# Patient Record
Sex: Female | Born: 1945
Health system: Southern US, Community
[De-identification: ages and names within clinical notes are randomized; demographics above are authoritative.]

## PROBLEM LIST (undated history)

## (undated) DIAGNOSIS — K922 Gastrointestinal hemorrhage, unspecified: Secondary | ICD-10-CM

## (undated) DIAGNOSIS — H3530 Unspecified macular degeneration: Secondary | ICD-10-CM

## (undated) DIAGNOSIS — H269 Unspecified cataract: Secondary | ICD-10-CM

## (undated) DIAGNOSIS — N289 Disorder of kidney and ureter, unspecified: Secondary | ICD-10-CM

## (undated) DIAGNOSIS — E785 Hyperlipidemia, unspecified: Secondary | ICD-10-CM

## (undated) DIAGNOSIS — C801 Malignant (primary) neoplasm, unspecified: Secondary | ICD-10-CM

## (undated) DIAGNOSIS — Z98891 History of uterine scar from previous surgery: Secondary | ICD-10-CM

## (undated) DIAGNOSIS — C189 Malignant neoplasm of colon, unspecified: Secondary | ICD-10-CM

## (undated) DIAGNOSIS — M1990 Unspecified osteoarthritis, unspecified site: Secondary | ICD-10-CM

## (undated) DIAGNOSIS — Z9089 Acquired absence of other organs: Secondary | ICD-10-CM

## (undated) DIAGNOSIS — I10 Essential (primary) hypertension: Secondary | ICD-10-CM

## (undated) HISTORY — DX: History of uterine scar from previous surgery: Z98.891

## (undated) HISTORY — PX: TONSILLECTOMY: SUR1361

## (undated) HISTORY — DX: Essential (primary) hypertension: I10

## (undated) HISTORY — DX: Gastrointestinal hemorrhage, unspecified: K92.2

## (undated) HISTORY — DX: Hyperlipidemia, unspecified: E78.5

## (undated) HISTORY — DX: Unspecified macular degeneration: H35.30

## (undated) HISTORY — PX: CARPAL TUNNEL RELEASE: SHX101

## (undated) HISTORY — DX: Acquired absence of other organs: Z90.89

## (undated) HISTORY — DX: Unspecified cataract: H26.9

## (undated) SURGERY — PARS PLANA VITRECTOMY WITH 25 GAUGE
Anesthesia: General | Laterality: Left

---

## 2002-03-26 ENCOUNTER — Other Ambulatory Visit: Admission: RE | Admit: 2002-03-26 | Discharge: 2002-03-26 | Payer: Self-pay | Admitting: Family Medicine

## 2017-12-07 NOTE — Progress Notes (Signed)
[name] Note  12/08/2017     CHIEF COMPLAINT Patient presents for Retina Evaluation and Spots and/or Floaters   HISTORY OF PRESENT ILLNESS: Gabrielle [name] is a 72 y.o. female who presents to the clinic today for:   HPI    Retina Evaluation    In both eyes.  This started 1 month ago.  Associated Symptoms Floaters, Glare and Photophobia.  Negative for Blind Spot, Shoulder/Hip pain, Distortion, Jaw Claudication, Fatigue, Weight Loss, Scalp Tenderness, Redness, Flashes, Pain, Trauma and Fever.  Context:  distance vision, mid-range vision and near vision.  Treatments tried include no treatments.  I, the attending physician,  performed the HPI with the patient and updated documentation appropriately.          Spots and/or Floaters    In both eyes.  Associated symptoms include photophobia.  Negative for flashes.  I, the attending physician,  performed the HPI with the patient and updated documentation appropriately.          Comments    Referral of Gabrielle [name] of [facility] [name] for evaluation of Mac Deg. Patient states she noticed occasional  Floaters and glare  about a week ago Ou . Pt states "I'M blind in Gabrielle right eye, everything is blurry". Pt reports she sees "a white ball " OU. Denies pain, flashes and trauma. Denies eye gtts/vits Pt reports she does not take any medications .         Last edited by Gabrielle [name] on 12/09/2017  8:21 PM. (History)    Pt states 1 month ago she began to notice a decrease in OU [place]; pt reports it "took awhile to get an appointment", pt states that she was seen yesterday; Pt states that she began to notice trouble seeing TV and the phone book; Pt reports family hx of ARMD; Pt denies ever having dx of ARMD; Pt denies DM dx;   Referring physician: Melina [name], [place], Gabrielle [name]  HISTORICAL INFORMATION:   Selected notes from the MEDICAL RECORD NUMBER Referred by Gabrielle [name] for concern of ARMD OU;  LEE-  01.03.19 Gabrielle [name]) [BCVA OD: 10/50 OS: 20/80] Ocular Hx- ARMD OU, DME PMH- elevated chol., HTN    CURRENT MEDICATIONS: No current outpatient medications on file. (Ophthalmic Drugs)   No current facility-administered medications for this visit.  (Ophthalmic Drugs)   No current outpatient medications on file. (Other)   Current Facility-Administered Medications (Other)  Medication Route  . Bevacizumab (AVASTIN) SOLN 1.25 mg Intravitreal      REVIEW OF SYSTEMS: ROS    Positive for: Eyes   Negative for: Constitutional, Gastrointestinal, Neurological, Skin, Genitourinary, Musculoskeletal, HENT, Endocrine, Cardiovascular, Respiratory, Psychiatric, Allergic/Imm, Heme/Lymph   Last edited by Gabrielle Jordan, Gabrielle [name] on 07/09/1323  4:01 AM. (History)       ALLERGIES Not on File  PAST MEDICAL HISTORY Past Medical History:  Diagnosis Date  . H/O cesarean section   . Hx of tonsillectomy    History reviewed. No pertinent surgical history.  FAMILY HISTORY Family History  Problem Relation Age of Onset  . Macular degeneration Mother   . Stroke Mother   . Hypertension Mother   . Cancer Father        lung  . Diabetes Sister   . Hypertension Sister     SOCIAL HISTORY Social History   Tobacco Use  . Smoking status: Never Smoker  . Smokeless tobacco: Never Used  Substance Use Topics  .  Alcohol use: No    Frequency: Never  . Drug use: No         OPHTHALMIC EXAM:  Base Eye Exam    Visual Acuity (Snellen - Linear)      Right Left   Dist cc 20/200 20/70 -1   Dist ph cc NI NI   Correction:  Glasses       Tonometry (Tonopen, 8:37 AM)      Right Left   Pressure 24 18       Tonometry #2 (Tonopen, 8:37 AM)      Right Left   Pressure 25 19       Pupils      Dark Light Shape React APD   Right 5 4 Round Slow None   Left 5 4 Round Slow None       Visual Fields (Counting fingers)      Left Right    Full    Restrictions  Central scotoma       Extraocular  Movement      Right Left    Full, Ortho Full, Ortho       Neuro/Psych    Oriented x3:  Yes   Mood/Affect:  Normal       Dilation    Both eyes:  1.0% Mydriacyl, 2.5% Phenylephrine @ 8:37 AM        Slit Lamp and Fundus Exam    Slit Lamp Exam      Right Left   Lids/Lashes Dermatochalasis - upper lid Dermatochalasis - upper lid   Conjunctiva/Sclera White and quiet White and quiet   Cornea Arcus, 1+ Punctate epithelial erosions Arcus, 2+ Punctate epithelial erosions   Anterior Chamber Deep and quiet Deep and quiet   Iris Round and well dilated Round and well dilated   Lens 2-3+ Nuclear sclerosis, 2+ Cortical cataract, trace Posterior subcapsular cataract centrally 2-3+ Nuclear sclerosis, 2+ Cortical cataract, trace Posterior subcapsular cataract centrally   Vitreous Vitreous syneresis, Posterior vitreous detachment Vitreous syneresis, Posterior vitreous detachment       Fundus Exam      Right Left   Disc Normal Normal   C/D Ratio 0.35 0.35   Macula Blunted foveal reflex, Drusen, Pigment clumping, central thickening, scattered Exudates Blunted foveal reflex, central thickening, hemorrhage along inferior boarder of thickening, RPE clumping, Drusen   Vessels Vascular attenuation Vascular attenuation   Periphery Attached, mild Reticular degeneration Attached, mild Reticular degeneration        Refraction    Wearing Rx      Sphere Cylinder Axis Add   Right -3.25 +1.00 158 +2.50   Left -3.50 +0.50 180 +2.50   Age:  8 yrs   Type:  PAL       Manifest Refraction (Over)      Sphere Cylinder Axis Dist VA   Right -2.25 +1.00 157 20/200   Left -2.50 +0.50 180 20/70          IMAGING AND PROCEDURES  Imaging and Procedures for 12/09/17  OCT, Retina - OU - Both Eyes     Right Eye Quality was good. Central Foveal Thickness: 591. Progression has no prior data. Findings include subretinal fluid, intraretinal fluid, pigment epithelial detachment, epiretinal membrane, subretinal  hyper-reflective material, retinal drusen .   Left Eye Quality was good. Central Foveal Thickness: 444. Progression has no prior data. Findings include subretinal hyper-reflective material, pigment epithelial detachment, subretinal fluid, intraretinal fluid, lamellar hole, retinal drusen .   Notes *Images captured and stored on  drive  Diagnosis / Impression:  Exudative AMD OU  Clinical management:  See below  Abbreviations: NFP - Normal foveal profile. CME - cystoid macular edema. PED - pigment epithelial detachment. IRF - intraretinal fluid. SRF - subretinal fluid. EZ - ellipsoid zone. ERM - epiretinal membrane. ORA - outer retinal atrophy. ORT - outer retinal tubulation. SRHM - subretinal hyper-reflective material        Intravitreal Injection, Pharmacologic Agent - OD - Right Eye     Time Out 12/08/2017. 9:55 AM. Confirmed correct patient, procedure, site, and patient consented.   Anesthesia Topical anesthesia was used. Anesthetic medications included Lidocaine 2%, Tetracaine 0.5%.   Procedure Preparation included 5% betadine to ocular surface, eyelid speculum. A supplied needle was used.   Injection: 1.25 mg Bevacizumab 1.65m/0.05ml   NDC: 523557-322-02   Lot: [id number]    Expiration Date: 01/09/2018   Route: Intravitreal   Site: Right Eye   Waste: 0 mg  Post-op Post injection exam found visual acuity of at least counting fingers. The patient tolerated the procedure well. There were no complications. The patient received written and verbal post procedure care education.                 ASSESSMENT/PLAN:    ICD-10-CM   1. Exudative age-related macular degeneration of both eyes with active choroidal neovascularization (HCC) H35.3231 Intravitreal Injection, Pharmacologic Agent - OD - Right Eye  2. Retinal edema H35.81 OCT, Retina - OU - Both Eyes    Intravitreal Injection, Pharmacologic Agent - OD - Right Eye    Bevacizumab (AVASTIN) SOLN 1.25 mg  3.  Posterior vitreous detachment of both eyes H43.813   4. Combined form of age-related cataract, both eyes H25.813     1,2. Exudative age related macular degeneration, both eyes.    - The incidence pathology and anatomy of wet AMD discussed   - The ANCHOR, MARINA, CATT and VIEW trials discussed with patient.    - discussed treatment options including observation vs intravitreal anti-VEGF agents such as Avastin, Lucentis, Eylea.    - Risks of endophthalmitis and vascular occlusive events and atrophic changes discussed with patient  - OCT shows SRF, IRF, PED OU consistent with active exudative ARMD OU with retinal edema  - recommend IVA OD today and IVA OS in 2 wks  - pt wishes to be treated with IVA OD, today (01.04.19)  - RBA of procedure discussed, questions answered  - informed consent obtained and signed  - see procedure note  - f/u in 2 wks for IVA OS  - if pt tolerates procedures well, will consider bilateral injections at subsequent follow ups  3. PVD / vitreous syneresis  Discussed findings and prognosis  No RT or RD on 360  exam  Reviewed s/s of RT/RD  Strict return precautions for any such RT/RD signs/symptoms  4. Combined form of age related cataract OU- - The symptoms of cataract, surgical options, and treatments and risks were discussed with patient. - discussed diagnosis and progression - not yet visually significant - monitor for now   Ophthalmic Meds Ordered this visit:  Meds ordered this encounter  Medications  . Bevacizumab (AVASTIN) SOLN 1.25 mg       Return in about 2 weeks (around 12/22/2017) for IVA OS only.  There are no Patient Instructions on file for this visit.   Explained the diagnoses, plan, and follow up with the patient and they expressed understanding.  Patient expressed understanding of the importance of proper follow  up care.   This document serves as a record of services personally performed by Gardiner Sleeper, MD, PhD. It was created on  their behalf by Catha Brow, [place], a certified ophthalmic assistant. The creation of this record is the provider's dictation and/or activities during the visit.  Electronically signed by: Catha Brow, COA  12/09/17 8:29 PM    Gardiner Sleeper, M.D., Ph.D. Diseases & Surgery of the Retina and Vitreous [facility] 12/09/17   I have reviewed the above documentation for accuracy and completeness, and I agree with the above. Gardiner Sleeper, M.D., Ph.D. 12/09/17 8:30 PM     Abbreviations: M myopia (nearsighted); A astigmatism; H hyperopia (farsighted); P presbyopia; Mrx spectacle prescription;  CTL contact lenses; OD right eye; OS left eye; OU both eyes  XT exotropia; ET esotropia; PEK punctate epithelial keratitis; PEE punctate epithelial erosions; DES dry eye syndrome; MGD meibomian gland dysfunction; ATs artificial tears; PFAT's preservative free artificial tears; [place] nuclear sclerotic cataract; PSC posterior subcapsular cataract; ERM epi-retinal membrane; PVD posterior vitreous detachment; RD retinal detachment; DM diabetes mellitus; [name] diabetic retinopathy; NPDR non-proliferative diabetic retinopathy; PDR proliferative diabetic retinopathy; CSME clinically significant macular edema; DME diabetic macular edema; dbh dot blot hemorrhages; CWS cotton wool spot; POAG primary open angle glaucoma; C/D cup-to-disc ratio; HVF humphrey visual field; GVF goldmann visual field; OCT optical coherence tomography; IOP intraocular pressure; BRVO Branch retinal vein occlusion; CRVO central retinal vein occlusion; CRAO central retinal artery occlusion; BRAO branch retinal artery occlusion; RT retinal tear; SB scleral buckle; PPV pars plana vitrectomy; VH Vitreous hemorrhage; PRP panretinal laser photocoagulation; IVK intravitreal kenalog; VMT vitreomacular traction; MH Macular hole;  NVD neovascularization of the disc; NVE neovascularization elsewhere; AREDS age related eye disease  study; ARMD age related macular degeneration; POAG primary open angle glaucoma; EBMD epithelial/anterior basement membrane dystrophy; ACIOL anterior chamber intraocular lens; IOL intraocular lens; PCIOL posterior chamber intraocular lens; Phaco/IOL phacoemulsification with intraocular lens placement; [place] photorefractive keratectomy; LASIK laser assisted in situ keratomileusis; HTN hypertension; DM diabetes mellitus; COPD chronic obstructive pulmonary disease

## 2017-12-08 ENCOUNTER — Encounter (INDEPENDENT_AMBULATORY_CARE_PROVIDER_SITE_OTHER): Payer: Self-pay | Admitting: Ophthalmology

## 2017-12-08 ENCOUNTER — Ambulatory Visit (INDEPENDENT_AMBULATORY_CARE_PROVIDER_SITE_OTHER): Payer: Medicare HMO | Admitting: Ophthalmology

## 2017-12-08 DIAGNOSIS — H25813 Combined forms of age-related cataract, bilateral: Secondary | ICD-10-CM | POA: Diagnosis not present

## 2017-12-08 DIAGNOSIS — H3581 Retinal edema: Secondary | ICD-10-CM | POA: Diagnosis not present

## 2017-12-08 DIAGNOSIS — H43813 Vitreous degeneration, bilateral: Secondary | ICD-10-CM

## 2017-12-08 DIAGNOSIS — H353231 Exudative age-related macular degeneration, bilateral, with active choroidal neovascularization: Secondary | ICD-10-CM | POA: Diagnosis not present

## 2017-12-08 MED ORDER — BEVACIZUMAB CHEMO INJECTION 1.25MG/0.05ML SYRINGE FOR KALEIDOSCOPE
1.2500 mg | INTRAVITREAL | Status: DC
  Administered 2017-12-08: 1.25 mg via INTRAVITREAL

## 2017-12-09 ENCOUNTER — Encounter (INDEPENDENT_AMBULATORY_CARE_PROVIDER_SITE_OTHER): Payer: Self-pay | Admitting: Ophthalmology

## 2017-12-21 NOTE — Progress Notes (Signed)
[facility] Note  12/22/2017     CHIEF COMPLAINT Patient presents for Retina Follow Up   HISTORY OF PRESENT ILLNESS: Gabrielle [name] is a 72 y.o. female who presents to the clinic today for:   HPI    Retina Follow Up    Patient presents with  Other.  In right eye.  This started 2 months ago.  Severity is mild.  Since onset it is stable.  I, the attending physician,  performed the HPI with the patient and updated documentation appropriately.          Comments    F/U EXU AMD OU. Patient states "when she watches t.v she feels like a bottle of light is in her vision". Light is constant in her OD. Pt reports she has glare OD when she has her glasses on.Denies flashes, floaters and ocular pain.  Denies Vits/ Gtts        Last edited by Gabrielle Caffey, MD on 12/22/2017  9:39 AM. (History)      Referring physician: No referring provider defined for this encounter.  HISTORICAL INFORMATION:   Selected notes from the MEDICAL RECORD NUMBER Referred by Dr. Cristela [name] for concern of ARMD OU;  LEE- 01.03.19 Gabrielle [name]) [BCVA OD: 10/50 OS: 20/80] Ocular Hx- ARMD OU, DME PMH- elevated chol., HTN    CURRENT MEDICATIONS: No current outpatient medications on file. (Ophthalmic Drugs)   No current facility-administered medications for this visit.  (Ophthalmic Drugs)   No current outpatient medications on file. (Other)   Current Facility-Administered Medications (Other)  Medication Route  . Bevacizumab (AVASTIN) SOLN 1.25 mg Intravitreal  . Bevacizumab (AVASTIN) SOLN 1.25 mg Intravitreal      REVIEW OF SYSTEMS: ROS    Positive for: Eyes   Negative for: Constitutional, Gastrointestinal, Neurological, Skin, Genitourinary, Musculoskeletal, HENT, Endocrine, Cardiovascular, Respiratory, Psychiatric, Allergic/Imm, Heme/Lymph   Last edited by Gabrielle Jordan, LPN on 7/61/9509  3:26 AM. (History)       ALLERGIES No Known Allergies  PAST MEDICAL HISTORY Past  Medical History:  Diagnosis Date  . H/O cesarean section   . Hx of tonsillectomy    History reviewed. No pertinent surgical history.  FAMILY HISTORY Family History  Problem Relation Age of Onset  . Macular degeneration Mother   . Stroke Mother   . Hypertension Mother   . Cancer Father        lung  . Diabetes Sister   . Hypertension Sister     SOCIAL HISTORY Social History   Tobacco Use  . Smoking status: Never Smoker  . Smokeless tobacco: Never Used  Substance Use Topics  . Alcohol use: No    Frequency: Never  . Drug use: No         OPHTHALMIC EXAM:  Base Eye Exam    Visual Acuity (Snellen - Linear)      Right Left   Dist cc 20/300 -2 20/100 -2   Dist ph cc NI NI   Correction:  Glasses       Tonometry (Tonopen, 8:33 AM)      Right Left   Pressure 22 19       Pupils      Dark Light Shape React APD   Right 5 4 Round Slow None   Left 5 4 Round Slow None       Visual Fields      Left Right    Full    Restrictions  Central scotoma  Extraocular Movement      Right Left    Full, Ortho Full, Ortho       Neuro/Psych    Oriented x3:  Yes   Mood/Affect:  Normal       Dilation    Both eyes:  1.0% Mydriacyl, 2.5% Phenylephrine @ 8:31 AM        Slit Lamp and Fundus Exam    Slit Lamp Exam      Right Left   Lids/Lashes Dermatochalasis - upper lid Dermatochalasis - upper lid   Conjunctiva/Sclera White and quiet White and quiet   Cornea Arcus, 1+ Punctate epithelial erosions Arcus, 2+ Punctate epithelial erosions   Anterior Chamber Deep and quiet Deep and quiet   Iris Round and well dilated Round and well dilated   Lens 2-3+ Nuclear sclerosis, 2+ Cortical cataract, trace Posterior subcapsular cataract centrally 2-3+ Nuclear sclerosis, 2+ Cortical cataract, trace Posterior subcapsular cataract centrally   Vitreous Vitreous syneresis, Posterior vitreous detachment Vitreous syneresis, Posterior vitreous detachment       Fundus Exam      Right  Left   Disc Normal Normal   C/D Ratio 0.35 0.35   Macula Blunted foveal reflex, Drusen, Pigment clumping, central thickening with mild interval improvement, scattered Exudates Blunted foveal reflex, central thickening slightly worse, hemorrhage along inferior boarder of thickening, RPE clumping, Drusen   Vessels Vascular attenuation Vascular attenuation   Periphery Attached, mild Reticular degeneration Attached, mild Reticular degeneration        Refraction    Wearing Rx      Sphere Cylinder Axis Add   Right -3.25 +1.00 158 +2.50   Left -3.50 +0.50 180 +2.50   Type:  PAL       Manifest Refraction      Dist VA   Right 20/300   Left 20/100+2          IMAGING AND PROCEDURES  Imaging and Procedures for 12/22/17  OCT, Retina - OU - Both Eyes     Right Eye Quality was good. Central Foveal Thickness: 532. Progression has improved. Findings include subretinal fluid, intraretinal fluid, pigment epithelial detachment, epiretinal membrane, subretinal hyper-reflective material, retinal drusen  (OD with interval improvement).   Left Eye Quality was good. Central Foveal Thickness: 521. Progression has worsened. Findings include subretinal hyper-reflective material, pigment epithelial detachment, subretinal fluid, intraretinal fluid, lamellar hole, retinal drusen  (OS interval worsening).   Notes *Images captured and stored on drive  Diagnosis / Impression:  Exudative AMD OU OD with interval improvement  OS interval worsening  Clinical management:  See below  Abbreviations: NFP - Normal foveal profile. CME - cystoid macular edema. PED - pigment epithelial detachment. IRF - intraretinal fluid. SRF - subretinal fluid. EZ - ellipsoid zone. ERM - epiretinal membrane. ORA - outer retinal atrophy. ORT - outer retinal tubulation. SRHM - subretinal hyper-reflective material        Intravitreal Injection, Pharmacologic Agent - OS - Left Eye     Time Out 12/22/2017. 9:53 AM. Confirmed  correct patient, procedure, site, and patient consented.   Anesthesia Topical anesthesia was used. Anesthetic medications included Tetracaine 0.5%, Lidocaine 2%.   Procedure Preparation included 5% betadine to ocular surface, eyelid speculum. A supplied needle was used.   Injection: 1.25 mg Bevacizumab 1.25mg /0.34ml   NDC: 29924-268-34    Lot: [id number]     Expiration Date: 01/08/2018   Route: Intravitreal   Site: Left Eye   Waste: 0 mg  Post-op Post injection exam found visual acuity of  at least counting fingers. The patient tolerated the procedure well. There were no complications. The patient received written and verbal post procedure care education.                 ASSESSMENT/PLAN:    ICD-10-CM   1. Exudative age-related macular degeneration of both eyes with active choroidal neovascularization (HCC) H35.3231 OCT, Retina - OU - Both Eyes    Intravitreal Injection, Pharmacologic Agent - OS - Left Eye    Bevacizumab (AVASTIN) SOLN 1.25 mg    CANCELED: Intravitreal Injection, Pharmacologic Agent - OD - Right Eye  2. Retinal edema H35.81 OCT, Retina - OU - Both Eyes  3. Posterior vitreous detachment of both eyes H43.813   4. Combined form of age-related cataract, both eyes H25.813     1,2. Exudative age related macular degeneration, both eyes.    - The incidence pathology and anatomy of wet AMD discussed   - The ANCHOR, MARINA, CATT and VIEW trials discussed with patient.    - discussed treatment options including observation vs intravitreal anti-VEGF agents such as Avastin, Lucentis, Eylea.    - Risks of endophthalmitis and vascular occlusive events and atrophic changes discussed with patient  - S/P IVA OD #1 (01.04.19)  - OCT today shows interval improvement OD, interval worsening of SRF, IRF, PED OS  - recommend IVA OS #1 today  - pt wishes to be treated with IVA OS, today (1.18.19)  - RBA of procedure discussed, questions answered  - informed consent obtained and  signed  - see procedure note  - f/u in 4 wks for IVA OU  3. PVD / vitreous syneresis  Discussed findings and prognosis  No RT or RD on 360  exam  Reviewed s/s of RT/RD  Strict return precautions for any such RT/RD signs/symptoms  4. Combined form of age related cataract OU- - The symptoms of cataract, surgical options, and treatments and risks were discussed with patient. - discussed diagnosis and progression - not yet visually significant - monitor for now   Ophthalmic Meds Ordered this visit:  Meds ordered this encounter  Medications  . Bevacizumab (AVASTIN) SOLN 1.25 mg       Return in about 4 weeks (around 01/19/2018) for Dilated Exam, OCT.  There are no Patient Instructions on file for this visit.   Explained the diagnoses, plan, and follow up with the patient and they expressed understanding.  Patient expressed understanding of the importance of proper follow up care.   This document serves as a record of services personally performed by Gardiner Sleeper, MD, PhD. It was created on their behalf by Catha Brow, [place], a certified ophthalmic assistant. The creation of this record is the provider's dictation and/or activities during the visit.  Electronically signed by: Catha Brow, COA  12/22/17 10:14 PM     Gardiner Sleeper, M.D., Ph.D. Diseases & Surgery of the Retina and [place] 12/22/17   I have reviewed the above documentation for accuracy and completeness, and I agree with the above. Gardiner Sleeper, M.D., Ph.D. 12/22/17 10:17 PM      Abbreviations: M myopia (nearsighted); A astigmatism; H hyperopia (farsighted); P presbyopia; Mrx spectacle prescription;  CTL contact lenses; OD right eye; OS left eye; OU both eyes  XT exotropia; ET esotropia; PEK punctate epithelial keratitis; PEE punctate epithelial erosions; DES dry eye syndrome; MGD meibomian gland dysfunction; ATs artificial tears; PFAT's preservative free  artificial tears; [place] nuclear sclerotic cataract; [place]  posterior subcapsular cataract; ERM epi-retinal membrane; PVD posterior vitreous detachment; RD retinal detachment; DM diabetes mellitus; DR diabetic retinopathy; NPDR non-proliferative diabetic retinopathy; PDR proliferative diabetic retinopathy; CSME clinically significant macular edema; DME diabetic macular edema; dbh dot blot hemorrhages; CWS cotton wool spot; POAG primary open angle glaucoma; C/D cup-to-disc ratio; HVF humphrey visual field; GVF goldmann visual field; OCT optical coherence tomography; IOP intraocular pressure; BRVO Branch retinal vein occlusion; CRVO central retinal vein occlusion; CRAO central retinal artery occlusion; BRAO branch retinal artery occlusion; RT retinal tear; SB scleral buckle; PPV pars plana vitrectomy; VH Vitreous hemorrhage; PRP panretinal laser photocoagulation; IVK intravitreal kenalog; VMT vitreomacular traction; MH Macular hole;  NVD neovascularization of the disc; NVE neovascularization elsewhere; AREDS age related eye disease study; ARMD age related macular degeneration; POAG primary open angle glaucoma; EBMD epithelial/anterior basement membrane dystrophy; ACIOL anterior chamber intraocular lens; IOL intraocular lens; PCIOL posterior chamber intraocular lens; Phaco/IOL phacoemulsification with intraocular lens placement; [name] photorefractive keratectomy; LASIK laser assisted in situ keratomileusis; HTN hypertension; DM diabetes mellitus; COPD chronic obstructive pulmonary disease

## 2017-12-22 ENCOUNTER — Ambulatory Visit (INDEPENDENT_AMBULATORY_CARE_PROVIDER_SITE_OTHER): Payer: Medicare HMO | Admitting: Ophthalmology

## 2017-12-22 ENCOUNTER — Encounter (INDEPENDENT_AMBULATORY_CARE_PROVIDER_SITE_OTHER): Payer: Self-pay | Admitting: Ophthalmology

## 2017-12-22 DIAGNOSIS — H353231 Exudative age-related macular degeneration, bilateral, with active choroidal neovascularization: Secondary | ICD-10-CM

## 2017-12-22 DIAGNOSIS — H25813 Combined forms of age-related cataract, bilateral: Secondary | ICD-10-CM

## 2017-12-22 DIAGNOSIS — H3581 Retinal edema: Secondary | ICD-10-CM

## 2017-12-22 DIAGNOSIS — H43813 Vitreous degeneration, bilateral: Secondary | ICD-10-CM

## 2017-12-22 MED ORDER — BEVACIZUMAB CHEMO INJECTION 1.25MG/0.05ML SYRINGE FOR KALEIDOSCOPE
1.2500 mg | INTRAVITREAL | Status: DC
  Administered 2017-12-22: 1.25 mg via INTRAVITREAL

## 2018-01-02 ENCOUNTER — Encounter: Payer: Self-pay | Admitting: *Deleted

## 2018-01-03 ENCOUNTER — Ambulatory Visit (INDEPENDENT_AMBULATORY_CARE_PROVIDER_SITE_OTHER): Payer: Medicare HMO | Admitting: Pediatrics

## 2018-01-03 ENCOUNTER — Encounter: Payer: Self-pay | Admitting: Pediatrics

## 2018-01-03 VITALS — BP 211/111 | HR 85 | Temp 97.9°F | Ht 62.0 in | Wt 160.2 lb

## 2018-01-03 DIAGNOSIS — I10 Essential (primary) hypertension: Secondary | ICD-10-CM

## 2018-01-03 DIAGNOSIS — H3530 Unspecified macular degeneration: Secondary | ICD-10-CM | POA: Diagnosis not present

## 2018-01-03 DIAGNOSIS — E785 Hyperlipidemia, unspecified: Secondary | ICD-10-CM | POA: Diagnosis not present

## 2018-01-03 LAB — BAYER DCA HB A1C WAIVED: HB A1C (BAYER DCA - WAIVED): 6 %

## 2018-01-03 MED ORDER — LISINOPRIL 10 MG PO TABS: 10.0000 mg | ORAL_TABLET | Freq: Every day | ORAL | 3 refills | Status: DC

## 2018-01-03 NOTE — Progress Notes (Signed)
Subjective:   Patient ID: Gabrielle [name], female    DOB: [birth year], 72 y.o.   MRN: [id number] CC: New Patient (Initial Visit) follow up multiple med problems HPI: Gabrielle [name] is a 72 y.o. female presenting for New Patient (Initial Visit)  Patient here to establish care and follow-up multiple medical problems. Has not seen a primary care doctor for years.  Saw an eye doctor 2 weeks ago.  Diagnosed with macular degeneration.  Patient says her R eye is worse than left.  Cannot read signs, cannot read noticed on the back of the clinic door.  Patient is not driving now. Cant read without a magnifying glass.   Here today with her sister.  HTN: was on medicines years ago, 10 or more years ago.  Not taking anything now.  She does have headaches some days, general headaches, not one place more than another.  Last for a few hours and go away.  Has had trouble with her vision for the last 2-3 months, no recent worsening.  No chest pain, no chest pressure.  No numbness or tingling, no weakness in her arms or legs.  Sister is a blood pressure cuff at home.  They have not been checking patient's blood pressure.  HLD: on medicines about 10 yrs  Family history of diabetes, thyroid problems and hypertension.  Past Medical History:  Diagnosis Date  . H/O cesarean section   . Hx of tonsillectomy   . Hyperlipidemia   . Hypertension    Family History  Problem Relation Age of Onset  . Macular degeneration Mother   . Stroke Mother   . Hypertension Mother   . Cancer Father        lung  . Diabetes Sister   . Hypertension Sister    Social History   Socioeconomic History  . Marital status: Divorced    Spouse name: None  . Number of children: None  . Years of education: None  . Highest education level: None  Social Needs  . Financial resource strain: None  . Food insecurity - worry: None  . Food insecurity - inability: None  . Transportation needs - medical: None  . Transportation needs -  non-medical: None  Occupational History  . None  Tobacco Use  . Smoking status: Never Smoker  . Smokeless tobacco: Never Used  Substance and Sexual Activity  . Alcohol use: No    Frequency: Never  . Drug use: No  . Sexual activity: Not Currently  Other Topics Concern  . None  Social History Narrative  . None   ROS: All systems negative other than what is in HPI  Objective:    BP (!) 211/111   Pulse 85   Temp 97.9 F (36.6 C) (Oral)   Ht _0  (1.575 m)   Wt 160 lb 3.2 oz (72.7 kg)   BMI 29.30 kg/m   Wt Readings from Last 3 Encounters:  01/03/18 160 lb 3.2 oz (72.7 kg)    Gen: NAD, alert, cooperative with exam, NCAT EYES: EOMI, no conjunctival injection, or no icterus ENT:  TMs pearly gray b/l, OP without erythema LYMPH: no cervical LAD CV: NRRR, normal S1/S2, no murmur, distal pulses 2+ b/l Resp: CTABL, no wheezes, normal WOB Abd: +BS, soft, NTND. no guarding or organomegaly Ext: No edema, warm Neuro: Alert and oriented, strength 5/5 b/l hand grip, knee flex/ext. Face symmetric. Sensation intact. MSK: normal muscle bulk  Assessment & Plan:  [name] was seen today for new patient (  initial visit) f/u med problems.  Diagnoses and all orders for this visit:  Essential hypertension Asymptomatic.  Very elevated.  Will start lisinopril 10 mg today, patient to take tomorrow and come to clinic for blood pressure recheck.  Return precautions discussed at length including any headaches, chest pain, worsening vision, stroke symptoms needs to be seen immediately. -     CMP14+EGFR -     TSH -     Bayer DCA Hb A1c Waived -     Lipid panel -     lisinopril (PRINIVIL,ZESTRIL) 10 MG tablet; Take 1 tablet (10 mg total) by mouth daily.  Hyperlipidemia, unspecified hyperlipidemia type Repeat blood work, fasting today.  Macular degeneration of both eyes, unspecified type Continue follow-up with ophthalmologist  Follow up plan: Return in about 1 day (around 01/04/2018). Gabrielle Found, MD [place]

## 2018-01-04 ENCOUNTER — Ambulatory Visit (INDEPENDENT_AMBULATORY_CARE_PROVIDER_SITE_OTHER): Payer: Medicare HMO | Admitting: *Deleted

## 2018-01-04 VITALS — BP 166/78 | HR 81

## 2018-01-04 DIAGNOSIS — Z0130 Encounter for examination of blood pressure without abnormal findings: Secondary | ICD-10-CM

## 2018-01-04 LAB — LIPID PANEL
CHOLESTEROL TOTAL: 252 mg/dL — AB (ref 100–199)
Chol/HDL Ratio: 5.6 ratio — ABNORMAL HIGH (ref 0.0–4.4)
HDL: 45 mg/dL (ref >39)
LDL Calculated: 156 mg/dL — ABNORMAL HIGH (ref 0–99)
TRIGLYCERIDES: 257 mg/dL — AB (ref 0–149)
VLDL Cholesterol Cal: 51 mg/dL — ABNORMAL HIGH (ref 5–40)

## 2018-01-04 LAB — CMP14+EGFR
ALBUMIN: 4.4 g/dL (ref 3.5–4.8)
ALK PHOS: 82 IU/L (ref 39–117)
ALT: 8 IU/L (ref 0–32)
AST: 6 IU/L (ref 0–40)
Albumin/Globulin Ratio: 1.6 (ref 1.2–2.2)
BILIRUBIN TOTAL: 0.3 mg/dL (ref 0.0–1.2)
BUN/Creatinine Ratio: 12 (ref 12–28)
BUN: 13 mg/dL (ref 8–27)
CHLORIDE: 100 mmol/L (ref 96–106)
CO2: 27 mmol/L (ref 20–29)
CREATININE: 1.06 mg/dL — AB (ref 0.57–1.00)
Calcium: 9.6 mg/dL (ref 8.7–10.3)
GFR calc non Af Amer: 53 mL/min/{1.73_m2} — ABNORMAL LOW (ref >59)
GFR, EST AFRICAN AMERICAN: 61 mL/min/{1.73_m2} (ref >59)
GLUCOSE: 110 mg/dL — AB (ref 65–99)
Globulin, Total: 2.7 g/dL (ref 1.5–4.5)
Potassium: 4.5 mmol/L (ref 3.5–5.2)
Sodium: 142 mmol/L (ref 134–144)
TOTAL PROTEIN: 7.1 g/dL (ref 6.0–8.5)

## 2018-01-04 LAB — TSH: TSH: 1.48 u[IU]/mL (ref 0.450–4.500)

## 2018-01-04 NOTE — Progress Notes (Signed)
Pt here for blood pressure check Per Dr Evette Doffing, pt needs to continue current meds and return tomorrow for rck of BP

## 2018-01-05 ENCOUNTER — Ambulatory Visit (INDEPENDENT_AMBULATORY_CARE_PROVIDER_SITE_OTHER): Payer: Medicare HMO | Admitting: *Deleted

## 2018-01-05 VITALS — BP 189/86 | HR 82

## 2018-01-05 DIAGNOSIS — Z0130 Encounter for examination of blood pressure without abnormal findings: Secondary | ICD-10-CM

## 2018-01-05 NOTE — Progress Notes (Signed)
BP check Increase Lisinopril to 20mg  per Dr Evette Doffing Rck at [facility] next week

## 2018-01-10 ENCOUNTER — Ambulatory Visit (INDEPENDENT_AMBULATORY_CARE_PROVIDER_SITE_OTHER): Payer: Medicare HMO | Admitting: Pediatrics

## 2018-01-10 ENCOUNTER — Encounter: Payer: Self-pay | Admitting: Pediatrics

## 2018-01-10 VITALS — BP 204/105 | HR 80 | Temp 97.4°F | Ht 62.0 in | Wt 161.0 lb

## 2018-01-10 DIAGNOSIS — E785 Hyperlipidemia, unspecified: Secondary | ICD-10-CM | POA: Diagnosis not present

## 2018-01-10 DIAGNOSIS — R7303 Prediabetes: Secondary | ICD-10-CM | POA: Diagnosis not present

## 2018-01-10 DIAGNOSIS — I10 Essential (primary) hypertension: Secondary | ICD-10-CM

## 2018-01-10 MED ORDER — PRAVASTATIN SODIUM 40 MG PO TABS: 40.0000 mg | ORAL_TABLET | Freq: Every day | ORAL | 3 refills | Status: DC

## 2018-01-10 MED ORDER — LISINOPRIL 40 MG PO TABS
40.0000 mg | ORAL_TABLET | Freq: Every day | ORAL | 3 refills | Status: DC
Start: 2018-01-10 — End: 2018-01-25

## 2018-01-10 NOTE — Progress Notes (Signed)
  Subjective:   Patient ID: Gabrielle [name], female    DOB: 09-02-1946, 72 y.o.   MRN: [id number] CC: Blood Pressure Check  HPI: Gabrielle [name] is a 72 y.o. female presenting for Blood Pressure Check here today with her sister  HTN: taking lisinopril daily, already took it this morning Last week BP SBPs down to 160s-180s Not having any chest pain, no numbness or weakness  HLD: not currently on medicine  Prediabetes: drinking small sodas at home, hasnt had any past few days Does like snacks and sugary foods at times  Relevant past medical, surgical, family and social history reviewed. Allergies and medications reviewed and updated. Social History   Tobacco Use  Smoking Status Never Smoker  Smokeless Tobacco Never Used   ROS: Per HPI   Objective:    BP (!) 204/105   Pulse 80   Temp (!) 97.4 F (36.3 C) (Oral)   Ht 5\' 2"  (1.575 m)   Wt 161 lb (73 kg)   BMI 29.45 kg/m   Wt Readings from Last 3 Encounters:  01/10/18 161 lb (73 kg)  01/03/18 160 lb 3.2 oz (72.7 kg)    Gen: NAD, alert, cooperative with exam, NCAT EYES: EOMI, no conjunctival injection, or no icterus ENT:   OP without erythema LYMPH: no cervical LAD CV: NRRR, normal S1/S2, no murmur, distal pulses 2+ b/l Resp: CTABL, no wheezes, normal WOB Abd: +BS, soft, NTND.  Ext: No edema, warm Neuro: Alert and oriented, strength equal b/l UE and LE, coordination grossly normal MSK: normal muscle bulk  Assessment & Plan:  Gabrielle [name] was seen today for blood pressure check.  Diagnoses and all orders for this visit:  Essential hypertension With initially very elevated Bps, attempted last week to decrease BP slowly, continues to have very elevated blood pressure. Slight HA but otherwise asymptomatic. Increase lisinopril to 40mg , pt to go home and take 2 more 10 mg tabs today, rtc for BP nurse visit Friday, likely will add amlodipine -     lisinopril (PRINIVIL,ZESTRIL) 40 MG tablet; Take 1 tablet (40 mg total) by mouth  daily.  Hyperlipidemia, unspecified hyperlipidemia type Start below -     pravastatin (PRAVACHOL) 40 MG tablet; Take 1 tablet (40 mg total) by mouth daily.  Pre-diabetes A1c 6.0, discussed lifestyle changes, increasing fruit/veg, decreasing processed food and soda intake  Follow up plan: Nurse visit 2 days, f/u with Dr Evette Doffing 2 weeks for lab work Assunta Found, MD [place]

## 2018-01-12 ENCOUNTER — Ambulatory Visit (INDEPENDENT_AMBULATORY_CARE_PROVIDER_SITE_OTHER): Payer: Medicare HMO | Admitting: *Deleted

## 2018-01-12 VITALS — BP 225/99 | HR 74

## 2018-01-12 DIAGNOSIS — I10 Essential (primary) hypertension: Secondary | ICD-10-CM

## 2018-01-12 MED ORDER — AMLODIPINE BESYLATE 5 MG PO TABS: 5.0000 mg | ORAL_TABLET | Freq: Every day | ORAL | 0 refills | Status: DC

## 2018-01-12 NOTE — Progress Notes (Signed)
Pt's BP readings today BP 228 95 P 78  Had pt relax x 15 minutes Recheck BP 225 99 P 74  Dr Evette Doffing reviewed readings Added Amlodipine 5mg  Pt will return on Monday for bp ck Precautions reviewed with pt  Pt verbalizes understanding

## 2018-01-15 ENCOUNTER — Encounter: Payer: Self-pay | Admitting: *Deleted

## 2018-01-15 ENCOUNTER — Ambulatory Visit (INDEPENDENT_AMBULATORY_CARE_PROVIDER_SITE_OTHER): Payer: Medicare HMO | Admitting: *Deleted

## 2018-01-15 VITALS — BP 164/80 | HR 80

## 2018-01-15 DIAGNOSIS — I10 Essential (primary) hypertension: Secondary | ICD-10-CM

## 2018-01-15 NOTE — Progress Notes (Signed)
Pt here for BP ck BP 177 77 P 81  RCK BP 164 80 P 80  Pt will return on Thursday for rck

## 2018-01-15 NOTE — Progress Notes (Signed)
Can you have her take two amlodipine, total of 10 mg daily? Can take two of what she has at home and send in #30 tabs 10mg  amlodipine with 2 refills for when she runs out of 5mg ?

## 2018-01-17 ENCOUNTER — Telehealth: Payer: Self-pay | Admitting: *Deleted

## 2018-01-17 MED ORDER — AMLODIPINE BESYLATE 10 MG PO TABS
10.0000 mg | ORAL_TABLET | Freq: Every day | ORAL | 2 refills | Status: DC
Start: 2018-01-17 — End: 2018-04-13

## 2018-01-17 NOTE — Telephone Encounter (Signed)
Pt notified of recommendation Verbalizes understanding Will come in on Thursday for BP ck

## 2018-01-17 NOTE — Telephone Encounter (Signed)
-----   Message from Eustaquio Maize, MD sent at 01/15/2018  2:16 PM EST -----   ----- Message ----- From: Marin Olp Sent: 01/15/2018   9:59 AM To: Eustaquio Maize, MD  BP check

## 2018-01-18 ENCOUNTER — Ambulatory Visit (INDEPENDENT_AMBULATORY_CARE_PROVIDER_SITE_OTHER): Payer: Medicare HMO | Admitting: *Deleted

## 2018-01-18 VITALS — BP 172/80 | HR 74

## 2018-01-18 DIAGNOSIS — Z0130 Encounter for examination of blood pressure without abnormal findings: Secondary | ICD-10-CM

## 2018-01-18 NOTE — Progress Notes (Signed)
Pt here for BP check BP 202 87 P 76  Recheck BP BP 172 80 P 74  Pt did not increase Amlodipine until this AM Pt is currently taking Amlodipine 10mg  and Lisinopril 40mg  Pt will return on Monday for BP rck Has appt with Dr Evette Doffing on 01/25/2018

## 2018-01-18 NOTE — Progress Notes (Signed)
[facility] Note  01/19/2018     CHIEF COMPLAINT Patient presents for Retina Follow Up   HISTORY OF PRESENT ILLNESS: Gabrielle [name] is a 72 y.o. female who presents to the clinic today for:   HPI    Retina Follow Up    Patient presents with  Wet AMD.  In both eyes.  This started 1 month ago.  Severity is mild.  Since onset it is gradually improving.  I, the attending physician,  performed the HPI with the patient and updated documentation appropriately.          Comments    F/U EXU ARMD OU. Patient states her vision  Blurred mostly at a distance but, is slowly improving. Pt reports her Bp was increased. Denies gtts/ vits        Last edited by Bernarda Caffey, MD on 01/19/2018  9:18 AM. (History)    Pt states OU VA is stable; Pt reports she has started on BP and cholesterol med;   Referring physician: No referring provider defined for this encounter.  HISTORICAL INFORMATION:   Selected notes from the MEDICAL RECORD NUMBER Referred by Dr. Cristela Blue for concern of ARMD OU;  LEE- 01.03.19 Cristela Blue) [BCVA OD: 10/50 OS: 20/80] Ocular Hx- ARMD OU, DME PMH- elevated chol., HTN    CURRENT MEDICATIONS: No current outpatient medications on file. (Ophthalmic Drugs)   No current facility-administered medications for this visit.  (Ophthalmic Drugs)   Current Outpatient Medications (Other)  Medication Sig  . amLODipine (NORVASC) 10 MG tablet Take 1 tablet (10 mg total) by mouth daily.  Marland Kitchen lisinopril (PRINIVIL,ZESTRIL) 40 MG tablet Take 1 tablet (40 mg total) by mouth daily.  . pravastatin (PRAVACHOL) 40 MG tablet Take 1 tablet (40 mg total) by mouth daily.   Current Facility-Administered Medications (Other)  Medication Route  . Bevacizumab (AVASTIN) SOLN 1.25 mg Intravitreal  . Bevacizumab (AVASTIN) SOLN 1.25 mg Intravitreal      REVIEW OF SYSTEMS: ROS    Positive for: Eyes   Negative for: Constitutional, Gastrointestinal, Neurological, Skin,  Genitourinary, Musculoskeletal, HENT, Endocrine, Cardiovascular, Respiratory, Psychiatric, Allergic/Imm, Heme/Lymph   Last edited by Zenovia Jordan, LPN on 5/39/7673  4:19 AM. (History)       ALLERGIES No Known Allergies  PAST MEDICAL HISTORY Past Medical History:  Diagnosis Date  . H/O cesarean section   . Hx of tonsillectomy   . Hyperlipidemia   . Hypertension    History reviewed. No pertinent surgical history.  FAMILY HISTORY Family History  Problem Relation Age of Onset  . Macular degeneration Mother   . Stroke Mother   . Hypertension Mother   . Cancer Father        lung  . Diabetes Sister   . Hypertension Sister     SOCIAL HISTORY Social History   Tobacco Use  . Smoking status: Never Smoker  . Smokeless tobacco: Never Used  Substance Use Topics  . Alcohol use: No    Frequency: Never  . Drug use: No         OPHTHALMIC EXAM:  Base Eye Exam    Visual Acuity (Snellen - Linear)      Right Left   Dist cc 20/200 20/150   Dist ph cc NI NI   Correction:  Glasses       Tonometry (Tonopen, 8:41 AM)      Right Left   Pressure 20 22       Pupils  Dark Light Shape React APD   Right 5 4 Round Slow None   Left 5 4 Round Slow None       Visual Fields (Counting fingers)      Left Right    Full    Restrictions  Central scotoma       Extraocular Movement      Right Left    Full, Ortho Full, Ortho       Neuro/Psych    Oriented x3:  Yes   Mood/Affect:  Normal       Dilation    Both eyes:  1.0% Mydriacyl, Paremyd @ 8:41 AM        Slit Lamp and Fundus Exam    Slit Lamp Exam      Right Left   Lids/Lashes Dermatochalasis - upper lid Dermatochalasis - upper lid   Conjunctiva/Sclera White and quiet White and quiet   Cornea Arcus, 1+ Punctate epithelial erosions Arcus, 2+ Punctate epithelial erosions   Anterior Chamber Deep and quiet Deep and quiet   Iris Round and well dilated Round and well dilated   Lens 2-3+ Nuclear sclerosis, 2+  Cortical cataract, trace Posterior subcapsular cataract centrally 2-3+ Nuclear sclerosis, 2+ Cortical cataract, trace Posterior subcapsular cataract centrally   Vitreous Vitreous syneresis, Posterior vitreous detachment Vitreous syneresis, Posterior vitreous detachment       Fundus Exam      Right Left   Disc Normal Normal   C/D Ratio 0.35 0.35   Macula Blunted foveal reflex, Drusen, Pigment clumping, central thickening, scattered Exudates, +PED Blunted foveal reflex, central thickening, hemorrhage along inferior border of thickening, RPE clumping, Drusen, RPE rip with surrounding heme slightly improved   Vessels Vascular attenuation Vascular attenuation   Periphery Attached, mild Reticular degeneration Attached, mild Reticular degeneration          IMAGING AND PROCEDURES  Imaging and Procedures for 01/19/18  OCT, Retina - OU - Both Eyes     Right Eye Quality was good. Central Foveal Thickness: 640. Progression has worsened. Findings include subretinal fluid, intraretinal fluid, pigment epithelial detachment, epiretinal membrane, subretinal hyper-reflective material, retinal drusen  (OD with interval worsening).   Left Eye Quality was good. Central Foveal Thickness: 424. Progression has improved. Findings include subretinal hyper-reflective material, pigment epithelial detachment, subretinal fluid, intraretinal fluid, retinal drusen  (OS interval improvement in IRF and [facility]).   Notes *Images captured and stored on drive  Diagnosis / Impression:  Exudative AMD OU OD with interval worsening OS interval improvement  Clinical management:  See below  Abbreviations: NFP - Normal foveal profile. CME - cystoid macular edema. PED - pigment epithelial detachment. IRF - intraretinal fluid. SRF - subretinal fluid. EZ - ellipsoid zone. ERM - epiretinal membrane. ORA - outer retinal atrophy. ORT - outer retinal tubulation. SRHM - subretinal hyper-reflective material        Intravitreal  Injection, Pharmacologic Agent - OD - Right Eye     Time Out 01/19/2018. 9:44 AM. Confirmed correct patient, procedure, site, and patient consented.   Anesthesia Topical anesthesia was used. Anesthetic medications included Lidocaine 2%, Tetracaine 0.5%.   Procedure Preparation included 5% betadine to ocular surface, eyelid speculum. A supplied needle was used.   Injection: 1.25 mg Bevacizumab 1.25mg /0.42ml   NDC: 29937-169-67    Lot: [id number]     Expiration Date: 02/21/2018   Route: Intravitreal   Site: Right Eye   Waste: 0 mg  Post-op Post injection exam found visual acuity of at least counting fingers. The patient tolerated  the procedure well. There were no complications. The patient received written and verbal post procedure care education.        Intravitreal Injection, Pharmacologic Agent - OS - Left Eye     Time Out 01/19/2018. 9:45 AM. Confirmed correct patient, procedure, site, and patient consented.   Anesthesia Topical anesthesia was used. Anesthetic medications included Tetracaine 0.5%, Lidocaine 2%.   Procedure Preparation included 5% betadine to ocular surface, eyelid speculum. A supplied needle was used.   Injection: 1.25 mg Bevacizumab 1.25mg /0.20ml   NDC: 34193-790-24    Lot: [id number]    Expiration Date: 03/19/2018   Route: Intravitreal   Site: Left Eye   Waste: 0 mg  Post-op Post injection exam found visual acuity of at least counting fingers. The patient tolerated the procedure well. There were no complications. The patient received written and verbal post procedure care education.                 ASSESSMENT/PLAN:    ICD-10-CM   1. Exudative age-related macular degeneration of both eyes with active choroidal neovascularization (HCC) H35.3231 Intravitreal Injection, Pharmacologic Agent - OD - Right Eye    Intravitreal Injection, Pharmacologic Agent - OS - Left Eye    Bevacizumab (AVASTIN) SOLN 1.25 mg    Bevacizumab (AVASTIN) SOLN 1.25  mg  2. Retinal edema H35.81 OCT, Retina - OU - Both Eyes  3. Posterior vitreous detachment of both eyes H43.813   4. Combined form of age-related cataract, both eyes H25.813     1,2. Exudative age related macular degeneration, both eyes.    - The incidence pathology and anatomy of wet AMD discussed   - The ANCHOR, MARINA, CATT and VIEW trials discussed with patient.    - discussed treatment options including observation vs intravitreal anti-VEGF agents such as Avastin, Lucentis, Eylea.    - Risks of endophthalmitis and vascular occlusive events and atrophic changes discussed with patient  - S/P IVA OD #1 (01.04.19) -- now 6 wks  - S/P IVA OS #1 (01.18.19) -- 4 wks  - OCT today shows interval improvement OD, interval worsening of SRF, IRF, PED OS  - recommend IVA OU #2 today -- to get eyes on same treatment schedule  - pt wishes to be treated with IVA OU, today (02.15.19)  - RBA of procedure discussed, questions answered  - informed consent obtained and signed  - see procedure note  - will have pt fill out Eylea paperwork today  - f/u in 4 wks for DFE/OCT/possible injeciton  3. PVD / vitreous syneresis  Discussed findings and prognosis  No RT or RD on 360  exam  Reviewed s/s of RT/RD  Strict return precautions for any such RT/RD signs/symptoms  4. Combined form of age related cataract OU- - The symptoms of cataract, surgical options, and treatments and risks were discussed with patient. - discussed diagnosis and progression - not yet visually significant - monitor for now   Ophthalmic Meds Ordered this visit:  Meds ordered this encounter  Medications  . Bevacizumab (AVASTIN) SOLN 1.25 mg  . Bevacizumab (AVASTIN) SOLN 1.25 mg       Return in about 4 weeks (around 02/16/2018) for F/U Exu AMD OU.  There are no Patient Instructions on file for this visit.   Explained the diagnoses, plan, and follow up with the patient and they expressed understanding.  Patient expressed  understanding of the importance of proper follow up care.   This document serves as a record of services  personally performed by Gardiner Sleeper, MD, PhD. It was created on their behalf by Catha Brow, [place], a certified ophthalmic assistant. The creation of this record is the provider's dictation and/or activities during the visit.  Electronically signed by: Catha Brow, COA  01/19/18 11:22 AM   Gardiner Sleeper, M.D., Ph.D. Diseases & Surgery of the Retina and [place] 01/19/18   I have reviewed the above documentation for accuracy and completeness, and I agree with the above. Gardiner Sleeper, M.D., Ph.D. 01/19/18 11:22 AM    Abbreviations: M myopia (nearsighted); A astigmatism; H hyperopia (farsighted); P presbyopia; Mrx spectacle prescription;  CTL contact lenses; OD right eye; OS left eye; OU both eyes  XT exotropia; ET esotropia; PEK punctate epithelial keratitis; PEE punctate epithelial erosions; DES dry eye syndrome; MGD meibomian gland dysfunction; ATs artificial tears; PFAT's preservative free artificial tears; [place] nuclear sclerotic cataract; PSC posterior subcapsular cataract; ERM epi-retinal membrane; PVD posterior vitreous detachment; RD retinal detachment; DM diabetes mellitus; DR diabetic retinopathy; NPDR non-proliferative diabetic retinopathy; PDR proliferative diabetic retinopathy; CSME clinically significant macular edema; DME diabetic macular edema; dbh dot blot hemorrhages; CWS cotton wool spot; POAG primary open angle glaucoma; C/D cup-to-disc ratio; HVF humphrey visual field; GVF goldmann visual field; OCT optical coherence tomography; IOP intraocular pressure; BRVO Branch retinal vein occlusion; CRVO central retinal vein occlusion; CRAO central retinal artery occlusion; BRAO branch retinal artery occlusion; RT retinal tear; SB scleral buckle; PPV pars plana vitrectomy; VH Vitreous hemorrhage; PRP panretinal laser photocoagulation; IVK  intravitreal kenalog; VMT vitreomacular traction; MH Macular hole;  NVD neovascularization of the disc; NVE neovascularization elsewhere; AREDS age related eye disease study; ARMD age related macular degeneration; POAG primary open angle glaucoma; EBMD epithelial/anterior basement membrane dystrophy; ACIOL anterior chamber intraocular lens; IOL intraocular lens; PCIOL posterior chamber intraocular lens; Phaco/IOL phacoemulsification with intraocular lens placement; [place] photorefractive keratectomy; LASIK laser assisted in situ keratomileusis; HTN hypertension; DM diabetes mellitus; COPD chronic obstructive pulmonary disease

## 2018-01-19 ENCOUNTER — Ambulatory Visit (INDEPENDENT_AMBULATORY_CARE_PROVIDER_SITE_OTHER): Payer: Medicare HMO | Admitting: Ophthalmology

## 2018-01-19 ENCOUNTER — Encounter (INDEPENDENT_AMBULATORY_CARE_PROVIDER_SITE_OTHER): Payer: Self-pay | Admitting: Ophthalmology

## 2018-01-19 DIAGNOSIS — H3581 Retinal edema: Secondary | ICD-10-CM

## 2018-01-19 DIAGNOSIS — H25813 Combined forms of age-related cataract, bilateral: Secondary | ICD-10-CM

## 2018-01-19 DIAGNOSIS — H43813 Vitreous degeneration, bilateral: Secondary | ICD-10-CM

## 2018-01-19 DIAGNOSIS — H353231 Exudative age-related macular degeneration, bilateral, with active choroidal neovascularization: Secondary | ICD-10-CM | POA: Diagnosis not present

## 2018-01-19 MED ORDER — BEVACIZUMAB CHEMO INJECTION 1.25MG/0.05ML SYRINGE FOR KALEIDOSCOPE
1.2500 mg | INTRAVITREAL | Status: DC
Start: 2018-01-19 — End: 2019-10-11
  Administered 2018-01-19: 1.25 mg via INTRAVITREAL

## 2018-01-22 ENCOUNTER — Ambulatory Visit (INDEPENDENT_AMBULATORY_CARE_PROVIDER_SITE_OTHER): Payer: Medicare HMO | Admitting: *Deleted

## 2018-01-22 DIAGNOSIS — Z0130 Encounter for examination of blood pressure without abnormal findings: Secondary | ICD-10-CM

## 2018-01-22 NOTE — Progress Notes (Signed)
bp check BP 160 78 P 78

## 2018-01-25 ENCOUNTER — Ambulatory Visit (INDEPENDENT_AMBULATORY_CARE_PROVIDER_SITE_OTHER): Payer: Medicare HMO | Admitting: Pediatrics

## 2018-01-25 ENCOUNTER — Encounter: Payer: Self-pay | Admitting: Pediatrics

## 2018-01-25 VITALS — BP 152/79 | HR 82 | Temp 97.3°F | Ht 62.0 in | Wt 156.8 lb

## 2018-01-25 DIAGNOSIS — E785 Hyperlipidemia, unspecified: Secondary | ICD-10-CM

## 2018-01-25 DIAGNOSIS — Z6828 Body mass index (BMI) 28.0-28.9, adult: Secondary | ICD-10-CM | POA: Diagnosis not present

## 2018-01-25 DIAGNOSIS — I10 Essential (primary) hypertension: Secondary | ICD-10-CM | POA: Diagnosis not present

## 2018-01-25 DIAGNOSIS — Z1211 Encounter for screening for malignant neoplasm of colon: Secondary | ICD-10-CM | POA: Diagnosis not present

## 2018-01-25 DIAGNOSIS — Z23 Encounter for immunization: Secondary | ICD-10-CM

## 2018-01-25 MED ORDER — HYDROCHLOROTHIAZIDE 12.5 MG PO TABS: 12.5000 mg | ORAL_TABLET | Freq: Every day | ORAL | 1 refills | Status: DC

## 2018-01-25 MED ORDER — LOSARTAN POTASSIUM 100 MG PO TABS: 100.0000 mg | ORAL_TABLET | Freq: Every day | ORAL | 1 refills | Status: DC

## 2018-01-25 NOTE — Addendum Note (Signed)
Addended by: Wardell Heath on: 01/25/2018 10:04 AM   Modules accepted: Orders

## 2018-01-25 NOTE — Progress Notes (Signed)
  Subjective:   Patient ID: Gabrielle [name], female    DOB: March 20, 1946, 72 y.o.   MRN: [id number] CC: Follow-up (2 week, BP)  HPI: Gabrielle [name] is a 72 y.o. female presenting for Follow-up (2 week, BP) here today with her sister.  Elevated BMI: Has been walking 2-3 times a day around her apartment complex, about 10-15 min at a time. Up and down stairs. Avoiding sugary foods.   HTN: feels better since starting on medications. "like a weight lifted off". No headaches, no CP or pressure, no SOB with exertion.  Cough ongoing since starting lisinopril. Sister thinks she had a cough with a blood pressure medicine in the past.   Declines colonoscopy. Due for mammogram.   HLD: taking medicine regularly, no s/e  Relevant past medical, surgical, family and social history reviewed. Allergies and medications reviewed and updated. Social History   Tobacco Use  Smoking Status Never Smoker  Smokeless Tobacco Never Used   ROS: Per HPI   Objective:    BP (!) 152/79   Pulse 82   Temp (!) 97.3 F (36.3 C) (Oral)   Ht 5\' 2"  (1.575 m)   Wt 156 lb 12.8 oz (71.1 kg)   BMI 28.68 kg/m   Wt Readings from Last 3 Encounters:  01/25/18 156 lb 12.8 oz (71.1 kg)  01/10/18 161 lb (73 kg)  01/03/18 160 lb 3.2 oz (72.7 kg)    Gen: NAD, alert, cooperative with exam, NCAT EYES: EOMI, no conjunctival injection, or no icterus ENT: OP without erythema LYMPH: no cervical LAD CV: NRRR, normal S1/S2, no murmur, distal pulses 2+ b/l Resp: CTABL, no wheezes, normal WOB Abd: +BS, soft, NTND.  Ext: trace edema, warm Neuro: Alert and oriented MSK: normal muscle bulk  Assessment & Plan:  Gabrielle [name] was seen today for follow-up multiple med problems.  Diagnoses and all orders for this visit:  Essential hypertension Stop ACE-I due to cough. Start losartan. Also will add HCTZ for persistently elevated BP. Cont amlodipine. Avoid salt.  -     losartan (COZAAR) 100 MG tablet; Take 1 tablet (100 mg total) by  mouth daily. -     hydrochlorothiazide (HYDRODIURIL) 12.5 MG tablet; Take 1 tablet (12.5 mg total) by mouth daily.  Colon cancer screening -     Fecal occult blood, imunochemical; Future  BMI 28.0-28.9,adult Discussed lifestyle changes, decreasing sugary foods, increasing physical activity.   Hyperlipidemia, unspecified hyperlipidemia type Cont statin.  Prevnar and flu shot today  Follow up plan: Return in about 4 weeks (around 02/22/2018). Needs BMP next visit.  Assunta Found, MD [place]

## 2018-02-07 ENCOUNTER — Ambulatory Visit: Payer: Medicare HMO

## 2018-02-16 NOTE — Progress Notes (Signed)
[facility] Note  02/19/2018     CHIEF COMPLAINT Patient presents for Retina Follow Up   HISTORY OF PRESENT ILLNESS: Gabrielle [name] is a 72 y.o. female who presents to the clinic today for:   HPI    Retina Follow Up    Patient presents with  Wet AMD.  In both eyes.  This started 3 months ago.  Severity is mild.  Since onset it is gradually improving.  I, the attending physician,  performed the HPI with the patient and updated documentation appropriately.          Comments    F/U EXU AMD OU. Patient states her vision is slowly improving, "If I had new glasses ,I think I can see better". Pt reports her Bp medication was increased. Pt is ready for eye Tx today.       Last edited by Bernarda Caffey, MD on 02/19/2018  8:40 AM. (History)      Referring physician: Eustaquio Maize, MD [place], [place] [address]  HISTORICAL INFORMATION:   Selected notes from the MEDICAL RECORD NUMBER Referred by Dr. Cristela Blue for concern of ARMD OU;  LEE- 01.03.19 Cristela Blue) [BCVA OD: 10/50 OS: 20/80] Ocular Hx- ARMD OU, DME PMH- elevated chol., HTN    CURRENT MEDICATIONS: No current outpatient medications on file. (Ophthalmic Drugs)   Current Facility-Administered Medications (Ophthalmic Drugs)  Medication Route  . aflibercept (EYLEA) SOLN 2 mg Intravitreal  . aflibercept (EYLEA) SOLN 2 mg Intravitreal   Current Outpatient Medications (Other)  Medication Sig  . amLODipine (NORVASC) 10 MG tablet Take 1 tablet (10 mg total) by mouth daily.  . hydrochlorothiazide (HYDRODIURIL) 12.5 MG tablet Take 1 tablet (12.5 mg total) by mouth daily.  Marland Kitchen losartan (COZAAR) 100 MG tablet Take 1 tablet (100 mg total) by mouth daily.  . pravastatin (PRAVACHOL) 40 MG tablet Take 1 tablet (40 mg total) by mouth daily.   Current Facility-Administered Medications (Other)  Medication Route  . Bevacizumab (AVASTIN) SOLN 1.25 mg Intravitreal  . Bevacizumab (AVASTIN) SOLN 1.25  mg Intravitreal      REVIEW OF SYSTEMS: ROS    Positive for: Eyes   Negative for: Constitutional, Gastrointestinal, Neurological, Skin, Genitourinary, Musculoskeletal, HENT, Endocrine, Cardiovascular, Respiratory, Psychiatric, Allergic/Imm, Heme/Lymph   Last edited by Zenovia Jordan, LPN on 7/82/9562  1:30 AM. (History)       ALLERGIES Allergies  Allergen Reactions  . Lisinopril     cough    PAST MEDICAL HISTORY Past Medical History:  Diagnosis Date  . H/O cesarean section   . Hx of tonsillectomy   . Hyperlipidemia   . Hypertension    History reviewed. No pertinent surgical history.  FAMILY HISTORY Family History  Problem Relation Age of Onset  . Macular degeneration Mother   . Stroke Mother   . Hypertension Mother   . Cancer Father        lung  . Diabetes Sister   . Hypertension Sister     SOCIAL HISTORY Social History   Tobacco Use  . Smoking status: Never Smoker  . Smokeless tobacco: Never Used  Substance Use Topics  . Alcohol use: No    Frequency: Never  . Drug use: No         OPHTHALMIC EXAM:  Base Eye Exam    Visual Acuity (Snellen - Linear)      Right Left   Dist cc 20/100 -2 20/100 -2   Dist ph  cc NI NI   Correction:  Glasses       Tonometry (Tonopen, 8:33 AM)      Right Left   Pressure 24 21       Tonometry #2 (Tonopen, 8:33 AM)      Right Left   Pressure 27        Pupils      Dark Light Shape React APD   Right 5 3 Round Brisk None   Left 5 3 Round Brisk None       Visual Fields (Counting fingers)      Left Right    Full    Restrictions  Central scotoma       Extraocular Movement      Right Left    Full, Ortho Full, Ortho       Neuro/Psych    Oriented x3:  Yes   Mood/Affect:  Normal       Dilation    Both eyes:  1.0% Mydriacyl, 2.5% Phenylephrine @ 8:33 AM        Slit Lamp and Fundus Exam    Slit Lamp Exam      Right Left   Lids/Lashes Dermatochalasis - upper lid Dermatochalasis - upper lid    Conjunctiva/Sclera White and quiet White and quiet   Cornea Arcus, 1+ Punctate epithelial erosions Arcus, 2+ Punctate epithelial erosions   Anterior Chamber Deep and quiet Deep and quiet   Iris Round and well dilated Round and well dilated   Lens 2-3+ Nuclear sclerosis, 2+ Cortical cataract, trace Posterior subcapsular cataract centrally 2-3+ Nuclear sclerosis, 2+ Cortical cataract, trace Posterior subcapsular cataract centrally   Vitreous Vitreous syneresis, Posterior vitreous detachment Vitreous syneresis, Posterior vitreous detachment       Fundus Exam      Right Left   Disc Normal Normal   C/D Ratio 0.35 0.35   Macula Blunted foveal reflex, Drusen, Pigment clumping, central thickening, scattered Exudates, +PED Blunted foveal reflex, central thickening, hemorrhage along inferior border of thickening (improved), RPE clumping, Drusen, RPE rip with surrounding heme slightly improved   Vessels Vascular attenuation Vascular attenuation   Periphery Attached, mild Reticular degeneration Attached, mild Reticular degeneration          IMAGING AND PROCEDURES  Imaging and Procedures for 02/19/18  OCT, Retina - OU - Both Eyes     Right Eye Quality was good. Central Foveal Thickness: 603. Progression has improved. Findings include subretinal fluid, intraretinal fluid, pigment epithelial detachment, epiretinal membrane, subretinal hyper-reflective material, retinal drusen .   Left Eye Quality was good. Central Foveal Thickness: 362. Progression has improved. Findings include subretinal hyper-reflective material, pigment epithelial detachment, subretinal fluid, intraretinal fluid, retinal drusen .   Notes *Images captured and stored on drive  Diagnosis / Impression:  Exudative AMD OU -- interval improvement OU  Clinical management:  See below  Abbreviations: NFP - Normal foveal profile. CME - cystoid macular edema. PED - pigment epithelial detachment. IRF - intraretinal fluid. SRF -  subretinal fluid. EZ - ellipsoid zone. ERM - epiretinal membrane. ORA - outer retinal atrophy. ORT - outer retinal tubulation. SRHM - subretinal hyper-reflective material        Intravitreal Injection, Pharmacologic Agent - OD - Right Eye     Time Out 02/19/2018. 9:10 AM. Confirmed correct patient, procedure, site, and patient consented.   Anesthesia Topical anesthesia was used. Anesthetic medications included Tetracaine 0.5%, Lidocaine 2%.   Procedure Preparation included 5% betadine to ocular surface, eyelid speculum. A 30 gauge needle  was used.   Injection: 2 mg aflibercept 2 MG/0.05ML   NDC: 24580-998-33    Lot: [id number]    Expiration Date: 09/22/2018   Route: Intravitreal   Site: Right Eye   Waste: 0.05 mg  Post-op Post injection exam found visual acuity of at least counting fingers. The patient tolerated the procedure well. There were no complications. The patient received written and verbal post procedure care education.        Intravitreal Injection, Pharmacologic Agent - OS - Left Eye     Time Out 02/19/2018. 9:11 AM. Confirmed correct patient, procedure, site, and patient consented.   Anesthesia Topical anesthesia was used. Anesthetic medications included Tetracaine 0.5%, Lidocaine 2%.   Procedure Preparation included 5% betadine to ocular surface, eyelid speculum. A 30 gauge needle was used.   Injection: 2 mg aflibercept 2 MG/0.05ML   NDC: 61755-005-02    Lot: [id number]    Expiration Date: 09/04/2018   Route: Intravitreal   Site: Left Eye   Waste: 0.05 mg  Post-op Post injection exam found visual acuity of at least counting fingers. The patient tolerated the procedure well. There were no complications. The patient received written and verbal post procedure care education.                 ASSESSMENT/PLAN:    ICD-10-CM   1. Exudative age-related macular degeneration of both eyes with active choroidal neovascularization (HCC) H35.3231 Intravitreal  Injection, Pharmacologic Agent - OD - Right Eye    Intravitreal Injection, Pharmacologic Agent - OS - Left Eye    aflibercept (EYLEA) SOLN 2 mg    aflibercept (EYLEA) SOLN 2 mg  2. Retinal edema H35.81 OCT, Retina - OU - Both Eyes  3. Posterior vitreous detachment of both eyes H43.813   4. Combined form of age-related cataract, both eyes H25.813     1,2. Exudative age related macular degeneration, both eyes.    - The incidence pathology and anatomy of wet AMD discussed   - The ANCHOR, MARINA, CATT and VIEW trials discussed with patient.    - discussed treatment options including observation vs intravitreal anti-VEGF agents such as Avastin, Lucentis, Eylea.    - Risks of endophthalmitis and vascular occlusive events and atrophic changes discussed with patient  - S/P IVA OD #1 (01.04.19), #2 (02.15.19)  - S/P IVA OS #1 (01.18.19) . #2 (02.15.19)  - OCT today shows interval improvement OU but with persistent, active disease  - recommend switch to [facility] today due to severity of disease -- pt in agreement  - recommend IVE OU #1 today  - pt wishes to be treated with IVE OU, today (03.18.19)  - RBA of procedure discussed, questions answered  - informed consent obtained and signed  - see procedure note  - Eylea paperwork filled out on 02.15.19 and fully approved through [employer]  - f/u in 4 wks for DFE/OCT/possible injection  3. PVD / vitreous syneresis  Discussed findings and prognosis  No RT or RD on 360  exam  Reviewed s/s of RT/RD  Strict return precautions for any such RT/RD signs/symptoms  4. Combined form of age related cataract OU- - The symptoms of cataract, surgical options, and treatments and risks were discussed with patient. - discussed diagnosis and progression - not yet visually significant - monitor for now   Ophthalmic Meds Ordered this visit:  Meds ordered this encounter  Medications  . aflibercept (EYLEA) SOLN 2 mg  . aflibercept (EYLEA) SOLN 2 mg  Return in about 4 weeks (around 03/19/2018) for F/U Exu AMD OU.  There are no Patient Instructions on file for this visit.   Explained the diagnoses, plan, and follow up with the patient and they expressed understanding.  Patient expressed understanding of the importance of proper follow up care.   This document serves as a record of services personally performed by Gardiner Sleeper, MD, PhD. It was created on their behalf by Catha Brow, [place], a certified ophthalmic assistant. The creation of this record is the provider's dictation and/or activities during the visit.  Electronically signed by: Catha Brow, [place]  02/19/18 9:46 AM   Gardiner Sleeper, M.D., Ph.D. Diseases & Surgery of the Retina and [place] 02/19/18  I have reviewed the above documentation for accuracy and completeness, and I agree with the above. Gardiner Sleeper, M.D., Ph.D. 02/19/18 9:46 AM    Abbreviations: M myopia (nearsighted); A astigmatism; H hyperopia (farsighted); P presbyopia; Mrx spectacle prescription;  CTL contact lenses; OD right eye; OS left eye; OU both eyes  XT exotropia; ET esotropia; PEK punctate epithelial keratitis; PEE punctate epithelial erosions; DES dry eye syndrome; MGD meibomian gland dysfunction; ATs artificial tears; PFAT's preservative free artificial tears; [place] nuclear sclerotic cataract; PSC posterior subcapsular cataract; ERM epi-retinal membrane; PVD posterior vitreous detachment; RD retinal detachment; DM diabetes mellitus; DR diabetic retinopathy; NPDR non-proliferative diabetic retinopathy; PDR proliferative diabetic retinopathy; CSME clinically significant macular edema; DME diabetic macular edema; dbh dot blot hemorrhages; CWS cotton wool spot; POAG primary open angle glaucoma; C/D cup-to-disc ratio; HVF humphrey visual field; GVF goldmann visual field; OCT optical coherence tomography; IOP intraocular pressure; BRVO Branch retinal vein occlusion;  CRVO central retinal vein occlusion; CRAO central retinal artery occlusion; BRAO branch retinal artery occlusion; RT retinal tear; SB scleral buckle; PPV pars plana vitrectomy; VH Vitreous hemorrhage; PRP panretinal laser photocoagulation; IVK intravitreal kenalog; VMT vitreomacular traction; MH Macular hole;  NVD neovascularization of the disc; NVE neovascularization elsewhere; AREDS age related eye disease study; ARMD age related macular degeneration; POAG primary open angle glaucoma; EBMD epithelial/anterior basement membrane dystrophy; ACIOL anterior chamber intraocular lens; IOL intraocular lens; PCIOL posterior chamber intraocular lens; Phaco/IOL phacoemulsification with intraocular lens placement; [place] photorefractive keratectomy; LASIK laser assisted in situ keratomileusis; HTN hypertension; DM diabetes mellitus; COPD chronic obstructive pulmonary disease

## 2018-02-19 ENCOUNTER — Ambulatory Visit (INDEPENDENT_AMBULATORY_CARE_PROVIDER_SITE_OTHER): Payer: Medicare HMO | Admitting: Ophthalmology

## 2018-02-19 ENCOUNTER — Encounter (INDEPENDENT_AMBULATORY_CARE_PROVIDER_SITE_OTHER): Payer: Self-pay | Admitting: Ophthalmology

## 2018-02-19 DIAGNOSIS — H43813 Vitreous degeneration, bilateral: Secondary | ICD-10-CM

## 2018-02-19 DIAGNOSIS — H25813 Combined forms of age-related cataract, bilateral: Secondary | ICD-10-CM | POA: Diagnosis not present

## 2018-02-19 DIAGNOSIS — H3581 Retinal edema: Secondary | ICD-10-CM | POA: Diagnosis not present

## 2018-02-19 DIAGNOSIS — H353231 Exudative age-related macular degeneration, bilateral, with active choroidal neovascularization: Secondary | ICD-10-CM

## 2018-02-19 MED ORDER — AFLIBERCEPT 2MG/0.05ML IZ SOLN FOR KALEIDOSCOPE
2.0000 mg | INTRAVITREAL | Status: DC
  Administered 2018-02-19: 2 mg via INTRAVITREAL

## 2018-02-26 ENCOUNTER — Encounter: Payer: Self-pay | Admitting: Pediatrics

## 2018-02-26 ENCOUNTER — Ambulatory Visit (INDEPENDENT_AMBULATORY_CARE_PROVIDER_SITE_OTHER): Payer: Medicare HMO | Admitting: Pediatrics

## 2018-02-26 VITALS — BP 145/76 | HR 74 | Temp 97.0°F | Ht 62.0 in | Wt 156.8 lb

## 2018-02-26 DIAGNOSIS — I10 Essential (primary) hypertension: Secondary | ICD-10-CM

## 2018-02-26 DIAGNOSIS — R05 Cough: Secondary | ICD-10-CM

## 2018-02-26 DIAGNOSIS — R059 Cough, unspecified: Secondary | ICD-10-CM

## 2018-02-26 DIAGNOSIS — Z1231 Encounter for screening mammogram for malignant neoplasm of breast: Secondary | ICD-10-CM | POA: Diagnosis not present

## 2018-02-26 LAB — BMP8+EGFR
BUN/Creatinine Ratio: 19 (ref 12–28)
BUN: 19 mg/dL (ref 8–27)
CO2: 28 mmol/L (ref 20–29)
CREATININE: 1 mg/dL (ref 0.57–1.00)
Calcium: 9.6 mg/dL (ref 8.7–10.3)
Chloride: 96 mmol/L (ref 96–106)
GFR calc Af Amer: 65 mL/min/{1.73_m2} (ref >59)
GFR, EST NON AFRICAN AMERICAN: 57 mL/min/{1.73_m2} — AB (ref >59)
Glucose: 98 mg/dL (ref 65–99)
POTASSIUM: 4.1 mmol/L (ref 3.5–5.2)
SODIUM: 140 mmol/L (ref 134–144)

## 2018-02-26 LAB — HM MAMMOGRAPHY

## 2018-02-26 MED ORDER — HYDROCHLOROTHIAZIDE 25 MG PO TABS: 25.0000 mg | ORAL_TABLET | Freq: Every day | ORAL | 1 refills | Status: DC

## 2018-02-26 NOTE — Progress Notes (Signed)
  Subjective:   Patient ID: Gabrielle [name], female    DOB: 1946/09/06, 72 y.o.   MRN: [id number] CC: Follow-up (4 week, BP) and Cough  HPI: Gabrielle [name] is a 72 y.o. female presenting for Follow-up (4 week, BP) and Cough  At last visit was taken off of ACE inhibitor and started on arm due to cough.  Also with persistently elevated blood pressures so was started on HCTZ.  Patient also continues taking amlodipine.  Still has a dry cough that bothers her off and on.  Denies any congestion, sore throat, runny nose.  Says she does not get allergies usually in spring.  No fevers.  Appetite is been fine.  Continues to feel better since being blood pressure medicines regularly.  Says she no longer has headache she did before her blood pressure was treated.  Walking regularly, she says she goes out 3 times a day walking about 15-20 minutes each time up and down stairs at her apartment building.   Relevant past medical, surgical, family and social history reviewed. Allergies and medications reviewed and updated. Social History   Tobacco Use  Smoking Status Never Smoker  Smokeless Tobacco Never Used   ROS: Per HPI   Objective:    BP (!) 145/76   Pulse 74   Temp (!) 97 F (36.1 C) (Oral)   Ht _0  (1.575 m)   Wt 156 lb 12.8 oz (71.1 kg)   BMI 28.68 kg/m   Wt Readings from Last 3 Encounters:  02/26/18 156 lb 12.8 oz (71.1 kg)  01/25/18 156 lb 12.8 oz (71.1 kg)  01/10/18 161 lb (73 kg)    Gen: NAD, alert, cooperative with exam, NCAT EYES: EOMI, no conjunctival injection, or no icterus ENT:  TMs pearly gray b/l, OP without erythema LYMPH: no cervical LAD CV: NRRR, normal S1/S2, no murmur, distal pulses 2+ b/l Resp: CTABL, no wheezes, normal WOB Abd: +BS, soft, NTND. no guarding or organomegaly Ext: No edema, warm Neuro: Alert and oriented MSK: normal muscle bulk  Assessment & Plan:  Gabrielle [name] was seen today for follow-up and cough.  Diagnoses and all orders for this  visit:  Essential hypertension Remains elevated.  Will increase Hydrocort thiazide.  Continue amlodipine, losartan. -     BMP8+EGFR -     hydrochlorothiazide (HYDRODIURIL) 25 MG tablet; Take 1 tablet (25 mg total) by mouth daily.  Cough Now off of ACE inhibitors.  With some congestion today in clinic.  Discussed keeping lozenges with her, treating allergies.    Follow up plan: Return in about 1 month (around 03/26/2018). Assunta Found, MD [place]

## 2018-03-15 NOTE — Progress Notes (Deleted)
[facility] Note  03/19/2018     CHIEF COMPLAINT Patient presents for No chief complaint on file.   HISTORY OF PRESENT ILLNESS: Gabrielle [name] is a 72 y.o. female who presents to the clinic today for:     Referring physician: Eustaquio Maize, MD [place], [place] [address]  HISTORICAL INFORMATION:   Selected notes from the MEDICAL RECORD NUMBER Referred by Dr. Cristela Blue for concern of ARMD OU;  LEE- 01.03.19 Cristela Blue) [BCVA OD: 10/50 OS: 20/80] Ocular Hx- ARMD OU, DME PMH- elevated chol., HTN    CURRENT MEDICATIONS: No current outpatient medications on file. (Ophthalmic Drugs)   Current Facility-Administered Medications (Ophthalmic Drugs)  Medication Route  . aflibercept (EYLEA) SOLN 2 mg Intravitreal  . aflibercept (EYLEA) SOLN 2 mg Intravitreal   Current Outpatient Medications (Other)  Medication Sig  . amLODipine (NORVASC) 10 MG tablet Take 1 tablet (10 mg total) by mouth daily.  . hydrochlorothiazide (HYDRODIURIL) 25 MG tablet Take 1 tablet (25 mg total) by mouth daily.  Marland Kitchen losartan (COZAAR) 100 MG tablet Take 1 tablet (100 mg total) by mouth daily.  . pravastatin (PRAVACHOL) 40 MG tablet Take 1 tablet (40 mg total) by mouth daily.   Current Facility-Administered Medications (Other)  Medication Route  . Bevacizumab (AVASTIN) SOLN 1.25 mg Intravitreal  . Bevacizumab (AVASTIN) SOLN 1.25 mg Intravitreal      REVIEW OF SYSTEMS:    ALLERGIES Allergies  Allergen Reactions  . Lisinopril     cough    PAST MEDICAL HISTORY Past Medical History:  Diagnosis Date  . H/O cesarean section   . Hx of tonsillectomy   . Hyperlipidemia   . Hypertension    No past surgical history on file.  FAMILY HISTORY Family History  Problem Relation Age of Onset  . Macular degeneration Mother   . Stroke Mother   . Hypertension Mother   . Cancer Father        lung  . Diabetes Sister   . Hypertension Sister     SOCIAL  HISTORY Social History   Tobacco Use  . Smoking status: Never Smoker  . Smokeless tobacco: Never Used  Substance Use Topics  . Alcohol use: No    Frequency: Never  . Drug use: No         OPHTHALMIC EXAM:   Not recorded      IMAGING AND PROCEDURES  Imaging and Procedures for 03/15/18           ASSESSMENT/PLAN:    ICD-10-CM   1. Exudative age-related macular degeneration of both eyes with active choroidal neovascularization ([place]) H35.3231 OCT, Retina - OU - Both Eyes  2. Retinal edema H35.81   3. Posterior vitreous detachment of both eyes H43.813   4. Combined form of age-related cataract, both eyes H25.813     1,2. Exudative age related macular degeneration, both eyes.    - The incidence pathology and anatomy of wet AMD discussed   - The ANCHOR, MARINA, CATT and VIEW trials discussed with patient.    - discussed treatment options including observation vs intravitreal anti-VEGF agents such as Avastin, Lucentis, Eylea.    - Risks of endophthalmitis and vascular occlusive events and atrophic changes discussed with patient  - S/P IVA OD #1 (01.04.19), #2 (02.15.19)  - S/P IVA OS #1 (01.18.19) . #2 (02.15.19)  - S/P IVE OD #1 (03.18.19)  - S/P IVE OS #1 (03.18.19)  - OCT today shows  interval improvement OU but with persistent, active disease  - recommend switch to [facility] today due to severity of disease -- pt in agreement  - recommend IVE OU #2 today  - pt wishes to be treated with IVE OU, today (***)  - RBA of procedure discussed, questions answered  - informed consent obtained and signed  - see procedure note  - Eylea paperwork filled out on 02.15.19 and fully approved through [employer]  - f/u in 4 wks for DFE/OCT/possible injection  3. PVD / vitreous syneresis  Discussed findings and prognosis  No RT or RD on 360  exam  Reviewed s/s of RT/RD  Strict return precautions for any such RT/RD signs/symptoms  4. Combined form of age related cataract OU- - The  symptoms of cataract, surgical options, and treatments and risks were discussed with patient. - discussed diagnosis and progression - not yet visually significant - monitor for now   Ophthalmic Meds Ordered this visit:  No orders of the defined types were placed in this encounter.      No follow-ups on file.  There are no Patient Instructions on file for this visit.   Explained the diagnoses, plan, and follow up with the patient and they expressed understanding.  Patient expressed understanding of the importance of proper follow up care.   This document serves as a record of services personally performed by Gardiner Sleeper, MD, PhD. It was created on their behalf by Catha Brow, [place], a certified ophthalmic assistant. The creation of this record is the provider's dictation and/or activities during the visit.  Electronically signed by: Catha Brow, [place]  03/15/18 3:43 PM   Gardiner Sleeper, M.D., Ph.D. Diseases & Surgery of the Retina and Vitreous [facility] 03/15/18    Abbreviations: M myopia (nearsighted); A astigmatism; H hyperopia (farsighted); P presbyopia; Mrx spectacle prescription;  CTL contact lenses; OD right eye; OS left eye; OU both eyes  XT exotropia; ET esotropia; PEK punctate epithelial keratitis; PEE punctate epithelial erosions; DES dry eye syndrome; MGD meibomian gland dysfunction; ATs artificial tears; PFAT's preservative free artificial tears; [place] nuclear sclerotic cataract; PSC posterior subcapsular cataract; ERM epi-retinal membrane; PVD posterior vitreous detachment; RD retinal detachment; DM diabetes mellitus; DR diabetic retinopathy; NPDR non-proliferative diabetic retinopathy; PDR proliferative diabetic retinopathy; CSME clinically significant macular edema; DME diabetic macular edema; dbh dot blot hemorrhages; CWS cotton wool spot; POAG primary open angle glaucoma; C/D cup-to-disc ratio; HVF humphrey visual field; GVF goldmann  visual field; OCT optical coherence tomography; IOP intraocular pressure; BRVO Branch retinal vein occlusion; CRVO central retinal vein occlusion; CRAO central retinal artery occlusion; BRAO branch retinal artery occlusion; RT retinal tear; SB scleral buckle; PPV pars plana vitrectomy; VH Vitreous hemorrhage; PRP panretinal laser photocoagulation; IVK intravitreal kenalog; VMT vitreomacular traction; MH Macular hole;  NVD neovascularization of the disc; NVE neovascularization elsewhere; AREDS age related eye disease study; ARMD age related macular degeneration; POAG primary open angle glaucoma; EBMD epithelial/anterior basement membrane dystrophy; ACIOL anterior chamber intraocular lens; IOL intraocular lens; PCIOL posterior chamber intraocular lens; Phaco/IOL phacoemulsification with intraocular lens placement; [place] photorefractive keratectomy; LASIK laser assisted in situ keratomileusis; HTN hypertension; DM diabetes mellitus; COPD chronic obstructive pulmonary disease

## 2018-03-19 ENCOUNTER — Encounter (INDEPENDENT_AMBULATORY_CARE_PROVIDER_SITE_OTHER): Payer: Medicare HMO | Admitting: Ophthalmology

## 2018-03-20 NOTE — Progress Notes (Signed)
[facility] Note  03/21/2018     CHIEF COMPLAINT Patient presents for Retina Follow Up   HISTORY OF PRESENT ILLNESS: Gabrielle [name] is a 72 y.o. female who presents to the clinic today for:   HPI    Retina Follow Up    Patient presents with  Wet AMD.  In both eyes.  Severity is moderate.  Since onset it is gradually worsening.  I, the attending physician,  performed the HPI with the patient and updated documentation appropriately.          Comments    72 y/o female pt here for 4 wk f/u s/p eylea injection ou.  Feels vision ou is gradually getting better.  Eyes feel fatigued a bit this morning, and everything seems to have a heightened amount of glare.  Denies pain, flashes, floaters.  No gtts.       Last edited by Bernarda Caffey, MD on 03/21/2018  3:13 PM. (History)      Referring physician: Eustaquio Maize, MD [place], [place] [id number]  HISTORICAL INFORMATION:   Selected notes from the MEDICAL RECORD NUMBER Referred by Dr. Cristela Blue for concern of ARMD OU;  LEE- 01.03.19 Cristela Blue) [BCVA OD: 10/50 OS: 20/80] Ocular Hx- ARMD OU, DME PMH- elevated chol., HTN    CURRENT MEDICATIONS: No current outpatient medications on file. (Ophthalmic Drugs)   Current Facility-Administered Medications (Ophthalmic Drugs)  Medication Route  . aflibercept (EYLEA) SOLN 2 mg Intravitreal  . aflibercept (EYLEA) SOLN 2 mg Intravitreal  . aflibercept (EYLEA) SOLN 2 mg Intravitreal  . aflibercept (EYLEA) SOLN 2 mg Intravitreal   Current Outpatient Medications (Other)  Medication Sig  . amLODipine (NORVASC) 10 MG tablet Take 1 tablet (10 mg total) by mouth daily.  . hydrochlorothiazide (HYDRODIURIL) 25 MG tablet Take 1 tablet (25 mg total) by mouth daily.  Marland Kitchen losartan (COZAAR) 100 MG tablet Take 1 tablet (100 mg total) by mouth daily.  . pravastatin (PRAVACHOL) 40 MG tablet Take 1 tablet (40 mg total) by mouth daily.   Current Facility-Administered  Medications (Other)  Medication Route  . Bevacizumab (AVASTIN) SOLN 1.25 mg Intravitreal  . Bevacizumab (AVASTIN) SOLN 1.25 mg Intravitreal      REVIEW OF SYSTEMS: ROS    Positive for: Cardiovascular, Eyes   Negative for: Constitutional, Gastrointestinal, Neurological, Skin, Genitourinary, Musculoskeletal, HENT, Endocrine, Respiratory, Psychiatric, Allergic/Imm, Heme/Lymph   Last edited by Matthew Folks, COA on 03/21/2018 10:30 AM. (History)       ALLERGIES Allergies  Allergen Reactions  . Lisinopril     cough    PAST MEDICAL HISTORY Past Medical History:  Diagnosis Date  . H/O cesarean section   . Hx of tonsillectomy   . Hyperlipidemia   . Hypertension    History reviewed. No pertinent surgical history.  FAMILY HISTORY Family History  Problem Relation Age of Onset  . Macular degeneration Mother   . Stroke Mother   . Hypertension Mother   . Cancer Father        lung  . Diabetes Sister   . Hypertension Sister     SOCIAL HISTORY Social History   Tobacco Use  . Smoking status: Never Smoker  . Smokeless tobacco: Never Used  Substance Use Topics  . Alcohol use: No    Frequency: Never  . Drug use: No         OPHTHALMIC EXAM:  Base Eye Exam    Visual Acuity (Snellen -  Linear)      Right Left   Dist cc CF @ 2' 20/250   Dist ph cc CF @ 2' 20/250   Correction:  Glasses       Tonometry (Tonopen, 10:28 AM)      Right Left   Pressure 18 15       Pupils      Dark Light Shape React APD   Right 5 3 Round Brisk None   Left 5 3 Round Brisk None       Visual Fields (Counting fingers)      Left Right    Full    Restrictions  Partial inner superior temporal, inferior temporal, superior nasal, inferior nasal deficiencies       Extraocular Movement      Right Left    Full, Ortho Full, Ortho       Neuro/Psych    Oriented x3:  Yes   Mood/Affect:  Normal       Dilation    Both eyes:  1.0% Mydriacyl, 2.5% Phenylephrine @ 10:28 AM         Slit Lamp and Fundus Exam    Slit Lamp Exam      Right Left   Lids/Lashes Dermatochalasis - upper lid Dermatochalasis - upper lid   Conjunctiva/Sclera White and quiet White and quiet   Cornea Arcus, 1+ Punctate epithelial erosions Arcus, 2+ Punctate epithelial erosions   Anterior Chamber Deep and quiet Deep and quiet   Iris Round and well dilated Round and well dilated   Lens 2-3+ Nuclear sclerosis, 2+ Cortical cataract, trace Posterior subcapsular cataract centrally 2-3+ Nuclear sclerosis, 2+ Cortical cataract, trace Posterior subcapsular cataract centrally   Vitreous Vitreous syneresis, Posterior vitreous detachment Vitreous syneresis, Posterior vitreous detachment       Fundus Exam      Right Left   Disc Normal Normal   C/D Ratio 0.35 0.35   Macula Blunted foveal reflex, Drusen, Pigment clumping, central thickening, scattered Exudates, +PED, central discaform scar forming Blunted foveal reflex, central thickening, hemorrhage along inferior border of thickening (resolved), RPE clumping, Drusen, RPE rip with surrounding heme slightly improved   Vessels Vascular attenuation Vascular attenuation   Periphery Attached, mild Reticular degeneration Attached, mild Reticular degeneration          IMAGING AND PROCEDURES  Imaging and Procedures for 03/21/18  OCT, Retina - OU - Both Eyes       Right Eye Quality was good. Central Foveal Thickness: 547. Progression has improved. Findings include subretinal fluid, intraretinal fluid, pigment epithelial detachment, epiretinal membrane, subretinal hyper-reflective material, retinal drusen , abnormal foveal contour (Persistent IRF, sub-retinal scar).   Left Eye Quality was good. Central Foveal Thickness: 396. Progression has improved. Findings include subretinal hyper-reflective material, pigment epithelial detachment, subretinal fluid, intraretinal fluid, retinal drusen , abnormal foveal contour (Persistent SRF/IRF, RPE tear).    Notes *Images captured and stored on drive  Diagnosis / Impression:  Exudative AMD OU -- interval improvement OU  Clinical management:  See below  Abbreviations: NFP - Normal foveal profile. CME - cystoid macular edema. PED - pigment epithelial detachment. IRF - intraretinal fluid. SRF - subretinal fluid. EZ - ellipsoid zone. ERM - epiretinal membrane. ORA - outer retinal atrophy. ORT - outer retinal tubulation. SRHM - subretinal hyper-reflective material        Intravitreal Injection, Pharmacologic Agent - OD - Right Eye       Time Out 03/21/2018. 12:02 PM. Confirmed correct patient, procedure, site, and patient consented.  Anesthesia Anesthetic medications included Tetracaine 0.5%, Lidocaine 2%.   Procedure Preparation included 5% betadine to ocular surface, eyelid speculum. A supplied needle was used.   Injection: 2 mg aflibercept 2 MG/0.05ML   NDC: 29476-546-50    Lot: [id number]    Expiration Date: 10/05/2018   Route: Intravitreal   Site: Right Eye   Waste: 0.05 mg  Post-op Post injection exam found visual acuity of at least counting fingers. The patient tolerated the procedure well. There were no complications. The patient received written and verbal post procedure care education.        Intravitreal Injection, Pharmacologic Agent - OS - Left Eye       Time Out 03/21/2018. 12:02 PM. Confirmed correct patient, procedure, site, and patient consented.   Anesthesia Topical anesthesia was used. Anesthetic medications included Lidocaine 2%, Tetracaine 0.5%.   Procedure Preparation included 5% betadine to ocular surface, eyelid speculum. A 30 gauge needle was used.   Injection: 2 mg aflibercept 2 MG/0.05ML   NDC: 51700-174-94    Lot: [id number]    Expiration Date: 10/05/2018   Route: Intravitreal   Site: Left Eye   Waste: 0.05 mg  Post-op Post injection exam found visual acuity of at least counting fingers. The patient tolerated the procedure well. There  were no complications. The patient received written and verbal post procedure care education.                 ASSESSMENT/PLAN:    ICD-10-CM   1. Exudative age-related macular degeneration of both eyes with active choroidal neovascularization (HCC) H35.3231 OCT, Retina - OU - Both Eyes    Intravitreal Injection, Pharmacologic Agent - OD - Right Eye    Intravitreal Injection, Pharmacologic Agent - OS - Left Eye    aflibercept (EYLEA) SOLN 2 mg    aflibercept (EYLEA) SOLN 2 mg  2. Retinal edema H35.81   3. Posterior vitreous detachment of both eyes H43.813   4. Combined form of age-related cataract, both eyes H25.813     1,2. Exudative age related macular degeneration, both eyes.    - The incidence pathology and anatomy of wet AMD discussed   - The ANCHOR, MARINA, CATT and VIEW trials discussed with patient.    - discussed treatment options including observation vs intravitreal anti-VEGF agents such as Avastin, Lucentis, Eylea.    - Risks of endophthalmitis and vascular occlusive events and atrophic changes discussed with patient  - S/P IVA OD #1 (01.04.19), #2 (02.15.19)  - S/P IVA OS #1 (01.18.19) . #2 (02.15.19)  - S/P IVE OD #1 (03.18.19)  - S/P IVE OS #1 (03.18.19)  - OCT today shows interval improvement OU but with persistent, active disease  - OD with significant subretinal disciform scar, OS with RPE tear  - switched to Eylea 3.18.19 due to severity of disease  - recommend IVE OU #2 today  - pt wishes to be treated with IVE OU, today (04.17.19)  - RBA of procedure discussed, questions answered  - informed consent obtained and signed  - see procedure note  - Eylea paperwork filled out on 02.15.19 and fully approved through [employer]  - f/u in 4 wks for DFE/OCT/possible injection  3. PVD / vitreous syneresis  Discussed findings and prognosis  No RT or RD on 360  exam  Reviewed s/s of RT/RD  Strict return precautions for any such RT/RD signs/symptoms  4. Combined  form of age related cataract OU- - The symptoms of cataract, surgical options, and treatments  and risks were discussed with patient. - discussed diagnosis and progression - not yet visually significant - monitor for now   Ophthalmic Meds Ordered this visit:  Meds ordered this encounter  Medications  . aflibercept (EYLEA) SOLN 2 mg  . aflibercept (EYLEA) SOLN 2 mg       Return in about 1 month (around 04/18/2018) for F/U Exu AMD OU, Dilated exam, OCT.  There are no Patient Instructions on file for this visit.   Explained the diagnoses, plan, and follow up with the patient and they expressed understanding.  Patient expressed understanding of the importance of proper follow up care.   This document serves as a record of services personally performed by Gardiner Sleeper, MD, PhD. It was created on their behalf by Catha Brow, [place], a certified ophthalmic assistant. The creation of this record is the provider's dictation and/or activities during the visit.  Electronically signed by: Catha Brow, [place]  03/21/18 3:28 PM   Gardiner Sleeper, M.D., Ph.D. Diseases & Surgery of the Retina and [place] 03/21/18  I have reviewed the above documentation for accuracy and completeness, and I agree with the above. Gardiner Sleeper, M.D., Ph.D. 03/21/18 3:28 PM    Abbreviations: M myopia (nearsighted); A astigmatism; H hyperopia (farsighted); P presbyopia; Mrx spectacle prescription;  CTL contact lenses; OD right eye; OS left eye; OU both eyes  XT exotropia; ET esotropia; PEK punctate epithelial keratitis; PEE punctate epithelial erosions; DES dry eye syndrome; MGD meibomian gland dysfunction; ATs artificial tears; PFAT's preservative free artificial tears; [place] nuclear sclerotic cataract; PSC posterior subcapsular cataract; ERM epi-retinal membrane; PVD posterior vitreous detachment; RD retinal detachment; DM diabetes mellitus; DR diabetic retinopathy; NPDR  non-proliferative diabetic retinopathy; PDR proliferative diabetic retinopathy; CSME clinically significant macular edema; DME diabetic macular edema; dbh dot blot hemorrhages; CWS cotton wool spot; POAG primary open angle glaucoma; C/D cup-to-disc ratio; HVF humphrey visual field; GVF goldmann visual field; OCT optical coherence tomography; IOP intraocular pressure; BRVO Branch retinal vein occlusion; CRVO central retinal vein occlusion; CRAO central retinal artery occlusion; BRAO branch retinal artery occlusion; RT retinal tear; SB scleral buckle; PPV pars plana vitrectomy; VH Vitreous hemorrhage; PRP panretinal laser photocoagulation; IVK intravitreal kenalog; VMT vitreomacular traction; MH Macular hole;  NVD neovascularization of the disc; NVE neovascularization elsewhere; AREDS age related eye disease study; ARMD age related macular degeneration; POAG primary open angle glaucoma; EBMD epithelial/anterior basement membrane dystrophy; ACIOL anterior chamber intraocular lens; IOL intraocular lens; PCIOL posterior chamber intraocular lens; Phaco/IOL phacoemulsification with intraocular lens placement; [place] photorefractive keratectomy; LASIK laser assisted in situ keratomileusis; HTN hypertension; DM diabetes mellitus; COPD chronic obstructive pulmonary disease

## 2018-03-21 ENCOUNTER — Encounter (INDEPENDENT_AMBULATORY_CARE_PROVIDER_SITE_OTHER): Payer: Self-pay | Admitting: Ophthalmology

## 2018-03-21 ENCOUNTER — Ambulatory Visit (INDEPENDENT_AMBULATORY_CARE_PROVIDER_SITE_OTHER): Payer: Medicare HMO | Admitting: Ophthalmology

## 2018-03-21 DIAGNOSIS — H43813 Vitreous degeneration, bilateral: Secondary | ICD-10-CM

## 2018-03-21 DIAGNOSIS — H353231 Exudative age-related macular degeneration, bilateral, with active choroidal neovascularization: Secondary | ICD-10-CM

## 2018-03-21 DIAGNOSIS — H25813 Combined forms of age-related cataract, bilateral: Secondary | ICD-10-CM

## 2018-03-21 DIAGNOSIS — H3581 Retinal edema: Secondary | ICD-10-CM

## 2018-03-21 MED ORDER — AFLIBERCEPT 2MG/0.05ML IZ SOLN FOR KALEIDOSCOPE
2.0000 mg | INTRAVITREAL | Status: DC
  Administered 2018-03-21: 2 mg via INTRAVITREAL

## 2018-04-02 ENCOUNTER — Encounter: Payer: Self-pay | Admitting: Pediatrics

## 2018-04-02 ENCOUNTER — Ambulatory Visit (INDEPENDENT_AMBULATORY_CARE_PROVIDER_SITE_OTHER): Payer: Medicare HMO | Admitting: Pediatrics

## 2018-04-02 VITALS — BP 139/69 | HR 79 | Temp 97.1°F | Ht 62.0 in | Wt 155.0 lb

## 2018-04-02 DIAGNOSIS — Z1159 Encounter for screening for other viral diseases: Secondary | ICD-10-CM

## 2018-04-02 DIAGNOSIS — Z780 Asymptomatic menopausal state: Secondary | ICD-10-CM | POA: Diagnosis not present

## 2018-04-02 DIAGNOSIS — Z1211 Encounter for screening for malignant neoplasm of colon: Secondary | ICD-10-CM | POA: Diagnosis not present

## 2018-04-02 DIAGNOSIS — I10 Essential (primary) hypertension: Secondary | ICD-10-CM | POA: Diagnosis not present

## 2018-04-02 MED ORDER — LOSARTAN POTASSIUM-HCTZ 100-25 MG PO TABS: 1.0000 | ORAL_TABLET | Freq: Every day | ORAL | 3 refills | Status: DC

## 2018-04-02 NOTE — Progress Notes (Signed)
  Subjective:   Patient ID: Gabrielle [name], female    DOB: [birth year], 72 y.o.   MRN: [id number] CC: Follow-up multiple problems HPI: Gabrielle [name] is a 72 y.o. female  Here today with her sister Hypertension: Taking medicine regularly.  No headaches, no chest pain or shortness of breath   hyperlipidemia: Tolerating statin well  Colon cancer screening: Declines colonoscopy.  Says she would want treatment for colon cancer if she was diagnosed.  Open to doing fit test.  Postmenopausal, due for DEXA scan.  Relevant past medical, surgical, family and social history reviewed. Allergies and medications reviewed and updated. Social History   Tobacco Use  Smoking Status Never Smoker  Smokeless Tobacco Never Used   ROS: Per HPI   Objective:    BP 139/69 (Cuff Size: Normal)   Pulse 79   Temp (!) 97.1 F (36.2 C) (Oral)   Ht 5\' 2"  (1.575 m)   Wt 155 lb (70.3 kg)   BMI 28.35 kg/m   Wt Readings from Last 3 Encounters:  04/02/18 155 lb (70.3 kg)  02/26/18 156 lb 12.8 oz (71.1 kg)  01/25/18 156 lb 12.8 oz (71.1 kg)    Gen: NAD, alert, cooperative with exam, NCAT EYES: EOMI, no conjunctival injection, or no icterus CV: NRRR, normal S1/S2, no murmur, distal pulses 2+ b/l Resp: CTABL, no wheezes, normal WOB Abd: +BS, soft, NTND.  Ext: No edema, warm Neuro: Alert and oriented, strength equal b/l UE and LE, coordination grossly normal MSK: normal muscle bulk  Assessment & Plan:  72 year old here today for follow-up multiple medical problems  Diagnoses and all orders for this visit:  Essential hypertension Stable, continue current medicines. -     losartan-hydrochlorothiazide (HYZAAR) 100-25 MG tablet; Take 1 tablet by mouth daily. -     Basic Metabolic Panel  Post-menopausal DEXA due -     DG [facility] DEXA  Encounter for hepatitis C screening test for low risk patient -     Hepatitis C antibody  Screen for colon cancer -     Fecal occult blood, imunochemical;  Future  Elevated BMI Continue lifestyle changes, walking regularly Follow up plan: Return in about 3 months (around 07/02/2018). Assunta Found, MD [place]

## 2018-04-03 LAB — BASIC METABOLIC PANEL
BUN / CREAT RATIO: 18 (ref 12–28)
BUN: 22 mg/dL (ref 8–27)
CALCIUM: 9.5 mg/dL (ref 8.7–10.3)
CHLORIDE: 96 mmol/L (ref 96–106)
CO2: 28 mmol/L (ref 20–29)
Creatinine, Ser: 1.25 mg/dL — ABNORMAL HIGH (ref 0.57–1.00)
GFR calc non Af Amer: 43 mL/min/{1.73_m2} — ABNORMAL LOW (ref >59)
GFR, EST AFRICAN AMERICAN: 50 mL/min/{1.73_m2} — AB (ref >59)
GLUCOSE: 98 mg/dL (ref 65–99)
POTASSIUM: 4 mmol/L (ref 3.5–5.2)
Sodium: 140 mmol/L (ref 134–144)

## 2018-04-03 LAB — HEPATITIS C ANTIBODY

## 2018-04-13 ENCOUNTER — Other Ambulatory Visit: Payer: Self-pay | Admitting: Pediatrics

## 2018-04-17 NOTE — Progress Notes (Signed)
[facility] Note  04/18/2018     CHIEF COMPLAINT Patient presents for Retina Follow Up   HISTORY OF PRESENT ILLNESS: Gabrielle [name] is a 72 y.o. female who presents to the clinic today for:   HPI    Retina Follow Up    Patient presents with  Wet AMD.  In both eyes.  Severity is moderate.  Duration of 1 month.  Since onset it is stable.  I, the attending physician,  performed the HPI with the patient and updated documentation appropriately.          Comments    Pt presents for Exu ARMD OU F/U, pt feels VA is a little better, but has a haed time reading close up, pt denies flashes, floaters, pain or wavy vision, pt denies the use of gtts/vits, pt states she is tolerating injections well and is ready to receive another one,       Last edited by Bernarda Caffey, MD on 04/18/2018  8:29 AM. (History)      Referring physician: Eustaquio Maize, MD [place], [place] [id number]  HISTORICAL INFORMATION:   Selected notes from the [facility] Referred by Dr. Cristela Blue for concern of ARMD OU;  LEE- 01.03.19 Cristela Blue) [BCVA OD: 10/50 OS: 20/80] Ocular Hx- ARMD OU, DME PMH- elevated chol., HTN    CURRENT MEDICATIONS: No current outpatient medications on file. (Ophthalmic Drugs)   Current Facility-Administered Medications (Ophthalmic Drugs)  Medication Route  . aflibercept (EYLEA) SOLN 2 mg Intravitreal  . aflibercept (EYLEA) SOLN 2 mg Intravitreal  . aflibercept (EYLEA) SOLN 2 mg Intravitreal  . aflibercept (EYLEA) SOLN 2 mg Intravitreal  . aflibercept (EYLEA) SOLN 2 mg Intravitreal  . aflibercept (EYLEA) SOLN 2 mg Intravitreal   Current Outpatient Medications (Other)  Medication Sig  . amLODipine (NORVASC) 10 MG tablet TAKE 1 TABLET DAILY  . losartan-hydrochlorothiazide (HYZAAR) 100-25 MG tablet Take 1 tablet by mouth daily.  . pravastatin (PRAVACHOL) 40 MG tablet Take 1 tablet (40 mg total) by mouth daily.   Current  Facility-Administered Medications (Other)  Medication Route  . Bevacizumab (AVASTIN) SOLN 1.25 mg Intravitreal  . Bevacizumab (AVASTIN) SOLN 1.25 mg Intravitreal      REVIEW OF SYSTEMS: ROS    Positive for: Cardiovascular, Eyes   Negative for: Constitutional, Gastrointestinal, Neurological, Skin, Genitourinary, Musculoskeletal, HENT, Endocrine, Respiratory, Psychiatric, Allergic/Imm, Heme/Lymph   Last edited by Debbrah Alar, COT on 04/18/2018  8:02 AM. (History)       ALLERGIES Allergies  Allergen Reactions  . Lisinopril     cough    PAST MEDICAL HISTORY Past Medical History:  Diagnosis Date  . H/O cesarean section   . Hx of tonsillectomy   . Hyperlipidemia   . Hypertension    Past Surgical History:  Procedure Laterality Date  . CARPAL TUNNEL RELEASE Right   . CESAREAN SECTION    . TONSILLECTOMY      FAMILY HISTORY Family History  Problem Relation Age of Onset  . Macular degeneration Mother   . Stroke Mother   . Hypertension Mother   . Dementia Mother   . Cancer Father        lung  . Diabetes Sister   . Hypertension Sister     SOCIAL HISTORY Social History   Tobacco Use  . Smoking status: Never Smoker  . Smokeless tobacco: Never Used  Substance Use Topics  . Alcohol use: No    Frequency:  Never  . Drug use: No         OPHTHALMIC EXAM:  Base Eye Exam    Visual Acuity (Snellen - Linear)      Right Left   Dist cc CF at 2' 20/150 +2   Dist ph cc NI NI   Correction:  Glasses       Tonometry (Tonopen, 8:09 AM)      Right Left   Pressure 20 17       Pupils      Dark Light Shape React APD   Right 5 3 Round Brisk None   Left 5 3 Round Brisk None       Visual Fields (Counting fingers)      Left Right    Full    Restrictions  Total superior temporal, superior nasal deficiencies       Extraocular Movement      Right Left    Full, Ortho Full, Ortho       Neuro/Psych    Oriented x3:  Yes   Mood/Affect:  Normal       Dilation     Both eyes:  1.0% Mydriacyl, 2.5% Phenylephrine @ 8:09 AM        Slit Lamp and Fundus Exam    Slit Lamp Exam      Right Left   Lids/Lashes Dermatochalasis - upper lid Dermatochalasis - upper lid   Conjunctiva/Sclera White and quiet White and quiet   Cornea Arcus, 1+ Punctate epithelial erosions Arcus, 2+ Punctate epithelial erosions   Anterior Chamber Deep and quiet Deep and quiet   Iris Round and well dilated Round and well dilated   Lens 2-3+ Nuclear sclerosis, 2+ Cortical cataract, trace Posterior subcapsular cataract centrally 2-3+ Nuclear sclerosis, 2+ Cortical cataract, trace Posterior subcapsular cataract centrally   Vitreous Vitreous syneresis, Posterior vitreous detachment Vitreous syneresis, Posterior vitreous detachment       Fundus Exam      Right Left   Disc Normal Normal   C/D Ratio 0.35 0.35   Macula Blunted foveal reflex, Drusen, Pigment clumping, central thickening, scattered Exudates, +PED, central discaform scar continueing to form Blunted foveal reflex, central thickening, RPE clumping, Drusen, RPE rip with surrounding heme - almost resolved   Vessels Vascular attenuation Vascular attenuation   Periphery Attached, mild Reticular degeneration Attached, mild Reticular degeneration          IMAGING AND PROCEDURES  Imaging and Procedures for 03/21/18  OCT, Retina - OU - Both Eyes       Right Eye Quality was good. Central Foveal Thickness: 545. Progression has been stable. Findings include intraretinal fluid, pigment epithelial detachment, epiretinal membrane, subretinal hyper-reflective material, retinal drusen , abnormal foveal contour (Trace persistent IRF, sub-retinal scar--stable from prior).   Left Eye Quality was good. Central Foveal Thickness: 366. Progression has improved. Findings include subretinal hyper-reflective material, pigment epithelial detachment, subretinal fluid, intraretinal fluid, retinal drusen , abnormal foveal contour (Persistent  SRF/IRF, RPE tear--interval improvement in IRF/SRF-all stable).   Notes *Images captured and stored on drive  Diagnosis / Impression:  Exudative ARMD OU OD: subretinal disciform scar forming OS: interval improvement in IRF/SRF, persistent PED and RPE tear  Clinical management:  See below  Abbreviations: NFP - Normal foveal profile. CME - cystoid macular edema. PED - pigment epithelial detachment. IRF - intraretinal fluid. SRF - subretinal fluid. EZ - ellipsoid zone. ERM - epiretinal membrane. ORA - outer retinal atrophy. ORT - outer retinal tubulation. SRHM - subretinal hyper-reflective material  Intravitreal Injection, Pharmacologic Agent - OD - Right Eye       Time Out 04/18/2018. 9:22 AM. Confirmed correct patient, procedure, site, and patient consented.   Anesthesia Topical anesthesia was used. Anesthetic medications included Lidocaine 2%, Proparacaine 0.5%.   Procedure Preparation included 5% betadine to ocular surface, eyelid speculum. A 30 gauge needle was used.   Injection: 2 mg aflibercept 2 MG/0.05ML   NDC: 50388-828-00    Lot: [id number]    Expiration Date: 12/04/2018   Route: Intravitreal   Site: Right Eye   Waste: 0.05 mg  Post-op Post injection exam found visual acuity of at least counting fingers. The patient tolerated the procedure well. There were no complications. The patient received written and verbal post procedure care education.        Intravitreal Injection, Pharmacologic Agent - OS - Left Eye       Time Out 04/18/2018. 8:45 AM. Confirmed correct patient, procedure, site, and patient consented.   Anesthesia Topical anesthesia was used. Anesthetic medications included Proparacaine 0.5%.   Procedure Preparation included eyelid speculum, 5% betadine to ocular surface. A 30 gauge needle was used.   Injection: 2 mg aflibercept 2 MG/0.05ML   NDC: 69794-801-65    Lot: [id number]    Expiration Date: 12/04/2018   Route: Intravitreal    Site: Left Eye   Waste: 0.05 mg  Post-op Post injection exam found visual acuity of at least counting fingers. The patient tolerated the procedure well. There were no complications. The patient received written and verbal post procedure care education.                 ASSESSMENT/PLAN:    ICD-10-CM   1. Exudative age-related macular degeneration of both eyes with active choroidal neovascularization (HCC) H35.3231 Intravitreal Injection, Pharmacologic Agent - OD - Right Eye    Intravitreal Injection, Pharmacologic Agent - OS - Left Eye    aflibercept (EYLEA) SOLN 2 mg    aflibercept (EYLEA) SOLN 2 mg  2. Retinal edema H35.81 OCT, Retina - OU - Both Eyes  3. Posterior vitreous detachment of both eyes H43.813   4. Combined form of age-related cataract, both eyes H25.813     1,2. Exudative age related macular degeneration, both eyes.    - severe exudative disease with very active CNVM OU at presentation in January 2019  - S/P IVA OD #1 (01.04.19), #2 (02.15.19)  - S/P IVA OS #1 (01.18.19), #2 (02.15.19)  - switched to Eylea 3.18.19 due to severity of disease  - S/P IVE OD #1 (03.18.19), #2 (04.17.19)  - S/P IVE OS #1 (03.18.19), #2 (04.17.19)  - OCT today shows mild interval improvement in IRF OU  - OD with significant subretinal disciform scar, OS with RPE tear  - recommend IVE OU #3 today  - discussed findings and possibility that we will hold injections OD after today due to scarring and limited benefit from intravitreal injections  - pt wishes to be treated with IVE OU, today (05.15.19)  - RBA of procedure discussed, questions answered  - informed consent obtained and signed  - see procedure note  - Eylea paperwork filled out on 02.15.19 and fully approved through [employer]  - f/u in 4 wks for DFE/OCT/possible injection  3. PVD / vitreous syneresis  Discussed findings and prognosis  No RT or RD on 360  exam  Reviewed s/s of RT/RD  Strict return precautions for any such  RT/RD signs/symptoms  4. Combined form of age related cataract OU- -  The symptoms of cataract, surgical options, and treatments and risks were discussed with patient. - discussed diagnosis and progression - not yet visually significant - monitor for now   Ophthalmic Meds Ordered this visit:  Meds ordered this encounter  Medications  . aflibercept (EYLEA) SOLN 2 mg  . aflibercept (EYLEA) SOLN 2 mg       Return in about 1 month (around 05/16/2018) for F/u Exu AMD OU, DFE, OCT.  There are no Patient Instructions on file for this visit.   Explained the diagnoses, plan, and follow up with the patient and they expressed understanding.  Patient expressed understanding of the importance of proper follow up care.   This document serves as a record of services personally performed by Gardiner Sleeper, MD, PhD. It was created on their behalf by Catha Brow, [place], a certified ophthalmic assistant. The creation of this record is the provider's dictation and/or activities during the visit.  Electronically signed by: Catha Brow, [place]  05.14.19 9:29 AM    Gardiner Sleeper, M.D., Ph.D. Diseases & Surgery of the Retina and [place] 03/18/18  I have reviewed the above documentation for accuracy and completeness, and I agree with the above. Gardiner Sleeper, M.D., Ph.D. 04/18/18 9:29 AM    Abbreviations: M myopia (nearsighted); A astigmatism; H hyperopia (farsighted); P presbyopia; Mrx spectacle prescription;  CTL contact lenses; OD right eye; OS left eye; OU both eyes  XT exotropia; ET esotropia; PEK punctate epithelial keratitis; PEE punctate epithelial erosions; DES dry eye syndrome; MGD meibomian gland dysfunction; ATs artificial tears; PFAT's preservative free artificial tears; [place] nuclear sclerotic cataract; PSC posterior subcapsular cataract; ERM epi-retinal membrane; PVD posterior vitreous detachment; RD retinal detachment; DM diabetes mellitus; DR  diabetic retinopathy; NPDR non-proliferative diabetic retinopathy; PDR proliferative diabetic retinopathy; CSME clinically significant macular edema; DME diabetic macular edema; dbh dot blot hemorrhages; CWS cotton wool spot; POAG primary open angle glaucoma; C/D cup-to-disc ratio; HVF humphrey visual field; GVF goldmann visual field; OCT optical coherence tomography; IOP intraocular pressure; BRVO Branch retinal vein occlusion; CRVO central retinal vein occlusion; CRAO central retinal artery occlusion; BRAO branch retinal artery occlusion; RT retinal tear; SB scleral buckle; PPV pars plana vitrectomy; VH Vitreous hemorrhage; PRP panretinal laser photocoagulation; IVK intravitreal kenalog; VMT vitreomacular traction; MH Macular hole;  NVD neovascularization of the disc; NVE neovascularization elsewhere; AREDS age related eye disease study; ARMD age related macular degeneration; POAG primary open angle glaucoma; EBMD epithelial/anterior basement membrane dystrophy; ACIOL anterior chamber intraocular lens; IOL intraocular lens; PCIOL posterior chamber intraocular lens; Phaco/IOL phacoemulsification with intraocular lens placement; [place] photorefractive keratectomy; LASIK laser assisted in situ keratomileusis; HTN hypertension; DM diabetes mellitus; COPD chronic obstructive pulmonary disease

## 2018-04-18 ENCOUNTER — Ambulatory Visit (INDEPENDENT_AMBULATORY_CARE_PROVIDER_SITE_OTHER): Payer: Medicare HMO | Admitting: Ophthalmology

## 2018-04-18 ENCOUNTER — Encounter (INDEPENDENT_AMBULATORY_CARE_PROVIDER_SITE_OTHER): Payer: Self-pay | Admitting: Ophthalmology

## 2018-04-18 DIAGNOSIS — H3581 Retinal edema: Secondary | ICD-10-CM | POA: Diagnosis not present

## 2018-04-18 DIAGNOSIS — H25813 Combined forms of age-related cataract, bilateral: Secondary | ICD-10-CM

## 2018-04-18 DIAGNOSIS — H353231 Exudative age-related macular degeneration, bilateral, with active choroidal neovascularization: Secondary | ICD-10-CM

## 2018-04-18 DIAGNOSIS — H43813 Vitreous degeneration, bilateral: Secondary | ICD-10-CM

## 2018-04-18 MED ORDER — AFLIBERCEPT 2MG/0.05ML IZ SOLN FOR KALEIDOSCOPE
2.0000 mg | INTRAVITREAL | Status: DC
  Administered 2018-04-18: 2 mg via INTRAVITREAL

## 2018-05-15 NOTE — Progress Notes (Signed)
[facility] Note  05/16/2018     CHIEF COMPLAINT Patient presents for Retina Follow Up   HISTORY OF PRESENT ILLNESS: Gabrielle [name] is a 72 y.o. female who presents to the clinic today for:   HPI    Retina Follow Up    Patient presents with  Wet AMD.  In both eyes.  This started 3 months ago.  Severity is mild.  Since onset it is stable.  I, the attending physician,  performed the HPI with the patient and updated documentation appropriately.          Comments    F/U EXU ARMD OU. Patient states her vision is about the same, denies new eye issues. Pt ready for tx today if indicated.        Last edited by Bernarda Caffey, MD on 05/16/2018  8:43 AM. (History)      Referring physician: Eustaquio Maize, MD [place], [place] [address]  HISTORICAL INFORMATION:   Selected notes from the MEDICAL RECORD NUMBER Referred by Dr. Cristela Blue for concern of ARMD OU;  LEE- 01.03.19 Cristela Blue) [BCVA OD: 10/50 OS: 20/80] Ocular Hx- ARMD OU, DME PMH- elevated chol., HTN    CURRENT MEDICATIONS: No current outpatient medications on file. (Ophthalmic Drugs)   Current Facility-Administered Medications (Ophthalmic Drugs)  Medication Route  . aflibercept (EYLEA) SOLN 2 mg Intravitreal  . aflibercept (EYLEA) SOLN 2 mg Intravitreal  . aflibercept (EYLEA) SOLN 2 mg Intravitreal  . aflibercept (EYLEA) SOLN 2 mg Intravitreal  . aflibercept (EYLEA) SOLN 2 mg Intravitreal  . aflibercept (EYLEA) SOLN 2 mg Intravitreal  . aflibercept (EYLEA) SOLN 2 mg Intravitreal   Current Outpatient Medications (Other)  Medication Sig  . amLODipine (NORVASC) 10 MG tablet TAKE 1 TABLET DAILY  . losartan-hydrochlorothiazide (HYZAAR) 100-25 MG tablet Take 1 tablet by mouth daily.  . pravastatin (PRAVACHOL) 40 MG tablet Take 1 tablet (40 mg total) by mouth daily.   Current Facility-Administered Medications (Other)  Medication Route  . Bevacizumab (AVASTIN) SOLN 1.25 mg  Intravitreal  . Bevacizumab (AVASTIN) SOLN 1.25 mg Intravitreal      REVIEW OF SYSTEMS: ROS    Positive for: Eyes   Negative for: Constitutional, Gastrointestinal, Neurological, Skin, Genitourinary, Musculoskeletal, HENT, Endocrine, Cardiovascular, Respiratory, Psychiatric, Allergic/Imm, Heme/Lymph   Last edited by Zenovia Jordan, LPN on 05/25/3085  5:78 AM. (History)       ALLERGIES Allergies  Allergen Reactions  . Lisinopril     cough    PAST MEDICAL HISTORY Past Medical History:  Diagnosis Date  . H/O cesarean section   . Hx of tonsillectomy   . Hyperlipidemia   . Hypertension    Past Surgical History:  Procedure Laterality Date  . CARPAL TUNNEL RELEASE Right   . CESAREAN SECTION    . TONSILLECTOMY      FAMILY HISTORY Family History  Problem Relation Age of Onset  . Macular degeneration Mother   . Stroke Mother   . Hypertension Mother   . Dementia Mother   . Cancer Father        lung  . Diabetes Sister   . Hypertension Sister     SOCIAL HISTORY Social History   Tobacco Use  . Smoking status: Never Smoker  . Smokeless tobacco: Never Used  Substance Use Topics  . Alcohol use: No    Frequency: Never  . Drug use: No         OPHTHALMIC EXAM:  Base  Eye Exam    Visual Acuity (Snellen - Linear)      Right Left   Dist cc CF at 1' 20/150 +2   Dist ph cc NI NI   Correction:  Glasses       Tonometry (Tonopen, 8:29 AM)      Right Left   Pressure 19 15       Pupils      Dark Light Shape React APD   Right 5 3 Round Brisk None   Left 5 3 Round Brisk None       Visual Fields (Counting fingers)      Left Right    Full Full       Extraocular Movement      Right Left    Full, Ortho Full, Ortho       Neuro/Psych    Oriented x3:  Yes   Mood/Affect:  Normal       Dilation    Both eyes:  1.0% Mydriacyl, 2.5% Phenylephrine @ 8:29 AM        Slit Lamp and Fundus Exam    Slit Lamp Exam      Right Left   Lids/Lashes Dermatochalasis -  upper lid Dermatochalasis - upper lid   Conjunctiva/Sclera White and quiet White and quiet   Cornea Arcus, 1+ Punctate epithelial erosions Arcus, 2+ Punctate epithelial erosions   Anterior Chamber Deep and quiet Deep and quiet   Iris Round and well dilated Round and well dilated   Lens 2-3+ Nuclear sclerosis, 2+ Cortical cataract, trace Posterior subcapsular cataract centrally 2-3+ Nuclear sclerosis, 2+ Cortical cataract, trace Posterior subcapsular cataract centrally   Vitreous Vitreous syneresis, Posterior vitreous detachment Vitreous syneresis, Posterior vitreous detachment       Fundus Exam      Right Left   Disc Normal Normal   C/D Ratio 0.35 0.35   Macula Blunted foveal reflex, Drusen, Pigment clumping and atrophy, central thickening/disciform scar, scattered Exudates improved, +PED Blunted foveal reflex, central thickening, RPE clumping, Drusen, RPE rip with surrounding heme improved   Vessels Vascular attenuation Vascular attenuation   Periphery Attached, mild Reticular degeneration Attached, mild Reticular degeneration          IMAGING AND PROCEDURES  Imaging and Procedures for 03/21/18  OCT, Retina - OU - Both Eyes       Right Eye Quality was good. Central Foveal Thickness: 493. Progression has been stable. Findings include intraretinal fluid, pigment epithelial detachment, epiretinal membrane, subretinal hyper-reflective material, retinal drusen , abnormal foveal contour (Trace persistent IRF, sub-retinal scar--stable from prior).   Left Eye Quality was good. Central Foveal Thickness: 392. Progression has been stable. Findings include subretinal hyper-reflective material, pigment epithelial detachment, subretinal fluid, intraretinal fluid, retinal drusen , abnormal foveal contour (Persistent SRF/IRF, RPE tear--interval improvement in IRF/SRF-all stable).   Notes *Images captured and stored on drive  Diagnosis / Impression:  Exudative ARMD OU OD: subretinal disciform  scar stable OS: persistent IRF/SRF, persistent PED and RPE tear  Clinical management:  See below  Abbreviations: NFP - Normal foveal profile. CME - cystoid macular edema. PED - pigment epithelial detachment. IRF - intraretinal fluid. SRF - subretinal fluid. EZ - ellipsoid zone. ERM - epiretinal membrane. ORA - outer retinal atrophy. ORT - outer retinal tubulation. SRHM - subretinal hyper-reflective material        Intravitreal Injection, Pharmacologic Agent - OS - Left Eye       Time Out 05/16/2018. 9:11 AM. Confirmed correct patient, procedure, site, and patient  consented.   Anesthesia Topical anesthesia was used. Anesthetic medications included Lidocaine 2%, Tetracaine 0.5%.   Procedure Preparation included 5% betadine to ocular surface, eyelid speculum. A 30 gauge needle was used.   Injection: 2 mg aflibercept 2 MG/0.05ML   NDC: 17793-903-00    Lot: [id number]    Expiration Date: 02/02/2019   Route: Intravitreal   Site: Left Eye   Waste: 0.05 mL  Post-op Post injection exam found visual acuity of at least counting fingers. The patient tolerated the procedure well. There were no complications. The patient received written and verbal post procedure care education.                 ASSESSMENT/PLAN:    ICD-10-CM   1. Exudative age-related macular degeneration of both eyes with active choroidal neovascularization (HCC) H35.3231 Intravitreal Injection, Pharmacologic Agent - OS - Left Eye    aflibercept (EYLEA) SOLN 2 mg    CANCELED: Intravitreal Injection, Pharmacologic Agent - OD - Right Eye  2. Retinal edema H35.81 OCT, Retina - OU - Both Eyes  3. Posterior vitreous detachment of both eyes H43.813   4. Combined form of age-related cataract, both eyes H25.813     1,2. Exudative age related macular degeneration, both eyes.    - severe exudative disease with very active CNVM OU at presentation in January 2019  - S/P IVA OD #1 (01.04.19), #2 (02.15.19)  - S/P IVA OS  #1 (01.18.19), #2 (02.15.19)  - switched to Eylea 3.18.19 due to severity of disease  - S/P IVE OD #1 (03.18.19), #2 (04.17.19), #3 (05.15.19  - S/P IVE OS #1 (03.18.19), #2 (04.17.19), #3 (05.15.19)  - OCT today shows mild interval improvement in IRF OU  - OD with significant subretinal disciform scar; OS with RPE tear  - recommend IVE OS #4 today and holding IVE OD today  - discussed findings and limited benefit from intravitreal injection OD due to scarring  - pt wishes to be treated with IVE OS, today (06.12.19)  - RBA of procedure discussed, questions answered  - informed consent obtained and signed  - see procedure note  - Eylea paperwork filled out on 02.15.19 and fully approved through [employer]  - f/u in 4 wks for DFE/OCT/possible injection  3. PVD / vitreous syneresis  Discussed findings and prognosis  No RT or RD on 360  exam  Reviewed s/s of RT/RD  Strict return precautions for any such RT/RD signs/symptoms  4. Combined form of age related cataract OU- - The symptoms of cataract, surgical options, and treatments and risks were discussed with patient. - discussed diagnosis and progression - not yet visually significant - monitor for now   Ophthalmic Meds Ordered this visit:  Meds ordered this encounter  Medications  . aflibercept (EYLEA) SOLN 2 mg       Return in about 1 month (around 06/13/2018) for Dilated Exam, OCT.  There are no Patient Instructions on file for this visit.   Explained the diagnoses, plan, and follow up with the patient and they expressed understanding.  Patient expressed understanding of the importance of proper follow up care.   This document serves as a record of services personally performed by Gardiner Sleeper, MD, PhD. It was created on their behalf by Catha Brow, [place], a certified ophthalmic assistant. The creation of this record is the provider's dictation and/or activities during the visit.  Electronically signed by: Catha Brow, [place]  06.11.19 10:08 PM    Gardiner Sleeper,  M.D., Ph.D. Diseases & Surgery of the Retina and [place]  I have reviewed the above documentation for accuracy and completeness, and I agree with the above. Gardiner Sleeper, M.D., Ph.D. 05/17/18 10:14 PM    Abbreviations: M myopia (nearsighted); A astigmatism; H hyperopia (farsighted); P presbyopia; Mrx spectacle prescription;  CTL contact lenses; OD right eye; OS left eye; OU both eyes  XT exotropia; ET esotropia; PEK punctate epithelial keratitis; PEE punctate epithelial erosions; DES dry eye syndrome; MGD meibomian gland dysfunction; ATs artificial tears; PFAT's preservative free artificial tears; [place] nuclear sclerotic cataract; PSC posterior subcapsular cataract; ERM epi-retinal membrane; PVD posterior vitreous detachment; RD retinal detachment; DM diabetes mellitus; DR diabetic retinopathy; NPDR non-proliferative diabetic retinopathy; PDR proliferative diabetic retinopathy; CSME clinically significant macular edema; DME diabetic macular edema; dbh dot blot hemorrhages; CWS cotton wool spot; POAG primary open angle glaucoma; C/D cup-to-disc ratio; HVF humphrey visual field; GVF goldmann visual field; OCT optical coherence tomography; IOP intraocular pressure; BRVO Branch retinal vein occlusion; CRVO central retinal vein occlusion; CRAO central retinal artery occlusion; BRAO branch retinal artery occlusion; RT retinal tear; SB scleral buckle; PPV pars plana vitrectomy; VH Vitreous hemorrhage; PRP panretinal laser photocoagulation; IVK intravitreal kenalog; VMT vitreomacular traction; MH Macular hole;  NVD neovascularization of the disc; NVE neovascularization elsewhere; AREDS age related eye disease study; ARMD age related macular degeneration; POAG primary open angle glaucoma; EBMD epithelial/anterior basement membrane dystrophy; ACIOL anterior chamber intraocular lens; IOL intraocular lens; PCIOL posterior  chamber intraocular lens; Phaco/IOL phacoemulsification with intraocular lens placement; [place] photorefractive keratectomy; LASIK laser assisted in situ keratomileusis; HTN hypertension; DM diabetes mellitus; COPD chronic obstructive pulmonary disease

## 2018-05-16 ENCOUNTER — Ambulatory Visit (INDEPENDENT_AMBULATORY_CARE_PROVIDER_SITE_OTHER): Payer: Medicare HMO | Admitting: Ophthalmology

## 2018-05-16 ENCOUNTER — Encounter (INDEPENDENT_AMBULATORY_CARE_PROVIDER_SITE_OTHER): Payer: Self-pay | Admitting: Ophthalmology

## 2018-05-16 DIAGNOSIS — H3581 Retinal edema: Secondary | ICD-10-CM | POA: Diagnosis not present

## 2018-05-16 DIAGNOSIS — H353231 Exudative age-related macular degeneration, bilateral, with active choroidal neovascularization: Secondary | ICD-10-CM

## 2018-05-16 DIAGNOSIS — H25813 Combined forms of age-related cataract, bilateral: Secondary | ICD-10-CM

## 2018-05-16 DIAGNOSIS — H43813 Vitreous degeneration, bilateral: Secondary | ICD-10-CM

## 2018-05-16 MED ORDER — AFLIBERCEPT 2MG/0.05ML IZ SOLN FOR KALEIDOSCOPE
2.0000 mg | INTRAVITREAL | Status: DC
  Administered 2018-05-16: 2 mg via INTRAVITREAL

## 2018-05-17 ENCOUNTER — Encounter (INDEPENDENT_AMBULATORY_CARE_PROVIDER_SITE_OTHER): Payer: Self-pay | Admitting: Ophthalmology

## 2018-06-12 NOTE — Progress Notes (Signed)
[facility] Note  06/13/2018     CHIEF COMPLAINT Patient presents for Retina Follow Up   HISTORY OF PRESENT ILLNESS: Gabrielle [name] is a 72 y.o. female who presents to the clinic today for:   HPI    Retina Follow Up    Patient presents with  Wet AMD.  In both eyes.  This started 2 months ago.  Severity is mild.  Since onset it is stable.  I, the attending physician,  performed the HPI with the patient and updated documentation appropriately.          Comments    F/U EXU AMD OU. Patient states her vision remains the same,denies  new onsets. Patient is ready for tx today indicated.       Last edited by Bernarda Caffey, MD on 06/13/2018  8:27 AM. (History)      Referring physician: Eustaquio Maize, MD [place], [place] [address]  HISTORICAL INFORMATION:   Selected notes from the MEDICAL RECORD NUMBER Referred by Dr. Cristela Blue for concern of ARMD OU;  LEE- 01.03.19 Cristela Blue) [BCVA OD: 10/50 OS: 20/80] Ocular Hx- ARMD OU, DME PMH- elevated chol., HTN    CURRENT MEDICATIONS: No current outpatient medications on file. (Ophthalmic Drugs)   Current Facility-Administered Medications (Ophthalmic Drugs)  Medication Route  . aflibercept (EYLEA) SOLN 2 mg Intravitreal  . aflibercept (EYLEA) SOLN 2 mg Intravitreal  . aflibercept (EYLEA) SOLN 2 mg Intravitreal  . aflibercept (EYLEA) SOLN 2 mg Intravitreal  . aflibercept (EYLEA) SOLN 2 mg Intravitreal  . aflibercept (EYLEA) SOLN 2 mg Intravitreal  . aflibercept (EYLEA) SOLN 2 mg Intravitreal  . aflibercept (EYLEA) SOLN 2 mg Intravitreal   Current Outpatient Medications (Other)  Medication Sig  . amLODipine (NORVASC) 10 MG tablet TAKE 1 TABLET DAILY  . losartan-hydrochlorothiazide (HYZAAR) 100-25 MG tablet Take 1 tablet by mouth daily.  . pravastatin (PRAVACHOL) 40 MG tablet Take 1 tablet (40 mg total) by mouth daily.   Current Facility-Administered Medications (Other)  Medication Route   . Bevacizumab (AVASTIN) SOLN 1.25 mg Intravitreal  . Bevacizumab (AVASTIN) SOLN 1.25 mg Intravitreal      REVIEW OF SYSTEMS: ROS    Positive for: Eyes   Negative for: Constitutional, Gastrointestinal, Neurological, Skin, Genitourinary, Musculoskeletal, HENT, Endocrine, Cardiovascular, Respiratory, Psychiatric, Allergic/Imm, Heme/Lymph   Last edited by Zenovia Jordan, LPN on 2/35/5732  2:02 AM. (History)       ALLERGIES Allergies  Allergen Reactions  . Lisinopril     cough    PAST MEDICAL HISTORY Past Medical History:  Diagnosis Date  . H/O cesarean section   . Hx of tonsillectomy   . Hyperlipidemia   . Hypertension    Past Surgical History:  Procedure Laterality Date  . CARPAL TUNNEL RELEASE Right   . CESAREAN SECTION    . TONSILLECTOMY      FAMILY HISTORY Family History  Problem Relation Age of Onset  . Macular degeneration Mother   . Stroke Mother   . Hypertension Mother   . Dementia Mother   . Cancer Father        lung  . Diabetes Sister   . Hypertension Sister     SOCIAL HISTORY Social History   Tobacco Use  . Smoking status: Never Smoker  . Smokeless tobacco: Never Used  Substance Use Topics  . Alcohol use: No    Frequency: Never  . Drug use: No  OPHTHALMIC EXAM:  Base Eye Exam    Visual Acuity (Snellen - Linear)      Right Left   Dist cc CF at 2' 20/150   Dist ph cc NI NI   Correction:  Glasses       Tonometry (Tonopen, 8:14 AM)      Right Left   Pressure 14 17       Pupils      Dark Light Shape React APD   Right 5 4 Round Brisk None   Left 5 4 Round Brisk None       Visual Fields      Left Right    Full Full       Extraocular Movement      Right Left    Full Full       Neuro/Psych    Oriented x3:  Yes   Mood/Affect:  Normal       Dilation    Both eyes:  phenylephrine, 1.0% Mydriacyl @ 8:15 AM        Slit Lamp and Fundus Exam    Slit Lamp Exam      Right Left   Lids/Lashes Dermatochalasis -  upper lid Dermatochalasis - upper lid   Conjunctiva/Sclera White and quiet White and quiet   Cornea Arcus, 1+ Punctate epithelial erosions Arcus, 2+ Punctate epithelial erosions   Anterior Chamber Deep and quiet Deep and quiet   Iris Round and well dilated Round and well dilated   Lens 2-3+ Nuclear sclerosis, 2+ Cortical cataract, trace Posterior subcapsular cataract centrally 2-3+ Nuclear sclerosis, 2+ Cortical cataract, trace Posterior subcapsular cataract centrally   Vitreous Vitreous syneresis, Posterior vitreous detachment Vitreous syneresis, Posterior vitreous detachment       Fundus Exam      Right Left   Disc Normal Normal   C/D Ratio 0.35 0.35   Macula Blunted foveal reflex, Drusen, Pigment clumping and atrophy, central thickening/disciform scar, scattered Exudates improved, +PED Blunted foveal reflex, central thickening, RPE clumping, Drusen, RPE rip with surrounding heme -- heme cleared, mild residual cystic changes overlying PED   Vessels Vascular attenuation Vascular attenuation   Periphery Attached, mild Reticular degeneration Attached, mild Reticular degeneration          IMAGING AND PROCEDURES  Imaging and Procedures for 03/21/18  OCT, Retina - OU - Both Eyes       Right Eye Quality was good. Central Foveal Thickness: 527. Progression has been stable. Findings include intraretinal fluid, pigment epithelial detachment, epiretinal membrane, subretinal hyper-reflective material, retinal drusen , abnormal foveal contour (Trace persistent IRF, sub-retinal scar--stable from prior).   Left Eye Quality was good. Central Foveal Thickness: 382. Progression has been stable. Findings include subretinal hyper-reflective material, pigment epithelial detachment, subretinal fluid, intraretinal fluid, retinal drusen , abnormal foveal contour (Persistent SRF, mild interval improvement in IRF, RPE tear--interval improvement in IRF/SRF-all stable).   Notes *Images captured and stored  on drive  Diagnosis / Impression:  Exudative ARMD OU OD: subretinal disciform scar stable OS: trace persistent SRF, mild interval improvement in IRF, persistent PED and RPE tear  Clinical management:  See below  Abbreviations: NFP - Normal foveal profile. CME - cystoid macular edema. PED - pigment epithelial detachment. IRF - intraretinal fluid. SRF - subretinal fluid. EZ - ellipsoid zone. ERM - epiretinal membrane. ORA - outer retinal atrophy. ORT - outer retinal tubulation. SRHM - subretinal hyper-reflective material        Intravitreal Injection, Pharmacologic Agent - OS - Left Eye  Time Out 06/13/2018. 8:57 AM. Confirmed correct patient, procedure, site, and patient consented.   Anesthesia Topical anesthesia was used. Anesthetic medications included Lidocaine 2%, Tetracaine 0.5%.   Procedure Preparation included 5% betadine to ocular surface, eyelid speculum. A 30 gauge needle was used.   Injection: 2 mg aflibercept 2 MG/0.05ML   NDC: 93790-240-97    Lot: [id number]    Expiration Date: 03/04/2019   Route: Intravitreal   Site: Left Eye   Waste: 0.05 mg  Post-op Post injection exam found visual acuity of at least counting fingers. The patient tolerated the procedure well. There were no complications. The patient received written and verbal post procedure care education.                 ASSESSMENT/PLAN:    ICD-10-CM   1. Exudative age-related macular degeneration of both eyes with active choroidal neovascularization (HCC) H35.3231 OCT, Retina - OU - Both Eyes    Intravitreal Injection, Pharmacologic Agent - OS - Left Eye    aflibercept (EYLEA) SOLN 2 mg    CANCELED: Intravitreal Injection, Pharmacologic Agent - OD - Right Eye  2. Retinal edema H35.81 OCT, Retina - OU - Both Eyes  3. Posterior vitreous detachment of both eyes H43.813   4. Combined form of age-related cataract, both eyes H25.813     1,2. Exudative age related macular degeneration, both  eyes.    - severe exudative disease with very active CNVM OU at presentation in January 2019  - S/P IVA OD #1 (01.04.19), #2 (02.15.19)  - S/P IVA OS #1 (01.18.19), #2 (02.15.19)  - switched to Eylea 3.18.19 due to severity of disease  - S/P IVE OD #1 (03.18.19), #2 (04.17.19), #3 (05.15.19) -- injections held due to stable disciform scar  - S/P IVE OS #1 (03.18.19), #2 (04.17.19), #3 (05.15.19), #4 (06.12.19)  - OCT today shows mild interval improvement in IRF OS  - OD with significant subretinal disciform scar; OS with RPE tear  - recommend IVE OS #5 today and holding IVE OD again today  - will plan to treat OD ~q3 mos  - pt wishes to be treated with IVE OS, today (07.10.19)  - RBA of procedure discussed, questions answered  - informed consent obtained and signed  - see procedure note  - Eylea paperwork filled out on 02.15.19 and fully approved through [employer]  - f/u in 4 wks for DFE/OCT/possible injection  3. PVD / vitreous syneresis  Discussed findings and prognosis  No RT or RD on 360  exam  Reviewed s/s of RT/RD  Strict return precautions for any such RT/RD signs/symptoms  4. Combined form of age related cataract OU- - The symptoms of cataract, surgical options, and treatments and risks were discussed with patient. - discussed diagnosis and progression - not yet visually significant - monitor for now   Ophthalmic Meds Ordered this visit:  Meds ordered this encounter  Medications  . aflibercept (EYLEA) SOLN 2 mg       Return in about 1 month (around 07/11/2018) for F/U Exu AMD OU, DFE, OCT.  There are no Patient Instructions on file for this visit.   Explained the diagnoses, plan, and follow up with the patient and they expressed understanding.  Patient expressed understanding of the importance of proper follow up care.   This document serves as a record of services personally performed by Gardiner Sleeper, MD, PhD. It was created on their behalf by Catha Brow,  [place], a certified ophthalmic assistant.  The creation of this record is the provider's dictation and/or activities during the visit.  Electronically signed by: Catha Brow, COA  07.09.19 9:20 AM   Gardiner Sleeper, M.D., Ph.D. Diseases & Surgery of the Retina and Vitreous [facility]  I have reviewed the above documentation for accuracy and completeness, and I agree with the above. Gardiner Sleeper, M.D., Ph.D. 06/13/18 9:23 AM   Abbreviations: M myopia (nearsighted); A astigmatism; H hyperopia (farsighted); P presbyopia; Mrx spectacle prescription;  CTL contact lenses; OD right eye; OS left eye; OU both eyes  XT exotropia; ET esotropia; PEK punctate epithelial keratitis; PEE punctate epithelial erosions; DES dry eye syndrome; MGD meibomian gland dysfunction; ATs artificial tears; PFAT's preservative free artificial tears; [place] nuclear sclerotic cataract; PSC posterior subcapsular cataract; ERM epi-retinal membrane; PVD posterior vitreous detachment; RD retinal detachment; DM diabetes mellitus; DR diabetic retinopathy; NPDR non-proliferative diabetic retinopathy; PDR proliferative diabetic retinopathy; CSME clinically significant macular edema; DME diabetic macular edema; dbh dot blot hemorrhages; CWS cotton wool spot; POAG primary open angle glaucoma; C/D cup-to-disc ratio; HVF humphrey visual field; GVF goldmann visual field; OCT optical coherence tomography; IOP intraocular pressure; BRVO Branch retinal vein occlusion; CRVO central retinal vein occlusion; CRAO central retinal artery occlusion; BRAO branch retinal artery occlusion; RT retinal tear; SB scleral buckle; PPV pars plana vitrectomy; VH Vitreous hemorrhage; PRP panretinal laser photocoagulation; IVK intravitreal kenalog; VMT vitreomacular traction; MH Macular hole;  NVD neovascularization of the disc; NVE neovascularization elsewhere; AREDS age related eye disease study; ARMD age related macular degeneration; POAG  primary open angle glaucoma; EBMD epithelial/anterior basement membrane dystrophy; ACIOL anterior chamber intraocular lens; IOL intraocular lens; PCIOL posterior chamber intraocular lens; Phaco/IOL phacoemulsification with intraocular lens placement; [place] photorefractive keratectomy; LASIK laser assisted in situ keratomileusis; HTN hypertension; DM diabetes mellitus; COPD chronic obstructive pulmonary disease

## 2018-06-13 ENCOUNTER — Encounter (INDEPENDENT_AMBULATORY_CARE_PROVIDER_SITE_OTHER): Payer: Self-pay | Admitting: Ophthalmology

## 2018-06-13 ENCOUNTER — Ambulatory Visit (INDEPENDENT_AMBULATORY_CARE_PROVIDER_SITE_OTHER): Payer: Medicare HMO | Admitting: Ophthalmology

## 2018-06-13 DIAGNOSIS — H25813 Combined forms of age-related cataract, bilateral: Secondary | ICD-10-CM

## 2018-06-13 DIAGNOSIS — H43813 Vitreous degeneration, bilateral: Secondary | ICD-10-CM | POA: Diagnosis not present

## 2018-06-13 DIAGNOSIS — H3581 Retinal edema: Secondary | ICD-10-CM | POA: Diagnosis not present

## 2018-06-13 DIAGNOSIS — H353231 Exudative age-related macular degeneration, bilateral, with active choroidal neovascularization: Secondary | ICD-10-CM | POA: Diagnosis not present

## 2018-06-13 MED ORDER — AFLIBERCEPT 2MG/0.05ML IZ SOLN FOR KALEIDOSCOPE
2.0000 mg | INTRAVITREAL | Status: DC
  Administered 2018-06-13: 2 mg via INTRAVITREAL

## 2018-07-10 NOTE — Progress Notes (Signed)
[facility] Note  07/11/2018     CHIEF COMPLAINT Patient presents for Retina Follow Up   HISTORY OF PRESENT ILLNESS: Gabrielle [name] is a 72 y.o. female who presents to the clinic today for:   HPI    Retina Follow Up    Patient presents with  Wet AMD.  In both eyes.  Severity is moderate.  Duration of 1 month.  Since onset it is stable.  I, the attending physician,  performed the HPI with the patient and updated documentation appropriately.          Comments    Pt presents for exu ARMD OU f/u, pt states she feels her vision is a little bettter than last visit, pt denies new FOL, pain, floaters or wavy vision, pt denies the use of gtts, pt has an appt to see her PCP next month, pt states she is doing well with injections and is ready for a new one today,        Last edited by Bernarda Caffey, MD on 07/11/2018  8:25 AM. (History)    Pt states she feels OU VA is getting better;   Referring physician: Eustaquio Maize, MD [place], [place] [id number]  HISTORICAL INFORMATION:   Selected notes from the MEDICAL RECORD NUMBER Referred by Dr. Cristela Blue for concern of ARMD OU;  LEE- 01.03.19 Cristela Blue) [BCVA OD: 10/50 OS: 20/80] Ocular Hx- ARMD OU, DME PMH- elevated chol., HTN    CURRENT MEDICATIONS: No current outpatient medications on file. (Ophthalmic Drugs)   Current Facility-Administered Medications (Ophthalmic Drugs)  Medication Route  . aflibercept (EYLEA) SOLN 2 mg Intravitreal  . aflibercept (EYLEA) SOLN 2 mg Intravitreal  . aflibercept (EYLEA) SOLN 2 mg Intravitreal  . aflibercept (EYLEA) SOLN 2 mg Intravitreal  . aflibercept (EYLEA) SOLN 2 mg Intravitreal  . aflibercept (EYLEA) SOLN 2 mg Intravitreal  . aflibercept (EYLEA) SOLN 2 mg Intravitreal  . aflibercept (EYLEA) SOLN 2 mg Intravitreal  . aflibercept (EYLEA) SOLN 2 mg Intravitreal   Current Outpatient Medications (Other)  Medication Sig  . amLODipine (NORVASC) 10 MG tablet  TAKE 1 TABLET DAILY  . hydrochlorothiazide (HYDRODIURIL) 25 MG tablet   . losartan-hydrochlorothiazide (HYZAAR) 100-25 MG tablet Take 1 tablet by mouth daily.  . pravastatin (PRAVACHOL) 40 MG tablet Take 1 tablet (40 mg total) by mouth daily.   Current Facility-Administered Medications (Other)  Medication Route  . Bevacizumab (AVASTIN) SOLN 1.25 mg Intravitreal  . Bevacizumab (AVASTIN) SOLN 1.25 mg Intravitreal      REVIEW OF SYSTEMS: ROS    Positive for: Cardiovascular, Eyes   Negative for: Constitutional, Gastrointestinal, Neurological, Skin, Genitourinary, Musculoskeletal, HENT, Endocrine, Respiratory, Psychiatric, Allergic/Imm, Heme/Lymph   Last edited by Debbrah Alar, COT on 07/11/2018  8:05 AM. (History)       ALLERGIES Allergies  Allergen Reactions  . Lisinopril     cough    PAST MEDICAL HISTORY Past Medical History:  Diagnosis Date  . H/O cesarean section   . Hx of tonsillectomy   . Hyperlipidemia   . Hypertension    Past Surgical History:  Procedure Laterality Date  . CARPAL TUNNEL RELEASE Right   . CESAREAN SECTION    . TONSILLECTOMY      FAMILY HISTORY Family History  Problem Relation Age of Onset  . Macular degeneration Mother   . Stroke Mother   . Hypertension Mother   . Dementia Mother   . Cancer Father  lung  . Diabetes Sister   . Hypertension Sister     SOCIAL HISTORY Social History   Tobacco Use  . Smoking status: Never Smoker  . Smokeless tobacco: Never Used  Substance Use Topics  . Alcohol use: No    Frequency: Never  . Drug use: No         OPHTHALMIC EXAM:  Base Eye Exam    Visual Acuity (Snellen - Linear)      Right Left   Dist cc 20/200 -1 20/100 -2   Dist ph cc NI NI   Correction:  Glasses       Tonometry (Tonopen, 8:11 AM)      Right Left   Pressure 26 17       Tonometry #2 (Tonopen, 8:11 AM)      Right Left   Pressure 23        Pupils      Dark Light Shape React APD   Right 5 3 Round Brisk  None   Left 5 3 Round Brisk None       Visual Fields (Counting fingers)      Left Right    Full Full       Extraocular Movement      Right Left    Full, Ortho Full, Ortho       Neuro/Psych    Oriented x3:  Yes   Mood/Affect:  Normal       Dilation    Both eyes:  1.0% Mydriacyl, 2.5% Phenylephrine @ 8:11 AM        Slit Lamp and Fundus Exam    Slit Lamp Exam      Right Left   Lids/Lashes Dermatochalasis - upper lid Dermatochalasis - upper lid   Conjunctiva/Sclera White and quiet White and quiet   Cornea Arcus, 1+ Punctate epithelial erosions Arcus, 2+ Punctate epithelial erosions   Anterior Chamber Deep and quiet Deep and quiet   Iris Round and well dilated Round and well dilated   Lens 2-3+ Nuclear sclerosis, 2+ Cortical cataract, trace Posterior subcapsular cataract centrally 2-3+ Nuclear sclerosis, 2+ Cortical cataract, trace Posterior subcapsular cataract centrally   Vitreous Vitreous syneresis, Posterior vitreous detachment Vitreous syneresis, Posterior vitreous detachment       Fundus Exam      Right Left   Disc Normal Normal   C/D Ratio 0.35 0.35   Macula Blunted foveal reflex, Drusen, Pigment clumping and atrophy, central thickening/disciform scar, scattered Exudates improved, +PED Blunted foveal reflex, central thickening, RPE clumping, Drusen, RPE rip, mild residual cystic changes overlying PED   Vessels Vascular attenuation Vascular attenuation   Periphery Attached, mild Reticular degeneration Attached, mild Reticular degeneration          IMAGING AND PROCEDURES  Imaging and Procedures for 03/21/18  OCT, Retina - OU - Both Eyes       Right Eye Quality was good. Central Foveal Thickness: 573. Progression has been stable. Findings include intraretinal fluid, pigment epithelial detachment, epiretinal membrane, subretinal hyper-reflective material, retinal drusen , abnormal foveal contour (Interval imporovement in trace IRF, sub-retinal scar--stable from  prior).   Left Eye Quality was good. Central Foveal Thickness: 388. Progression has been stable. Findings include subretinal hyper-reflective material, pigment epithelial detachment, subretinal fluid, intraretinal fluid, retinal drusen , abnormal foveal contour (Persistent IRF/SRF maybe slightly worse, RPE tear).   Notes *Images captured and stored on drive  Diagnosis / Impression:  Exudative ARMD OU OD: subretinal disciform scar stable - mild dec in tr IRF  overlying OS: persistent IRF/SRF, persistent PED and RPE tear  Clinical management:  See below  Abbreviations: NFP - Normal foveal profile. CME - cystoid macular edema. PED - pigment epithelial detachment. IRF - intraretinal fluid. SRF - subretinal fluid. EZ - ellipsoid zone. ERM - epiretinal membrane. ORA - outer retinal atrophy. ORT - outer retinal tubulation. SRHM - subretinal hyper-reflective material        Intravitreal Injection, Pharmacologic Agent - OS - Left Eye       Time Out 07/11/2018. 8:43 AM. Confirmed correct patient, procedure, site, and patient consented.   Anesthesia Topical anesthesia was used. Anesthetic medications included Lidocaine 2%, Tetracaine 0.5%.   Procedure Preparation included eyelid speculum, 5% betadine to ocular surface. A 30 gauge needle was used.   Injection: 2 mg aflibercept 2 MG/0.05ML   NDC: 61755-005-02    Lot: [id number]    Expiration Date: 04/24/2019   Route: Intravitreal   Site: Left Eye   Waste: 0.05 mL  Post-op Post injection exam found visual acuity of at least counting fingers. The patient tolerated the procedure well. There were no complications. The patient received written and verbal post procedure care education.                 ASSESSMENT/PLAN:    ICD-10-CM   1. Exudative age-related macular degeneration of both eyes with active choroidal neovascularization (HCC) H35.3231 OCT, Retina - OU - Both Eyes    Intravitreal Injection, Pharmacologic Agent - OS - Left  Eye    aflibercept (EYLEA) SOLN 2 mg  2. Retinal edema H35.81 OCT, Retina - OU - Both Eyes  3. Posterior vitreous detachment of both eyes H43.813   4. Combined form of age-related cataract, both eyes H25.813     1,2. Exudative age related macular degeneration, both eyes.    - severe exudative disease with very active CNVM OU at presentation in January 2019  - S/P IVA OD #1 (01.04.19), #2 (02.15.19)  - S/P IVA OS #1 (01.18.19), #2 (02.15.19)  - switched to Eylea 3.18.19 due to severity of disease  - S/P IVE OD #1 (03.18.19), #2 (04.17.19), #3 (05.15.19) -- injections held due to stable disciform scar  - S/P IVE OS #1 (03.18.19), #2 (04.17.19), #3 (05.15.19), #4 (06.12.19), #5 (07.10.19)  - OCT today persistent IRF/SRF OS  - OD with significant subretinal disciform scar; OS with RPE tear  - recommend IVE OS #6 today and holding IVE OD again today  - will plan to treat OD ~q3-4 mos  - pt wishes to be treated with IVE OS, today (08.07.19)  - RBA of procedure discussed, questions answered  - informed consent obtained and signed  - see procedure note  - Eylea paperwork filled out on 02.15.19 and fully approved through [employer]  - f/u in 4 wks for DFE/OCT/possible injection OU  3. PVD / vitreous syneresis  Discussed findings and prognosis  No RT or RD on 360  exam  Reviewed s/s of RT/RD  Strict return precautions for any such RT/RD signs/symptoms  4. Combined form of age related cataract OU- - The symptoms of cataract, surgical options, and treatments and risks were discussed with patient. - discussed diagnosis and progression - not yet visually significant - monitor for now   Ophthalmic Meds Ordered this visit:  Meds ordered this encounter  Medications  . aflibercept (EYLEA) SOLN 2 mg       Return in about 1 month (around 08/08/2018) for F/U Exu AMD OU, DFE, OCT, Possible  Injxn.  There are no Patient Instructions on file for this visit.   Explained the diagnoses, plan, and  follow up with the patient and they expressed understanding.  Patient expressed understanding of the importance of proper follow up care.   This document serves as a record of services personally performed by Gardiner Sleeper, MD, PhD. It was created on their behalf by Catha Brow, [place], a certified ophthalmic assistant. The creation of this record is the provider's dictation and/or activities during the visit.  Electronically signed by: Catha Brow, COA  08.06.19 8:44 AM   Gardiner Sleeper, M.D., Ph.D. Diseases & Surgery of the Retina and Vitreous [facility]   I have reviewed the above documentation for accuracy and completeness, and I agree with the above. Gardiner Sleeper, M.D., Ph.D. 07/11/18 8:53 AM   Abbreviations: M myopia (nearsighted); A astigmatism; H hyperopia (farsighted); P presbyopia; Mrx spectacle prescription;  CTL contact lenses; OD right eye; OS left eye; OU both eyes  XT exotropia; ET esotropia; PEK punctate epithelial keratitis; PEE punctate epithelial erosions; DES dry eye syndrome; MGD meibomian gland dysfunction; ATs artificial tears; PFAT's preservative free artificial tears; [place] nuclear sclerotic cataract; PSC posterior subcapsular cataract; ERM epi-retinal membrane; PVD posterior vitreous detachment; RD retinal detachment; DM diabetes mellitus; DR diabetic retinopathy; NPDR non-proliferative diabetic retinopathy; PDR proliferative diabetic retinopathy; CSME clinically significant macular edema; DME diabetic macular edema; dbh dot blot hemorrhages; CWS cotton wool spot; POAG primary open angle glaucoma; C/D cup-to-disc ratio; HVF humphrey visual field; GVF goldmann visual field; OCT optical coherence tomography; IOP intraocular pressure; BRVO Branch retinal vein occlusion; CRVO central retinal vein occlusion; CRAO central retinal artery occlusion; BRAO branch retinal artery occlusion; RT retinal tear; SB scleral buckle; PPV pars plana vitrectomy;  VH Vitreous hemorrhage; PRP panretinal laser photocoagulation; IVK intravitreal kenalog; VMT vitreomacular traction; MH Macular hole;  NVD neovascularization of the disc; NVE neovascularization elsewhere; AREDS age related eye disease study; ARMD age related macular degeneration; POAG primary open angle glaucoma; EBMD epithelial/anterior basement membrane dystrophy; ACIOL anterior chamber intraocular lens; IOL intraocular lens; PCIOL posterior chamber intraocular lens; Phaco/IOL phacoemulsification with intraocular lens placement; [place] photorefractive keratectomy; LASIK laser assisted in situ keratomileusis; HTN hypertension; DM diabetes mellitus; COPD chronic obstructive pulmonary disease

## 2018-07-11 ENCOUNTER — Ambulatory Visit (INDEPENDENT_AMBULATORY_CARE_PROVIDER_SITE_OTHER): Payer: Medicare HMO | Admitting: Ophthalmology

## 2018-07-11 ENCOUNTER — Encounter (INDEPENDENT_AMBULATORY_CARE_PROVIDER_SITE_OTHER): Payer: Self-pay | Admitting: Ophthalmology

## 2018-07-11 DIAGNOSIS — H353231 Exudative age-related macular degeneration, bilateral, with active choroidal neovascularization: Secondary | ICD-10-CM | POA: Diagnosis not present

## 2018-07-11 DIAGNOSIS — H43813 Vitreous degeneration, bilateral: Secondary | ICD-10-CM | POA: Diagnosis not present

## 2018-07-11 DIAGNOSIS — H3581 Retinal edema: Secondary | ICD-10-CM

## 2018-07-11 DIAGNOSIS — H25813 Combined forms of age-related cataract, bilateral: Secondary | ICD-10-CM | POA: Diagnosis not present

## 2018-07-11 MED ORDER — AFLIBERCEPT 2MG/0.05ML IZ SOLN FOR KALEIDOSCOPE
2.0000 mg | INTRAVITREAL | Status: DC
  Administered 2018-07-11: 2 mg via INTRAVITREAL

## 2018-08-02 ENCOUNTER — Ambulatory Visit (INDEPENDENT_AMBULATORY_CARE_PROVIDER_SITE_OTHER): Payer: Medicare HMO | Admitting: Pediatrics

## 2018-08-02 ENCOUNTER — Encounter: Payer: Self-pay | Admitting: Pediatrics

## 2018-08-02 ENCOUNTER — Ambulatory Visit (INDEPENDENT_AMBULATORY_CARE_PROVIDER_SITE_OTHER): Payer: Medicare HMO

## 2018-08-02 VITALS — BP 126/64 | HR 68 | Temp 97.4°F | Ht 62.0 in | Wt 153.2 lb

## 2018-08-02 DIAGNOSIS — Z780 Asymptomatic menopausal state: Secondary | ICD-10-CM

## 2018-08-02 DIAGNOSIS — I10 Essential (primary) hypertension: Secondary | ICD-10-CM | POA: Diagnosis not present

## 2018-08-02 DIAGNOSIS — M8589 Other specified disorders of bone density and structure, multiple sites: Secondary | ICD-10-CM | POA: Diagnosis not present

## 2018-08-02 DIAGNOSIS — E785 Hyperlipidemia, unspecified: Secondary | ICD-10-CM

## 2018-08-02 DIAGNOSIS — Z1211 Encounter for screening for malignant neoplasm of colon: Secondary | ICD-10-CM | POA: Diagnosis not present

## 2018-08-02 DIAGNOSIS — Z23 Encounter for immunization: Secondary | ICD-10-CM

## 2018-08-02 NOTE — Progress Notes (Signed)
  Subjective:   Patient ID: Gabrielle [name], female    DOB: 1946-05-15, 72 y.o.   MRN: [id number] CC: Medical Management of Chronic Issues (4 month follow up)  HPI: Gabrielle [name] is a 72 y.o. female   Here today with her sister.  Hypertension: Taking her medicines regularly.  Symptoms are she stands up too fast she will get lightheaded for a couple seconds.  She is been taking hydrochlorothiazide 25 mg tablets, losartan HCTZ 100/25 mg tablets and amlodipine 10 mg.  HCTZ was supposed to have been switched to a combination pill with losartan at 4 months ago.  She does not regularly check her blood pressures at home.  Sister visits her 2-3 times a week, could bring her blood pressure machine to check.  Hyperlipidemia: Taking statin regularly.  No side effects.  Elevated BMI: Continues to walk 2-3 times a day.  Colon cancer screening: Open to FIT test.  Aware if abnormal would recommend a colonoscopy.  Relevant past medical, surgical, family and social history reviewed. Allergies and medications reviewed and updated. Social History   Tobacco Use  Smoking Status Never Smoker  Smokeless Tobacco Never Used   ROS: Per HPI   Objective:    BP 126/64   Pulse 68   Temp (!) 97.4 F (36.3 C) (Oral)   Ht _0  (1.575 m)   Wt 153 lb 3.2 oz (69.5 kg)   BMI 28.02 kg/m   Wt Readings from Last 3 Encounters:  08/02/18 153 lb 3.2 oz (69.5 kg)  04/02/18 155 lb (70.3 kg)  02/26/18 156 lb 12.8 oz (71.1 kg)    Gen: NAD, alert, cooperative with exam, NCAT EYES: EOMI, no conjunctival injection, or no icterus CV: NRRR, normal S1/S2, no murmur, distal pulses 2+ b/l Resp: CTABL, no wheezes, normal WOB Abd: +BS, soft, NTND.  Ext: No edema, warm Neuro: Alert and oriented, strength equal b/l UE and LE, coordination grossly normal MSK: normal muscle bulk  Assessment & Plan:  Jermany was seen today for medical management of chronic issues.  Diagnoses and all orders for this visit:  Essential  hypertension Well-controlled, some lightheadedness.  Taking 50 mg of HCTZ daily, prescribed 25 mg.  Decreased to 25 mg hctz.  We will recheck labs. -     BMP8+EGFR  Post-menopausal Take vitamin D and calcium daily. -     DG WRFM DEXA  Screen for colon cancer -     Fecal occult blood, imunochemical; Future  Need for tetanus booster -     Tdap vaccine greater than or equal to 7yo IM  Hyperlipidemia, unspecified hyperlipidemia type Stable, continue statin. -     Lipid panel   Follow up plan: Return in about 6 months (around 02/02/2019). Assunta Found, MD [place]

## 2018-08-03 ENCOUNTER — Encounter: Payer: Self-pay | Admitting: Pharmacist Clinician (PhC)/ Clinical Pharmacy Specialist

## 2018-08-03 ENCOUNTER — Ambulatory Visit (INDEPENDENT_AMBULATORY_CARE_PROVIDER_SITE_OTHER): Payer: Medicare HMO | Admitting: Pharmacist Clinician (PhC)/ Clinical Pharmacy Specialist

## 2018-08-03 ENCOUNTER — Telehealth: Payer: Self-pay | Admitting: Pediatrics

## 2018-08-03 DIAGNOSIS — M810 Age-related osteoporosis without current pathological fracture: Secondary | ICD-10-CM | POA: Diagnosis not present

## 2018-08-03 DIAGNOSIS — Z780 Asymptomatic menopausal state: Secondary | ICD-10-CM

## 2018-08-03 LAB — BMP8+EGFR
BUN/Creatinine Ratio: 17 (ref 12–28)
BUN: 27 mg/dL (ref 8–27)
CALCIUM: 9.9 mg/dL (ref 8.7–10.3)
CHLORIDE: 97 mmol/L (ref 96–106)
CO2: 28 mmol/L (ref 20–29)
Creatinine, Ser: 1.58 mg/dL — ABNORMAL HIGH (ref 0.57–1.00)
GFR calc Af Amer: 38 mL/min/{1.73_m2} — ABNORMAL LOW (ref >59)
GFR calc non Af Amer: 33 mL/min/{1.73_m2} — ABNORMAL LOW (ref >59)
Glucose: 96 mg/dL (ref 65–99)
Potassium: 4.8 mmol/L (ref 3.5–5.2)
Sodium: 140 mmol/L (ref 134–144)

## 2018-08-03 LAB — LIPID PANEL
Chol/HDL Ratio: 3.8 ratio (ref 0.0–4.4)
Cholesterol, Total: 170 mg/dL (ref 100–199)
HDL: 45 mg/dL (ref >39)
LDL Calculated: 95 mg/dL (ref 0–99)
TRIGLYCERIDES: 150 mg/dL — AB (ref 0–149)
VLDL CHOLESTEROL CAL: 30 mg/dL (ref 5–40)

## 2018-08-03 NOTE — Telephone Encounter (Signed)
[facility] at [facility] called to get up dated medications.   Verbal given

## 2018-08-03 NOTE — Progress Notes (Signed)
Patient is referred by Dr. Evette Doffing for osteopenia.  She has a family history of osteoporosis in her mother, aunt and one sister.  She went through menopause at age 72 and did not take HRT.  She has no history of nicotine abuse or alcohol abuse.  No history of steroid use or hypothyroidism.  She was thin when younger and is fair skinned.  Calcium intake his high with daily milk and yogurt.  She does not take vitamin D and no lab level is on file.  No history of GERD.  A/P:  1.  Osteopenia:  T-score of hip is -1.0  T score of forearm is -2.4.  See report for details.  2.  GFR has decreased over the past 5 months, this could be due to increases in HCTZ and her losartan.  Her last GFR was 16ml/min.  This places her on the cusp for taking bisphosphonates, the cut off is 61ml/min.  3.  Will discuss with Dr. Evette Doffing my concerns and also recommend patient has a re-check BMP before starting actonel monthly.  Patient has already been counseled in detail on how to take actonel and risksbenefits of it.  She will start vitamin D 2000IU daily.   4.  Weight baring exercising covered and fall prevention.  5.  Patient should alert her dentist if she starts on actonel to monitor her for jaw osteonecrosis.  Total time spent with patient 35 minutes  Memory Argue, PharmD, CPP. CLS

## 2018-08-07 NOTE — Progress Notes (Signed)
[facility] Note  08/08/2018     CHIEF COMPLAINT Patient presents for Retina Follow Up   HISTORY OF PRESENT ILLNESS: Gabrielle [name] is a 72 y.o. female who presents to the clinic today for:   HPI    Retina Follow Up    Patient presents with  Wet AMD.  In both eyes.  This started 2 months ago.  Severity is mild.  Since onset it is stable.  I, the attending physician,  performed the HPI with the patient and updated documentation appropriately.          Comments    F/U EXU AMD OU. Patient states her vision is about the same since last ov, denies new visual issues. Pt is ready for tx today if indicated.       Last edited by Bernarda Caffey, MD on 08/08/2018  8:41 AM. (History)      Referring physician: Eustaquio Maize, MD [place], [place] [address]  HISTORICAL INFORMATION:   Selected notes from the MEDICAL RECORD NUMBER Referred by Dr. Cristela Blue for concern of ARMD OU;  LEE- 01.03.19 Cristela Blue) [BCVA OD: 10/50 OS: 20/80] Ocular Hx- ARMD OU, DME PMH- elevated chol., HTN    CURRENT MEDICATIONS: No current outpatient medications on file. (Ophthalmic Drugs)   Current Facility-Administered Medications (Ophthalmic Drugs)  Medication Route  . aflibercept (EYLEA) SOLN 2 mg Intravitreal  . aflibercept (EYLEA) SOLN 2 mg Intravitreal  . aflibercept (EYLEA) SOLN 2 mg Intravitreal  . aflibercept (EYLEA) SOLN 2 mg Intravitreal  . aflibercept (EYLEA) SOLN 2 mg Intravitreal  . aflibercept (EYLEA) SOLN 2 mg Intravitreal  . aflibercept (EYLEA) SOLN 2 mg Intravitreal  . aflibercept (EYLEA) SOLN 2 mg Intravitreal  . aflibercept (EYLEA) SOLN 2 mg Intravitreal  . aflibercept (EYLEA) SOLN 2 mg Intravitreal   Current Outpatient Medications (Other)  Medication Sig  . amLODipine (NORVASC) 10 MG tablet TAKE 1 TABLET DAILY  . losartan-hydrochlorothiazide (HYZAAR) 100-25 MG tablet Take 1 tablet by mouth daily.  . pravastatin (PRAVACHOL) 40 MG tablet Take 1  tablet (40 mg total) by mouth daily.   Current Facility-Administered Medications (Other)  Medication Route  . Bevacizumab (AVASTIN) SOLN 1.25 mg Intravitreal  . Bevacizumab (AVASTIN) SOLN 1.25 mg Intravitreal      REVIEW OF SYSTEMS: ROS    Positive for: Eyes   Negative for: Constitutional, Gastrointestinal, Neurological, Skin, Genitourinary, Musculoskeletal, HENT, Endocrine, Cardiovascular, Respiratory, Psychiatric, Allergic/Imm, Heme/Lymph   Last edited by Zenovia Jordan, LPN on 04/10/4331  9:51 AM. (History)       ALLERGIES Allergies  Allergen Reactions  . Lisinopril     cough    PAST MEDICAL HISTORY Past Medical History:  Diagnosis Date  . H/O cesarean section   . Hx of tonsillectomy   . Hyperlipidemia   . Hypertension    Past Surgical History:  Procedure Laterality Date  . CARPAL TUNNEL RELEASE Right   . CESAREAN SECTION    . TONSILLECTOMY      FAMILY HISTORY Family History  Problem Relation Age of Onset  . Macular degeneration Mother   . Stroke Mother   . Hypertension Mother   . Dementia Mother   . Cancer Father        lung  . Diabetes Sister   . Hypertension Sister     SOCIAL HISTORY Social History   Tobacco Use  . Smoking status: Never Smoker  . Smokeless tobacco: Never Used  Substance Use Topics  .  Alcohol use: No    Frequency: Never  . Drug use: No         OPHTHALMIC EXAM:  Base Eye Exam    Visual Acuity (Snellen - Linear)      Right Left   Dist cc 20/100 -1 20/100 -2   Dist ph cc NI 20/100 -1   Correction:  Glasses       Tonometry (Tonopen, 8:14 AM)      Right Left   Pressure 14 16       Pupils      Dark Light Shape React APD   Right 4 3 Round Brisk None   Left 4 3 Round Brisk None       Visual Fields (Counting fingers)      Left Right    Full Full       Extraocular Movement      Right Left    Full, Ortho Full, Ortho       Neuro/Psych    Oriented x3:  Yes   Mood/Affect:  Normal       Dilation    Both  eyes:  1.0% Mydriacyl, 2.5% Phenylephrine @ 8:15 AM        Slit Lamp and Fundus Exam    Slit Lamp Exam      Right Left   Lids/Lashes Dermatochalasis - upper lid Dermatochalasis - upper lid   Conjunctiva/Sclera White and quiet White and quiet   Cornea Arcus, 1+ Punctate epithelial erosions Arcus, 2+ Punctate epithelial erosions   Anterior Chamber Deep and quiet Deep and quiet   Iris Round and well dilated Round and well dilated   Lens 2-3+ Nuclear sclerosis, 2+ Cortical cataract, trace Posterior subcapsular cataract centrally 2-3+ Nuclear sclerosis, 2+ Cortical cataract, trace Posterior subcapsular cataract centrally   Vitreous Vitreous syneresis, Posterior vitreous detachment Vitreous syneresis, Posterior vitreous detachment       Fundus Exam      Right Left   Disc Normal Normal   C/D Ratio 0.35 0.35   Macula Blunted foveal reflex, Drusen, Pigment clumping and atrophy, central thickening/disciform scar, scattered Exudates improved, +PED Blunted foveal reflex, central thickening, RPE clumping, Drusen, RPE rip, mild residual cystic changes overlying PED   Vessels Vascular attenuation Vascular attenuation   Periphery Attached, mild Reticular degeneration Attached, mild Reticular degeneration          IMAGING AND PROCEDURES  Imaging and Procedures for 03/21/18  OCT, Retina - OU - Both Eyes       Right Eye Quality was good. Central Foveal Thickness: 537. Progression has been stable. Findings include pigment epithelial detachment, epiretinal membrane, subretinal hyper-reflective material, retinal drusen , abnormal foveal contour, no IRF, no SRF (Interval imporovement in trace IRF, sub-retinal scar--stable from prior, ORT).   Left Eye Quality was good. Central Foveal Thickness: 375. Progression has been stable. Findings include subretinal hyper-reflective material, pigment epithelial detachment, subretinal fluid, intraretinal fluid, retinal drusen , abnormal foveal contour (Interval  improvement in IRF, persistent SRF, RPE tear).   Notes *Images captured and stored on drive  Diagnosis / Impression:  Exudative ARMD OU OD: subretinal disciform scar stable; overlying ORT OS: persistent SRF, interval improvement in IRF, persistent PED and RPE tear  Clinical management:  See below  Abbreviations: NFP - Normal foveal profile. CME - cystoid macular edema. PED - pigment epithelial detachment. IRF - intraretinal fluid. SRF - subretinal fluid. EZ - ellipsoid zone. ERM - epiretinal membrane. ORA - outer retinal atrophy. ORT - outer retinal tubulation. SRHM -  subretinal hyper-reflective material        Intravitreal Injection, Pharmacologic Agent - OS - Left Eye       Time Out 08/08/2018. 8:30 AM. Confirmed correct patient, procedure, site, and patient consented.   Anesthesia Topical anesthesia was used. Anesthetic medications included Tetracaine 0.5%, Lidocaine 2%.   Procedure Preparation included 5% betadine to ocular surface, eyelid speculum. A 30 gauge needle was used.   Injection:  2 mg aflibercept 2 MG/0.05ML   NDC: 61755-005-02, Lot: [id number], Expiration date: 07/04/2019   Route: Intravitreal, Site: Left Eye, Waste: 0.05 mL  Post-op Post injection exam found visual acuity of at least counting fingers. The patient tolerated the procedure well. There were no complications. The patient received written and verbal post procedure care education.                 ASSESSMENT/PLAN:    ICD-10-CM   1. Exudative age-related macular degeneration of both eyes with active choroidal neovascularization (HCC) H35.3231 OCT, Retina - OU - Both Eyes    Intravitreal Injection, Pharmacologic Agent - OS - Left Eye    aflibercept (EYLEA) SOLN 2 mg  2. Retinal edema H35.81 OCT, Retina - OU - Both Eyes  3. Posterior vitreous detachment of both eyes H43.813   4. Combined form of age-related cataract, both eyes H25.813     1,2. Exudative age related macular degeneration,  both eyes.    - severe exudative disease with very active CNVM OU at presentation in January 2019  - S/P IVA OD #1 (01.04.19), #2 (02.15.19)  - S/P IVA OS #1 (01.18.19), #2 (02.15.19)  - switched to Eylea 3.18.19 due to severity of disease  - S/P IVE OD #1 (03.18.19), #2 (04.17.19), #3 (05.15.19) -- injections held due to stable disciform scar  - S/P IVE OS #1 (03.18.19), #2 (04.17.19), #3 (05.15.19), #4 (06.12.19), #5 (07.10.19), #6 (08.07.19)  - OCT today persistent IRF/SRF OS  - OD with significant subretinal disciform scar; OS with RPE tear  - recommend IVE OS #7 today and holding IVE OD again today  - will plan to treat OD ~q3-4 mos  - pt wishes to be treated with IVE OS, today (09.04.19)  - RBA of procedure discussed, questions answered  - informed consent obtained and signed  - see procedure note  - Eylea paperwork filled out on 02.15.19 and fully approved through [employer]  - f/u in 4 wks for DFE/OCT/possible injection OU  3. PVD / vitreous syneresis  Discussed findings and prognosis  No RT or RD on 360  exam  Reviewed s/s of RT/RD  Strict return precautions for any such RT/RD signs/symptoms  4. Combined form of age related cataract OU- - The symptoms of cataract, surgical options, and treatments and risks were discussed with patient. - discussed diagnosis and progression - approaching visual significance - monitor for now -- may consider referral soon   Ophthalmic Meds Ordered this visit:  Meds ordered this encounter  Medications  . aflibercept (EYLEA) SOLN 2 mg       Return in about 4 weeks (around 09/05/2018) for F/U Exu AMD OU, DFE, OCT.  There are no Patient Instructions on file for this visit.   Explained the diagnoses, plan, and follow up with the patient and they expressed understanding.  Patient expressed understanding of the importance of proper follow up care.   This document serves as a record of services personally performed by Gardiner Sleeper, MD,  PhD. It was created on their behalf  by Catha Brow, [place], a certified ophthalmic assistant. The creation of this record is the provider's dictation and/or activities during the visit.  Electronically signed by: Catha Brow, COA  09.03.19 8:44 AM   Gardiner Sleeper, M.D., Ph.D. Diseases & Surgery of the Retina and Vitreous [facility]  I have reviewed the above documentation for accuracy and completeness, and I agree with the above. Gardiner Sleeper, M.D., Ph.D. 08/08/18 8:44 AM    Abbreviations: M myopia (nearsighted); A astigmatism; H hyperopia (farsighted); P presbyopia; Mrx spectacle prescription;  CTL contact lenses; OD right eye; OS left eye; OU both eyes  XT exotropia; ET esotropia; PEK punctate epithelial keratitis; PEE punctate epithelial erosions; DES dry eye syndrome; MGD meibomian gland dysfunction; ATs artificial tears; PFAT's preservative free artificial tears; [place] nuclear sclerotic cataract; PSC posterior subcapsular cataract; ERM epi-retinal membrane; PVD posterior vitreous detachment; RD retinal detachment; DM diabetes mellitus; DR diabetic retinopathy; NPDR non-proliferative diabetic retinopathy; PDR proliferative diabetic retinopathy; CSME clinically significant macular edema; DME diabetic macular edema; dbh dot blot hemorrhages; CWS cotton wool spot; POAG primary open angle glaucoma; C/D cup-to-disc ratio; HVF humphrey visual field; GVF goldmann visual field; OCT optical coherence tomography; IOP intraocular pressure; BRVO Branch retinal vein occlusion; CRVO central retinal vein occlusion; CRAO central retinal artery occlusion; BRAO branch retinal artery occlusion; RT retinal tear; SB scleral buckle; PPV pars plana vitrectomy; VH Vitreous hemorrhage; PRP panretinal laser photocoagulation; IVK intravitreal kenalog; VMT vitreomacular traction; MH Macular hole;  NVD neovascularization of the disc; NVE neovascularization elsewhere; AREDS age related eye  disease study; ARMD age related macular degeneration; POAG primary open angle glaucoma; EBMD epithelial/anterior basement membrane dystrophy; ACIOL anterior chamber intraocular lens; IOL intraocular lens; PCIOL posterior chamber intraocular lens; Phaco/IOL phacoemulsification with intraocular lens placement; [place] photorefractive keratectomy; LASIK laser assisted in situ keratomileusis; HTN hypertension; DM diabetes mellitus; COPD chronic obstructive pulmonary disease

## 2018-08-08 ENCOUNTER — Encounter (INDEPENDENT_AMBULATORY_CARE_PROVIDER_SITE_OTHER): Payer: Self-pay | Admitting: Ophthalmology

## 2018-08-08 ENCOUNTER — Ambulatory Visit (INDEPENDENT_AMBULATORY_CARE_PROVIDER_SITE_OTHER): Payer: Medicare HMO | Admitting: Ophthalmology

## 2018-08-08 ENCOUNTER — Other Ambulatory Visit: Payer: Self-pay | Admitting: *Deleted

## 2018-08-08 DIAGNOSIS — H353231 Exudative age-related macular degeneration, bilateral, with active choroidal neovascularization: Secondary | ICD-10-CM

## 2018-08-08 DIAGNOSIS — H43813 Vitreous degeneration, bilateral: Secondary | ICD-10-CM | POA: Diagnosis not present

## 2018-08-08 DIAGNOSIS — H3581 Retinal edema: Secondary | ICD-10-CM

## 2018-08-08 DIAGNOSIS — H25813 Combined forms of age-related cataract, bilateral: Secondary | ICD-10-CM | POA: Diagnosis not present

## 2018-08-08 DIAGNOSIS — R7989 Other specified abnormal findings of blood chemistry: Secondary | ICD-10-CM

## 2018-08-08 MED ORDER — AFLIBERCEPT 2MG/0.05ML IZ SOLN FOR KALEIDOSCOPE
2.0000 mg | INTRAVITREAL | Status: DC
  Administered 2018-08-08: 2 mg via INTRAVITREAL

## 2018-08-09 ENCOUNTER — Other Ambulatory Visit: Payer: Medicare HMO

## 2018-08-09 DIAGNOSIS — R7989 Other specified abnormal findings of blood chemistry: Secondary | ICD-10-CM | POA: Diagnosis not present

## 2018-08-09 LAB — BMP8+EGFR
BUN/Creatinine Ratio: 21 (ref 12–28)
BUN: 25 mg/dL (ref 8–27)
CALCIUM: 9.5 mg/dL (ref 8.7–10.3)
CO2: 28 mmol/L (ref 20–29)
CREATININE: 1.17 mg/dL — AB (ref 0.57–1.00)
Chloride: 98 mmol/L (ref 96–106)
GFR calc non Af Amer: 47 mL/min/{1.73_m2} — ABNORMAL LOW (ref >59)
GFR, EST AFRICAN AMERICAN: 54 mL/min/{1.73_m2} — AB (ref >59)
GLUCOSE: 97 mg/dL (ref 65–99)
Potassium: 4.1 mmol/L (ref 3.5–5.2)
Sodium: 141 mmol/L (ref 134–144)

## 2018-08-28 ENCOUNTER — Other Ambulatory Visit: Payer: Self-pay | Admitting: Pediatrics

## 2018-08-28 DIAGNOSIS — M810 Age-related osteoporosis without current pathological fracture: Secondary | ICD-10-CM

## 2018-08-28 MED ORDER — RISEDRONATE SODIUM 150 MG PO TABS: 150.0000 mg | ORAL_TABLET | ORAL | 3 refills | Status: DC

## 2018-08-29 ENCOUNTER — Telehealth: Payer: Self-pay | Admitting: Pediatrics

## 2018-08-29 NOTE — Telephone Encounter (Signed)
Gabrielle [name] had called her to let her know Dr. Evette Doffing had sent in Actonel for her.  Patient advised.

## 2018-08-30 ENCOUNTER — Telehealth: Payer: Self-pay | Admitting: Pediatrics

## 2018-08-30 MED ORDER — ALENDRONATE SODIUM 70 MG PO TABS: 70.0000 mg | ORAL_TABLET | ORAL | 11 refills | Status: DC

## 2018-08-30 NOTE — Telephone Encounter (Signed)
Pt aware.

## 2018-08-30 NOTE — Telephone Encounter (Signed)
Alendronate sent in. Take once a week on empty stomach.

## 2018-09-04 NOTE — Progress Notes (Addendum)
[facility] Note  09/05/2018     CHIEF COMPLAINT Patient presents for Retina Follow Up   HISTORY OF PRESENT ILLNESS: Gabrielle [name] is a 72 y.o. female who presents to the clinic today for:   HPI    Retina Follow Up    Patient presents with  Wet AMD.  In both eyes.  Severity is mild.  Since onset it is stable.  I, the attending physician,  performed the HPI with the patient and updated documentation appropriately.          Comments    F/U EXU AMD.  Patient states "she can see things more clearly at times", denies new visual issues. Pt reports her BP medication has been increased.Pt is ready for tx if indicted.       Last edited by Bernarda Caffey, MD on 09/05/2018  8:45 AM. (History)      Referring physician: Eustaquio Maize, MD [place], [place] [address]  HISTORICAL INFORMATION:   Selected notes from the MEDICAL RECORD NUMBER Referred by Dr. Cristela Blue for concern of ARMD OU;  LEE- 01.03.19 Cristela Blue) [BCVA OD: 10/50 OS: 20/80] Ocular Hx- ARMD OU, DME PMH- elevated chol., HTN    CURRENT MEDICATIONS: No current outpatient medications on file. (Ophthalmic Drugs)   Current Facility-Administered Medications (Ophthalmic Drugs)  Medication Route  . aflibercept (EYLEA) SOLN 2 mg Intravitreal  . aflibercept (EYLEA) SOLN 2 mg Intravitreal  . aflibercept (EYLEA) SOLN 2 mg Intravitreal  . aflibercept (EYLEA) SOLN 2 mg Intravitreal  . aflibercept (EYLEA) SOLN 2 mg Intravitreal  . aflibercept (EYLEA) SOLN 2 mg Intravitreal  . aflibercept (EYLEA) SOLN 2 mg Intravitreal  . aflibercept (EYLEA) SOLN 2 mg Intravitreal  . aflibercept (EYLEA) SOLN 2 mg Intravitreal  . aflibercept (EYLEA) SOLN 2 mg Intravitreal   Current Outpatient Medications (Other)  Medication Sig  . alendronate (FOSAMAX) 70 MG tablet Take 1 tablet (70 mg total) by mouth every 7 (seven) days. Take with a full glass of water on an empty stomach.  Marland Kitchen amLODipine (NORVASC) 10 MG  tablet TAKE 1 TABLET DAILY  . cholecalciferol (VITAMIN D) 1000 units tablet Take 1,000 Units by mouth daily.  Marland Kitchen losartan-hydrochlorothiazide (HYZAAR) 100-25 MG tablet Take 1 tablet by mouth daily.  . pravastatin (PRAVACHOL) 40 MG tablet Take 1 tablet (40 mg total) by mouth daily.   Current Facility-Administered Medications (Other)  Medication Route  . Bevacizumab (AVASTIN) SOLN 1.25 mg Intravitreal  . Bevacizumab (AVASTIN) SOLN 1.25 mg Intravitreal      REVIEW OF SYSTEMS: ROS    Positive for: Eyes   Negative for: Constitutional, Gastrointestinal, Neurological, Skin, Genitourinary, Musculoskeletal, HENT, Endocrine, Cardiovascular, Respiratory, Psychiatric, Allergic/Imm, Heme/Lymph   Last edited by Zenovia Jordan, LPN on 64/02/3294  1:88 AM. (History)       ALLERGIES Allergies  Allergen Reactions  . Lisinopril     cough    PAST MEDICAL HISTORY Past Medical History:  Diagnosis Date  . H/O cesarean section   . Hx of tonsillectomy   . Hyperlipidemia   . Hypertension    Past Surgical History:  Procedure Laterality Date  . CARPAL TUNNEL RELEASE Right   . CESAREAN SECTION    . TONSILLECTOMY      FAMILY HISTORY Family History  Problem Relation Age of Onset  . Macular degeneration Mother   . Stroke Mother   . Hypertension Mother   . Dementia Mother   . Cancer Father  lung  . Diabetes Sister   . Hypertension Sister     SOCIAL HISTORY Social History   Tobacco Use  . Smoking status: Never Smoker  . Smokeless tobacco: Never Used  Substance Use Topics  . Alcohol use: No    Frequency: Never  . Drug use: No         OPHTHALMIC EXAM:  Base Eye Exam    Visual Acuity (Snellen - Linear)      Right Left   Dist cc 20/100 -1 20/100 -2   Dist ph cc 20/100 20/100 -1   Correction:  Glasses       Tonometry (Tonopen, 8:14 AM)      Right Left   Pressure 17 18       Pupils      Dark Light Shape React APD   Right 4 3 Round Brisk None   Left 4 3 Round  Brisk None       Visual Fields (Counting fingers)      Left Right    Full Full       Extraocular Movement      Right Left    Full, Ortho Full, Ortho       Neuro/Psych    Oriented x3:  Yes   Mood/Affect:  Normal       Dilation    Both eyes:  1.0% Mydriacyl, 2.5% Phenylephrine @ 8:15 AM        Slit Lamp and Fundus Exam    Slit Lamp Exam      Right Left   Lids/Lashes Dermatochalasis - upper lid Dermatochalasis - upper lid   Conjunctiva/Sclera White and quiet White and quiet   Cornea Arcus, 1+ Punctate epithelial erosions Arcus, 2+ Punctate epithelial erosions   Anterior Chamber Deep and quiet Deep and quiet   Iris Round and well dilated Round and well dilated   Lens 2-3+ Nuclear sclerosis, 2+ Cortical cataract, trace Posterior subcapsular cataract centrally 2-3+ Nuclear sclerosis, 2+ Cortical cataract, trace Posterior subcapsular cataract centrally   Vitreous Vitreous syneresis, Posterior vitreous detachment Vitreous syneresis, Posterior vitreous detachment       Fundus Exam      Right Left   Disc Normal Normal   C/D Ratio 0.35 0.35   Macula Blunted foveal reflex, Drusen, Pigment clumping and atrophy, central thickening/disciform scar, scattered Exudates improved, +PED, interval development of cystic change, no heme Blunted foveal reflex, central thickening, RPE clumping, Drusen, RPE rip, mild residual cystic changes overlying PED, no heme   Vessels Vascular attenuation Vascular attenuation   Periphery Attached, mild Reticular degeneration Attached, mild Reticular degeneration          IMAGING AND PROCEDURES  Imaging and Procedures for 03/21/18  OCT, Retina - OU - Both Eyes       Right Eye Quality was good. Central Foveal Thickness: 559. Progression has worsened. Findings include pigment epithelial detachment, epiretinal membrane, subretinal hyper-reflective material, retinal drusen , abnormal foveal contour, no SRF, intraretinal fluid (Interval increase in IRF,  subretinal scar--stable from prior, ORT).   Left Eye Quality was good. Central Foveal Thickness: 375. Progression has improved. Findings include subretinal hyper-reflective material, pigment epithelial detachment, subretinal fluid, intraretinal fluid, retinal drusen , abnormal foveal contour (Trace interval improvement in IRF/SRF; RPE tear).   Notes *Images captured and stored on drive  Diagnosis / Impression:  Exudative ARMD OU OD: subretinal disciform scar stable; interval redevelopment of IRF OS: trace improvement in IRF/SRF, persistent PED and RPE tear  Clinical management:  See  below  Abbreviations: NFP - Normal foveal profile. CME - cystoid macular edema. PED - pigment epithelial detachment. IRF - intraretinal fluid. SRF - subretinal fluid. EZ - ellipsoid zone. ERM - epiretinal membrane. ORA - outer retinal atrophy. ORT - outer retinal tubulation. SRHM - subretinal hyper-reflective material        Intravitreal Injection, Pharmacologic Agent - OS - Left Eye       Time Out 09/05/2018. 8:46 AM. Confirmed correct patient, procedure, site, and patient consented.   Anesthesia Anesthetic medications included Lidocaine 2%, Proparacaine 0.5%.   Procedure Preparation included eyelid speculum, 5% betadine to ocular surface. A 30 gauge needle was used.   Injection:  2 mg aflibercept 2 MG/0.05ML   NDC: 61755-005-02, Lot: [id number], Expiration date: 08/04/2019   Route: Intravitreal, Site: Left Eye, Waste: 0.05 mL  Post-op Post injection exam found visual acuity of at least counting fingers. The patient tolerated the procedure well. There were no complications. The patient received written and verbal post procedure care education.        Intravitreal Injection, Pharmacologic Agent - OD - Right Eye       Time Out 09/05/2018. 8:53 AM.   Anesthesia Anesthetic medications included Lidocaine 2%, Tetracaine 0.5%.   Procedure Preparation included eyelid speculum, 5% betadine to  ocular surface. A 27 gauge needle was used.   Injection:  2 mg aflibercept 2 MG/0.05ML   NDC: 61755-005-02, Lot: [id number], Expiration date: 07/04/2019   Route: Intravitreal, Site: Right Eye, Waste: 0.05 mL  Post-op Post injection exam found visual acuity of at least counting fingers. The patient tolerated the procedure well. There were no complications. The patient received written and verbal post procedure care education.                 ASSESSMENT/PLAN:    ICD-10-CM   1. Exudative age-related macular degeneration of both eyes with active choroidal neovascularization (HCC) H35.3231 OCT, Retina - OU - Both Eyes    Intravitreal Injection, Pharmacologic Agent - OS - Left Eye    Intravitreal Injection, Pharmacologic Agent - OD - Right Eye  2. Retinal edema H35.81 OCT, Retina - OU - Both Eyes  3. Posterior vitreous detachment of both eyes H43.813   4. Combined form of age-related cataract, both eyes H25.813     1,2. Exudative age related macular degeneration, both eyes.    - severe exudative disease with very active CNVM OU at presentation in January 2019  - S/P IVA OD #1 (01.04.19), #2 (02.15.19)  - S/P IVA OS #1 (01.18.19), #2 (02.15.19)  - switched to Eylea 3.18.19 due to severity of disease  - S/P IVE OD #1 (03.18.19), #2 (04.17.19), #3 (05.15.19) -- injections held due to stable disciform scar  - S/P IVE OS #1 (03.18.19), #2 (04.17.19), #3 (05.15.19), #4 (06.12.19), #5 (07.10.19), #6 (08.07.19), #7 (09.04.19)  - OCT today persistent IRF/SRF OS  - OD with significant subretinal disciform scar; OS with RPE tear  - recommend IVE OS #8 and IVE OD #4 today  - will plan to treat OD ~q4 mos  - pt wishes to be treated with IVE OU, today (10.02.19)  - RBA of procedure discussed, questions answered  - informed consent obtained and signed  - see procedure note  - Eylea paperwork filled out on 02.15.19 and fully approved through [employer]  - f/u in 5 wks for DFE/OCT/possible  injection OU  3. PVD / vitreous syneresis  Discussed findings and prognosis  No RT or RD on 360  exam  Reviewed s/s of RT/RD  Strict return precautions for any such RT/RD signs/symptoms  4. Combined form of age related cataract OU- - The symptoms of cataract, surgical options, and treatments and risks were discussed with patient. - discussed diagnosis and progression - approaching visual significance - monitor for now -- may consider referral soon   Ophthalmic Meds Ordered this visit:  No orders of the defined types were placed in this encounter.      Return in about 5 weeks (around 10/10/2018) for F/U Exu AMD OU, DFE, OCT.  There are no Patient Instructions on file for this visit.   Explained the diagnoses, plan, and follow up with the patient and they expressed understanding.  Patient expressed understanding of the importance of proper follow up care.   This document serves as a record of services personally performed by Gardiner Sleeper, MD, PhD. It was created on their behalf by Catha Brow, [place], a certified ophthalmic assistant. The creation of this record is the provider's dictation and/or activities during the visit.  Electronically signed by: Catha Brow, COA  10.01.19 8:58 AM   Gardiner Sleeper, M.D., Ph.D. Diseases & Surgery of the Retina and Vitreous [facility] [place]   I have reviewed the above documentation for accuracy and completeness, and I agree with the above. Gardiner Sleeper, M.D., Ph.D. 09/05/18 8:58 AM    Abbreviations: M myopia (nearsighted); A astigmatism; H hyperopia (farsighted); P presbyopia; Mrx spectacle prescription;  CTL contact lenses; OD right eye; OS left eye; OU both eyes  XT exotropia; ET esotropia; PEK punctate epithelial keratitis; PEE punctate epithelial erosions; DES dry eye syndrome; MGD meibomian gland dysfunction; ATs artificial tears; PFAT's preservative free artificial tears; [place] nuclear sclerotic cataract;  PSC posterior subcapsular cataract; ERM epi-retinal membrane; PVD posterior vitreous detachment; RD retinal detachment; DM diabetes mellitus; DR diabetic retinopathy; NPDR non-proliferative diabetic retinopathy; PDR proliferative diabetic retinopathy; CSME clinically significant macular edema; DME diabetic macular edema; dbh dot blot hemorrhages; CWS cotton wool spot; POAG primary open angle glaucoma; C/D cup-to-disc ratio; HVF humphrey visual field; GVF goldmann visual field; OCT optical coherence tomography; IOP intraocular pressure; BRVO Branch retinal vein occlusion; CRVO central retinal vein occlusion; CRAO central retinal artery occlusion; BRAO branch retinal artery occlusion; RT retinal tear; SB scleral buckle; PPV pars plana vitrectomy; VH Vitreous hemorrhage; PRP panretinal laser photocoagulation; IVK intravitreal kenalog; VMT vitreomacular traction; MH Macular hole;  NVD neovascularization of the disc; NVE neovascularization elsewhere; AREDS age related eye disease study; ARMD age related macular degeneration; POAG primary open angle glaucoma; EBMD epithelial/anterior basement membrane dystrophy; ACIOL anterior chamber intraocular lens; IOL intraocular lens; PCIOL posterior chamber intraocular lens; Phaco/IOL phacoemulsification with intraocular lens placement; [place] photorefractive keratectomy; LASIK laser assisted in situ keratomileusis; HTN hypertension; DM diabetes mellitus; COPD chronic obstructive pulmonary disease

## 2018-09-05 ENCOUNTER — Encounter (INDEPENDENT_AMBULATORY_CARE_PROVIDER_SITE_OTHER): Payer: Self-pay | Admitting: Ophthalmology

## 2018-09-05 ENCOUNTER — Ambulatory Visit (INDEPENDENT_AMBULATORY_CARE_PROVIDER_SITE_OTHER): Payer: Medicare HMO | Admitting: Ophthalmology

## 2018-09-05 DIAGNOSIS — H3581 Retinal edema: Secondary | ICD-10-CM | POA: Diagnosis not present

## 2018-09-05 DIAGNOSIS — H43813 Vitreous degeneration, bilateral: Secondary | ICD-10-CM

## 2018-09-05 DIAGNOSIS — H353231 Exudative age-related macular degeneration, bilateral, with active choroidal neovascularization: Secondary | ICD-10-CM | POA: Diagnosis not present

## 2018-09-05 DIAGNOSIS — H25813 Combined forms of age-related cataract, bilateral: Secondary | ICD-10-CM

## 2018-09-05 MED ORDER — AFLIBERCEPT 2MG/0.05ML IZ SOLN FOR KALEIDOSCOPE
2.0000 mg | INTRAVITREAL | Status: DC
  Administered 2018-09-05: 2 mg via INTRAVITREAL

## 2018-10-08 NOTE — Progress Notes (Addendum)
[facility] Note  10/10/2018     CHIEF COMPLAINT Patient presents for Retina Follow Up   HISTORY OF PRESENT ILLNESS: Gabrielle [name] is a 72 y.o. female who presents to the clinic today for:   HPI    Retina Follow Up    Patient presents with  Wet AMD.  In both eyes.  This started months ago.  Severity is moderate.  Duration of months.  Since onset it is stable.  I, the attending physician,  performed the HPI with the patient and updated documentation appropriately.          Comments    72 y/o female pt here for 5 wk f/u for wet amd w/active choroidal neovascularization OU.  No change in [place] OU.  Denies pain, flashes, floaters.  No gtts.       Last edited by Bernarda Caffey, MD on 10/10/2018  8:53 AM. (History)      Referring physician: Eustaquio Maize, MD [place], [place] [address]  HISTORICAL INFORMATION:   Selected notes from the MEDICAL RECORD NUMBER Referred by Dr. Cristela Blue for concern of ARMD OU;  LEE- 01.03.19 Cristela Blue) [BCVA OD: 10/50 OS: 20/80] Ocular Hx- ARMD OU, DME PMH- elevated chol., HTN    CURRENT MEDICATIONS: No current outpatient medications on file. (Ophthalmic Drugs)   Current Facility-Administered Medications (Ophthalmic Drugs)  Medication Route  . aflibercept (EYLEA) SOLN 2 mg Intravitreal  . aflibercept (EYLEA) SOLN 2 mg Intravitreal  . aflibercept (EYLEA) SOLN 2 mg Intravitreal  . aflibercept (EYLEA) SOLN 2 mg Intravitreal  . aflibercept (EYLEA) SOLN 2 mg Intravitreal  . aflibercept (EYLEA) SOLN 2 mg Intravitreal  . aflibercept (EYLEA) SOLN 2 mg Intravitreal  . aflibercept (EYLEA) SOLN 2 mg Intravitreal  . aflibercept (EYLEA) SOLN 2 mg Intravitreal  . aflibercept (EYLEA) SOLN 2 mg Intravitreal  . aflibercept (EYLEA) SOLN 2 mg Intravitreal  . aflibercept (EYLEA) SOLN 2 mg Intravitreal  . aflibercept (EYLEA) SOLN 2 mg Intravitreal   Current Outpatient Medications (Other)  Medication Sig  . alendronate  (FOSAMAX) 70 MG tablet Take 1 tablet (70 mg total) by mouth every 7 (seven) days. Take with a full glass of water on an empty stomach.  Marland Kitchen amLODipine (NORVASC) 10 MG tablet TAKE 1 TABLET DAILY  . cholecalciferol (VITAMIN D) 1000 units tablet Take 1,000 Units by mouth daily.  Marland Kitchen losartan-hydrochlorothiazide (HYZAAR) 100-25 MG tablet Take 1 tablet by mouth daily.  . pravastatin (PRAVACHOL) 40 MG tablet Take 1 tablet (40 mg total) by mouth daily.   Current Facility-Administered Medications (Other)  Medication Route  . Bevacizumab (AVASTIN) SOLN 1.25 mg Intravitreal  . Bevacizumab (AVASTIN) SOLN 1.25 mg Intravitreal      REVIEW OF SYSTEMS: ROS    Positive for: Cardiovascular, Eyes   Negative for: Constitutional, Gastrointestinal, Neurological, Skin, Genitourinary, Musculoskeletal, HENT, Endocrine, Respiratory, Psychiatric, Allergic/Imm, Heme/Lymph   Last edited by Matthew Folks, COA on 10/10/2018  8:41 AM. (History)       ALLERGIES Allergies  Allergen Reactions  . Lisinopril     cough    PAST MEDICAL HISTORY Past Medical History:  Diagnosis Date  . H/O cesarean section   . Hx of tonsillectomy   . Hyperlipidemia   . Hypertension    Past Surgical History:  Procedure Laterality Date  . CARPAL TUNNEL RELEASE Right   . CESAREAN SECTION    . TONSILLECTOMY      FAMILY HISTORY Family History  Problem Relation Age of Onset  . Macular degeneration Mother   . Stroke Mother   . Hypertension Mother   . Dementia Mother   . Cancer Father        lung  . Diabetes Sister   . Hypertension Sister     SOCIAL HISTORY Social History   Tobacco Use  . Smoking status: Never Smoker  . Smokeless tobacco: Never Used  Substance Use Topics  . Alcohol use: No    Frequency: Never  . Drug use: No         OPHTHALMIC EXAM:  Base Eye Exam    Visual Acuity (Snellen - Linear)      Right Left   Dist cc 20/150 - 20/100 -2   Dist ph cc NI NI       Tonometry (Tonopen, 8:39 AM)       Right Left   Pressure 16 13       Pupils      Dark Light Shape React APD   Right 4 3 Round Brisk None   Left 4 3 Round Brisk None       Visual Fields (Counting fingers)      Left Right    Full Full       Extraocular Movement      Right Left    Full, Ortho Full, Ortho       Neuro/Psych    Oriented x3:  Yes   Mood/Affect:  Normal       Dilation    Both eyes:  1.0% Mydriacyl, 2.5% Phenylephrine @ 8:39 AM        Slit Lamp and Fundus Exam    Slit Lamp Exam      Right Left   Lids/Lashes Dermatochalasis - upper lid Dermatochalasis - upper lid   Conjunctiva/Sclera White and quiet White and quiet   Cornea Arcus, 1+ Punctate epithelial erosions Arcus, 2+ Punctate epithelial erosions   Anterior Chamber Deep and quiet Deep and quiet   Iris Round and well dilated Round and well dilated   Lens 2-3+ Nuclear sclerosis, 2+ Cortical cataract, trace Posterior subcapsular cataract centrally 2-3+ Nuclear sclerosis, 2+ Cortical cataract, trace Posterior subcapsular cataract centrally   Vitreous Vitreous syneresis, Posterior vitreous detachment Vitreous syneresis, Posterior vitreous detachment       Fundus Exam      Right Left   Disc Normal Normal   C/D Ratio 0.35 0.35   Macula Blunted foveal reflex, Drusen, Pigment clumping and atrophy, central thickening/disciform scar, +PED, mild persistent cystic change, no heme Blunted foveal reflex, central thickening, RPE clumping and atrophy, Drusen, RPE rip, mild interval increase in IRF/SRF overlying PED, no heme   Vessels Vascular attenuation Vascular attenuation   Periphery Attached, mild Reticular degeneration Attached, mild Reticular degeneration          IMAGING AND PROCEDURES  Imaging and Procedures for 03/21/18  OCT, Retina - OU - Both Eyes       Right Eye Quality was good. Central Foveal Thickness: 528. Progression has been stable. Findings include pigment epithelial detachment, epiretinal membrane, subretinal  hyper-reflective material, retinal drusen , abnormal foveal contour, no SRF, intraretinal fluid (Interval increase in IRF, subretinal scar--stable from prior, ORT).   Left Eye Quality was good. Central Foveal Thickness: 374. Progression has worsened. Findings include subretinal hyper-reflective material, pigment epithelial detachment, subretinal fluid, intraretinal fluid, retinal drusen , abnormal foveal contour (Interval increase in IRF/SRF; RPE tear).   Notes *Images captured and stored on drive  Diagnosis /  Impression:  Exudative ARMD OU OD: subretinal disciform scar stable; tr persistent IRF OS: interval inc in IRF/SRF, persistent PED and RPE tear  Clinical management:  See below  Abbreviations: NFP - Normal foveal profile. CME - cystoid macular edema. PED - pigment epithelial detachment. IRF - intraretinal fluid. SRF - subretinal fluid. EZ - ellipsoid zone. ERM - epiretinal membrane. ORA - outer retinal atrophy. ORT - outer retinal tubulation. SRHM - subretinal hyper-reflective material        Intravitreal Injection, Pharmacologic Agent - OS - Left Eye       Time Out 10/10/2018. 8:46 AM. Confirmed correct patient, procedure, site, and patient consented.   Anesthesia Topical anesthesia was used. Anesthetic medications included Lidocaine 2%, Proparacaine 0.5%.   Procedure Preparation included 5% betadine to ocular surface, eyelid speculum. A 30 gauge needle was used.   Injection:  2 mg aflibercept 2 MG/0.05ML   NDC: 61755-005-02, Lot: [id number], Expiration date: 09/04/2019   Route: Intravitreal, Site: Left Eye, Waste: 0.05 mL  Post-op Post injection exam found visual acuity of at least counting fingers. The patient tolerated the procedure well. There were no complications. The patient received written and verbal post procedure care education.                 ASSESSMENT/PLAN:    ICD-10-CM   1. Exudative age-related macular degeneration of both eyes with active  choroidal neovascularization (HCC) H35.3231 Intravitreal Injection, Pharmacologic Agent - OS - Left Eye    aflibercept (EYLEA) SOLN 2 mg    CANCELED: Intravitreal Injection, Pharmacologic Agent - OD - Right Eye  2. Retinal edema H35.81 OCT, Retina - OU - Both Eyes  3. Posterior vitreous detachment of both eyes H43.813   4. Combined form of age-related cataract, both eyes H25.813     1,2. Exudative age related macular degeneration, both eyes.    - severe exudative disease with very active CNVM OU at presentation in January 2019  - S/P IVA OD #1 (01.04.19), #2 (02.15.19)  - S/P IVA OS #1 (01.18.19), #2 (02.15.19)  - switched to Eylea 3.18.19 due to severity of disease  - S/P IVE OD #1 (03.18.19), #2 (04.17.19), #3 (05.15.19) -- injections held due to stable disciform scar, #4 (10.02.19) -- now on a q3-4 month interval schedule  - S/P IVE OS #1 (03.18.19), #2 (04.17.19), #3 (05.15.19), #4 (06.12.19), #5 (07.10.19), #6 (08.07.19), #7 (09.04.19), #8 (10.02.19)  - OCT today interval increase in IRF/SRF OS  - OD with significant subretinal disciform scar - stable from prior; OS with RPE tear  - will plan to treat OD ~q3-4 mos -- next treatment tentatively Jan/Feb 2020  - recommend IVE OS #9 today  - pt wishes to be treated with IVE OS, today (10.02.19)  - RBA of procedure discussed, questions answered  - informed consent obtained and signed  - see procedure note  - Eylea paperwork filled out on 02.15.19 and fully approved through [employer]  - f/u in 5 wks for DFE/OCT/possible injection  3. PVD / vitreous syneresis  Discussed findings and prognosis  No RT or RD on 360  exam  Reviewed s/s of RT/RD  Strict return precautions for any such RT/RD signs/symptoms  4. Combined form of age related cataract OU- - The symptoms of cataract, surgical options, and treatments and risks were discussed with patient. - discussed diagnosis and progression - approaching visual significance - monitor for now  -- may consider referral soon - pt wishes for referral to Dr. Geoffry Paradise  once ready -- [phone] ext [phone] for Lynnae Prude, Dr. [facility] Surgical Coordinator   Ophthalmic Meds Ordered this visit:  Meds ordered this encounter  Medications  . aflibercept (EYLEA) SOLN 2 mg       Return in about 5 weeks (around 11/14/2018) for Exu ARMD OU, DFE, OCT.  There are no Patient Instructions on file for this visit.   Explained the diagnoses, plan, and follow up with the patient and they expressed understanding.  Patient expressed understanding of the importance of proper follow up care.   This document serves as a record of services personally performed by Gardiner Sleeper, MD, PhD. It was created on their behalf by Ernest Mallick, OA, an ophthalmic assistant. The creation of this record is the provider's dictation and/or activities during the visit.    Electronically signed by: Ernest Mallick, OA  11.04.19 12:36 PM    Gardiner Sleeper, M.D., Ph.D. Diseases & Surgery of the Retina and Vitreous [facility]   I have reviewed the above documentation for accuracy and completeness, and I agree with the above. Gardiner Sleeper, M.D., Ph.D. 10/10/18 12:38 PM     Abbreviations: M myopia (nearsighted); A astigmatism; H hyperopia (farsighted); P presbyopia; Mrx spectacle prescription;  CTL contact lenses; OD right eye; OS left eye; OU both eyes  XT exotropia; ET esotropia; PEK punctate epithelial keratitis; PEE punctate epithelial erosions; DES dry eye syndrome; MGD meibomian gland dysfunction; ATs artificial tears; PFAT's preservative free artificial tears; [place] nuclear sclerotic cataract; PSC posterior subcapsular cataract; ERM epi-retinal membrane; PVD posterior vitreous detachment; RD retinal detachment; DM diabetes mellitus; DR diabetic retinopathy; NPDR non-proliferative diabetic retinopathy; PDR proliferative diabetic retinopathy; CSME clinically significant macular edema; DME diabetic  macular edema; dbh dot blot hemorrhages; CWS cotton wool spot; POAG primary open angle glaucoma; C/D cup-to-disc ratio; HVF humphrey visual field; GVF goldmann visual field; OCT optical coherence tomography; IOP intraocular pressure; BRVO Branch retinal vein occlusion; CRVO central retinal vein occlusion; CRAO central retinal artery occlusion; BRAO branch retinal artery occlusion; RT retinal tear; SB scleral buckle; PPV pars plana vitrectomy; VH Vitreous hemorrhage; PRP panretinal laser photocoagulation; IVK intravitreal kenalog; VMT vitreomacular traction; MH Macular hole;  NVD neovascularization of the disc; NVE neovascularization elsewhere; AREDS age related eye disease study; ARMD age related macular degeneration; POAG primary open angle glaucoma; EBMD epithelial/anterior basement membrane dystrophy; ACIOL anterior chamber intraocular lens; IOL intraocular lens; PCIOL posterior chamber intraocular lens; Phaco/IOL phacoemulsification with intraocular lens placement; [place] photorefractive keratectomy; LASIK laser assisted in situ keratomileusis; HTN hypertension; DM diabetes mellitus; COPD chronic obstructive pulmonary disease

## 2018-10-10 ENCOUNTER — Ambulatory Visit (INDEPENDENT_AMBULATORY_CARE_PROVIDER_SITE_OTHER): Payer: Medicare HMO | Admitting: Ophthalmology

## 2018-10-10 ENCOUNTER — Encounter (INDEPENDENT_AMBULATORY_CARE_PROVIDER_SITE_OTHER): Payer: Self-pay | Admitting: Ophthalmology

## 2018-10-10 DIAGNOSIS — H3581 Retinal edema: Secondary | ICD-10-CM | POA: Diagnosis not present

## 2018-10-10 DIAGNOSIS — H25813 Combined forms of age-related cataract, bilateral: Secondary | ICD-10-CM | POA: Diagnosis not present

## 2018-10-10 DIAGNOSIS — H353231 Exudative age-related macular degeneration, bilateral, with active choroidal neovascularization: Secondary | ICD-10-CM

## 2018-10-10 DIAGNOSIS — H43813 Vitreous degeneration, bilateral: Secondary | ICD-10-CM

## 2018-10-10 MED ORDER — AFLIBERCEPT 2MG/0.05ML IZ SOLN FOR KALEIDOSCOPE
2.0000 mg | INTRAVITREAL | Status: DC
  Administered 2018-10-10: 2 mg via INTRAVITREAL

## 2018-11-12 NOTE — Progress Notes (Signed)
[facility] Note  11/14/2018     CHIEF COMPLAINT Patient presents for Retina Follow Up   HISTORY OF PRESENT ILLNESS: Gabrielle [name] is a 72 y.o. female who presents to the clinic today for:   HPI    Retina Follow Up    Patient presents with  Wet AMD.  In both eyes.  This started 5 weeks ago.  Duration of 5 weeks.  Since onset it is stable.  I, the attending physician,  performed the HPI with the patient and updated documentation appropriately.          Comments    Patient here for 5 weeks Exu ARMD OU. Patiet states some days are pretty good. Other days not. Has no eye pain.       Last edited by Bernarda Caffey, MD on 11/14/2018  8:20 AM. (History)    pt states there is no significant change in the vision in her left eye,   Referring physician: Thalia Bloodgood, OD No address on file  HISTORICAL INFORMATION:   Selected notes from the MEDICAL RECORD NUMBER Referred by Dr. Cristela Blue for concern of ARMD OU;  LEE- 01.03.19 Cristela Blue) [BCVA OD: 10/50 OS: 20/80] Ocular Hx- ARMD OU, DME PMH- elevated chol., HTN    CURRENT MEDICATIONS: No current outpatient medications on file. (Ophthalmic Drugs)   Current Facility-Administered Medications (Ophthalmic Drugs)  Medication Route  . aflibercept (EYLEA) SOLN 2 mg Intravitreal  . aflibercept (EYLEA) SOLN 2 mg Intravitreal  . aflibercept (EYLEA) SOLN 2 mg Intravitreal  . aflibercept (EYLEA) SOLN 2 mg Intravitreal  . aflibercept (EYLEA) SOLN 2 mg Intravitreal  . aflibercept (EYLEA) SOLN 2 mg Intravitreal  . aflibercept (EYLEA) SOLN 2 mg Intravitreal  . aflibercept (EYLEA) SOLN 2 mg Intravitreal  . aflibercept (EYLEA) SOLN 2 mg Intravitreal  . aflibercept (EYLEA) SOLN 2 mg Intravitreal  . aflibercept (EYLEA) SOLN 2 mg Intravitreal  . aflibercept (EYLEA) SOLN 2 mg Intravitreal  . aflibercept (EYLEA) SOLN 2 mg Intravitreal  . aflibercept (EYLEA) SOLN 2 mg Intravitreal   Current Outpatient Medications  (Other)  Medication Sig  . alendronate (FOSAMAX) 70 MG tablet Take 1 tablet (70 mg total) by mouth every 7 (seven) days. Take with a full glass of water on an empty stomach.  Marland Kitchen amLODipine (NORVASC) 10 MG tablet TAKE 1 TABLET DAILY  . cholecalciferol (VITAMIN D) 1000 units tablet Take 1,000 Units by mouth daily.  Marland Kitchen losartan-hydrochlorothiazide (HYZAAR) 100-25 MG tablet Take 1 tablet by mouth daily.  . pravastatin (PRAVACHOL) 40 MG tablet Take 1 tablet (40 mg total) by mouth daily.   Current Facility-Administered Medications (Other)  Medication Route  . Bevacizumab (AVASTIN) SOLN 1.25 mg Intravitreal  . Bevacizumab (AVASTIN) SOLN 1.25 mg Intravitreal      REVIEW OF SYSTEMS: ROS    Positive for: Cardiovascular, Eyes   Negative for: Constitutional, Gastrointestinal, Neurological, Skin, Genitourinary, Musculoskeletal, HENT, Endocrine, Respiratory, Psychiatric, Allergic/Imm, Heme/Lymph   Last edited by Bernarda Caffey, MD on 11/14/2018  8:20 AM. (History)       ALLERGIES Allergies  Allergen Reactions  . Lisinopril     cough    PAST MEDICAL HISTORY Past Medical History:  Diagnosis Date  . H/O cesarean section   . Hx of tonsillectomy   . Hyperlipidemia   . Hypertension    Past Surgical History:  Procedure Laterality Date  . CARPAL TUNNEL RELEASE Right   . CESAREAN SECTION    . TONSILLECTOMY  FAMILY HISTORY Family History  Problem Relation Age of Onset  . Macular degeneration Mother   . Stroke Mother   . Hypertension Mother   . Dementia Mother   . Cancer Father        lung  . Diabetes Sister   . Hypertension Sister     SOCIAL HISTORY Social History   Tobacco Use  . Smoking status: Never Smoker  . Smokeless tobacco: Never Used  Substance Use Topics  . Alcohol use: No    Frequency: Never  . Drug use: No         OPHTHALMIC EXAM:  Base Eye Exam    Visual Acuity (Snellen - Linear)      Right Left   Dist cc 20/100 20/100 -2   Dist ph cc NI NI    Correction:  Glasses       Tonometry (Tonopen, 8:01 AM)      Right Left   Pressure 21 18       Pupils      Dark Light Shape React APD   Right 4 3 Round Brisk None   Left 4 3 Round Brisk None       Visual Fields (Counting fingers)      Left Right    Full Full       Extraocular Movement      Right Left    Full, Ortho Full, Ortho       Neuro/Psych    Oriented x3:  Yes   Mood/Affect:  Normal       Dilation    Both eyes:  1.0% Mydriacyl, 2.5% Phenylephrine @ 8:01 AM        Slit Lamp and Fundus Exam    Slit Lamp Exam      Right Left   Lids/Lashes Dermatochalasis - upper lid Dermatochalasis - upper lid   Conjunctiva/Sclera White and quiet White and quiet   Cornea Arcus, 1+ Punctate epithelial erosions Arcus, 2+ Punctate epithelial erosions   Anterior Chamber Deep and quiet Deep and quiet   Iris Round and well dilated Round and well dilated   Lens 2-3+ Nuclear sclerosis, 2+ Cortical cataract, trace Posterior subcapsular cataract centrally 2-3+ Nuclear sclerosis, 2+ Cortical cataract, trace Posterior subcapsular cataract centrally   Vitreous Vitreous syneresis, Posterior vitreous detachment Vitreous syneresis, Posterior vitreous detachment       Fundus Exam      Right Left   Disc Normal Normal   C/D Ratio 0.35 0.35   Macula Blunted foveal reflex, Drusen, Pigment clumping and atrophy, central thickening/disciform scar, +PED, mild persistent cystic change, no heme Blunted foveal reflex, central thickening, RPE clumping and atrophy, Drusen, RPE rip, persistent IRF/SRF overlying PED, no heme   Vessels Vascular attenuation Vascular attenuation   Periphery Attached, mild Reticular degeneration Attached, mild Reticular degeneration        Refraction    Wearing Rx      Sphere Cylinder Axis Add   Right -3.25 +1.00 158 +2.50   Left -3.50 +0.50 180 +2.50   Age:  5   Type:  PAL          IMAGING AND PROCEDURES  Imaging and Procedures for 03/21/18  OCT, Retina - OU -  Both Eyes       Right Eye Quality was good. Central Foveal Thickness: 595. Progression has been stable. Findings include pigment epithelial detachment, epiretinal membrane, subretinal hyper-reflective material, retinal drusen , abnormal foveal contour, no SRF, intraretinal fluid (Trace Interval increase in IRF, subretinal  scar--stable from prior, ORT).   Left Eye Quality was good. Central Foveal Thickness: 376. Progression has been stable. Findings include subretinal hyper-reflective material, pigment epithelial detachment, subretinal fluid, intraretinal fluid, retinal drusen , abnormal foveal contour (Persistent IRF/SRF; RPE tear -- no significant change from prior).   Notes *Images captured and stored on drive  Diagnosis / Impression:  Exudative ARMD OU OD: subretinal disciform scar stable; tr persistent IRF OS: interval inc in IRF/SRF, persistent PED and RPE tear  Clinical management:  See below  Abbreviations: NFP - Normal foveal profile. CME - cystoid macular edema. PED - pigment epithelial detachment. IRF - intraretinal fluid. SRF - subretinal fluid. EZ - ellipsoid zone. ERM - epiretinal membrane. ORA - outer retinal atrophy. ORT - outer retinal tubulation. SRHM - subretinal hyper-reflective material        Intravitreal Injection, Pharmacologic Agent - OS - Left Eye       Time Out 11/14/2018. 8:58 AM. Confirmed correct patient, procedure, site, and patient consented.   Anesthesia Topical anesthesia was used. Anesthetic medications included Lidocaine 2%, Proparacaine 0.5%.   Procedure Preparation included 5% betadine to ocular surface, eyelid speculum. A 30 gauge needle was used.   Injection:  2 mg aflibercept 2 MG/0.05ML   NDC: 61755-005-02, Lot: [id number], Expiration date: 10/04/2019   Route: Intravitreal, Site: Left Eye, Waste: 0.05 mL  Post-op Post injection exam found visual acuity of at least counting fingers. The patient tolerated the procedure well. There  were no complications. The patient received written and verbal post procedure care education.                 ASSESSMENT/PLAN:    ICD-10-CM   1. Exudative age-related macular degeneration of both eyes with active choroidal neovascularization (HCC) H35.3231 Intravitreal Injection, Pharmacologic Agent - OS - Left Eye    aflibercept (EYLEA) SOLN 2 mg  2. Retinal edema H35.81 OCT, Retina - OU - Both Eyes  3. Posterior vitreous detachment of both eyes H43.813   4. Combined form of age-related cataract, both eyes H25.813     1,2. Exudative age related macular degeneration, both eyes.    - severe exudative disease with very active CNVM OU at presentation in January 2019  - S/P IVA OD #1 (01.04.19), #2 (02.15.19)  - S/P IVA OS #1 (01.18.19), #2 (02.15.19)  - switched to Eylea 3.18.19 due to severity of disease  - S/P IVE OD #1 (03.18.19), #2 (04.17.19), #3 (05.15.19) -- injections held due to stable disciform scar, #4 (10.02.19) -- now on a q3-4 month interval schedule  - S/P IVE OS #1 (03.18.19), #2 (04.17.19), #3 (05.15.19), #4 (06.12.19), #5 (07.10.19), #6 (08.07.19), #7 (09.04.19), #8 (10.02.19), #9 (11.06.19)  - OCT today shows persistent IRF/SRF OS -- no significant change from prior  - OD with significant subretinal disciform scar - stable from prior; OS with RPE tear  - will plan to treat OD ~q3-4 mos -- next treatment tentatively Jan/Feb 2020  - recommend IVE OS #10 today  - pt wishes to be treated with IVE OS, today (12.11.19)  - RBA of procedure discussed, questions answered  - informed consent obtained and signed  - see procedure note  - Eylea paperwork filled out on 02.15.19 and fully approved through [employer]  - f/u in 5 wks for DFE/OCT/possible injection  3. PVD / vitreous syneresis  Discussed findings and prognosis  No RT or RD on 360  exam  Reviewed s/s of RT/RD  Strict return precautions for any such  RT/RD signs/symptoms  4. Combined form of age related cataract  OU- - The symptoms of cataract, surgical options, and treatments and risks were discussed with patient. - discussed diagnosis and progression - approaching visual significance - monitor for now -- may consider referral soon - pt wishes for referral to Dr. Geoffry Paradise once ready -- [phone] ext [phone] for Lynnae Prude, Dr. [facility] Surgical Coordinator   Ophthalmic Meds Ordered this visit:  Meds ordered this encounter  Medications  . aflibercept (EYLEA) SOLN 2 mg       Return in about 5 weeks (around 12/19/2018) for Dilated Exam, OCT, Possible Injxn.  There are no Patient Instructions on file for this visit.   Explained the diagnoses, plan, and follow up with the patient and they expressed understanding.  Patient expressed understanding of the importance of proper follow up care.   This document serves as a record of services personally performed by Gardiner Sleeper, MD, PhD. It was created on their behalf by Ernest Mallick, OA, an ophthalmic assistant. The creation of this record is the provider's dictation and/or activities during the visit.    Electronically signed by: Ernest Mallick, OA  12.09.19 10:38 PM    Gardiner Sleeper, M.D., Ph.D. Diseases & Surgery of the Retina and Vitreous [facility]  I have reviewed the above documentation for accuracy and completeness, and I agree with the above. Gardiner Sleeper, M.D., Ph.D. 11/17/18 10:42 PM    Abbreviations: M myopia (nearsighted); A astigmatism; H hyperopia (farsighted); P presbyopia; Mrx spectacle prescription;  CTL contact lenses; OD right eye; OS left eye; OU both eyes  XT exotropia; ET esotropia; PEK punctate epithelial keratitis; PEE punctate epithelial erosions; DES dry eye syndrome; MGD meibomian gland dysfunction; ATs artificial tears; PFAT's preservative free artificial tears; [place] nuclear sclerotic cataract; PSC posterior subcapsular cataract; ERM epi-retinal membrane; PVD posterior vitreous detachment; RD  retinal detachment; DM diabetes mellitus; DR diabetic retinopathy; NPDR non-proliferative diabetic retinopathy; PDR proliferative diabetic retinopathy; CSME clinically significant macular edema; DME diabetic macular edema; dbh dot blot hemorrhages; CWS cotton wool spot; POAG primary open angle glaucoma; C/D cup-to-disc ratio; HVF humphrey visual field; GVF goldmann visual field; OCT optical coherence tomography; IOP intraocular pressure; BRVO Branch retinal vein occlusion; CRVO central retinal vein occlusion; CRAO central retinal artery occlusion; BRAO branch retinal artery occlusion; RT retinal tear; SB scleral buckle; PPV pars plana vitrectomy; VH Vitreous hemorrhage; PRP panretinal laser photocoagulation; IVK intravitreal kenalog; VMT vitreomacular traction; MH Macular hole;  NVD neovascularization of the disc; NVE neovascularization elsewhere; AREDS age related eye disease study; ARMD age related macular degeneration; POAG primary open angle glaucoma; EBMD epithelial/anterior basement membrane dystrophy; ACIOL anterior chamber intraocular lens; IOL intraocular lens; PCIOL posterior chamber intraocular lens; Phaco/IOL phacoemulsification with intraocular lens placement; [place] photorefractive keratectomy; LASIK laser assisted in situ keratomileusis; HTN hypertension; DM diabetes mellitus; COPD chronic obstructive pulmonary disease

## 2018-11-14 ENCOUNTER — Ambulatory Visit (INDEPENDENT_AMBULATORY_CARE_PROVIDER_SITE_OTHER): Payer: Medicare HMO | Admitting: Ophthalmology

## 2018-11-14 ENCOUNTER — Encounter (INDEPENDENT_AMBULATORY_CARE_PROVIDER_SITE_OTHER): Payer: Self-pay | Admitting: Ophthalmology

## 2018-11-14 DIAGNOSIS — H3581 Retinal edema: Secondary | ICD-10-CM

## 2018-11-14 DIAGNOSIS — H353231 Exudative age-related macular degeneration, bilateral, with active choroidal neovascularization: Secondary | ICD-10-CM

## 2018-11-14 DIAGNOSIS — H43813 Vitreous degeneration, bilateral: Secondary | ICD-10-CM

## 2018-11-14 DIAGNOSIS — H25813 Combined forms of age-related cataract, bilateral: Secondary | ICD-10-CM

## 2018-11-14 MED ORDER — AFLIBERCEPT 2MG/0.05ML IZ SOLN FOR KALEIDOSCOPE
2.0000 mg | INTRAVITREAL | Status: DC
  Administered 2018-11-14: 2 mg via INTRAVITREAL

## 2018-12-18 NOTE — Progress Notes (Signed)
[facility] Note  12/19/2018     CHIEF COMPLAINT Patient presents for Retina Follow Up   HISTORY OF PRESENT ILLNESS: Gabrielle [name] is a 73 y.o. female who presents to the clinic today for:   HPI    Retina Follow Up    Patient presents with  Wet AMD.  In both eyes.  Severity is moderate.  Duration of 5 weeks.  Since onset it is stable.  I, the attending physician,  performed the HPI with the patient and updated documentation appropriately.          Comments    Pt presents for exu ARMD OU f/u, pt states vision seems a little better, pt denies new flashes/floaters or eye pain, pt states she's had allergies over the past couple of days        Last edited by Bernarda Caffey, MD on 12/19/2018  8:43 AM. (History)    pt states vision is about the same, pt has been approved for [employer]  Referring physician: Eustaquio Maize, MD [place], [place] [address]  HISTORICAL INFORMATION:   Selected notes from the [facility] Referred by Dr. Cristela Blue for concern of ARMD OU;  LEE- 01.03.19 Cristela Blue) [BCVA OD: 10/50 OS: 20/80] Ocular Hx- ARMD OU, DME PMH- elevated chol., HTN    CURRENT MEDICATIONS: Current Outpatient Medications (Ophthalmic Drugs)  Medication Sig  . dorzolamide-timolol (COSOPT) 22.3-6.8 MG/ML ophthalmic solution Place 1 drop into both eyes 2 (two) times daily.   Current Facility-Administered Medications (Ophthalmic Drugs)  Medication Route  . aflibercept (EYLEA) SOLN 2 mg Intravitreal  . aflibercept (EYLEA) SOLN 2 mg Intravitreal  . aflibercept (EYLEA) SOLN 2 mg Intravitreal  . aflibercept (EYLEA) SOLN 2 mg Intravitreal  . aflibercept (EYLEA) SOLN 2 mg Intravitreal  . aflibercept (EYLEA) SOLN 2 mg Intravitreal  . aflibercept (EYLEA) SOLN 2 mg Intravitreal  . aflibercept (EYLEA) SOLN 2 mg Intravitreal  . aflibercept (EYLEA) SOLN 2 mg Intravitreal  . aflibercept (EYLEA) SOLN 2 mg Intravitreal  . aflibercept (EYLEA)  SOLN 2 mg Intravitreal  . aflibercept (EYLEA) SOLN 2 mg Intravitreal  . aflibercept (EYLEA) SOLN 2 mg Intravitreal  . aflibercept (EYLEA) SOLN 2 mg Intravitreal  . aflibercept (EYLEA) SOLN 2 mg Intravitreal  . aflibercept (EYLEA) SOLN 2 mg Intravitreal   Current Outpatient Medications (Other)  Medication Sig  . alendronate (FOSAMAX) 70 MG tablet Take 1 tablet (70 mg total) by mouth every 7 (seven) days. Take with a full glass of water on an empty stomach.  Marland Kitchen amLODipine (NORVASC) 10 MG tablet TAKE 1 TABLET DAILY  . cholecalciferol (VITAMIN D) 1000 units tablet Take 1,000 Units by mouth daily.  Marland Kitchen losartan-hydrochlorothiazide (HYZAAR) 100-25 MG tablet Take 1 tablet by mouth daily.  . pravastatin (PRAVACHOL) 40 MG tablet Take 1 tablet (40 mg total) by mouth daily.   Current Facility-Administered Medications (Other)  Medication Route  . Bevacizumab (AVASTIN) SOLN 1.25 mg Intravitreal  . Bevacizumab (AVASTIN) SOLN 1.25 mg Intravitreal      REVIEW OF SYSTEMS: ROS    Positive for: Cardiovascular, Eyes   Negative for: Constitutional, Gastrointestinal, Neurological, Skin, Genitourinary, Musculoskeletal, HENT, Endocrine, Respiratory, Psychiatric, Allergic/Imm, Heme/Lymph   Last edited by Debbrah Alar, COT on 12/19/2018  8:10 AM. (History)       ALLERGIES Allergies  Allergen Reactions  . Lisinopril     cough    PAST MEDICAL HISTORY Past Medical History:  Diagnosis Date  .  H/O cesarean section   . Hx of tonsillectomy   . Hyperlipidemia   . Hypertension    Past Surgical History:  Procedure Laterality Date  . CARPAL TUNNEL RELEASE Right   . CESAREAN SECTION    . TONSILLECTOMY      FAMILY HISTORY Family History  Problem Relation Age of Onset  . Macular degeneration Mother   . Stroke Mother   . Hypertension Mother   . Dementia Mother   . Cancer Father        lung  . Diabetes Sister   . Hypertension Sister     SOCIAL HISTORY Social History   Tobacco Use  .  Smoking status: Never Smoker  . Smokeless tobacco: Never Used  Substance Use Topics  . Alcohol use: No    Frequency: Never  . Drug use: No         OPHTHALMIC EXAM:  Base Eye Exam    Visual Acuity (Snellen - Linear)      Right Left   Dist cc 20/250 -1 20/150 -1   Dist ph cc NI NI   Correction:  Glasses       Tonometry (Tonopen, 8:16 AM)      Right Left   Pressure 26 19       Tonometry #2 (Tonopen, 8:16 AM)      Right Left   Pressure 24        Pupils      Dark Light Shape React APD   Right 4 2 Round Brisk None   Left 4 2 Round Brisk None       Visual Fields (Counting fingers)      Left Right    Full Full       Extraocular Movement      Right Left    Full, Ortho Full, Ortho       Neuro/Psych    Oriented x3:  Yes   Mood/Affect:  Normal       Dilation    Both eyes:  1.0% Mydriacyl, 2.5% Phenylephrine @ 8:16 AM        Slit Lamp and Fundus Exam    Slit Lamp Exam      Right Left   Lids/Lashes Dermatochalasis - upper lid Dermatochalasis - upper lid   Conjunctiva/Sclera White and quiet White and quiet   Cornea Arcus, 1-2+ Punctate epithelial erosions Arcus, 1-2+ Punctate epithelial erosions   Anterior Chamber Deep and quiet Deep and quiet   Iris Round and well dilated Round and well dilated   Lens 3+ Nuclear sclerosis, 3+ Cortical cataract, trace Posterior subcapsular cataract centrally 2-3+ Nuclear sclerosis, 2+ Cortical cataract, trace Posterior subcapsular cataract centrally   Vitreous Vitreous syneresis, Posterior vitreous detachment Vitreous syneresis, Posterior vitreous detachment       Fundus Exam      Right Left   Disc Normal Normal   C/D Ratio 0.2 0.3   Macula Blunted foveal reflex, Drusen, Pigment clumping and atrophy, central thickening/disciform scar, +PED, mild persistent cystic change, no heme Blunted foveal reflex, central thickening, RPE clumping and atrophy, Drusen, RPE rip, persistent IRF/SRF overlying PED, no heme   Vessels Vascular  attenuation Vascular attenuation   Periphery Attached, mild Reticular degeneration Attached, mild Reticular degeneration          IMAGING AND PROCEDURES  Imaging and Procedures for 03/21/18  OCT, Retina - OU - Both Eyes       Right Eye Quality was good. Central Foveal Thickness: 565. Progression has been stable.  Findings include pigment epithelial detachment, epiretinal membrane, subretinal hyper-reflective material, retinal drusen , abnormal foveal contour, no SRF, intraretinal fluid (Interval improvement in cystic changes).   Left Eye Quality was good. Central Foveal Thickness: 354. Progression has been stable. Findings include subretinal hyper-reflective material, pigment epithelial detachment, subretinal fluid, intraretinal fluid, retinal drusen , abnormal foveal contour (Mild interval increase in SRF, +IRF).   Notes *Images captured and stored on drive  Diagnosis / Impression:  Exudative ARMD OU OD: subretinal disciform scar stable; tr persistent IRF OS: interval inc in IRF/SRF, persistent PED and RPE tear  Clinical management:  See below  Abbreviations: NFP - Normal foveal profile. CME - cystoid macular edema. PED - pigment epithelial detachment. IRF - intraretinal fluid. SRF - subretinal fluid. EZ - ellipsoid zone. ERM - epiretinal membrane. ORA - outer retinal atrophy. ORT - outer retinal tubulation. SRHM - subretinal hyper-reflective material        Intravitreal Injection, Pharmacologic Agent - OS - Left Eye       Time Out 12/19/2018. 9:00 AM. Confirmed correct patient, procedure, site, and patient consented.   Anesthesia Topical anesthesia was used. Anesthetic medications included Lidocaine 2%, Proparacaine 0.5%.   Procedure Preparation included 5% betadine to ocular surface, eyelid speculum. A 30 gauge needle was used.   Injection:  2 mg aflibercept Alfonse Flavors) SOLN   NDC: M7179715, Lot: [id number], Expiration date: 11/04/2019   Route: Intravitreal, Site:  Left Eye, Waste: 0.05 mL  Post-op Post injection exam found visual acuity of at least counting fingers. The patient tolerated the procedure well. There were no complications. The patient received written and verbal post procedure care education.        Intravitreal Injection, Pharmacologic Agent - OD - Right Eye       Time Out 12/19/2018. 9:02 AM. Confirmed correct patient, procedure, site, and patient consented.   Anesthesia Topical anesthesia was used. Anesthetic medications included Lidocaine 2%, Proparacaine 0.5%.   Procedure Preparation included 5% betadine to ocular surface, eyelid speculum. A 30 gauge needle was used.   Injection:  2 mg aflibercept Alfonse Flavors) SOLN   NDC: M7179715, Lot: [id number], Expiration date: 11/04/2019   Route: Intravitreal, Site: Right Eye, Waste: 0.05 mL  Post-op Post injection exam found visual acuity of at least counting fingers. The patient tolerated the procedure well. There were no complications. The patient received written and verbal post procedure care education.                 ASSESSMENT/PLAN:    ICD-10-CM   1. Exudative age-related macular degeneration of both eyes with active choroidal neovascularization (HCC) H35.3231 Intravitreal Injection, Pharmacologic Agent - OS - Left Eye    Intravitreal Injection, Pharmacologic Agent - OD - Right Eye    aflibercept (EYLEA) SOLN 2 mg    aflibercept (EYLEA) SOLN 2 mg  2. Retinal edema H35.81 OCT, Retina - OU - Both Eyes  3. Posterior vitreous detachment of both eyes H43.813   4. Combined form of age-related cataract, both eyes H25.813     1,2. Exudative age related macular degeneration, both eyes.    - severe exudative disease with very active CNVM OU at presentation in January 2019  - S/P IVA OD #1 (01.04.19), #2 (02.15.19)  - S/P IVA OS #1 (01.18.19), #2 (02.15.19)  - switched to Eylea 3.18.19 due to severity of disease  - S/P IVE OD #1 (03.18.19), #2 (04.17.19), #3 (05.15.19) --  injections held due to stable disciform scar, #4 (10.02.19) -- now on a  q3-4 month interval schedule  - S/P IVE OS #1 (03.18.19), #2 (04.17.19), #3 (05.15.19), #4 (06.12.19), #5 (07.10.19), #6 (08.07.19), #7 (09.04.19), #8 (10.02.19), #9 (11.06.19), #10 (12.11.19)  - OCT today shows persistent IRF/SRF OS -- no significant change from prior  - OD with significant subretinal disciform scar - stable from prior; OS with RPE tear  - will plan to treat OD ~q3-4 mos -- next treatment tentatively April/May 2020 after today  - recommend IVE OD #5 and OS #11 today  - pt wishes to be treated with IVE OU, today (01.15.20)  - RBA of procedure discussed, questions answered  - informed consent obtained and signed  - see procedure note  - Eylea paperwork filled out on 02.15.19 and fully approved through [facility] [employer] -- renewed 12/2018  - f/u in 5 wks for DFE/OCT/possible injection  3. PVD / vitreous syneresis  Discussed findings and prognosis  No RT or RD on 360  exam  Reviewed s/s of RT/RD  Strict return precautions for any such RT/RD signs/symptoms  4. Combined form of age related cataract OU- - The symptoms of cataract, surgical options, and treatments and risks were discussed with patient. - discussed diagnosis and progression - approaching visual significance, but long discussion about limitations of cataract surgery due to presence of ARMD OU - pt wishes to proceed with referral to Dr. Hassie Bruce-- [phone] ext [phone] for Lynnae Prude, Surgical Coordinator  5. Ocular Hypertension OU - start Cosopt BID OU   Ophthalmic Meds Ordered this visit:  Meds ordered this encounter  Medications  . dorzolamide-timolol (COSOPT) 22.3-6.8 MG/ML ophthalmic solution    Sig: Place 1 drop into both eyes 2 (two) times daily.    Dispense:  10 mL    Refill:  1  . aflibercept (EYLEA) SOLN 2 mg  . aflibercept (EYLEA) SOLN 2 mg       Return in about 5 weeks (around 01/23/2019) for f/u exu ARMD OU, DFE,  OCT.  There are no Patient Instructions on file for this visit.   Explained the diagnoses, plan, and follow up with the patient and they expressed understanding.  Patient expressed understanding of the importance of proper follow up care.   This document serves as a record of services personally performed by Gardiner Sleeper, MD, PhD. It was created on their behalf by Ernest Mallick, OA, an ophthalmic assistant. The creation of this record is the provider's dictation and/or activities during the visit.    Electronically signed by: Ernest Mallick, OA  01.14.2020 12:15 AM    Gardiner Sleeper, M.D., Ph.D. Diseases & Surgery of the Retina and Vitreous [facility]  I have reviewed the above documentation for accuracy and completeness, and I agree with the above. Gardiner Sleeper, M.D., Ph.D. 12/23/18 12:15 AM    Abbreviations: M myopia (nearsighted); A astigmatism; H hyperopia (farsighted); P presbyopia; Mrx spectacle prescription;  CTL contact lenses; OD right eye; OS left eye; OU both eyes  XT exotropia; ET esotropia; PEK punctate epithelial keratitis; PEE punctate epithelial erosions; DES dry eye syndrome; MGD meibomian gland dysfunction; ATs artificial tears; PFAT's preservative free artificial tears; [place] nuclear sclerotic cataract; PSC posterior subcapsular cataract; ERM epi-retinal membrane; PVD posterior vitreous detachment; RD retinal detachment; DM diabetes mellitus; DR diabetic retinopathy; NPDR non-proliferative diabetic retinopathy; PDR proliferative diabetic retinopathy; CSME clinically significant macular edema; DME diabetic macular edema; dbh dot blot hemorrhages; CWS cotton wool spot; POAG primary open angle glaucoma; C/D cup-to-disc ratio; HVF humphrey visual  field; GVF goldmann visual field; OCT optical coherence tomography; IOP intraocular pressure; BRVO Branch retinal vein occlusion; CRVO central retinal vein occlusion; CRAO central retinal artery occlusion; BRAO  branch retinal artery occlusion; RT retinal tear; SB scleral buckle; PPV pars plana vitrectomy; VH Vitreous hemorrhage; PRP panretinal laser photocoagulation; IVK intravitreal kenalog; VMT vitreomacular traction; MH Macular hole;  NVD neovascularization of the disc; NVE neovascularization elsewhere; AREDS age related eye disease study; ARMD age related macular degeneration; POAG primary open angle glaucoma; EBMD epithelial/anterior basement membrane dystrophy; ACIOL anterior chamber intraocular lens; IOL intraocular lens; PCIOL posterior chamber intraocular lens; Phaco/IOL phacoemulsification with intraocular lens placement; [place] photorefractive keratectomy; LASIK laser assisted in situ keratomileusis; HTN hypertension; DM diabetes mellitus; COPD chronic obstructive pulmonary disease

## 2018-12-19 ENCOUNTER — Ambulatory Visit (INDEPENDENT_AMBULATORY_CARE_PROVIDER_SITE_OTHER): Payer: Medicare HMO | Admitting: Ophthalmology

## 2018-12-19 ENCOUNTER — Encounter (INDEPENDENT_AMBULATORY_CARE_PROVIDER_SITE_OTHER): Payer: Self-pay | Admitting: Ophthalmology

## 2018-12-19 DIAGNOSIS — H353231 Exudative age-related macular degeneration, bilateral, with active choroidal neovascularization: Secondary | ICD-10-CM | POA: Diagnosis not present

## 2018-12-19 DIAGNOSIS — H40053 Ocular hypertension, bilateral: Secondary | ICD-10-CM | POA: Diagnosis not present

## 2018-12-19 DIAGNOSIS — H3581 Retinal edema: Secondary | ICD-10-CM | POA: Diagnosis not present

## 2018-12-19 DIAGNOSIS — H25813 Combined forms of age-related cataract, bilateral: Secondary | ICD-10-CM

## 2018-12-19 DIAGNOSIS — H43813 Vitreous degeneration, bilateral: Secondary | ICD-10-CM

## 2018-12-19 MED ORDER — AFLIBERCEPT 2MG/0.05ML IZ SOLN FOR KALEIDOSCOPE
2.0000 mg | INTRAVITREAL | Status: DC
  Administered 2018-12-19: 2 mg via INTRAVITREAL

## 2018-12-19 MED ORDER — DORZOLAMIDE HCL-TIMOLOL MAL 2-0.5 % OP SOLN: 1.0000 [drp] | Freq: Two times a day (BID) | OPHTHALMIC | 1 refills | Status: AC

## 2018-12-23 ENCOUNTER — Encounter (INDEPENDENT_AMBULATORY_CARE_PROVIDER_SITE_OTHER): Payer: Self-pay | Admitting: Ophthalmology

## 2019-01-22 NOTE — Progress Notes (Signed)
[facility] Note  01/24/2019     CHIEF COMPLAINT Patient presents for Retina Follow Up   HISTORY OF PRESENT ILLNESS: Gabrielle [name] is a 73 y.o. female who presents to the clinic today for:   HPI    Retina Follow Up    Patient presents with  Wet AMD.  In both eyes.  Duration of 5 weeks.  Since onset it is stable.  I, the attending physician,  performed the HPI with the patient and updated documentation appropriately.          Comments    5 weeks retina follow up for wet armd ou. Patient states no vision changes noticed. Pt is using dorozlamide bid ou.       Last edited by Bernarda Caffey, MD on 01/24/2019  8:40 AM. (History)    pt states she has noticed her vision has gotten a little better, she states she is using Cosopt twice a day  Referring physician: Eustaquio Maize, MD [facility],  [id number]  HISTORICAL INFORMATION:   Selected notes from the MEDICAL RECORD NUMBER Referred by Dr. Cristela Blue for concern of ARMD OU;  LEE- 01.03.19 Cristela Blue) [BCVA OD: 10/50 OS: 20/80] Ocular Hx- ARMD OU, DME PMH- elevated chol., HTN    CURRENT MEDICATIONS: Current Outpatient Medications (Ophthalmic Drugs)  Medication Sig  . dorzolamide-timolol (COSOPT) 22.3-6.8 MG/ML ophthalmic solution Place 1 drop into both eyes 2 (two) times daily.   Current Facility-Administered Medications (Ophthalmic Drugs)  Medication Route  . aflibercept (EYLEA) SOLN 2 mg Intravitreal  . aflibercept (EYLEA) SOLN 2 mg Intravitreal  . aflibercept (EYLEA) SOLN 2 mg Intravitreal  . aflibercept (EYLEA) SOLN 2 mg Intravitreal  . aflibercept (EYLEA) SOLN 2 mg Intravitreal  . aflibercept (EYLEA) SOLN 2 mg Intravitreal  . aflibercept (EYLEA) SOLN 2 mg Intravitreal  . aflibercept (EYLEA) SOLN 2 mg Intravitreal  . aflibercept (EYLEA) SOLN 2 mg Intravitreal  . aflibercept (EYLEA) SOLN 2 mg Intravitreal  . aflibercept (EYLEA) SOLN 2 mg Intravitreal  . aflibercept (EYLEA)  SOLN 2 mg Intravitreal  . aflibercept (EYLEA) SOLN 2 mg Intravitreal  . aflibercept (EYLEA) SOLN 2 mg Intravitreal  . aflibercept (EYLEA) SOLN 2 mg Intravitreal  . aflibercept (EYLEA) SOLN 2 mg Intravitreal  . aflibercept (EYLEA) SOLN 2 mg Intravitreal   Current Outpatient Medications (Other)  Medication Sig  . alendronate (FOSAMAX) 70 MG tablet Take 1 tablet (70 mg total) by mouth every 7 (seven) days. Take with a full glass of water on an empty stomach.  Marland Kitchen amLODipine (NORVASC) 10 MG tablet TAKE 1 TABLET DAILY  . cholecalciferol (VITAMIN D) 1000 units tablet Take 1,000 Units by mouth daily.  Marland Kitchen losartan-hydrochlorothiazide (HYZAAR) 100-25 MG tablet Take 1 tablet by mouth daily.  . pravastatin (PRAVACHOL) 40 MG tablet Take 1 tablet (40 mg total) by mouth daily.   Current Facility-Administered Medications (Other)  Medication Route  . Bevacizumab (AVASTIN) SOLN 1.25 mg Intravitreal  . Bevacizumab (AVASTIN) SOLN 1.25 mg Intravitreal      REVIEW OF SYSTEMS: ROS    Positive for: Cardiovascular, Eyes   Negative for: Constitutional, Gastrointestinal, Neurological, Skin, Genitourinary, Musculoskeletal, HENT, Endocrine, Respiratory, Psychiatric, Allergic/Imm, Heme/Lymph   Last edited by Elmore Guise on 01/24/2019  8:20 AM. (History)       ALLERGIES Allergies  Allergen Reactions  . Lisinopril     cough    PAST MEDICAL HISTORY Past Medical History:  Diagnosis Date  . H/O  cesarean section   . Hx of tonsillectomy   . Hyperlipidemia   . Hypertension    Past Surgical History:  Procedure Laterality Date  . CARPAL TUNNEL RELEASE Right   . CESAREAN SECTION    . TONSILLECTOMY      FAMILY HISTORY Family History  Problem Relation Age of Onset  . Macular degeneration Mother   . Stroke Mother   . Hypertension Mother   . Dementia Mother   . Cancer Father        lung  . Diabetes Sister   . Hypertension Sister     SOCIAL HISTORY Social History   Tobacco Use  . Smoking  status: Never Smoker  . Smokeless tobacco: Never Used  Substance Use Topics  . Alcohol use: No    Frequency: Never  . Drug use: No         OPHTHALMIC EXAM:  Base Eye Exam    Visual Acuity (Snellen - Linear)      Right Left   Dist cc 20/CF 20/100-2   Dist ph cc 20/NI 20/60-3       Tonometry (Tonopen, 8:23 AM)      Right Left   Pressure 18 15       Pupils      Dark Light Shape React APD   Right 3 2 Round Brisk None   Left 3 2 Round Brisk None       Visual Fields (Counting fingers)      Left Right    Full Full       Extraocular Movement      Right Left    Full, Ortho Full, Ortho       Neuro/Psych    Oriented x3:  Yes   Mood/Affect:  Normal       Dilation    Both eyes:  1.0% Mydriacyl, 2.5% Phenylephrine @ 8:25 AM        Slit Lamp and Fundus Exam    Slit Lamp Exam      Right Left   Lids/Lashes Dermatochalasis - upper lid Dermatochalasis - upper lid   Conjunctiva/Sclera White and quiet White and quiet   Cornea Arcus, 1-2+ Punctate epithelial erosions Arcus, 1-2+ Punctate epithelial erosions   Anterior Chamber Deep and quiet Deep and quiet   Iris Round and well dilated Round and well dilated   Lens 3+ Nuclear sclerosis, 3+ Cortical cataract, trace Posterior subcapsular cataract centrally 2-3+ Nuclear sclerosis, 2+ Cortical cataract, trace Posterior subcapsular cataract centrally   Vitreous Vitreous syneresis, Posterior vitreous detachment Vitreous syneresis, Posterior vitreous detachment       Fundus Exam      Right Left   Disc Normal Normal   C/D Ratio 0.2 0.3   Macula Blunted foveal reflex, Drusen, Pigment clumping and atrophy, central thickening/disciform scar, +PED, mild persistent cystic change, no heme Blunted foveal reflex, central thickening, RPE clumping and atrophy, Drusen, RPE rip, improved IRF/SRF overlying PED, no heme   Vessels Vascular attenuation Vascular attenuation   Periphery Attached, mild Reticular degeneration Attached, mild  Reticular degeneration        Refraction    Wearing Rx      Sphere Cylinder Axis Add   Right -3.25 +1.00 158 +2.50   Left -3.50 +0.50 180 +2.50   Type:  PAL       Manifest Refraction (Auto)      Sphere Cylinder Axis Dist VA   Right -2.00 +0.25 146 20/NI   Left -2.50 +1.00 003 20/60-3  IMAGING AND PROCEDURES  Imaging and Procedures for 03/21/18  OCT, Retina - OU - Both Eyes       Right Eye Quality was good. Central Foveal Thickness: 508. Progression has been stable. Findings include pigment epithelial detachment, epiretinal membrane, subretinal hyper-reflective material, retinal drusen , abnormal foveal contour, no SRF, intraretinal fluid (Interval improvement in cystic changes).   Left Eye Quality was good. Central Foveal Thickness: 335. Progression has improved. Findings include subretinal hyper-reflective material, pigment epithelial detachment, subretinal fluid, intraretinal fluid, retinal drusen , abnormal foveal contour (Mild interval increase in SRF, +IRF).   Notes *Images captured and stored on drive  Diagnosis / Impression:  Exudative ARMD OU OD: mild interval dec in SRHM/subretinal disciform scar; tr persistent IRF OS: interval dec in IRF/SRF, persistent PED and RPE tear -- interval improvement  Clinical management:  See below  Abbreviations: NFP - Normal foveal profile. CME - cystoid macular edema. PED - pigment epithelial detachment. IRF - intraretinal fluid. SRF - subretinal fluid. EZ - ellipsoid zone. ERM - epiretinal membrane. ORA - outer retinal atrophy. ORT - outer retinal tubulation. SRHM - subretinal hyper-reflective material        Intravitreal Injection, Pharmacologic Agent - OS - Left Eye       Time Out 01/24/2019. 9:23 AM. Confirmed correct patient, procedure, site, and patient consented.   Anesthesia Topical anesthesia was used. Anesthetic medications included Lidocaine 2%, Proparacaine 0.5%.   Procedure Preparation included  5% betadine to ocular surface, eyelid speculum. A 30 gauge needle was used.   Injection:  2 mg aflibercept Alfonse Flavors) SOLN   NDC: [id number], Lot: [id number], Expiration date: 08/04/2019   Route: Intravitreal, Site: Left Eye, Waste: 0.05 mL  Post-op Post injection exam found visual acuity of at least counting fingers. The patient tolerated the procedure well. There were no complications. The patient received written and verbal post procedure care education.                 ASSESSMENT/PLAN:    ICD-10-CM   1. Exudative age-related macular degeneration of both eyes with active choroidal neovascularization (HCC) H35.3231 Intravitreal Injection, Pharmacologic Agent - OS - Left Eye    aflibercept (EYLEA) SOLN 2 mg  2. Retinal edema H35.81 OCT, Retina - OU - Both Eyes  3. Posterior vitreous detachment of both eyes H43.813   4. Combined form of age-related cataract, both eyes H25.813   5. Ocular hypertension, bilateral H40.053     1,2. Exudative age related macular degeneration, both eyes.    - severe exudative disease with very active CNVM OU at presentation in January 2019  - S/P IVA OD #1 (01.04.19), #2 (02.15.19)  - S/P IVA OS #1 (01.18.19), #2 (02.15.19)  - switched to Eylea 3.18.19 due to severity of disease  - S/P IVE OD #1 (03.18.19), #2 (04.17.19), #3 (05.15.19) -- injections held due to stable disciform scar, #4 (10.02.19) -- now on a q3-4 month interval schedule, #5 (01.15.20)  - S/P IVE OS #1 (03.18.19), #2 (04.17.19), #3 (05.15.19), #4 (06.12.19), #5 (07.10.19), #6 (08.07.19), #7 (09.04.19), #8 (10.02.19), #9 (11.06.19), #10 (12.11.19), #11 (01.15.20)  - OCT today shows interval improvement in IRF/SRF OS, surrounding RPE tear  - OD with significant subretinal disciform scar - stable from prior  - will plan to treat OD ~q3-4 mos -- next treatment tentatively April/May 2020   - recommend IVE OS #12 today  - pt wishes to be treated with IVE OS, today (02.20.20)  - RBA of  procedure discussed, questions  answered  - informed consent obtained and signed  - see procedure note  - Eylea paperwork filled out on 02.15.19 and fully approved through [employer] -- renewed 12/2018  - f/u in 5 wks for DFE/OCT/possible injection  3. PVD / vitreous syneresis  Discussed findings and prognosis  No RT or RD on 360  exam  Reviewed s/s of RT/RD  Strict return precautions for any such RT/RD signs/symptoms  4. Combined form of age related cataract OU- - The symptoms of cataract, surgical options, and treatments and risks were discussed with patient. - discussed diagnosis and progression - approaching visual significance, but long discussion about limitations of cataract surgery due to presence of ARMD OU - pt wishes to proceed with referral to Dr. Hassie Bruce-- [phone] ext [phone] for Lynnae Prude, Surgical Coordinator  5. Ocular Hypertension OU - IOP improved today - cont Cosopt BID OU   Ophthalmic Meds Ordered this visit:  Meds ordered this encounter  Medications  . aflibercept (EYLEA) SOLN 2 mg       Return in about 5 weeks (around 02/28/2019) for f/u exu ARMD OU, DFE, OCT.  There are no Patient Instructions on file for this visit.   Explained the diagnoses, plan, and follow up with the patient and they expressed understanding.  Patient expressed understanding of the importance of proper follow up care.   This document serves as a record of services personally performed by Gardiner Sleeper, MD, PhD. It was created on their behalf by Ernest Mallick, OA, an ophthalmic assistant. The creation of this record is the provider's dictation and/or activities during the visit.    Electronically signed by: Ernest Mallick, OA  02.18.2020 12:41 PM     Gardiner Sleeper, M.D., Ph.D. Diseases & Surgery of the Retina and Vitreous [facility]  I have reviewed the above documentation for accuracy and completeness, and I agree with the above. Gardiner Sleeper,  M.D., Ph.D. 01/25/19 12:43 PM   Abbreviations: M myopia (nearsighted); A astigmatism; H hyperopia (farsighted); P presbyopia; Mrx spectacle prescription;  CTL contact lenses; OD right eye; OS left eye; OU both eyes  XT exotropia; ET esotropia; PEK punctate epithelial keratitis; PEE punctate epithelial erosions; DES dry eye syndrome; MGD meibomian gland dysfunction; ATs artificial tears; PFAT's preservative free artificial tears; [place] nuclear sclerotic cataract; PSC posterior subcapsular cataract; ERM epi-retinal membrane; PVD posterior vitreous detachment; RD retinal detachment; DM diabetes mellitus; DR diabetic retinopathy; NPDR non-proliferative diabetic retinopathy; PDR proliferative diabetic retinopathy; CSME clinically significant macular edema; DME diabetic macular edema; dbh dot blot hemorrhages; CWS cotton wool spot; POAG primary open angle glaucoma; C/D cup-to-disc ratio; HVF humphrey visual field; GVF goldmann visual field; OCT optical coherence tomography; IOP intraocular pressure; BRVO Branch retinal vein occlusion; CRVO central retinal vein occlusion; CRAO central retinal artery occlusion; BRAO branch retinal artery occlusion; RT retinal tear; SB scleral buckle; PPV pars plana vitrectomy; VH Vitreous hemorrhage; PRP panretinal laser photocoagulation; IVK intravitreal kenalog; VMT vitreomacular traction; MH Macular hole;  NVD neovascularization of the disc; NVE neovascularization elsewhere; AREDS age related eye disease study; ARMD age related macular degeneration; POAG primary open angle glaucoma; EBMD epithelial/anterior basement membrane dystrophy; ACIOL anterior chamber intraocular lens; IOL intraocular lens; PCIOL posterior chamber intraocular lens; Phaco/IOL phacoemulsification with intraocular lens placement; [place] photorefractive keratectomy; LASIK laser assisted in situ keratomileusis; HTN hypertension; DM diabetes mellitus; COPD chronic obstructive pulmonary disease

## 2019-01-24 ENCOUNTER — Encounter (INDEPENDENT_AMBULATORY_CARE_PROVIDER_SITE_OTHER): Payer: Self-pay | Admitting: Ophthalmology

## 2019-01-24 ENCOUNTER — Ambulatory Visit (INDEPENDENT_AMBULATORY_CARE_PROVIDER_SITE_OTHER): Payer: Medicare HMO | Admitting: Ophthalmology

## 2019-01-24 DIAGNOSIS — H43813 Vitreous degeneration, bilateral: Secondary | ICD-10-CM | POA: Diagnosis not present

## 2019-01-24 DIAGNOSIS — H353231 Exudative age-related macular degeneration, bilateral, with active choroidal neovascularization: Secondary | ICD-10-CM | POA: Diagnosis not present

## 2019-01-24 DIAGNOSIS — H25813 Combined forms of age-related cataract, bilateral: Secondary | ICD-10-CM

## 2019-01-24 DIAGNOSIS — H40053 Ocular hypertension, bilateral: Secondary | ICD-10-CM | POA: Diagnosis not present

## 2019-01-24 DIAGNOSIS — H3581 Retinal edema: Secondary | ICD-10-CM | POA: Diagnosis not present

## 2019-01-24 MED ORDER — AFLIBERCEPT 2MG/0.05ML IZ SOLN FOR KALEIDOSCOPE
2.0000 mg | INTRAVITREAL | Status: DC
  Administered 2019-01-24: 2 mg via INTRAVITREAL

## 2019-01-25 ENCOUNTER — Encounter (INDEPENDENT_AMBULATORY_CARE_PROVIDER_SITE_OTHER): Payer: Self-pay | Admitting: Ophthalmology

## 2019-02-26 NOTE — Progress Notes (Signed)
[facility] Note  02/28/2019     CHIEF COMPLAINT Patient presents for Retina Follow Up   HISTORY OF PRESENT ILLNESS: Gabrielle [name] is a 73 y.o. female who presents to the clinic today for:   HPI    Retina Follow Up    Patient presents with  Wet AMD.  In both eyes.  This started months ago.  Severity is moderate.  Duration of 5 weeks.  Since onset it is gradually improving.  I, the attending physician,  performed the HPI with the patient and updated documentation appropriately.          Comments    73 y/o female pt here for 5 wk f/u for exu ARMD w/active CNV OU.  VA OD seems improved.  No change in [place] OS.  Denies pain, flashes, floaters.  Cosopt BID OU.       Last edited by Bernarda Caffey, MD on 02/28/2019  9:09 AM. (History)    pt states she has noticed her vision has gotten a little better, she states she is using Cosopt twice a day  Referring physician: Eustaquio Maize, MD [place], [place] [address]  HISTORICAL INFORMATION:   Selected notes from the MEDICAL RECORD NUMBER Referred by Dr. Cristela Blue for concern of ARMD OU;  LEE- 01.03.19 Cristela Blue) [BCVA OD: 10/50 OS: 20/80] Ocular Hx- ARMD OU, DME PMH- elevated chol., HTN    CURRENT MEDICATIONS: Current Outpatient Medications (Ophthalmic Drugs)  Medication Sig  . dorzolamide-timolol (COSOPT) 22.3-6.8 MG/ML ophthalmic solution Place 1 drop into both eyes 2 (two) times daily.   Current Facility-Administered Medications (Ophthalmic Drugs)  Medication Route  . aflibercept (EYLEA) SOLN 2 mg Intravitreal  . aflibercept (EYLEA) SOLN 2 mg Intravitreal  . aflibercept (EYLEA) SOLN 2 mg Intravitreal  . aflibercept (EYLEA) SOLN 2 mg Intravitreal  . aflibercept (EYLEA) SOLN 2 mg Intravitreal  . aflibercept (EYLEA) SOLN 2 mg Intravitreal  . aflibercept (EYLEA) SOLN 2 mg Intravitreal  . aflibercept (EYLEA) SOLN 2 mg Intravitreal  . aflibercept (EYLEA) SOLN 2 mg Intravitreal  . aflibercept  (EYLEA) SOLN 2 mg Intravitreal  . aflibercept (EYLEA) SOLN 2 mg Intravitreal  . aflibercept (EYLEA) SOLN 2 mg Intravitreal  . aflibercept (EYLEA) SOLN 2 mg Intravitreal  . aflibercept (EYLEA) SOLN 2 mg Intravitreal  . aflibercept (EYLEA) SOLN 2 mg Intravitreal  . aflibercept (EYLEA) SOLN 2 mg Intravitreal  . aflibercept (EYLEA) SOLN 2 mg Intravitreal   Current Outpatient Medications (Other)  Medication Sig  . alendronate (FOSAMAX) 70 MG tablet Take 1 tablet (70 mg total) by mouth every 7 (seven) days. Take with a full glass of water on an empty stomach.  Marland Kitchen amLODipine (NORVASC) 10 MG tablet TAKE 1 TABLET DAILY  . cholecalciferol (VITAMIN D) 1000 units tablet Take 1,000 Units by mouth daily.  Marland Kitchen losartan-hydrochlorothiazide (HYZAAR) 100-25 MG tablet Take 1 tablet by mouth daily.  . pravastatin (PRAVACHOL) 40 MG tablet Take 1 tablet (40 mg total) by mouth daily.   Current Facility-Administered Medications (Other)  Medication Route  . Bevacizumab (AVASTIN) SOLN 1.25 mg Intravitreal  . Bevacizumab (AVASTIN) SOLN 1.25 mg Intravitreal      REVIEW OF SYSTEMS: ROS    Positive for: Eyes   Negative for: Constitutional, Gastrointestinal, Neurological, Skin, Genitourinary, Musculoskeletal, HENT, Endocrine, Cardiovascular, Respiratory, Psychiatric, Allergic/Imm, Heme/Lymph   Last edited by Matthew Folks, COA on 02/28/2019  8:12 AM. (History)       ALLERGIES Allergies  Allergen Reactions  . Lisinopril     cough    PAST MEDICAL HISTORY Past Medical History:  Diagnosis Date  . Cataract    OU  . H/O cesarean section   . Hx of tonsillectomy   . Hyperlipidemia   . Hypertension   . Macular degeneration    Exu ARMD OU   Past Surgical History:  Procedure Laterality Date  . CARPAL TUNNEL RELEASE Right   . CESAREAN SECTION    . TONSILLECTOMY      FAMILY HISTORY Family History  Problem Relation Age of Onset  . Macular degeneration Mother   . Stroke Mother   . Hypertension  Mother   . Dementia Mother   . Cancer Father        lung  . Diabetes Sister   . Hypertension Sister     SOCIAL HISTORY Social History   Tobacco Use  . Smoking status: Never Smoker  . Smokeless tobacco: Never Used  Substance Use Topics  . Alcohol use: No    Frequency: Never  . Drug use: No         OPHTHALMIC EXAM:  Base Eye Exam    Visual Acuity (Snellen - Linear)      Right Left   Dist cc 20/100 -2 20/100 -2   Dist ph cc NI NI   Correction:  Glasses       Tonometry (Tonopen, 8:14 AM)      Right Left   Pressure 15 16       Pupils      Dark Light Shape React APD   Right 3 2 Round Brisk None   Left 3 2 Round Brisk None       Visual Fields (Counting fingers)      Left Right    Full Full       Extraocular Movement      Right Left    Full, Ortho Full, Ortho       Neuro/Psych    Oriented x3:  Yes   Mood/Affect:  Normal       Dilation    Both eyes:  1.0% Mydriacyl, 2.5% Phenylephrine @ 8:14 AM        Slit Lamp and Fundus Exam    Slit Lamp Exam      Right Left   Lids/Lashes Dermatochalasis - upper lid Dermatochalasis - upper lid   Conjunctiva/Sclera White and quiet White and quiet   Cornea Arcus, 1-2+ Punctate epithelial erosions Arcus, 1-2+ Punctate epithelial erosions   Anterior Chamber Deep and quiet Deep and quiet   Iris Round and well dilated Round and well dilated   Lens 3+ Nuclear sclerosis, 3+ Cortical cataract, trace Posterior subcapsular cataract centrally 2-3+ Nuclear sclerosis, 2+ Cortical cataract, trace Posterior subcapsular cataract centrally   Vitreous Vitreous syneresis, Posterior vitreous detachment Vitreous syneresis, Posterior vitreous detachment       Fundus Exam      Right Left   Disc Normal Normal   C/D Ratio 0.2 0.3   Macula Blunted foveal reflex, Drusen, Pigment clumping and atrophy, central thickening/disciform scar, +PED, no heme Blunted foveal reflex, central thickening, RPE clumping and atrophy, Drusen, RPE rip,  stably improved IRF/SRF overlying PED, no heme   Vessels Vascular attenuation Vascular attenuation   Periphery Attached, mild Reticular degeneration Attached, mild Reticular degeneration          IMAGING AND PROCEDURES  Imaging and Procedures for 03/21/18  OCT, Retina - OU - Both Eyes  Right Eye Quality was good. Central Foveal Thickness: 547. Progression has been stable. Findings include pigment epithelial detachment, epiretinal membrane, subretinal hyper-reflective material, retinal drusen , abnormal foveal contour, no SRF, intraretinal fluid (Trace, persistent cystic changes).   Left Eye Quality was good. Central Foveal Thickness: 343. Progression has improved. Findings include subretinal hyper-reflective material, pigment epithelial detachment, subretinal fluid, intraretinal fluid, retinal drusen , abnormal foveal contour (Mild persistent SRF/IRF).   Notes *Images captured and stored on drive  Diagnosis / Impression:  Exudative ARMD OU OD: mild interval dec in SRHM/subretinal disciform scar; tr persistent IRF OS: interval dec in IRF/SRF, persistent PED and RPE tear -- persistent IRF/SRF  Clinical management:  See below  Abbreviations: NFP - Normal foveal profile. CME - cystoid macular edema. PED - pigment epithelial detachment. IRF - intraretinal fluid. SRF - subretinal fluid. EZ - ellipsoid zone. ERM - epiretinal membrane. ORA - outer retinal atrophy. ORT - outer retinal tubulation. SRHM - subretinal hyper-reflective material        Intravitreal Injection, Pharmacologic Agent - OS - Left Eye       Time Out 02/28/2019. 9:44 AM. Confirmed correct patient, procedure, site, and patient consented.   Anesthesia Topical anesthesia was used. Anesthetic medications included Lidocaine 2%, Proparacaine 0.5%.   Procedure Preparation included 5% betadine to ocular surface, eyelid speculum. A 30 gauge needle was used.   Injection:  2 mg aflibercept Alfonse Flavors) SOLN   NDC:  [id number], Lot: [id number], Expiration date: 08/04/2019   Route: Intravitreal, Site: Left Eye, Waste: 0.05 mL  Post-op Post injection exam found visual acuity of at least counting fingers. The patient tolerated the procedure well. There were no complications. The patient received written and verbal post procedure care education.                 ASSESSMENT/PLAN:    ICD-10-CM   1. Exudative age-related macular degeneration of both eyes with active choroidal neovascularization (HCC) H35.3231 Intravitreal Injection, Pharmacologic Agent - OS - Left Eye  2. Retinal edema H35.81 OCT, Retina - OU - Both Eyes  3. Posterior vitreous detachment of both eyes H43.813   4. Combined form of age-related cataract, both eyes H25.813   5. Ocular hypertension, bilateral H40.053     1,2. Exudative age related macular degeneration, both eyes.    - severe exudative disease with very active CNVM OU at presentation in January 2019  - S/P IVA OD #1 (01.04.19), #2 (02.15.19)  - S/P IVA OS #1 (01.18.19), #2 (02.15.19)  - switched to Eylea 3.18.19 due to severity of disease  - S/P IVE OD #1 (03.18.19), #2 (04.17.19), #3 (05.15.19) -- injections held due to stable disciform scar, #4 (10.02.19) -- now on a q3-4 month interval schedule, #5 (01.15.20)  - S/P IVE OS #1 (03.18.19), #2 (04.17.19), #3 (05.15.19), #4 (06.12.19), #5 (07.10.19), #6 (08.07.19), #7 (09.04.19), #8 (10.02.19), #9 (11.06.19), #10 (12.11.19), #11 (01.15.20), #12 (02.20.20)  - OCT today shows stable improvement in IRF/SRF OS, surrounding RPE tear  - OD with significant subretinal disciform scar - stable from prior  - will plan to treat OD ~q3-4 mos -- next treatment tentatively April/May 2020   - recommend IVE OS #13 today  - pt wishes to be treated with IVE OS, today (03.26.20)  - RBA of procedure discussed, questions answered  - informed consent obtained and signed  - see procedure note  - Eylea paperwork filled out on 02.15.19 and  fully approved through [employer] -- renewed 12/2018  - f/u  in 5 wks for DFE/OCT/possible injection  3. PVD / vitreous syneresis  Discussed findings and prognosis  No RT or RD on 360  exam  Reviewed s/s of RT/RD  Strict return precautions for any such RT/RD signs/symptoms  4. Combined form of age related cataract OU- - The symptoms of cataract, surgical options, and treatments and risks were discussed with patient. - discussed diagnosis and progression - approaching visual significance, but long discussion about limitations of cataract surgery due to presence of ARMD OU - pt wishes to proceed with referral to Dr. Hassie Bruce-- [phone] ext [phone] for Lynnae Prude, Surgical Coordinator -- awaiting scheduling call back at this time  5. Ocular Hypertension OU - IOP improved today - cont Cosopt BID OU   Ophthalmic Meds Ordered this visit:  No orders of the defined types were placed in this encounter.      Return in about 5 weeks (around 04/04/2019) for f/u exu ARMD OU, DFE, OCT.  There are no Patient Instructions on file for this visit.   Explained the diagnoses, plan, and follow up with the patient and they expressed understanding.  Patient expressed understanding of the importance of proper follow up care.   This document serves as a record of services personally performed by Gardiner Sleeper, MD, PhD. It was created on their behalf by Ernest Mallick, OA, an ophthalmic assistant. The creation of this record is the provider's dictation and/or activities during the visit.    Electronically signed by: Ernest Mallick, OA  03.24.2020 10:46 AM    Gardiner Sleeper, M.D., Ph.D. Diseases & Surgery of the Retina and Vitreous [facility]  I have reviewed the above documentation for accuracy and completeness, and I agree with the above. Gardiner Sleeper, M.D., Ph.D. 02/28/19 11:01 AM    Abbreviations: M myopia (nearsighted); A astigmatism; H hyperopia (farsighted); P  presbyopia; Mrx spectacle prescription;  CTL contact lenses; OD right eye; OS left eye; OU both eyes  XT exotropia; ET esotropia; PEK punctate epithelial keratitis; PEE punctate epithelial erosions; DES dry eye syndrome; MGD meibomian gland dysfunction; ATs artificial tears; PFAT's preservative free artificial tears; [place] nuclear sclerotic cataract; PSC posterior subcapsular cataract; ERM epi-retinal membrane; PVD posterior vitreous detachment; RD retinal detachment; DM diabetes mellitus; DR diabetic retinopathy; NPDR non-proliferative diabetic retinopathy; PDR proliferative diabetic retinopathy; CSME clinically significant macular edema; DME diabetic macular edema; dbh dot blot hemorrhages; CWS cotton wool spot; POAG primary open angle glaucoma; C/D cup-to-disc ratio; HVF humphrey visual field; GVF goldmann visual field; OCT optical coherence tomography; IOP intraocular pressure; BRVO Branch retinal vein occlusion; CRVO central retinal vein occlusion; CRAO central retinal artery occlusion; BRAO branch retinal artery occlusion; RT retinal tear; SB scleral buckle; PPV pars plana vitrectomy; VH Vitreous hemorrhage; PRP panretinal laser photocoagulation; IVK intravitreal kenalog; VMT vitreomacular traction; MH Macular hole;  NVD neovascularization of the disc; NVE neovascularization elsewhere; AREDS age related eye disease study; ARMD age related macular degeneration; POAG primary open angle glaucoma; EBMD epithelial/anterior basement membrane dystrophy; ACIOL anterior chamber intraocular lens; IOL intraocular lens; PCIOL posterior chamber intraocular lens; Phaco/IOL phacoemulsification with intraocular lens placement; [place] photorefractive keratectomy; LASIK laser assisted in situ keratomileusis; HTN hypertension; DM diabetes mellitus; COPD chronic obstructive pulmonary disease

## 2019-02-28 ENCOUNTER — Encounter (INDEPENDENT_AMBULATORY_CARE_PROVIDER_SITE_OTHER): Payer: Self-pay | Admitting: Ophthalmology

## 2019-02-28 ENCOUNTER — Ambulatory Visit (INDEPENDENT_AMBULATORY_CARE_PROVIDER_SITE_OTHER): Payer: Medicare HMO | Admitting: Ophthalmology

## 2019-02-28 ENCOUNTER — Other Ambulatory Visit: Payer: Self-pay

## 2019-02-28 DIAGNOSIS — H3581 Retinal edema: Secondary | ICD-10-CM | POA: Diagnosis not present

## 2019-02-28 DIAGNOSIS — H43813 Vitreous degeneration, bilateral: Secondary | ICD-10-CM | POA: Diagnosis not present

## 2019-02-28 DIAGNOSIS — H25813 Combined forms of age-related cataract, bilateral: Secondary | ICD-10-CM | POA: Diagnosis not present

## 2019-02-28 DIAGNOSIS — H353231 Exudative age-related macular degeneration, bilateral, with active choroidal neovascularization: Secondary | ICD-10-CM

## 2019-02-28 DIAGNOSIS — H40053 Ocular hypertension, bilateral: Secondary | ICD-10-CM | POA: Diagnosis not present

## 2019-02-28 MED ORDER — AFLIBERCEPT 2MG/0.05ML IZ SOLN FOR KALEIDOSCOPE
2.0000 mg | INTRAVITREAL | Status: AC | PRN
  Administered 2019-02-28: 2 mg via INTRAVITREAL

## 2019-04-03 NOTE — Progress Notes (Addendum)
[facility] Note  04/04/2019     CHIEF COMPLAINT Patient presents for Retina Follow Up   HISTORY OF PRESENT ILLNESS: Gabrielle [name] is a 73 y.o. female who presents to the clinic today for:   HPI    Retina Follow Up    Patient presents with  Wet AMD.  In both eyes.  This started 5 weeks ago.  Severity is moderate.  Duration of 5 weeks.  Since onset it is gradually improving.  I, the attending physician,  performed the HPI with the patient and updated documentation appropriately.          Comments    Patient here for 5 weeks retina follow up for Exu ARMD OU. Patient states vision a little bit better. Some days better than others.       Last edited by Bernarda Caffey, MD on 04/04/2019  8:21 AM. (History)    pt states she has noticed her vision has gotten a little better, she states she is using Cosopt twice a day  Referring physician: No referring provider defined for this encounter.  HISTORICAL INFORMATION:   Selected notes from the MEDICAL RECORD NUMBER Referred by Dr. Cristela Blue for concern of ARMD OU;  LEE- 01.03.19 Cristela Blue) [BCVA OD: 10/50 OS: 20/80] Ocular Hx- ARMD OU, DME PMH- elevated chol., HTN    CURRENT MEDICATIONS: Current Outpatient Medications (Ophthalmic Drugs)  Medication Sig  . dorzolamide-timolol (COSOPT) 22.3-6.8 MG/ML ophthalmic solution Place 1 drop into both eyes 2 (two) times daily.   Current Facility-Administered Medications (Ophthalmic Drugs)  Medication Route  . aflibercept (EYLEA) SOLN 2 mg Intravitreal  . aflibercept (EYLEA) SOLN 2 mg Intravitreal  . aflibercept (EYLEA) SOLN 2 mg Intravitreal  . aflibercept (EYLEA) SOLN 2 mg Intravitreal  . aflibercept (EYLEA) SOLN 2 mg Intravitreal  . aflibercept (EYLEA) SOLN 2 mg Intravitreal  . aflibercept (EYLEA) SOLN 2 mg Intravitreal  . aflibercept (EYLEA) SOLN 2 mg Intravitreal  . aflibercept (EYLEA) SOLN 2 mg Intravitreal  . aflibercept (EYLEA) SOLN 2 mg Intravitreal   . aflibercept (EYLEA) SOLN 2 mg Intravitreal  . aflibercept (EYLEA) SOLN 2 mg Intravitreal  . aflibercept (EYLEA) SOLN 2 mg Intravitreal  . aflibercept (EYLEA) SOLN 2 mg Intravitreal  . aflibercept (EYLEA) SOLN 2 mg Intravitreal  . aflibercept (EYLEA) SOLN 2 mg Intravitreal  . aflibercept (EYLEA) SOLN 2 mg Intravitreal   Current Outpatient Medications (Other)  Medication Sig  . alendronate (FOSAMAX) 70 MG tablet Take 1 tablet (70 mg total) by mouth every 7 (seven) days. Take with a full glass of water on an empty stomach.  Marland Kitchen amLODipine (NORVASC) 10 MG tablet TAKE 1 TABLET DAILY  . cholecalciferol (VITAMIN D) 1000 units tablet Take 1,000 Units by mouth daily.  Marland Kitchen losartan-hydrochlorothiazide (HYZAAR) 100-25 MG tablet Take 1 tablet by mouth daily.  . pravastatin (PRAVACHOL) 40 MG tablet Take 1 tablet (40 mg total) by mouth daily.   Current Facility-Administered Medications (Other)  Medication Route  . Bevacizumab (AVASTIN) SOLN 1.25 mg Intravitreal  . Bevacizumab (AVASTIN) SOLN 1.25 mg Intravitreal      REVIEW OF SYSTEMS: ROS    Positive for: Cardiovascular, Eyes   Negative for: Constitutional, Gastrointestinal, Neurological, Skin, Genitourinary, Musculoskeletal, HENT, Endocrine, Respiratory, Psychiatric, Allergic/Imm, Heme/Lymph   Last edited by Theodore Demark on 04/04/2019  8:04 AM. (History)       ALLERGIES Allergies  Allergen Reactions  . Lisinopril     cough    PAST MEDICAL  HISTORY Past Medical History:  Diagnosis Date  . Cataract    OU  . H/O cesarean section   . Hx of tonsillectomy   . Hyperlipidemia   . Hypertension   . Macular degeneration    Exu ARMD OU   Past Surgical History:  Procedure Laterality Date  . CARPAL TUNNEL RELEASE Right   . CESAREAN SECTION    . TONSILLECTOMY      FAMILY HISTORY Family History  Problem Relation Age of Onset  . Macular degeneration Mother   . Stroke Mother   . Hypertension Mother   . Dementia Mother   . Cancer  Father        lung  . Diabetes Sister   . Hypertension Sister     SOCIAL HISTORY Social History   Tobacco Use  . Smoking status: Never Smoker  . Smokeless tobacco: Never Used  Substance Use Topics  . Alcohol use: No    Frequency: Never  . Drug use: No         OPHTHALMIC EXAM:  Base Eye Exam    Visual Acuity (Snellen - Linear)      Right Left   Dist cc 20/80 -1 20/80 -1   Dist ph cc NI NI   Correction:  Glasses       Tonometry (Tonopen, 8:01 AM)      Right Left   Pressure 18 13       Pupils      Dark Light Shape React APD   Right 3 2 Round Brisk None   Left 3 2 Round Brisk None       Visual Fields (Counting fingers)      Left Right    Full Full       Extraocular Movement      Right Left    Full, Ortho Full, Ortho       Neuro/Psych    Oriented x3:  Yes   Mood/Affect:  Normal       Dilation    Both eyes:  1.0% Mydriacyl, 2.5% Phenylephrine @ 8:01 AM        Slit Lamp and Fundus Exam    Slit Lamp Exam      Right Left   Lids/Lashes Dermatochalasis - upper lid Dermatochalasis - upper lid   Conjunctiva/Sclera White and quiet White and quiet   Cornea Arcus, 1-2+ Punctate epithelial erosions Arcus, 1-2+ Punctate epithelial erosions   Anterior Chamber Deep and quiet Deep and quiet   Iris Round and well dilated Round and well dilated   Lens 3+ Nuclear sclerosis, 3+ Cortical cataract, trace Posterior subcapsular cataract centrally 2-3+ Nuclear sclerosis, 2+ Cortical cataract, trace Posterior subcapsular cataract centrally   Vitreous Vitreous syneresis, Posterior vitreous detachment Vitreous syneresis, Posterior vitreous detachment       Fundus Exam      Right Left   Disc Normal Normal   C/D Ratio 0.2 0.3   Macula Blunted foveal reflex, Drusen, Pigment clumping and atrophy, central thickening/disciform scar, +PED, no heme Blunted foveal reflex, central thickening, RPE clumping and atrophy, Drusen, RPE rip, stably improved IRF/SRF overlying PED, no heme    Vessels Vascular attenuation Vascular attenuation   Periphery Attached, mild Reticular degeneration Attached, mild Reticular degeneration        Refraction    Wearing Rx      Sphere Cylinder Axis Add   Right -3.25 +1.00 158 +2.50   Left -3.50 +0.50 180 +2.50   Type:  PAL  IMAGING AND PROCEDURES  Imaging and Procedures for 03/21/18  OCT, Retina - OU - Both Eyes       Right Eye Quality was good. Central Foveal Thickness: 535. Progression has been stable. Findings include pigment epithelial detachment, epiretinal membrane, subretinal hyper-reflective material, retinal drusen , abnormal foveal contour, no SRF, intraretinal fluid (Trace, persistent cystic changes).   Left Eye Quality was good. Central Foveal Thickness: 346. Progression has been stable. Findings include subretinal hyper-reflective material, pigment epithelial detachment, subretinal fluid, intraretinal fluid, retinal drusen , abnormal foveal contour (Mild interval decrease in IRF).   Notes *Images captured and stored on drive  Diagnosis / Impression:  Exudative ARMD OU OD: mild interval dec in SRHM/subretinal disciform scar; tr persistent IRF OS: Mild interval decrease in IRF, persistent PED/SRHM and RPE tear  Clinical management:  See below  Abbreviations: NFP - Normal foveal profile. CME - cystoid macular edema. PED - pigment epithelial detachment. IRF - intraretinal fluid. SRF - subretinal fluid. EZ - ellipsoid zone. ERM - epiretinal membrane. ORA - outer retinal atrophy. ORT - outer retinal tubulation. SRHM - subretinal hyper-reflective material        Intravitreal Injection, Pharmacologic Agent - OS - Left Eye       Time Out 04/04/2019. 8:44 AM. Confirmed correct patient, procedure, site, and patient consented.   Anesthesia Topical anesthesia was used. Anesthetic medications included Lidocaine 2%, Proparacaine 0.5%.   Procedure Preparation included 5% betadine to ocular surface, eyelid  speculum. A 30 gauge needle was used.   Injection:  2 mg aflibercept Alfonse Flavors) SOLN   NDC: A3590391, Lot: [id number], Expiration date: 08/04/2019   Route: Intravitreal, Site: Left Eye, Waste: 0.05 mL  Post-op Post injection exam found visual acuity of at least counting fingers. The patient tolerated the procedure well. There were no complications. The patient received written and verbal post procedure care education.                 ASSESSMENT/PLAN:    ICD-10-CM   1. Exudative age-related macular degeneration of both eyes with active choroidal neovascularization (HCC) H35.3231 Intravitreal Injection, Pharmacologic Agent - OS - Left Eye    aflibercept (EYLEA) SOLN 2 mg  2. Retinal edema H35.81 OCT, Retina - OU - Both Eyes  3. Posterior vitreous detachment of both eyes H43.813   4. Combined form of age-related cataract, both eyes H25.813   5. Ocular hypertension, bilateral H40.053     1,2. Exudative age related macular degeneration, both eyes.    - severe exudative disease with very active CNVM OU at presentation in January 2019  - S/P IVA OD #1 (01.04.19), #2 (02.15.19)  - S/P IVA OS #1 (01.18.19), #2 (02.15.19)  - switched to Eylea 3.18.19 due to severity of disease  - S/P IVE OD #1 (03.18.19), #2 (04.17.19), #3 (05.15.19) -- injections held due to stable disciform scar, #4 (10.02.19) -- now on a q3-4 month interval schedule, #5 (01.15.20)  - S/P IVE OS #1 (03.18.19), #2 (04.17.19), #3 (05.15.19), #4 (06.12.19), #5 (07.10.19), #6 (08.07.19), #7 (09.04.19), #8 (10.02.19), #9 (11.06.19), #10 (12.11.19), #11 (01.15.20), #12 (02.20.20), #13 (03.26.20)  - OCT today shows stable improvement in IRF/SRF OS, surrounding RPE tear  - OD with significant subretinal disciform scar - stable from prior  - will plan to treat OD ~q3-5 mos -- today is ~3.5 mos -- will treat OD next visit (5 wks) due to stability   - recommend IVE OS #14 today, 04.30. 20  - pt wishes to be treated  with IVE  -  RBA of procedure discussed, questions answered  - informed consent obtained and signed  - see procedure note  - Eylea paperwork filled out on 02.15.19 and fully approved through [employer] -- renewed 12/2018  - f/u in 5 wks for DFE/OCT/possible injection OU  - send letter regarding pts visual ability for hearing/disability documentation  3. PVD / vitreous syneresis  Discussed findings and prognosis  No RT or RD on 360  exam  Reviewed s/s of RT/RD  Strict return precautions for any such RT/RD signs/symptoms  4. Combined form of age related cataract OU- - The symptoms of cataract, surgical options, and treatments and risks were discussed with patient. - discussed diagnosis and progression - approaching visual significance, but long discussion about limitations of cataract surgery due to presence of ARMD OU - pt wishes to proceed with referral to Dr. Hassie Bruce-- [phone] ext [phone] for Lynnae Prude, Surgical Coordinator -- awaiting scheduling call back at this time  5. Ocular Hypertension OU - IOP okay today -- 18 OD, 13 OS - cont Cosopt BID OU   Ophthalmic Meds Ordered this visit:  Meds ordered this encounter  Medications  . aflibercept (EYLEA) SOLN 2 mg       Return in about 5 weeks (around 05/09/2019) for f/u exu ARMD OU, DFE, OCT.  There are no Patient Instructions on file for this visit.   Explained the diagnoses, plan, and follow up with the patient and they expressed understanding.  Patient expressed understanding of the importance of proper follow up care.   This document serves as a record of services personally performed by Gardiner Sleeper, MD, PhD. It was created on their behalf by Ernest Mallick, OA, an ophthalmic assistant. The creation of this record is the provider's dictation and/or activities during the visit.    Electronically signed by: Ernest Mallick, OA  04.29.2020 12:07 PM    Gardiner Sleeper, M.D., Ph.D. Diseases & Surgery of the Retina and Vitreous [facility]  [place]  I have reviewed the above documentation for accuracy and completeness, and I agree with the above. Gardiner Sleeper, M.D., Ph.D. 04/05/19 12:11 AM     Abbreviations: M myopia (nearsighted); A astigmatism; H hyperopia (farsighted); P presbyopia; Mrx spectacle prescription;  CTL contact lenses; OD right eye; OS left eye; OU both eyes  XT exotropia; ET esotropia; PEK punctate epithelial keratitis; PEE punctate epithelial erosions; DES dry eye syndrome; MGD meibomian gland dysfunction; ATs artificial tears; PFAT's preservative free artificial tears; [place] nuclear sclerotic cataract; PSC posterior subcapsular cataract; ERM epi-retinal membrane; PVD posterior vitreous detachment; RD retinal detachment; DM diabetes mellitus; DR diabetic retinopathy; NPDR non-proliferative diabetic retinopathy; PDR proliferative diabetic retinopathy; CSME clinically significant macular edema; DME diabetic macular edema; dbh dot blot hemorrhages; CWS cotton wool spot; POAG primary open angle glaucoma; C/D cup-to-disc ratio; HVF humphrey visual field; GVF goldmann visual field; OCT optical coherence tomography; IOP intraocular pressure; BRVO Branch retinal vein occlusion; CRVO central retinal vein occlusion; CRAO central retinal artery occlusion; BRAO branch retinal artery occlusion; RT retinal tear; SB scleral buckle; PPV pars plana vitrectomy; VH Vitreous hemorrhage; PRP panretinal laser photocoagulation; IVK intravitreal kenalog; VMT vitreomacular traction; MH Macular hole;  NVD neovascularization of the disc; NVE neovascularization elsewhere; AREDS age related eye disease study; ARMD age related macular degeneration; POAG primary open angle glaucoma; EBMD epithelial/anterior basement membrane dystrophy; ACIOL anterior chamber intraocular lens; IOL intraocular lens; PCIOL posterior chamber intraocular lens; Phaco/IOL phacoemulsification with intraocular lens placement;  [place] photorefractive keratectomy;  LASIK laser assisted in situ keratomileusis; HTN hypertension; DM diabetes mellitus; COPD chronic obstructive pulmonary disease

## 2019-04-04 ENCOUNTER — Encounter (INDEPENDENT_AMBULATORY_CARE_PROVIDER_SITE_OTHER): Payer: Self-pay | Admitting: Ophthalmology

## 2019-04-04 ENCOUNTER — Other Ambulatory Visit: Payer: Self-pay

## 2019-04-04 ENCOUNTER — Ambulatory Visit (INDEPENDENT_AMBULATORY_CARE_PROVIDER_SITE_OTHER): Payer: Medicare HMO | Admitting: Ophthalmology

## 2019-04-04 DIAGNOSIS — H353231 Exudative age-related macular degeneration, bilateral, with active choroidal neovascularization: Secondary | ICD-10-CM

## 2019-04-04 DIAGNOSIS — H40053 Ocular hypertension, bilateral: Secondary | ICD-10-CM | POA: Diagnosis not present

## 2019-04-04 DIAGNOSIS — H3581 Retinal edema: Secondary | ICD-10-CM | POA: Diagnosis not present

## 2019-04-04 DIAGNOSIS — H25813 Combined forms of age-related cataract, bilateral: Secondary | ICD-10-CM | POA: Diagnosis not present

## 2019-04-04 DIAGNOSIS — H43813 Vitreous degeneration, bilateral: Secondary | ICD-10-CM

## 2019-04-04 MED ORDER — AFLIBERCEPT 2MG/0.05ML IZ SOLN FOR KALEIDOSCOPE
2.0000 mg | INTRAVITREAL | Status: AC | PRN
  Administered 2019-04-04: 2 mg via INTRAVITREAL

## 2019-04-09 ENCOUNTER — Telehealth: Payer: Self-pay

## 2019-04-09 DIAGNOSIS — I10 Essential (primary) hypertension: Secondary | ICD-10-CM

## 2019-04-09 MED ORDER — LOSARTAN POTASSIUM-HCTZ 100-25 MG PO TABS: ORAL_TABLET | ORAL | 0 refills | Status: DC

## 2019-04-09 NOTE — Telephone Encounter (Signed)
erroneous

## 2019-05-07 NOTE — Progress Notes (Signed)
[facility] Note  05/09/2019     CHIEF COMPLAINT Patient presents for Retina Evaluation   HISTORY OF PRESENT ILLNESS: Gabrielle [name] is a 73 y.o. female who presents to the clinic today for:   HPI    Retina Evaluation    In both eyes.  This started years ago.  Duration of years.  Context:  distance vision and near vision.  I, the attending physician,  performed the HPI with the patient and updated documentation appropriately.          Comments    Patient states her vision fluctuates from good to bad in both eyes.  Patient denies eye pain or discomfort and denies any new or worsening floaters or fol OU.       Last edited by Bernarda Caffey, MD on 05/09/2019  9:20 AM. (History)    pt states her vision is about the same as last time, pt states she has not heard from Dr. Marisa Hua about cataract sx   Referring physician: No referring provider defined for this encounter.  HISTORICAL INFORMATION:   Selected notes from the MEDICAL RECORD NUMBER Referred by Dr. Cristela Blue for concern of ARMD OU;  LEE- 01.03.19 Cristela Blue) [[facility] OD: 10/50 OS: 20/80] Ocular Hx- ARMD OU, DME PMH- elevated chol., HTN    CURRENT MEDICATIONS: Current Outpatient Medications (Ophthalmic Drugs)  Medication Sig  . dorzolamide-timolol (COSOPT) 22.3-6.8 MG/ML ophthalmic solution Place 1 drop into both eyes 2 (two) times daily.   Current Facility-Administered Medications (Ophthalmic Drugs)  Medication Route  . aflibercept (EYLEA) SOLN 2 mg Intravitreal  . aflibercept (EYLEA) SOLN 2 mg Intravitreal  . aflibercept (EYLEA) SOLN 2 mg Intravitreal  . aflibercept (EYLEA) SOLN 2 mg Intravitreal  . aflibercept (EYLEA) SOLN 2 mg Intravitreal  . aflibercept (EYLEA) SOLN 2 mg Intravitreal  . aflibercept (EYLEA) SOLN 2 mg Intravitreal  . aflibercept (EYLEA) SOLN 2 mg Intravitreal  . aflibercept (EYLEA) SOLN 2 mg Intravitreal  . aflibercept (EYLEA) SOLN 2 mg Intravitreal  . aflibercept  (EYLEA) SOLN 2 mg Intravitreal  . aflibercept (EYLEA) SOLN 2 mg Intravitreal  . aflibercept (EYLEA) SOLN 2 mg Intravitreal  . aflibercept (EYLEA) SOLN 2 mg Intravitreal  . aflibercept (EYLEA) SOLN 2 mg Intravitreal  . aflibercept (EYLEA) SOLN 2 mg Intravitreal  . aflibercept (EYLEA) SOLN 2 mg Intravitreal   Current Outpatient Medications (Other)  Medication Sig  . alendronate (FOSAMAX) 70 MG tablet Take 1 tablet (70 mg total) by mouth every 7 (seven) days. Take with a full glass of water on an empty stomach.  Marland Kitchen amLODipine (NORVASC) 10 MG tablet TAKE 1 TABLET DAILY  . cholecalciferol (VITAMIN D) 1000 units tablet Take 1,000 Units by mouth daily.  Marland Kitchen losartan-hydrochlorothiazide (HYZAAR) 100-25 MG tablet Take one (1) by mouth daily.  Needs to be seen for further refills.  . pravastatin (PRAVACHOL) 40 MG tablet Take 1 tablet (40 mg total) by mouth daily.   Current Facility-Administered Medications (Other)  Medication Route  . Bevacizumab (AVASTIN) SOLN 1.25 mg Intravitreal  . Bevacizumab (AVASTIN) SOLN 1.25 mg Intravitreal      REVIEW OF SYSTEMS: ROS    Positive for: Cardiovascular, Eyes   Negative for: Constitutional, Gastrointestinal, Neurological, Skin, Genitourinary, Musculoskeletal, HENT, Endocrine, Respiratory, Psychiatric, Allergic/Imm, Heme/Lymph   Last edited by Doneen Poisson on 05/09/2019  8:32 AM. (History)       ALLERGIES Allergies  Allergen Reactions  . Lisinopril     cough  PAST MEDICAL HISTORY Past Medical History:  Diagnosis Date  . Cataract    OU  . H/O cesarean section   . Hx of tonsillectomy   . Hyperlipidemia   . Hypertension   . Macular degeneration    Exu ARMD OU   Past Surgical History:  Procedure Laterality Date  . CARPAL TUNNEL RELEASE Right   . CESAREAN SECTION    . TONSILLECTOMY      FAMILY HISTORY Family History  Problem Relation Age of Onset  . Macular degeneration Mother   . Stroke Mother   . Hypertension Mother   .  Dementia Mother   . Cancer Father        lung  . Diabetes Sister   . Hypertension Sister     SOCIAL HISTORY Social History   Tobacco Use  . Smoking status: Never Smoker  . Smokeless tobacco: Never Used  Substance Use Topics  . Alcohol use: No    Frequency: Never  . Drug use: No         OPHTHALMIC EXAM:  Base Eye Exam    Visual Acuity (Snellen - Linear)      Right Left   Dist cc 20/150 -2 20/150 -1   Dist ph cc NI 20/100 -1       Tonometry (Tonopen, 8:38 AM)      Right Left   Pressure 19 16       Pupils      Dark Light Shape React APD   Right 4 3 Round Brisk 0   Left 4 3 Round Brisk 0       Extraocular Movement      Right Left    Full Full       Neuro/Psych    Oriented x3:  Yes   Mood/Affect:  Normal       Dilation    Both eyes:  1.0% Mydriacyl, 2.5% Phenylephrine @ 8:39 AM        Slit Lamp and Fundus Exam    Slit Lamp Exam      Right Left   Lids/Lashes Dermatochalasis - upper lid Dermatochalasis - upper lid   Conjunctiva/Sclera White and quiet White and quiet   Cornea Arcus, 1-2+ Punctate epithelial erosions Arcus, 1-2+ Punctate epithelial erosions   Anterior Chamber Deep and quiet Deep and quiet   Iris Round and well dilated Round and well dilated   Lens 3+ Nuclear sclerosis, 3+ Cortical cataract, trace Posterior subcapsular cataract centrally 2-3+ Nuclear sclerosis, 2+ Cortical cataract, trace Posterior subcapsular cataract centrally   Vitreous Vitreous syneresis, Posterior vitreous detachment Vitreous syneresis, Posterior vitreous detachment       Fundus Exam      Right Left   Disc Normal Normal   C/D Ratio 0.2 0.3   Macula Blunted foveal reflex, Drusen, Pigment clumping and atrophy, central thickening/disciform scar, +PED, no heme Blunted foveal reflex, central thickening, RPE clumping and atrophy, Drusen, RPE rip, stably improved IRF/SRF overlying PED, no heme   Vessels Vascular attenuation Vascular attenuation   Periphery Attached, mild  Reticular degeneration Attached, mild Reticular degeneration        Refraction    Wearing Rx      Sphere Cylinder Axis Add   Right -3.25 +1.00 158 +2.50   Left -3.50 +0.50 180 +2.50   Type:  PAL       Manifest Refraction      Sphere Cylinder Axis Dist VA   Right -3.25 +0.25 150 20/150-1   Left -2.50 +1.00  180 20/100-2          IMAGING AND PROCEDURES  Imaging and Procedures for 03/21/18  OCT, Retina - OU - Both Eyes       Right Eye Quality was good. Central Foveal Thickness: 544. Progression has been stable. Findings include pigment epithelial detachment, epiretinal membrane, subretinal hyper-reflective material, retinal drusen , abnormal foveal contour, no SRF, intraretinal fluid, disciform scar, outer retinal tubulation (Trace, persistent cystic changes).   Left Eye Quality was good. Central Foveal Thickness: 363. Progression has improved. Findings include subretinal hyper-reflective material, pigment epithelial detachment, intraretinal fluid, retinal drusen , abnormal foveal contour, no SRF, outer retinal atrophy (Mild interval improvement in IRF/SRF, stable PED).   Notes *Images captured and stored on drive  Diagnosis / Impression:  Exudative ARMD OU OD: mild interval dec in SRHM/subretinal disciform scar; tr persistent IRF OS: Mild interval improvement in IRF/SRF, stable PED  Clinical management:  See below  Abbreviations: NFP - Normal foveal profile. CME - cystoid macular edema. PED - pigment epithelial detachment. IRF - intraretinal fluid. SRF - subretinal fluid. EZ - ellipsoid zone. ERM - epiretinal membrane. ORA - outer retinal atrophy. ORT - outer retinal tubulation. SRHM - subretinal hyper-reflective material        Intravitreal Injection, Pharmacologic Agent - OD - Right Eye       Time Out 05/09/2019. 9:29 AM. Confirmed correct patient, procedure, site, and patient consented.   Anesthesia Topical anesthesia was used. Anesthetic medications included  Lidocaine 2%, Proparacaine 0.5%.   Procedure Preparation included 5% betadine to ocular surface, eyelid speculum. A 30 gauge needle was used.   Injection:  2 mg aflibercept Alfonse Flavors) SOLN   NDC: A3590391, Lot: [id number], Expiration date: 09/04/2019   Route: Intravitreal, Site: Right Eye, Waste: 0.05 mL  Post-op Post injection exam found visual acuity of at least counting fingers. The patient tolerated the procedure well. There were no complications. The patient received written and verbal post procedure care education.        Intravitreal Injection, Pharmacologic Agent - OS - Left Eye       Time Out 05/09/2019. 9:28 AM. Confirmed correct patient, procedure, site, and patient consented.   Anesthesia Topical anesthesia was used. Anesthetic medications included Tetracaine 0.5%, Lidocaine 2%.   Procedure Preparation included 5% betadine to ocular surface. A 30 gauge needle was used.   Injection:  2 mg aflibercept Alfonse Flavors) SOLN   NDC: A3590391, Lot: [id number], Expiration date: 10/04/2019   Route: Intravitreal, Site: Left Eye, Waste: 0.05 mL  Post-op Post injection exam found visual acuity of at least counting fingers. The patient tolerated the procedure well. There were no complications. The patient received written and verbal post procedure care education.                 ASSESSMENT/PLAN:    ICD-10-CM   1. Exudative age-related macular degeneration of both eyes with active choroidal neovascularization (HCC) H35.3231 Intravitreal Injection, Pharmacologic Agent - OD - Right Eye    Intravitreal Injection, Pharmacologic Agent - OS - Left Eye    aflibercept (EYLEA) SOLN 2 mg    aflibercept (EYLEA) SOLN 2 mg  2. Retinal edema H35.81 OCT, Retina - OU - Both Eyes  3. Posterior vitreous detachment of both eyes H43.813   4. Combined form of age-related cataract, both eyes H25.813   5. Ocular hypertension, bilateral H40.053     1,2. Exudative age related macular  degeneration, both eyes.    - severe exudative disease with very active CNVM  OU at presentation in January 2019  - S/P IVA OD #1 (01.04.19), #2 (02.15.19)  - S/P IVA OS #1 (01.18.19), #2 (02.15.19)  - switched to Eylea 3.18.19 due to severity of disease  - S/P IVE OD #1 (03.18.19), #2 (04.17.19), #3 (05.15.19) -- injections held due to stable disciform scar, #4 (10.02.19) -- now on a q3-4 month interval schedule, #5 (01.15.20)  - S/P IVE OS #1 (03.18.19), #2 (04.17.19), #3 (05.15.19), #4 (06.12.19), #5 (07.10.19), #6 (08.07.19), #7 (09.04.19), #8 (10.02.19), #9 (11.06.19), #10 (12.11.19), #11 (01.15.20), #12 (02.20.20), #13 (03.26.20), #14 (04.30.20)  - OCT today shows mild improvement in IRF/SRF OS, surrounding RPE tear  - OD with significant subretinal disciform scar - stable from prior  - will plan to treat OD ~q3-5 mos for maintenance/prevention -- last injection 01.15.20  - recommend IVE OU, OD #6, OS #15 today, 06.04. 20  - pt wishes to be treated with IVE  - RBA of procedure discussed, questions answered  - informed consent obtained and signed  - see procedure note  - Eylea paperwork filled out on 02.15.19 and fully approved through [employer] -- renewed 12/2018  - f/u in 5 wks for DFE/OCT/possible injection OU  - letter sent regarding pts visual ability for hearing/disability documentation per pt request  3. PVD / vitreous syneresis  - Discussed findings and prognosis  - No RT or RD on 360  exam  - Reviewed s/s of RT/RD  - Strict return precautions for any such RT/RD signs/symptoms  4. Combined form of age related cataract OU-  - The symptoms of cataract, surgical options, and treatments and risks were discussed with patient.  - discussed diagnosis and progression  - approaching visual significance, but long discussion about limitations of cataract surgery due to presence of ARMD OU  - pt wishes to proceed with referral to Dr. Hassie Bruce-- [phone] ext [phone] for Lynnae Prude,  Surgical Coordinator -- awaiting scheduling call back at this time  5. Ocular Hypertension OU  - IOP okay today -- 19 OD, 16 OS  - cont Cosopt BID OU   Ophthalmic Meds Ordered this visit:  Meds ordered this encounter  Medications  . aflibercept (EYLEA) SOLN 2 mg  . aflibercept (EYLEA) SOLN 2 mg       Return in about 6 weeks (around 06/20/2019) for f/u exu ARMD OU, DFE, OCT.  There are no Patient Instructions on file for this visit.   Explained the diagnoses, plan, and follow up with the patient and they expressed understanding.  Patient expressed understanding of the importance of proper follow up care.   This document serves as a record of services personally performed by Gardiner Sleeper, MD, PhD. It was created on their behalf by Ernest Mallick, OA, an ophthalmic assistant. The creation of this record is the provider's dictation and/or activities during the visit.    Electronically signed by: Ernest Mallick, OA  06.02.2020 11:16 AM    Gardiner Sleeper, M.D., Ph.D. Diseases & Surgery of the Retina and Vitreous [facility]  I have reviewed the above documentation for accuracy and completeness, and I agree with the above. Gardiner Sleeper, M.D., Ph.D. 05/09/19 11:16 AM    Abbreviations: M myopia (nearsighted); A astigmatism; H hyperopia (farsighted); P presbyopia; Mrx spectacle prescription;  CTL contact lenses; OD right eye; OS left eye; OU both eyes  XT exotropia; ET esotropia; PEK punctate epithelial keratitis; PEE punctate epithelial erosions; DES dry eye syndrome; MGD meibomian gland  dysfunction; ATs artificial tears; PFAT's preservative free artificial tears; NSC nuclear sclerotic cataract; PSC posterior subcapsular cataract; ERM epi-retinal membrane; PVD posterior vitreous detachment; RD retinal detachment; DM diabetes mellitus; DR diabetic retinopathy; NPDR non-proliferative diabetic retinopathy; PDR proliferative diabetic retinopathy; CSME clinically  significant macular edema; DME diabetic macular edema; dbh dot blot hemorrhages; CWS cotton wool spot; POAG primary open angle glaucoma; C/D cup-to-disc ratio; HVF humphrey visual field; GVF goldmann visual field; OCT optical coherence tomography; IOP intraocular pressure; BRVO Branch retinal vein occlusion; CRVO central retinal vein occlusion; CRAO central retinal artery occlusion; BRAO branch retinal artery occlusion; RT retinal tear; SB scleral buckle; PPV pars plana vitrectomy; VH Vitreous hemorrhage; PRP panretinal laser photocoagulation; IVK intravitreal kenalog; VMT vitreomacular traction; MH Macular hole;  NVD neovascularization of the disc; NVE neovascularization elsewhere; AREDS age related eye disease study; ARMD age related macular degeneration; POAG primary open angle glaucoma; EBMD epithelial/anterior basement membrane dystrophy; ACIOL anterior chamber intraocular lens; IOL intraocular lens; PCIOL posterior chamber intraocular lens; Phaco/IOL phacoemulsification with intraocular lens placement; PRK photorefractive keratectomy; LASIK laser assisted in situ keratomileusis; HTN hypertension; DM diabetes mellitus; COPD chronic obstructive pulmonary disease 

## 2019-05-09 ENCOUNTER — Encounter (INDEPENDENT_AMBULATORY_CARE_PROVIDER_SITE_OTHER): Payer: Self-pay | Admitting: Ophthalmology

## 2019-05-09 ENCOUNTER — Ambulatory Visit (INDEPENDENT_AMBULATORY_CARE_PROVIDER_SITE_OTHER): Payer: Medicare HMO | Admitting: Ophthalmology

## 2019-05-09 ENCOUNTER — Other Ambulatory Visit: Payer: Self-pay

## 2019-05-09 DIAGNOSIS — H25813 Combined forms of age-related cataract, bilateral: Secondary | ICD-10-CM

## 2019-05-09 DIAGNOSIS — H3581 Retinal edema: Secondary | ICD-10-CM | POA: Diagnosis not present

## 2019-05-09 DIAGNOSIS — H353231 Exudative age-related macular degeneration, bilateral, with active choroidal neovascularization: Secondary | ICD-10-CM | POA: Diagnosis not present

## 2019-05-09 DIAGNOSIS — H40053 Ocular hypertension, bilateral: Secondary | ICD-10-CM | POA: Diagnosis not present

## 2019-05-09 DIAGNOSIS — H43813 Vitreous degeneration, bilateral: Secondary | ICD-10-CM | POA: Diagnosis not present

## 2019-05-09 MED ORDER — AFLIBERCEPT 2MG/0.05ML IZ SOLN FOR KALEIDOSCOPE
2.0000 mg | INTRAVITREAL | Status: AC | PRN
  Administered 2019-05-09: 2 mg via INTRAVITREAL

## 2019-05-14 ENCOUNTER — Other Ambulatory Visit: Payer: Self-pay | Admitting: Family Medicine

## 2019-05-14 DIAGNOSIS — I10 Essential (primary) hypertension: Secondary | ICD-10-CM

## 2019-05-14 NOTE — Telephone Encounter (Signed)
Dettinger. NTBS 30 days given 04/09/19

## 2019-05-15 ENCOUNTER — Other Ambulatory Visit: Payer: Self-pay

## 2019-05-15 ENCOUNTER — Ambulatory Visit (INDEPENDENT_AMBULATORY_CARE_PROVIDER_SITE_OTHER): Payer: Medicare HMO | Admitting: Family Medicine

## 2019-05-15 ENCOUNTER — Encounter: Payer: Self-pay | Admitting: Family Medicine

## 2019-05-15 DIAGNOSIS — E782 Mixed hyperlipidemia: Secondary | ICD-10-CM | POA: Insufficient documentation

## 2019-05-15 DIAGNOSIS — I10 Essential (primary) hypertension: Secondary | ICD-10-CM | POA: Insufficient documentation

## 2019-05-15 DIAGNOSIS — E7849 Other hyperlipidemia: Secondary | ICD-10-CM | POA: Diagnosis not present

## 2019-05-15 MED ORDER — LOSARTAN POTASSIUM-HCTZ 100-25 MG PO TABS: ORAL_TABLET | ORAL | 1 refills | Status: DC

## 2019-05-15 NOTE — Telephone Encounter (Signed)
Please call to schedule an appointment for refills.

## 2019-05-15 NOTE — Progress Notes (Signed)
Telephone visit  Subjective: CC: f/u HTN PCP: Janora Norlander, DO WNI:[id number] K Gabrielle [name] is a 73 y.o. female calls for telephone consult today. Patient provides verbal consent for consult held via phone.  Location of patient: sister's house Location of provider: [facility] Others present for call: sister, Mariann Laster  1. Hypertension with hyperlipidemia Patient reports compliance with losartan 100-25 mg daily.  She no longer takes the pravastatin.  Uncertain as to whether or not this was discontinued or she just ran out of medicine.  She has problems with her vision but [facility].  Does not complain of any chest pain, shortness of breath or lower extremity edema.  She is due for lipid panel and metabolic panel.  ROS: Per HPI  Allergies  Allergen Reactions  . Lisinopril     cough   Past Medical History:  Diagnosis Date  . Cataract    OU  . H/O cesarean section   . Hx of tonsillectomy   . Hyperlipidemia   . Hypertension   . Macular degeneration    Exu ARMD OU    Current Outpatient Medications:  .  alendronate (FOSAMAX) 70 MG tablet, Take 1 tablet (70 mg total) by mouth every 7 (seven) days. Take with a full glass of water on an empty stomach., Disp: 4 tablet, Rfl: 11 .  cholecalciferol (VITAMIN D) 1000 units tablet, Take 1,000 Units by mouth daily., Disp: , Rfl:  .  dorzolamide-timolol (COSOPT) 22.3-6.8 MG/ML ophthalmic solution, Place 1 drop into both eyes 2 (two) times daily., Disp: 10 mL, Rfl: 1 .  losartan-hydrochlorothiazide (HYZAAR) 100-25 MG tablet, Take one (1) by mouth daily.  Needs to be seen for further refills., Disp: 30 tablet, Rfl: 0  Current Facility-Administered Medications:  .  aflibercept (EYLEA) SOLN 2 mg, 2 mg, Intravitreal, , Bernarda Caffey, MD, 2 mg at 02/19/18 0093 .  aflibercept (EYLEA) SOLN 2 mg, 2 mg, Intravitreal, , Bernarda Caffey, MD, 2 mg at 02/19/18 0919 .  aflibercept (EYLEA) SOLN 2 mg, 2 mg, Intravitreal, ,  Bernarda Caffey, MD, 2 mg at 03/21/18 1513 .  aflibercept (EYLEA) SOLN 2 mg, 2 mg, Intravitreal, , Bernarda Caffey, MD, 2 mg at 03/21/18 1513 .  aflibercept (EYLEA) SOLN 2 mg, 2 mg, Intravitreal, , Bernarda Caffey, MD, 2 mg at 04/18/18 8182 .  aflibercept (EYLEA) SOLN 2 mg, 2 mg, Intravitreal, , Bernarda Caffey, MD, 2 mg at 04/18/18 9937 .  aflibercept (EYLEA) SOLN 2 mg, 2 mg, Intravitreal, , Bernarda Caffey, MD, 2 mg at 05/16/18 1300 .  aflibercept (EYLEA) SOLN 2 mg, 2 mg, Intravitreal, , Bernarda Caffey, MD, 2 mg at 06/13/18 0917 .  aflibercept (EYLEA) SOLN 2 mg, 2 mg, Intravitreal, , Bernarda Caffey, MD, 2 mg at 07/11/18 0843 .  aflibercept (EYLEA) SOLN 2 mg, 2 mg, Intravitreal, , Bernarda Caffey, MD, 2 mg at 08/08/18 0841 .  aflibercept (EYLEA) SOLN 2 mg, 2 mg, Intravitreal, , Bernarda Caffey, MD, 2 mg at 09/05/18 0859 .  aflibercept (EYLEA) SOLN 2 mg, 2 mg, Intravitreal, , Bernarda Caffey, MD, 2 mg at 09/05/18 0900 .  aflibercept (EYLEA) SOLN 2 mg, 2 mg, Intravitreal, , Bernarda Caffey, MD, 2 mg at 10/10/18 1236 .  aflibercept (EYLEA) SOLN 2 mg, 2 mg, Intravitreal, , Bernarda Caffey, MD, 2 mg at 11/14/18 2339 .  aflibercept (EYLEA) SOLN 2 mg, 2 mg, Intravitreal, , Bernarda Caffey, MD, 2 mg at 12/19/18 1207 .  aflibercept (EYLEA) SOLN 2 mg, 2  mg, Intravitreal, , Bernarda Caffey, MD, 2 mg at 12/19/18 1209 .  aflibercept (EYLEA) SOLN 2 mg, 2 mg, Intravitreal, , Bernarda Caffey, MD, 2 mg at 01/24/19 1053 .  Bevacizumab (AVASTIN) SOLN 1.25 mg, 1.25 mg, Intravitreal, , Bernarda Caffey, MD, 1.25 mg at 01/19/18 1116 .  Bevacizumab (AVASTIN) SOLN 1.25 mg, 1.25 mg, Intravitreal, , Bernarda Caffey, MD, 1.25 mg at 01/19/18 1116  Assessment/ Plan: 73 y.o. female   1. Essential hypertension Plan for CMP at next visit. - losartan-hydrochlorothiazide (HYZAAR) 100-25 MG tablet; Take one (1) by mouth daily.  Dispense: 90 tablet; Refill: 1  2. Other hyperlipidemia Plan for fasting lipid panel at next visit.  Patient is aware of date and  time.  May need to reinitiate pravastatin 40 mg daily if ASCVD risk score is elevated.   Start time: 2:51p End time: 2:56pm  Total time spent on patient care (including telephone call/ virtual visit): 10 minutes  [place], [place] [phone]

## 2019-06-18 DIAGNOSIS — Z9189 Other specified personal risk factors, not elsewhere classified: Secondary | ICD-10-CM | POA: Insufficient documentation

## 2019-06-18 DIAGNOSIS — N1831 Chronic kidney disease, stage 3a: Secondary | ICD-10-CM | POA: Insufficient documentation

## 2019-06-18 DIAGNOSIS — N183 Chronic kidney disease, stage 3 unspecified: Secondary | ICD-10-CM | POA: Insufficient documentation

## 2019-06-18 DIAGNOSIS — M8580 Other specified disorders of bone density and structure, unspecified site: Secondary | ICD-10-CM | POA: Insufficient documentation

## 2019-06-18 NOTE — Progress Notes (Signed)
 Subjective: CC: HTN, cholesterol PCP: Gottschalk, Ashly M, DO HPI:Gabrielle [name] is a 72 y.o. female presenting to clinic today for:  1. Hypertension w/ HLD Patient reports she does not monitor BP at home. Meds: Compliant with Losartan/ HCTZ 100-25mg  ROS: Denies dizziness, nausea, vomiting, chest pain, LE swelling, abdominal pain or shortness of breath.  She has problems with her vision.  She has macular degeneration and gets shots in her eyes for this.  She sometimes has a headache as a result  2. Osteopenia Patient had a DEXA scan in 2019 which showed a T score of -2.4 but a risk for major osteoporotic fracture in the hip of greater than 3%.  She was placed on Fosamax by her previous PCP.  She reports compliance with the Fosamax and also takes calcium and vitamin D.  She drinks "a lot of milk" she remains physically active and states that she walks around her neighborhood at least twice a day and does home exercises with home equipment.  3.  Right shoulder pain Patient reports chronic right shoulder pain.  She has known arthritis in the right shoulder.  She denies any preceding injury, upper extremity weakness.  Pain is exacerbated by certain activities like cleaning or overhead movements.  And nurse, who is her neighbor, recently gave her some Voltaren gel to the right shoulder which she notes helped quite a bit and she would like to get a prescription for this today.  ROS: Per HPI  Allergies  Allergen Reactions  . Lisinopril     cough   Past Medical History:  Diagnosis Date  . Cataract    OU  . H/O cesarean section   . Hx of tonsillectomy   . Hyperlipidemia   . Hypertension   . Macular degeneration    Exu ARMD OU    Current Outpatient Medications:  .  alendronate (FOSAMAX) 70 MG tablet, Take 1 tablet (70 mg total) by mouth every 7 (seven) days. Take with a full glass of water on an empty stomach., Disp: 4 tablet, Rfl: 11 .  cholecalciferol (VITAMIN D) 1000 units tablet,  Take 1,000 Units by mouth daily., Disp: , Rfl:  .  dorzolamide-timolol (COSOPT) 22.3-6.8 MG/ML ophthalmic solution, Place 1 drop into both eyes 2 (two) times daily., Disp: 10 mL, Rfl: 1 .  losartan-hydrochlorothiazide (HYZAAR) 100-25 MG tablet, Take one (1) by mouth daily., Disp: 90 tablet, Rfl: 1  Current Facility-Administered Medications:  .  aflibercept (EYLEA) SOLN 2 mg, 2 mg, Intravitreal, , Zamora, Brian, MD, 2 mg at 02/19/18 0918 .  aflibercept (EYLEA) SOLN 2 mg, 2 mg, Intravitreal, , Zamora, Brian, MD, 2 mg at 02/19/18 0919 .  aflibercept (EYLEA) SOLN 2 mg, 2 mg, Intravitreal, , Zamora, Brian, MD, 2 mg at 03/21/18 1513 .  aflibercept (EYLEA) SOLN 2 mg, 2 mg, Intravitreal, , Zamora, Brian, MD, 2 mg at 03/21/18 1513 .  aflibercept (EYLEA) SOLN 2 mg, 2 mg, Intravitreal, , Zamora, Brian, MD, 2 mg at 04/18/18 0921 .  aflibercept (EYLEA) SOLN 2 mg, 2 mg, Intravitreal, , Zamora, Brian, MD, 2 mg at 04/18/18 0922 .  aflibercept (EYLEA) SOLN 2 mg, 2 mg, Intravitreal, , Zamora, Brian, MD, 2 mg at 05/16/18 1300 .  aflibercept (EYLEA) SOLN 2 mg, 2 mg, Intravitreal, , Zamora, Brian, MD, 2 mg at 06/13/18 0917 .  aflibercept (EYLEA) SOLN 2 mg, 2 mg, Intravitreal, , Zamora, Brian, MD, 2 mg at 07/11/18 0843 .  aflibercept (EYLEA) SOLN 2 mg, 2 mg,   Intravitreal, , Zamora, Brian, MD, 2 mg at 08/08/18 0841 .  aflibercept (EYLEA) SOLN 2 mg, 2 mg, Intravitreal, , Zamora, Brian, MD, 2 mg at 09/05/18 0859 .  aflibercept (EYLEA) SOLN 2 mg, 2 mg, Intravitreal, , Zamora, Brian, MD, 2 mg at 09/05/18 0900 .  aflibercept (EYLEA) SOLN 2 mg, 2 mg, Intravitreal, , Zamora, Brian, MD, 2 mg at 10/10/18 1236 .  aflibercept (EYLEA) SOLN 2 mg, 2 mg, Intravitreal, , Zamora, Brian, MD, 2 mg at 11/14/18 2339 .  aflibercept (EYLEA) SOLN 2 mg, 2 mg, Intravitreal, , Zamora, Brian, MD, 2 mg at 12/19/18 1207 .  aflibercept (EYLEA) SOLN 2 mg, 2 mg, Intravitreal, , Zamora, Brian, MD, 2 mg at 12/19/18 1209 .  aflibercept (EYLEA) SOLN 2  mg, 2 mg, Intravitreal, , Zamora, Brian, MD, 2 mg at 01/24/19 1053 .  Bevacizumab (AVASTIN) SOLN 1.25 mg, 1.25 mg, Intravitreal, , Zamora, Brian, MD, 1.25 mg at 01/19/18 1116 .  Bevacizumab (AVASTIN) SOLN 1.25 mg, 1.25 mg, Intravitreal, , Zamora, Brian, MD, 1.25 mg at 01/19/18 1116 Social History   Socioeconomic History  . Marital status: Divorced    Spouse name: Not on file  . Number of children: Not on file  . Years of education: Not on file  . Highest education level: Not on file  Occupational History  . Not on file  Social Needs  . Financial resource strain: Not on file  . Food insecurity    Worry: Not on file    Inability: Not on file  . Transportation needs    Medical: Not on file    Non-medical: Not on file  Tobacco Use  . Smoking status: Never Smoker  . Smokeless tobacco: Never Used  Substance and Sexual Activity  . Alcohol use: No    Frequency: Never  . Drug use: No  . Sexual activity: Not Currently  Lifestyle  . Physical activity    Days per week: Not on file    Minutes per session: Not on file  . Stress: Not on file  Relationships  . Social connections    Talks on phone: Not on file    Gets together: Not on file    Attends religious service: Not on file    Active member of club or organization: Not on file    Attends meetings of clubs or organizations: Not on file    Relationship status: Not on file  . Intimate partner violence    Fear of current or ex partner: Not on file    Emotionally abused: Not on file    Physically abused: Not on file    Forced sexual activity: Not on file  Other Topics Concern  . Not on file  Social History Narrative  . Not on file   Family History  Problem Relation Age of Onset  . Macular degeneration Mother   . Stroke Mother   . Hypertension Mother   . Dementia Mother   . Cancer Father        lung  . Diabetes Sister   . Hypertension Sister     Objective: Office vital signs reviewed. BP (!) 148/68 Comment: manual   Pulse 69   Temp 98.9 F (37.2 C) (Oral)   Ht 5' 3" (1.6 m)   Wt 158 lb (71.7 kg)   BMI 27.99 kg/m   Physical Examination:  General: Awake, alert, well nourished, No acute distress HEENT: Normal, sclera white, MMM Cardio: regular rate and rhythm, S1S2 heard, no murmurs appreciated   Pulm: clear to auscultation bilaterally, no wheezes, rhonchi or rales; normal work of breathing on room air Extremities: warm, well perfused, No edema, cyanosis or clubbing; +2 pulses bilaterally MSK: normal gait and station; has full active range of motion of right upper extremity.  No visible or palpable bony abnormalities  Assessment/ Plan: 72 y.o. female   1. Essential hypertension Not well controlled.  I have asked that she return in 2 weeks to have a nurse visit for blood pressure check.  If persistently above 140/90 (tighter control needed due to kidney disease), would consider addition of Norvasc 5 mg daily. - CMP14+EGFR  2. Stage 3 chronic kidney disease (HCC) Check CMP, CBC and vitamin D - CMP14+EGFR - CBC - VITAMIN D 25 Hydroxy (Vit-D Deficiency, Fractures)  3. Other hyperlipidemia Check fasting lipid panel - CMP14+EGFR - Lipid Panel  4. Osteopenia after menopause Continue Fosamax but no recent vitamin D or calcium level therefore we will check - CMP14+EGFR - VITAMIN D 25 Hydroxy (Vit-D Deficiency, Fractures)  5. High risk for hip fracture - CMP14+EGFR - VITAMIN D 25 Hydroxy (Vit-D Deficiency, Fractures)  6. Need for prophylactic vaccination against Streptococcus pneumoniae (pneumococcus) Pneumococcal vaccination administered today  7. Shoulder arthritis Voltaren gel prescribed.  Follow-up PRN - diclofenac sodium (VOLTAREN) 1 % GEL; Apply 4 g topically 4 (four) times daily.  Dispense: 400 g; Refill: 2   No orders of the defined types were placed in this encounter.  No orders of the defined types were placed in this encounter.    Ashly M Gottschalk, DO [facility] [phone]    

## 2019-06-19 NOTE — Progress Notes (Signed)
[facility] Note  06/20/2019     CHIEF COMPLAINT Patient presents for Retina Follow Up   HISTORY OF PRESENT ILLNESS: Gabrielle [name] is a 73 y.o. female who presents to the clinic today for:   HPI    Retina Follow Up    Patient presents with  Wet AMD.  In both eyes.  This started weeks ago.  Severity is moderate.  I, the attending physician,  performed the HPI with the patient and updated documentation appropriately.          Comments    Patient states her vision is about the same.  She denies eye pain or discomfort and denies any new or worsening floaters or fol.       Last edited by Bernarda Caffey, MD on 06/20/2019  8:31 AM. (History)    pt states her vision is about the same as last time, still has not heard from cataract surgeon regarding cataract surgery.   Referring physician: No referring provider defined for this encounter.  HISTORICAL INFORMATION:   Selected notes from the MEDICAL RECORD NUMBER Referred by Dr. Cristela Blue for concern of ARMD OU;  LEE- 01.03.19 Cristela Blue) [[facility] OD: 10/50 OS: 20/80] Ocular Hx- ARMD OU, DME PMH- elevated chol., HTN    CURRENT MEDICATIONS: Current Outpatient Medications (Ophthalmic Drugs)  Medication Sig  . dorzolamide-timolol (COSOPT) 22.3-6.8 MG/ML ophthalmic solution Place 1 drop into both eyes 2 (two) times daily.   Current Facility-Administered Medications (Ophthalmic Drugs)  Medication Route  . aflibercept (EYLEA) SOLN 2 mg Intravitreal  . aflibercept (EYLEA) SOLN 2 mg Intravitreal  . aflibercept (EYLEA) SOLN 2 mg Intravitreal  . aflibercept (EYLEA) SOLN 2 mg Intravitreal  . aflibercept (EYLEA) SOLN 2 mg Intravitreal  . aflibercept (EYLEA) SOLN 2 mg Intravitreal  . aflibercept (EYLEA) SOLN 2 mg Intravitreal  . aflibercept (EYLEA) SOLN 2 mg Intravitreal  . aflibercept (EYLEA) SOLN 2 mg Intravitreal  . aflibercept (EYLEA) SOLN 2 mg Intravitreal  . aflibercept (EYLEA) SOLN 2 mg Intravitreal  .  aflibercept (EYLEA) SOLN 2 mg Intravitreal  . aflibercept (EYLEA) SOLN 2 mg Intravitreal  . aflibercept (EYLEA) SOLN 2 mg Intravitreal  . aflibercept (EYLEA) SOLN 2 mg Intravitreal  . aflibercept (EYLEA) SOLN 2 mg Intravitreal  . aflibercept (EYLEA) SOLN 2 mg Intravitreal   Current Outpatient Medications (Other)  Medication Sig  . alendronate (FOSAMAX) 70 MG tablet Take 1 tablet (70 mg total) by mouth every 7 (seven) days. Take with a full glass of water on an empty stomach.  . cholecalciferol (VITAMIN D) 1000 units tablet Take 1,000 Units by mouth daily.  Marland Kitchen losartan-hydrochlorothiazide (HYZAAR) 100-25 MG tablet Take one (1) by mouth daily.   Current Facility-Administered Medications (Other)  Medication Route  . Bevacizumab (AVASTIN) SOLN 1.25 mg Intravitreal  . Bevacizumab (AVASTIN) SOLN 1.25 mg Intravitreal      REVIEW OF SYSTEMS: ROS    Positive for: Cardiovascular, Eyes   Negative for: Constitutional, Gastrointestinal, Neurological, Skin, Genitourinary, Musculoskeletal, HENT, Endocrine, Respiratory, Psychiatric, Allergic/Imm, Heme/Lymph   Last edited by Doneen Poisson on 06/20/2019  8:17 AM. (History)       ALLERGIES Allergies  Allergen Reactions  . Lisinopril     cough    PAST MEDICAL HISTORY Past Medical History:  Diagnosis Date  . Cataract    OU  . H/O cesarean section   . Hx of tonsillectomy   . Hyperlipidemia   . Hypertension   . Macular degeneration  Exu ARMD OU   Past Surgical History:  Procedure Laterality Date  . CARPAL TUNNEL RELEASE Right   . CESAREAN SECTION    . TONSILLECTOMY      FAMILY HISTORY Family History  Problem Relation Age of Onset  . Macular degeneration Mother   . Stroke Mother   . Hypertension Mother   . Dementia Mother   . Cancer Father        lung  . Diabetes Sister   . Hypertension Sister     SOCIAL HISTORY Social History   Tobacco Use  . Smoking status: Never Smoker  . Smokeless tobacco: Never Used   Substance Use Topics  . Alcohol use: No    Frequency: Never  . Drug use: No         OPHTHALMIC EXAM:  Base Eye Exam    Visual Acuity (Snellen - Linear)      Right Left   Dist cc 20/150 -2 20/100 -2   Dist ph cc NI NI   Correction: Glasses       Tonometry (Tonopen, 8:16 AM)      Right Left   Pressure 12 13       Pupils      Dark Light Shape React APD   Right 4 3 Round Brisk 0   Left 4 3 Round Brisk 0       Extraocular Movement      Right Left    Full Full       Neuro/Psych    Oriented x3: Yes   Mood/Affect: Normal       Dilation    Both eyes: 1.0% Mydriacyl, 2.5% Phenylephrine @ 8:17 AM        Slit Lamp and Fundus Exam    Slit Lamp Exam      Right Left   Lids/Lashes Dermatochalasis - upper lid Dermatochalasis - upper lid   Conjunctiva/Sclera White and quiet White and quiet   Cornea Arcus, 1-2+ Punctate epithelial erosions Arcus, 1-2+ Punctate epithelial erosions   Anterior Chamber Deep and quiet Deep and quiet   Iris Round and well dilated Round and well dilated   Lens 3+ Nuclear sclerosis, 3+ Cortical cataract, trace Posterior subcapsular cataract centrally 2-3+ Nuclear sclerosis, 2+ Cortical cataract, trace Posterior subcapsular cataract centrally   Vitreous Vitreous syneresis, Posterior vitreous detachment Vitreous syneresis, Posterior vitreous detachment       Fundus Exam      Right Left   Disc pink and sharp pink and sharp   C/D Ratio 0.2 0.3   Macula Blunted foveal reflex, Drusen, Pigment clumping and atrophy, central thickening/disciform scar, +PED, no heme Blunted foveal reflex, central thickening, RPE clumping and atrophy, Drusen, RPE rip, stably improved IRF/SRF overlying PED, no heme   Vessels Vascular attenuation Vascular attenuation   Periphery Attached, mild Reticular degeneration Attached, mild Reticular degeneration        Refraction    Wearing Rx      Sphere Cylinder Axis Add   Right -3.25 +1.00 158 +2.50   Left -3.50 +0.50 180  +2.50   Type: PAL          IMAGING AND PROCEDURES  Imaging and Procedures for 03/21/18  OCT, Retina - OU - Both Eyes       Right Eye Quality was good. Central Foveal Thickness: 542. Progression has been stable. Findings include pigment epithelial detachment, epiretinal membrane, subretinal hyper-reflective material, retinal drusen , abnormal foveal contour, no SRF, intraretinal fluid, disciform scar, outer retinal tubulation (  Stable sub-retinal scar, trace interval improvement in cystic changes overlying sub-retinal scar).   Left Eye Quality was good. Central Foveal Thickness: 349. Progression has been stable. Findings include subretinal hyper-reflective material, pigment epithelial detachment, intraretinal fluid, retinal drusen , abnormal foveal contour, no SRF, outer retinal atrophy (Persistent IRF overlying PED).   Notes *Images captured and stored on drive  Diagnosis / Impression:  Exudative ARMD OU OD: Stable sub-retinal scar, trace interval improvement in cystic changes overlying sub-retinal scar OS: Persistent IRF overlying PED  Clinical management:  See below  Abbreviations: NFP - Normal foveal profile. CME - cystoid macular edema. PED - pigment epithelial detachment. IRF - intraretinal fluid. SRF - subretinal fluid. EZ - ellipsoid zone. ERM - epiretinal membrane. ORA - outer retinal atrophy. ORT - outer retinal tubulation. SRHM - subretinal hyper-reflective material        Intravitreal Injection, Pharmacologic Agent - OS - Left Eye       Time Out 06/20/2019. 8:59 AM. Confirmed correct patient, procedure, site, and patient consented.   Anesthesia Topical anesthesia was used. Anesthetic medications included Lidocaine 2%, Proparacaine 0.5%.   Procedure Preparation included 5% betadine to ocular surface, eyelid speculum. A 30 gauge needle was used.   Injection:  2 mg aflibercept Alfonse Flavors) SOLN   NDC: A3590391, Lot: [id number], Expiration date: 01/25/2020    Route: Intravitreal, Site: Left Eye, Waste: 0.05 mL  Post-op Post injection exam found visual acuity of at least counting fingers. The patient tolerated the procedure well. There were no complications. The patient received written and verbal post procedure care education.                 ASSESSMENT/PLAN:    ICD-10-CM   1. Exudative age-related macular degeneration of both eyes with active choroidal neovascularization (HCC)  H35.3231 Intravitreal Injection, Pharmacologic Agent - OS - Left Eye    aflibercept (EYLEA) SOLN 2 mg    CANCELED: Intravitreal Injection, Pharmacologic Agent - OD - Right Eye  2. Retinal edema  H35.81 OCT, Retina - OU - Both Eyes  3. Posterior vitreous detachment of both eyes  H43.813   4. Combined form of age-related cataract, both eyes  H25.813   5. Ocular hypertension, bilateral  H40.053     1,2. Exudative age related macular degeneration, both eyes.    - severe exudative disease with very active CNVM OU at presentation in January 2019  - S/P IVA OD #1 (01.04.19), #2 (02.15.19)  - S/P IVA OS #1 (01.18.19), #2 (02.15.19)  - switched to Eylea 3.18.19 due to severity of disease  - S/P IVE OD #1 (03.18.19), #2 (04.17.19), #3 (05.15.19) -- injections held due to stable disciform scar, #4 (10.02.19) -- now on a q3-4 month interval schedule, #5 (01.15.20), #6 (06.04.20)  - S/P IVE OS #1 (03.18.19), #2 (04.17.19), #3 (05.15.19), #4 (06.12.19), #5 (07.10.19), #6 (08.07.19), #7 (09.04.19), #8 (10.02.19), #9 (11.06.19), #10 (12.11.19), #11 (01.15.20), #12 (02.20.20), #13 (03.26.20), #14 (04.30.20), #15 (06.04.20)  - OCT  OS today shows persistent IRF overlying PED  - OD with significant subretinal disciform scar - trace improvement in cystic changes  - will plan to treat OD ~q3-5 mos for maintenance/prevention -- last injection 06.04.20  - recommend IVE OS #16 today, 07.16. 20  - pt wishes to be treated with IVE  - RBA of procedure discussed, questions answered  -  informed consent obtained and signed  - see procedure note  - Eylea paperwork filled out on 02.15.19 and fully approved through [employer] --  renewed 12/2018  - f/u in 5 wks for DFE/OCT/possible injection OU  - letter sent regarding pts visual ability for hearing/disability documentation per pt request  3. PVD / vitreous syneresis  - Discussed findings and prognosis  - No RT or RD on 360  exam  - Reviewed s/s of RT/RD  - Strict return precautions for any such RT/RD signs/symptoms  4. Combined form of age related cataract OU-  - The symptoms of cataract, surgical options, and treatments and risks were discussed with patient.  - discussed diagnosis and progression  - approaching visual significance, but long discussion about limitations of cataract surgery due to presence of ARMD OU  - pt wishes to proceed with referral to Dr. Hassie Bruce-- [phone] ext [phone] for Lynnae Prude, Surgical Coordinator -- awaiting scheduling call back at this time  5. Ocular Hypertension OU  - IOP okay today -- 12 OD, 13 OS  - cont Cosopt BID OU   Ophthalmic Meds Ordered this visit:  Meds ordered this encounter  Medications  . aflibercept (EYLEA) SOLN 2 mg       Return 5 weeks, for DFE, OCT, possible injection.  There are no Patient Instructions on file for this visit.   Explained the diagnoses, plan, and follow up with the patient and they expressed understanding.  Patient expressed understanding of the importance of proper follow up care.   This document serves as a record of services personally performed by Gardiner Sleeper, MD, PhD. It was created on their behalf by Ernest Mallick, OA, an ophthalmic assistant. The creation of this record is the provider's dictation and/or activities during the visit.    Electronically signed by: Ernest Mallick, OA  07.15.2020 9:05 AM     Gardiner Sleeper, M.D., Ph.D. Diseases & Surgery of the Retina and Vitreous [facility]  I have  reviewed the above documentation for accuracy and completeness, and I agree with the above. Gardiner Sleeper, M.D., Ph.D. 06/20/19 9:05 AM     Abbreviations: M myopia (nearsighted); A astigmatism; H hyperopia (farsighted); P presbyopia; Mrx spectacle prescription;  CTL contact lenses; OD right eye; OS left eye; OU both eyes  XT exotropia; ET esotropia; PEK punctate epithelial keratitis; PEE punctate epithelial erosions; DES dry eye syndrome; MGD meibomian gland dysfunction; ATs artificial tears; PFAT's preservative free artificial tears; [place] nuclear sclerotic cataract; PSC posterior subcapsular cataract; ERM epi-retinal membrane; PVD posterior vitreous detachment; RD retinal detachment; DM diabetes mellitus; DR diabetic retinopathy; NPDR non-proliferative diabetic retinopathy; PDR proliferative diabetic retinopathy; CSME clinically significant macular edema; DME diabetic macular edema; dbh dot blot hemorrhages; CWS cotton wool spot; POAG primary open angle glaucoma; C/D cup-to-disc ratio; HVF humphrey visual field; GVF goldmann visual field; OCT optical coherence tomography; IOP intraocular pressure; BRVO Branch retinal vein occlusion; CRVO central retinal vein occlusion; CRAO central retinal artery occlusion; BRAO branch retinal artery occlusion; RT retinal tear; SB scleral buckle; PPV pars plana vitrectomy; VH Vitreous hemorrhage; PRP panretinal laser photocoagulation; IVK intravitreal kenalog; VMT vitreomacular traction; MH Macular hole;  NVD neovascularization of the disc; NVE neovascularization elsewhere; AREDS age related eye disease study; ARMD age related macular degeneration; POAG primary open angle glaucoma; EBMD epithelial/anterior basement membrane dystrophy; ACIOL anterior chamber intraocular lens; IOL intraocular lens; PCIOL posterior chamber intraocular lens; Phaco/IOL phacoemulsification with intraocular lens placement; [place] photorefractive keratectomy; LASIK laser assisted in situ  keratomileusis; HTN hypertension; DM diabetes mellitus; COPD chronic obstructive pulmonary disease

## 2019-06-20 ENCOUNTER — Ambulatory Visit (INDEPENDENT_AMBULATORY_CARE_PROVIDER_SITE_OTHER): Payer: Medicare HMO | Admitting: Ophthalmology

## 2019-06-20 ENCOUNTER — Other Ambulatory Visit: Payer: Self-pay

## 2019-06-20 ENCOUNTER — Encounter (INDEPENDENT_AMBULATORY_CARE_PROVIDER_SITE_OTHER): Payer: Self-pay | Admitting: Ophthalmology

## 2019-06-20 DIAGNOSIS — H25813 Combined forms of age-related cataract, bilateral: Secondary | ICD-10-CM | POA: Diagnosis not present

## 2019-06-20 DIAGNOSIS — H43813 Vitreous degeneration, bilateral: Secondary | ICD-10-CM | POA: Diagnosis not present

## 2019-06-20 DIAGNOSIS — H3581 Retinal edema: Secondary | ICD-10-CM | POA: Diagnosis not present

## 2019-06-20 DIAGNOSIS — H40053 Ocular hypertension, bilateral: Secondary | ICD-10-CM

## 2019-06-20 DIAGNOSIS — H353231 Exudative age-related macular degeneration, bilateral, with active choroidal neovascularization: Secondary | ICD-10-CM

## 2019-06-20 MED ORDER — AFLIBERCEPT 2MG/0.05ML IZ SOLN FOR KALEIDOSCOPE
2.0000 mg | INTRAVITREAL | Status: AC | PRN
  Administered 2019-06-20: 2 mg via INTRAVITREAL

## 2019-06-21 ENCOUNTER — Other Ambulatory Visit: Payer: Self-pay

## 2019-06-24 ENCOUNTER — Encounter: Payer: Self-pay | Admitting: Family Medicine

## 2019-06-24 ENCOUNTER — Ambulatory Visit (INDEPENDENT_AMBULATORY_CARE_PROVIDER_SITE_OTHER): Payer: Medicare HMO | Admitting: Family Medicine

## 2019-06-24 ENCOUNTER — Other Ambulatory Visit: Payer: Self-pay

## 2019-06-24 VITALS — BP 148/68 | HR 69 | Temp 98.9°F | Ht 63.0 in | Wt 158.0 lb

## 2019-06-24 DIAGNOSIS — M19019 Primary osteoarthritis, unspecified shoulder: Secondary | ICD-10-CM

## 2019-06-24 DIAGNOSIS — Z23 Encounter for immunization: Secondary | ICD-10-CM | POA: Diagnosis not present

## 2019-06-24 DIAGNOSIS — Z780 Asymptomatic menopausal state: Secondary | ICD-10-CM

## 2019-06-24 DIAGNOSIS — M810 Age-related osteoporosis without current pathological fracture: Secondary | ICD-10-CM

## 2019-06-24 DIAGNOSIS — N183 Chronic kidney disease, stage 3 unspecified: Secondary | ICD-10-CM

## 2019-06-24 DIAGNOSIS — E7849 Other hyperlipidemia: Secondary | ICD-10-CM | POA: Diagnosis not present

## 2019-06-24 DIAGNOSIS — Z9189 Other specified personal risk factors, not elsewhere classified: Secondary | ICD-10-CM

## 2019-06-24 DIAGNOSIS — I10 Essential (primary) hypertension: Secondary | ICD-10-CM | POA: Diagnosis not present

## 2019-06-24 MED ORDER — DICLOFENAC SODIUM 1 % TD GEL: 4.0000 g | Freq: Four times a day (QID) | TRANSDERMAL | 2 refills | Status: DC

## 2019-06-24 NOTE — Addendum Note (Signed)
Addended by: Carrolyn Leigh on: 06/24/2019 04:53 PM   Modules accepted: Orders

## 2019-06-24 NOTE — Patient Instructions (Addendum)
You had labs performed today.  You will be contacted with the results of the labs once they are available, usually in the next 3 business days for routine lab work.  If you have an active my chart account, they will be released to your MyChart.  If you prefer to have these labs released to you via telephone, please let us know.  If you had a pap smear or biopsy performed, expect to be contacted in about 7-10 days.  You got your pneumonia shot today.  Start checking your blood pressure a few times a week and bring this to your next visit.   How to Take Your Blood Pressure You can take your blood pressure at home with a machine. You may need to check your blood pressure at home:  To check if you have high blood pressure (hypertension).  To check your blood pressure over time.  To make sure your blood pressure medicine is working. Supplies needed: You will need a blood pressure machine, or monitor. You can buy one at a drugstore or online. When choosing one:  Choose one with an arm cuff.  Choose one that wraps around your upper arm. Only one finger should fit between your arm and the cuff.  Do not choose one that measures your blood pressure from your wrist or finger. Your doctor can suggest a monitor. How to prepare Avoid these things for 30 minutes before checking your blood pressure:  Drinking caffeine.  Drinking alcohol.  Eating.  Smoking.  Exercising. Five minutes before checking your blood pressure:  Pee.  Sit in a dining chair. Avoid sitting in a soft couch or armchair.  Be quiet. Do not talk. How to take your blood pressure Follow the instructions that came with your machine. If you have a digital blood pressure monitor, these may be the instructions: 1. Sit up straight. 2. Place your feet on the floor. Do not cross your ankles or legs. 3. Rest your left arm at the level of your heart. You may rest it on a table, desk, or chair. 4. Pull up your shirt  sleeve. 5. Wrap the blood pressure cuff around the upper part of your left arm. The cuff should be 1 inch (2.5 cm) above your elbow. It is best to wrap the cuff around bare skin. 6. Fit the cuff snugly around your arm. You should be able to place only one finger between the cuff and your arm. 7. Put the cord inside the groove of your elbow. 8. Press the power button. 9. Sit quietly while the cuff fills with air and loses air. 10. Write down the numbers on the screen. 11. Wait 2-3 minutes and then repeat steps 1-10. What do the numbers mean? Two numbers make up your blood pressure. The first number is called systolic pressure. The second is called diastolic pressure. An example of a blood pressure reading is "120 over 80" (or 120/80). If you are an adult and do not have a medical condition, use this guide to find out if your blood pressure is normal: Normal  First number: below 120.  Second number: below 80. Elevated  First number: 120-129.  Second number: below 80. Hypertension stage 1  First number: 130-139.  Second number: 80-89. Hypertension stage 2  First number: 140 or above.  Second number: 54 or above. Your blood pressure is above normal even if only the top or bottom number is above normal. Follow these instructions at home:  Check your blood  pressure as often as your doctor tells you to.  Take your monitor to your next doctor's appointment. Your doctor will: ? Make sure you are using it correctly. ? Make sure it is working right.  Make sure you understand what your blood pressure numbers should be.  Tell your doctor if your medicines are causing side effects. Contact a doctor if:  Your blood pressure keeps being high. Get help right away if:  Your first blood pressure number is higher than 180.  Your second blood pressure number is higher than 120. This information is not intended to replace advice given to you by your health care provider. Make sure you  discuss any questions you have with your health care provider. Document Released: 11/03/2008 Document Revised: 11/03/2017 Document Reviewed: 04/29/2016 Elsevier Patient Education  2020 [employer].

## 2019-06-25 ENCOUNTER — Other Ambulatory Visit: Payer: Self-pay | Admitting: Family Medicine

## 2019-06-25 DIAGNOSIS — D649 Anemia, unspecified: Secondary | ICD-10-CM

## 2019-06-25 DIAGNOSIS — N183 Chronic kidney disease, stage 3 unspecified: Secondary | ICD-10-CM

## 2019-06-25 MED ORDER — ATORVASTATIN CALCIUM 20 MG PO TABS: 20.0000 mg | ORAL_TABLET | Freq: Every day | ORAL | 3 refills | Status: DC

## 2019-06-25 MED ORDER — FERROUS SULFATE 325 (65 FE) MG PO TBEC: 325.0000 mg | DELAYED_RELEASE_TABLET | Freq: Two times a day (BID) | ORAL | 3 refills | Status: DC

## 2019-06-27 LAB — LIPID PANEL
Chol/HDL Ratio: 5.2 ratio — ABNORMAL HIGH (ref 0.0–4.4)
Cholesterol, Total: 204 mg/dL — ABNORMAL HIGH (ref 100–199)
HDL: 39 mg/dL — ABNORMAL LOW (ref >39)
LDL Calculated: 117 mg/dL — ABNORMAL HIGH (ref 0–99)
Triglycerides: 241 mg/dL — ABNORMAL HIGH (ref 0–149)
VLDL Cholesterol Cal: 48 mg/dL — ABNORMAL HIGH (ref 5–40)

## 2019-06-27 LAB — CMP14+EGFR
ALT: 5 IU/L (ref 0–32)
AST: 9 IU/L (ref 0–40)
Albumin/Globulin Ratio: 1.4 (ref 1.2–2.2)
Albumin: 4.2 g/dL (ref 3.7–4.7)
Alkaline Phosphatase: 74 IU/L (ref 39–117)
BUN/Creatinine Ratio: 22 (ref 12–28)
BUN: 30 mg/dL — ABNORMAL HIGH (ref 8–27)
Bilirubin Total: 0.2 mg/dL (ref 0.0–1.2)
CO2: 23 mmol/L (ref 20–29)
Calcium: 9.8 mg/dL (ref 8.7–10.3)
Chloride: 101 mmol/L (ref 96–106)
Creatinine, Ser: 1.39 mg/dL — ABNORMAL HIGH (ref 0.57–1.00)
GFR calc Af Amer: 44 mL/min/{1.73_m2} — ABNORMAL LOW (ref >59)
GFR calc non Af Amer: 38 mL/min/{1.73_m2} — ABNORMAL LOW (ref >59)
Globulin, Total: 3 g/dL (ref 1.5–4.5)
Glucose: 95 mg/dL (ref 65–99)
Potassium: 4.7 mmol/L (ref 3.5–5.2)
Sodium: 142 mmol/L (ref 134–144)
Total Protein: 7.2 g/dL (ref 6.0–8.5)

## 2019-06-27 LAB — CBC
Hematocrit: 25.6 % — ABNORMAL LOW (ref 34.0–46.6)
Hemoglobin: 7.4 g/dL — CL (ref 11.1–15.9)
MCH: 21.8 pg — ABNORMAL LOW (ref 26.6–33.0)
MCHC: 28.9 g/dL — ABNORMAL LOW (ref 31.5–35.7)
MCV: 76 fL — ABNORMAL LOW (ref 79–97)
Platelets: 236 10*3/uL (ref 150–450)
RBC: 3.39 x10E6/uL — ABNORMAL LOW (ref 3.77–5.28)
RDW: 17.3 % — ABNORMAL HIGH (ref 11.7–15.4)
WBC: 8.3 10*3/uL (ref 3.4–10.8)

## 2019-06-27 LAB — VITAMIN D 25 HYDROXY (VIT D DEFICIENCY, FRACTURES): Vit D, 25-Hydroxy: 45.8 ng/mL (ref 30.0–100.0)

## 2019-07-05 ENCOUNTER — Other Ambulatory Visit: Payer: Self-pay

## 2019-07-08 ENCOUNTER — Other Ambulatory Visit: Payer: Self-pay

## 2019-07-08 ENCOUNTER — Ambulatory Visit (INDEPENDENT_AMBULATORY_CARE_PROVIDER_SITE_OTHER): Payer: Medicare HMO | Admitting: *Deleted

## 2019-07-08 VITALS — BP 133/57 | HR 64

## 2019-07-08 DIAGNOSIS — I10 Essential (primary) hypertension: Secondary | ICD-10-CM

## 2019-07-08 NOTE — Progress Notes (Signed)
Pt is here for BP check BP 133 57 P 64

## 2019-07-16 ENCOUNTER — Telehealth: Payer: Self-pay | Admitting: Family Medicine

## 2019-07-16 NOTE — Chronic Care Management (AMB) (Signed)
Chronic Care Management   Note  07/16/2019 Name: Gabrielle [name] MRN: [id number] DOB: [birth year]  Gabrielle [name] is a 73 y.o. year old female who is a primary care patient of Janora Norlander, DO. I reached out to Brantley Stage by phone today in response to a referral sent by Ms. Patrick [place] [employer]'s health plan.    Ms. Truett was given information about Chronic Care Management services today including:  1. CCM service includes personalized support from designated clinical staff supervised by her physician, including individualized plan of care and coordination with other care providers 2. 24/7 contact phone numbers for assistance for urgent and routine care needs. 3. Service will only be billed when office clinical staff spend 20 minutes or more in a month to coordinate care. 4. Only one practitioner may furnish and bill the service in a calendar month. 5. The patient may stop CCM services at any time (effective at the end of the month) by phone call to the office staff. 6. The patient will be responsible for cost sharing (co-pay) of up to 20% of the service fee (after annual deductible is met).  Patient agreed to services and verbal consent obtained.   Follow up plan: Telephone appointment with CCM team member scheduled for: 08/12/2019  [place]  ??[email or web address]   ??[phone]

## 2019-07-24 NOTE — Progress Notes (Signed)
[facility] Note  07/25/2019     CHIEF COMPLAINT Patient presents for Retina Follow Up   HISTORY OF PRESENT ILLNESS: Gabrielle [name] is a 73 y.o. female who presents to the clinic today for:   HPI    Retina Follow Up    Patient presents with  Wet AMD.  In both eyes.  This started 5 weeks ago.  Severity is moderate.  I, the attending physician,  performed the HPI with the patient and updated documentation appropriately.          Comments    Patient here for 5 weeks  Retina follow up for exu ARMD. Patient states vision is about the same. No eye pain. Patient has 2 new medications added losartan/HCTZ and Atorvastatin.       Last edited by Bernarda Caffey, MD on 07/25/2019  9:37 AM. (History)    pt states her vision is about the same as last time, still has not heard from cataract surgeon regarding cataract surgery.   Referring physician: Janora Norlander, DO [place],   [id number]  HISTORICAL INFORMATION:   Selected notes from the MEDICAL RECORD NUMBER Referred by Dr. Cristela Blue for concern of ARMD OU;  LEE- 01.03.19 Cristela Blue) [BCVA OD: 10/50 OS: 20/80] Ocular Hx- ARMD OU, DME PMH- elevated chol., HTN    CURRENT MEDICATIONS: Current Outpatient Medications (Ophthalmic Drugs)  Medication Sig  . dorzolamide-timolol (COSOPT) 22.3-6.8 MG/ML ophthalmic solution Place 1 drop into both eyes 2 (two) times daily.   Current Facility-Administered Medications (Ophthalmic Drugs)  Medication Route  . aflibercept (EYLEA) SOLN 2 mg Intravitreal  . aflibercept (EYLEA) SOLN 2 mg Intravitreal  . aflibercept (EYLEA) SOLN 2 mg Intravitreal  . aflibercept (EYLEA) SOLN 2 mg Intravitreal  . aflibercept (EYLEA) SOLN 2 mg Intravitreal  . aflibercept (EYLEA) SOLN 2 mg Intravitreal  . aflibercept (EYLEA) SOLN 2 mg Intravitreal  . aflibercept (EYLEA) SOLN 2 mg Intravitreal  . aflibercept (EYLEA) SOLN 2 mg Intravitreal  . aflibercept (EYLEA) SOLN 2 mg  Intravitreal  . aflibercept (EYLEA) SOLN 2 mg Intravitreal  . aflibercept (EYLEA) SOLN 2 mg Intravitreal  . aflibercept (EYLEA) SOLN 2 mg Intravitreal  . aflibercept (EYLEA) SOLN 2 mg Intravitreal  . aflibercept (EYLEA) SOLN 2 mg Intravitreal  . aflibercept (EYLEA) SOLN 2 mg Intravitreal  . aflibercept (EYLEA) SOLN 2 mg Intravitreal   Current Outpatient Medications (Other)  Medication Sig  . alendronate (FOSAMAX) 70 MG tablet Take 1 tablet (70 mg total) by mouth every 7 (seven) days. Take with a full glass of water on an empty stomach.  Marland Kitchen atorvastatin (LIPITOR) 20 MG tablet Take 1 tablet (20 mg total) by mouth daily.  . cholecalciferol (VITAMIN D) 1000 units tablet Take 1,000 Units by mouth daily.  . diclofenac sodium (VOLTAREN) 1 % GEL Apply 4 g topically 4 (four) times daily.  . ferrous sulfate 325 (65 FE) MG EC tablet Take 1 tablet (325 mg total) by mouth 2 (two) times daily with a meal.  . losartan-hydrochlorothiazide (HYZAAR) 100-25 MG tablet Take one (1) by mouth daily.   Current Facility-Administered Medications (Other)  Medication Route  . Bevacizumab (AVASTIN) SOLN 1.25 mg Intravitreal  . Bevacizumab (AVASTIN) SOLN 1.25 mg Intravitreal      REVIEW OF SYSTEMS: ROS    Positive for: Cardiovascular, Eyes   Negative for: Constitutional, Gastrointestinal, Neurological, Skin, Genitourinary, Musculoskeletal, HENT, Endocrine, Respiratory, Psychiatric, Allergic/Imm, Heme/Lymph   Last edited by Patsey Berthold  S on 07/25/2019  8:13 AM. (History)       ALLERGIES Allergies  Allergen Reactions  . Lisinopril     cough    PAST MEDICAL HISTORY Past Medical History:  Diagnosis Date  . Cataract    OU  . H/O cesarean section   . Hx of tonsillectomy   . Hyperlipidemia   . Hypertension   . Macular degeneration    Exu ARMD OU   Past Surgical History:  Procedure Laterality Date  . CARPAL TUNNEL RELEASE Right   . CESAREAN SECTION    . TONSILLECTOMY      FAMILY  HISTORY Family History  Problem Relation Age of Onset  . Macular degeneration Mother   . Stroke Mother   . Hypertension Mother   . Dementia Mother   . Cancer Father        lung  . Diabetes Sister   . Hypertension Sister     SOCIAL HISTORY Social History   Tobacco Use  . Smoking status: Never Smoker  . Smokeless tobacco: Never Used  Substance Use Topics  . Alcohol use: No    Frequency: Never  . Drug use: No         OPHTHALMIC EXAM:  Base Eye Exam    Visual Acuity (Snellen - Linear)      Right Left   Dist cc 20/150 20/100 -2   Dist ph cc NI    Correction: Glasses       Tonometry (Tonopen, 8:10 AM)      Right Left   Pressure 19 15       Pupils      Dark Light Shape React APD   Right 4 3 Round Brisk None   Left 4 3 Round Brisk None       Visual Fields (Counting fingers)      Left Right    Full Full       Extraocular Movement      Right Left    Full, Ortho Full, Ortho       Neuro/Psych    Oriented x3: Yes   Mood/Affect: Normal       Dilation    Both eyes: 1.0% Mydriacyl, 2.5% Phenylephrine @ 8:10 AM        Slit Lamp and Fundus Exam    Slit Lamp Exam      Right Left   Lids/Lashes Dermatochalasis - upper lid Dermatochalasis - upper lid   Conjunctiva/Sclera White and quiet White and quiet   Cornea Arcus, 1-2+ Punctate epithelial erosions Arcus, 1-2+ Punctate epithelial erosions   Anterior Chamber Deep and quiet Deep and quiet   Iris Round and well dilated Round and well dilated   Lens 3+ Nuclear sclerosis, 3+ Cortical cataract, trace Posterior subcapsular cataract centrally 2-3+ Nuclear sclerosis, 2+ Cortical cataract, trace Posterior subcapsular cataract centrally   Vitreous Vitreous syneresis, Posterior vitreous detachment Vitreous syneresis, Posterior vitreous detachment       Fundus Exam      Right Left   Disc pink and sharp, compact pink and sharp   C/D Ratio 0.2 0.3   Macula Blunted foveal reflex, Drusen, Pigment clumping and atrophy,  central thickening/disciform scar, +PED, no heme Blunted foveal reflex, central thickening, RPE clumping and atrophy, Drusen, RPE rip, improved IRF/SRF overlying PED, no heme   Vessels Vascular attenuation Vascular attenuation   Periphery Attached, mild Reticular degeneration Attached, mild Reticular degeneration        Refraction    Wearing Rx  Sphere Cylinder Axis Add   Right -3.25 +1.00 158 +2.50   Left -3.50 +0.50 180 +2.50   Type: PAL          IMAGING AND PROCEDURES  Imaging and Procedures for 03/21/18  OCT, Retina - OU - Both Eyes       Right Eye Quality was good. Central Foveal Thickness: 551. Progression has been stable. Findings include pigment epithelial detachment, epiretinal membrane, subretinal hyper-reflective material, retinal drusen , abnormal foveal contour, no SRF, intraretinal fluid, disciform scar, outer retinal tubulation (Stable sub-retinal scar, trace interval improvement in cystic changes overlying sub-retinal scar).   Left Eye Quality was good. Central Foveal Thickness: 328. Progression has been stable. Findings include subretinal hyper-reflective material, pigment epithelial detachment, intraretinal fluid, retinal drusen , abnormal foveal contour, no SRF, outer retinal atrophy (Interval improvement in IRF).   Notes *Images captured and stored on drive  Diagnosis / Impression:  Exudative ARMD OU OD: Stable sub-retinal scar, trace interval improvement in cystic changes overlying sub-retinal scar OS: Interval improvement in IRF  Clinical management:  See below  Abbreviations: NFP - Normal foveal profile. CME - cystoid macular edema. PED - pigment epithelial detachment. IRF - intraretinal fluid. SRF - subretinal fluid. EZ - ellipsoid zone. ERM - epiretinal membrane. ORA - outer retinal atrophy. ORT - outer retinal tubulation. SRHM - subretinal hyper-reflective material        Intravitreal Injection, Pharmacologic Agent - OS - Left Eye        Time Out 07/25/2019. 8:24 AM. Confirmed correct patient, procedure, site, and patient consented.   Anesthesia Topical anesthesia was used. Anesthetic medications included Lidocaine 2%, Proparacaine 0.5%.   Procedure Preparation included 5% betadine to ocular surface, eyelid speculum. A 30 gauge needle was used.   Injection:  2 mg aflibercept Alfonse Flavors) SOLN   NDC: A3590391, Lot: [id number], Expiration date: 03/24/2020   Route: Intravitreal, Site: Left Eye, Waste: 0.5 mL  Post-op Post injection exam found visual acuity of at least counting fingers. The patient tolerated the procedure well. There were no complications. The patient received written and verbal post procedure care education.                 ASSESSMENT/PLAN:    ICD-10-CM   1. Exudative age-related macular degeneration of both eyes with active choroidal neovascularization (HCC)  H35.3231 Intravitreal Injection, Pharmacologic Agent - OS - Left Eye    aflibercept (EYLEA) SOLN 2 mg  2. Retinal edema  H35.81 OCT, Retina - OU - Both Eyes  3. Posterior vitreous detachment of both eyes  H43.813   4. Combined form of age-related cataract, both eyes  H25.813   5. Ocular hypertension, bilateral  H40.053     1,2. Exudative age related macular degeneration, both eyes.    - severe exudative disease with very active CNVM OU at presentation in January 2019  - S/P IVA OD #1 (01.04.19), #2 (02.15.19)  - S/P IVA OS #1 (01.18.19), #2 (02.15.19)  - switched to Eylea 3.18.19 due to severity of disease  - S/P IVE OD #1 (03.18.19), #2 (04.17.19), #3 (05.15.19) -- injections held due to stable disciform scar, #4 (10.02.19) -- now on a q3-4 month interval schedule, #5 (01.15.20), #6 (06.04.20)  - S/P IVE OS #1 (03.18.19), #2 (04.17.19), #3 (05.15.19), #4 (06.12.19), #5 (07.10.19), #6 (08.07.19), #7 (09.04.19), #8 (10.02.19), #9 (11.06.19), #10 (12.11.19), #11 (01.15.20), #12 (02.20.20), #13 (03.26.20), #14 (04.30.20), #15 (06.04.20),  #16 (07.16.20)  - OCT  OS today shows persistent but improved IRF  overlying PED  - OD with significant subretinal disciform scar - trace improvement in cystic changes  - will plan to treat OD ~q3-5 mos for maintenance/prevention -- last injection 06.04.20  - recommend IVE OS #17 today, 08.20. 20  - pt wishes to be treated with IVE  - RBA of procedure discussed, questions answered  - informed consent obtained and signed  - see procedure note  - Eylea paperwork filled out on 02.15.19 and fully approved through [employer] -- renewed 12/2018  - f/u in 5 wks for DFE/OCT/possible injection OU  3. PVD / vitreous syneresis  - Discussed findings and prognosis  - No RT or RD on 360  exam  - Reviewed s/s of RT/RD  - Strict return precautions for any such RT/RD signs/symptoms  4. Combined form of age related cataract OU-  - The symptoms of cataract, surgical options, and treatments and risks were discussed with patient.  - discussed diagnosis and progression  - approaching visual significance, but long discussion about limitations of cataract surgery due to presence of ARMD OU  - pt wishes to proceed with referral to Dr. Hassie Bruce-- [phone] ext [phone] for Lynnae Prude, Surgical Coordinator -- waiting until after Labor Day and after pt has hearing aids to schedule consult  5. Ocular Hypertension OU  - IOP okay today -- 19 OD, 15 OS  - cont Cosopt BID OU   Ophthalmic Meds Ordered this visit:  Meds ordered this encounter  Medications  . aflibercept (EYLEA) SOLN 2 mg       Return in about 5 weeks (around 08/29/2019) for f/u exu ARMD OS, DFE, OCT.  There are no Patient Instructions on file for this visit.   Explained the diagnoses, plan, and follow up with the patient and they expressed understanding.  Patient expressed understanding of the importance of proper follow up care.   This document serves as a record of services personally performed by Gardiner Sleeper, MD, PhD. It was created  on their behalf by Ernest Mallick, OA, an ophthalmic assistant. The creation of this record is the provider's dictation and/or activities during the visit.    Electronically signed by: Ernest Mallick, OA  08.19.2020 9:39 AM    Gardiner Sleeper, M.D., Ph.D. Diseases & Surgery of the Retina and Vitreous [facility]  I have reviewed the above documentation for accuracy and completeness, and I agree with the above. Gardiner Sleeper, M.D., Ph.D. 07/25/19 9:40 AM    Abbreviations: M myopia (nearsighted); A astigmatism; H hyperopia (farsighted); P presbyopia; Mrx spectacle prescription;  CTL contact lenses; OD right eye; OS left eye; OU both eyes  XT exotropia; ET esotropia; PEK punctate epithelial keratitis; PEE punctate epithelial erosions; DES dry eye syndrome; MGD meibomian gland dysfunction; ATs artificial tears; PFAT's preservative free artificial tears; [place] nuclear sclerotic cataract; PSC posterior subcapsular cataract; ERM epi-retinal membrane; PVD posterior vitreous detachment; RD retinal detachment; DM diabetes mellitus; DR diabetic retinopathy; NPDR non-proliferative diabetic retinopathy; PDR proliferative diabetic retinopathy; CSME clinically significant macular edema; DME diabetic macular edema; dbh dot blot hemorrhages; CWS cotton wool spot; POAG primary open angle glaucoma; C/D cup-to-disc ratio; HVF humphrey visual field; GVF goldmann visual field; OCT optical coherence tomography; IOP intraocular pressure; BRVO Branch retinal vein occlusion; CRVO central retinal vein occlusion; CRAO central retinal artery occlusion; BRAO branch retinal artery occlusion; RT retinal tear; SB scleral buckle; PPV pars plana vitrectomy; VH Vitreous hemorrhage; PRP panretinal laser photocoagulation; IVK intravitreal kenalog; VMT vitreomacular traction; MH  Macular hole;  NVD neovascularization of the disc; NVE neovascularization elsewhere; AREDS age related eye disease study; ARMD age related  macular degeneration; POAG primary open angle glaucoma; EBMD epithelial/anterior basement membrane dystrophy; ACIOL anterior chamber intraocular lens; IOL intraocular lens; PCIOL posterior chamber intraocular lens; Phaco/IOL phacoemulsification with intraocular lens placement; [place] photorefractive keratectomy; LASIK laser assisted in situ keratomileusis; HTN hypertension; DM diabetes mellitus; COPD chronic obstructive pulmonary disease

## 2019-07-25 ENCOUNTER — Ambulatory Visit (INDEPENDENT_AMBULATORY_CARE_PROVIDER_SITE_OTHER): Payer: Medicare HMO | Admitting: Ophthalmology

## 2019-07-25 ENCOUNTER — Other Ambulatory Visit: Payer: Self-pay

## 2019-07-25 ENCOUNTER — Encounter (INDEPENDENT_AMBULATORY_CARE_PROVIDER_SITE_OTHER): Payer: Self-pay | Admitting: Ophthalmology

## 2019-07-25 DIAGNOSIS — H3581 Retinal edema: Secondary | ICD-10-CM | POA: Diagnosis not present

## 2019-07-25 DIAGNOSIS — H25813 Combined forms of age-related cataract, bilateral: Secondary | ICD-10-CM

## 2019-07-25 DIAGNOSIS — H353231 Exudative age-related macular degeneration, bilateral, with active choroidal neovascularization: Secondary | ICD-10-CM

## 2019-07-25 DIAGNOSIS — H40053 Ocular hypertension, bilateral: Secondary | ICD-10-CM | POA: Diagnosis not present

## 2019-07-25 DIAGNOSIS — H43813 Vitreous degeneration, bilateral: Secondary | ICD-10-CM | POA: Diagnosis not present

## 2019-07-25 MED ORDER — AFLIBERCEPT 2MG/0.05ML IZ SOLN FOR KALEIDOSCOPE
2.0000 mg | INTRAVITREAL | Status: AC | PRN
  Administered 2019-07-25: 2 mg via INTRAVITREAL

## 2019-08-12 ENCOUNTER — Telehealth: Payer: Medicare HMO

## 2019-08-19 ENCOUNTER — Telehealth: Payer: Medicare HMO

## 2019-08-27 NOTE — Progress Notes (Addendum)
[facility] Note  08/29/2019     CHIEF COMPLAINT Patient presents for Retina Follow Up   HISTORY OF PRESENT ILLNESS: Gabrielle [name] is a 73 y.o. female who presents to the clinic today for:   HPI    Retina Follow Up    Patient presents with  Wet AMD.  In both eyes.  This started weeks ago.  Severity is moderate.  Duration of weeks.  Since onset it is stable.  I, the attending physician,  performed the HPI with the patient and updated documentation appropriately.          Comments    Patient states her vision is about the same in both eyes.  She denies eye pain or discomfort and denies any new or worsening floaters or fol OU.       Last edited by Bernarda Caffey, MD on 08/29/2019  8:25 AM. (History)    pt states she got a new hearing aid and her hearing has improved, she states she has an appt with a cataract sx on October 19, she states she can't remember the name of the surgeon, she states her eyes are stable from last visit   Referring physician: Janora Norlander, [place],   [id number]  HISTORICAL INFORMATION:   Selected notes from the [place] Referred by Dr. Cristela Blue for concern of ARMD OU;  LEE- 01.03.19 Cristela Blue) [BCVA OD: 10/50 OS: 20/80] Ocular Hx- ARMD OU, DME PMH- elevated chol., HTN    CURRENT MEDICATIONS: Current Outpatient Medications (Ophthalmic Drugs)  Medication Sig  . dorzolamide-timolol (COSOPT) 22.3-6.8 MG/ML ophthalmic solution Place 1 drop into both eyes 2 (two) times daily.   Current Facility-Administered Medications (Ophthalmic Drugs)  Medication Route  . aflibercept (EYLEA) SOLN 2 mg Intravitreal  . aflibercept (EYLEA) SOLN 2 mg Intravitreal  . aflibercept (EYLEA) SOLN 2 mg Intravitreal  . aflibercept (EYLEA) SOLN 2 mg Intravitreal  . aflibercept (EYLEA) SOLN 2 mg Intravitreal  . aflibercept (EYLEA) SOLN 2 mg Intravitreal  . aflibercept (EYLEA) SOLN 2 mg Intravitreal  .  aflibercept (EYLEA) SOLN 2 mg Intravitreal  . aflibercept (EYLEA) SOLN 2 mg Intravitreal  . aflibercept (EYLEA) SOLN 2 mg Intravitreal  . aflibercept (EYLEA) SOLN 2 mg Intravitreal  . aflibercept (EYLEA) SOLN 2 mg Intravitreal  . aflibercept (EYLEA) SOLN 2 mg Intravitreal  . aflibercept (EYLEA) SOLN 2 mg Intravitreal  . aflibercept (EYLEA) SOLN 2 mg Intravitreal  . aflibercept (EYLEA) SOLN 2 mg Intravitreal  . aflibercept (EYLEA) SOLN 2 mg Intravitreal   Current Outpatient Medications (Other)  Medication Sig  . alendronate (FOSAMAX) 70 MG tablet Take 1 tablet (70 mg total) by mouth every 7 (seven) days. Take with a full glass of water on an empty stomach.  Marland Kitchen atorvastatin (LIPITOR) 20 MG tablet Take 1 tablet (20 mg total) by mouth daily.  . cholecalciferol (VITAMIN D) 1000 units tablet Take 1,000 Units by mouth daily.  . diclofenac sodium (VOLTAREN) 1 % GEL Apply 4 g topically 4 (four) times daily.  . ferrous sulfate 325 (65 FE) MG EC tablet Take 1 tablet (325 mg total) by mouth 2 (two) times daily with a meal.  . losartan-hydrochlorothiazide (HYZAAR) 100-25 MG tablet Take one (1) by mouth daily.   Current Facility-Administered Medications (Other)  Medication Route  . Bevacizumab (AVASTIN) SOLN 1.25 mg Intravitreal  . Bevacizumab (AVASTIN) SOLN 1.25 mg Intravitreal      REVIEW OF SYSTEMS: ROS  Positive for: Cardiovascular, Eyes   Negative for: Constitutional, Gastrointestinal, Neurological, Skin, Genitourinary, Musculoskeletal, HENT, Endocrine, Respiratory, Psychiatric, Allergic/Imm, Heme/Lymph   Last edited by Doneen Poisson on 08/29/2019  8:14 AM. (History)       ALLERGIES Allergies  Allergen Reactions  . Lisinopril     cough    PAST MEDICAL HISTORY Past Medical History:  Diagnosis Date  . Cataract    OU  . H/O cesarean section   . Hx of tonsillectomy   . Hyperlipidemia   . Hypertension   . Macular degeneration    Exu ARMD OU   Past Surgical History:   Procedure Laterality Date  . CARPAL TUNNEL RELEASE Right   . CESAREAN SECTION    . TONSILLECTOMY      FAMILY HISTORY Family History  Problem Relation Age of Onset  . Macular degeneration Mother   . Stroke Mother   . Hypertension Mother   . Dementia Mother   . Cancer Father        lung  . Diabetes Sister   . Hypertension Sister     SOCIAL HISTORY Social History   Tobacco Use  . Smoking status: Never Smoker  . Smokeless tobacco: Never Used  Substance Use Topics  . Alcohol use: No    Frequency: Never  . Drug use: No         OPHTHALMIC EXAM:  Base Eye Exam    Visual Acuity (Snellen - Linear)      Right Left   Dist cc 20/150 +1 20/100 -2   Dist ph cc NI NI   Correction: Glasses       Tonometry (Tonopen, 8:14 AM)      Right Left   Pressure 18 16       Pupils      Dark Light Shape React APD   Right 3 2 Round Brisk 0   Left 3 2 Round Brisk 0       Visual Fields      Left Right    Full Full       Extraocular Movement      Right Left    Full Full       Neuro/Psych    Oriented x3: Yes   Mood/Affect: Normal       Dilation    Both eyes: 1.0% Mydriacyl, 2.5% Phenylephrine @ 8:14 AM        Slit Lamp and Fundus Exam    Slit Lamp Exam      Right Left   Lids/Lashes Dermatochalasis - upper lid Dermatochalasis - upper lid   Conjunctiva/Sclera White and quiet White and quiet   Cornea Arcus, 1-2+ Punctate epithelial erosions Arcus, 1-2+ Punctate epithelial erosions   Anterior Chamber Deep and quiet Deep and quiet   Iris Round and well dilated Round and well dilated   Lens 3+ Nuclear sclerosis, 3+ Cortical cataract, trace Posterior subcapsular cataract centrally 2-3+ Nuclear sclerosis, 2+ Cortical cataract, trace Posterior subcapsular cataract centrally   Vitreous Vitreous syneresis, Posterior vitreous detachment Vitreous syneresis, Posterior vitreous detachment       Fundus Exam      Right Left   Disc pink and sharp, compact pink and sharp, Compact    C/D Ratio 0.2 0.2   Macula Blunted foveal reflex, Drusen, Pigment clumping and atrophy, central thickening/disciform scar, +PED, no heme Blunted foveal reflex, central thickening, RPE clumping and atrophy, Drusen, RPE rip, improved IRF/SRF overlying PED   Vessels Vascular attenuation, Tortuous Vascular attenuation, Tortuous  Periphery Attached, mild Reticular degeneration Attached, mild Reticular degeneration        Refraction    Wearing Rx      Sphere Cylinder Axis Add   Right -3.25 +1.00 158 +2.50   Left -3.50 +0.50 180 +2.50   Type: PAL          IMAGING AND PROCEDURES  Imaging and Procedures for 03/21/18  OCT, Retina - OU - Both Eyes       Right Eye Quality was good. Central Foveal Thickness: 237. Progression has been stable. Findings include pigment epithelial detachment, epiretinal membrane, subretinal hyper-reflective material, retinal drusen , abnormal foveal contour, no SRF, intraretinal fluid, disciform scar, outer retinal tubulation (Stable sub-retinal scar, trace persistent cystic changes overlying sub-retinal scar).   Left Eye Quality was good. Central Foveal Thickness: 352. Progression has been stable. Findings include subretinal hyper-reflective material, pigment epithelial detachment, intraretinal fluid, retinal drusen , abnormal foveal contour, no SRF, outer retinal atrophy (Tr IRF overlying SRHM/PED).   Notes *Images captured and stored on drive  Diagnosis / Impression:  Exudative ARMD OU OD: Stable sub-retinal scar, trace persistent cystic changes overlying sub-retinal scar OS: Tr IRF overlying SRHM/PED  Clinical management:  See below  Abbreviations: NFP - Normal foveal profile. CME - cystoid macular edema. PED - pigment epithelial detachment. IRF - intraretinal fluid. SRF - subretinal fluid. EZ - ellipsoid zone. ERM - epiretinal membrane. ORA - outer retinal atrophy. ORT - outer retinal tubulation. SRHM - subretinal hyper-reflective material         Intravitreal Injection, Pharmacologic Agent - OS - Left Eye       Time Out 08/29/2019. 8:28 AM. Confirmed correct patient, procedure, site, and patient consented.   Anesthesia Topical anesthesia was used. Anesthetic medications included Lidocaine 2%, Proparacaine 0.5%.   Procedure Preparation included 5% betadine to ocular surface, eyelid speculum. A 30 gauge needle was used.   Injection:  2 mg aflibercept Alfonse Flavors) SOLN   NDC: A3590391, Lot: [id number], Expiration date: 02/22/2020   Route: Intravitreal, Site: Left Eye, Waste: 0.05 mL  Post-op Post injection exam found visual acuity of at least counting fingers. The patient tolerated the procedure well. There were no complications. The patient received written and verbal post procedure care education.                 ASSESSMENT/PLAN:    ICD-10-CM   1. Exudative age-related macular degeneration of both eyes with active choroidal neovascularization (HCC)  H35.3231 Intravitreal Injection, Pharmacologic Agent - OS - Left Eye    aflibercept (EYLEA) SOLN 2 mg    CANCELED: Intravitreal Injection, Pharmacologic Agent - OD - Right Eye  2. Retinal edema  H35.81 OCT, Retina - OU - Both Eyes  3. Posterior vitreous detachment of both eyes  H43.813   4. Combined form of age-related cataract, both eyes  H25.813   5. Ocular hypertension, bilateral  H40.053     1,2. Exudative age related macular degeneration, both eyes.    - severe exudative disease with very active CNVM OU at presentation in January 2019  - S/P IVA OD #1 (01.04.19), #2 (02.15.19)  - S/P IVA OS #1 (01.18.19), #2 (02.15.19)  - switched to Eylea 3.18.19 due to severity of disease  - S/P IVE OD #1 (03.18.19), #2 (04.17.19), #3 (05.15.19) -- injections held due to stable disciform scar, #4 (10.02.19) -- now on a q3-4 month interval schedule, #5 (01.15.20), #6 (06.04.20)  - S/P IVE OS #1 (03.18.19), #2 (04.17.19), #3 (05.15.19), #4 (06.12.19), #  5 (07.10.19), #6 (08.07.19),  #7 (09.04.19), #8 (10.02.19), #9 (11.06.19), #10 (12.11.19), #11 (01.15.20), #12 (02.20.20), #13 (03.26.20), #14 (04.30.20), #15 (06.04.20), #16 (07.16.20), # 17 (08.20.20)  - OCT OS today shows tr persistent IRF overlying PED  - OD with significant subretinal disciform scar - trace improvement in cystic changes  - will plan to treat OD ~q3-5 mos for maintenance/prevention -- last injection 06.04.20  - recommend IVE OS #18 today, 09.24.20 -- maintenance  - pt wishes to be treated with IVE  - RBA of procedure discussed, questions answered  - informed consent obtained and signed  - see procedure note  - Eylea4U paperwork filled out on 02.15.19 and fully approved through [employer] -- renewed 12/2018  - f/u in 5 wks for DFE/OCT/possible injection OU -- consider tx and extend OS following cataract surgery  3. PVD / vitreous syneresis  - Discussed findings and prognosis  - No RT or RD on 360  exam  - Reviewed s/s of RT/RD  - Strict return precautions for any such RT/RD signs/symptoms  4. Combined form of age related cataract OU-  - The symptoms of cataract, surgical options, and treatments and risks were discussed with patient.  - discussed diagnosis and progression  - approaching visual significance, but long discussion about limitations of cataract surgery due to presence of ARMD OU  - pt wishes to proceed with referral to Dr. Hassie Bruce-- [phone] ext [phone] for Lynnae Prude, Surgical Coordinator -- appt scheduled for October 19  5. Ocular Hypertension OU  - IOP okay today -- 18 OD, 16 OS  - cont Cosopt bid OU   Ophthalmic Meds Ordered this visit:  Meds ordered this encounter  Medications  . aflibercept (EYLEA) SOLN 2 mg       Return in about 5 weeks (around 10/03/2019) for f/u exu ARMD , DFE, OCT.  There are no Patient Instructions on file for this visit.   Explained the diagnoses, plan, and follow up with the patient and they expressed understanding.  Patient expressed  understanding of the importance of proper follow up care.   This document serves as a record of services personally performed by Gardiner Sleeper, MD, PhD. It was created on their behalf by Roselee Nova, COMT. The creation of this record is the provider's dictation and/or activities during the visit.  Electronically signed by: Roselee Nova, COMT 08/29/19 11:56 AM   Gardiner Sleeper, M.D., Ph.D. Diseases & Surgery of the Retina and [place] 08/29/19  I have reviewed the above documentation for accuracy and completeness, and I agree with the above. Gardiner Sleeper, M.D., Ph.D. 08/29/19 11:58 AM   Abbreviations: M myopia (nearsighted); A astigmatism; H hyperopia (farsighted); P presbyopia; Mrx spectacle prescription;  CTL contact lenses; OD right eye; OS left eye; OU both eyes  XT exotropia; ET esotropia; PEK punctate epithelial keratitis; PEE punctate epithelial erosions; DES dry eye syndrome; MGD meibomian gland dysfunction; ATs artificial tears; PFAT's preservative free artificial tears; Schleicher nuclear sclerotic cataract; PSC posterior subcapsular cataract; ERM epi-retinal membrane; PVD posterior vitreous detachment; RD retinal detachment; DM diabetes mellitus; DR diabetic retinopathy; NPDR non-proliferative diabetic retinopathy; PDR proliferative diabetic retinopathy; CSME clinically significant macular edema; DME diabetic macular edema; dbh dot blot hemorrhages; CWS cotton wool spot; POAG primary open angle glaucoma; C/D cup-to-disc ratio; HVF humphrey visual field; GVF goldmann visual field; OCT optical coherence tomography; IOP intraocular pressure; BRVO Branch retinal vein occlusion; CRVO central retinal vein occlusion; CRAO central retinal  artery occlusion; BRAO branch retinal artery occlusion; RT retinal tear; SB scleral buckle; PPV pars plana vitrectomy; VH Vitreous hemorrhage; PRP panretinal laser photocoagulation; IVK intravitreal kenalog; VMT vitreomacular  traction; MH Macular hole;  NVD neovascularization of the disc; NVE neovascularization elsewhere; AREDS age related eye disease study; ARMD age related macular degeneration; POAG primary open angle glaucoma; EBMD epithelial/anterior basement membrane dystrophy; ACIOL anterior chamber intraocular lens; IOL intraocular lens; PCIOL posterior chamber intraocular lens; Phaco/IOL phacoemulsification with intraocular lens placement; [place] photorefractive keratectomy; LASIK laser assisted in situ keratomileusis; HTN hypertension; DM diabetes mellitus; COPD chronic obstructive pulmonary disease

## 2019-08-28 ENCOUNTER — Ambulatory Visit (INDEPENDENT_AMBULATORY_CARE_PROVIDER_SITE_OTHER): Payer: Medicare HMO | Admitting: *Deleted

## 2019-08-28 DIAGNOSIS — I10 Essential (primary) hypertension: Secondary | ICD-10-CM | POA: Diagnosis not present

## 2019-08-28 DIAGNOSIS — E7849 Other hyperlipidemia: Secondary | ICD-10-CM

## 2019-08-28 DIAGNOSIS — D649 Anemia, unspecified: Secondary | ICD-10-CM | POA: Diagnosis not present

## 2019-08-28 NOTE — Patient Instructions (Addendum)
Visit Information  Goals Addressed            This Visit's Progress     Patient Stated   . "I would like to work with the CCM to help me with my medical conditions" (pt-stated)       Current Barriers:  . Chronic Disease Management support and education needs related to anemia, hypertension, hyperlipidemia, and CKD  Nurse Case Manager Clinical Goal(s):  Marland Kitchen Over the next 30 days, patient will meet with RN Care Manager to develop disease specific goals.   Interventions:  . Chart reviewed including recent office notes and labs . Discussed CCM services and my role as the Consulting civil engineer . Reviewed current medications with patient . Discussed general PCP follow-up recommendations . Provided CCM contact information  Patient Self Care Activities:  . Performs ADL's independently . Performs IADL's independently  Initial goal documentation        Print copy of patient instructions provided by mail.   Ms. Politte was given information about Chronic Care Management services today including:  1. CCM service includes personalized support from designated clinical staff supervised by her physician, including individualized plan of care and coordination with other care providers 2. 24/7 contact phone numbers for assistance for urgent and routine care needs. 3. Service will only be billed when office clinical staff spend 20 minutes or more in a month to coordinate care. 4. Only one practitioner may furnish and bill the service in a calendar month. 5. The patient may stop CCM services at any time (effective at the end of the month) by phone call to the office staff. 6. The patient will be responsible for cost sharing (co-pay) of up to 20% of the service fee (after annual deductible is met).  Patient agreed to services and verbal consent obtained.   Follow-up Plan  deferred most screening questions until our next visit and focused on explaining the CCM program today.   I provided Ms Boling  with my direct telephone number and advised to reach out with any nursing care needs  RN CCM will follow-up over the next 30 days  Patient is due for a follow-up with Dr Lajuana Ripple for anemia  Will mail patient information on CCM   Chong Sicilian, BSN, RN-BC [place] [facility] Direct Dial: [phone]

## 2019-08-28 NOTE — Chronic Care Management (AMB) (Signed)
Chronic Care Management   Initial Visit Note  08/28/2019 Name: GETHSEMANE FISCHLER MRN: [id number] DOB: 09/17/1946  Referred by: Janora Norlander, DO (healthplan recommendation) Reason for Visit: Chronic Care Management (Initial CCM Visit)   ROSIA SYME is a 73 y.o. year old female who is a primary care patient of Janora Norlander, DO. The CCM team was consulted for assistance with chronic disease management and care coordination needs.  Ms. Thursby was previously contacted by a member of the CCM team and given information about Chronic Care Management services. she acknowledged the components of CCM and gave verbal consent for these services on 07/16/2019.  Review of patient status, including review of consultants reports, relevant laboratory and other test results, and collaboration with appropriate care team members and the patient's provider was performed as part of comprehensive patient evaluation and provision of chronic care management services.     Subjective: I spoke with Ms. Ciampa by telephone today. We discussed her general health, current medications,follow-up plans, and mostly how chronic care management can assist her in self managing her chronic medical conditions. She does not monitor her blood pressure regularly but does have her neighbor check it periodically. States that it is usually normal. She is scheduled for an ophthalmology follow-up and will likely need cataract removal. She did not have any other concerns today regarding her chronic medical conditions.   Objective:  Medication List alendronate 70 MG tablet Commonly known as: FOSAMAX Take 1 tablet (70 mg total) by mouth every 7 (seven) days. Take with a full glass of water on an empty stomach.   atorvastatin 20 MG tablet Commonly known as: LIPITOR Take 1 tablet (20 mg total) by mouth daily.   cholecalciferol 25 MCG (1000 UT) tablet Commonly known as: VITAMIN D Take 1,000 Units by mouth daily.    diclofenac sodium 1 % Gel Commonly known as: VOLTAREN Apply 4 g topically 4 (four) times daily.   dorzolamide-timolol 22.3-6.8 MG/ML ophthalmic solution Commonly known as: COSOPT Place 1 drop into both eyes 2 (two) times daily.   ferrous sulfate 325 (65 FE) MG EC tablet Take 1 tablet (325 mg total) by mouth 2 (two) times daily with a meal.   losartan-hydrochlorothiazide 100-25 MG tablet Commonly known as: HYZAAR Take one (1) by mouth daily.       Wt Readings from Last 3 Encounters:  06/24/19 158 lb (71.7 kg)  08/02/18 153 lb 3.2 oz (69.5 kg)  04/02/18 155 lb (70.3 kg)    BMI Readings from Last 3 Encounters:  06/24/19 27.99 kg/m  08/02/18 28.02 kg/m  04/02/18 28.35 kg/m    BP Readings from Last 3 Encounters:  07/08/19 (!) 133/57  06/24/19 (!) 148/68  08/02/18 126/64      Lab Results  Component Value Date   WBC 8.3 06/24/2019   HGB 7.4 (LL) 06/24/2019   HCT 25.6 (L) 06/24/2019   PLT 236 06/24/2019   GLUCOSE 95 06/24/2019   CHOL 204 (H) 06/24/2019   TRIG 241 (H) 06/24/2019   HDL 39 (L) 06/24/2019   LDLCALC 117 (H) 06/24/2019   ALT 5 06/24/2019   AST 9 06/24/2019   NA 142 06/24/2019   K 4.7 06/24/2019   CL 101 06/24/2019   CREATININE 1.39 (H) 06/24/2019   BUN 30 (H) 06/24/2019   CO2 23 06/24/2019   TSH 1.480 01/03/2018   HGBA1C 6.0 01/03/2018      Assessment & Plan: Goals Addressed  This Visit's Progress     Patient Stated   . "I would like to work with the CCM to help me with my medical conditions" (pt-stated)       Current Barriers:  . Chronic Disease Management support and education needs related to anemia, hypertension, hyperlipidemia, and CKD  Nurse Case Manager Clinical Goal(s):  Marland Kitchen Over the next 30 days, patient will meet with RN Care Manager to develop disease specific goals.   Interventions:  . Chart reviewed including recent office notes and labs . Discussed CCM services and my role as the Consulting civil engineer . Reviewed  current medications with patient . Discussed general PCP follow-up recommendations . Provided CCM contact information  Patient Self Care Activities:  . Performs ADL's independently . Performs IADL's independently  Initial goal documentation         Follow Up:  Because Ms Humm seemed mildly anxious and  Uncertain about the nature of my call, I deferred most screening questions until our next visit and focused on explaining the CCM program and building trust today.   I provided Ms Attwood with my direct telephone number and advised to reach out with any nursing care needs  RN CCM will follow-up over the next 30 days  Patient is due for a follow-up with Dr Lajuana Ripple for anemia  Will mail patient information on CCM   Chong Sicilian, RN-BC, BSN Nurse Care Manager [place] [phone]

## 2019-08-29 ENCOUNTER — Other Ambulatory Visit: Payer: Self-pay

## 2019-08-29 ENCOUNTER — Ambulatory Visit (INDEPENDENT_AMBULATORY_CARE_PROVIDER_SITE_OTHER): Payer: Medicare HMO | Admitting: Ophthalmology

## 2019-08-29 ENCOUNTER — Encounter (INDEPENDENT_AMBULATORY_CARE_PROVIDER_SITE_OTHER): Payer: Self-pay | Admitting: Ophthalmology

## 2019-08-29 DIAGNOSIS — H43813 Vitreous degeneration, bilateral: Secondary | ICD-10-CM | POA: Diagnosis not present

## 2019-08-29 DIAGNOSIS — H353231 Exudative age-related macular degeneration, bilateral, with active choroidal neovascularization: Secondary | ICD-10-CM

## 2019-08-29 DIAGNOSIS — H25813 Combined forms of age-related cataract, bilateral: Secondary | ICD-10-CM | POA: Diagnosis not present

## 2019-08-29 DIAGNOSIS — H40053 Ocular hypertension, bilateral: Secondary | ICD-10-CM

## 2019-08-29 DIAGNOSIS — H3581 Retinal edema: Secondary | ICD-10-CM

## 2019-08-29 MED ORDER — AFLIBERCEPT 2MG/0.05ML IZ SOLN FOR KALEIDOSCOPE
2.0000 mg | INTRAVITREAL | Status: AC | PRN
  Administered 2019-08-29: 2 mg via INTRAVITREAL

## 2019-09-09 ENCOUNTER — Other Ambulatory Visit: Payer: Self-pay | Admitting: *Deleted

## 2019-09-09 MED ORDER — ALENDRONATE SODIUM 70 MG PO TABS: 70.0000 mg | ORAL_TABLET | ORAL | 11 refills | Status: DC

## 2019-09-23 DIAGNOSIS — H25813 Combined forms of age-related cataract, bilateral: Secondary | ICD-10-CM | POA: Diagnosis not present

## 2019-09-23 DIAGNOSIS — H353231 Exudative age-related macular degeneration, bilateral, with active choroidal neovascularization: Secondary | ICD-10-CM | POA: Diagnosis not present

## 2019-09-30 NOTE — Progress Notes (Signed)
[facility] Note  10/03/2019     CHIEF COMPLAINT Patient presents for Retina Follow Up   HISTORY OF PRESENT ILLNESS: Gabrielle [name] is a 73 y.o. female who presents to the clinic today for:   HPI    Retina Follow Up    Patient presents with  Dry AMD.  In both eyes.  Since onset it is stable.  I, the attending physician,  performed the HPI with the patient and updated documentation appropriately.          Comments    F/U EXU AMD OU. Patient states her vision has been "ok", denies new visual issues/onsets. Patient reports she is scheduled for cataract sx OS 10/11/19 with Dr. Marisa Hua, if Hgb results is good.        Last edited by Bernarda Caffey, MD on 10/03/2019  9:31 AM. (History)       Referring physician: Thalia Bloodgood, OD No address on file  HISTORICAL INFORMATION:   Selected notes from the MEDICAL RECORD NUMBER Referred by Dr. Cristela Blue for concern of ARMD OU;  LEE- 01.03.19 Cristela Blue) [BCVA OD: 10/50 OS: 20/80] Ocular Hx- ARMD OU, DME PMH- elevated chol., HTN    CURRENT MEDICATIONS: Current Outpatient Medications (Ophthalmic Drugs)  Medication Sig  . dorzolamide-timolol (COSOPT) 22.3-6.8 MG/ML ophthalmic solution Place 1 drop into both eyes 2 (two) times daily.   Current Facility-Administered Medications (Ophthalmic Drugs)  Medication Route  . aflibercept (EYLEA) SOLN 2 mg Intravitreal  . aflibercept (EYLEA) SOLN 2 mg Intravitreal  . aflibercept (EYLEA) SOLN 2 mg Intravitreal  . aflibercept (EYLEA) SOLN 2 mg Intravitreal  . aflibercept (EYLEA) SOLN 2 mg Intravitreal  . aflibercept (EYLEA) SOLN 2 mg Intravitreal  . aflibercept (EYLEA) SOLN 2 mg Intravitreal  . aflibercept (EYLEA) SOLN 2 mg Intravitreal  . aflibercept (EYLEA) SOLN 2 mg Intravitreal  . aflibercept (EYLEA) SOLN 2 mg Intravitreal  . aflibercept (EYLEA) SOLN 2 mg Intravitreal  . aflibercept (EYLEA) SOLN 2 mg Intravitreal  . aflibercept (EYLEA) SOLN 2 mg Intravitreal   . aflibercept (EYLEA) SOLN 2 mg Intravitreal  . aflibercept (EYLEA) SOLN 2 mg Intravitreal  . aflibercept (EYLEA) SOLN 2 mg Intravitreal  . aflibercept (EYLEA) SOLN 2 mg Intravitreal   Current Outpatient Medications (Other)  Medication Sig  . alendronate (FOSAMAX) 70 MG tablet Take 1 tablet (70 mg total) by mouth every 7 (seven) days. Take with a full glass of water on an empty stomach.  Marland Kitchen atorvastatin (LIPITOR) 20 MG tablet Take 1 tablet (20 mg total) by mouth daily.  . cholecalciferol (VITAMIN D3) 25 MCG (1000 UT) tablet Take 1,000 Units by mouth daily.  . diclofenac sodium (VOLTAREN) 1 % GEL Apply 4 g topically 4 (four) times daily. (Patient taking differently: Apply 4 g topically 2 (two) times daily. )  . ferrous sulfate 325 (65 FE) MG EC tablet Take 1 tablet (325 mg total) by mouth 2 (two) times daily with a meal.  . losartan-hydrochlorothiazide (HYZAAR) 100-25 MG tablet Take one (1) by mouth daily.   Current Facility-Administered Medications (Other)  Medication Route  . Bevacizumab (AVASTIN) SOLN 1.25 mg Intravitreal  . Bevacizumab (AVASTIN) SOLN 1.25 mg Intravitreal      REVIEW OF SYSTEMS: ROS    Positive for: Eyes   Negative for: Constitutional, Gastrointestinal, Neurological, Skin, Genitourinary, Musculoskeletal, HENT, Endocrine, Cardiovascular, Respiratory, Psychiatric, Allergic/Imm, Heme/Lymph   Last edited by Zenovia Jordan, LPN on 32/35/5732  2:02 AM. (History)  ALLERGIES Allergies  Allergen Reactions  . Lisinopril Cough    PAST MEDICAL HISTORY Past Medical History:  Diagnosis Date  . Cataract    OU  . H/O cesarean section   . Hx of tonsillectomy   . Hyperlipidemia   . Hypertension   . Macular degeneration    Exu ARMD OU   Past Surgical History:  Procedure Laterality Date  . CARPAL TUNNEL RELEASE Right   . CESAREAN SECTION    . TONSILLECTOMY      FAMILY HISTORY Family History  Problem Relation Age of Onset  . Macular degeneration Mother    . Stroke Mother   . Hypertension Mother   . Dementia Mother   . Cancer Father        lung  . Diabetes Sister   . Hypertension Sister     SOCIAL HISTORY Social History   Tobacco Use  . Smoking status: Never Smoker  . Smokeless tobacco: Never Used  Substance Use Topics  . Alcohol use: No    Frequency: Never  . Drug use: No         OPHTHALMIC EXAM:  Base Eye Exam    Visual Acuity (Snellen - Linear)      Right Left   Dist cc 20/150 20/100   Dist ph cc NI NI   Correction: Glasses       Tonometry (Tonopen, 8:43 AM)      Right Left   Pressure 14 16       Pupils      Dark Light Shape React APD   Right 3 2 Round Brisk None   Left 3 2 Round Brisk None       Visual Fields (Counting fingers)      Left Right    Full Full       Extraocular Movement      Right Left    Full, Ortho Full, Ortho       Neuro/Psych    Oriented x3: Yes   Mood/Affect: Normal       Dilation    Both eyes: 1.0% Mydriacyl, 2.5% Phenylephrine @ 8:43 AM        Slit Lamp and Fundus Exam    Slit Lamp Exam      Right Left   Lids/Lashes Dermatochalasis - upper lid Dermatochalasis - upper lid   Conjunctiva/Sclera White and quiet White and quiet   Cornea Arcus, 1-2+ Punctate epithelial erosions Arcus, 1-2+ Punctate epithelial erosions   Anterior Chamber Deep and quiet Deep and quiet   Iris Round and well dilated Round and well dilated   Lens 3+ Nuclear sclerosis, 3+ Cortical cataract, trace Posterior subcapsular cataract centrally 2-3+ Nuclear sclerosis, 2+ Cortical cataract, trace Posterior subcapsular cataract centrally   Vitreous Vitreous syneresis, Posterior vitreous detachment Vitreous syneresis, Posterior vitreous detachment       Fundus Exam      Right Left   Disc pink and sharp, compact pink and sharp, Compact   C/D Ratio 0.2 0.2   Macula Blunted foveal reflex, Drusen, Pigment clumping and atrophy, central thickening/disciform scar, +PED, no heme Blunted foveal reflex, central  thickening, RPE clumping and atrophy, Drusen, RPE rip, stably improved IRF/SRF overlying PED   Vessels Vascular attenuation, Tortuous Vascular attenuation, Tortuous   Periphery Attached, mild Reticular degeneration Attached, mild Reticular degeneration          IMAGING AND PROCEDURES  Imaging and Procedures for 03/21/18  OCT, Retina - OU - Both Eyes  Right Eye Quality was good. Central Foveal Thickness: 529. Progression has been stable. Findings include pigment epithelial detachment, epiretinal membrane, subretinal hyper-reflective material, retinal drusen , abnormal foveal contour, no SRF, intraretinal fluid, disciform scar, outer retinal tubulation (Stable sub-retinal scar -- stable, trace persistent cystic changes overlying sub-retinal scar).   Left Eye Quality was good. Central Foveal Thickness: 352. Progression has been stable. Findings include subretinal hyper-reflective material, pigment epithelial detachment, intraretinal fluid, retinal drusen , abnormal foveal contour, no SRF, outer retinal atrophy (persistent IRF overlying SRHM/PED).   Notes *Images captured and stored on drive  Diagnosis / Impression:  Exudative ARMD OU OD: Stable sub-retinal scar, trace persistent cystic changes overlying sub-retinal scar OS: mild persistent IRF overlying SRHM/PED  Clinical management:  See below  Abbreviations: NFP - Normal foveal profile. CME - cystoid macular edema. PED - pigment epithelial detachment. IRF - intraretinal fluid. SRF - subretinal fluid. EZ - ellipsoid zone. ERM - epiretinal membrane. ORA - outer retinal atrophy. ORT - outer retinal tubulation. SRHM - subretinal hyper-reflective material        Intravitreal Injection, Pharmacologic Agent - OS - Left Eye       Time Out 10/03/2019. 8:49 AM. Confirmed correct patient, procedure, site, and patient consented.   Anesthesia Topical anesthesia was used. Anesthetic medications included Lidocaine 2%, Proparacaine  0.5%.   Procedure Preparation included 5% betadine to ocular surface, eyelid speculum. A 30 gauge needle was used.   Injection:  2 mg aflibercept Alfonse Flavors) SOLN   NDC: A3590391, Lot: [id number], Expiration date: 03/24/2020   Route: Intravitreal, Site: Left Eye, Waste: 0.05 mL  Post-op Post injection exam found visual acuity of at least counting fingers. The patient tolerated the procedure well. There were no complications. The patient received written and verbal post procedure care education.        Intravitreal Injection, Pharmacologic Agent - OD - Right Eye       Time Out 10/03/2019. 9:53 AM. Confirmed correct patient, procedure, site, and patient consented.   Anesthesia Topical anesthesia was used. Anesthetic medications included Lidocaine 2%, Proparacaine 0.5%.   Procedure Preparation included 5% betadine to ocular surface, eyelid speculum. A 30 gauge needle was used.   Injection:  2 mg aflibercept Alfonse Flavors) SOLN   NDC: A3590391, Lot: [id number], Expiration date: 04/30/2020   Route: Intravitreal, Site: Right Eye, Waste: 0.05 mL  Post-op Post injection exam found visual acuity of at least counting fingers. The patient tolerated the procedure well. There were no complications. The patient received written and verbal post procedure care education.                 ASSESSMENT/PLAN:    ICD-10-CM   1. Exudative age-related macular degeneration of both eyes with active choroidal neovascularization (HCC)  H35.3231 Intravitreal Injection, Pharmacologic Agent - OS - Left Eye    Intravitreal Injection, Pharmacologic Agent - OD - Right Eye    aflibercept (EYLEA) SOLN 2 mg    aflibercept (EYLEA) SOLN 2 mg  2. Retinal edema  H35.81 OCT, Retina - OU - Both Eyes  3. Posterior vitreous detachment of both eyes  H43.813   4. Combined form of age-related cataract, both eyes  H25.813   5. Ocular hypertension, bilateral  H40.053     1,2. Exudative age related macular  degeneration, both eyes.    - severe exudative disease with very active CNVM OU at presentation in January 2019  - S/P IVA OD #1 (01.04.19), #2 (02.15.19)  - S/P IVA OS #1 (01.18.19), #2 (  02.15.19)  - switched to Eylea 3.18.19 due to severity of disease  - S/P IVE OD #1 (03.18.19), #2 (04.17.19), #3 (05.15.19) -- injections held due to stable disciform scar, #4 (10.02.19) -- now on a q3-4 month interval schedule, #5 (01.15.20), #6 (06.04.20)  - S/P IVE OS #1 (03.18.19), #2 (04.17.19), #3 (05.15.19), #4 (06.12.19), #5 (07.10.19), #6 (08.07.19), #7 (09.04.19), #8 (10.02.19), #9 (11.06.19), #10 (12.11.19), #11 (01.15.20), #12 (02.20.20), #13 (03.26.20), #14 (04.30.20), #15 (06.04.20), #16 (07.16.20), # 17 (08.20.20), #18 (09.24.20)  - OCT OS today shows tr persistent IRF overlying PED  - OD with significant subretinal disciform scar - trace improvement in cystic changes  - will plan to treat OD ~q3-5 mos for maintenance/prevention -- last injection 06.04.20  - recommend IVE OU (OD #7, OS #19), today, 10.29.20 -- maintenance  - pt wishes to be treated with IVE  - RBA of procedure discussed, questions answered  - informed consent obtained and signed  - see procedure note  - Eylea4U paperwork filled out on 02.15.19 and fully approved through [employer] -- renewed 12/2018  - f/u in 5 wks for DFE/OCT/possible injection OS -- consider tx and extend OS following cataract surgery  3. PVD / vitreous syneresis  - Discussed findings and prognosis  - No RT or RD on 360  exam  - Reviewed s/s of RT/RD  - Strict return precautions for any such RT/RD signs/symptoms  4. Combined form of age related cataract OU-  - The symptoms of cataract, surgical options, and treatments and risks were discussed with patient.  - discussed diagnosis and progression  - approaching visual significance, but long discussion about limitations of cataract surgery due to presence of ARMD OU  - OS surgery scheduled for October 11, 2019 with Dr. Marisa Hua    5. Ocular Hypertension OU  - IOP okay today -- 14 OD, 16 OS  - cont Cosopt bid OU   Ophthalmic Meds Ordered this visit:  Meds ordered this encounter  Medications  . aflibercept (EYLEA) SOLN 2 mg  . aflibercept (EYLEA) SOLN 2 mg       Return in about 5 weeks (around 11/07/2019) for f/u exu ARMD OU, DFE, OCT.  There are no Patient Instructions on file for this visit.   Explained the diagnoses, plan, and follow up with the patient and they expressed understanding.  Patient expressed understanding of the importance of proper follow up care.    Electronically signed by: Leeann Must, [place] 10.26.2020 10:38AM  Gardiner Sleeper, M.D., Ph.D. Diseases & Surgery of the Retina and Vitreous [facility]  I have reviewed the above documentation for accuracy and completeness, and I agree with the above. Gardiner Sleeper, M.D., Ph.D. 10/03/19 2:28 PM   Abbreviations: M myopia (nearsighted); A astigmatism; H hyperopia (farsighted); P presbyopia; Mrx spectacle prescription;  CTL contact lenses; OD right eye; OS left eye; OU both eyes  XT exotropia; ET esotropia; PEK punctate epithelial keratitis; PEE punctate epithelial erosions; DES dry eye syndrome; MGD meibomian gland dysfunction; ATs artificial tears; PFAT's preservative free artificial tears; [place] nuclear sclerotic cataract; PSC posterior subcapsular cataract; ERM epi-retinal membrane; PVD posterior vitreous detachment; RD retinal detachment; DM diabetes mellitus; DR diabetic retinopathy; NPDR non-proliferative diabetic retinopathy; PDR proliferative diabetic retinopathy; CSME clinically significant macular edema; DME diabetic macular edema; dbh dot blot hemorrhages; CWS cotton wool spot; POAG primary open angle glaucoma; C/D cup-to-disc ratio; HVF humphrey visual field; GVF goldmann visual field; OCT optical coherence tomography; IOP  intraocular pressure; BRVO Branch retinal vein occlusion; CRVO  central retinal vein occlusion; CRAO central retinal artery occlusion; BRAO branch retinal artery occlusion; RT retinal tear; SB scleral buckle; PPV pars plana vitrectomy; VH Vitreous hemorrhage; PRP panretinal laser photocoagulation; IVK intravitreal kenalog; VMT vitreomacular traction; MH Macular hole;  NVD neovascularization of the disc; NVE neovascularization elsewhere; AREDS age related eye disease study; ARMD age related macular degeneration; POAG primary open angle glaucoma; EBMD epithelial/anterior basement membrane dystrophy; ACIOL anterior chamber intraocular lens; IOL intraocular lens; PCIOL posterior chamber intraocular lens; Phaco/IOL phacoemulsification with intraocular lens placement; [place] photorefractive keratectomy; LASIK laser assisted in situ keratomileusis; HTN hypertension; DM diabetes mellitus; COPD chronic obstructive pulmonary disease

## 2019-10-01 ENCOUNTER — Other Ambulatory Visit: Payer: Self-pay | Admitting: Family Medicine

## 2019-10-01 ENCOUNTER — Ambulatory Visit (INDEPENDENT_AMBULATORY_CARE_PROVIDER_SITE_OTHER): Payer: Medicare HMO | Admitting: *Deleted

## 2019-10-01 DIAGNOSIS — D649 Anemia, unspecified: Secondary | ICD-10-CM

## 2019-10-01 DIAGNOSIS — N183 Chronic kidney disease, stage 3 unspecified: Secondary | ICD-10-CM | POA: Diagnosis not present

## 2019-10-01 NOTE — Chronic Care Management (AMB) (Signed)
  Chronic Care Management   Follow Up Note   10/01/2019 Name: Gabrielle [name] MRN: [id number] DOB: [birth year]  Referred by: Janora Norlander, DO Reason for referral : Chronic Care Management (RN Follow up)   Gabrielle [name] is a 73 y.o. year old female who is a primary care patient of Janora Norlander, DO. The CCM team was consulted for assistance with chronic disease management and care coordination needs.    Review of patient status, including review of consultants reports, relevant laboratory and other test results, and collaboration with appropriate care team members and the patient's provider was performed as part of comprehensive patient evaluation and provision of chronic care management services.     I spoke with Gabrielle [name] by telephone today. She is scheduled for cataract removal on 10/11/2019 and I am concerned that her severe anemia from July has not been followed up on. I have collaborated with PCP office, eye surgeon's office, and [facility] Short [facility].   Goals Addressed            This Visit's Progress     Patient Stated   . Anemia Management (pt-stated)       Current Barriers:  . Chronic Disease Management support and education needs related to anemia  Nurse Case Manager Clinical Goal(s):  Marland Kitchen Over the next 30 days, patient will meet with RN Care Manager to address anemia . Over the next 30 days, patient will see GI regarding anemia . Over the next 30 days, patient will see nephrology regarding anemia   Interventions:  . Chart reviewed, including relevant office notes and lab reports . Patient reports being asymptomatic  . Patient is scheduled for cataract removal on 11/6 and pre-op bloodwork on 11/4 . Referral to GI placed in July. No updated information available and patient has not seen GI . Referral to Nephrology placed in July. Notes in proficient stated that the referral was closed because they were unable to reach the patient. Patient does not recall  being contacted. . Encouraged to take iron as instructed . RN will collaborate with referral coordinator to schedule these appointments . Consulted with [facility] [phone]. Nurse advised that they would likely postpone surgery if patient is still severely anemic . Patient will need to return [facility] for labwork within the next few days. RN will collaborate with PCP to order these labs.  . RN will coordinate care with surgical center once lab results are in  Patient [place]:  . Performs ADL's independently . Performs IADL's independently . Unable to independently N/A  Initial goal documentation        Follow-up Plan  Patient to come in for CBC & BMP within the next 3 days. Orders placed.   RN will coordinate care with surgical center once results are back  RN will collaborate with PCP referral coordinator to schedule GI and nephrology appts  HIPPA compliant voicemail left for patient to return my call regarding need for lab tests  RN will f/u with patient by telephone within the next 24 hours if no return call received   Chong Sicilian, BSN, RN-BC [place] / [place] Management Direct Dial: [phone]

## 2019-10-01 NOTE — H&P (Signed)
Surgical History & Physical  Patient Name: Gabrielle [name] DOB: 11/06/1946  Surgery: Cataract extraction with intraocular lens implant phacoemulsification; Left Eye  Surgeon: Baruch Goldmann MD Surgery Date:  10/11/2019 Pre-Op Date:  09/23/2019  HPI: A 53 Yr. old female patient is referred by Dr Coralyn Pear, F/U for Exudative AMD/Cataract. Pt is getting IV inj OU, last 4 weeks ago OS. 1. 1. The patient complains of difficulty when viewing TV, reading closed caption, news scrolls on TV, which began 3 years ago. Both eyes are affected. The episode is gradual. The condition's severity increased since last visit. Symptoms occur when the patient is inside, outside and reading. This is negatively affecting the patient's quality of life. Pt denies floaters, eye discomfort. HPI was performed by Baruch Goldmann .  Medical History: Macula Degeneration Cataracts Arthritis High Blood Pressure LDL  Review of Systems Negative Allergic/Immunologic Negative Cardiovascular Negative Constitutional Negative Ear, Nose, Mouth & Throat Negative Endocrine Negative Eyes Negative Gastrointestinal Negative Genitourinary Negative Hemotologic/Lymphatic Negative Integumentary Negative Musculoskeletal Negative Neurological Negative Psychiatry Negative Respiratory  Social   Never smoked  Medication Dorzolamide, Timolol,  Losartan Potassium, Iron, Atorvastatin, Alendronate,   Sx/Procedures  None  Drug Allergies   NKDA  History & Physical: [place]:  Cataract, Left eye NECK: supple without bruits LUNGS: lungs clear to auscultation CV: regular rate and rhythm Abdomen: soft and non-tender Impression & Plan: Assessment: 1.  COMBINED FORMS AGE RELATED CATARACT; Both Eyes (H25.813) 2.  AGE-RELATED MACULAR DEGENERATION WET; Both Eyes Active neovascularization (H35.3231)  Plan: 1.  Cataract accounts for the patient's decreased vision. This visual impairment is not correctable with a tolerable change in glasses  or contact lenses. Cataract surgery with an implantation of a new lens should significantly improve the visual and functional status of the patient. Discussed all risks, benefits, alternatives, and potential complications. Discussed the procedures and recovery. Patient desires to have surgery. A-scan ordered and performed today for intra-ocular lens calculations. The surgery will be performed in order to improve vision for driving, reading, and for eye examinations. Recommend phacoemulsification with intra-ocular lens. Left Eye is functional eye - first. Dilates well - shugarcaine by protocol. macular degeneration will limit final vision. 2.  Under the care of Dr. Coralyn Pear (retina).

## 2019-10-01 NOTE — Patient Instructions (Signed)
Visit Information  Goals Addressed            This Visit's Progress     Patient Stated   . Anemia Management (pt-stated)       Current Barriers:  . Chronic Disease Management support and education needs related to anemia  Nurse Case Manager Clinical Goal(s):  Marland Kitchen Over the next 30 days, patient will meet with RN Care Manager to address anemia . Over the next 30 days, patient will see GI regarding anemia . Over the next 30 days, patient will see nephrology regarding anemia   Interventions:  . Chart reviewed, including relevant office notes and lab reports . Patient reports being asymptomatic  . Patient is scheduled for cataract removal on 11/6 and pre-op bloodwork on 11/4 . Referral to GI placed in July. No updated information available and patient has not seen GI . Referral to Nephrology placed in July. Notes in proficient stated that the referral was closed because they were unable to reach the patient. Patient does not recall being contacted. . Encouraged to take iron as instructed . RN will collaborate with referral coordinator to schedule these appointments . Consulted with [facility] [phone]. Nurse advised that they would likely postpone surgery if patient is still severely anemic . Patient will need to return to [facility] for labwork within the next few days. RN will collaborate with PCP to order these labs.  . RN will coordinate care with surgical center once lab results are in  Patient [place]:  . Performs ADL's independently . Performs IADL's independently . Unable to independently N/A  Initial goal documentation        The patient verbalized understanding of instructions provided today and declined a print copy of patient instruction materials.   Follow-up Plan  Patient to come in for CBC & BMP within the next 3 days. Orders placed.   RN will coordinate care with surgical center once results are back  RN will collaborate with PCP  referral coordinator to schedule GI and nephrology appts  HIPPA compliant voicemail left for patient to return my call regarding need for lab tests  RN will f/u with patient by telephone within the next 24 hours if no return call received   Chong Sicilian, BSN, RN-BC [place] / [place] Management Direct Dial: [phone]

## 2019-10-02 ENCOUNTER — Telehealth: Payer: Medicare HMO | Admitting: *Deleted

## 2019-10-02 ENCOUNTER — Other Ambulatory Visit: Payer: Self-pay

## 2019-10-02 ENCOUNTER — Other Ambulatory Visit: Payer: Medicare HMO

## 2019-10-02 DIAGNOSIS — D649 Anemia, unspecified: Secondary | ICD-10-CM

## 2019-10-02 DIAGNOSIS — N183 Chronic kidney disease, stage 3 unspecified: Secondary | ICD-10-CM

## 2019-10-02 LAB — CBC WITH DIFFERENTIAL/PLATELET
Basophils Absolute: 0 10*3/uL (ref 0.0–0.2)
Basos: 0 %
EOS (ABSOLUTE): 0.2 10*3/uL (ref 0.0–0.4)
Eos: 3 %
Hematocrit: 29.7 % — ABNORMAL LOW (ref 34.0–46.6)
Hemoglobin: 9.2 g/dL — ABNORMAL LOW (ref 11.1–15.9)
Immature Grans (Abs): 0 10*3/uL (ref 0.0–0.1)
Immature Granulocytes: 0 %
Lymphocytes Absolute: 1.5 10*3/uL (ref 0.7–3.1)
Lymphs: 17 %
MCH: 26.1 pg — ABNORMAL LOW (ref 26.6–33.0)
MCHC: 31 g/dL — ABNORMAL LOW (ref 31.5–35.7)
MCV: 84 fL (ref 79–97)
Monocytes Absolute: 0.7 10*3/uL (ref 0.1–0.9)
Monocytes: 8 %
Neutrophils Absolute: 6 10*3/uL (ref 1.4–7.0)
Neutrophils: 72 %
Platelets: 221 10*3/uL (ref 150–450)
RBC: 3.52 x10E6/uL — ABNORMAL LOW (ref 3.77–5.28)
RDW: 14.8 % (ref 11.7–15.4)
WBC: 8.4 10*3/uL (ref 3.4–10.8)

## 2019-10-02 LAB — BMP8+EGFR
BUN/Creatinine Ratio: 19 (ref 12–28)
BUN: 24 mg/dL (ref 8–27)
CO2: 27 mmol/L (ref 20–29)
Calcium: 9.4 mg/dL (ref 8.7–10.3)
Chloride: 98 mmol/L (ref 96–106)
Creatinine, Ser: 1.28 mg/dL — ABNORMAL HIGH (ref 0.57–1.00)
GFR calc Af Amer: 48 mL/min/{1.73_m2} — ABNORMAL LOW (ref >59)
GFR calc non Af Amer: 42 mL/min/{1.73_m2} — ABNORMAL LOW (ref >59)
Glucose: 104 mg/dL — ABNORMAL HIGH (ref 65–99)
Potassium: 3.5 mmol/L (ref 3.5–5.2)
Sodium: 139 mmol/L (ref 134–144)

## 2019-10-03 ENCOUNTER — Encounter (INDEPENDENT_AMBULATORY_CARE_PROVIDER_SITE_OTHER): Payer: Self-pay | Admitting: Ophthalmology

## 2019-10-03 ENCOUNTER — Encounter (INDEPENDENT_AMBULATORY_CARE_PROVIDER_SITE_OTHER): Payer: Medicare HMO | Admitting: Ophthalmology

## 2019-10-03 ENCOUNTER — Ambulatory Visit (INDEPENDENT_AMBULATORY_CARE_PROVIDER_SITE_OTHER): Payer: Medicare HMO | Admitting: Ophthalmology

## 2019-10-03 DIAGNOSIS — H353231 Exudative age-related macular degeneration, bilateral, with active choroidal neovascularization: Secondary | ICD-10-CM

## 2019-10-03 DIAGNOSIS — H40053 Ocular hypertension, bilateral: Secondary | ICD-10-CM | POA: Diagnosis not present

## 2019-10-03 DIAGNOSIS — H25813 Combined forms of age-related cataract, bilateral: Secondary | ICD-10-CM | POA: Diagnosis not present

## 2019-10-03 DIAGNOSIS — H43813 Vitreous degeneration, bilateral: Secondary | ICD-10-CM

## 2019-10-03 DIAGNOSIS — H3581 Retinal edema: Secondary | ICD-10-CM | POA: Diagnosis not present

## 2019-10-03 MED ORDER — AFLIBERCEPT 2MG/0.05ML IZ SOLN FOR KALEIDOSCOPE
2.0000 mg | INTRAVITREAL | Status: AC | PRN
  Administered 2019-10-03: 2 mg via INTRAVITREAL

## 2019-10-04 ENCOUNTER — Telehealth: Payer: Self-pay | Admitting: Family Medicine

## 2019-10-04 ENCOUNTER — Encounter: Payer: Self-pay | Admitting: Gastroenterology

## 2019-10-07 DIAGNOSIS — H25812 Combined forms of age-related cataract, left eye: Secondary | ICD-10-CM | POA: Diagnosis not present

## 2019-10-07 NOTE — Telephone Encounter (Signed)
Refer to labs  °

## 2019-10-09 ENCOUNTER — Other Ambulatory Visit (HOSPITAL_COMMUNITY)
Admission: RE | Admit: 2019-10-09 | Discharge: 2019-10-09 | Disposition: A | Discharge status: Home / Self Care | Payer: Medicare HMO | Source: Ambulatory Visit | Attending: Ophthalmology | Admitting: Ophthalmology

## 2019-10-09 ENCOUNTER — Other Ambulatory Visit: Payer: Self-pay

## 2019-10-09 ENCOUNTER — Encounter (HOSPITAL_COMMUNITY)
Admission: RE | Admit: 2019-10-09 | Discharge: 2019-10-09 | Disposition: A | Discharge status: Home / Self Care | Payer: Medicare HMO | Source: Ambulatory Visit | Attending: Ophthalmology | Admitting: Ophthalmology

## 2019-10-09 DIAGNOSIS — Z01812 Encounter for preprocedural laboratory examination: Secondary | ICD-10-CM | POA: Diagnosis not present

## 2019-10-09 DIAGNOSIS — Z20828 Contact with and (suspected) exposure to other viral communicable diseases: Secondary | ICD-10-CM | POA: Insufficient documentation

## 2019-10-09 LAB — SARS CORONAVIRUS 2 (TAT 6-24 HRS): SARS Coronavirus 2: NEGATIVE

## 2019-10-11 ENCOUNTER — Encounter (HOSPITAL_COMMUNITY)
Admission: RE | Disposition: A | Discharge status: Home / Self Care | Payer: Self-pay | Source: Home / Self Care | Attending: Ophthalmology

## 2019-10-11 ENCOUNTER — Ambulatory Visit (HOSPITAL_COMMUNITY)
Admission: RE | Admit: 2019-10-11 | Discharge: 2019-10-11 | Disposition: A | Discharge status: Home / Self Care | Payer: Medicare HMO | Attending: Ophthalmology | Admitting: Ophthalmology

## 2019-10-11 ENCOUNTER — Ambulatory Visit (HOSPITAL_COMMUNITY): Payer: Medicare HMO | Admitting: Anesthesiology

## 2019-10-11 ENCOUNTER — Encounter (HOSPITAL_COMMUNITY): Payer: Self-pay | Admitting: Anesthesiology

## 2019-10-11 DIAGNOSIS — H25813 Combined forms of age-related cataract, bilateral: Secondary | ICD-10-CM | POA: Diagnosis not present

## 2019-10-11 DIAGNOSIS — I10 Essential (primary) hypertension: Secondary | ICD-10-CM | POA: Insufficient documentation

## 2019-10-11 DIAGNOSIS — Z79899 Other long term (current) drug therapy: Secondary | ICD-10-CM | POA: Diagnosis not present

## 2019-10-11 DIAGNOSIS — H353230 Exudative age-related macular degeneration, bilateral, stage unspecified: Secondary | ICD-10-CM | POA: Insufficient documentation

## 2019-10-11 DIAGNOSIS — I129 Hypertensive chronic kidney disease with stage 1 through stage 4 chronic kidney disease, or unspecified chronic kidney disease: Secondary | ICD-10-CM | POA: Diagnosis not present

## 2019-10-11 DIAGNOSIS — H25812 Combined forms of age-related cataract, left eye: Secondary | ICD-10-CM | POA: Diagnosis not present

## 2019-10-11 DIAGNOSIS — N189 Chronic kidney disease, unspecified: Secondary | ICD-10-CM | POA: Diagnosis not present

## 2019-10-11 HISTORY — PX: CATARACT EXTRACTION W/PHACO: SHX586

## 2019-10-11 SURGERY — PHACOEMULSIFICATION, CATARACT, WITH IOL INSERTION
Anesthesia: Monitor Anesthesia Care | Site: Eye | Laterality: Left

## 2019-10-11 MED ORDER — PROVISC 10 MG/ML IO SOLN
INTRAOCULAR | Status: DC | PRN
  Administered 2019-10-11: 0.85 mL via INTRAOCULAR

## 2019-10-11 MED ORDER — SODIUM HYALURONATE 23 MG/ML IO SOLN
INTRAOCULAR | Status: DC | PRN
  Administered 2019-10-11: 0.6 mL via INTRAOCULAR

## 2019-10-11 MED ORDER — POVIDONE-IODINE 5 % OP SOLN
OPHTHALMIC | Status: DC | PRN
  Administered 2019-10-11: 1 via OPHTHALMIC

## 2019-10-11 MED ORDER — EPINEPHRINE PF 1 MG/ML IJ SOLN
INTRAMUSCULAR | Status: AC
  Filled 2019-10-11: qty 2

## 2019-10-11 MED ORDER — EPINEPHRINE PF 1 MG/ML IJ SOLN
INTRAOCULAR | Status: DC | PRN
  Administered 2019-10-11: 500 mL

## 2019-10-11 MED ORDER — CYCLOPENTOLATE-PHENYLEPHRINE 0.2-1 % OP SOLN
1.0000 [drp] | OPHTHALMIC | Status: AC | PRN
  Administered 2019-10-11 (×3): 1 [drp] via OPHTHALMIC

## 2019-10-11 MED ORDER — PHENYLEPHRINE HCL 2.5 % OP SOLN
1.0000 [drp] | OPHTHALMIC | Status: AC | PRN
  Administered 2019-10-11 (×3): 1 [drp] via OPHTHALMIC

## 2019-10-11 MED ORDER — LIDOCAINE HCL (PF) 1 % IJ SOLN
INTRAOCULAR | Status: DC | PRN
  Administered 2019-10-11: 1 mL via OPHTHALMIC

## 2019-10-11 MED ORDER — LIDOCAINE HCL 3.5 % OP GEL
1.0000 "application " | Freq: Once | OPHTHALMIC | Status: AC
  Administered 2019-10-11: 1 via OPHTHALMIC

## 2019-10-11 MED ORDER — BSS IO SOLN
INTRAOCULAR | Status: DC | PRN
  Administered 2019-10-11: 15 mL via INTRAOCULAR

## 2019-10-11 MED ORDER — NEOMYCIN-POLYMYXIN-DEXAMETH 3.5-10000-0.1 OP SUSP
OPHTHALMIC | Status: DC | PRN
  Administered 2019-10-11: 2 [drp] via OPHTHALMIC

## 2019-10-11 MED ORDER — TETRACAINE HCL 0.5 % OP SOLN
1.0000 [drp] | OPHTHALMIC | Status: AC | PRN
  Administered 2019-10-11 (×3): 1 [drp] via OPHTHALMIC

## 2019-10-11 SURGICAL SUPPLY — 13 items

## 2019-10-11 NOTE — Anesthesia Preprocedure Evaluation (Signed)
Anesthesia Evaluation  Patient identified by MRN, date of birth, ID band Patient awake    Reviewed: Allergy & Precautions, NPO status , Patient's Chart, lab work & pertinent test results  Airway Mallampati: III  TM Distance: >3 FB Neck ROM: Full  Mouth opening: Limited Mouth Opening  Dental no notable dental hx. (+) Teeth Intact   Pulmonary neg pulmonary ROS,    Pulmonary exam normal breath sounds clear to auscultation       Cardiovascular Exercise Tolerance: Good hypertension, Pt. on medications negative cardio ROS Normal cardiovascular examI Rhythm:Regular Rate:Normal     Neuro/Psych negative neurological ROS  negative psych ROS   GI/Hepatic negative GI ROS, Neg liver ROS,   Endo/Other  negative endocrine ROS  Renal/GU Renal InsufficiencyRenal disease  negative genitourinary   Musculoskeletal negative musculoskeletal ROS (+)   Abdominal   Peds negative pediatric ROS (+)  Hematology negative hematology ROS (+) anemia ,   Anesthesia Other Findings   Reproductive/Obstetrics negative OB ROS                             Anesthesia Physical Anesthesia Plan  ASA: III  Anesthesia Plan: MAC   Post-op Pain Management:    Induction: Intravenous  PONV Risk Score and Plan: 2 and TIVA, Ondansetron and Treatment may vary due to age or medical condition  Airway Management Planned: Simple Face Mask and Nasal Cannula  Additional Equipment:   Intra-op Plan:   Post-operative Plan:   Informed Consent: I have reviewed the patients History and Physical, chart, labs and discussed the procedure including the risks, benefits and alternatives for the proposed anesthesia with the patient or authorized representative who has indicated his/her understanding and acceptance.     Dental advisory given  Plan Discussed with: CRNA  Anesthesia Plan Comments: (Plan Full PPE use  Plan MAC-WTP with same  after Q&A)        Anesthesia Quick Evaluation

## 2019-10-11 NOTE — Discharge Instructions (Signed)
Please discharge patient when stable, will follow up today with Dr. Azelea Seguin at the [facility] office immediately following discharge.  Leave shield in place until visit.  All paperwork with discharge instructions will be given at the office. ° °

## 2019-10-11 NOTE — Op Note (Signed)
Date of procedure: 10/11/19  Pre-operative diagnosis: Visually significant age-related combined cataract, Left Eye (H25.812)  Post-operative diagnosis: Visually significant age-related combined cataract, Left Eye (H25.812)  Procedure: Removal of cataract via phacoemulsification and insertion of intra-ocular lens [employer]  +14.5D into the capsular bag of the Left Eye  Attending surgeon: Gerda Diss. Henson Fraticelli, MD, MA  Anesthesia: MAC, Topical Akten  Complications: None  Estimated Blood Loss: <20m (minimal)  Specimens: None  Implants: As above  Indications:  Visually significant age-related cataract, Left Eye  Procedure:  The patient was seen and identified in the pre-operative area. The operative eye was identified and dilated.  The operative eye was marked.  Topical anesthesia was administered to the operative eye.     The patient was then to the operative suite and placed in the supine position.  A timeout was performed confirming the patient, procedure to be performed, and all other relevant information.   The patient's face was prepped and draped in the usual fashion for intra-ocular surgery.  A lid speculum was placed into the operative eye and the surgical microscope moved into place and focused.  An inferotemporal paracentesis was created using a 20 gauge paracentesis blade.  Shugarcaine was injected into the anterior chamber.  Viscoelastic was injected into the anterior chamber.  A temporal clear-corneal main wound incision was created using a 2.429mmicrokeratome.  A continuous curvilinear capsulorrhexis was initiated using an irrigating cystitome and completed using capsulorrhexis forceps.  Hydrodissection and hydrodeliniation were performed.  Viscoelastic was injected into the anterior chamber.  A phacoemulsification handpiece and a chopper as a second instrument were used to remove the nucleus and epinucleus. The irrigation/aspiration handpiece was used to remove  any remaining cortical material.   The capsular bag was reinflated with viscoelastic, checked, and found to be intact.  The intraocular lens was inserted into the capsular bag.  The irrigation/aspiration handpiece was used to remove any remaining viscoelastic.  The clear corneal wound and paracentesis wounds were then hydrated and checked with Weck-Cels to be watertight.  The lid-speculum and drape was removed, and the patient's face was cleaned with a wet and dry 4x4.  Maxitrol was instilled in the eye before a clear shield was taped over the eye. The patient was taken to the post-operative care unit in good condition, having tolerated the procedure well.  Post-Op Instructions: [facility] and will receive all other orders and instructions.

## 2019-10-11 NOTE — Interval H&P Note (Signed)
History and Physical Interval Note: The H and P was reviewed and updated. The patient was examined.  No changes were found after exam.  The surgical eye was marked.  10/11/2019 11:32 AM  Gabrielle [name]  has presented today for surgery, with the diagnosis of Nuclear sclerotic cataract - Left eye.  The various methods of treatment have been discussed with the patient and family. After consideration of risks, benefits and other options for treatment, the patient has consented to  Procedure(s) with comments: CATARACT EXTRACTION PHACO AND INTRAOCULAR LENS PLACEMENT ([facility]) (Left) - CDE:  as a surgical intervention.  The patient's history has been reviewed, patient examined, no change in status, stable for surgery.  I have reviewed the patient's chart and labs.  Questions were answered to the patient's satisfaction.     Baruch Goldmann

## 2019-10-11 NOTE — Transfer of Care (Signed)
Immediate Anesthesia Transfer of Care Note  Patient: Gabrielle [name]  Procedure(s) Performed: CATARACT EXTRACTION PHACO AND INTRAOCULAR LENS PLACEMENT ([facility]) (Left Eye)  Patient Location: Short Stay  Anesthesia Type:MAC  Level of Consciousness: awake, alert , oriented and patient cooperative  Airway & Oxygen Therapy: Patient Spontanous Breathing  Post-op Assessment: Report given to RN and Post -op Vital signs reviewed and stable  Post vital signs: Reviewed and stable  Last Vitals:  Vitals Value Taken Time  BP    Temp    Pulse    Resp    SpO2      Last Pain:  Vitals:   10/11/19 1045  PainSc: 0-No pain         Complications: No apparent anesthesia complications

## 2019-10-11 NOTE — Anesthesia Postprocedure Evaluation (Signed)
Anesthesia Post Note  Patient: Gabrielle [name]  Procedure(s) Performed: CATARACT EXTRACTION PHACO AND INTRAOCULAR LENS PLACEMENT ([place]) (Left Eye)  Patient location during evaluation: Short Stay Anesthesia Type: MAC Level of consciousness: awake and alert Pain management: pain level controlled Vital Signs Assessment: post-procedure vital signs reviewed and stable Respiratory status: spontaneous breathing Cardiovascular status: stable Postop Assessment: able to ambulate Anesthetic complications: no     Last Vitals:  Vitals:   10/11/19 1045  BP: (!) 163/71  Pulse: 67  Resp: 16  Temp: 36.6 C  SpO2: 98%    Last Pain:  Vitals:   10/11/19 1045  PainSc: 0-No pain                 Everette Rank

## 2019-10-14 ENCOUNTER — Encounter (HOSPITAL_COMMUNITY): Payer: Self-pay | Admitting: Ophthalmology

## 2019-10-16 ENCOUNTER — Ambulatory Visit: Payer: Medicare HMO | Admitting: *Deleted

## 2019-10-16 DIAGNOSIS — I10 Essential (primary) hypertension: Secondary | ICD-10-CM

## 2019-10-16 DIAGNOSIS — N183 Chronic kidney disease, stage 3 unspecified: Secondary | ICD-10-CM

## 2019-10-16 DIAGNOSIS — D649 Anemia, unspecified: Secondary | ICD-10-CM

## 2019-10-18 NOTE — H&P (Signed)
Surgical History & Physical  Patient Name: Gabrielle [name] DOB: 21-Mar-1946  Surgery: Cataract extraction with intraocular lens implant phacoemulsification; Right Eye  Surgeon: Baruch Goldmann MD Surgery Date:  10/25/2019 Pre-Op Date:  10/17/2019  HPI: A 80 Yr. old female patient 1. The patient is returning after cataract surgery. The left eye is affected. Status post cataract surgery on 10-11-2019: Since the last visit, the affected area feels improvement and is doing well.  The patient's vision is improved. Patient is following medication instructions with 3 in 1 drops TID. Patient also uses Cosopt BID OU as directed. Patient eager to proceed with OD due to clarity difference OU and VA much brighter OS. OD cloudy and dull c/w OS. HPI was performed by Baruch Goldmann .  Medical History: Macula Degeneration Glaucoma Cataracts Arthritis High Blood Pressure LDL  Review of Systems Negative Allergic/Immunologic Negative Cardiovascular Negative Constitutional Negative Ear, Nose, Mouth & Throat Negative Endocrine Negative Eyes Negative Gastrointestinal Negative Genitourinary Negative Hemotologic/Lymphatic Negative Integumentary Negative Musculoskeletal Negative Neurological Negative Psychiatry Negative Respiratory  Social   Never smoked  Medication Prednisolone-gatiflox-bromfenac, Cosopt,  Losartan Potassium, Iron, Atorvastatin, Alendronate,   Sx/Procedures Phaco c IOL,   Drug Allergies   NKDA  History & Physical: Heent:  Cataract, Right eye NECK: supple without bruits LUNGS: lungs clear to auscultation CV: regular rate and rhythm Abdomen: soft and non-tender  Impression & Plan: Assessment: 1.  COMBINED FORMS AGE RELATED CATARACT; Right Eye (H25.811) 2.  CATARACT EXTRACTION STATUS; Left Eye (Z98.42)  Plan: 1.  Cataract accounts for the patient's decreased vision. This visual impairment is not correctable with a tolerable change in glasses or contact lenses. Cataract  surgery with an implantation of a new lens should significantly improve the visual and functional status of the patient. Discussed all risks, benefits, alternatives, and potential complications. Discussed the procedures and recovery. Patient desires to have surgery. A-scan ordered and performed today for intra-ocular lens calculations. The surgery will be performed in order to improve vision for driving, reading, and for eye examinations. Recommend phacoemulsification with intra-ocular lens. Right Eye. Surgery required to correct imbalance of vision. Dilates well - shugarcaine by protocol. 2.  1 week after cataract surgery. Doing well with improved vision and normal eye pressure. Call with any problems or concerns. Continue Gati-Brom-Pred 2x/day for 3 more weeks.

## 2019-10-21 DIAGNOSIS — H25811 Combined forms of age-related cataract, right eye: Secondary | ICD-10-CM | POA: Diagnosis not present

## 2019-10-22 ENCOUNTER — Ambulatory Visit: Payer: Medicare HMO | Admitting: Gastroenterology

## 2019-10-23 ENCOUNTER — Other Ambulatory Visit: Payer: Self-pay

## 2019-10-23 ENCOUNTER — Other Ambulatory Visit (HOSPITAL_COMMUNITY)
Admit: 2019-10-23 | Discharge: 2019-10-23 | Disposition: A | Discharge status: Home / Self Care | Payer: Medicare HMO | Source: Ambulatory Visit | Attending: Ophthalmology | Admitting: Ophthalmology

## 2019-10-23 ENCOUNTER — Encounter (HOSPITAL_COMMUNITY)
Admit: 2019-10-23 | Discharge: 2019-10-23 | Disposition: A | Discharge status: Home / Self Care | Payer: Medicare HMO | Attending: Ophthalmology | Admitting: Ophthalmology

## 2019-10-23 DIAGNOSIS — Z01812 Encounter for preprocedural laboratory examination: Secondary | ICD-10-CM | POA: Diagnosis not present

## 2019-10-23 DIAGNOSIS — Z20828 Contact with and (suspected) exposure to other viral communicable diseases: Secondary | ICD-10-CM | POA: Insufficient documentation

## 2019-10-23 LAB — SARS CORONAVIRUS 2 (TAT 6-24 HRS): SARS Coronavirus 2: NEGATIVE

## 2019-10-23 NOTE — Addendum Note (Signed)
Addendum  created 10/23/19 1131 by Ollen Bowl, CRNA   Charge Capture section accepted

## 2019-10-25 ENCOUNTER — Encounter (HOSPITAL_COMMUNITY)
Admission: RE | Disposition: A | Discharge status: Home / Self Care | Payer: Self-pay | Source: Home / Self Care | Attending: Ophthalmology

## 2019-10-25 ENCOUNTER — Encounter (HOSPITAL_COMMUNITY): Payer: Self-pay | Admitting: *Deleted

## 2019-10-25 ENCOUNTER — Ambulatory Visit (HOSPITAL_COMMUNITY)
Admission: RE | Admit: 2019-10-25 | Discharge: 2019-10-25 | Disposition: A | Discharge status: Home / Self Care | Payer: Medicare HMO | Attending: Ophthalmology | Admitting: Ophthalmology

## 2019-10-25 ENCOUNTER — Ambulatory Visit (HOSPITAL_COMMUNITY): Payer: Medicare HMO | Admitting: Anesthesiology

## 2019-10-25 DIAGNOSIS — H3530 Unspecified macular degeneration: Secondary | ICD-10-CM | POA: Insufficient documentation

## 2019-10-25 DIAGNOSIS — H25811 Combined forms of age-related cataract, right eye: Secondary | ICD-10-CM | POA: Diagnosis not present

## 2019-10-25 DIAGNOSIS — M1990 Unspecified osteoarthritis, unspecified site: Secondary | ICD-10-CM | POA: Diagnosis not present

## 2019-10-25 DIAGNOSIS — H409 Unspecified glaucoma: Secondary | ICD-10-CM | POA: Diagnosis not present

## 2019-10-25 DIAGNOSIS — I10 Essential (primary) hypertension: Secondary | ICD-10-CM | POA: Diagnosis not present

## 2019-10-25 DIAGNOSIS — D649 Anemia, unspecified: Secondary | ICD-10-CM | POA: Insufficient documentation

## 2019-10-25 DIAGNOSIS — Z9842 Cataract extraction status, left eye: Secondary | ICD-10-CM | POA: Insufficient documentation

## 2019-10-25 HISTORY — PX: CATARACT EXTRACTION W/PHACO: SHX586

## 2019-10-25 SURGERY — PHACOEMULSIFICATION, CATARACT, WITH IOL INSERTION
Anesthesia: Monitor Anesthesia Care | Site: Eye | Laterality: Right

## 2019-10-25 MED ORDER — MIDAZOLAM HCL 2 MG/2ML IJ SOLN
INTRAMUSCULAR | Status: AC
  Filled 2019-10-25: qty 2

## 2019-10-25 MED ORDER — EPINEPHRINE PF 1 MG/ML IJ SOLN
INTRAOCULAR | Status: DC | PRN
  Administered 2019-10-25: 09:00:00 500 mL

## 2019-10-25 MED ORDER — PHENYLEPHRINE HCL 2.5 % OP SOLN
1.0000 [drp] | OPHTHALMIC | Status: AC | PRN
  Administered 2019-10-25 (×3): 1 [drp] via OPHTHALMIC

## 2019-10-25 MED ORDER — POVIDONE-IODINE 5 % OP SOLN
OPHTHALMIC | Status: DC | PRN
  Administered 2019-10-25: 1 via OPHTHALMIC

## 2019-10-25 MED ORDER — SODIUM HYALURONATE 23 MG/ML IO SOLN
INTRAOCULAR | Status: DC | PRN
  Administered 2019-10-25: 0.6 mL via INTRAOCULAR

## 2019-10-25 MED ORDER — NEOMYCIN-POLYMYXIN-DEXAMETH 3.5-10000-0.1 OP SUSP
OPHTHALMIC | Status: DC | PRN
  Administered 2019-10-25: 2 [drp] via OPHTHALMIC

## 2019-10-25 MED ORDER — CYCLOPENTOLATE-PHENYLEPHRINE 0.2-1 % OP SOLN
1.0000 [drp] | OPHTHALMIC | Status: AC | PRN
  Administered 2019-10-25 (×3): 1 [drp] via OPHTHALMIC

## 2019-10-25 MED ORDER — LIDOCAINE HCL 3.5 % OP GEL: 1.0000 "application " | Freq: Once | OPHTHALMIC | Status: DC

## 2019-10-25 MED ORDER — PROVISC 10 MG/ML IO SOLN
INTRAOCULAR | Status: DC | PRN
  Administered 2019-10-25: 0.85 mL via INTRAOCULAR

## 2019-10-25 MED ORDER — LIDOCAINE HCL (PF) 1 % IJ SOLN
INTRAOCULAR | Status: DC | PRN
  Administered 2019-10-25: 09:00:00 1 mL via OPHTHALMIC

## 2019-10-25 MED ORDER — BSS IO SOLN
INTRAOCULAR | Status: DC | PRN
  Administered 2019-10-25: 15 mL via INTRAOCULAR

## 2019-10-25 MED ORDER — TETRACAINE HCL 0.5 % OP SOLN
1.0000 [drp] | OPHTHALMIC | Status: AC | PRN
  Administered 2019-10-25 (×3): 1 [drp] via OPHTHALMIC

## 2019-10-25 SURGICAL SUPPLY — 13 items

## 2019-10-25 NOTE — Anesthesia Postprocedure Evaluation (Signed)
Anesthesia Post Note  Patient: CALEB PRIGMORE  Procedure(s) Performed: CATARACT EXTRACTION PHACO AND INTRAOCULAR LENS PLACEMENT ([facility]) (Right Eye)  Patient location during evaluation: PACU Anesthesia Type: MAC Level of consciousness: awake and alert Pain management: pain level controlled Vital Signs Assessment: post-procedure vital signs reviewed and stable Respiratory status: spontaneous breathing, nonlabored ventilation, respiratory function stable and patient connected to nasal cannula oxygen Cardiovascular status: stable and blood pressure returned to baseline Postop Assessment: no apparent nausea or vomiting Anesthetic complications: no     Last Vitals:  Vitals:   10/25/19 0714 10/25/19 0847  BP: (!) 151/70 (!) 160/50  Pulse:  68  Resp: 18 18  Temp: 36.8 C 36.6 C  SpO2: 98% 97%    Last Pain:  Vitals:   10/25/19 0847  TempSrc: Oral  PainSc: 0-No pain                 Balian Schaller

## 2019-10-25 NOTE — Op Note (Signed)
Date of procedure: 10/25/19  Pre-operative diagnosis:  Visually significant combined form age-related cataract, Right Eye (H25.811)  Post-operative diagnosis:  Visually significant combined form age-related cataract, Right Eye (H25.811)  Procedure: Removal of cataract via phacoemulsification and insertion of intra-ocular lens Wynetta Emery and [employer] PCB00  +14.5D into the capsular bag of the Right Eye  Attending surgeon: Gerda Diss. Mckensie Scotti, MD, MA  Anesthesia: MAC, Topical Akten  Complications: None  Estimated Blood Loss: <55m (minimal)  Specimens: None  Implants: As above  Indications:  Visually significant age-related cataract, Right Eye  Procedure:  The patient was seen and identified in the pre-operative area. The operative eye was identified and dilated.  The operative eye was marked.  Topical anesthesia was administered to the operative eye.     The patient was then to the operative suite and placed in the supine position.  A timeout was performed confirming the patient, procedure to be performed, and all other relevant information.   The patient's face was prepped and draped in the usual fashion for intra-ocular surgery.  A lid speculum was placed into the operative eye and the surgical microscope moved into place and focused.  A superotemporal paracentesis was created using a 20 gauge paracentesis blade.  Shugarcaine was injected into the anterior chamber.  Viscoelastic was injected into the anterior chamber.  A temporal clear-corneal main wound incision was created using a 2.474mmicrokeratome.  A continuous curvilinear capsulorrhexis was initiated using an irrigating cystitome and completed using capsulorrhexis forceps.  Hydrodissection and hydrodeliniation were performed.  Viscoelastic was injected into the anterior chamber.  A phacoemulsification handpiece and a chopper as a second instrument were used to remove the nucleus and epinucleus. The irrigation/aspiration handpiece was  used to remove any remaining cortical material.   The capsular bag was reinflated with viscoelastic, checked, and found to be intact.  The intraocular lens was inserted into the capsular bag.  The irrigation/aspiration handpiece was used to remove any remaining viscoelastic.  The clear corneal wound and paracentesis wounds were then hydrated and checked with Weck-Cels to be watertight.  The lid-speculum and drape was removed, and the patient's face was cleaned with a wet and dry 4x4.  Maxitrol was instilled in the eye before a clear shield was taped over the eye. The patient was taken to the post-operative care unit in good condition, having tolerated the procedure well.  Post-Op Instructions: [facility] and will receive all other orders and instructions.

## 2019-10-25 NOTE — Anesthesia Preprocedure Evaluation (Addendum)
Anesthesia Evaluation  Patient identified by MRN, date of birth, ID band Patient awake    Reviewed: Allergy & Precautions, NPO status , Patient's Chart, lab work & pertinent test results  History of Anesthesia Complications Negative for: history of anesthetic complications  Airway Mallampati: III  TM Distance: >3 FB Neck ROM: Full    Dental  (+) Upper Dentures, Lower Dentures   Pulmonary neg pulmonary ROS,    Pulmonary exam normal breath sounds clear to auscultation       Cardiovascular Exercise Tolerance: Good METS: 3 - Mets hypertension, Pt. on medications Normal cardiovascular exam Rhythm:Regular Rate:Normal     Neuro/Psych negative neurological ROS  negative psych ROS   GI/Hepatic negative GI ROS, Neg liver ROS,   Endo/Other  negative endocrine ROS  Renal/GU Renal InsufficiencyRenal disease     Musculoskeletal   Abdominal   Peds  Hematology  (+) anemia ,   Anesthesia Other Findings   Reproductive/Obstetrics                            Anesthesia Physical Anesthesia Plan  ASA: II  Anesthesia Plan: MAC   Post-op Pain Management:    Induction:   PONV Risk Score and Plan:   Airway Management Planned: Nasal Cannula and Natural Airway  Additional Equipment:   Intra-op Plan:   Post-operative Plan:   Informed Consent: I have reviewed the patients History and Physical, chart, labs and discussed the procedure including the risks, benefits and alternatives for the proposed anesthesia with the patient or authorized representative who has indicated his/her understanding and acceptance.     Dental advisory given  Plan Discussed with: CRNA  Anesthesia Plan Comments:         Anesthesia Quick Evaluation

## 2019-10-25 NOTE — Transfer of Care (Signed)
Immediate Anesthesia Transfer of Care Note  Patient: Gabrielle [name]  Procedure(s) Performed: CATARACT EXTRACTION PHACO AND INTRAOCULAR LENS PLACEMENT ([facility]) (Right Eye)  Patient Location: PACU  Anesthesia Type:MAC  Level of Consciousness: awake, alert  and oriented  Airway & Oxygen Therapy: Patient Spontanous Breathing  Post-op Assessment: Report given to RN and Post -op Vital signs reviewed and stable  Post vital signs: Reviewed and stable  Last Vitals:  Vitals Value Taken Time  BP    Temp    Pulse    Resp    SpO2      Last Pain:  Vitals:   10/25/19 0714  TempSrc: Oral  PainSc: 0-No pain      Patients Stated Pain Goal: 7 (15/05/69 [id number])  Complications: No apparent anesthesia complications

## 2019-10-25 NOTE — Interval H&P Note (Signed)
History and Physical Interval Note: The H and P was reviewed and updated. The patient was examined.  No changes were found after exam.  The surgical eye was marked.  10/25/2019 8:21 AM  Gabrielle [name]  has presented today for surgery, with the diagnosis of Nuclear sclerotic cataract - Right eye.  The various methods of treatment have been discussed with the patient and family. After consideration of risks, benefits and other options for treatment, the patient has consented to  Procedure(s) with comments: CATARACT EXTRACTION PHACO AND INTRAOCULAR LENS PLACEMENT ([facility]) (Right) - right as a surgical intervention.  The patient's history has been reviewed, patient examined, no change in status, stable for surgery.  I have reviewed the patient's chart and labs.  Questions were answered to the patient's satisfaction.     Baruch Goldmann

## 2019-10-25 NOTE — Discharge Instructions (Signed)
Please discharge patient when stable, will follow up today with Dr. Tinie Mcgloin at the [facility] office immediately following discharge.  Leave shield in place until visit.  All paperwork with discharge instructions will be given at the office. ° °

## 2019-10-28 ENCOUNTER — Encounter (HOSPITAL_COMMUNITY): Payer: Self-pay | Admitting: Ophthalmology

## 2019-11-04 NOTE — Progress Notes (Signed)
[facility] Note  11/07/2019     CHIEF COMPLAINT Patient presents for Retina Follow Up   HISTORY OF PRESENT ILLNESS: Gabrielle [name] is a 73 y.o. female who presents to the clinic today for:   HPI    Retina Follow Up    Patient presents with  Dry AMD.  In both eyes.  This started 3 months ago.  Since onset it is stable.  I, the attending physician,  performed the HPI with the patient and updated documentation appropriately.          Comments    F/U EXU AMD OU. Patient states she had cataract sx os 10/11/2019, 10/25/2019  Od , she see better as time goes. Patient is using Dorzolamide tid ou and prednisolone bid od until gone.         Last edited by Bernarda Caffey, MD on 11/07/2019  8:56 AM. (History)    pt had cataract sx OS on 11.06.20 and OD on 11.20.20 with Dr. Marisa Hua, she feels like everything has gotten brighter, but she still cannot see well enough to read, she is still using Cosopt BID and Omnipred TID   Referring physician: Janora Norlander, DO [place],  [place] [address]  HISTORICAL INFORMATION:   Selected notes from the MEDICAL RECORD NUMBER Referred by Dr. Cristela Blue for concern of ARMD OU;  LEE- 01.03.19 Cristela Blue) [BCVA OD: 10/50 OS: 20/80] Ocular Hx- ARMD OU, DME PMH- elevated chol., HTN    CURRENT MEDICATIONS: Current Outpatient Medications (Ophthalmic Drugs)  Medication Sig  . dorzolamide-timolol (COSOPT) 22.3-6.8 MG/ML ophthalmic solution Place 1 drop into both eyes 2 (two) times daily.   No current facility-administered medications for this visit.  (Ophthalmic Drugs)   Current Outpatient Medications (Other)  Medication Sig  . alendronate (FOSAMAX) 70 MG tablet Take 1 tablet (70 mg total) by mouth every 7 (seven) days. Take with a full glass of water on an empty stomach.  Marland Kitchen atorvastatin (LIPITOR) 20 MG tablet Take 1 tablet (20 mg total) by mouth daily.  . cholecalciferol (VITAMIN D3) 25 MCG (1000 UT) tablet Take  1,000 Units by mouth daily.  . diclofenac sodium (VOLTAREN) 1 % GEL Apply 4 g topically 4 (four) times daily. (Patient taking differently: Apply 4 g topically 2 (two) times daily. )  . ferrous sulfate 325 (65 FE) MG EC tablet Take 1 tablet (325 mg total) by mouth 2 (two) times daily with a meal.  . losartan-hydrochlorothiazide (HYZAAR) 100-25 MG tablet Take one (1) by mouth daily.   No current facility-administered medications for this visit.  (Other)      REVIEW OF SYSTEMS: ROS    Positive for: Eyes   Negative for: Constitutional, Gastrointestinal, Neurological, Skin, Genitourinary, Musculoskeletal, HENT, Endocrine, Cardiovascular, Respiratory, Psychiatric, Allergic/Imm, Heme/Lymph   Last edited by Zenovia Jordan, LPN on 11/10/5169  0:17 AM. (History)       ALLERGIES Allergies  Allergen Reactions  . Lisinopril Cough    Pt states she is not allergic to lisinopril    PAST MEDICAL HISTORY Past Medical History:  Diagnosis Date  . Cataract    OU  . H/O cesarean section   . Hx of tonsillectomy   . Hyperlipidemia   . Hypertension   . Macular degeneration    Exu ARMD OU   Past Surgical History:  Procedure Laterality Date  . CARPAL TUNNEL RELEASE Right   . CATARACT EXTRACTION W/PHACO Left 10/11/2019   Procedure: CATARACT  EXTRACTION PHACO AND INTRAOCULAR LENS PLACEMENT (IOC);  Surgeon: Baruch Goldmann, MD;  Location: [facility];  Service: Ophthalmology;  Laterality: Left;  CDE: 8.56  . CATARACT EXTRACTION W/PHACO Right 10/25/2019   Procedure: CATARACT EXTRACTION PHACO AND INTRAOCULAR LENS PLACEMENT (IOC);  Surgeon: Baruch Goldmann, MD;  Location: [facility];  Service: Ophthalmology;  Laterality: Right;  CDE: 5.67  . CESAREAN SECTION    . TONSILLECTOMY      FAMILY HISTORY Family History  Problem Relation Age of Onset  . Macular degeneration Mother   . Stroke Mother   . Hypertension Mother   . Dementia Mother   . Cancer Father        lung  . Diabetes Sister   . Hypertension  Sister     SOCIAL HISTORY Social History   Tobacco Use  . Smoking status: Never Smoker  . Smokeless tobacco: Never Used  Substance Use Topics  . Alcohol use: No    Frequency: Never  . Drug use: No         OPHTHALMIC EXAM:  Base Eye Exam    Visual Acuity (Snellen - Linear)      Right Left   Dist [place] 20/150 20/150   Dist ph  NI 20/100 -2       Tonometry (Tonopen, 8:24 AM)      Right Left   Pressure 15 16       Pupils      Dark Light Shape React APD   Right 3 2 Round Brisk None   Left 3 2 Round Brisk None       Visual Fields (Counting fingers)      Left Right    Full Full       Extraocular Movement      Right Left    Full, Ortho Full, Ortho       Neuro/Psych    Oriented x3: Yes   Mood/Affect: Normal       Dilation    Both eyes: 1.0% Mydriacyl, 2.5% Phenylephrine @ 8:23 AM        Slit Lamp and Fundus Exam    Slit Lamp Exam      Right Left   Lids/Lashes Dermatochalasis - upper lid Dermatochalasis - upper lid   Conjunctiva/Sclera White and quiet White and quiet   Cornea Arcus, 1-2+ Punctate epithelial erosions Arcus, 1-2+ Punctate epithelial erosions   Anterior Chamber Deep and quiet Deep and quiet   Iris Round and well dilated Round and well dilated   Lens PCIOL in good position PCIOL in good position   Vitreous Vitreous syneresis, Posterior vitreous detachment Vitreous syneresis, Posterior vitreous detachment       Fundus Exam      Right Left   Disc pink and sharp, compact pink and sharp, Compact   C/D Ratio 0.2 0.2   Macula Blunted foveal reflex, Drusen, Pigment clumping and atrophy, central thickening/disciform scar, +PED, no heme Blunted foveal reflex, central thickening, RPE clumping and atrophy, Drusen, RPE rip, stably improved IRF/SRF overlying PED   Vessels Vascular attenuation, Tortuous Vascular attenuation, Tortuous   Periphery Attached, mild Reticular degeneration Attached, mild Reticular degeneration          IMAGING AND  PROCEDURES  Imaging and Procedures for 03/21/18  OCT, Retina - OU - Both Eyes       Right Eye Quality was good. Central Foveal Thickness: 490. Progression has been stable. Findings include pigment epithelial detachment, epiretinal membrane, subretinal hyper-reflective material, retinal drusen , abnormal foveal contour,  no SRF, intraretinal fluid, disciform scar, outer retinal tubulation (Stable sub-retinal scar , minimal cystic changes overlying ).   Left Eye Quality was good. Central Foveal Thickness: 354. Progression has been stable. Findings include subretinal hyper-reflective material, pigment epithelial detachment, intraretinal fluid, retinal drusen , abnormal foveal contour, no SRF, outer retinal atrophy (persistent IRF overlying SRHM/PED).   Notes *Images captured and stored on drive  Diagnosis / Impression:  Exudative ARMD OU OD: Stable sub-retinal scar, Stable sub-retinal scar , minimal cystic changes overlying  OS: mild persistent IRF overlying SRHM/PED -- stable from prior  Clinical management:  See below  Abbreviations: NFP - Normal foveal profile. CME - cystoid macular edema. PED - pigment epithelial detachment. IRF - intraretinal fluid. SRF - subretinal fluid. EZ - ellipsoid zone. ERM - epiretinal membrane. ORA - outer retinal atrophy. ORT - outer retinal tubulation. SRHM - subretinal hyper-reflective material        Intravitreal Injection, Pharmacologic Agent - OS - Left Eye       Time Out 11/07/2019. 9:24 AM. Confirmed correct patient, procedure, site, and patient consented.   Anesthesia Topical anesthesia was used. Anesthetic medications included Lidocaine 2%, Proparacaine 0.5%.   Procedure Preparation included 5% betadine to ocular surface, eyelid speculum. A 30 gauge needle was used.   Injection:  2 mg aflibercept Alfonse Flavors) SOLN   NDC: A3590391, Lot: [id number], Expiration date: 04/30/2020   Route: Intravitreal, Site: Left Eye, Waste: 0.05  mL  Post-op Post injection exam found visual acuity of at least counting fingers. The patient tolerated the procedure well. There were no complications. The patient received written and verbal post procedure care education.                 ASSESSMENT/PLAN:    ICD-10-CM   1. Exudative age-related macular degeneration of both eyes with active choroidal neovascularization (HCC)  H35.3231 Intravitreal Injection, Pharmacologic Agent - OS - Left Eye    aflibercept (EYLEA) SOLN 2 mg  2. Retinal edema  H35.81 OCT, Retina - OU - Both Eyes  3. Posterior vitreous detachment of both eyes  H43.813   4. Combined form of age-related cataract, both eyes  H25.813   5. Ocular hypertension, bilateral  H40.053     1,2. Exudative age related macular degeneration, both eyes.    - severe exudative disease with very active CNVM OU at presentation in January 2019  - S/P IVA OD #1 (01.04.19), #2 (02.15.19)  - S/P IVA OS #1 (01.18.19), #2 (02.15.19)  - switched to Eylea 3.18.19 due to severity of disease  - S/P IVE OD #1 (03.18.19), #2 (04.17.19), #3 (05.15.19) -- injections held due to stable disciform scar, #4 (10.02.19) -- now on a q3-4 month interval schedule, #5 (01.15.20), #6 (06.04.20), #7 (10.29.20)  - S/P IVE OS #1 (03.18.19), #2 (04.17.19), #3 (05.15.19), #4 (06.12.19), #5 (07.10.19), #6 (08.07.19), #7 (09.04.19), #8 (10.02.19), #9 (11.06.19), #10 (12.11.19), #11 (01.15.20), #12 (02.20.20), #13 (03.26.20), #14 (04.30.20), #15 (06.04.20), #16 (07.16.20), # 17 (08.20.20), #18 (09.24.20), #19 (10.29.20)  - OCT OS today shows tr persistent IRF overlying PED  - OD with significant subretinal disciform scar - stable improvement in cystic changes  - will plan to treat OD ~q3-5 mos for maintenance/prevention -- last injection 10.29.20  - recommend IVE OS #20 today, 12.03.20 -- maintenance  - pt wishes to be treated with IVE  - RBA of procedure discussed, questions answered  - informed consent obtained  and signed  - see procedure note  - Eylea4U paperwork  filled out on 02.15.19 and fully approved through [employer] -- renewed 12/2018  - f/u in 7 wks for DFE/OCT/possible injection OS  3. PVD / vitreous syneresis  - Discussed findings and prognosis  - No RT or RD on 360  exam  - Reviewed s/s of RT/RD  - Strict return precautions for any such RT/RD signs/symptoms  4. Combined form of age related cataract OU-  - The symptoms of cataract, surgical options, and treatments and risks were discussed with patient.  - discussed diagnosis and progression  - approaching visual significance, but long discussion about limitations of cataract surgery due to presence of ARMD OU  - OS surgery scheduled for October 11, 2019 with Dr. Marisa Hua   5. Ocular Hypertension OU  - IOP okay today -- 15 OD, 16 OS  - cont Cosopt bid OU   Ophthalmic Meds Ordered this visit:  Meds ordered this encounter  Medications  . aflibercept (EYLEA) SOLN 2 mg       Return in about 7 weeks (around 12/26/2019) for f/u exu ARMD OU, DFE, OCT.  There are no Patient Instructions on file for this visit.  This document serves as a record of services personally performed by Gardiner Sleeper, MD, PhD. It was created on their behalf by Leeann Must, [place], a certified ophthalmic assistant. The creation of this record is the provider's dictation and/or activities during the visit.    Electronically signed by: Leeann Must, COA @TODAY @ 9:33 AM  Gardiner Sleeper, M.D., Ph.D. Diseases & Surgery of the Retina and Vitreous [facility]  I have reviewed the above documentation for accuracy and completeness, and I agree with the above. Gardiner Sleeper, M.D., Ph.D. 11/07/19 9:33 AM   Abbreviations: M myopia (nearsighted); A astigmatism; H hyperopia (farsighted); P presbyopia; Mrx spectacle prescription;  CTL contact lenses; OD right eye; OS left eye; OU both eyes  XT exotropia; ET esotropia; PEK punctate  epithelial keratitis; PEE punctate epithelial erosions; DES dry eye syndrome; MGD meibomian gland dysfunction; ATs artificial tears; PFAT's preservative free artificial tears; [place] nuclear sclerotic cataract; PSC posterior subcapsular cataract; ERM epi-retinal membrane; PVD posterior vitreous detachment; RD retinal detachment; DM diabetes mellitus; DR diabetic retinopathy; NPDR non-proliferative diabetic retinopathy; PDR proliferative diabetic retinopathy; CSME clinically significant macular edema; DME diabetic macular edema; dbh dot blot hemorrhages; CWS cotton wool spot; POAG primary open angle glaucoma; C/D cup-to-disc ratio; HVF humphrey visual field; GVF goldmann visual field; OCT optical coherence tomography; IOP intraocular pressure; BRVO Branch retinal vein occlusion; CRVO central retinal vein occlusion; CRAO central retinal artery occlusion; BRAO branch retinal artery occlusion; RT retinal tear; SB scleral buckle; PPV pars plana vitrectomy; VH Vitreous hemorrhage; PRP panretinal laser photocoagulation; IVK intravitreal kenalog; VMT vitreomacular traction; MH Macular hole;  NVD neovascularization of the disc; NVE neovascularization elsewhere; AREDS age related eye disease study; ARMD age related macular degeneration; POAG primary open angle glaucoma; EBMD epithelial/anterior basement membrane dystrophy; ACIOL anterior chamber intraocular lens; IOL intraocular lens; PCIOL posterior chamber intraocular lens; Phaco/IOL phacoemulsification with intraocular lens placement; [place] photorefractive keratectomy; LASIK laser assisted in situ keratomileusis; HTN hypertension; DM diabetes mellitus; COPD chronic obstructive pulmonary disease

## 2019-11-07 ENCOUNTER — Encounter (INDEPENDENT_AMBULATORY_CARE_PROVIDER_SITE_OTHER): Payer: Self-pay | Admitting: Ophthalmology

## 2019-11-07 ENCOUNTER — Ambulatory Visit (INDEPENDENT_AMBULATORY_CARE_PROVIDER_SITE_OTHER): Payer: Medicare HMO | Admitting: Ophthalmology

## 2019-11-07 DIAGNOSIS — H353231 Exudative age-related macular degeneration, bilateral, with active choroidal neovascularization: Secondary | ICD-10-CM | POA: Diagnosis not present

## 2019-11-07 DIAGNOSIS — H43813 Vitreous degeneration, bilateral: Secondary | ICD-10-CM | POA: Diagnosis not present

## 2019-11-07 DIAGNOSIS — H25813 Combined forms of age-related cataract, bilateral: Secondary | ICD-10-CM

## 2019-11-07 DIAGNOSIS — H3581 Retinal edema: Secondary | ICD-10-CM | POA: Diagnosis not present

## 2019-11-07 DIAGNOSIS — H40053 Ocular hypertension, bilateral: Secondary | ICD-10-CM

## 2019-11-07 MED ORDER — AFLIBERCEPT 2MG/0.05ML IZ SOLN FOR KALEIDOSCOPE
2.0000 mg | INTRAVITREAL | Status: AC | PRN
  Administered 2019-11-07: 10:00:00 2 mg via INTRAVITREAL

## 2019-11-19 ENCOUNTER — Other Ambulatory Visit: Payer: Self-pay | Admitting: Family Medicine

## 2019-11-19 DIAGNOSIS — I10 Essential (primary) hypertension: Secondary | ICD-10-CM

## 2019-12-09 DIAGNOSIS — Z0100 Encounter for examination of eyes and vision without abnormal findings: Secondary | ICD-10-CM | POA: Diagnosis not present

## 2019-12-18 NOTE — Progress Notes (Addendum)
[facility] Note  12/26/2019     CHIEF COMPLAINT Patient presents for Retina Follow Up   HISTORY OF PRESENT ILLNESS: Gabrielle [name] is a 74 y.o. female who presents to the clinic today for:   HPI    Retina Follow Up    Patient presents with  Wet AMD.  In both eyes.  Severity is moderate.  Duration of 7 weeks.  Since onset it is stable.  I, the attending physician,  performed the HPI with the patient and updated documentation appropriately.          Comments    Patient states vision the same OU. Taking cosopt bid OU. Had CE with IOL OU in November 2020. Got new glasses RX last week. Vision better with new RX.       Last edited by Bernarda Caffey, MD on 12/26/2019 11:45 PM. (History)    pt got new glasses last week and is happy with them  Referring physician: Janora Norlander, DO [place],  [place] [id number]  HISTORICAL INFORMATION:   Selected notes from the MEDICAL RECORD NUMBER Referred by Dr. Cristela Blue for concern of ARMD OU;  LEE- 01.03.19 Cristela Blue) [BCVA OD: 10/50 OS: 20/80] Ocular Hx- ARMD OU, DME PMH- elevated chol., HTN    CURRENT MEDICATIONS: Current Outpatient Medications (Ophthalmic Drugs)  Medication Sig  . dorzolamide-timolol (COSOPT) 22.3-6.8 MG/ML ophthalmic solution Place 1 drop into both eyes 2 (two) times daily.   No current facility-administered medications for this visit. (Ophthalmic Drugs)   Current Outpatient Medications (Other)  Medication Sig  . alendronate (FOSAMAX) 70 MG tablet Take 1 tablet (70 mg total) by mouth every 7 (seven) days. Take with a full glass of water on an empty stomach.  Marland Kitchen atorvastatin (LIPITOR) 20 MG tablet Take 1 tablet (20 mg total) by mouth daily.  . cholecalciferol (VITAMIN D3) 25 MCG (1000 UT) tablet Take 1,000 Units by mouth daily.  . diclofenac sodium (VOLTAREN) 1 % GEL Apply 4 g topically 4 (four) times daily. (Patient taking differently: Apply 4 g topically 2 (two) times daily.  )  . ferrous sulfate 325 (65 FE) MG EC tablet Take 1 tablet (325 mg total) by mouth 2 (two) times daily with a meal.  . losartan-hydrochlorothiazide (HYZAAR) 100-25 MG tablet TAKE 1 TABLET DAILY.  Needs to be seen for further refills.   No current facility-administered medications for this visit. (Other)      REVIEW OF SYSTEMS: ROS    Positive for: Cardiovascular, Eyes   Negative for: Constitutional, Gastrointestinal, Neurological, Skin, Genitourinary, Musculoskeletal, HENT, Endocrine, Respiratory, Psychiatric, Allergic/Imm, Heme/Lymph   Last edited by Roselee Nova D, COT on 12/26/2019  8:01 AM. (History)       ALLERGIES Allergies  Allergen Reactions  . Lisinopril Cough    Pt states she is not allergic to lisinopril    PAST MEDICAL HISTORY Past Medical History:  Diagnosis Date  . Cataract    OU  . H/O cesarean section   . Hx of tonsillectomy   . Hyperlipidemia   . Hypertension   . Macular degeneration    Exu ARMD OU   Past Surgical History:  Procedure Laterality Date  . CARPAL TUNNEL RELEASE Right   . CATARACT EXTRACTION W/PHACO Left 10/11/2019   Procedure: CATARACT EXTRACTION PHACO AND INTRAOCULAR LENS PLACEMENT (IOC);  Surgeon: Baruch Goldmann, MD;  Location: [facility];  Service: Ophthalmology;  Laterality: Left;  CDE: 8.56  . CATARACT EXTRACTION  W/PHACO Right 10/25/2019   Procedure: CATARACT EXTRACTION PHACO AND INTRAOCULAR LENS PLACEMENT (IOC);  Surgeon: Baruch Goldmann, MD;  Location: [facility];  Service: Ophthalmology;  Laterality: Right;  CDE: 5.67  . CESAREAN SECTION    . TONSILLECTOMY      FAMILY HISTORY Family History  Problem Relation Age of Onset  . Macular degeneration Mother   . Stroke Mother   . Hypertension Mother   . Dementia Mother   . Cancer Father        lung  . Diabetes Sister   . Hypertension Sister     SOCIAL HISTORY Social History   Tobacco Use  . Smoking status: Never Smoker  . Smokeless tobacco: Never Used  Substance Use Topics  .  Alcohol use: No  . Drug use: No         OPHTHALMIC EXAM:  Base Eye Exam    Visual Acuity (Snellen - Linear)      Right Left   Dist cc 20/150 -2 20/150 +2   Dist ph cc NI 20/80 -2       Tonometry (Tonopen, 8:12 AM)      Right Left   Pressure 15 12       Pupils      Dark Light Shape React APD   Right 4 3.5 Round Minimal None   Left 4 3.5 Round Minimal None       Visual Fields (Counting fingers)      Left Right    Full Full       Extraocular Movement      Right Left    Full, Ortho Full, Ortho       Neuro/Psych    Oriented x3: Yes   Mood/Affect: Normal       Dilation    Both eyes: 1.0% Mydriacyl, 2.5% Phenylephrine @ 8:12 AM        Slit Lamp and Fundus Exam    Slit Lamp Exam      Right Left   Lids/Lashes Dermatochalasis - upper lid Dermatochalasis - upper lid   Conjunctiva/Sclera White and quiet White and quiet   Cornea Arcus, 1-2+ Punctate epithelial erosions Arcus, 1-2+ Punctate epithelial erosions   Anterior Chamber Deep and quiet Deep and quiet   Iris Round and well dilated Round and well dilated   Lens PCIOL in good position PCIOL in good position   Vitreous Vitreous syneresis, Posterior vitreous detachment Vitreous syneresis, Posterior vitreous detachment       Fundus Exam      Right Left   Disc pink and sharp, compact pink and sharp, Compact   C/D Ratio 0.2 0.2   Macula Blunted foveal reflex, Drusen, Pigment clumping and atrophy, central thickening/disciform scar, +PED, no heme Blunted foveal reflex, central thickening, RPE clumping and atrophy, Drusen, RPE rip, stably improved IRF/SRF overlying PED   Vessels Vascular attenuation, Tortuous Vascular attenuation, Tortuous   Periphery Attached, mild Reticular degeneration Attached, mild Reticular degeneration        Refraction    Wearing Rx      Sphere Cylinder Add   Right [place] Sphere +3.50   Left -0.50 Sphere +3.50   Age: 18 wk   Type: PAL          IMAGING AND PROCEDURES  Imaging and  Procedures for 03/21/18  OCT, Retina - OU - Both Eyes       Right Eye Quality was good. Central Foveal Thickness: 503. Progression has been stable. Findings include pigment epithelial detachment, epiretinal  membrane, subretinal hyper-reflective material, retinal drusen , abnormal foveal contour, no SRF, intraretinal fluid, disciform scar, outer retinal tubulation (Stable sub-retinal scar , minimal cystic changes overlying ).   Left Eye Quality was good. Central Foveal Thickness: 378. Progression has been stable. Findings include subretinal hyper-reflective material, pigment epithelial detachment, intraretinal fluid, retinal drusen , abnormal foveal contour, no SRF, outer retinal atrophy (Mild interval improvement IRF overlying SRHM/PED).   Notes *Images captured and stored on drive  Diagnosis / Impression:  Exudative ARMD OU OD: Stable sub-retinal scar, Stable sub-retinal scar , minimal cystic changes overlying  OS: mild persistent IRF overlying SRHM/PED -- stable from prior  Clinical management:  See below  Abbreviations: NFP - Normal foveal profile. CME - cystoid macular edema. PED - pigment epithelial detachment. IRF - intraretinal fluid. SRF - subretinal fluid. EZ - ellipsoid zone. ERM - epiretinal membrane. ORA - outer retinal atrophy. ORT - outer retinal tubulation. SRHM - subretinal hyper-reflective material        Intravitreal Injection, Pharmacologic Agent - OD - Right Eye       Time Out 12/26/2019. 8:42 AM. Confirmed correct patient, procedure, site, and patient consented.   Anesthesia Topical anesthesia was used. Anesthetic medications included Lidocaine 2%, Proparacaine 0.5%.   Procedure Preparation included 5% betadine to ocular surface. A supplied (32 g) needle was used.   Injection:  2 mg aflibercept Alfonse Flavors) SOLN   NDC: A3590391, Lot: [id number], Expiration date: 05/31/2020   Route: Intravitreal, Site: Right Eye, Waste: 0.05 mL  Post-op Post injection  exam found visual acuity of at least counting fingers. The patient tolerated the procedure well. There were no complications. The patient received written and verbal post procedure care education.        Intravitreal Injection, Pharmacologic Agent - OS - Left Eye       Time Out 12/26/2019. 8:48 AM. Confirmed correct patient, procedure, site, and patient consented.   Anesthesia Topical anesthesia was used. Anesthetic medications included Lidocaine 2%, Proparacaine 0.5%.   Procedure Preparation included 5% betadine to ocular surface, eyelid speculum. A supplied (32 g) needle was used.   Injection:  2 mg aflibercept Alfonse Flavors) SOLN   NDC: A3590391, Lot: [id number], Expiration date: 05/31/2020   Route: Intravitreal, Site: Left Eye, Waste: 0.05 mL  Post-op Post injection exam found visual acuity of at least counting fingers. The patient tolerated the procedure well. There were no complications. The patient received written and verbal post procedure care education.                 ASSESSMENT/PLAN:    ICD-10-CM   1. Exudative age-related macular degeneration of both eyes with active choroidal neovascularization (HCC)  H35.3231 Intravitreal Injection, Pharmacologic Agent - OD - Right Eye    Intravitreal Injection, Pharmacologic Agent - OS - Left Eye    aflibercept (EYLEA) SOLN 2 mg    aflibercept (EYLEA) SOLN 2 mg  2. Retinal edema  H35.81 OCT, Retina - OU - Both Eyes  3. Posterior vitreous detachment of both eyes  H43.813   4. Pseudophakia of both eyes  Z96.1   5. Ocular hypertension, bilateral  H40.053     1,2. Exudative age related macular degeneration, both eyes.    - severe exudative disease with very active CNVM OU at presentation in January 2019  - S/P IVA OD #1 (01.04.19), #2 (02.15.19)  - S/P IVA OS #1 (01.18.19), #2 (02.15.19)  - switched to Eylea 3.18.19 due to severity of disease  - S/P IVE OD #1 (  03.18.19), #2 (04.17.19), #3 (05.15.19) -- injections held due to  stable disciform scar, #4 (10.02.19),  #5 (01.15.20), #6 (06.04.20), #7 (10.29.20)  - S/P IVE OS #1 (03.18.19), #2 (04.17.19), #3 (05.15.19), #4 (06.12.19), #5 (07.10.19), #6 (08.07.19), #7 (09.04.19), #8 (10.02.19), #9 (11.06.19), #10 (12.11.19), #11 (01.15.20), #12 (02.20.20), #13 (03.26.20), #14 (04.30.20), #15 (06.04.20), #16 (07.16.20), # 17 (08.20.20), #18 (09.24.20), #19 (10.29.20), #20 (12.03.20)  - OCT OS today shows mild interval improvement in IRF overlying PED  - OD with significant subretinal disciform scar - stable improvement in cystic changes  - will plan to treat OD ~q3-5 mos for maintenance/prevention -- last injection 10.29.20  - recommend IVE OD #8 and OS #21 today, 01.21.21 -- maintenance  - pt wishes to be treated with IVE OU  - RBA of procedure discussed, questions answered  - informed consent obtained and signed  - see procedure note  - Eylea4U paperwork filled out on 02.15.19 and fully approved through [employer] -- approved through March 2021  - will plan on injecting OS q8 wks (2 mos) and OD q16 wks (4 mos)  - f/u in 8 wks for DFE/OCT/possible injection OS  3. PVD / vitreous syneresis  - Discussed findings and prognosis  - No RT or RD on 360  exam  - Reviewed s/s of RT/RD  - Strict return precautions for any such RT/RD signs/symptoms  4. Pseudophakia OU  - s/p CE/IOL (Dr. Marisa Hua, 11.2020)  - beautiful surgeries w/ IOLs in excellent position, doing well  - monitor  5. Ocular Hypertension OU  - IOP okay today -- 15 OD, 16 OS  - cont Cosopt bid OU   Ophthalmic Meds Ordered this visit:  Meds ordered this encounter  Medications  . aflibercept (EYLEA) SOLN 2 mg  . aflibercept (EYLEA) SOLN 2 mg       Return in about 8 weeks (around 02/20/2020).  There are no Patient Instructions on file for this visit.  This document serves as a record of services personally performed by Gardiner Sleeper, MD, PhD. It was created on their behalf by Leeann Must, Amorita, a  certified ophthalmic assistant. The creation of this record is the provider's dictation and/or activities during the visit.    Electronically signed by: Leeann Must, COA @TODAY @ 12:32 PM   This document serves as a record of services personally performed by Gardiner Sleeper, MD, PhD. It was created on their behalf by Ernest Mallick, OA, an ophthalmic assistant. The creation of this record is the provider's dictation and/or activities during the visit.    Electronically signed by: Ernest Mallick, OA @TODAY @ 12:32 PM   Gardiner Sleeper, M.D., Ph.D. Diseases & Surgery of the Retina and Vitreous [facility]  I have reviewed the above documentation for accuracy and completeness, and I agree with the above. Gardiner Sleeper, M.D., Ph.D. 12/27/19 12:32 PM   Abbreviations: M myopia (nearsighted); A astigmatism; H hyperopia (farsighted); P presbyopia; Mrx spectacle prescription;  CTL contact lenses; OD right eye; OS left eye; OU both eyes  XT exotropia; ET esotropia; PEK punctate epithelial keratitis; PEE punctate epithelial erosions; DES dry eye syndrome; MGD meibomian gland dysfunction; ATs artificial tears; PFAT's preservative free artificial tears; Brown nuclear sclerotic cataract; PSC posterior subcapsular cataract; ERM epi-retinal membrane; PVD posterior vitreous detachment; RD retinal detachment; DM diabetes mellitus; DR diabetic retinopathy; NPDR non-proliferative diabetic retinopathy; PDR proliferative diabetic retinopathy; CSME clinically significant macular edema; DME diabetic macular edema; dbh dot blot hemorrhages;  CWS cotton wool spot; POAG primary open angle glaucoma; C/D cup-to-disc ratio; HVF humphrey visual field; GVF goldmann visual field; OCT optical coherence tomography; IOP intraocular pressure; BRVO Branch retinal vein occlusion; CRVO central retinal vein occlusion; CRAO central retinal artery occlusion; BRAO branch retinal artery occlusion; RT retinal tear; SB scleral  buckle; PPV pars plana vitrectomy; VH Vitreous hemorrhage; PRP panretinal laser photocoagulation; IVK intravitreal kenalog; VMT vitreomacular traction; MH Macular hole;  NVD neovascularization of the disc; NVE neovascularization elsewhere; AREDS age related eye disease study; ARMD age related macular degeneration; POAG primary open angle glaucoma; EBMD epithelial/anterior basement membrane dystrophy; ACIOL anterior chamber intraocular lens; IOL intraocular lens; PCIOL posterior chamber intraocular lens; Phaco/IOL phacoemulsification with intraocular lens placement; Brush photorefractive keratectomy; LASIK laser assisted in situ keratomileusis; HTN hypertension; DM diabetes mellitus; COPD chronic obstructive pulmonary disease

## 2019-12-26 ENCOUNTER — Encounter (INDEPENDENT_AMBULATORY_CARE_PROVIDER_SITE_OTHER): Payer: Self-pay | Admitting: Ophthalmology

## 2019-12-26 ENCOUNTER — Ambulatory Visit (INDEPENDENT_AMBULATORY_CARE_PROVIDER_SITE_OTHER): Payer: Medicare HMO | Admitting: Ophthalmology

## 2019-12-26 ENCOUNTER — Other Ambulatory Visit: Payer: Self-pay

## 2019-12-26 DIAGNOSIS — H3581 Retinal edema: Secondary | ICD-10-CM | POA: Diagnosis not present

## 2019-12-26 DIAGNOSIS — H43813 Vitreous degeneration, bilateral: Secondary | ICD-10-CM | POA: Diagnosis not present

## 2019-12-26 DIAGNOSIS — H353231 Exudative age-related macular degeneration, bilateral, with active choroidal neovascularization: Secondary | ICD-10-CM

## 2019-12-26 DIAGNOSIS — Z961 Presence of intraocular lens: Secondary | ICD-10-CM

## 2019-12-26 DIAGNOSIS — H40053 Ocular hypertension, bilateral: Secondary | ICD-10-CM

## 2019-12-26 MED ORDER — AFLIBERCEPT 2MG/0.05ML IZ SOLN FOR KALEIDOSCOPE
2.0000 mg | INTRAVITREAL | Status: AC | PRN
  Administered 2019-12-26: 2 mg via INTRAVITREAL

## 2019-12-27 MED ORDER — AFLIBERCEPT 2MG/0.05ML IZ SOLN FOR KALEIDOSCOPE
2.0000 mg | INTRAVITREAL | Status: AC | PRN
  Administered 2019-12-27: 2 mg via INTRAVITREAL

## 2020-02-03 DIAGNOSIS — H26492 Other secondary cataract, left eye: Secondary | ICD-10-CM | POA: Diagnosis not present

## 2020-02-03 DIAGNOSIS — H353231 Exudative age-related macular degeneration, bilateral, with active choroidal neovascularization: Secondary | ICD-10-CM | POA: Diagnosis not present

## 2020-02-03 DIAGNOSIS — Z961 Presence of intraocular lens: Secondary | ICD-10-CM | POA: Diagnosis not present

## 2020-02-17 NOTE — Progress Notes (Addendum)
[facility] Note  02/20/2020     CHIEF COMPLAINT Patient presents for Retina Follow Up   HISTORY OF PRESENT ILLNESS: Gabrielle [name] is a 74 y.o. female who presents to the clinic today for:   HPI    Retina Follow Up    Patient presents with  Wet AMD.  In both eyes.  Severity is moderate.  Duration of 8 weeks.  Since onset it is stable.  I, the attending physician,  performed the HPI with the patient and updated documentation appropriately.          Comments    Patient states vision the same OU. Using cosopt bid OU.       Last edited by Bernarda Caffey, MD on 02/20/2020  8:44 AM. (History)    pt states Dr. Lottie Rater released her on Monday  Referring physician: Janora Norlander, DO [place],  [place] [address]  HISTORICAL INFORMATION:   Selected notes from the MEDICAL RECORD NUMBER Referred by Dr. Cristela Blue for concern of ARMD OU;  LEE- 01.03.19 Cristela Blue) [BCVA OD: 10/50 OS: 20/80] Ocular Hx- ARMD OU, DME PMH- elevated chol., HTN    CURRENT MEDICATIONS: Current Outpatient Medications (Ophthalmic Drugs)  Medication Sig  . dorzolamide-timolol (COSOPT) 22.3-6.8 MG/ML ophthalmic solution Place 1 drop into both eyes 2 (two) times daily.   No current facility-administered medications for this visit. (Ophthalmic Drugs)   Current Outpatient Medications (Other)  Medication Sig  . alendronate (FOSAMAX) 70 MG tablet Take 1 tablet (70 mg total) by mouth every 7 (seven) days. Take with a full glass of water on an empty stomach.  Marland Kitchen atorvastatin (LIPITOR) 20 MG tablet Take 1 tablet (20 mg total) by mouth daily.  . cholecalciferol (VITAMIN D3) 25 MCG (1000 UT) tablet Take 1,000 Units by mouth daily.  . diclofenac sodium (VOLTAREN) 1 % GEL Apply 4 g topically 4 (four) times daily. (Patient taking differently: Apply 4 g topically 2 (two) times daily. )  . ferrous sulfate 325 (65 FE) MG EC tablet Take 1 tablet (325 mg total) by mouth 2 (two) times  daily with a meal.  . losartan-hydrochlorothiazide (HYZAAR) 100-25 MG tablet TAKE 1 TABLET DAILY.  Needs to be seen for further refills.   No current facility-administered medications for this visit. (Other)      REVIEW OF SYSTEMS: ROS    Positive for: Cardiovascular, Eyes   Negative for: Constitutional, Gastrointestinal, Neurological, Skin, Genitourinary, Musculoskeletal, HENT, Endocrine, Respiratory, Psychiatric, Allergic/Imm, Heme/Lymph   Last edited by Roselee Nova D, COT on 02/20/2020  7:57 AM. (History)       ALLERGIES Allergies  Allergen Reactions  . Lisinopril Cough    Pt states she is not allergic to lisinopril    PAST MEDICAL HISTORY Past Medical History:  Diagnosis Date  . Cataract    OU  . H/O cesarean section   . Hx of tonsillectomy   . Hyperlipidemia   . Hypertension   . Macular degeneration    Exu ARMD OU   Past Surgical History:  Procedure Laterality Date  . CARPAL TUNNEL RELEASE Right   . CATARACT EXTRACTION W/PHACO Left 10/11/2019   Procedure: CATARACT EXTRACTION PHACO AND INTRAOCULAR LENS PLACEMENT (IOC);  Surgeon: Baruch Goldmann, MD;  Location: [facility];  Service: Ophthalmology;  Laterality: Left;  CDE: 8.56  . CATARACT EXTRACTION W/PHACO Right 10/25/2019   Procedure: CATARACT EXTRACTION PHACO AND INTRAOCULAR LENS PLACEMENT (IOC);  Surgeon: Baruch Goldmann, MD;  Location:  [facility];  Service: Ophthalmology;  Laterality: Right;  CDE: 5.67  . CESAREAN SECTION    . TONSILLECTOMY      FAMILY HISTORY Family History  Problem Relation Age of Onset  . Macular degeneration Mother   . Stroke Mother   . Hypertension Mother   . Dementia Mother   . Cancer Father        lung  . Diabetes Sister   . Hypertension Sister     SOCIAL HISTORY Social History   Tobacco Use  . Smoking status: Never Smoker  . Smokeless tobacco: Never Used  Substance Use Topics  . Alcohol use: No  . Drug use: No         OPHTHALMIC EXAM:  Base Eye Exam    Visual Acuity  (Snellen - Linear)      Right Left   Dist [place] 20/150 20/80 +2   Dist ph [place] 20/150 +1 NI   Correction: Glasses       Tonometry (Tonopen, 8:09 AM)      Right Left   Pressure 14 13       Pupils      Dark Light Shape React APD   Right 3 2 Round Slow None   Left 3 2 Round Slow None       Visual Fields (Counting fingers)      Left Right    Full Full       Extraocular Movement      Right Left    Full, Ortho Full, Ortho       Neuro/Psych    Oriented x3: Yes   Mood/Affect: Normal       Dilation    Both eyes: 1.0% Mydriacyl, 2.5% Phenylephrine @ 8:09 AM        Slit Lamp and Fundus Exam    Slit Lamp Exam      Right Left   Lids/Lashes Dermatochalasis - upper lid Dermatochalasis - upper lid   Conjunctiva/Sclera White and quiet White and quiet   Cornea Arcus, 1-2+ Punctate epithelial erosions Arcus, 1-2+ Punctate epithelial erosions   Anterior Chamber Deep and quiet Deep and quiet   Iris Round and well dilated Round and well dilated   Lens PCIOL in good position PCIOL in good position   Vitreous Vitreous syneresis, Posterior vitreous detachment Vitreous syneresis, Posterior vitreous detachment       Fundus Exam      Right Left   Disc pink and sharp, compact pink and sharp, Compact   C/D Ratio 0.2 0.2   Macula Blunted foveal reflex, Drusen, Pigment clumping and atrophy, central thickening/disciform scar, +PED, no heme Blunted foveal reflex, central thickening, RPE clumping and atrophy, Drusen, RPE rip, mild IRF/SRF overlying PED   Vessels Vascular attenuation, Tortuous Vascular attenuation, Tortuous   Periphery Attached, mild Reticular degeneration Attached, mild Reticular degeneration        Refraction    Manifest Refraction      Sphere Cylinder Axis Dist VA   Right -0.25 +0.75 180 NI   Left -0.50 +0.50 180 20/60+1          IMAGING AND PROCEDURES  Imaging and Procedures for 03/21/18  OCT, Retina - OU - Both Eyes       Right Eye Quality was good. Central  Foveal Thickness: 535. Progression has been stable. Findings include pigment epithelial detachment, epiretinal membrane, subretinal hyper-reflective material, retinal drusen , abnormal foveal contour, no SRF, intraretinal fluid, disciform scar, outer retinal tubulation (Stable sub-retinal scar, minimal cystic  changes overlying -- no significant change from prior).   Left Eye Quality was good. Central Foveal Thickness: 414. Progression has been stable. Findings include subretinal hyper-reflective material, pigment epithelial detachment, intraretinal fluid, retinal drusen , abnormal foveal contour, no SRF, outer retinal atrophy (Mild IRF overlying SRHM/PED -- no significant change from prior).   Notes *Images captured and stored on drive  Diagnosis / Impression:  Exudative ARMD OU OD: Stable sub-retinal scar, Stable sub-retinal scar , minimal cystic changes overlying -- no significant change from prior OS: tr persistent IRF overlying SRHM/PED -- stable from prior  Clinical management:  See below  Abbreviations: NFP - Normal foveal profile. CME - cystoid macular edema. PED - pigment epithelial detachment. IRF - intraretinal fluid. SRF - subretinal fluid. EZ - ellipsoid zone. ERM - epiretinal membrane. ORA - outer retinal atrophy. ORT - outer retinal tubulation. SRHM - subretinal hyper-reflective material        Intravitreal Injection, Pharmacologic Agent - OS - Left Eye       Time Out 02/20/2020. 8:08 AM. Confirmed correct patient, procedure, site, and patient consented.   Anesthesia Topical anesthesia was used. Anesthetic medications included Lidocaine 2%, Proparacaine 0.5%.   Procedure Preparation included 5% betadine to ocular surface, eyelid speculum. A (32g) needle was used.   Injection:  2 mg aflibercept Alfonse Flavors) SOLN   NDC: [id number], Lot: [id number], Expiration date: 07/31/2020   Route: Intravitreal, Site: Left Eye, Waste: 0.05 mL  Post-op Post injection exam found visual  acuity of at least counting fingers. The patient tolerated the procedure well. There were no complications. The patient received written and verbal post procedure care education.                 ASSESSMENT/PLAN:    ICD-10-CM   1. Exudative age-related macular degeneration of both eyes with active choroidal neovascularization (HCC)  H35.3231 Intravitreal Injection, Pharmacologic Agent - OS - Left Eye    aflibercept (EYLEA) SOLN 2 mg  2. Retinal edema  H35.81 OCT, Retina - OU - Both Eyes  3. Posterior vitreous detachment of both eyes  H43.813   4. Pseudophakia of both eyes  Z96.1   5. Ocular hypertension, bilateral  H40.053     1,2. Exudative age related macular degeneration, both eyes.    - severe exudative disease with very active CNVM OU at presentation in January 2019  - S/P IVA OD #1 (01.04.19), #2 (02.15.19)  - S/P IVA OS #1 (01.18.19), #2 (02.15.19)  - switched to Eylea 3.18.19 due to severity of disease  - S/P IVE OD #1 (03.18.19), #2 (04.17.19), #3 (05.15.19) -- injections held due to stable disciform scar, #4 (10.02.19),  #5 (01.15.20), #6 (06.04.20), #7 (10.29.20), #8 (01.21.21)  - S/P IVE OS #1 (03.18.19), #2 (04.17.19), #3 (05.15.19), #4 (06.12.19), #5 (07.10.19), #6 (08.07.19), #7 (09.04.19), #8 (10.02.19), #9 (11.06.19), #10 (12.11.19), #11 (01.15.20), #12 (02.20.20), #13 (03.26.20), #14 (04.30.20), #15 (06.04.20), #16 (07.16.20), # 17 (08.20.20), #18 (09.24.20), #19 (10.29.20), #20 (12.03.20), #21 (01.21.21)  - OCT OS today shows tr persistent IRF overlying PED -- no significant change from prior  - OD with significant subretinal disciform scar - stable improvement in cystic changes  - will plan to treat OD ~q3-5 mos for maintenance/prevention -- last injection 01.21.21  - recommend IVE OS #22 today, 03.18.21 -- maintenance  - pt wishes to be treated with IVE OS  - RBA of procedure discussed, questions answered  - informed consent obtained  - see procedure note  -  Eylea  informed consent form signed and scanned on 01.21.2021  - Eylea4U paperwork filled out on 02.15.19 and fully approved through [employer] -- approved through March 2021  - will plan on injecting OS q8 wks (2 mos) and OD q16 wks (4 mos)  - f/u in 8 wks for DFE/OCT/possible injection OS  3. PVD / vitreous syneresis  - Discussed findings and prognosis  - No RT or RD on 360  exam  - Reviewed s/s of RT/RD  - Strict return precautions for any such RT/RD signs/symptoms  4. Pseudophakia OU  - s/p CE/IOL (Dr. Marisa Hua, 11.2020)  - beautiful surgeries w/ IOLs in excellent position, doing well  - monitor  5. Ocular Hypertension OU  - IOP okay today -- 14 OD, 13 OS  - cont Cosopt bid OU -- decrease to Qdaily   Ophthalmic Meds Ordered this visit:  Meds ordered this encounter  Medications  . aflibercept (EYLEA) SOLN 2 mg       Return in about 8 weeks (around 04/16/2020) for f/u exu ARMD OU, DFE, OCT.  There are no Patient Instructions on file for this visit.  This document serves as a record of services personally performed by Gardiner Sleeper, MD, PhD. It was created on their behalf by Leeann Must, [place], a certified ophthalmic assistant. The creation of this record is the provider's dictation and/or activities during the visit.   Electronically signed by: Leeann Must, COA @TODAY @ 9:10 AM   This document serves as a record of services personally performed by Gardiner Sleeper, MD, PhD. It was created on their behalf by Ernest Mallick, OA, an ophthalmic assistant. The creation of this record is the provider's dictation and/or activities during the visit.    Electronically signed by: Ernest Mallick, OA 03.18.2021 9:10 AM   Gardiner Sleeper, M.D., Ph.D. Diseases & Surgery of the Retina and Vitreous [facility]  I have reviewed the above documentation for accuracy and completeness, and I agree with the above. Gardiner Sleeper, M.D., Ph.D. 02/20/20 9:10  AM   Abbreviations: M myopia (nearsighted); A astigmatism; H hyperopia (farsighted); P presbyopia; Mrx spectacle prescription;  CTL contact lenses; OD right eye; OS left eye; OU both eyes  XT exotropia; ET esotropia; PEK punctate epithelial keratitis; PEE punctate epithelial erosions; DES dry eye syndrome; MGD meibomian gland dysfunction; ATs artificial tears; PFAT's preservative free artificial tears; [place] nuclear sclerotic cataract; PSC posterior subcapsular cataract; ERM epi-retinal membrane; PVD posterior vitreous detachment; RD retinal detachment; DM diabetes mellitus; DR diabetic retinopathy; NPDR non-proliferative diabetic retinopathy; PDR proliferative diabetic retinopathy; CSME clinically significant macular edema; DME diabetic macular edema; dbh dot blot hemorrhages; CWS cotton wool spot; POAG primary open angle glaucoma; C/D cup-to-disc ratio; HVF humphrey visual field; GVF goldmann visual field; OCT optical coherence tomography; IOP intraocular pressure; BRVO Branch retinal vein occlusion; CRVO central retinal vein occlusion; CRAO central retinal artery occlusion; BRAO branch retinal artery occlusion; RT retinal tear; SB scleral buckle; PPV pars plana vitrectomy; VH Vitreous hemorrhage; PRP panretinal laser photocoagulation; IVK intravitreal kenalog; VMT vitreomacular traction; MH Macular hole;  NVD neovascularization of the disc; NVE neovascularization elsewhere; AREDS age related eye disease study; ARMD age related macular degeneration; POAG primary open angle glaucoma; EBMD epithelial/anterior basement membrane dystrophy; ACIOL anterior chamber intraocular lens; IOL intraocular lens; PCIOL posterior chamber intraocular lens; Phaco/IOL phacoemulsification with intraocular lens placement; [place] photorefractive keratectomy; LASIK laser assisted in situ keratomileusis; HTN hypertension; DM diabetes mellitus; COPD chronic obstructive pulmonary disease

## 2020-02-20 ENCOUNTER — Encounter (INDEPENDENT_AMBULATORY_CARE_PROVIDER_SITE_OTHER): Payer: Self-pay | Admitting: Ophthalmology

## 2020-02-20 ENCOUNTER — Other Ambulatory Visit: Payer: Self-pay

## 2020-02-20 ENCOUNTER — Ambulatory Visit (INDEPENDENT_AMBULATORY_CARE_PROVIDER_SITE_OTHER): Payer: Medicare HMO | Admitting: Ophthalmology

## 2020-02-20 DIAGNOSIS — H3581 Retinal edema: Secondary | ICD-10-CM

## 2020-02-20 DIAGNOSIS — Z961 Presence of intraocular lens: Secondary | ICD-10-CM | POA: Diagnosis not present

## 2020-02-20 DIAGNOSIS — H40053 Ocular hypertension, bilateral: Secondary | ICD-10-CM | POA: Diagnosis not present

## 2020-02-20 DIAGNOSIS — H43813 Vitreous degeneration, bilateral: Secondary | ICD-10-CM | POA: Diagnosis not present

## 2020-02-20 DIAGNOSIS — H353231 Exudative age-related macular degeneration, bilateral, with active choroidal neovascularization: Secondary | ICD-10-CM | POA: Diagnosis not present

## 2020-02-20 MED ORDER — AFLIBERCEPT 2MG/0.05ML IZ SOLN FOR KALEIDOSCOPE
2.0000 mg | INTRAVITREAL | Status: AC | PRN
  Administered 2020-02-20: 2 mg via INTRAVITREAL

## 2020-03-02 ENCOUNTER — Other Ambulatory Visit: Payer: Self-pay | Admitting: Family Medicine

## 2020-03-02 DIAGNOSIS — I10 Essential (primary) hypertension: Secondary | ICD-10-CM

## 2020-03-02 MED ORDER — LOSARTAN POTASSIUM-HCTZ 100-25 MG PO TABS: ORAL_TABLET | ORAL | 0 refills | Status: DC

## 2020-03-02 NOTE — Telephone Encounter (Signed)
Refill sent to pharmacy. Aware to keep appointment

## 2020-03-02 NOTE — Telephone Encounter (Signed)
  Prescription Request  03/02/2020  What is the name of the medication or equipment? losartan  Have you contacted your pharmacy to request a refill? (if applicable) yes and they said she needs appointment but we don't have any   Which pharmacy would you like this sent to? [facility]   Patient notified that their request is being sent to the clinical staff for review and that they should receive a response within 2 business days.

## 2020-03-20 ENCOUNTER — Other Ambulatory Visit: Payer: Self-pay

## 2020-03-20 ENCOUNTER — Ambulatory Visit (INDEPENDENT_AMBULATORY_CARE_PROVIDER_SITE_OTHER): Payer: Medicare HMO | Admitting: Family Medicine

## 2020-03-20 DIAGNOSIS — M8580 Other specified disorders of bone density and structure, unspecified site: Secondary | ICD-10-CM | POA: Diagnosis not present

## 2020-03-20 DIAGNOSIS — D631 Anemia in chronic kidney disease: Secondary | ICD-10-CM

## 2020-03-20 DIAGNOSIS — I129 Hypertensive chronic kidney disease with stage 1 through stage 4 chronic kidney disease, or unspecified chronic kidney disease: Secondary | ICD-10-CM

## 2020-03-20 DIAGNOSIS — Z780 Asymptomatic menopausal state: Secondary | ICD-10-CM

## 2020-03-20 DIAGNOSIS — I10 Essential (primary) hypertension: Secondary | ICD-10-CM

## 2020-03-20 DIAGNOSIS — N1832 Chronic kidney disease, stage 3b: Secondary | ICD-10-CM | POA: Diagnosis not present

## 2020-03-20 DIAGNOSIS — E782 Mixed hyperlipidemia: Secondary | ICD-10-CM | POA: Diagnosis not present

## 2020-03-20 MED ORDER — LOSARTAN POTASSIUM-HCTZ 100-25 MG PO TABS: ORAL_TABLET | ORAL | 3 refills | Status: DC

## 2020-03-20 MED ORDER — ATORVASTATIN CALCIUM 20 MG PO TABS: 20.0000 mg | ORAL_TABLET | Freq: Every day | ORAL | 3 refills | Status: DC

## 2020-03-20 NOTE — Progress Notes (Signed)
Telephone visit  Subjective: CC: refills PCP: Janora Norlander, DO [id number] K Kilroy is a 74 y.o. female calls for telephone consult today. Patient provides verbal consent for consult held via phone.  Due to COVID-19 pandemic this visit was conducted virtually. This visit type was conducted due to national recommendations for restrictions regarding the COVID-19 Pandemic (e.g. social distancing, sheltering in place) in an effort to limit this patient's exposure and mitigate transmission in our community. All issues noted in this document were discussed and addressed.  A physical exam was not performed with this format.   Location of patient: home Location of provider: [facility] Others present for call: [place]  1. HTN/ HLD Patient reports compliance with losartan-hydrochlorothiazide 100-25 mg daily as well as her Lipitor 20 mg daily.  No chest pain, shortness of breath, edema or dizziness.   ROS: Per HPI  Allergies  Allergen Reactions  . Lisinopril Cough    Pt states she is not allergic to lisinopril   Past Medical History:  Diagnosis Date  . Cataract    OU  . H/O cesarean section   . Hx of tonsillectomy   . Hyperlipidemia   . Hypertension   . Macular degeneration    Exu ARMD OU    Current Outpatient Medications:  .  alendronate (FOSAMAX) 70 MG tablet, Take 1 tablet (70 mg total) by mouth every 7 (seven) days. Take with a full glass of water on an empty stomach., Disp: 4 tablet, Rfl: 11 .  atorvastatin (LIPITOR) 20 MG tablet, Take 1 tablet (20 mg total) by mouth daily., Disp: 90 tablet, Rfl: 3 .  cholecalciferol (VITAMIN D3) 25 MCG (1000 UT) tablet, Take 1,000 Units by mouth daily., Disp: , Rfl:  .  diclofenac sodium (VOLTAREN) 1 % GEL, Apply 4 g topically 4 (four) times daily. (Patient taking differently: Apply 4 g topically 2 (two) times daily. ), Disp: 400 g, Rfl: 2 .  dorzolamide-timolol (COSOPT) 22.3-6.8 MG/ML ophthalmic solution, Place 1 drop into both eyes 2 (two) times  daily., Disp: , Rfl:  .  ferrous sulfate 325 (65 FE) MG EC tablet, Take 1 tablet (325 mg total) by mouth 2 (two) times daily with a meal., Disp: 180 tablet, Rfl: 3 .  losartan-hydrochlorothiazide (HYZAAR) 100-25 MG tablet, TAKE 1 TABLET DAILY.  Needs to be seen for further refills., Disp: 30 tablet, Rfl: 0  Assessment/ Plan: 74 y.o. female   1. Essential hypertension Will come in for labs and spot BP check - losartan-hydrochlorothiazide (HYZAAR) 100-25 MG tablet; TAKE 1 TABLET DAILY.  Needs to be seen for further refills.  Dispense: 90 tablet; Refill: 3 - CMP14+EGFR; Future  2. Stage 3b chronic kidney disease - CMP14+EGFR; Future - CBC; Future  3. Mixed hyperlipidemia Will come in fasting on Monday - Lipid panel; Future - CMP14+EGFR; Future - TSH; Future  4. Osteopenia after menopause Not due for DEXA scan until August - TSH; Future  5. Anemia due to stage 3b chronic kidney disease Check CBC - CBC; Future   Start time: 12:41pm End time: 12:44pm  Total time spent on patient care (including telephone call/ virtual visit): 10 minutes  [place], [place] [phone]

## 2020-03-23 ENCOUNTER — Other Ambulatory Visit: Payer: Self-pay

## 2020-03-23 ENCOUNTER — Other Ambulatory Visit: Payer: Medicare HMO

## 2020-03-23 DIAGNOSIS — D631 Anemia in chronic kidney disease: Secondary | ICD-10-CM | POA: Diagnosis not present

## 2020-03-23 DIAGNOSIS — Z780 Asymptomatic menopausal state: Secondary | ICD-10-CM

## 2020-03-23 DIAGNOSIS — I10 Essential (primary) hypertension: Secondary | ICD-10-CM

## 2020-03-23 DIAGNOSIS — E782 Mixed hyperlipidemia: Secondary | ICD-10-CM | POA: Diagnosis not present

## 2020-03-23 DIAGNOSIS — N1832 Chronic kidney disease, stage 3b: Secondary | ICD-10-CM | POA: Diagnosis not present

## 2020-03-23 DIAGNOSIS — M8580 Other specified disorders of bone density and structure, unspecified site: Secondary | ICD-10-CM | POA: Diagnosis not present

## 2020-03-24 ENCOUNTER — Other Ambulatory Visit: Payer: Self-pay | Admitting: Family Medicine

## 2020-03-24 ENCOUNTER — Other Ambulatory Visit: Payer: Self-pay | Admitting: *Deleted

## 2020-03-24 ENCOUNTER — Encounter: Payer: Self-pay | Admitting: Gastroenterology

## 2020-03-24 DIAGNOSIS — N179 Acute kidney failure, unspecified: Secondary | ICD-10-CM

## 2020-03-24 DIAGNOSIS — D649 Anemia, unspecified: Secondary | ICD-10-CM

## 2020-03-24 LAB — CBC
Hematocrit: 24.5 % — ABNORMAL LOW (ref 34.0–46.6)
Hemoglobin: 7.2 g/dL — CL (ref 11.1–15.9)
MCH: 23.8 pg — ABNORMAL LOW (ref 26.6–33.0)
MCHC: 29.4 g/dL — ABNORMAL LOW (ref 31.5–35.7)
MCV: 81 fL (ref 79–97)
Platelets: 246 10*3/uL (ref 150–450)
RBC: 3.03 x10E6/uL — ABNORMAL LOW (ref 3.77–5.28)
RDW: 15.4 % (ref 11.7–15.4)
WBC: 6.8 10*3/uL (ref 3.4–10.8)

## 2020-03-24 LAB — CMP14+EGFR
ALT: 4 IU/L (ref 0–32)
AST: 8 IU/L (ref 0–40)
Albumin/Globulin Ratio: 1.3 (ref 1.2–2.2)
Albumin: 3.8 g/dL (ref 3.7–4.7)
Alkaline Phosphatase: 106 IU/L (ref 39–117)
BUN/Creatinine Ratio: 22 (ref 12–28)
BUN: 38 mg/dL — ABNORMAL HIGH (ref 8–27)
Bilirubin Total: 0.2 mg/dL (ref 0.0–1.2)
CO2: 26 mmol/L (ref 20–29)
Calcium: 9.8 mg/dL (ref 8.7–10.3)
Chloride: 101 mmol/L (ref 96–106)
Creatinine, Ser: 1.7 mg/dL — ABNORMAL HIGH (ref 0.57–1.00)
GFR calc Af Amer: 34 mL/min/{1.73_m2} — ABNORMAL LOW (ref >59)
GFR calc non Af Amer: 29 mL/min/{1.73_m2} — ABNORMAL LOW (ref >59)
Globulin, Total: 2.9 g/dL (ref 1.5–4.5)
Glucose: 101 mg/dL — ABNORMAL HIGH (ref 65–99)
Potassium: 3.8 mmol/L (ref 3.5–5.2)
Sodium: 140 mmol/L (ref 134–144)
Total Protein: 6.7 g/dL (ref 6.0–8.5)

## 2020-03-24 LAB — LIPID PANEL
Chol/HDL Ratio: 3.4 ratio (ref 0.0–4.4)
Cholesterol, Total: 137 mg/dL (ref 100–199)
HDL: 40 mg/dL (ref >39)
LDL Chol Calc (NIH): 73 mg/dL (ref 0–99)
Triglycerides: 139 mg/dL (ref 0–149)
VLDL Cholesterol Cal: 24 mg/dL (ref 5–40)

## 2020-03-24 LAB — TSH: TSH: 2.5 u[IU]/mL (ref 0.450–4.500)

## 2020-03-24 NOTE — Progress Notes (Signed)
Labs already placed by me

## 2020-03-24 NOTE — Addendum Note (Signed)
Addended by: Janora Norlander on: 03/24/2020 10:16 AM   Modules accepted: Orders

## 2020-03-25 ENCOUNTER — Other Ambulatory Visit: Payer: Medicare HMO

## 2020-03-25 ENCOUNTER — Other Ambulatory Visit: Payer: Self-pay

## 2020-03-25 DIAGNOSIS — D649 Anemia, unspecified: Secondary | ICD-10-CM | POA: Diagnosis not present

## 2020-03-25 DIAGNOSIS — N179 Acute kidney failure, unspecified: Secondary | ICD-10-CM | POA: Diagnosis not present

## 2020-03-25 LAB — CBC
Hematocrit: 22.9 % — ABNORMAL LOW (ref 34.0–46.6)
Hemoglobin: 7.1 g/dL — CL (ref 11.1–15.9)
MCH: 24.6 pg — ABNORMAL LOW (ref 26.6–33.0)
MCHC: 31 g/dL — ABNORMAL LOW (ref 31.5–35.7)
MCV: 79 fL (ref 79–97)
Platelets: 219 10*3/uL (ref 150–450)
RBC: 2.89 x10E6/uL — ABNORMAL LOW (ref 3.77–5.28)
RDW: 15.4 % (ref 11.7–15.4)
WBC: 6.2 10*3/uL (ref 3.4–10.8)

## 2020-03-26 ENCOUNTER — Telehealth: Payer: Self-pay | Admitting: Family Medicine

## 2020-03-26 LAB — ANEMIA PANEL
Ferritin: 17 ng/mL (ref 15–150)
Folate, Hemolysate: 245 ng/mL
Folate, RBC: 1029 ng/mL (ref >498)
Hematocrit: 23.8 % — ABNORMAL LOW (ref 34.0–46.6)
Iron Saturation: 5 % — CL (ref 15–55)
Iron: 18 ug/dL — ABNORMAL LOW (ref 27–139)
Retic Ct Pct: 2.1 % (ref 0.6–2.6)
Total Iron Binding Capacity: 336 ug/dL (ref 250–450)
UIBC: 318 ug/dL (ref 118–369)
Vitamin B-12: 553 pg/mL (ref 232–1245)

## 2020-03-26 LAB — BASIC METABOLIC PANEL
BUN/Creatinine Ratio: 22 (ref 12–28)
BUN: 34 mg/dL — ABNORMAL HIGH (ref 8–27)
CO2: 24 mmol/L (ref 20–29)
Calcium: 9.4 mg/dL (ref 8.7–10.3)
Chloride: 100 mmol/L (ref 96–106)
Creatinine, Ser: 1.54 mg/dL — ABNORMAL HIGH (ref 0.57–1.00)
GFR calc Af Amer: 38 mL/min/{1.73_m2} — ABNORMAL LOW (ref >59)
GFR calc non Af Amer: 33 mL/min/{1.73_m2} — ABNORMAL LOW (ref >59)
Glucose: 94 mg/dL (ref 65–99)
Potassium: 3.9 mmol/L (ref 3.5–5.2)
Sodium: 138 mmol/L (ref 134–144)

## 2020-03-26 NOTE — Telephone Encounter (Signed)
Patient made aware of lab results. Please see result notes.

## 2020-03-27 ENCOUNTER — Other Ambulatory Visit: Payer: Self-pay | Admitting: *Deleted

## 2020-03-27 DIAGNOSIS — D649 Anemia, unspecified: Secondary | ICD-10-CM

## 2020-03-28 ENCOUNTER — Other Ambulatory Visit: Payer: Self-pay | Admitting: Family Medicine

## 2020-03-28 NOTE — Progress Notes (Signed)
error 

## 2020-03-30 ENCOUNTER — Other Ambulatory Visit: Payer: Medicare HMO

## 2020-03-30 ENCOUNTER — Other Ambulatory Visit: Payer: Self-pay | Admitting: Family Medicine

## 2020-03-30 ENCOUNTER — Other Ambulatory Visit (INDEPENDENT_AMBULATORY_CARE_PROVIDER_SITE_OTHER): Payer: Self-pay | Admitting: Ophthalmology

## 2020-03-30 ENCOUNTER — Other Ambulatory Visit: Payer: Self-pay

## 2020-03-30 DIAGNOSIS — D649 Anemia, unspecified: Secondary | ICD-10-CM

## 2020-03-30 DIAGNOSIS — M19019 Primary osteoarthritis, unspecified shoulder: Secondary | ICD-10-CM

## 2020-03-30 LAB — CBC WITH DIFFERENTIAL/PLATELET
Basophils Absolute: 0 10*3/uL (ref 0.0–0.2)
Basos: 0 %
EOS (ABSOLUTE): 0.2 10*3/uL (ref 0.0–0.4)
Eos: 3 %
Hematocrit: 23.6 % — ABNORMAL LOW (ref 34.0–46.6)
Hemoglobin: 7 g/dL — CL (ref 11.1–15.9)
Immature Grans (Abs): 0 10*3/uL (ref 0.0–0.1)
Immature Granulocytes: 0 %
Lymphocytes Absolute: 1.2 10*3/uL (ref 0.7–3.1)
Lymphs: 17 %
MCH: 24.2 pg — ABNORMAL LOW (ref 26.6–33.0)
MCHC: 29.7 g/dL — ABNORMAL LOW (ref 31.5–35.7)
MCV: 82 fL (ref 79–97)
Monocytes Absolute: 0.6 10*3/uL (ref 0.1–0.9)
Monocytes: 8 %
Neutrophils Absolute: 5.3 10*3/uL (ref 1.4–7.0)
Neutrophils: 72 %
Platelets: 230 10*3/uL (ref 150–450)
RBC: 2.89 x10E6/uL — ABNORMAL LOW (ref 3.77–5.28)
RDW: 15.8 % — ABNORMAL HIGH (ref 11.7–15.4)
WBC: 7.3 10*3/uL (ref 3.4–10.8)

## 2020-04-04 ENCOUNTER — Other Ambulatory Visit: Payer: Self-pay

## 2020-04-04 ENCOUNTER — Inpatient Hospital Stay (HOSPITAL_COMMUNITY)
Admission: EM | Admit: 2020-04-04 | Discharge: 2020-04-12 | DRG: 330 | Disposition: A | Discharge status: Home Health | Payer: Medicare HMO | Attending: Internal Medicine | Admitting: Internal Medicine

## 2020-04-04 ENCOUNTER — Encounter (HOSPITAL_COMMUNITY): Payer: Self-pay | Admitting: *Deleted

## 2020-04-04 DIAGNOSIS — C182 Malignant neoplasm of ascending colon: Principal | ICD-10-CM | POA: Diagnosis present

## 2020-04-04 DIAGNOSIS — I129 Hypertensive chronic kidney disease with stage 1 through stage 4 chronic kidney disease, or unspecified chronic kidney disease: Secondary | ICD-10-CM | POA: Diagnosis present

## 2020-04-04 DIAGNOSIS — E876 Hypokalemia: Secondary | ICD-10-CM | POA: Diagnosis present

## 2020-04-04 DIAGNOSIS — K319 Disease of stomach and duodenum, unspecified: Secondary | ICD-10-CM | POA: Diagnosis present

## 2020-04-04 DIAGNOSIS — I10 Essential (primary) hypertension: Secondary | ICD-10-CM | POA: Diagnosis not present

## 2020-04-04 DIAGNOSIS — H269 Unspecified cataract: Secondary | ICD-10-CM | POA: Diagnosis present

## 2020-04-04 DIAGNOSIS — N390 Urinary tract infection, site not specified: Secondary | ICD-10-CM | POA: Diagnosis present

## 2020-04-04 DIAGNOSIS — R310 Gross hematuria: Secondary | ICD-10-CM | POA: Diagnosis not present

## 2020-04-04 DIAGNOSIS — H3530 Unspecified macular degeneration: Secondary | ICD-10-CM | POA: Diagnosis present

## 2020-04-04 DIAGNOSIS — Z808 Family history of malignant neoplasm of other organs or systems: Secondary | ICD-10-CM | POA: Diagnosis not present

## 2020-04-04 DIAGNOSIS — N3289 Other specified disorders of bladder: Secondary | ICD-10-CM | POA: Diagnosis not present

## 2020-04-04 DIAGNOSIS — C189 Malignant neoplasm of colon, unspecified: Secondary | ICD-10-CM

## 2020-04-04 DIAGNOSIS — Z833 Family history of diabetes mellitus: Secondary | ICD-10-CM | POA: Diagnosis not present

## 2020-04-04 DIAGNOSIS — Z20822 Contact with and (suspected) exposure to covid-19: Secondary | ICD-10-CM | POA: Diagnosis present

## 2020-04-04 DIAGNOSIS — Z801 Family history of malignant neoplasm of trachea, bronchus and lung: Secondary | ICD-10-CM

## 2020-04-04 DIAGNOSIS — N183 Chronic kidney disease, stage 3 unspecified: Secondary | ICD-10-CM | POA: Diagnosis present

## 2020-04-04 DIAGNOSIS — Z7983 Long term (current) use of bisphosphonates: Secondary | ICD-10-CM

## 2020-04-04 DIAGNOSIS — Z888 Allergy status to other drugs, medicaments and biological substances status: Secondary | ICD-10-CM | POA: Diagnosis not present

## 2020-04-04 DIAGNOSIS — E782 Mixed hyperlipidemia: Secondary | ICD-10-CM | POA: Diagnosis present

## 2020-04-04 DIAGNOSIS — B9681 Helicobacter pylori [H. pylori] as the cause of diseases classified elsewhere: Secondary | ICD-10-CM | POA: Diagnosis present

## 2020-04-04 DIAGNOSIS — K922 Gastrointestinal hemorrhage, unspecified: Secondary | ICD-10-CM | POA: Diagnosis not present

## 2020-04-04 DIAGNOSIS — M8580 Other specified disorders of bone density and structure, unspecified site: Secondary | ICD-10-CM | POA: Diagnosis present

## 2020-04-04 DIAGNOSIS — E785 Hyperlipidemia, unspecified: Secondary | ICD-10-CM | POA: Diagnosis present

## 2020-04-04 DIAGNOSIS — D649 Anemia, unspecified: Secondary | ICD-10-CM | POA: Diagnosis not present

## 2020-04-04 DIAGNOSIS — D500 Iron deficiency anemia secondary to blood loss (chronic): Secondary | ICD-10-CM | POA: Diagnosis present

## 2020-04-04 DIAGNOSIS — N1831 Chronic kidney disease, stage 3a: Secondary | ICD-10-CM | POA: Diagnosis present

## 2020-04-04 DIAGNOSIS — Z823 Family history of stroke: Secondary | ICD-10-CM

## 2020-04-04 DIAGNOSIS — K3189 Other diseases of stomach and duodenum: Secondary | ICD-10-CM | POA: Diagnosis not present

## 2020-04-04 DIAGNOSIS — K6389 Other specified diseases of intestine: Secondary | ICD-10-CM | POA: Diagnosis not present

## 2020-04-04 DIAGNOSIS — K644 Residual hemorrhoidal skin tags: Secondary | ICD-10-CM | POA: Diagnosis present

## 2020-04-04 DIAGNOSIS — Z800 Family history of malignant neoplasm of digestive organs: Secondary | ICD-10-CM | POA: Diagnosis not present

## 2020-04-04 DIAGNOSIS — K228 Other specified diseases of esophagus: Secondary | ICD-10-CM | POA: Diagnosis not present

## 2020-04-04 DIAGNOSIS — K921 Melena: Secondary | ICD-10-CM | POA: Diagnosis present

## 2020-04-04 DIAGNOSIS — Z8049 Family history of malignant neoplasm of other genital organs: Secondary | ICD-10-CM

## 2020-04-04 DIAGNOSIS — N1832 Chronic kidney disease, stage 3b: Secondary | ICD-10-CM | POA: Diagnosis present

## 2020-04-04 DIAGNOSIS — E7849 Other hyperlipidemia: Secondary | ICD-10-CM | POA: Diagnosis not present

## 2020-04-04 DIAGNOSIS — Z8249 Family history of ischemic heart disease and other diseases of the circulatory system: Secondary | ICD-10-CM | POA: Diagnosis not present

## 2020-04-04 LAB — CBC WITH DIFFERENTIAL/PLATELET
Abs Immature Granulocytes: 0.03 10*3/uL (ref 0.00–0.07)
Basophils Absolute: 0 10*3/uL (ref 0.0–0.1)
Basophils Relative: 0 %
Eosinophils Absolute: 0.1 10*3/uL (ref 0.0–0.5)
Eosinophils Relative: 1 %
HCT: 20.1 % — ABNORMAL LOW (ref 36.0–46.0)
Hemoglobin: 5.7 g/dL — CL (ref 12.0–15.0)
Immature Granulocytes: 0 %
Lymphocytes Relative: 11 %
Lymphs Abs: 0.9 10*3/uL (ref 0.7–4.0)
MCH: 24.3 pg — ABNORMAL LOW (ref 26.0–34.0)
MCHC: 28.4 g/dL — ABNORMAL LOW (ref 30.0–36.0)
MCV: 85.5 fL (ref 80.0–100.0)
Monocytes Absolute: 0.8 10*3/uL (ref 0.1–1.0)
Monocytes Relative: 10 %
Neutro Abs: 6 10*3/uL (ref 1.7–7.7)
Neutrophils Relative %: 78 %
Platelets: 257 10*3/uL (ref 150–400)
RBC: 2.35 MIL/uL — ABNORMAL LOW (ref 3.87–5.11)
RDW: 17.5 % — ABNORMAL HIGH (ref 11.5–15.5)
WBC: 7.8 10*3/uL (ref 4.0–10.5)
nRBC: 0 % (ref 0.0–0.2)

## 2020-04-04 LAB — PROTIME-INR
INR: 1 (ref 0.8–1.2)
Prothrombin Time: 13.2 seconds (ref 11.4–15.2)

## 2020-04-04 LAB — URINALYSIS, ROUTINE W REFLEX MICROSCOPIC
Bilirubin Urine: NEGATIVE
Glucose, UA: NEGATIVE mg/dL
Hgb urine dipstick: NEGATIVE
Ketones, ur: NEGATIVE mg/dL
Nitrite: NEGATIVE
Protein, ur: NEGATIVE mg/dL
Specific Gravity, Urine: 1.008 (ref 1.005–1.030)
WBC, UA: >50 WBC/hpf — ABNORMAL HIGH (ref 0–5)
pH: 8 (ref 5.0–8.0)

## 2020-04-04 LAB — RESPIRATORY PANEL BY RT PCR (FLU A&B, COVID)
Influenza A by PCR: NEGATIVE
Influenza B by PCR: NEGATIVE
SARS Coronavirus 2 by RT PCR: NEGATIVE

## 2020-04-04 LAB — PREPARE RBC (CROSSMATCH)

## 2020-04-04 LAB — HEPATIC FUNCTION PANEL
ALT: 6 U/L (ref 0–44)
AST: 11 U/L — ABNORMAL LOW (ref 15–41)
Albumin: 3.4 g/dL — ABNORMAL LOW (ref 3.5–5.0)
Alkaline Phosphatase: 62 U/L (ref 38–126)
Bilirubin, Direct: <0.1 mg/dL (ref 0.0–0.2)
Total Bilirubin: 0.5 mg/dL (ref 0.3–1.2)
Total Protein: 7 g/dL (ref 6.5–8.1)

## 2020-04-04 LAB — BASIC METABOLIC PANEL
Anion gap: 9 (ref 5–15)
BUN: 36 mg/dL — ABNORMAL HIGH (ref 8–23)
CO2: 26 mmol/L (ref 22–32)
Calcium: 8.7 mg/dL — ABNORMAL LOW (ref 8.9–10.3)
Chloride: 99 mmol/L (ref 98–111)
Creatinine, Ser: 1.59 mg/dL — ABNORMAL HIGH (ref 0.44–1.00)
GFR calc Af Amer: 37 mL/min — ABNORMAL LOW (ref >60)
GFR calc non Af Amer: 32 mL/min — ABNORMAL LOW (ref >60)
Glucose, Bld: 128 mg/dL — ABNORMAL HIGH (ref 70–99)
Potassium: 3 mmol/L — ABNORMAL LOW (ref 3.5–5.1)
Sodium: 134 mmol/L — ABNORMAL LOW (ref 135–145)

## 2020-04-04 LAB — POC OCCULT BLOOD, ED: Fecal Occult Bld: POSITIVE — AB

## 2020-04-04 LAB — MAGNESIUM: Magnesium: 2.4 mg/dL (ref 1.7–2.4)

## 2020-04-04 LAB — ABO/RH: ABO/RH(D): A NEG

## 2020-04-04 MED ORDER — SODIUM CHLORIDE 0.9 % IV SOLN: 10.0000 mL/h | Freq: Once | INTRAVENOUS | Status: DC

## 2020-04-04 MED ORDER — POTASSIUM CHLORIDE 10 MEQ/100ML IV SOLN
10.0000 meq | INTRAVENOUS | Status: AC
  Administered 2020-04-04 (×3): 10 meq via INTRAVENOUS
  Filled 2020-04-04 (×3): qty 100

## 2020-04-04 MED ORDER — ATORVASTATIN CALCIUM 20 MG PO TABS
20.0000 mg | ORAL_TABLET | Freq: Every day | ORAL | Status: DC
  Administered 2020-04-06 – 2020-04-12 (×6): 20 mg via ORAL
  Filled 2020-04-04 (×6): qty 1

## 2020-04-04 MED ORDER — PANTOPRAZOLE SODIUM 40 MG IV SOLR
80.0000 mg | Freq: Once | INTRAVENOUS | Status: AC
  Administered 2020-04-04: 80 mg via INTRAVENOUS
  Filled 2020-04-04: qty 80

## 2020-04-04 MED ORDER — FERROUS SULFATE 325 (65 FE) MG PO TABS
325.0000 mg | ORAL_TABLET | Freq: Two times a day (BID) | ORAL | Status: DC
  Administered 2020-04-05 – 2020-04-12 (×13): 325 mg via ORAL
  Filled 2020-04-04 (×13): qty 1

## 2020-04-04 MED ORDER — ACETAMINOPHEN 650 MG RE SUPP: 650.0000 mg | Freq: Four times a day (QID) | RECTAL | Status: DC | PRN

## 2020-04-04 MED ORDER — DICLOFENAC SODIUM 1 % EX GEL
4.0000 g | Freq: Four times a day (QID) | CUTANEOUS | Status: DC | PRN
  Filled 2020-04-04: qty 100

## 2020-04-04 MED ORDER — PANTOPRAZOLE SODIUM 40 MG IV SOLR
40.0000 mg | INTRAVENOUS | Status: DC
  Administered 2020-04-05 – 2020-04-10 (×6): 40 mg via INTRAVENOUS
  Filled 2020-04-04 (×6): qty 40

## 2020-04-04 MED ORDER — POTASSIUM CHLORIDE CRYS ER 20 MEQ PO TBCR
40.0000 meq | EXTENDED_RELEASE_TABLET | Freq: Once | ORAL | Status: AC
  Administered 2020-04-04: 22:00:00 40 meq via ORAL
  Filled 2020-04-04: qty 2

## 2020-04-04 MED ORDER — SODIUM CHLORIDE 0.9 % IV SOLN
1.0000 g | INTRAVENOUS | Status: AC
  Administered 2020-04-04 – 2020-04-06 (×3): 1 g via INTRAVENOUS
  Filled 2020-04-04 (×3): qty 10

## 2020-04-04 MED ORDER — ONDANSETRON HCL 4 MG/2ML IJ SOLN
4.0000 mg | Freq: Four times a day (QID) | INTRAMUSCULAR | Status: DC | PRN
  Administered 2020-04-05: 22:00:00 4 mg via INTRAVENOUS
  Filled 2020-04-04 (×2): qty 2

## 2020-04-04 MED ORDER — SODIUM CHLORIDE 0.9% FLUSH
3.0000 mL | Freq: Two times a day (BID) | INTRAVENOUS | Status: DC
  Administered 2020-04-05 – 2020-04-12 (×10): 3 mL via INTRAVENOUS

## 2020-04-04 MED ORDER — POLYETHYLENE GLYCOL 3350 17 G PO PACK: 17.0000 g | PACK | Freq: Every day | ORAL | Status: DC | PRN

## 2020-04-04 MED ORDER — ONDANSETRON HCL 4 MG PO TABS: 4.0000 mg | ORAL_TABLET | Freq: Four times a day (QID) | ORAL | Status: DC | PRN

## 2020-04-04 MED ORDER — DORZOLAMIDE HCL-TIMOLOL MAL 2-0.5 % OP SOLN
1.0000 [drp] | Freq: Every day | OPHTHALMIC | Status: DC
  Administered 2020-04-05 – 2020-04-12 (×7): 1 [drp] via OPHTHALMIC
  Filled 2020-04-04: qty 10

## 2020-04-04 MED ORDER — ACETAMINOPHEN 325 MG PO TABS
650.0000 mg | ORAL_TABLET | Freq: Four times a day (QID) | ORAL | Status: DC | PRN
  Administered 2020-04-05: 650 mg via ORAL
  Filled 2020-04-04: qty 2

## 2020-04-04 NOTE — ED Provider Notes (Signed)
Emergency Department Provider Note   I have reviewed the triage vital signs and the nursing notes.   HISTORY  Chief Complaint Weakness   HPI Gabrielle [name] is a 74 y.o. female with PMH of CKD, HTN, and HLD presents to the emergency department for evaluation of progressively worsening generalized weakness with dark bowel movements.  Patient states that she has been to see her primary care doctor in the past week and has had several labs showing decreased hemoglobin.  She does not have a recent history of GI bleeding and does not follow with gastroenterology.  She is not experiencing abdominal or rectal pain.  She is not having fevers, chest pain.  She is feeling fatigued and weak.  The patient's family is at bedside states that she is seemed very low energy and not walking as far as she normally does.  She has noticed "dark" BMs at home but was recently started on iron.  The patient's family at bedside states that her dad recently had GI bleeding and that the BM smell is very similar to when he was bleeding. No radiation of symptoms or modifying factors.    Past Medical History:  Diagnosis Date  . Cataract    OU  . H/O cesarean section   . Hx of tonsillectomy   . Hyperlipidemia   . Hypertension   . Macular degeneration    Exu ARMD OU    Patient Active Problem List   Diagnosis Date Noted  . Symptomatic anemia 04/04/2020  . Anemia 08/28/2019  . High risk for hip fracture 06/18/2019  . Osteopenia after menopause 06/18/2019  . Stage 3 chronic kidney disease 06/18/2019  . Essential hypertension 05/15/2019  . Other hyperlipidemia 05/15/2019    Past Surgical History:  Procedure Laterality Date  . CARPAL TUNNEL RELEASE Right   . CATARACT EXTRACTION W/PHACO Left 10/11/2019   Procedure: CATARACT EXTRACTION PHACO AND INTRAOCULAR LENS PLACEMENT (IOC);  Surgeon: Baruch Goldmann, MD;  Location: [facility];  Service: Ophthalmology;  Laterality: Left;  CDE: 8.56  . CATARACT EXTRACTION  W/PHACO Right 10/25/2019   Procedure: CATARACT EXTRACTION PHACO AND INTRAOCULAR LENS PLACEMENT (IOC);  Surgeon: Baruch Goldmann, MD;  Location: [facility];  Service: Ophthalmology;  Laterality: Right;  CDE: 5.67  . CESAREAN SECTION    . TONSILLECTOMY      Allergies Lisinopril  Family History  Problem Relation Age of Onset  . Macular degeneration Mother   . Stroke Mother   . Hypertension Mother   . Dementia Mother   . Cancer Father        lung  . Diabetes Sister   . Hypertension Sister     Social History Social History   Tobacco Use  . Smoking status: Never Smoker  . Smokeless tobacco: Never Used  Substance Use Topics  . Alcohol use: No  . Drug use: No    Review of Systems  Constitutional: No fever/chills. Positive generalized weakness.  Eyes: No visual changes. ENT: No sore throat. Cardiovascular: Denies chest pain. Respiratory: Denies shortness of breath. Gastrointestinal: No abdominal pain.  No nausea, no vomiting.  No diarrhea.  No constipation. Dark BMs.  Genitourinary: Mild for dysuria. No hesitancy or urgency.  Musculoskeletal: Negative for back pain. Skin: Negative for rash. Neurological: Negative for headaches, focal weakness or numbness.  10-point ROS otherwise negative.  ____________________________________________   PHYSICAL EXAM:  VITAL SIGNS: ED Triage Vitals  Enc Vitals Group     BP 04/04/20 1508 (!) 136/57  Pulse Rate 04/04/20 1508 76     Resp 04/04/20 1508 20     Temp 04/04/20 1508 97.9 F (36.6 C)     Temp src --      SpO2 04/04/20 1508 100 %     Weight 04/04/20 1509 150 lb (68 kg)     Height 04/04/20 1509 5\' 2"  (1.575 m)   Constitutional: Alert and oriented. Well appearing and in no acute distress. Eyes: Conjunctivae are normal.  Head: Atraumatic. Nose: No congestion/rhinnorhea. Mouth/Throat: Mucous membranes are moist.   Neck: No stridor.   Cardiovascular: Normal rate, regular rhythm. Good peripheral circulation. Grossly normal  heart sounds.   Respiratory: Normal respiratory effort.  No retractions. Lungs CTAB. Gastrointestinal: Soft and nontender. No distention.  Rectal exam performed with patient's verbal consent and nurse chaperone.  No external hemorrhoids or fissures.  Patient with scant, dark stool which is hemoccult positive.  Musculoskeletal: No lower extremity tenderness nor edema. No gross deformities of extremities. Neurologic:  Normal speech and language. Skin:  Skin is warm, dry and intact. No rash noted.   ____________________________________________   LABS (all labs ordered are listed, but only abnormal results are displayed)  Labs Reviewed  CBC WITH DIFFERENTIAL/PLATELET - Abnormal; Notable for the following components:      Result Value   RBC 2.35 (*)    Hemoglobin 5.7 (*)    HCT 20.1 (*)    MCH 24.3 (*)    MCHC 28.4 (*)    RDW 17.5 (*)    All other components within normal limits  BASIC METABOLIC PANEL - Abnormal; Notable for the following components:   Sodium 134 (*)    Potassium 3.0 (*)    Glucose, Bld 128 (*)    BUN 36 (*)    Creatinine, Ser 1.59 (*)    Calcium 8.7 (*)    GFR calc non Af Amer 32 (*)    GFR calc Af Amer 37 (*)    All other components within normal limits  HEPATIC FUNCTION PANEL - Abnormal; Notable for the following components:   Albumin 3.4 (*)    AST 11 (*)    All other components within normal limits  POC OCCULT BLOOD, ED - Abnormal; Notable for the following components:   Fecal Occult Bld POSITIVE (*)    All other components within normal limits  RESPIRATORY PANEL BY RT PCR (FLU A&B, COVID)  URINE CULTURE  PROTIME-INR  URINALYSIS, ROUTINE W REFLEX MICROSCOPIC  TYPE AND SCREEN  ABO/RH  PREPARE RBC (CROSSMATCH)   ____________________________________________  RADIOLOGY  None  ____________________________________________   PROCEDURES  Procedure(s) performed:   .Critical Care Performed by: Margette Fast, MD Authorized by: Margette Fast, MD    Critical care provider statement:    Critical care time (minutes):  35   Critical care time was exclusive of:  Separately billable procedures and treating other patients and teaching time   Critical care was necessary to treat or prevent imminent or life-threatening deterioration of the following conditions: GI bleeding requiring blood transfusion.   Critical care was time spent personally by me on the following activities:  Discussions with consultants, evaluation of patient's response to treatment, examination of patient, ordering and performing treatments and interventions, ordering and review of laboratory studies, ordering and review of radiographic studies, pulse oximetry, re-evaluation of patient's condition, obtaining history from patient or surrogate, review of old charts, blood draw for specimens and development of treatment plan with patient or surrogate   I assumed direction  of critical care for this patient from another provider in my specialty: no       ____________________________________________   INITIAL IMPRESSION / ASSESSMENT AND PLAN / ED COURSE  Pertinent labs & imaging results that were available during my care of the patient were reviewed by me and considered in my medical decision making (see chart for details).   Patient presents emergency department for evaluation of generalized weakness.  She appears to have GI blood loss type anemia which may be secondarily complicated by her underlying CKD.  Her hemoglobin here is drifted further and out at 5.7.  We discussed blood transfusion including risks and benefits and patient provides consent to receive the transfusion.  We will add Protonix as well.  Potassium slightly low at 3.0 so plan on replacing this.  We will touch base with GI and transfuse here in the ED.   04:59 PM  Spoke with Dr. Laural Golden to discuss the case.  He recommends continuing as we have started here in the ER.  She can be on clear liquid diet and he will  see her in the morning for EGD tomorrow.   Discussed patient's case with [facility], Dr. Denton Brick to request admission. Patient and family (if present) updated with plan. Care transferred to [facility] service.  I reviewed all nursing notes, vitals, pertinent old records, EKGs, labs, imaging (as available).  ____________________________________________  FINAL CLINICAL IMPRESSION(S) / ED DIAGNOSES  Final diagnoses:  Symptomatic anemia  Gastrointestinal hemorrhage, unspecified gastrointestinal hemorrhage type  Hypokalemia    MEDICATIONS GIVEN DURING THIS VISIT:  Medications  0.9 %  sodium chloride infusion (has no administration in time range)  potassium chloride 10 mEq in 100 mL IVPB (10 mEq Intravenous New Bag/Given 04/04/20 1706)  pantoprazole (PROTONIX) injection 80 mg (80 mg Intravenous Given 04/04/20 1558)    Note:  This document was prepared using Dragon voice recognition software and may include unintentional dictation errors.  Nanda Quinton, MD, [facility]    Jataya Wann, [facility], MD 04/04/20 2121

## 2020-04-04 NOTE — H&P (Addendum)
History and Physical    Gabrielle [name] HER:[id number] DOB: [birth year] DOA: 04/04/2020  PCP: Janora Norlander, DO   Patient coming from: Home  I have personally briefly reviewed patient's old medical records in [place]  Chief Complaint: Weakness  HPI: Gabrielle [name] is a 74 y.o. female with medical history significant for hypertension, CKD 3.  Patient presented to the ED with complaints of generalized weakness over the past 1 to 2 weeks.  She was evaluated by her primary care provider and her hemoglobin was low.  Patient has problems with her vision, so she is not sure if her stools have been black, but her daughter present at bedside confirms patient has been having black stools.  She also reports black stools and overall symptoms started when she started taking her iron pills.  Reports some mild right lower abdominal pain, with intermittent dysuria over the past week.  No vomiting.  She denies NSAID use, she is not on daily aspirin.  ED Course: Stable vitals.  Hemoglobin 5.7 down from 7-  5 days ago. Potassium 3.  2 units PRBC ordered for transfusion. EDP talked to Dr. Laural Golden, clear liquid diet, possible EGD tomorrow.  Review of Systems: As per HPI all other systems reviewed and negative.  Past Medical History:  Diagnosis Date  . Cataract    OU  . H/O cesarean section   . Hx of tonsillectomy   . Hyperlipidemia   . Hypertension   . Macular degeneration    Exu ARMD OU    Past Surgical History:  Procedure Laterality Date  . CARPAL TUNNEL RELEASE Right   . CATARACT EXTRACTION W/PHACO Left 10/11/2019   Procedure: CATARACT EXTRACTION PHACO AND INTRAOCULAR LENS PLACEMENT (IOC);  Surgeon: Baruch Goldmann, MD;  Location: [facility];  Service: Ophthalmology;  Laterality: Left;  CDE: 8.56  . CATARACT EXTRACTION W/PHACO Right 10/25/2019   Procedure: CATARACT EXTRACTION PHACO AND INTRAOCULAR LENS PLACEMENT (IOC);  Surgeon: Baruch Goldmann, MD;  Location: [facility];  Service:  Ophthalmology;  Laterality: Right;  CDE: 5.67  . CESAREAN SECTION    . TONSILLECTOMY       reports that she has never smoked. She has never used smokeless tobacco. She reports that she does not drink alcohol or use drugs.  Allergies  Allergen Reactions  . Lisinopril Cough    Pt states she is not allergic to lisinopril    Family History  Problem Relation Age of Onset  . Macular degeneration Mother   . Stroke Mother   . Hypertension Mother   . Dementia Mother   . Cancer Father        lung  . Diabetes Sister   . Hypertension Sister      Prior to Admission medications   Medication Sig Start Date End Date Taking? Authorizing Provider  alendronate (FOSAMAX) 70 MG tablet Take 1 tablet (70 mg total) by mouth every 7 (seven) days. Take with a full glass of water on an empty stomach. 09/09/19   Janora Norlander, DO  atorvastatin (LIPITOR) 20 MG tablet Take 1 tablet (20 mg total) by mouth daily. 03/20/20   Janora Norlander, DO  cholecalciferol (VITAMIN D3) 25 MCG (1000 UT) tablet Take 1,000 Units by mouth daily.    [provider]  diclofenac Sodium (VOLTAREN) 1 % GEL Apply 4 g topically 4 (four) times daily. 03/30/20   Janora Norlander, DO  dorzolamide-timolol (COSOPT) 22.3-6.8 MG/ML ophthalmic solution Place 1 drop into both eyes daily. 03/30/20  Bernarda Caffey, MD  ferrous sulfate 325 (65 FE) MG EC tablet Take 1 tablet (325 mg total) by mouth 2 (two) times daily with a meal. 06/25/19   Gottschalk, Ashly M, DO  losartan-hydrochlorothiazide (HYZAAR) 100-25 MG tablet TAKE 1 TABLET DAILY.  Needs to be seen for further refills. 03/20/20   Janora Norlander, DO    Physical Exam: Vitals:   04/04/20 1600 04/04/20 1615 04/04/20 1630 04/04/20 1645  BP: (!) 141/52  (!) 128/54   Pulse: 75 71 72 70  Resp:  (!) 25 13 15   Temp:      SpO2: 99% 97% 98% 98%  Weight:      Height:        Constitutional: NAD, calm, comfortable Vitals:   04/04/20 1600 04/04/20 1615 04/04/20 1630  04/04/20 1645  BP: (!) 141/52  (!) 128/54   Pulse: 75 71 72 70  Resp:  (!) 25 13 15   Temp:      SpO2: 99% 97% 98% 98%  Weight:      Height:       Eyes: PERRL, lids and conjunctivae normal ENMT: Mucous membranes are moist. Posterior pharynx clear of any exudate or lesions.  Neck: normal, supple, no masses, no thyromegaly Respiratory: clear to auscultation bilaterally, no wheezing, no crackles. Normal respiratory effort. No accessory muscle use.  Cardiovascular: Regular rate and rhythm, no murmurs / rubs / gallops. No extremity edema. 2+ pedal pulses.  Abdomen: no tenderness, no masses palpated. No hepatosplenomegaly. Bowel sounds positive.  Musculoskeletal: no clubbing / cyanosis. No joint deformity upper and lower extremities. Good ROM, no contractures.  Skin: no rashes, lesions, ulcers. No induration Neurologic: No apparent cranial nerve abnormality, moving all extremities spontaneously. Psychiatric: Normal judgment and insight. Alert and oriented x 3. Normal mood.   Labs on Admission: I have personally reviewed following labs and imaging studies  CBC: Recent Labs  Lab 03/30/20 0926 04/04/20 1519  WBC 7.3 7.8  NEUTROABS 5.3 6.0  HGB 7.0* 5.7*  HCT 23.6* 20.1*  MCV 82 85.5  PLT 230 973   Basic Metabolic Panel: Recent Labs  Lab 04/04/20 1519  NA 134*  K 3.0*  CL 99  CO2 26  GLUCOSE 128*  BUN 36*  CREATININE 1.59*  CALCIUM 8.7*   Liver Function Tests: Recent Labs  Lab 04/04/20 1519  AST 11*  ALT 6  ALKPHOS 62  BILITOT 0.5  PROT 7.0  ALBUMIN 3.4*   Coagulation Profile: Recent Labs  Lab 04/04/20 1519  INR 1.0    Radiological Exams on Admission: No results found.  EKG: None.  Assessment/Plan Active Problems:   Symptomatic anemia   Symptomatic anemia- hemoglobin 5.7, likely from upper GI bleed.  Stool FOBT positive.  Denies NSAID use. - EDP talked to Dr. Laural Golden, will see in a.m -Transfuse 2 units PRBC -Monitor hemoglobin closely -IV Protonix 40  every 12 hourly -Clear liquid diet, n.p.o. midnight  Hypokalemia-potassium 3, likely from HCTZ. -Replete -BMP a.m.  Urinary tract infection-reports dysuria, mild lower right abdominal pain. Rules out for sepsis. Afebrile. WBC 7.8. UA suggestive of UTI with large leukocytes. -Follow-up urine cultures -IV ceftriaxone 1 g daily  Hypertension-stable. -Hold losartan-HCTZ in the setting of acute GI bleed  CKD 3-creatinine 1.16.  Stable.  DVT prophylaxis: SCds Code Status: Full code. Family Communication: Daughter at bedside. Disposition Plan:  1- 2 days Consults called: Dr. Laural Golden. Admission status: Obs, tele    Bethena Roys MD [facility]  04/04/2020, 7:13 PM

## 2020-04-04 NOTE — ED Triage Notes (Signed)
States she was advised by her PCP that her hemoglobin is low, states she feel weak

## 2020-04-04 NOTE — ED Notes (Signed)
Date and time results received: 04/04/20 3:44 PM   Test: 5.7 Critical Value: hgb  Name of Provider Notified: Dr Laverta Baltimore  Orders Received? Or Actions Taken?:

## 2020-04-05 ENCOUNTER — Encounter (HOSPITAL_COMMUNITY)
Admission: EM | Disposition: A | Discharge status: Home Health | Payer: Self-pay | Source: Home / Self Care | Attending: Internal Medicine

## 2020-04-05 ENCOUNTER — Encounter (HOSPITAL_COMMUNITY): Payer: Self-pay | Admitting: Family Medicine

## 2020-04-05 DIAGNOSIS — I10 Essential (primary) hypertension: Secondary | ICD-10-CM | POA: Diagnosis not present

## 2020-04-05 DIAGNOSIS — Z808 Family history of malignant neoplasm of other organs or systems: Secondary | ICD-10-CM | POA: Diagnosis not present

## 2020-04-05 DIAGNOSIS — Z8049 Family history of malignant neoplasm of other genital organs: Secondary | ICD-10-CM | POA: Diagnosis not present

## 2020-04-05 DIAGNOSIS — Z8249 Family history of ischemic heart disease and other diseases of the circulatory system: Secondary | ICD-10-CM | POA: Diagnosis not present

## 2020-04-05 DIAGNOSIS — Z833 Family history of diabetes mellitus: Secondary | ICD-10-CM | POA: Diagnosis not present

## 2020-04-05 DIAGNOSIS — D649 Anemia, unspecified: Secondary | ICD-10-CM | POA: Diagnosis not present

## 2020-04-05 DIAGNOSIS — Z7983 Long term (current) use of bisphosphonates: Secondary | ICD-10-CM | POA: Diagnosis not present

## 2020-04-05 DIAGNOSIS — K228 Other specified diseases of esophagus: Secondary | ICD-10-CM | POA: Diagnosis not present

## 2020-04-05 DIAGNOSIS — E7849 Other hyperlipidemia: Secondary | ICD-10-CM

## 2020-04-05 DIAGNOSIS — E876 Hypokalemia: Secondary | ICD-10-CM | POA: Diagnosis present

## 2020-04-05 DIAGNOSIS — H3530 Unspecified macular degeneration: Secondary | ICD-10-CM | POA: Diagnosis present

## 2020-04-05 DIAGNOSIS — M8580 Other specified disorders of bone density and structure, unspecified site: Secondary | ICD-10-CM

## 2020-04-05 DIAGNOSIS — Z888 Allergy status to other drugs, medicaments and biological substances status: Secondary | ICD-10-CM | POA: Diagnosis not present

## 2020-04-05 DIAGNOSIS — B9681 Helicobacter pylori [H. pylori] as the cause of diseases classified elsewhere: Secondary | ICD-10-CM | POA: Diagnosis present

## 2020-04-05 DIAGNOSIS — Z800 Family history of malignant neoplasm of digestive organs: Secondary | ICD-10-CM | POA: Diagnosis not present

## 2020-04-05 DIAGNOSIS — K3189 Other diseases of stomach and duodenum: Secondary | ICD-10-CM | POA: Diagnosis not present

## 2020-04-05 DIAGNOSIS — E785 Hyperlipidemia, unspecified: Secondary | ICD-10-CM | POA: Diagnosis present

## 2020-04-05 DIAGNOSIS — C182 Malignant neoplasm of ascending colon: Secondary | ICD-10-CM | POA: Diagnosis present

## 2020-04-05 DIAGNOSIS — K921 Melena: Secondary | ICD-10-CM | POA: Diagnosis present

## 2020-04-05 DIAGNOSIS — K644 Residual hemorrhoidal skin tags: Secondary | ICD-10-CM | POA: Diagnosis present

## 2020-04-05 DIAGNOSIS — I129 Hypertensive chronic kidney disease with stage 1 through stage 4 chronic kidney disease, or unspecified chronic kidney disease: Secondary | ICD-10-CM | POA: Diagnosis present

## 2020-04-05 DIAGNOSIS — D500 Iron deficiency anemia secondary to blood loss (chronic): Secondary | ICD-10-CM | POA: Diagnosis present

## 2020-04-05 DIAGNOSIS — Z801 Family history of malignant neoplasm of trachea, bronchus and lung: Secondary | ICD-10-CM | POA: Diagnosis not present

## 2020-04-05 DIAGNOSIS — N3289 Other specified disorders of bladder: Secondary | ICD-10-CM | POA: Diagnosis not present

## 2020-04-05 DIAGNOSIS — N1832 Chronic kidney disease, stage 3b: Secondary | ICD-10-CM | POA: Diagnosis present

## 2020-04-05 DIAGNOSIS — K319 Disease of stomach and duodenum, unspecified: Secondary | ICD-10-CM | POA: Diagnosis present

## 2020-04-05 DIAGNOSIS — Z823 Family history of stroke: Secondary | ICD-10-CM | POA: Diagnosis not present

## 2020-04-05 DIAGNOSIS — N390 Urinary tract infection, site not specified: Secondary | ICD-10-CM | POA: Diagnosis present

## 2020-04-05 DIAGNOSIS — Z20822 Contact with and (suspected) exposure to covid-19: Secondary | ICD-10-CM | POA: Diagnosis present

## 2020-04-05 DIAGNOSIS — H269 Unspecified cataract: Secondary | ICD-10-CM | POA: Diagnosis present

## 2020-04-05 DIAGNOSIS — K922 Gastrointestinal hemorrhage, unspecified: Secondary | ICD-10-CM | POA: Diagnosis not present

## 2020-04-05 DIAGNOSIS — R310 Gross hematuria: Secondary | ICD-10-CM | POA: Diagnosis not present

## 2020-04-05 HISTORY — PX: ESOPHAGOGASTRODUODENOSCOPY: SHX5428

## 2020-04-05 LAB — GLUCOSE, CAPILLARY: Glucose-Capillary: 85 mg/dL (ref 70–99)

## 2020-04-05 LAB — BASIC METABOLIC PANEL
Anion gap: 8 (ref 5–15)
BUN: 35 mg/dL — ABNORMAL HIGH (ref 8–23)
CO2: 26 mmol/L (ref 22–32)
Calcium: 8.5 mg/dL — ABNORMAL LOW (ref 8.9–10.3)
Chloride: 103 mmol/L (ref 98–111)
Creatinine, Ser: 1.51 mg/dL — ABNORMAL HIGH (ref 0.44–1.00)
GFR calc Af Amer: 39 mL/min — ABNORMAL LOW (ref >60)
GFR calc non Af Amer: 34 mL/min — ABNORMAL LOW (ref >60)
Glucose, Bld: 107 mg/dL — ABNORMAL HIGH (ref 70–99)
Potassium: 3.7 mmol/L (ref 3.5–5.1)
Sodium: 137 mmol/L (ref 135–145)

## 2020-04-05 LAB — CBC
HCT: 24.3 % — ABNORMAL LOW (ref 36.0–46.0)
Hemoglobin: 7.1 g/dL — ABNORMAL LOW (ref 12.0–15.0)
MCH: 25.1 pg — ABNORMAL LOW (ref 26.0–34.0)
MCHC: 29.2 g/dL — ABNORMAL LOW (ref 30.0–36.0)
MCV: 85.9 fL (ref 80.0–100.0)
Platelets: 232 10*3/uL (ref 150–400)
RBC: 2.83 MIL/uL — ABNORMAL LOW (ref 3.87–5.11)
RDW: 16.5 % — ABNORMAL HIGH (ref 11.5–15.5)
WBC: 6.4 10*3/uL (ref 4.0–10.5)
nRBC: 0 % (ref 0.0–0.2)

## 2020-04-05 SURGERY — EGD (ESOPHAGOGASTRODUODENOSCOPY)
Anesthesia: Moderate Sedation

## 2020-04-05 MED ORDER — SODIUM CHLORIDE FLUSH 0.9 % IV SOLN
INTRAVENOUS | Status: AC
  Filled 2020-04-05: qty 10

## 2020-04-05 MED ORDER — LIDOCAINE VISCOUS HCL 2 % MT SOLN
OROMUCOSAL | Status: AC
  Filled 2020-04-05: qty 15

## 2020-04-05 MED ORDER — MIDAZOLAM HCL 5 MG/5ML IJ SOLN
INTRAMUSCULAR | Status: AC
  Filled 2020-04-05: qty 10

## 2020-04-05 MED ORDER — PEG 3350-KCL-NA BICARB-NACL 420 G PO SOLR
4000.0000 mL | Freq: Once | ORAL | Status: AC
  Administered 2020-04-05: 4000 mL via ORAL
  Filled 2020-04-05: qty 4000

## 2020-04-05 MED ORDER — MEPERIDINE HCL 50 MG/ML IJ SOLN
INTRAMUSCULAR | Status: DC | PRN
  Administered 2020-04-05: 20 mg via INTRAVENOUS
  Administered 2020-04-05: 30 mg via INTRAVENOUS

## 2020-04-05 MED ORDER — LIDOCAINE VISCOUS HCL 2 % MT SOLN
OROMUCOSAL | Status: DC | PRN
  Administered 2020-04-05: 1 via OROMUCOSAL

## 2020-04-05 MED ORDER — CHLORHEXIDINE GLUCONATE CLOTH 2 % EX PADS
6.0000 | MEDICATED_PAD | Freq: Every day | CUTANEOUS | Status: DC
  Administered 2020-04-06 – 2020-04-12 (×5): 6 via TOPICAL

## 2020-04-05 MED ORDER — MEPERIDINE HCL 50 MG/ML IJ SOLN
INTRAMUSCULAR | Status: AC
  Filled 2020-04-05: qty 1

## 2020-04-05 MED ORDER — MIDAZOLAM HCL 5 MG/5ML IJ SOLN
INTRAMUSCULAR | Status: DC | PRN
  Administered 2020-04-05: 2 mg via INTRAVENOUS

## 2020-04-05 NOTE — Progress Notes (Signed)
Patient only received 1 unit of prbc, unit was left out too long. Accidentally marked completed. Patient still needs 1 unit at this time. Lab has ordered another unit at this time.

## 2020-04-05 NOTE — Progress Notes (Signed)
Bladder scanned patient per Dr Laural Golden. Bladder scan showed greater than 999cc of urine. In and out catheter order placed by Dr Laural Golden. In and out cath performed, sterile technique maintained. Returned urine was 2100cc. Patient tolerated well. Will continue to assess throughout shift.

## 2020-04-05 NOTE — Progress Notes (Signed)
Dr. Laural Golden called to check on patient. I reported to him that She had nearly finished her golytely, had 2 bowel movements that were not quite clear, and had one episode of emesis about 30 mins prior to our phone call. He said that the patient did not have to finish the golytely, and that her colonoscopy was scheduled for 0730 in the morning.

## 2020-04-05 NOTE — Op Note (Signed)
[facility] Patient Name: Gabrielle [name] Procedure Date: 04/05/2020 1:40 PM MRN: [id number] Date of Birth: 09-06-1946 Attending MD: Hildred Laser , MD CSN: [id number] Age: 74 Admit Type: Inpatient Procedure:                Upper GI endoscopy Indications:              Iron deficiency anemia secondary to chronic blood                            loss, Melena Providers:                Hildred Laser, MD, Lurline Del, RN, Bonnetta Barry,                            Technician Referring MD:             Irwin Brakeman, MD Medicines:                Lidocaine jelly, Meperidine 50 mg IV, Midazolam 3                            mg IV Complications:            No immediate complications. Estimated Blood Loss:     Estimated blood loss: none. Procedure:                Pre-Anesthesia Assessment:                           - Prior to the procedure, a History and Physical                            was performed, and patient medications and                            allergies were reviewed. The patient's tolerance of                            previous anesthesia was also reviewed. The risks                            and benefits of the procedure and the sedation                            options and risks were discussed with the patient.                            All questions were answered, and informed consent                            was obtained. Prior Anticoagulants: The patient has                            taken no previous anticoagulant or antiplatelet  agents. ASA Grade Assessment: II - A patient with                            mild systemic disease. After reviewing the risks                            and benefits, the patient was deemed in                            satisfactory condition to undergo the procedure.                           After obtaining informed consent, the endoscope was                            passed under direct vision. Throughout  the                            procedure, the patient's blood pressure, pulse, and                            oxygen saturations were monitored continuously. The                            [id number]-[id number] ([id number]) scope was introduced through the                            mouth, and advanced to the second part of duodenum.                            The upper GI endoscopy was accomplished without                            difficulty. The patient tolerated the procedure                            well. Scope In: 1:48:12 PM Scope Out: 1:54:05 PM Total Procedure Duration: 0 hours 5 minutes 53 seconds  Findings:      The hypopharynx was normal.      The examined esophagus was normal.      The Z-line was irregular and was found 37 cm from the incisors.      A few erosions with no stigmata of recent bleeding were found in the       gastric antrum and in the prepyloric region of the stomach.      The exam of the stomach was otherwise normal.      The duodenal bulb and second portion of the duodenum were normal. Impression:               - Normal hypopharynx.                           - Normal esophagus.                           -  Z-line irregular, 37 cm from the incisors.                           - Erosive gastropathy with no stigmata of recent                            bleeding.                           - Normal duodenal bulb and second portion of the                            duodenum.                           - No specimens collected. Moderate Sedation:      Moderate (conscious) sedation was administered by the endoscopy nurse       and supervised by the endoscopist. The following parameters were       monitored: oxygen saturation, heart rate, blood pressure, CO2       capnography and response to care. Total physician intraservice time was       11 minutes. Recommendation:           - Return patient to hospital ward for ongoing care.                           - Clear liquid diet  today.                           - Continue present medications.                           - H.Pylori serology.                           - Perform a colonoscopy tomorrow. Procedure Code(s):        --- Professional ---                           [phone], Esophagogastroduodenoscopy, flexible,                            transoral; diagnostic, including collection of                            specimen(s) by brushing or washing, when performed                            (separate procedure)                           G0500, Moderate sedation services provided by the                            same physician or other qualified health care  professional performing a gastrointestinal                            endoscopic service that sedation supports,                            requiring the presence of an independent trained                            observer to assist in the monitoring of the                            patient's level of consciousness and physiological                            status; initial 15 minutes of intra-service time;                            patient age 66 years or older (additional time may                            be reported with [phone], as appropriate) Diagnosis Code(s):        --- Professional ---                           K22.8, Other specified diseases of esophagus                           K31.89, Other diseases of stomach and duodenum                           D50.0, Iron deficiency anemia secondary to blood                            loss (chronic)                           K92.1, Melena (includes Hematochezia) CPT copyright 2019 [employer]. All rights reserved. The codes documented in this report are preliminary and upon coder review may  be revised to meet current compliance requirements. Hildred Laser, MD Hildred Laser, MD 04/05/2020 2:15:23 PM This report has been signed electronically. Number of Addenda:  0

## 2020-04-05 NOTE — Progress Notes (Deleted)
   04/05/20 0256  Transfuse RBC  Blood Product Cannot Be Scanned Create an Unmatched Blood Product Note - Click on the link (where row information displays) to go directly to note

## 2020-04-05 NOTE — Consult Note (Addendum)
Referring Provider: Irwin Brakeman, MD Primary Care Physician:  Janora Norlander, DO Primary Gastroenterologist:  Dr. Laural Golden  Reason for Consultation:    Melena and anemia  HPI:   Patient is 73 year old Caucasian female who has been experiencing lightheadedness for about 2 weeks.  She was seen by her primary care physician Dr. Adam Phenix who ordered lab studies.  Patient's hemoglobin was 7.2 g.  GI consultation was arranged and she has an appointment to be seen at [facility] on May 14.  She had iron studies on 03/25/2020 revealing serum iron of 18 TIBC of 336 saturation of 5% and ferritin was low at 17.  B12 and folate levels were normal.  In the meantime she developed weakness.  She was advised to come to emergency room which she did yesterday.  Her hemoglobin was 5.7 g.  Her stool was noted to be melanic and heme positive.  Patient has macular degeneration and unable to tell me if she has had rectal bleeding or melena.  Her daughter Ashok Norris who is at bedside tells me that on Tuesday night(03/31/2020) she smells of melena.  Patient was up that night and had multiple bowel movements and not since then.  She has noted mild intermittent epigastric pain.  She has not experienced nausea vomiting heartburn or dysphagia but her appetite has been fair and she may have lost few pounds.  No history of hematuria or vaginal bleeding. Patient has received 1 unit of PRBCs.  She feels better.  She states she was able to walk to the bathroom without getting lightheaded.  She is to receive another unit of PRBCs. Patient has been on Fosamax for about 2 years.  She does not remember having any difficulty swallowing this pill.  No history of peptic ulcer disease.  She does not take OTC NSAIDs. Patient has never been screened for colorectal carcinoma. Patient also found to have abnormal urinalysis consistent with UTI and she was begun on ceftriaxone. Patient also begun on IV  pantoprazole.  Patient is widowed.  She lost her husband 6 months ago because of lung carcinoma.  She lives alone.  Her grandson is there often and spends every other weekend with her.  Her daughter and 2 sons visit as often as they could.  Patient does not drive anymore.  She is able to do usual household work but does not prepare meals anymore.  She is retired 6 years ago.  She worked in [employer] for 30 years.  She has never smoked cigarettes or drank alcohol.  Family history significant for lung carcinoma in her father who is 39 at the time of diagnosis and died a year later.  Her mother is 2 years old and is in a nursing home because of her memory issues.  She has 2 sisters.  One sister is diabetic.  She is 48 years old.  Her other sister is 34 apparently in good health.  Her brother is 9 years old and also in good health.    Past Medical History:  Diagnosis Date  . Cataract    OU  . H/O cesarean section   . Hx of tonsillectomy   . Hyperlipidemia   . Hypertension   . Macular degeneration    Exu ARMD OU       CKD.  Past Surgical History:  Procedure Laterality Date  . CARPAL TUNNEL RELEASE Right   . CATARACT EXTRACTION W/PHACO Left 10/11/2019   Procedure: CATARACT EXTRACTION PHACO AND INTRAOCULAR LENS PLACEMENT (IOC);  Surgeon: Baruch Goldmann, MD;  Location: [facility];  Service: Ophthalmology;  Laterality: Left;  CDE: 8.56  . CATARACT EXTRACTION W/PHACO Right 10/25/2019   Procedure: CATARACT EXTRACTION PHACO AND INTRAOCULAR LENS PLACEMENT (IOC);  Surgeon: Baruch Goldmann, MD;  Location: [facility];  Service: Ophthalmology;  Laterality: Right;  CDE: 5.67  . CESAREAN SECTION    . TONSILLECTOMY      Prior to Admission medications   Medication Sig Start Date End Date Taking? Authorizing Provider  alendronate (FOSAMAX) 70 MG tablet Take 1 tablet (70 mg total) by mouth every 7 (seven) days. Take with a full glass of water on an empty stomach. 09/09/19  Yes Gottschalk, Leatrice Jewels M, DO  atorvastatin  (LIPITOR) 20 MG tablet Take 1 tablet (20 mg total) by mouth daily. 03/20/20  Yes Ronnie Doss M, DO  cholecalciferol (VITAMIN D3) 25 MCG (1000 UT) tablet Take 1,000 Units by mouth daily.   Yes [provider]  diclofenac Sodium (VOLTAREN) 1 % GEL Apply 4 g topically 4 (four) times daily. 03/30/20  Yes Gottschalk, Ashly M, DO  dorzolamide-timolol (COSOPT) 22.3-6.8 MG/ML ophthalmic solution Place 1 drop into both eyes daily. 03/30/20  Yes Bernarda Caffey, MD  ferrous sulfate 325 (65 FE) MG EC tablet Take 1 tablet (325 mg total) by mouth 2 (two) times daily with a meal. 06/25/19  Yes Gottschalk, Ashly M, DO  losartan-hydrochlorothiazide (HYZAAR) 100-25 MG tablet TAKE 1 TABLET DAILY.  Needs to be seen for further refills. 03/20/20  Yes Ronnie Doss M, DO    Current Facility-Administered Medications  Medication Dose Route Frequency Provider Last Rate Last Admin  . 0.9 %  sodium chloride infusion  10 mL/hr Intravenous Once Emokpae, Ejiroghene E, MD      . acetaminophen (TYLENOL) tablet 650 mg  650 mg Oral Q6H PRN Emokpae, Ejiroghene E, MD       Or  . acetaminophen (TYLENOL) suppository 650 mg  650 mg Rectal Q6H PRN Emokpae, Ejiroghene E, MD      . atorvastatin (LIPITOR) tablet 20 mg  20 mg Oral Daily Emokpae, Ejiroghene E, MD      . cefTRIAXone (ROCEPHIN) 1 g in sodium chloride 0.9 % 100 mL IVPB  1 g Intravenous Q24H Emokpae, Ejiroghene E, MD   Stopped at 04/04/20 2104  . diclofenac Sodium (VOLTAREN) 1 % topical gel 4 g  4 g Topical QID PRN Emokpae, Ejiroghene E, MD      . dorzolamide-timolol (COSOPT) 22.3-6.8 MG/ML ophthalmic solution 1 drop  1 drop Both Eyes Daily Emokpae, Ejiroghene E, MD   1 drop at 04/05/20 0755  . ferrous sulfate tablet 325 mg  325 mg Oral BID WC Emokpae, Ejiroghene E, MD      . ondansetron (ZOFRAN) tablet 4 mg  4 mg Oral Q6H PRN Emokpae, Ejiroghene E, MD       Or  . ondansetron (ZOFRAN) injection 4 mg  4 mg Intravenous Q6H PRN Emokpae, Ejiroghene E, MD      .  pantoprazole (PROTONIX) injection 40 mg  40 mg Intravenous Q24H Emokpae, Ejiroghene E, MD   40 mg at 04/05/20 0754  . polyethylene glycol (MIRALAX / GLYCOLAX) packet 17 g  17 g Oral Daily PRN Emokpae, Ejiroghene E, MD      . sodium chloride flush (NS) 0.9 % injection 3 mL  3 mL Intravenous Q12H Emokpae, Ejiroghene E, MD   3 mL at 04/05/20 0755    Allergies as of 04/04/2020 - Review Complete 04/04/2020  Allergen Reaction Noted  .  Lisinopril Cough 01/25/2018    Family History  Problem Relation Age of Onset  . Macular degeneration Mother   . Stroke Mother   . Hypertension Mother   . Dementia Mother   . Cancer Father        lung  . Diabetes Sister   . Hypertension Sister     Social History   Socioeconomic History  . Marital status: Divorced    Spouse name: Not on file  . Number of children: Not on file  . Years of education: Not on file  . Highest education level: Not on file  Occupational History  . Not on file  Tobacco Use  . Smoking status: Never Smoker  . Smokeless tobacco: Never Used  Substance and Sexual Activity  . Alcohol use: No  . Drug use: No  . Sexual activity: Not Currently  Other Topics Concern  . Not on file  Social History Narrative  . Not on file   Social Determinants of Health   Financial Resource Strain:   . Difficulty of Paying Living Expenses:   Food Insecurity:   . Worried About Charity fundraiser in the Last Year:   . Arboriculturist in the Last Year:   Transportation Needs:   . Film/video editor (Medical):   Marland Kitchen Lack of Transportation (Non-Medical):   Physical Activity:   . Days of Exercise per Week:   . Minutes of Exercise per Session:   Stress:   . Feeling of Stress :   Social Connections:   . Frequency of Communication with Friends and Family:   . Frequency of Social Gatherings with Friends and Family:   . Attends Religious Services:   . Active Member of Clubs or Organizations:   . Attends Archivist Meetings:   Marland Kitchen  Marital Status:   Intimate Partner Violence:   . Fear of Current or Ex-Partner:   . Emotionally Abused:   Marland Kitchen Physically Abused:   . Sexually Abused:     Review of Systems: See HPI, otherwise normal ROS  Physical Exam: Temp:  [97.1 F (36.2 C)-97.9 F (36.6 C)] 97.4 F (36.3 C) (05/02 0534) Pulse Rate:  [66-78] 71 (05/02 0534) Resp:  [13-25] 16 (05/02 0534) BP: (110-159)/(39-96) 110/53 (05/02 0534) SpO2:  [97 %-100 %] 98 % (05/02 0534) Weight:  [64.6 kg-68 kg] 64.6 kg (05/01 2118) Last BM Date: 04/03/20  Patient is alert.  She has mild hearing impairment. She appears pale. Conjunctivae is also pale and sclerae nonicteric. Oropharyngeal mucosa is normal.  She has upper lower dentures in place. Neck without masses thyromegaly or lymphadenopathy. Cardiac exam with regular rhythm normal S1 and S2.  No murmur or gallop noted. Lungs are clear to auscultation. Abdomen is symmetrical.  Bowel sounds are normal.  On palpation abdomen is soft.  She has mass in hypogastric region which appears to be distended bladder.  Patient does not feel that she has to go to the bathroom. No peripheral edema clubbing or koilonychia noted.  Changes of DJD/osteoarthrosis involving distal interphalangeal joints.  Bladder scan was performed and she had 1 L of urine in bladder. In and out cath to be done.   Lab Results: Recent Labs    04/04/20 1519 04/05/20 0618  WBC 7.8 6.4  HGB 5.7* 7.1*  HCT 20.1* 24.3*  PLT 257 232   BMET Recent Labs    04/04/20 1519 04/05/20 0618  NA 134* 137  K 3.0* 3.7  CL 99 103  CO2 26 26  GLUCOSE 128* 107*  BUN 36* 35*  CREATININE 1.59* 1.51*  CALCIUM 8.7* 8.5*   LFT Recent Labs    04/04/20 1519  PROT 7.0  ALBUMIN 3.4*  AST 11*  ALT 6  ALKPHOS 45  BILITOT 0.5  BILIDIR <0.1  IBILI NOT CALCULATED   PT/INR Recent Labs    04/04/20 1519  LABPROT 13.2  INR 1.0    Assessment;  Patient is 74 year old Caucasian female with prediabetes,  hypertension and chronic kidney disease who presents with history of melena and has iron deficiency anemia.  She does not take OTC NSAIDs.  She is on alendronate but does not have any esophageal symptoms.  She has never been screened for CRC.  She has received 1 unit of PRBCs and is supposed to get another unit.  Her hemoglobin has increased from 5.7 g to 7.1.  She is hemodynamically stable. Given history of melena need to rule out lesion in upper GI tract before considering further work-up.  No matter what she needs to undergo colonoscopy at some point.  Distended bladder.  In and out cath to be performed.  I wonder if she has neurogenic bladder.  Urinary tract infection.  Recommendations;  Diagnostic esophagogastroduodenoscopy with conscious sedation later today.  I have reviewed the procedure risks with the patient and her daughter Marcie Bal who is at bedside and they are both agreeable. If EGD is negative we will proceed with diagnostic colonoscopy in a.m. If she has bleeding lesion on EGD would delay colonoscopy for few weeks.   LOS: 0 days   Deshaun Weisinger  04/05/2020, 10:26 AM

## 2020-04-05 NOTE — Progress Notes (Signed)
MD placed order for foley catheter at 1059. Foley catheter inserted with two nurses and a tech at bedside at 1109 per order.  Sterile technique maintained and patient tolerated well.

## 2020-04-05 NOTE — Progress Notes (Signed)
Brief EGD note  Normal hypopharynx. Normal mucosa of the esophagus. GE junction at 37 cm from incisors. Few antral/prepyloric erosions without stigmata of bleed. Normal bulbar and post bulbar mucosa.

## 2020-04-05 NOTE — Progress Notes (Signed)
PROGRESS NOTE   Gabrielle [name]  TIR:[id number] DOB: 26-Mar-1946 DOA: 04/04/2020 PCP: Janora Norlander, DO   Chief Complaint  Patient presents with  . Weakness    Brief Narrative:  74 year old female with hypertension CKD stage III, cataracts and poor vision acuity presented with symptomatic anemia.  Patient is status post 2 units PRBC transfusion.  Assessment & Plan:   Principal Problem:   Symptomatic anemia Active Problems:   Essential hypertension   Other hyperlipidemia   Osteopenia after menopause   Stage 3 chronic kidney disease   Macular degeneration   1. Symptomatic anemia-patient is now status post 2 units PRBC.  She had an EGD with Dr. Laural Golden today but no source of bleeding was found.  They are planning to do a colonoscopy tomorrow morning.  She will be prepped tonight for the procedure.  Further recommendations to follow. 2. Severe iron deficiency anemia-chronic blood loss anemia-patient has been given 2 units of PRBC transfusion which will provide a significant iron load.  Colonoscopy scheduled for tomorrow to rule out colon cancer. 3. UTI-continue ceftriaxone IV. 4. Acute urinary retention-developed acute urinary retention requiring I/O cath and with procedures being planned opted to place a Foley catheter.  She will need a voiding trial prior to discharge and urology follow-up. 5. Macular degeneration-patient reports she is followed closely by her ophthalmologist who is giving her treatments for this.  DVT prophylaxis: SCDs Code Status: Full Family Communication: Spoke with daughter by telephone for update Disposition:   Status is: Inpatient  Remains inpatient appropriate because:Ongoing diagnostic testing needed not appropriate for outpatient work up   Dispo: The patient is from: Home              Anticipated d/c is to: Home              Anticipated d/c date is: 2 days              Patient currently is not medically stable to d/c.   Consultants:    GI  Procedures:   EGD 04/05/20  Antimicrobials: ceftriaxone     Subjective: Patient reports that she tolerated the first unit of blood very well.  She still remains fairly weak.  She denies having any shortness of breath or chest pain symptoms.  She reports that her vision is impaired due to macular degeneration.  She reports that she is treated by a very good ophthalmologist.  She is followed closely by him.  Objective: Vitals:   04/05/20 1428 04/05/20 1429 04/05/20 1430 04/05/20 1435  BP: (!) 119/58  (!) 121/55   Pulse: 69 66 66 66  Resp: 12 13 13 17   Temp: 97.6 F (36.4 C)     TempSrc: Oral     SpO2: 99% 98% 98% 98%  Weight:      Height:        Intake/Output Summary (Last 24 hours) at 04/05/2020 1512 Last data filed at 04/05/2020 1034 Gross per 24 hour  Intake 666.73 ml  Output 2100 ml  Net -1433.27 ml   Filed Weights   04/04/20 1509 04/04/20 2118  Weight: 68 kg 64.6 kg    Examination:  General exam: Appears calm and comfortable.  Functional blindness. Respiratory system: Clear to auscultation. Respiratory effort normal. Cardiovascular system: S1 & S2 heard, RRR. No JVD, murmurs, rubs, gallops or clicks. No pedal edema. Gastrointestinal system: Abdomen is nondistended, soft and nontender. No organomegaly or masses felt. Normal bowel sounds heard. Central nervous system: Alert and oriented.  No focal neurological deficits. Extremities: Symmetric 5 x 5 power. Skin: No rashes, lesions or ulcers Psychiatry: Judgement and insight appear normal. Mood & affect appropriate.   Data Reviewed: I have personally reviewed following labs and imaging studies  CBC: Recent Labs  Lab 03/30/20 0926 04/04/20 1519 04/05/20 0618  WBC 7.3 7.8 6.4  NEUTROABS 5.3 6.0  --   HGB 7.0* 5.7* 7.1*  HCT 23.6* 20.1* 24.3*  MCV 82 85.5 85.9  PLT 230 257 400    Basic Metabolic Panel: Recent Labs  Lab 04/04/20 1519 04/05/20 0618  NA 134* 137  K 3.0* 3.7  CL 99 103  CO2 26 26   GLUCOSE 128* 107*  BUN 36* 35*  CREATININE 1.59* 1.51*  CALCIUM 8.7* 8.5*  MG 2.4  --     GFR: Estimated Creatinine Clearance: 30 mL/min (A) (by C-G formula based on SCr of 1.51 mg/dL (H)).  Liver Function Tests: Recent Labs  Lab 04/04/20 1519  AST 11*  ALT 6  ALKPHOS 62  BILITOT 0.5  PROT 7.0  ALBUMIN 3.4*    CBG: No results for input(s): GLUCAP in the last 168 hours.   Recent Results (from the past 240 hour(s))  Respiratory Panel by RT PCR (Flu A&B, Covid) - Nasopharyngeal Swab     Status: None   Collection Time: 04/04/20  3:37 PM   Specimen: Nasopharyngeal Swab  Result Value Ref Range Status   SARS Coronavirus 2 by RT PCR NEGATIVE NEGATIVE Final    Comment: (NOTE) SARS-CoV-2 target nucleic acids are NOT DETECTED. The SARS-CoV-2 RNA is generally detectable in upper respiratoy specimens during the acute phase of infection. The lowest concentration of SARS-CoV-2 viral copies this assay can detect is 131 copies/mL. A negative result does not preclude SARS-Cov-2 infection and should not be used as the sole basis for treatment or other patient management decisions. A negative result may occur with  improper specimen collection/handling, submission of specimen other than nasopharyngeal swab, presence of viral mutation(s) within the areas targeted by this assay, and inadequate number of viral copies (<131 copies/mL). A negative result must be combined with clinical observations, patient history, and epidemiological information. The expected result is Negative. Fact Sheet for Patients:  PinkCheek.be Fact Sheet for Healthcare Providers:  GravelBags.it This test is not yet ap proved or cleared by the [place] FDA and  has been authorized for detection and/or diagnosis of SARS-CoV-2 by FDA under an Emergency Use Authorization (EUA). This EUA will remain  in effect (meaning this test can be used) for the  duration of the COVID-19 declaration under Section 564(b)(1) of the Act, 21 U.S.C. section 360bbb-3(b)(1), unless the authorization is terminated or revoked sooner.    Influenza A by PCR NEGATIVE NEGATIVE Final   Influenza B by PCR NEGATIVE NEGATIVE Final    Comment: (NOTE) The Xpert Xpress SARS-CoV-2/FLU/RSV assay is intended as an aid in  the diagnosis of influenza from Nasopharyngeal swab specimens and  should not be used as a sole basis for treatment. Nasal washings and  aspirates are unacceptable for Xpert Xpress SARS-CoV-2/FLU/RSV  testing. Fact Sheet for Patients: PinkCheek.be Fact Sheet for Healthcare Providers: GravelBags.it This test is not yet approved or cleared by the [place] FDA and  has been authorized for detection and/or diagnosis of SARS-CoV-2 by  FDA under an Emergency Use Authorization (EUA). This EUA will remain  in effect (meaning this test can be used) for the duration of the  Covid-19 declaration under Section 564(b)(1) of the  Act, 21  U.S.C. section 360bbb-3(b)(1), unless the authorization is  terminated or revoked. Performed at [facility], [address] [place],  [address]     Radiology Studies: No results found.  Scheduled Meds: . atorvastatin  20 mg Oral Daily  . dorzolamide-timolol  1 drop Both Eyes Daily  . ferrous sulfate  325 mg Oral BID WC  . lidocaine      . meperidine      . midazolam      . pantoprazole (PROTONIX) IV  40 mg Intravenous Q24H  . polyethylene glycol-electrolytes  4,000 mL Oral Once  . sodium chloride flush  3 mL Intravenous Q12H  . sodium chloride flush       Continuous Infusions: . sodium chloride    . cefTRIAXone (ROCEPHIN)  IV Stopped (04/04/20 2104)     LOS: 0 days    Time spent: 27 minutes  Nyrie Sigal Wynetta Emery, MD [facility]  To contact the attending provider between 7A-7P or the covering provider during after hours 7P-7A, please  log into the web site [email or web address] and access using universal [place] password for that web site. If you do not have the password, please call the hospital operator.  04/05/2020, 3:12 PM

## 2020-04-06 ENCOUNTER — Inpatient Hospital Stay (HOSPITAL_COMMUNITY): Payer: Medicare HMO

## 2020-04-06 ENCOUNTER — Encounter (HOSPITAL_COMMUNITY)
Admission: EM | Disposition: A | Discharge status: Home Health | Payer: Self-pay | Source: Home / Self Care | Attending: Internal Medicine

## 2020-04-06 ENCOUNTER — Encounter (HOSPITAL_COMMUNITY): Payer: Self-pay | Admitting: Family Medicine

## 2020-04-06 DIAGNOSIS — R310 Gross hematuria: Secondary | ICD-10-CM

## 2020-04-06 DIAGNOSIS — C182 Malignant neoplasm of ascending colon: Secondary | ICD-10-CM

## 2020-04-06 DIAGNOSIS — N3289 Other specified disorders of bladder: Secondary | ICD-10-CM

## 2020-04-06 DIAGNOSIS — K644 Residual hemorrhoidal skin tags: Secondary | ICD-10-CM

## 2020-04-06 HISTORY — PX: COLONOSCOPY: SHX5424

## 2020-04-06 HISTORY — PX: BIOPSY: SHX5522

## 2020-04-06 LAB — CBC
HCT: 30.7 % — ABNORMAL LOW (ref 36.0–46.0)
Hemoglobin: 9.5 g/dL — ABNORMAL LOW (ref 12.0–15.0)
MCH: 26.5 pg (ref 26.0–34.0)
MCHC: 30.9 g/dL (ref 30.0–36.0)
MCV: 85.5 fL (ref 80.0–100.0)
Platelets: 241 10*3/uL (ref 150–400)
RBC: 3.59 MIL/uL — ABNORMAL LOW (ref 3.87–5.11)
RDW: 16.7 % — ABNORMAL HIGH (ref 11.5–15.5)
WBC: 8.5 10*3/uL (ref 4.0–10.5)
nRBC: 0 % (ref 0.0–0.2)

## 2020-04-06 LAB — TYPE AND SCREEN
ABO/RH(D): A NEG
Antibody Screen: POSITIVE
Donor AG Type: NEGATIVE
Donor AG Type: NEGATIVE
PT AG Type: NEGATIVE
Unit division: 0
Unit division: 0
Unit division: 0

## 2020-04-06 LAB — BPAM RBC
Blood Product Expiration Date: 202105182359
Blood Product Expiration Date: 202105192359
Blood Product Expiration Date: 202106102359
ISSUE DATE / TIME: 202105012334
ISSUE DATE / TIME: 202105020318
ISSUE DATE / TIME: 202105021138
Unit Type and Rh: 600
Unit Type and Rh: 9500
Unit Type and Rh: 9500

## 2020-04-06 LAB — BASIC METABOLIC PANEL
Anion gap: 8 (ref 5–15)
BUN: 24 mg/dL — ABNORMAL HIGH (ref 8–23)
CO2: 26 mmol/L (ref 22–32)
Calcium: 8.5 mg/dL — ABNORMAL LOW (ref 8.9–10.3)
Chloride: 105 mmol/L (ref 98–111)
Creatinine, Ser: 1.35 mg/dL — ABNORMAL HIGH (ref 0.44–1.00)
GFR calc Af Amer: 45 mL/min — ABNORMAL LOW (ref >60)
GFR calc non Af Amer: 39 mL/min — ABNORMAL LOW (ref >60)
Glucose, Bld: 112 mg/dL — ABNORMAL HIGH (ref 70–99)
Potassium: 3.4 mmol/L — ABNORMAL LOW (ref 3.5–5.1)
Sodium: 139 mmol/L (ref 135–145)

## 2020-04-06 LAB — URINE CULTURE: Culture: NO GROWTH

## 2020-04-06 LAB — GLUCOSE, CAPILLARY
Glucose-Capillary: 85 mg/dL (ref 70–99)
Glucose-Capillary: 90 mg/dL (ref 70–99)

## 2020-04-06 SURGERY — COLONOSCOPY
Anesthesia: Moderate Sedation

## 2020-04-06 MED ORDER — POLYETHYLENE GLYCOL 3350 17 G PO PACK
15.0000 | PACK | Freq: Once | ORAL | Status: DC
  Filled 2020-04-06: qty 15

## 2020-04-06 MED ORDER — STERILE WATER FOR IRRIGATION IR SOLN
Status: DC | PRN
  Administered 2020-04-06: 1.5 mL

## 2020-04-06 MED ORDER — METRONIDAZOLE 500 MG PO TABS
1000.0000 mg | ORAL_TABLET | Freq: Once | ORAL | Status: AC
  Administered 2020-04-07: 15:00:00 1000 mg via ORAL
  Filled 2020-04-06: qty 2

## 2020-04-06 MED ORDER — MEPERIDINE HCL 50 MG/ML IJ SOLN
INTRAMUSCULAR | Status: AC
  Filled 2020-04-06: qty 1

## 2020-04-06 MED ORDER — SODIUM CHLORIDE 0.9 % IV SOLN: INTRAVENOUS | Status: DC

## 2020-04-06 MED ORDER — NEOMYCIN SULFATE 500 MG PO TABS
1000.0000 mg | ORAL_TABLET | Freq: Once | ORAL | Status: AC
  Administered 2020-04-07: 1000 mg via ORAL
  Filled 2020-04-06: qty 2

## 2020-04-06 MED ORDER — IOHEXOL 300 MG/ML  SOLN
75.0000 mL | Freq: Once | INTRAMUSCULAR | Status: AC | PRN
  Administered 2020-04-06: 75 mL via INTRAVENOUS

## 2020-04-06 MED ORDER — BISACODYL 5 MG PO TBEC
20.0000 mg | DELAYED_RELEASE_TABLET | Freq: Once | ORAL | Status: AC
  Administered 2020-04-07: 06:00:00 20 mg via ORAL
  Filled 2020-04-06: qty 4

## 2020-04-06 MED ORDER — NEOMYCIN SULFATE 500 MG PO TABS
1000.0000 mg | ORAL_TABLET | Freq: Once | ORAL | Status: AC
  Administered 2020-04-07: 15:00:00 1000 mg via ORAL
  Filled 2020-04-06: qty 2

## 2020-04-06 MED ORDER — METRONIDAZOLE 500 MG PO TABS
1000.0000 mg | ORAL_TABLET | Freq: Once | ORAL | Status: AC
  Administered 2020-04-07: 1000 mg via ORAL
  Filled 2020-04-06: qty 2

## 2020-04-06 MED ORDER — IOHEXOL 9 MG/ML PO SOLN
ORAL | Status: AC
  Administered 2020-04-06: 1000 mL
  Filled 2020-04-06: qty 1000

## 2020-04-06 MED ORDER — POTASSIUM CHLORIDE CRYS ER 20 MEQ PO TBCR
40.0000 meq | EXTENDED_RELEASE_TABLET | Freq: Once | ORAL | Status: AC
  Administered 2020-04-06: 40 meq via ORAL
  Filled 2020-04-06: qty 2

## 2020-04-06 MED ORDER — METRONIDAZOLE 500 MG PO TABS
1000.0000 mg | ORAL_TABLET | Freq: Once | ORAL | Status: AC
  Administered 2020-04-07: 13:00:00 1000 mg via ORAL
  Filled 2020-04-06: qty 2

## 2020-04-06 MED ORDER — MIDAZOLAM HCL 5 MG/5ML IJ SOLN
INTRAMUSCULAR | Status: DC | PRN
  Administered 2020-04-06: 1 mg via INTRAVENOUS
  Administered 2020-04-06: 2 mg via INTRAVENOUS
  Administered 2020-04-06: 1 mg via INTRAVENOUS

## 2020-04-06 MED ORDER — NEOMYCIN SULFATE 500 MG PO TABS
1000.0000 mg | ORAL_TABLET | Freq: Once | ORAL | Status: AC
  Administered 2020-04-07: 13:00:00 1000 mg via ORAL
  Filled 2020-04-06: qty 2

## 2020-04-06 MED ORDER — MIDAZOLAM HCL 5 MG/5ML IJ SOLN
INTRAMUSCULAR | Status: AC
  Filled 2020-04-06: qty 10

## 2020-04-06 MED ORDER — MEPERIDINE HCL 50 MG/ML IJ SOLN
INTRAMUSCULAR | Status: DC | PRN
  Administered 2020-04-06: 25 mg via INTRAVENOUS
  Administered 2020-04-06: 15 mg via INTRAVENOUS

## 2020-04-06 NOTE — Progress Notes (Signed)
Brief colonoscopy note  Large circumferential ulcerated mass extending from superior aspect of ileocecal valve to hepatic flexure.  Multiple biopsies taken. Rest of the exam normal except small external hemorrhoids.

## 2020-04-06 NOTE — Consult Note (Signed)
Urology Consult  Referring physician: Dr. Wynetta Emery Reason for referral: Bladder mass, gross hematuria  Chief Complaint: Gross hematuria  History of Present Illness: Gabrielle [name] is a 75yo with a hx of HTN, HLD admitted with symptomatic anemia and hypokalemia. She was found to have a colon mass likely malignancy. She underwent CT abd/pelvis today which showed the ascending colon mass and a thick walled bladder. She did not have hydronephrosis on CT.   The patient has a hx of incomplete emptying and weak urinary stream for over 15 years. She has nocturia 3-5x, urinary urgency, frequency and occasional urge incontinence. No hx of hematuria, dysuria or recurrent UTI. She had a foley catheter placed yesterday and drained 2L upon placement. She developed gross hematuria after foley placement. Urine culture negative.  Past Medical History:  Diagnosis Date  . Cataract    OU  . H/O cesarean section   . Hx of tonsillectomy   . Hyperlipidemia   . Hypertension   . Macular degeneration    Exu ARMD OU   Past Surgical History:  Procedure Laterality Date  . BIOPSY  04/06/2020   Procedure: BIOPSY;  Surgeon: Rogene Houston, MD;  [facility];  Service: Endoscopy;;  . CARPAL TUNNEL RELEASE Right   . CATARACT EXTRACTION W/PHACO Left 10/11/2019   Procedure: CATARACT EXTRACTION PHACO AND INTRAOCULAR LENS PLACEMENT (IOC);  Surgeon: Baruch Goldmann, MD;  Location: [facility];  Service: Ophthalmology;  Laterality: Left;  CDE: 8.56  . CATARACT EXTRACTION W/PHACO Right 10/25/2019   Procedure: CATARACT EXTRACTION PHACO AND INTRAOCULAR LENS PLACEMENT (IOC);  Surgeon: Baruch Goldmann, MD;  Location: [facility];  Service: Ophthalmology;  Laterality: Right;  CDE: 5.67  . CESAREAN SECTION    . COLONOSCOPY N/A 04/06/2020   Procedure: COLONOSCOPY;  Surgeon: Rogene Houston, MD;  [facility];  Service: Endoscopy;  Laterality: N/A;  . ESOPHAGOGASTRODUODENOSCOPY N/A 04/05/2020   Procedure: ESOPHAGOGASTRODUODENOSCOPY  (EGD);  Surgeon: Rogene Houston, MD;  [facility];  Service: Endoscopy;  Laterality: N/A;  . TONSILLECTOMY      Medications: I have reviewed the patient's current medications. Allergies:  Allergies  Allergen Reactions  . Lisinopril Cough    Pt states she is not allergic to lisinopril    Family History  Problem Relation Age of Onset  . Macular degeneration Mother   . Stroke Mother   . Hypertension Mother   . Dementia Mother   . Cancer Father        lung  . Diabetes Sister   . Hypertension Sister    Social History:  reports that she has never smoked. She has never used smokeless tobacco. She reports that she does not drink alcohol or use drugs.  Review of Systems  Gastrointestinal: Positive for blood in stool.  Genitourinary: Positive for hematuria.  All other systems reviewed and are negative.   Physical Exam:  Vital signs in last 24 hours: Temp:  [97.7 F (36.5 C)-99.3 F (37.4 C)] 99.3 F (37.4 C) (05/03 2028) Pulse Rate:  [44-81] 73 (05/03 2028) Resp:  [11-22] 20 (05/03 2028) BP: (118-157)/(49-76) 147/57 (05/03 2028) SpO2:  [95 %-100 %] 98 % (05/03 2028) Weight:  [68 kg] 68 kg (05/03 5643) Physical Exam  Constitutional: She is oriented to person, place, and time. She appears well-developed and well-nourished.  HENT:  Head: Normocephalic and atraumatic.  Eyes: Pupils are equal, round, and reactive to light. EOM are normal.  Neck: No thyromegaly present.  Cardiovascular: Normal rate and regular  rhythm.  Respiratory: Effort normal. No respiratory distress.  GI: Soft. She exhibits no distension.  Musculoskeletal:        General: No edema. Normal range of motion.     Cervical back: Normal range of motion.  Neurological: She is alert and oriented to person, place, and time.  Skin: Skin is warm and dry.  Psychiatric: She has a normal mood and affect. Her behavior is normal. Judgment and thought content normal.    Laboratory Data:  Results for  orders placed or performed during the hospital encounter of 04/04/20 (from the past 72 hour(s))  Type and screen [facility]     Status: None   Collection Time: 04/04/20  3:08 PM  Result Value Ref Range   ABO/RH(D) A NEG    Antibody Screen POS    Sample Expiration 04/07/2020,2359    Antibody Identification ANTI FYA (Duffy a)    PT AG Type NEGATIVE FOR DUFFY A ANTIGEN    Unit Number [id number]    Blood Component Type RED CELLS,LR    Unit division 00    Status of Unit ISSUED,FINAL    Donor AG Type NEGATIVE FOR DUFFY A ANTIGEN    Transfusion Status OK TO TRANSFUSE    Crossmatch Result COMPATIBLE    Unit Number [id number]    Blood Component Type RED CELLS,LR    Unit division 00    Status of Unit DISCARDED    Donor AG Type NEGATIVE FOR DUFFY A ANTIGEN    Transfusion Status OK TO TRANSFUSE    Crossmatch Result COMPATIBLE    Unit Number [id number]    Blood Component Type RED CELLS,LR    Unit division 00    Status of Unit ISSUED,FINAL    Donor AG Type      NEGATIVE FOR C ANTIGEN NEGATIVE FOR DUFFY A ANTIGEN NEGATIVE FOR E ANTIGEN NEGATIVE FOR KELL ANTIGEN   Transfusion Status OK TO TRANSFUSE    Crossmatch Result COMPATIBLE   ABO/Rh     Status: None   Collection Time: 04/04/20  3:08 PM  Result Value Ref Range   ABO/RH(D)      A NEG Performed at [facility], [address], [place], [place] [address]   Prepare RBC (crossmatch)     Status: None   Collection Time: 04/04/20  3:08 PM  Result Value Ref Range   Order Confirmation      ORDER PROCESSED BY BLOOD BANK Performed at [facility], [address], [place],  [address]   CBC with Differential     Status: Abnormal   Collection Time: 04/04/20  3:19 PM  Result Value Ref Range   WBC 7.8 4.0 - 10.5 K/uL   RBC 2.35 (L) 3.87 - 5.11 MIL/uL   Hemoglobin 5.7 (LL) 12.0 - 15.0 g/dL    Comment: This critical result has verified and been called to Bryna Colander by Deanne Coffer on 05 01 2021 at 1542, and has  been read back.    HCT 20.1 (L) 36.0 - 46.0 %   MCV 85.5 80.0 - 100.0 fL   MCH 24.3 (L) 26.0 - 34.0 pg   MCHC 28.4 (L) 30.0 - 36.0 g/dL   RDW 17.5 (H) 11.5 - 15.5 %   Platelets 257 150 - 400 K/uL   nRBC 0.0 0.0 - 0.2 %   Neutrophils Relative % 78 %   Neutro Abs 6.0 1.7 - 7.7 K/uL   Lymphocytes Relative 11 %   Lymphs Abs 0.9 0.7 - 4.0  K/uL   Monocytes Relative 10 %   Monocytes Absolute 0.8 0.1 - 1.0 K/uL   Eosinophils Relative 1 %   Eosinophils Absolute 0.1 0.0 - 0.5 K/uL   Basophils Relative 0 %   Basophils Absolute 0.0 0.0 - 0.1 K/uL   Immature Granulocytes 0 %   Abs Immature Granulocytes 0.03 0.00 - 0.07 K/uL    Comment: Performed at [facility], [address], [place], [place] [address]  Basic metabolic panel     Status: Abnormal   Collection Time: 04/04/20  3:19 PM  Result Value Ref Range   Sodium 134 (L) 135 - 145 mmol/L   Potassium 3.0 (L) 3.5 - 5.1 mmol/L   Chloride 99 98 - 111 mmol/L   CO2 26 22 - 32 mmol/L   Glucose, Bld 128 (H) 70 - 99 mg/dL    Comment: Glucose reference range applies only to samples taken after fasting for at least 8 hours.   BUN 36 (H) 8 - 23 mg/dL   Creatinine, Ser 1.59 (H) 0.44 - 1.00 mg/dL   Calcium 8.7 (L) 8.9 - 10.3 mg/dL   GFR calc non Af Amer 32 (L) >60 mL/min   GFR calc Af Amer 37 (L) >60 mL/min   Anion gap 9 5 - 15    Comment: Performed at [facility], [address] [place], [place] [address]  Hepatic function panel     Status: Abnormal   Collection Time: 04/04/20  3:19 PM  Result Value Ref Range   Total Protein 7.0 6.5 - 8.1 g/dL   Albumin 3.4 (L) 3.5 - 5.0 g/dL   AST 11 (L) 15 - 41 U/L   ALT 6 0 - 44 U/L   Alkaline Phosphatase 62 38 - 126 U/L   Total Bilirubin 0.5 0.3 - 1.2 mg/dL   Bilirubin, Direct <0.1 0.0 - 0.2 mg/dL   Indirect Bilirubin NOT CALCULATED 0.3 - 0.9 mg/dL    Comment: Performed at [facility], [address], [place], [place] [address]  Protime-INR     Status: None   Collection Time: 04/04/20  3:19 PM  Result  Value Ref Range   Prothrombin Time 13.2 11.4 - 15.2 seconds   INR 1.0 0.8 - 1.2    Comment: (NOTE) INR goal varies based on device and disease states. Performed at [facility], [address], [place], [place] [address]   Magnesium     Status: None   Collection Time: 04/04/20  3:19 PM  Result Value Ref Range   Magnesium 2.4 1.7 - 2.4 mg/dL    Comment: Performed at [facility], [address], [place], [place] [address]  Respiratory Panel by RT PCR (Flu A&B, Covid) - Nasopharyngeal Swab     Status: None   Collection Time: 04/04/20  3:37 PM   Specimen: Nasopharyngeal Swab  Result Value Ref Range   SARS Coronavirus 2 by RT PCR NEGATIVE NEGATIVE    Comment: (NOTE) SARS-CoV-2 target nucleic acids are NOT DETECTED. The SARS-CoV-2 RNA is generally detectable in upper respiratoy specimens during the acute phase of infection. The lowest concentration of SARS-CoV-2 viral copies this assay can detect is 131 copies/mL. A negative result does not preclude SARS-Cov-2 infection and should not be used as the sole basis for treatment or other patient management decisions. A negative result may occur with  improper specimen collection/handling, submission of specimen other than nasopharyngeal swab, presence of viral mutation(s) within the areas targeted by this assay, and inadequate number of viral copies (<131  copies/mL). A negative result must be combined with clinical observations, patient history, and epidemiological information. The expected result is Negative. Fact Sheet for Patients:  PinkCheek.be Fact Sheet for Healthcare Providers:  GravelBags.it This test is not yet ap proved or cleared by the [place] FDA and  has been authorized for detection and/or diagnosis of SARS-CoV-2 by FDA under an Emergency Use Authorization (EUA). This EUA will remain  in effect (meaning this test can be used) for the duration of the COVID-19  declaration under Section 564(b)(1) of the Act, 21 U.S.C. section 360bbb-3(b)(1), unless the authorization is terminated or revoked sooner.    Influenza A by PCR NEGATIVE NEGATIVE   Influenza B by PCR NEGATIVE NEGATIVE    Comment: (NOTE) The Xpert Xpress SARS-CoV-2/FLU/RSV assay is intended as an aid in  the diagnosis of influenza from Nasopharyngeal swab specimens and  should not be used as a sole basis for treatment. Nasal washings and  aspirates are unacceptable for Xpert Xpress SARS-CoV-2/FLU/RSV  testing. Fact Sheet for Patients: PinkCheek.be Fact Sheet for Healthcare Providers: GravelBags.it This test is not yet approved or cleared by the [place] FDA and  has been authorized for detection and/or diagnosis of SARS-CoV-2 by  FDA under an Emergency Use Authorization (EUA). This EUA will remain  in effect (meaning this test can be used) for the duration of the  Covid-19 declaration under Section 564(b)(1) of the Act, 21  U.S.C. section 360bbb-3(b)(1), unless the authorization is  terminated or revoked. Performed at [facility], [address] [place], [place] [address]   POC occult blood, ED Provider will collect     Status: Abnormal   Collection Time: 04/04/20  3:41 PM  Result Value Ref Range   Fecal Occult Bld POSITIVE (A) NEGATIVE  Urinalysis, Routine w reflex microscopic     Status: Abnormal   Collection Time: 04/04/20  5:58 PM  Result Value Ref Range   Color, Urine YELLOW YELLOW   APPearance CLEAR CLEAR   Specific Gravity, Urine 1.008 1.005 - 1.030   pH 8.0 5.0 - 8.0   Glucose, UA NEGATIVE NEGATIVE mg/dL   Hgb urine dipstick NEGATIVE NEGATIVE   Bilirubin Urine NEGATIVE NEGATIVE   Ketones, ur NEGATIVE NEGATIVE mg/dL   Protein, ur NEGATIVE NEGATIVE mg/dL   Nitrite NEGATIVE NEGATIVE   Leukocytes,Ua LARGE (A) NEGATIVE   RBC / HPF 0-5 0 - 5 RBC/hpf   WBC, UA >50 (H) 0 - 5 WBC/hpf   Bacteria, UA RARE (A)  NONE SEEN   Squamous Epithelial / LPF 6-10 0 - 5    Comment: Performed at [facility], [address], [place], [place] [address]  Urine culture     Status: None   Collection Time: 04/04/20  5:59 PM   Specimen: Urine, Clean Catch  Result Value Ref Range   Specimen Description      URINE, CLEAN CATCH Performed at [facility], [address] [place], [place] [address]    Special Requests      NONE Performed at [facility], [address], [place], [place] [address]    Culture      NO GROWTH Performed at [facility], [place] [address] [place],  [address]    Report Status 04/06/2020 FINAL   Basic metabolic panel     Status: Abnormal   Collection Time: 04/05/20  6:18 AM  Result Value Ref Range   Sodium 137 135 - 145 mmol/L   Potassium 3.7 3.5 - 5.1 mmol/L  Comment: DELTA CHECK NOTED   Chloride 103 98 - 111 mmol/L   CO2 26 22 - 32 mmol/L   Glucose, Bld 107 (H) 70 - 99 mg/dL    Comment: Glucose reference range applies only to samples taken after fasting for at least 8 hours.   BUN 35 (H) 8 - 23 mg/dL   Creatinine, Ser 1.51 (H) 0.44 - 1.00 mg/dL   Calcium 8.5 (L) 8.9 - 10.3 mg/dL   GFR calc non Af Amer 34 (L) >60 mL/min   GFR calc Af Amer 39 (L) >60 mL/min   Anion gap 8 5 - 15    Comment: Performed at [facility], [address] [place], [place] [address]  CBC     Status: Abnormal   Collection Time: 04/05/20  6:18 AM  Result Value Ref Range   WBC 6.4 4.0 - 10.5 K/uL   RBC 2.83 (L) 3.87 - 5.11 MIL/uL   Hemoglobin 7.1 (L) 12.0 - 15.0 g/dL   HCT 24.3 (L) 36.0 - 46.0 %   MCV 85.9 80.0 - 100.0 fL   MCH 25.1 (L) 26.0 - 34.0 pg   MCHC 29.2 (L) 30.0 - 36.0 g/dL   RDW 16.5 (H) 11.5 - 15.5 %   Platelets 232 150 - 400 K/uL   nRBC 0.0 0.0 - 0.2 %    Comment: Performed at [facility], [address] [place], [place] [address]  Glucose, capillary     Status: None   Collection Time: 04/05/20  9:13 PM  Result Value Ref Range   Glucose-Capillary 85 70 - 99 mg/dL     Comment: Glucose reference range applies only to samples taken after fasting for at least 8 hours.   Comment 1 Notify RN    Comment 2 Document in Chart   CBC     Status: Abnormal   Collection Time: 04/06/20  5:22 AM  Result Value Ref Range   WBC 8.5 4.0 - 10.5 K/uL   RBC 3.59 (L) 3.87 - 5.11 MIL/uL   Hemoglobin 9.5 (L) 12.0 - 15.0 g/dL    Comment: REPEATED TO VERIFY POST TRANSFUSION SPECIMEN DELTA CHECK NOTED    HCT 30.7 (L) 36.0 - 46.0 %   MCV 85.5 80.0 - 100.0 fL   MCH 26.5 26.0 - 34.0 pg   MCHC 30.9 30.0 - 36.0 g/dL   RDW 16.7 (H) 11.5 - 15.5 %   Platelets 241 150 - 400 K/uL   nRBC 0.0 0.0 - 0.2 %    Comment: Performed at [facility], [address] [place], [place] [address]  Basic metabolic panel     Status: Abnormal   Collection Time: 04/06/20  5:22 AM  Result Value Ref Range   Sodium 139 135 - 145 mmol/L   Potassium 3.4 (L) 3.5 - 5.1 mmol/L   Chloride 105 98 - 111 mmol/L   CO2 26 22 - 32 mmol/L   Glucose, Bld 112 (H) 70 - 99 mg/dL    Comment: Glucose reference range applies only to samples taken after fasting for at least 8 hours.   BUN 24 (H) 8 - 23 mg/dL   Creatinine, Ser 1.35 (H) 0.44 - 1.00 mg/dL   Calcium 8.5 (L) 8.9 - 10.3 mg/dL   GFR calc non Af Amer 39 (L) >60 mL/min   GFR calc Af Amer 45 (L) >60 mL/min   Anion gap 8 5 - 15    Comment: Performed at [facility], [address], [place], [place] [address]  Glucose, capillary     Status: None   Collection Time: 04/06/20 11:04 AM  Result Value Ref Range   Glucose-Capillary 85 70 - 99 mg/dL    Comment: Glucose reference range applies only to samples taken after fasting for at least 8 hours.  Glucose, capillary     Status: None   Collection Time: 04/06/20  4:26 PM  Result Value Ref Range   Glucose-Capillary 90 70 - 99 mg/dL    Comment: Glucose reference range applies only to samples taken after fasting for at least 8 hours.   Recent Results (from the past 240 hour(s))  Respiratory Panel by RT PCR (Flu A&B,  Covid) - Nasopharyngeal Swab     Status: None   Collection Time: 04/04/20  3:37 PM   Specimen: Nasopharyngeal Swab  Result Value Ref Range Status   SARS Coronavirus 2 by RT PCR NEGATIVE NEGATIVE Final    Comment: (NOTE) SARS-CoV-2 target nucleic acids are NOT DETECTED. The SARS-CoV-2 RNA is generally detectable in upper respiratoy specimens during the acute phase of infection. The lowest concentration of SARS-CoV-2 viral copies this assay can detect is 131 copies/mL. A negative result does not preclude SARS-Cov-2 infection and should not be used as the sole basis for treatment or other patient management decisions. A negative result may occur with  improper specimen collection/handling, submission of specimen other than nasopharyngeal swab, presence of viral mutation(s) within the areas targeted by this assay, and inadequate number of viral copies (<131 copies/mL). A negative result must be combined with clinical observations, patient history, and epidemiological information. The expected result is Negative. Fact Sheet for Patients:  PinkCheek.be Fact Sheet for Healthcare Providers:  GravelBags.it This test is not yet ap proved or cleared by the [place] FDA and  has been authorized for detection and/or diagnosis of SARS-CoV-2 by FDA under an Emergency Use Authorization (EUA). This EUA will remain  in effect (meaning this test can be used) for the duration of the COVID-19 declaration under Section 564(b)(1) of the Act, 21 U.S.C. section 360bbb-3(b)(1), unless the authorization is terminated or revoked sooner.    Influenza A by PCR NEGATIVE NEGATIVE Final   Influenza B by PCR NEGATIVE NEGATIVE Final    Comment: (NOTE) The Xpert Xpress SARS-CoV-2/FLU/RSV assay is intended as an aid in  the diagnosis of influenza from Nasopharyngeal swab specimens and  should not be used as a sole basis for treatment. Nasal washings and   aspirates are unacceptable for Xpert Xpress SARS-CoV-2/FLU/RSV  testing. Fact Sheet for Patients: PinkCheek.be Fact Sheet for Healthcare Providers: GravelBags.it This test is not yet approved or cleared by the [place] FDA and  has been authorized for detection and/or diagnosis of SARS-CoV-2 by  FDA under an Emergency Use Authorization (EUA). This EUA will remain  in effect (meaning this test can be used) for the duration of the  Covid-19 declaration under Section 564(b)(1) of the Act, 21  U.S.C. section 360bbb-3(b)(1), unless the authorization is  terminated or revoked. Performed at [facility], [address], [place], [place] [address]   Urine culture     Status: None   Collection Time: 04/04/20  5:59 PM   Specimen: Urine, Clean Catch  Result Value Ref Range Status   Specimen Description   Final    URINE, CLEAN CATCH Performed at [facility], [address], [place], [place] [address]    Special Requests   Final    NONE Performed at [facility], [address] [place], [place]  [address]    Culture   Final    NO GROWTH Performed at [facility], [place] [address], [place], [place] [address]    Report Status 04/06/2020 FINAL  Final   Creatinine: Recent Labs    04/04/20 1519 04/05/20 0618 04/06/20 0522  CREATININE 1.59* 1.51* 1.35*   Baseline Creatinine: 1.1  Impression/Assessment:  73yo with bladder mass and gross hematuria  Plan:  1. I discussed the workup of gross hematuria and the bladder mass with the patient and daughter. We discussed the role of cystoscopy and bladder biopsy in evaluating the patients hematuria. We will coordinate with Dr. Constance Haw to perform cystoscopy and bladder biopsy at the time of her colon resection.   Nicolette Bang 04/06/2020, 10:56 PM

## 2020-04-06 NOTE — Progress Notes (Addendum)
[facility]   Notified of consult for ascending colon cancer. Spoke with Dr. Wynetta Emery, patient's daughter requesting she is present when patient is made aware of results. This will happen later today. Request that I see patient tomorrow.  Will place orders for bowel prep tomorrow (dulcolax 4 tablets at 7AM, Miralax in Gatorade @ 9AM, Neomycin and Flagyl @ 1400, 1500, 2200). Will need to be on clears in AM.  ECHO preop Surgery posted for possible Wednesday pending patient's and family's decision.   Curlene Labrum, MD [facility] [address] [place], [place] [address] [phone] (office)

## 2020-04-06 NOTE — Progress Notes (Signed)
CT reviewed with Dr. Thornton Papas and results discussed with patient and [name] was at bedside. I told patient and her daughter that she has malignant tumor involving ascending colon and endoscopically appeared to be adenocarcinoma of the colon.  Biopsies would be out tomorrow. No hepatic mets.  She does have few prominent lymph nodes surrounding the tumor. Severe bladder wall thickening most likely due to cystitis but will need to rule out other etiologies. Dr. Wynetta Emery has requested urologic consultation. Patient informed that she will need right hemicolectomy.  Dr. Constance Haw has been consulted and will see patient tomorrow morning.

## 2020-04-06 NOTE — Progress Notes (Signed)
PROGRESS NOTE   Gabrielle [name]  ATF:[id number] DOB: 11-13-1946 DOA: 04/04/2020 PCP: Janora Norlander, DO   Chief Complaint  Patient presents with  . Weakness    Brief Narrative:  74 year old female with hypertension CKD stage III, cataracts and poor vision acuity presented with symptomatic anemia.  Patient is status post 2 units PRBC transfusion.  Assessment & Plan:   Principal Problem:   Symptomatic anemia Active Problems:   Essential hypertension   Other hyperlipidemia   Osteopenia after menopause   Stage 3 chronic kidney disease   Macular degeneration  1. Symptomatic anemia-patient is now status post 2 units PRBC.  Hg improved to 9.5.  She had an EGD with Dr. Laural Golden today but no source of bleeding was found.  Colonoscopy 04/06/20 with ascending colon mass, biopsies taken.  CT with contrast ordered.  2. Severe iron deficiency anemia-chronic blood loss anemia from presumed colon cancer. 3. Presumed malignant tumor ascending colon - awaiting biopsy results.  I asked for a surgical consult.   4. UTI-continue ceftriaxone IV. 5. Acute urinary retention-developed acute urinary retention requiring I/O cath and with procedures being planned opted to place a Foley catheter.  She will need a voiding trial prior to discharge and urology follow-up. 6. Macular degeneration-patient reports she is followed closely by her ophthalmologist who is giving her treatments for this.  DVT prophylaxis: SCDs Code Status: Full Family Communication: Spoke with daughter by telephone for update, she wants to meet 2:30 today and we will speak with patient together.  Disposition:   Status is: Inpatient  Remains inpatient appropriate because:Ongoing diagnostic testing needed not appropriate for outpatient work up   Dispo: The patient is from: Home              Anticipated d/c is to: Home              Anticipated d/c date is: 2 days              Patient currently is not medically stable to d/c.    Consultants:   GI  Procedures:   EGD 04/05/20  Antimicrobials: ceftriaxone     Subjective: Patient tolerated colonoscopy without problems, now drinking contrast for CT scan.  No specific complaints.   Objective: Vitals:   04/06/20 0805 04/06/20 0810 04/06/20 0815 04/06/20 0834  BP: (!) 133/54 (!) 130/53 (!) 122/56 (!) 118/49  Pulse: 71 72 69 70  Resp: 13 14 11 17   Temp:      TempSrc:      SpO2: 98% 98% 99% 96%  Weight:      Height:        Intake/Output Summary (Last 24 hours) at 04/06/2020 0924 Last data filed at 04/06/2020 0821 Gross per 24 hour  Intake 400 ml  Output 3600 ml  Net -3200 ml   Filed Weights   04/04/20 1509 04/04/20 2118 04/06/20 0658  Weight: 68 kg 64.6 kg 68 kg    Examination:  General exam: Appears calm and comfortable.  Functional blindness. Respiratory system: Clear to auscultation. Respiratory effort normal. Cardiovascular system: S1 & S2 heard, RRR. No JVD, murmurs, rubs, gallops or clicks. No pedal edema. Gastrointestinal system: Abdomen is nondistended, soft and nontender. No organomegaly or masses felt. Normal bowel sounds heard. Central nervous system: Alert and oriented. No focal neurological deficits. Extremities: Symmetric 5 x 5 power. Skin: No rashes, lesions or ulcers Psychiatry: Judgement and insight appear normal. Mood & affect appropriate.   Data Reviewed: I have personally reviewed following  labs and imaging studies  CBC: Recent Labs  Lab 03/30/20 0926 04/04/20 1519 04/05/20 0618 04/06/20 0522  WBC 7.3 7.8 6.4 8.5  NEUTROABS 5.3 6.0  --   --   HGB 7.0* 5.7* 7.1* 9.5*  HCT 23.6* 20.1* 24.3* 30.7*  MCV 82 85.5 85.9 85.5  PLT 230 257 232 725    Basic Metabolic Panel: Recent Labs  Lab 04/04/20 1519 04/05/20 0618 04/06/20 0522  NA 134* 137 139  K 3.0* 3.7 3.4*  CL 99 103 105  CO2 26 26 26   GLUCOSE 128* 107* 112*  BUN 36* 35* 24*  CREATININE 1.59* 1.51* 1.35*  CALCIUM 8.7* 8.5* 8.5*  MG 2.4  --   --      GFR: Estimated Creatinine Clearance: 33.6 mL/min (A) (by C-G formula based on SCr of 1.35 mg/dL (H)).  Liver Function Tests: Recent Labs  Lab 04/04/20 1519  AST 11*  ALT 6  ALKPHOS 62  BILITOT 0.5  PROT 7.0  ALBUMIN 3.4*    CBG: Recent Labs  Lab 04/05/20 2113  GLUCAP 85     Recent Results (from the past 240 hour(s))  Respiratory Panel by RT PCR (Flu A&B, Covid) - Nasopharyngeal Swab     Status: None   Collection Time: 04/04/20  3:37 PM   Specimen: Nasopharyngeal Swab  Result Value Ref Range Status   SARS Coronavirus 2 by RT PCR NEGATIVE NEGATIVE Final    Comment: (NOTE) SARS-CoV-2 target nucleic acids are NOT DETECTED. The SARS-CoV-2 RNA is generally detectable in upper respiratoy specimens during the acute phase of infection. The lowest concentration of SARS-CoV-2 viral copies this assay can detect is 131 copies/mL. A negative result does not preclude SARS-Cov-2 infection and should not be used as the sole basis for treatment or other patient management decisions. A negative result may occur with  improper specimen collection/handling, submission of specimen other than nasopharyngeal swab, presence of viral mutation(s) within the areas targeted by this assay, and inadequate number of viral copies (<131 copies/mL). A negative result must be combined with clinical observations, patient history, and epidemiological information. The expected result is Negative. Fact Sheet for Patients:  PinkCheek.be Fact Sheet for Healthcare Providers:  GravelBags.it This test is not yet ap proved or cleared by the [place] FDA and  has been authorized for detection and/or diagnosis of SARS-CoV-2 by FDA under an Emergency Use Authorization (EUA). This EUA will remain  in effect (meaning this test can be used) for the duration of the COVID-19 declaration under Section 564(b)(1) of the Act, 21 U.S.C. section  360bbb-3(b)(1), unless the authorization is terminated or revoked sooner.    Influenza A by PCR NEGATIVE NEGATIVE Final   Influenza B by PCR NEGATIVE NEGATIVE Final    Comment: (NOTE) The Xpert Xpress SARS-CoV-2/FLU/RSV assay is intended as an aid in  the diagnosis of influenza from Nasopharyngeal swab specimens and  should not be used as a sole basis for treatment. Nasal washings and  aspirates are unacceptable for Xpert Xpress SARS-CoV-2/FLU/RSV  testing. Fact Sheet for Patients: PinkCheek.be Fact Sheet for Healthcare Providers: GravelBags.it This test is not yet approved or cleared by the [place] FDA and  has been authorized for detection and/or diagnosis of SARS-CoV-2 by  FDA under an Emergency Use Authorization (EUA). This EUA will remain  in effect (meaning this test can be used) for the duration of the  Covid-19 declaration under Section 564(b)(1) of the Act, 21  U.S.C. section 360bbb-3(b)(1), unless the authorization is  terminated or revoked. Performed at [facility], [address], [place], [place] [address]     Radiology Studies: No results found.  Scheduled Meds: . atorvastatin  20 mg Oral Daily  . Chlorhexidine Gluconate Cloth  6 each Topical Daily  . dorzolamide-timolol  1 drop Both Eyes Daily  . ferrous sulfate  325 mg Oral BID WC  . iohexol      . meperidine      . midazolam      . pantoprazole (PROTONIX) IV  40 mg Intravenous Q24H  . sodium chloride flush  3 mL Intravenous Q12H   Continuous Infusions: . sodium chloride    . cefTRIAXone (ROCEPHIN)  IV 1 g (04/05/20 2005)     LOS: 1 day   Time spent: 22 minutes  Orie Cuttino Wynetta Emery, MD [facility]  To contact the attending provider between 7A-7P or the covering provider during after hours 7P-7A, please log into the web site [email or web address] and access using universal [place] password for that web site. If you do not have the  password, please call the hospital operator.  04/06/2020, 9:24 AM

## 2020-04-06 NOTE — Progress Notes (Signed)
Urine in foley bag is red.  Notified Dr. Wynetta Emery.  He has ordered a urology consult for patient.

## 2020-04-06 NOTE — Op Note (Signed)
[facility] Patient Name: Gabrielle [name] Procedure Date: 04/06/2020 6:59 AM MRN: [id number] Date of Birth: 05-Jan-1946 Attending MD: Hildred Laser , MD CSN: [id number] Age: 74 Admit Type: Outpatient Procedure:                Colonoscopy Indications:              Iron deficiency anemia secondary to chronic blood                            loss Providers:                Hildred Laser, MD, Otis Peak B. Sharon Seller, RN, Aram Candela Referring MD:             Irwin Brakeman, MD Medicines:                Meperidine 40 mg IV, Midazolam 4 mg IV Complications:            No immediate complications. Estimated Blood Loss:     Estimated blood loss was minimal. Procedure:                Pre-Anesthesia Assessment:                           - Prior to the procedure, a History and Physical                            was performed, and patient medications and                            allergies were reviewed. The patient's tolerance of                            previous anesthesia was also reviewed. The risks                            and benefits of the procedure and the sedation                            options and risks were discussed with the patient.                            All questions were answered, and informed consent                            was obtained. Prior Anticoagulants: The patient has                            taken no previous anticoagulant or antiplatelet                            agents. ASA Grade Assessment: II - A patient with  mild systemic disease. After reviewing the risks                            and benefits, the patient was deemed in                            satisfactory condition to undergo the procedure.                           After obtaining informed consent, the colonoscope                            was passed under direct vision. Throughout the                            procedure, the patient's  blood pressure, pulse, and                            oxygen saturations were monitored continuously. The                            [id number]-[id number] ([id number]) scope was introduced through                            the anus and advanced to the the cecum, identified                            by the ileocecal valve. The colonoscopy was                            technically difficult and complex due to a                            redundant colon. The patient tolerated the                            procedure well. The quality of the bowel                            preparation was adequate. The ileocecal valve and                            the rectum were photographed. A portion of the                            colon was not examined. Specifically, the cecum was                            not adequately visualized. Scope In: 7:42:38 AM Scope Out: 8:12:27 AM Scope Withdrawal Time: 0 hours 5 minutes 58 seconds  Total Procedure Duration: 0 hours 29 minutes 49 seconds  Findings:      The perianal and digital rectal examinations were normal.      An infiltrative, polypoid and ulcerated non-obstructing large  mass was       found in the ascending colon. The mass was circumferential. The mass       measured eight cm in length. No bleeding was present. This was biopsied       with a cold forceps for histology.      The exam was otherwise normal throughout the examined colon.      External hemorrhoids were found during retroflexion. The hemorrhoids       were small. Impression:               - Malignant tumor in the ascending colon. Biopsied.                           - External hemorrhoids. Moderate Sedation:      Moderate (conscious) sedation was administered by the endoscopy nurse       and supervised by the endoscopist. The following parameters were       monitored: oxygen saturation, heart rate, blood pressure, CO2       capnography and response to care. Total physician intraservice time was        34 minutes. Recommendation:           - Return patient to hospital ward for ongoing care.                           - Low sodium diet today.                           - Continue present medications.                           - A/P CT today.                           - CEA.                           - Await pathology results.                           - Surgical consultation. Procedure Code(s):        --- Professional ---                           [id number], Colonoscopy, flexible; with biopsy, single                            or multiple                           99153, Moderate sedation; each additional 15                            minutes intraservice time                           G0500, Moderate sedation services provided by the                            same  physician or other qualified health care                            professional performing a gastrointestinal                            endoscopic service that sedation supports,                            requiring the presence of an independent trained                            observer to assist in the monitoring of the                            patient's level of consciousness and physiological                            status; initial 15 minutes of intra-service time;                            patient age 42 years or older (additional time may                            be reported with [phone], as appropriate) Diagnosis Code(s):        --- Professional ---                           C18.2, Malignant neoplasm of ascending colon                           K64.4, Residual hemorrhoidal skin tags                           D50.0, Iron deficiency anemia secondary to blood                            loss (chronic) CPT copyright 2019 American Medical Association. All rights reserved. The codes documented in this report are preliminary and upon coder review may  be revised to meet current compliance requirements. Hildred Laser, MD Hildred Laser, MD 04/06/2020 8:25:48 AM This report has been signed electronically. Number of Addenda: 0

## 2020-04-07 ENCOUNTER — Inpatient Hospital Stay (HOSPITAL_COMMUNITY): Payer: Medicare HMO

## 2020-04-07 DIAGNOSIS — C189 Malignant neoplasm of colon, unspecified: Secondary | ICD-10-CM

## 2020-04-07 DIAGNOSIS — K922 Gastrointestinal hemorrhage, unspecified: Secondary | ICD-10-CM

## 2020-04-07 DIAGNOSIS — E876 Hypokalemia: Secondary | ICD-10-CM

## 2020-04-07 DIAGNOSIS — I10 Essential (primary) hypertension: Secondary | ICD-10-CM | POA: Diagnosis not present

## 2020-04-07 DIAGNOSIS — C182 Malignant neoplasm of ascending colon: Secondary | ICD-10-CM

## 2020-04-07 LAB — COMPREHENSIVE METABOLIC PANEL
ALT: 5 U/L (ref 0–44)
AST: 7 U/L — ABNORMAL LOW (ref 15–41)
Albumin: 2.7 g/dL — ABNORMAL LOW (ref 3.5–5.0)
Alkaline Phosphatase: 56 U/L (ref 38–126)
Anion gap: 8 (ref 5–15)
BUN: 13 mg/dL (ref 8–23)
CO2: 23 mmol/L (ref 22–32)
Calcium: 8.2 mg/dL — ABNORMAL LOW (ref 8.9–10.3)
Chloride: 106 mmol/L (ref 98–111)
Creatinine, Ser: 1.19 mg/dL — ABNORMAL HIGH (ref 0.44–1.00)
GFR calc Af Amer: 52 mL/min — ABNORMAL LOW (ref >60)
GFR calc non Af Amer: 45 mL/min — ABNORMAL LOW (ref >60)
Glucose, Bld: 104 mg/dL — ABNORMAL HIGH (ref 70–99)
Potassium: 3.5 mmol/L (ref 3.5–5.1)
Sodium: 137 mmol/L (ref 135–145)
Total Bilirubin: 0.6 mg/dL (ref 0.3–1.2)
Total Protein: 5.6 g/dL — ABNORMAL LOW (ref 6.5–8.1)

## 2020-04-07 LAB — H. PYLORI ANTIBODY, IGG: H Pylori IgG: 1.03 Index Value — ABNORMAL HIGH (ref 0.00–0.79)

## 2020-04-07 LAB — CBC WITH DIFFERENTIAL/PLATELET
Abs Immature Granulocytes: 0.02 10*3/uL (ref 0.00–0.07)
Basophils Absolute: 0 10*3/uL (ref 0.0–0.1)
Basophils Relative: 0 %
Eosinophils Absolute: 0.3 10*3/uL (ref 0.0–0.5)
Eosinophils Relative: 4 %
HCT: 31.4 % — ABNORMAL LOW (ref 36.0–46.0)
Hemoglobin: 9.5 g/dL — ABNORMAL LOW (ref 12.0–15.0)
Immature Granulocytes: 0 %
Lymphocytes Relative: 15 %
Lymphs Abs: 1.2 10*3/uL (ref 0.7–4.0)
MCH: 26.5 pg (ref 26.0–34.0)
MCHC: 30.3 g/dL (ref 30.0–36.0)
MCV: 87.5 fL (ref 80.0–100.0)
Monocytes Absolute: 0.7 10*3/uL (ref 0.1–1.0)
Monocytes Relative: 9 %
Neutro Abs: 5.7 10*3/uL (ref 1.7–7.7)
Neutrophils Relative %: 72 %
Platelets: 245 10*3/uL (ref 150–400)
RBC: 3.59 MIL/uL — ABNORMAL LOW (ref 3.87–5.11)
RDW: 17.2 % — ABNORMAL HIGH (ref 11.5–15.5)
WBC: 7.9 10*3/uL (ref 4.0–10.5)
nRBC: 0 % (ref 0.0–0.2)

## 2020-04-07 LAB — SURGICAL PCR SCREEN
MRSA, PCR: NEGATIVE
Staphylococcus aureus: NEGATIVE

## 2020-04-07 LAB — ECHOCARDIOGRAM COMPLETE
Height: 62 in
Weight: 2400 oz

## 2020-04-07 LAB — MAGNESIUM: Magnesium: 2 mg/dL (ref 1.7–2.4)

## 2020-04-07 LAB — CEA: CEA: 12.2 ng/mL — ABNORMAL HIGH (ref 0.0–4.7)

## 2020-04-07 MED ORDER — SODIUM CHLORIDE 0.9 % IV SOLN
INTRAVENOUS | Status: AC
  Filled 2020-04-07: qty 2

## 2020-04-07 MED ORDER — GABAPENTIN 300 MG PO CAPS: 300.0000 mg | ORAL_CAPSULE | ORAL | Status: DC

## 2020-04-07 MED ORDER — ALVIMOPAN 12 MG PO CAPS
12.0000 mg | ORAL_CAPSULE | ORAL | Status: AC
  Administered 2020-04-08: 09:00:00 12 mg via ORAL
  Filled 2020-04-07: qty 1

## 2020-04-07 MED ORDER — CHLORHEXIDINE GLUCONATE CLOTH 2 % EX PADS: 6.0000 | MEDICATED_PAD | Freq: Once | CUTANEOUS | Status: DC

## 2020-04-07 MED ORDER — CHLORHEXIDINE GLUCONATE CLOTH 2 % EX PADS
6.0000 | MEDICATED_PAD | Freq: Once | CUTANEOUS | Status: AC
  Administered 2020-04-07: 22:00:00 6 via TOPICAL

## 2020-04-07 MED ORDER — SODIUM CHLORIDE 0.9 % IV SOLN
2.0000 g | INTRAVENOUS | Status: AC
  Administered 2020-04-08: 2 g via INTRAVENOUS
  Filled 2020-04-07: qty 2

## 2020-04-07 MED ORDER — ACETAMINOPHEN 500 MG PO TABS: 1000.0000 mg | ORAL_TABLET | ORAL | Status: DC

## 2020-04-07 NOTE — Plan of Care (Signed)
  Problem: Education: Goal: Knowledge of General Education information will improve Description: Including pain rating scale, medication(s)/side effects and non-pharmacologic comfort measures Outcome: Progressing   Problem: Clinical Measurements: Goal: Ability to maintain clinical measurements within normal limits will improve Outcome: Progressing   Problem: Activity: Goal: Risk for activity intolerance will decrease Outcome: Progressing   Problem: Nutrition: Goal: Adequate nutrition will be maintained Outcome: Progressing   Problem: Elimination: Goal: Will not experience complications related to bowel motility Outcome: Progressing   

## 2020-04-07 NOTE — Progress Notes (Signed)
Biopsy results reviewed with patient and her daughter Angus Palms who is at bedside. Dr. Constance Haw made aware of histologic diagnosis. Immunohistochemical stains pending. Patient's daughters questions answered. We would not noted final stage until after surgery and histologic examination has been completed. McKenzie's note appreciated.  Patient's hematuria started several hours after placement of Foley's catheter.  Patient for cystoscopy and bladder biopsy tomorrow along with colon surgery.

## 2020-04-07 NOTE — Consult Note (Addendum)
I was present with the medical student for this service. I personally verified the history of present illness, performed the physical exam, and made the plan for this encounter. I have verified the medical student's documentation and made modifications where appropriately. I have personally documented in my own words a brief history, physical, and plan below.     Patient with ascending colon cancer found on colonoscopy for anemia. Also with bladder mass/ thickening.   CT personally reviewed and ascending colon thickening consistent with cancer, some lymph nodes enlarged, thickened bladder.   Plan for R hemicolectomy tomorrow. Given the size of the mass and the extent of disease an open procedure will be oncologic in nature. Dr. Alyson Ingles plans to do a cystoscopy and biopsy.  Discussed risk of bleeding, infection, anastomotic leak, injury to other organs, and need for additional treatment or surgery.   Discussed potential for needing blood and risk of getting blood.    Bowel preparation today. Neomycin and Flagyl preop for bowel prep Consent orders signed.  OR tomorrow  Librarian, academic for blood for tomorrow   Curlene Labrum, MD [facility] [place], [place] [address] [phone] (office)     Reason for Consult: Colectomy for colon cancer Referring Physician: Dr. Irwin Brakeman   Gabrielle [name] is an 74 y.o. female.  HPI:  Shaleka Brines presented to the ED with complaints of weakness and fatigue and was found to have a Hgb of 5.7. Over the past year she has noticed intermittent abdominal discomfort with associated nausea and alternating diarrhea and constipation. She has not noticed a change in the caliber of her stool or any blood blood in her stool. She does note over the past month having darker stool than usual but she attributed that to iron supplementation she began taking about a month ago. However, she admits to having very poor  eyesight due to macular degeneration and not being able to see for certain how dark her stool has been and if there has been any blood. She began taking the supplements after a visit to her PCP when her iron level was noted to be low as well as her Hgb. She denies any vomiting, loss of appetite, dizziness, orthostatic symptoms. She has never had a colonoscopy [rior to admission.  In the ED her stool was noted to be melanotic and positive for Hgb. She later had a CT which showed a 6 cm ascending colon mass extending from the ileocecal valve to the hepatic flexure with some enlarged pericolonic lymph nodes, but no hepatic metastasis. Colonoscopy then confirmed a malignant tumor in ascending colon and was biopsied.   CT also showed thickened bladder wall and patient admits to urinary retention and history of numerous UTIs. She is retired from the [employer]. Urology was consulted and are planning cytoscopy and bladder biopsy.   Past Medical History:  Diagnosis Date  . Cataract    OU  . H/O cesarean section   . Hx of tonsillectomy   . Hyperlipidemia   . Hypertension   . Macular degeneration    Exu ARMD OU    Past Surgical History:  Procedure Laterality Date  . BIOPSY  04/06/2020   Procedure: BIOPSY;  Surgeon: Rogene Houston, MD;  [facility];  Service: Endoscopy;;  . CARPAL TUNNEL RELEASE Right   . CATARACT EXTRACTION W/PHACO Left 10/11/2019   Procedure: CATARACT EXTRACTION PHACO AND INTRAOCULAR LENS PLACEMENT (IOC);  Surgeon: Baruch Goldmann, MD;  Location: [facility]  ORS;  Service: Ophthalmology;  Laterality: Left;  CDE: 8.56  . CATARACT EXTRACTION W/PHACO Right 10/25/2019   Procedure: CATARACT EXTRACTION PHACO AND INTRAOCULAR LENS PLACEMENT (IOC);  Surgeon: Baruch Goldmann, MD;  Location: [facility] ORS;  Service: Ophthalmology;  Laterality: Right;  CDE: 5.67  . CESAREAN SECTION    . COLONOSCOPY N/A 04/06/2020   Procedure: COLONOSCOPY;  Surgeon: Rogene Houston, MD;  [facility];   Service: Endoscopy;  Laterality: N/A;  . ESOPHAGOGASTRODUODENOSCOPY N/A 04/05/2020   Procedure: ESOPHAGOGASTRODUODENOSCOPY (EGD);  Surgeon: Rogene Houston, MD;  [facility];  Service: Endoscopy;  Laterality: N/A;  . TONSILLECTOMY      Family History  Problem Relation Age of Onset  . Macular degeneration Mother   . Stroke Mother   . Hypertension Mother   . Dementia Mother   . Cancer Father        lung  . Diabetes Sister   . Hypertension Sister   Patient reports a family history of colon cancer and lung cancer in paternal uncle, uterine cancer in maternal aunt, brain cancer and pancreatic cancer in other relatives.   Social History:  reports that she has never smoked. She has never used smokeless tobacco. She reports that she does not drink alcohol or use drugs. Retired and lives alone. Husband died 50 months ago of lung cancer. Previous worked in [employer].   Allergies:  Allergies  Allergen Reactions  . Lisinopril Cough    Pt states she is not allergic to lisinopril    Medications:  Current Meds  Medication Sig  . alendronate (FOSAMAX) 70 MG tablet Take 1 tablet (70 mg total) by mouth every 7 (seven) days. Take with a full glass of water on an empty stomach.  Marland Kitchen atorvastatin (LIPITOR) 20 MG tablet Take 1 tablet (20 mg total) by mouth daily.  . cholecalciferol (VITAMIN D3) 25 MCG (1000 UT) tablet Take 1,000 Units by mouth daily.  . diclofenac Sodium (VOLTAREN) 1 % GEL Apply 4 g topically 4 (four) times daily.  . dorzolamide-timolol (COSOPT) 22.3-6.8 MG/ML ophthalmic solution Place 1 drop into both eyes daily.  . ferrous sulfate 325 (65 FE) MG EC tablet Take 1 tablet (325 mg total) by mouth 2 (two) times daily with a meal.  . losartan-hydrochlorothiazide (HYZAAR) 100-25 MG tablet TAKE 1 TABLET DAILY.  Needs to be seen for further refills.    Results for orders placed or performed during the hospital encounter of 04/04/20 (from the past 48 hour(s))  Glucose,  capillary     Status: None   Collection Time: 04/05/20  9:13 PM  Result Value Ref Range   Glucose-Capillary 85 70 - 99 mg/dL    Comment: Glucose reference range applies only to samples taken after fasting for at least 8 hours.   Comment 1 Notify RN    Comment 2 Document in Chart   CBC     Status: Abnormal   Collection Time: 04/06/20  5:22 AM  Result Value Ref Range   WBC 8.5 4.0 - 10.5 K/uL   RBC 3.59 (L) 3.87 - 5.11 MIL/uL   Hemoglobin 9.5 (L) 12.0 - 15.0 g/dL    Comment: REPEATED TO VERIFY POST TRANSFUSION SPECIMEN DELTA CHECK NOTED    HCT 30.7 (L) 36.0 - 46.0 %   MCV 85.5 80.0 - 100.0 fL   MCH 26.5 26.0 - 34.0 pg   MCHC 30.9 30.0 - 36.0 g/dL   RDW 16.7 (H) 11.5 - 15.5 %   Platelets 241  150 - 400 K/uL   nRBC 0.0 0.0 - 0.2 %    Comment: Performed at [facility], [address] [place], [place] [address]  Basic metabolic panel     Status: Abnormal   Collection Time: 04/06/20  5:22 AM  Result Value Ref Range   Sodium 139 135 - 145 mmol/L   Potassium 3.4 (L) 3.5 - 5.1 mmol/L   Chloride 105 98 - 111 mmol/L   CO2 26 22 - 32 mmol/L   Glucose, Bld 112 (H) 70 - 99 mg/dL    Comment: Glucose reference range applies only to samples taken after fasting for at least 8 hours.   BUN 24 (H) 8 - 23 mg/dL   Creatinine, Ser 1.35 (H) 0.44 - 1.00 mg/dL   Calcium 8.5 (L) 8.9 - 10.3 mg/dL   GFR calc non Af Amer 39 (L) >60 mL/min   GFR calc Af Amer 45 (L) >60 mL/min   Anion gap 8 5 - 15    Comment: Performed at [facility], [address], [place], [place] [address]  Surgical pathology     Status: None   Collection Time: 04/06/20  8:05 AM  Result Value Ref Range   SURGICAL PATHOLOGY      SURGICAL PATHOLOGY CASE: [id number] PATIENT: Chantel Rabadi Surgical Pathology Report     Clinical History: melena and iron deficiency anemia, no bleeding lesion found on EGD     FINAL MICROSCOPIC DIAGNOSIS:  A. COLON, ASCENDING, BIOPSY: - Adenocarcinoma. - See comment.  COMMENT:  Dr.  Jeannie Done has reviewed the case and concurs with this interpretation. Dr. Laural Golden was paged on 04/07/2020.  MMR by [facility] will be performed and the results reported separately.   GROSS DESCRIPTION:  Received in formalin are tan, soft tissue fragments that are submitted in toto. Number: 7 size: 0.2 to 0.3 cm blocks: 1  SW 04/06/2020   Final Diagnosis performed by Enid Cutter, MD.   Electronically signed 04/07/2020 Technical component performed at [facility], [place] [address], [place], [place] [address].  Professional component performed at [employer] [facility]. [facility], [place] [address] [place], [place] [address].  Immunohistochemistry Technical component (i f applicable) was performed at [facility]. [address], [place], [place], [place] [address].   IMMUNOHISTOCHEMISTRY DISCLAIMER (if applicable): Some of these immunohistochemical stains may have been developed and the performance characteristics determine by [facility]. Some may not have been cleared or approved by the U.S. Food and Drug Administration. The FDA has determined that such clearance or approval is not necessary. This test is used for clinical purposes. It should not be regarded as investigational or for research. This laboratory is certified under the [place] (CLIA-88) as qualified to perform high complexity clinical laboratory testing.  The controls stained appropriately.   Glucose, capillary     Status: None   Collection Time: 04/06/20 11:04 AM  Result Value Ref Range   Glucose-Capillary 85 70 - 99 mg/dL    Comment: Glucose reference range applies only to samples taken after fasting for at least 8 hours.  Glucose, capillary     Status: None   Collection Time: 04/06/20  4:26 PM  Result Value Ref Range   Glucose-Capillary 90 70 - 99 mg/dL    Comment: Glucose reference range applies only to samples taken after fasting for at  least 8 hours.  CBC with Differential/Platelet     Status: Abnormal   Collection Time: 04/07/20  4:58 AM  Result Value Ref Range   WBC 7.9 4.0 - 10.5 K/uL   RBC 3.59 (L) 3.87 - 5.11 MIL/uL   Hemoglobin 9.5 (L) 12.0 - 15.0 g/dL   HCT 31.4 (L) 36.0 - 46.0 %   MCV 87.5 80.0 - 100.0 fL   MCH 26.5 26.0 - 34.0 pg   MCHC 30.3 30.0 - 36.0 g/dL   RDW 17.2 (H) 11.5 - 15.5 %   Platelets 245 150 - 400 K/uL   nRBC 0.0 0.0 - 0.2 %   Neutrophils Relative % 72 %   Neutro Abs 5.7 1.7 - 7.7 K/uL   Lymphocytes Relative 15 %   Lymphs Abs 1.2 0.7 - 4.0 K/uL   Monocytes Relative 9 %   Monocytes Absolute 0.7 0.1 - 1.0 K/uL   Eosinophils Relative 4 %   Eosinophils Absolute 0.3 0.0 - 0.5 K/uL   Basophils Relative 0 %   Basophils Absolute 0.0 0.0 - 0.1 K/uL   Immature Granulocytes 0 %   Abs Immature Granulocytes 0.02 0.00 - 0.07 K/uL    Comment: Performed at [facility], [address], [place], [place] [address]  Comprehensive metabolic panel     Status: Abnormal   Collection Time: 04/07/20  4:58 AM  Result Value Ref Range   Sodium 137 135 - 145 mmol/L   Potassium 3.5 3.5 - 5.1 mmol/L   Chloride 106 98 - 111 mmol/L   CO2 23 22 - 32 mmol/L   Glucose, Bld 104 (H) 70 - 99 mg/dL    Comment: Glucose reference range applies only to samples taken after fasting for at least 8 hours.   BUN 13 8 - 23 mg/dL   Creatinine, Ser 1.19 (H) 0.44 - 1.00 mg/dL   Calcium 8.2 (L) 8.9 - 10.3 mg/dL   Total Protein 5.6 (L) 6.5 - 8.1 g/dL   Albumin 2.7 (L) 3.5 - 5.0 g/dL   AST 7 (L) 15 - 41 U/L   ALT 5 0 - 44 U/L   Alkaline Phosphatase 56 38 - 126 U/L   Total Bilirubin 0.6 0.3 - 1.2 mg/dL   GFR calc non Af Amer 45 (L) >60 mL/min   GFR calc Af Amer 52 (L) >60 mL/min   Anion gap 8 5 - 15    Comment: Performed at [facility], [address], [place], [place] [address]  Magnesium     Status: None   Collection Time: 04/07/20  4:58 AM  Result Value Ref Range   Magnesium 2.0 1.7 - 2.4 mg/dL    Comment: Performed at [facility], [address] [place], [place] [address]      CT ABDOMEN PELVIS W CONTRAST  Result Date: 04/06/2020 CLINICAL DATA:  Ascending colon mass seen on colonoscopy today. EXAM: CT ABDOMEN AND PELVIS WITH CONTRAST TECHNIQUE: Multidetector CT imaging of the abdomen and pelvis was performed using the standard protocol following bolus administration of intravenous contrast. CONTRAST:  47m OMNIPAQUE IOHEXOL 300 MG/ML  SOLN COMPARISON:  None. FINDINGS: Lower chest: The lung bases are clear of an acute process. No infiltrates or effusions. No worrisome pulmonary nodules. The heart is within normal limits in size for age. No pericardial effusion. Mitral valve annular calcifications are noted. Moderate aortic calcifications. The distal esophagus is grossly normal Hepatobiliary: No hepatic lesions are identified to suggest hepatic metastatic disease. The gallbladder is normal. No intra or extrahepatic biliary dilatation. Pancreas: No mass, inflammation or ductal dilatation. Spleen: Normal size. No focal lesions. Adrenals/Urinary Tract:  The adrenal glands and kidneys are unremarkable. No worrisome renal lesions or hydronephrosis. Small renal cysts are noted. Severely thick-walled bladder which contains a Foley catheter. Findings could be due to severe chronic cystitis. I do not see a discrete mass but could not exclude the possibility of infiltrating tumor. Patient may need cystoscopic evaluation unless this is a known entity. Stomach/Bowel: The stomach, duodenum and small bowel are unremarkable. No acute inflammatory changes, mass lesions or obstructive findings. The terminal ileum is normal. The appendix is normal. Fairly extensive circumferential ascending colon mass extending from the ileocecal valve to the hepatic flexure and measuring approximately 6 cm in length. No obstruction. Contrast moves through this area and to the distal colon. There are moderate surrounding interstitial changes and could not exclude  direct invasion of the pericolonic fat. There also numerous borderline enlarged pericolonic lymph nodes which are somewhat worrisome for tumor involvement. Vascular/Lymphatic: Advanced atherosclerotic calcifications involving the aorta and iliac arteries. No enlarged retroperitoneal lymph nodes or upper abdominal lymph nodes. Reproductive: The uterus and ovaries are unremarkable. Other: Small amount of free pelvic fluid is noted. No inguinal mass or adenopathy. Musculoskeletal: No significant bony findings. IMPRESSION: 1. 6 cm circumferential ascending colon mass extending from the ileocecal valve to the hepatic flexure and possibly invading the pericolonic fat. Numerous borderline enlarged pericolonic lymph nodes are somewhat worrisome. 2. No findings for hepatic metastatic disease. 3. Severely thick-walled bladder which contains a Foley catheter. Findings could be due to severe chronic cystitis. Underlying infiltrating tumor is possible. Recommend cystoscopic evaluation unless this is a known entity. 4. Advanced atherosclerotic calcifications involving the aorta and iliac arteries. Aortic Atherosclerosis (ICD10-I70.0). Electronically Signed   By: Marijo Sanes M.D.   On: 04/06/2020 11:56    ROS:  Review of systems negative except what is stated in HPI.   Blood pressure 123/66, pulse 72, temperature 99.1 F (37.3 C), temperature source Oral, resp. rate 18, height '5\' 2"'  (1.575 m), weight 68 kg, SpO2 94 %. Physical Exam: Physical Exam Vitals reviewed.  Constitutional:      Appearance: She is normal weight.  HENT:     Head: Normocephalic and atraumatic.     Nose: Nose normal.     Mouth/Throat:     Mouth: Mucous membranes are moist.  Eyes:     Pupils: Pupils are equal, round, and reactive to light.  Cardiovascular:     Rate and Rhythm: Normal rate and regular rhythm.     Pulses: Normal pulses.     Heart sounds: Normal heart sounds.  Pulmonary:     Effort: Pulmonary effort is normal.     Breath  sounds: Normal breath sounds.  Abdominal:     General: Abdomen is flat. Bowel sounds are normal. There is no distension.     Palpations: Abdomen is soft.     Tenderness: There is abdominal tenderness in the right upper quadrant and right lower quadrant.     Comments: Discomfort/tenderness with deep palpation only.   Musculoskeletal:        General: No swelling. Normal range of motion.     Cervical back: Normal range of motion. No rigidity.  Skin:    General: Skin is warm.  Neurological:     General: No focal deficit present.     Mental Status: She is alert and oriented to person, place, and time.  Psychiatric:        Mood and Affect: Mood normal.        Behavior: Behavior normal.  Thought Content: Thought content normal.        Judgment: Judgment normal.    Assessment/Plan: Naylani Bradner is a 74 year old female with a new diagnosis of colon cancer. Given the size of the mass in ascending colon will proceed with an open colectomy of ascending colon. Cytoscopy and bladder biopsy will be performed by Dr. Alyson Ingles at the time of resection. Risks were discussed with patient and family including bleeding, infection, possible colostomy bag, and possible ureter damage.  - open colectomy tomorrow  - clear liquid diet today; NPO after midnight   Bartholome Bill 04/07/2020, 9:17 AM

## 2020-04-07 NOTE — Progress Notes (Signed)
*  PRELIMINARY RESULTS* Echocardiogram 2D Echocardiogram has been performed.  Gabrielle [name] 04/07/2020, 9:28 AM

## 2020-04-07 NOTE — H&P (View-Only) (Signed)
I was present with the medical student for this service. I personally verified the history of present illness, performed the physical exam, and made the plan for this encounter. I have verified the medical student's documentation and made modifications where appropriately. I have personally documented in my own words a brief history, physical, and plan below.     Patient with ascending colon cancer found on colonoscopy for anemia. Also with bladder mass/ thickening.   CT personally reviewed and ascending colon thickening consistent with cancer, some lymph nodes enlarged, thickened bladder.   Plan for R hemicolectomy tomorrow. Given the size of the mass and the extent of disease an open procedure will be oncologic in nature. Dr. Alyson Ingles plans to do a cystoscopy and biopsy.  Discussed risk of bleeding, infection, anastomotic leak, injury to other organs, and need for additional treatment or surgery.   Discussed potential for needing blood and risk of getting blood.    Bowel preparation today. Neomycin and Flagyl preop for bowel prep Consent orders signed.  OR tomorrow  Librarian, academic for blood for tomorrow   Curlene Labrum, MD [facility] [place], [place] [phone] (office)     Reason for Consult: Colectomy for colon cancer Referring Physician: Dr. Irwin Brakeman   Gabrielle [name] is an 74 y.o. female.  HPI:  Gabrielle [name] presented to the ED with complaints of weakness and fatigue and was found to have a Hgb of 5.7. Over the past year she has noticed intermittent abdominal discomfort with associated nausea and alternating diarrhea and constipation. She has not noticed a change in the caliber of her stool or any blood blood in her stool. She does note over the past month having darker stool than usual but she attributed that to iron supplementation she began taking about a month ago. However, she admits to having very poor  eyesight due to macular degeneration and not being able to see for certain how dark her stool has been and if there has been any blood. She began taking the supplements after a visit to her PCP when her iron level was noted to be low as well as her Hgb. She denies any vomiting, loss of appetite, dizziness, orthostatic symptoms. She has never had a colonoscopy [rior to admission.  In the ED her stool was noted to be melanotic and positive for Hgb. She later had a CT which showed a 6 cm ascending colon mass extending from the ileocecal valve to the hepatic flexure with some enlarged pericolonic lymph nodes, but no hepatic metastasis. Colonoscopy then confirmed a malignant tumor in ascending colon and was biopsied.   CT also showed thickened bladder wall and patient admits to urinary retention and history of numerous UTIs. She is retired from the [employer]. Urology was consulted and are planning cytoscopy and bladder biopsy.   Past Medical History:  Diagnosis Date  . Cataract    OU  . H/O cesarean section   . Hx of tonsillectomy   . Hyperlipidemia   . Hypertension   . Macular degeneration    Exu ARMD OU    Past Surgical History:  Procedure Laterality Date  . BIOPSY  04/06/2020   Procedure: BIOPSY;  Surgeon: Rogene Houston, MD;  [facility];  Service: Endoscopy;;  . CARPAL TUNNEL RELEASE Right   . CATARACT EXTRACTION W/PHACO Left 10/11/2019   Procedure: CATARACT EXTRACTION PHACO AND INTRAOCULAR LENS PLACEMENT (IOC);  Surgeon: Baruch Goldmann, MD;  Location: [facility]  ORS;  Service: Ophthalmology;  Laterality: Left;  CDE: 8.56  . CATARACT EXTRACTION W/PHACO Right 10/25/2019   Procedure: CATARACT EXTRACTION PHACO AND INTRAOCULAR LENS PLACEMENT (IOC);  Surgeon: Baruch Goldmann, MD;  Location: [facility] ORS;  Service: Ophthalmology;  Laterality: Right;  CDE: 5.67  . CESAREAN SECTION    . COLONOSCOPY N/A 04/06/2020   Procedure: COLONOSCOPY;  Surgeon: Rogene Houston, MD;  Location[facility];   Service: Endoscopy;  Laterality: N/A;  . ESOPHAGOGASTRODUODENOSCOPY N/A 04/05/2020   Procedure: ESOPHAGOGASTRODUODENOSCOPY (EGD);  Surgeon: Rogene Houston, MD;  [facility];  Service: Endoscopy;  Laterality: N/A;  . TONSILLECTOMY      Family History  Problem Relation Age of Onset  . Macular degeneration Mother   . Stroke Mother   . Hypertension Mother   . Dementia Mother   . Cancer Father        lung  . Diabetes Sister   . Hypertension Sister   Patient reports a family history of colon cancer and lung cancer in paternal uncle, uterine cancer in maternal aunt, brain cancer and pancreatic cancer in other relatives.   Social History:  reports that she has never smoked. She has never used smokeless tobacco. She reports that she does not drink alcohol or use drugs. Retired and lives alone. Husband died 5 months ago of lung cancer. Previous worked in [employer].   Allergies:  Allergies  Allergen Reactions  . Lisinopril Cough    Pt states she is not allergic to lisinopril    Medications:  Current Meds  Medication Sig  . alendronate (FOSAMAX) 70 MG tablet Take 1 tablet (70 mg total) by mouth every 7 (seven) days. Take with a full glass of water on an empty stomach.  Marland Kitchen atorvastatin (LIPITOR) 20 MG tablet Take 1 tablet (20 mg total) by mouth daily.  . cholecalciferol (VITAMIN D3) 25 MCG (1000 UT) tablet Take 1,000 Units by mouth daily.  . diclofenac Sodium (VOLTAREN) 1 % GEL Apply 4 g topically 4 (four) times daily.  . dorzolamide-timolol (COSOPT) 22.3-6.8 MG/ML ophthalmic solution Place 1 drop into both eyes daily.  . ferrous sulfate 325 (65 FE) MG EC tablet Take 1 tablet (325 mg total) by mouth 2 (two) times daily with a meal.  . losartan-hydrochlorothiazide (HYZAAR) 100-25 MG tablet TAKE 1 TABLET DAILY.  Needs to be seen for further refills.    Results for orders placed or performed during the hospital encounter of 04/04/20 (from the past 48 hour(s))  Glucose,  capillary     Status: None   Collection Time: 04/05/20  9:13 PM  Result Value Ref Range   Glucose-Capillary 85 70 - 99 mg/dL    Comment: Glucose reference range applies only to samples taken after fasting for at least 8 hours.   Comment 1 Notify RN    Comment 2 Document in Chart   CBC     Status: Abnormal   Collection Time: 04/06/20  5:22 AM  Result Value Ref Range   WBC 8.5 4.0 - 10.5 K/uL   RBC 3.59 (L) 3.87 - 5.11 MIL/uL   Hemoglobin 9.5 (L) 12.0 - 15.0 g/dL    Comment: REPEATED TO VERIFY POST TRANSFUSION SPECIMEN DELTA CHECK NOTED    HCT 30.7 (L) 36.0 - 46.0 %   MCV 85.5 80.0 - 100.0 fL   MCH 26.5 26.0 - 34.0 pg   MCHC 30.9 30.0 - 36.0 g/dL   RDW 16.7 (H) 11.5 - 15.5 %   Platelets 241  150 - 400 K/uL   nRBC 0.0 0.0 - 0.2 %    Comment: Performed at [facility], [address] [place], [place] [address]  Basic metabolic panel     Status: Abnormal   Collection Time: 04/06/20  5:22 AM  Result Value Ref Range   Sodium 139 135 - 145 mmol/L   Potassium 3.4 (L) 3.5 - 5.1 mmol/L   Chloride 105 98 - 111 mmol/L   CO2 26 22 - 32 mmol/L   Glucose, Bld 112 (H) 70 - 99 mg/dL    Comment: Glucose reference range applies only to samples taken after fasting for at least 8 hours.   BUN 24 (H) 8 - 23 mg/dL   Creatinine, Ser 1.35 (H) 0.44 - 1.00 mg/dL   Calcium 8.5 (L) 8.9 - 10.3 mg/dL   GFR calc non Af Amer 39 (L) >60 mL/min   GFR calc Af Amer 45 (L) >60 mL/min   Anion gap 8 5 - 15    Comment: Performed at [facility], [address] [place], [place] [address]  Surgical pathology     Status: None   Collection Time: 04/06/20  8:05 AM  Result Value Ref Range   SURGICAL PATHOLOGY      SURGICAL PATHOLOGY CASE: [id number] PATIENT: Gabrielle [name] Surgical Pathology Report     Clinical History: melena and iron deficiency anemia, no bleeding lesion found on EGD     FINAL MICROSCOPIC DIAGNOSIS:  A. COLON, ASCENDING, BIOPSY: - Adenocarcinoma. - See comment.  COMMENT:  Dr.  Jeannie Done has reviewed the case and concurs with this interpretation. Dr. Laural Golden was paged on 04/07/2020.  MMR by [facility] will be performed and the results reported separately.   GROSS DESCRIPTION:  Received in formalin are tan, soft tissue fragments that are submitted in toto. Number: 7 size: 0.2 to 0.3 cm blocks: 1  SW 04/06/2020   Final Diagnosis performed by Enid Cutter, MD.   Electronically signed 04/07/2020 Technical component performed at [facility], [place] [address], [place], [place] [address].  Professional component performed at [employer]. [facility], [place] [address] [place], [place] [address].  Immunohistochemistry Technical component (i f applicable) was performed at [facility]. [address], [place], [place], [place] [address].   IMMUNOHISTOCHEMISTRY DISCLAIMER (if applicable): Some of these immunohistochemical stains may have been developed and the performance characteristics determine by [facility]. Some may not have been cleared or approved by the U.S. Food and Drug Administration. The FDA has determined that such clearance or approval is not necessary. This test is used for clinical purposes. It should not be regarded as investigational or for research. This laboratory is certified under the [place] (CLIA-88) as qualified to perform high complexity clinical laboratory testing.  The controls stained appropriately.   Glucose, capillary     Status: None   Collection Time: 04/06/20 11:04 AM  Result Value Ref Range   Glucose-Capillary 85 70 - 99 mg/dL    Comment: Glucose reference range applies only to samples taken after fasting for at least 8 hours.  Glucose, capillary     Status: None   Collection Time: 04/06/20  4:26 PM  Result Value Ref Range   Glucose-Capillary 90 70 - 99 mg/dL    Comment: Glucose reference range applies only to samples taken after fasting for at  least 8 hours.  CBC with Differential/Platelet     Status: Abnormal   Collection Time: 04/07/20  4:58 AM  Result Value Ref Range   WBC 7.9 4.0 - 10.5 K/uL   RBC 3.59 (L) 3.87 - 5.11 MIL/uL   Hemoglobin 9.5 (L) 12.0 - 15.0 g/dL   HCT 31.4 (L) 36.0 - 46.0 %   MCV 87.5 80.0 - 100.0 fL   MCH 26.5 26.0 - 34.0 pg   MCHC 30.3 30.0 - 36.0 g/dL   RDW 17.2 (H) 11.5 - 15.5 %   Platelets 245 150 - 400 K/uL   nRBC 0.0 0.0 - 0.2 %   Neutrophils Relative % 72 %   Neutro Abs 5.7 1.7 - 7.7 K/uL   Lymphocytes Relative 15 %   Lymphs Abs 1.2 0.7 - 4.0 K/uL   Monocytes Relative 9 %   Monocytes Absolute 0.7 0.1 - 1.0 K/uL   Eosinophils Relative 4 %   Eosinophils Absolute 0.3 0.0 - 0.5 K/uL   Basophils Relative 0 %   Basophils Absolute 0.0 0.0 - 0.1 K/uL   Immature Granulocytes 0 %   Abs Immature Granulocytes 0.02 0.00 - 0.07 K/uL    Comment: Performed at [facility], [address] [place], [place] [address]  Comprehensive metabolic panel     Status: Abnormal   Collection Time: 04/07/20  4:58 AM  Result Value Ref Range   Sodium 137 135 - 145 mmol/L   Potassium 3.5 3.5 - 5.1 mmol/L   Chloride 106 98 - 111 mmol/L   CO2 23 22 - 32 mmol/L   Glucose, Bld 104 (H) 70 - 99 mg/dL    Comment: Glucose reference range applies only to samples taken after fasting for at least 8 hours.   BUN 13 8 - 23 mg/dL   Creatinine, Ser 1.19 (H) 0.44 - 1.00 mg/dL   Calcium 8.2 (L) 8.9 - 10.3 mg/dL   Total Protein 5.6 (L) 6.5 - 8.1 g/dL   Albumin 2.7 (L) 3.5 - 5.0 g/dL   AST 7 (L) 15 - 41 U/L   ALT 5 0 - 44 U/L   Alkaline Phosphatase 56 38 - 126 U/L   Total Bilirubin 0.6 0.3 - 1.2 mg/dL   GFR calc non Af Amer 45 (L) >60 mL/min   GFR calc Af Amer 52 (L) >60 mL/min   Anion gap 8 5 - 15    Comment: Performed at [facility], [address] [place], [place] [address]  Magnesium     Status: None   Collection Time: 04/07/20  4:58 AM  Result Value Ref Range   Magnesium 2.0 1.7 - 2.4 mg/dL    Comment: Performed at [facility], [address], [place],  [address]      CT ABDOMEN PELVIS W CONTRAST  Result Date: 04/06/2020 CLINICAL DATA:  Ascending colon mass seen on colonoscopy today. EXAM: CT ABDOMEN AND PELVIS WITH CONTRAST TECHNIQUE: Multidetector CT imaging of the abdomen and pelvis was performed using the standard protocol following bolus administration of intravenous contrast. CONTRAST:  44m OMNIPAQUE IOHEXOL 300 MG/ML  SOLN COMPARISON:  None. FINDINGS: Lower chest: The lung bases are clear of an acute process. No infiltrates or effusions. No worrisome pulmonary nodules. The heart is within normal limits in size for age. No pericardial effusion. Mitral valve annular calcifications are noted. Moderate aortic calcifications. The distal esophagus is grossly normal Hepatobiliary: No hepatic lesions are identified to suggest hepatic metastatic disease. The gallbladder is normal. No intra or extrahepatic biliary dilatation. Pancreas: No mass, inflammation or ductal dilatation. Spleen: Normal size. No focal lesions. Adrenals/Urinary Tract:  The adrenal glands and kidneys are unremarkable. No worrisome renal lesions or hydronephrosis. Small renal cysts are noted. Severely thick-walled bladder which contains a Foley catheter. Findings could be due to severe chronic cystitis. I do not see a discrete mass but could not exclude the possibility of infiltrating tumor. Patient may need cystoscopic evaluation unless this is a known entity. Stomach/Bowel: The stomach, duodenum and small bowel are unremarkable. No acute inflammatory changes, mass lesions or obstructive findings. The terminal ileum is normal. The appendix is normal. Fairly extensive circumferential ascending colon mass extending from the ileocecal valve to the hepatic flexure and measuring approximately 6 cm in length. No obstruction. Contrast moves through this area and to the distal colon. There are moderate surrounding interstitial changes and could not exclude  direct invasion of the pericolonic fat. There also numerous borderline enlarged pericolonic lymph nodes which are somewhat worrisome for tumor involvement. Vascular/Lymphatic: Advanced atherosclerotic calcifications involving the aorta and iliac arteries. No enlarged retroperitoneal lymph nodes or upper abdominal lymph nodes. Reproductive: The uterus and ovaries are unremarkable. Other: Small amount of free pelvic fluid is noted. No inguinal mass or adenopathy. Musculoskeletal: No significant bony findings. IMPRESSION: 1. 6 cm circumferential ascending colon mass extending from the ileocecal valve to the hepatic flexure and possibly invading the pericolonic fat. Numerous borderline enlarged pericolonic lymph nodes are somewhat worrisome. 2. No findings for hepatic metastatic disease. 3. Severely thick-walled bladder which contains a Foley catheter. Findings could be due to severe chronic cystitis. Underlying infiltrating tumor is possible. Recommend cystoscopic evaluation unless this is a known entity. 4. Advanced atherosclerotic calcifications involving the aorta and iliac arteries. Aortic Atherosclerosis (ICD10-I70.0). Electronically Signed   By: Marijo Sanes M.D.   On: 04/06/2020 11:56    ROS:  Review of systems negative except what is stated in HPI.   Blood pressure 123/66, pulse 72, temperature 99.1 F (37.3 C), temperature source Oral, resp. rate 18, height '5\' 2"'  (1.575 m), weight 68 kg, SpO2 94 %. Physical Exam: Physical Exam Vitals reviewed.  Constitutional:      Appearance: She is normal weight.  HENT:     Head: Normocephalic and atraumatic.     Nose: Nose normal.     Mouth/Throat:     Mouth: Mucous membranes are moist.  Eyes:     Pupils: Pupils are equal, round, and reactive to light.  Cardiovascular:     Rate and Rhythm: Normal rate and regular rhythm.     Pulses: Normal pulses.     Heart sounds: Normal heart sounds.  Pulmonary:     Effort: Pulmonary effort is normal.     Breath  sounds: Normal breath sounds.  Abdominal:     General: Abdomen is flat. Bowel sounds are normal. There is no distension.     Palpations: Abdomen is soft.     Tenderness: There is abdominal tenderness in the right upper quadrant and right lower quadrant.     Comments: Discomfort/tenderness with deep palpation only.   Musculoskeletal:        General: No swelling. Normal range of motion.     Cervical back: Normal range of motion. No rigidity.  Skin:    General: Skin is warm.  Neurological:     General: No focal deficit present.     Mental Status: She is alert and oriented to person, place, and time.  Psychiatric:        Mood and Affect: Mood normal.        Behavior: Behavior normal.  Thought Content: Thought content normal.        Judgment: Judgment normal.    Assessment/Plan: Gabrielle [name] is a 74 year old female with a new diagnosis of colon cancer. Given the size of the mass in ascending colon will proceed with an open colectomy of ascending colon. Cytoscopy and bladder biopsy will be performed by Dr. Alyson Ingles at the time of resection. Risks were discussed with patient and family including bleeding, infection, possible colostomy bag, and possible ureter damage.  - open colectomy tomorrow  - clear liquid diet today; NPO after midnight   Bartholome Bill 04/07/2020, 9:17 AM

## 2020-04-07 NOTE — TOC Initial Note (Signed)
Transition of Care [facility]) - Initial/Assessment Note    Patient Details  Name: Gabrielle [name] MRN: [id number] Date of Birth: Apr 08, 1946  Transition of Care [facility]) CM/SW Contact:    Boneta Lucks, RN Phone Number: 04/07/2020, 1:43 PM  Clinical Narrative:      Patient admitted with symptomatic anemia. GI found colon tumor, patient will go to OR tomorrow. Patient lives at home alone. TOC spoke with Daughter - Ashok Norris. She wishes to take patient to her house [address] in [place], [place].  She is requesting Home health for PT/ RN/AIde. TOC is waiting for home health to return call. TOC to follow.             Expected Discharge Plan: [place] Barriers to Discharge: Continued Medical Work up   Patient Goals and CMS Choice Patient states their goals for this hospitalization and ongoing recovery are:: to go home. CMS Medicare.gov Compare Post Acute Care list provided to:: Patient Represenative (must comment) Choice offered to / list presented to : Adult Children  Expected Discharge Plan and Services Expected Discharge Plan: [place]    Living arrangements for the past 2 months: Single Family Home                      Prior Living Arrangements/Services Living arrangements for the past 2 months: Single Family Home Lives with:: Self          Need for Family Participation in Patient Care: Yes (Comment) Care giver support system in place?: Yes (comment)   Criminal Activity/Legal Involvement Pertinent to Current Situation/Hospitalization: No - Comment as needed  Activities of Daily Living Home Assistive Devices/Equipment: None ADL Screening (condition at time of admission) Patient's cognitive ability adequate to safely complete daily activities?: Yes Is the patient deaf or have difficulty hearing?: Yes Does the patient have difficulty seeing, even when wearing glasses/contacts?: No Does the patient have difficulty concentrating, remembering, or  making decisions?: No Patient able to express need for assistance with ADLs?: Yes Does the patient have difficulty dressing or bathing?: No Independently performs ADLs?: Yes (appropriate for developmental age) Does the patient have difficulty walking or climbing stairs?: No Weakness of Legs: None Weakness of Arms/Hands: None     Alcohol / Substance Use: Not Applicable Psych Involvement: No (comment)  Admission diagnosis:  Hypokalemia [E87.6] Symptomatic anemia [D64.9] Gastrointestinal hemorrhage, unspecified gastrointestinal hemorrhage type [K92.2] Patient Active Problem List   Diagnosis Date Noted  . Macular degeneration   . Symptomatic anemia 04/04/2020  . Anemia 08/28/2019  . High risk for hip fracture 06/18/2019  . Osteopenia after menopause 06/18/2019  . Stage 3 chronic kidney disease 06/18/2019  . Essential hypertension 05/15/2019  . Other hyperlipidemia 05/15/2019   PCP:  Janora Norlander, DO Pharmacy:   [facility], [place] [address] Phone: [phone] Fax: [phone]

## 2020-04-07 NOTE — Progress Notes (Signed)
PROGRESS NOTE   Gabrielle [name]  IWP:[id number] DOB: 11-08-1946 DOA: 04/04/2020 PCP: Janora Norlander, DO   Chief Complaint  Patient presents with   Weakness    Brief Narrative:  74 year old female with hypertension CKD stage III, cataracts and poor vision acuity presented with symptomatic anemia.  Patient is status post 2 units PRBC transfusion.  Assessment & Plan:   Principal Problem:   Symptomatic anemia Active Problems:   Essential hypertension   Other hyperlipidemia   Osteopenia after menopause   Stage 3 chronic kidney disease   Macular degeneration   Malignant neoplasm of ascending colon ([place])  1. Adenocarcinoma of colon - appreciate surgery consult, planning for operative management 04/08/20. Preop echo reviewed normal EF grade 1 DD.  2. Symptomatic anemia-patient is now status post 2 units PRBC.  Hg improved to 9.5.  She had an EGD with Dr. Laural Golden but no source of bleeding was found.  Colonoscopy 04/06/20 with ascending colon mass, biopsies confirm adenocarcinoma.   3. Severe iron deficiency anemia-chronic blood loss anemia from presumed colon cancer. 4. UTI-treated with ceftriaxone IV. 5. Acute urinary retention with bladder wall distension-she developed acute urinary retention requiring I/O cath and subsequent foley cath placement.  Urology was consulted.  Urology says she will likely go home with foley.   Urology will perform cystoscopy and biopsies in OR with Dr. Constance Haw on 04/08/20.  6. Macular degeneration with vision impairment-patient reports she is followed closely by her ophthalmologist who is giving her treatments for this. 7. Hematuria - occurred after foley was placed, urology planning cystoscopy and biopsies 04/08/20.   DVT prophylaxis: SCDs Code Status: Full Family Communication: Spoke with daughter at bedside  Disposition:   Status is: Inpatient  Remains inpatient appropriate because:Ongoing diagnostic testing needed not appropriate for outpatient work  up   Dispo: The patient is from: Home              Anticipated d/c is to: Home              Anticipated d/c date is: 3 days              Patient currently is not medically stable to d/c.   Consultants:   GI  Procedures:   EGD 04/05/20  Antimicrobials: ceftriaxone     Subjective: Patient has no specific complaints. She is agreeable to proceed with planned surgery for 04/08/20.   Objective: Vitals:   04/06/20 2028 04/07/20 0014 04/07/20 0504 04/07/20 1346  BP: (!) 147/57 127/68 123/66 (!) 155/64  Pulse: 73 78 72 76  Resp: 20 16 18 17   Temp: 99.3 F (37.4 C) 98.6 F (37 C) 99.1 F (37.3 C) 98.6 F (37 C)  TempSrc: Oral Oral Oral Oral  SpO2: 98% 98% 94% 100%  Weight:      Height:        Intake/Output Summary (Last 24 hours) at 04/07/2020 1451 Last data filed at 04/07/2020 1300 Gross per 24 hour  Intake 943 ml  Output 1500 ml  Net -557 ml   Filed Weights   04/04/20 1509 04/04/20 2118 04/06/20 0658  Weight: 68 kg 64.6 kg 68 kg    Examination:  General exam: Appears calm and comfortable.  Functional blindness. Respiratory system: Clear to auscultation. Respiratory effort normal. Cardiovascular system: S1 & S2 heard, RRR. No JVD, murmurs, rubs, gallops or clicks. No pedal edema. Gastrointestinal system: Abdomen is nondistended, soft and nontender. No organomegaly or masses felt. Normal bowel sounds heard. Central nervous system: Alert  and oriented. No focal neurological deficits. Extremities: Symmetric 5 x 5 power. Skin: No rashes, lesions or ulcers Psychiatry: Judgement and insight appear normal. Mood & affect appropriate.   Data Reviewed: I have personally reviewed following labs and imaging studies  CBC: Recent Labs  Lab 04/04/20 1519 04/05/20 0618 04/06/20 0522 04/07/20 0458  WBC 7.8 6.4 8.5 7.9  NEUTROABS 6.0  --   --  5.7  HGB 5.7* 7.1* 9.5* 9.5*  HCT 20.1* 24.3* 30.7* 31.4*  MCV 85.5 85.9 85.5 87.5  PLT 257 232 241 503    Basic Metabolic  Panel: Recent Labs  Lab 04/04/20 1519 04/05/20 0618 04/06/20 0522 04/07/20 0458  NA 134* 137 139 137  K 3.0* 3.7 3.4* 3.5  CL 99 103 105 106  CO2 26 26 26 23   GLUCOSE 128* 107* 112* 104*  BUN 36* 35* 24* 13  CREATININE 1.59* 1.51* 1.35* 1.19*  CALCIUM 8.7* 8.5* 8.5* 8.2*  MG 2.4  --   --  2.0    GFR: Estimated Creatinine Clearance: 38.1 mL/min (A) (by C-G formula based on SCr of 1.19 mg/dL (H)).  Liver Function Tests: Recent Labs  Lab 04/04/20 1519 04/07/20 0458  AST 11* 7*  ALT 6 5  ALKPHOS 62 56  BILITOT 0.5 0.6  PROT 7.0 5.6*  ALBUMIN 3.4* 2.7*    CBG: Recent Labs  Lab 04/05/20 2113 04/06/20 1104 04/06/20 1626  GLUCAP 85 85 90     Recent Results (from the past 240 hour(s))  Respiratory Panel by RT PCR (Flu A&B, Covid) - Nasopharyngeal Swab     Status: None   Collection Time: 04/04/20  3:37 PM   Specimen: Nasopharyngeal Swab  Result Value Ref Range Status   SARS Coronavirus 2 by RT PCR NEGATIVE NEGATIVE Final    Comment: (NOTE) SARS-CoV-2 target nucleic acids are NOT DETECTED. The SARS-CoV-2 RNA is generally detectable in upper respiratoy specimens during the acute phase of infection. The lowest concentration of SARS-CoV-2 viral copies this assay can detect is 131 copies/mL. A negative result does not preclude SARS-Cov-2 infection and should not be used as the sole basis for treatment or other patient management decisions. A negative result may occur with  improper specimen collection/handling, submission of specimen other than nasopharyngeal swab, presence of viral mutation(s) within the areas targeted by this assay, and inadequate number of viral copies (<131 copies/mL). A negative result must be combined with clinical observations, patient history, and epidemiological information. The expected result is Negative. Fact Sheet for Patients:  PinkCheek.be Fact Sheet for Healthcare Providers:   GravelBags.it This test is not yet ap proved or cleared by the [place] FDA and  has been authorized for detection and/or diagnosis of SARS-CoV-2 by FDA under an Emergency Use Authorization (EUA). This EUA will remain  in effect (meaning this test can be used) for the duration of the COVID-19 declaration under Section 564(b)(1) of the Act, 21 U.S.C. section 360bbb-3(b)(1), unless the authorization is terminated or revoked sooner.    Influenza A by PCR NEGATIVE NEGATIVE Final   Influenza B by PCR NEGATIVE NEGATIVE Final    Comment: (NOTE) The Xpert Xpress SARS-CoV-2/FLU/RSV assay is intended as an aid in  the diagnosis of influenza from Nasopharyngeal swab specimens and  should not be used as a sole basis for treatment. Nasal washings and  aspirates are unacceptable for Xpert Xpress SARS-CoV-2/FLU/RSV  testing. Fact Sheet for Patients: PinkCheek.be Fact Sheet for Healthcare Providers: GravelBags.it This test is not yet approved or cleared  by the [place] and  has been authorized for detection and/or diagnosis of SARS-CoV-2 by  FDA under an Emergency Use Authorization (EUA). This EUA will remain  in effect (meaning this test can be used) for the duration of the  Covid-19 declaration under Section 564(b)(1) of the Act, 21  U.S.C. section 360bbb-3(b)(1), unless the authorization is  terminated or revoked. Performed at [facility], [address] [place], [place] [address]   Urine culture     Status: None   Collection Time: 04/04/20  5:59 PM   Specimen: Urine, Clean Catch  Result Value Ref Range Status   Specimen Description   Final    URINE, CLEAN CATCH Performed at [facility], [address], [place], [place] [address]    Special Requests   Final    NONE Performed at [facility], [address], [place], [place] [address]    Culture   Final    NO GROWTH Performed at [facility], [place] [address] [place], [place] [address]    Report Status 04/06/2020 FINAL  Final    Radiology Studies: CT ABDOMEN PELVIS W CONTRAST  Result Date: 04/06/2020 CLINICAL DATA:  Ascending colon mass seen on colonoscopy today. EXAM: CT ABDOMEN AND PELVIS WITH CONTRAST TECHNIQUE: Multidetector CT imaging of the abdomen and pelvis was performed using the standard protocol following bolus administration of intravenous contrast. CONTRAST:  30mL OMNIPAQUE IOHEXOL 300 MG/ML  SOLN COMPARISON:  None. FINDINGS: Lower chest: The lung bases are clear of an acute process. No infiltrates or effusions. No worrisome pulmonary nodules. The heart is within normal limits in size for age. No pericardial effusion. Mitral valve annular calcifications are noted. Moderate aortic calcifications. The distal esophagus is grossly normal Hepatobiliary: No hepatic lesions are identified to suggest hepatic metastatic disease. The gallbladder is normal. No intra or extrahepatic biliary dilatation. Pancreas: No mass, inflammation or ductal dilatation. Spleen: Normal size. No focal lesions. Adrenals/Urinary Tract: The adrenal glands and kidneys are unremarkable. No worrisome renal lesions or hydronephrosis. Small renal cysts are noted. Severely thick-walled bladder which contains a Foley catheter. Findings could be due to severe chronic cystitis. I do not see a discrete mass but could not exclude the possibility of infiltrating tumor. Patient may need cystoscopic evaluation unless this is a known entity. Stomach/Bowel: The stomach, duodenum and small bowel are unremarkable. No acute inflammatory changes, mass lesions or obstructive findings. The terminal ileum is normal. The appendix is normal. Fairly extensive circumferential ascending colon mass extending from the ileocecal valve to the hepatic flexure and measuring approximately 6 cm in length. No obstruction. Contrast moves through this area and to the distal colon. There are  moderate surrounding interstitial changes and could not exclude direct invasion of the pericolonic fat. There also numerous borderline enlarged pericolonic lymph nodes which are somewhat worrisome for tumor involvement. Vascular/Lymphatic: Advanced atherosclerotic calcifications involving the aorta and iliac arteries. No enlarged retroperitoneal lymph nodes or upper abdominal lymph nodes. Reproductive: The uterus and ovaries are unremarkable. Other: Small amount of free pelvic fluid is noted. No inguinal mass or adenopathy. Musculoskeletal: No significant bony findings. IMPRESSION: 1. 6 cm circumferential ascending colon mass extending from the ileocecal valve to the hepatic flexure and possibly invading the pericolonic fat. Numerous borderline enlarged pericolonic lymph nodes are somewhat worrisome. 2. No findings for hepatic metastatic disease. 3. Severely thick-walled bladder which contains a Foley catheter. Findings could be due to severe chronic cystitis. Underlying infiltrating tumor is possible. Recommend cystoscopic evaluation  unless this is a known entity. 4. Advanced atherosclerotic calcifications involving the aorta and iliac arteries. Aortic Atherosclerosis (ICD10-I70.0). Electronically Signed   By: Marijo Sanes M.D.   On: 04/06/2020 11:56   ECHOCARDIOGRAM COMPLETE  Result Date: 04/07/2020    ECHOCARDIOGRAM REPORT   Patient Name:   Gabrielle [name] Date of Exam: 04/07/2020 Medical Rec #:  [id number]       Height:       62.0 in Accession #:    [id number]      Weight:       150.0 lb Date of Birth:  09/15/1946      BSA:          1.692 m Patient Age:    14 years        BP:           123/66 mmHg Patient Gender: F               HR:           72 bpm. Exam Location:  [facility] Procedure: 2D Echo Indications:    Preoperative evaluation  History:        Patient has no prior history of Echocardiogram examinations.                 Risk Factors:Hypertension, Dyslipidemia and Non-Smoker. Stage 3                  chronic kidney disease, Macular degeneration.  Sonographer:    Leavy Cella RDCS (AE) Referring Phys: [place]  1. Left ventricular ejection fraction, by estimation, is 65 to 70%. The left ventricle has normal function. The left ventricle has no regional wall motion abnormalities. There is mild left ventricular hypertrophy. Left ventricular diastolic parameters are consistent with Grade I diastolic dysfunction (impaired relaxation).  2. Right ventricular systolic function is normal. The right ventricular size is normal. Tricuspid regurgitation signal is inadequate for assessing PA pressure.  3. Left atrial size was moderately dilated.  4. The mitral valve is grossly normal. Trivial mitral valve regurgitation.  5. The aortic valve is tricuspid. Aortic valve regurgitation is trivial. Mild aortic valve sclerosis is present, with no evidence of aortic valve stenosis.  6. The inferior vena cava is normal in size with greater than 50% respiratory variability, suggesting right atrial pressure of 3 mmHg. FINDINGS  Left Ventricle: Left ventricular ejection fraction, by estimation, is 65 to 70%. The left ventricle has normal function. The left ventricle has no regional wall motion abnormalities. The left ventricular internal cavity size was normal in size. There is  mild left ventricular hypertrophy. Left ventricular diastolic parameters are consistent with Grade I diastolic dysfunction (impaired relaxation). Right Ventricle: The right ventricular size is normal. No increase in right ventricular wall thickness. Right ventricular systolic function is normal. Tricuspid regurgitation signal is inadequate for assessing PA pressure. Left Atrium: Left atrial size was moderately dilated. Right Atrium: Right atrial size was normal in size. Pericardium: There is no evidence of pericardial effusion. Presence of pericardial fat pad. Mitral Valve: The mitral valve is grossly normal. Mild mitral annular  calcification. Trivial mitral valve regurgitation. Tricuspid Valve: The tricuspid valve is grossly normal. Tricuspid valve regurgitation is trivial. Aortic Valve: The aortic valve is tricuspid. Aortic valve regurgitation is trivial. Mild aortic valve sclerosis is present, with no evidence of aortic valve stenosis. Mild to moderate aortic valve annular calcification. Pulmonic Valve: The pulmonic valve was grossly normal. Pulmonic valve regurgitation is  trivial. Aorta: The aortic root is normal in size and structure. Venous: The inferior vena cava is normal in size with greater than 50% respiratory variability, suggesting right atrial pressure of 3 mmHg. IAS/Shunts: No atrial level shunt detected by color flow Doppler. Rozann Lesches MD Electronically signed by Rozann Lesches MD Signature Date/Time: 04/07/2020/10:04:33 AM    Final     Scheduled Meds:  atorvastatin  20 mg Oral Daily   Chlorhexidine Gluconate Cloth  6 each Topical Daily   dorzolamide-timolol  1 drop Both Eyes Daily   ferrous sulfate  325 mg Oral BID WC   metroNIDAZOLE  1,000 mg Oral Once   Followed by   metroNIDAZOLE  1,000 mg Oral Once   neomycin  1,000 mg Oral Once   Followed by   neomycin  1,000 mg Oral Once   pantoprazole (PROTONIX) IV  40 mg Intravenous Q24H   polyethylene glycol  15 packet Oral Once   sodium chloride flush  3 mL Intravenous Q12H   Continuous Infusions:  sodium chloride       LOS: 2 days   Time spent: 19 minutes  Nimra Puccinelli Wynetta Emery, MD [facility]  To contact the attending provider between 7A-7P or the covering provider during after hours 7P-7A, please log into the web site [email or web address] and access using universal [place] password for that web site. If you do not have the password, please call the hospital operator.  04/07/2020, 2:51 PM

## 2020-04-07 NOTE — TOC Transition Note (Signed)
Transition of Care [facility]) - CM/SW Discharge Note  Patient Details  Name: EMERSEN MASCARI MRN: [id number] Date of Birth: Mar 06, 1946  [facility] CM/SW Contact:  Sherie Don, LCSW Phone Number: 04/07/2020, 4:02 PM  Clinical Narrative: CSW called Tim with [facility] to find out about [facility] aide availability. Per Octavia Bruckner, [facility]'s aide's schedule is backed up at this time. CSW called Georgina Snell with [facility] to follow up about [facility] aide availability. Georgina Snell reported [facility] currently does have an aide available.  Barriers to Discharge: Continued Medical Work up  Patient Goals and CMS Choice Patient states their goals for this hospitalization and ongoing recovery are:: to go home. CMS Medicare.gov Compare Post Acute Care list provided to:: Patient Represenative (must comment) Choice offered to / list presented to : Adult Children  Readmission Risk Interventions No flowsheet data found.

## 2020-04-08 ENCOUNTER — Encounter (HOSPITAL_COMMUNITY)
Admission: EM | Disposition: A | Discharge status: Home Health | Payer: Self-pay | Source: Home / Self Care | Attending: Internal Medicine

## 2020-04-08 ENCOUNTER — Inpatient Hospital Stay (HOSPITAL_COMMUNITY): Payer: Medicare HMO | Admitting: Anesthesiology

## 2020-04-08 ENCOUNTER — Encounter (HOSPITAL_COMMUNITY): Payer: Self-pay | Admitting: Family Medicine

## 2020-04-08 DIAGNOSIS — N3289 Other specified disorders of bladder: Secondary | ICD-10-CM

## 2020-04-08 DIAGNOSIS — N1831 Chronic kidney disease, stage 3a: Secondary | ICD-10-CM

## 2020-04-08 HISTORY — PX: PARTIAL COLECTOMY: SHX5273

## 2020-04-08 HISTORY — PX: CYSTOSCOPY WITH BIOPSY: SHX5122

## 2020-04-08 LAB — COMPREHENSIVE METABOLIC PANEL
ALT: 5 U/L (ref 0–44)
AST: 10 U/L — ABNORMAL LOW (ref 15–41)
Albumin: 2.7 g/dL — ABNORMAL LOW (ref 3.5–5.0)
Alkaline Phosphatase: 54 U/L (ref 38–126)
Anion gap: 5 (ref 5–15)
BUN: 11 mg/dL (ref 8–23)
CO2: 24 mmol/L (ref 22–32)
Calcium: 8.2 mg/dL — ABNORMAL LOW (ref 8.9–10.3)
Chloride: 106 mmol/L (ref 98–111)
Creatinine, Ser: 1.23 mg/dL — ABNORMAL HIGH (ref 0.44–1.00)
GFR calc Af Amer: 50 mL/min — ABNORMAL LOW (ref >60)
GFR calc non Af Amer: 43 mL/min — ABNORMAL LOW (ref >60)
Glucose, Bld: 117 mg/dL — ABNORMAL HIGH (ref 70–99)
Potassium: 3 mmol/L — ABNORMAL LOW (ref 3.5–5.1)
Sodium: 135 mmol/L (ref 135–145)
Total Bilirubin: 0.5 mg/dL (ref 0.3–1.2)
Total Protein: 5.8 g/dL — ABNORMAL LOW (ref 6.5–8.1)

## 2020-04-08 LAB — PREPARE RBC (CROSSMATCH)

## 2020-04-08 SURGERY — COLECTOMY, PARTIAL
Anesthesia: General

## 2020-04-08 MED ORDER — SUGAMMADEX SODIUM 200 MG/2ML IV SOLN
INTRAVENOUS | Status: DC | PRN
  Administered 2020-04-08: 200 mg via INTRAVENOUS

## 2020-04-08 MED ORDER — ENOXAPARIN SODIUM 40 MG/0.4ML ~~LOC~~ SOLN
40.0000 mg | SUBCUTANEOUS | Status: DC
  Administered 2020-04-09: 40 mg via SUBCUTANEOUS
  Filled 2020-04-08: qty 0.4

## 2020-04-08 MED ORDER — LACTATED RINGERS IV SOLN: INTRAVENOUS | Status: DC

## 2020-04-08 MED ORDER — FENTANYL CITRATE (PF) 100 MCG/2ML IJ SOLN
INTRAMUSCULAR | Status: DC | PRN
  Administered 2020-04-08 (×3): 50 ug via INTRAVENOUS
  Administered 2020-04-08: 100 ug via INTRAVENOUS
  Administered 2020-04-08: 50 ug via INTRAVENOUS

## 2020-04-08 MED ORDER — MORPHINE SULFATE (PF) 2 MG/ML IV SOLN: 2.0000 mg | INTRAVENOUS | Status: DC | PRN

## 2020-04-08 MED ORDER — 0.9 % SODIUM CHLORIDE (POUR BTL) OPTIME
TOPICAL | Status: DC | PRN
  Administered 2020-04-08 (×3): 1000 mL

## 2020-04-08 MED ORDER — LIDOCAINE 2% (20 MG/ML) 5 ML SYRINGE
INTRAMUSCULAR | Status: AC
  Filled 2020-04-08: qty 5

## 2020-04-08 MED ORDER — OXYCODONE HCL 5 MG PO TABS
5.0000 mg | ORAL_TABLET | ORAL | Status: DC | PRN
  Filled 2020-04-08: qty 1

## 2020-04-08 MED ORDER — FENTANYL CITRATE (PF) 100 MCG/2ML IJ SOLN
INTRAMUSCULAR | Status: AC
  Filled 2020-04-08: qty 2

## 2020-04-08 MED ORDER — DOCUSATE SODIUM 100 MG PO CAPS
100.0000 mg | ORAL_CAPSULE | Freq: Two times a day (BID) | ORAL | Status: DC
  Administered 2020-04-08 – 2020-04-12 (×9): 100 mg via ORAL
  Filled 2020-04-08 (×9): qty 1

## 2020-04-08 MED ORDER — GABAPENTIN 300 MG PO CAPS
300.0000 mg | ORAL_CAPSULE | Freq: Two times a day (BID) | ORAL | Status: DC
  Administered 2020-04-08 – 2020-04-12 (×9): 300 mg via ORAL
  Filled 2020-04-08 (×9): qty 1

## 2020-04-08 MED ORDER — PROPOFOL 10 MG/ML IV BOLUS
INTRAVENOUS | Status: AC
  Filled 2020-04-08: qty 20

## 2020-04-08 MED ORDER — SIMETHICONE 80 MG PO CHEW
40.0000 mg | CHEWABLE_TABLET | Freq: Four times a day (QID) | ORAL | Status: DC | PRN
  Administered 2020-04-08: 22:00:00 40 mg via ORAL
  Filled 2020-04-08: qty 1

## 2020-04-08 MED ORDER — LACTATED RINGERS IV SOLN
INTRAVENOUS | Status: DC | PRN
Start: 2020-04-08 — End: 2020-04-08

## 2020-04-08 MED ORDER — PROPOFOL 10 MG/ML IV BOLUS
INTRAVENOUS | Status: DC | PRN
  Administered 2020-04-08: 110 mg via INTRAVENOUS

## 2020-04-08 MED ORDER — LACTATED RINGERS IV SOLN: INTRAVENOUS | Status: DC | PRN

## 2020-04-08 MED ORDER — ROCURONIUM BROMIDE 10 MG/ML (PF) SYRINGE
PREFILLED_SYRINGE | INTRAVENOUS | Status: AC
  Filled 2020-04-08: qty 10

## 2020-04-08 MED ORDER — ONDANSETRON HCL 4 MG/2ML IJ SOLN: 4.0000 mg | Freq: Once | INTRAMUSCULAR | Status: DC | PRN

## 2020-04-08 MED ORDER — ROCURONIUM 10MG/ML (10ML) SYRINGE FOR MEDFUSION PUMP - OPTIME
INTRAVENOUS | Status: DC | PRN
  Administered 2020-04-08: 10 mg via INTRAVENOUS
  Administered 2020-04-08: 40 mg via INTRAVENOUS

## 2020-04-08 MED ORDER — SODIUM CHLORIDE 0.9 % IV SOLN
2.0000 g | Freq: Two times a day (BID) | INTRAVENOUS | Status: AC
  Administered 2020-04-08 – 2020-04-09 (×3): 2 g via INTRAVENOUS
  Filled 2020-04-08 (×3): qty 2

## 2020-04-08 MED ORDER — LIDOCAINE HCL (CARDIAC) PF 50 MG/5ML IV SOSY
PREFILLED_SYRINGE | INTRAVENOUS | Status: DC | PRN
  Administered 2020-04-08: 60 mg via INTRAVENOUS

## 2020-04-08 MED ORDER — DIPHENHYDRAMINE HCL 12.5 MG/5ML PO ELIX: 12.5000 mg | ORAL_SOLUTION | Freq: Four times a day (QID) | ORAL | Status: DC | PRN

## 2020-04-08 MED ORDER — ONDANSETRON HCL 4 MG/2ML IJ SOLN
INTRAMUSCULAR | Status: AC
  Filled 2020-04-08: qty 2

## 2020-04-08 MED ORDER — BUPIVACAINE LIPOSOME 1.3 % IJ SUSP
INTRAMUSCULAR | Status: DC | PRN
  Administered 2020-04-08: 20 mL

## 2020-04-08 MED ORDER — BUPIVACAINE LIPOSOME 1.3 % IJ SUSP
INTRAMUSCULAR | Status: AC
  Filled 2020-04-08: qty 10

## 2020-04-08 MED ORDER — DIPHENHYDRAMINE HCL 50 MG/ML IJ SOLN: 12.5000 mg | Freq: Four times a day (QID) | INTRAMUSCULAR | Status: DC | PRN

## 2020-04-08 MED ORDER — FENTANYL CITRATE (PF) 250 MCG/5ML IJ SOLN
INTRAMUSCULAR | Status: AC
  Filled 2020-04-08: qty 5

## 2020-04-08 MED ORDER — MIDAZOLAM HCL 5 MG/5ML IJ SOLN
INTRAMUSCULAR | Status: DC | PRN
  Administered 2020-04-08 (×2): 1 mg via INTRAVENOUS

## 2020-04-08 MED ORDER — ONDANSETRON HCL 4 MG/2ML IJ SOLN
INTRAMUSCULAR | Status: DC | PRN
  Administered 2020-04-08: 4 mg via INTRAVENOUS

## 2020-04-08 MED ORDER — ACETAMINOPHEN 500 MG PO TABS
1000.0000 mg | ORAL_TABLET | Freq: Four times a day (QID) | ORAL | Status: DC
  Administered 2020-04-08 – 2020-04-12 (×14): 1000 mg via ORAL
  Filled 2020-04-08 (×16): qty 2

## 2020-04-08 MED ORDER — MIDAZOLAM HCL 2 MG/2ML IJ SOLN
INTRAMUSCULAR | Status: AC
  Filled 2020-04-08: qty 2

## 2020-04-08 MED ORDER — STERILE WATER FOR IRRIGATION IR SOLN
Status: DC | PRN
  Administered 2020-04-08: 3000 mL

## 2020-04-08 MED ORDER — HYDROMORPHONE HCL 1 MG/ML IJ SOLN
0.2500 mg | INTRAMUSCULAR | Status: DC | PRN
  Administered 2020-04-08: 0.5 mg via INTRAVENOUS
  Filled 2020-04-08: qty 0.5

## 2020-04-08 SURGICAL SUPPLY — 62 items
BAG DRAIN URO TABLE W/ADPT NS (BAG) ×3 IMPLANT
BAG DRN 8 ADPR NS SKTRN CSTL (BAG) ×1
CATH FOLEY 3WAY 30CC 22F (CATHETERS) ×2 IMPLANT
CLOTH BEACON ORANGE TIMEOUT ST (SAFETY) ×3 IMPLANT
COVER LIGHT HANDLE STERIS (MISCELLANEOUS) ×10 IMPLANT
COVER WAND RF STERILE (DRAPES) ×3 IMPLANT
DRAPE HALF SHEET 40X57 (DRAPES) ×2 IMPLANT
DRSG OPSITE POSTOP 4X8 (GAUZE/BANDAGES/DRESSINGS) ×2 IMPLANT
ELECT BLADE 6 FLAT ULTRCLN (ELECTRODE) ×2 IMPLANT
ELECT REM PT RETURN 9FT ADLT (ELECTROSURGICAL) ×6
ELECTRODE REM PT RTRN 9FT ADLT (ELECTROSURGICAL) ×2 IMPLANT
GAUZE 4X4 16PLY RFD (DISPOSABLE) ×2 IMPLANT
GLOVE BIO SURGEON STRL SZ 6.5 (GLOVE) ×7 IMPLANT
GLOVE BIO SURGEON STRL SZ7 (GLOVE) ×2 IMPLANT
GLOVE BIO SURGEON STRL SZ8 (GLOVE) ×3 IMPLANT
GLOVE BIO SURGEONS STRL SZ 6.5 (GLOVE) ×5
GLOVE BIOGEL PI IND STRL 6.5 (GLOVE) ×2 IMPLANT
GLOVE BIOGEL PI IND STRL 7.0 (GLOVE) ×8 IMPLANT
GLOVE BIOGEL PI INDICATOR 6.5 (GLOVE) ×10
GLOVE BIOGEL PI INDICATOR 7.0 (GLOVE) ×8
GLOVE SURG SS PI 6.5 STRL IVOR (GLOVE) ×4 IMPLANT
GLOVE SURG SS PI 7.5 STRL IVOR (GLOVE) ×4 IMPLANT
GOWN STRL REUS W/ TWL LRG LVL3 (GOWN DISPOSABLE) IMPLANT
GOWN STRL REUS W/TWL LRG LVL3 (GOWN DISPOSABLE) ×31 IMPLANT
GOWN STRL REUS W/TWL XL LVL3 (GOWN DISPOSABLE) ×3 IMPLANT
INST SET MAJOR GENERAL (KITS) ×3 IMPLANT
KIT BLADEGUARD II DBL (SET/KITS/TRAYS/PACK) ×2 IMPLANT
KIT TURNOVER CYSTO (KITS) ×3 IMPLANT
KIT TURNOVER KIT A (KITS) ×3 IMPLANT
LIGASURE IMPACT 36 18CM CVD LR (INSTRUMENTS) ×3 IMPLANT
MANIFOLD NEPTUNE II (INSTRUMENTS) ×8 IMPLANT
NDL HYPO 18GX1.5 BLUNT FILL (NEEDLE) ×2 IMPLANT
NDL HYPO 21X1.5 SAFETY (NEEDLE) ×1 IMPLANT
NEEDLE HYPO 18GX1.5 BLUNT FILL (NEEDLE) ×3 IMPLANT
NEEDLE HYPO 21X1.5 SAFETY (NEEDLE) ×3 IMPLANT
NEEDLE HYPO 22GX1.5 SAFETY (NEEDLE) ×2 IMPLANT
NS IRRIG 1000ML POUR BTL (IV SOLUTION) ×8 IMPLANT
PAD ARMBOARD 7.5X6 YLW CONV (MISCELLANEOUS) ×6 IMPLANT
PAD TELFA 3X4 1S STER (GAUZE/BANDAGES/DRESSINGS) ×3 IMPLANT
PENCIL SMOKE EVACUATOR COATED (MISCELLANEOUS) ×3 IMPLANT
PLUG CATH AND CAP STER (CATHETERS) ×2 IMPLANT
RELOAD PROXIMATE 75MM BLUE (ENDOMECHANICALS) ×9 IMPLANT
RELOAD STAPLE 75 3.8 BLU REG (ENDOMECHANICALS) IMPLANT
RETRACTOR WND ALEXIS-O 25 LRG (MISCELLANEOUS) IMPLANT
RTRCTR WOUND ALEXIS O 25CM LRG (MISCELLANEOUS) ×3
SPONGE LAP 18X18 RF (DISPOSABLE) ×4 IMPLANT
STAPLER GUN LINEAR PROX 60 (STAPLE) ×3 IMPLANT
STAPLER PROXIMATE 75MM BLUE (STAPLE) ×2 IMPLANT
STAPLER VISISTAT (STAPLE) ×3 IMPLANT
SUT PDS AB CT VIOLET #0 27IN (SUTURE) ×6 IMPLANT
SUT SILK 3 0 SH CR/8 (SUTURE) ×3 IMPLANT
SUT VIC AB 3-0 SH 27 (SUTURE) ×3
SUT VIC AB 3-0 SH 27X BRD (SUTURE) IMPLANT
SYR 20ML LL LF (SYRINGE) ×3 IMPLANT
SYR 30ML LL (SYRINGE) ×2 IMPLANT
TOWEL OR 17X26 4PK STRL BLUE (TOWEL DISPOSABLE) ×3 IMPLANT
TRAY COLON PACK (CUSTOM PROCEDURE TRAY) ×3 IMPLANT
TRAY CYSTO PACK (CUSTOM PROCEDURE TRAY) ×3 IMPLANT
TRAY FOLEY MTR SLVR 16FR STAT (SET/KITS/TRAYS/PACK) ×3 IMPLANT
WATER STERILE IRR 3000ML UROMA (IV SOLUTION) ×2 IMPLANT
WATER STERILE IRR 500ML POUR (IV SOLUTION) ×5 IMPLANT
YANKAUER SUCT BULB TIP 10FT TU (MISCELLANEOUS) ×3 IMPLANT

## 2020-04-08 NOTE — Progress Notes (Signed)
Returned from surgery around 1430.  Alert and oriented and dressing to abd dry and intact with abd binder in place.  Received scheduled tylenol and has denied any pain or nausea.  Catheter has put out 100 mls of pink urine since surgery.

## 2020-04-08 NOTE — Transfer of Care (Signed)
Immediate Anesthesia Transfer of Care Note  Patient: Gabrielle [name]  Procedure(s) Performed: PARTIAL COLECTOMY (N/A ) CYSTOSCOPY WITH BIOPSY (N/A )  Patient Location: PACU  Anesthesia Type:General  Level of Consciousness: awake  Airway & Oxygen Therapy: Patient Spontanous Breathing  Post-op Assessment: Report given to RN  Post vital signs: Reviewed and stable  Last Vitals:  Vitals Value Taken Time  BP    Temp    Pulse    Resp    SpO2      Last Pain:  Vitals:   04/08/20 0832  TempSrc: Oral  PainSc: 0-No pain      Patients Stated Pain Goal: 7 (75/73/22 5672)  Complications: No apparent anesthesia complications

## 2020-04-08 NOTE — Anesthesia Procedure Notes (Signed)
Procedure Name: Intubation Date/Time: 04/08/2020 10:37 AM Performed by: Ollen Bowl, CRNA Pre-anesthesia Checklist: Patient identified, Patient being monitored, Timeout performed, Emergency Drugs available and Suction available Patient Re-evaluated:Patient Re-evaluated prior to induction Oxygen Delivery Method: Circle system utilized Preoxygenation: Pre-oxygenation with 100% oxygen Induction Type: IV induction Ventilation: Mask ventilation without difficulty Laryngoscope Size: Mac and 3 Grade View: Grade I Tube type: Oral Tube size: 7.0 mm Number of attempts: 1 Airway Equipment and Method: Stylet Placement Confirmation: ETT inserted through vocal cords under direct vision,  positive ETCO2 and breath sounds checked- equal and bilateral Secured at: 21 cm Tube secured with: Tape Dental Injury: Teeth and Oropharynx as per pre-operative assessment

## 2020-04-08 NOTE — Progress Notes (Signed)
Patient states she is doing well after surgery.  She states she took Tylenol earlier this afternoon.  She says pain is mild. Op findings noted. CEA 12.2. H. Pylori serology is positive.  Patient will be treated at a later date.

## 2020-04-08 NOTE — Progress Notes (Signed)
PROGRESS NOTE  Gabrielle [name]:[id number] DOB: December 12, 1945 DOA: 04/04/2020 PCP: Janora Norlander, DO  Brief History:  74 y.o. female with medical history significant for hypertension, CKD 3.  Patient presented to the ED with complaints of generalized weakness over the past 1 to 2 weeks.  She was evaluated by her primary care provider and her hemoglobin was low.  Patient has problems with her vision, so she is not sure if her stools have been black, but her daughter present at bedside confirms patient has been having black stools.  She also reports black stools and overall symptoms started when she started taking her iron pills.  Reports some mild right lower abdominal pain, with intermittent dysuria over the past week.  No vomiting.    She underwent colonoscopy on 04/05/2020 which revealed malignant tumor involving ascending colon and endoscopically appeared to be adenocarcinoma of the colon.  General surgery was consulted.  The patient underwent right hemicolectomy on 04/08/2020.  CT also shows severe bladder wall thickening.  The patient did develop hematuria.  Urology was consulted.  The patient underwent cystoscopy on 04/08/2020.  Assessment/Plan: Adenocarcinoma of the colon -Right hemicolectomy on 04/08/2020 -Postoperative management per Dr. Blake Divine  Symptomatic anemia -Transfused 2 units PRBC during this admission -Colonoscopy as discussed above  Iron deficiency anemia -Ferrous sulfate started  Acute urinary retention/bladder wall thickening/hematuria -04/08/20--cystoscopy and bladder bx with fulgeration-->Diffuse erythema and nodularity worse at the dome -follow up biopsies  Pyuria -04/04/20 UA >50 WBC -cultures neg -d/c ceftriaxone  Hypokalemia -replete -check mag  Hyperlipidemia -Restart statin once able to tolerate p.o.  CKD stage IIIb -Baseline creatinine 1.1-1.5 -A.m. BMP  Essential hypertension -Holding losartan/HCTZ -BP remains  controlled    Disposition Plan: Patient From: Home D/C Place: Home v SNF - 2-3  Days Barriers: Not Clinically Stable--awaiting return of bowel function and diet tolerance  Family Communication: No  Family at bedside  Consultants:  General surgery/ urology  Code Status:  FULL  DVT Prophylaxis:  SCDs   Procedures: As Listed in Progress Note Above  Antibiotics: None     Subjective: Patient denies fevers, chills, headache, chest pain, dyspnea, nausea, vomiting, diarrhea, abdominal pain, dysuria,    Objective: Vitals:   04/08/20 1330 04/08/20 1337 04/08/20 1345 04/08/20 1438  BP: (!) 118/47  (!) 125/46 (!) 109/97  Pulse: 65 69 62 63  Resp: 14 12 12 17   Temp:    97.6 F (36.4 C)  TempSrc:      SpO2: 100% 97% 98% 100%  Weight:      Height:        Intake/Output Summary (Last 24 hours) at 04/08/2020 1837 Last data filed at 04/08/2020 1700 Gross per 24 hour  Intake 2328.44 ml  Output 700 ml  Net 1628.44 ml   Weight change:  Exam:   General:  Pt is alert, follows commands appropriately, not in acute distress  HEENT: No icterus, No thrush, No neck mass, [place]/AT  Cardiovascular: RRR, S1/S2, no rubs, no gallops  Respiratory: bibasilar crackles. No wheeze  Abdomen: Soft/ decreased BS,  non distended, no guarding  Extremities: trace LE edema, No lymphangitis, No petechiae, No rashes, no synovitis   Data Reviewed: I have personally reviewed following labs and imaging studies Basic Metabolic Panel: Recent Labs  Lab 04/04/20 1519 04/05/20 0618 04/06/20 0522 04/07/20 0458 04/08/20 0433  NA 134* 137 139 137 135  K 3.0* 3.7 3.4* 3.5 3.0*  CL 99  103 105 106 106  CO2 26 26 26 23 24   GLUCOSE 128* 107* 112* 104* 117*  BUN 36* 35* 24* 13 11  CREATININE 1.59* 1.51* 1.35* 1.19* 1.23*  CALCIUM 8.7* 8.5* 8.5* 8.2* 8.2*  MG 2.4  --   --  2.0  --    Liver Function Tests: Recent Labs  Lab 04/04/20 1519 04/07/20 0458 04/08/20 0433  AST 11* 7* 10*  ALT 6 5 5    ALKPHOS 62 56 54  BILITOT 0.5 0.6 0.5  PROT 7.0 5.6* 5.8*  ALBUMIN 3.4* 2.7* 2.7*   No results for input(s): LIPASE, AMYLASE in the last 168 hours. No results for input(s): AMMONIA in the last 168 hours. Coagulation Profile: Recent Labs  Lab 04/04/20 1519  INR 1.0   CBC: Recent Labs  Lab 04/04/20 1519 04/05/20 0618 04/06/20 0522 04/07/20 0458  WBC 7.8 6.4 8.5 7.9  NEUTROABS 6.0  --   --  5.7  HGB 5.7* 7.1* 9.5* 9.5*  HCT 20.1* 24.3* 30.7* 31.4*  MCV 85.5 85.9 85.5 87.5  PLT 257 232 241 245   Cardiac Enzymes: No results for input(s): CKTOTAL, CKMB, CKMBINDEX, TROPONINI in the last 168 hours. BNP: Invalid input(s): POCBNP CBG: Recent Labs  Lab 04/05/20 2113 04/06/20 1104 04/06/20 1626  GLUCAP 85 85 90   HbA1C: No results for input(s): HGBA1C in the last 72 hours. Urine analysis:    Component Value Date/Time   COLORURINE YELLOW 04/04/2020 1758   APPEARANCEUR CLEAR 04/04/2020 1758   LABSPEC 1.008 04/04/2020 1758   PHURINE 8.0 04/04/2020 1758   GLUCOSEU NEGATIVE 04/04/2020 1758   HGBUR NEGATIVE 04/04/2020 1758   BILIRUBINUR NEGATIVE 04/04/2020 1758   KETONESUR NEGATIVE 04/04/2020 1758   PROTEINUR NEGATIVE 04/04/2020 1758   NITRITE NEGATIVE 04/04/2020 1758   LEUKOCYTESUR LARGE (A) 04/04/2020 1758   Sepsis Labs: @LABRCNTIP (procalcitonin:4,lacticidven:4) ) Recent Results (from the past 240 hour(s))  Respiratory Panel by RT PCR (Flu A&B, Covid) - Nasopharyngeal Swab     Status: None   Collection Time: 04/04/20  3:37 PM   Specimen: Nasopharyngeal Swab  Result Value Ref Range Status   SARS Coronavirus 2 by RT PCR NEGATIVE NEGATIVE Final    Comment: (NOTE) SARS-CoV-2 target nucleic acids are NOT DETECTED. The SARS-CoV-2 RNA is generally detectable in upper respiratoy specimens during the acute phase of infection. The lowest concentration of SARS-CoV-2 viral copies this assay can detect is 131 copies/mL. A negative result does not preclude  SARS-Cov-2 infection and should not be used as the sole basis for treatment or other patient management decisions. A negative result may occur with  improper specimen collection/handling, submission of specimen other than nasopharyngeal swab, presence of viral mutation(s) within the areas targeted by this assay, and inadequate number of viral copies (<131 copies/mL). A negative result must be combined with clinical observations, patient history, and epidemiological information. The expected result is Negative. Fact Sheet for Patients:  PinkCheek.be Fact Sheet for Healthcare Providers:  GravelBags.it This test is not yet ap proved or cleared by the [place] FDA and  has been authorized for detection and/or diagnosis of SARS-CoV-2 by FDA under an Emergency Use Authorization (EUA). This EUA will remain  in effect (meaning this test can be used) for the duration of the COVID-19 declaration under Section 564(b)(1) of the Act, 21 U.S.C. section 360bbb-3(b)(1), unless the authorization is terminated or revoked sooner.    Influenza A by PCR NEGATIVE NEGATIVE Final   Influenza B by PCR NEGATIVE NEGATIVE Final  Comment: (NOTE) The Xpert Xpress SARS-CoV-2/FLU/RSV assay is intended as an aid in  the diagnosis of influenza from Nasopharyngeal swab specimens and  should not be used as a sole basis for treatment. Nasal washings and  aspirates are unacceptable for Xpert Xpress SARS-CoV-2/FLU/RSV  testing. Fact Sheet for Patients: PinkCheek.be Fact Sheet for Healthcare Providers: GravelBags.it This test is not yet approved or cleared by the [place] FDA and  has been authorized for detection and/or diagnosis of SARS-CoV-2 by  FDA under an Emergency Use Authorization (EUA). This EUA will remain  in effect (meaning this test can be used) for the duration of the  Covid-19  declaration under Section 564(b)(1) of the Act, 21  U.S.C. section 360bbb-3(b)(1), unless the authorization is  terminated or revoked. Performed at [facility], [address], [place], [place] [address]   Urine culture     Status: None   Collection Time: 04/04/20  5:59 PM   Specimen: Urine, Clean Catch  Result Value Ref Range Status   Specimen Description   Final    URINE, CLEAN CATCH Performed at [facility], [address], [place], [place] [address]    Special Requests   Final    NONE Performed at [facility], [address] [place], [place] [address]    Culture   Final    NO GROWTH Performed at [facility], [place] [address] [place], [place] [address]    Report Status 04/06/2020 FINAL  Final  Surgical pcr screen     Status: None   Collection Time: 04/07/20  7:59 PM   Specimen: Nasal Mucosa; Nasal Swab  Result Value Ref Range Status   MRSA, PCR NEGATIVE NEGATIVE Final   Staphylococcus aureus NEGATIVE NEGATIVE Final    Comment: (NOTE) The Xpert SA Assay (FDA approved for NASAL specimens in patients 8 years of age and older), is one component of a comprehensive surveillance program. It is not intended to diagnose infection nor to guide or monitor treatment. Performed at [facility], [address] [place], [place] [address]      Scheduled Meds: . acetaminophen  1,000 mg Oral Q6H  . atorvastatin  20 mg Oral Daily  . Chlorhexidine Gluconate Cloth  6 each Topical Daily  . docusate sodium  100 mg Oral BID  . dorzolamide-timolol  1 drop Both Eyes Daily  . [START ON 04/09/2020] enoxaparin (LOVENOX) injection  40 mg Subcutaneous Q24H  . ferrous sulfate  325 mg Oral BID WC  . gabapentin  300 mg Oral BID  . pantoprazole (PROTONIX) IV  40 mg Intravenous Q24H  . sodium chloride flush  3 mL Intravenous Q12H   Continuous Infusions: . sodium chloride    . cefoTEtan (CEFOTAN) IV    . lactated ringers 50 mL/hr at 04/08/20 1425    Procedures/Studies: CT ABDOMEN PELVIS W  CONTRAST  Result Date: 04/06/2020 CLINICAL DATA:  Ascending colon mass seen on colonoscopy today. EXAM: CT ABDOMEN AND PELVIS WITH CONTRAST TECHNIQUE: Multidetector CT imaging of the abdomen and pelvis was performed using the standard protocol following bolus administration of intravenous contrast. CONTRAST:  74mL OMNIPAQUE IOHEXOL 300 MG/ML  SOLN COMPARISON:  None. FINDINGS: Lower chest: The lung bases are clear of an acute process. No infiltrates or effusions. No worrisome pulmonary nodules. The heart is within normal limits in size for age. No pericardial effusion. Mitral valve annular calcifications are noted. Moderate aortic calcifications. The distal esophagus is grossly normal Hepatobiliary: No hepatic lesions are identified to suggest hepatic  metastatic disease. The gallbladder is normal. No intra or extrahepatic biliary dilatation. Pancreas: No mass, inflammation or ductal dilatation. Spleen: Normal size. No focal lesions. Adrenals/Urinary Tract: The adrenal glands and kidneys are unremarkable. No worrisome renal lesions or hydronephrosis. Small renal cysts are noted. Severely thick-walled bladder which contains a Foley catheter. Findings could be due to severe chronic cystitis. I do not see a discrete mass but could not exclude the possibility of infiltrating tumor. Patient may need cystoscopic evaluation unless this is a known entity. Stomach/Bowel: The stomach, duodenum and small bowel are unremarkable. No acute inflammatory changes, mass lesions or obstructive findings. The terminal ileum is normal. The appendix is normal. Fairly extensive circumferential ascending colon mass extending from the ileocecal valve to the hepatic flexure and measuring approximately 6 cm in length. No obstruction. Contrast moves through this area and to the distal colon. There are moderate surrounding interstitial changes and could not exclude direct invasion of the pericolonic fat. There also numerous borderline enlarged  pericolonic lymph nodes which are somewhat worrisome for tumor involvement. Vascular/Lymphatic: Advanced atherosclerotic calcifications involving the aorta and iliac arteries. No enlarged retroperitoneal lymph nodes or upper abdominal lymph nodes. Reproductive: The uterus and ovaries are unremarkable. Other: Small amount of free pelvic fluid is noted. No inguinal mass or adenopathy. Musculoskeletal: No significant bony findings. IMPRESSION: 1. 6 cm circumferential ascending colon mass extending from the ileocecal valve to the hepatic flexure and possibly invading the pericolonic fat. Numerous borderline enlarged pericolonic lymph nodes are somewhat worrisome. 2. No findings for hepatic metastatic disease. 3. Severely thick-walled bladder which contains a Foley catheter. Findings could be due to severe chronic cystitis. Underlying infiltrating tumor is possible. Recommend cystoscopic evaluation unless this is a known entity. 4. Advanced atherosclerotic calcifications involving the aorta and iliac arteries. Aortic Atherosclerosis (ICD10-I70.0). Electronically Signed   By: Marijo Sanes M.D.   On: 04/06/2020 11:56   ECHOCARDIOGRAM COMPLETE  Result Date: 04/07/2020    ECHOCARDIOGRAM REPORT   Patient Name:   Gabrielle [name] Date of Exam: 04/07/2020 Medical Rec #:  [id number]       Height:       62.0 in Accession #:    [id number]      Weight:       150.0 lb Date of Birth:  1946-07-25      BSA:          1.692 m Patient Age:    81 years        BP:           123/66 mmHg Patient Gender: F               HR:           72 bpm. Exam Location:  [facility] Procedure: 2D Echo Indications:    Preoperative evaluation  History:        Patient has no prior history of Echocardiogram examinations.                 Risk Factors:Hypertension, Dyslipidemia and Non-Smoker. Stage 3                 chronic kidney disease, Macular degeneration.  Sonographer:    Leavy Cella RDCS (AE) Referring Phys: Lead  1.  Left ventricular ejection fraction, by estimation, is 65 to 70%. The left ventricle has normal function. The left ventricle has no regional wall motion abnormalities. There is mild left ventricular hypertrophy. Left ventricular diastolic parameters are consistent  with Grade I diastolic dysfunction (impaired relaxation).  2. Right ventricular systolic function is normal. The right ventricular size is normal. Tricuspid regurgitation signal is inadequate for assessing PA pressure.  3. Left atrial size was moderately dilated.  4. The mitral valve is grossly normal. Trivial mitral valve regurgitation.  5. The aortic valve is tricuspid. Aortic valve regurgitation is trivial. Mild aortic valve sclerosis is present, with no evidence of aortic valve stenosis.  6. The inferior vena cava is normal in size with greater than 50% respiratory variability, suggesting right atrial pressure of 3 mmHg. FINDINGS  Left Ventricle: Left ventricular ejection fraction, by estimation, is 65 to 70%. The left ventricle has normal function. The left ventricle has no regional wall motion abnormalities. The left ventricular internal cavity size was normal in size. There is  mild left ventricular hypertrophy. Left ventricular diastolic parameters are consistent with Grade I diastolic dysfunction (impaired relaxation). Right Ventricle: The right ventricular size is normal. No increase in right ventricular wall thickness. Right ventricular systolic function is normal. Tricuspid regurgitation signal is inadequate for assessing PA pressure. Left Atrium: Left atrial size was moderately dilated. Right Atrium: Right atrial size was normal in size. Pericardium: There is no evidence of pericardial effusion. Presence of pericardial fat pad. Mitral Valve: The mitral valve is grossly normal. Mild mitral annular calcification. Trivial mitral valve regurgitation. Tricuspid Valve: The tricuspid valve is grossly normal. Tricuspid valve regurgitation is trivial.  Aortic Valve: The aortic valve is tricuspid. Aortic valve regurgitation is trivial. Mild aortic valve sclerosis is present, with no evidence of aortic valve stenosis. Mild to moderate aortic valve annular calcification. Pulmonic Valve: The pulmonic valve was grossly normal. Pulmonic valve regurgitation is trivial. Aorta: The aortic root is normal in size and structure. Venous: The inferior vena cava is normal in size with greater than 50% respiratory variability, suggesting right atrial pressure of 3 mmHg. IAS/Shunts: No atrial level shunt detected by color flow Doppler. Rozann Lesches MD Electronically signed by Rozann Lesches MD Signature Date/Time: 04/07/2020/10:04:33 AM    Final     Orson Eva, DO  [facility]  If 7PM-7AM, please contact night-coverage [email or web address] Password [facility] 04/08/2020, 6:37 PM   LOS: 3 days

## 2020-04-08 NOTE — Op Note (Signed)
[facility] Operative Note  04/08/20  Preoperative Diagnosis:  Ascending colon cancer    Postoperative Diagnosis: Same   Procedure(s) Performed:  Right hemicolectomy    Surgeon: Ria Comment C. Constance Haw, MD   Assistants: Aviva Signs, MD    Anesthesia: General endotracheal   Anesthesiologist: Louann Sjogren, MD    Specimens:  Right colon   Estimated Blood Loss: Minimal   Blood Replacement: None    Complications: None    Wound Class: Clean contaminated    Operative Indications:  Ms. Girvan is a 74 yo who a history of anemia and findings of a ascending colon cancer on colonoscopy. She also has concern for possible bladder mass. We discussed the risk of partial colectomy including bleeding, infection, anastomotic leak, plan for bowel preparation, need for further surgery, and she opted to proceed. She also will be undergoing a cystoscopy with Dr. Alyson Ingles (see separate documentation).  The patient did do her antibiotic preparation and took her dulcolax but refused the miralax and I was not made aware of this prior to the procedure. The patient had fortunately undergone a bowel preparation for her colonoscopy on Monday, and has only had liquids since that time.    Findings: Hardened ascending colon mass with extensive into the lateral/ posterior peritoneal lining    Procedure: The patient was taken to the operating room and placed supine.  General endotracheal anesthesia was induced. Intravenous antibiotics were administered per protocol.  Dr. Alyson Ingles performed his portion of the procedure.  An orogastric tube positioned to decompress the stomach. The abdomen was prepared and draped in the usual sterile fashion.   An upper midline incision was made and carried down through to the fascia with electrocautery. The peritoneum was entered with care, and the incision was extended superior and inferior.  A wound protector was placed. The small bowel was packed into the left  upper quadrant with laparotomy pads. The liver was palpated and there was no evidence of disease. The bladder was thickened, hardened and enlarged. The ascending colon had a hard mass and extension into the lateral/ posterior peritoneal lining/ side wall.    The terminal ileum was identified and a linear cutting 75 mm stapler was used to transect proximally. A Ligasure was used to divide the mesentery down at ileocolic base.  The mesentery was scored down to the pelvic brim and around to the White line of [place] laterally.  This was extended up but the invasion in the side wall made this difficult. From here a point distal on the transverse colon just to the right of the middle colic was identified and a linear cutting 75 mm stapler was used to transect this point.  The hepatic flexure attachments were taken down in layers with the Ligasure, and the transverse and ascending colon were swept down. The duodenm was identified and protected. From here the final attachments lateral at the site of invasive were taken down, and the peritoneal lining was taken en bloc with the colon.  The right ureter was identified and protected. The colon was eviscerated and on a pedicle of mesentery. The mesentery was short but was taken as close to the origin of the right colic and ileocolic as possible.   The specimen was passed off the table.  The ends were inspected, and there was no signs of bleeding. The transverse colon did have minor dusky changes at the staple line and an additional 2cm of colon was taken with an additional linear cutting 75 mm  stapler back healthy appearing colon.    Towels were laid out. The two ends were brought into anastomotic alignment without twisting of the mesentery.  Alis clamps held the ends, and enterotomy was made in the antimesenteric side of the small bowel. A colotomy was made on the transverse colon, and a linear cutting 75 mm stapler was used to create the common channel.  There was no  bleeding from the staple line. The remaining enterotomy was closed with a TA 60 stapler.  The staple line of the TA 60 and any crossing points of the staplers were oversewn with 3-0 Silk interrupted Lembert Sutures. Two stay sutures were placed in the crotch of the common channel staple line.  The mesenteric defect was closed with a 3 -0 Vicryl running suture, and omentum was tacked to the bowel over the anastomosis for further protection. The abdomen was irrigated with warm saline.   The entire team changed gowns and gloves and new closing equipment was used. The abdomen was closed with 0 PDS running suture in the standard fashion. Exparel was injected. Staples closed the skin and a honeycomb dressing was placed.   Dr. Arnoldo Morale was assisting throughout the procedure and was present for the critical portions of the case.   Final inspection revealed acceptable hemostasis. All counts were correct at the end of the case. The patient was awakened from anesthesia and extubated without complication.  The patient went to the PACU in stable condition.   Curlene Labrum, MD [facility] [address] [place], [place] [address] [phone] (office)

## 2020-04-08 NOTE — Progress Notes (Signed)
[facility]  Patient's daughter notified surgery complete. Questions answered.   Curlene Labrum, MD [facility] [address] [place], [place] [address] [phone] (office)

## 2020-04-08 NOTE — Op Note (Signed)
Preoperative diagnosis: bladder lesion  Postoperative diagnosis: Same  Procedure: 1 cystoscopy 2. Bladder biopsy with fulgeration  Attending: Nicolette Bang  Anesthesia: General  Estimated blood loss: Minimal  Drains: 20 French foley  Specimens: Bladder biopsies x 4  Antibiotics: ancef  Findings:  Ureteral orifices in normal anatomic location. Diffuse erythema and nodularity worse at the dome  Indications: Patient is a 74 year old female with a history of gross hematuria and thick bladder wall on CT. After discussing treatment options, they decided proceed with bladder biopsy.  Procedure her in detail: The patient was brought to the operating room and a brief timeout was done to ensure correct patient, correct procedure, correct site. General anesthesia was administered patient was placed in dorsal lithotomy position. Their genitalia was then prepped and draped in usual sterile fashion. A rigid 76 French cystoscope was passed in the urethra and the bladder. Bladder was inspected and we noted diffuse erythema and nodularity on posteior wall tracking to the dome. the ureteral orifices were in the normal orthotopic locations. We proceeded to obtain multiple biopsies from the posterior wall and dome where there were areas of erythema. Hemostasis was then obtained with a bugbee. the bladder was then drained, a 20 French foley was placed and this concluded the procedure which was well tolerated by patient.  Complications: None  Condition: Stable, extubated, transferred to PACU  Plan: Patient to followup in 7 days for voiding trial and pathology discussion

## 2020-04-08 NOTE — Care Management Important Message (Signed)
Important Message  Patient Details  Name: KYIRA VOLKERT MRN: [id number] Date of Birth: 1945-12-16   Medicare Important Message Given:  Yes     Tommy Medal 04/08/2020, 3:10 PM

## 2020-04-08 NOTE — Anesthesia Postprocedure Evaluation (Signed)
Anesthesia Post Note  Patient: Gabrielle [name]  Procedure(s) Performed: PARTIAL COLECTOMY (N/A ) CYSTOSCOPY WITH BIOPSY (N/A )  Patient location during evaluation: PACU Anesthesia Type: General Level of consciousness: awake and alert and oriented Pain management: pain level controlled Vital Signs Assessment: post-procedure vital signs reviewed and stable Respiratory status: spontaneous breathing Cardiovascular status: blood pressure returned to baseline and stable Postop Assessment: no apparent nausea or vomiting Anesthetic complications: no     Last Vitals:  Vitals:   04/08/20 1337 04/08/20 1345  BP:  (!) 125/46  Pulse: 69 62  Resp: 12 12  Temp:    SpO2: 97% 98%    Last Pain:  Vitals:   04/08/20 1345  TempSrc:   PainSc: 3                  Temia Debroux

## 2020-04-08 NOTE — Interval H&P Note (Signed)
History and Physical Interval Note:  04/08/2020 10:14 AM  Gabrielle [name]  has presented today for surgery, with the diagnosis of colon cancer, bladder thickening.  The various methods of treatment have been discussed with the patient and family. After consideration of risks, benefits and other options for treatment, the patient has consented to  Procedure(s): PARTIAL COLECTOMY (N/A) CYSTOSCOPY WITH BIOPSY (N/A) as a surgical intervention.  The patient's history has been reviewed, patient examined, no change in status, stable for surgery.  I have reviewed the patient's chart and labs.  Questions were answered to the patient's satisfaction.    Plan for colectomy and cysto with Dr. Alyson Ingles. No questions. Blood available.  Virl Cagey

## 2020-04-08 NOTE — Anesthesia Preprocedure Evaluation (Addendum)
Anesthesia Evaluation  Patient identified by MRN, date of birth, ID band Patient awake    Reviewed: Allergy & Precautions, H&P , NPO status , Patient's Chart, lab work & pertinent test results, reviewed documented beta blocker date and time   Airway Mallampati: I  TM Distance: >3 FB Neck ROM: full  Mouth opening: Limited Mouth Opening  Dental  (+) Edentulous Upper, Edentulous Lower   Pulmonary neg pulmonary ROS,    Pulmonary exam normal breath sounds clear to auscultation       Cardiovascular Exercise Tolerance: Good hypertension, negative cardio ROS   Rhythm:regular Rate:Normal     Neuro/Psych negative neurological ROS  negative psych ROS   GI/Hepatic negative GI ROS, Neg liver ROS,   Endo/Other  negative endocrine ROS  Renal/GU negative Renal ROS  negative genitourinary   Musculoskeletal   Abdominal   Peds  Hematology  (+) Blood dyscrasia, anemia ,   Anesthesia Other Findings   Reproductive/Obstetrics negative OB ROS                            Anesthesia Physical Anesthesia Plan  ASA: II  Anesthesia Plan: General   Post-op Pain Management:    Induction:   PONV Risk Score and Plan: Ondansetron  Airway Management Planned:   Additional Equipment:   Intra-op Plan:   Post-operative Plan:   Informed Consent: I have reviewed the patients History and Physical, chart, labs and discussed the procedure including the risks, benefits and alternatives for the proposed anesthesia with the patient or authorized representative who has indicated his/her understanding and acceptance.     Dental Advisory Given  Plan Discussed with: CRNA  Anesthesia Plan Comments:        Anesthesia Quick Evaluation

## 2020-04-09 DIAGNOSIS — D649 Anemia, unspecified: Secondary | ICD-10-CM

## 2020-04-09 DIAGNOSIS — C182 Malignant neoplasm of ascending colon: Secondary | ICD-10-CM | POA: Diagnosis not present

## 2020-04-09 DIAGNOSIS — N1832 Chronic kidney disease, stage 3b: Secondary | ICD-10-CM

## 2020-04-09 DIAGNOSIS — N3289 Other specified disorders of bladder: Secondary | ICD-10-CM | POA: Diagnosis not present

## 2020-04-09 LAB — COMPREHENSIVE METABOLIC PANEL
ALT: 5 U/L (ref 0–44)
AST: 9 U/L — ABNORMAL LOW (ref 15–41)
Albumin: 2.3 g/dL — ABNORMAL LOW (ref 3.5–5.0)
Alkaline Phosphatase: 49 U/L (ref 38–126)
Anion gap: 7 (ref 5–15)
BUN: 10 mg/dL (ref 8–23)
CO2: 28 mmol/L (ref 22–32)
Calcium: 8.2 mg/dL — ABNORMAL LOW (ref 8.9–10.3)
Chloride: 103 mmol/L (ref 98–111)
Creatinine, Ser: 1.51 mg/dL — ABNORMAL HIGH (ref 0.44–1.00)
GFR calc Af Amer: 39 mL/min — ABNORMAL LOW (ref >60)
GFR calc non Af Amer: 34 mL/min — ABNORMAL LOW (ref >60)
Glucose, Bld: 96 mg/dL (ref 70–99)
Potassium: 3.5 mmol/L (ref 3.5–5.1)
Sodium: 138 mmol/L (ref 135–145)
Total Bilirubin: 0.3 mg/dL (ref 0.3–1.2)
Total Protein: 5.2 g/dL — ABNORMAL LOW (ref 6.5–8.1)

## 2020-04-09 LAB — CBC WITH DIFFERENTIAL/PLATELET
Abs Immature Granulocytes: 0.04 10*3/uL (ref 0.00–0.07)
Basophils Absolute: 0 10*3/uL (ref 0.0–0.1)
Basophils Relative: 0 %
Eosinophils Absolute: 0.7 10*3/uL — ABNORMAL HIGH (ref 0.0–0.5)
Eosinophils Relative: 7 %
HCT: 31.1 % — ABNORMAL LOW (ref 36.0–46.0)
Hemoglobin: 8.9 g/dL — ABNORMAL LOW (ref 12.0–15.0)
Immature Granulocytes: 0 %
Lymphocytes Relative: 12 %
Lymphs Abs: 1.2 10*3/uL (ref 0.7–4.0)
MCH: 26.3 pg (ref 26.0–34.0)
MCHC: 28.6 g/dL — ABNORMAL LOW (ref 30.0–36.0)
MCV: 91.7 fL (ref 80.0–100.0)
Monocytes Absolute: 0.9 10*3/uL (ref 0.1–1.0)
Monocytes Relative: 9 %
Neutro Abs: 7.1 10*3/uL (ref 1.7–7.7)
Neutrophils Relative %: 72 %
Platelets: 226 10*3/uL (ref 150–400)
RBC: 3.39 MIL/uL — ABNORMAL LOW (ref 3.87–5.11)
RDW: 17.4 % — ABNORMAL HIGH (ref 11.5–15.5)
WBC: 10 10*3/uL (ref 4.0–10.5)
nRBC: 0 % (ref 0.0–0.2)

## 2020-04-09 LAB — SURGICAL PATHOLOGY

## 2020-04-09 LAB — MAGNESIUM: Magnesium: 1.8 mg/dL (ref 1.7–2.4)

## 2020-04-09 LAB — PHOSPHORUS: Phosphorus: 4.2 mg/dL (ref 2.5–4.6)

## 2020-04-09 MED ORDER — POTASSIUM CHLORIDE 20 MEQ PO PACK
40.0000 meq | PACK | Freq: Two times a day (BID) | ORAL | Status: DC
  Administered 2020-04-09 – 2020-04-10 (×3): 40 meq via ORAL
  Filled 2020-04-09 (×3): qty 2

## 2020-04-09 MED ORDER — ALVIMOPAN 12 MG PO CAPS
12.0000 mg | ORAL_CAPSULE | Freq: Two times a day (BID) | ORAL | Status: DC
  Administered 2020-04-09 – 2020-04-11 (×4): 12 mg via ORAL
  Filled 2020-04-09 (×5): qty 1

## 2020-04-09 MED ORDER — MAGNESIUM SULFATE 2 GM/50ML IV SOLN
2.0000 g | Freq: Once | INTRAVENOUS | Status: AC
  Administered 2020-04-09: 11:00:00 2 g via INTRAVENOUS
  Filled 2020-04-09: qty 50

## 2020-04-09 MED ORDER — ENOXAPARIN SODIUM 30 MG/0.3ML ~~LOC~~ SOLN
30.0000 mg | SUBCUTANEOUS | Status: DC
  Administered 2020-04-10: 08:00:00 30 mg via SUBCUTANEOUS
  Filled 2020-04-09: qty 0.3

## 2020-04-09 NOTE — Progress Notes (Addendum)
PROGRESS NOTE  Gabrielle [name] GDJ:[id number] DOB: 1946-11-19 DOA: 04/04/2020 PCP: Janora Norlander, DO   Brief History:  74 y.o.femalewith medical history significant forhypertension, CKD 3. Patient presented to the ED with complaints of generalized weakness over the past 1 to 2 weeks.She was evaluated by her primary care provider and her hemoglobin was low. Patient has problems with her vision, soshe is not sure if her stools have been black, but her daughter present at bedside confirms patient has been having black stools.She also reports black stools and overall symptoms started when she started taking her iron pills. Reports some mild right lower abdominal pain, with intermittent dysuria over the past week. No vomiting.   She underwent colonoscopy on 04/05/2020 which revealed malignant tumor involving ascending colon and endoscopically appeared to be adenocarcinoma of the colon.  General surgery was consulted.  The patient underwent right hemicolectomy on 04/08/2020.  CT also shows severe bladder wall thickening.  The patient did develop hematuria.  Urology was consulted.  The patient underwent cystoscopy on 04/08/2020.  Assessment/Plan: Adenocarcinoma of the colon -Right hemicolectomy on 04/08/2020 -Postoperative management per Dr. Blake Divine -operative noted extension of mass into the lateral/ posterior peritoneal lining/ side wall.   -diet advancement per surgery -increase activity-->PT eval, OOB -judicious pain control  Symptomatic anemia -Transfused 2 units PRBC during this admission -Colonoscopy as discussed above  Iron deficiency anemia -Ferrous sulfate started  Acute urinary retention/bladder wall thickening/hematuria -04/08/20--cystoscopy and bladder bx with fulgeration-->Diffuse erythema and nodularity worse at the dome -follow up biopsies  Pyuria -04/04/20 UA >50 WBC -cultures neg -d/c ceftriaxone  Hypokalemia -replete -check  mag--1.9  Hyperlipidemia -Restart statin once able to tolerate p.o.  CKD stage IIIb -Baseline creatinine 1.1-1.5 -A.m. BMP  Essential hypertension -Holding losartan/HCTZ -BP remains controlled   Total time spent 35 minutes.  Greater than 50% spent face to face counseling and coordinating care.   Disposition Plan: Patient From: Home D/C Place: Home v SNF - 2-3  Days Barriers: Not Clinically Stable--awaiting return of bowel function and diet tolerance  Family Communication: Daughter and sister at bedside 5/6  Consultants:  General surgery/ urology  Code Status:  FULL  DVT Prophylaxis:  SCDs   Procedures: As Listed in Progress Note Above  Antibiotics: None   Subjective: Patient denies fevers, chills, headache, chest pain, dyspnea, nausea, vomiting, diarrhea, abdominal pain, dysuria, hematuria, hematochezia, and melena.   Objective: Vitals:   04/08/20 2003 04/08/20 2250 04/09/20 0440 04/09/20 0641  BP:  (!) 117/44 (!) 124/48 (!) 122/58  Pulse:  70 65 66  Resp:  20 20 16   Temp:  97.7 F (36.5 C) 97.7 F (36.5 C) 97.6 F (36.4 C)  TempSrc:  Oral Oral Oral  SpO2: 99% 100% 100% 100%  Weight:      Height:        Intake/Output Summary (Last 24 hours) at 04/09/2020 0845 Last data filed at 04/09/2020 0541 Gross per 24 hour  Intake 3237.58 ml  Output 700 ml  Net 2537.58 ml   Weight change:  Exam:   General:  Pt is alert, follows commands appropriately, not in acute distress  HEENT: No icterus, No thrush, No neck mass, [place]/AT  Cardiovascular: RRR, S1/S2, no rubs, no gallops  Respiratory: CTA bilaterally, no wheezing, no crackles, no rhonchi  Abdomen: Soft/+BS, incisional tender, non distended, no guarding  Extremities: No edema, No lymphangitis, No petechiae, No rashes, no synovitis   Data Reviewed: I  have personally reviewed following labs and imaging studies Basic Metabolic Panel: Recent Labs  Lab 04/04/20 1519 04/04/20 1519  04/05/20 0618 04/06/20 0522 04/07/20 0458 04/08/20 0433 04/09/20 0459  NA 134*   < > 137 139 137 135 138  K 3.0*   < > 3.7 3.4* 3.5 3.0* 3.5  CL 99   < > 103 105 106 106 103  CO2 26   < > 26 26 23 24 28   GLUCOSE 128*   < > 107* 112* 104* 117* 96  BUN 36*   < > 35* 24* 13 11 10   CREATININE 1.59*   < > 1.51* 1.35* 1.19* 1.23* 1.51*  CALCIUM 8.7*   < > 8.5* 8.5* 8.2* 8.2* 8.2*  MG 2.4  --   --   --  2.0  --  1.8  PHOS  --   --   --   --   --   --  4.2   < > = values in this interval not displayed.   Liver Function Tests: Recent Labs  Lab 04/04/20 1519 04/07/20 0458 04/08/20 0433 04/09/20 0459  AST 11* 7* 10* 9*  ALT 6 5 5 5   ALKPHOS 62 56 54 49  BILITOT 0.5 0.6 0.5 0.3  PROT 7.0 5.6* 5.8* 5.2*  ALBUMIN 3.4* 2.7* 2.7* 2.3*   No results for input(s): LIPASE, AMYLASE in the last 168 hours. No results for input(s): AMMONIA in the last 168 hours. Coagulation Profile: Recent Labs  Lab 04/04/20 1519  INR 1.0   CBC: Recent Labs  Lab 04/04/20 1519 04/05/20 0618 04/06/20 0522 04/07/20 0458 04/09/20 0459  WBC 7.8 6.4 8.5 7.9 10.0  NEUTROABS 6.0  --   --  5.7 7.1  HGB 5.7* 7.1* 9.5* 9.5* 8.9*  HCT 20.1* 24.3* 30.7* 31.4* 31.1*  MCV 85.5 85.9 85.5 87.5 91.7  PLT 257 232 241 245 226   Cardiac Enzymes: No results for input(s): CKTOTAL, CKMB, CKMBINDEX, TROPONINI in the last 168 hours. BNP: Invalid input(s): POCBNP CBG: Recent Labs  Lab 04/05/20 2113 04/06/20 1104 04/06/20 1626  GLUCAP 85 85 90   HbA1C: No results for input(s): HGBA1C in the last 72 hours. Urine analysis:    Component Value Date/Time   COLORURINE YELLOW 04/04/2020 1758   APPEARANCEUR CLEAR 04/04/2020 1758   LABSPEC 1.008 04/04/2020 1758   PHURINE 8.0 04/04/2020 1758   GLUCOSEU NEGATIVE 04/04/2020 1758   HGBUR NEGATIVE 04/04/2020 1758   BILIRUBINUR NEGATIVE 04/04/2020 1758   KETONESUR NEGATIVE 04/04/2020 1758   PROTEINUR NEGATIVE 04/04/2020 1758   NITRITE NEGATIVE 04/04/2020 1758    LEUKOCYTESUR LARGE (A) 04/04/2020 1758   Sepsis Labs: @LABRCNTIP (procalcitonin:4,lacticidven:4) ) Recent Results (from the past 240 hour(s))  Respiratory Panel by RT PCR (Flu A&B, Covid) - Nasopharyngeal Swab     Status: None   Collection Time: 04/04/20  3:37 PM   Specimen: Nasopharyngeal Swab  Result Value Ref Range Status   SARS Coronavirus 2 by RT PCR NEGATIVE NEGATIVE Final    Comment: (NOTE) SARS-CoV-2 target nucleic acids are NOT DETECTED. The SARS-CoV-2 RNA is generally detectable in upper respiratoy specimens during the acute phase of infection. The lowest concentration of SARS-CoV-2 viral copies this assay can detect is 131 copies/mL. A negative result does not preclude SARS-Cov-2 infection and should not be used as the sole basis for treatment or other patient management decisions. A negative result may occur with  improper specimen collection/handling, submission of specimen other than nasopharyngeal swab, presence of viral mutation(s) within  the areas targeted by this assay, and inadequate number of viral copies (<131 copies/mL). A negative result must be combined with clinical observations, patient history, and epidemiological information. The expected result is Negative. Fact Sheet for Patients:  PinkCheek.be Fact Sheet for Healthcare Providers:  GravelBags.it This test is not yet ap proved or cleared by the [place] FDA and  has been authorized for detection and/or diagnosis of SARS-CoV-2 by FDA under an Emergency Use Authorization (EUA). This EUA will remain  in effect (meaning this test can be used) for the duration of the COVID-19 declaration under Section 564(b)(1) of the Act, 21 U.S.C. section 360bbb-3(b)(1), unless the authorization is terminated or revoked sooner.    Influenza A by PCR NEGATIVE NEGATIVE Final   Influenza B by PCR NEGATIVE NEGATIVE Final    Comment: (NOTE) The Xpert Xpress  SARS-CoV-2/FLU/RSV assay is intended as an aid in  the diagnosis of influenza from Nasopharyngeal swab specimens and  should not be used as a sole basis for treatment. Nasal washings and  aspirates are unacceptable for Xpert Xpress SARS-CoV-2/FLU/RSV  testing. Fact Sheet for Patients: PinkCheek.be Fact Sheet for Healthcare Providers: GravelBags.it This test is not yet approved or cleared by the [place] FDA and  has been authorized for detection and/or diagnosis of SARS-CoV-2 by  FDA under an Emergency Use Authorization (EUA). This EUA will remain  in effect (meaning this test can be used) for the duration of the  Covid-19 declaration under Section 564(b)(1) of the Act, 21  U.S.C. section 360bbb-3(b)(1), unless the authorization is  terminated or revoked. Performed at [facility], [address] [place], [place] [address]   Urine culture     Status: None   Collection Time: 04/04/20  5:59 PM   Specimen: Urine, Clean Catch  Result Value Ref Range Status   Specimen Description   Final    URINE, CLEAN CATCH Performed at [facility], [address] [place], [place] [address]    Special Requests   Final    NONE Performed at [facility], [address], [place], [place] [address]    Culture   Final    NO GROWTH Performed at [facility], [place] [address] [place], [place] [address]    Report Status 04/06/2020 FINAL  Final  Surgical pcr screen     Status: None   Collection Time: 04/07/20  7:59 PM   Specimen: Nasal Mucosa; Nasal Swab  Result Value Ref Range Status   MRSA, PCR NEGATIVE NEGATIVE Final   Staphylococcus aureus NEGATIVE NEGATIVE Final    Comment: (NOTE) The Xpert SA Assay (FDA approved for NASAL specimens in patients 64 years of age and older), is one component of a comprehensive surveillance program. It is not intended to diagnose infection nor to guide or monitor treatment. Performed at [facility], [address], [place], [place] [address]      Scheduled Meds: . acetaminophen  1,000 mg Oral Q6H  . atorvastatin  20 mg Oral Daily  . Chlorhexidine Gluconate Cloth  6 each Topical Daily  . docusate sodium  100 mg Oral BID  . dorzolamide-timolol  1 drop Both Eyes Daily  . [START ON 04/10/2020] enoxaparin (LOVENOX) injection  30 mg Subcutaneous Q24H  . ferrous sulfate  325 mg Oral BID WC  . gabapentin  300 mg Oral BID  . pantoprazole (PROTONIX) IV  40 mg Intravenous Q24H  . sodium chloride flush  3 mL Intravenous Q12H   Continuous Infusions: . sodium  chloride    . cefoTEtan (CEFOTAN) IV Stopped (04/08/20 2246)  . lactated ringers 50 mL/hr at 04/09/20 0545    Procedures/Studies: CT ABDOMEN PELVIS W CONTRAST  Result Date: 04/06/2020 CLINICAL DATA:  Ascending colon mass seen on colonoscopy today. EXAM: CT ABDOMEN AND PELVIS WITH CONTRAST TECHNIQUE: Multidetector CT imaging of the abdomen and pelvis was performed using the standard protocol following bolus administration of intravenous contrast. CONTRAST:  37mL OMNIPAQUE IOHEXOL 300 MG/ML  SOLN COMPARISON:  None. FINDINGS: Lower chest: The lung bases are clear of an acute process. No infiltrates or effusions. No worrisome pulmonary nodules. The heart is within normal limits in size for age. No pericardial effusion. Mitral valve annular calcifications are noted. Moderate aortic calcifications. The distal esophagus is grossly normal Hepatobiliary: No hepatic lesions are identified to suggest hepatic metastatic disease. The gallbladder is normal. No intra or extrahepatic biliary dilatation. Pancreas: No mass, inflammation or ductal dilatation. Spleen: Normal size. No focal lesions. Adrenals/Urinary Tract: The adrenal glands and kidneys are unremarkable. No worrisome renal lesions or hydronephrosis. Small renal cysts are noted. Severely thick-walled bladder which contains a Foley catheter. Findings could be due to severe chronic cystitis. I do not  see a discrete mass but could not exclude the possibility of infiltrating tumor. Patient may need cystoscopic evaluation unless this is a known entity. Stomach/Bowel: The stomach, duodenum and small bowel are unremarkable. No acute inflammatory changes, mass lesions or obstructive findings. The terminal ileum is normal. The appendix is normal. Fairly extensive circumferential ascending colon mass extending from the ileocecal valve to the hepatic flexure and measuring approximately 6 cm in length. No obstruction. Contrast moves through this area and to the distal colon. There are moderate surrounding interstitial changes and could not exclude direct invasion of the pericolonic fat. There also numerous borderline enlarged pericolonic lymph nodes which are somewhat worrisome for tumor involvement. Vascular/Lymphatic: Advanced atherosclerotic calcifications involving the aorta and iliac arteries. No enlarged retroperitoneal lymph nodes or upper abdominal lymph nodes. Reproductive: The uterus and ovaries are unremarkable. Other: Small amount of free pelvic fluid is noted. No inguinal mass or adenopathy. Musculoskeletal: No significant bony findings. IMPRESSION: 1. 6 cm circumferential ascending colon mass extending from the ileocecal valve to the hepatic flexure and possibly invading the pericolonic fat. Numerous borderline enlarged pericolonic lymph nodes are somewhat worrisome. 2. No findings for hepatic metastatic disease. 3. Severely thick-walled bladder which contains a Foley catheter. Findings could be due to severe chronic cystitis. Underlying infiltrating tumor is possible. Recommend cystoscopic evaluation unless this is a known entity. 4. Advanced atherosclerotic calcifications involving the aorta and iliac arteries. Aortic Atherosclerosis (ICD10-I70.0). Electronically Signed   By: Marijo Sanes M.D.   On: 04/06/2020 11:56   ECHOCARDIOGRAM COMPLETE  Result Date: 04/07/2020    ECHOCARDIOGRAM REPORT   Patient  Name:   TAMIRAH GEORGE Date of Exam: 04/07/2020 Medical Rec #:  [id number]       Height:       62.0 in Accession #:    [id number]      Weight:       150.0 lb Date of Birth:  Jul 14, 1946      BSA:          1.692 m Patient Age:    79 years        BP:           123/66 mmHg Patient Gender: F               HR:  72 bpm. Exam Location:  [facility] Procedure: 2D Echo Indications:    Preoperative evaluation  History:        Patient has no prior history of Echocardiogram examinations.                 Risk Factors:Hypertension, Dyslipidemia and Non-Smoker. Stage 3                 chronic kidney disease, Macular degeneration.  Sonographer:    Leavy Cella RDCS (AE) Referring Phys: [place]  1. Left ventricular ejection fraction, by estimation, is 65 to 70%. The left ventricle has normal function. The left ventricle has no regional wall motion abnormalities. There is mild left ventricular hypertrophy. Left ventricular diastolic parameters are consistent with Grade I diastolic dysfunction (impaired relaxation).  2. Right ventricular systolic function is normal. The right ventricular size is normal. Tricuspid regurgitation signal is inadequate for assessing PA pressure.  3. Left atrial size was moderately dilated.  4. The mitral valve is grossly normal. Trivial mitral valve regurgitation.  5. The aortic valve is tricuspid. Aortic valve regurgitation is trivial. Mild aortic valve sclerosis is present, with no evidence of aortic valve stenosis.  6. The inferior vena cava is normal in size with greater than 50% respiratory variability, suggesting right atrial pressure of 3 mmHg. FINDINGS  Left Ventricle: Left ventricular ejection fraction, by estimation, is 65 to 70%. The left ventricle has normal function. The left ventricle has no regional wall motion abnormalities. The left ventricular internal cavity size was normal in size. There is  mild left ventricular hypertrophy. Left ventricular  diastolic parameters are consistent with Grade I diastolic dysfunction (impaired relaxation). Right Ventricle: The right ventricular size is normal. No increase in right ventricular wall thickness. Right ventricular systolic function is normal. Tricuspid regurgitation signal is inadequate for assessing PA pressure. Left Atrium: Left atrial size was moderately dilated. Right Atrium: Right atrial size was normal in size. Pericardium: There is no evidence of pericardial effusion. Presence of pericardial fat pad. Mitral Valve: The mitral valve is grossly normal. Mild mitral annular calcification. Trivial mitral valve regurgitation. Tricuspid Valve: The tricuspid valve is grossly normal. Tricuspid valve regurgitation is trivial. Aortic Valve: The aortic valve is tricuspid. Aortic valve regurgitation is trivial. Mild aortic valve sclerosis is present, with no evidence of aortic valve stenosis. Mild to moderate aortic valve annular calcification. Pulmonic Valve: The pulmonic valve was grossly normal. Pulmonic valve regurgitation is trivial. Aorta: The aortic root is normal in size and structure. Venous: The inferior vena cava is normal in size with greater than 50% respiratory variability, suggesting right atrial pressure of 3 mmHg. IAS/Shunts: No atrial level shunt detected by color flow Doppler. Rozann Lesches MD Electronically signed by Rozann Lesches MD Signature Date/Time: 04/07/2020/10:04:33 AM    Final     Orson Eva, DO  [facility]  If 7PM-7AM, please contact night-coverage [email or web address] Password [email or web address] 04/09/2020, 8:45 AM   LOS: 4 days

## 2020-04-09 NOTE — Consult Note (Signed)
[facility] Consultation Oncology  Name: Gabrielle [name]      MRN: [id number]    Location: A311/A311-01  Date: 04/09/2020 Time:5:54 PM   REFERRING PHYSICIAN: Dr. Carles Collet  REASON FOR CONSULT: Colon cancer   DIAGNOSIS: Colon cancer and bladder thickening  HISTORY OF PRESENT ILLNESS: Gabrielle [name] is a 74 year old very pleasant white female who is seen in consultation today for further work-up and management of newly diagnosed colon cancer.  She came into the ER with severe weakness and was found to have hemoglobin of 5.7.  She never had a colonoscopy.  She noticed dark stools but was taking oral iron therapy.  A CT scan of the abdomen and pelvis with contrast on 04/06/2020 showed 6 cm circumferential ascending colon mass extending from the ileocecal valve to the hepatic flexure and possibly invading the pericolonic fat.  Numerous borderline and enlarged pericolonic lymph nodes.  No evidence of hepatic metastatic disease.  Severely thick-walled bladder could be due to severe chronic cystitis.  Colonoscopy on 04/06/2020 showed large circumferential ulcerated mass extending from the superior aspect of the ileocecal valve to the hepatic flexure.  She was evaluated by Dr. Constance Haw and underwent right hemicolectomy on 04/08/2020.  Dr. Alyson Ingles did a cystoscopy on the same day along with biopsies.  Pathology report is pending at this time.  Patient is able to walk down the hall today.  She is feeling much better.  She worked in [employer] prior to retirement.  She is a never smoker.  She lives by herself in an apartment in [place].  Closest family member is daughter Gabrielle [name], lives in [place].  Patient has 2 sons who live in [place] and [place].  Family history significant for father with lung cancer and was a smoker.  Paternal uncle had colon cancer.  Maternal aunt had a brain tumor.  Another maternal aunt had gynecological malignancy.  She does not report any tingling or numbness in the  extremities.  She used to be active and walk every day.  PAST MEDICAL HISTORY:   Past Medical History:  Diagnosis Date  . Cataract    OU  . H/O cesarean section   . Hx of tonsillectomy   . Hyperlipidemia   . Hypertension   . Macular degeneration    Exu ARMD OU    ALLERGIES: Allergies  Allergen Reactions  . Lisinopril Cough    Pt states she is not allergic to lisinopril      MEDICATIONS: I have reviewed the patient's current medications.     PAST SURGICAL HISTORY Past Surgical History:  Procedure Laterality Date  . BIOPSY  04/06/2020   Procedure: BIOPSY;  Surgeon: Rogene Houston, MD;  Location: [facility];  Service: Endoscopy;;  . CARPAL TUNNEL RELEASE Right   . CATARACT EXTRACTION W/PHACO Left 10/11/2019   Procedure: CATARACT EXTRACTION PHACO AND INTRAOCULAR LENS PLACEMENT (IOC);  Surgeon: Baruch Goldmann, MD;  Location: [facility] ORS;  Service: Ophthalmology;  Laterality: Left;  CDE: 8.56  . CATARACT EXTRACTION W/PHACO Right 10/25/2019   Procedure: CATARACT EXTRACTION PHACO AND INTRAOCULAR LENS PLACEMENT (IOC);  Surgeon: Baruch Goldmann, MD;  Location: [facility] ORS;  Service: Ophthalmology;  Laterality: Right;  CDE: 5.67  . CESAREAN SECTION    . COLONOSCOPY N/A 04/06/2020   Procedure: COLONOSCOPY;  Surgeon: Rogene Houston, MD;  Location: [facility];  Service: Endoscopy;  Laterality: N/A;  . CYSTOSCOPY WITH BIOPSY N/A 04/08/2020   Procedure: CYSTOSCOPY WITH BIOPSY;  Surgeon: Cleon Gustin,  MD;  Location: [facility] ORS;  Service: Urology;  Laterality: N/A;  . ESOPHAGOGASTRODUODENOSCOPY N/A 04/05/2020   Procedure: ESOPHAGOGASTRODUODENOSCOPY (EGD);  Surgeon: Rogene Houston, MD;  [facility]: [facility];  Service: Endoscopy;  Laterality: N/A;  . PARTIAL COLECTOMY N/A 04/08/2020   Procedure: PARTIAL COLECTOMY;  Surgeon: Virl Cagey, MD;  Location: [facility] ORS;  Service: General;  Laterality: N/A;  . TONSILLECTOMY      FAMILY HISTORY: Family History  Problem Relation Age of Onset  .  Macular degeneration Mother   . Stroke Mother   . Hypertension Mother   . Dementia Mother   . Cancer Father        lung  . Diabetes Sister   . Hypertension Sister     SOCIAL HISTORY:  reports that she has never smoked. She has never used smokeless tobacco. She reports that she does not drink alcohol or use drugs.  PERFORMANCE STATUS: The patient's performance status is 2 - Symptomatic, <50% confined to bed  PHYSICAL EXAM: Most Recent Vital Signs: Blood pressure (!) 122/58, pulse 66, temperature 97.6 F (36.4 C), temperature source Oral, resp. rate 16, height '5\' 2"'  (1.575 m), weight 150 lb (68 kg), SpO2 100 %. BP (!) 122/58 (BP Location: Right Arm)   Pulse 66   Temp 97.6 F (36.4 C) (Oral)   Resp 16   Ht '5\' 2"'  (1.575 m)   Wt 150 lb (68 kg)   SpO2 100%   BMI 27.44 kg/m  General appearance: alert, cooperative and appears stated age Head: Normocephalic, without obvious abnormality, atraumatic Eyes: Conjunctiva nonicteric. Neck: no adenopathy and supple, symmetrical, trachea midline Lungs: Bilateral clear breath sounds present. Heart: regular rate and rhythm Abdomen: Soft.  No masses palpable. Extremities: No edema or cyanosis. Skin: No skin rashes. Lymph nodes: Cervical, supraclavicular, and axillary nodes normal. Neurologic: Grossly normal  LABORATORY DATA:  Results for orders placed or performed during the hospital encounter of 04/04/20 (from the past 48 hour(s))  Surgical pcr screen     Status: None   Collection Time: 04/07/20  7:59 PM   Specimen: Nasal Mucosa; Nasal Swab  Result Value Ref Range   MRSA, PCR NEGATIVE NEGATIVE   Staphylococcus aureus NEGATIVE NEGATIVE    Comment: (NOTE) The Xpert SA Assay (FDA approved for NASAL specimens in patients 17 years of age and older), is one component of a comprehensive surveillance program. It is not intended to diagnose infection nor to guide or monitor treatment. Performed at [facility], [address]  [place], [place] [address]   Type and screen     Status: None (Preliminary result)   Collection Time: 04/07/20 10:14 PM  Result Value Ref Range   ABO/RH(D) A NEG    Antibody Screen POS    Sample Expiration 04/10/2020,2359    Antibody Identification ANTI FYA (Duffy a)    Unit Number      [id number] Performed at [facility], [place] [address], [place], [place] [address]    Blood Component Type      RED CELLS,LR Performed at [facility] [address] [place], [place] [address]    Unit division      00 Performed at [facility], [place] [address], [place], [place] [address]    Status of Unit      ALLOCATED Performed at [facility], [address], [place], [place] [address]    Donor AG Type NEGATIVE FOR DUFFY A ANTIGEN    Transfusion Status OK  TO TRANSFUSE    Crossmatch Result COMPATIBLE    Unit Number [id number]    Blood Component Type RED CELLS,LR    Unit division 00    Status of Unit ALLOCATED    Donor AG Type NEGATIVE FOR DUFFY A ANTIGEN    Transfusion Status OK TO TRANSFUSE    Crossmatch Result COMPATIBLE   Comprehensive metabolic panel     Status: Abnormal   Collection Time: 04/08/20  4:33 AM  Result Value Ref Range   Sodium 135 135 - 145 mmol/L   Potassium 3.0 (L) 3.5 - 5.1 mmol/L   Chloride 106 98 - 111 mmol/L   CO2 24 22 - 32 mmol/L   Glucose, Bld 117 (H) 70 - 99 mg/dL    Comment: Glucose reference range applies only to samples taken after fasting for at least 8 hours.   BUN 11 8 - 23 mg/dL   Creatinine, Ser 1.23 (H) 0.44 - 1.00 mg/dL   Calcium 8.2 (L) 8.9 - 10.3 mg/dL   Total Protein 5.8 (L) 6.5 - 8.1 g/dL   Albumin 2.7 (L) 3.5 - 5.0 g/dL   AST 10 (L) 15 - 41 U/L   ALT 5 0 - 44 U/L   Alkaline Phosphatase 54 38 - 126 U/L   Total Bilirubin 0.5 0.3 - 1.2 mg/dL   GFR calc non Af Amer 43 (L) >60 mL/min   GFR calc Af Amer 50 (L) >60 mL/min   Anion gap 5 5 - 15    Comment: Performed at [facility], [address] [place], [place] [address]   Comprehensive metabolic panel     Status: Abnormal   Collection Time: 04/09/20  4:59 AM  Result Value Ref Range   Sodium 138 135 - 145 mmol/L   Potassium 3.5 3.5 - 5.1 mmol/L   Chloride 103 98 - 111 mmol/L   CO2 28 22 - 32 mmol/L   Glucose, Bld 96 70 - 99 mg/dL    Comment: Glucose reference range applies only to samples taken after fasting for at least 8 hours.   BUN 10 8 - 23 mg/dL   Creatinine, Ser 1.51 (H) 0.44 - 1.00 mg/dL   Calcium 8.2 (L) 8.9 - 10.3 mg/dL   Total Protein 5.2 (L) 6.5 - 8.1 g/dL   Albumin 2.3 (L) 3.5 - 5.0 g/dL   AST 9 (L) 15 - 41 U/L   ALT 5 0 - 44 U/L   Alkaline Phosphatase 49 38 - 126 U/L   Total Bilirubin 0.3 0.3 - 1.2 mg/dL   GFR calc non Af Amer 34 (L) >60 mL/min   GFR calc Af Amer 39 (L) >60 mL/min   Anion gap 7 5 - 15    Comment: Performed at [facility], [address], [place], [place] [address]  CBC with Differential/Platelet     Status: Abnormal   Collection Time: 04/09/20  4:59 AM  Result Value Ref Range   WBC 10.0 4.0 - 10.5 K/uL   RBC 3.39 (L) 3.87 - 5.11 MIL/uL   Hemoglobin 8.9 (L) 12.0 - 15.0 g/dL   HCT 31.1 (L) 36.0 - 46.0 %   MCV 91.7 80.0 - 100.0 fL   MCH 26.3 26.0 - 34.0 pg   MCHC 28.6 (L) 30.0 - 36.0 g/dL   RDW 17.4 (H) 11.5 - 15.5 %   Platelets 226 150 - 400 K/uL   nRBC 0.0 0.0 - 0.2 %   Neutrophils Relative % 72 %   Neutro Abs 7.1  1.7 - 7.7 K/uL   Lymphocytes Relative 12 %   Lymphs Abs 1.2 0.7 - 4.0 K/uL   Monocytes Relative 9 %   Monocytes Absolute 0.9 0.1 - 1.0 K/uL   Eosinophils Relative 7 %   Eosinophils Absolute 0.7 (H) 0.0 - 0.5 K/uL   Basophils Relative 0 %   Basophils Absolute 0.0 0.0 - 0.1 K/uL   Immature Granulocytes 0 %   Abs Immature Granulocytes 0.04 0.00 - 0.07 K/uL    Comment: Performed at [facility], [address] [place], [place] [address]  Magnesium     Status: None   Collection Time: 04/09/20  4:59 AM  Result Value Ref Range   Magnesium 1.8 1.7 - 2.4 mg/dL    Comment: Performed at [facility], [address], [place], [place] [address]  Phosphorus     Status: None   Collection Time: 04/09/20  4:59 AM  Result Value Ref Range   Phosphorus 4.2 2.5 - 4.6 mg/dL    Comment: Performed at [facility], [address], [place], [place] [address]      RADIOGRAPHY: I have independently reviewed CT abdomen and pelvis with contrast from 04/06/2020.     ASSESSMENT and PLAN:  1.  Right-sided colon cancer: -Presentation with weakness and hemoglobin of 5.7. -No prior colonoscopy. -CT of the [facility] on 04/06/2020 showed 6 cm circumferential ascending colon mass extending from the ileocecal valve to the hepatic flexure and possibly invading the pericolic fat.  Numerous borderline enlarged pericolonic lymph nodes somewhat worrisome.  No findings for hepatic metastatic disease.  Thick-walled bladder present. -Colonoscopy on 04/06/2020 by Dr. Laural Golden showed large circumferential ulcerated mass extending from the superior aspect of the ileocecal valve to hepatic flexure. -Biopsy consistent with the adenocarcinoma.  MLH1 and PMS2 showed loss of nuclear expression. -Right hemicolectomy by Dr. Constance Haw on 04/08/2020. -Preoperative CEA on 04/04/2020 was 12.2. -I will await pathology report to discuss further plan. -I have talked to her daughter Jennet Scroggin on the phone and explained the plan. -Please make an appointment for follow-up in 1 week upon discharge to discuss final pathology and treatment plan.  2.  Diffuse erythema nodularity of the dome of bladder: -CT scan showed severely thick-walled bladder. -Cystoscopy on 04/08/2020 showed diffuse erythema and nodularity in the posterior wall tracking to the dome.  Ureteral orifices were in the normal locations.  Multiple biopsies from the posterior wall and dome were done. -She has follow-up with Dr. Alyson Ingles in 1 week for voiding trial.  3.  Family history: -Paternal uncle had colon cancer. -Maternal aunt had brain tumor, another maternal aunt had gynecological  malignancy. -Father had lung cancer and was a smoker. -I had see for MMR proteins showed loss of nuclear expression of MLH1 and PMS2. -We will order BRAF mutation testing and MLH1 methylation testing.  4.  Normocytic anemia: -This is from combination of CKD and iron deficiency. -Iron panel on 03/25/2020 showed ferritin 17, saturation 5.  Folic acid and Y11 was normal. -We will consider parenteral iron therapy as outpatient if hemoglobin does not improve.  All questions were answered. The patient knows to call the clinic with any problems, questions or concerns. We can certainly see the patient much sooner if necessary.    Derek Jack

## 2020-04-09 NOTE — Plan of Care (Signed)
  Problem: Acute Rehab PT Goals(only PT should resolve) Goal: Pt will Roll Supine to Side Outcome: Progressing Flowsheets (Taken 04/09/2020 1405) Pt will Roll Supine to Side: with min assist Goal: Pt Will Go Supine/Side To Sit Outcome: Progressing Flowsheets (Taken 04/09/2020 1405) Pt will go Supine/Side to Sit: with minimal assist Goal: Pt Will Go Sit To Supine/Side Outcome: Progressing Flowsheets (Taken 04/09/2020 1405) Pt will go Sit to Supine/Side: with minimal assist Goal: Patient Will Transfer Sit To/From Stand Outcome: Progressing Flowsheets (Taken 04/09/2020 1405) Patient will transfer sit to/from stand: with min guard assist Goal: Pt Will Transfer Bed To Chair/Chair To Bed Outcome: Progressing Flowsheets (Taken 04/09/2020 1405) Pt will Transfer Bed to Chair/Chair to Bed: min guard assist Goal: Pt Will Ambulate Outcome: Progressing Flowsheets (Taken 04/09/2020 1405) Pt will Ambulate:  > 125 feet  with min guard assist  with least restrictive assistive device Goal: Pt Will Go Up/Down Stairs Outcome: Progressing Flowsheets (Taken 04/09/2020 1405) Pt will Go Up / Down Stairs:  3-5 stairs  with min guard assist  with least restrictive assistive device  with rail(s)   Pamala Hurry D. Hartnett-Rands, MS, PT Per [place] [phone] 04/09/2020

## 2020-04-09 NOTE — Progress Notes (Signed)
Patient ID: Gabrielle [name], female   DOB: 01-06-1946, 74 y.o.   MRN: [id number] 1 Day Post-Op  Subjective: Gabrielle [name] was seen this morning and reports feeling well. She was sitting comfortably in bed with her daughter and sister at bedside. She has minimal pain. She reports having no nausea, chest pain, shortness of breath. She reports tolerating a liquid diet well. She has not had a bowel movement since surgery. We discussed her surgery with her and the family and they all expressed understanding of the results of surgery.  Objective: Vital signs in last 24 hours: Temp:  [97.6 F (36.4 C)-98.5 F (36.9 C)] 97.6 F (36.4 C) (05/06 0641) Pulse Rate:  [62-76] 66 (05/06 0641) Resp:  [12-27] 16 (05/06 0641) BP: (109-142)/(34-97) 122/58 (05/06 0641) SpO2:  [92 %-100 %] 100 % (05/06 0641) Last BM Date: 04/08/20  Intake/Output from previous day: 05/05 0701 - 05/06 0700 In: 3237.6 [P.O.:500; I.V.:2537.6; IV Piggyback:200] Out: 700 [Urine:600; Blood:100] Intake/Output this shift: No intake/output data recorded.  Physical Exam Cardiovascular:     Rate and Rhythm: Normal rate and regular rhythm.     Pulses: Normal pulses.     Heart sounds: Normal heart sounds.  Pulmonary:     Effort: Pulmonary effort is normal.     Breath sounds: Normal breath sounds.  Abdominal:     General: Abdomen is flat. Bowel sounds are decreased.     Palpations: Abdomen is soft.  Skin:    General: Skin is warm and dry.     Comments: Surgical site appeared dry and clean without any erythema or discharge  Neurological:     Mental Status: She is alert.    Lab Results:  Recent Labs    04/07/20 0458 04/09/20 0459  WBC 7.9 10.0  HGB 9.5* 8.9*  HCT 31.4* 31.1*  PLT 245 226   BMET Recent Labs    04/08/20 0433 04/09/20 0459  NA 135 138  K 3.0* 3.5  CL 106 103  CO2 24 28  GLUCOSE 117* 96  BUN 11 10  CREATININE 1.23* 1.51*  CALCIUM 8.2* 8.2*   PT/INR No results for input(s): LABPROT, INR in the  last 72 hours.  Studies/Results: ECHOCARDIOGRAM COMPLETE  Result Date: 04/07/2020    ECHOCARDIOGRAM REPORT   Patient Name:   Gabrielle [name] Date of Exam: 04/07/2020 Medical Rec #:  [id number]       Height:       62.0 in Accession #:    [id number]      Weight:       150.0 lb Date of Birth:  [birth year]      BSA:          1.692 m Patient Age:    78 years        BP:           123/66 mmHg Patient Gender: F               HR:           72 bpm. Exam Location:  [facility] Procedure: 2D Echo Indications:    Preoperative evaluation  History:        Patient has no prior history of Echocardiogram examinations.                 Risk Factors:Hypertension, Dyslipidemia and Non-Smoker. Stage 3                 chronic kidney disease, Macular degeneration.  Sonographer:  Leavy Cella RDCS (AE) Referring Phys: [place]  1. Left ventricular ejection fraction, by estimation, is 65 to 70%. The left ventricle has normal function. The left ventricle has no regional wall motion abnormalities. There is mild left ventricular hypertrophy. Left ventricular diastolic parameters are consistent with Grade I diastolic dysfunction (impaired relaxation).  2. Right ventricular systolic function is normal. The right ventricular size is normal. Tricuspid regurgitation signal is inadequate for assessing PA pressure.  3. Left atrial size was moderately dilated.  4. The mitral valve is grossly normal. Trivial mitral valve regurgitation.  5. The aortic valve is tricuspid. Aortic valve regurgitation is trivial. Mild aortic valve sclerosis is present, with no evidence of aortic valve stenosis.  6. The inferior vena cava is normal in size with greater than 50% respiratory variability, suggesting right atrial pressure of 3 mmHg. FINDINGS  Left Ventricle: Left ventricular ejection fraction, by estimation, is 65 to 70%. The left ventricle has normal function. The left ventricle has no regional wall motion abnormalities. The left  ventricular internal cavity size was normal in size. There is  mild left ventricular hypertrophy. Left ventricular diastolic parameters are consistent with Grade I diastolic dysfunction (impaired relaxation). Right Ventricle: The right ventricular size is normal. No increase in right ventricular wall thickness. Right ventricular systolic function is normal. Tricuspid regurgitation signal is inadequate for assessing PA pressure. Left Atrium: Left atrial size was moderately dilated. Right Atrium: Right atrial size was normal in size. Pericardium: There is no evidence of pericardial effusion. Presence of pericardial fat pad. Mitral Valve: The mitral valve is grossly normal. Mild mitral annular calcification. Trivial mitral valve regurgitation. Tricuspid Valve: The tricuspid valve is grossly normal. Tricuspid valve regurgitation is trivial. Aortic Valve: The aortic valve is tricuspid. Aortic valve regurgitation is trivial. Mild aortic valve sclerosis is present, with no evidence of aortic valve stenosis. Mild to moderate aortic valve annular calcification. Pulmonic Valve: The pulmonic valve was grossly normal. Pulmonic valve regurgitation is trivial. Aorta: The aortic root is normal in size and structure. Venous: The inferior vena cava is normal in size with greater than 50% respiratory variability, suggesting right atrial pressure of 3 mmHg. IAS/Shunts: No atrial level shunt detected by color flow Doppler. Rozann Lesches MD Electronically signed by Rozann Lesches MD Signature Date/Time: 04/07/2020/10:04:33 AM    Final     Anti-infectives: Anti-infectives (From admission, onward)   Start     Dose/Rate Route Frequency Ordered Stop   04/08/20 2200  cefoTEtan (CEFOTAN) 2 g in sodium chloride 0.9 % 100 mL IVPB     2 g 200 mL/hr over 30 Minutes Intravenous Every 12 hours 04/08/20 1350 04/10/20 0959   04/08/20 0600  cefoTEtan (CEFOTAN) 2 g in sodium chloride 0.9 % 100 mL IVPB     2 g 200 mL/hr over 30 Minutes  Intravenous On call to O.R. 04/07/20 1905 04/08/20 1041   04/07/20 2200  neomycin (MYCIFRADIN) tablet 1,000 mg     1,000 mg Oral  Once 04/06/20 1104 04/07/20 2226   04/07/20 2200  metroNIDAZOLE (FLAGYL) tablet 1,000 mg     1,000 mg Oral  Once 04/06/20 1104 04/07/20 2226   04/07/20 1500  neomycin (MYCIFRADIN) tablet 1,000 mg     1,000 mg Oral  Once 04/06/20 1104 04/07/20 1509   04/07/20 1500  metroNIDAZOLE (FLAGYL) tablet 1,000 mg     1,000 mg Oral  Once 04/06/20 1104 04/07/20 1509   04/07/20 1400  neomycin (MYCIFRADIN) tablet 1,000 mg  1,000 mg Oral  Once 04/06/20 1104 04/07/20 1318   04/07/20 1400  metroNIDAZOLE (FLAGYL) tablet 1,000 mg     1,000 mg Oral  Once 04/06/20 1104 04/07/20 1318   04/04/20 1930  cefTRIAXone (ROCEPHIN) 1 g in sodium chloride 0.9 % 100 mL IVPB     1 g 200 mL/hr over 30 Minutes Intravenous Every 24 hours 04/04/20 1919 04/06/20 2136      Assessment/Plan: s/p Procedure(s): PARTIAL COLECTOMY CYSTOSCOPY WITH BIOPSY Gabrielle [name] is a 74 year old female on post op day 1 for partial colectomy and cystoscopy with biopsy. She is feeling well today with only a mild drop in Hgb, likely due to blood loss associated with surgery. K+ is on the low side, and will replete to encourage bowel movement. Has remained afebrile. Overall doing well clinically with no significant pain or nausea and is tolerating diet well.  - encourage mobilization - replete K+ with KCl  - Entereg 12 mg BID for 14 days - Will need to have BM before advancing diet   LOS: 4 days    Bartholome Bill 04/09/2020

## 2020-04-09 NOTE — Evaluation (Signed)
Physical Therapy Evaluation Patient Details Name: Gabrielle [name] MRN: [id number] DOB: 1946-03-05 Today's Date: 04/09/2020   History of Present Illness  74 y.o. female with medical history significant for hypertension, CKD 3.  Patient presented to the ED with complaints of generalized weakness over the past 1 to 2 weeks.  She was evaluated by her primary care provider and her hemoglobin was low.  Patient has problems with her vision, so she is not sure if her stools have been black, but her daughter present at bedside confirms patient has been having black stools.  She also reports black stools and overall symptoms started when she started taking her iron pills.  Reports some mild right lower abdominal pain, with intermittent dysuria over the past week.  No vomiting.    She underwent colonoscopy on 04/05/2020 which revealed malignant tumor involving ascending colon and endoscopically appeared to be adenocarcinoma of the colon.  General surgery was consulted.  The patient underwent right hemicolectomy on 04/08/2020.  CT also shows severe bladder wall thickening.  The patient did develop hematuria.  Urology was consulted.  The patient underwent cystoscopy on 04/08/2020.    Clinical Impression  Pt admitted with above diagnosis. Daughter present throughout session. Patient agreeable to PT evaluation today and eager to get out of bed. Patient overall performed well, requiring some assistance for bed mobility, transfers and ambulation with RW. Patient has never used an assistive device before so patient and daughter educated on use of RW and gait belt for safety. Upon standing, patient was unsteady. Patient's balance and sureness increased as she ambulated with RW. Cues to walk within walker and how to perform turns. Daughter nervous about how her mother will perform in daughter's home. PT recommended daughter take personal use gait belt home with her for use as much for patient's safety as daughter's confidence. Pt  currently with functional limitations due to the deficits listed below (see PT Problem List). Pt will benefit from skilled PT to increase their independence and safety with mobility to allow discharge to the venue listed below.       Follow Up Recommendations Home health PT;Supervision - Intermittent;Supervision for mobility/OOB    Equipment Recommendations  Rolling walker with 5" wheels;3in1 (PT)    Recommendations for Other Services       Precautions / Restrictions Precautions Precautions: Fall Restrictions Weight Bearing Restrictions: No      Mobility  Bed Mobility Overal bed mobility: Needs Assistance Bed Mobility: Rolling;Sidelying to Sit Rolling: Supervision Sidelying to sit: HOB elevated;Min guard          Transfers Overall transfer level: Needs assistance Equipment used: Rolling walker (2 wheeled) Transfers: Sit to/from [facility] Sit to Stand: Min assist;Min guard Stand pivot transfers: Min guard;Min assist       General transfer comment: verbal cues for weight shift forward, sequencing of steps and placement of hands  Ambulation/Gait Ambulation/Gait assistance: Min guard Gait Distance (Feet): 275 Feet Assistive device: Rolling walker (2 wheeled) Gait Pattern/deviations: Step-through pattern;Decreased step length - right;Decreased step length - left;Decreased stride length Gait velocity: decreased   General Gait Details: unsteady upon standing, gained stability as she walked further, step length improved as well, used RW, cues to walk within walker and how to perform turns, on room air  Stairs            Wheelchair Mobility    Modified Rankin (Stroke Patients Only)       Balance Overall balance assessment: Needs assistance Sitting-balance support: Bilateral  upper extremity supported;Feet supported Sitting balance-Leahy Scale: Fair     Standing balance support: Bilateral upper extremity supported;During functional  activity Standing balance-Leahy Scale: Fair Standing balance comment: fair with RW                             Pertinent Vitals/Pain Pain Assessment: 0-10 Pain Score: 2  Pain Location: abdomen - post surgical Pain Intervention(s): Limited activity within patient's tolerance;Monitored during session;Premedicated before session    Home Living Family/patient expects to be discharged to:: Other (Comment)(daughter's house is expected discharge.) Living Arrangements: Alone Available Help at Discharge: Family;Available 24 hours/day(daughter and grandson) Type of Home: Mobile home Home Access: Stairs to enter Entrance Stairs-Rails: Right;Left;Can reach both   Home Layout: One level Home Equipment: Hand held shower head      Prior Function Level of Independence: Needs assistance   Gait / Transfers Assistance Needed: Independent without assistive devices  ADL's / Homemaking Assistance Needed: Independent for BADLs; assistance for cooking, no longer drives due to vision, assistance from family and community for food        Hand Dominance   Dominant Hand: Right    Extremity/Trunk Assessment   Upper Extremity Assessment Upper Extremity Assessment: Generalized weakness    Lower Extremity Assessment Lower Extremity Assessment: Generalized weakness    Cervical / Trunk Assessment Cervical / Trunk Assessment: Normal  Communication   Communication: HOH  Cognition Arousal/Alertness: Awake/alert Behavior During Therapy: WFL for tasks assessed/performed Overall Cognitive Status: Within Functional Limits for tasks assessed                                        General Comments      Exercises     Assessment/Plan    PT Assessment Patient needs continued PT services  PT Problem List Decreased strength;Decreased mobility;Decreased activity tolerance;Decreased balance;Decreased knowledge of use of DME       PT Treatment Interventions DME  instruction;Therapeutic activities;Gait training;Therapeutic exercise;Patient/family education;Balance training;Stair training    PT Goals (Current goals can be found in the Care Plan section)  Acute Rehab PT Goals Patient Stated Goal: Go home with daughter for now; HHPT and nursing for wound. PT Goal Formulation: With patient/family Time For Goal Achievement: 04/23/20 Potential to Achieve Goals: Good    Frequency Min 3X/week   Barriers to discharge        Co-evaluation               AM-PAC PT "6 Clicks" Mobility  Outcome Measure Help needed turning from your back to your side while in a flat bed without using bedrails?: A Little Help needed moving from lying on your back to sitting on the side of a flat bed without using bedrails?: A Lot Help needed moving to and from a bed to a chair (including a wheelchair)?: A Little Help needed standing up from a chair using your arms (e.g., wheelchair or bedside chair)?: A Little Help needed to walk in hospital room?: A Little Help needed climbing 3-5 steps with a railing? : A Lot 6 Click Score: 16    End of Session Equipment Utilized During Treatment: Gait belt Activity Tolerance: Patient tolerated treatment well;Patient limited by fatigue Patient left: in chair;with chair alarm set;with family/visitor present;with call bell/phone within reach Nurse Communication: Mobility status PT Visit Diagnosis: Unsteadiness on feet (R26.81);Muscle weakness (generalized) (M62.81);Other abnormalities  of gait and mobility (R26.89)    Time: 1300-1400 PT Time Calculation (min) (ACUTE ONLY): 60 min   Charges:   PT Evaluation $PT Eval Low Complexity: 1 Low $PT Eval Moderate Complexity: 1 Mod PT Treatments $Gait Training: 8-22 mins $Therapeutic Activity: 8-22 mins $Self Care/Home Management: 8-22        Floria Raveling. Hartnett-Rands, MS, PT Per [facility] [phone] 04/09/2020, 2:00 PM

## 2020-04-10 LAB — COMPREHENSIVE METABOLIC PANEL
ALT: 5 U/L (ref 0–44)
AST: 9 U/L — ABNORMAL LOW (ref 15–41)
Albumin: 2.2 g/dL — ABNORMAL LOW (ref 3.5–5.0)
Alkaline Phosphatase: 47 U/L (ref 38–126)
Anion gap: 6 (ref 5–15)
BUN: 10 mg/dL (ref 8–23)
CO2: 26 mmol/L (ref 22–32)
Calcium: 8.2 mg/dL — ABNORMAL LOW (ref 8.9–10.3)
Chloride: 105 mmol/L (ref 98–111)
Creatinine, Ser: 1.27 mg/dL — ABNORMAL HIGH (ref 0.44–1.00)
GFR calc Af Amer: 48 mL/min — ABNORMAL LOW (ref >60)
GFR calc non Af Amer: 42 mL/min — ABNORMAL LOW (ref >60)
Glucose, Bld: 94 mg/dL (ref 70–99)
Potassium: 4.3 mmol/L (ref 3.5–5.1)
Sodium: 137 mmol/L (ref 135–145)
Total Bilirubin: 0.3 mg/dL (ref 0.3–1.2)
Total Protein: 5.1 g/dL — ABNORMAL LOW (ref 6.5–8.1)

## 2020-04-10 LAB — CBC WITH DIFFERENTIAL/PLATELET
Abs Immature Granulocytes: 0.02 10*3/uL (ref 0.00–0.07)
Basophils Absolute: 0 10*3/uL (ref 0.0–0.1)
Basophils Relative: 0 %
Eosinophils Absolute: 0.7 10*3/uL — ABNORMAL HIGH (ref 0.0–0.5)
Eosinophils Relative: 8 %
HCT: 28.4 % — ABNORMAL LOW (ref 36.0–46.0)
Hemoglobin: 8.3 g/dL — ABNORMAL LOW (ref 12.0–15.0)
Immature Granulocytes: 0 %
Lymphocytes Relative: 10 %
Lymphs Abs: 0.9 10*3/uL (ref 0.7–4.0)
MCH: 25.8 pg — ABNORMAL LOW (ref 26.0–34.0)
MCHC: 29.2 g/dL — ABNORMAL LOW (ref 30.0–36.0)
MCV: 88.2 fL (ref 80.0–100.0)
Monocytes Absolute: 0.6 10*3/uL (ref 0.1–1.0)
Monocytes Relative: 7 %
Neutro Abs: 6.5 10*3/uL (ref 1.7–7.7)
Neutrophils Relative %: 75 %
Platelets: 215 10*3/uL (ref 150–400)
RBC: 3.22 MIL/uL — ABNORMAL LOW (ref 3.87–5.11)
RDW: 17.7 % — ABNORMAL HIGH (ref 11.5–15.5)
WBC: 8.7 10*3/uL (ref 4.0–10.5)
nRBC: 0 % (ref 0.0–0.2)

## 2020-04-10 LAB — TYPE AND SCREEN
ABO/RH(D): A NEG
Antibody Screen: POSITIVE
Donor AG Type: NEGATIVE
Donor AG Type: NEGATIVE
Unit division: 0
Unit division: 0

## 2020-04-10 LAB — BPAM RBC
Blood Product Expiration Date: 202105202359
Unit Type and Rh: 5100

## 2020-04-10 LAB — PHOSPHORUS: Phosphorus: 2.1 mg/dL — ABNORMAL LOW (ref 2.5–4.6)

## 2020-04-10 LAB — MAGNESIUM: Magnesium: 2.2 mg/dL (ref 1.7–2.4)

## 2020-04-10 MED ORDER — PANTOPRAZOLE SODIUM 40 MG PO TBEC
40.0000 mg | DELAYED_RELEASE_TABLET | Freq: Every day | ORAL | Status: DC
  Administered 2020-04-11 – 2020-04-12 (×2): 40 mg via ORAL
  Filled 2020-04-10 (×2): qty 1

## 2020-04-10 MED ORDER — ENOXAPARIN SODIUM 40 MG/0.4ML ~~LOC~~ SOLN
40.0000 mg | SUBCUTANEOUS | Status: DC
  Administered 2020-04-11 – 2020-04-12 (×2): 40 mg via SUBCUTANEOUS
  Filled 2020-04-10 (×2): qty 0.4

## 2020-04-10 MED ORDER — POTASSIUM & SODIUM PHOSPHATES 280-160-250 MG PO PACK
1.0000 | PACK | Freq: Three times a day (TID) | ORAL | Status: AC
  Administered 2020-04-10 (×2): 1 via ORAL
  Filled 2020-04-10 (×2): qty 1

## 2020-04-10 MED ORDER — POTASSIUM CHLORIDE 20 MEQ PO PACK
40.0000 meq | PACK | Freq: Every day | ORAL | Status: DC
  Administered 2020-04-11: 40 meq via ORAL
  Filled 2020-04-10: qty 2

## 2020-04-10 NOTE — Care Management Important Message (Signed)
Important Message  Patient Details  Name: Gabrielle [name] MRN: [id number] Date of Birth: 1945/12/16   Medicare Important Message Given:  Yes     Tommy Medal 04/10/2020, 3:29 PM

## 2020-04-10 NOTE — Progress Notes (Signed)
Incentive spirometer provided to patient. Education given and patient demonstrated use and compliance.  Elodia Florence RN

## 2020-04-10 NOTE — Progress Notes (Signed)
PROGRESS NOTE  Gabrielle [name] KWI:[id number] DOB: [birth year] DOA: 04/04/2020 PCP: Janora Norlander, DO   Brief History: 74 y.o.femalewith medical history significant forhypertension, CKD 3. Patient presented to the ED with complaints of generalized weakness over the past 1 to 2 weeks.She was evaluated by her primary care provider and her hemoglobin was low. Patient has problems with her vision, soshe is not sure if her stools have been black, but her daughter present at bedside confirms patient has been having black stools.She also reports black stools and overall symptoms started when she started taking her iron pills. Reports some mild right lower abdominal pain, with intermittent dysuria over the past week. No vomiting.She underwent colonoscopy on 04/05/2020 which revealed malignant tumor involving ascending colon and endoscopically appeared to be adenocarcinoma of the colon.General surgery was consulted. The patient underwent right hemicolectomy on 04/08/2020. CT also shows severe bladder wall thickening. The patient did develop hematuria. Urology was consulted. The patient underwent cystoscopy on 04/08/2020.  Assessment/Plan: Adenocarcinoma of the colon -Right hemicolectomy on 04/08/2020 -Postoperative management per Dr. Blake Divine -operative noted extension of mass into the lateral/ posterior peritoneal lining/ side wall.  -diet advancement per surgery -increase activity-->PT eval, OOB-->HHPT -judicious pain control -one BM 04/10/20 afternoon  Symptomatic anemia -Transfused 2 units PRBC during this admission -Colonoscopy as discussed above  Iron deficiency anemia -Ferrous sulfate started  Acute urinary retention/bladder wall thickening/hematuria -04/08/20--cystoscopy and bladder bx with fulgeration-->Diffuse erythema and nodularity worse at the dome -follow up biopsies  Pyuria -04/04/20 UA >50 WBC -cultures neg -d/c  ceftriaxone  Hypokalemia/Hypophosphatemia -replete -check mag--1.9  Hyperlipidemia -Restart statin once able to tolerate p.o.  CKD stage IIIb -Baseline creatinine 1.1-1.5 -A.m. BMP  Essential hypertension -Holding losartan/HCTZ -BP remains controlled   Total time spent 35 minutes.  Greater than 50% spent face to face counseling and coordinating care.   Disposition Plan: Patient From: Home D/C Place: HomevSNF - 2-3 Days Barriers: Not Clinically Stable--awaiting return of bowel function and diet tolerance  Family Communication:Daughter and sister at bedside 5/6  Consultants:General surgery/ urology  Code Status: FULL  DVT Prophylaxis: SCDs   Procedures: As Listed in Progress Note Above  Antibiotics: None   Subjective:   Objective: Vitals:   04/09/20 2002 04/09/20 2013 04/10/20 0518 04/10/20 1548  BP: (!) 132/50  (!) 142/58 (!) 153/88  Pulse: 67  68 67  Resp: 16  16 16   Temp: 98.5 F (36.9 C)  98.5 F (36.9 C) 98.6 F (37 C)  TempSrc: Oral  Oral Oral  SpO2: 97% (!) 89% 100% 99%  Weight:      Height:        Intake/Output Summary (Last 24 hours) at 04/10/2020 1726 Last data filed at 04/10/2020 0556 Gross per 24 hour  Intake 403.94 ml  Output 800 ml  Net -396.06 ml   Weight change:  Exam:   General:  Pt is alert, follows commands appropriately, not in acute distress  HEENT: No icterus, No thrush, No neck mass, [place]/AT  Cardiovascular: RRR, S1/S2, no rubs, no gallops  Respiratory: CTA bilaterally, no wheezing, no crackles, no rhonchi  Abdomen: Soft/+BS, non tender, non distended, no guarding  Extremities: No edema, No lymphangitis, No petechiae, No rashes, no synovitis   Data Reviewed: I have personally reviewed following labs and imaging studies Basic Metabolic Panel: Recent Labs  Lab 04/04/20 1519 04/05/20 0618 04/06/20 0522 04/07/20 0458 04/08/20 0433 04/09/20 0459 04/10/20 0452  NA 134*   < >  139 137 135  138 137  K 3.0*   < > 3.4* 3.5 3.0* 3.5 4.3  CL 99   < > 105 106 106 103 105  CO2 26   < > 26 23 24 28 26   GLUCOSE 128*   < > 112* 104* 117* 96 94  BUN 36*   < > 24* 13 11 10 10   CREATININE 1.59*   < > 1.35* 1.19* 1.23* 1.51* 1.27*  CALCIUM 8.7*   < > 8.5* 8.2* 8.2* 8.2* 8.2*  MG 2.4  --   --  2.0  --  1.8 2.2  PHOS  --   --   --   --   --  4.2 2.1*   < > = values in this interval not displayed.   Liver Function Tests: Recent Labs  Lab 04/04/20 1519 04/07/20 0458 04/08/20 0433 04/09/20 0459 04/10/20 0452  AST 11* 7* 10* 9* 9*  ALT 6 5 5 5 5   ALKPHOS 62 56 54 49 47  BILITOT 0.5 0.6 0.5 0.3 0.3  PROT 7.0 5.6* 5.8* 5.2* 5.1*  ALBUMIN 3.4* 2.7* 2.7* 2.3* 2.2*   No results for input(s): LIPASE, AMYLASE in the last 168 hours. No results for input(s): AMMONIA in the last 168 hours. Coagulation Profile: Recent Labs  Lab 04/04/20 1519  INR 1.0   CBC: Recent Labs  Lab 04/04/20 1519 04/04/20 1519 04/05/20 0618 04/06/20 0522 04/07/20 0458 04/09/20 0459 04/10/20 0452  WBC 7.8   < > 6.4 8.5 7.9 10.0 8.7  NEUTROABS 6.0  --   --   --  5.7 7.1 6.5  HGB 5.7*   < > 7.1* 9.5* 9.5* 8.9* 8.3*  HCT 20.1*   < > 24.3* 30.7* 31.4* 31.1* 28.4*  MCV 85.5   < > 85.9 85.5 87.5 91.7 88.2  PLT 257   < > 232 241 245 226 215   < > = values in this interval not displayed.   Cardiac Enzymes: No results for input(s): CKTOTAL, CKMB, CKMBINDEX, TROPONINI in the last 168 hours. BNP: Invalid input(s): POCBNP CBG: Recent Labs  Lab 04/05/20 2113 04/06/20 1104 04/06/20 1626  GLUCAP 85 85 90   HbA1C: No results for input(s): HGBA1C in the last 72 hours. Urine analysis:    Component Value Date/Time   COLORURINE YELLOW 04/04/2020 1758   APPEARANCEUR CLEAR 04/04/2020 1758   LABSPEC 1.008 04/04/2020 1758   PHURINE 8.0 04/04/2020 1758   GLUCOSEU NEGATIVE 04/04/2020 1758   HGBUR NEGATIVE 04/04/2020 1758   BILIRUBINUR NEGATIVE 04/04/2020 1758   KETONESUR NEGATIVE 04/04/2020 1758   PROTEINUR  NEGATIVE 04/04/2020 1758   NITRITE NEGATIVE 04/04/2020 1758   LEUKOCYTESUR LARGE (A) 04/04/2020 1758   Sepsis Labs: @LABRCNTIP (procalcitonin:4,lacticidven:4) ) Recent Results (from the past 240 hour(s))  Respiratory Panel by RT PCR (Flu A&B, Covid) - Nasopharyngeal Swab     Status: None   Collection Time: 04/04/20  3:37 PM   Specimen: Nasopharyngeal Swab  Result Value Ref Range Status   SARS Coronavirus 2 by RT PCR NEGATIVE NEGATIVE Final    Comment: (NOTE) SARS-CoV-2 target nucleic acids are NOT DETECTED. The SARS-CoV-2 RNA is generally detectable in upper respiratoy specimens during the acute phase of infection. The lowest concentration of SARS-CoV-2 viral copies this assay can detect is 131 copies/mL. A negative result does not preclude SARS-Cov-2 infection and should not be used as the sole basis for treatment or other patient management decisions. A negative result may occur with  improper specimen collection/handling,  submission of specimen other than nasopharyngeal swab, presence of viral mutation(s) within the areas targeted by this assay, and inadequate number of viral copies (<131 copies/mL). A negative result must be combined with clinical observations, patient history, and epidemiological information. The expected result is Negative. Fact Sheet for Patients:  PinkCheek.be Fact Sheet for Healthcare Providers:  GravelBags.it This test is not yet ap proved or cleared by the [place] FDA and  has been authorized for detection and/or diagnosis of SARS-CoV-2 by FDA under an Emergency Use Authorization (EUA). This EUA will remain  in effect (meaning this test can be used) for the duration of the COVID-19 declaration under Section 564(b)(1) of the Act, 21 U.S.C. section 360bbb-3(b)(1), unless the authorization is terminated or revoked sooner.    Influenza A by PCR NEGATIVE NEGATIVE Final   Influenza B by PCR  NEGATIVE NEGATIVE Final    Comment: (NOTE) The Xpert Xpress SARS-CoV-2/FLU/RSV assay is intended as an aid in  the diagnosis of influenza from Nasopharyngeal swab specimens and  should not be used as a sole basis for treatment. Nasal washings and  aspirates are unacceptable for Xpert Xpress SARS-CoV-2/FLU/RSV  testing. Fact Sheet for Patients: PinkCheek.be Fact Sheet for Healthcare Providers: GravelBags.it This test is not yet approved or cleared by the [place] FDA and  has been authorized for detection and/or diagnosis of SARS-CoV-2 by  FDA under an Emergency Use Authorization (EUA). This EUA will remain  in effect (meaning this test can be used) for the duration of the  Covid-19 declaration under Section 564(b)(1) of the Act, 21  U.S.C. section 360bbb-3(b)(1), unless the authorization is  terminated or revoked. Performed at [facility], [address], [place], [place] [address]   Urine culture     Status: None   Collection Time: 04/04/20  5:59 PM   Specimen: Urine, Clean Catch  Result Value Ref Range Status   Specimen Description   Final    URINE, CLEAN CATCH Performed at [facility], [address], [place], [place] [address]    Special Requests   Final    NONE Performed at [facility], [address], [place], [place] [address]    Culture   Final    NO GROWTH Performed at [facility], [place] [address] [place], [place] [address]    Report Status 04/06/2020 FINAL  Final  Surgical pcr screen     Status: None   Collection Time: 04/07/20  7:59 PM   Specimen: Nasal Mucosa; Nasal Swab  Result Value Ref Range Status   MRSA, PCR NEGATIVE NEGATIVE Final   Staphylococcus aureus NEGATIVE NEGATIVE Final    Comment: (NOTE) The Xpert SA Assay (FDA approved for NASAL specimens in patients 22 years of age and older), is one component of a comprehensive surveillance program. It is not intended to diagnose infection  nor to guide or monitor treatment. Performed at [facility], [address] [place], [place] [address]      Scheduled Meds:  acetaminophen  1,000 mg Oral Q6H   alvimopan  12 mg Oral BID   atorvastatin  20 mg Oral Daily   Chlorhexidine Gluconate Cloth  6 each Topical Daily   docusate sodium  100 mg Oral BID   dorzolamide-timolol  1 drop Both Eyes Daily   [START ON 04/11/2020] enoxaparin (LOVENOX) injection  40 mg Subcutaneous Q24H   ferrous sulfate  325 mg Oral BID WC   gabapentin  300 mg Oral BID   pantoprazole (PROTONIX) IV  40 mg Intravenous Q24H   [START ON 04/11/2020] potassium chloride  40 mEq Oral Daily   sodium chloride flush  3 mL Intravenous Q12H   Continuous Infusions:  sodium chloride     lactated ringers 50 mL/hr at 04/09/20 0545    Procedures/Studies: CT ABDOMEN PELVIS W CONTRAST  Result Date: 04/06/2020 CLINICAL DATA:  Ascending colon mass seen on colonoscopy today. EXAM: CT ABDOMEN AND PELVIS WITH CONTRAST TECHNIQUE: Multidetector CT imaging of the abdomen and pelvis was performed using the standard protocol following bolus administration of intravenous contrast. CONTRAST:  3mL OMNIPAQUE IOHEXOL 300 MG/ML  SOLN COMPARISON:  None. FINDINGS: Lower chest: The lung bases are clear of an acute process. No infiltrates or effusions. No worrisome pulmonary nodules. The heart is within normal limits in size for age. No pericardial effusion. Mitral valve annular calcifications are noted. Moderate aortic calcifications. The distal esophagus is grossly normal Hepatobiliary: No hepatic lesions are identified to suggest hepatic metastatic disease. The gallbladder is normal. No intra or extrahepatic biliary dilatation. Pancreas: No mass, inflammation or ductal dilatation. Spleen: Normal size. No focal lesions. Adrenals/Urinary Tract: The adrenal glands and kidneys are unremarkable. No worrisome renal lesions or hydronephrosis. Small renal cysts are noted. Severely thick-walled  bladder which contains a Foley catheter. Findings could be due to severe chronic cystitis. I do not see a discrete mass but could not exclude the possibility of infiltrating tumor. Patient may need cystoscopic evaluation unless this is a known entity. Stomach/Bowel: The stomach, duodenum and small bowel are unremarkable. No acute inflammatory changes, mass lesions or obstructive findings. The terminal ileum is normal. The appendix is normal. Fairly extensive circumferential ascending colon mass extending from the ileocecal valve to the hepatic flexure and measuring approximately 6 cm in length. No obstruction. Contrast moves through this area and to the distal colon. There are moderate surrounding interstitial changes and could not exclude direct invasion of the pericolonic fat. There also numerous borderline enlarged pericolonic lymph nodes which are somewhat worrisome for tumor involvement. Vascular/Lymphatic: Advanced atherosclerotic calcifications involving the aorta and iliac arteries. No enlarged retroperitoneal lymph nodes or upper abdominal lymph nodes. Reproductive: The uterus and ovaries are unremarkable. Other: Small amount of free pelvic fluid is noted. No inguinal mass or adenopathy. Musculoskeletal: No significant bony findings. IMPRESSION: 1. 6 cm circumferential ascending colon mass extending from the ileocecal valve to the hepatic flexure and possibly invading the pericolonic fat. Numerous borderline enlarged pericolonic lymph nodes are somewhat worrisome. 2. No findings for hepatic metastatic disease. 3. Severely thick-walled bladder which contains a Foley catheter. Findings could be due to severe chronic cystitis. Underlying infiltrating tumor is possible. Recommend cystoscopic evaluation unless this is a known entity. 4. Advanced atherosclerotic calcifications involving the aorta and iliac arteries. Aortic Atherosclerosis (ICD10-I70.0). Electronically Signed   By: Marijo Sanes M.D.   On:  04/06/2020 11:56   ECHOCARDIOGRAM COMPLETE  Result Date: 04/07/2020    ECHOCARDIOGRAM REPORT   Patient Name:   YARI SZELIGA Date of Exam: 04/07/2020 Medical Rec #:  [id number]       Height:       62.0 in Accession #:    [id number]      Weight:       150.0 lb Date of Birth:  1946-02-18      BSA:          1.692 m Patient Age:    61 years        BP:  123/66 mmHg Patient Gender: F               HR:           72 bpm. Exam Location:  [facility] Procedure: 2D Echo Indications:    Preoperative evaluation  History:        Patient has no prior history of Echocardiogram examinations.                 Risk Factors:Hypertension, Dyslipidemia and Non-Smoker. Stage 3                 chronic kidney disease, Macular degeneration.  Sonographer:    Leavy Cella RDCS (AE) Referring Phys: [place]  1. Left ventricular ejection fraction, by estimation, is 65 to 70%. The left ventricle has normal function. The left ventricle has no regional wall motion abnormalities. There is mild left ventricular hypertrophy. Left ventricular diastolic parameters are consistent with Grade I diastolic dysfunction (impaired relaxation).  2. Right ventricular systolic function is normal. The right ventricular size is normal. Tricuspid regurgitation signal is inadequate for assessing PA pressure.  3. Left atrial size was moderately dilated.  4. The mitral valve is grossly normal. Trivial mitral valve regurgitation.  5. The aortic valve is tricuspid. Aortic valve regurgitation is trivial. Mild aortic valve sclerosis is present, with no evidence of aortic valve stenosis.  6. The inferior vena cava is normal in size with greater than 50% respiratory variability, suggesting right atrial pressure of 3 mmHg. FINDINGS  Left Ventricle: Left ventricular ejection fraction, by estimation, is 65 to 70%. The left ventricle has normal function. The left ventricle has no regional wall motion abnormalities. The left ventricular  internal cavity size was normal in size. There is  mild left ventricular hypertrophy. Left ventricular diastolic parameters are consistent with Grade I diastolic dysfunction (impaired relaxation). Right Ventricle: The right ventricular size is normal. No increase in right ventricular wall thickness. Right ventricular systolic function is normal. Tricuspid regurgitation signal is inadequate for assessing PA pressure. Left Atrium: Left atrial size was moderately dilated. Right Atrium: Right atrial size was normal in size. Pericardium: There is no evidence of pericardial effusion. Presence of pericardial fat pad. Mitral Valve: The mitral valve is grossly normal. Mild mitral annular calcification. Trivial mitral valve regurgitation. Tricuspid Valve: The tricuspid valve is grossly normal. Tricuspid valve regurgitation is trivial. Aortic Valve: The aortic valve is tricuspid. Aortic valve regurgitation is trivial. Mild aortic valve sclerosis is present, with no evidence of aortic valve stenosis. Mild to moderate aortic valve annular calcification. Pulmonic Valve: The pulmonic valve was grossly normal. Pulmonic valve regurgitation is trivial. Aorta: The aortic root is normal in size and structure. Venous: The inferior vena cava is normal in size with greater than 50% respiratory variability, suggesting right atrial pressure of 3 mmHg. IAS/Shunts: No atrial level shunt detected by color flow Doppler. Rozann Lesches MD Electronically signed by Rozann Lesches MD Signature Date/Time: 04/07/2020/10:04:33 AM    Final     Orson Eva, DO  [facility]  If 7PM-7AM, please contact night-coverage [email or web address] Password [email or web address] 04/10/2020, 5:26 PM   LOS: 5 days

## 2020-04-10 NOTE — Progress Notes (Signed)
Physical Therapy Treatment Patient Details Name: Gabrielle [name] MRN: [id number] DOB: [birth year] Today's Date: 04/10/2020    History of Present Illness 74 y.o. female with medical history significant for hypertension, CKD 3.  Patient presented to the ED with complaints of generalized weakness over the past 1 to 2 weeks.  She was evaluated by her primary care provider and her hemoglobin was low.  Patient has problems with her vision, so she is not sure if her stools have been black, but her daughter present at bedside confirms patient has been having black stools.  She also reports black stools and overall symptoms started when she started taking her iron pills.  Reports some mild right lower abdominal pain, with intermittent dysuria over the past week.  No vomiting.    She underwent colonoscopy on 04/05/2020 which revealed malignant tumor involving ascending colon and endoscopically appeared to be adenocarcinoma of the colon.  General surgery was consulted.  The patient underwent right hemicolectomy on 04/08/2020.  CT also shows severe bladder wall thickening.  The patient did develop hematuria.  Urology was consulted.  The patient underwent cystoscopy on 04/08/2020.    PT Comments    Patient able to complete exercises seated in chair with verbal cueing for mechanics following demonstration. She demonstrates unsteadiness upon initial standing which improves with use of RW. Patient ambulates slowly with RW and min cueing to stay inside RW. She is limited by fatigue with ambulation and does not have loss of balance. She turns very slowly and demonstrates unsteadiness when pivoting. She is educated on using RW until she gets to the chair and not stopping using RW early. Patient left in chair -RN aware. Patient will benefit from continued physical therapy in hospital and recommended venue below to increase strength, balance, endurance for safe ADLs and gait.   Follow Up Recommendations  Home health  PT;Supervision - Intermittent;Supervision for mobility/OOB     Equipment Recommendations  Rolling walker with 5" wheels;3in1 (PT)    Recommendations for Other Services       Precautions / Restrictions Precautions Precautions: Fall Restrictions Weight Bearing Restrictions: No    Mobility  Bed Mobility               General bed mobility comments: seated in chair at beginning of session  Transfers Overall transfer level: Needs assistance Equipment used: Rolling walker (2 wheeled) Transfers: Sit to/from [employer] Transfers Sit to Stand: Min assist;Min guard Stand pivot transfers: Min guard;Min assist       General transfer comment: verbal cues for sequencing with RW and bringing RW all the way with her until seated;  unsteainess upon standing which improves quickly  Ambulation/Gait Ambulation/Gait assistance: Min guard Gait Distance (Feet): 125 Feet Assistive device: Rolling walker (2 wheeled) Gait Pattern/deviations: Step-through pattern;Decreased step length - right;Decreased step length - left;Decreased stride length Gait velocity: decreased overall, improved with distance   General Gait Details: unsteady upon standing, gained stability as she walked further, gait improved as she walked further   Stairs             Wheelchair Mobility    Modified Rankin (Stroke Patients Only)       Balance Overall balance assessment: Needs assistance Sitting-balance support: Bilateral upper extremity supported;Feet supported Sitting balance-Leahy Scale: Fair     Standing balance support: Bilateral upper extremity supported;During functional activity Standing balance-Leahy Scale: Fair Standing balance comment: fair with RW  Cognition Arousal/Alertness: Awake/alert Behavior During Therapy: WFL for tasks assessed/performed Overall Cognitive Status: Within Functional Limits for tasks assessed                                         Exercises General Exercises - Lower Extremity Ankle Circles/Pumps: AROM;Both;20 reps;Seated Long Arc Quad: AROM;Both;20 reps;Seated Toe Raises: AROM;Both;20 reps;Seated Heel Raises: AROM;Both;20 reps;Seated    General Comments        Pertinent Vitals/Pain Pain Assessment: 0-10 Pain Location: abdomen - post surgical Pain Descriptors / Indicators: Sore Pain Intervention(s): Limited activity within patient's tolerance;Monitored during session    Home Living                      Prior Function            PT Goals (current goals can now be found in the care plan section) Acute Rehab PT Goals Patient Stated Goal: Go home with daughter for now; HHPT and nursing for wound. PT Goal Formulation: With patient/family Time For Goal Achievement: 04/23/20 Potential to Achieve Goals: Good Progress towards PT goals: Progressing toward goals    Frequency    Min 3X/week      PT Plan Current plan remains appropriate    Co-evaluation              AM-PAC PT "6 Clicks" Mobility   Outcome Measure  Help needed turning from your back to your side while in a flat bed without using bedrails?: A Little Help needed moving from lying on your back to sitting on the side of a flat bed without using bedrails?: A Lot Help needed moving to and from a bed to a chair (including a wheelchair)?: A Little Help needed standing up from a chair using your arms (e.g., wheelchair or bedside chair)?: A Little Help needed to walk in hospital room?: A Little Help needed climbing 3-5 steps with a railing? : A Lot 6 Click Score: 16    End of Session Equipment Utilized During Treatment: Gait belt Activity Tolerance: Patient tolerated treatment well;Patient limited by fatigue Patient left: in chair;with chair alarm set;with family/visitor present;with call bell/phone within reach Nurse Communication: Mobility status PT Visit Diagnosis: Unsteadiness on feet  (R26.81);Muscle weakness (generalized) (M62.81);Other abnormalities of gait and mobility (R26.89)     Time: 5027-7412 PT Time Calculation (min) (ACUTE ONLY): 23 min  Charges:  $Therapeutic Exercise: 8-22 mins $Therapeutic Activity: 8-22 mins                     4:41 PM, 04/10/20 Mearl Latin PT, DPT Physical Therapist at [facility]

## 2020-04-10 NOTE — Progress Notes (Signed)
Patient ID: Gabrielle [name], female   DOB: 09-05-1946, 74 y.o.   MRN: [id number] 2 Days Post-Op  Subjective: Gabrielle [name] was seen this morning and reports feeling well. She says she has some minor abdominal pain on the right side that is helped significantly by tylenol. She has not had to use any IV pain medication. She reports having flatus, but no bowel movements yet.  She has been tolerating her diet well and has had no nausea or vomiting. She mentions she was able to ambulate yesterday with physical therapy and walk the hall with a walker without assistance. She also mentions being able to sit in the chair yesterday without discomfort. She denies any shortness of breath, chest pain, bloating, or significant pain. She was not on nasal cannula when I was in the room.   Objective: Vital signs in last 24 hours: Temp:  [98.5 F (36.9 C)] 98.5 F (36.9 C) (05/07 0518) Pulse Rate:  [67-68] 68 (05/07 0518) Resp:  [16] 16 (05/07 0518) BP: (132-142)/(50-58) 142/58 (05/07 0518) SpO2:  [89 %-100 %] 100 % (05/07 0518) Last BM Date: 04/08/20  Intake/Output from previous day: 05/06 0701 - 05/07 0700 In: 403.9 [P.O.:300; IV Piggyback:103.9] Out: 800 [Urine:800] Intake/Output this shift: No intake/output data recorded.  Physical Exam Cardiovascular:     Rate and Rhythm: Normal rate and regular rhythm.     Pulses: Normal pulses.     Heart sounds: Normal heart sounds.  Pulmonary:     Effort: Pulmonary effort is normal.     Breath sounds: Normal breath sounds.  Abdominal:     General: Abdomen is flat. Bowel sounds are decreased. There is no distension.     Palpations: Abdomen is soft.     Tenderness: There is abdominal tenderness in the right upper quadrant and right lower quadrant.     Comments: Some pain with deep palpation of the right side. No peritoneal signs.   Skin:    Comments: Incision site was clean and dry without discharge or erythema.   Neurological:     Mental Status: She is  alert and oriented to person, place, and time.    Lab Results:  Recent Labs    04/09/20 0459 04/10/20 0452  WBC 10.0 8.7  HGB 8.9* 8.3*  HCT 31.1* 28.4*  PLT 226 215   BMET Recent Labs    04/09/20 0459 04/10/20 0452  NA 138 137  K 3.5 4.3  CL 103 105  CO2 28 26  GLUCOSE 96 94  BUN 10 10  CREATININE 1.51* 1.27*  CALCIUM 8.2* 8.2*   PT/INR No results for input(s): LABPROT, INR in the last 72 hours.  Studies/Results: No results found.  Anti-infectives: Anti-infectives (From admission, onward)   Start     Dose/Rate Route Frequency Ordered Stop   04/08/20 2200  cefoTEtan (CEFOTAN) 2 g in sodium chloride 0.9 % 100 mL IVPB     2 g 200 mL/hr over 30 Minutes Intravenous Every 12 hours 04/08/20 1350 04/10/20 0741   04/08/20 0600  cefoTEtan (CEFOTAN) 2 g in sodium chloride 0.9 % 100 mL IVPB     2 g 200 mL/hr over 30 Minutes Intravenous On call to O.R. 04/07/20 1905 04/08/20 1041   04/07/20 2200  neomycin (MYCIFRADIN) tablet 1,000 mg     1,000 mg Oral  Once 04/06/20 1104 04/07/20 2226   04/07/20 2200  metroNIDAZOLE (FLAGYL) tablet 1,000 mg     1,000 mg Oral  Once 04/06/20 1104 04/07/20 2226  04/07/20 1500  neomycin (MYCIFRADIN) tablet 1,000 mg     1,000 mg Oral  Once 04/06/20 1104 04/07/20 1509   04/07/20 1500  metroNIDAZOLE (FLAGYL) tablet 1,000 mg     1,000 mg Oral  Once 04/06/20 1104 04/07/20 1509   04/07/20 1400  neomycin (MYCIFRADIN) tablet 1,000 mg     1,000 mg Oral  Once 04/06/20 1104 04/07/20 1318   04/07/20 1400  metroNIDAZOLE (FLAGYL) tablet 1,000 mg     1,000 mg Oral  Once 04/06/20 1104 04/07/20 1318   04/04/20 1930  cefTRIAXone (ROCEPHIN) 1 g in sodium chloride 0.9 % 100 mL IVPB     1 g 200 mL/hr over 30 Minutes Intravenous Every 24 hours 04/04/20 1919 04/06/20 2136      Assessment/Plan: s/p Procedure(s): PARTIAL COLECTOMY CYSTOSCOPY WITH BIOPSY Gabrielle [name] is a 74 year old female with recently diagnosed colon cancer on post op day 2. She is doing  well clinically and tolerating diet well. She has had flatus, but no bowel movements as of yet. Her pain is well controlled on oral medication and she has been able to ambulate with rolling walker without assistance. She does have low phosphorus today, which is likely due to dilution given significant fluid administration over past 2 days. Her CKD could also be a contributing factor.  - encourage continued ambulation  - will advance diet once patient has bowel movement  - continue Entereg on day 2 - replete phosphorus with Phos-NAK with meals and at bedtime   LOS: 5 days    Bartholome Bill 04/10/2020

## 2020-04-10 NOTE — TOC Progression Note (Signed)
Transition of Care [facility]) - Progression Note    Patient Details  Name: Gabrielle [name] MRN: [id number] Date of Birth: 08-03-1946  [facility] CM/SW Contact  Boneta Lucks, RN Phone Number: 04/10/2020, 1:28 PM  Clinical Narrative:   PT is recommending PT, MD is also ordering  RN/ Aide.  Georgina Snell with [facility] accepted the referral.  Added to AVS.     Expected Discharge Plan: [place] Barriers to Discharge: Continued Medical Work up  Expected Discharge Plan and Services Expected Discharge Plan: [place] arrangements for the past 2 months: [place]

## 2020-04-11 LAB — COMPREHENSIVE METABOLIC PANEL
ALT: 7 U/L (ref 0–44)
AST: 9 U/L — ABNORMAL LOW (ref 15–41)
Albumin: 2.2 g/dL — ABNORMAL LOW (ref 3.5–5.0)
Alkaline Phosphatase: 50 U/L (ref 38–126)
Anion gap: 6 (ref 5–15)
BUN: 9 mg/dL (ref 8–23)
CO2: 24 mmol/L (ref 22–32)
Calcium: 8.1 mg/dL — ABNORMAL LOW (ref 8.9–10.3)
Chloride: 106 mmol/L (ref 98–111)
Creatinine, Ser: 1.03 mg/dL — ABNORMAL HIGH (ref 0.44–1.00)
GFR calc Af Amer: >60 mL/min (ref >60)
GFR calc non Af Amer: 54 mL/min — ABNORMAL LOW (ref >60)
Glucose, Bld: 86 mg/dL (ref 70–99)
Potassium: 4.3 mmol/L (ref 3.5–5.1)
Sodium: 136 mmol/L (ref 135–145)
Total Bilirubin: 0.4 mg/dL (ref 0.3–1.2)
Total Protein: 5 g/dL — ABNORMAL LOW (ref 6.5–8.1)

## 2020-04-11 LAB — CBC WITH DIFFERENTIAL/PLATELET
Abs Immature Granulocytes: 0.03 10*3/uL (ref 0.00–0.07)
Basophils Absolute: 0 10*3/uL (ref 0.0–0.1)
Basophils Relative: 0 %
Eosinophils Absolute: 0.5 10*3/uL (ref 0.0–0.5)
Eosinophils Relative: 8 %
HCT: 28.8 % — ABNORMAL LOW (ref 36.0–46.0)
Hemoglobin: 8.4 g/dL — ABNORMAL LOW (ref 12.0–15.0)
Immature Granulocytes: 0 %
Lymphocytes Relative: 17 %
Lymphs Abs: 1.2 10*3/uL (ref 0.7–4.0)
MCH: 25.8 pg — ABNORMAL LOW (ref 26.0–34.0)
MCHC: 29.2 g/dL — ABNORMAL LOW (ref 30.0–36.0)
MCV: 88.3 fL (ref 80.0–100.0)
Monocytes Absolute: 0.6 10*3/uL (ref 0.1–1.0)
Monocytes Relative: 8 %
Neutro Abs: 4.4 10*3/uL (ref 1.7–7.7)
Neutrophils Relative %: 67 %
Platelets: 243 10*3/uL (ref 150–400)
RBC: 3.26 MIL/uL — ABNORMAL LOW (ref 3.87–5.11)
RDW: 17.9 % — ABNORMAL HIGH (ref 11.5–15.5)
WBC: 6.7 10*3/uL (ref 4.0–10.5)
nRBC: 0 % (ref 0.0–0.2)

## 2020-04-11 LAB — MAGNESIUM: Magnesium: 2 mg/dL (ref 1.7–2.4)

## 2020-04-11 LAB — PHOSPHORUS: Phosphorus: 1.9 mg/dL — ABNORMAL LOW (ref 2.5–4.6)

## 2020-04-11 MED ORDER — POTASSIUM CHLORIDE 20 MEQ PO PACK
20.0000 meq | PACK | Freq: Every day | ORAL | Status: DC
  Administered 2020-04-12: 11:00:00 20 meq via ORAL
  Filled 2020-04-11: qty 1

## 2020-04-11 MED ORDER — K PHOS MONO-SOD PHOS DI & MONO 155-852-130 MG PO TABS
500.0000 mg | ORAL_TABLET | Freq: Two times a day (BID) | ORAL | Status: DC
  Administered 2020-04-11 – 2020-04-12 (×2): 500 mg via ORAL
  Filled 2020-04-11 (×2): qty 2

## 2020-04-11 NOTE — Progress Notes (Signed)
[facility] Progress Note  3 Days Post-Op  Subjective: Overall improving. Getting up and moving and had 2  BM she reports. No nausea or vomiting.  PT recommended HH PT.   Objective: Vital signs in last 24 hours: Temp:  [98 F (36.7 C)-98.6 F (37 C)] 98 F (36.7 C) (05/08 0419) Pulse Rate:  [65-72] 72 (05/08 0419) Resp:  [16] 16 (05/08 0419) BP: (145-153)/(66-88) 152/66 (05/08 0419) SpO2:  [90 %-99 %] 97 % (05/08 0419) Last BM Date: 04/10/20  Intake/Output from previous day: 05/07 0701 - 05/08 0700 In: -  Out: 1200 [Urine:1200] Intake/Output this shift: Total I/O In: -  Out: 350 [Urine:350]  General appearance: alert, cooperative and no distress Resp: normal work of breathing GI: soft, nondistended, appropriately tender, honeycomb in place, staples c/d/i without erythema or drainage, binder  Lab Results:  Recent Labs    04/10/20 0452 04/11/20 0548  WBC 8.7 6.7  HGB 8.3* 8.4*  HCT 28.4* 28.8*  PLT 215 243   BMET Recent Labs    04/10/20 0452 04/11/20 0548  NA 137 136  K 4.3 4.3  CL 105 106  CO2 26 24  GLUCOSE 94 86  BUN 10 9  CREATININE 1.27* 1.03*  CALCIUM 8.2* 8.1*   Anti-infectives: Anti-infectives (From admission, onward)   Start     Dose/Rate Route Frequency Ordered Stop   04/08/20 2200  cefoTEtan (CEFOTAN) 2 g in sodium chloride 0.9 % 100 mL IVPB     2 g 200 mL/hr over 30 Minutes Intravenous Every 12 hours 04/08/20 1350 04/10/20 0741   04/08/20 0600  cefoTEtan (CEFOTAN) 2 g in sodium chloride 0.9 % 100 mL IVPB     2 g 200 mL/hr over 30 Minutes Intravenous On call to O.R. 04/07/20 1905 04/08/20 1041   04/07/20 2200  neomycin (MYCIFRADIN) tablet 1,000 mg     1,000 mg Oral  Once 04/06/20 1104 04/07/20 2226   04/07/20 2200  metroNIDAZOLE (FLAGYL) tablet 1,000 mg     1,000 mg Oral  Once 04/06/20 1104 04/07/20 2226   04/07/20 1500  neomycin (MYCIFRADIN) tablet 1,000 mg     1,000 mg Oral  Once 04/06/20 1104 04/07/20 1509   04/07/20  1500  metroNIDAZOLE (FLAGYL) tablet 1,000 mg     1,000 mg Oral  Once 04/06/20 1104 04/07/20 1509   04/07/20 1400  neomycin (MYCIFRADIN) tablet 1,000 mg     1,000 mg Oral  Once 04/06/20 1104 04/07/20 1318   04/07/20 1400  metroNIDAZOLE (FLAGYL) tablet 1,000 mg     1,000 mg Oral  Once 04/06/20 1104 04/07/20 1318   04/04/20 1930  cefTRIAXone (ROCEPHIN) 1 g in sodium chloride 0.9 % 100 mL IVPB     1 g 200 mL/hr over 30 Minutes Intravenous Every 24 hours 04/04/20 1919 04/06/20 2136      Assessment/Plan: Ms. Even is a 74 yo s/p R hemicolectomy for cancer. Doing well and having BM. Tylenol scheduled and narcotics PRN for pain, not using much narcotic IS, OOB Working with [facility] today, colace BID, entereg stopped Lab work all reassuring SCDs, lovenox    LOS: 6 days    Virl Cagey 04/11/2020

## 2020-04-11 NOTE — Progress Notes (Signed)
PROGRESS NOTE  Gabrielle [name] HLK:[id number] DOB: 05/03/1946 DOA: 04/04/2020 PCP: Janora Norlander, DO   Brief History: 74 y.o.femalewith medical history significant forhypertension, CKD 3. Patient presented to the ED with complaints of generalized weakness over the past 1 to 2 weeks.She was evaluated by her primary care provider and her hemoglobin was low. Patient has problems with her vision, soshe is not sure if her stools have been black, but her daughter present at bedside confirms patient has been having black stools.She also reports black stools and overall symptoms started when she started taking her iron pills. Reports some mild right lower abdominal pain, with intermittent dysuria over the past week. No vomiting.She underwent colonoscopy on 04/05/2020 which revealed malignant tumor involving ascending colon and endoscopically appeared to be adenocarcinoma of the colon.General surgery was consulted. The patient underwent right hemicolectomy on 04/08/2020. CT also shows severe bladder wall thickening. The patient did develop hematuria. Urology was consulted. The patient underwent cystoscopy on 04/08/2020.  Assessment/Plan: Adenocarcinoma of the colon -Right hemicolectomy on 04/08/2020 -Postoperative management per Dr. Blake Divine -operative notedextensionof massinto the lateral/ posterior peritoneal lining/ side wall.  -diet advancement per surgery -increase activity-->PT eval, OOB-->HHPT -judicious pain control -one BM 04/10/20 afternoon and one BM am 04/11/20  Symptomatic anemia -Transfused 2 units PRBC during this admission -Colonoscopy as discussed above -Hgb stable since transfusion  Iron deficiency anemia -Ferrous sulfate started  Acute urinary retention/bladder wall thickening/hematuria -04/08/20--cystoscopy and bladder bx with fulgeration-->Diffuse erythema and nodularity worse at the dome -follow up biopsies -follow up office for  voiding trial  Pyuria -04/04/20 UA >50 WBC -cultures neg -d/c ceftriaxone  Hypokalemia/Hypophosphatemia -replete -check mag--1.9  Hyperlipidemia -Restarted statin   CKD stage IIIb -Baseline creatinine 1.1-1.5 -A.m. BMP  Essential hypertension -Holding losartan/HCTZ -BP remains controlled  Disposition Plan: Patient From: Home D/C Place: Home - 5/9 or 5/10 Barriers: Not Clinically Stable--awaiting return of bowel function and diet tolerance  Family Communication:Daughter updated 5/8  Consultants:General surgery/ urology/med onc  Code Status: FULL  DVT Prophylaxis: SCDs   Procedures: As Listed in Progress Note Above  Antibiotics: None   Subjective: Pt had another bm today.  She is passing flatus.  Denies f/c, cp, n/v, sob.  Abdominal pain controlled with tylenol  Objective: Vitals:   04/10/20 2029 04/10/20 2142 04/11/20 0419 04/11/20 1341  BP:  (!) 145/66 (!) 152/66 (!) 143/49  Pulse: 65 66 72 71  Resp: 16 16 16 16   Temp:  98.4 F (36.9 C) 98 F (36.7 C) 98 F (36.7 C)  TempSrc:  Oral Oral   SpO2: 90% 99% 97% 100%  Weight:      Height:        Intake/Output Summary (Last 24 hours) at 04/11/2020 1713 Last data filed at 04/11/2020 1458 Gross per 24 hour  Intake --  Output 2250 ml  Net -2250 ml   Weight change:  Exam:   General:  Pt is alert, follows commands appropriately, not in acute distress  HEENT: No icterus, No thrush, No neck mass, [place]/AT  Cardiovascular: RRR, S1/S2, no rubs, no gallops  Respiratory: CTA bilaterally, no wheezing, no crackles, no rhonchi  Abdomen: Soft/+BS, non tender, non distended, no guarding  Extremities: No edema, No lymphangitis, No petechiae, No rashes, no synovitis   Data Reviewed: I have personally reviewed following labs and imaging studies Basic Metabolic Panel: Recent Labs  Lab 04/07/20 0458 04/08/20 0433 04/09/20 0459 04/10/20 0452 04/11/20 0548  NA  137 135 138 137 136  K 3.5  3.0* 3.5 4.3 4.3  CL 106 106 103 105 106  CO2 23 24 28 26 24   GLUCOSE 104* 117* 96 94 86  BUN 13 11 10 10 9   CREATININE 1.19* 1.23* 1.51* 1.27* 1.03*  CALCIUM 8.2* 8.2* 8.2* 8.2* 8.1*  MG 2.0  --  1.8 2.2 2.0  PHOS  --   --  4.2 2.1* 1.9*   Liver Function Tests: Recent Labs  Lab 04/07/20 0458 04/08/20 0433 04/09/20 0459 04/10/20 0452 04/11/20 0548  AST 7* 10* 9* 9* 9*  ALT 5 5 5 5 7   ALKPHOS 56 54 49 47 50  BILITOT 0.6 0.5 0.3 0.3 0.4  PROT 5.6* 5.8* 5.2* 5.1* 5.0*  ALBUMIN 2.7* 2.7* 2.3* 2.2* 2.2*   No results for input(s): LIPASE, AMYLASE in the last 168 hours. No results for input(s): AMMONIA in the last 168 hours. Coagulation Profile: No results for input(s): INR, PROTIME in the last 168 hours. CBC: Recent Labs  Lab 04/06/20 0522 04/07/20 0458 04/09/20 0459 04/10/20 0452 04/11/20 0548  WBC 8.5 7.9 10.0 8.7 6.7  NEUTROABS  --  5.7 7.1 6.5 4.4  HGB 9.5* 9.5* 8.9* 8.3* 8.4*  HCT 30.7* 31.4* 31.1* 28.4* 28.8*  MCV 85.5 87.5 91.7 88.2 88.3  PLT 241 245 226 215 243   Cardiac Enzymes: No results for input(s): CKTOTAL, CKMB, CKMBINDEX, TROPONINI in the last 168 hours. BNP: Invalid input(s): POCBNP CBG: Recent Labs  Lab 04/05/20 2113 04/06/20 1104 04/06/20 1626  GLUCAP 85 85 90   HbA1C: No results for input(s): HGBA1C in the last 72 hours. Urine analysis:    Component Value Date/Time   COLORURINE YELLOW 04/04/2020 1758   APPEARANCEUR CLEAR 04/04/2020 1758   LABSPEC 1.008 04/04/2020 1758   PHURINE 8.0 04/04/2020 1758   GLUCOSEU NEGATIVE 04/04/2020 1758   HGBUR NEGATIVE 04/04/2020 1758   BILIRUBINUR NEGATIVE 04/04/2020 1758   KETONESUR NEGATIVE 04/04/2020 1758   PROTEINUR NEGATIVE 04/04/2020 1758   NITRITE NEGATIVE 04/04/2020 1758   LEUKOCYTESUR LARGE (A) 04/04/2020 1758   Sepsis Labs: @LABRCNTIP (procalcitonin:4,lacticidven:4) ) Recent Results (from the past 240 hour(s))  Respiratory Panel by RT PCR (Flu A&B, Covid) - Nasopharyngeal Swab      Status: None   Collection Time: 04/04/20  3:37 PM   Specimen: Nasopharyngeal Swab  Result Value Ref Range Status   SARS Coronavirus 2 by RT PCR NEGATIVE NEGATIVE Final    Comment: (NOTE) SARS-CoV-2 target nucleic acids are NOT DETECTED. The SARS-CoV-2 RNA is generally detectable in upper respiratoy specimens during the acute phase of infection. The lowest concentration of SARS-CoV-2 viral copies this assay can detect is 131 copies/mL. A negative result does not preclude SARS-Cov-2 infection and should not be used as the sole basis for treatment or other patient management decisions. A negative result may occur with  improper specimen collection/handling, submission of specimen other than nasopharyngeal swab, presence of viral mutation(s) within the areas targeted by this assay, and inadequate number of viral copies (<131 copies/mL). A negative result must be combined with clinical observations, patient history, and epidemiological information. The expected result is Negative. Fact Sheet for Patients:  PinkCheek.be Fact Sheet for Healthcare Providers:  GravelBags.it This test is not yet ap proved or cleared by the [place] FDA and  has been authorized for detection and/or diagnosis of SARS-CoV-2 by FDA under an Emergency Use Authorization (EUA). This EUA will remain  in effect (meaning this test can be used) for the  duration of the COVID-19 declaration under Section 564(b)(1) of the Act, 21 U.S.C. section 360bbb-3(b)(1), unless the authorization is terminated or revoked sooner.    Influenza A by PCR NEGATIVE NEGATIVE Final   Influenza B by PCR NEGATIVE NEGATIVE Final    Comment: (NOTE) The Xpert Xpress SARS-CoV-2/FLU/RSV assay is intended as an aid in  the diagnosis of influenza from Nasopharyngeal swab specimens and  should not be used as a sole basis for treatment. Nasal washings and  aspirates are unacceptable for  Xpert Xpress SARS-CoV-2/FLU/RSV  testing. Fact Sheet for Patients: PinkCheek.be Fact Sheet for Healthcare Providers: GravelBags.it This test is not yet approved or cleared by the [place] FDA and  has been authorized for detection and/or diagnosis of SARS-CoV-2 by  FDA under an Emergency Use Authorization (EUA). This EUA will remain  in effect (meaning this test can be used) for the duration of the  Covid-19 declaration under Section 564(b)(1) of the Act, 21  U.S.C. section 360bbb-3(b)(1), unless the authorization is  terminated or revoked. Performed at [facility], [address], [place], [place] [address]   Urine culture     Status: None   Collection Time: 04/04/20  5:59 PM   Specimen: Urine, Clean Catch  Result Value Ref Range Status   Specimen Description   Final    URINE, CLEAN CATCH Performed at [facility], [address], [place], [place] [address]    Special Requests   Final    NONE Performed at [facility], [address], [place], [place] [address]    Culture   Final    NO GROWTH Performed at [facility], [place] [address], [place], [place] [address]    Report Status 04/06/2020 FINAL  Final  Surgical pcr screen     Status: None   Collection Time: 04/07/20  7:59 PM   Specimen: Nasal Mucosa; Nasal Swab  Result Value Ref Range Status   MRSA, PCR NEGATIVE NEGATIVE Final   Staphylococcus aureus NEGATIVE NEGATIVE Final    Comment: (NOTE) The Xpert SA Assay (FDA approved for NASAL specimens in patients 71 years of age and older), is one component of a comprehensive surveillance program. It is not intended to diagnose infection nor to guide or monitor treatment. Performed at [facility], [address], [place], [place] [address]      Scheduled Meds: . acetaminophen  1,000 mg Oral Q6H  . atorvastatin  20 mg Oral Daily  . Chlorhexidine Gluconate Cloth  6 each Topical Daily  . docusate sodium  100  mg Oral BID  . dorzolamide-timolol  1 drop Both Eyes Daily  . enoxaparin (LOVENOX) injection  40 mg Subcutaneous Q24H  . ferrous sulfate  325 mg Oral BID WC  . gabapentin  300 mg Oral BID  . pantoprazole  40 mg Oral Daily  . phosphorus  500 mg Oral BID  . [START ON 04/12/2020] potassium chloride  20 mEq Oral Daily  . sodium chloride flush  3 mL Intravenous Q12H   Continuous Infusions: . sodium chloride    . lactated ringers 50 mL/hr at 04/10/20 2316    Procedures/Studies: CT ABDOMEN PELVIS W CONTRAST  Result Date: 04/06/2020 CLINICAL DATA:  Ascending colon mass seen on colonoscopy today. EXAM: CT ABDOMEN AND PELVIS WITH CONTRAST TECHNIQUE: Multidetector CT imaging of the abdomen and pelvis was performed using the standard protocol following bolus administration of intravenous contrast. CONTRAST:  48mL OMNIPAQUE IOHEXOL 300 MG/ML  SOLN COMPARISON:  None. FINDINGS: Lower chest: The  lung bases are clear of an acute process. No infiltrates or effusions. No worrisome pulmonary nodules. The heart is within normal limits in size for age. No pericardial effusion. Mitral valve annular calcifications are noted. Moderate aortic calcifications. The distal esophagus is grossly normal Hepatobiliary: No hepatic lesions are identified to suggest hepatic metastatic disease. The gallbladder is normal. No intra or extrahepatic biliary dilatation. Pancreas: No mass, inflammation or ductal dilatation. Spleen: Normal size. No focal lesions. Adrenals/Urinary Tract: The adrenal glands and kidneys are unremarkable. No worrisome renal lesions or hydronephrosis. Small renal cysts are noted. Severely thick-walled bladder which contains a Foley catheter. Findings could be due to severe chronic cystitis. I do not see a discrete mass but could not exclude the possibility of infiltrating tumor. Patient may need cystoscopic evaluation unless this is a known entity. Stomach/Bowel: The stomach, duodenum and small bowel are  unremarkable. No acute inflammatory changes, mass lesions or obstructive findings. The terminal ileum is normal. The appendix is normal. Fairly extensive circumferential ascending colon mass extending from the ileocecal valve to the hepatic flexure and measuring approximately 6 cm in length. No obstruction. Contrast moves through this area and to the distal colon. There are moderate surrounding interstitial changes and could not exclude direct invasion of the pericolonic fat. There also numerous borderline enlarged pericolonic lymph nodes which are somewhat worrisome for tumor involvement. Vascular/Lymphatic: Advanced atherosclerotic calcifications involving the aorta and iliac arteries. No enlarged retroperitoneal lymph nodes or upper abdominal lymph nodes. Reproductive: The uterus and ovaries are unremarkable. Other: Small amount of free pelvic fluid is noted. No inguinal mass or adenopathy. Musculoskeletal: No significant bony findings. IMPRESSION: 1. 6 cm circumferential ascending colon mass extending from the ileocecal valve to the hepatic flexure and possibly invading the pericolonic fat. Numerous borderline enlarged pericolonic lymph nodes are somewhat worrisome. 2. No findings for hepatic metastatic disease. 3. Severely thick-walled bladder which contains a Foley catheter. Findings could be due to severe chronic cystitis. Underlying infiltrating tumor is possible. Recommend cystoscopic evaluation unless this is a known entity. 4. Advanced atherosclerotic calcifications involving the aorta and iliac arteries. Aortic Atherosclerosis (ICD10-I70.0). Electronically Signed   By: Marijo Sanes M.D.   On: 04/06/2020 11:56   ECHOCARDIOGRAM COMPLETE  Result Date: 04/07/2020    ECHOCARDIOGRAM REPORT   Patient Name:   YOSHIKA VENSEL Date of Exam: 04/07/2020 Medical Rec #:  [id number]       Height:       62.0 in Accession #:    [id number]      Weight:       150.0 lb Date of Birth:  [birth year]      BSA:          1.692 m  Patient Age:    26 years        BP:           123/66 mmHg Patient Gender: F               HR:           72 bpm. Exam Location:  [facility] Procedure: 2D Echo Indications:    Preoperative evaluation  History:        Patient has no prior history of Echocardiogram examinations.                 Risk Factors:Hypertension, Dyslipidemia and Non-Smoker. Stage 3                 chronic kidney disease, Macular degeneration.  Sonographer:  Leavy Cella RDCS (AE) Referring Phys: [place]  1. Left ventricular ejection fraction, by estimation, is 65 to 70%. The left ventricle has normal function. The left ventricle has no regional wall motion abnormalities. There is mild left ventricular hypertrophy. Left ventricular diastolic parameters are consistent with Grade I diastolic dysfunction (impaired relaxation).  2. Right ventricular systolic function is normal. The right ventricular size is normal. Tricuspid regurgitation signal is inadequate for assessing PA pressure.  3. Left atrial size was moderately dilated.  4. The mitral valve is grossly normal. Trivial mitral valve regurgitation.  5. The aortic valve is tricuspid. Aortic valve regurgitation is trivial. Mild aortic valve sclerosis is present, with no evidence of aortic valve stenosis.  6. The inferior vena cava is normal in size with greater than 50% respiratory variability, suggesting right atrial pressure of 3 mmHg. FINDINGS  Left Ventricle: Left ventricular ejection fraction, by estimation, is 65 to 70%. The left ventricle has normal function. The left ventricle has no regional wall motion abnormalities. The left ventricular internal cavity size was normal in size. There is  mild left ventricular hypertrophy. Left ventricular diastolic parameters are consistent with Grade I diastolic dysfunction (impaired relaxation). Right Ventricle: The right ventricular size is normal. No increase in right ventricular wall thickness. Right ventricular  systolic function is normal. Tricuspid regurgitation signal is inadequate for assessing PA pressure. Left Atrium: Left atrial size was moderately dilated. Right Atrium: Right atrial size was normal in size. Pericardium: There is no evidence of pericardial effusion. Presence of pericardial fat pad. Mitral Valve: The mitral valve is grossly normal. Mild mitral annular calcification. Trivial mitral valve regurgitation. Tricuspid Valve: The tricuspid valve is grossly normal. Tricuspid valve regurgitation is trivial. Aortic Valve: The aortic valve is tricuspid. Aortic valve regurgitation is trivial. Mild aortic valve sclerosis is present, with no evidence of aortic valve stenosis. Mild to moderate aortic valve annular calcification. Pulmonic Valve: The pulmonic valve was grossly normal. Pulmonic valve regurgitation is trivial. Aorta: The aortic root is normal in size and structure. Venous: The inferior vena cava is normal in size with greater than 50% respiratory variability, suggesting right atrial pressure of 3 mmHg. IAS/Shunts: No atrial level shunt detected by color flow Doppler. Rozann Lesches MD Electronically signed by Rozann Lesches MD Signature Date/Time: 04/07/2020/10:04:33 AM    Final     Orson Eva, DO  [facility]  If 7PM-7AM, please contact night-coverage [email or web address] Password [email or web address] 04/11/2020, 5:13 PM   LOS: 6 days

## 2020-04-12 LAB — CBC WITH DIFFERENTIAL/PLATELET
Abs Immature Granulocytes: 0.03 10*3/uL (ref 0.00–0.07)
Basophils Absolute: 0 10*3/uL (ref 0.0–0.1)
Basophils Relative: 0 %
Eosinophils Absolute: 0.4 10*3/uL (ref 0.0–0.5)
Eosinophils Relative: 7 %
HCT: 25.4 % — ABNORMAL LOW (ref 36.0–46.0)
Hemoglobin: 7.5 g/dL — ABNORMAL LOW (ref 12.0–15.0)
Immature Granulocytes: 1 %
Lymphocytes Relative: 22 %
Lymphs Abs: 1.2 10*3/uL (ref 0.7–4.0)
MCH: 26.1 pg (ref 26.0–34.0)
MCHC: 29.5 g/dL — ABNORMAL LOW (ref 30.0–36.0)
MCV: 88.5 fL (ref 80.0–100.0)
Monocytes Absolute: 0.4 10*3/uL (ref 0.1–1.0)
Monocytes Relative: 8 %
Neutro Abs: 3.3 10*3/uL (ref 1.7–7.7)
Neutrophils Relative %: 62 %
Platelets: 213 10*3/uL (ref 150–400)
RBC: 2.87 MIL/uL — ABNORMAL LOW (ref 3.87–5.11)
RDW: 18 % — ABNORMAL HIGH (ref 11.5–15.5)
WBC: 5.3 10*3/uL (ref 4.0–10.5)
nRBC: 0 % (ref 0.0–0.2)

## 2020-04-12 LAB — BASIC METABOLIC PANEL
Anion gap: 6 (ref 5–15)
BUN: 11 mg/dL (ref 8–23)
CO2: 24 mmol/L (ref 22–32)
Calcium: 7.8 mg/dL — ABNORMAL LOW (ref 8.9–10.3)
Chloride: 108 mmol/L (ref 98–111)
Creatinine, Ser: 0.93 mg/dL (ref 0.44–1.00)
GFR calc Af Amer: >60 mL/min (ref >60)
GFR calc non Af Amer: >60 mL/min (ref >60)
Glucose, Bld: 92 mg/dL (ref 70–99)
Potassium: 4.3 mmol/L (ref 3.5–5.1)
Sodium: 138 mmol/L (ref 135–145)

## 2020-04-12 LAB — PHOSPHORUS: Phosphorus: 2.9 mg/dL (ref 2.5–4.6)

## 2020-04-12 LAB — MAGNESIUM: Magnesium: 1.9 mg/dL (ref 1.7–2.4)

## 2020-04-12 NOTE — Progress Notes (Signed)
Two IVs removed from left forearm, patient tolerated well. Reviewed AVS with patient and patient's daughter, both verbalized understanding.  Reviewed foley catheter care with patient's daughter Moreen Piggott, who verbalized understanding.  Patient transported to front lobby via wheelchair and transported home by her daughter, Jonae Renshaw.

## 2020-04-12 NOTE — Progress Notes (Signed)
[facility] Progress Note  4 Days Post-Op  Subjective: Looking good. Having BMs. Says they are a little loose. No dark stools reported or bleeding.   Objective: Vital signs in last 24 hours: Temp:  [97.5 F (36.4 C)-98 F (36.7 C)] 97.5 F (36.4 C) (05/09 0549) Pulse Rate:  [69-72] 72 (05/09 0549) Resp:  [16] 16 (05/09 0549) BP: (153-161)/(65) 153/65 (05/09 0549) SpO2:  [98 %] 98 % (05/09 0549) Last BM Date: 04/12/20  Intake/Output from previous day: 05/08 0701 - 05/09 0700 In: -  Out: 1550 [Urine:1550] Intake/Output this shift: Total I/O In: -  Out: 800 [Urine:800]  General appearance: alert, cooperative and no distress Resp: normal work of breathing GI: soft, nondistended, appropriately tender, staples c/d/i without erythema or drainage  Lab Results:  Recent Labs    04/11/20 0548 04/12/20 0649  WBC 6.7 5.3  HGB 8.4* 7.5*  HCT 28.8* 25.4*  PLT 243 213   BMET Recent Labs    04/11/20 0548 04/12/20 0649  NA 136 138  K 4.3 4.3  CL 106 108  CO2 24 24  GLUCOSE 86 92  BUN 9 11  CREATININE 1.03* 0.93  CALCIUM 8.1* 7.8*   Anti-infectives: Anti-infectives (From admission, onward)   Start     Dose/Rate Route Frequency Ordered Stop   04/08/20 2200  cefoTEtan (CEFOTAN) 2 g in sodium chloride 0.9 % 100 mL IVPB     2 g 200 mL/hr over 30 Minutes Intravenous Every 12 hours 04/08/20 1350 04/10/20 0741   04/08/20 0600  cefoTEtan (CEFOTAN) 2 g in sodium chloride 0.9 % 100 mL IVPB     2 g 200 mL/hr over 30 Minutes Intravenous On call to O.R. 04/07/20 1905 04/08/20 1041   04/07/20 2200  neomycin (MYCIFRADIN) tablet 1,000 mg     1,000 mg Oral  Once 04/06/20 1104 04/07/20 2226   04/07/20 2200  metroNIDAZOLE (FLAGYL) tablet 1,000 mg     1,000 mg Oral  Once 04/06/20 1104 04/07/20 2226   04/07/20 1500  neomycin (MYCIFRADIN) tablet 1,000 mg     1,000 mg Oral  Once 04/06/20 1104 04/07/20 1509   04/07/20 1500  metroNIDAZOLE (FLAGYL) tablet 1,000 mg     1,000 mg Oral  Once 04/06/20 1104 04/07/20 1509   04/07/20 1400  neomycin (MYCIFRADIN) tablet 1,000 mg     1,000 mg Oral  Once 04/06/20 1104 04/07/20 1318   04/07/20 1400  metroNIDAZOLE (FLAGYL) tablet 1,000 mg     1,000 mg Oral  Once 04/06/20 1104 04/07/20 1318   04/04/20 1930  cefTRIAXone (ROCEPHIN) 1 g in sodium chloride 0.9 % 100 mL IVPB     1 g 200 mL/hr over 30 Minutes Intravenous Every 24 hours 04/04/20 1919 04/06/20 2136      Assessment/Plan: Gabrielle [name] is a 74 yo s/p R hemicolectomy for cancer. Doing well and having BM. Overall improving. H&H drifted but no signs of bleeding, like dilution and equilibration. Continue diet, Colace as needed Will take staples out 5/18 Discussed with Dr. Carles Collet to get her home. Pathology pending.    LOS: 7 days    Virl Cagey 04/12/2020

## 2020-04-12 NOTE — Discharge Summary (Signed)
Physician Discharge Summary  Gabrielle [name] OVF:[id number] DOB: [birth year] DOA: 04/04/2020  PCP: Janora Norlander, DO  Admit date: 04/04/2020 Discharge date: 04/12/2020  Admitted From: Home Disposition:  Home   Recommendations for Outpatient Follow-up:  1. Follow up with PCP in 1-2 weeks 2. Please obtain BMP/CBC in one week 3. Follow up with med/onc, general surgery, urology as seen below   Home Health: YES Equipment/Devices:HHPT, [facility]  Discharge Condition: Stable CODE STATUS: FULL Diet recommendation: Heart Healthy   Brief/Interim Summary: 74 y.o.femalewith medical history significant forhypertension, CKD 3. Patient presented to the ED with complaints of generalized weakness over the past 1 to 2 weeks.She was evaluated by her primary care provider and her hemoglobin was low. Patient has problems with her vision, soshe is not sure if her stools have been black, but her daughter present at bedside confirms patient has been having black stools.She also reports black stools and overall symptoms started when she started taking her iron pills. Reports some mild right lower abdominal pain, with intermittent dysuria over the past week. No vomiting.She underwent colonoscopy on 04/05/2020 which revealed malignant tumor involving ascending colon and endoscopically appeared to be adenocarcinoma of the colon.General surgery was consulted. The patient underwent right hemicolectomy on 04/08/2020. CT also shows severe bladder wall thickening. The patient did develop hematuria. Urology was consulted. The patient underwent cystoscopy on 04/08/2020 at the same time as her right hemicolectomy.  The bladder was noted to have diffuse erythema and nodularity.  The pathology from the biopsies are pending at the time of discharge.  Dr. Alyson Ingles wanted the patient to go home with a Foley catheter and follow-up in the office for voiding trial. Her postoperative care was directed by general surgery.   The patient was initially placed on Entereg.  Her bowel function gradually returned and the patient had multiple bowel movements.  Her diet was gradually advanced which she tolerated.  She will follow up with urology and general surgery after his discharge  Discharge Diagnoses:  Adenocarcinoma of the colon -Right hemicolectomy on 04/08/2020 -04/05/20 colonscopy biopsy = adenocarcinoma -Postoperative management per Dr. Blake Divine -operative notedextensionof massinto the lateral/ posterior peritoneal lining/ side wall.  -diet advancement per surgery--tolerated cardiac diet -increase activity-->PT eval, OOB-->HHPT -judicious pain control -one BM 04/10/20 afternoon and one BM am 04/11/20 -5/9--discussed with Dr. Constance Haw ok for d/c -await lymph node pathology -pt was seen by med/onc, Dr. Irving Copas up office after pathology returns from surgery  Symptomatic anemia -Transfused 2 units PRBC during this admission -Colonoscopy as discussed above -Hgb stable since transfusion  Iron deficiency anemia -Ferrous sulfate started  Acute urinary retention/bladder wall thickening/hematuria -04/08/20--cystoscopy and bladder bx with fulgeration-->Diffuse erythema and nodularity worse at the dome -pt going home with foley -follow up biopsies -follow up office for voiding trial  Pyuria -04/04/20 UA >50 WBC -cultures neg -d/c ceftriaxone  Hypokalemia/Hypophosphatemia -repleted -check mag--1.9  Hyperlipidemia -Restarted statin   CKD stage IIIb -Baseline creatinine 1.1-1.5 -A.m. BMP  Essential hypertension -Holding losartan/HCTZ--restart after d/c   Discharge Instructions   Allergies as of 04/12/2020      Reactions   Lisinopril Cough   Pt states she is not allergic to lisinopril      Medication List    TAKE these medications   alendronate 70 MG tablet Commonly known as: FOSAMAX Take 1 tablet (70 mg total) by mouth every 7 (seven) days. Take with a full glass of  water on an empty stomach.   atorvastatin 20 MG tablet Commonly known  as: LIPITOR Take 1 tablet (20 mg total) by mouth daily.   cholecalciferol 25 MCG (1000 UNIT) tablet Commonly known as: VITAMIN D3 Take 1,000 Units by mouth daily.   diclofenac Sodium 1 % Gel Commonly known as: VOLTAREN Apply 4 g topically 4 (four) times daily.   dorzolamide-timolol 22.3-6.8 MG/ML ophthalmic solution Commonly known as: COSOPT Place 1 drop into both eyes daily.   ferrous sulfate 325 (65 FE) MG EC tablet Take 1 tablet (325 mg total) by mouth 2 (two) times daily with a meal.   losartan-hydrochlorothiazide 100-25 MG tablet Commonly known as: HYZAAR TAKE 1 TABLET DAILY.  Needs to be seen for further refills.      Follow-up Information    [facility] Follow up.   Why:  PT/ RN/ AIDe       Cleon Gustin, MD Follow up in 1 week(s).   Specialty: Urology Contact information: [address] [place] [address] [phone]        Virl Cagey, MD Follow up in 2 week(s).   Specialty: General Surgery Contact information: [address] [place] [facility] [address] [phone]        Derek Jack, MD Follow up in 2 week(s).   Specialty: Hematology Contact information: [place] [address] [phone]          Allergies  Allergen Reactions  . Lisinopril Cough    Pt states she is not allergic to lisinopril    Consultations:  General surgery  Med onc  GI  urology   Procedures/Studies: CT ABDOMEN PELVIS W CONTRAST  Result Date: 04/06/2020 CLINICAL DATA:  Ascending colon mass seen on colonoscopy today. EXAM: CT ABDOMEN AND PELVIS WITH CONTRAST TECHNIQUE: Multidetector CT imaging of the abdomen and pelvis was performed using the standard protocol following bolus administration of intravenous contrast. CONTRAST:  74mL OMNIPAQUE IOHEXOL 300 MG/ML  SOLN COMPARISON:  None. FINDINGS: Lower chest: The lung bases are clear of an  acute process. No infiltrates or effusions. No worrisome pulmonary nodules. The heart is within normal limits in size for age. No pericardial effusion. Mitral valve annular calcifications are noted. Moderate aortic calcifications. The distal esophagus is grossly normal Hepatobiliary: No hepatic lesions are identified to suggest hepatic metastatic disease. The gallbladder is normal. No intra or extrahepatic biliary dilatation. Pancreas: No mass, inflammation or ductal dilatation. Spleen: Normal size. No focal lesions. Adrenals/Urinary Tract: The adrenal glands and kidneys are unremarkable. No worrisome renal lesions or hydronephrosis. Small renal cysts are noted. Severely thick-walled bladder which contains a Foley catheter. Findings could be due to severe chronic cystitis. I do not see a discrete mass but could not exclude the possibility of infiltrating tumor. Patient may need cystoscopic evaluation unless this is a known entity. Stomach/Bowel: The stomach, duodenum and small bowel are unremarkable. No acute inflammatory changes, mass lesions or obstructive findings. The terminal ileum is normal. The appendix is normal. Fairly extensive circumferential ascending colon mass extending from the ileocecal valve to the hepatic flexure and measuring approximately 6 cm in length. No obstruction. Contrast moves through this area and to the distal colon. There are moderate surrounding interstitial changes and could not exclude direct invasion of the pericolonic fat. There also numerous borderline enlarged pericolonic lymph nodes which are somewhat worrisome for tumor involvement. Vascular/Lymphatic: Advanced atherosclerotic calcifications involving the aorta and iliac arteries. No enlarged retroperitoneal lymph nodes or upper abdominal lymph nodes. Reproductive: The uterus and ovaries are unremarkable. Other: Small amount of free  pelvic fluid is noted. No inguinal mass or adenopathy. Musculoskeletal: No significant bony  findings. IMPRESSION: 1. 6 cm circumferential ascending colon mass extending from the ileocecal valve to the hepatic flexure and possibly invading the pericolonic fat. Numerous borderline enlarged pericolonic lymph nodes are somewhat worrisome. 2. No findings for hepatic metastatic disease. 3. Severely thick-walled bladder which contains a Foley catheter. Findings could be due to severe chronic cystitis. Underlying infiltrating tumor is possible. Recommend cystoscopic evaluation unless this is a known entity. 4. Advanced atherosclerotic calcifications involving the aorta and iliac arteries. Aortic Atherosclerosis (ICD10-I70.0). Electronically Signed   By: Marijo Sanes M.D.   On: 04/06/2020 11:56   ECHOCARDIOGRAM COMPLETE  Result Date: 04/07/2020    ECHOCARDIOGRAM REPORT   Patient Name:   Gabrielle [name] Date of Exam: 04/07/2020 Medical Rec #:  [id number]       Height:       62.0 in Accession #:    [id number]      Weight:       150.0 lb Date of Birth:  1946/05/10      BSA:          1.692 m Patient Age:    79 years        BP:           123/66 mmHg Patient Gender: F               HR:           72 bpm. Exam Location:  [facility] Procedure: 2D Echo Indications:    Preoperative evaluation  History:        Patient has no prior history of Echocardiogram examinations.                 Risk Factors:Hypertension, Dyslipidemia and Non-Smoker. Stage 3                 chronic kidney disease, Macular degeneration.  Sonographer:    Leavy Cella RDCS (AE) Referring Phys: [place]  1. Left ventricular ejection fraction, by estimation, is 65 to 70%. The left ventricle has normal function. The left ventricle has no regional wall motion abnormalities. There is mild left ventricular hypertrophy. Left ventricular diastolic parameters are consistent with Grade I diastolic dysfunction (impaired relaxation).  2. Right ventricular systolic function is normal. The right ventricular size is normal. Tricuspid  regurgitation signal is inadequate for assessing PA pressure.  3. Left atrial size was moderately dilated.  4. The mitral valve is grossly normal. Trivial mitral valve regurgitation.  5. The aortic valve is tricuspid. Aortic valve regurgitation is trivial. Mild aortic valve sclerosis is present, with no evidence of aortic valve stenosis.  6. The inferior vena cava is normal in size with greater than 50% respiratory variability, suggesting right atrial pressure of 3 mmHg. FINDINGS  Left Ventricle: Left ventricular ejection fraction, by estimation, is 65 to 70%. The left ventricle has normal function. The left ventricle has no regional wall motion abnormalities. The left ventricular internal cavity size was normal in size. There is  mild left ventricular hypertrophy. Left ventricular diastolic parameters are consistent with Grade I diastolic dysfunction (impaired relaxation). Right Ventricle: The right ventricular size is normal. No increase in right ventricular wall thickness. Right ventricular systolic function is normal. Tricuspid regurgitation signal is inadequate for assessing PA pressure. Left Atrium: Left atrial size was moderately dilated. Right Atrium: Right atrial size was normal in size. Pericardium: There is no evidence of pericardial effusion.  Presence of pericardial fat pad. Mitral Valve: The mitral valve is grossly normal. Mild mitral annular calcification. Trivial mitral valve regurgitation. Tricuspid Valve: The tricuspid valve is grossly normal. Tricuspid valve regurgitation is trivial. Aortic Valve: The aortic valve is tricuspid. Aortic valve regurgitation is trivial. Mild aortic valve sclerosis is present, with no evidence of aortic valve stenosis. Mild to moderate aortic valve annular calcification. Pulmonic Valve: The pulmonic valve was grossly normal. Pulmonic valve regurgitation is trivial. Aorta: The aortic root is normal in size and structure. Venous: The inferior vena cava is normal in size  with greater than 50% respiratory variability, suggesting right atrial pressure of 3 mmHg. IAS/Shunts: No atrial level shunt detected by color flow Doppler. Rozann Lesches MD Electronically signed by Rozann Lesches MD Signature Date/Time: 04/07/2020/10:04:33 AM    Final         Discharge Exam: Vitals:   04/11/20 2202 04/12/20 0549  BP: (!) 161/65 (!) 153/65  Pulse: 69 72  Resp: 16 16  Temp: 98 F (36.7 C) (!) 97.5 F (36.4 C)  SpO2: 98% 98%   Vitals:   04/11/20 0419 04/11/20 1341 04/11/20 2202 04/12/20 0549  BP: (!) 152/66 (!) 143/49 (!) 161/65 (!) 153/65  Pulse: 72 71 69 72  Resp: 16 16 16 16   Temp: 98 F (36.7 C) 98 F (36.7 C) 98 F (36.7 C) (!) 97.5 F (36.4 C)  TempSrc: Oral  Oral Oral  SpO2: 97% 100% 98% 98%  Weight:      Height:        General: Pt is alert, awake, not in acute distress Cardiovascular: RRR, S1/S2 +, no rubs, no gallops Respiratory: bibasilar crackles. No wheeze Abdominal: Soft, NT, ND, bowel sounds + Extremities: no edema, no cyanosis   The results of significant diagnostics from this hospitalization (including imaging, microbiology, ancillary and laboratory) are listed below for reference.    Significant Diagnostic Studies: CT ABDOMEN PELVIS W CONTRAST  Result Date: 04/06/2020 CLINICAL DATA:  Ascending colon mass seen on colonoscopy today. EXAM: CT ABDOMEN AND PELVIS WITH CONTRAST TECHNIQUE: Multidetector CT imaging of the abdomen and pelvis was performed using the standard protocol following bolus administration of intravenous contrast. CONTRAST:  24mL OMNIPAQUE IOHEXOL 300 MG/ML  SOLN COMPARISON:  None. FINDINGS: Lower chest: The lung bases are clear of an acute process. No infiltrates or effusions. No worrisome pulmonary nodules. The heart is within normal limits in size for age. No pericardial effusion. Mitral valve annular calcifications are noted. Moderate aortic calcifications. The distal esophagus is grossly normal Hepatobiliary: No hepatic  lesions are identified to suggest hepatic metastatic disease. The gallbladder is normal. No intra or extrahepatic biliary dilatation. Pancreas: No mass, inflammation or ductal dilatation. Spleen: Normal size. No focal lesions. Adrenals/Urinary Tract: The adrenal glands and kidneys are unremarkable. No worrisome renal lesions or hydronephrosis. Small renal cysts are noted. Severely thick-walled bladder which contains a Foley catheter. Findings could be due to severe chronic cystitis. I do not see a discrete mass but could not exclude the possibility of infiltrating tumor. Patient may need cystoscopic evaluation unless this is a known entity. Stomach/Bowel: The stomach, duodenum and small bowel are unremarkable. No acute inflammatory changes, mass lesions or obstructive findings. The terminal ileum is normal. The appendix is normal. Fairly extensive circumferential ascending colon mass extending from the ileocecal valve to the hepatic flexure and measuring approximately 6 cm in length. No obstruction. Contrast moves through this area and to the distal colon. There are moderate surrounding interstitial changes and  could not exclude direct invasion of the pericolonic fat. There also numerous borderline enlarged pericolonic lymph nodes which are somewhat worrisome for tumor involvement. Vascular/Lymphatic: Advanced atherosclerotic calcifications involving the aorta and iliac arteries. No enlarged retroperitoneal lymph nodes or upper abdominal lymph nodes. Reproductive: The uterus and ovaries are unremarkable. Other: Small amount of free pelvic fluid is noted. No inguinal mass or adenopathy. Musculoskeletal: No significant bony findings. IMPRESSION: 1. 6 cm circumferential ascending colon mass extending from the ileocecal valve to the hepatic flexure and possibly invading the pericolonic fat. Numerous borderline enlarged pericolonic lymph nodes are somewhat worrisome. 2. No findings for hepatic metastatic disease. 3.  Severely thick-walled bladder which contains a Foley catheter. Findings could be due to severe chronic cystitis. Underlying infiltrating tumor is possible. Recommend cystoscopic evaluation unless this is a known entity. 4. Advanced atherosclerotic calcifications involving the aorta and iliac arteries. Aortic Atherosclerosis (ICD10-I70.0). Electronically Signed   By: Marijo Sanes M.D.   On: 04/06/2020 11:56   ECHOCARDIOGRAM COMPLETE  Result Date: 04/07/2020    ECHOCARDIOGRAM REPORT   Patient Name:   Gabrielle [name] Date of Exam: 04/07/2020 Medical Rec #:  [id number]       Height:       62.0 in Accession #:    [id number]      Weight:       150.0 lb Date of Birth:  [birth year]      BSA:          1.692 m Patient Age:    60 years        BP:           123/66 mmHg Patient Gender: F               HR:           72 bpm. Exam Location:  [facility] Procedure: 2D Echo Indications:    Preoperative evaluation  History:        Patient has no prior history of Echocardiogram examinations.                 Risk Factors:Hypertension, Dyslipidemia and Non-Smoker. Stage 3                 chronic kidney disease, Macular degeneration.  Sonographer:    Leavy Cella RDCS (AE) Referring Phys: [place]  1. Left ventricular ejection fraction, by estimation, is 65 to 70%. The left ventricle has normal function. The left ventricle has no regional wall motion abnormalities. There is mild left ventricular hypertrophy. Left ventricular diastolic parameters are consistent with Grade I diastolic dysfunction (impaired relaxation).  2. Right ventricular systolic function is normal. The right ventricular size is normal. Tricuspid regurgitation signal is inadequate for assessing PA pressure.  3. Left atrial size was moderately dilated.  4. The mitral valve is grossly normal. Trivial mitral valve regurgitation.  5. The aortic valve is tricuspid. Aortic valve regurgitation is trivial. Mild aortic valve sclerosis is present,  with no evidence of aortic valve stenosis.  6. The inferior vena cava is normal in size with greater than 50% respiratory variability, suggesting right atrial pressure of 3 mmHg. FINDINGS  Left Ventricle: Left ventricular ejection fraction, by estimation, is 65 to 70%. The left ventricle has normal function. The left ventricle has no regional wall motion abnormalities. The left ventricular internal cavity size was normal in size. There is  mild left ventricular hypertrophy. Left ventricular diastolic parameters are consistent with Grade I diastolic dysfunction (impaired  relaxation). Right Ventricle: The right ventricular size is normal. No increase in right ventricular wall thickness. Right ventricular systolic function is normal. Tricuspid regurgitation signal is inadequate for assessing PA pressure. Left Atrium: Left atrial size was moderately dilated. Right Atrium: Right atrial size was normal in size. Pericardium: There is no evidence of pericardial effusion. Presence of pericardial fat pad. Mitral Valve: The mitral valve is grossly normal. Mild mitral annular calcification. Trivial mitral valve regurgitation. Tricuspid Valve: The tricuspid valve is grossly normal. Tricuspid valve regurgitation is trivial. Aortic Valve: The aortic valve is tricuspid. Aortic valve regurgitation is trivial. Mild aortic valve sclerosis is present, with no evidence of aortic valve stenosis. Mild to moderate aortic valve annular calcification. Pulmonic Valve: The pulmonic valve was grossly normal. Pulmonic valve regurgitation is trivial. Aorta: The aortic root is normal in size and structure. Venous: The inferior vena cava is normal in size with greater than 50% respiratory variability, suggesting right atrial pressure of 3 mmHg. IAS/Shunts: No atrial level shunt detected by color flow Doppler. Rozann Lesches MD Electronically signed by Rozann Lesches MD Signature Date/Time: 04/07/2020/10:04:33 AM    Final       Microbiology: Recent Results (from the past 240 hour(s))  Respiratory Panel by RT PCR (Flu A&B, Covid) - Nasopharyngeal Swab     Status: None   Collection Time: 04/04/20  3:37 PM   Specimen: Nasopharyngeal Swab  Result Value Ref Range Status   SARS Coronavirus 2 by RT PCR NEGATIVE NEGATIVE Final    Comment: (NOTE) SARS-CoV-2 target nucleic acids are NOT DETECTED. The SARS-CoV-2 RNA is generally detectable in upper respiratoy specimens during the acute phase of infection. The lowest concentration of SARS-CoV-2 viral copies this assay can detect is 131 copies/mL. A negative result does not preclude SARS-Cov-2 infection and should not be used as the sole basis for treatment or other patient management decisions. A negative result may occur with  improper specimen collection/handling, submission of specimen other than nasopharyngeal swab, presence of viral mutation(s) within the areas targeted by this assay, and inadequate number of viral copies (<131 copies/mL). A negative result must be combined with clinical observations, patient history, and epidemiological information. The expected result is Negative. Fact Sheet for Patients:  PinkCheek.be Fact Sheet for Healthcare Providers:  GravelBags.it This test is not yet ap proved or cleared by the [place] FDA and  has been authorized for detection and/or diagnosis of SARS-CoV-2 by FDA under an Emergency Use Authorization (EUA). This EUA will remain  in effect (meaning this test can be used) for the duration of the COVID-19 declaration under Section 564(b)(1) of the Act, 21 U.S.C. section 360bbb-3(b)(1), unless the authorization is terminated or revoked sooner.    Influenza A by PCR NEGATIVE NEGATIVE Final   Influenza B by PCR NEGATIVE NEGATIVE Final    Comment: (NOTE) The Xpert Xpress SARS-CoV-2/FLU/RSV assay is intended as an aid in  the diagnosis of influenza from  Nasopharyngeal swab specimens and  should not be used as a sole basis for treatment. Nasal washings and  aspirates are unacceptable for Xpert Xpress SARS-CoV-2/FLU/RSV  testing. Fact Sheet for Patients: PinkCheek.be Fact Sheet for Healthcare Providers: GravelBags.it This test is not yet approved or cleared by the [place] FDA and  has been authorized for detection and/or diagnosis of SARS-CoV-2 by  FDA under an Emergency Use Authorization (EUA). This EUA will remain  in effect (meaning this test can be used) for the duration of the  Covid-19 declaration under Section 564(b)(1) of  the Act, 21  U.S.C. section 360bbb-3(b)(1), unless the authorization is  terminated or revoked. Performed at [facility], [address], [place], [place] [address]   Urine culture     Status: None   Collection Time: 04/04/20  5:59 PM   Specimen: Urine, Clean Catch  Result Value Ref Range Status   Specimen Description   Final    URINE, CLEAN CATCH Performed at [facility], [address], [place], [place] [address]    Special Requests   Final    NONE Performed at [facility], [address] [place], [place] [address]    Culture   Final    NO GROWTH Performed at [facility], [place] [address] [place], [place] [address]    Report Status 04/06/2020 FINAL  Final  Surgical pcr screen     Status: None   Collection Time: 04/07/20  7:59 PM   Specimen: Nasal Mucosa; Nasal Swab  Result Value Ref Range Status   MRSA, PCR NEGATIVE NEGATIVE Final   Staphylococcus aureus NEGATIVE NEGATIVE Final    Comment: (NOTE) The Xpert SA Assay (FDA approved for NASAL specimens in patients 72 years of age and older), is one component of a comprehensive surveillance program. It is not intended to diagnose infection nor to guide or monitor treatment. Performed at [facility], [address], [place], [place] [address]      Labs: Basic Metabolic  Panel: Recent Labs  Lab 04/07/20 0458 04/07/20 8546 04/08/20 2703 04/08/20 0433 04/09/20 0459 04/09/20 0459 04/10/20 0452 04/10/20 0452 04/11/20 0548 04/12/20 0649  NA 137   < > 135  --  138  --  137  --  136 138  K 3.5   < > 3.0*   < > 3.5   < > 4.3   < > 4.3 4.3  CL 106   < > 106  --  103  --  105  --  106 108  CO2 23   < > 24  --  28  --  26  --  24 24  GLUCOSE 104*   < > 117*  --  96  --  94  --  86 92  BUN 13   < > 11  --  10  --  10  --  9 11  CREATININE 1.19*   < > 1.23*  --  1.51*  --  1.27*  --  1.03* 0.93  CALCIUM 8.2*   < > 8.2*  --  8.2*  --  8.2*  --  8.1* 7.8*  MG 2.0  --   --   --  1.8  --  2.2  --  2.0 1.9  PHOS  --   --   --   --  4.2  --  2.1*  --  1.9* 2.9   < > = values in this interval not displayed.   Liver Function Tests: Recent Labs  Lab 04/07/20 0458 04/08/20 0433 04/09/20 0459 04/10/20 0452 04/11/20 0548  AST 7* 10* 9* 9* 9*  ALT 5 5 5 5 7   ALKPHOS 56 54 49 47 50  BILITOT 0.6 0.5 0.3 0.3 0.4  PROT 5.6* 5.8* 5.2* 5.1* 5.0*  ALBUMIN 2.7* 2.7* 2.3* 2.2* 2.2*   No results for input(s): LIPASE, AMYLASE in the last 168 hours. No results for input(s): AMMONIA in the last 168 hours. CBC: Recent Labs  Lab 04/07/20 0458 04/09/20 0459 04/10/20 0452 04/11/20 0548 04/12/20 0649  WBC 7.9  10.0 8.7 6.7 5.3  NEUTROABS 5.7 7.1 6.5 4.4 3.3  HGB 9.5* 8.9* 8.3* 8.4* 7.5*  HCT 31.4* 31.1* 28.4* 28.8* 25.4*  MCV 87.5 91.7 88.2 88.3 88.5  PLT 245 226 215 243 213   Cardiac Enzymes: No results for input(s): CKTOTAL, CKMB, CKMBINDEX, TROPONINI in the last 168 hours. BNP: Invalid input(s): POCBNP CBG: Recent Labs  Lab 04/05/20 2113 04/06/20 1104 04/06/20 1626  GLUCAP 85 85 90    Time coordinating discharge:  36 minutes  Signed:  Orson Eva, DO [facility] Pager: [phone] 04/12/2020, 2:19 PM

## 2020-04-12 NOTE — Discharge Instructions (Signed)
Discharge Open Abdominal Surgery Instructions:  Common Complaints: Pain at the incision site is common. This will improve with time. Take your pain medications as described below. Some nausea is common and poor appetite. The main goal is to stay hydrated the first few days after surgery.   Diet/ Activity: Diet as tolerated. You have started and tolerated a diet in the hospital, and should continue to increase what you are able to eat.   You may not have a large appetite, but it is important to stay hydrated. Drink 64 ounces of water a day. Your appetite will return with time.   Start with eating bland or soft foods like eggs, toast, bananas. If you are going to eat meat start with chicken and fish and slowly work to beef.  Start with canned or cooked fruit without seeds or skin, such as applesauce and melon and canned or well cooked vegetables without seeds and skin for the first 2 weeks or so. Try to eat dairy products such as cheese, milk and yogurt.   Keep a dry dressing in place over your staples daily or as needed. Some minor pink/ blood tinged drainage is expected. This will stop in a few days after surgery.  Shower per your regular routine daily.  Do not take hot showers. Take warm showers that are less than 10 minutes. Pat the incision dry. Wear an abdominal binder daily with activity. You do not have to wear this while sleeping or sitting.  Rest and listen to your body, but do not remain in bed all day.  Walk everyday for at least 15-20 minutes. Deep cough and move around every 1-2 hours in the first few days after surgery.  Do not lift > 10 lbs, perform excessive bending, pushing, pulling, squatting for 6-8 weeks after surgery.  The activity restrictions and the abdominal binder are to prevent hernia formation at your incision while you are healing.  Do not place lotions or balms on your incision unless instructed to specifically by Dr. Constance Haw.   Pain Expectations and  Narcotics: -After surgery you will have pain associated with your incisions and this is normal. The pain is muscular and nerve pain, and will get better with time. -You are encouraged and expected to take non narcotic medications like tylenol and ibuprofen (when able) to treat pain as multiple modalities can aid with pain treatment. -Narcotics are only used when pain is severe or there is breakthrough pain. -You are not expected to have a pain score of 0 after surgery, as we cannot prevent pain. A pain score of 3-4 that allows you to be functional, move, walk, and tolerate some activity is the goal. The pain will continue to improve over the days after surgery and is dependent on your surgery. -Due to [place] law, we are only able to give a certain amount of pain medication to treat post operative pain, and we only give additional narcotics on a patient by patient basis.  -For most laparoscopic surgery, studies have shown that the majority of patients only need 10-15 narcotic pills, and for open surgeries most patients only need 15-20.   -Having appropriate expectations of pain and knowledge of pain management with non narcotics is important as we do not want anyone to become addicted to narcotic pain medication.  -Using ice packs in the first 48 hours and heating pads after 48 hours, wearing an abdominal binder (when recommended), and using over the counter medications are all ways to help with pain  management.   -Simple acts like meditation and mindfulness practices after surgery can also help with pain control and research has proven the benefit of these practices.  Medication: Take tylenol and ibuprofen as needed for pain control, alternating every 4-6 hours.  Example:  Tylenol 1000mg  @ 6am, 12noon, 6pm, 14midnight (Do not exceed 4000mg  of tylenol a day). Ibuprofen 800mg  @ 9am, 3pm, 9pm, 3am (Do not exceed 3600mg  of ibuprofen a day).  Take Roxicodone for breakthrough pain every 4 hours.  Take Colace  for constipation related to narcotic pain medication. If you do not have a bowel movement in 2 days, take Miralax over the counter.  Drink plenty of water to also prevent constipation.   Contact Information: If you have questions or concerns, please call our office, [phone], Monday- Thursday 8AM-5PM and Friday 8AM-12Noon.  If it is after hours or on the weekend, please call [facility], [phone], and ask to speak to the surgeon on call for Dr. Constance Haw at [facility].    Open Colectomy, Care After This sheet gives you information about how to care for yourself after your procedure. Your health care provider may also give you more specific instructions. If you have problems or questions, contact your health care provider. What can I expect after the procedure? After the procedure, it is common to have:  Pain in your abdomen, especially along your incision.  Tiredness. Your energy level will return to normal over the next several weeks.  Constipation.  Nausea.  Difficulty urinating. Follow these instructions at home: Activity  You may be able to return to most of your normal activities within 1-2 weeks, such as working, walking up stairs, and sexual activity.  Avoid activities that require a lot of energy for 4-6 weeks after surgery, such as running, climbing, and lifting heavy objects. Ask your health care provider what activities are safe for you.  Take rest breaks during the day as needed.  Do not drive for 1-2 weeks or until your health care provider says that it is safe.  Do not drive or use heavy machinery while taking prescription pain medicines.  Do not lift anything that is heavier than 10 lb (4.3 kg) until your health care provider says that it is safe. Incision care   Follow instructions from your health care provider about how to take care of your incision. Make sure you: ? Wash your hands with soap and water before you change your bandage (dressing).  If soap and water are not available, use hand sanitizer. ? Change your dressing as told by your health care provider. ? Leave stitches (sutures) or staples in place. These skin closures may need to stay in place for 2 weeks or longer.  Avoid wearing tight clothing around your incision.  Protect your incision area from the sun.  Check your incision area every day for signs of infection. Check for: ? More redness, swelling, or pain. ? More fluid or blood. ? Warmth. ? Pus or a bad smell. General instructions  Do not take baths, swim, or use a hot tub until your health care provider approves. Ask your health care provider when you may shower.  Take over-the-counter and prescription medicines, including stool softeners, only as told by your health care provider.  Eat a low-fat and low-fiber diet for the first 4 weeks after surgery.  Keep all follow-up visits as told by your health care provider. This is important. Contact a health care provider if:  You have more redness,  swelling, or pain around your incision.  You have more fluid or blood coming from your incision.  Your incision feels warm to the touch.  You have pus or a bad smell coming from your incision.  You have a fever or chills.  You do not have a bowel movement 2-3 days after surgery.  You cannot eat or drink for 24 hours or more.  You have persistent nausea and vomiting.  You have abdominal pain that gets worse and does not get better with medicine. Get help right away if:  You have chest pain.  You have shortness of breath.  You have pain or swelling in your legs.  Your incision breaks open after your sutures or staples have been removed.  You have bleeding from the rectum. This information is not intended to replace advice given to you by your health care provider. Make sure you discuss any questions you have with your health care provider. Document Revised: 11/03/2017 Document Reviewed:  08/22/2016 Elsevier Patient Education  2020 [employer].

## 2020-04-13 ENCOUNTER — Telehealth: Payer: Self-pay | Admitting: *Deleted

## 2020-04-13 NOTE — Telephone Encounter (Signed)
TRANSITIONAL CARE MANAGEMENT TELEPHONE OUTREACH NOTE   Contact Date: 04/13/2020 Contacted By: Eston Mould, LPN    DISCHARGE INFORMATION Date of Discharge:04/12/20 Discharge Facility: [facility] Discharge Diagnosis:Adenocarcinoma of the colon  Outpatient Follow Up Recommendations (copied from discharge summary) 1. Follow up with PCP in 1-2 weeks 2. Please obtain BMP/CBC in one week 3. Follow up with med/onc, general surgery, urology as seen below   Gabrielle [name] is a female primary care patient of Janora Norlander, DO. An outgoing telephone call was made today and I spoke with Gabrielle [name] her daughter.  Ms. Soyars condition(s) and treatment(s) were discussed. An opportunity to ask questions was provided and all were answered or forwarded as appropriate.    ACTIVITIES OF DAILY LIVING  Gabrielle [name] lives with their daughter and she can perform some ADLs independently.She does need help with bathing and dressing as well as getting up to ambulate. her primary caregiver is Gabrielle [name] her daughter. she is able to depend on her primary caregiver(s) for consistent help. Transportation to appointments, to pick up medications, and to run errands is not a problem.  (Consider referral to [facility] if transportation or a consistent caregiver is a problem)   Fall Risk Fall Risk  08/02/2018 04/02/2018  Falls in the past year? No No    medium Fall Risk   Home Modifications/Assistive Devices Wheelchair: No Cane: No Ramp: No Bedside Toilet: Yes Hospital Bed:  No    [place] she is receiving home health Nursing and Physical therapy services.  Gabrielle [name] RECONCILIATION  Ms. Juncaj has been able to pick-up all prescribed discharge medications from the pharmacy.   A post discharge medication reconciliation was performed and the complete medication list was reviewed with the patient/caregiver and is current as of 04/13/2020. Changes highlighted  below.  Discontinued Medications n/a  Current Medication List Allergies as of 04/13/2020      Reactions   Lisinopril Cough   Pt states she is not allergic to lisinopril      Medication List       Accurate as of Apr 13, 2020 10:21 AM. If you have any questions, ask your nurse or doctor.         acetaminophen 500 MG tablet Commonly known as: TYLENOL Take 1,000 mg by mouth every 6 (six) hours as needed.   alendronate 70 MG tablet Commonly known as: FOSAMAX Take 1 tablet (70 mg total) by mouth every 7 (seven) days. Take with a full glass of water on an empty stomach.   atorvastatin 20 MG tablet Commonly known as: LIPITOR Take 1 tablet (20 mg total) by mouth daily.   cholecalciferol 25 MCG (1000 UNIT) tablet Commonly known as: VITAMIN D3 Take 1,000 Units by mouth daily.   diclofenac Sodium 1 % Gel Commonly known as: VOLTAREN Apply 4 g topically 4 (four) times daily.   dorzolamide-timolol 22.3-6.8 MG/ML ophthalmic solution Commonly known as: COSOPT Place 1 drop into both eyes daily.   ferrous sulfate 325 (65 FE) MG EC tablet Take 1 tablet (325 mg total) by mouth 2 (two) times daily with a meal.   losartan-hydrochlorothiazide 100-25 MG tablet Commonly known as: HYZAAR TAKE 1 TABLET DAILY.  Needs to be seen for further refills.        PATIENT EDUCATION & FOLLOW-UP PLAN  An appointment for Transitional Care Management is scheduled with Evelina Dun, FNP on 04/20/20 at 3:55.  Take all medications as prescribed  Contact  our office by calling [phone] if you have any questions or concerns  Pt's daughter will call Urology and Hematology today to get follow up appt scheduled.  Pt will keep appt with General Surgery Curlene Labrum on 04/21/20 for post op and staple removal.

## 2020-04-14 DIAGNOSIS — C182 Malignant neoplasm of ascending colon: Secondary | ICD-10-CM | POA: Diagnosis not present

## 2020-04-14 DIAGNOSIS — D631 Anemia in chronic kidney disease: Secondary | ICD-10-CM | POA: Diagnosis not present

## 2020-04-14 DIAGNOSIS — I088 Other rheumatic multiple valve diseases: Secondary | ICD-10-CM | POA: Diagnosis not present

## 2020-04-14 DIAGNOSIS — I700 Atherosclerosis of aorta: Secondary | ICD-10-CM | POA: Diagnosis not present

## 2020-04-14 DIAGNOSIS — I708 Atherosclerosis of other arteries: Secondary | ICD-10-CM | POA: Diagnosis not present

## 2020-04-14 DIAGNOSIS — I1310 Hypertensive heart and chronic kidney disease without heart failure, with stage 1 through stage 4 chronic kidney disease, or unspecified chronic kidney disease: Secondary | ICD-10-CM | POA: Diagnosis not present

## 2020-04-14 DIAGNOSIS — N1832 Chronic kidney disease, stage 3b: Secondary | ICD-10-CM | POA: Diagnosis not present

## 2020-04-14 DIAGNOSIS — D630 Anemia in neoplastic disease: Secondary | ICD-10-CM | POA: Diagnosis not present

## 2020-04-14 DIAGNOSIS — Z483 Aftercare following surgery for neoplasm: Secondary | ICD-10-CM | POA: Diagnosis not present

## 2020-04-14 DIAGNOSIS — C189 Malignant neoplasm of colon, unspecified: Secondary | ICD-10-CM | POA: Diagnosis not present

## 2020-04-14 LAB — SURGICAL PATHOLOGY

## 2020-04-15 ENCOUNTER — Encounter: Payer: Self-pay | Admitting: Urology

## 2020-04-15 ENCOUNTER — Ambulatory Visit: Payer: Medicare HMO | Admitting: Urology

## 2020-04-15 ENCOUNTER — Other Ambulatory Visit: Payer: Self-pay

## 2020-04-15 VITALS — BP 156/84 | HR 90 | Temp 97.7°F | Ht 62.0 in | Wt 150.0 lb

## 2020-04-15 DIAGNOSIS — I708 Atherosclerosis of other arteries: Secondary | ICD-10-CM | POA: Diagnosis not present

## 2020-04-15 DIAGNOSIS — D631 Anemia in chronic kidney disease: Secondary | ICD-10-CM | POA: Diagnosis not present

## 2020-04-15 DIAGNOSIS — D630 Anemia in neoplastic disease: Secondary | ICD-10-CM | POA: Diagnosis not present

## 2020-04-15 DIAGNOSIS — I1310 Hypertensive heart and chronic kidney disease without heart failure, with stage 1 through stage 4 chronic kidney disease, or unspecified chronic kidney disease: Secondary | ICD-10-CM | POA: Diagnosis not present

## 2020-04-15 DIAGNOSIS — R339 Retention of urine, unspecified: Secondary | ICD-10-CM

## 2020-04-15 DIAGNOSIS — I088 Other rheumatic multiple valve diseases: Secondary | ICD-10-CM | POA: Diagnosis not present

## 2020-04-15 DIAGNOSIS — Z483 Aftercare following surgery for neoplasm: Secondary | ICD-10-CM | POA: Diagnosis not present

## 2020-04-15 DIAGNOSIS — N1832 Chronic kidney disease, stage 3b: Secondary | ICD-10-CM | POA: Diagnosis not present

## 2020-04-15 DIAGNOSIS — C182 Malignant neoplasm of ascending colon: Secondary | ICD-10-CM | POA: Diagnosis not present

## 2020-04-15 DIAGNOSIS — I700 Atherosclerosis of aorta: Secondary | ICD-10-CM | POA: Diagnosis not present

## 2020-04-15 HISTORY — DX: Retention of urine, unspecified: R33.9

## 2020-04-15 LAB — BLADDER SCAN AMB NON-IMAGING
Scan Result: 119.9
Scan Result: 268.7

## 2020-04-15 NOTE — Progress Notes (Signed)
Urological Symptom Review  Patient is experiencing the following symptoms: none   Review of Systems  Gastrointestinal (upper)  : Negative for upper GI symptoms  Gastrointestinal (lower) : Diarrhea  Constitutional : Negative for symptoms  Skin: Negative for skin symptoms  Eyes: Negative for eye symptoms  Ear/Nose/Throat : Negative for Ear/Nose/Throat symptoms  Hematologic/Lymphatic: Negative for Hematologic/Lymphatic symptoms  Cardiovascular : Leg swelling  Respiratory : Negative for respiratory symptoms  Endocrine: Negative for endocrine symptoms  Musculoskeletal: Negative for musculoskeletal symptoms  Neurological: Negative for neurological symptoms  Psychologic: Negative for psychiatric symptoms  Fill and Pull Catheter Removal  Patient is present today for a catheter removal.  Patient was cleaned and prepped in a sterile fashion 267mlml of sterile water/ saline was instilled into the bladder when the patient felt the urge to urinate. 57ml of water was then drained from the balloon.  A 20FR foley cath was removed from the bladder no complications were noted .  Patient was then given some time to void on their own.  Patient cannot void.  Patient will go home and drink plenty of fluid and try to void. She will return back this afternoon for a pvr.    Performed by: Rodina Pinales LPN  Follow up/ Additional notes: Per MD note I month OV w/ pvr

## 2020-04-15 NOTE — Progress Notes (Signed)
04/15/2020 11:43 AM   Gabrielle [name] 1946/07/16 [id number]  Referring provider: Janora Norlander, DO [place],  [place] [address]  Bladder mass and urinary retention  HPI: Gabrielle [name] is a 74yo here for followup for bladder biopsy. Pathology was benign tissue. She was unable to void in our office   PMH: Past Medical History:  Diagnosis Date  . Cataract    OU  . H/O cesarean section   . Hx of tonsillectomy   . Hyperlipidemia   . Hypertension   . Macular degeneration    Exu ARMD OU    Surgical History: Past Surgical History:  Procedure Laterality Date  . BIOPSY  04/06/2020   Procedure: BIOPSY;  Surgeon: Rogene Houston, MD;  [facility];  Service: Endoscopy;;  . CARPAL TUNNEL RELEASE Right   . CATARACT EXTRACTION W/PHACO Left 10/11/2019   Procedure: CATARACT EXTRACTION PHACO AND INTRAOCULAR LENS PLACEMENT (IOC);  Surgeon: Baruch Goldmann, MD;  Location: [facility];  Service: Ophthalmology;  Laterality: Left;  CDE: 8.56  . CATARACT EXTRACTION W/PHACO Right 10/25/2019   Procedure: CATARACT EXTRACTION PHACO AND INTRAOCULAR LENS PLACEMENT (IOC);  Surgeon: Baruch Goldmann, MD;  Location: [facility];  Service: Ophthalmology;  Laterality: Right;  CDE: 5.67  . CESAREAN SECTION    . COLONOSCOPY N/A 04/06/2020   Procedure: COLONOSCOPY;  Surgeon: Rogene Houston, MD;  [facility]: [facility];  Service: Endoscopy;  Laterality: N/A;  . CYSTOSCOPY WITH BIOPSY N/A 04/08/2020   Procedure: CYSTOSCOPY WITH BIOPSY;  Surgeon: Cleon Gustin, MD;  Location: [facility];  Service: Urology;  Laterality: N/A;  . ESOPHAGOGASTRODUODENOSCOPY N/A 04/05/2020   Procedure: ESOPHAGOGASTRODUODENOSCOPY (EGD);  Surgeon: Rogene Houston, MD;  [facility];  Service: Endoscopy;  Laterality: N/A;  . PARTIAL COLECTOMY N/A 04/08/2020   Procedure: PARTIAL COLECTOMY;  Surgeon: Virl Cagey, MD;  Location: [facility];  Service: General;  Laterality: N/A;  . TONSILLECTOMY      Home  Medications:  Allergies as of 04/15/2020      Reactions   Lisinopril Cough   Pt states she is not allergic to lisinopril      Medication List       Accurate as of Apr 15, 2020 11:43 AM. If you have any questions, ask your nurse or doctor.        acetaminophen 500 MG tablet Commonly known as: TYLENOL Take 1,000 mg by mouth every 6 (six) hours as needed.   alendronate 70 MG tablet Commonly known as: FOSAMAX Take 1 tablet (70 mg total) by mouth every 7 (seven) days. Take with a full glass of water on an empty stomach.   atorvastatin 20 MG tablet Commonly known as: LIPITOR Take 1 tablet (20 mg total) by mouth daily.   cholecalciferol 25 MCG (1000 UNIT) tablet Commonly known as: VITAMIN D3 Take 1,000 Units by mouth daily.   diclofenac Sodium 1 % Gel Commonly known as: VOLTAREN Apply 4 g topically 4 (four) times daily.   dorzolamide-timolol 22.3-6.8 MG/ML ophthalmic solution Commonly known as: COSOPT Place 1 drop into both eyes daily.   ferrous sulfate 325 (65 FE) MG EC tablet Take 1 tablet (325 mg total) by mouth 2 (two) times daily with a meal.   losartan-hydrochlorothiazide 100-25 MG tablet Commonly known as: HYZAAR TAKE 1 TABLET DAILY.  Needs to be seen for further refills.   Shingrix injection Generic drug: Zoster Vaccine Adjuvanted       Allergies:  Allergies  Allergen  Reactions  . Lisinopril Cough    Pt states she is not allergic to lisinopril    Family History: Family History  Problem Relation Age of Onset  . Macular degeneration Mother   . Stroke Mother   . Hypertension Mother   . Dementia Mother   . Cancer Father        lung  . Diabetes Sister   . Hypertension Sister     Social History:  reports that she has never smoked. She has never used smokeless tobacco. She reports that she does not drink alcohol or use drugs.  ROS: All other review of systems were reviewed and are negative except what is noted above in HPI  Physical Exam: BP (!)  156/84   Pulse 90   Temp 97.7 F (36.5 C)   Ht 5\' 2"  (1.575 m)   Wt 150 lb (68 kg)   BMI 27.44 kg/m   Constitutional:  Alert and oriented, No acute distress. HEENT: [place] AT, moist mucus membranes.  Trachea midline, no masses. Cardiovascular: No clubbing, cyanosis, or edema. Respiratory: Normal respiratory effort, no increased work of breathing. GI: Abdomen is soft, nontender, nondistended, no abdominal masses GU: No CVA tenderness.  Lymph: No cervical or inguinal lymphadenopathy. Skin: No rashes, bruises or suspicious lesions. Neurologic: Grossly intact, no focal deficits, moving all 4 extremities. Psychiatric: Normal mood and affect.  Laboratory Data: Lab Results  Component Value Date   WBC 5.3 04/12/2020   HGB 7.5 (L) 04/12/2020   HCT 25.4 (L) 04/12/2020   MCV 88.5 04/12/2020   PLT 213 04/12/2020    Lab Results  Component Value Date   CREATININE 0.93 04/12/2020    No results found for: PSA  No results found for: TESTOSTERONE  Lab Results  Component Value Date   HGBA1C 6.0 01/03/2018    Urinalysis    Component Value Date/Time   COLORURINE YELLOW 04/04/2020 1758   APPEARANCEUR CLEAR 04/04/2020 1758   LABSPEC 1.008 04/04/2020 1758   PHURINE 8.0 04/04/2020 1758   GLUCOSEU NEGATIVE 04/04/2020 1758   HGBUR NEGATIVE 04/04/2020 1758   BILIRUBINUR NEGATIVE 04/04/2020 1758   KETONESUR NEGATIVE 04/04/2020 1758   PROTEINUR NEGATIVE 04/04/2020 1758   NITRITE NEGATIVE 04/04/2020 1758   LEUKOCYTESUR LARGE (A) 04/04/2020 1758    Lab Results  Component Value Date   BACTERIA RARE (A) 04/04/2020    Pertinent Imaging:  No results found for this or any previous visit. No results found for this or any previous visit. No results found for this or any previous visit. No results found for this or any previous visit. No results found for this or any previous visit. No results found for this or any previous visit. No results found for this or any previous visit. No  results found for this or any previous visit.  Assessment & Plan:    1. Urinary retention -Patient instructed to return this afternoon for PVR. If PVR low, RTC 1 month with PVR   No follow-ups on file.  Nicolette Bang, MD  [facility] [place]

## 2020-04-15 NOTE — Patient Instructions (Signed)
Acute Urinary Retention, Female  Acute urinary retention means that you cannot pee (urinate) at all, or that you pee too little and your bladder is not emptied completely. If it is not treated, it can lead to kidney damage or other serious problems. Follow these instructions at home:  Take over-the-counter and prescription medicines only as told by your doctor. Ask your doctor what medicines you should stay away from. Do not take any medicine unless your doctor says it is okay to do so.  If you were sent home with a tube that drains pee from the bladder (catheter), take care of it as told by your doctor.  Drink enough fluid to keep your pee clear or pale yellow.  If you were given an antibiotic, take it as told by your doctor. Do not stop taking the antibiotic even if you start to feel better.  Do not use any products that contain nicotine or tobacco, such as cigarettes and e-cigarettes. If you need help quitting, ask your doctor.  Watch for changes in your symptoms. Tell your doctor about them.  If told, keep track of any changes in your blood pressure at home. Tell your doctor about them.  Keep all follow-up visits as told by your doctor. This is important. Contact a doctor if:  You have spasms or you leak pee when you have spasms. Get help right away if:  You have chills or a fever.  You have blood in your pee.  You have a tube that drains the bladder and: ? The tube stops draining pee. ? The tube falls out. Summary  Acute urinary retention means that you cannot pee at all, or that you pee too little and your bladder is not emptied completely. If it is not treated, it can result in kidney damage or other serious problems.  If you were sent home with a tube that drains pee from the bladder, take care of it as told by your doctor.  Pay attention to any changes in your symptoms. Tell your doctor about them. This information is not intended to replace advice given to you by your  health care provider. Make sure you discuss any questions you have with your health care provider. Document Revised: 11/03/2017 Document Reviewed: 12/23/2016 Elsevier Patient Education  2020 [employer]  

## 2020-04-17 ENCOUNTER — Ambulatory Visit: Payer: Medicare HMO | Admitting: Gastroenterology

## 2020-04-20 ENCOUNTER — Ambulatory Visit (INDEPENDENT_AMBULATORY_CARE_PROVIDER_SITE_OTHER): Payer: Medicare HMO | Admitting: Family

## 2020-04-20 ENCOUNTER — Encounter: Payer: Self-pay | Admitting: Family

## 2020-04-20 ENCOUNTER — Other Ambulatory Visit: Payer: Self-pay

## 2020-04-20 VITALS — BP 142/67 | HR 74 | Temp 97.3°F | Ht 62.0 in | Wt 138.8 lb

## 2020-04-20 DIAGNOSIS — R531 Weakness: Secondary | ICD-10-CM | POA: Diagnosis not present

## 2020-04-20 DIAGNOSIS — N3289 Other specified disorders of bladder: Secondary | ICD-10-CM

## 2020-04-20 DIAGNOSIS — C182 Malignant neoplasm of ascending colon: Secondary | ICD-10-CM

## 2020-04-20 DIAGNOSIS — Z09 Encounter for follow-up examination after completed treatment for conditions other than malignant neoplasm: Secondary | ICD-10-CM | POA: Diagnosis not present

## 2020-04-20 DIAGNOSIS — I700 Atherosclerosis of aorta: Secondary | ICD-10-CM | POA: Diagnosis not present

## 2020-04-20 DIAGNOSIS — D630 Anemia in neoplastic disease: Secondary | ICD-10-CM | POA: Diagnosis not present

## 2020-04-20 DIAGNOSIS — I708 Atherosclerosis of other arteries: Secondary | ICD-10-CM | POA: Diagnosis not present

## 2020-04-20 DIAGNOSIS — I088 Other rheumatic multiple valve diseases: Secondary | ICD-10-CM | POA: Diagnosis not present

## 2020-04-20 DIAGNOSIS — D631 Anemia in chronic kidney disease: Secondary | ICD-10-CM | POA: Diagnosis not present

## 2020-04-20 DIAGNOSIS — Z483 Aftercare following surgery for neoplasm: Secondary | ICD-10-CM | POA: Diagnosis not present

## 2020-04-20 DIAGNOSIS — K922 Gastrointestinal hemorrhage, unspecified: Secondary | ICD-10-CM

## 2020-04-20 DIAGNOSIS — I1310 Hypertensive heart and chronic kidney disease without heart failure, with stage 1 through stage 4 chronic kidney disease, or unspecified chronic kidney disease: Secondary | ICD-10-CM | POA: Diagnosis not present

## 2020-04-20 DIAGNOSIS — N1832 Chronic kidney disease, stage 3b: Secondary | ICD-10-CM | POA: Diagnosis not present

## 2020-04-20 NOTE — Patient Instructions (Signed)
Gastrointestinal Bleeding Gastrointestinal (GI) bleeding is bleeding somewhere along the digestive tract, between the mouth and the anus. This tract includes the mouth, esophagus, stomach, small intestine, large intestine, and anus. The large intestine is often called the colon. GI bleeding can be caused by various problems. The severity of these problems can range from mild to serious or even life-threatening. If you have GI bleeding, you may find blood in your stools (feces), you may have black stools, or you may vomit blood. If there is a lot of bleeding, you may need to stay in the hospital. What are the causes? This condition may be caused by:  Inflammation, irritation, or swelling of the esophagus (esophagitis). The esophagus is part of the body that moves food from your mouth to your stomach.  Swollen veins in the rectum (hemorrhoids).  Areas of painful tearing in the anus that are often caused by passing hard stool (anal fissures).  Pouches that form on the colon over time, with age, and may bleed a lot (diverticulosis).  Inflammation (diverticulitis) in areas with diverticulosis. This can cause pain, fever, and bloody stools, although bleeding may be mild.  Growths (polyps) or cancer. Colon cancer often starts out as precancerous polyps.  Gastritis and ulcers. With these, bleeding may come from the upper GI tract, near the stomach. What increases the risk? You are more likely to develop this condition if you:  Have an infection in your stomach from a type of bacteria called Helicobacter pylori.  Take certain medicines, such as: ? NSAIDs. ? Aspirin. ? Selective serotonin reuptake inhibitors (SSRIs). ? Steroids. ? Antiplatelet or anticoagulant medicines.  Smoke.  Drink alcohol. What are the signs or symptoms? Common symptoms of this condition include:  Bright red blood in your vomit, or vomit that looks like coffee grounds.  Bloody, black, or tarry stools. ? Bleeding  from the lower GI tract will usually cause red or maroon blood in the stools. ? Bleeding from the upper GI tract may cause black, tarry stools that are often stronger smelling than usual. ? In certain cases, if the bleeding is fast enough, the stools may be red.  Pain or cramping in the abdomen. How is this diagnosed? This condition may be diagnosed based on:  Your medical history and a physical exam.  Various tests, such as: ? Blood tests. ? Stool tests. ? X-rays and other imaging tests. ? Esophagogastroduodenoscopy (EGD). In this test, a flexible, lighted tube is used to look at your esophagus, stomach, and small intestine. ? Colonoscopy. In this test, a flexible, lighted tube is used to look at your colon. How is this treated? Treatment for this condition depends on the cause of the bleeding. For example:  For bleeding from the esophagus, stomach, small intestine, or colon, the health care provider doing your EGD or colonoscopy may be able to stop the bleeding as part of the procedure.  Inflammation or infection of the colon can be treated with medicines.  Certain rectal problems can be treated with creams, suppositories, or warm baths.  Medicines may be given to reduce acid in your stomach.  Surgery is sometimes needed.  Blood transfusions are sometimes needed if a lot of blood has been lost. If bleeding is mild, you may be allowed to go home. If there is a lot of bleeding, you will need to stay in the hospital for observation. Follow these instructions at home:   Take over-the-counter and prescription medicines only as told by your health care provider.    Eat foods that are high in fiber, such as beans, whole grains, and fresh fruits and vegetables. This will help to keep your stools soft. Eating 1-3 prunes each day works well for many people.  Drink enough fluid to keep your urine pale yellow.  Keep all follow-up visits as told by your health care provider. This is  important. Contact a health care provider if:  Your symptoms do not improve. Get help right away if:  Your bleeding does not stop.  You feel light-headed or you faint.  You feel weak.  You have severe cramps in your back or abdomen.  You pass large blood clots in your stool.  Your symptoms are getting worse.  You have chest pain or fast heartbeats. Summary  Gastrointestinal (GI) bleeding is bleeding somewhere along the digestive tract, between the mouth and anus. GI bleeding can be caused by various problems. The severity of these problems can range from mild to serious or even life-threatening.  Treatment for this condition depends on the cause of the bleeding.  Take over-the-counter and prescription medicines only as told by your health care provider.  Keep all follow-up visits as told by your health care provider. This is important.  Get help right away if your bleeding increases, your symptoms are getting worse, or you have new symptoms. This information is not intended to replace advice given to you by your health care provider. Make sure you discuss any questions you have with your health care provider. Document Revised: 07/04/2018 Document Reviewed: 07/04/2018 Elsevier Patient Education  2020 [employer]  

## 2020-04-20 NOTE — Progress Notes (Signed)
Subjective:    Patient ID: Gabrielle [name], female    DOB: 03-Jul-1946, 74 y.o.   MRN: [id number]  Chief Complaint  Patient presents with  . Hospitalization Follow-up    HPI Today's visit is for Transitional Care Management.  The patient was discharged from [facility] on 04/12/20 with a primary diagnosis of Adenocarcinoma of the colon.   Contact with the patient and/or caregiver, by a clinical staff member, was made on 04/13/20 and was documented as a telephone encounter within the EMR.  Through chart review and discussion with the patient I have determined that management of their condition is of moderate complexity.    PT went to the ED on 04/04/20 with generalized weakness and low hgb. She had a colonoscopy on 04/05/20 that  revealed malignant tumor involving ascending colon and endoscopically appeared to be adenocarcinoma of the colon. Patient underwent right hemicolectomy on 04/08/2020. She was followed to have bladder thickness and has Urologists follow up. She has follow up with Oncologists next week. She has appt with General Surgeon tomorrow to have staples removed.   PT reports she is feeling better and stronger now. Her daughter states her stools are still dark, but improving.   Review of Systems  Constitutional: Positive for fever.  All other systems reviewed and are negative.      Objective:   Physical Exam Vitals reviewed.  Constitutional:      General: She is not in acute distress.    Appearance: She is well-developed.  HENT:     Head: Normocephalic and atraumatic.     Right Ear: Tympanic membrane normal.     Left Ear: Tympanic membrane normal.  Eyes:     Pupils: Pupils are equal, round, and reactive to light.  Neck:     Thyroid: No thyromegaly.  Cardiovascular:     Rate and Rhythm: Normal rate and regular rhythm.     Heart sounds: Normal heart sounds. No murmur.  Pulmonary:     Effort: Pulmonary effort is normal. No respiratory distress.     Breath  sounds: Normal breath sounds. No wheezing.  Abdominal:     General: Bowel sounds are normal. There is no distension.     Palpations: Abdomen is soft.     Tenderness: There is no abdominal tenderness.     Comments: Well approximated incision  with staples intact  Musculoskeletal:        General: No tenderness. Normal range of motion.     Cervical back: Normal range of motion and neck supple.  Skin:    General: Skin is warm and dry.  Neurological:     Mental Status: She is alert and oriented to person, place, and time.     Cranial Nerves: No cranial nerve deficit.     Deep Tendon Reflexes: Reflexes are normal and symmetric.  Psychiatric:        Behavior: Behavior normal.        Thought Content: Thought content normal.        Judgment: Judgment normal.     BP (!) 142/67   Pulse 74   Temp (!) 97.3 F (36.3 C) (Temporal)   Ht '5\' 2"'  (1.575 m)   Wt 138 lb 12.8 oz (63 kg)   SpO2 99%   BMI 25.39 kg/m        Assessment & Plan:  Gabrielle [name] comes in today with chief complaint of Hospitalization Follow-up   Diagnosis and orders addressed:  1. Hospital discharge follow-up -  For home use only DME 4 wheeled rolling walker with seat ([id number]) - CMP14+EGFR - CBC with Differential/Platelet  2. Malignant neoplasm of ascending colon ([place]) - For home use only DME 4 wheeled rolling walker with seat ([id number]) - CMP14+EGFR - CBC with Differential/Platelet  3. Bladder wall thickening - For home use only DME 4 wheeled rolling walker with seat ([id number]) - CMP14+EGFR - CBC with Differential/Platelet  4. Gastrointestinal hemorrhage, unspecified gastrointestinal hemorrhage type - For home use only DME 4 wheeled rolling walker with seat ([id number]) - CMP14+EGFR - CBC with Differential/Platelet  5. Weakness - For home use only DME 4 wheeled rolling walker with seat ([id number]) - CMP14+EGFR - CBC with Differential/Platelet   Labs pending Health Maintenance reviewed Diet and  exercise encouraged  Follow up plan: Keep follow up with Specialists and PCP   Evelina Dun, FNP

## 2020-04-21 ENCOUNTER — Other Ambulatory Visit: Payer: Self-pay | Admitting: Family

## 2020-04-21 ENCOUNTER — Encounter: Payer: Self-pay | Admitting: General Surgery

## 2020-04-21 ENCOUNTER — Ambulatory Visit (INDEPENDENT_AMBULATORY_CARE_PROVIDER_SITE_OTHER): Payer: Self-pay | Admitting: General Surgery

## 2020-04-21 VITALS — BP 169/82 | HR 69 | Temp 98.0°F | Ht 62.0 in | Wt 138.0 lb

## 2020-04-21 DIAGNOSIS — C182 Malignant neoplasm of ascending colon: Secondary | ICD-10-CM

## 2020-04-21 DIAGNOSIS — R531 Weakness: Secondary | ICD-10-CM | POA: Diagnosis not present

## 2020-04-21 LAB — CMP14+EGFR
ALT: 5 IU/L (ref 0–32)
AST: 10 IU/L (ref 0–40)
Albumin/Globulin Ratio: 1.2 (ref 1.2–2.2)
Albumin: 3.3 g/dL — ABNORMAL LOW (ref 3.7–4.7)
Alkaline Phosphatase: 86 IU/L (ref 48–121)
BUN/Creatinine Ratio: 10 — ABNORMAL LOW (ref 12–28)
BUN: 15 mg/dL (ref 8–27)
Bilirubin Total: <0.2 mg/dL (ref 0.0–1.2)
CO2: 30 mmol/L — ABNORMAL HIGH (ref 20–29)
Calcium: 8.7 mg/dL (ref 8.7–10.3)
Chloride: 96 mmol/L (ref 96–106)
Creatinine, Ser: 1.43 mg/dL — ABNORMAL HIGH (ref 0.57–1.00)
GFR calc Af Amer: 42 mL/min/{1.73_m2} — ABNORMAL LOW (ref >59)
GFR calc non Af Amer: 36 mL/min/{1.73_m2} — ABNORMAL LOW (ref >59)
Globulin, Total: 2.7 g/dL (ref 1.5–4.5)
Glucose: 89 mg/dL (ref 65–99)
Potassium: 3.1 mmol/L — ABNORMAL LOW (ref 3.5–5.2)
Sodium: 139 mmol/L (ref 134–144)
Total Protein: 6 g/dL (ref 6.0–8.5)

## 2020-04-21 LAB — CBC WITH DIFFERENTIAL/PLATELET
Basophils Absolute: 0 10*3/uL (ref 0.0–0.2)
Basos: 1 %
EOS (ABSOLUTE): 0.2 10*3/uL (ref 0.0–0.4)
Eos: 3 %
Hematocrit: 25.4 % — ABNORMAL LOW (ref 34.0–46.6)
Hemoglobin: 7.9 g/dL — CL (ref 11.1–15.9)
Immature Grans (Abs): 0 10*3/uL (ref 0.0–0.1)
Immature Granulocytes: 0 %
Lymphocytes Absolute: 1.5 10*3/uL (ref 0.7–3.1)
Lymphs: 24 %
MCH: 26.7 pg (ref 26.6–33.0)
MCHC: 31.1 g/dL — ABNORMAL LOW (ref 31.5–35.7)
MCV: 86 fL (ref 79–97)
Monocytes Absolute: 0.6 10*3/uL (ref 0.1–0.9)
Monocytes: 11 %
Neutrophils Absolute: 3.7 10*3/uL (ref 1.4–7.0)
Neutrophils: 61 %
Platelets: 247 10*3/uL (ref 150–450)
RBC: 2.96 x10E6/uL — ABNORMAL LOW (ref 3.77–5.28)
RDW: 18.5 % — ABNORMAL HIGH (ref 11.7–15.4)
WBC: 6.1 10*3/uL (ref 3.4–10.8)

## 2020-04-21 MED ORDER — POTASSIUM CHLORIDE CRYS ER 20 MEQ PO TBCR: 20.0000 meq | EXTENDED_RELEASE_TABLET | Freq: Every day | ORAL | 1 refills | Status: DC

## 2020-04-21 NOTE — Progress Notes (Signed)
[facility] History and Physical   Chief Complaint    Routine Post Op      Gabrielle [name] is a 74 y.o. female.  HPI:  Gabrielle [name] is 74 yo who is known to me and underwent a right hemicolectomy for cancer. Gabrielle [name] is T4a,N1a and says Gabrielle [name] is doing well.  Gabrielle [name] also had bladder biopsies due to a thickened bladder and this came back inflammatory.   Gabrielle [name] is doing well post operatively and is tolerating a diet. Gabrielle [name] is having good pain control and is ambulating. Gabrielle [name] is ready to start eating more variety of foods. Gabrielle [name] has been having regular BMs. Her foley is out from her biopsy.    Pathology: FINAL MICROSCOPIC DIAGNOSIS:   A. BLADDER, BIOPSY:  Benign urothelium and submucosa  with prominent inflammation, vascular ectasia and reactive changes   B. COLON, RIGHT, RESECTION:  Poorly differentiated adenocarcinoma of the ascending colon (Grade 3,  size 8.0 cm)  Tumor invades through the visceral peritoneum (pT4a)  Lymphovascular invasion is identified  Radial margin of resection is positive for tumor   One tumor deposit identified  Metastatic adenocarcinoma in one of fifteen nodes (1/15, 0.7 cm)   COLON AND RECTUM:  Procedure: Right hemicolectomy  Tumor Site: Ascending colon  Tumor Size: 8.0 cm  Macroscopic Tumor Perforation: Tumor invades through the entire  thickness of the wall, into visceral serosa. There is also tumor  undermining mucosa and involving ileocecal valve.  Histologic Type: Adenocarcinoma  Histologic Grade: G3  Tumor Extension: through bowel wall into visceral peritoneum  Margins: radial margin is positive  Treatment Effect: Negative  Lymphovascular Invasion: identified  Perineural Invasion: Identified  Tumor Deposits: identified  Regional Lymph Nodes:    No lymph nodes submitted or found: NA    Number of Lymph Nodes Involved: 1    Number of Lymph Nodes Examined: 15  Pathologic Stage Classification (pTNM, AJCC 8th Edition): pT4a, pN1a    Ancillary Studies: Loss of the major and minor MMR proteins MLH1 and  PMS2 (performed on biopsy).   (MMR / MSI testing: )  Representative tumor block: B7  Comments:  Part 1: Immunostain has been perform on bladder biopsy specimen, the  urothelial cells are positive for ck8/18, p63, high molecular  cytokeratin, negative for p53, ck20. The results support the above  diagnosis.   Past Medical History:  Diagnosis Date  . Cataract    OU  . H/O cesarean section   . Hx of tonsillectomy   . Hyperlipidemia   . Hypertension   . Macular degeneration    Exu ARMD OU    Past Surgical History:  Procedure Laterality Date  . BIOPSY  04/06/2020   Procedure: BIOPSY;  Surgeon: Rogene Houston, MD;  Location: [facility];  Service: Endoscopy;;  . CARPAL TUNNEL RELEASE Right   . CATARACT EXTRACTION W/PHACO Left 10/11/2019   Procedure: CATARACT EXTRACTION PHACO AND INTRAOCULAR LENS PLACEMENT (IOC);  Surgeon: Baruch Goldmann, MD;  Location: [facility];  Service: Ophthalmology;  Laterality: Left;  CDE: 8.56  . CATARACT EXTRACTION W/PHACO Right 10/25/2019   Procedure: CATARACT EXTRACTION PHACO AND INTRAOCULAR LENS PLACEMENT (IOC);  Surgeon: Baruch Goldmann, MD;  Location: [facility];  Service: Ophthalmology;  Laterality: Right;  CDE: 5.67  . CESAREAN SECTION    . COLONOSCOPY N/A 04/06/2020   Procedure: COLONOSCOPY;  Surgeon: Rogene Houston, MD;  Location: [facility];  Service: Endoscopy;  Laterality: N/A;  . CYSTOSCOPY WITH BIOPSY N/A 04/08/2020   Procedure:  CYSTOSCOPY WITH BIOPSY;  Surgeon: Cleon Gustin, MD;  Location: [facility];  Service: Urology;  Laterality: N/A;  . ESOPHAGOGASTRODUODENOSCOPY N/A 04/05/2020   Procedure: ESOPHAGOGASTRODUODENOSCOPY (EGD);  Surgeon: Rogene Houston, MD;  Location: [facility];  Service: Endoscopy;  Laterality: N/A;  . PARTIAL COLECTOMY N/A 04/08/2020   Procedure: PARTIAL COLECTOMY;  Surgeon: Virl Cagey, MD;  Location: [facility];  Service: General;  Laterality: N/A;   . TONSILLECTOMY      Family History  Problem Relation Age of Onset  . Macular degeneration Mother   . Stroke Mother   . Hypertension Mother   . Dementia Mother   . Cancer Father        lung  . Diabetes Sister   . Hypertension Sister     Social History   Tobacco Use  . Smoking status: Never Smoker  . Smokeless tobacco: Never Used  Substance Use Topics  . Alcohol use: No  . Drug use: No    Medications: I have reviewed the patient's current medications. Allergies as of 04/21/2020      Reactions   Lisinopril Cough   Pt states Gabrielle [name] is not allergic to lisinopril      Medication List       Accurate as of Apr 21, 2020 11:59 PM. If you have any questions, ask your nurse or doctor.        acetaminophen 500 MG tablet Commonly known as: TYLENOL Take 1,000 mg by mouth every 6 (six) hours as needed.   alendronate 70 MG tablet Commonly known as: FOSAMAX Take 1 tablet (70 mg total) by mouth every 7 (seven) days. Take with a full glass of water on an empty stomach.   atorvastatin 20 MG tablet Commonly known as: LIPITOR Take 1 tablet (20 mg total) by mouth daily.   cholecalciferol 25 MCG (1000 UNIT) tablet Commonly known as: VITAMIN D3 Take 1,000 Units by mouth daily.   diclofenac Sodium 1 % Gel Commonly known as: VOLTAREN Apply 4 g topically 4 (four) times daily.   dorzolamide-timolol 22.3-6.8 MG/ML ophthalmic solution Commonly known as: COSOPT Place 1 drop into both eyes daily.   ferrous sulfate 325 (65 FE) MG EC tablet Take 1 tablet (325 mg total) by mouth 2 (two) times daily with a meal.   losartan-hydrochlorothiazide 100-25 MG tablet Commonly known as: HYZAAR TAKE 1 TABLET DAILY.  Needs to be seen for further refills.   potassium chloride SA 20 MEQ tablet Commonly known as: KLOR-CON Take 1 tablet (20 mEq total) by mouth daily. Started by: Evelina Dun, FNP        ROS:  A comprehensive review of systems was negative except for: Gastrointestinal:  positive for mild incisional pain  Blood pressure (!) 169/82, pulse 69, temperature 98 F (36.7 C), height _0  (1.575 m), weight 138 lb (62.6 kg), SpO2 95 %. Physical Exam Vitals reviewed.  Constitutional:      Appearance: Gabrielle [name] is normal weight.  HENT:     Head: Normocephalic.     Nose: Nose normal.     Mouth/Throat:     Mouth: Mucous membranes are moist.  Eyes:     Pupils: Pupils are equal, round, and reactive to light.  Cardiovascular:     Rate and Rhythm: Normal rate and regular rhythm.  Pulmonary:     Effort: Pulmonary effort is normal.     Breath sounds: Normal breath sounds.  Abdominal:     General: There is no distension.  Palpations: Abdomen is soft.     Tenderness: There is no abdominal tenderness.     Hernia: No hernia is present.     Comments: Staples removed, steri strips placed  Musculoskeletal:        General: No swelling. Normal range of motion.     Cervical back: Normal range of motion. No rigidity.  Skin:    General: Skin is warm and dry.  Neurological:     General: No focal deficit present.     Mental Status: Gabrielle [name] is alert and oriented to person, place, and time.  Psychiatric:        Mood and Affect: Mood normal.        Behavior: Behavior normal.        Thought Content: Thought content normal.        Judgment: Judgment normal.     Results: Results for orders placed or performed in visit on 04/20/20 (from the past 48 hour(s))  CMP14+EGFR     Status: Abnormal   Collection Time: 04/20/20  4:32 PM  Result Value Ref Range   Glucose 89 65 - 99 mg/dL   BUN 15 8 - 27 mg/dL   Creatinine, Ser 1.43 (H) 0.57 - 1.00 mg/dL   GFR calc non Af Amer 36 (L) >59 mL/min/1.73   GFR calc Af Amer 42 (L) >59 mL/min/1.73    Comment: **[facility] currently reports eGFR in compliance with the current**   recommendations of the [employer]. [facility] will   update reporting as new guidelines are published from the NKF-ASN   Task force.    BUN/Creatinine  Ratio 10 (L) 12 - 28   Sodium 139 134 - 144 mmol/L   Potassium 3.1 (L) 3.5 - 5.2 mmol/L   Chloride 96 96 - 106 mmol/L   CO2 30 (H) 20 - 29 mmol/L   Calcium 8.7 8.7 - 10.3 mg/dL   Total Protein 6.0 6.0 - 8.5 g/dL   Albumin 3.3 (L) 3.7 - 4.7 g/dL   Globulin, Total 2.7 1.5 - 4.5 g/dL   Albumin/Globulin Ratio 1.2 1.2 - 2.2   Bilirubin Total <0.2 0.0 - 1.2 mg/dL   Alkaline Phosphatase 86 48 - 121 IU/L    Comment:               **Please note reference interval change**   AST 10 0 - 40 IU/L   ALT 5 0 - 32 IU/L  CBC with Differential/Platelet     Status: Abnormal   Collection Time: 04/20/20  4:32 PM  Result Value Ref Range   WBC 6.1 3.4 - 10.8 x10E3/uL   RBC 2.96 (L) 3.77 - 5.28 x10E6/uL   Hemoglobin 7.9 (LL) 11.1 - 15.9 g/dL    Comment:                   Client Requested Flag   Hematocrit 25.4 (L) 34.0 - 46.6 %   MCV 86 79 - 97 fL   MCH 26.7 26.6 - 33.0 pg   MCHC 31.1 (L) 31.5 - 35.7 g/dL   RDW 18.5 (H) 11.7 - 15.4 %   Platelets 247 150 - 450 x10E3/uL   Neutrophils 61 Not Estab. %   Lymphs 24 Not Estab. %   Monocytes 11 Not Estab. %   Eos 3 Not Estab. %   Basos 1 Not Estab. %   Neutrophils Absolute 3.7 1.4 - 7.0 x10E3/uL   Lymphocytes Absolute 1.5 0.7 - 3.1 x10E3/uL   Monocytes Absolute 0.6  0.1 - 0.9 x10E3/uL   EOS (ABSOLUTE) 0.2 0.0 - 0.4 x10E3/uL   Basophils Absolute 0.0 0.0 - 0.2 x10E3/uL   Immature Granulocytes 0 Not Estab. %   Immature Grans (Abs) 0.0 0.0 - 0.1 x10E3/uL      Assessment & Plan:  GITTEL MCCAMISH is a 74 y.o. female with Stage III colon cancer. Doing well overall. Gabrielle [name] is feeling well and eating and having BMs. Gabrielle [name] sees Dr. Delton Coombes next week.   -Discussed port placement and risk of bleeding, infection, malfunction, pneumothorax. Gabrielle [name] plans to proceed with treatment and we can get a port in place as soon as needed.   Strips will fall off in the next 5-7 days. Leave them on until then or until they are peeling. No heavy lifting > 10 lbs, excessive bending,  pushing, pulling, squatting for 6- 8 weeks after surgery.  All questions were answered to the satisfaction of the patient and family.   Virl Cagey 04/22/2020, 7:33 AM

## 2020-04-21 NOTE — Patient Instructions (Signed)
Strips will fall off in the next 5-7 days. Leave them on until then or until they are peeling. No heavy lifting > 10 lbs, excessive bending, pushing, pulling, squatting for 6- 8 weeks after surgery.   Implanted Port Insertion Implanted port insertion is a procedure to put in a port and catheter. The port is a device with an injectable disk that can be accessed by your health care provider. The port is connected to a vein in the chest or neck by a small flexible tube (catheter). There are different types of ports. The implanted port may be used as a long-term IV access for:  Medicines, such as chemotherapy.  Fluids.  Liquid nutrition, such as total parenteral nutrition (TPN). When you have a port, this means that your health care provider will not need to use the veins in your arms for these procedures. Tell a health care provider about:  Any allergies you have.  All medicines you are taking, especially blood thinners, as well as any vitamins, herbs, eye drops, creams, over-the-counter medicines, and steroids.  Any problems you or family members have had with anesthetic medicines.  Any blood disorders you have.  Any surgeries you have had.  Any medical conditions you have or have had, including diabetes or kidney problems.  Whether you are pregnant or may be pregnant. What are the risks? Generally, this is a safe procedure. However, problems may occur, including:  Allergic reactions to medicines or dyes.  Damage to other structures or organs.  Infection.  Damage to the blood vessel, bruising, or bleeding at the puncture site.  Blood clot.  Breakdown of the skin over the port.  A collection of air in the chest that can cause one of the lungs to collapse (pneumothorax). This is rare. What happens before the procedure? Medicines  Ask your health care provider about: ? Changing or stopping your regular medicines. This is especially important if you are taking diabetes  medicines or blood thinners. ? Taking medicines such as aspirin and ibuprofen. These medicines can thin your blood. Do not take these medicines unless your health care provider tells you to take them. ? Taking over-the-counter medicines, vitamins, herbs, and supplements. General instructions  Plan to have someone take you home from the hospital or clinic.  If you will be going home right after the procedure, plan to have someone with you for 24 hours.  You may have blood tests.  Do not use any products that contain nicotine or tobacco for at least 4-6 weeks before the procedure. These products include cigarettes, e-cigarettes, and chewing tobacco. If you need help quitting, ask your health care provider.  Ask your health care provider what steps will be taken to help prevent infection. These may include: ? Removing hair at the surgery site. ? Washing skin with a germ-killing soap. ? Taking antibiotic medicine. What happens during the procedure?   An IV will be inserted into one of your veins.  You will be given one or more of the following: ? A medicine to help you relax (sedative). ? A medicine to numb the area (local anesthetic).  Two small incisions will be made to insert the port. ? One smaller incision will be made in your neck to get access to the vein where the catheter will lie. ? The other incision will be made in the upper chest. This is where the port will lie.  The procedure may be done using continuous X-ray (fluoroscopy) or other imaging tools for  guidance.  The port and catheter will be placed. There may be a small, raised area where the port is.  The port will be flushed with a salt solution (saline), and blood will be drawn to make sure that it is working correctly.  The incisions will be closed.  Bandages (dressings) may be placed over the incisions. The procedure may vary among health care providers and hospitals. What happens after the procedure?  Your  blood pressure, heart rate, breathing rate, and blood oxygen level will be monitored until you leave the hospital or clinic.  Do not drive for 24 hours if you were given a sedative during your procedure.  You will be given a manufacturer's information card for the type of port that you have. Keep this with you.  Your port will need to be flushed and checked as told by your health care provider, usually every few weeks.  A chest X-ray will be done to: ? Check the placement of the port. ? Make sure there is no injury to your lung. Summary  Implanted port insertion is a procedure to put in a port and catheter.  The implanted port is used as a long-term IV access.  The port will need to be flushed and checked as told by your health care provider, usually every few weeks.  Keep your manufacturer's information card with you at all times. This information is not intended to replace advice given to you by your health care provider. Make sure you discuss any questions you have with your health care provider. Document Revised: 03/15/2019 Document Reviewed: 06/19/2018 Elsevier Patient Education  [place].

## 2020-04-22 DIAGNOSIS — I708 Atherosclerosis of other arteries: Secondary | ICD-10-CM | POA: Diagnosis not present

## 2020-04-22 DIAGNOSIS — D630 Anemia in neoplastic disease: Secondary | ICD-10-CM | POA: Diagnosis not present

## 2020-04-22 DIAGNOSIS — C182 Malignant neoplasm of ascending colon: Secondary | ICD-10-CM | POA: Diagnosis not present

## 2020-04-22 DIAGNOSIS — I1310 Hypertensive heart and chronic kidney disease without heart failure, with stage 1 through stage 4 chronic kidney disease, or unspecified chronic kidney disease: Secondary | ICD-10-CM | POA: Diagnosis not present

## 2020-04-22 DIAGNOSIS — I088 Other rheumatic multiple valve diseases: Secondary | ICD-10-CM | POA: Diagnosis not present

## 2020-04-22 DIAGNOSIS — Z483 Aftercare following surgery for neoplasm: Secondary | ICD-10-CM | POA: Diagnosis not present

## 2020-04-22 DIAGNOSIS — D631 Anemia in chronic kidney disease: Secondary | ICD-10-CM | POA: Diagnosis not present

## 2020-04-22 DIAGNOSIS — I700 Atherosclerosis of aorta: Secondary | ICD-10-CM | POA: Diagnosis not present

## 2020-04-22 DIAGNOSIS — N1832 Chronic kidney disease, stage 3b: Secondary | ICD-10-CM | POA: Diagnosis not present

## 2020-04-23 DIAGNOSIS — D631 Anemia in chronic kidney disease: Secondary | ICD-10-CM | POA: Diagnosis not present

## 2020-04-23 DIAGNOSIS — C182 Malignant neoplasm of ascending colon: Secondary | ICD-10-CM | POA: Diagnosis not present

## 2020-04-23 DIAGNOSIS — I1310 Hypertensive heart and chronic kidney disease without heart failure, with stage 1 through stage 4 chronic kidney disease, or unspecified chronic kidney disease: Secondary | ICD-10-CM | POA: Diagnosis not present

## 2020-04-23 DIAGNOSIS — I088 Other rheumatic multiple valve diseases: Secondary | ICD-10-CM | POA: Diagnosis not present

## 2020-04-23 DIAGNOSIS — I700 Atherosclerosis of aorta: Secondary | ICD-10-CM | POA: Diagnosis not present

## 2020-04-23 DIAGNOSIS — N1832 Chronic kidney disease, stage 3b: Secondary | ICD-10-CM | POA: Diagnosis not present

## 2020-04-23 DIAGNOSIS — D630 Anemia in neoplastic disease: Secondary | ICD-10-CM | POA: Diagnosis not present

## 2020-04-23 DIAGNOSIS — I708 Atherosclerosis of other arteries: Secondary | ICD-10-CM | POA: Diagnosis not present

## 2020-04-23 DIAGNOSIS — Z483 Aftercare following surgery for neoplasm: Secondary | ICD-10-CM | POA: Diagnosis not present

## 2020-04-24 ENCOUNTER — Ambulatory Visit (INDEPENDENT_AMBULATORY_CARE_PROVIDER_SITE_OTHER): Payer: Medicare HMO

## 2020-04-24 ENCOUNTER — Other Ambulatory Visit: Payer: Self-pay

## 2020-04-24 DIAGNOSIS — Z9049 Acquired absence of other specified parts of digestive tract: Secondary | ICD-10-CM

## 2020-04-24 DIAGNOSIS — I708 Atherosclerosis of other arteries: Secondary | ICD-10-CM | POA: Diagnosis not present

## 2020-04-24 DIAGNOSIS — Z87440 Personal history of urinary (tract) infections: Secondary | ICD-10-CM

## 2020-04-24 DIAGNOSIS — N1832 Chronic kidney disease, stage 3b: Secondary | ICD-10-CM | POA: Diagnosis not present

## 2020-04-24 DIAGNOSIS — D630 Anemia in neoplastic disease: Secondary | ICD-10-CM | POA: Diagnosis not present

## 2020-04-24 DIAGNOSIS — C182 Malignant neoplasm of ascending colon: Secondary | ICD-10-CM

## 2020-04-24 DIAGNOSIS — E785 Hyperlipidemia, unspecified: Secondary | ICD-10-CM

## 2020-04-24 DIAGNOSIS — D631 Anemia in chronic kidney disease: Secondary | ICD-10-CM | POA: Diagnosis not present

## 2020-04-24 DIAGNOSIS — Z483 Aftercare following surgery for neoplasm: Secondary | ICD-10-CM

## 2020-04-24 DIAGNOSIS — K5900 Constipation, unspecified: Secondary | ICD-10-CM

## 2020-04-24 DIAGNOSIS — I1310 Hypertensive heart and chronic kidney disease without heart failure, with stage 1 through stage 4 chronic kidney disease, or unspecified chronic kidney disease: Secondary | ICD-10-CM | POA: Diagnosis not present

## 2020-04-24 DIAGNOSIS — Z9181 History of falling: Secondary | ICD-10-CM

## 2020-04-24 DIAGNOSIS — I700 Atherosclerosis of aorta: Secondary | ICD-10-CM | POA: Diagnosis not present

## 2020-04-24 DIAGNOSIS — I088 Other rheumatic multiple valve diseases: Secondary | ICD-10-CM | POA: Diagnosis not present

## 2020-04-24 DIAGNOSIS — D509 Iron deficiency anemia, unspecified: Secondary | ICD-10-CM

## 2020-04-24 DIAGNOSIS — E876 Hypokalemia: Secondary | ICD-10-CM

## 2020-04-24 DIAGNOSIS — H353290 Exudative age-related macular degeneration, unspecified eye, stage unspecified: Secondary | ICD-10-CM

## 2020-04-27 ENCOUNTER — Other Ambulatory Visit: Payer: Self-pay

## 2020-04-27 ENCOUNTER — Inpatient Hospital Stay (HOSPITAL_COMMUNITY): Payer: Medicare HMO | Attending: Hematology | Admitting: Hematology

## 2020-04-27 VITALS — BP 146/76 | HR 77 | Temp 98.2°F | Resp 18 | Wt 134.0 lb

## 2020-04-27 DIAGNOSIS — D630 Anemia in neoplastic disease: Secondary | ICD-10-CM | POA: Diagnosis not present

## 2020-04-27 DIAGNOSIS — I700 Atherosclerosis of aorta: Secondary | ICD-10-CM | POA: Diagnosis not present

## 2020-04-27 DIAGNOSIS — I1310 Hypertensive heart and chronic kidney disease without heart failure, with stage 1 through stage 4 chronic kidney disease, or unspecified chronic kidney disease: Secondary | ICD-10-CM | POA: Diagnosis not present

## 2020-04-27 DIAGNOSIS — I708 Atherosclerosis of other arteries: Secondary | ICD-10-CM | POA: Diagnosis not present

## 2020-04-27 DIAGNOSIS — D631 Anemia in chronic kidney disease: Secondary | ICD-10-CM | POA: Diagnosis not present

## 2020-04-27 DIAGNOSIS — N189 Chronic kidney disease, unspecified: Secondary | ICD-10-CM | POA: Diagnosis not present

## 2020-04-27 DIAGNOSIS — I088 Other rheumatic multiple valve diseases: Secondary | ICD-10-CM | POA: Diagnosis not present

## 2020-04-27 DIAGNOSIS — Z483 Aftercare following surgery for neoplasm: Secondary | ICD-10-CM | POA: Diagnosis not present

## 2020-04-27 DIAGNOSIS — C182 Malignant neoplasm of ascending colon: Secondary | ICD-10-CM | POA: Diagnosis not present

## 2020-04-27 DIAGNOSIS — D509 Iron deficiency anemia, unspecified: Secondary | ICD-10-CM | POA: Diagnosis not present

## 2020-04-27 DIAGNOSIS — N1832 Chronic kidney disease, stage 3b: Secondary | ICD-10-CM | POA: Diagnosis not present

## 2020-04-27 NOTE — Patient Instructions (Addendum)
[place] [facility] Discharge Instructions  You were seen today by Dr. Delton Coombes. He went over your recent test results. You have a stage 3 colon cancer. He reviewed your recent history, family history and how you've been feeling since your recent hospital stay. You will need a PET scan to further evaluate your cancer. You will need a port-a-cath placed to receive treatments, he will get you scheduled for that. He will see you back after your scan for labs and follow up.   Thank you for choosing [place] at [facility] to provide your oncology and hematology care.  To afford each patient quality time with our provider, please arrive at least 15 minutes before your scheduled appointment time.   If you have a lab appointment with the [facility] please come in thru the  Main Entrance and check in at the main information desk  You need to re-schedule your appointment should you arrive 10 or more minutes late.  We strive to give you quality time with our providers, and arriving late affects you and other patients whose appointments are after yours.  Also, if you no show three or more times for appointments you may be dismissed from the clinic at the providers discretion.     Again, thank you for choosing [facility].  Our hope is that these requests will decrease the amount of time that you wait before being seen by our physicians.       _____________________________________________________________  Should you have questions after your visit to [facility], please contact our office at [phone] between the hours of 8:00 a.m. and 4:30 p.m.  Voicemails left after 4:00 p.m. will not be returned until the following business day.  For prescription refill requests, have your pharmacy contact our office and allow 72 hours.    Cancer Center Support Programs:   > Cancer Support Group  2nd Tuesday of the month 1pm-2pm,  Journey Room

## 2020-04-27 NOTE — Progress Notes (Signed)
 [facility] [address] [place], [place] [address]   CLINIC:  Medical Oncology/Hematology  PCP:  Gottschalk, Ashly M, DO [address] [place] [address] [phone]   REASON FOR VISIT:  Follow-up for right-sided colon cancer.  PRIOR THERAPY: Right hemicolectomy on 04/08/2020.  NGS Results: Being ordered.  CURRENT THERAPY: Being planned.  BRIEF ONCOLOGIC HISTORY:  Oncology History   No history exists.    CANCER STAGING: Cancer Staging Malignant neoplasm of ascending colon (HCC) Staging form: Colon and Rectum, AJCC 8th Edition - Clinical stage from 04/27/2020: Stage IIIB (cT4a, cN1a, cM0) - Unsigned    INTERVAL HISTORY:  [name] 74 y.o. female seen for follow-up of right-sided colon cancer.  She underwent right hemicolectomy on 04/08/2020.  She is currently living with her daughter.  She is walking well every day.  Reports improvement in her energy levels.  Denies any new onset pains.    REVIEW OF SYSTEMS:  Review of Systems  All other systems reviewed and are negative.    PAST MEDICAL/SURGICAL HISTORY:  Past Medical History:  Diagnosis Date  . Cataract    OU  . H/O cesarean section   . Hx of tonsillectomy   . Hyperlipidemia   . Hypertension   . Macular degeneration    Exu ARMD OU   Past Surgical History:  Procedure Laterality Date  . BIOPSY  04/06/2020   Procedure: BIOPSY;  Surgeon: Rehman, Najeeb U, MD;  Location: [facility];  Service: Endoscopy;;  . CARPAL TUNNEL RELEASE Right   . CATARACT EXTRACTION W/PHACO Left 10/11/2019   Procedure: CATARACT EXTRACTION PHACO AND INTRAOCULAR LENS PLACEMENT (IOC);  Surgeon: Wrzosek, James, MD;  Location: [facility] ORS;  Service: Ophthalmology;  Laterality: Left;  CDE: 8.56  . CATARACT EXTRACTION W/PHACO Right 10/25/2019   Procedure: CATARACT EXTRACTION PHACO AND INTRAOCULAR LENS PLACEMENT (IOC);  Surgeon: Wrzosek, James, MD;  Location: [facility] ORS;  Service: Ophthalmology;  Laterality: Right;  CDE: 5.67  .  CESAREAN SECTION    . COLONOSCOPY N/A 04/06/2020   Procedure: COLONOSCOPY;  Surgeon: Rehman, Najeeb U, MD;  Location: [facility];  Service: Endoscopy;  Laterality: N/A;  . CYSTOSCOPY WITH BIOPSY N/A 04/08/2020   Procedure: CYSTOSCOPY WITH BIOPSY;  Surgeon: McKenzie, Patrick L, MD;  Location: [facility] OR[facility];  Service: Urology;  Laterality: N/A;  . ESOPHAGOGASTRODUODENOSCOPY N/A 04/05/2020   Procedure: ESOPHAGOGASTRODUODENOSCOPY (EGD);  Surgeon: Rehman, Najeeb U, MD;  [facility];  Service: Endoscopy;  Laterality: N/A;  . PARTIAL COLECTOMY N/A 04/08/2020   Procedure: PARTIAL COLECTOMY;  Surgeon: Bridges, Lindsay C, MD;  Location: [facility] OR[facility];  Service: General;  Laterality: N/A;  . TONSILLECTOMY       SOCIAL HISTORY:  Social History   Socioeconomic History  . Marital status: Divorced    Spouse name: Not on file  . Number of children: Not on file  . Years of education: Not on file  . Highest education level: Not on file  Occupational History  . Not on file  Tobacco Use  . Smoking status: Never Smoker  . Smokeless tobacco: Never Used  Substance and Sexual Activity  . Alcohol use: No  . Drug use: No  . Sexual activity: Not Currently  Other Topics Concern  . Not on file  Social History Narrative  . Not on file   Social Determinants of Health   Financial Resource Strain:   . Difficulty of Paying Living Expenses:   Food Insecurity:   . Worried About Running   Out of Food in the Last Year:   . Ran Out of Food in the Last Year:   Transportation Needs:   . Lack of Transportation (Medical):   . Lack of Transportation (Non-Medical):   Physical Activity:   . Days of Exercise per Week:   . Minutes of Exercise per Session:   Stress:   . Feeling of Stress :   Social Connections:   . Frequency of Communication with Friends and Family:   . Frequency of Social Gatherings with Friends and Family:   . Attends Religious Services:   . Active Member of Clubs or Organizations:   . Attends Club  or Organization Meetings:   . Marital Status:   Intimate Partner Violence:   . Fear of Current or Ex-Partner:   . Emotionally Abused:   . Physically Abused:   . Sexually Abused:     FAMILY HISTORY:  Family History  Problem Relation Age of Onset  . Macular degeneration Mother   . Stroke Mother   . Hypertension Mother   . Dementia Mother   . Cancer Father        lung  . Diabetes Sister   . Hypertension Sister     CURRENT MEDICATIONS:  Outpatient Encounter Medications as of 04/27/2020  Medication Sig  . alendronate (FOSAMAX) 70 MG tablet Take 1 tablet (70 mg total) by mouth every 7 (seven) days. Take with a full glass of water on an empty stomach.  . atorvastatin (LIPITOR) 20 MG tablet Take 1 tablet (20 mg total) by mouth daily.  . cholecalciferol (VITAMIN D3) 25 MCG (1000 UT) tablet Take 1,000 Units by mouth daily.  . diclofenac Sodium (VOLTAREN) 1 % GEL Apply 4 g topically 4 (four) times daily.  . dorzolamide-timolol (COSOPT) 22.3-6.8 MG/ML ophthalmic solution Place 1 drop into both eyes daily.  . ferrous sulfate 325 (65 FE) MG EC tablet Take 1 tablet (325 mg total) by mouth 2 (two) times daily with a meal.  . losartan-hydrochlorothiazide (HYZAAR) 100-25 MG tablet TAKE 1 TABLET DAILY.  Needs to be seen for further refills.  . potassium chloride SA (KLOR-CON) 20 MEQ tablet Take 1 tablet (20 mEq total) by mouth daily.  . acetaminophen (TYLENOL) 500 MG tablet Take 1,000 mg by mouth every 6 (six) hours as needed.   No facility-administered encounter medications on file as of 04/27/2020.    ALLERGIES:  Allergies  Allergen Reactions  . Lisinopril Cough    Pt states she is not allergic to lisinopril     PHYSICAL EXAM:  ECOG Performance status: 1  Vitals:   04/27/20 1358  BP: (!) 146/76  Pulse: 77  Resp: 18  Temp: 98.2 F (36.8 C)  SpO2: 98%   Filed Weights   04/27/20 1358  Weight: 134 lb (60.8 kg)   Physical Exam Vitals reviewed.  Constitutional:       Appearance: Normal appearance.  HENT:     Head: Normocephalic.  Neurological:     General: No focal deficit present.     Mental Status: She is alert and oriented to person, place, and time.  Psychiatric:        Mood and Affect: Mood normal.        Behavior: Behavior normal.      LABORATORY DATA:  I have reviewed the labs as listed.  CBC    Component Value Date/Time   WBC 6.1 04/20/2020 1632   WBC 5.3 04/12/2020 0649   RBC 2.96 (L) 04/20/2020 1632   RBC   2.87 (L) 04/12/2020 0649   HGB 7.9 (LL) 04/20/2020 1632   HCT 25.4 (L) 04/20/2020 1632   PLT 247 04/20/2020 1632   MCV 86 04/20/2020 1632   MCH 26.7 04/20/2020 1632   MCH 26.1 04/12/2020 0649   MCHC 31.1 (L) 04/20/2020 1632   MCHC 29.5 (L) 04/12/2020 0649   RDW 18.5 (H) 04/20/2020 1632   LYMPHSABS 1.5 04/20/2020 1632   MONOABS 0.4 04/12/2020 0649   EOSABS 0.2 04/20/2020 1632   BASOSABS 0.0 04/20/2020 1632   CMP Latest Ref Rng & Units 04/20/2020 04/12/2020 04/11/2020  Glucose 65 - 99 mg/dL 89 92 86  BUN 8 - 27 mg/dL _0 Creatinine 0.57 - 1.00 mg/dL 1.43(H) 0.93 1.03(H)  Sodium 134 - 144 mmol/L 139 138 136  Potassium 3.5 - 5.2 mmol/L 3.1(L) 4.3 4.3  Chloride 96 - 106 mmol/L 96 108 106  CO2 20 - 29 mmol/L 30(H) 24 24  Calcium 8.7 - 10.3 mg/dL 8.7 7.8(L) 8.1(L)  Total Protein 6.0 - 8.5 g/dL 6.0 - 5.0(L)  Total Bilirubin 0.0 - 1.2 mg/dL <0.2 - 0.4  Alkaline Phos 48 - 121 IU/L 86 - 50  AST 0 - 40 IU/L 10 - 9(L)  ALT 0 - 32 IU/L 5 - 7    DIAGNOSTIC IMAGING:  I have independently reviewed the scans and discussed with the patient.  ASSESSMENT & PLAN:  Malignant neoplasm of ascending colon (HCC) 1.  Stage IIIb (T4AN1A) poorly differentiated right colon adenocarcinoma: -Right hemicolectomy on 04/08/2020 with poorly differentiated adenocarcinoma, pT4a, positive radial margin, 1/15 lymph nodes positive.  Loss of MLH1 and PMS2.  Bladder biopsy was negative for malignancy. -CTAP on 04/06/2020 showed circumferential ascending  colon mass with numerous borderline enlarged pericolonic lymph nodes.  No findings for hepatic metastatic disease.  Severely thick walled bladder consistent with severe chronic cystitis. -She is recovering well from surgery. -CEA on 04/04/2020 was 12.2. -Based on her positive margin and poorly differentiated grade, I have recommended a PET CT scan.  We will also repeat CEA level in the next 1 to 2 weeks. -I have recommended 6 months of adjuvant chemotherapy.  We will request port placement by Dr. Constance Haw. -I will also consider NGS testing.  We will also request BRAF mutation testing and MLH1 methylation testing.  We will see her back after the port placement and PET scan.  2.  Normocytic anemia: -This is from combination of CKD and iron deficiency. -Iron panel on 03/25/2020 shows ferritin 17, saturation 5.  Folic acid and D17 was normal. -We will also consider parenteral iron therapy with Feraheme weekly x2.  3.  Family history: -Paternal uncle had colon cancer.  Maternal aunt had brain cancer and another maternal aunt had gynecological malignancy.  Father had lung cancer and was a smoker.  We will consider genetic testing.  4.  Diffuse erythema nodularity of the dome of the bladder: -CT scan showed severely thickened bladder wall. -Cystoscopy on 04/08/2020 showed diffuse erythema nodularity in the posterior wall tracking to the dome.  Ureteral orifices were in the normal locations.  Biopsies were benign.     Orders placed this encounter:  Orders Placed This Encounter  Procedures  . NM PET Image Initial (PI) Skull Base To Thigh  . Iron and TIBC  . Ferritin  . Vitamin B12  . Folate  . CEA      Derek Jack, MD [place] [phone]

## 2020-04-27 NOTE — Assessment & Plan Note (Addendum)
1.  Stage IIIb (T4AN1A) poorly differentiated right colon adenocarcinoma: -Right hemicolectomy on 04/08/2020 with poorly differentiated adenocarcinoma, pT4a, positive radial margin, 1/15 lymph nodes positive.  Loss of MLH1 and PMS2.  Bladder biopsy was negative for malignancy. -CTAP on 04/06/2020 showed circumferential ascending colon mass with numerous borderline enlarged pericolonic lymph nodes.  No findings for hepatic metastatic disease.  Severely thick walled bladder consistent with severe chronic cystitis. -She is recovering well from surgery. -CEA on 04/04/2020 was 12.2. -Based on her positive margin and poorly differentiated grade, I have recommended a PET CT scan.  We will also repeat CEA level in the next 1 to 2 weeks. -I have recommended 6 months of adjuvant chemotherapy.  We will request port placement by Dr. Constance Haw. -I will also consider NGS testing.  We will also request BRAF mutation testing and MLH1 methylation testing.  We will see her back after the port placement and PET scan.  2.  Normocytic anemia: -This is from combination of CKD and iron deficiency. -Iron panel on 03/25/2020 shows ferritin 17, saturation 5.  Folic acid and I96 was normal. -We will also consider parenteral iron therapy with Feraheme weekly x2.  3.  Family history: -Paternal uncle had colon cancer.  Maternal aunt had brain cancer and another maternal aunt had gynecological malignancy.  Father had lung cancer and was a smoker.  We will consider genetic testing.  4.  Diffuse erythema nodularity of the dome of the bladder: -CT scan showed severely thickened bladder wall. -Cystoscopy on 04/08/2020 showed diffuse erythema nodularity in the posterior wall tracking to the dome.  Ureteral orifices were in the normal locations.  Biopsies were benign.

## 2020-04-27 NOTE — Progress Notes (Signed)
[facility] Note  04/30/2020     CHIEF COMPLAINT Patient presents for Retina Follow Up   HISTORY OF PRESENT ILLNESS: Gabrielle [name] is a 74 y.o. female who presents to the clinic today for:   HPI    Retina Follow Up    Patient presents with  Wet AMD.  In both eyes.  This started months ago.  Severity is moderate.  Duration of 8 weeks.  Since onset it is gradually worsening.  I, the attending physician,  performed the HPI with the patient and updated documentation appropriately.          Comments    74 y/o female pt here for 8 wk f/u for exu ARMD OU.  Pt feels VA OU may be a bit worse.  Denies pain, FOL, floaters.  Cosopt QD OU.       Last edited by Bernarda Caffey, MD on 04/30/2020  8:38 AM. (History)    pt was recently dx with colon cancer and had a hemicolectomy on May 5  Referring physician: Janora Norlander, DO [place],  [place] [address]  HISTORICAL INFORMATION:   Selected notes from the MEDICAL RECORD NUMBER Referred by Dr. Cristela Blue for concern of ARMD OU;  LEE- 01.03.19 Cristela Blue) [[facility] OD: 10/50 OS: 20/80] Ocular Hx- ARMD OU, DME PMH- elevated chol., HTN    CURRENT MEDICATIONS: Current Outpatient Medications (Ophthalmic Drugs)  Medication Sig  . dorzolamide-timolol (COSOPT) 22.3-6.8 MG/ML ophthalmic solution Place 1 drop into both eyes daily.   No current facility-administered medications for this visit. (Ophthalmic Drugs)   Current Outpatient Medications (Other)  Medication Sig  . acetaminophen (TYLENOL) 500 MG tablet Take 1,000 mg by mouth every 6 (six) hours as needed.  Marland Kitchen alendronate (FOSAMAX) 70 MG tablet Take 1 tablet (70 mg total) by mouth every 7 (seven) days. Take with a full glass of water on an empty stomach.  Marland Kitchen atorvastatin (LIPITOR) 20 MG tablet Take 1 tablet (20 mg total) by mouth daily.  . cholecalciferol (VITAMIN D3) 25 MCG (1000 UT) tablet Take 1,000 Units by mouth daily.  . diclofenac Sodium (VOLTAREN)  1 % GEL Apply 4 g topically 4 (four) times daily.  . ferrous sulfate 325 (65 FE) MG EC tablet Take 1 tablet (325 mg total) by mouth 2 (two) times daily with a meal.  . losartan-hydrochlorothiazide (HYZAAR) 100-25 MG tablet TAKE 1 TABLET DAILY.  Needs to be seen for further refills.  . potassium chloride SA (KLOR-CON) 20 MEQ tablet Take 1 tablet (20 mEq total) by mouth daily.   No current facility-administered medications for this visit. (Other)      REVIEW OF SYSTEMS: ROS    Positive for: Gastrointestinal, Musculoskeletal, Eyes   Negative for: Constitutional, Neurological, Skin, Genitourinary, HENT, Endocrine, Cardiovascular, Respiratory, Psychiatric, Allergic/Imm, Heme/Lymph   Last edited by Matthew Folks, COA on 04/30/2020  8:09 AM. (History)       ALLERGIES Allergies  Allergen Reactions  . Lisinopril Cough    Pt states she is not allergic to lisinopril    PAST MEDICAL HISTORY Past Medical History:  Diagnosis Date  . Cataract    OU  . H/O cesarean section   . Hx of tonsillectomy   . Hyperlipidemia   . Hypertension   . Macular degeneration    Exu ARMD OU   Past Surgical History:  Procedure Laterality Date  . BIOPSY  04/06/2020   Procedure: BIOPSY;  Surgeon: Hildred Laser  U, MD;  [facility]: [facility];  Service: Endoscopy;;  . CARPAL TUNNEL RELEASE Right   . CATARACT EXTRACTION W/PHACO Left 10/11/2019   Procedure: CATARACT EXTRACTION PHACO AND INTRAOCULAR LENS PLACEMENT (IOC);  Surgeon: Baruch Goldmann, MD;  Location: [facility];  Service: Ophthalmology;  Laterality: Left;  CDE: 8.56  . CATARACT EXTRACTION W/PHACO Right 10/25/2019   Procedure: CATARACT EXTRACTION PHACO AND INTRAOCULAR LENS PLACEMENT (IOC);  Surgeon: Baruch Goldmann, MD;  Location: [facility];  Service: Ophthalmology;  Laterality: Right;  CDE: 5.67  . CESAREAN SECTION    . COLONOSCOPY N/A 04/06/2020   Procedure: COLONOSCOPY;  Surgeon: Rogene Houston, MD;  Location: [facility];  Service: Endoscopy;   Laterality: N/A;  . CYSTOSCOPY WITH BIOPSY N/A 04/08/2020   Procedure: CYSTOSCOPY WITH BIOPSY;  Surgeon: Cleon Gustin, MD;  Location: [facility];  Service: Urology;  Laterality: N/A;  . ESOPHAGOGASTRODUODENOSCOPY N/A 04/05/2020   Procedure: ESOPHAGOGASTRODUODENOSCOPY (EGD);  Surgeon: Rogene Houston, MD;  [facility];  Service: Endoscopy;  Laterality: N/A;  . PARTIAL COLECTOMY N/A 04/08/2020   Procedure: PARTIAL COLECTOMY;  Surgeon: Virl Cagey, MD;  Location: [facility];  Service: General;  Laterality: N/A;  . TONSILLECTOMY      FAMILY HISTORY Family History  Problem Relation Age of Onset  . Macular degeneration Mother   . Stroke Mother   . Hypertension Mother   . Dementia Mother   . Cancer Father        lung  . Diabetes Sister   . Hypertension Sister     SOCIAL HISTORY Social History   Tobacco Use  . Smoking status: Never Smoker  . Smokeless tobacco: Never Used  Substance Use Topics  . Alcohol use: No  . Drug use: No         OPHTHALMIC EXAM:  Base Eye Exam    Visual Acuity (Snellen - Linear)      Right Left   Dist cc 20/150 -2 20/80 -2   Dist ph cc NI 20/80 -   Correction: Glasses       Tonometry (Tonopen, 8:12 AM)      Right Left   Pressure 14 14       Pupils      Dark Light Shape React APD   Right 3 2 Round Slow None   Left 3 2 Round Slow None       Visual Fields (Counting fingers)      Left Right    Full Full       Extraocular Movement      Right Left    Full, Ortho Full, Ortho       Neuro/Psych    Oriented x3: Yes   Mood/Affect: Normal       Dilation    Both eyes: 1.0% Mydriacyl, 2.5% Phenylephrine @ 8:13 AM        Slit Lamp and Fundus Exam    Slit Lamp Exam      Right Left   Lids/Lashes Dermatochalasis - upper lid Dermatochalasis - upper lid   Conjunctiva/Sclera White and quiet White and quiet   Cornea Arcus, 1-2+ Punctate epithelial erosions Arcus, 1-2+ Punctate epithelial erosions   Anterior Chamber Deep and  quiet Deep and quiet   Iris Round and well dilated Round and well dilated   Lens PCIOL in good position PCIOL in good position   Vitreous Vitreous syneresis, Posterior vitreous detachment Vitreous syneresis, Posterior vitreous detachment       Fundus Exam  Right Left   Disc pink and sharp, compact pink and sharp, Compact   C/D Ratio 0.2 0.2   Macula Blunted foveal reflex, Drusen, Pigment clumping and atrophy, central thickening/disciform scar, +PED, no heme Blunted foveal reflex, central thickening, RPE clumping and atrophy, Drusen, RPE rip, mild IRF/SRF overlying PED -- persistent   Vessels Vascular attenuation, Tortuous Vascular attenuation, Tortuous   Periphery Attached, mild Reticular degeneration Attached, mild Reticular degeneration          IMAGING AND PROCEDURES  Imaging and Procedures for 03/21/18  OCT, Retina - OU - Both Eyes       Right Eye Quality was good. Central Foveal Thickness: 465. Progression has been stable. Findings include pigment epithelial detachment, epiretinal membrane, subretinal hyper-reflective material, retinal drusen , abnormal foveal contour, no SRF, intraretinal fluid, disciform scar, outer retinal tubulation, outer retinal atrophy (Stable sub-retinal scar, mild interval improvement in IRF).   Left Eye Quality was good. Central Foveal Thickness: 403. Progression has been stable. Findings include subretinal hyper-reflective material, pigment epithelial detachment, intraretinal fluid, retinal drusen , abnormal foveal contour, no SRF, outer retinal atrophy (persistent IRF overlying SRHM/PED).   Notes *Images captured and stored on drive  Diagnosis / Impression:  Exudative ARMD OU OD: Stable sub-retinal scar, minimal cystic changes overlying -- stable OS: tr persistent IRF overlying SRHM/PED  Clinical management:  See below  Abbreviations: NFP - Normal foveal profile. CME - cystoid macular edema. PED - pigment epithelial detachment. IRF -  intraretinal fluid. SRF - subretinal fluid. EZ - ellipsoid zone. ERM - epiretinal membrane. ORA - outer retinal atrophy. ORT - outer retinal tubulation. SRHM - subretinal hyper-reflective material        Intravitreal Injection, Pharmacologic Agent - OD - Right Eye       Time Out 04/30/2020. 8:26 AM. Confirmed correct patient, procedure, site, and patient consented.   Anesthesia Topical anesthesia was used. Anesthetic medications included Lidocaine 2%, Proparacaine 0.5%.   Procedure Preparation included 5% betadine to ocular surface, eyelid speculum. A (33g) needle was used.   Injection:  2 mg aflibercept Alfonse Flavors) SOLN   NDC: A3590391, Lot: [id number], Expiration date: 08/31/2020   Route: Intravitreal, Site: Right Eye, Waste: 0.05 mL  Post-op Post injection exam found visual acuity of at least counting fingers. The patient tolerated the procedure well. There were no complications. The patient received written and verbal post procedure care education.        Intravitreal Injection, Pharmacologic Agent - OS - Left Eye       Time Out 04/30/2020. 8:27 AM. Confirmed correct patient, procedure, site, and patient consented.   Anesthesia Topical anesthesia was used. Anesthetic medications included Lidocaine 2%, Proparacaine 0.5%.   Procedure Preparation included 5% betadine to ocular surface, eyelid speculum. A (33g) needle was used.   Injection:  2 mg aflibercept Alfonse Flavors) SOLN   NDC: A3590391, Lot: [id number], Expiration date: 08/31/2020   Route: Intravitreal, Site: Left Eye, Waste: 0.05 mL  Post-op Post injection exam found visual acuity of at least counting fingers. The patient tolerated the procedure well. There were no complications. The patient received written and verbal post procedure care education.                 ASSESSMENT/PLAN:    ICD-10-CM   1. Exudative age-related macular degeneration of both eyes with active choroidal neovascularization (HCC)   H35.3231 Intravitreal Injection, Pharmacologic Agent - OD - Right Eye    Intravitreal Injection, Pharmacologic Agent - OS - Left Eye  aflibercept (EYLEA) SOLN 2 mg    aflibercept (EYLEA) SOLN 2 mg  2. Retinal edema  H35.81 OCT, Retina - OU - Both Eyes  3. Posterior vitreous detachment of both eyes  H43.813   4. Pseudophakia of both eyes  Z96.1   5. Ocular hypertension, bilateral  H40.053     1,2. Exudative age related macular degeneration, both eyes.    - severe exudative disease with very active CNVM OU at presentation in January 2019  - S/P IVA OD #1 (01.04.19), #2 (02.15.19)  - S/P IVA OS #1 (01.18.19), #2 (02.15.19)  - switched to Eylea 3.18.19 due to severity of disease  - S/P IVE OD #1 (03.18.19), #2 (04.17.19), #3 (05.15.19) -- injections held due to stable disciform scar, #4 (10.02.19),  #5 (01.15.20), #6 (06.04.20), #7 (10.29.20), #8 (01.21.21)  - S/P IVE OS #1 (03.18.19), #2 (04.17.19), #3 (05.15.19), #4 (06.12.19), #5 (07.10.19), #6 (08.07.19), #7 (09.04.19), #8 (10.02.19), #9 (11.06.19), #10 (12.11.19), #11 (01.15.20), #12 (02.20.20), #13 (03.26.20), #14 (04.30.20), #15 (06.04.20), #16 (07.16.20), # 17 (08.20.20), #18 (09.24.20), #19 (10.29.20), #20 (12.03.20), #21 (01.21.21), #22 (03.18.21)  - OCT OS today shows tr persistent IRF overlying PED  - OD with significant subretinal disciform scar - stable improvement in cystic changes  - will plan to treat OD ~q3-5 mos for maintenance/prevention -- last injection 01.21.21  - recommend IVE OU (OD #9 OS #23) today, 05.27.21 -- maintenance  - pt wishes to be treated with IVE OU  - RBA of procedure discussed, questions answered  - informed consent obtained  - see procedure note  - Eylea informed consent form signed and scanned on 01.21.2021  - Eylea4U paperwork filled out on 02.15.19 and fully approved through [employer] -- approved  - will plan on injecting OS q8 wks (2 mos) and OD q16 wks (4 mos)  - f/u in 8 wks for  DFE/OCT/possible injection OS  3. PVD / vitreous syneresis  - Discussed findings and prognosis  - No RT or RD on 360  exam  - Reviewed s/s of RT/RD  - Strict return precautions for any such RT/RD signs/symptoms  4. Pseudophakia OU  - s/p CE/IOL (Dr. Marisa Hua, 11.2020)  - beautiful surgeries w/ IOLs in excellent position, doing well  - monitor  5. Ocular Hypertension OU  - IOP okay today -- 14 OD, 13 OS  - cont Cosopt bid OU -- decrease to Qdaily   Ophthalmic Meds Ordered this visit:  Meds ordered this encounter  Medications  . aflibercept (EYLEA) SOLN 2 mg  . aflibercept (EYLEA) SOLN 2 mg       Return in about 8 weeks (around 06/25/2020) for Dilated Exam, OCT, Possible Injxn.  There are no Patient Instructions on file for this visit.  This document serves as a record of services personally performed by Gardiner Sleeper, MD, PhD. It was created on their behalf by Leeann Must, [place], a certified ophthalmic assistant. The creation of this record is the provider's dictation and/or activities during the visit.    Electronically signed by: Leeann Must, COA @TODAY @ 9:05 AM  Gardiner Sleeper, M.D., Ph.D. Diseases & Surgery of the Retina and Vitreous [facility]  I have reviewed the above documentation for accuracy and completeness, and I agree with the above. Gardiner Sleeper, M.D., Ph.D. 04/30/20 9:05 AM   Abbreviations: M myopia (nearsighted); A astigmatism; H hyperopia (farsighted); P presbyopia; Mrx spectacle prescription;  CTL contact lenses; OD right eye; OS left  eye; OU both eyes  XT exotropia; ET esotropia; PEK punctate epithelial keratitis; PEE punctate epithelial erosions; DES dry eye syndrome; MGD meibomian gland dysfunction; ATs artificial tears; PFAT's preservative free artificial tears; [place] nuclear sclerotic cataract; PSC posterior subcapsular cataract; ERM epi-retinal membrane; PVD posterior vitreous detachment; RD retinal detachment; DM  diabetes mellitus; DR diabetic retinopathy; NPDR non-proliferative diabetic retinopathy; PDR proliferative diabetic retinopathy; CSME clinically significant macular edema; DME diabetic macular edema; dbh dot blot hemorrhages; CWS cotton wool spot; POAG primary open angle glaucoma; C/D cup-to-disc ratio; HVF humphrey visual field; GVF goldmann visual field; OCT optical coherence tomography; IOP intraocular pressure; BRVO Branch retinal vein occlusion; CRVO central retinal vein occlusion; CRAO central retinal artery occlusion; BRAO branch retinal artery occlusion; RT retinal tear; SB scleral buckle; PPV pars plana vitrectomy; VH Vitreous hemorrhage; PRP panretinal laser photocoagulation; IVK intravitreal kenalog; VMT vitreomacular traction; MH Macular hole;  NVD neovascularization of the disc; NVE neovascularization elsewhere; AREDS age related eye disease study; ARMD age related macular degeneration; POAG primary open angle glaucoma; EBMD epithelial/anterior basement membrane dystrophy; ACIOL anterior chamber intraocular lens; IOL intraocular lens; PCIOL posterior chamber intraocular lens; Phaco/IOL phacoemulsification with intraocular lens placement; [place] photorefractive keratectomy; LASIK laser assisted in situ keratomileusis; HTN hypertension; DM diabetes mellitus; COPD chronic obstructive pulmonary disease

## 2020-04-29 ENCOUNTER — Encounter (HOSPITAL_COMMUNITY): Payer: Self-pay | Admitting: *Deleted

## 2020-04-29 NOTE — Progress Notes (Signed)
I emailed pathology and ordered foundation one, BRAF, MLH1 hypermethylation testing on accession # [id number]

## 2020-04-30 ENCOUNTER — Encounter (INDEPENDENT_AMBULATORY_CARE_PROVIDER_SITE_OTHER): Payer: Self-pay | Admitting: Ophthalmology

## 2020-04-30 ENCOUNTER — Ambulatory Visit (INDEPENDENT_AMBULATORY_CARE_PROVIDER_SITE_OTHER): Payer: Medicare HMO | Admitting: Ophthalmology

## 2020-04-30 ENCOUNTER — Other Ambulatory Visit: Payer: Self-pay

## 2020-04-30 DIAGNOSIS — Z961 Presence of intraocular lens: Secondary | ICD-10-CM | POA: Diagnosis not present

## 2020-04-30 DIAGNOSIS — H353231 Exudative age-related macular degeneration, bilateral, with active choroidal neovascularization: Secondary | ICD-10-CM | POA: Diagnosis not present

## 2020-04-30 DIAGNOSIS — H3581 Retinal edema: Secondary | ICD-10-CM

## 2020-04-30 DIAGNOSIS — H40053 Ocular hypertension, bilateral: Secondary | ICD-10-CM | POA: Diagnosis not present

## 2020-04-30 DIAGNOSIS — H43813 Vitreous degeneration, bilateral: Secondary | ICD-10-CM | POA: Diagnosis not present

## 2020-04-30 MED ORDER — AFLIBERCEPT 2MG/0.05ML IZ SOLN FOR KALEIDOSCOPE
2.0000 mg | INTRAVITREAL | Status: AC | PRN
  Administered 2020-04-30: 2 mg via INTRAVITREAL

## 2020-04-30 NOTE — H&P (Signed)
[facility] History and Physical     Chief Complaint     Routine Post Op      Gabrielle [name] is a 74 y.o. female.  HPI: Gabrielle [name] is 74 yo who is known to me and underwent a right hemicolectomy for cancer. Gabrielle [name] is T4a,N1a and says Gabrielle [name] is doing well. Gabrielle [name] also had bladder biopsies due to a thickened bladder and this came back inflammatory.  Gabrielle [name] is doing well post operatively and is tolerating a diet. Gabrielle [name] is having good pain control and is ambulating. Gabrielle [name] is ready to start eating more variety of foods. Gabrielle [name] has been having regular BMs. Her foley is out from her biopsy.  Pathology:  FINAL MICROSCOPIC DIAGNOSIS:   A. BLADDER, BIOPSY:  Benign urothelium and submucosa  with prominent inflammation, vascular ectasia and reactive changes   B. COLON, RIGHT, RESECTION:  Poorly differentiated adenocarcinoma of the ascending colon (Grade 3,  size 8.0 cm)  Tumor invades through the visceral peritoneum (pT4a)  Lymphovascular invasion is identified  Radial margin of resection is positive for tumor   One tumor deposit identified  Metastatic adenocarcinoma in one of fifteen nodes (1/15, 0.7 cm)   COLON AND RECTUM:  Procedure: Right hemicolectomy  Tumor Site: Ascending colon  Tumor Size: 8.0 cm  Macroscopic Tumor Perforation: Tumor invades through the entire  thickness of the wall, into visceral serosa. There is also tumor  undermining mucosa and involving ileocecal valve.  Histologic Type: Adenocarcinoma  Histologic Grade: G3  Tumor Extension: through bowel wall into visceral peritoneum  Margins: radial margin is positive  Treatment Effect: Negative  Lymphovascular Invasion: identified  Perineural Invasion: Identified  Tumor Deposits: identified  Regional Lymph Nodes:  No lymph nodes submitted or found: NA  Number of Lymph Nodes Involved: 1  Number of Lymph Nodes Examined: 15  Pathologic Stage Classification (pTNM, AJCC 8th Edition): pT4a, pN1a  Ancillary Studies: Loss  of the major and minor MMR proteins MLH1 and  PMS2 (performed on biopsy).  (MMR / MSI testing: )  Representative tumor block: B7  Comments:  Part 1: Immunostain has been perform on bladder biopsy specimen, the  urothelial cells are positive for ck8/18, p63, high molecular  cytokeratin, negative for p53, ck20. The results support the above  diagnosis.      Past Medical History:  Diagnosis Date  . Cataract    OU  . H/O cesarean section   . Hx of tonsillectomy   . Hyperlipidemia   . Hypertension   . Macular degeneration    Exu ARMD OU        Past Surgical History:  Procedure Laterality Date  . BIOPSY  04/06/2020   Procedure: BIOPSY; Surgeon: Rogene Houston, MD; Location: [facility]; Service: Endoscopy;;  . CARPAL TUNNEL RELEASE Right   . CATARACT EXTRACTION W/PHACO Left 10/11/2019   Procedure: CATARACT EXTRACTION PHACO AND INTRAOCULAR LENS PLACEMENT (IOC); Surgeon: Baruch Goldmann, MD; Location: [facility]; Service: Ophthalmology; Laterality: Left; CDE: 8.56  . CATARACT EXTRACTION W/PHACO Right 10/25/2019   Procedure: CATARACT EXTRACTION PHACO AND INTRAOCULAR LENS PLACEMENT (IOC); Surgeon: Baruch Goldmann, MD; Location: [facility]; Service: Ophthalmology; Laterality: Right; CDE: 5.67  . CESAREAN SECTION    . COLONOSCOPY N/A 04/06/2020   Procedure: COLONOSCOPY; Surgeon: Rogene Houston, MD; Location: [facility]; Service: Endoscopy; Laterality: N/A;  . CYSTOSCOPY WITH BIOPSY N/A 04/08/2020   Procedure: CYSTOSCOPY WITH BIOPSY; Surgeon: Cleon Gustin, MD; Location: [facility]; Service: Urology; Laterality: N/A;  . ESOPHAGOGASTRODUODENOSCOPY N/A  04/05/2020   Procedure: ESOPHAGOGASTRODUODENOSCOPY (EGD); Surgeon: Rogene Houston, MD; Location: [facility]; Service: Endoscopy; Laterality: N/A;  . PARTIAL COLECTOMY N/A 04/08/2020   Procedure: PARTIAL COLECTOMY; Surgeon: Virl Cagey, MD; Location: [facility]; Service: General; Laterality: N/A;  . TONSILLECTOMY          Family History    Problem Relation Age of Onset  . Macular degeneration Mother   . Stroke Mother   . Hypertension Mother   . Dementia Mother   . Cancer Father    lung  . Diabetes Sister   . Hypertension Sister    Social History       Tobacco Use  . Smoking status: Never Smoker  . Smokeless tobacco: Never Used  Substance Use Topics  . Alcohol use: No  . Drug use: No   Medications: I have reviewed the patient's current medications.       Allergies as of 04/21/2020      Reactions   Lisinopril Cough   Pt states Gabrielle [name] is not allergic to lisinopril           Medication List       Accurate as of Apr 21, 2020 11:59 PM. If you have any questions, ask your nurse or doctor.        acetaminophen 500 MG tablet  Commonly known as: TYLENOL  Take 1,000 mg by mouth every 6 (six) hours as needed.   alendronate 70 MG tablet  Commonly known as: FOSAMAX  Take 1 tablet (70 mg total) by mouth every 7 (seven) days. Take with a full glass of water on an empty stomach.   atorvastatin 20 MG tablet  Commonly known as: LIPITOR  Take 1 tablet (20 mg total) by mouth daily.   cholecalciferol 25 MCG (1000 UNIT) tablet  Commonly known as: VITAMIN D3  Take 1,000 Units by mouth daily.   diclofenac Sodium 1 % Gel  Commonly known as: VOLTAREN  Apply 4 g topically 4 (four) times daily.   dorzolamide-timolol 22.3-6.8 MG/ML ophthalmic solution  Commonly known as: COSOPT  Place 1 drop into both eyes daily.   ferrous sulfate 325 (65 FE) MG EC tablet  Take 1 tablet (325 mg total) by mouth 2 (two) times daily with a meal.   losartan-hydrochlorothiazide 100-25 MG tablet  Commonly known as: HYZAAR  TAKE 1 TABLET DAILY. Needs to be seen for further refills.   potassium chloride SA 20 MEQ tablet  Commonly known as: KLOR-CON  Take 1 tablet (20 mEq total) by mouth daily.  Started by: Evelina Dun, FNP       ROS:  A comprehensive review of systems was negative except for: Gastrointestinal: positive for mild  incisional pain  Blood pressure (!) 169/82, pulse 69, temperature 98 F (36.7 C), height _0  (1.575 m), weight 138 lb (62.6 kg), SpO2 95 %.  Physical Exam  Vitals reviewed.  Constitutional:  Appearance: Gabrielle [name] is normal weight.  HENT:  Head: Normocephalic.  Nose: Nose normal.  Mouth/Throat:  Mouth: Mucous membranes are moist.  Eyes:  Pupils: Pupils are equal, round, and reactive to light.  Cardiovascular:  Rate and Rhythm: Normal rate and regular rhythm.  Pulmonary:  Effort: Pulmonary effort is normal.  Breath sounds: Normal breath sounds.  Abdominal:  General: There is no distension.  Palpations: Abdomen is soft.  Tenderness: There is no abdominal tenderness.  Hernia: No hernia is present.  Comments: Staples removed, steri strips placed  Musculoskeletal:  General: No swelling. Normal range of  motion.  Cervical back: Normal range of motion. No rigidity.  Skin:  General: Skin is warm and dry.  Neurological:  General: No focal deficit present.  Mental Status: Gabrielle [name] is alert and oriented to person, place, and time.  Psychiatric:  Mood and Affect: Mood normal.  Behavior: Behavior normal.  Thought Content: Thought content normal.  Judgment: Judgment normal.   Results:  Results for Gabrielle [name], Gabrielle [name] (MRN [id number]) as of 04/30/2020 10:02  Ref. Range 04/20/2020 16:32  Sodium Latest Ref Range: 134 - 144 mmol/L 139  Potassium Latest Ref Range: 3.5 - 5.2 mmol/L 3.1 (L)  Chloride Latest Ref Range: 96 - 106 mmol/L 96  CO2 Latest Ref Range: 20 - 29 mmol/L 30 (H)  Glucose Latest Ref Range: 65 - 99 mg/dL 89  BUN Latest Ref Range: 8 - 27 mg/dL 15  Creatinine Latest Ref Range: 0.57 - 1.00 mg/dL 1.43 (H)  Calcium Latest Ref Range: 8.7 - 10.3 mg/dL 8.7  BUN/Creatinine Ratio Latest Ref Range: 12 - 28  10 (L)  Alkaline Phosphatase Latest Ref Range: 48 - 121 IU/L 86  Albumin Latest Ref Range: 3.7 - 4.7 g/dL 3.3 (L)  Albumin/Globulin Ratio Latest Ref Range: 1.2 - 2.2  1.2  AST Latest Ref  Range: 0 - 40 IU/L 10  ALT Latest Ref Range: 0 - 32 IU/L 5  Total Protein Latest Ref Range: 6.0 - 8.5 g/dL 6.0  Total Bilirubin Latest Ref Range: 0.0 - 1.2 mg/dL <0.2  GFR, Est Non African American Latest Ref Range: >59 mL/min/1.73 36 (L)  GFR, Est African American Latest Ref Range: >59 mL/min/1.73 42 (L)  Globulin, Total Latest Ref Range: 1.5 - 4.5 g/dL 2.7  WBC Latest Ref Range: 3.4 - 10.8 x10E3/uL 6.1  RBC Latest Ref Range: 3.77 - 5.28 x10E6/uL 2.96 (L)  Hemoglobin Latest Ref Range: 11.1 - 15.9 g/dL 7.9 (LL)  HCT Latest Ref Range: 34.0 - 46.6 % 25.4 (L)  MCV Latest Ref Range: 79 - 97 fL 86  MCH Latest Ref Range: 26.6 - 33.0 pg 26.7  MCHC Latest Ref Range: 31.5 - 35.7 g/dL 31.1 (L)  RDW Latest Ref Range: 11.7 - 15.4 % 18.5 (H)  Platelets Latest Ref Range: 150 - 450 x10E3/uL 247  Neutrophils Latest Ref Range: Not Estab. % 61   Assessment & Plan:  Gabrielle [name] is a 74 y.o. female with Stage III colon cancer. Doing well overall. Gabrielle [name] is feeling well and eating and having BMs. Gabrielle [name] sees Dr. Delton Coombes next week.  -Discussed port placement and risk of bleeding, infection, malfunction, pneumothorax. Gabrielle [name] plans to proceed with treatment and we can get a port in place as soon as needed.  Strips will fall off in the next 5-7 days. Leave them on until then or until they are peeling.  No heavy lifting > 10 lbs, excessive bending, pushing, pulling, squatting for 6- 8 weeks after surgery.  All questions were answered to the satisfaction of the patient and family.  Virl Cagey  04/22/2020, 7:33 AM

## 2020-05-01 DIAGNOSIS — I1310 Hypertensive heart and chronic kidney disease without heart failure, with stage 1 through stage 4 chronic kidney disease, or unspecified chronic kidney disease: Secondary | ICD-10-CM | POA: Diagnosis not present

## 2020-05-01 DIAGNOSIS — D631 Anemia in chronic kidney disease: Secondary | ICD-10-CM | POA: Diagnosis not present

## 2020-05-01 DIAGNOSIS — D630 Anemia in neoplastic disease: Secondary | ICD-10-CM | POA: Diagnosis not present

## 2020-05-01 DIAGNOSIS — I708 Atherosclerosis of other arteries: Secondary | ICD-10-CM | POA: Diagnosis not present

## 2020-05-01 DIAGNOSIS — I088 Other rheumatic multiple valve diseases: Secondary | ICD-10-CM | POA: Diagnosis not present

## 2020-05-01 DIAGNOSIS — C182 Malignant neoplasm of ascending colon: Secondary | ICD-10-CM | POA: Diagnosis not present

## 2020-05-01 DIAGNOSIS — N1832 Chronic kidney disease, stage 3b: Secondary | ICD-10-CM | POA: Diagnosis not present

## 2020-05-01 DIAGNOSIS — I700 Atherosclerosis of aorta: Secondary | ICD-10-CM | POA: Diagnosis not present

## 2020-05-01 DIAGNOSIS — Z483 Aftercare following surgery for neoplasm: Secondary | ICD-10-CM | POA: Diagnosis not present

## 2020-05-05 DIAGNOSIS — I1310 Hypertensive heart and chronic kidney disease without heart failure, with stage 1 through stage 4 chronic kidney disease, or unspecified chronic kidney disease: Secondary | ICD-10-CM | POA: Diagnosis not present

## 2020-05-05 DIAGNOSIS — C182 Malignant neoplasm of ascending colon: Secondary | ICD-10-CM | POA: Diagnosis not present

## 2020-05-05 DIAGNOSIS — D631 Anemia in chronic kidney disease: Secondary | ICD-10-CM | POA: Diagnosis not present

## 2020-05-05 DIAGNOSIS — N1832 Chronic kidney disease, stage 3b: Secondary | ICD-10-CM | POA: Diagnosis not present

## 2020-05-05 DIAGNOSIS — I700 Atherosclerosis of aorta: Secondary | ICD-10-CM | POA: Diagnosis not present

## 2020-05-05 DIAGNOSIS — I708 Atherosclerosis of other arteries: Secondary | ICD-10-CM | POA: Diagnosis not present

## 2020-05-05 DIAGNOSIS — I088 Other rheumatic multiple valve diseases: Secondary | ICD-10-CM | POA: Diagnosis not present

## 2020-05-05 DIAGNOSIS — D630 Anemia in neoplastic disease: Secondary | ICD-10-CM | POA: Diagnosis not present

## 2020-05-05 DIAGNOSIS — Z483 Aftercare following surgery for neoplasm: Secondary | ICD-10-CM | POA: Diagnosis not present

## 2020-05-07 ENCOUNTER — Encounter (HOSPITAL_COMMUNITY): Payer: Self-pay | Admitting: Hematology

## 2020-05-07 DIAGNOSIS — N1832 Chronic kidney disease, stage 3b: Secondary | ICD-10-CM | POA: Diagnosis not present

## 2020-05-07 DIAGNOSIS — I708 Atherosclerosis of other arteries: Secondary | ICD-10-CM | POA: Diagnosis not present

## 2020-05-07 DIAGNOSIS — I1310 Hypertensive heart and chronic kidney disease without heart failure, with stage 1 through stage 4 chronic kidney disease, or unspecified chronic kidney disease: Secondary | ICD-10-CM | POA: Diagnosis not present

## 2020-05-07 DIAGNOSIS — Z483 Aftercare following surgery for neoplasm: Secondary | ICD-10-CM | POA: Diagnosis not present

## 2020-05-07 DIAGNOSIS — I700 Atherosclerosis of aorta: Secondary | ICD-10-CM | POA: Diagnosis not present

## 2020-05-07 DIAGNOSIS — D630 Anemia in neoplastic disease: Secondary | ICD-10-CM | POA: Diagnosis not present

## 2020-05-07 DIAGNOSIS — C182 Malignant neoplasm of ascending colon: Secondary | ICD-10-CM | POA: Diagnosis not present

## 2020-05-07 DIAGNOSIS — I088 Other rheumatic multiple valve diseases: Secondary | ICD-10-CM | POA: Diagnosis not present

## 2020-05-07 DIAGNOSIS — D631 Anemia in chronic kidney disease: Secondary | ICD-10-CM | POA: Diagnosis not present

## 2020-05-08 ENCOUNTER — Other Ambulatory Visit (HOSPITAL_COMMUNITY): Payer: Medicare HMO

## 2020-05-11 ENCOUNTER — Ambulatory Visit (HOSPITAL_COMMUNITY)
Admission: RE | Admit: 2020-05-11 | Discharge: 2020-05-11 | Disposition: A | Discharge status: Home / Self Care | Payer: Medicare HMO | Source: Ambulatory Visit | Attending: Hematology | Admitting: Hematology

## 2020-05-11 ENCOUNTER — Other Ambulatory Visit: Payer: Self-pay

## 2020-05-11 DIAGNOSIS — C182 Malignant neoplasm of ascending colon: Secondary | ICD-10-CM | POA: Insufficient documentation

## 2020-05-11 DIAGNOSIS — C189 Malignant neoplasm of colon, unspecified: Secondary | ICD-10-CM | POA: Diagnosis not present

## 2020-05-11 MED ORDER — FLUDEOXYGLUCOSE F - 18 (FDG) INJECTION
7.2700 | Freq: Once | INTRAVENOUS | Status: AC | PRN
  Administered 2020-05-11: 7.27 via INTRAVENOUS

## 2020-05-12 NOTE — Patient Instructions (Signed)
GHINA BITTINGER  05/12/2020     @PREFPERIOPPHARMACY @   Your procedure is scheduled on  05/18/2020   Report to [facility] at  [place].  Call this number if you have problems the morning of surgery:  [phone]   Remember:  Do not eat or drink after midnight.                        Take these medicines the morning of surgery with A SIP OF WATER  None    Do not wear jewelry, make-up or nail polish.  Do not wear lotions, powders, or perfumes, or deodorant.  Do not shave 48 hours prior to surgery.  Men may shave face and neck.  Do not bring valuables to the hospital.  [facility] is not responsible for any belongings or valuables.  Contacts, dentures or bridgework may not be worn into surgery.  Leave your suitcase in the car.  After surgery it may be brought to your room.  For patients admitted to the hospital, discharge time will be determined by your treatment team.  Patients discharged the day of surgery will not be allowed to drive home.   Name and phone number of your driver:   family Special instructions:  DO NOT smoke the morning of your procedure.  Please read over the following fact sheets that you were given. Anesthesia Post-op Instructions and Care and Recovery After Surgery       Implanted Port Insertion, Care After This sheet gives you information about how to care for yourself after your procedure. Your health care provider may also give you more specific instructions. If you have problems or questions, contact your health care provider. What can I expect after the procedure? After the procedure, it is common to have:  Discomfort at the port insertion site.  Bruising on the skin over the port. This should improve over 3-4 days. Follow these instructions at home: [facility] care  After your port is placed, you will get a manufacturer's information card. The card has information about your port. Keep this card with you at all times.  Take care  of the port as told by your health care provider. Ask your health care provider if you or a family member can get training for taking care of the port at home. A home health care nurse may also take care of the port.  Make sure to remember what type of port you have. Incision care      Follow instructions from your health care provider about how to take care of your port insertion site. Make sure you: ? Wash your hands with soap and water before and after you change your bandage (dressing). If soap and water are not available, use hand sanitizer. ? Change your dressing as told by your health care provider. ? Leave stitches (sutures), skin glue, or adhesive strips in place. These skin closures may need to stay in place for 2 weeks or longer. If adhesive strip edges start to loosen and curl up, you may trim the loose edges. Do not remove adhesive strips completely unless your health care provider tells you to do that.  Check your port insertion site every day for signs of infection. Check for: ? Redness, swelling, or pain. ? Fluid or blood. ? Warmth. ? Pus or a bad smell. Activity  Return to your normal activities as told by your  health care provider. Ask your health care provider what activities are safe for you.  Do not lift anything that is heavier than 10 lb (4.5 kg), or the limit that you are told, until your health care provider says that it is safe. General instructions  Take over-the-counter and prescription medicines only as told by your health care provider.  Do not take baths, swim, or use a hot tub until your health care provider approves. Ask your health care provider if you may take showers. You may only be allowed to take sponge baths.  Do not drive for 24 hours if you were given a sedative during your procedure.  Wear a medical alert bracelet in case of an emergency. This will tell any health care providers that you have a port.  Keep all follow-up visits as told by  your health care provider. This is important. Contact a health care provider if:  You cannot flush your port with saline as directed, or you cannot draw blood from the port.  You have a fever or chills.  You have redness, swelling, or pain around your port insertion site.  You have fluid or blood coming from your port insertion site.  Your port insertion site feels warm to the touch.  You have pus or a bad smell coming from the port insertion site. Get help right away if:  You have chest pain or shortness of breath.  You have bleeding from your port that you cannot control. Summary  Take care of the port as told by your health care provider. Keep the manufacturer's information card with you at all times.  Change your dressing as told by your health care provider.  Contact a health care provider if you have a fever or chills or if you have redness, swelling, or pain around your port insertion site.  Keep all follow-up visits as told by your health care provider. This information is not intended to replace advice given to you by your health care provider. Make sure you discuss any questions you have with your health care provider. Document Revised: 06/19/2018 Document Reviewed: 06/19/2018 Elsevier Patient Education  [place] An implanted port is a device that is placed under the skin. It is usually placed in the chest. The device can be used to give IV medicine, to take blood, or for dialysis. You may have an implanted port if:  You need IV medicine that would be irritating to the small veins in your hands or arms.  You need IV medicines, such as antibiotics, for a long period of time.  You need IV nutrition for a long period of time.  You need dialysis. Having a port means that your health care provider will not need to use the veins in your arms for these procedures. You may have fewer limitations when using a port than you would if you  used other types of long-term IVs, and you will likely be able to return to normal activities after your incision heals. An implanted port has two main parts:  Reservoir. The reservoir is the part where a needle is inserted to give medicines or draw blood. The reservoir is round. After it is placed, it appears as a small, raised area under your skin.  Catheter. The catheter is a thin, flexible tube that connects the reservoir to a vein. Medicine that is inserted into the reservoir goes into the catheter and then into the vein. How is my port  accessed? To access your port:  A numbing cream may be placed on the skin over the port site.  Your health care provider will put on a mask and sterile gloves.  The skin over your port will be cleaned carefully with a germ-killing soap and allowed to dry.  Your health care provider will gently pinch the port and insert a needle into it.  Your health care provider will check for a blood return to make sure the port is in the vein and is not clogged.  If your port needs to remain accessed to get medicine continuously (constant infusion), your health care provider will place a clear bandage (dressing) over the needle site. The dressing and needle will need to be changed every week, or as told by your health care provider. What is flushing? Flushing helps keep the port from getting clogged. Follow instructions from your health care provider about how and when to flush the port. Ports are usually flushed with saline solution or a medicine called heparin. The need for flushing will depend on how the port is used:  If the port is only used from time to time to give medicines or draw blood, the port may need to be flushed: ? Before and after medicines have been given. ? Before and after blood has been drawn. ? As part of routine maintenance. Flushing may be recommended every 4-6 weeks.  If a constant infusion is running, the port may not need to be  flushed.  Throw away any syringes in a disposal container that is meant for sharp items (sharps container). You can buy a sharps container from a pharmacy, or you can make one by using an empty hard plastic bottle with a cover. How long will my port stay implanted? The port can stay in for as long as your health care provider thinks it is needed. When it is time for the port to come out, a surgery will be done to remove it. The surgery will be similar to the procedure that was done to put the port in. Follow these instructions at home:   Flush your port as told by your health care provider.  If you need an infusion over several days, follow instructions from your health care provider about how to take care of your port site. Make sure you: ? Wash your hands with soap and water before you change your dressing. If soap and water are not available, use alcohol-based hand sanitizer. ? Change your dressing as told by your health care provider. ? Place any used dressings or infusion bags into a plastic bag. Throw that bag in the trash. ? Keep the dressing that covers the needle clean and dry. Do not get it wet. ? Do not use scissors or sharp objects near the tube. ? Keep the tube clamped, unless it is being used.  Check your port site every day for signs of infection. Check for: ? Redness, swelling, or pain. ? Fluid or blood. ? Pus or a bad smell.  Protect the skin around the port site. ? Avoid wearing bra straps that rub or irritate the site. ? Protect the skin around your port from seat belts. Place a soft pad over your chest if needed.  Bathe or shower as told by your health care provider. The site may get wet as long as you are not actively receiving an infusion.  Return to your normal activities as told by your health care provider. Ask your health care provider  what activities are safe for you.  Carry a medical alert card or wear a medical alert bracelet at all times. This will let  health care providers know that you have an implanted port in case of an emergency. Get help right away if:  You have redness, swelling, or pain at the port site.  You have fluid or blood coming from your port site.  You have pus or a bad smell coming from the port site.  You have a fever. Summary  Implanted ports are usually placed in the chest for long-term IV access.  Follow instructions from your health care provider about flushing the port and changing bandages (dressings).  Take care of the area around your port by avoiding clothing that puts pressure on the area, and by watching for signs of infection.  Protect the skin around your port from seat belts. Place a soft pad over your chest if needed.  Get help right away if you have a fever or you have redness, swelling, pain, drainage, or a bad smell at the port site. This information is not intended to replace advice given to you by your health care provider. Make sure you discuss any questions you have with your health care provider. Document Revised: 03/15/2019 Document Reviewed: 12/24/2016 Elsevier Patient Education  2020 [place] After These instructions provide you with information about caring for yourself after your procedure. Your health care provider may also give you more specific instructions. Your treatment has been planned according to current medical practices, but problems sometimes occur. Call your health care provider if you have any problems or questions after your procedure. What can I expect after the procedure? After your procedure, you may:  Feel sleepy for several hours.  Feel clumsy and have poor balance for several hours.  Feel forgetful about what happened after the procedure.  Have poor judgment for several hours.  Feel nauseous or vomit.  Have a sore throat if you had a breathing tube during the procedure. Follow these instructions at home: For at least  24 hours after the procedure:      Have a responsible adult stay with you. It is important to have someone help care for you until you are awake and alert.  Rest as needed.  Do not: ? Participate in activities in which you could fall or become injured. ? Drive. ? Use heavy machinery. ? Drink alcohol. ? Take sleeping pills or medicines that cause drowsiness. ? Make important decisions or sign legal documents. ? Take care of children on your own. Eating and drinking  Follow the diet that is recommended by your health care provider.  If you vomit, drink water, juice, or soup when you can drink without vomiting.  Make sure you have little or no nausea before eating solid foods. General instructions  Take over-the-counter and prescription medicines only as told by your health care provider.  If you have sleep apnea, surgery and certain medicines can increase your risk for breathing problems. Follow instructions from your health care provider about wearing your sleep device: ? Anytime you are sleeping, including during daytime naps. ? While taking prescription pain medicines, sleeping medicines, or medicines that make you drowsy.  If you smoke, do not smoke without supervision.  Keep all follow-up visits as told by your health care provider. This is important. Contact a health care provider if:  You keep feeling nauseous or you keep vomiting.  You feel light-headed.  You develop  a rash.  You have a fever. Get help right away if:  You have trouble breathing. Summary  For several hours after your procedure, you may feel sleepy and have poor judgment.  Have a responsible adult stay with you for at least 24 hours or until you are awake and alert. This information is not intended to replace advice given to you by your health care provider. Make sure you discuss any questions you have with your health care provider. Document Revised: 02/19/2018 Document Reviewed:  03/13/2016 Elsevier Patient Education  [place]. How to Use Chlorhexidine for Bathing Chlorhexidine gluconate (CHG) is a germ-killing (antiseptic) solution that is used to clean the skin. It can get rid of the bacteria that normally live on the skin and can keep them away for about 24 hours. To clean your skin with CHG, you may be given:  A CHG solution to use in the shower or as part of a sponge bath.  A prepackaged cloth that contains CHG. Cleaning your skin with CHG may help lower the risk for infection:  While you are staying in the intensive care unit of the hospital.  If you have a vascular access, such as a central line, to provide short-term or long-term access to your veins.  If you have a catheter to drain urine from your bladder.  If you are on a ventilator. A ventilator is a machine that helps you breathe by moving air in and out of your lungs.  After surgery. What are the risks? Risks of using CHG include:  A skin reaction.  Hearing loss, if CHG gets in your ears.  Eye injury, if CHG gets in your eyes and is not rinsed out.  The CHG product catching fire. Make sure that you avoid smoking and flames after applying CHG to your skin. Do not use CHG:  If you have a chlorhexidine allergy or have previously reacted to chlorhexidine.  On babies younger than 9 months of age. How to use CHG solution  Use CHG only as told by your health care provider, and follow the instructions on the label.  Use the full amount of CHG as directed. Usually, this is one bottle. During a shower Follow these steps when using CHG solution during a shower (unless your health care provider gives you different instructions): 1. Start the shower. 2. Use your normal soap and shampoo to wash your face and hair. 3. Turn off the shower or move out of the shower stream. 4. Pour the CHG onto a clean washcloth. Do not use any type of brush or rough-edged sponge. 5. Starting at your neck,  lather your body down to your toes. Make sure you follow these instructions: ? If you will be having surgery, pay special attention to the part of your body where you will be having surgery. Scrub this area for at least 1 minute. ? Do not use CHG on your head or face. If the solution gets into your ears or eyes, rinse them well with water. ? Avoid your genital area. ? Avoid any areas of skin that have broken skin, cuts, or scrapes. ? Scrub your back and under your arms. Make sure to wash skin folds. 6. Let the lather sit on your skin for 1-2 minutes or as long as told by your health care provider. 7. Thoroughly rinse your entire body in the shower. Make sure that all body creases and crevices are rinsed well. 8. Dry off with a clean towel. Do  not put any substances on your body afterward--such as powder, lotion, or perfume--unless you are told to do so by your health care provider. Only use lotions that are recommended by the manufacturer. 9. Put on clean clothes or pajamas. 10. If it is the night before your surgery, sleep in clean sheets.  During a sponge bath Follow these steps when using CHG solution during a sponge bath (unless your health care provider gives you different instructions): 1. Use your normal soap and shampoo to wash your face and hair. 2. Pour the CHG onto a clean washcloth. 3. Starting at your neck, lather your body down to your toes. Make sure you follow these instructions: ? If you will be having surgery, pay special attention to the part of your body where you will be having surgery. Scrub this area for at least 1 minute. ? Do not use CHG on your head or face. If the solution gets into your ears or eyes, rinse them well with water. ? Avoid your genital area. ? Avoid any areas of skin that have broken skin, cuts, or scrapes. ? Scrub your back and under your arms. Make sure to wash skin folds. 4. Let the lather sit on your skin for 1-2 minutes or as long as told by your  health care provider. 5. Using a different clean, wet washcloth, thoroughly rinse your entire body. Make sure that all body creases and crevices are rinsed well. 6. Dry off with a clean towel. Do not put any substances on your body afterward--such as powder, lotion, or perfume--unless you are told to do so by your health care provider. Only use lotions that are recommended by the manufacturer. 7. Put on clean clothes or pajamas. 8. If it is the night before your surgery, sleep in clean sheets. How to use CHG prepackaged cloths  Only use CHG cloths as told by your health care provider, and follow the instructions on the label.  Use the CHG cloth on clean, dry skin.  Do not use the CHG cloth on your head or face unless your health care provider tells you to.  When washing with the CHG cloth: ? Avoid your genital area. ? Avoid any areas of skin that have broken skin, cuts, or scrapes. Before surgery Follow these steps when using a CHG cloth to clean before surgery (unless your health care provider gives you different instructions): 1. Using the CHG cloth, vigorously scrub the part of your body where you will be having surgery. Scrub using a back-and-forth motion for 3 minutes. The area on your body should be completely wet with CHG when you are done scrubbing. 2. Do not rinse. Discard the cloth and let the area air-dry. Do not put any substances on the area afterward, such as powder, lotion, or perfume. 3. Put on clean clothes or pajamas. 4. If it is the night before your surgery, sleep in clean sheets.  For general bathing Follow these steps when using CHG cloths for general bathing (unless your health care provider gives you different instructions). 1. Use a separate CHG cloth for each area of your body. Make sure you wash between any folds of skin and between your fingers and toes. Wash your body in the following order, switching to a new cloth after each step: ? The front of your neck,  shoulders, and chest. ? Both of your arms, under your arms, and your hands. ? Your stomach and groin area, avoiding the genitals. ? Your right leg and  foot. ? Your left leg and foot. ? The back of your neck, your back, and your buttocks. 2. Do not rinse. Discard the cloth and let the area air-dry. Do not put any substances on your body afterward--such as powder, lotion, or perfume--unless you are told to do so by your health care provider. Only use lotions that are recommended by the manufacturer. 3. Put on clean clothes or pajamas. Contact a health care provider if:  Your skin gets irritated after scrubbing.  You have questions about using your solution or cloth. Get help right away if:  Your eyes become very red or swollen.  Your eyes itch badly.  Your skin itches badly and is red or swollen.  Your hearing changes.  You have trouble seeing.  You have swelling or tingling in your mouth or throat.  You have trouble breathing.  You swallow any chlorhexidine. Summary  Chlorhexidine gluconate (CHG) is a germ-killing (antiseptic) solution that is used to clean the skin. Cleaning your skin with CHG may help to lower your risk for infection.  You may be given CHG to use for bathing. It may be in a bottle or in a prepackaged cloth to use on your skin. Carefully follow your health care provider's instructions and the instructions on the product label.  Do not use CHG if you have a chlorhexidine allergy.  Contact your health care provider if your skin gets irritated after scrubbing. This information is not intended to replace advice given to you by your health care provider. Make sure you discuss any questions you have with your health care provider. Document Revised: 02/07/2019 Document Reviewed: 10/19/2017 Elsevier Patient Education  [place].

## 2020-05-13 ENCOUNTER — Inpatient Hospital Stay (HOSPITAL_COMMUNITY): Payer: Medicare HMO

## 2020-05-13 ENCOUNTER — Inpatient Hospital Stay (HOSPITAL_COMMUNITY): Payer: Medicare HMO | Attending: Hematology | Admitting: Hematology

## 2020-05-13 ENCOUNTER — Other Ambulatory Visit: Payer: Self-pay

## 2020-05-13 VITALS — BP 153/54 | HR 72 | Temp 97.3°F | Resp 18 | Wt 134.7 lb

## 2020-05-13 DIAGNOSIS — D630 Anemia in neoplastic disease: Secondary | ICD-10-CM | POA: Diagnosis not present

## 2020-05-13 DIAGNOSIS — Z483 Aftercare following surgery for neoplasm: Secondary | ICD-10-CM | POA: Diagnosis not present

## 2020-05-13 DIAGNOSIS — D509 Iron deficiency anemia, unspecified: Secondary | ICD-10-CM | POA: Diagnosis not present

## 2020-05-13 DIAGNOSIS — C182 Malignant neoplasm of ascending colon: Secondary | ICD-10-CM | POA: Diagnosis not present

## 2020-05-13 DIAGNOSIS — D631 Anemia in chronic kidney disease: Secondary | ICD-10-CM | POA: Diagnosis not present

## 2020-05-13 DIAGNOSIS — Z5111 Encounter for antineoplastic chemotherapy: Secondary | ICD-10-CM | POA: Insufficient documentation

## 2020-05-13 DIAGNOSIS — Z452 Encounter for adjustment and management of vascular access device: Secondary | ICD-10-CM | POA: Diagnosis not present

## 2020-05-13 DIAGNOSIS — I088 Other rheumatic multiple valve diseases: Secondary | ICD-10-CM | POA: Diagnosis not present

## 2020-05-13 DIAGNOSIS — I700 Atherosclerosis of aorta: Secondary | ICD-10-CM | POA: Diagnosis not present

## 2020-05-13 DIAGNOSIS — N1832 Chronic kidney disease, stage 3b: Secondary | ICD-10-CM | POA: Diagnosis not present

## 2020-05-13 DIAGNOSIS — N189 Chronic kidney disease, unspecified: Secondary | ICD-10-CM | POA: Diagnosis not present

## 2020-05-13 DIAGNOSIS — I708 Atherosclerosis of other arteries: Secondary | ICD-10-CM | POA: Diagnosis not present

## 2020-05-13 DIAGNOSIS — I1310 Hypertensive heart and chronic kidney disease without heart failure, with stage 1 through stage 4 chronic kidney disease, or unspecified chronic kidney disease: Secondary | ICD-10-CM | POA: Diagnosis not present

## 2020-05-13 LAB — CBC
HCT: 32 % — ABNORMAL LOW (ref 36.0–46.0)
Hemoglobin: 9.8 g/dL — ABNORMAL LOW (ref 12.0–15.0)
MCH: 28.5 pg (ref 26.0–34.0)
MCHC: 30.6 g/dL (ref 30.0–36.0)
MCV: 93 fL (ref 80.0–100.0)
Platelets: 186 10*3/uL (ref 150–400)
RBC: 3.44 MIL/uL — ABNORMAL LOW (ref 3.87–5.11)
RDW: 21.3 % — ABNORMAL HIGH (ref 11.5–15.5)
WBC: 8.3 10*3/uL (ref 4.0–10.5)
nRBC: 0 % (ref 0.0–0.2)

## 2020-05-13 LAB — VITAMIN B12: Vitamin B-12: 408 pg/mL (ref 180–914)

## 2020-05-13 LAB — FOLATE: Folate: 7.9 ng/mL (ref >5.9)

## 2020-05-13 LAB — FERRITIN
Ferritin: 64 ng/mL (ref 11–307)
Ferritin: 65 ng/mL (ref 11–307)

## 2020-05-13 LAB — IRON AND TIBC
Iron: 92 ug/dL (ref 28–170)
Iron: 91 ug/dL (ref 28–170)
Saturation Ratios: 23 % (ref 10.4–31.8)
Saturation Ratios: 23 % (ref 10.4–31.8)
TIBC: 395 ug/dL (ref 250–450)
TIBC: 396 ug/dL (ref 250–450)
UIBC: 303 ug/dL
UIBC: 305 ug/dL

## 2020-05-13 NOTE — Patient Instructions (Signed)
[place] at [facility] Discharge Instructions  You were seen today by Dr. Delton Coombes. He went over your recent results. You will be trained how to take care of your port and begin treatment during your next visit. The side effects of the chemotherapy were discussed with you today. Dr. Delton Coombes will see you during your first treatment for labs and follow up.   Thank you for choosing [place] at [facility] to provide your oncology and hematology care.  To afford each patient quality time with our provider, please arrive at least 15 minutes before your scheduled appointment time.   If you have a lab appointment with the [facility] please come in thru the Main Entrance and check in at the main information desk  You need to re-schedule your appointment should you arrive 10 or more minutes late.  We strive to give you quality time with our providers, and arriving late affects you and other patients whose appointments are after yours.  Also, if you no show three or more times for appointments you may be dismissed from the clinic at the providers discretion.     Again, thank you for choosing [facility]  Our hope is that these requests will decrease the amount of time that you wait before being seen by our physicians.       _____________________________________________________________  Should you have questions after your visit to [facility], please contact our office at [phone] between the hours of 8:00 a.m. and 4:30 p.m.  Voicemails left after 4:00 p.m. will not be returned until the following business day.  For prescription refill requests, have your pharmacy contact our office and allow 72 hours.    Cancer Center Support Programs:   > Cancer Support Group  2nd Tuesday of the month 1pm-2pm, Journey Room

## 2020-05-13 NOTE — Progress Notes (Signed)
[place], [place] [address]   CLINIC:  Medical Oncology/Hematology  PCP:  Janora Norlander, DO [address] [place] [address] [phone]   REASON FOR VISIT:  Follow-up for R colon cancer  PRIOR THERAPY: Right hemicolectomy on 04/08/2020  NGS Results: Not done.  CURRENT THERAPY: FOLFOX.  BRIEF ONCOLOGIC HISTORY:  Oncology History   No history exists.    CANCER STAGING: Cancer Staging Malignant neoplasm of ascending colon [facility]) Staging form: Colon and Rectum, AJCC 8th Edition - Clinical stage from 04/27/2020: Stage IIIB (cT4a, cN1a, cM0) - Unsigned   INTERVAL HISTORY:  Gabrielle [name], a 73 y.o. female, returns for routine follow-up of her R-sided colon cancer. Gabrielle [name] was last seen on 04/27/2020.  Today she reports that she has been having a dry cough for years. Her nose has been runny for a while now, which might be the cause of the cough. She is able to ambulate without issues. She denies having any numbness/tingling or any history of neuropathy. She denies having hematuria or any issues with urinating. Her only allergy symptoms are watery eyes.   REVIEW OF SYSTEMS:  Review of Systems  Constitutional: Positive for appetite change (mildly decreased) and fatigue (mild).  Respiratory: Positive for cough.   Gastrointestinal: Negative for abdominal pain.  Genitourinary: Negative for difficulty urinating and hematuria.   Neurological: Negative for numbness.  All other systems reviewed and are negative.   PAST MEDICAL/SURGICAL HISTORY:  Past Medical History:  Diagnosis Date  . Cataract    OU  . H/O cesarean section   . Hx of tonsillectomy   . Hyperlipidemia   . Hypertension   . Macular degeneration    Exu ARMD OU   Past Surgical History:  Procedure Laterality Date  . BIOPSY  04/06/2020   Procedure: BIOPSY;  Surgeon: Rogene Houston, MD;  [facility];  Service: Endoscopy;;  . CARPAL TUNNEL RELEASE  Right   . CATARACT EXTRACTION W/PHACO Left 10/11/2019   Procedure: CATARACT EXTRACTION PHACO AND INTRAOCULAR LENS PLACEMENT (IOC);  Surgeon: Baruch Goldmann, MD;  Location: [facility];  Service: Ophthalmology;  Laterality: Left;  CDE: 8.56  . CATARACT EXTRACTION W/PHACO Right 10/25/2019   Procedure: CATARACT EXTRACTION PHACO AND INTRAOCULAR LENS PLACEMENT (IOC);  Surgeon: Baruch Goldmann, MD;  Location: [facility];  Service: Ophthalmology;  Laterality: Right;  CDE: 5.67  . CESAREAN SECTION    . COLONOSCOPY N/A 04/06/2020   Procedure: COLONOSCOPY;  Surgeon: Rogene Houston, MD;  Location[facility];  Service: Endoscopy;  Laterality: N/A;  . CYSTOSCOPY WITH BIOPSY N/A 04/08/2020   Procedure: CYSTOSCOPY WITH BIOPSY;  Surgeon: Cleon Gustin, MD;  Location: [facility];  Service: Urology;  Laterality: N/A;  . ESOPHAGOGASTRODUODENOSCOPY N/A 04/05/2020   Procedure: ESOPHAGOGASTRODUODENOSCOPY (EGD);  Surgeon: Rogene Houston, MD;  [facility];  Service: Endoscopy;  Laterality: N/A;  . PARTIAL COLECTOMY N/A 04/08/2020   Procedure: PARTIAL COLECTOMY;  Surgeon: Virl Cagey, MD;  Location: [facility];  Service: General;  Laterality: N/A;  . TONSILLECTOMY      SOCIAL HISTORY:  Social History   Socioeconomic History  . Marital status: Divorced    Spouse name: Not on file  . Number of children: Not on file  . Years of education: Not on file  . Highest education level: Not on file  Occupational History  . Not on file  Tobacco Use  . Smoking status: Never Smoker  .  Smokeless tobacco: Never Used  Substance and Sexual Activity  . Alcohol use: No  . Drug use: No  . Sexual activity: Not Currently  Other Topics Concern  . Not on file  Social History Narrative  . Not on file   Social Determinants of Health   Financial Resource Strain:   . Difficulty of Paying Living Expenses:   Food Insecurity:   . Worried About Charity fundraiser in the Last Year:   . Arboriculturist in the Last Year:     Transportation Needs:   . Film/video editor (Medical):   Marland Kitchen Lack of Transportation (Non-Medical):   Physical Activity:   . Days of Exercise per Week:   . Minutes of Exercise per Session:   Stress:   . Feeling of Stress :   Social Connections:   . Frequency of Communication with Friends and Family:   . Frequency of Social Gatherings with Friends and Family:   . Attends Religious Services:   . Active Member of Clubs or Organizations:   . Attends Archivist Meetings:   Marland Kitchen Marital Status:   Intimate Partner Violence:   . Fear of Current or Ex-Partner:   . Emotionally Abused:   Marland Kitchen Physically Abused:   . Sexually Abused:     FAMILY HISTORY:  Family History  Problem Relation Age of Onset  . Macular degeneration Mother   . Stroke Mother   . Hypertension Mother   . Dementia Mother   . Cancer Father        lung  . Diabetes Sister   . Hypertension Sister     CURRENT MEDICATIONS:  Current Outpatient Medications  Medication Sig Dispense Refill  . alendronate (FOSAMAX) 70 MG tablet Take 1 tablet (70 mg total) by mouth every 7 (seven) days. Take with a full glass of water on an empty stomach. 4 tablet 11  . atorvastatin (LIPITOR) 20 MG tablet Take 1 tablet (20 mg total) by mouth daily. 90 tablet 3  . cholecalciferol (VITAMIN D3) 25 MCG (1000 UT) tablet Take 1,000 Units by mouth daily.    . diclofenac Sodium (VOLTAREN) 1 % GEL Apply 4 g topically 4 (four) times daily. 400 g 2  . dorzolamide-timolol (COSOPT) 22.3-6.8 MG/ML ophthalmic solution Place 1 drop into both eyes daily. 10 mL 2  . ferrous sulfate 325 (65 FE) MG EC tablet Take 1 tablet (325 mg total) by mouth 2 (two) times daily with a meal. 180 tablet 3  . losartan-hydrochlorothiazide (HYZAAR) 100-25 MG tablet TAKE 1 TABLET DAILY.  Needs to be seen for further refills. (Patient taking differently: Take 1 tablet by mouth daily. ) 90 tablet 3  . potassium chloride SA (KLOR-CON) 20 MEQ tablet Take 1 tablet (20 mEq total)  by mouth daily. 90 tablet 1  . acetaminophen (TYLENOL) 500 MG tablet Take 1,000 mg by mouth every 6 (six) hours as needed for moderate pain.      No current facility-administered medications for this visit.    ALLERGIES:  Allergies  Allergen Reactions  . Lisinopril Cough    PHYSICAL EXAM:  Performance status (ECOG): 1 - Symptomatic but completely ambulatory  Vitals:   05/13/20 1259  BP: (!) 153/54  Pulse: 72  Resp: 18  Temp: (!) 97.3 F (36.3 C)  SpO2: 100%   Wt Readings from Last 3 Encounters:  05/13/20 134 lb 11.2 oz (61.1 kg)  04/27/20 134 lb (60.8 kg)  04/21/20 138 lb (62.6 kg)   Physical  Exam Vitals reviewed.  Constitutional:      Appearance: Normal appearance.  Cardiovascular:     Rate and Rhythm: Normal rate and regular rhythm.     Pulses: Normal pulses.     Heart sounds: Normal heart sounds.  Pulmonary:     Effort: Pulmonary effort is normal.     Breath sounds: Normal breath sounds.  Abdominal:     Palpations: Abdomen is soft. There is no mass.     Tenderness: There is no abdominal tenderness.  Neurological:     General: No focal deficit present.     Mental Status: She is alert and oriented to person, place, and time.  Psychiatric:        Mood and Affect: Mood normal.        Behavior: Behavior normal.      LABORATORY DATA:  I have reviewed the labs as listed.  CBC Latest Ref Rng & Units 04/20/2020 04/12/2020 04/11/2020  WBC 3.4 - 10.8 x10E3/uL 6.1 5.3 6.7  Hemoglobin 11.1 - 15.9 g/dL 7.9(LL) 7.5(L) 8.4(L)  Hematocrit 34.0 - 46.6 % 25.4(L) 25.4(L) 28.8(L)  Platelets 150 - 450 x10E3/uL 247 213 243   CMP Latest Ref Rng & Units 04/20/2020 04/12/2020 04/11/2020  Glucose 65 - 99 mg/dL 89 92 86  BUN 8 - 27 mg/dL _0 Creatinine 0.57 - 1.00 mg/dL 1.43(H) 0.93 1.03(H)  Sodium 134 - 144 mmol/L 139 138 136  Potassium 3.5 - 5.2 mmol/L 3.1(L) 4.3 4.3  Chloride 96 - 106 mmol/L 96 108 106  CO2 20 - 29 mmol/L 30(H) 24 24  Calcium 8.7 - 10.3 mg/dL 8.7 7.8(L)  8.1(L)  Total Protein 6.0 - 8.5 g/dL 6.0 - 5.0(L)  Total Bilirubin 0.0 - 1.2 mg/dL <0.2 - 0.4  Alkaline Phos 48 - 121 IU/L 86 - 50  AST 0 - 40 IU/L 10 - 9(L)  ALT 0 - 32 IU/L 5 - 7    DIAGNOSTIC IMAGING:  I have independently reviewed the scans and discussed with the patient. NM PET Image Initial (PI) Skull Base To Thigh  Result Date: 05/12/2020 CLINICAL DATA:  Initial treatment strategy for colon cancer. EXAM: NUCLEAR MEDICINE PET SKULL BASE TO THIGH TECHNIQUE: 7.27 mCi F-18 FDG was injected intravenously. Full-ring PET imaging was performed from the skull base to thigh after the radiotracer. CT data was obtained and used for attenuation correction and anatomic localization. Fasting blood glucose: 85 mg/dl COMPARISON:  04/06/2020 FINDINGS: Mediastinal blood pool activity: SUV max 2.81 Liver activity: SUV max NA NECK: No hypermetabolic lymph nodes in the neck. Incidental CT findings: none CHEST: No hypermetabolic mediastinal or hilar nodes. 4 mm LEFT lower lobe pulmonary nodule 4 mm (image 101, series 3) area not imaged on previous assessment. Area not associated with increased metabolic activity there with limited evaluation given the size. Additional nodule at the LEFT lung base measuring approximately 6 mm (image 108, series 3) and a third nodule at the RIGHT lower lobe approximately 6 mm uptake below blood pool, remaining nonspecific. Incidental CT findings: Calcified coronary artery disease. Mild cardiac enlargement. Mitral annular calcification. Extensive calcified atheromatous plaque of the thoracic aorta. Limited assessment of heart and great vessels in the chest without contrast. No signs of consolidation or evidence of pleural effusion in the chest. ABDOMEN/PELVIS: Focus of increased FDG uptake along the anastomotic suture line of the ileocolonic anastomosis following RIGHT hemicolectomy. Mild thickening of the colon in this location (image 162, series 3) 9 mm. (SUVmax = 5.9 mild increased  FDG  uptake along the midline of the abdomen associated with postoperative changes. No focal, suspicious uptake in the liver or within abdominal lymph nodes. Diffuse bladder wall thickening associated with moderate FDG uptake. Some of the uptake within the bladder is likely excreted FDG in the urine in.) Incidental CT findings: Small cysts in the RIGHT kidney. No hydronephrosis. No nephrolithiasis. Post RIGHT hemicolectomy as described. Mild peri-anastomotic stranding without focal fluid or sign of free air. Thickening along the posterior margin of the anastomotic site. See above. SKELETON: No focal hypermetabolic activity to suggest skeletal metastasis. Focus of increased FDG uptake associated with supraspinatus muscular or tendinous activity about the LEFT shoulder no focal abnormality on CT images. Incidental CT findings: none IMPRESSION: 1. Post RIGHT hemicolectomy with mild FDG uptake associated with the anastomotic site. This may represent mild edema or inflammation, attention on follow-up. 2. Small pulmonary nodules that do not show elevated FDG activity, remain nonspecific. Suggest dedicated chest CT for further assessment. Metastatic disease is considered but there are no priors for comparison. Comparison with prior imaging could also be beneficial if available. 3. Increased FDG uptake associated with supraspinatus about the LEFT shoulder likely related to tendinopathy given lack of CT correlate. Correlate with symptoms. This could potentially relate as well to the recent COVID vaccination in the LEFT arm, attention on follow-up. 4. Persistent but improved bladder wall thickening. No debris in the urinary bladder lumen with mild diffuse uptake remains suggestive of cystitis. Electronically Signed   By: Zetta Bills M.D.   On: 05/12/2020 12:34   Intravitreal Injection, Pharmacologic Agent - OD - Right Eye  Result Date: 04/30/2020 Time Out 04/30/2020. 8:26 AM. Confirmed correct patient, procedure, site, and  patient consented. Anesthesia Topical anesthesia was used. Anesthetic medications included Lidocaine 2%, Proparacaine 0.5%. Procedure Preparation included 5% betadine to ocular surface, eyelid speculum. A (33g) needle was used. Injection: 2 mg aflibercept Alfonse Flavors) SOLN   NDC: A3590391, Lot: [id number], Expiration date: 08/31/2020   Route: Intravitreal, Site: Right Eye, Waste: 0.05 mL Post-op Post injection exam found visual acuity of at least counting fingers. The patient tolerated the procedure well. There were no complications. The patient received written and verbal post procedure care education.   Intravitreal Injection, Pharmacologic Agent - OS - Left Eye  Result Date: 04/30/2020 Time Out 04/30/2020. 8:27 AM. Confirmed correct patient, procedure, site, and patient consented. Anesthesia Topical anesthesia was used. Anesthetic medications included Lidocaine 2%, Proparacaine 0.5%. Procedure Preparation included 5% betadine to ocular surface, eyelid speculum. A (33g) needle was used. Injection: 2 mg aflibercept Alfonse Flavors) SOLN   NDC: A3590391, Lot: [id number], Expiration date: 08/31/2020   Route: Intravitreal, Site: Left Eye, Waste: 0.05 mL Post-op Post injection exam found visual acuity of at least counting fingers. The patient tolerated the procedure well. There were no complications. The patient received written and verbal post procedure care education.   OCT, Retina - OU - Both Eyes  Result Date: 04/30/2020 Right Eye Quality was good. Central Foveal Thickness: 465. Progression has been stable. Findings include pigment epithelial detachment, epiretinal membrane, subretinal hyper-reflective material, retinal drusen , abnormal foveal contour, no SRF, intraretinal fluid, disciform scar, outer retinal tubulation, outer retinal atrophy (Stable sub-retinal scar, mild interval improvement in IRF). Left Eye Quality was good. Central Foveal Thickness: 403. Progression has been stable. Findings include subretinal  hyper-reflective material, pigment epithelial detachment, intraretinal fluid, retinal drusen , abnormal foveal contour, no SRF, outer retinal atrophy (persistent IRF overlying SRHM/PED). Notes *Images captured and stored on drive Diagnosis /  Impression: Exudative ARMD OU OD: Stable sub-retinal scar, minimal cystic changes overlying -- stable OS: tr persistent IRF overlying SRHM/PED Clinical management: See below Abbreviations: NFP - Normal foveal profile. CME - cystoid macular edema. PED - pigment epithelial detachment. IRF - intraretinal fluid. SRF - subretinal fluid. EZ - ellipsoid zone. ERM - epiretinal membrane. ORA - outer retinal atrophy. ORT - outer retinal tubulation. SRHM - subretinal hyper-reflective material     ASSESSMENT:  1.  Stage IIIb (T4AN1A) poorly differentiated right colon adenocarcinoma: -Right hemicolectomy on 04/07/2020 with poorly differentiated adenocarcinoma, pT4a, positive radial margin, 1/15 lymph nodes positive, loss of MLH1 and PMS2, bladder biopsy negative for malignancy. -CTAP on 04/06/2020 showed circumferential ascending colon mass with numerous borderline enlarged pericolonic lymph nodes.  No findings of hepatic metastatic disease. -CEA on 04/04/2020 was 12.2.  CEA improved to 1.7 on 05/13/2020. -PET scan on 05/11/2020 shows mild FDG uptake associated with the anastomotic site.  Small pulmonary nodules that do not show elevated FDG activity, remain nonspecific, subcentimeter.  Persistent but improved bladder wall thickening.  2.  Family history: -Paternal uncle had colon cancer.  Maternal aunt had brain cancer and another maternal aunt had gynecological malignancy.  Father had lung cancer and was a smoker.  3.  Diffuse erythema and nodularity of the dome of the bladder: -CT scan showed severely thickened bladder wall. -Cystoscopy on 04/08/2020 showed diffuse erythema, nodularity in the posterior wall tracking to the dome.  Ureteral orifices were in normal locations.  Biopsies  were benign.   PLAN:  1.  Stage IIIb (T4AN1A) poorly differentiated right colon adenocarcinoma: -We discussed results of the PET scan in detail.  There are subcentimeter lung nodules which are suspicious but not conclusive of metastatic disease. -She will proceed with adjuvant chemotherapy.  Because of T4 lesion, she will need 6 months of adjuvant therapy. -We discussed FOLFOX based chemotherapy and side effects in detail. -We will start it as soon as she has a port placed. -We will send for MLH1 methylation testing and BRAF mutation testing.  2.  Normocytic anemia: -We will request ferritin, iron panel. -This is from combination of CKD and iron deficiency. -Based on previous iron panel, will consider Feraheme weekly x2.  We discussed the side effects in detail.  She will receive her first infusion with first cycle of chemo.  3.  Family history: -Based on her family history, we will refer her for genetic testing.  4.  Diffuse erythema nodularity of the dome of the bladder: -She does not have any hematuria or urination problems.    Orders placed this encounter:  No orders of the defined types were placed in this encounter.  Total time spent 40 minutes with more than 50% of the time spent face-to-face discussing treatment plan, counseling and coordination of care.  Derek Jack, MD [place] [phone]   I, Milinda Antis, am acting as a scribe for Dr. Sanda Linger.  I, Derek Jack MD, have reviewed the above documentation for accuracy and completeness, and I agree with the above.

## 2020-05-13 NOTE — Patient Instructions (Signed)
[facility] Chemotherapy Teaching   You are diagnosed with Stage IIIb colon cancer.  You will be treated in the clinic every 2 weeks with a combination of chemotherapy drugs - oxaliplatin, leucovorin (not a chemo drug), and fluorouracil (5FU).  The intent of treatment is to cure your cancer.  You will see the doctor regularly throughout treatment.  We will obtain blood work from you prior to every treatment and monitor your results to make sure it is safe to give your treatment. The doctor monitors your response to treatment by the way you are feeling, your blood work, and by obtaining scans periodically.  There will be wait times while you are here for treatment.  It will take about 30 minutes to 1 hour for your lab work to result. Then there will be wait times while pharmacy mixes your medications.    Oxaliplatin (Eloxatin)  About This Drug  Oxaliplatin is used to treat cancer. It is given in the vein (IV).  It takes two hours to infuse.  Possible Side Effects   Bone marrow suppression. This is a decrease in the number of white blood cells, red blood cells, and platelets. This may raise your risk of infection, make you tired and weak (fatigue), and raise your risk of bleeding.   Tiredness   Soreness of the mouth and throat. You may have red areas, white patches, or sores that hurt.   Nausea and vomiting (throwing up)   Diarrhea (loose bowel movements)   Changes in your liver function   Effects on the nerves called peripheral neuropathy. You may feel numbness, tingling, or pain in your hands and feet, and may be worse in cold temperatures. It may be hard for you to button your clothes, open jars, or walk as usual. The effect on the nerves may get worse with more doses of the drug. These effects get better in some people after the drug is stopped but it does not get better in all people.  Note: Each of the side effects above was reported in 40% or greater of patients  treated with oxaliplatin. Not all possible side effects are included above.   Warnings and Precautions   Allergic reactions, including anaphylaxis, which may be life-threatening are rare but may happen in some patients. Signs of allergic reaction to this drug may be swelling of the face, feeling like your tongue or throat are swelling, trouble breathing, rash, itching, fever, chills, feeling dizzy, and/or feeling that your heart is beating in a fast or not normal way. If this happens, do not take another dose of this drug. You should get urgent medical treatment.   Inflammation (swelling) of the lungs, which may be life-threatening. You may have a dry cough or trouble breathing.   Effects on the nerves (neuropathy) may resolve within 14 days, or it may persist beyond 14 days.   Severe decrease in white blood cells when combined with the chemotherapy agents 5-fluorouracil and leucovorin. This may be life-threatening.   Severe changes in your liver function   Abnormal heart beat and/or EKG, which can be life-threatening   Rhabdomyolysis- damage to your muscles which may release proteins in your blood and affect how your kidneys work, which can be life-threatening. You may have severe muscle weakness and/or pain, or dark urine.  Important Information   This drug may impair your ability to drive or use machinery. Talk to your doctor and/or nurse about precautions you may need to take.   This  drug may be present in the saliva, tears, sweat, urine, stool, vomit, semen, and vaginal secretions. Talk to your doctor and/or your nurse about the necessary precautions to take during this time.  * The effects on the nerves can be aggravated by exposure to cold. Avoid cold beverages, use of ice, and make sure you cover your skin and dress warmly prior to being exposed to cold temperatures while you are receiving treatment with oxaliplatin*   Treating Side Effects   Manage tiredness by pacing  your activities for the day.   Be sure to include periods of rest between energy-draining activities.   To decrease the risk of infection, wash your hands regularly.   Avoid close contact with people who have a cold, the flu, or other infections.  Take your temperature as your doctor or nurse tells you, and whenever you feel like you may have a fever.   To help decrease the risk of bleeding, use a soft toothbrush. Check with your nurse before using dental floss.   Be very careful when using knives or tools.   Use an electric shaver instead of a razor.   Drink plenty of fluids (a minimum of eight glasses per day is recommended).   Mouth care is very important. Your mouth care should consist of routine, gentle cleaning of your teeth or dentures and rinsing your mouth with a mixture of 1/2 teaspoon of salt in 8 ounces of water or 1/2 teaspoon of baking soda in 8 ounces of water. This should be done at least after each meal and at bedtime.   If you have mouth sores, avoid mouthwash that has alcohol. Also avoid alcohol and smoking because they can bother your mouth and throat.   To help with nausea and vomiting, eat small, frequent meals instead of three large meals a day. Choose foods and drinks that are at room temperature. Ask your nurse or doctor about other helpful tips and medicine that is available to help stop or lessen these symptoms.   If you throw up or have loose bowel movements, you should drink more fluids so that you do not become dehydrated (lack of water in the body from losing too much fluid).   If you have diarrhea, eat low-fiber foods that are high in protein and calories and avoid foods that can irritate your digestive tracts or lead to cramping.   Ask your nurse or doctor about medicine that can lessen or stop your diarrhea.   If you have numbness and tingling in your hands and feet, be careful when cooking, walking, and handling sharp objects and hot  liquids.   Do not drink cold drinks or use ice in beverages. Drink fluids at room temperature or warmer, and drink through a straw.   Wear gloves to touch cold objects, and wear warm clothing and cover you skin during cold weather.   Food and Drug Interactions   There are no known interactions of oxaliplatin with food and other medications.   This drug may interact with other medicines. Tell your doctor and pharmacist about all the prescription and over-the-counter medicines and dietary supplements (vitamins, minerals, herbs and others) that you are taking at this time. Also, check with your doctor or pharmacist before starting any new prescription or over-the-counter medicines, or dietary supplements to make sure that there are no interactions   When to Call the Doctor  Call your doctor or nurse if you have any of these symptoms and/or any new or unusual  symptoms:   Fever of 100.4 F (38 C) or higher   Chills   Tiredness that interferes with your daily activities   Feeling dizzy or lightheaded   Easy bleeding or bruising   Feeling that your heart is beating in a fast or not normal way (palpitations)   Pain in your chest   Dry cough   Trouble breathing   Pain in your mouth or throat that makes it hard to eat or drink   Nausea that stops you from eating or drinking and/or is not relieved by prescribed medicines   Throwing up   Diarrhea, 4 times in one day or diarrhea with lack of strength or a feeling of being dizzy   Numbness, tingling, or pain in your hands and feet   Signs of possible liver problems: dark urine, pale bowel movements, bad stomach pain, feeling very tired and weak, unusual itching, or yellowing of the eyes or skin   Signs of rhabdomyolysis: decreased urine, very dark urine, muscle pain in the shoulders, thighs, or lower back; muscle weakness or trouble moving arms and legs   Signs of allergic reaction: swelling of the face,  feeling like your tongue or throat are swelling, trouble breathing, rash, itching, fever, chills, feeling dizzy, and/or feeling that your heart is beating in a fast or not normal way. If this happens, call 911 for emergency care.   If you think you may be pregnant  Reproduction Warnings   Pregnancy warning: This drug may have harmful effects on the unborn baby. Women of childbearing potential should use effective methods of birth control during your cancer treatment. Let your doctor know right away if you think you may be pregnant or may have impregnated your partner.   Breastfeeding warning: It is not known if this drug passes into breast milk. For this reason, women should talk to their doctor about the risks and benefits of breastfeeding during treatment with this drug because this drug may enter the breast milk and cause harm to a breastfeeding baby.   Fertility warning: Human fertility studies have not been done with this drug. Talk with your doctor or nurse if you plan to have children. Ask for information on sperm or egg banking.   Leucovorin Calcium  About This Drug  Leucovorin is a vitamin. It is used in this chemotherapy regimen to help the fluorouracil (5FU) work better. Leucovorin is given in the vein (IV).  This drug runs at the same time as the oxaliplatin and takes 2 hours to infuse.   Possible Side Effects  Rash and itching  Note: Leucovorin by itself has very few side effects. Other side effects you may have can be caused by the other drugs you are taking, such as 5-fluorouracil.   Warnings and Precautions   Allergic reactions, including anaphylaxis are rare but may happen in some patients. Signs of allergic reaction to this drug may be swelling of the face, feeling like your tongue or throat are swelling, trouble breathing, rash, itching, fever, chills, feeling dizzy, and/or feeling that your heart is beating in a fast or not normal way. If this happens, do not take  another dose of this drug. You should get urgent medical treatment.  Food and Drug Interactions   There are no known interactions of leucovorin with food.   This drug may interact with other medicines. Tell your doctor and pharmacist about all the prescription and over-the-counter medicines and dietary supplements (vitamins, minerals, herbs and others) that you are  taking at this time.   Also, check with your doctor or pharmacist before starting any new prescription or over-the-counter medicines, or dietary supplements to make sure that there are no interactions.   When to Call the Doctor  Call your doctor or nurse if you have any of these symptoms and/or any new or unusual symptoms:   A new rash or a rash that is not relieved by prescribed medicines   Signs of allergic reaction: swelling of the face, feeling like your tongue or throat are swelling, trouble breathing, rash, itching, fever, chills, feeling dizzy, and/or feeling that your heart is beating in a fast or not normal way. If this happens, call 911 for emergency care.   If you think you may be pregnant   Reproduction Warnings   Pregnancy warning: It is not known if this drug may harm an unborn child. For this reason, be sure to talk with your doctor if you are pregnant or planning to become pregnant while receiving this drug. Let your doctor know right away if you think you may be pregnant   Breastfeeding warning: It is not known if this drug passes into breast milk. For this reason, women should talk to their doctor about the risks and benefits of breastfeeding during treatment with this drug because this drug may enter the breast milk and cause harm to a breastfeeding baby.   Fertility warning: Human fertility studies have not been done with this drug. Talk with your doctor or nurse if you plan to have children. Ask for information on sperm or egg banking.   5-Fluorouracil (Adrucil; 5FU)  About This  Drug  Fluorouracil is used to treat cancer. It is given in the vein (IV). It is given as an IV push in the clinic from a syringe and also as a continuous infusion given via an ambulatory pump (a pump you take home and wear for a specified amount of time).  Possible Side Effects   Bone marrow suppression. This is a decrease in the number of white blood cells, red blood cells, and platelets. This may raise your risk of infection, make you tired and weak (fatigue), and raise your risk of bleeding   Changes in the tissue of the heart and/or heart attack. Some changes may happen that can cause your heart to have less ability to pump blood.   Blurred vision or other changes in eyesight   Nausea and throwing up (vomiting)   Diarrhea (loose bowel movements)   Ulcers - sores that may cause pain or bleeding in your digestive tract, which includes your mouth, esophagus, stomach, small/large intestines and rectum   Soreness of the mouth and throat. You may have red areas, white patches, or sores that hurt.   Allergic reactions, including anaphylaxis are rare but may happen in some patients. Signs of allergic reaction to this drug may be swelling of the face, feeling like your tongue or throat are swelling, trouble breathing, rash, itching, fever, chills, feeling dizzy, and/or feeling that your heart is beating in a fast or not normal way. If this happens, do not take another dose of this drug. You should get urgent medical treatment.   Sensitivity to light (photosensitivity). Photosensitivity means that you may become more sensitive to the sun and/or light. You may get a skin rash/reaction if you are in the sun or are exposed to sun lamps and tanning beds. Your eyes may water more, mostly in bright light.   Changes in your nail color,  nail loss and/or brittle nail   Darkening of the skin, or changes to the color of your skin and/or veins used for infusion   Rash, dry skin, or  itching  Note: Not all possible side effects are included above.  Warnings and Precautions   Hand-and-foot syndrome. The palms of your hands or soles of your feet may tingle, become numb, painful, swollen, or red.   Changes in your central nervous system can happen. The central nervous system is made up of your brain and spinal cord. You could feel extreme tiredness, agitation, confusion, hallucinations (see or hear things that are not there), trouble understanding or speaking, loss of control of your bowels or bladder, eyesight changes, numbness or lack of strength to your arms, legs, face, or body, or coma. If you start to have any of these symptoms let your doctor know right away.   Side effects of this drug may be unexpectedly severe in some patients  Note: Some of the side effects above are very rare. If you have concerns and/or questions, please discuss them with your medical team.   Important Information   This drug may be present in the saliva, tears, sweat, urine, stool, vomit, semen, and vaginal secretions. Talk to your doctor and/or your nurse about the necessary precautions to take during this time.   Treating Side Effects   Manage tiredness by pacing your activities for the day.   Be sure to include periods of rest between energy-draining activities.   To help decrease the risk of infections, wash your hands regularly.   Avoid close contact with people who have a cold, the flu, or other infections.   Take your temperature as your doctor or nurse tells you, and whenever you feel like you may have a fever.   Use a soft toothbrush. Check with your nurse before using dental floss.   Be very careful when using knives or tools.   Use an electric shaver instead of a razor.   If you have a nose bleed, sit with your head tipped slightly forward. Apply pressure by lightly pinching the bridge of your nose between your thumb and forefinger. Call your doctor if you  feel dizzy or faint or if the bleeding doesnt stop after 10 to 15 minutes.   Drink plenty of fluids (a minimum of eight glasses per day is recommended).   If you throw up or have loose bowel movements, you should drink more fluids so that you do not  become dehydrated (lack of water in the body from losing too much fluid).   To help with nausea and vomiting, eat small, frequent meals instead of three large meals a day. Choose foods and drinks that are at room temperature. Ask your nurse or doctor about other helpful tips and medicine that is available to help, stop, or lessen these symptoms.   If you have diarrhea, eat low-fiber foods that are high in protein and calories and avoid foods that can irritate your digestive tracts or lead to cramping.   Ask your nurse or doctor about medicine that can lessen or stop your diarrhea.   Mouth care is very important. Your mouth care should consist of routine, gentle cleaning of your teeth or dentures and rinsing your mouth with a mixture of 1/2 teaspoon of salt in 8 ounces of water or 1/2 teaspoon of baking soda in 8 ounces of water. This should be done at least after each meal and at bedtime.   If you  have mouth sores, avoid mouthwash that has alcohol. Also avoid alcohol and smoking because they can bother your mouth and throat.   Keeping your nails moisturized may help with brittleness.   To help with itching, moisturize your skin several times day.   Use sunscreen with SPF 30 or higher when you are outdoors even for a short time. Cover up when you are out in the sun. Wear wide-brimmed hats, long-sleeved shirts, and pants. Keep your neck, chest, and back covered. Wear dark sun glasses when in the sun or bright lights.   If you get a rash do not put anything on it unless your doctor or nurse says you may. Keep the area around the rash clean and dry. Ask your doctor for medicine if your rash bothers you.   Keeping your pain under  control is important to your well-being. Please tell your doctor or nurse if you are experiencing pain.   Food and Drug Interactions   There are no known interactions of fluorouracil with food.   Check with your doctor or pharmacist about all other prescription medicines and over-the-counter medicines and dietary supplements (vitamins, minerals, herbs and others) you are taking before starting this medicine as there are known drug interactions with 5-fluoroucacil. Also, check with your doctor or pharmacist before starting any new prescription or over-the-counter medicines, or dietary supplements to make sure that there are no interactions.  When to Call the Doctor  Call your doctor or nurse if you have any of these symptoms and/or any new or unusual symptoms:   Fever of 100.4 F (38 C) or higher   Chills   Easy bleeding or bruising   Nose bleed that doesnt stop bleeding after 10-15 minutes   Trouble breathing   Feeling dizzy or lightheaded   Feeling that your heart is beating in a fast or not normal way (palpitations)   Chest pain or symptoms of a heart attack. Most heart attacks involve pain in the center of the chest that lasts more than a few minutes. The pain may go away and come back or it can be constant. It can feel like pressure, squeezing, fullness, or pain. Sometimes pain is felt in one or both arms, the back, neck, jaw, or stomach. If any of these symptoms last 2 minutes, call 911.   Confusion and/or agitation   Hallucinations   Trouble understanding or speaking   Loss of control of bowels or bladder   Blurry vision or changes in your eyesight   Headache that does not go away   Numbness or lack of strength to your arms, legs, face, or body   Nausea that stops you from eating or drinking and/or is not relieved by prescribed medicines   Throwing up   Diarrhea, 4 times in one day or diarrhea with lack of strength or a feeling of being  dizzy   Pain in your mouth or throat that makes it hard to eat or drink   Pain along the digestive tract - especially if worse after eating   Blood in your vomit (bright red or coffee-ground) and/or stools (bright red, or black/tarry)   Coughing up blood   Tiredness that interferes with your daily activities   Painful, red, or swollen areas on your hands or feet or around your nails   A new rash or a rash that is not relieved by prescribed medicines   Develop sensitivity to sunlight/light   Numbness and/or tingling of your hands and/or feet  Signs of allergic reaction: swelling of the face, feeling like your tongue or throat are swelling, trouble breathing, rash, itching, fever, chills, feeling dizzy, and/or feeling that your heart is beating in a fast or not normal way. If this happens, call 911 for emergency care.   If you think you are pregnant or may have impregnated your partner  Reproduction Warnings   Pregnancy warning: This drug may have harmful effects on the unborn baby. Women of child bearing potential should use effective methods of birth control during your cancer treatment and 3 months after treatment. Men with female partners of childbearing potential should use effective methods of birth control during your cancer treatment and for 3 months after your cancer treatment. Let your doctor know right away if you think you may be pregnant or may have impregnated your partner.   Breastfeeding warning: It is not known if this drug passes into breast milk. For this reason, Women should not breastfeed during treatment because this drug could enter the breast milk and cause harm to a breastfeeding baby.   Fertility warning: In men and women both, this drug may affect your ability to have children in the future. Talk with your doctor or nurse if you plan to have children. Ask for information on sperm or egg banking.   SELF CARE ACTIVITIES WHILE RECEIVING  CHEMOTHERAPY:  Hydration Increase your fluid intake 48 hours prior to treatment and drink at least 8 to 12 cups (64 ounces) of water/decaffeinated beverages per day after treatment. You can still have your cup of coffee or soda but these beverages do not count as part of your 8 to 12 cups that you need to drink daily. No alcohol intake.  Medications Continue taking your normal prescription medication as prescribed.  If you start any new herbal or new supplements please let us know first to make sure it is safe.  Mouth Care Have teeth cleaned professionally before starting treatment. Keep dentures and partial plates clean. Use soft toothbrush and do not use mouthwashes that contain alcohol. Biotene is a good mouthwash that is available at most pharmacies or may be ordered by calling [phone]. Use warm salt water gargles (1 teaspoon salt per 1 quart warm water) before and after meals and at bedtime. If you need dental work, please let the doctor know before you go for your appointment so that we can coordinate the best possible time for you in regards to your chemo regimen. You need to also let your dentist know that you are actively taking chemo. We may need to do labs prior to your dental appointment.  Skin Care Always use sunscreen that has not expired and with SPF (Sun Protection Factor) of 50 or higher. Wear hats to protect your head from the sun. Remember to use sunscreen on your hands, ears, face, & feet.  Use good moisturizing lotions such as udder cream, eucerin, or even Vaseline. Some chemotherapies can cause dry skin, color changes in your skin and nails.     Avoid long, hot showers or baths.  Use gentle, fragrance-free soaps and laundry detergent.  Use moisturizers, preferably creams or ointments rather than lotions because the thicker consistency is better at preventing skin dehydration. Apply the cream or ointment within 15 minutes of showering. Reapply moisturizer at night, and  moisturize your hands every time after you wash them.  Hair Loss (if your doctor says your hair will fall out)   If your doctor says that your hair is likely  to fall out, decide before you begin chemo whether you want to wear a wig. You may want to shop before treatment to match your hair color.  Hats, turbans, and scarves can also camouflage hair loss, although some people prefer to leave their heads uncovered. If you go bare-headed outdoors, be sure to use sunscreen on your scalp.  Cut your hair short. It eases the inconvenience of shedding lots of hair, but it also can reduce the emotional impact of watching your hair fall out.  Don't perm or color your hair during chemotherapy. Those chemical treatments are already damaging to hair and can enhance hair loss. Once your chemo treatments are done and your hair has grown back, it's OK to resume dyeing or perming hair.  With chemotherapy, hair loss is almost always temporary. But when it grows back, it may be a different color or texture. In older adults who still had hair color before chemotherapy, the new growth may be completely gray.  Often, new hair is very fine and soft.  Infection Prevention Please wash your hands for at least 30 seconds using warm soapy water. Handwashing is the #1 way to prevent the spread of germs. Stay away from sick people or people who are getting over a cold. If you develop respiratory systems such as green/yellow mucus production or productive cough or persistent cough let us know and we will see if you need an antibiotic. It is a good idea to keep a pair of gloves on when going into grocery stores/[employer] to decrease your risk of coming into contact with germs on the carts, etc. Carry alcohol hand gel with you at all times and use it frequently if out in public. If your temperature reaches 100.5 or higher please call the clinic and let us know.  If it is after hours or on the weekend please go to the ER if your  temperature is over 100.5.  Please have your own personal thermometer at home to use.    Sex and bodily fluids If you are going to have sex, a condom must be used to protect the person that isn't taking chemotherapy. Chemo can decrease your libido (sex drive). For a few days after chemotherapy, chemotherapy can be excreted through your bodily fluids.  When using the toilet please close the lid and flush the toilet twice.  Do this for a few day after you have had chemotherapy.   Effects of chemotherapy on your sex life Some changes are simple and won't last long. They won't affect your sex life permanently.  Sometimes you may feel:  too tired  not strong enough to be very active  sick or sore   not in the mood  anxious or low Your anxiety might not seem related to sex. For example, you may be worried about the cancer and how your treatment is going. Or you may be worried about money, or about how you family are coping with your illness.  These things can cause stress, which can affect your interest in sex. It's important to talk to your partner about how you feel.  Remember - the changes to your sex life don't usually last long. There's usually no medical reason to stop having sex during chemo. The drugs won't have any long term physical effects on your performance or enjoyment of sex. Cancer can't be passed on to your partner during sex  Contraception It's important to use reliable contraception during treatment. Avoid getting pregnant while you or your  partner are having chemotherapy. This is because the drugs may harm the baby. Sometimes chemotherapy drugs can leave a man or woman infertile.  This means you would not be able to have children in the future. You might want to talk to someone about permanent infertility. It can be very difficult to learn that you may no longer be able to have children. Some people find counselling helpful. There might be ways to preserve your fertility, although  this is easier for men than for women. You may want to speak to a fertility expert. You can talk about sperm banking or harvesting your eggs. You can also ask about other fertility options, such as donor eggs. If you have or have had breast cancer, your doctor might advise you not to take the contraceptive pill. This is because the hormones in it might affect the cancer. It is not known for sure whether or not chemotherapy drugs can be passed on through semen or secretions from the vagina. Because of this some doctors advise people to use a barrier method if you have sex during treatment. This applies to vaginal, anal or oral sex. Generally, doctors advise a barrier method only for the time you are actually having the treatment and for about a week after your treatment. Advice like this can be worrying, but this does not mean that you have to avoid being intimate with your partner. You can still have close contact with your partner and continue to enjoy sex.  Animals If you have cats or birds we just ask that you not change the litter or change the cage.  Please have someone else do this for you while you are on chemotherapy.   Food Safety During and After Cancer Treatment Food safety is important for people both during and after cancer treatment. Cancer and cancer treatments, such as chemotherapy, radiation therapy, and stem cell/bone marrow transplantation, often weaken the immune system. This makes it harder for your body to protect itself from foodborne illness, also called food poisoning. Foodborne illness is caused by eating food that contains harmful bacteria, parasites, or viruses.  Foods to avoid Some foods have a higher risk of becoming tainted with bacteria. These include:  Unwashed fresh fruit and vegetables, especially leafy vegetables that can hide dirt and other contaminants  Raw sprouts, such as alfalfa sprouts  Raw or undercooked beef, especially ground beef, or other raw or  undercooked meat and poultry  Fatty, fried, or spicy foods immediately before or after treatment.  These can sit heavy on your stomach and make you feel nauseous.  Raw or undercooked shellfish, such as oysters.  Sushi and sashimi, which often contain raw fish.   Unpasteurized beverages, such as unpasteurized fruit juices, raw milk, raw yogurt, or cider  Undercooked eggs, such as soft boiled, over easy, and poached; raw, unpasteurized eggs; or foods made with raw egg, such as homemade raw cookie dough and homemade mayonnaise  Simple steps for food safety  Shop smart.  Do not buy food stored or displayed in an unclean area.  Do not buy bruised or damaged fruits or vegetables.  Do not buy cans that have cracks, dents, or bulges.  Pick up foods that can spoil at the end of your shopping trip and store them in a cooler on the way home.  Prepare and clean up foods carefully.  Rinse all fresh fruits and vegetables under running water, and dry them with a clean towel or paper towel.  Clean the top  of cans before opening them.  After preparing food, wash your hands for 20 seconds with hot water and soap. Pay special attention to areas between fingers and under nails.  Clean your utensils and dishes with hot water and soap.  Disinfect your kitchen and cutting boards using 1 teaspoon of liquid, unscented bleach mixed into 1 quart of water.    Dispose of old food.  Eat canned and packaged food before its expiration date (the use by or best before date).  Consume refrigerated leftovers within 3 to 4 days. After that time, throw out the food. Even if the food does not smell or look spoiled, it still may be unsafe. Some bacteria, such as Listeria, can grow even on foods stored in the refrigerator if they are kept for too long.  Take precautions when eating out.  At restaurants, avoid buffets and salad bars where food sits out for a long time and comes in contact with many people.  Food can become contaminated when someone with a virus, often a norovirus, or another bug handles it.  Put any leftover food in a to-go container yourself, rather than having the server do it. And, refrigerate leftovers as soon as you get home.  Choose restaurants that are clean and that are willing to prepare your food as you order it cooked.   AT HOME MEDICATIONS:                                                                                                                                                                Compazine/Prochlorperazine 10mg  tablet. Take 1 tablet every 6 hours as needed for nausea/vomiting. (This can make you sleepy)   EMLA cream. Apply a quarter size amount to port site 1 hour prior to chemo. Do not rub in. Cover with plastic wrap.    Diarrhea Sheet   If you are having loose stools/diarrhea, please purchase Imodium and begin taking as outlined:  At the first sign of poorly formed or loose stools you should begin taking Imodium (loperamide) 2 mg capsules.  Take two tablets (4mg ) followed by one tablet (2mg ) every 2 hours - DO NOT EXCEED 8 tablets in 24 hours.  If it is bedtime and you are having loose stools, take 2 tablets at bedtime, then 2 tablets every 4 hours until morning.   Always call the [facility] if you are having loose stools/diarrhea that you can't get under control.  Loose stools/diarrhea leads to dehydration (loss of water) in your body.  We have other options of trying to get the loose stools/diarrhea to stop but you must let us know!   Constipation Sheet  Colace - 100 mg capsules - take 2 capsules daily.  If this doesn't help then you can increase to 2  capsules twice daily.  Please call if the above does not work for you. Do not go more than 2 days without a bowel movement.  It is very important that you do not become constipated.  It will make you feel sick to your stomach (nausea) and can cause abdominal pain and vomiting.  Nausea  Sheet   Compazine/Prochlorperazine 10mg  tablet. Take 1 tablet every 6 hours as needed for nausea/vomiting (This can make you drowsy).  If you are having persistent nausea (nausea that does not stop) please call the [facility] and let us know the amount of nausea that you are experiencing.  If you begin to vomit, you need to call the [facility] and if it is the weekend and you have vomited more than one time and can't get it to stop-go to the Emergency Room.  Persistent nausea/vomiting can lead to dehydration (loss of fluid in your body) and will make you feel very weak and unwell. Ice chips, sips of clear liquids, foods that are at room temperature, crackers, and toast tend to be better tolerated.   SYMPTOMS TO REPORT AS SOON AS POSSIBLE AFTER TREATMENT:  FEVER GREATER THAN 100.4 F  CHILLS WITH OR WITHOUT FEVER  NAUSEA AND VOMITING THAT IS NOT CONTROLLED WITH YOUR NAUSEA MEDICATION  UNUSUAL SHORTNESS OF BREATH  UNUSUAL BRUISING OR BLEEDING  TENDERNESS IN MOUTH AND THROAT WITH OR WITHOUT PRESENCE OF ULCERS  URINARY PROBLEMS  BOWEL PROBLEMS  UNUSUAL RASH     Wear comfortable clothing and clothing appropriate for easy access to any Portacath or PICC line. Let us know if there is anything that we can do to make your therapy better!   What to do if you need assistance after hours or on the weekends: CALL [phone].  HOLD on the line, do not hang up.  You will hear multiple messages but at the end you will be connected with a nurse triage line.  They will contact the doctor if necessary.  Most of the time they will be able to assist you.  Do not call the hospital operator.      I have been informed and understand all of the instructions given to me and have received a copy. I have been instructed to call the clinic [phone] or my family physician as soon as possible for continued medical care, if indicated. I do not have any more questions at this time but understand  that I may call the [facility] or the Patient Navigator at [phone] during office hours should I have questions or need assistance in obtaining follow-up care.

## 2020-05-14 ENCOUNTER — Other Ambulatory Visit: Payer: Self-pay

## 2020-05-14 ENCOUNTER — Other Ambulatory Visit (HOSPITAL_COMMUNITY)
Admission: RE | Admit: 2020-05-14 | Discharge: 2020-05-14 | Disposition: A | Discharge status: Home / Self Care | Payer: Medicare HMO | Source: Ambulatory Visit | Attending: General Surgery | Admitting: General Surgery

## 2020-05-14 ENCOUNTER — Encounter (HOSPITAL_COMMUNITY)
Admission: RE | Admit: 2020-05-14 | Discharge: 2020-05-14 | Disposition: A | Discharge status: Home / Self Care | Payer: Medicare HMO | Source: Ambulatory Visit | Attending: General Surgery | Admitting: General Surgery

## 2020-05-14 ENCOUNTER — Inpatient Hospital Stay (HOSPITAL_COMMUNITY): Payer: Medicare HMO

## 2020-05-14 ENCOUNTER — Encounter (HOSPITAL_COMMUNITY): Payer: Self-pay

## 2020-05-14 DIAGNOSIS — I088 Other rheumatic multiple valve diseases: Secondary | ICD-10-CM | POA: Diagnosis not present

## 2020-05-14 DIAGNOSIS — I700 Atherosclerosis of aorta: Secondary | ICD-10-CM | POA: Diagnosis not present

## 2020-05-14 DIAGNOSIS — D630 Anemia in neoplastic disease: Secondary | ICD-10-CM | POA: Diagnosis not present

## 2020-05-14 DIAGNOSIS — N1832 Chronic kidney disease, stage 3b: Secondary | ICD-10-CM | POA: Diagnosis not present

## 2020-05-14 DIAGNOSIS — Z20822 Contact with and (suspected) exposure to covid-19: Secondary | ICD-10-CM | POA: Insufficient documentation

## 2020-05-14 DIAGNOSIS — Z01818 Encounter for other preprocedural examination: Secondary | ICD-10-CM | POA: Insufficient documentation

## 2020-05-14 DIAGNOSIS — D631 Anemia in chronic kidney disease: Secondary | ICD-10-CM | POA: Diagnosis not present

## 2020-05-14 DIAGNOSIS — C182 Malignant neoplasm of ascending colon: Secondary | ICD-10-CM | POA: Diagnosis not present

## 2020-05-14 DIAGNOSIS — I708 Atherosclerosis of other arteries: Secondary | ICD-10-CM | POA: Diagnosis not present

## 2020-05-14 DIAGNOSIS — I1310 Hypertensive heart and chronic kidney disease without heart failure, with stage 1 through stage 4 chronic kidney disease, or unspecified chronic kidney disease: Secondary | ICD-10-CM | POA: Diagnosis not present

## 2020-05-14 DIAGNOSIS — Z483 Aftercare following surgery for neoplasm: Secondary | ICD-10-CM | POA: Diagnosis not present

## 2020-05-14 HISTORY — DX: Unspecified osteoarthritis, unspecified site: M19.90

## 2020-05-14 LAB — BASIC METABOLIC PANEL
Anion gap: 11 (ref 5–15)
BUN: 26 mg/dL — ABNORMAL HIGH (ref 8–23)
CO2: 29 mmol/L (ref 22–32)
Calcium: 9.6 mg/dL (ref 8.9–10.3)
Chloride: 98 mmol/L (ref 98–111)
Creatinine, Ser: 1.28 mg/dL — ABNORMAL HIGH (ref 0.44–1.00)
GFR calc Af Amer: 48 mL/min — ABNORMAL LOW (ref >60)
GFR calc non Af Amer: 41 mL/min — ABNORMAL LOW (ref >60)
Glucose, Bld: 114 mg/dL — ABNORMAL HIGH (ref 70–99)
Potassium: 3.1 mmol/L — ABNORMAL LOW (ref 3.5–5.1)
Sodium: 138 mmol/L (ref 135–145)

## 2020-05-14 LAB — CEA: CEA: 1.7 ng/mL (ref 0.0–4.7)

## 2020-05-14 MED ORDER — PROCHLORPERAZINE MALEATE 10 MG PO TABS: 10.0000 mg | ORAL_TABLET | Freq: Four times a day (QID) | ORAL | 2 refills | Status: DC | PRN

## 2020-05-14 MED ORDER — LIDOCAINE-PRILOCAINE 2.5-2.5 % EX CREA
TOPICAL_CREAM | CUTANEOUS | 0 refills | Status: DC
Start: 2020-05-14 — End: 2022-08-30

## 2020-05-14 NOTE — Progress Notes (Signed)
Chemotherapy education packet given and discussed with pt and family in detail.  Discussed diagnosis and staging, tx regimen, and intent of tx.  Reviewed chemotherapy medications and side effects, as well as pre-medications.  Instructed on how to manage side effects at home, and when to call the clinic.  Importance of fever/chills discussed with pt and family. Discussed precautions to implement at home after receiving tx, as well as self care strategies. Phone numbers provided for clinic during regular working hours, also how to reach the clinic after hours and on weekends. Pt and family provided the opportunity to ask questions - all questions answered to pt's and family satisfaction.   ?

## 2020-05-15 LAB — SARS CORONAVIRUS 2 (TAT 6-24 HRS): SARS Coronavirus 2: NEGATIVE

## 2020-05-18 ENCOUNTER — Encounter (HOSPITAL_COMMUNITY)
Admission: RE | Disposition: A | Discharge status: Home / Self Care | Payer: Self-pay | Source: Home / Self Care | Attending: General Surgery

## 2020-05-18 ENCOUNTER — Encounter (HOSPITAL_COMMUNITY): Payer: Self-pay | Admitting: General Surgery

## 2020-05-18 ENCOUNTER — Ambulatory Visit (HOSPITAL_COMMUNITY): Payer: Medicare HMO | Admitting: Certified Registered"

## 2020-05-18 ENCOUNTER — Ambulatory Visit (HOSPITAL_COMMUNITY): Payer: Medicare HMO

## 2020-05-18 ENCOUNTER — Ambulatory Visit (HOSPITAL_COMMUNITY)
Admission: RE | Admit: 2020-05-18 | Discharge: 2020-05-18 | Disposition: A | Discharge status: Home / Self Care | Payer: Medicare HMO | Attending: General Surgery | Admitting: General Surgery

## 2020-05-18 DIAGNOSIS — Z452 Encounter for adjustment and management of vascular access device: Secondary | ICD-10-CM | POA: Diagnosis not present

## 2020-05-18 DIAGNOSIS — Z95828 Presence of other vascular implants and grafts: Secondary | ICD-10-CM

## 2020-05-18 DIAGNOSIS — C182 Malignant neoplasm of ascending colon: Secondary | ICD-10-CM | POA: Diagnosis not present

## 2020-05-18 DIAGNOSIS — I10 Essential (primary) hypertension: Secondary | ICD-10-CM | POA: Diagnosis not present

## 2020-05-18 DIAGNOSIS — I872 Venous insufficiency (chronic) (peripheral): Secondary | ICD-10-CM | POA: Diagnosis not present

## 2020-05-18 DIAGNOSIS — Z79899 Other long term (current) drug therapy: Secondary | ICD-10-CM | POA: Diagnosis not present

## 2020-05-18 DIAGNOSIS — C189 Malignant neoplasm of colon, unspecified: Secondary | ICD-10-CM | POA: Diagnosis not present

## 2020-05-18 DIAGNOSIS — E785 Hyperlipidemia, unspecified: Secondary | ICD-10-CM | POA: Diagnosis not present

## 2020-05-18 HISTORY — PX: PORTACATH PLACEMENT: SHX2246

## 2020-05-18 SURGERY — INSERTION, TUNNELED CENTRAL VENOUS DEVICE, WITH PORT
Anesthesia: General | Site: Chest | Laterality: Left

## 2020-05-18 MED ORDER — MIDAZOLAM HCL 2 MG/2ML IJ SOLN
INTRAMUSCULAR | Status: AC
  Filled 2020-05-18: qty 2

## 2020-05-18 MED ORDER — PROPOFOL 500 MG/50ML IV EMUL
INTRAVENOUS | Status: DC | PRN
  Administered 2020-05-18: 100 ug/kg/min via INTRAVENOUS

## 2020-05-18 MED ORDER — HEPARIN SOD (PORK) LOCK FLUSH 100 UNIT/ML IV SOLN
INTRAVENOUS | Status: AC
  Filled 2020-05-18: qty 5

## 2020-05-18 MED ORDER — FENTANYL CITRATE (PF) 100 MCG/2ML IJ SOLN
INTRAMUSCULAR | Status: AC
  Filled 2020-05-18: qty 2

## 2020-05-18 MED ORDER — CEFAZOLIN SODIUM-DEXTROSE 2-4 GM/100ML-% IV SOLN
2.0000 g | INTRAVENOUS | Status: AC
  Administered 2020-05-18: 2 g via INTRAVENOUS
  Filled 2020-05-18: qty 100

## 2020-05-18 MED ORDER — LACTATED RINGERS IV SOLN: INTRAVENOUS | Status: DC

## 2020-05-18 MED ORDER — LIDOCAINE HCL (PF) 1 % IJ SOLN
INTRAMUSCULAR | Status: AC
  Filled 2020-05-18: qty 30

## 2020-05-18 MED ORDER — HEPARIN SOD (PORK) LOCK FLUSH 100 UNIT/ML IV SOLN
INTRAVENOUS | Status: DC | PRN
  Administered 2020-05-18: 500 [IU] via INTRAVENOUS

## 2020-05-18 MED ORDER — ONDANSETRON HCL 4 MG/2ML IJ SOLN: 4.0000 mg | Freq: Once | INTRAMUSCULAR | Status: DC | PRN

## 2020-05-18 MED ORDER — LIDOCAINE HCL (PF) 1 % IJ SOLN
INTRAMUSCULAR | Status: DC | PRN
  Administered 2020-05-18: 6 mL

## 2020-05-18 MED ORDER — MIDAZOLAM HCL 5 MG/5ML IJ SOLN
INTRAMUSCULAR | Status: DC | PRN
  Administered 2020-05-18: 2 mg via INTRAVENOUS

## 2020-05-18 MED ORDER — SODIUM CHLORIDE (PF) 0.9 % IJ SOLN
INTRAMUSCULAR | Status: DC | PRN
  Administered 2020-05-18: 500 mL

## 2020-05-18 MED ORDER — CHLORHEXIDINE GLUCONATE 0.12 % MT SOLN
15.0000 mL | Freq: Once | OROMUCOSAL | Status: AC
  Administered 2020-05-18: 15 mL via OROMUCOSAL

## 2020-05-18 MED ORDER — CHLORHEXIDINE GLUCONATE CLOTH 2 % EX PADS: 6.0000 | MEDICATED_PAD | Freq: Once | CUTANEOUS | Status: DC

## 2020-05-18 MED ORDER — FENTANYL CITRATE (PF) 100 MCG/2ML IJ SOLN: 25.0000 ug | INTRAMUSCULAR | Status: DC | PRN

## 2020-05-18 MED ORDER — PROPOFOL 10 MG/ML IV BOLUS
INTRAVENOUS | Status: AC
  Filled 2020-05-18: qty 40

## 2020-05-18 MED ORDER — FENTANYL CITRATE (PF) 100 MCG/2ML IJ SOLN
INTRAMUSCULAR | Status: DC | PRN
  Administered 2020-05-18 (×2): 50 ug via INTRAVENOUS

## 2020-05-18 MED ORDER — HYDROCODONE-ACETAMINOPHEN 5-325 MG PO TABS: 1.0000 | ORAL_TABLET | ORAL | 0 refills | Status: DC | PRN

## 2020-05-18 MED ORDER — ORAL CARE MOUTH RINSE: 15.0000 mL | Freq: Once | OROMUCOSAL | Status: AC

## 2020-05-18 SURGICAL SUPPLY — 42 items
ADH SKN CLS APL DERMABOND .7 (GAUZE/BANDAGES/DRESSINGS) ×1
APL PRP STRL LF ISPRP CHG 10.5 (MISCELLANEOUS) ×1
APPLICATOR CHLORAPREP 10.5 ORG (MISCELLANEOUS) ×3 IMPLANT
APPLIER CLIP 9.375 SM OPEN (CLIP)
APR CLP SM 9.3 20 MLT OPN (CLIP)
BAG DECANTER FOR FLEXI CONT (MISCELLANEOUS) ×3 IMPLANT
CLIP APPLIE 9.375 SM OPEN (CLIP) IMPLANT
CLOTH BEACON ORANGE TIMEOUT ST (SAFETY) ×3 IMPLANT
COVER LIGHT HANDLE STERIS (MISCELLANEOUS) ×6 IMPLANT
COVER WAND RF STERILE (DRAPES) ×3 IMPLANT
DECANTER SPIKE VIAL GLASS SM (MISCELLANEOUS) ×3 IMPLANT
DERMABOND ADVANCED (GAUZE/BANDAGES/DRESSINGS) ×2
DERMABOND ADVANCED .7 DNX12 (GAUZE/BANDAGES/DRESSINGS) ×1 IMPLANT
DRAPE C-ARM FOLDED MOBILE STRL (DRAPES) ×3 IMPLANT
ELECT REM PT RETURN 9FT ADLT (ELECTROSURGICAL) ×3
ELECTRODE REM PT RTRN 9FT ADLT (ELECTROSURGICAL) ×1 IMPLANT
GLOVE BIO SURGEON STRL SZ 6.5 (GLOVE) ×2 IMPLANT
GLOVE BIO SURGEONS STRL SZ 6.5 (GLOVE) ×1
GLOVE BIOGEL M 7.0 STRL (GLOVE) ×2 IMPLANT
GLOVE BIOGEL PI IND STRL 6.5 (GLOVE) ×1 IMPLANT
GLOVE BIOGEL PI IND STRL 7.0 (GLOVE) ×1 IMPLANT
GLOVE BIOGEL PI IND STRL 7.5 (GLOVE) IMPLANT
GLOVE BIOGEL PI INDICATOR 6.5 (GLOVE) ×4
GLOVE BIOGEL PI INDICATOR 7.0 (GLOVE) ×2
GLOVE BIOGEL PI INDICATOR 7.5 (GLOVE) ×2
GOWN STRL REUS W/TWL LRG LVL3 (GOWN DISPOSABLE) ×6 IMPLANT
IV NS 500ML (IV SOLUTION) ×3
IV NS 500ML BAXH (IV SOLUTION) ×1 IMPLANT
KIT PORT POWER 8FR ISP MRI (Port) ×3 IMPLANT
KIT TURNOVER KIT A (KITS) ×3 IMPLANT
MANIFOLD NEPTUNE II (INSTRUMENTS) ×3 IMPLANT
NDL HYPO 25X1 1.5 SAFETY (NEEDLE) ×1 IMPLANT
NEEDLE HYPO 25X1 1.5 SAFETY (NEEDLE) ×3 IMPLANT
PACK MINOR (CUSTOM PROCEDURE TRAY) ×3 IMPLANT
PAD ARMBOARD 7.5X6 YLW CONV (MISCELLANEOUS) ×3 IMPLANT
SET BASIN LINEN APH (SET/KITS/TRAYS/PACK) ×3 IMPLANT
SUT MNCRL AB 4-0 PS2 18 (SUTURE) ×3 IMPLANT
SUT PROLENE 2 0 SH 30 (SUTURE) ×3 IMPLANT
SUT VIC AB 3-0 SH 27 (SUTURE) ×3
SUT VIC AB 3-0 SH 27X BRD (SUTURE) ×1 IMPLANT
SYR 10ML LL (SYRINGE) ×6 IMPLANT
SYR CONTROL 10ML LL (SYRINGE) ×3 IMPLANT

## 2020-05-18 NOTE — Anesthesia Preprocedure Evaluation (Signed)
Anesthesia Evaluation  Patient identified by MRN, date of birth, ID band Patient awake    Reviewed: Allergy & Precautions, H&P , NPO status , Patient's Chart, lab work & pertinent test results, reviewed documented beta blocker date and time   Airway Mallampati: II  TM Distance: >3 FB Neck ROM: full    Dental no notable dental hx. (+) Edentulous Upper, Edentulous Lower   Pulmonary neg pulmonary ROS,    Pulmonary exam normal breath sounds clear to auscultation       Cardiovascular Exercise Tolerance: Good hypertension, negative cardio ROS   Rhythm:regular Rate:Normal     Neuro/Psych negative neurological ROS  negative psych ROS   GI/Hepatic negative GI ROS, Neg liver ROS,   Endo/Other  negative endocrine ROS  Renal/GU CRFRenal disease  negative genitourinary   Musculoskeletal   Abdominal   Peds  Hematology  (+) Blood dyscrasia, anemia ,   Anesthesia Other Findings   Reproductive/Obstetrics negative OB ROS                             Anesthesia Physical Anesthesia Plan  ASA: II  Anesthesia Plan: General   Post-op Pain Management:    Induction:   PONV Risk Score and Plan: 3 and Propofol infusion  Airway Management Planned:   Additional Equipment:   Intra-op Plan:   Post-operative Plan:   Informed Consent: I have reviewed the patients History and Physical, chart, labs and discussed the procedure including the risks, benefits and alternatives for the proposed anesthesia with the patient or authorized representative who has indicated his/her understanding and acceptance.     Dental Advisory Given  Plan Discussed with: CRNA  Anesthesia Plan Comments:         Anesthesia Quick Evaluation

## 2020-05-18 NOTE — Anesthesia Postprocedure Evaluation (Signed)
Anesthesia Post Note  Patient: Gabrielle [name]  Procedure(s) Performed: INSERTION PORT-A-CATH LEFT SUBCLAVIAN (Left )  Patient location during evaluation: PACU Anesthesia Type: General Level of consciousness: awake, oriented, awake and alert and patient cooperative Pain management: pain level controlled Vital Signs Assessment: post-procedure vital signs reviewed and stable Respiratory status: spontaneous breathing, respiratory function stable and nonlabored ventilation Cardiovascular status: blood pressure returned to baseline and stable Postop Assessment: no headache and no backache Anesthetic complications: no   No complications documented.   Last Vitals:  Vitals:   05/18/20 0636  BP: (!) 169/64  Pulse: 67  Resp: 18  Temp: 36.5 C  SpO2: 98%    Last Pain:  Vitals:   05/18/20 0636  TempSrc: Oral  PainSc: 0-No pain                 Tacy Learn

## 2020-05-18 NOTE — Progress Notes (Signed)
START ON PATHWAY REGIMEN - Colorectal     A cycle is every 14 days:     Oxaliplatin      Leucovorin      Fluorouracil      Fluorouracil   **Always confirm dose/schedule in your pharmacy ordering system**  Patient Characteristics: Postoperative without Neoadjuvant Therapy (Pathologic Staging), Colon, Stage III, High Risk (pT4 or pN2) Tumor Location: Colon Therapeutic Status: Postoperative without Neoadjuvant Therapy (Pathologic Staging) AJCC M Category: cM0 AJCC T Category: pT4a AJCC N Category: pN1a AJCC 8 Stage Grouping: IIIB Intent of Therapy: Curative Intent, Discussed with Patient

## 2020-05-18 NOTE — Transfer of Care (Signed)
Immediate Anesthesia Transfer of Care Note  Patient: Gabrielle [name]  Procedure(s) Performed: INSERTION PORT-A-CATH LEFT SUBCLAVIAN (Left )  Patient Location: PACU  Anesthesia Type:MAC  Level of Consciousness: awake, alert , oriented and patient cooperative  Airway & Oxygen Therapy: Patient Spontanous Breathing and Patient connected to nasal cannula oxygen  Post-op Assessment: Report given to RN, Post -op Vital signs reviewed and stable and Patient moving all extremities  Post vital signs: Reviewed and stable  Last Vitals:  Vitals Value Taken Time  BP    Temp    Pulse 66 05/18/20 0823  Resp    SpO2 97 % 05/18/20 0823  Vitals shown include unvalidated device data.  Last Pain:  Vitals:   05/18/20 0636  TempSrc: Oral  PainSc: 0-No pain         Complications: No complications documented.

## 2020-05-18 NOTE — Discharge Instructions (Signed)
Keep area clean and dry. You can take a shower in 24 hours. Do not submerge in water for 4 weeks.  Take tylenol and ibuprofen for pain control and Norco for severe pain.   Implanted Port Insertion, Care After This sheet gives you information about how to care for yourself after your procedure. Your health care provider may also give you more specific instructions. If you have problems or questions, contact your health care provider. What can I expect after the procedure? After the procedure, it is common to have:  Discomfort at the port insertion site.  Bruising on the skin over the port. This should improve over 3-4 days. Follow these instructions at home: [facility] care  After your port is placed, you will get a manufacturer's information card. The card has information about your port. Keep this card with you at all times.  Take care of the port as told by your health care provider. Ask your health care provider if you or a family member can get training for taking care of the port at home. A home health care nurse may also take care of the port.  Make sure to remember what type of port you have. Incision care      Follow instructions from your health care provider about how to take care of your port insertion site. Make sure you: ? Wash your hands with soap and water before and after you change your bandage (dressing). If soap and water are not available, use hand sanitizer. ? Change your dressing as told by your health care provider. ? Leave stitches (sutures), skin glue, or adhesive strips in place. These skin closures may need to stay in place for 2 weeks or longer. If adhesive strip edges start to loosen and curl up, you may trim the loose edges. Do not remove adhesive strips completely unless your health care provider tells you to do that.  Check your port insertion site every day for signs of infection. Check for: ? Redness, swelling, or pain. ? Fluid or blood. ? Warmth. ? Pus  or a bad smell. Activity  Return to your normal activities as told by your health care provider. Ask your health care provider what activities are safe for you.  Do not lift anything that is heavier than 10 lb (4.5 kg), or the limit that you are told, until your health care provider says that it is safe. General instructions  Take over-the-counter and prescription medicines only as told by your health care provider.  Do not take baths, swim, or use a hot tub until your health care provider approves.   You may shower.  Do not drive for 24 hours if you were given a sedative during your procedure.  Wear a medical alert bracelet in case of an emergency. This will tell any health care providers that you have a port.  Keep all follow-up visits as told by your health care provider. This is important. Contact a health care provider if:  You cannot flush your port with saline as directed, or you cannot draw blood from the port.  You have a fever or chills.  You have redness, swelling, or pain around your port insertion site.  You have fluid or blood coming from your port insertion site.  Your port insertion site feels warm to the touch.  You have pus or a bad smell coming from the port insertion site. Get help right away if:  You have chest pain or shortness of breath.  You have bleeding from your port that you cannot control. Summary  Take care of the port as told by your health care provider. Keep the manufacturer's information card with you at all times.  Change your dressing as told by your health care provider.  Contact a health care provider if you have a fever or chills or if you have redness, swelling, or pain around your port insertion site.  Keep all follow-up visits as told by your health care provider. This information is not intended to replace advice given to you by your health care provider. Make sure you discuss any questions you have with your health care  provider. Document Revised: 06/19/2018 Document Reviewed: 06/19/2018 Elsevier Patient Education  [place] After These instructions provide you with information about caring for yourself after your procedure. Your health care provider may also give you more specific instructions. Your treatment has been planned according to current medical practices, but problems sometimes occur. Call your health care provider if you have any problems or questions after your procedure. What can I expect after the procedure? After your procedure, you may:  Feel sleepy for several hours.  Feel clumsy and have poor balance for several hours.  Feel forgetful about what happened after the procedure.  Have poor judgment for several hours.  Feel nauseous or vomit.  Have a sore throat if you had a breathing tube during the procedure. Follow these instructions at home: For at least 24 hours after the procedure:      Have a responsible adult stay with you. It is important to have someone help care for you until you are awake and alert.  Rest as needed.  Do not: ? Participate in activities in which you could fall or become injured. ? Drive. ? Use heavy machinery. ? Drink alcohol. ? Take sleeping pills or medicines that cause drowsiness. ? Make important decisions or sign legal documents. ? Take care of children on your own. Eating and drinking  Follow the diet that is recommended by your health care provider.  If you vomit, drink water, juice, or soup when you can drink without vomiting.  Make sure you have little or no nausea before eating solid foods. General instructions  Take over-the-counter and prescription medicines only as told by your health care provider.  If you have sleep apnea, surgery and certain medicines can increase your risk for breathing problems. Follow instructions from your health care provider about wearing your sleep  device: ? Anytime you are sleeping, including during daytime naps. ? While taking prescription pain medicines, sleeping medicines, or medicines that make you drowsy.  If you smoke, do not smoke without supervision.  Keep all follow-up visits as told by your health care provider. This is important. Contact a health care provider if:  You keep feeling nauseous or you keep vomiting.  You feel light-headed.  You develop a rash.  You have a fever. Get help right away if:  You have trouble breathing. Summary  For several hours after your procedure, you may feel sleepy and have poor judgment.  Have a responsible adult stay with you for at least 24 hours or until you are awake and alert. This information is not intended to replace advice given to you by your health care provider. Make sure you discuss any questions you have with your health care provider. Document Revised: 02/19/2018 Document Reviewed: 03/13/2016 Elsevier Patient Education  [place].

## 2020-05-18 NOTE — Progress Notes (Signed)
[facility]   CXR good and lateral good without signs of the port being in the azygos vein. Ok to go home.  Curlene Labrum, MD [facility] [address] [place], [place] [address] [phone] (office)

## 2020-05-18 NOTE — Op Note (Signed)
Operative Note 05/18/20   Preoperative Diagnosis:  Colon cancer    Postoperative Diagnosis: Same   Procedure(s) Performed: Port-A-Cath placement, left subclavian   Surgeon: Lanell Matar. Constance Haw, MD   Assistants: No qualified resident was available   Anesthesia: Monitored anesthesia care   Anesthesiologist: Dr. Briant Cedar    Specimens: None   Estimated Blood Loss: Minimal   Fluoroscopy time: 38 seconds   Blood Replacement: None    Complications: None    Operative Findings: Normal anatomy  Indications:  [name] is a 74 yo with colon cancer who will need to undergo chemotherapy. We discussed the option of port placement and the risk of bleeding, infection, malfunction, and pneumothorax.   Procedure: The patient was brought into the operating room and monitored anesthesia care was induced.  One percent lidocaine was used for local anesthesia.   The left chest and neck was prepped and draped in the usual sterile fashion.  Preoperative antibiotics  given.  An incision was made below the left clavicle. A subcutaneous pocket was formed. The needles advanced into the left subclavian vein using the Seldinger technique without difficulty. A guidewire was then advanced into the right atrium under fluoroscopic guidance.  Ectopia was noted and the wire was retracted back some. An introducer and peel-away sheath were placed over the guidewire. The catheter was then inserted through the peel-away sheath and the peel-away sheath was removed.  A spot film was performed to confirm the position. The catheter was then attached to the port and the port placed in subcutaneous pocket. Adequate positioning was confirmed by fluoroscopy. Hemostasis was confirmed, and the port was secured with 2-0 prolene sutures.  Good backflow of blood was noted on aspiration of the port. The port was flushed with heparin flush. Subcutaneous layer was reapproximated using a 3-0 Vicryl interrupted suture. The skin was closed using a  4-0 Vicryl subcuticular suture. Dermabond was applied.    All tape and needle counts were correct at the end of the procedure. The patient was transferred to PACU in stable condition. A chest x-ray will be performed at that time.  Curlene Labrum, MD [facility] [address] [place], [place] [address] [phone] (office)

## 2020-05-18 NOTE — Progress Notes (Signed)
[facility]  Updated daughter. CXR looks good. Official read pending. Norco if needed sent to pharmacy for pain. PRN follow up with me.   Curlene Labrum, MD [facility] [address] [place], [place] [address] [phone] (office)

## 2020-05-18 NOTE — Interval H&P Note (Signed)
History and Physical Interval Note:  05/18/2020 7:19 AM  Gabrielle [name]  has presented today for surgery, with the diagnosis of Colon Cancer.  The various methods of treatment have been discussed with the patient and family. After consideration of risks, benefits and other options for treatment, the patient has consented to  Procedure(s): INSERTION PORT-A-CATH (N/A) as a surgical intervention.  The patient's history has been reviewed, patient examined, no change in status, stable for surgery.  I have reviewed the patient's chart and labs.  Questions were answered to the patient's satisfaction.    No questions. Plan for port on the left.  Virl Cagey

## 2020-05-19 ENCOUNTER — Encounter (HOSPITAL_COMMUNITY): Payer: Self-pay | Admitting: General Surgery

## 2020-05-19 ENCOUNTER — Inpatient Hospital Stay (HOSPITAL_BASED_OUTPATIENT_CLINIC_OR_DEPARTMENT_OTHER): Payer: Medicare HMO | Admitting: Hematology

## 2020-05-19 ENCOUNTER — Other Ambulatory Visit: Payer: Self-pay

## 2020-05-19 ENCOUNTER — Inpatient Hospital Stay (HOSPITAL_COMMUNITY): Payer: Medicare HMO

## 2020-05-19 ENCOUNTER — Encounter (HOSPITAL_COMMUNITY): Payer: Self-pay | Admitting: Hematology

## 2020-05-19 VITALS — BP 148/63 | HR 72 | Temp 97.2°F | Resp 18 | Wt 136.3 lb

## 2020-05-19 VITALS — BP 175/66 | HR 64 | Temp 97.2°F | Resp 18

## 2020-05-19 DIAGNOSIS — C182 Malignant neoplasm of ascending colon: Secondary | ICD-10-CM

## 2020-05-19 DIAGNOSIS — D509 Iron deficiency anemia, unspecified: Secondary | ICD-10-CM | POA: Diagnosis not present

## 2020-05-19 DIAGNOSIS — N189 Chronic kidney disease, unspecified: Secondary | ICD-10-CM | POA: Diagnosis not present

## 2020-05-19 DIAGNOSIS — Z452 Encounter for adjustment and management of vascular access device: Secondary | ICD-10-CM | POA: Diagnosis not present

## 2020-05-19 DIAGNOSIS — D500 Iron deficiency anemia secondary to blood loss (chronic): Secondary | ICD-10-CM

## 2020-05-19 DIAGNOSIS — D631 Anemia in chronic kidney disease: Secondary | ICD-10-CM | POA: Diagnosis not present

## 2020-05-19 DIAGNOSIS — Z5111 Encounter for antineoplastic chemotherapy: Secondary | ICD-10-CM | POA: Diagnosis not present

## 2020-05-19 LAB — COMPREHENSIVE METABOLIC PANEL
ALT: 7 U/L (ref 0–44)
AST: 17 U/L (ref 15–41)
Albumin: 3.4 g/dL — ABNORMAL LOW (ref 3.5–5.0)
Alkaline Phosphatase: 65 U/L (ref 38–126)
Anion gap: 9 (ref 5–15)
BUN: 25 mg/dL — ABNORMAL HIGH (ref 8–23)
CO2: 29 mmol/L (ref 22–32)
Calcium: 9.1 mg/dL (ref 8.9–10.3)
Chloride: 100 mmol/L (ref 98–111)
Creatinine, Ser: 1.12 mg/dL — ABNORMAL HIGH (ref 0.44–1.00)
GFR calc Af Amer: 56 mL/min — ABNORMAL LOW (ref >60)
GFR calc non Af Amer: 49 mL/min — ABNORMAL LOW (ref >60)
Glucose, Bld: 125 mg/dL — ABNORMAL HIGH (ref 70–99)
Potassium: 3.4 mmol/L — ABNORMAL LOW (ref 3.5–5.1)
Sodium: 138 mmol/L (ref 135–145)
Total Bilirubin: 0.3 mg/dL (ref 0.3–1.2)
Total Protein: 7 g/dL (ref 6.5–8.1)

## 2020-05-19 LAB — CBC WITH DIFFERENTIAL/PLATELET
Abs Immature Granulocytes: 0.01 10*3/uL (ref 0.00–0.07)
Basophils Absolute: 0 10*3/uL (ref 0.0–0.1)
Basophils Relative: 1 %
Eosinophils Absolute: 0.2 10*3/uL (ref 0.0–0.5)
Eosinophils Relative: 3 %
HCT: 30.2 % — ABNORMAL LOW (ref 36.0–46.0)
Hemoglobin: 9.2 g/dL — ABNORMAL LOW (ref 12.0–15.0)
Immature Granulocytes: 0 %
Lymphocytes Relative: 18 %
Lymphs Abs: 1 10*3/uL (ref 0.7–4.0)
MCH: 28.8 pg (ref 26.0–34.0)
MCHC: 30.5 g/dL (ref 30.0–36.0)
MCV: 94.4 fL (ref 80.0–100.0)
Monocytes Absolute: 0.6 10*3/uL (ref 0.1–1.0)
Monocytes Relative: 10 %
Neutro Abs: 4 10*3/uL (ref 1.7–7.7)
Neutrophils Relative %: 68 %
Platelets: 170 10*3/uL (ref 150–400)
RBC: 3.2 MIL/uL — ABNORMAL LOW (ref 3.87–5.11)
RDW: 20.3 % — ABNORMAL HIGH (ref 11.5–15.5)
WBC: 5.8 10*3/uL (ref 4.0–10.5)
nRBC: 0 % (ref 0.0–0.2)

## 2020-05-19 LAB — MAGNESIUM: Magnesium: 1.9 mg/dL (ref 1.7–2.4)

## 2020-05-19 MED ORDER — FLUOROURACIL CHEMO INJECTION 500 MG/10ML
320.0000 mg/m2 | Freq: Once | INTRAVENOUS | Status: AC
  Administered 2020-05-19: 500 mg via INTRAVENOUS
  Filled 2020-05-19: qty 10

## 2020-05-19 MED ORDER — SODIUM CHLORIDE 0.9 % IV SOLN
10.0000 mg | Freq: Once | INTRAVENOUS | Status: AC
  Administered 2020-05-19: 10 mg via INTRAVENOUS
  Filled 2020-05-19: qty 10

## 2020-05-19 MED ORDER — DEXTROSE 5 % IV SOLN: Freq: Once | INTRAVENOUS | Status: AC

## 2020-05-19 MED ORDER — OXALIPLATIN CHEMO INJECTION 100 MG/20ML
68.0000 mg/m2 | Freq: Once | INTRAVENOUS | Status: AC
  Administered 2020-05-19: 110 mg via INTRAVENOUS
  Filled 2020-05-19: qty 20

## 2020-05-19 MED ORDER — SODIUM CHLORIDE 0.9 % IV SOLN
510.0000 mg | Freq: Once | INTRAVENOUS | Status: AC
  Administered 2020-05-19: 510 mg via INTRAVENOUS
  Filled 2020-05-19: qty 17

## 2020-05-19 MED ORDER — PALONOSETRON HCL INJECTION 0.25 MG/5ML
0.2500 mg | Freq: Once | INTRAVENOUS | Status: AC
  Administered 2020-05-19: 0.25 mg via INTRAVENOUS
  Filled 2020-05-19: qty 5

## 2020-05-19 MED ORDER — LEUCOVORIN CALCIUM INJECTION 350 MG
320.0000 mg/m2 | Freq: Once | INTRAVENOUS | Status: AC
  Administered 2020-05-19: 522 mg via INTRAVENOUS
  Filled 2020-05-19: qty 26.1

## 2020-05-19 MED ORDER — ACETAMINOPHEN 325 MG PO TABS
650.0000 mg | ORAL_TABLET | Freq: Once | ORAL | Status: AC
  Administered 2020-05-19: 650 mg via ORAL
  Filled 2020-05-19: qty 2

## 2020-05-19 MED ORDER — SODIUM CHLORIDE 0.9 % IV SOLN: Freq: Once | INTRAVENOUS | Status: AC

## 2020-05-19 MED ORDER — FAMOTIDINE 20 MG PO TABS
20.0000 mg | ORAL_TABLET | Freq: Once | ORAL | Status: AC
  Administered 2020-05-19: 20 mg via ORAL
  Filled 2020-05-19: qty 1

## 2020-05-19 MED ORDER — SODIUM CHLORIDE 0.9 % IV SOLN
1920.0000 mg/m2 | INTRAVENOUS | Status: DC
  Administered 2020-05-19: 3150 mg via INTRAVENOUS
  Filled 2020-05-19: qty 63

## 2020-05-19 MED ORDER — LORATADINE 10 MG PO TABS
10.0000 mg | ORAL_TABLET | Freq: Once | ORAL | Status: AC
  Administered 2020-05-19: 10 mg via ORAL
  Filled 2020-05-19: qty 1

## 2020-05-19 NOTE — Patient Instructions (Signed)
[facility] Discharge Instructions  Labs drawn from portacath today   Thank you for choosing [facility] to provide your oncology and hematology care.  To afford each patient quality time with our provider, please arrive at least 15 minutes before your scheduled appointment time.   If you have a lab appointment with the Cancer Center please come in thru the Main Entrance and check in at the main information desk.  You need to re-schedule your appointment should you arrive 10 or more minutes late.  We strive to give you quality time with our providers, and arriving late affects you and other patients whose appointments are after yours.  Also, if you no show three or more times for appointments you may be dismissed from the clinic at the providers discretion.     Again, thank you for choosing [facility].  Our hope is that these requests will decrease the amount of time that you wait before being seen by our physicians.       _____________________________________________________________  Should you have questions after your visit to [facility], please contact our office at [phone] between the hours of 8:00 a.m. and 4:30 p.m.  Voicemails left after 4:00 p.m. will not be returned until the following business day.  For prescription refill requests, have your pharmacy contact our office and allow 72 hours.    Due to Covid, you will need to wear a mask upon entering the hospital. If you do not have a mask, a mask will be given to you at the Main Entrance upon arrival. For doctor visits, patients may have 1 support person with them. For treatment visits, patients can not have anyone with them due to social distancing guidelines and our immunocompromised population.     

## 2020-05-19 NOTE — Patient Instructions (Signed)

## 2020-05-19 NOTE — Progress Notes (Signed)
Gabrielle [name], Gabrielle [name] [id number]   CLINIC:  Medical Oncology/Hematology  PCP:  Janora Norlander, DO [address] [place] [address] [phone]   REASON FOR VISIT:  Follow-up for right colon cancer  PRIOR THERAPY: Right hemicolectomy on 04/08/2020  NGS Results: Not done  CURRENT THERAPY: FOLFOX  BRIEF ONCOLOGIC HISTORY:  Oncology History  Malignant neoplasm of ascending colon ([place])  04/07/2020 Initial Diagnosis   Malignant neoplasm of ascending colon ([place])   05/19/2020 -  Chemotherapy   The patient had PALONOSETRON HCL INJECTION 0.25 MG/5ML, 0.25 mg, Intravenous,  Once, 0 of 12 cycles leucovorin 652 mg in dextrose 5 % 250 mL infusion, 400 mg/m2, Intravenous,  Once, 0 of 12 cycles oxaliplatin (ELOXATIN) 140 mg in dextrose 5 % 500 mL chemo infusion, 85 mg/m2, Intravenous,  Once, 0 of 12 cycles fluorouracil (ADRUCIL) chemo injection 650 mg, 400 mg/m2, Intravenous,  Once, 0 of 12 cycles fluorouracil (ADRUCIL) 3,900 mg in sodium chloride 0.9 % 72 mL chemo infusion, 2,400 mg/m2, Intravenous, 1 Day/Dose, 0 of 12 cycles  for chemotherapy treatment.      CANCER STAGING: Cancer Staging Malignant neoplasm of ascending colon [facility]) Staging form: Colon and Rectum, AJCC 8th Edition - Clinical stage from 04/27/2020: Stage IIIB (cT4a, cN1a, cM0) - Unsigned   INTERVAL HISTORY:  Ms. Gabrielle DIGIOIA, a 74 y.o. female, returns for routine follow-up and consideration for inital cycle of chemotherapy. [name] was last seen on 05/13/2020.  Due for cycle #1 of FOLFOX today.   Overall, she tells me she has been feeling pretty well and reports no issues. She is still ambulating. She denies abdominal pain or N/V. She has daily BMs with soft stool without the use of stool softeners. She denies having any numbness or tingling.  Overall, she feels ready for next cycle of chemo today.    REVIEW OF SYSTEMS:  Review of Systems  Constitutional: Positive for  appetite change (mildly decreased) and fatigue (mild).  Gastrointestinal: Negative for abdominal pain, nausea and vomiting.  All other systems reviewed and are negative.   PAST MEDICAL/SURGICAL HISTORY:  Past Medical History:  Diagnosis Date  . Arthritis   . Cataract    OU  . H/O cesarean section   . Hx of tonsillectomy   . Hyperlipidemia   . Hypertension   . Macular degeneration    Exu ARMD OU   Past Surgical History:  Procedure Laterality Date  . BIOPSY  04/06/2020   Procedure: BIOPSY;  Surgeon: Rogene Houston, MD;  Location: [facility] ENDO SU[facility];  Service: Endoscopy;;  . CARPAL TUNNEL RELEASE Right   . CATARACT EXTRACTION W/PHACO Left 10/11/2019   Procedure: CATARACT EXTRACTION PHACO AND INTRAOCULAR LENS PLACEMENT (IOC);  Surgeon: Baruch Goldmann, MD;  Location: [facility] ORS;  Service: Ophthalmology;  Laterality: Left;  CDE: 8.56  . CATARACT EXTRACTION W/PHACO Right 10/25/2019   Procedure: CATARACT EXTRACTION PHACO AND INTRAOCULAR LENS PLACEMENT (IOC);  Surgeon: Baruch Goldmann, MD;  Location: [facility] ORS;  Service: Ophthalmology;  Laterality: Right;  CDE: 5.67  . CESAREAN SECTION    . COLONOSCOPY N/A 04/06/2020   Procedure: COLONOSCOPY;  Surgeon: Rogene Houston, MD;  Location: [facility] ENDO SUITE;  Service: Endoscopy;  Laterality: N/A;  . CYSTOSCOPY WITH BIOPSY N/A 04/08/2020   Procedure: CYSTOSCOPY WITH BIOPSY;  Surgeon: Cleon Gustin, MD;  Location: [facility] ORS;  Service: Urology;  Laterality: N/A;  . ESOPHAGOGASTRODUODENOSCOPY N/A 04/05/2020   Procedure: ESOPHAGOGASTRODUODENOSCOPY (EGD);  Surgeon: Laural Golden,  Mechele Dawley, MD;  Location: [facility] SU[facility];  Service: Endoscopy;  Laterality: N/A;  . PARTIAL COLECTOMY N/A 04/08/2020   Procedure: PARTIAL COLECTOMY;  Surgeon: Virl Cagey, MD;  Location: [facility] ORS;  Service: General;  Laterality: N/A;  . TONSILLECTOMY      SOCIAL HISTORY:  Social History   Socioeconomic History  . Marital status: Divorced    Spouse name: Not on file  . Number of children: Not  on file  . Years of education: Not on file  . Highest education level: Not on file  Occupational History  . Not on file  Tobacco Use  . Smoking status: Never Smoker  . Smokeless tobacco: Never Used  Vaping Use  . Vaping Use: Never used  Substance and Sexual Activity  . Alcohol use: No  . Drug use: No  . Sexual activity: Not Currently  Other Topics Concern  . Not on file  Social History Narrative  . Not on file   Social Determinants of Health   Financial Resource Strain:   . Difficulty of Paying Living Expenses:   Food Insecurity:   . Worried About Charity fundraiser in the Last Year:   . Arboriculturist in the Last Year:   Transportation Needs:   . Film/video editor (Medical):   Marland Kitchen Lack of Transportation (Non-Medical):   Physical Activity:   . Days of Exercise per Week:   . Minutes of Exercise per Session:   Stress:   . Feeling of Stress :   Social Connections:   . Frequency of Communication with Friends and Family:   . Frequency of Social Gatherings with Friends and Family:   . Attends Religious Services:   . Active Member of Clubs or Organizations:   . Attends Archivist Meetings:   Marland Kitchen Marital Status:   Intimate Partner Violence:   . Fear of Current or Ex-Partner:   . Emotionally Abused:   Marland Kitchen Physically Abused:   . Sexually Abused:     FAMILY HISTORY:  Family History  Problem Relation Age of Onset  . Macular degeneration Mother   . Stroke Mother   . Hypertension Mother   . Dementia Mother   . Cancer Father        lung  . Diabetes Sister   . Hypertension Sister     CURRENT MEDICATIONS:  Current Outpatient Medications  Medication Sig Dispense Refill  . acetaminophen (TYLENOL) 500 MG tablet Take 1,000 mg by mouth every 6 (six) hours as needed for moderate pain.     Marland Kitchen alendronate (FOSAMAX) 70 MG tablet Take 1 tablet (70 mg total) by mouth every 7 (seven) days. Take with a full glass of water on an empty stomach. 4 tablet 11  . atorvastatin  (LIPITOR) 20 MG tablet Take 1 tablet (20 mg total) by mouth daily. 90 tablet 3  . cholecalciferol (VITAMIN D3) 25 MCG (1000 UT) tablet Take 1,000 Units by mouth daily.    . diclofenac Sodium (VOLTAREN) 1 % GEL Apply 4 g topically 4 (four) times daily. 400 g 2  . dorzolamide-timolol (COSOPT) 22.3-6.8 MG/ML ophthalmic solution Place 1 drop into both eyes daily. 10 mL 2  . ferrous sulfate 325 (65 FE) MG EC tablet Take 1 tablet (325 mg total) by mouth 2 (two) times daily with a meal. 180 tablet 3  . fluorouracil CALGB 86381 in sodium chloride 0.9 % 150 mL Inject into the vein over 48 hr.    . FLUOROURACIL IV  Inject into the vein every 14 (fourteen) days.    Marland Kitchen HYDROcodone-acetaminophen (NORCO/VICODIN) 5-325 MG tablet Take 1 tablet by mouth every 4 (four) hours as needed for severe pain. 5 tablet 0  . LEUCOVORIN CALCIUM IV Inject into the vein every 14 (fourteen) days.    Marland Kitchen lidocaine-prilocaine (EMLA) cream Apply a small amount to port a cath site and cover with plastic wrap 1 hour prior to chemotherapy appointments 30 g 0  . losartan-hydrochlorothiazide (HYZAAR) 100-25 MG tablet TAKE 1 TABLET DAILY.  Needs to be seen for further refills. (Patient taking differently: Take 1 tablet by mouth daily. ) 90 tablet 3  . OXALIPLATIN IV Inject into the vein every 14 (fourteen) days.    . potassium chloride SA (KLOR-CON) 20 MEQ tablet Take 1 tablet (20 mEq total) by mouth daily. 90 tablet 1  . prochlorperazine (COMPAZINE) 10 MG tablet Take 1 tablet (10 mg total) by mouth every 6 (six) hours as needed for nausea or vomiting. 30 tablet 2   No current facility-administered medications for this visit.    ALLERGIES:  Allergies  Allergen Reactions  . Lisinopril Cough    PHYSICAL EXAM:  Performance status (ECOG): 1 - Symptomatic but completely ambulatory  Vitals:   05/19/20 0753  BP: (!) 148/63  Pulse: 72  Resp: 18  Temp: (!) 97.2 F (36.2 C)  SpO2: 100%   Wt Readings from Last 3 Encounters:  05/19/20  136 lb 4.8 oz (61.8 kg)  05/18/20 134 lb 0.6 oz (60.8 kg)  05/14/20 134 lb (60.8 kg)   Physical Exam Vitals reviewed.  Constitutional:      Appearance: Normal appearance.  Cardiovascular:     Rate and Rhythm: Normal rate and regular rhythm.     Heart sounds: Normal heart sounds.  Pulmonary:     Effort: Pulmonary effort is normal.     Breath sounds: Normal breath sounds.  Abdominal:     General: There is no distension.     Palpations: Abdomen is soft. There is no mass.  Neurological:     General: No focal deficit present.     Mental Status: She is alert and oriented to person, place, and time.  Psychiatric:        Mood and Affect: Mood normal.        Behavior: Behavior normal.     LABORATORY DATA:  I have reviewed the labs as listed.  CBC Latest Ref Rng & Units 05/13/2020 04/20/2020 04/12/2020  WBC 4.0 - 10.5 K/uL 8.3 6.1 5.3  Hemoglobin 12.0 - 15.0 g/dL 9.8(L) 7.9(LL) 7.5(L)  Hematocrit 36 - 46 % 32.0(L) 25.4(L) 25.4(L)  Platelets 150 - 400 K/uL 186 247 213   CMP Latest Ref Rng & Units 05/14/2020 04/20/2020 04/12/2020  Glucose 70 - 99 mg/dL 114(H) 89 92  BUN 8 - 23 mg/dL 26(H) 15 11  Creatinine 0.44 - 1.00 mg/dL 1.28(H) 1.43(H) 0.93  Sodium 135 - 145 mmol/L 138 139 138  Potassium 3.5 - 5.1 mmol/L 3.1(L) 3.1(L) 4.3  Chloride 98 - 111 mmol/L 98 96 108  CO2 22 - 32 mmol/L 29 30(H) 24  Calcium 8.9 - 10.3 mg/dL 9.6 8.7 7.8(L)  Total Protein 6.0 - 8.5 g/dL - 6.0 -  Total Bilirubin 0.0 - 1.2 mg/dL - <0.2 -  Alkaline Phos 48 - 121 IU/L - 86 -  AST 0 - 40 IU/L - 10 -  ALT 0 - 32 IU/L - 5 -    DIAGNOSTIC IMAGING:  I have independently reviewed  the scans and discussed with the patient. DG Chest 1 View  Result Date: 05/18/2020 CLINICAL DATA:  Port-A-Cath placement.  Colon carcinoma. EXAM: CHEST  1 VIEW COMPARISON:  PET-CT May 12, 2020 FINDINGS: Port-A-Cath tip is in the superior vena cava. No pneumothorax. There is mild bibasilar atelectasis. Lungs otherwise are clear. Heart size and  pulmonary vascularity are normal. No adenopathy. There is degenerative change in the thoracic spine. There is aortic atherosclerosis. IMPRESSION: Port-A-Cath tip in superior vena cava. No pneumothorax. Slight bibasilar atelectasis. Lungs otherwise clear. Heart size normal. Aortic Atherosclerosis (ICD10-I70.0). Electronically Signed   By: Lowella Grip III M.D.   On: 05/18/2020 09:26   NM PET Image Initial (PI) Skull Base To Thigh  Result Date: 05/12/2020 CLINICAL DATA:  Initial treatment strategy for colon cancer. EXAM: NUCLEAR MEDICINE PET SKULL BASE TO THIGH TECHNIQUE: 7.27 mCi F-18 FDG was injected intravenously. Full-ring PET imaging was performed from the skull base to thigh after the radiotracer. CT data was obtained and used for attenuation correction and anatomic localization. Fasting blood glucose: 85 mg/dl COMPARISON:  04/06/2020 FINDINGS: Mediastinal blood pool activity: SUV max 2.81 Liver activity: SUV max NA NECK: No hypermetabolic lymph nodes in the neck. Incidental CT findings: none CHEST: No hypermetabolic mediastinal or hilar nodes. 4 mm LEFT lower lobe pulmonary nodule 4 mm (image 101, series 3) area not imaged on previous assessment. Area not associated with increased metabolic activity there with limited evaluation given the size. Additional nodule at the LEFT lung base measuring approximately 6 mm (image 108, series 3) and a third nodule at the RIGHT lower lobe approximately 6 mm uptake below blood pool, remaining nonspecific. Incidental CT findings: Calcified coronary artery disease. Mild cardiac enlargement. Mitral annular calcification. Extensive calcified atheromatous plaque of the thoracic aorta. Limited assessment of heart and great vessels in the chest without contrast. No signs of consolidation or evidence of pleural effusion in the chest. ABDOMEN/PELVIS: Focus of increased FDG uptake along the anastomotic suture line of the ileocolonic anastomosis following RIGHT hemicolectomy.  Mild thickening of the colon in this location (image 162, series 3) 9 mm. (SUVmax = 5.9 mild increased FDG uptake along the midline of the abdomen associated with postoperative changes. No focal, suspicious uptake in the liver or within abdominal lymph nodes. Diffuse bladder wall thickening associated with moderate FDG uptake. Some of the uptake within the bladder is likely excreted FDG in the urine in.) Incidental CT findings: Small cysts in the RIGHT kidney. No hydronephrosis. No nephrolithiasis. Post RIGHT hemicolectomy as described. Mild peri-anastomotic stranding without focal fluid or sign of free air. Thickening along the posterior margin of the anastomotic site. See above. SKELETON: No focal hypermetabolic activity to suggest skeletal metastasis. Focus of increased FDG uptake associated with supraspinatus muscular or tendinous activity about the LEFT shoulder no focal abnormality on CT images. Incidental CT findings: none IMPRESSION: 1. Post RIGHT hemicolectomy with mild FDG uptake associated with the anastomotic site. This may represent mild edema or inflammation, attention on follow-up. 2. Small pulmonary nodules that do not show elevated FDG activity, remain nonspecific. Suggest dedicated chest CT for further assessment. Metastatic disease is considered but there are no priors for comparison. Comparison with prior imaging could also be beneficial if available. 3. Increased FDG uptake associated with supraspinatus about the LEFT shoulder likely related to tendinopathy given lack of CT correlate. Correlate with symptoms. This could potentially relate as well to the recent COVID vaccination in the LEFT arm, attention on follow-up. 4. Persistent but improved  bladder wall thickening. No debris in the urinary bladder lumen with mild diffuse uptake remains suggestive of cystitis. Electronically Signed   By: Zetta Bills M.D.   On: 05/12/2020 12:34   DG Chest Port 1 View  Result Date: 05/18/2020 CLINICAL  DATA:  Left Port-A-Cath placement. EXAM: PORTABLE CHEST 1 VIEW COMPARISON:  PET 05/12/2020. FINDINGS: Patient is slightly rotated. Trachea is midline. Heart is enlarged. Thoracic aorta is calcified. Left-sided power port tip projects in the region of the SVC. Difficult to exclude posterior course, within the azygos vein. Lungs are somewhat low in volume with linear atelectasis or scarring at the lung bases. No pleural fluid. No pneumothorax. IMPRESSION: 1. Left-sided power port placement with tip in the region of the SVC. Difficult to exclude posterior course, within the azygos vein. Lateral view would be helpful. No pneumothorax. 2. Minimal bibasilar subsegmental atelectasis/scarring. 3.  Aortic atherosclerosis (ICD10-I70.0). Electronically Signed   By: Lorin Picket M.D.   On: 05/18/2020 08:48   DG C-Arm 1-60 Min-No Report  Result Date: 05/18/2020 Fluoroscopy was utilized by the requesting physician.  No radiographic interpretation.   Intravitreal Injection, Pharmacologic Agent - OD - Right Eye  Result Date: 04/30/2020 Time Out 04/30/2020. 8:26 AM. Confirmed correct patient, procedure, site, and patient consented. Anesthesia Topical anesthesia was used. Anesthetic medications included Lidocaine 2%, Proparacaine 0.5%. Procedure Preparation included 5% betadine to ocular surface, eyelid speculum. A (33g) needle was used. Injection: 2 mg aflibercept Alfonse Flavors) SOLN   NDC: A3590391, Lot: [id number], Expiration date: 08/31/2020   Route: Intravitreal, Site: Right Eye, Waste: 0.05 mL Post-op Post injection exam found visual acuity of at least counting fingers. The patient tolerated the procedure well. There were no complications. The patient received written and verbal post procedure care education.   Intravitreal Injection, Pharmacologic Agent - OS - Left Eye  Result Date: 04/30/2020 Time Out 04/30/2020. 8:27 AM. Confirmed correct patient, procedure, site, and patient consented. Anesthesia Topical anesthesia  was used. Anesthetic medications included Lidocaine 2%, Proparacaine 0.5%. Procedure Preparation included 5% betadine to ocular surface, eyelid speculum. A (33g) needle was used. Injection: 2 mg aflibercept Alfonse Flavors) SOLN   NDC: A3590391, Lot: [id number], Expiration date: 08/31/2020   Route: Intravitreal, Site: Left Eye, Waste: 0.05 mL Post-op Post injection exam found visual acuity of at least counting fingers. The patient tolerated the procedure well. There were no complications. The patient received written and verbal post procedure care education.   OCT, Retina - OU - Both Eyes  Result Date: 04/30/2020 Right Eye Quality was good. Central Foveal Thickness: 465. Progression has been stable. Findings include pigment epithelial detachment, epiretinal membrane, subretinal hyper-reflective material, retinal drusen , abnormal foveal contour, no SRF, intraretinal fluid, disciform scar, outer retinal tubulation, outer retinal atrophy (Stable sub-retinal scar, mild interval improvement in IRF). Left Eye Quality was good. Central Foveal Thickness: 403. Progression has been stable. Findings include subretinal hyper-reflective material, pigment epithelial detachment, intraretinal fluid, retinal drusen , abnormal foveal contour, no SRF, outer retinal atrophy (persistent IRF overlying SRHM/PED). Notes *Images captured and stored on drive Diagnosis / Impression: Exudative ARMD OU OD: Stable sub-retinal scar, minimal cystic changes overlying -- stable OS: tr persistent IRF overlying SRHM/PED Clinical management: See below Abbreviations: NFP - Normal foveal profile. CME - cystoid macular edema. PED - pigment epithelial detachment. IRF - intraretinal fluid. SRF - subretinal fluid. EZ - ellipsoid zone. ERM - epiretinal membrane. ORA - outer retinal atrophy. ORT - outer retinal tubulation. SRHM - subretinal hyper-reflective material     ASSESSMENT:  1. Stage IIIb (T4AN1A) poorly differentiated right colon  adenocarcinoma: -Right hemicolectomy on 04/07/2020 with poorly differentiated adenocarcinoma, pT4a, positive radial margin, 1/15 lymph nodes positive, loss of MLH1 and PMS2, bladder biopsy negative for malignancy. -CTAP on 04/06/2020 showed circumferential ascending colon mass with numerous borderline enlarged pericolonic lymph nodes.  No findings of hepatic metastatic disease. -CEA on 04/04/2020 was 12.2.  CEA improved to 1.7 on 05/13/2020. -PET scan on 05/11/2020 shows mild FDG uptake associated with the anastomotic site.  Small pulmonary nodules that do not show elevated FDG activity, remain nonspecific, subcentimeter.  Persistent but improved bladder wall thickening.  2.  Family history: -Paternal uncle had colon cancer.  Maternal aunt had brain cancer and another maternal aunt had gynecological malignancy.  Father had lung cancer and was a smoker.  3.  Diffuse erythema and nodularity of the dome of the bladder: -CT scan showed severely thickened bladder wall. -Cystoscopy on 04/08/2020 showed diffuse erythema, nodularity in the posterior wall tracking to the dome.  Ureteral orifices were in normal locations.  Biopsies were benign.   PLAN:  1. Stage IIIb (T4AN1A) poorly differentiated right colon adenocarcinoma: -We have ordered MLH1 methylation testing and BRAF mutation testing. -She had port placed yesterday.  Port site is within normal limits.  I have reviewed her labs.  LFTs are normal.  White count and platelets are normal. -She will proceed with first cycle of FOLFOX.  Will start at 20% dose reduction.  I talked about the side effects in detail again.  We will see her back in 1 week to see how she is tolerating it.  Next cycle will be in 2 weeks.  2. Normocytic anemia: -This is from combination of CKD and iron deficiency. -We discussed about the side effects of Feraheme.  She will proceed with first infusion today.  3. Family history: -She was referred for genetic testing.  4.  Diffuse erythema nodularity of the dome of the bladder: -No hematuria.   Orders placed this encounter:  No orders of the defined types were placed in this encounter.    Derek Jack, MD [place] [phone]   I, Milinda Antis, am acting as a scribe for Dr. Sanda Linger.  I, Derek Jack MD, have reviewed the above documentation for accuracy and completeness, and I agree with the above.

## 2020-05-19 NOTE — Progress Notes (Signed)
Patient presents today for treatment Cycle 1 Folfox. Vital signs within parameters for treatment. Patient has no complaints of any pain today. Patient states the port-cath site is sore. Labs pending. MAR reviewed.   Message received from [facility] LPN/ Dr. Delton Coombes proceed with treatment. Patient to receive Feraheme today per [facility] LPN/ Dr. Delton Coombes.   Treatment given today per MD orders. Tolerated infusion without adverse affects. Vital signs stable. No complaints at this time. 5FU pump infusing per protocol. Daughter at the bedside while teaching performed. Understanding verbalized. RUN noted on screen of 59fu pump. Verified with patient.  Discharged from clinic ambulatory. F/U with [facility] as scheduled.

## 2020-05-19 NOTE — Progress Notes (Signed)
Patient has been assessed, vital signs and labs have been reviewed by Dr. Delton Coombes. ANC, Creatinine, LFTs, and Platelets are within treatment parameters per Dr. Delton Coombes. The patient is good to proceed with treatment at this time. Please also give Feraheme 510mg  today per Dr. Delton Coombes.

## 2020-05-19 NOTE — Patient Instructions (Signed)
[place] at [facility] Discharge Instructions  You were seen today by Dr. Delton Coombes. He went over your recent results. You will receive an infusion of iron today, in addition to your chemotherapy. Please monitor for symptoms of numbness/tingling, cold sensitivity, and diarrhea, and please inform us if they develop. Take 1 tablet of Imodium only if you develop watery stool. Please continue your routine care with your primary care provider. You will be seen by the NP or PA in 1 week for labs and follow up.   Thank you for choosing [place] at [facility] to provide your oncology and hematology care.  To afford each patient quality time with our provider, please arrive at least 15 minutes before your scheduled appointment time.   If you have a lab appointment with the [facility] please come in thru the Main Entrance and check in at the main information desk  You need to re-schedule your appointment should you arrive 10 or more minutes late.  We strive to give you quality time with our providers, and arriving late affects you and other patients whose appointments are after yours.  Also, if you no show three or more times for appointments you may be dismissed from the clinic at the providers discretion.     Again, thank you for choosing [facility].  Our hope is that these requests will decrease the amount of time that you wait before being seen by our physicians.       _____________________________________________________________  Should you have questions after your visit to [facility], please contact our office at [phone] between the hours of 8:00 a.m. and 4:30 p.m.  Voicemails left after 4:00 p.m. will not be returned until the following business day.  For prescription refill requests, have your pharmacy contact our office and allow 72 hours.    Cancer Center Support Programs:   > Cancer Support Group  2nd  Tuesday of the month 1pm-2pm, Journey Room

## 2020-05-20 DIAGNOSIS — C182 Malignant neoplasm of ascending colon: Secondary | ICD-10-CM | POA: Diagnosis not present

## 2020-05-20 DIAGNOSIS — I1310 Hypertensive heart and chronic kidney disease without heart failure, with stage 1 through stage 4 chronic kidney disease, or unspecified chronic kidney disease: Secondary | ICD-10-CM | POA: Diagnosis not present

## 2020-05-20 DIAGNOSIS — D630 Anemia in neoplastic disease: Secondary | ICD-10-CM | POA: Diagnosis not present

## 2020-05-20 DIAGNOSIS — I708 Atherosclerosis of other arteries: Secondary | ICD-10-CM | POA: Diagnosis not present

## 2020-05-20 DIAGNOSIS — Z483 Aftercare following surgery for neoplasm: Secondary | ICD-10-CM | POA: Diagnosis not present

## 2020-05-20 DIAGNOSIS — N1832 Chronic kidney disease, stage 3b: Secondary | ICD-10-CM | POA: Diagnosis not present

## 2020-05-20 DIAGNOSIS — I088 Other rheumatic multiple valve diseases: Secondary | ICD-10-CM | POA: Diagnosis not present

## 2020-05-20 DIAGNOSIS — I700 Atherosclerosis of aorta: Secondary | ICD-10-CM | POA: Diagnosis not present

## 2020-05-20 DIAGNOSIS — D631 Anemia in chronic kidney disease: Secondary | ICD-10-CM | POA: Diagnosis not present

## 2020-05-21 ENCOUNTER — Other Ambulatory Visit: Payer: Self-pay

## 2020-05-21 ENCOUNTER — Encounter (HOSPITAL_COMMUNITY): Payer: Self-pay

## 2020-05-21 ENCOUNTER — Inpatient Hospital Stay (HOSPITAL_COMMUNITY): Payer: Medicare HMO

## 2020-05-21 VITALS — BP 168/53 | HR 58 | Temp 97.5°F | Resp 18

## 2020-05-21 DIAGNOSIS — C182 Malignant neoplasm of ascending colon: Secondary | ICD-10-CM | POA: Diagnosis not present

## 2020-05-21 DIAGNOSIS — N189 Chronic kidney disease, unspecified: Secondary | ICD-10-CM | POA: Diagnosis not present

## 2020-05-21 DIAGNOSIS — Z5111 Encounter for antineoplastic chemotherapy: Secondary | ICD-10-CM | POA: Diagnosis not present

## 2020-05-21 DIAGNOSIS — D509 Iron deficiency anemia, unspecified: Secondary | ICD-10-CM | POA: Diagnosis not present

## 2020-05-21 DIAGNOSIS — Z452 Encounter for adjustment and management of vascular access device: Secondary | ICD-10-CM | POA: Diagnosis not present

## 2020-05-21 DIAGNOSIS — D631 Anemia in chronic kidney disease: Secondary | ICD-10-CM | POA: Diagnosis not present

## 2020-05-21 MED ORDER — HEPARIN SOD (PORK) LOCK FLUSH 100 UNIT/ML IV SOLN
500.0000 [IU] | Freq: Once | INTRAVENOUS | Status: AC | PRN
  Administered 2020-05-21: 500 [IU]

## 2020-05-21 MED ORDER — SODIUM CHLORIDE 0.9% FLUSH
10.0000 mL | INTRAVENOUS | Status: DC | PRN
  Administered 2020-05-21: 10 mL

## 2020-05-21 NOTE — Progress Notes (Signed)
Gabrielle [name] tolerated 5FU pump well without complaints or incident. 5FU pump discontinued with portacath flushed easily per protocol then de-accessed. VSS Pt discharged self ambulatory in satisfactory condition

## 2020-05-21 NOTE — Patient Instructions (Signed)
[place] at [facility] Discharge Instructions  5FU pump discontinued with portacath flushed per protocol. Follow-up as scheduled   Thank you for choosing [place] at [facility] to provide your oncology and hematology care.  To afford each patient quality time with our provider, please arrive at least 15 minutes before your scheduled appointment time.   If you have a lab appointment with the [facility] please come in thru the Main Entrance and check in at the main information desk.  You need to re-schedule your appointment should you arrive 10 or more minutes late.  We strive to give you quality time with our providers, and arriving late affects you and other patients whose appointments are after yours.  Also, if you no show three or more times for appointments you may be dismissed from the clinic at the providers discretion.     Again, thank you for choosing [facility].  Our hope is that these requests will decrease the amount of time that you wait before being seen by our physicians.       _____________________________________________________________  Should you have questions after your visit to [facility], please contact our office at [phone] between the hours of 8:00 a.m. and 4:30 p.m.  Voicemails left after 4:00 p.m. will not be returned until the following business day.  For prescription refill requests, have your pharmacy contact our office and allow 72 hours.    Due to Covid, you will need to wear a mask upon entering the hospital. If you do not have a mask, a mask will be given to you at the Main Entrance upon arrival. For doctor visits, patients may have 1 support person with them. For treatment visits, patients can not have anyone with them due to social distancing guidelines and our immunocompromised population.

## 2020-05-22 NOTE — Progress Notes (Signed)
14:15 24 hour call back. Spoke with daughter Lovey Newcomer. Daughter states her mother is doing great. No decreased appetite, nausea, or diarrhea. Daughter states they are giving her Compazine prophylactic and her mother is doing great.

## 2020-05-26 ENCOUNTER — Inpatient Hospital Stay (HOSPITAL_COMMUNITY): Payer: Medicare HMO

## 2020-05-26 ENCOUNTER — Telehealth (HOSPITAL_COMMUNITY): Payer: Self-pay | Admitting: Hematology

## 2020-05-26 ENCOUNTER — Ambulatory Visit (HOSPITAL_COMMUNITY): Payer: Medicare HMO

## 2020-05-26 ENCOUNTER — Other Ambulatory Visit (HOSPITAL_COMMUNITY): Payer: Medicare HMO | Admitting: General Practice

## 2020-05-26 ENCOUNTER — Ambulatory Visit (HOSPITAL_COMMUNITY): Payer: Medicare HMO | Admitting: Nurse Practitioner

## 2020-05-26 ENCOUNTER — Inpatient Hospital Stay (HOSPITAL_COMMUNITY): Payer: Medicare HMO | Admitting: General Practice

## 2020-05-26 DIAGNOSIS — C182 Malignant neoplasm of ascending colon: Secondary | ICD-10-CM

## 2020-05-26 NOTE — Progress Notes (Signed)
[facility] Initial Psychosocial Assessment Clinical Social Work  Clinical Social Work contacted by phone to assess psychosocial, emotional, mental health, and spiritual needs of the patient. Spoke w daughter who is her primary caregiver at this point.  Patient is hard of hearing, cannot hear/communicate over the phone.  Patient wears hearing aid, "sometimes she catches everything, sometimes she just agrees because she cannot hear."  Daughter tries to attend appointments to make sure she is understanding what is said.  Wants people to "speak loudly."  Cannot see well either.    Barriers to care/review of distress screen:  - Transportation:  Do you anticipate any problems getting to appointments?  Do you have someone who can help run errands for you if you need it?  Daughter is primary transport, needs help w gas.  - Help at home:  What is your living situation (alone, family, other)?  If you are physically unable to care for yourself, who would you call on to help you? Lives w daughter temporarily in [place].  Has her own apartment in elderly/disabled apartment - vert supportive community.  Hopes to be able to return when she is able and doesn't need to  Live w daughter.   - Support system:  What does your support system look like?  Who would you call on if you needed some kind of practical help?  What if you needed someone to talk to for emotional support?  Lot of friends, husband deceased, [address] for food.   - Finances:  Are you concerned about finances.  Considering returning to work?  If not, applying for disability?  On fixed income, finances are tight.  Concerned about high medical bills.  Gets energy assistance from DSS.  No Food Stamps.    What is your understanding of where you are with your cancer? Its cause?  Your treatment plan and what happens next?  Diagnosed w Stage 3 colorectal cancer. Currently in chemotherapy at [facility], every two weeks for approx 6 months.  Has port for chemotherapy,  comes home w chemo for 2 days, then returns to [facility] to have infusion stopped.  "We dont have to do anything."  Has only had one treatment "she has been fine all week, yesterday and today her stomach is not good."  Is currently having diarrhea.  "It was a surprise to her and everyone, I had just [place] the pet[place]."  "I had no idea anything was wrong w her - I smelled the blood in her stool."  This was the first indication something was wrong.  "She took it a whole lot better than I did."    What are your worries for the future as you begin treatment for cancer?  "She is worried she is a burden on her daughter."  Daughter is not able to return to work because she is 100% involved as a caregiver for patient.  Wants to return to her apartment, but worried she will not be able to afford the rent to maintain it.  Finances are tight and medical bills are high.    What are your hopes and priorities during your treatment? What is important to you? What are your goals for your care?  "Just leave it to God and stay positive."  "She doesn't want to bother anyone, so she will say she's fine."  Per daughter, she doesn't like to admit she has any needs.  Roles have changed between patient and daughter - daughter is now a caregiver.  Has great difficulty hearing/seeing - she wont let you know she cant hear or see, will agree without complete understanding of situation.    CSW Summary:  Patient and family psychosocial functioning including strengths, limitations, and coping skills:  74 year old divorced female, diagnosed w colon cancer, currently in chemotherapy.  Was living independently in a seniors apartment community - now lives w daughter as she is unable to care for herself as she used to.  Hopes to return to her own apartment, but unclear whether she will be physically able to do so.  Has a stoic, matter of fact approach to her diagnosis and treatment options. Per daughter, she is reluctant to voice her  needs and also has difficulty hearing/seeing.  She may tend to agree without a full understanding. Daughter is out of work due to caregiving needs, patient feels like she is a burden to daughter.  Has supportive friends, was attending church until she became ill - many people call to check on her.  While in her own apartment, she received help from [address] w food distribution - various members of the apartment complex cooked for each other.  This was a help bc she has difficulty seeing well enough to cook for herself.  Daughter wonders if patient might qualify for CAP services - she would like to be mother's paid caregiver if possible.    Identifications of barriers to care: financial strain on daughter who is now primary caregiver, affording gas to get to/from appointments.   Availability of community resources:  Simona Huh at [employer] for information on CAP program, referral to Estate manager/land agent for possible help w gas, [employer] for general help w support and basic needs.    Clinical Social Worker follow up needed: Yes.    Will check in by phone in 3 weeks to determine progress/needs.    Edwyna Shell, LCSW Clinical Social Worker Phone:  [phone]

## 2020-05-27 ENCOUNTER — Other Ambulatory Visit: Payer: Self-pay

## 2020-05-27 ENCOUNTER — Inpatient Hospital Stay (HOSPITAL_COMMUNITY)
Admission: EM | Admit: 2020-05-27 | Discharge: 2020-05-30 | DRG: 682 | Disposition: A | Discharge status: Home / Self Care | Payer: Medicare HMO | Attending: Internal Medicine | Admitting: Internal Medicine

## 2020-05-27 ENCOUNTER — Encounter (HOSPITAL_COMMUNITY): Payer: Self-pay | Admitting: Emergency Medicine

## 2020-05-27 ENCOUNTER — Telehealth (HOSPITAL_COMMUNITY): Payer: Self-pay | Admitting: Hematology

## 2020-05-27 DIAGNOSIS — M8580 Other specified disorders of bone density and structure, unspecified site: Secondary | ICD-10-CM | POA: Diagnosis present

## 2020-05-27 DIAGNOSIS — C189 Malignant neoplasm of colon, unspecified: Secondary | ICD-10-CM | POA: Diagnosis not present

## 2020-05-27 DIAGNOSIS — T451X5A Adverse effect of antineoplastic and immunosuppressive drugs, initial encounter: Secondary | ICD-10-CM | POA: Diagnosis not present

## 2020-05-27 DIAGNOSIS — N1831 Chronic kidney disease, stage 3a: Secondary | ICD-10-CM | POA: Diagnosis present

## 2020-05-27 DIAGNOSIS — D6481 Anemia due to antineoplastic chemotherapy: Secondary | ICD-10-CM | POA: Diagnosis not present

## 2020-05-27 DIAGNOSIS — Z66 Do not resuscitate: Secondary | ICD-10-CM | POA: Diagnosis not present

## 2020-05-27 DIAGNOSIS — K6389 Other specified diseases of intestine: Secondary | ICD-10-CM | POA: Diagnosis not present

## 2020-05-27 DIAGNOSIS — N179 Acute kidney failure, unspecified: Principal | ICD-10-CM | POA: Diagnosis present

## 2020-05-27 DIAGNOSIS — R1111 Vomiting without nausea: Secondary | ICD-10-CM | POA: Diagnosis not present

## 2020-05-27 DIAGNOSIS — I10 Essential (primary) hypertension: Secondary | ICD-10-CM | POA: Diagnosis not present

## 2020-05-27 DIAGNOSIS — Z79899 Other long term (current) drug therapy: Secondary | ICD-10-CM

## 2020-05-27 DIAGNOSIS — I1310 Hypertensive heart and chronic kidney disease without heart failure, with stage 1 through stage 4 chronic kidney disease, or unspecified chronic kidney disease: Secondary | ICD-10-CM | POA: Diagnosis present

## 2020-05-27 DIAGNOSIS — M1990 Unspecified osteoarthritis, unspecified site: Secondary | ICD-10-CM | POA: Diagnosis present

## 2020-05-27 DIAGNOSIS — R197 Diarrhea, unspecified: Secondary | ICD-10-CM | POA: Diagnosis not present

## 2020-05-27 DIAGNOSIS — Z8249 Family history of ischemic heart disease and other diseases of the circulatory system: Secondary | ICD-10-CM

## 2020-05-27 DIAGNOSIS — Z801 Family history of malignant neoplasm of trachea, bronchus and lung: Secondary | ICD-10-CM

## 2020-05-27 DIAGNOSIS — E876 Hypokalemia: Secondary | ICD-10-CM | POA: Diagnosis present

## 2020-05-27 DIAGNOSIS — E785 Hyperlipidemia, unspecified: Secondary | ICD-10-CM | POA: Diagnosis present

## 2020-05-27 DIAGNOSIS — D61810 Antineoplastic chemotherapy induced pancytopenia: Secondary | ICD-10-CM | POA: Diagnosis not present

## 2020-05-27 DIAGNOSIS — R112 Nausea with vomiting, unspecified: Secondary | ICD-10-CM

## 2020-05-27 DIAGNOSIS — R9431 Abnormal electrocardiogram [ECG] [EKG]: Secondary | ICD-10-CM | POA: Diagnosis present

## 2020-05-27 DIAGNOSIS — N189 Chronic kidney disease, unspecified: Secondary | ICD-10-CM | POA: Diagnosis present

## 2020-05-27 DIAGNOSIS — Z7983 Long term (current) use of bisphosphonates: Secondary | ICD-10-CM

## 2020-05-27 DIAGNOSIS — Z20822 Contact with and (suspected) exposure to covid-19: Secondary | ICD-10-CM | POA: Diagnosis present

## 2020-05-27 DIAGNOSIS — H3530 Unspecified macular degeneration: Secondary | ICD-10-CM | POA: Diagnosis present

## 2020-05-27 DIAGNOSIS — C182 Malignant neoplasm of ascending colon: Secondary | ICD-10-CM | POA: Diagnosis present

## 2020-05-27 DIAGNOSIS — Z9049 Acquired absence of other specified parts of digestive tract: Secondary | ICD-10-CM | POA: Diagnosis not present

## 2020-05-27 DIAGNOSIS — E860 Dehydration: Secondary | ICD-10-CM | POA: Diagnosis not present

## 2020-05-27 DIAGNOSIS — N1832 Chronic kidney disease, stage 3b: Secondary | ICD-10-CM | POA: Diagnosis not present

## 2020-05-27 DIAGNOSIS — R0902 Hypoxemia: Secondary | ICD-10-CM | POA: Diagnosis not present

## 2020-05-27 DIAGNOSIS — Z823 Family history of stroke: Secondary | ICD-10-CM | POA: Diagnosis not present

## 2020-05-27 DIAGNOSIS — I959 Hypotension, unspecified: Secondary | ICD-10-CM | POA: Diagnosis not present

## 2020-05-27 DIAGNOSIS — Z833 Family history of diabetes mellitus: Secondary | ICD-10-CM

## 2020-05-27 DIAGNOSIS — R000 Tachycardia, unspecified: Secondary | ICD-10-CM | POA: Diagnosis not present

## 2020-05-27 DIAGNOSIS — R1110 Vomiting, unspecified: Secondary | ICD-10-CM | POA: Diagnosis not present

## 2020-05-27 HISTORY — DX: Malignant (primary) neoplasm, unspecified: C80.1

## 2020-05-27 HISTORY — DX: Malignant neoplasm of colon, unspecified: C18.9

## 2020-05-27 MED ORDER — ONDANSETRON HCL 4 MG/2ML IJ SOLN
4.0000 mg | Freq: Once | INTRAMUSCULAR | Status: AC
  Administered 2020-05-27: 4 mg via INTRAVENOUS
  Filled 2020-05-27: qty 2

## 2020-05-27 MED ORDER — SODIUM CHLORIDE 0.9 % IV BOLUS
1000.0000 mL | Freq: Once | INTRAVENOUS | Status: AC
  Administered 2020-05-27: 1000 mL via INTRAVENOUS

## 2020-05-27 NOTE — ED Triage Notes (Addendum)
Pt brought in by EMS for N/V/D since Sunday. Pt's sister also had same symptoms but "has gotten over hers". Pt with recent dx Colon CA and just started chemo last week. Blood glucose 137 per EMS. EMS also stated pt states she runs a low BP but that when pt stood to go to stretcher for them, she became orthostatic with sbp in 60's.

## 2020-05-27 NOTE — Telephone Encounter (Signed)
Spoke to pts daughter Lean Jaeger about enrolling pt into the [employer] pt assist fund. Pt is below the 250% poverty guidelines. Advised to bring proof of income on next visit.

## 2020-05-27 NOTE — ED Notes (Signed)
EKG done by this tech, given to Dr Wyvonnia Dusky.

## 2020-05-28 ENCOUNTER — Emergency Department (HOSPITAL_COMMUNITY): Payer: Medicare HMO

## 2020-05-28 DIAGNOSIS — T451X5A Adverse effect of antineoplastic and immunosuppressive drugs, initial encounter: Secondary | ICD-10-CM | POA: Diagnosis present

## 2020-05-28 DIAGNOSIS — N1832 Chronic kidney disease, stage 3b: Secondary | ICD-10-CM | POA: Diagnosis not present

## 2020-05-28 DIAGNOSIS — I959 Hypotension, unspecified: Secondary | ICD-10-CM | POA: Diagnosis present

## 2020-05-28 DIAGNOSIS — R197 Diarrhea, unspecified: Secondary | ICD-10-CM

## 2020-05-28 DIAGNOSIS — Z20822 Contact with and (suspected) exposure to covid-19: Secondary | ICD-10-CM | POA: Diagnosis present

## 2020-05-28 DIAGNOSIS — M8580 Other specified disorders of bone density and structure, unspecified site: Secondary | ICD-10-CM | POA: Diagnosis present

## 2020-05-28 DIAGNOSIS — C189 Malignant neoplasm of colon, unspecified: Secondary | ICD-10-CM | POA: Diagnosis not present

## 2020-05-28 DIAGNOSIS — N189 Chronic kidney disease, unspecified: Secondary | ICD-10-CM | POA: Diagnosis present

## 2020-05-28 DIAGNOSIS — H3530 Unspecified macular degeneration: Secondary | ICD-10-CM | POA: Diagnosis present

## 2020-05-28 DIAGNOSIS — Z8249 Family history of ischemic heart disease and other diseases of the circulatory system: Secondary | ICD-10-CM | POA: Diagnosis not present

## 2020-05-28 DIAGNOSIS — N179 Acute kidney failure, unspecified: Secondary | ICD-10-CM | POA: Diagnosis present

## 2020-05-28 DIAGNOSIS — D61810 Antineoplastic chemotherapy induced pancytopenia: Secondary | ICD-10-CM | POA: Diagnosis present

## 2020-05-28 DIAGNOSIS — E860 Dehydration: Secondary | ICD-10-CM | POA: Diagnosis present

## 2020-05-28 DIAGNOSIS — N1831 Chronic kidney disease, stage 3a: Secondary | ICD-10-CM | POA: Diagnosis present

## 2020-05-28 DIAGNOSIS — R112 Nausea with vomiting, unspecified: Secondary | ICD-10-CM | POA: Diagnosis present

## 2020-05-28 DIAGNOSIS — E876 Hypokalemia: Secondary | ICD-10-CM | POA: Diagnosis present

## 2020-05-28 DIAGNOSIS — Z7983 Long term (current) use of bisphosphonates: Secondary | ICD-10-CM | POA: Diagnosis not present

## 2020-05-28 DIAGNOSIS — I1310 Hypertensive heart and chronic kidney disease without heart failure, with stage 1 through stage 4 chronic kidney disease, or unspecified chronic kidney disease: Secondary | ICD-10-CM | POA: Diagnosis present

## 2020-05-28 DIAGNOSIS — Z9049 Acquired absence of other specified parts of digestive tract: Secondary | ICD-10-CM | POA: Diagnosis not present

## 2020-05-28 DIAGNOSIS — C182 Malignant neoplasm of ascending colon: Secondary | ICD-10-CM | POA: Diagnosis present

## 2020-05-28 DIAGNOSIS — E785 Hyperlipidemia, unspecified: Secondary | ICD-10-CM | POA: Diagnosis present

## 2020-05-28 DIAGNOSIS — R9431 Abnormal electrocardiogram [ECG] [EKG]: Secondary | ICD-10-CM | POA: Diagnosis present

## 2020-05-28 DIAGNOSIS — Z801 Family history of malignant neoplasm of trachea, bronchus and lung: Secondary | ICD-10-CM | POA: Diagnosis not present

## 2020-05-28 DIAGNOSIS — Z823 Family history of stroke: Secondary | ICD-10-CM | POA: Diagnosis not present

## 2020-05-28 DIAGNOSIS — Z79899 Other long term (current) drug therapy: Secondary | ICD-10-CM | POA: Diagnosis not present

## 2020-05-28 DIAGNOSIS — M1990 Unspecified osteoarthritis, unspecified site: Secondary | ICD-10-CM | POA: Diagnosis present

## 2020-05-28 DIAGNOSIS — Z66 Do not resuscitate: Secondary | ICD-10-CM | POA: Diagnosis present

## 2020-05-28 LAB — COMPREHENSIVE METABOLIC PANEL
ALT: 7 U/L (ref 0–44)
ALT: 8 U/L (ref 0–44)
AST: 12 U/L — ABNORMAL LOW (ref 15–41)
AST: 12 U/L — ABNORMAL LOW (ref 15–41)
Albumin: 2.6 g/dL — ABNORMAL LOW (ref 3.5–5.0)
Albumin: 2.7 g/dL — ABNORMAL LOW (ref 3.5–5.0)
Alkaline Phosphatase: 56 U/L (ref 38–126)
Alkaline Phosphatase: 59 U/L (ref 38–126)
Anion gap: 12 (ref 5–15)
Anion gap: 12 (ref 5–15)
BUN: 35 mg/dL — ABNORMAL HIGH (ref 8–23)
BUN: 37 mg/dL — ABNORMAL HIGH (ref 8–23)
CO2: 23 mmol/L (ref 22–32)
CO2: 25 mmol/L (ref 22–32)
Calcium: 7.4 mg/dL — ABNORMAL LOW (ref 8.9–10.3)
Calcium: 7.5 mg/dL — ABNORMAL LOW (ref 8.9–10.3)
Chloride: 101 mmol/L (ref 98–111)
Chloride: 98 mmol/L (ref 98–111)
Creatinine, Ser: 2.65 mg/dL — ABNORMAL HIGH (ref 0.44–1.00)
Creatinine, Ser: 2.81 mg/dL — ABNORMAL HIGH (ref 0.44–1.00)
GFR calc Af Amer: 19 mL/min — ABNORMAL LOW (ref >60)
GFR calc Af Amer: 20 mL/min — ABNORMAL LOW (ref >60)
GFR calc non Af Amer: 16 mL/min — ABNORMAL LOW (ref >60)
GFR calc non Af Amer: 17 mL/min — ABNORMAL LOW (ref >60)
Glucose, Bld: 129 mg/dL — ABNORMAL HIGH (ref 70–99)
Glucose, Bld: 140 mg/dL — ABNORMAL HIGH (ref 70–99)
Potassium: 3 mmol/L — ABNORMAL LOW (ref 3.5–5.1)
Potassium: 3.5 mmol/L (ref 3.5–5.1)
Sodium: 135 mmol/L (ref 135–145)
Sodium: 136 mmol/L (ref 135–145)
Total Bilirubin: 0.7 mg/dL (ref 0.3–1.2)
Total Bilirubin: 1 mg/dL (ref 0.3–1.2)
Total Protein: 5.8 g/dL — ABNORMAL LOW (ref 6.5–8.1)
Total Protein: 5.9 g/dL — ABNORMAL LOW (ref 6.5–8.1)

## 2020-05-28 LAB — CBC
HCT: 30.2 % — ABNORMAL LOW (ref 36.0–46.0)
HCT: 31.1 % — ABNORMAL LOW (ref 36.0–46.0)
Hemoglobin: 9.2 g/dL — ABNORMAL LOW (ref 12.0–15.0)
Hemoglobin: 9.6 g/dL — ABNORMAL LOW (ref 12.0–15.0)
MCH: 29.1 pg (ref 26.0–34.0)
MCH: 29.6 pg (ref 26.0–34.0)
MCHC: 30.5 g/dL (ref 30.0–36.0)
MCHC: 30.9 g/dL (ref 30.0–36.0)
MCV: 95.6 fL (ref 80.0–100.0)
MCV: 96 fL (ref 80.0–100.0)
Platelets: 116 10*3/uL — ABNORMAL LOW (ref 150–400)
Platelets: 130 10*3/uL — ABNORMAL LOW (ref 150–400)
RBC: 3.16 MIL/uL — ABNORMAL LOW (ref 3.87–5.11)
RBC: 3.24 MIL/uL — ABNORMAL LOW (ref 3.87–5.11)
RDW: 19.1 % — ABNORMAL HIGH (ref 11.5–15.5)
RDW: 19.1 % — ABNORMAL HIGH (ref 11.5–15.5)
WBC: 10.6 10*3/uL — ABNORMAL HIGH (ref 4.0–10.5)
WBC: 9.4 10*3/uL (ref 4.0–10.5)
nRBC: 0 % (ref 0.0–0.2)
nRBC: 0 % (ref 0.0–0.2)

## 2020-05-28 LAB — SARS CORONAVIRUS 2 BY RT PCR (HOSPITAL ORDER, PERFORMED IN ~~LOC~~ HOSPITAL LAB): SARS Coronavirus 2: NEGATIVE

## 2020-05-28 LAB — TROPONIN I (HIGH SENSITIVITY)
Troponin I (High Sensitivity): 36 ng/L — ABNORMAL HIGH (ref <18)
Troponin I (High Sensitivity): 42 ng/L — ABNORMAL HIGH (ref <18)
Troponin I (High Sensitivity): 57 ng/L — ABNORMAL HIGH (ref <18)
Troponin I (High Sensitivity): 57 ng/L — ABNORMAL HIGH (ref <18)

## 2020-05-28 LAB — LIPASE, BLOOD: Lipase: 19 U/L (ref 11–51)

## 2020-05-28 LAB — POC OCCULT BLOOD, ED: Fecal Occult Bld: NEGATIVE

## 2020-05-28 LAB — LACTIC ACID, PLASMA: Lactic Acid, Venous: 1.7 mmol/L (ref 0.5–1.9)

## 2020-05-28 LAB — MRSA PCR SCREENING: MRSA by PCR: NEGATIVE

## 2020-05-28 MED ORDER — ONDANSETRON HCL 4 MG PO TABS: 4.0000 mg | ORAL_TABLET | Freq: Four times a day (QID) | ORAL | Status: DC | PRN

## 2020-05-28 MED ORDER — ACETAMINOPHEN 325 MG PO TABS: 650.0000 mg | ORAL_TABLET | Freq: Four times a day (QID) | ORAL | Status: DC | PRN

## 2020-05-28 MED ORDER — POTASSIUM CHLORIDE IN NACL 20-0.9 MEQ/L-% IV SOLN
INTRAVENOUS | Status: DC
  Filled 2020-05-28 (×2): qty 1000

## 2020-05-28 MED ORDER — CHLORHEXIDINE GLUCONATE CLOTH 2 % EX PADS
6.0000 | MEDICATED_PAD | Freq: Every day | CUTANEOUS | Status: DC
  Administered 2020-05-28 – 2020-05-30 (×3): 6 via TOPICAL

## 2020-05-28 MED ORDER — ONDANSETRON HCL 4 MG/2ML IJ SOLN: 4.0000 mg | Freq: Four times a day (QID) | INTRAMUSCULAR | Status: DC | PRN

## 2020-05-28 MED ORDER — ONDANSETRON HCL 4 MG/2ML IJ SOLN
4.0000 mg | Freq: Once | INTRAMUSCULAR | Status: AC
  Administered 2020-05-28: 4 mg via INTRAVENOUS
  Filled 2020-05-28: qty 2

## 2020-05-28 MED ORDER — SODIUM CHLORIDE 0.9 % IV SOLN: INTRAVENOUS | Status: DC

## 2020-05-28 MED ORDER — DORZOLAMIDE HCL-TIMOLOL MAL 2-0.5 % OP SOLN
1.0000 [drp] | Freq: Every day | OPHTHALMIC | Status: DC
  Administered 2020-05-29 – 2020-05-30 (×2): 1 [drp] via OPHTHALMIC
  Filled 2020-05-28: qty 10

## 2020-05-28 MED ORDER — POTASSIUM CHLORIDE CRYS ER 20 MEQ PO TBCR
20.0000 meq | EXTENDED_RELEASE_TABLET | Freq: Once | ORAL | Status: AC
  Administered 2020-05-28: 20 meq via ORAL
  Filled 2020-05-28: qty 1

## 2020-05-28 MED ORDER — SACCHAROMYCES BOULARDII 250 MG PO CAPS
250.0000 mg | ORAL_CAPSULE | Freq: Two times a day (BID) | ORAL | Status: DC
  Administered 2020-05-28 – 2020-05-30 (×5): 250 mg via ORAL
  Filled 2020-05-28 (×7): qty 1

## 2020-05-28 MED ORDER — ENOXAPARIN SODIUM 30 MG/0.3ML ~~LOC~~ SOLN
30.0000 mg | SUBCUTANEOUS | Status: DC
  Administered 2020-05-28 – 2020-05-29 (×2): 30 mg via SUBCUTANEOUS
  Filled 2020-05-28 (×2): qty 0.3

## 2020-05-28 MED ORDER — ACETAMINOPHEN 650 MG RE SUPP: 650.0000 mg | Freq: Four times a day (QID) | RECTAL | Status: DC | PRN

## 2020-05-28 NOTE — H&P (Addendum)
[facility] H&P    Patient Demographics:    Gabrielle [name], is a 74 y.o. female  MRN: [id number]  DOB - 1946-10-02  Admit Date - 05/27/2020  Referring MD/NP/PA: Carleene Overlie  Outpatient Primary MD for the patient is Janora Norlander, DO  Patient coming from: Home  Chief complaint-vomiting and diarrhea   HPI:    Gabrielle [name]  is a 74 y.o. female, with history of hypertension, hyperlipidemia, colon cancer, status post right hemicolectomy, currently on chemotherapy.  Patient completed her first chemotherapy last week on 17th.  She developed nausea and vomiting along with diarrhea over past 2 to 3 days.  Patient said that she had 4-5 episodes of diarrhea.  Also had vomiting episodes.  EMS was called, patient's initial blood pressure was in 60s.  No history of dizziness or passing out.  Denies chest pain or shortness of breath. In the ED lab work showed patient had acute kidney injury, her creatinine was 2.81. Baseline creatinine is around 1.12 as of 05/19/2020. Patient started on IV normal saline.    Review of systems:    In addition to the HPI above,    All other systems reviewed and are negative.    Past History of the following :    Past Medical History:  Diagnosis Date  . Arthritis   . Cancer ([place])   . Cataract    OU  . Colon cancer ([place])   . H/O cesarean section   . Hx of tonsillectomy   . Hyperlipidemia   . Hypertension   . Macular degeneration    Exu ARMD OU      Past Surgical History:  Procedure Laterality Date  . BIOPSY  04/06/2020   Procedure: BIOPSY;  Surgeon: Rogene Houston, MD;  Location: [facility] ENDO SUITE;  Service: Endoscopy;;  . CARPAL TUNNEL RELEASE Right   . CATARACT EXTRACTION W/PHACO Left 10/11/2019   Procedure: CATARACT EXTRACTION PHACO AND INTRAOCULAR LENS PLACEMENT (IOC);  Surgeon: Baruch Goldmann, MD;  Location: [facility] ORS;  Service: Ophthalmology;  Laterality: Left;  CDE: 8.56   . CATARACT EXTRACTION W/PHACO Right 10/25/2019   Procedure: CATARACT EXTRACTION PHACO AND INTRAOCULAR LENS PLACEMENT (IOC);  Surgeon: Baruch Goldmann, MD;  Location: [facility] ORS;  Service: Ophthalmology;  Laterality: Right;  CDE: 5.67  . CESAREAN SECTION    . COLONOSCOPY N/A 04/06/2020   Procedure: COLONOSCOPY;  Surgeon: Rogene Houston, MD;  Location: [facility] ENDO SUITE;  Service: Endoscopy;  Laterality: N/A;  . CYSTOSCOPY WITH BIOPSY N/A 04/08/2020   Procedure: CYSTOSCOPY WITH BIOPSY;  Surgeon: Cleon Gustin, MD;  Location: [facility] ORS;  Service: Urology;  Laterality: N/A;  . ESOPHAGOGASTRODUODENOSCOPY N/A 04/05/2020   Procedure: ESOPHAGOGASTRODUODENOSCOPY (EGD);  Surgeon: Rogene Houston, MD;  Location: [facility];  Service: Endoscopy;  Laterality: N/A;  . PARTIAL COLECTOMY N/A 04/08/2020   Procedure: PARTIAL COLECTOMY;  Surgeon: Virl Cagey, MD;  Location: [facility] ORS;  Service: General;  Laterality: N/A;  . PORTACATH PLACEMENT Left 05/18/2020   Procedure: INSERTION PORT-A-CATH (ATTACHED CATHETER IN LEFT SUBCLAVIAN);  Surgeon: Constance Haw,  Lanell Matar, MD;  Location: [facility] ORS;  Service: General;  Laterality: Left;  . TONSILLECTOMY        Social History:      Social History   Tobacco Use  . Smoking status: Never Smoker  . Smokeless tobacco: Never Used  Substance Use Topics  . Alcohol use: No       Family History :     Family History  Problem Relation Age of Onset  . Macular degeneration Mother   . Stroke Mother   . Hypertension Mother   . Dementia Mother   . Cancer Father        lung  . Diabetes Sister   . Hypertension Sister        Home Medications:   Prior to Admission medications   Medication Sig Start Date End Date Taking? Authorizing Provider  acetaminophen (TYLENOL) 500 MG tablet Take 1,000 mg by mouth every 6 (six) hours as needed for moderate pain.  Patient not taking: Reported on 05/19/2020    [provider]  alendronate (FOSAMAX) 70 MG tablet Take 1 tablet (70  mg total) by mouth every 7 (seven) days. Take with a full glass of water on an empty stomach. 09/09/19   Janora Norlander, DO  atorvastatin (LIPITOR) 20 MG tablet Take 1 tablet (20 mg total) by mouth daily. 03/20/20   Janora Norlander, DO  cholecalciferol (VITAMIN D3) 25 MCG (1000 UT) tablet Take 1,000 Units by mouth daily.    [provider]  diclofenac Sodium (VOLTAREN) 1 % GEL Apply 4 g topically 4 (four) times daily. 03/30/20   Janora Norlander, DO  dorzolamide-timolol (COSOPT) 22.3-6.8 MG/ML ophthalmic solution Place 1 drop into both eyes daily. 03/30/20   Bernarda Caffey, MD  ferrous sulfate 325 (65 FE) MG EC tablet Take 1 tablet (325 mg total) by mouth 2 (two) times daily with a meal. 06/25/19   Janora Norlander, DO  fluorouracil CALGB 50093 in sodium chloride 0.9 % 150 mL Inject into the vein over 48 hr. 05/19/20   [provider]  FLUOROURACIL IV Inject into the vein every 14 (fourteen) days. 05/19/20   [provider]  HYDROcodone-acetaminophen (NORCO/VICODIN) 5-325 MG tablet Take 1 tablet by mouth every 4 (four) hours as needed for severe pain. Patient not taking: Reported on 05/19/2020 05/18/20 05/18/21  Virl Cagey, MD  LEUCOVORIN CALCIUM IV Inject into the vein every 14 (fourteen) days. 05/19/20   [provider]  lidocaine-prilocaine (EMLA) cream Apply a small amount to port a cath site and cover with plastic wrap 1 hour prior to chemotherapy appointments Patient not taking: Reported on 05/19/2020 05/14/20   Derek Jack, MD  losartan-hydrochlorothiazide (HYZAAR) 100-25 MG tablet TAKE 1 TABLET DAILY.  Needs to be seen for further refills. Patient taking differently: Take 1 tablet by mouth daily.  03/20/20   Janora Norlander, DO  OXALIPLATIN IV Inject into the vein every 14 (fourteen) days. 05/19/20   [provider]  potassium chloride SA (KLOR-CON) 20 MEQ tablet Take 1 tablet (20 mEq total) by mouth daily. 04/21/20   Sharion Balloon, FNP  prochlorperazine (COMPAZINE) 10 MG tablet Take 1 tablet (10 mg total) by mouth every 6 (six) hours as needed for nausea or vomiting. Patient not taking: Reported on 05/19/2020 05/14/20   Derek Jack, MD     Allergies:     Allergies  Allergen Reactions  . Lisinopril Cough     Physical Exam:   Vitals  Blood pressure (!) 100/38, pulse 80, temperature 98.3 F (36.8 C), temperature source Oral, resp. rate 13, height 5\' 2"  (1.575 m), weight 62 kg, SpO2 95 %.  1.  General: Appears in no acute distress  2. Psychiatric: Alert, oriented x3, intact insight and judgment  3. Neurologic: Cranial nerves II through XII grossly intact, no focal deficit noted  4. HEENMT:  Atraumatic normocephalic, extraocular muscles are intact  5. Respiratory : Clear to auscultation bilaterally, no wheezing or crackles auscultated  6. Cardiovascular : S1-S2, regular, no murmur auscultated  7. Gastrointestinal:  Abdomen is soft, nontender, no organomegaly  8. Skin:  No rashes noted     Data Review:    CBC Recent Labs  Lab 05/27/20 2355  WBC 10.6*  HGB 9.6*  HCT 31.1*  PLT 116*  MCV 96.0  MCH 29.6  MCHC 30.9  RDW 19.1*   ------------------------------------------------------------------------------------------------------------------  Results for orders placed or performed during the hospital encounter of 05/27/20 (from the past 48 hour(s))  Lipase, blood     Status: None   Collection Time: 05/27/20 11:55 PM  Result Value Ref Range   Lipase 19 11 - 51 U/L    Comment: Performed at [facility] [address], [place], [place] [address]  Comprehensive metabolic panel     Status: Abnormal   Collection Time: 05/27/20 11:55 PM  Result Value Ref Range   Sodium 135 135 - 145 mmol/L   Potassium 3.5 3.5 - 5.1 mmol/L   Chloride 98 98 - 111 mmol/L   CO2 25 22 - 32 mmol/L   Glucose, Bld 140 (H) 70 - 99 mg/dL    Comment: Glucose reference range applies only to  samples taken after fasting for at least 8 hours.   BUN 35 (H) 8 - 23 mg/dL   Creatinine, Ser 2.81 (H) 0.44 - 1.00 mg/dL   Calcium 7.5 (L) 8.9 - 10.3 mg/dL   Total Protein 5.9 (L) 6.5 - 8.1 g/dL   Albumin 2.7 (L) 3.5 - 5.0 g/dL   AST 12 (L) 15 - 41 U/L   ALT 7 0 - 44 U/L   Alkaline Phosphatase 59 38 - 126 U/L   Total Bilirubin 1.0 0.3 - 1.2 mg/dL   GFR calc non Af Amer 16 (L) >60 mL/min   GFR calc Af Amer 19 (L) >60 mL/min   Anion gap 12 5 - 15    Comment: Performed at [facility], [address], [place], [place] [address]  CBC     Status: Abnormal   Collection Time: 05/27/20 11:55 PM  Result Value Ref Range   WBC 10.6 (H) 4.0 - 10.5 K/uL   RBC 3.24 (L) 3.87 - 5.11 MIL/uL   Hemoglobin 9.6 (L) 12.0 - 15.0 g/dL   HCT 31.1 (L) 36 - 46 %   MCV 96.0 80.0 - 100.0 fL   MCH 29.6 26.0 - 34.0 pg   MCHC 30.9 30.0 - 36.0 g/dL   RDW 19.1 (H) 11.5 - 15.5 %   Platelets 116 (L) 150 - 400 K/uL    Comment: SPECIMEN CHECKED FOR CLOTS   nRBC 0.0 0.0 - 0.2 %    Comment: Performed at [facility], [address] [place], [place] [address]  Lactic acid, plasma     Status: None   Collection Time: 05/27/20 11:55 PM  Result Value Ref Range   Lactic Acid, Venous 1.7 0.5 - 1.9 mmol/L    Comment: Performed at [facility], [address], [place], [place]  [address]  Troponin I (High Sensitivity)     Status: Abnormal   Collection Time: 05/27/20 11:55 PM  Result Value Ref Range   Troponin I (High Sensitivity) 36 (H) <18 ng/L    Comment: (NOTE) Elevated high sensitivity troponin I (hsTnI) values and significant  changes across serial measurements may suggest ACS but many other  chronic and acute conditions are known to elevate hsTnI results.  Refer to the "Links" section for chest pain algorithms and additional  guidance. Performed at [facility], [address] [place], [place] [address]   POC occult blood, ED     Status: None   Collection Time: 05/28/20  1:37 AM  Result Value Ref Range   Fecal  Occult Bld NEGATIVE NEGATIVE  Troponin I (High Sensitivity)     Status: Abnormal   Collection Time: 05/28/20  1:44 AM  Result Value Ref Range   Troponin I (High Sensitivity) 42 (H) <18 ng/L    Comment: (NOTE) Elevated high sensitivity troponin I (hsTnI) values and significant  changes across serial measurements may suggest ACS but many other  chronic and acute conditions are known to elevate hsTnI results.  Refer to the "Links" section for chest pain algorithms and additional  guidance. Performed at [facility], [address], [place], [place] [address]     Chemistries  Recent Labs  Lab 05/27/20 2355  NA 135  K 3.5  CL 98  CO2 25  GLUCOSE 140*  BUN 35*  CREATININE 2.81*  CALCIUM 7.5*  AST 12*  ALT 7  ALKPHOS 59  BILITOT 1.0   ------------------------------------------------------------------------------------------------------------------  ------------------------------------------------------------------------------------------------------------------ GFR: Estimated Creatinine Clearance: 15.5 mL/min (A) (by C-G formula based on SCr of 2.81 mg/dL (H)). Liver Function Tests: Recent Labs  Lab 05/27/20 2355  AST 12*  ALT 7  ALKPHOS 59  BILITOT 1.0  PROT 5.9*  ALBUMIN 2.7*   Recent Labs  Lab 05/27/20 2355  LIPASE 19    --------------------------------------------------------------------------------------------------------------- Urine analysis:    Component Value Date/Time   COLORURINE YELLOW 04/04/2020 1758   APPEARANCEUR CLEAR 04/04/2020 1758   LABSPEC 1.008 04/04/2020 1758   PHURINE 8.0 04/04/2020 1758   GLUCOSEU NEGATIVE 04/04/2020 1758   HGBUR NEGATIVE 04/04/2020 1758   BILIRUBINUR NEGATIVE 04/04/2020 1758   KETONESUR NEGATIVE 04/04/2020 1758   PROTEINUR NEGATIVE 04/04/2020 1758   NITRITE NEGATIVE 04/04/2020 1758   LEUKOCYTESUR LARGE (A) 04/04/2020 1758      Imaging Results:    DG Abdomen Acute W/Chest  Result Date: 05/28/2020 CLINICAL  DATA:  Vomiting and diarrhea. Colon cancer, active chemotherapy. EXAM: DG ABDOMEN ACUTE W/ 1V CHEST COMPARISON:  Chest radiograph 05/18/2020 FINDINGS: Left chest port remains in place with tip in the region of the upper SVC. Unchanged heart size and mediastinal contours. No acute airspace disease, pleural effusion or pneumothorax. No evidence of bowel obstruction. Scattered air throughout nondilated small bowel centrally and in the right abdomen. Air within the transverse colon which is slightly featureless. No evidence of free air. No radiopaque calculi or abnormal soft tissue calcifications. Enteric sutures noted in the right abdomen. No acute osseous abnormalities are seen. IMPRESSION: 1. Air within the transverse colon which is featureless and can be seen in the setting of diarrheal process. No evidence of bowel obstruction. 2. No acute chest findings. Electronically Signed   By: Keith Rake M.D.   On: 05/28/2020 01:41    My personal review of EKG: Rhythm NSR, ST depression noted in lead V4, V5, V6, lead I   Assessment &  Plan:    Active Problems:   Acute kidney injury ([place])   1. Acute kidney injury-likely from dehydration from vomiting and diarrhea after chemotherapy.  Patient will be started on IV normal saline +20 mEq KCl at 125 mL/h.  Follow renal function in a.m. 2. Nausea vomiting and diarrhea-likely chemotherapy-induced.  Started on IV normal saline as above.  Continue Zofran as needed for nausea and vomiting. 3. Abnormal EKG-patient has ST depression which are more pronounced than previous EKG noted in leads V4, V5, V6, lead I.  She denies chest pain.  Initial troponin is 36.  Cycle troponin x2. 4. History of colon cancer, followed by oncology as outpatient.  Patient recently started on chemotherapy.    DVT Prophylaxis-   Lovenox   AM Labs Ordered, also please review Full Orders  Family Communication: Admission, patients condition and plan of care including tests being ordered  have been discussed with the patient  who indicate understanding and agree with the plan and Code Status.  Code Status: DNR  Admission status: Observation  Time spent in minutes : 60 minutes   Glendene Wyer S Javoni Lucken M.D

## 2020-05-28 NOTE — Progress Notes (Addendum)
Patient seen and examined. Admitted after midnight with complaints of Nausea, vomiting and diarrhea. Found to be dehydrated and with new AKI on CKD stage 3a as per records reviewed and prior GFR. Soft BP appreciated on her VS, no CP, no fever, no abd pain. Please refer to H&P written by Dr. Darrick Meigs for further info/details on admission.  Plan: -continue IVF's and electrolytes repletion -check Mg level -advance diet as tolerated -continue PRN antiemetics -symptoms appears to be associated with chemotherapy side effects. -follow clinical response -repeat BMET in am -no CP currently, continue telemetry monitoring and follow troponin. -avoid hypotension and nephrotoxic agents. -will add florastor BID.  Time: 30 minutes. > 50% of the time spent on examination, coordination of care, labs interpretation and additional interventions.   Barton Dubois MD [phone]

## 2020-05-28 NOTE — ED Notes (Signed)
ED TO INPATIENT HANDOFF REPORT  ED Nurse Name and Phone #: [phone]  S Name/Age/Gender Gabrielle [name] 74 y.o. female Room/Bed: APA14/APA14  Code Status   Code Status: DNR  Home/SNF/Other Home Patient oriented to: self, place, time and situation Is this baseline? Yes   Triage Complete: Triage complete  Chief Complaint Acute kidney injury [facility]) [N17.9]  Triage Note Pt brought in by EMS for N/V/D since Sunday. Pt's sister also had same symptoms but "has gotten over hers". Pt with recent dx Colon CA and just started chemo last week. Blood glucose 137 per EMS. EMS also stated pt states she runs a low BP but that when pt stood to go to stretcher for them, she became orthostatic with sbp in 60's.    Allergies Allergies  Allergen Reactions  . Lisinopril Cough    Level of Care/Admitting Diagnosis ED Disposition    ED Disposition Condition [facility] Area: [facility] [[id number]]  Level of Care: Stepdown [14]  Covid Evaluation: Asymptomatic Screening Protocol (No Symptoms)  Diagnosis: Acute kidney injury [facility]) [[id number]]  Admitting Physician: Loree Fee  Attending Physician: Loree Fee       B Medical/Surgery History Past Medical History:  Diagnosis Date  . Arthritis   . Cancer ([place])   . Cataract    OU  . Colon cancer ([place])   . H/O cesarean section   . Hx of tonsillectomy   . Hyperlipidemia   . Hypertension   . Macular degeneration    Exu ARMD OU   Past Surgical History:  Procedure Laterality Date  . BIOPSY  04/06/2020   Procedure: BIOPSY;  Surgeon: Rogene Houston, MD;  Location: [facility] ENDO SUITE;  Service: Endoscopy;;  . CARPAL TUNNEL RELEASE Right   . CATARACT EXTRACTION W/PHACO Left 10/11/2019   Procedure: CATARACT EXTRACTION PHACO AND INTRAOCULAR LENS PLACEMENT (IOC);  Surgeon: Baruch Goldmann, MD;  Location: [facility] ORS;  Service: Ophthalmology;  Laterality: Left;  CDE: 8.56  . CATARACT EXTRACTION W/PHACO Right 10/25/2019    Procedure: CATARACT EXTRACTION PHACO AND INTRAOCULAR LENS PLACEMENT (IOC);  Surgeon: Baruch Goldmann, MD;  Location: [facility] ORS;  Service: Ophthalmology;  Laterality: Right;  CDE: 5.67  . CESAREAN SECTION    . COLONOSCOPY N/A 04/06/2020   Procedure: COLONOSCOPY;  Surgeon: Rogene Houston, MD;  Location: [facility] ENDO SUITE;  Service: Endoscopy;  Laterality: N/A;  . CYSTOSCOPY WITH BIOPSY N/A 04/08/2020   Procedure: CYSTOSCOPY WITH BIOPSY;  Surgeon: Cleon Gustin, MD;  Location: [facility] ORS;  Service: Urology;  Laterality: N/A;  . ESOPHAGOGASTRODUODENOSCOPY N/A 04/05/2020   Procedure: ESOPHAGOGASTRODUODENOSCOPY (EGD);  Surgeon: Rogene Houston, MD;  Location: [facility] ENDO SUITE;  Service: Endoscopy;  Laterality: N/A;  . PARTIAL COLECTOMY N/A 04/08/2020   Procedure: PARTIAL COLECTOMY;  Surgeon: Virl Cagey, MD;  Location: [facility] ORS;  Service: General;  Laterality: N/A;  . PORTACATH PLACEMENT Left 05/18/2020   Procedure: INSERTION PORT-A-CATH (ATTACHED CATHETER IN LEFT SUBCLAVIAN);  Surgeon: Virl Cagey, MD;  Location: [facility] ORS;  Service: General;  Laterality: Left;  . TONSILLECTOMY       A IV Location/Drains/Wounds Patient Lines/Drains/Airways Status    Active Line/Drains/Airways    Name Placement date Placement time Site Days   Implanted Port 05/18/20 Left Chest 05/18/20  0756  Chest  10   Peripheral IV 05/27/20 Left Hand 05/27/20  2326  Hand  1   Airway 7 mm 04/08/20  1037   50   Incision (  Closed) 04/08/20 Perineum Other (Comment) 04/08/20  1143   50   Incision (Closed) 04/08/20 Abdomen Other (Comment) 04/08/20  1237   50   Incision (Closed) 05/18/20 Chest Left 05/18/20  0817   10          Intake/Output Last 24 hours  Intake/Output Summary (Last 24 hours) at 05/28/2020 1342 Last data filed at 05/28/2020 6789 Gross per 24 hour  Intake 1562.76 ml  Output --  Net 1562.76 ml    Labs/Imaging Results for orders placed or performed during the hospital encounter of 05/27/20 (from the past 48  hour(s))  Lipase, blood     Status: None   Collection Time: 05/27/20 11:55 PM  Result Value Ref Range   Lipase 19 11 - 51 U/L    Comment: Performed at [facility], [address], [place], [place] [address]  Comprehensive metabolic panel     Status: Abnormal   Collection Time: 05/27/20 11:55 PM  Result Value Ref Range   Sodium 135 135 - 145 mmol/L   Potassium 3.5 3.5 - 5.1 mmol/L   Chloride 98 98 - 111 mmol/L   CO2 25 22 - 32 mmol/L   Glucose, Bld 140 (H) 70 - 99 mg/dL    Comment: Glucose reference range applies only to samples taken after fasting for at least 8 hours.   BUN 35 (H) 8 - 23 mg/dL   Creatinine, Ser 2.81 (H) 0.44 - 1.00 mg/dL   Calcium 7.5 (L) 8.9 - 10.3 mg/dL   Total Protein 5.9 (L) 6.5 - 8.1 g/dL   Albumin 2.7 (L) 3.5 - 5.0 g/dL   AST 12 (L) 15 - 41 U/L   ALT 7 0 - 44 U/L   Alkaline Phosphatase 59 38 - 126 U/L   Total Bilirubin 1.0 0.3 - 1.2 mg/dL   GFR calc non Af Amer 16 (L) >60 mL/min   GFR calc Af Amer 19 (L) >60 mL/min   Anion gap 12 5 - 15    Comment: Performed at [facility], [address], [place], [place] [address]  CBC     Status: Abnormal   Collection Time: 05/27/20 11:55 PM  Result Value Ref Range   WBC 10.6 (H) 4.0 - 10.5 K/uL   RBC 3.24 (L) 3.87 - 5.11 MIL/uL   Hemoglobin 9.6 (L) 12.0 - 15.0 g/dL   HCT 31.1 (L) 36 - 46 %   MCV 96.0 80.0 - 100.0 fL   MCH 29.6 26.0 - 34.0 pg   MCHC 30.9 30.0 - 36.0 g/dL   RDW 19.1 (H) 11.5 - 15.5 %   Platelets 116 (L) 150 - 400 K/uL    Comment: SPECIMEN CHECKED FOR CLOTS   nRBC 0.0 0.0 - 0.2 %    Comment: Performed at [facility], [address], [place], [place] [address]  Lactic acid, plasma     Status: None   Collection Time: 05/27/20 11:55 PM  Result Value Ref Range   Lactic Acid, Venous 1.7 0.5 - 1.9 mmol/L    Comment: Performed at [facility], [address], [place], [place] [address]  Troponin I (High Sensitivity)     Status: Abnormal   Collection Time: 05/27/20 11:55 PM  Result Value Ref Range    Troponin I (High Sensitivity) 36 (H) <18 ng/L    Comment: (NOTE) Elevated high sensitivity troponin I (hsTnI) values and significant  changes across serial measurements may suggest ACS but many other  chronic and acute conditions are known to elevate  hsTnI results.  Refer to the "Links" section for chest pain algorithms and additional  guidance. Performed at [facility], [address] [place], [place] [address]   POC occult blood, ED     Status: None   Collection Time: 05/28/20  1:37 AM  Result Value Ref Range   Fecal Occult Bld NEGATIVE NEGATIVE  Troponin I (High Sensitivity)     Status: Abnormal   Collection Time: 05/28/20  1:44 AM  Result Value Ref Range   Troponin I (High Sensitivity) 42 (H) <18 ng/L    Comment: (NOTE) Elevated high sensitivity troponin I (hsTnI) values and significant  changes across serial measurements may suggest ACS but many other  chronic and acute conditions are known to elevate hsTnI results.  Refer to the "Links" section for chest pain algorithms and additional  guidance. Performed at [facility], [address] [place], [place] [address]   SARS Coronavirus 2 by RT PCR (hospital order, performed in [facility] hospital lab) Nasopharyngeal Nasopharyngeal Swab     Status: None   Collection Time: 05/28/20  1:57 AM   Specimen: Nasopharyngeal Swab  Result Value Ref Range   SARS Coronavirus 2 NEGATIVE NEGATIVE    Comment: (NOTE) SARS-CoV-2 target nucleic acids are NOT DETECTED.  The SARS-CoV-2 RNA is generally detectable in upper and lower respiratory specimens during the acute phase of infection. The lowest concentration of SARS-CoV-2 viral copies this assay can detect is 250 copies / mL. A negative result does not preclude SARS-CoV-2 infection and should not be used as the sole basis for treatment or other patient management decisions.  A negative result may occur with improper specimen collection / handling, submission of specimen other than  nasopharyngeal swab, presence of viral mutation(s) within the areas targeted by this assay, and inadequate number of viral copies (<250 copies / mL). A negative result must be combined with clinical observations, patient history, and epidemiological information.  Fact Sheet for Patients:   StrictlyIdeas.no  Fact Sheet for Healthcare Providers: BankingDealers.co.za  This test is not yet approved or  cleared by the [place] FDA and has been authorized for detection and/or diagnosis of SARS-CoV-2 by FDA under an Emergency Use Authorization (EUA).  This EUA will remain in effect (meaning this test can be used) for the duration of the COVID-19 declaration under Section 564(b)(1) of the Act, 21 U.S.C. section 360bbb-3(b)(1), unless the authorization is terminated or revoked sooner.  Performed at [facility], [address] [place], [place] [address]   CBC     Status: Abnormal   Collection Time: 05/28/20  4:02 AM  Result Value Ref Range   WBC 9.4 4.0 - 10.5 K/uL   RBC 3.16 (L) 3.87 - 5.11 MIL/uL   Hemoglobin 9.2 (L) 12.0 - 15.0 g/dL   HCT 30.2 (L) 36 - 46 %   MCV 95.6 80.0 - 100.0 fL   MCH 29.1 26.0 - 34.0 pg   MCHC 30.5 30.0 - 36.0 g/dL   RDW 19.1 (H) 11.5 - 15.5 %   Platelets 130 (L) 150 - 400 K/uL   nRBC 0.0 0.0 - 0.2 %    Comment: Performed at [facility], [address], [place], [place] [address]  Comprehensive metabolic panel     Status: Abnormal   Collection Time: 05/28/20  4:02 AM  Result Value Ref Range   Sodium 136 135 - 145 mmol/L   Potassium 3.0 (L) 3.5 - 5.1 mmol/L   Chloride 101 98 - 111 mmol/L  CO2 23 22 - 32 mmol/L   Glucose, Bld 129 (H) 70 - 99 mg/dL    Comment: Glucose reference range applies only to samples taken after fasting for at least 8 hours.   BUN 37 (H) 8 - 23 mg/dL   Creatinine, Ser 2.65 (H) 0.44 - 1.00 mg/dL   Calcium 7.4 (L) 8.9 - 10.3 mg/dL   Total Protein 5.8 (L) 6.5 - 8.1 g/dL   Albumin 2.6  (L) 3.5 - 5.0 g/dL   AST 12 (L) 15 - 41 U/L   ALT 8 0 - 44 U/L   Alkaline Phosphatase 56 38 - 126 U/L   Total Bilirubin 0.7 0.3 - 1.2 mg/dL   GFR calc non Af Amer 17 (L) >60 mL/min   GFR calc Af Amer 20 (L) >60 mL/min   Anion gap 12 5 - 15    Comment: Performed at [facility], [address], [place], [place] [address]  Troponin I (High Sensitivity)     Status: Abnormal   Collection Time: 05/28/20  7:18 AM  Result Value Ref Range   Troponin I (High Sensitivity) 57 (H) <18 ng/L    Comment: (NOTE) Elevated high sensitivity troponin I (hsTnI) values and significant  changes across serial measurements may suggest ACS but many other  chronic and acute conditions are known to elevate hsTnI results.  Refer to the "Links" section for chest pain algorithms and additional  guidance. Performed at [facility], [address], [place], [place] [address]   Troponin I (High Sensitivity)     Status: Abnormal   Collection Time: 05/28/20  8:55 AM  Result Value Ref Range   Troponin I (High Sensitivity) 57 (H) <18 ng/L    Comment: (NOTE) Elevated high sensitivity troponin I (hsTnI) values and significant  changes across serial measurements may suggest ACS but many other  chronic and acute conditions are known to elevate hsTnI results.  Refer to the "Links" section for chest pain algorithms and additional  guidance. Performed at [facility], [address], [place], [place] [address]    DG Abdomen Acute W/Chest  Result Date: 05/28/2020 CLINICAL DATA:  Vomiting and diarrhea. Colon cancer, active chemotherapy. EXAM: DG ABDOMEN ACUTE W/ 1V CHEST COMPARISON:  Chest radiograph 05/18/2020 FINDINGS: Left chest port remains in place with tip in the region of the upper SVC. Unchanged heart size and mediastinal contours. No acute airspace disease, pleural effusion or pneumothorax. No evidence of bowel obstruction. Scattered air throughout nondilated small bowel centrally and in the right abdomen. Air within the  transverse colon which is slightly featureless. No evidence of free air. No radiopaque calculi or abnormal soft tissue calcifications. Enteric sutures noted in the right abdomen. No acute osseous abnormalities are seen. IMPRESSION: 1. Air within the transverse colon which is featureless and can be seen in the setting of diarrheal process. No evidence of bowel obstruction. 2. No acute chest findings. Electronically Signed   By: Keith Rake M.D.   On: 05/28/2020 01:41    Pending Labs Unresulted Labs (From admission, onward) Comment          Start     Ordered   06/04/20 0500  Creatinine, serum  (enoxaparin (LOVENOX)    CrCl >/= 30 ml/min)  Weekly,   R     Comments: while on enoxaparin therapy    05/28/20 0315   05/29/20 7989  Basic metabolic panel  Tomorrow morning,   R        05/28/20 0729  05/29/20 0500  Magnesium  Tomorrow morning,   R        05/28/20 0729   05/27/20 2322  Urinalysis, Routine w reflex microscopic  ONCE - STAT,   STAT        05/27/20 2322          Vitals/Pain Today's Vitals   05/28/20 0950 05/28/20 1030 05/28/20 1218 05/28/20 1230  BP:  (!) 109/38 (!) 97/44 (!) 99/36  Pulse:   83 80  Resp:  16 20 17   Temp:      TempSrc:      SpO2:   98% 96%  Weight:      Height:      PainSc: 0-No pain       Isolation Precautions No active isolations  Medications Medications  dorzolamide-timolol (COSOPT) 22.3-6.8 MG/ML ophthalmic solution 1 drop (1 drop Both Eyes Not Given 05/28/20 1243)  enoxaparin (LOVENOX) injection 30 mg (has no administration in time range)  0.9 % NaCl with KCl 20 mEq/ L  infusion ( Intravenous New Bag/Given 05/28/20 1150)  acetaminophen (TYLENOL) tablet 650 mg (has no administration in time range)    Or  acetaminophen (TYLENOL) suppository 650 mg (has no administration in time range)  ondansetron (ZOFRAN) tablet 4 mg (has no administration in time range)    Or  ondansetron (ZOFRAN) injection 4 mg (has no administration in time range)   saccharomyces boulardii (FLORASTOR) capsule 250 mg (250 mg Oral Given 05/28/20 1242)  sodium chloride 0.9 % bolus 1,000 mL ( Intravenous Stopped 05/28/20 0108)  ondansetron (ZOFRAN) injection 4 mg (4 mg Intravenous Given 05/27/20 2357)  ondansetron (ZOFRAN) injection 4 mg (4 mg Intravenous Given 05/28/20 0152)  potassium chloride SA (KLOR-CON) CR tablet 20 mEq (20 mEq Oral Given 05/28/20 0817)    Mobility walks with device Moderate fall risk   Focused Assessments    R Recommendations: See Admitting Provider Note  Report given to:   Additional Notes:

## 2020-05-28 NOTE — ED Provider Notes (Signed)
[facility] EMERGENCY DEPARTMENT Provider Note   CSN: [id number] Arrival date & time: 05/27/20  2314     History Chief Complaint  Patient presents with  . Emesis    Gabrielle [name] is a 74 y.o. female.  Patient here via EMS with 3-day history of vomiting, diarrhea and nausea.  Recently completed her first chemotherapy last week on the 15th.  She developed nausea and vomiting and diarrhea over the past 2 to 3 days.  She estimates 4-5 episodes daily of vomiting and diarrhea.  He cannot tell the color of her vomit or stool due to her poor eyesight.  Does not think it has been bloody.  Denies having any fevers but has felt warm.  Has not checked her temperature.  Sister has similar symptoms but she improved.  Patient with recent diagnosis of colon cancer and had a resection she had her first dose of chemotherapy last week.  EMS reports her blood pressures were in the 60s when she is trying to stand up for them.  She denies any abdominal pain, chest pain, unilateral weakness.  She feels generally weak and lightheaded.  No dizziness or syncope.  The history is provided by the patient and the EMS personnel.  Emesis Associated symptoms: diarrhea   Associated symptoms: no abdominal pain, no cough, no fever and no headaches        Past Medical History:  Diagnosis Date  . Arthritis   . Cancer ([place])   . Cataract    OU  . Colon cancer ([place])   . H/O cesarean section   . Hx of tonsillectomy   . Hyperlipidemia   . Hypertension   . Macular degeneration    Exu ARMD OU    Patient Active Problem List   Diagnosis Date Noted  . Iron deficiency anemia due to chronic blood loss 05/19/2020  . Urinary retention 04/15/2020  . Bladder wall thickening   . Malignant neoplasm of ascending colon ([place])   . Gastrointestinal hemorrhage   . Hypokalemia   . Macular degeneration   . Symptomatic anemia 04/04/2020  . Anemia 08/28/2019  . High risk for hip fracture 06/18/2019  . Osteopenia after  menopause 06/18/2019  . Stage 3 chronic kidney disease 06/18/2019  . Essential hypertension 05/15/2019  . Other hyperlipidemia 05/15/2019    Past Surgical History:  Procedure Laterality Date  . BIOPSY  04/06/2020   Procedure: BIOPSY;  Surgeon: Rogene Houston, MD;  Location: [facility] ENDO SUITE;  Service: Endoscopy;;  . CARPAL TUNNEL RELEASE Right   . CATARACT EXTRACTION W/PHACO Left 10/11/2019   Procedure: CATARACT EXTRACTION PHACO AND INTRAOCULAR LENS PLACEMENT (IOC);  Surgeon: Baruch Goldmann, MD;  Location: [facility] ORS;  Service: Ophthalmology;  Laterality: Left;  CDE: 8.56  . CATARACT EXTRACTION W/PHACO Right 10/25/2019   Procedure: CATARACT EXTRACTION PHACO AND INTRAOCULAR LENS PLACEMENT (IOC);  Surgeon: Baruch Goldmann, MD;  Location: [facility] ORS;  Service: Ophthalmology;  Laterality: Right;  CDE: 5.67  . CESAREAN SECTION    . COLONOSCOPY N/A 04/06/2020   Procedure: COLONOSCOPY;  Surgeon: Rogene Houston, MD;  Location: [facility] ENDO SUITE;  Service: Endoscopy;  Laterality: N/A;  . CYSTOSCOPY WITH BIOPSY N/A 04/08/2020   Procedure: CYSTOSCOPY WITH BIOPSY;  Surgeon: Cleon Gustin, MD;  Location: [facility] ORS;  Service: Urology;  Laterality: N/A;  . ESOPHAGOGASTRODUODENOSCOPY N/A 04/05/2020   Procedure: ESOPHAGOGASTRODUODENOSCOPY (EGD);  Surgeon: Rogene Houston, MD;  Location: [facility] ENDO SUITE;  Service: Endoscopy;  Laterality: N/A;  . PARTIAL COLECTOMY N/A  04/08/2020   Procedure: PARTIAL COLECTOMY;  Surgeon: Virl Cagey, MD;  Location: [facility] ORS;  Service: General;  Laterality: N/A;  . PORTACATH PLACEMENT Left 05/18/2020   Procedure: INSERTION PORT-A-CATH (ATTACHED CATHETER IN LEFT SUBCLAVIAN);  Surgeon: Virl Cagey, MD;  Location: [facility] ORS;  Service: General;  Laterality: Left;  . TONSILLECTOMY       OB History   No obstetric history on file.     Family History  Problem Relation Age of Onset  . Macular degeneration Mother   . Stroke Mother   . Hypertension Mother   . Dementia Mother   . Cancer  Father        lung  . Diabetes Sister   . Hypertension Sister     Social History   Tobacco Use  . Smoking status: Never Smoker  . Smokeless tobacco: Never Used  Vaping Use  . Vaping Use: Never used  Substance Use Topics  . Alcohol use: No  . Drug use: No    Home Medications Prior to Admission medications   Medication Sig Start Date End Date Taking? Authorizing Provider  acetaminophen (TYLENOL) 500 MG tablet Take 1,000 mg by mouth every 6 (six) hours as needed for moderate pain.  Patient not taking: Reported on 05/19/2020    [provider]  alendronate (FOSAMAX) 70 MG tablet Take 1 tablet (70 mg total) by mouth every 7 (seven) days. Take with a full glass of water on an empty stomach. 09/09/19   Janora Norlander, DO  atorvastatin (LIPITOR) 20 MG tablet Take 1 tablet (20 mg total) by mouth daily. 03/20/20   Janora Norlander, DO  cholecalciferol (VITAMIN D3) 25 MCG (1000 UT) tablet Take 1,000 Units by mouth daily.    [provider]  diclofenac Sodium (VOLTAREN) 1 % GEL Apply 4 g topically 4 (four) times daily. 03/30/20   Janora Norlander, DO  dorzolamide-timolol (COSOPT) 22.3-6.8 MG/ML ophthalmic solution Place 1 drop into both eyes daily. 03/30/20   Bernarda Caffey, MD  ferrous sulfate 325 (65 FE) MG EC tablet Take 1 tablet (325 mg total) by mouth 2 (two) times daily with a meal. 06/25/19   Janora Norlander, DO  fluorouracil CALGB 49449 in sodium chloride 0.9 % 150 mL Inject into the vein over 48 hr. 05/19/20   [provider]  FLUOROURACIL IV Inject into the vein every 14 (fourteen) days. 05/19/20   [provider]  HYDROcodone-acetaminophen (NORCO/VICODIN) 5-325 MG tablet Take 1 tablet by mouth every 4 (four) hours as needed for severe pain. Patient not taking: Reported on 05/19/2020 05/18/20 05/18/21  Virl Cagey, MD  LEUCOVORIN CALCIUM IV Inject into the vein every 14 (fourteen) days. 05/19/20   [provider]   lidocaine-prilocaine (EMLA) cream Apply a small amount to port a cath site and cover with plastic wrap 1 hour prior to chemotherapy appointments Patient not taking: Reported on 05/19/2020 05/14/20   Derek Jack, MD  losartan-hydrochlorothiazide (HYZAAR) 100-25 MG tablet TAKE 1 TABLET DAILY.  Needs to be seen for further refills. Patient taking differently: Take 1 tablet by mouth daily.  03/20/20   Janora Norlander, DO  OXALIPLATIN IV Inject into the vein every 14 (fourteen) days. 05/19/20   [provider]  potassium chloride SA (KLOR-CON) 20 MEQ tablet Take 1 tablet (20 mEq total) by mouth daily. 04/21/20   Sharion Balloon, FNP  prochlorperazine (COMPAZINE) 10 MG tablet Take 1 tablet (10 mg total) by mouth every 6 (six) hours  as needed for nausea or vomiting. Patient not taking: Reported on 05/19/2020 05/14/20   Derek Jack, MD    Allergies    Lisinopril  Review of Systems   Review of Systems  Constitutional: Positive for activity change, appetite change and fatigue. Negative for fever.  HENT: Negative for congestion and rhinorrhea.   Respiratory: Negative for cough, chest tightness and shortness of breath.   Cardiovascular: Negative for chest pain.  Gastrointestinal: Positive for diarrhea, nausea and vomiting. Negative for abdominal pain.  Genitourinary: Negative for dysuria and hematuria.  Skin: Negative for wound.  Neurological: Positive for weakness and light-headedness. Negative for dizziness and headaches.   all other systems are negative except as noted in the HPI and PMH.    Physical Exam Updated Vital Signs BP (!) 97/32   Pulse 81   Temp 98.3 F (36.8 C) (Oral)   Resp 13   Ht 5\' 2"  (1.575 m)   Wt 62 kg   SpO2 92%   BMI 25.00 kg/m   Physical Exam Vitals and nursing note reviewed.  Constitutional:      General: She is not in acute distress.    Appearance: She is well-developed. She is ill-appearing.     Comments: Chronically ill-appearing   HENT:     Head: Normocephalic and atraumatic.     Mouth/Throat:     Pharynx: No oropharyngeal exudate.  Eyes:     Conjunctiva/sclera: Conjunctivae normal.     Pupils: Pupils are equal, round, and reactive to light.     Comments: Conjunctival pallor  Neck:     Comments: No meningismus. Cardiovascular:     Rate and Rhythm: Normal rate and regular rhythm.     Heart sounds: Normal heart sounds. No murmur heard.   Pulmonary:     Effort: Pulmonary effort is normal. No respiratory distress.     Breath sounds: Normal breath sounds.  Abdominal:     Palpations: Abdomen is soft.     Tenderness: There is abdominal tenderness. There is no guarding or rebound.     Comments: Soft, mild diffuse tenderness  Musculoskeletal:        General: No tenderness. Normal range of motion.     Cervical back: Normal range of motion and neck supple.  Skin:    General: Skin is warm.  Neurological:     Mental Status: She is alert and oriented to person, place, and time.     Cranial Nerves: No cranial nerve deficit.     Motor: No abnormal muscle tone.     Coordination: Coordination normal.     Comments:  5/5 strength throughout. CN 2-12 intact.Equal grip strength.   Psychiatric:        Behavior: Behavior normal.     ED Results / Procedures / Treatments   Labs (all labs ordered are listed, but only abnormal results are displayed) Labs Reviewed  COMPREHENSIVE METABOLIC PANEL - Abnormal; Notable for the following components:      Result Value   Glucose, Bld 140 (*)    BUN 35 (*)    Creatinine, Ser 2.81 (*)    Calcium 7.5 (*)    Total Protein 5.9 (*)    Albumin 2.7 (*)    AST 12 (*)    GFR calc non Af Amer 16 (*)    GFR calc Af Amer 19 (*)    All other components within normal limits  CBC - Abnormal; Notable for the following components:   WBC 10.6 (*)  RBC 3.24 (*)    Hemoglobin 9.6 (*)    HCT 31.1 (*)    RDW 19.1 (*)    Platelets 116 (*)    All other components within normal limits   TROPONIN I (HIGH SENSITIVITY) - Abnormal; Notable for the following components:   Troponin I (High Sensitivity) 36 (*)    All other components within normal limits  TROPONIN I (HIGH SENSITIVITY) - Abnormal; Notable for the following components:   Troponin I (High Sensitivity) 42 (*)    All other components within normal limits  SARS CORONAVIRUS 2 BY RT PCR (HOSPITAL ORDER, [facility])  LIPASE, BLOOD  LACTIC ACID, PLASMA  URINALYSIS, ROUTINE W REFLEX MICROSCOPIC  POC OCCULT BLOOD, ED    EKG EKG Interpretation  Date/Time:  Wednesday May 27 2020 23:27:14 EDT Ventricular Rate:  86 PR Interval:    QRS Duration: 89 QT Interval:  345 QTC Calculation: 413 R Axis:   71 Text Interpretation: Sinus rhythm Short PR interval Repol abnrm suggests ischemia, diffuse leads progressive T wave inversions and ST depressions Confirmed by Ezequiel Essex [id number]) on 05/27/2020 11:40:34 PM   Radiology DG Abdomen Acute W/Chest  Result Date: 05/28/2020 CLINICAL DATA:  Vomiting and diarrhea. Colon cancer, active chemotherapy. EXAM: DG ABDOMEN ACUTE W/ 1V CHEST COMPARISON:  Chest radiograph 05/18/2020 FINDINGS: Left chest port remains in place with tip in the region of the upper SVC. Unchanged heart size and mediastinal contours. No acute airspace disease, pleural effusion or pneumothorax. No evidence of bowel obstruction. Scattered air throughout nondilated small bowel centrally and in the right abdomen. Air within the transverse colon which is slightly featureless. No evidence of free air. No radiopaque calculi or abnormal soft tissue calcifications. Enteric sutures noted in the right abdomen. No acute osseous abnormalities are seen. IMPRESSION: 1. Air within the transverse colon which is featureless and can be seen in the setting of diarrheal process. No evidence of bowel obstruction. 2. No acute chest findings. Electronically Signed   By: Keith Rake M.D.   On: 05/28/2020 01:41     Procedures Procedures (including critical care time)  Medications Ordered in ED Medications  sodium chloride 0.9 % bolus 1,000 mL (1,000 mLs Intravenous New Bag/Given 05/27/20 2356)  ondansetron (ZOFRAN) injection 4 mg (4 mg Intravenous Given 05/27/20 2357)    ED Course  I have reviewed the triage vital signs and the nursing notes.  Pertinent labs & imaging results that were available during my care of the patient were reviewed by me and considered in my medical decision making (see chart for details).    MDM Rules/Calculators/A&P                         Nausea, vomiting, diarrhea with recent chemotherapy on 6/15 (FOLFOX).  No fever.  Abdomen without peritoneal signs.  EKG shows increasing T wave inversion and ST depression inferiorly and laterally.  Will hydrate.  Check labs.  Labs consistent with AKI  No neutropenia.  Stable anemia.  Patient will be hydrated.  Acute renal failure noted likely due to vomiting and diarrhea.  Abdomen soft without peritoneal signs.  Troponin minimally elevated with worsening T wave inversions on EKG.  She denies chest pain.  Abdominal series negative. Plan admission for IV hydration and correction of her acute kidney injury.  Also need trending of her troponins given her abnormal EKG.  Admission discussed with Dr. Darrick Meigs.  CASSADY STANCZAK was evaluated in Emergency  Department on 05/28/2020 for the symptoms described in the history of present illness. She was evaluated in the context of the global COVID-19 pandemic, which necessitated consideration that the patient might be at risk for infection with the SARS-CoV-2 virus that causes COVID-19. Institutional protocols and algorithms that pertain to the evaluation of patients at risk for COVID-19 are in a state of rapid change based on information released by regulatory bodies including the CDC and federal and state organizations. These policies and algorithms were followed during the patient's care in the  ED.  Final Clinical Impression(s) / ED Diagnoses Final diagnoses:  Nausea vomiting and diarrhea  AKI (acute kidney injury) [facility])    Rx / [facility] Orders ED Discharge Orders    None       Shanteria Laye, Annie Main, MD 05/28/20 [phone]

## 2020-05-28 NOTE — ED Notes (Signed)
Unable to obtain urine specimen at this time as specimen was contaminated with stool.

## 2020-05-29 DIAGNOSIS — E876 Hypokalemia: Secondary | ICD-10-CM

## 2020-05-29 DIAGNOSIS — N1832 Chronic kidney disease, stage 3b: Secondary | ICD-10-CM

## 2020-05-29 LAB — BASIC METABOLIC PANEL
Anion gap: 7 (ref 5–15)
BUN: 28 mg/dL — ABNORMAL HIGH (ref 8–23)
CO2: 21 mmol/L — ABNORMAL LOW (ref 22–32)
Calcium: 7 mg/dL — ABNORMAL LOW (ref 8.9–10.3)
Chloride: 106 mmol/L (ref 98–111)
Creatinine, Ser: 1.64 mg/dL — ABNORMAL HIGH (ref 0.44–1.00)
GFR calc Af Amer: 36 mL/min — ABNORMAL LOW (ref >60)
GFR calc non Af Amer: 31 mL/min — ABNORMAL LOW (ref >60)
Glucose, Bld: 103 mg/dL — ABNORMAL HIGH (ref 70–99)
Potassium: 3.3 mmol/L — ABNORMAL LOW (ref 3.5–5.1)
Sodium: 134 mmol/L — ABNORMAL LOW (ref 135–145)

## 2020-05-29 LAB — MAGNESIUM: Magnesium: 1.6 mg/dL — ABNORMAL LOW (ref 1.7–2.4)

## 2020-05-29 MED ORDER — MAGNESIUM SULFATE IN D5W 1-5 GM/100ML-% IV SOLN
1.0000 g | Freq: Once | INTRAVENOUS | Status: AC
  Administered 2020-05-29: 1 g via INTRAVENOUS
  Filled 2020-05-29: qty 100

## 2020-05-29 NOTE — Progress Notes (Signed)
PROGRESS NOTE    Gabrielle [name]  XAJ:[id number] DOB: [birth year] DOA: 05/27/2020 PCP: Janora Norlander, DO   Chief Complaint  Patient presents with  . Emesis    Brief Narrative: As per H&P written by Dr. Darrick Meigs on 05/28/2020 Gabrielle [name]  is a 74 y.o. female, with history of hypertension, hyperlipidemia, colon cancer, status post right hemicolectomy, currently on chemotherapy.  Patient completed her first chemotherapy last week on 17th.  She developed nausea and vomiting along with diarrhea over past 2 to 3 days.  Patient said that she had 4-5 episodes of diarrhea.  Also had vomiting episodes.  EMS was called, patient's initial blood pressure was in 60s.  No history of dizziness or passing out.  Denies chest pain or shortness of breath. In the ED lab work showed patient had acute kidney injury, her creatinine was 2.81. Baseline creatinine is around 1.12 as of 05/19/2020. Patient started on IV normal saline.  Assessment & Plan: Acute kidney injury on chronic stage IIIb renal failure -In the setting of dehydration, hypotension and continue use of nephrotoxic agents. -Continue fluid resuscitation and electrolyte stability -Continue holding nephrotoxic agents -Follow renal function trend.  Nausea and vomiting and diarrhea: Likely chemotherapy-induced. -Continue as needed antiemetics -Continue fluid resuscitation -Continue slowly advancing diet -Continue the use of Florastor -Patient is afebrile, stable WBCs and denying abdominal pain currently.  Doubt infection component.  Hx of colon cancer -continue outpatient follow up with oncology service.  HLD -resume statins at discharge   DVT prophylaxis: Lovenox Code Status: Full code Family Communication: No family at bedside. Disposition:   Status is: Inpatient  Dispo: The patient is from: home              Anticipated d/c is to: home               Anticipated d/c date is: 6/26              Patient currently Still not  medically ready for discharge she continued to be intermittently nauseated, small episode of vomiting this morning and having trouble tolerating diet.  Renal function improved after fluid resuscitation and electrolytes more stable.  Continue current management.       Consultants:  None   Procedures:  See below for x-ray reports   Antimicrobials:  None   Subjective: Afebrile, no chest pain, still feeling intermittent nausea and having 1 small episode of vomiting earlier today.  Willing to have diet further advance and overall feeling better.  Objective: Vitals:   05/29/20 1400 05/29/20 1500 05/29/20 1600 05/29/20 1700  BP: (!) 135/44 (!) 147/55 (!) 130/42 (!) 149/62  Pulse: 66 68 70 70  Resp: 14 17 (!) 21 17  Temp:      TempSrc:      SpO2: 100% 100% 100% 100%  Weight:      Height:        Intake/Output Summary (Last 24 hours) at 05/29/2020 1803 Last data filed at 05/29/2020 1600 Gross per 24 hour  Intake 240 ml  Output --  Net 240 ml   Filed Weights   05/27/20 2320 05/28/20 1418 05/28/20 1422  Weight: 62 kg 61 kg 61 kg    Examination: General exam: Reports feeling improved; still experiencing some nausea and a small episode of vomiting earlier this morning.  Trying to eat full liquid diet and further advance later today.  No abdominal pain.  Still having some loose stools. Respiratory system: Clear to auscultation. Respiratory effort normal.  Cardiovascular system: Rate controlled.  No rubs, no gallops, no JVD. Gastrointestinal system: Abdomen is nondistended, soft and nontender. No organomegaly or masses felt. Normal bowel sounds heard. Central nervous system: Alert and oriented. No focal neurological deficits. Extremities: No cyanosis or clubbing. Skin: No rashes, no petechiae. Psychiatry: Judgement and insight appear normal. Mood & affect appropriate.     Data Reviewed: I have personally reviewed following labs and imaging studies  CBC: Recent Labs  Lab  05/27/20 2355 05/28/20 0402  WBC 10.6* 9.4  HGB 9.6* 9.2*  HCT 31.1* 30.2*  MCV 96.0 95.6  PLT 116* 130*    Basic Metabolic Panel: Recent Labs  Lab 05/27/20 2355 05/28/20 0402 05/29/20 0413  NA 135 136 134*  K 3.5 3.0* 3.3*  CL 98 101 106  CO2 25 23 21*  GLUCOSE 140* 129* 103*  BUN 35* 37* 28*  CREATININE 2.81* 2.65* 1.64*  CALCIUM 7.5* 7.4* 7.0*  MG  --   --  1.6*    GFR: Estimated Creatinine Clearance: 26.3 mL/min (A) (by C-G formula based on SCr of 1.64 mg/dL (H)).  Liver Function Tests: Recent Labs  Lab 05/27/20 2355 05/28/20 0402  AST 12* 12*  ALT 7 8  ALKPHOS 59 56  BILITOT 1.0 0.7  PROT 5.9* 5.8*  ALBUMIN 2.7* 2.6*    CBG: No results for input(s): GLUCAP in the last 168 hours.   Recent Results (from the past 240 hour(s))  SARS Coronavirus 2 by RT PCR (hospital order, performed in [facility] hospital lab) Nasopharyngeal Nasopharyngeal Swab     Status: None   Collection Time: 05/28/20  1:57 AM   Specimen: Nasopharyngeal Swab  Result Value Ref Range Status   SARS Coronavirus 2 NEGATIVE NEGATIVE Final    Comment: (NOTE) SARS-CoV-2 target nucleic acids are NOT DETECTED.  The SARS-CoV-2 RNA is generally detectable in upper and lower respiratory specimens during the acute phase of infection. The lowest concentration of SARS-CoV-2 viral copies this assay can detect is 250 copies / mL. A negative result does not preclude SARS-CoV-2 infection and should not be used as the sole basis for treatment or other patient management decisions.  A negative result may occur with improper specimen collection / handling, submission of specimen other than nasopharyngeal swab, presence of viral mutation(s) within the areas targeted by this assay, and inadequate number of viral copies (<250 copies / mL). A negative result must be combined with clinical observations, patient history, and epidemiological information.  Fact Sheet for Patients:    StrictlyIdeas.no  Fact Sheet for Healthcare Providers: BankingDealers.co.za  This test is not yet approved or  cleared by the [place] FDA and has been authorized for detection and/or diagnosis of SARS-CoV-2 by FDA under an Emergency Use Authorization (EUA).  This EUA will remain in effect (meaning this test can be used) for the duration of the COVID-19 declaration under Section 564(b)(1) of the Act, 21 U.S.C. section 360bbb-3(b)(1), unless the authorization is terminated or revoked sooner.  Performed at [facility], [address], [place], [place] [address]   MRSA PCR Screening     Status: None   Collection Time: 05/28/20  2:32 PM   Specimen: Nasal Mucosa; Nasopharyngeal  Result Value Ref Range Status   MRSA by PCR NEGATIVE NEGATIVE Final    Comment:        The GeneXpert MRSA Assay (FDA approved for NASAL specimens only), is one component of a comprehensive MRSA colonization surveillance program. It is not intended to diagnose MRSA  infection nor to guide or monitor treatment for MRSA infections. Performed at [facility], [address], [place], [place] [address]       Radiology Studies: DG Abdomen Acute W/Chest  Result Date: 05/28/2020 CLINICAL DATA:  Vomiting and diarrhea. Colon cancer, active chemotherapy. EXAM: DG ABDOMEN ACUTE W/ 1V CHEST COMPARISON:  Chest radiograph 05/18/2020 FINDINGS: Left chest port remains in place with tip in the region of the upper SVC. Unchanged heart size and mediastinal contours. No acute airspace disease, pleural effusion or pneumothorax. No evidence of bowel obstruction. Scattered air throughout nondilated small bowel centrally and in the right abdomen. Air within the transverse colon which is slightly featureless. No evidence of free air. No radiopaque calculi or abnormal soft tissue calcifications. Enteric sutures noted in the right abdomen. No acute osseous abnormalities are seen.  IMPRESSION: 1. Air within the transverse colon which is featureless and can be seen in the setting of diarrheal process. No evidence of bowel obstruction. 2. No acute chest findings. Electronically Signed   By: Keith Rake M.D.   On: 05/28/2020 01:41     Scheduled Meds: . Chlorhexidine Gluconate Cloth  6 each Topical Daily  . dorzolamide-timolol  1 drop Both Eyes Daily  . enoxaparin (LOVENOX) injection  30 mg Subcutaneous Q24H  . saccharomyces boulardii  250 mg Oral BID   Continuous Infusions: . 0.9 % NaCl with KCl 20 mEq / L 100 mL/hr at 05/29/20 0729     LOS: 1 day    Time spent: 30 minutes    Barton Dubois, MD [facility]   To contact the attending provider between 7A-7P or the covering provider during after hours 7P-7A, please log into the web site [email or web address] and access using universal [place] password for that web site. If you do not have the password, please call the hospital operator.  05/29/2020, 6:03 PM

## 2020-05-29 NOTE — Care Management Important Message (Signed)
Important Message  Patient Details  Name: Gabrielle [name] MRN: [id number] Date of Birth: Jul 16, 1946   Medicare Important Message Given:  Yes     Tommy Medal 05/29/2020, 3:36 PM

## 2020-05-30 DIAGNOSIS — D6481 Anemia due to antineoplastic chemotherapy: Secondary | ICD-10-CM

## 2020-05-30 DIAGNOSIS — C189 Malignant neoplasm of colon, unspecified: Secondary | ICD-10-CM

## 2020-05-30 DIAGNOSIS — T451X5A Adverse effect of antineoplastic and immunosuppressive drugs, initial encounter: Secondary | ICD-10-CM

## 2020-05-30 DIAGNOSIS — I10 Essential (primary) hypertension: Secondary | ICD-10-CM

## 2020-05-30 DIAGNOSIS — R112 Nausea with vomiting, unspecified: Secondary | ICD-10-CM

## 2020-05-30 LAB — CBC
HCT: 30.1 % — ABNORMAL LOW (ref 36.0–46.0)
Hemoglobin: 9.1 g/dL — ABNORMAL LOW (ref 12.0–15.0)
MCH: 29.1 pg (ref 26.0–34.0)
MCHC: 30.2 g/dL (ref 30.0–36.0)
MCV: 96.2 fL (ref 80.0–100.0)
Platelets: 128 10*3/uL — ABNORMAL LOW (ref 150–400)
RBC: 3.13 MIL/uL — ABNORMAL LOW (ref 3.87–5.11)
RDW: 19.1 % — ABNORMAL HIGH (ref 11.5–15.5)
WBC: 2.5 10*3/uL — ABNORMAL LOW (ref 4.0–10.5)
nRBC: 0 % (ref 0.0–0.2)

## 2020-05-30 LAB — BASIC METABOLIC PANEL
Anion gap: 10 (ref 5–15)
BUN: 20 mg/dL (ref 8–23)
CO2: 18 mmol/L — ABNORMAL LOW (ref 22–32)
Calcium: 8.3 mg/dL — ABNORMAL LOW (ref 8.9–10.3)
Chloride: 109 mmol/L (ref 98–111)
Creatinine, Ser: 1.2 mg/dL — ABNORMAL HIGH (ref 0.44–1.00)
GFR calc Af Amer: 52 mL/min — ABNORMAL LOW (ref >60)
GFR calc non Af Amer: 45 mL/min — ABNORMAL LOW (ref >60)
Glucose, Bld: 115 mg/dL — ABNORMAL HIGH (ref 70–99)
Potassium: 3.3 mmol/L — ABNORMAL LOW (ref 3.5–5.1)
Sodium: 137 mmol/L (ref 135–145)

## 2020-05-30 LAB — MAGNESIUM: Magnesium: 1.9 mg/dL (ref 1.7–2.4)

## 2020-05-30 MED ORDER — ENOXAPARIN SODIUM 40 MG/0.4ML ~~LOC~~ SOLN: 40.0000 mg | SUBCUTANEOUS | Status: DC

## 2020-05-30 MED ORDER — AMLODIPINE BESYLATE 5 MG PO TABS
5.0000 mg | ORAL_TABLET | Freq: Every day | ORAL | 1 refills | Status: DC
Start: 2020-05-30 — End: 2020-07-24

## 2020-05-30 MED ORDER — PROCHLORPERAZINE MALEATE 10 MG PO TABS: 10.0000 mg | ORAL_TABLET | Freq: Four times a day (QID) | ORAL | 0 refills | Status: DC | PRN

## 2020-05-30 MED ORDER — DIPHENOXYLATE-ATROPINE 2.5-0.025 MG PO TABS
1.0000 | ORAL_TABLET | Freq: Three times a day (TID) | ORAL | 1 refills | Status: DC | PRN
Start: 2020-05-30 — End: 2020-06-05

## 2020-05-30 NOTE — Discharge Summary (Signed)
Physician Discharge Summary  Gabrielle [name] BOF:[id number] DOB: 07/24/1946 DOA: 05/27/2020  PCP: Gabrielle Norlander, DO  Admit date: 05/27/2020 Discharge date: 05/30/2020  Time spent: 35 minutes  Recommendations for Outpatient Follow-up:  1. Repeat CBC to follow hemoglobin, platelets WBCs count 2. -Repeat basic metabolic panel to evaluate lites renal function 3. Reassess blood pressure and further adjust antihypertensive regimen as required   Discharge Diagnoses:  Active Problems:   Malignant neoplasm of colon ([place])   Acute kidney injury ([place])   Acute on chronic renal failure ([facility])   Nausea vomiting and diarrhea   Discharge Condition: Stable and improved.  Patient discharged home with instruction to follow-up with PCP in 10 days.  CODE STATUS: Full code.  Diet recommendation: Heart healthy diet.  Filed Weights   05/28/20 1418 05/28/20 1422 05/30/20 0349  Weight: 61 kg 61 kg 66.1 kg    History of present illness:  As per H&P written by Dr. Darrick [name] on 05/28/2020 [name],with history of hypertension, hyperlipidemia, colon cancer, status post right hemicolectomy, currently on chemotherapy.Patient completed her first chemotherapy last week on 17th. She developed nausea and vomiting along with diarrhea over past 2 to 3 days. Patient said that she had 4-5 episodes of diarrhea. Also had vomiting episodes. EMS was called, patient's initial blood pressure was in 60s. No history of dizziness or passing out. Denies chest pain or shortness of breath. In the ED lab work showed patient had acute kidney injury, her creatinine was 2.81. Baseline creatinine is around 1.12 as of 05/19/2020. Patient started on IV normal saline.  Hospital Course:  Acute kidney injury on chronic stage IIIb renal failure -In the setting of dehydration, hypotension and continue use of nephrotoxic agents. -Patient advised to maintain adequate hydration. -Continue holding nephrotoxic  agents -Follow renal function trend with repeat basic metabolic panel follow-up visit.Marland Kitchen  Hypokalemia -Continue potassium supplementation. -Repeat basic metabolic panel to follow electrolytes and renal function.  Hypertension -Losartan/HCTZ will continue to be on hold until follow-up with PCP to assure complete resolution of her renal function. -Low-dose amlodipine already basis for blood pressure control will be initiated -Patient advised to follow heart healthy diet.  Nausea and vomiting and diarrhea: Likely chemotherapy-induced. -Continue as needed antiemetics -Advised to maintain adequate nutrition and hydration. -Soft diet with increased fiber intake. -Discharged on as needed Lomotil to assist with ongoing diarrhea. -Patient is afebrile, stable WBCs and denying abdominal pain currently.  Doubt infection component.  Hx of colon cancer -continue outpatient follow up with oncology service. -Currently receiving active chemotherapy  HLD -resume statins at discharge -Heart healthy diet recommended.  Chemotherapy-induced pancytopenia -Stable -No need for transfusion -Continue to follow cell count trend -No overt bleeding Appreciated  Procedures: See below for x-ray reports.  Consultations:  None  Discharge Exam: Vitals:   05/30/20 1000 05/30/20 1300  BP: (!) 154/53   Pulse: 72   Resp: 14   Temp:  97.9 F (36.6 C)  SpO2: 94%     General: Afebrile, no chest pain, no further episode of nausea or vomiting.  Still with some intermittent loose stools, but reported no abdominal pain and tolerating soft diet.  Feels ready to go home Cardiovascular: S1 and S2, no rubs, no gallops, no JVD. Respiratory: Clear to auscultation bilaterally. Abdomen: Soft, nontender, distended, positive bowel sounds Extremities: No cyanosis or clubbing. Skin: No open wounds, heel mid abdomen incision from recent partial right colectomy.  Has healed adequately, no further wound  care.  Discharge Instructions  Discharge Instructions    Diet - low sodium heart healthy   Complete by: As directed    Discharge instructions   Complete by: As directed    Maintain adequate hydration Take medications are prescribed Follow-up with PCP in 10 days Soft diet recommended paying attention to fiber intake and sodium content.   Increase activity slowly   Complete by: As directed    No wound care   Complete by: As directed      Allergies as of 05/30/2020      Reactions   Lisinopril Cough      Medication List    STOP taking these medications   diclofenac Sodium 1 % Gel Commonly known as: VOLTAREN   HYDROcodone-acetaminophen 5-325 MG tablet Commonly known as: NORCO/VICODIN   losartan-hydrochlorothiazide 100-25 MG tablet Commonly known as: HYZAAR     TAKE these medications   acetaminophen 500 MG tablet Commonly known as: TYLENOL Take 1,000 mg by mouth every 6 (six) hours as needed for moderate pain.   alendronate 70 MG tablet Commonly known as: FOSAMAX Take 1 tablet (70 mg total) by mouth every 7 (seven) days. Take with a full glass of water on an empty stomach.   amLODipine 5 MG tablet Commonly known as: NORVASC Take 1 tablet (5 mg total) by mouth daily.   atorvastatin 20 MG tablet Commonly known as: LIPITOR Take 1 tablet (20 mg total) by mouth daily.   cholecalciferol 25 MCG (1000 UNIT) tablet Commonly known as: VITAMIN D3 Take 1,000 Units by mouth daily.   diphenoxylate-atropine 2.5-0.025 MG tablet Commonly known as: Lomotil Take 1 tablet by mouth every 8 (eight) hours as needed for diarrhea or loose stools.   dorzolamide-timolol 22.3-6.8 MG/ML ophthalmic solution Commonly known as: COSOPT Place 1 drop into both eyes daily.   ferrous sulfate 325 (65 FE) MG EC tablet Take 1 tablet (325 mg total) by mouth 2 (two) times daily with a meal.   fluorouracil CALGB 25427 in sodium chloride 0.9 % 150 mL Inject into the vein over 48 hr.    FLUOROURACIL IV Inject into the vein every 14 (fourteen) days.   LEUCOVORIN CALCIUM IV Inject into the vein every 14 (fourteen) days.   lidocaine-prilocaine cream Commonly known as: EMLA Apply a small amount to port a cath site and cover with plastic wrap 1 hour prior to chemotherapy appointments   OXALIPLATIN IV Inject into the vein every 14 (fourteen) days.   potassium chloride SA 20 MEQ tablet Commonly known as: KLOR-CON Take 1 tablet (20 mEq total) by mouth daily.   prochlorperazine 10 MG tablet Commonly known as: COMPAZINE Take 1 tablet (10 mg total) by mouth every 6 (six) hours as needed for nausea or vomiting.      Allergies  Allergen Reactions  . Lisinopril Cough    Follow-up Information    Gabrielle Norlander, DO. Schedule an appointment as soon as possible for a visit in 10 day(s).   Specialty: Family Medicine Contact information: [place] [address] [phone]               The results of significant diagnostics from this hospitalization (including imaging, microbiology, ancillary and laboratory) are listed below for reference.    Significant Diagnostic Studies: DG Chest 1 View  Result Date: 05/18/2020 CLINICAL DATA:  Port-A-Cath placement.  Colon carcinoma. EXAM: CHEST  1 VIEW COMPARISON:  PET-CT May 12, 2020 FINDINGS: Port-A-Cath tip is in the superior vena cava. No pneumothorax. There is mild bibasilar  atelectasis. Lungs otherwise are clear. Heart size and pulmonary vascularity are normal. No adenopathy. There is degenerative change in the thoracic spine. There is aortic atherosclerosis. IMPRESSION: Port-A-Cath tip in superior vena cava. No pneumothorax. Slight bibasilar atelectasis. Lungs otherwise clear. Heart size normal. Aortic Atherosclerosis (ICD10-I70.0). Electronically Signed   By: Lowella Grip III M.D.   On: 05/18/2020 09:26   NM PET Image Initial (PI) Skull Base To Thigh  Result Date: 05/12/2020 CLINICAL DATA:  Initial  treatment strategy for colon cancer. EXAM: NUCLEAR MEDICINE PET SKULL BASE TO THIGH TECHNIQUE: 7.27 mCi F-18 FDG was injected intravenously. Full-ring PET imaging was performed from the skull base to thigh after the radiotracer. CT data was obtained and used for attenuation correction and anatomic localization. Fasting blood glucose: 85 mg/dl COMPARISON:  04/06/2020 FINDINGS: Mediastinal blood pool activity: SUV max 2.81 Liver activity: SUV max NA NECK: No hypermetabolic lymph nodes in the neck. Incidental CT findings: none CHEST: No hypermetabolic mediastinal or hilar nodes. 4 mm LEFT lower lobe pulmonary nodule 4 mm (image 101, series 3) area not imaged on previous assessment. Area not associated with increased metabolic activity there with limited evaluation given the size. Additional nodule at the LEFT lung base measuring approximately 6 mm (image 108, series 3) and a third nodule at the RIGHT lower lobe approximately 6 mm uptake below blood pool, remaining nonspecific. Incidental CT findings: Calcified coronary artery disease. Mild cardiac enlargement. Mitral annular calcification. Extensive calcified atheromatous plaque of the thoracic aorta. Limited assessment of heart and great vessels in the chest without contrast. No signs of consolidation or evidence of pleural effusion in the chest. ABDOMEN/PELVIS: Focus of increased FDG uptake along the anastomotic suture line of the ileocolonic anastomosis following RIGHT hemicolectomy. Mild thickening of the colon in this location (image 162, series 3) 9 mm. (SUVmax = 5.9 mild increased FDG uptake along the midline of the abdomen associated with postoperative changes. No focal, suspicious uptake in the liver or within abdominal lymph nodes. Diffuse bladder wall thickening associated with moderate FDG uptake. Some of the uptake within the bladder is likely excreted FDG in the urine in.) Incidental CT findings: Small cysts in the RIGHT kidney. No hydronephrosis. No  nephrolithiasis. Post RIGHT hemicolectomy as described. Mild peri-anastomotic stranding without focal fluid or sign of free air. Thickening along the posterior margin of the anastomotic site. See above. SKELETON: No focal hypermetabolic activity to suggest skeletal metastasis. Focus of increased FDG uptake associated with supraspinatus muscular or tendinous activity about the LEFT shoulder no focal abnormality on CT images. Incidental CT findings: none IMPRESSION: 1. Post RIGHT hemicolectomy with mild FDG uptake associated with the anastomotic site. This may represent mild edema or inflammation, attention on follow-up. 2. Small pulmonary nodules that do not show elevated FDG activity, remain nonspecific. Suggest dedicated chest CT for further assessment. Metastatic disease is considered but there are no priors for comparison. Comparison with prior imaging could also be beneficial if available. 3. Increased FDG uptake associated with supraspinatus about the LEFT shoulder likely related to tendinopathy given lack of CT correlate. Correlate with symptoms. This could potentially relate as well to the recent COVID vaccination in the LEFT arm, attention on follow-up. 4. Persistent but improved bladder wall thickening. No debris in the urinary bladder lumen with mild diffuse uptake remains suggestive of cystitis. Electronically Signed   By: Zetta Bills M.D.   On: 05/12/2020 12:34   DG Chest Port 1 View  Result Date: 05/18/2020 CLINICAL DATA:  Left Port-A-Cath placement.  EXAM: PORTABLE CHEST 1 VIEW COMPARISON:  PET 05/12/2020. FINDINGS: Patient is slightly rotated. Trachea is midline. Heart is enlarged. Thoracic aorta is calcified. Left-sided power port tip projects in the region of the SVC. Difficult to exclude posterior course, within the azygos vein. Lungs are somewhat low in volume with linear atelectasis or scarring at the lung bases. No pleural fluid. No pneumothorax. IMPRESSION: 1. Left-sided power port  placement with tip in the region of the SVC. Difficult to exclude posterior course, within the azygos vein. Lateral view would be helpful. No pneumothorax. 2. Minimal bibasilar subsegmental atelectasis/scarring. 3.  Aortic atherosclerosis (ICD10-I70.0). Electronically Signed   By: Lorin Picket M.D.   On: 05/18/2020 08:48   DG Abdomen Acute W/Chest  Result Date: 05/28/2020 CLINICAL DATA:  Vomiting and diarrhea. Colon cancer, active chemotherapy. EXAM: DG ABDOMEN ACUTE W/ 1V CHEST COMPARISON:  Chest radiograph 05/18/2020 FINDINGS: Left chest port remains in place with tip in the region of the upper SVC. Unchanged heart size and mediastinal contours. No acute airspace disease, pleural effusion or pneumothorax. No evidence of bowel obstruction. Scattered air throughout nondilated small bowel centrally and in the right abdomen. Air within the transverse colon which is slightly featureless. No evidence of free air. No radiopaque calculi or abnormal soft tissue calcifications. Enteric sutures noted in the right abdomen. No acute osseous abnormalities are seen. IMPRESSION: 1. Air within the transverse colon which is featureless and can be seen in the setting of diarrheal process. No evidence of bowel obstruction. 2. No acute chest findings. Electronically Signed   By: Keith Rake M.D.   On: 05/28/2020 01:41   DG C-Arm 1-60 Min-No Report  Result Date: 05/18/2020 Fluoroscopy was utilized by the requesting physician.  No radiographic interpretation.    Microbiology: Recent Results (from the past 240 hour(s))  SARS Coronavirus 2 by RT PCR (hospital order, performed in [facility] hospital lab) Nasopharyngeal Nasopharyngeal Swab     Status: None   Collection Time: 05/28/20  1:57 AM   Specimen: Nasopharyngeal Swab  Result Value Ref Range Status   SARS Coronavirus 2 NEGATIVE NEGATIVE Final    Comment: (NOTE) SARS-CoV-2 target nucleic acids are NOT DETECTED.  The SARS-CoV-2 RNA is generally detectable  in upper and lower respiratory specimens during the acute phase of infection. The lowest concentration of SARS-CoV-2 viral copies this assay can detect is 250 copies / mL. A negative result does not preclude SARS-CoV-2 infection and should not be used as the sole basis for treatment or other patient management decisions.  A negative result may occur with improper specimen collection / handling, submission of specimen other than nasopharyngeal swab, presence of viral mutation(s) within the areas targeted by this assay, and inadequate number of viral copies (<250 copies / mL). A negative result must be combined with clinical observations, patient history, and epidemiological information.  Fact Sheet for Patients:   StrictlyIdeas.no  Fact Sheet for Healthcare Providers: BankingDealers.co.za  This test is not yet approved or  cleared by the [place] FDA and has been authorized for detection and/or diagnosis of SARS-CoV-2 by FDA under an Emergency Use Authorization (EUA).  This EUA will remain in effect (meaning this test can be used) for the duration of the COVID-19 declaration under Section 564(b)(1) of the Act, 21 U.S.C. section 360bbb-3(b)(1), unless the authorization is terminated or revoked sooner.  Performed at [facility], [address] [place], [place] [address]   MRSA PCR Screening     Status: None   Collection Time:  05/28/20  2:32 PM   Specimen: Nasal Mucosa; Nasopharyngeal  Result Value Ref Range Status   MRSA by PCR NEGATIVE NEGATIVE Final    Comment:        The GeneXpert MRSA Assay (FDA approved for NASAL specimens only), is one component of a comprehensive MRSA colonization surveillance program. It is not intended to diagnose MRSA infection nor to guide or monitor treatment for MRSA infections. Performed at [facility], [address], [place],  [address]      Labs: Basic Metabolic Panel: Recent  Labs  Lab 05/27/20 2355 05/28/20 0402 05/29/20 0413 05/30/20 0347  NA 135 136 134* 137  K 3.5 3.0* 3.3* 3.3*  CL 98 101 106 109  CO2 25 23 21* 18*  GLUCOSE 140* 129* 103* 115*  BUN 35* 37* 28* 20  CREATININE 2.81* 2.65* 1.64* 1.20*  CALCIUM 7.5* 7.4* 7.0* 8.3*  MG  --   --  1.6* 1.9   Liver Function Tests: Recent Labs  Lab 05/27/20 2355 05/28/20 0402  AST 12* 12*  ALT 7 8  ALKPHOS 59 56  BILITOT 1.0 0.7  PROT 5.9* 5.8*  ALBUMIN 2.7* 2.6*   Recent Labs  Lab 05/27/20 2355  LIPASE 19    CBC: Recent Labs  Lab 05/27/20 2355 05/28/20 0402 05/30/20 0347  WBC 10.6* 9.4 2.5*  HGB 9.6* 9.2* 9.1*  HCT 31.1* 30.2* 30.1*  MCV 96.0 95.6 96.2  PLT 116* 130* 128*    Signed:  Barton Dubois MD.  [facility] 05/30/2020, 1:42 PM

## 2020-05-30 NOTE — Progress Notes (Signed)
Patient is to be discharged home and in stable condition. Patient's IV and telemetry removed, WNL. Patient given discharge instructions and verbalized understanding. All questions addressed and answered. Patient to be escorted out by staff via wheelchair upon daughter's arrival.  Kaydon Creedon P Dishmon, RN  

## 2020-06-01 ENCOUNTER — Telehealth: Payer: Self-pay

## 2020-06-01 NOTE — Telephone Encounter (Signed)
TRANSITIONAL CARE MANAGEMENT TELEPHONE OUTREACH NOTE   Contact Date: 06/01/2020 Contacted By: Nigel Berthold   DISCHARGE INFORMATION Date of Discharge:05/30/2020 Discharge Facility: [facility] Discharge Diagnosis:AKI, nausea with vomiting, malignant neoplasm of colon   Outpatient Follow Up Recommendations (copied from discharge summary) Recommendations for Outpatient Follow-up:  1. Repeat CBC to follow hemoglobin, platelets WBCs count 2. -Repeat basic metabolic panel to evaluate lites renal function 3. Reassess blood pressure and further adjust antihypertensive regimen as required   Discharge Diagnoses:  Active Problems:   Malignant neoplasm of colon ([place])   Acute kidney injury ([place])   Acute on chronic renal failure ([facility])   Nausea vomiting and diarrhea   Discharge Condition: Stable and improved.  Patient discharged home with instruction to follow-up with PCP in 10 days.  LACRECIA DELVAL is a female primary care patient of Janora Norlander, DO. An outgoing telephone call was made today and I spoke with daughter- Ashok Norris.  Ms. Devries condition(s) and treatment(s) were discussed. An opportunity to ask questions was provided and all were answered or forwarded as appropriate.    ACTIVITIES OF DAILY LIVING  AARILYN DYE lives with their family and she can perform ADLs independently. her primary caregiver is daughterAshok Norris. she is able to depend on her primary caregiver(s) for consistent help. Transportation to appointments, to pick up medications, and to run errands is not a problem.  (Consider referral to [facility] if transportation or a consistent caregiver is a problem)   Fall Risk Fall Risk  04/21/2020 08/02/2018  Falls in the past year? 0 No  Number falls in past yr: 0 -  Injury with Fall? 0 -    medium [place] Modifications/Assistive Devices Wheelchair: No Cane: No Ramp: No Bedside Toilet: No Hospital Bed:  No Other: walker   [place] she is not receiving home health.   MEDICATION RECONCILIATION  Ms. Schwanz has been able to pick-up all prescribed discharge medications from the pharmacy.   A post discharge medication reconciliation was performed and the complete medication list was reviewed with the patient/caregiver and is current as of 06/01/2020. Changes highlighted below.  Discontinued Medications none  Current Medication List Allergies as of 06/01/2020      Reactions   Lisinopril Cough      Medication List       Accurate as of June 01, 2020  1:49 PM. If you have any questions, ask your nurse or doctor.        acetaminophen 500 MG tablet Commonly known as: TYLENOL Take 1,000 mg by mouth every 6 (six) hours as needed for moderate pain.   alendronate 70 MG tablet Commonly known as: FOSAMAX Take 1 tablet (70 mg total) by mouth every 7 (seven) days. Take with a full glass of water on an empty stomach.   amLODipine 5 MG tablet Commonly known as: NORVASC Take 1 tablet (5 mg total) by mouth daily.   atorvastatin 20 MG tablet Commonly known as: LIPITOR Take 1 tablet (20 mg total) by mouth daily.   cholecalciferol 25 MCG (1000 UNIT) tablet Commonly known as: VITAMIN D3 Take 1,000 Units by mouth daily.   diphenoxylate-atropine 2.5-0.025 MG tablet Commonly known as: Lomotil Take 1 tablet by mouth every 8 (eight) hours as needed for diarrhea or loose stools.   dorzolamide-timolol 22.3-6.8 MG/ML ophthalmic solution Commonly known as: COSOPT Place 1 drop into both eyes daily.   ferrous sulfate 325 (65 FE) MG EC tablet  Take 1 tablet (325 mg total) by mouth 2 (two) times daily with a meal.   fluorouracil CALGB 75301 in sodium chloride 0.9 % 150 mL Inject into the vein over 48 hr.   FLUOROURACIL IV Inject into the vein every 14 (fourteen) days.   LEUCOVORIN CALCIUM IV Inject into the vein every 14 (fourteen) days.   lidocaine-prilocaine cream Commonly known as: EMLA Apply a small  amount to port a cath site and cover with plastic wrap 1 hour prior to chemotherapy appointments   OXALIPLATIN IV Inject into the vein every 14 (fourteen) days.   potassium chloride SA 20 MEQ tablet Commonly known as: KLOR-CON Take 1 tablet (20 mEq total) by mouth daily.   prochlorperazine 10 MG tablet Commonly known as: COMPAZINE Take 1 tablet (10 mg total) by mouth every 6 (six) hours as needed for nausea or vomiting.        PATIENT EDUCATION & FOLLOW-UP PLAN  An appointment for Transitional Care Management has not been scheduled.  Daughter states that patient is too weak for a visit.  States she has apt with oncology tomorrow and she will call us back if she thinks she can schedule a tele phone visit.   Take all medications as prescribed  Contact our office by calling [phone] if you have any questions or concerns

## 2020-06-02 ENCOUNTER — Inpatient Hospital Stay (HOSPITAL_COMMUNITY)
Admission: EM | Admit: 2020-06-02 | Discharge: 2020-06-05 | DRG: 372 | Disposition: A | Discharge status: Home Health | Payer: Medicare HMO | Attending: Internal Medicine | Admitting: Internal Medicine

## 2020-06-02 ENCOUNTER — Telehealth (HOSPITAL_COMMUNITY): Payer: Self-pay

## 2020-06-02 ENCOUNTER — Inpatient Hospital Stay (HOSPITAL_COMMUNITY): Payer: Medicare HMO

## 2020-06-02 ENCOUNTER — Encounter (HOSPITAL_COMMUNITY): Payer: Self-pay | Admitting: Emergency Medicine

## 2020-06-02 ENCOUNTER — Encounter (HOSPITAL_COMMUNITY): Payer: Self-pay

## 2020-06-02 ENCOUNTER — Emergency Department (HOSPITAL_COMMUNITY): Payer: Medicare HMO

## 2020-06-02 ENCOUNTER — Other Ambulatory Visit: Payer: Self-pay

## 2020-06-02 ENCOUNTER — Inpatient Hospital Stay (HOSPITAL_COMMUNITY): Payer: Medicare HMO | Admitting: Oncology

## 2020-06-02 DIAGNOSIS — K6389 Other specified diseases of intestine: Secondary | ICD-10-CM | POA: Diagnosis not present

## 2020-06-02 DIAGNOSIS — Z8249 Family history of ischemic heart disease and other diseases of the circulatory system: Secondary | ICD-10-CM

## 2020-06-02 DIAGNOSIS — Z79899 Other long term (current) drug therapy: Secondary | ICD-10-CM

## 2020-06-02 DIAGNOSIS — Z9049 Acquired absence of other specified parts of digestive tract: Secondary | ICD-10-CM

## 2020-06-02 DIAGNOSIS — D509 Iron deficiency anemia, unspecified: Secondary | ICD-10-CM

## 2020-06-02 DIAGNOSIS — E46 Unspecified protein-calorie malnutrition: Secondary | ICD-10-CM | POA: Diagnosis not present

## 2020-06-02 DIAGNOSIS — Z888 Allergy status to other drugs, medicaments and biological substances status: Secondary | ICD-10-CM

## 2020-06-02 DIAGNOSIS — C189 Malignant neoplasm of colon, unspecified: Secondary | ICD-10-CM | POA: Diagnosis not present

## 2020-06-02 DIAGNOSIS — N183 Chronic kidney disease, stage 3 unspecified: Secondary | ICD-10-CM | POA: Diagnosis present

## 2020-06-02 DIAGNOSIS — Z9841 Cataract extraction status, right eye: Secondary | ICD-10-CM | POA: Diagnosis not present

## 2020-06-02 DIAGNOSIS — N1832 Chronic kidney disease, stage 3b: Secondary | ICD-10-CM | POA: Diagnosis present

## 2020-06-02 DIAGNOSIS — Z66 Do not resuscitate: Secondary | ICD-10-CM | POA: Diagnosis present

## 2020-06-02 DIAGNOSIS — I358 Other nonrheumatic aortic valve disorders: Secondary | ICD-10-CM | POA: Diagnosis not present

## 2020-06-02 DIAGNOSIS — A0472 Enterocolitis due to Clostridium difficile, not specified as recurrent: Secondary | ICD-10-CM | POA: Diagnosis present

## 2020-06-02 DIAGNOSIS — E876 Hypokalemia: Secondary | ICD-10-CM | POA: Diagnosis present

## 2020-06-02 DIAGNOSIS — Z823 Family history of stroke: Secondary | ICD-10-CM | POA: Diagnosis not present

## 2020-06-02 DIAGNOSIS — R140 Abdominal distension (gaseous): Secondary | ICD-10-CM | POA: Diagnosis not present

## 2020-06-02 DIAGNOSIS — Z801 Family history of malignant neoplasm of trachea, bronchus and lung: Secondary | ICD-10-CM

## 2020-06-02 DIAGNOSIS — I10 Essential (primary) hypertension: Secondary | ICD-10-CM | POA: Diagnosis not present

## 2020-06-02 DIAGNOSIS — D500 Iron deficiency anemia secondary to blood loss (chronic): Secondary | ICD-10-CM | POA: Diagnosis not present

## 2020-06-02 DIAGNOSIS — Z20822 Contact with and (suspected) exposure to covid-19: Secondary | ICD-10-CM | POA: Diagnosis present

## 2020-06-02 DIAGNOSIS — K56600 Partial intestinal obstruction, unspecified as to cause: Secondary | ICD-10-CM | POA: Diagnosis not present

## 2020-06-02 DIAGNOSIS — Z6826 Body mass index (BMI) 26.0-26.9, adult: Secondary | ICD-10-CM

## 2020-06-02 DIAGNOSIS — Z7983 Long term (current) use of bisphosphonates: Secondary | ICD-10-CM

## 2020-06-02 DIAGNOSIS — N179 Acute kidney failure, unspecified: Secondary | ICD-10-CM | POA: Diagnosis not present

## 2020-06-02 DIAGNOSIS — C182 Malignant neoplasm of ascending colon: Secondary | ICD-10-CM | POA: Diagnosis not present

## 2020-06-02 DIAGNOSIS — N3289 Other specified disorders of bladder: Secondary | ICD-10-CM | POA: Diagnosis not present

## 2020-06-02 DIAGNOSIS — E785 Hyperlipidemia, unspecified: Secondary | ICD-10-CM | POA: Diagnosis present

## 2020-06-02 DIAGNOSIS — N1831 Chronic kidney disease, stage 3a: Secondary | ICD-10-CM | POA: Diagnosis present

## 2020-06-02 DIAGNOSIS — D6481 Anemia due to antineoplastic chemotherapy: Secondary | ICD-10-CM | POA: Diagnosis present

## 2020-06-02 DIAGNOSIS — K567 Ileus, unspecified: Secondary | ICD-10-CM | POA: Diagnosis not present

## 2020-06-02 DIAGNOSIS — D6489 Other specified anemias: Secondary | ICD-10-CM | POA: Diagnosis present

## 2020-06-02 DIAGNOSIS — Z9842 Cataract extraction status, left eye: Secondary | ICD-10-CM

## 2020-06-02 DIAGNOSIS — R197 Diarrhea, unspecified: Secondary | ICD-10-CM

## 2020-06-02 DIAGNOSIS — N189 Chronic kidney disease, unspecified: Secondary | ICD-10-CM | POA: Diagnosis present

## 2020-06-02 DIAGNOSIS — H3530 Unspecified macular degeneration: Secondary | ICD-10-CM | POA: Diagnosis present

## 2020-06-02 DIAGNOSIS — Y929 Unspecified place or not applicable: Secondary | ICD-10-CM | POA: Diagnosis not present

## 2020-06-02 DIAGNOSIS — D649 Anemia, unspecified: Secondary | ICD-10-CM | POA: Diagnosis present

## 2020-06-02 DIAGNOSIS — T451X5A Adverse effect of antineoplastic and immunosuppressive drugs, initial encounter: Secondary | ICD-10-CM | POA: Diagnosis present

## 2020-06-02 DIAGNOSIS — Z833 Family history of diabetes mellitus: Secondary | ICD-10-CM

## 2020-06-02 DIAGNOSIS — N289 Disorder of kidney and ureter, unspecified: Secondary | ICD-10-CM | POA: Diagnosis not present

## 2020-06-02 DIAGNOSIS — J9811 Atelectasis: Secondary | ICD-10-CM | POA: Diagnosis not present

## 2020-06-02 DIAGNOSIS — K521 Toxic gastroenteritis and colitis: Secondary | ICD-10-CM | POA: Diagnosis present

## 2020-06-02 DIAGNOSIS — Z961 Presence of intraocular lens: Secondary | ICD-10-CM | POA: Diagnosis present

## 2020-06-02 DIAGNOSIS — I129 Hypertensive chronic kidney disease with stage 1 through stage 4 chronic kidney disease, or unspecified chronic kidney disease: Secondary | ICD-10-CM | POA: Diagnosis present

## 2020-06-02 LAB — MAGNESIUM: Magnesium: 1.8 mg/dL (ref 1.7–2.4)

## 2020-06-02 LAB — URINALYSIS, ROUTINE W REFLEX MICROSCOPIC
Bilirubin Urine: NEGATIVE
Glucose, UA: NEGATIVE mg/dL
Ketones, ur: NEGATIVE mg/dL
Nitrite: NEGATIVE
Protein, ur: 30 mg/dL — AB
Specific Gravity, Urine: 1.012 (ref 1.005–1.030)
WBC, UA: >50 WBC/hpf — ABNORMAL HIGH (ref 0–5)
pH: 5 (ref 5.0–8.0)

## 2020-06-02 LAB — CBC WITH DIFFERENTIAL/PLATELET
Abs Immature Granulocytes: 0.22 10*3/uL — ABNORMAL HIGH (ref 0.00–0.07)
Basophils Absolute: 0 10*3/uL (ref 0.0–0.1)
Basophils Relative: 0 %
Eosinophils Absolute: 0 10*3/uL (ref 0.0–0.5)
Eosinophils Relative: 0 %
HCT: 30.2 % — ABNORMAL LOW (ref 36.0–46.0)
Hemoglobin: 9.2 g/dL — ABNORMAL LOW (ref 12.0–15.0)
Immature Granulocytes: 2 %
Lymphocytes Relative: 5 %
Lymphs Abs: 0.5 10*3/uL — ABNORMAL LOW (ref 0.7–4.0)
MCH: 29.1 pg (ref 26.0–34.0)
MCHC: 30.5 g/dL (ref 30.0–36.0)
MCV: 95.6 fL (ref 80.0–100.0)
Monocytes Absolute: 1.2 10*3/uL — ABNORMAL HIGH (ref 0.1–1.0)
Monocytes Relative: 11 %
Neutro Abs: 8.5 10*3/uL — ABNORMAL HIGH (ref 1.7–7.7)
Neutrophils Relative %: 82 %
Platelets: 206 10*3/uL (ref 150–400)
RBC: 3.16 MIL/uL — ABNORMAL LOW (ref 3.87–5.11)
RDW: 19 % — ABNORMAL HIGH (ref 11.5–15.5)
WBC: 10.4 10*3/uL (ref 4.0–10.5)
nRBC: 0.2 % (ref 0.0–0.2)

## 2020-06-02 LAB — COMPREHENSIVE METABOLIC PANEL
ALT: 9 U/L (ref 0–44)
AST: 12 U/L — ABNORMAL LOW (ref 15–41)
Albumin: 2.5 g/dL — ABNORMAL LOW (ref 3.5–5.0)
Alkaline Phosphatase: 85 U/L (ref 38–126)
Anion gap: 11 (ref 5–15)
BUN: 24 mg/dL — ABNORMAL HIGH (ref 8–23)
CO2: 21 mmol/L — ABNORMAL LOW (ref 22–32)
Calcium: 8.4 mg/dL — ABNORMAL LOW (ref 8.9–10.3)
Chloride: 106 mmol/L (ref 98–111)
Creatinine, Ser: 1.85 mg/dL — ABNORMAL HIGH (ref 0.44–1.00)
GFR calc Af Amer: 31 mL/min — ABNORMAL LOW (ref >60)
GFR calc non Af Amer: 27 mL/min — ABNORMAL LOW (ref >60)
Glucose, Bld: 116 mg/dL — ABNORMAL HIGH (ref 70–99)
Potassium: 2.3 mmol/L — CL (ref 3.5–5.1)
Sodium: 138 mmol/L (ref 135–145)
Total Bilirubin: 0.4 mg/dL (ref 0.3–1.2)
Total Protein: 5.7 g/dL — ABNORMAL LOW (ref 6.5–8.1)

## 2020-06-02 LAB — C DIFFICILE QUICK SCREEN W PCR REFLEX
C Diff antigen: POSITIVE — AB
C Diff interpretation: DETECTED
C Diff toxin: POSITIVE — AB

## 2020-06-02 LAB — SARS CORONAVIRUS 2 BY RT PCR (HOSPITAL ORDER, PERFORMED IN ~~LOC~~ HOSPITAL LAB): SARS Coronavirus 2: NEGATIVE

## 2020-06-02 MED ORDER — METRONIDAZOLE IN NACL 5-0.79 MG/ML-% IV SOLN
500.0000 mg | Freq: Three times a day (TID) | INTRAVENOUS | Status: DC
  Administered 2020-06-02 – 2020-06-05 (×9): 500 mg via INTRAVENOUS
  Filled 2020-06-02 (×9): qty 100

## 2020-06-02 MED ORDER — AMLODIPINE BESYLATE 5 MG PO TABS
5.0000 mg | ORAL_TABLET | Freq: Every day | ORAL | Status: DC
  Administered 2020-06-03 – 2020-06-05 (×3): 5 mg via ORAL
  Filled 2020-06-02 (×3): qty 1

## 2020-06-02 MED ORDER — FERROUS SULFATE 325 (65 FE) MG PO TABS
325.0000 mg | ORAL_TABLET | Freq: Every day | ORAL | Status: DC
  Administered 2020-06-03: 325 mg via ORAL
  Filled 2020-06-02: qty 1

## 2020-06-02 MED ORDER — POTASSIUM CHLORIDE IN NACL 20-0.9 MEQ/L-% IV SOLN
INTRAVENOUS | Status: DC
  Filled 2020-06-02: qty 1000

## 2020-06-02 MED ORDER — PROCHLORPERAZINE EDISYLATE 10 MG/2ML IJ SOLN: 10.0000 mg | INTRAMUSCULAR | Status: DC | PRN

## 2020-06-02 MED ORDER — ACETAMINOPHEN 325 MG PO TABS
650.0000 mg | ORAL_TABLET | Freq: Four times a day (QID) | ORAL | Status: DC | PRN
  Administered 2020-06-02 – 2020-06-03 (×2): 650 mg via ORAL
  Filled 2020-06-02 (×2): qty 2

## 2020-06-02 MED ORDER — FENTANYL CITRATE (PF) 100 MCG/2ML IJ SOLN: 12.5000 ug | INTRAMUSCULAR | Status: DC | PRN

## 2020-06-02 MED ORDER — ONDANSETRON HCL 4 MG/2ML IJ SOLN: 4.0000 mg | Freq: Four times a day (QID) | INTRAMUSCULAR | Status: DC | PRN

## 2020-06-02 MED ORDER — DORZOLAMIDE HCL-TIMOLOL MAL 2-0.5 % OP SOLN
1.0000 [drp] | Freq: Every day | OPHTHALMIC | Status: DC
  Administered 2020-06-03 – 2020-06-05 (×3): 1 [drp] via OPHTHALMIC
  Filled 2020-06-02: qty 10

## 2020-06-02 MED ORDER — VITAMIN D 25 MCG (1000 UNIT) PO TABS
1000.0000 [IU] | ORAL_TABLET | Freq: Every day | ORAL | Status: DC
  Administered 2020-06-03 – 2020-06-05 (×3): 1000 [IU] via ORAL
  Filled 2020-06-02 (×3): qty 1

## 2020-06-02 MED ORDER — POTASSIUM CHLORIDE 10 MEQ/100ML IV SOLN
10.0000 meq | INTRAVENOUS | Status: AC
  Administered 2020-06-02 (×3): 10 meq via INTRAVENOUS
  Filled 2020-06-02 (×3): qty 100

## 2020-06-02 MED ORDER — ATORVASTATIN CALCIUM 20 MG PO TABS
20.0000 mg | ORAL_TABLET | Freq: Every evening | ORAL | Status: DC
  Administered 2020-06-03 – 2020-06-04 (×2): 20 mg via ORAL
  Filled 2020-06-02 (×2): qty 1

## 2020-06-02 MED ORDER — SACCHAROMYCES BOULARDII 250 MG PO CAPS
250.0000 mg | ORAL_CAPSULE | Freq: Two times a day (BID) | ORAL | Status: DC
  Administered 2020-06-02 – 2020-06-05 (×6): 250 mg via ORAL
  Filled 2020-06-02 (×6): qty 1

## 2020-06-02 MED ORDER — ACETAMINOPHEN 650 MG RE SUPP: 650.0000 mg | Freq: Four times a day (QID) | RECTAL | Status: DC | PRN

## 2020-06-02 MED ORDER — PANTOPRAZOLE SODIUM 40 MG PO TBEC
40.0000 mg | DELAYED_RELEASE_TABLET | Freq: Every day | ORAL | Status: DC
  Administered 2020-06-03 – 2020-06-05 (×3): 40 mg via ORAL
  Filled 2020-06-02 (×3): qty 1

## 2020-06-02 MED ORDER — TRAZODONE HCL 50 MG PO TABS: 25.0000 mg | ORAL_TABLET | Freq: Every evening | ORAL | Status: DC | PRN

## 2020-06-02 MED ORDER — ENOXAPARIN SODIUM 40 MG/0.4ML ~~LOC~~ SOLN
40.0000 mg | SUBCUTANEOUS | Status: DC
  Administered 2020-06-02: 40 mg via SUBCUTANEOUS
  Filled 2020-06-02: qty 0.4

## 2020-06-02 MED ORDER — OXYCODONE HCL 5 MG PO TABS: 5.0000 mg | ORAL_TABLET | ORAL | Status: DC | PRN

## 2020-06-02 MED ORDER — DICLOFENAC SODIUM 1 % EX GEL
2.0000 g | Freq: Four times a day (QID) | CUTANEOUS | Status: DC | PRN
  Filled 2020-06-02: qty 100

## 2020-06-02 MED ORDER — METOPROLOL TARTRATE 5 MG/5ML IV SOLN: 5.0000 mg | Freq: Four times a day (QID) | INTRAVENOUS | Status: DC | PRN

## 2020-06-02 MED ORDER — VANCOMYCIN 50 MG/ML ORAL SOLUTION
125.0000 mg | Freq: Four times a day (QID) | ORAL | Status: DC
  Administered 2020-06-02 – 2020-06-05 (×13): 125 mg via ORAL
  Filled 2020-06-02 (×29): qty 2.5

## 2020-06-02 NOTE — H&P (Signed)
History and Physical  [place] GUY:[id number] DOB: Feb 09, 1946 DOA: 06/02/2020  PCP: Janora Norlander, DO  Patient coming from: Home   I have personally briefly reviewed patient's old medical records in [place]  Chief Complaint: diarrhea  HPI: Gabrielle [name] is a 74 y.o. 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  She was hospitalized earlier this month and discharge a few days ago after being admitted for nausea vomiting and diarrhea that was thought likely chemotherapy-induced.  She is status post right hemicolectomy.  She had an acute kidney injury during last admission.  She returned to the emergency department today because she continues to have nonbloody diarrhea.  The frequency has increased.  She had 5 episodes of diarrhea yesterday and continues to have uncontrolled diarrhea.  She also has nausea and vomiting.  She has some abdominal distention but no significant abdominal pain.  She has been taking Lomotil with no improvement in her diarrhea.  Her last known antibiotic was given on 6/14 prior to Port-A-Cath placement where she received a dose of cefazolin.  Her other past medical history as detailed below.    ED Course: Patient arrived afebrile appearing chronically ill and in mild distress blood pressure 131/55 pulse 87 pulse ox 100% on room air, weight 66.1 kg, labs revealed sodium 138, potassium 2.3, glucose 116, creatinine 1.85, WBC 10.4, hemoglobin 9.2, platelet count 206.  Urinalysis reveals protein small hemoglobin moderate leukocytes greater than 50 WBC per high-power field and 21-50 squamous epithelial cells.  C. difficile antigen and toxin positive.  SARS 2 coronavirus test pending. Acute abdominal series with findings of New slightly distended small bowel loops in the mid abdomen. This could represent ileus or partial small bowel  obstruction.  CT scan with findings of diffuse bowel wall thickening and findings of C. Difficile.  Patient is being admitted for further management.  Review of Systems: As per HPI otherwise 10 point review of systems negative.   Past Medical History:  Diagnosis Date  . Arthritis   . Cancer ([place])   . Cataract    OU  . Colon cancer ([place])   . H/O cesarean section   . Hx of tonsillectomy   . Hyperlipidemia   . Hypertension   . Macular degeneration    Exu ARMD OU    Past Surgical History:  Procedure Laterality Date  . BIOPSY  04/06/2020   Procedure: BIOPSY;  Surgeon: Rogene Houston, MD;  Location: [facility];  Service: Endoscopy;;  . CARPAL TUNNEL RELEASE Right   . CATARACT EXTRACTION W/PHACO Left 10/11/2019   Procedure: CATARACT EXTRACTION PHACO AND INTRAOCULAR LENS PLACEMENT (IOC);  Surgeon: Baruch Goldmann, MD;  Location: [facility] ORS;  Service: Ophthalmology;  Laterality: Left;  CDE: 8.56  . CATARACT EXTRACTION W/PHACO Right 10/25/2019   Procedure: CATARACT EXTRACTION PHACO AND INTRAOCULAR LENS PLACEMENT (IOC);  Surgeon: Baruch Goldmann, MD;  Location: [facility] OR[facility];  Service: Ophthalmology;  Laterality: Right;  CDE: 5.67  . CESAREAN SECTION    . COLONOSCOPY N/A 04/06/2020   Procedure: COLONOSCOPY;  Surgeon: Rogene Houston, MD;  Location: [facility];  Service: Endoscopy;  Laterality: N/A;  . CYSTOSCOPY WITH BIOPSY N/A 04/08/2020   Procedure: CYSTOSCOPY WITH BIOPSY;  Surgeon: Cleon Gustin, MD;  Location: [facility];  Service: Urology;  Laterality: N/A;  . ESOPHAGOGASTRODUODENOSCOPY N/A 04/05/2020   Procedure: ESOPHAGOGASTRODUODENOSCOPY (EGD);  Surgeon: Hildred Laser  U, MD;  Location: [facility];  Service: Endoscopy;  Laterality: N/A;  . PARTIAL COLECTOMY N/A 04/08/2020   Procedure: PARTIAL COLECTOMY;  Surgeon: Virl Cagey, MD;  Location: [facility];  Service: General;  Laterality: N/A;  . PORTACATH PLACEMENT Left 05/18/2020   Procedure: INSERTION PORT-A-CATH (ATTACHED CATHETER IN LEFT  SUBCLAVIAN);  Surgeon: Virl Cagey, MD;  Location: [facility];  Service: General;  Laterality: Left;  . TONSILLECTOMY       reports that she has never smoked. She has never used smokeless tobacco. She reports that she does not drink alcohol and does not use drugs.  Allergies  Allergen Reactions  . Lisinopril Cough    Family History  Problem Relation Age of Onset  . Macular degeneration Mother   . Stroke Mother   . Hypertension Mother   . Dementia Mother   . Cancer Father        lung  . Diabetes Sister   . Hypertension Sister      Prior to Admission medications   Medication Sig Start Date End Date Taking? Authorizing Provider  alendronate (FOSAMAX) 70 MG tablet Take 1 tablet (70 mg total) by mouth every 7 (seven) days. Take with a full glass of water on an empty stomach. 09/09/19  Yes Gottschalk, Leatrice Jewels M, DO  amLODipine (NORVASC) 5 MG tablet Take 1 tablet (5 mg total) by mouth daily. 05/30/20 05/30/21 Yes Barton Dubois, MD  atorvastatin (LIPITOR) 20 MG tablet Take 1 tablet (20 mg total) by mouth daily. 03/20/20  Yes Ronnie Doss M, DO  cholecalciferol (VITAMIN D3) 25 MCG (1000 UT) tablet Take 1,000 Units by mouth daily.   Yes [provider]  diclofenac Sodium (VOLTAREN) 1 % GEL Apply 2 g topically 4 (four) times daily.   Yes [provider]  diphenoxylate-atropine (LOMOTIL) 2.5-0.025 MG tablet Take 1 tablet by mouth every 8 (eight) hours as needed for diarrhea or loose stools. 05/30/20 05/30/21 Yes Barton Dubois, MD  dorzolamide-timolol (COSOPT) 22.3-6.8 MG/ML ophthalmic solution Place 1 drop into both eyes daily. 03/30/20  Yes Bernarda Caffey, MD  ferrous sulfate 325 (65 FE) MG EC tablet Take 1 tablet (325 mg total) by mouth 2 (two) times daily with a meal. 06/25/19  Yes Ronnie Doss M, DO  fluorouracil CALGB 65681 in sodium chloride 0.9 % 150 mL Inject into the vein over 48 hr. 05/19/20  Yes [provider]  FLUOROURACIL IV Inject into the vein  every 14 (fourteen) days. 05/19/20  Yes [provider]  LEUCOVORIN CALCIUM IV Inject into the vein every 14 (fourteen) days. 05/19/20  Yes [provider]  potassium chloride SA (KLOR-CON) 20 MEQ tablet Take 1 tablet (20 mEq total) by mouth daily. 04/21/20  Yes Hawks, Christy A, FNP  prochlorperazine (COMPAZINE) 10 MG tablet Take 1 tablet (10 mg total) by mouth every 6 (six) hours as needed for nausea or vomiting. 05/30/20  Yes Barton Dubois, MD  acetaminophen (TYLENOL) 500 MG tablet Take 1,000 mg by mouth every 6 (six) hours as needed for moderate pain.  Patient not taking: Reported on 05/19/2020    [provider]  lidocaine-prilocaine (EMLA) cream Apply a small amount to port a cath site and cover with plastic wrap 1 hour prior to chemotherapy appointments Patient not taking: Reported on 05/19/2020 05/14/20   Derek Jack, MD  OXALIPLATIN IV Inject into the vein every 14 (fourteen) days. 05/19/20   [provider]    Physical Exam: Vitals:   06/02/20 1058 06/02/20  1108 06/02/20 1158 06/02/20 1228  BP: (!) 117/47  (!) 122/47 (!) 140/40  Pulse: 80 75 79 82  Resp: 16 16 13 18   Temp:      TempSrc:      SpO2: 100% 99% 95% 97%  Weight:      Height:        Constitutional: NAD, calm, comfortable Eyes: PERRL, lids and conjunctivae normal ENMT: Mucous membranes are dry.  Posterior pharynx clear of any exudate or lesions.Normal dentition.  Neck: normal, supple, no masses, no thyromegaly Respiratory: clear to auscultation bilaterally, no wheezing, no crackles. Normal respiratory effort. No accessory muscle use.  Cardiovascular: Regular rate and rhythm, no murmurs / rubs / gallops. No extremity edema. 2+ pedal pulses. No carotid bruits.  Abdomen: mild distension, mild generalized tenderness, no masses palpated. No hepatosplenomegaly. Bowel sounds positive.  Musculoskeletal: no clubbing / cyanosis. No joint deformity upper and lower extremities. Good ROM, no  contractures. Normal muscle tone.  Skin: no rashes, lesions, ulcers. No induration Neurologic: CN 2-12 grossly intact. Sensation intact, DTR normal. Strength 5/5 in all 4.  Psychiatric: Normal judgment and insight. Alert and oriented x 3. Normal mood.   Labs on Admission: I have personally reviewed following labs and imaging studies  CBC: Recent Labs  Lab 05/27/20 2355 05/28/20 0402 05/30/20 0347 06/02/20 0933  WBC 10.6* 9.4 2.5* 10.4  NEUTROABS  --   --   --  8.5*  HGB 9.6* 9.2* 9.1* 9.2*  HCT 31.1* 30.2* 30.1* 30.2*  MCV 96.0 95.6 96.2 95.6  PLT 116* 130* 128* 250   Basic Metabolic Panel: Recent Labs  Lab 05/27/20 2355 05/28/20 0402 05/29/20 0413 05/30/20 0347 06/02/20 0933  NA 135 136 134* 137 138  K 3.5 3.0* 3.3* 3.3* 2.3*  CL 98 101 106 109 106  CO2 25 23 21* 18* 21*  GLUCOSE 140* 129* 103* 115* 116*  BUN 35* 37* 28* 20 24*  CREATININE 2.81* 2.65* 1.64* 1.20* 1.85*  CALCIUM 7.5* 7.4* 7.0* 8.3* 8.4*  MG  --   --  1.6* 1.9  --    GFR: Estimated Creatinine Clearance: 24.2 mL/min (A) (by C-G formula based on SCr of 1.85 mg/dL (H)). Liver Function Tests: Recent Labs  Lab 05/27/20 2355 05/28/20 0402 06/02/20 0933  AST 12* 12* 12*  ALT 7 8 9   ALKPHOS 59 56 85  BILITOT 1.0 0.7 0.4  PROT 5.9* 5.8* 5.7*  ALBUMIN 2.7* 2.6* 2.5*   Recent Labs  Lab 05/27/20 2355  LIPASE 19   No results for input(s): AMMONIA in the last 168 hours. Coagulation Profile: No results for input(s): INR, PROTIME in the last 168 hours. Cardiac Enzymes: No results for input(s): CKTOTAL, CKMB, CKMBINDEX, TROPONINI in the last 168 hours. BNP (last 3 results) No results for input(s): PROBNP in the last 8760 hours. HbA1C: No results for input(s): HGBA1C in the last 72 hours. CBG: No results for input(s): GLUCAP in the last 168 hours. Lipid Profile: No results for input(s): CHOL, HDL, LDLCALC, TRIG, CHOLHDL, LDLDIRECT in the last 72 hours. Thyroid Function Tests: No results for  input(s): TSH, T4TOTAL, FREET4, T3FREE, THYROIDAB in the last 72 hours. Anemia Panel: No results for input(s): VITAMINB12, FOLATE, FERRITIN, TIBC, IRON, RETICCTPCT in the last 72 hours. Urine analysis:    Component Value Date/Time   COLORURINE YELLOW 06/02/2020 1125   APPEARANCEUR CLOUDY (A) 06/02/2020 1125   LABSPEC 1.012 06/02/2020 1125   PHURINE 5.0 06/02/2020 1125   GLUCOSEU NEGATIVE 06/02/2020 1125  HGBUR SMALL (A) 06/02/2020 1125   BILIRUBINUR NEGATIVE 06/02/2020 1125   [place] 06/02/2020 1125   PROTEINUR 30 (A) 06/02/2020 1125   NITRITE NEGATIVE 06/02/2020 1125   LEUKOCYTESUR MODERATE (A) 06/02/2020 1125    Radiological Exams on Admission: CT ABDOMEN PELVIS WO CONTRAST  Result Date: 06/02/2020 CLINICAL DATA:  Vomiting and diarrhea. Ascending colon cancer, prior right hemicolectomy. EXAM: CT ABDOMEN AND PELVIS WITHOUT CONTRAST TECHNIQUE: Multidetector CT imaging of the abdomen and pelvis was performed following the standard protocol without IV contrast. COMPARISON:  PET-CT 05/11/2020 and CT abdomen from 04/06/2020 FINDINGS: Lower chest: Descending thoracic aortic atherosclerotic calcification. Mild mitral and aortic valve calcification. Low-density blood pool raise the possibility of anemia. Small right and trace left pleural effusion. Mild cardiomegaly. Bandlike density in the left lower lobe favoring atelectasis. Left lower lobe nodule 0.5 cm in diameter on image 14/4, stable. Subtle nodularity medially in the right lower lobe on images 29-34 of series 4, nonspecific. There is some faint geographic ground-glass opacities in the lower lobes Hepatobiliary: Borderline dilated gallbladder. No significant extrahepatic biliary dilatation. No obvious noncontrast CT abnormality of the hepatic parenchyma. Pancreas: Unremarkable Spleen: Unremarkable Adrenals/Urinary Tract: Both adrenal glands appear normal. Hyperdense peripheral 6 mm lesion of the right kidney upper pole on image 33/2  is stable and probably a complex cyst but technically nonspecific. Other hypodense bilateral renal lesions are probably cysts. No hydronephrosis or hydroureter. There is diffuse urinary bladder wall thickening which could reflect cystitis but was also present on the prior PET-CT from 05/11/2020. Stomach/Bowel: Right hemicolectomy with diffuse wall thickening in the remaining colon especially the descending colon and sigmoid colon, with air-fluid levels. There is some mildly dilated loops of small bowel in the abdomen along with some additional borderline dilated loops. A few small segments of small-bowel are not particularly dilated. There scattered air-fluid levels in the small bowel loops no specific lead point for obstruction identified. No pneumatosis or compelling findings of breakdown of the coloenteric anastomotic site in the right abdomen. Vascular/Lymphatic: Aortoiliac atherosclerotic vascular disease. No pathologic adenopathy. Reproductive: Unremarkable Other: New mild ascites, most notable along the paracolic gutters and in the perihepatic and perisplenic regions. New mesenteric edema. Musculoskeletal: Thoracic and lumbar spondylosis. Mid and lower lumbar degenerative disc disease. IMPRESSION: 1. Diffuse wall thickening in the remaining colon especially the descending colon and sigmoid colon, with air-fluid levels. There is some mildly dilated loops of small bowel in the abdomen along with scattered air-fluid levels. No specific lead point for obstruction identified. Appearance suggests enterocolitis potentially with ileus. Given the thickening in the distal colon, consider testing for C difficile toxin. Strictly speaking on today's noncontrast examination I cannot exclude ischemia, but no pneumatosis is observed. 2. New mild ascites and mesenteric edema. 3. Small right and trace left pleural effusions. 4. Low-density blood pool raise the possibility of anemia. 5. Mild cardiomegaly. Mild mitral and aortic  valve calcification. 6. Stable 0.5 cm in diameter left lower lobe pulmonary nodule, nonspecific. 7. Hyperdense peripheral 6 mm lesion of the right kidney upper pole is stable and probably a complex cyst but technically nonspecific. Other hypodense bilateral renal lesions are probably cysts. 8. Subtle nodularity medially in the right lower lobe on images 29-34 of series 4, nonspecific. 9. Diffuse urinary bladder wall thickening could reflect cystitis but was also present on the prior PET-CT from 05/11/2020. 10. Aortic atherosclerosis. Aortic Atherosclerosis (ICD10-I70.0). Electronically Signed   By: Van Clines M.D.   On: 06/02/2020 12:49   DG ABD  ACUTE 2+V W 1V CHEST  Result Date: 06/02/2020 CLINICAL DATA:  Abdominal distention.  Diarrhea. EXAM: DG ABDOMEN ACUTE W/ 1V CHEST COMPARISON:  Radiographs dated 05/28/2020 FINDINGS: The heart size and pulmonary vascularity are normal. Power port tip in the superior vena cava, unchanged. Minimal linear atelectasis at the left lung base. The lungs are otherwise clear. There are multiple new slightly distended small bowel loops in the mid abdomen. There is air scattered throughout the nondistended colon. The stomach is not distended. No visible free air or free fluid.  No acute bone abnormality. IMPRESSION: New slightly distended small bowel loops in the mid abdomen. This could represent ileus or partial small bowel obstruction. Electronically Signed   By: Lorriane Shire M.D.   On: 06/02/2020 11:06   Assessment/Plan Principal Problem:   Enterocolitis due to Clostridioides difficile Active Problems:   Essential hypertension   Stage 3 chronic kidney disease   Anemia   Macular degeneration   Malignant neoplasm of colon (HCC)   Hypokalemia   Bladder wall thickening   Iron deficiency anemia due to chronic blood loss   Acute kidney injury ([place])   DNR (do not resuscitate)   Diarrhea associated with pseudomembranous colitis  1. Severe C difficile  enterocolitis - with concern for partial small bowel obstruction - treating for severe disease with oral vancomcyin and IV metronidazole.  Continue supportive care with IV fluids. Bowel rest today.  NPO except sips.  2. Ileus / Partial SBO - bowel rest, supportive care, C diff treatment.   3. Essential hypertension - resume home meds, follow.  4. Hypokalemia - check magnesium, IV replacement ordered, add K to IV fluids.  Follow CMP.  5. Bladder wall thickening - chronic finding, no signs or symptoms of cystitis.   6. AKI on stage 3b CKD - continue IV fluid hydration.  7. Iron deficiency anemia - continue iron supplement per heme/onc Dr. Delton Coombes.  8. Diarrhea associated with C diff - hold lomotil for now, treat C diff aggressively.  9. Stage IIIb poorly differentiated right colon adenocarcinoma - Pt recently had port placed and started on chemotherapy with plans to give every 14 days.  Follow up with Dr. Delton Coombes. 10. DNR present on admission - continue in hospital.    DVT prophylaxis: enoxaparin/SCD  Code Status: DNR   Family Communication: updated at bedside   Disposition Plan: TBD  Consults called:   Admission status: INP   Nalia Honeycutt MD [facility] How to contact the [facility] Attending or Consulting provider [place] or covering provider during after hours [place], for this patient?  1. Check the care team in [facility] and look for a) attending/consulting [facility] provider listed and b) the [facility] team listed 2. Log into [email or web address] and use [place]'s universal password to access. If you do not have the password, please contact the hospital operator. 3. Locate the [facility] provider you are looking for under [facility] and page to a number that you can be directly reached. 4. If you still have difficulty reaching the provider, please page the [facility] (Director on Call) for the Hospitalists listed on amion for assistance.  If 7PM-7AM, please contact  night-coverage [email or web address] Password [email or web address]  06/02/2020, 1:33 PM

## 2020-06-02 NOTE — ED Notes (Signed)
Pt vomited green bile like substance after taking vancocin

## 2020-06-02 NOTE — ED Notes (Signed)
Initial infusion stopped at 1213. Second infusion administered 1235 per chart.

## 2020-06-02 NOTE — Telephone Encounter (Signed)
Patient's daughter called from home to inform RN that she is taking her mother to the ER. Patient was discharged from ICU on 05/30/20. The patient's daughter states, " I can't get her out of the bathroom long enough to get her dressed to bring her to you all." Patient vomiting with diarrhea per daughter's words. Daughter states the vomiting is new and started today. The daughter states, " We have already gone through three depends and I may call EMS to [place] and get her. Instructed the daughter to take her to the ER. The daughter states she wants her to be readmitted and her mother was discharged with these symptoms since Saturday.

## 2020-06-02 NOTE — ED Notes (Signed)
Pt had moderate amount of green/mucous/ watery stool. Specimen obtained

## 2020-06-02 NOTE — ED Notes (Signed)
CRITICAL VALUE ALERT  Critical Value:  C. Difficile Toxin  Date & Time Notied: 06/02/20 1207  Provider Notified: Dr Langston Masker  Orders Received/Actions taken: No orders given

## 2020-06-02 NOTE — ED Provider Notes (Signed)
[facility] Provider Note   CSN: [id number] Arrival date & time: 06/02/20  6433     History Chief Complaint  Patient presents with  . Diarrhea    Gabrielle [name] is a 74 y.o. female with a past medical history as outlined below, significant for hypertension, hyperlipidemia, and a new diagnosis of colon cancer under the care of Dr. Delton Coombes, received her first chemotherapy treatment of FOLFOX 2 weeks ago, scheduled for her second treatment today, also with a right hemicolectomy performed on Apr 07, 2020 was discharged from the hospital 3 days ago secondary to uncontrolled diarrhea which she continues to have.  She reports 5 episodes of nonbloody diarrhea yesterday, again this morning along with nausea and vomiting.  She has had no fevers or chills.  She does report abdominal distention, denies significant abdominal pain, but feels sore across her lower abdomen.  She has been taking Lomotil which has not improved her diarrhea.  She last had IV abx, cefazolin when she had her portacath placed on 6/14.    The history is provided by the patient.       Past Medical History:  Diagnosis Date  . Arthritis   . Cancer ([place])   . Cataract    OU  . Colon cancer ([place])   . H/O cesarean section   . Hx of tonsillectomy   . Hyperlipidemia   . Hypertension   . Macular degeneration    Exu ARMD OU    Patient Active Problem List   Diagnosis Date Noted  . Nausea vomiting and diarrhea   . Acute kidney injury ([place]) 05/28/2020  . Acute on chronic renal failure ([place]) 05/28/2020  . Iron deficiency anemia due to chronic blood loss 05/19/2020  . Urinary retention 04/15/2020  . Bladder wall thickening   . Malignant neoplasm of colon ([place])   . Gastrointestinal hemorrhage   . Hypokalemia   . Macular degeneration   . Symptomatic anemia 04/04/2020  . Anemia 08/28/2019  . High risk for hip fracture 06/18/2019  . Osteopenia after menopause 06/18/2019  . Stage 3 chronic kidney  disease 06/18/2019  . Essential hypertension 05/15/2019  . Other hyperlipidemia 05/15/2019    Past Surgical History:  Procedure Laterality Date  . BIOPSY  04/06/2020   Procedure: BIOPSY;  Surgeon: Rogene Houston, MD;  [facility]: [facility];  Service: Endoscopy;;  . CARPAL TUNNEL RELEASE Right   . CATARACT EXTRACTION W/PHACO Left 10/11/2019   Procedure: CATARACT EXTRACTION PHACO AND INTRAOCULAR LENS PLACEMENT (IOC);  Surgeon: Baruch Goldmann, MD;  Location: [facility];  Service: Ophthalmology;  Laterality: Left;  CDE: 8.56  . CATARACT EXTRACTION W/PHACO Right 10/25/2019   Procedure: CATARACT EXTRACTION PHACO AND INTRAOCULAR LENS PLACEMENT (IOC);  Surgeon: Baruch Goldmann, MD;  Location: [facility];  Service: Ophthalmology;  Laterality: Right;  CDE: 5.67  . CESAREAN SECTION    . COLONOSCOPY N/A 04/06/2020   Procedure: COLONOSCOPY;  Surgeon: Rogene Houston, MD;  Location: [facility];  Service: Endoscopy;  Laterality: N/A;  . CYSTOSCOPY WITH BIOPSY N/A 04/08/2020   Procedure: CYSTOSCOPY WITH BIOPSY;  Surgeon: Cleon Gustin, MD;  Location: [facility];  Service: Urology;  Laterality: N/A;  . ESOPHAGOGASTRODUODENOSCOPY N/A 04/05/2020   Procedure: ESOPHAGOGASTRODUODENOSCOPY (EGD);  Surgeon: Rogene Houston, MD;  [facility];  Service: Endoscopy;  Laterality: N/A;  . PARTIAL COLECTOMY N/A 04/08/2020   Procedure: PARTIAL COLECTOMY;  Surgeon: Virl Cagey, MD;  Location: [facility];  Service: General;  Laterality:  N/A;  . PORTACATH PLACEMENT Left 05/18/2020   Procedure: INSERTION PORT-A-CATH (ATTACHED CATHETER IN LEFT SUBCLAVIAN);  Surgeon: Virl Cagey, MD;  Location: [facility];  Service: General;  Laterality: Left;  . TONSILLECTOMY       OB History   No obstetric history on file.     Family History  Problem Relation Age of Onset  . Macular degeneration Mother   . Stroke Mother   . Hypertension Mother   . Dementia Mother   . Cancer Father        lung  . Diabetes Sister   .  Hypertension Sister     Social History   Tobacco Use  . Smoking status: Never Smoker  . Smokeless tobacco: Never Used  Vaping Use  . Vaping Use: Never used  Substance Use Topics  . Alcohol use: No  . Drug use: No    Home Medications Prior to Admission medications   Medication Sig Start Date End Date Taking? Authorizing Provider  alendronate (FOSAMAX) 70 MG tablet Take 1 tablet (70 mg total) by mouth every 7 (seven) days. Take with a full glass of water on an empty stomach. 09/09/19  Yes Gottschalk, Leatrice Jewels M, DO  amLODipine (NORVASC) 5 MG tablet Take 1 tablet (5 mg total) by mouth daily. 05/30/20 05/30/21 Yes Barton Dubois, MD  atorvastatin (LIPITOR) 20 MG tablet Take 1 tablet (20 mg total) by mouth daily. 03/20/20  Yes Ronnie Doss M, DO  cholecalciferol (VITAMIN D3) 25 MCG (1000 UT) tablet Take 1,000 Units by mouth daily.   Yes [provider]  diclofenac Sodium (VOLTAREN) 1 % GEL Apply 2 g topically 4 (four) times daily.   Yes [provider]  diphenoxylate-atropine (LOMOTIL) 2.5-0.025 MG tablet Take 1 tablet by mouth every 8 (eight) hours as needed for diarrhea or loose stools. 05/30/20 05/30/21 Yes Barton Dubois, MD  dorzolamide-timolol (COSOPT) 22.3-6.8 MG/ML ophthalmic solution Place 1 drop into both eyes daily. 03/30/20  Yes Bernarda Caffey, MD  ferrous sulfate 325 (65 FE) MG EC tablet Take 1 tablet (325 mg total) by mouth 2 (two) times daily with a meal. 06/25/19  Yes Ronnie Doss M, DO  fluorouracil CALGB 16606 in sodium chloride 0.9 % 150 mL Inject into the vein over 48 hr. 05/19/20  Yes [provider]  FLUOROURACIL IV Inject into the vein every 14 (fourteen) days. 05/19/20  Yes [provider]  LEUCOVORIN CALCIUM IV Inject into the vein every 14 (fourteen) days. 05/19/20  Yes [provider]  potassium chloride SA (KLOR-CON) 20 MEQ tablet Take 1 tablet (20 mEq total) by mouth daily. 04/21/20  Yes Hawks, Christy A, FNP    prochlorperazine (COMPAZINE) 10 MG tablet Take 1 tablet (10 mg total) by mouth every 6 (six) hours as needed for nausea or vomiting. 05/30/20  Yes Barton Dubois, MD  acetaminophen (TYLENOL) 500 MG tablet Take 1,000 mg by mouth every 6 (six) hours as needed for moderate pain.  Patient not taking: Reported on 05/19/2020    [provider]  lidocaine-prilocaine (EMLA) cream Apply a small amount to port a cath site and cover with plastic wrap 1 hour prior to chemotherapy appointments Patient not taking: Reported on 05/19/2020 05/14/20   Derek Jack, MD  OXALIPLATIN IV Inject into the vein every 14 (fourteen) days. 05/19/20   [provider]    Allergies    Lisinopril  Review of Systems   Review of Systems  Constitutional: Positive for fatigue. Negative for chills and fever.  HENT: Negative for congestion and sore throat.   Eyes: Negative.   Respiratory: Negative for chest tightness and shortness of breath.   Cardiovascular: Negative for chest pain.  Gastrointestinal: Positive for abdominal distention, diarrhea, nausea and vomiting. Negative for abdominal pain.       Denies abd pain, reports "soreness" lower abdomen.  Genitourinary: Negative.   Musculoskeletal: Negative for arthralgias, joint swelling and neck pain.  Skin: Negative.  Negative for rash and wound.  Neurological: Positive for weakness. Negative for dizziness, light-headedness, numbness and headaches.  Psychiatric/Behavioral: Negative.     Physical Exam Updated Vital Signs BP (!) 140/40   Pulse 82   Temp 97.7 F (36.5 C) (Oral)   Resp 18   Ht 5\' 2"  (1.575 m)   Wt 66.1 kg   SpO2 97%   BMI 26.65 kg/m   Physical Exam  ED Results / Procedures / Treatments   Labs (all labs ordered are listed, but only abnormal results are displayed) Labs Reviewed  C DIFFICILE QUICK SCREEN W PCR REFLEX - Abnormal; Notable for the following components:      Result Value   C Diff antigen POSITIVE (*)    C  Diff toxin POSITIVE (*)    All other components within normal limits  CBC WITH DIFFERENTIAL/PLATELET - Abnormal; Notable for the following components:   RBC 3.16 (*)    Hemoglobin 9.2 (*)    HCT 30.2 (*)    RDW 19.0 (*)    Neutro Abs 8.5 (*)    Lymphs Abs 0.5 (*)    Monocytes Absolute 1.2 (*)    Abs Immature Granulocytes 0.22 (*)    All other components within normal limits  COMPREHENSIVE METABOLIC PANEL - Abnormal; Notable for the following components:   Potassium 2.3 (*)    CO2 21 (*)    Glucose, Bld 116 (*)    BUN 24 (*)    Creatinine, Ser 1.85 (*)    Calcium 8.4 (*)    Total Protein 5.7 (*)    Albumin 2.5 (*)    AST 12 (*)    GFR calc non Af Amer 27 (*)    GFR calc Af Amer 31 (*)    All other components within normal limits  URINALYSIS, ROUTINE W REFLEX MICROSCOPIC - Abnormal; Notable for the following components:   APPearance CLOUDY (*)    Hgb urine dipstick SMALL (*)    Protein, ur 30 (*)    Leukocytes,Ua MODERATE (*)    WBC, UA >50 (*)    Bacteria, UA RARE (*)    All other components within normal limits  GASTROINTESTINAL PANEL BY PCR, STOOL (REPLACES STOOL CULTURE)    EKG None  Radiology CT ABDOMEN PELVIS WO CONTRAST  Result Date: 06/02/2020 CLINICAL DATA:  Vomiting and diarrhea. Ascending colon cancer, prior right hemicolectomy. EXAM: CT ABDOMEN AND PELVIS WITHOUT CONTRAST TECHNIQUE: Multidetector CT imaging of the abdomen and pelvis was performed following the standard protocol without IV contrast. COMPARISON:  PET-CT 05/11/2020 and CT abdomen from 04/06/2020 FINDINGS: Lower chest: Descending thoracic aortic atherosclerotic calcification. Mild mitral and aortic valve calcification. Low-density blood pool raise the possibility of anemia. Small right and trace left pleural effusion. Mild cardiomegaly. Bandlike density in the left lower lobe favoring atelectasis. Left lower lobe nodule 0.5 cm in diameter on image 14/4, stable. Subtle nodularity medially in the right  lower lobe on images 29-34 of series 4, nonspecific. There is some faint geographic ground-glass opacities in the lower lobes Hepatobiliary: Borderline dilated gallbladder.  No significant extrahepatic biliary dilatation. No obvious noncontrast CT abnormality of the hepatic parenchyma. Pancreas: Unremarkable Spleen: Unremarkable Adrenals/Urinary Tract: Both adrenal glands appear normal. Hyperdense peripheral 6 mm lesion of the right kidney upper pole on image 33/2 is stable and probably a complex cyst but technically nonspecific. Other hypodense bilateral renal lesions are probably cysts. No hydronephrosis or hydroureter. There is diffuse urinary bladder wall thickening which could reflect cystitis but was also present on the prior PET-CT from 05/11/2020. Stomach/Bowel: Right hemicolectomy with diffuse wall thickening in the remaining colon especially the descending colon and sigmoid colon, with air-fluid levels. There is some mildly dilated loops of small bowel in the abdomen along with some additional borderline dilated loops. A few small segments of small-bowel are not particularly dilated. There scattered air-fluid levels in the small bowel loops no specific lead point for obstruction identified. No pneumatosis or compelling findings of breakdown of the coloenteric anastomotic site in the right abdomen. Vascular/Lymphatic: Aortoiliac atherosclerotic vascular disease. No pathologic adenopathy. Reproductive: Unremarkable Other: New mild ascites, most notable along the paracolic gutters and in the perihepatic and perisplenic regions. New mesenteric edema. Musculoskeletal: Thoracic and lumbar spondylosis. Mid and lower lumbar degenerative disc disease. IMPRESSION: 1. Diffuse wall thickening in the remaining colon especially the descending colon and sigmoid colon, with air-fluid levels. There is some mildly dilated loops of small bowel in the abdomen along with scattered air-fluid levels. No specific lead point for  obstruction identified. Appearance suggests enterocolitis potentially with ileus. Given the thickening in the distal colon, consider testing for C difficile toxin. Strictly speaking on today's noncontrast examination I cannot exclude ischemia, but no pneumatosis is observed. 2. New mild ascites and mesenteric edema. 3. Small right and trace left pleural effusions. 4. Low-density blood pool raise the possibility of anemia. 5. Mild cardiomegaly. Mild mitral and aortic valve calcification. 6. Stable 0.5 cm in diameter left lower lobe pulmonary nodule, nonspecific. 7. Hyperdense peripheral 6 mm lesion of the right kidney upper pole is stable and probably a complex cyst but technically nonspecific. Other hypodense bilateral renal lesions are probably cysts. 8. Subtle nodularity medially in the right lower lobe on images 29-34 of series 4, nonspecific. 9. Diffuse urinary bladder wall thickening could reflect cystitis but was also present on the prior PET-CT from 05/11/2020. 10. Aortic atherosclerosis. Aortic Atherosclerosis (ICD10-I70.0). Electronically Signed   By: Van Clines M.D.   On: 06/02/2020 12:49   DG ABD ACUTE 2+V W 1V CHEST  Result Date: 06/02/2020 CLINICAL DATA:  Abdominal distention.  Diarrhea. EXAM: DG ABDOMEN ACUTE W/ 1V CHEST COMPARISON:  Radiographs dated 05/28/2020 FINDINGS: The heart size and pulmonary vascularity are normal. Power port tip in the superior vena cava, unchanged. Minimal linear atelectasis at the left lung base. The lungs are otherwise clear. There are multiple new slightly distended small bowel loops in the mid abdomen. There is air scattered throughout the nondistended colon. The stomach is not distended. No visible free air or free fluid.  No acute bone abnormality. IMPRESSION: New slightly distended small bowel loops in the mid abdomen. This could represent ileus or partial small bowel obstruction. Electronically Signed   By: Lorriane Shire M.D.   On: 06/02/2020 11:06     Procedures Procedures (including critical care time)  Medications Ordered in ED Medications  potassium chloride 10 mEq in 100 mL IVPB (10 mEq Intravenous New Bag/Given 06/02/20 1235)    ED Course  I have reviewed the triage vital signs and the nursing notes.  Pertinent  labs & imaging results that were available during my care of the patient were reviewed by me and considered in my medical decision making (see chart for details).    MDM Rules/Calculators/A&P                          Patient with persistent diarrhea, increasing weakness since her discharge from here 3 days ago.  She is C. difficile positive here.  She also has a significant hypokalemia at 2.3.  She was given IV potassium to start supplementing this hypokalemia.  CT imaging was performed in order to rule out abdominal obstruction given her distention and increased tympany.  There is no suggestion of an overt obstruction, ileus is suggested.  Patient will require admission for management of her symptoms.  Call placed to hospitalist for admission.   Final Clinical Impression(s) / ED Diagnoses Final diagnoses:  Diarrhea, unspecified type  Hypokalemia  C. difficile colitis    Rx / DC Orders ED Discharge Orders    None       Landis Martins 06/02/20 1317    Wyvonnia Dusky, MD 06/02/20 1919

## 2020-06-02 NOTE — ED Notes (Signed)
CRITICAL VALUE ALERT  Critical Value:  K 2.3  Date & Time Notied:  6701  Provider Notified: [facility]  Orders Received/Actions taken:

## 2020-06-02 NOTE — ED Triage Notes (Signed)
Pt reports she is cancer pt and was discharged last week for same symptoms of having N/V/D, symptoms have been unresolved since 6/23

## 2020-06-03 ENCOUNTER — Encounter (HOSPITAL_COMMUNITY): Payer: Self-pay | Admitting: Family Medicine

## 2020-06-03 ENCOUNTER — Ambulatory Visit: Payer: Medicare HMO | Admitting: Urology

## 2020-06-03 DIAGNOSIS — C189 Malignant neoplasm of colon, unspecified: Secondary | ICD-10-CM

## 2020-06-03 DIAGNOSIS — D500 Iron deficiency anemia secondary to blood loss (chronic): Secondary | ICD-10-CM

## 2020-06-03 DIAGNOSIS — N179 Acute kidney failure, unspecified: Secondary | ICD-10-CM

## 2020-06-03 DIAGNOSIS — N3289 Other specified disorders of bladder: Secondary | ICD-10-CM

## 2020-06-03 DIAGNOSIS — A0472 Enterocolitis due to Clostridium difficile, not specified as recurrent: Secondary | ICD-10-CM

## 2020-06-03 LAB — CBC WITH DIFFERENTIAL/PLATELET
Abs Immature Granulocytes: 0.16 10*3/uL — ABNORMAL HIGH (ref 0.00–0.07)
Basophils Absolute: 0.1 10*3/uL (ref 0.0–0.1)
Basophils Relative: 1 %
Eosinophils Absolute: 0.1 10*3/uL (ref 0.0–0.5)
Eosinophils Relative: 1 %
HCT: 26.5 % — ABNORMAL LOW (ref 36.0–46.0)
Hemoglobin: 8.3 g/dL — ABNORMAL LOW (ref 12.0–15.0)
Immature Granulocytes: 2 %
Lymphocytes Relative: 7 %
Lymphs Abs: 0.8 10*3/uL (ref 0.7–4.0)
MCH: 29.4 pg (ref 26.0–34.0)
MCHC: 31.3 g/dL (ref 30.0–36.0)
MCV: 94 fL (ref 80.0–100.0)
Monocytes Absolute: 1.4 10*3/uL — ABNORMAL HIGH (ref 0.1–1.0)
Monocytes Relative: 13 %
Neutro Abs: 8.1 10*3/uL — ABNORMAL HIGH (ref 1.7–7.7)
Neutrophils Relative %: 76 %
Platelets: 238 10*3/uL (ref 150–400)
RBC: 2.82 MIL/uL — ABNORMAL LOW (ref 3.87–5.11)
RDW: 19.1 % — ABNORMAL HIGH (ref 11.5–15.5)
WBC Morphology: INCREASED
WBC: 10.5 10*3/uL (ref 4.0–10.5)
nRBC: 0.3 % — ABNORMAL HIGH (ref 0.0–0.2)

## 2020-06-03 LAB — GASTROINTESTINAL PANEL BY PCR, STOOL (REPLACES STOOL CULTURE)

## 2020-06-03 LAB — MAGNESIUM: Magnesium: 1.7 mg/dL (ref 1.7–2.4)

## 2020-06-03 LAB — COMPREHENSIVE METABOLIC PANEL
ALT: 7 U/L (ref 0–44)
AST: 9 U/L — ABNORMAL LOW (ref 15–41)
Albumin: 2.1 g/dL — ABNORMAL LOW (ref 3.5–5.0)
Alkaline Phosphatase: 76 U/L (ref 38–126)
Anion gap: 10 (ref 5–15)
BUN: 23 mg/dL (ref 8–23)
CO2: 19 mmol/L — ABNORMAL LOW (ref 22–32)
Calcium: 7.7 mg/dL — ABNORMAL LOW (ref 8.9–10.3)
Chloride: 108 mmol/L (ref 98–111)
Creatinine, Ser: 1.55 mg/dL — ABNORMAL HIGH (ref 0.44–1.00)
GFR calc Af Amer: 38 mL/min — ABNORMAL LOW (ref >60)
GFR calc non Af Amer: 33 mL/min — ABNORMAL LOW (ref >60)
Glucose, Bld: 94 mg/dL (ref 70–99)
Potassium: 2.2 mmol/L — CL (ref 3.5–5.1)
Sodium: 137 mmol/L (ref 135–145)
Total Bilirubin: 0.3 mg/dL (ref 0.3–1.2)
Total Protein: 5 g/dL — ABNORMAL LOW (ref 6.5–8.1)

## 2020-06-03 MED ORDER — ENOXAPARIN SODIUM 30 MG/0.3ML ~~LOC~~ SOLN
30.0000 mg | SUBCUTANEOUS | Status: DC
  Administered 2020-06-03 – 2020-06-04 (×2): 30 mg via SUBCUTANEOUS
  Filled 2020-06-03 (×2): qty 0.3

## 2020-06-03 MED ORDER — POTASSIUM CHLORIDE 10 MEQ/100ML IV SOLN
10.0000 meq | INTRAVENOUS | Status: AC
  Administered 2020-06-03 (×4): 10 meq via INTRAVENOUS
  Filled 2020-06-03 (×4): qty 100

## 2020-06-03 MED ORDER — CHLORHEXIDINE GLUCONATE CLOTH 2 % EX PADS
6.0000 | MEDICATED_PAD | Freq: Every day | CUTANEOUS | Status: DC
  Administered 2020-06-04 – 2020-06-05 (×2): 6 via TOPICAL

## 2020-06-03 MED ORDER — POTASSIUM CHLORIDE CRYS ER 20 MEQ PO TBCR
40.0000 meq | EXTENDED_RELEASE_TABLET | Freq: Two times a day (BID) | ORAL | Status: DC
  Administered 2020-06-03 – 2020-06-05 (×5): 40 meq via ORAL
  Filled 2020-06-03 (×5): qty 2

## 2020-06-03 NOTE — Consult Note (Signed)
GI Inpatient Consult Note  Reason for Consult: Cdiff      History of Present Illness: Gabrielle [name] is a 74 y.o. female seen for evaluation of C. Diff. PMHX of colon cancer dx, s/p Right Hemicolectomy 04/07/20 and on FOLFOX chemo. Had IV cefazolin prior to port placement 05/18/20. Also hypokalemic on admission w/ K+ 2.3   Initially seen in hospital 04/05/20 prior to diagnosis of colon cancer for severe IDA (hgb 5.7)   Admitted yesterday for n/v/diarrhea w/ severe hypoK on admission. Also admitted 05/28/20 to 05/30/20 for diarrhea but did not have Cdiff testing that admission.   Reports doing very well immediately after surgery in May 2021 and was feeling well when she started chemotherapy on June 17.  A few days after she developed initially diarrhea with weakness.  She was admitted last week as above.  She was discharged home and continued to have diarrhea as well as developed vomiting yesterday morning which prompted her to come back to the ER.  She was having "soreness" in her abdomen as well yesterday but denies specific abd pain.  She denies seeing any obvious blood in her stools.  She had been on a bland diet since discharged last week w/ diarrhea..  Reports since admission her abdominal discomfort has improved.  She last vomited yesterday in the emergency room, she had one episode of vomiting after taking oral Vanco but reports she was able to take a dose last night with ginger ale without vomiting.  She has had 4 loose liquid bowel movements this morning without any obvious blood.  She feels she is less distended than yesterday. Has been afebrile.    Last Colonoscopy: 04/06/20-- infiltrative, polypoid and ulcerated non-obstructing large mass was found in the ascending colon. The mass was circumferential. The mass measured eight cm in length. No bleeding was present. This was biopsied with a cold forceps for histology.    Last Endoscopy: erosive gastropathy otherwise normal.    Past  Medical History:  Past Medical History:  Diagnosis Date  . Arthritis   . Cancer ([place])   . Cataract    OU  . Colon cancer ([place])   . H/O cesarean section   . Hx of tonsillectomy   . Hyperlipidemia   . Hypertension   . Macular degeneration    Exu ARMD OU    Problem List: Patient Active Problem List   Diagnosis Date Noted  . Enterocolitis due to Clostridioides difficile 06/02/2020  . DNR (do not resuscitate) 06/02/2020  . Diarrhea associated with pseudomembranous colitis 06/02/2020  . Nausea vomiting and diarrhea   . Acute kidney injury ([place]) 05/28/2020  . Acute on chronic renal failure ([place]) 05/28/2020  . Iron deficiency anemia due to chronic blood loss 05/19/2020  . Urinary retention 04/15/2020  . Bladder wall thickening   . Malignant neoplasm of colon ([place])   . Gastrointestinal hemorrhage   . Hypokalemia   . Macular degeneration   . Symptomatic anemia 04/04/2020  . Anemia 08/28/2019  . High risk for hip fracture 06/18/2019  . Osteopenia after menopause 06/18/2019  . Stage 3 chronic kidney disease 06/18/2019  . Essential hypertension 05/15/2019  . Other hyperlipidemia 05/15/2019    Past Surgical History: Past Surgical History:  Procedure Laterality Date  . BIOPSY  04/06/2020   Procedure: BIOPSY;  Surgeon: Rogene Houston, MD;  Location: [facility];  Service: Endoscopy;;  . CARPAL TUNNEL RELEASE Right   . CATARACT EXTRACTION W/PHACO Left 10/11/2019   Procedure:  CATARACT EXTRACTION PHACO AND INTRAOCULAR LENS PLACEMENT (IOC);  Surgeon: Baruch Goldmann, MD;  Location: [facility];  Service: Ophthalmology;  Laterality: Left;  CDE: 8.56  . CATARACT EXTRACTION W/PHACO Right 10/25/2019   Procedure: CATARACT EXTRACTION PHACO AND INTRAOCULAR LENS PLACEMENT (IOC);  Surgeon: Baruch Goldmann, MD;  Location: [facility];  Service: Ophthalmology;  Laterality: Right;  CDE: 5.67  . CESAREAN SECTION    . COLONOSCOPY N/A 04/06/2020   Procedure: COLONOSCOPY;  Surgeon: Rogene Houston, MD;  Location:  [facility];  Service: Endoscopy;  Laterality: N/A;  . CYSTOSCOPY WITH BIOPSY N/A 04/08/2020   Procedure: CYSTOSCOPY WITH BIOPSY;  Surgeon: Cleon Gustin, MD;  Location: [facility];  Service: Urology;  Laterality: N/A;  . ESOPHAGOGASTRODUODENOSCOPY N/A 04/05/2020   Procedure: ESOPHAGOGASTRODUODENOSCOPY (EGD);  Surgeon: Rogene Houston, MD;  [facility]: [facility];  Service: Endoscopy;  Laterality: N/A;  . PARTIAL COLECTOMY N/A 04/08/2020   Procedure: PARTIAL COLECTOMY;  Surgeon: Virl Cagey, MD;  Location: [facility];  Service: General;  Laterality: N/A;  . PORTACATH PLACEMENT Left 05/18/2020   Procedure: INSERTION PORT-A-CATH (ATTACHED CATHETER IN LEFT SUBCLAVIAN);  Surgeon: Virl Cagey, MD;  Location: [facility];  Service: General;  Laterality: Left;  . TONSILLECTOMY      Allergies: Allergies  Allergen Reactions  . Lisinopril Cough    Home Medications: Medications Prior to Admission  Medication Sig Dispense Refill Last Dose  . alendronate (FOSAMAX) 70 MG tablet Take 1 tablet (70 mg total) by mouth every 7 (seven) days. Take with a full glass of water on an empty stomach. 4 tablet 11 Past Week at Unknown time  . amLODipine (NORVASC) 5 MG tablet Take 1 tablet (5 mg total) by mouth daily. 30 tablet 1 06/01/2020 at Unknown time  . atorvastatin (LIPITOR) 20 MG tablet Take 1 tablet (20 mg total) by mouth daily. 90 tablet 3 06/01/2020 at Unknown time  . cholecalciferol (VITAMIN D3) 25 MCG (1000 UT) tablet Take 1,000 Units by mouth daily.   06/01/2020 at Unknown time  . diclofenac Sodium (VOLTAREN) 1 % GEL Apply 2 g topically 4 (four) times daily.     . diphenoxylate-atropine (LOMOTIL) 2.5-0.025 MG tablet Take 1 tablet by mouth every 8 (eight) hours as needed for diarrhea or loose stools. 30 tablet 1   . dorzolamide-timolol (COSOPT) 22.3-6.8 MG/ML ophthalmic solution Place 1 drop into both eyes daily. 10 mL 2 06/01/2020 at Unknown time  . ferrous sulfate 325 (65 FE) MG EC tablet Take 1 tablet  (325 mg total) by mouth 2 (two) times daily with a meal. 180 tablet 3 06/01/2020 at Unknown time  . fluorouracil CALGB 11914 in sodium chloride 0.9 % 150 mL Inject into the vein over 48 hr.     . FLUOROURACIL IV Inject into the vein every 14 (fourteen) days.     Marland Kitchen LEUCOVORIN CALCIUM IV Inject into the vein every 14 (fourteen) days.     . potassium chloride SA (KLOR-CON) 20 MEQ tablet Take 1 tablet (20 mEq total) by mouth daily. 90 tablet 1 06/01/2020 at Unknown time  . prochlorperazine (COMPAZINE) 10 MG tablet Take 1 tablet (10 mg total) by mouth every 6 (six) hours as needed for nausea or vomiting. 30 tablet 0   . acetaminophen (TYLENOL) 500 MG tablet Take 1,000 mg by mouth every 6 (six) hours as needed for moderate pain.  (Patient not taking: Reported on 05/19/2020)     . lidocaine-prilocaine (EMLA) cream Apply a small amount to port a  cath site and cover with plastic wrap 1 hour prior to chemotherapy appointments (Patient not taking: Reported on 05/19/2020) 30 g 0 Not Taking at Unknown time  . OXALIPLATIN IV Inject into the vein every 14 (fourteen) days.      Home medication reconciliation was completed with the patient.   Scheduled Inpatient Medications:   . amLODipine  5 mg Oral Daily  . atorvastatin  20 mg Oral QPM  . Chlorhexidine Gluconate Cloth  6 each Topical Daily  . cholecalciferol  1,000 Units Oral Daily  . dorzolamide-timolol  1 drop Both Eyes Daily  . enoxaparin (LOVENOX) injection  40 mg Subcutaneous Q24H  . ferrous sulfate  325 mg Oral Q breakfast  . pantoprazole  40 mg Oral Q0600  . potassium chloride  40 mEq Oral BID  . saccharomyces boulardii  250 mg Oral BID  . vancomycin  125 mg Oral QID    Continuous Inpatient Infusions:   . 0.9 % NaCl with KCl 20 mEq / L 70 mL/hr at 06/03/20 0601  . metronidazole 500 mg (06/03/20 0602)  . potassium chloride      PRN Inpatient Medications:  acetaminophen **OR** acetaminophen, diclofenac Sodium, fentaNYL (SUBLIMAZE) injection,  metoprolol tartrate, ondansetron (ZOFRAN) IV, oxyCODONE, prochlorperazine, traZODone  Family History: family history includes Cancer in her father; Dementia in her mother; Diabetes in her sister; Hypertension in her mother and sister; Macular degeneration in her mother; Stroke in her mother.    Social History:   reports that she has never smoked. She has never used smokeless tobacco. She reports that she does not drink alcohol and does not use drugs.   Review of Systems: Constitutional: Weight is stable.  Eyes: No changes in vision. ENT: No oral lesions, sore throat.  GI: see HPI.  Heme/Lymph: No easy bruising.  CV: No chest pain.  GU: No hematuria.  Integumentary: No rashes.  Neuro: No headaches.  Psych: No depression/anxiety.  Endocrine: No heat/cold intolerance.  Allergic/Immunologic: No urticaria.  Resp: No cough, SOB.  Musculoskeletal: No joint swelling.    Physical Examination: BP (!) 101/36 (BP Location: Left Arm)   Pulse 83   Temp 98.2 F (36.8 C) (Oral)   Resp 20   Ht 5\' 2"  (1.575 m)   Wt 61.4 kg   SpO2 97%   BMI 24.76 kg/m  Gen: NAD, alert and oriented x 4 HEENT: PEERLA, EOMI, Neck: supple, no JVD or thyromegaly Chest: CTA bilaterally, no wheezes, crackles, or other adventitious sounds CV: RRR, no m/g/c/r Abd: soft, fullness but non distended and non tender, +BS in all four quadrants; no HSM, guarding, ridigity, or rebound tenderness Ext: no edema, well perfused with 2+ pulses, Skin: no rash or lesions noted Lymph: no LAD  Data: Lab Results  Component Value Date   WBC 10.5 06/03/2020   HGB 8.3 (L) 06/03/2020   HCT 26.5 (L) 06/03/2020   MCV 94.0 06/03/2020   PLT 238 06/03/2020   Recent Labs  Lab 05/30/20 0347 06/02/20 0933 06/03/20 0553  HGB 9.1* 9.2* 8.3*   Lab Results  Component Value Date   NA 137 06/03/2020   K 2.2 (LL) 06/03/2020   CL 108 06/03/2020   CO2 19 (L) 06/03/2020   BUN 23 06/03/2020   CREATININE 1.55 (H) 06/03/2020   K+ on  admission yesterday 2.3, Cr 1.85, albumin 2.5, GFR 31, Hgb on admission 9.2   05/28/20--Cr 2.65, K+ 3.0, albumin 2.6, CBC 9.2  Lab Results  Component Value Date   ALT 7  06/03/2020   AST 9 (L) 06/03/2020   ALKPHOS 76 06/03/2020   BILITOT 0.3 06/03/2020   No results for input(s): APTT, INR, PTT in the last 168 hours.   +C diff antigen and toxin      XRay--06/02/20--IMPRESSION: New slightly distended small bowel loops in the mid abdomen. This could represent ileus or partial small bowel obstruction  CT a./p 06/02/20--IMPRESSION: 1. Diffuse wall thickening in the remaining colon especially the descending colon and sigmoid colon, with air-fluid levels. There is some mildly dilated loops of small bowel in the abdomen along with scattered air-fluid levels. No specific lead point for obstruction identified. Appearance suggests enterocolitis potentially with ileus. Given the thickening in the distal colon, consider testing for C difficile toxin. Strictly speaking on today's noncontrast examination I cannot exclude ischemia, but no pneumatosis is observed. 2. New mild ascites and mesenteric edema. 3. Small right and trace left pleural effusions. 4. Low-density blood pool raise the possibility of anemia. 5. Mild cardiomegaly. Mild mitral and aortic valve calcification. 6. Stable 0.5 cm in diameter left lower lobe pulmonary nodule, nonspecific. 7. Hyperdense peripheral 6 mm lesion of the right kidney upper pole is stable and probably a complex cyst but technically nonspecific. Other hypodense bilateral renal lesions are probably cysts. 8. Subtle nodularity medially in the right lower lobe on images 29-34 of series 4, nonspecific. 9. Diffuse urinary bladder wall thickening could reflect cystitis but was also present on the prior PET-CT from 05/11/2020. 10. Aortic atherosclerosis.   Assessment/Plan: Ms. Seneca is a 74 y.o. female admitted for diarrhea and found to have chemotherapy  associated C. Difficile with positive antigen and toxy.  She is on IV Flagyl and Vanco -Continue supportive care including replacing her potassium (2.2 this AM). Cr improved since admission. WBC normal.  Will advance her to a clear liquid diet as tolerated.  Drop in hemoglobin may be dilutional-will follow.  Mesenteric edema likely due to low albumin of 2.6. CT/xray images from yesterday reviewed by Dr Laural Golden.    Case was discussed with Dr. Laural Golden who will provide further recommendations. Thank you for the consult. Please call with questions or concerns.  Laurine Blazer, PA-C [facility]

## 2020-06-03 NOTE — Progress Notes (Signed)
PROGRESS NOTE    Gabrielle [name]  PYP:[id number] DOB: 1946/11/30 DOA: 06/02/2020 PCP: Janora Norlander, DO   Brief Narrative:  Per HPI: Gabrielle [name] is a 74 y.o. 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  She was hospitalized earlier this month and discharge a few days ago after being admitted for nausea vomiting and diarrhea that was thought likely chemotherapy-induced.  She is status post right hemicolectomy.  She had an acute kidney injury during last admission.  She returned to the emergency department today because she continues to have nonbloody diarrhea.  The frequency has increased.  She had 5 episodes of diarrhea yesterday and continues to have uncontrolled diarrhea.  She also has nausea and vomiting.  She has some abdominal distention but no significant abdominal pain.  She has been taking Lomotil with no improvement in her diarrhea.  Her last known antibiotic was given on 6/14 prior to Port-A-Cath placement where she received a dose of cefazolin.  Her other past medical history as detailed below.     Assessment & Plan:   Principal Problem:   Enterocolitis due to Clostridioides difficile Active Problems:   Essential hypertension   Stage 3 chronic kidney disease   Anemia   Macular degeneration   Malignant neoplasm of colon (HCC)   Hypokalemia   Bladder wall thickening   Iron deficiency anemia due to chronic blood loss   Acute kidney injury ([place])   DNR (do not resuscitate)   Diarrhea associated with pseudomembranous colitis   1. Severe C difficile enterocolitis - with concern for partial small bowel obstruction - treating for severe disease with oral vancomcyin and IV metronidazole.  Continue supportive care with IV fluids.  Diet advanced to clear liquids by GI.  Appreciate consultation given the complexity of this case. 2. Ileus / Partial SBO -continued treatment  for C. difficile.  Advance diet to clears today. 3. Essential hypertension - resumed home meds, follow.  Currently controlled. 4. Hypokalemia -continues to be severe.  IV and oral repletion ordered.  Recheck in a.m.  Magnesium within normal limits. 5. Bladder wall thickening - chronic finding, no signs or symptoms of cystitis.   6. AKI on stage 3b CKD - continue IV fluid hydration.  Improving. 7. Iron deficiency anemia -hold iron supplementation for now. 8. Diarrhea associated with C diff - hold lomotil for now, treat C diff aggressively.  9. Stage IIIb poorly differentiated right colon adenocarcinoma - Pt recently had port placed and started on chemotherapy with plans to give every 14 days.  Follow up with Dr. Delton Coombes. 10. DNR present on admission - continue in hospital.    DVT prophylaxis:Lovenox Code Status: Full Family Communication: Tried calling daughter with no response. Disposition Plan:   Status is: Inpatient  Remains inpatient appropriate because:IV treatments appropriate due to intensity of illness or inability to take PO and Inpatient level of care appropriate due to severity of illness   Dispo: The patient is from: Home              Anticipated d/c is to: Home              Anticipated d/c date is: 2 days              Patient currently is not medically stable to d/c.  Consultants:   GI  Procedures:   See below  Antimicrobials:  Anti-infectives (From admission, onward)  Start     Dose/Rate Route Frequency Ordered Stop   06/02/20 1400  vancomycin (VANCOCIN) 50 mg/mL oral solution 125 mg     Discontinue     125 mg Oral 4 times daily 06/02/20 1334 06/12/20 1359   06/02/20 1400  metroNIDAZOLE (FLAGYL) IVPB 500 mg     Discontinue     500 mg 100 mL/hr over 60 Minutes Intravenous Every 8 hours 06/02/20 1347         Subjective: Patient seen and evaluated today with decreasing nausea and vomiting noted this morning.  She is able to tolerate some of her oral  medications.  She did have a liquidy bowel movement noted this morning.  She denies any significant abdominal pain.  Objective: Vitals:   06/03/20 0431 06/03/20 0500 06/03/20 0830 06/03/20 1101  BP: (!) 101/36  (!) 133/53 (!) 133/53  Pulse: 83     Resp: 20  18   Temp: 98.2 F (36.8 C)  97.8 F (36.6 C)   TempSrc: Oral  Oral   SpO2: 97%  100%   Weight:  61.4 kg    Height:        Intake/Output Summary (Last 24 hours) at 06/03/2020 1209 Last data filed at 06/03/2020 0400 Gross per 24 hour  Intake 1036.36 ml  Output --  Net 1036.36 ml   Filed Weights   06/02/20 0902 06/03/20 0500  Weight: 66.1 kg 61.4 kg    Examination:  General exam: Appears calm and comfortable  Respiratory system: Clear to auscultation. Respiratory effort normal. Cardiovascular system: S1 & S2 heard, RRR. No JVD, murmurs, rubs, gallops or clicks. No pedal edema. Gastrointestinal system: Abdomen is nondistended, soft and nontender. No organomegaly or masses felt. Normal bowel sounds heard. Central nervous system: Alert and oriented. No focal neurological deficits. Extremities: Symmetric 5 x 5 power. Skin: No rashes, lesions or ulcers Psychiatry: Judgement and insight appear normal. Mood & affect appropriate.     Data Reviewed: I have personally reviewed following labs and imaging studies  CBC: Recent Labs  Lab 05/27/20 2355 05/28/20 0402 05/30/20 0347 06/02/20 0933 06/03/20 0553  WBC 10.6* 9.4 2.5* 10.4 10.5  NEUTROABS  --   --   --  8.5* 8.1*  HGB 9.6* 9.2* 9.1* 9.2* 8.3*  HCT 31.1* 30.2* 30.1* 30.2* 26.5*  MCV 96.0 95.6 96.2 95.6 94.0  PLT 116* 130* 128* 206 081   Basic Metabolic Panel: Recent Labs  Lab 05/28/20 0402 05/29/20 0413 05/30/20 0347 06/02/20 0933 06/03/20 0553  NA 136 134* 137 138 137  K 3.0* 3.3* 3.3* 2.3* 2.2*  CL 101 106 109 106 108  CO2 23 21* 18* 21* 19*  GLUCOSE 129* 103* 115* 116* 94  BUN 37* 28* 20 24* 23  CREATININE 2.65* 1.64* 1.20* 1.85* 1.55*  CALCIUM  7.4* 7.0* 8.3* 8.4* 7.7*  MG  --  1.6* 1.9 1.8 1.7   GFR: Estimated Creatinine Clearance: 27.9 mL/min (A) (by C-G formula based on SCr of 1.55 mg/dL (H)). Liver Function Tests: Recent Labs  Lab 05/27/20 2355 05/28/20 0402 06/02/20 0933 06/03/20 0553  AST 12* 12* 12* 9*  ALT 7 8 9 7   ALKPHOS 59 56 85 76  BILITOT 1.0 0.7 0.4 0.3  PROT 5.9* 5.8* 5.7* 5.0*  ALBUMIN 2.7* 2.6* 2.5* 2.1*   Recent Labs  Lab 05/27/20 2355  LIPASE 19   No results for input(s): AMMONIA in the last 168 hours. Coagulation Profile: No results for input(s): INR, PROTIME in the last 168  hours. Cardiac Enzymes: No results for input(s): CKTOTAL, CKMB, CKMBINDEX, TROPONINI in the last 168 hours. BNP (last 3 results) No results for input(s): PROBNP in the last 8760 hours. HbA1C: No results for input(s): HGBA1C in the last 72 hours. CBG: No results for input(s): GLUCAP in the last 168 hours. Lipid Profile: No results for input(s): CHOL, HDL, LDLCALC, TRIG, CHOLHDL, LDLDIRECT in the last 72 hours. Thyroid Function Tests: No results for input(s): TSH, T4TOTAL, FREET4, T3FREE, THYROIDAB in the last 72 hours. Anemia Panel: No results for input(s): VITAMINB12, FOLATE, FERRITIN, TIBC, IRON, RETICCTPCT in the last 72 hours. Sepsis Labs: Recent Labs  Lab 05/27/20 2355  LATICACIDVEN 1.7    Recent Results (from the past 240 hour(s))  SARS Coronavirus 2 by RT PCR (hospital order, performed in [facility] hospital lab) Nasopharyngeal Nasopharyngeal Swab     Status: None   Collection Time: 05/28/20  1:57 AM   Specimen: Nasopharyngeal Swab  Result Value Ref Range Status   SARS Coronavirus 2 NEGATIVE NEGATIVE Final    Comment: (NOTE) SARS-CoV-2 target nucleic acids are NOT DETECTED.  The SARS-CoV-2 RNA is generally detectable in upper and lower respiratory specimens during the acute phase of infection. The lowest concentration of SARS-CoV-2 viral copies this assay can detect is 250 copies / mL. A negative  result does not preclude SARS-CoV-2 infection and should not be used as the sole basis for treatment or other patient management decisions.  A negative result may occur with improper specimen collection / handling, submission of specimen other than nasopharyngeal swab, presence of viral mutation(s) within the areas targeted by this assay, and inadequate number of viral copies (<250 copies / mL). A negative result must be combined with clinical observations, patient history, and epidemiological information.  Fact Sheet for Patients:   StrictlyIdeas.no  Fact Sheet for Healthcare Providers: BankingDealers.co.za  This test is not yet approved or  cleared by the [place] FDA and has been authorized for detection and/or diagnosis of SARS-CoV-2 by FDA under an Emergency Use Authorization (EUA).  This EUA will remain in effect (meaning this test can be used) for the duration of the COVID-19 declaration under Section 564(b)(1) of the Act, 21 U.S.C. section 360bbb-3(b)(1), unless the authorization is terminated or revoked sooner.  Performed at [facility], [address] [place], [place] [address]   MRSA PCR Screening     Status: None   Collection Time: 05/28/20  2:32 PM   Specimen: Nasal Mucosa; Nasopharyngeal  Result Value Ref Range Status   MRSA by PCR NEGATIVE NEGATIVE Final    Comment:        The GeneXpert MRSA Assay (FDA approved for NASAL specimens only), is one component of a comprehensive MRSA colonization surveillance program. It is not intended to diagnose MRSA infection nor to guide or monitor treatment for MRSA infections. Performed at [facility], [address] [place], [place] [address]   C Difficile Quick Screen w PCR reflex     Status: Abnormal   Collection Time: 06/02/20 11:26 AM   Specimen: Stool  Result Value Ref Range Status   C Diff antigen POSITIVE (A) NEGATIVE Final   C Diff toxin POSITIVE (A) NEGATIVE  Final   C Diff interpretation Toxin producing C. difficile detected.  Final    Comment: CRITICAL RESULT CALLED TO, READ BACK BY AND VERIFIED WITH: OSBORNE T. @ 1217 ON [id number] BY HENDERSON L. Performed at [facility], [address], [place], [place] [address]   SARS Coronavirus 2 by  RT PCR (hospital order, performed in [facility] hospital lab) Nasopharyngeal Nasopharyngeal Swab     Status: None   Collection Time: 06/02/20  1:18 PM   Specimen: Nasopharyngeal Swab  Result Value Ref Range Status   SARS Coronavirus 2 NEGATIVE NEGATIVE Final    Comment: (NOTE) SARS-CoV-2 target nucleic acids are NOT DETECTED.  The SARS-CoV-2 RNA is generally detectable in upper and lower respiratory specimens during the acute phase of infection. The lowest concentration of SARS-CoV-2 viral copies this assay can detect is 250 copies / mL. A negative result does not preclude SARS-CoV-2 infection and should not be used as the sole basis for treatment or other patient management decisions.  A negative result may occur with improper specimen collection / handling, submission of specimen other than nasopharyngeal swab, presence of viral mutation(s) within the areas targeted by this assay, and inadequate number of viral copies (<250 copies / mL). A negative result must be combined with clinical observations, patient history, and epidemiological information.  Fact Sheet for Patients:   StrictlyIdeas.no  Fact Sheet for Healthcare Providers: BankingDealers.co.za  This test is not yet approved or  cleared by the [place] FDA and has been authorized for detection and/or diagnosis of SARS-CoV-2 by FDA under an Emergency Use Authorization (EUA).  This EUA will remain in effect (meaning this test can be used) for the duration of the COVID-19 declaration under Section 564(b)(1) of the Act, 21 U.S.C. section 360bbb-3(b)(1), unless the authorization is terminated  or revoked sooner.  Performed at [facility], [address], [place],  [address]          Radiology Studies: CT ABDOMEN PELVIS WO CONTRAST  Result Date: 06/02/2020 CLINICAL DATA:  Vomiting and diarrhea. Ascending colon cancer, prior right hemicolectomy. EXAM: CT ABDOMEN AND PELVIS WITHOUT CONTRAST TECHNIQUE: Multidetector CT imaging of the abdomen and pelvis was performed following the standard protocol without IV contrast. COMPARISON:  PET-CT 05/11/2020 and CT abdomen from 04/06/2020 FINDINGS: Lower chest: Descending thoracic aortic atherosclerotic calcification. Mild mitral and aortic valve calcification. Low-density blood pool raise the possibility of anemia. Small right and trace left pleural effusion. Mild cardiomegaly. Bandlike density in the left lower lobe favoring atelectasis. Left lower lobe nodule 0.5 cm in diameter on image 14/4, stable. Subtle nodularity medially in the right lower lobe on images 29-34 of series 4, nonspecific. There is some faint geographic ground-glass opacities in the lower lobes Hepatobiliary: Borderline dilated gallbladder. No significant extrahepatic biliary dilatation. No obvious noncontrast CT abnormality of the hepatic parenchyma. Pancreas: Unremarkable Spleen: Unremarkable Adrenals/Urinary Tract: Both adrenal glands appear normal. Hyperdense peripheral 6 mm lesion of the right kidney upper pole on image 33/2 is stable and probably a complex cyst but technically nonspecific. Other hypodense bilateral renal lesions are probably cysts. No hydronephrosis or hydroureter. There is diffuse urinary bladder wall thickening which could reflect cystitis but was also present on the prior PET-CT from 05/11/2020. Stomach/Bowel: Right hemicolectomy with diffuse wall thickening in the remaining colon especially the descending colon and sigmoid colon, with air-fluid levels. There is some mildly dilated loops of small bowel in the abdomen along with some additional  borderline dilated loops. A few small segments of small-bowel are not particularly dilated. There scattered air-fluid levels in the small bowel loops no specific lead point for obstruction identified. No pneumatosis or compelling findings of breakdown of the coloenteric anastomotic site in the right abdomen. Vascular/Lymphatic: Aortoiliac atherosclerotic vascular disease. No pathologic adenopathy. Reproductive: Unremarkable Other: New mild ascites, most notable along  the paracolic gutters and in the perihepatic and perisplenic regions. New mesenteric edema. Musculoskeletal: Thoracic and lumbar spondylosis. Mid and lower lumbar degenerative disc disease. IMPRESSION: 1. Diffuse wall thickening in the remaining colon especially the descending colon and sigmoid colon, with air-fluid levels. There is some mildly dilated loops of small bowel in the abdomen along with scattered air-fluid levels. No specific lead point for obstruction identified. Appearance suggests enterocolitis potentially with ileus. Given the thickening in the distal colon, consider testing for C difficile toxin. Strictly speaking on today's noncontrast examination I cannot exclude ischemia, but no pneumatosis is observed. 2. New mild ascites and mesenteric edema. 3. Small right and trace left pleural effusions. 4. Low-density blood pool raise the possibility of anemia. 5. Mild cardiomegaly. Mild mitral and aortic valve calcification. 6. Stable 0.5 cm in diameter left lower lobe pulmonary nodule, nonspecific. 7. Hyperdense peripheral 6 mm lesion of the right kidney upper pole is stable and probably a complex cyst but technically nonspecific. Other hypodense bilateral renal lesions are probably cysts. 8. Subtle nodularity medially in the right lower lobe on images 29-34 of series 4, nonspecific. 9. Diffuse urinary bladder wall thickening could reflect cystitis but was also present on the prior PET-CT from 05/11/2020. 10. Aortic atherosclerosis. Aortic  Atherosclerosis (ICD10-I70.0). Electronically Signed   By: Van Clines M.D.   On: 06/02/2020 12:49   DG ABD ACUTE 2+V W 1V CHEST  Result Date: 06/02/2020 CLINICAL DATA:  Abdominal distention.  Diarrhea. EXAM: DG ABDOMEN ACUTE W/ 1V CHEST COMPARISON:  Radiographs dated 05/28/2020 FINDINGS: The heart size and pulmonary vascularity are normal. Power port tip in the superior vena cava, unchanged. Minimal linear atelectasis at the left lung base. The lungs are otherwise clear. There are multiple new slightly distended small bowel loops in the mid abdomen. There is air scattered throughout the nondistended colon. The stomach is not distended. No visible free air or free fluid.  No acute bone abnormality. IMPRESSION: New slightly distended small bowel loops in the mid abdomen. This could represent ileus or partial small bowel obstruction. Electronically Signed   By: Lorriane Shire M.D.   On: 06/02/2020 11:06        Scheduled Meds: . amLODipine  5 mg Oral Daily  . atorvastatin  20 mg Oral QPM  . Chlorhexidine Gluconate Cloth  6 each Topical Daily  . cholecalciferol  1,000 Units Oral Daily  . dorzolamide-timolol  1 drop Both Eyes Daily  . enoxaparin (LOVENOX) injection  30 mg Subcutaneous Q24H  . ferrous sulfate  325 mg Oral Q breakfast  . pantoprazole  40 mg Oral Q0600  . potassium chloride  40 mEq Oral BID  . saccharomyces boulardii  250 mg Oral BID  . vancomycin  125 mg Oral QID   Continuous Infusions: . 0.9 % NaCl with KCl 20 mEq / L 70 mL/hr at 06/03/20 0601  . metronidazole 500 mg (06/03/20 0602)     LOS: 1 day    Time spent: 35 minutes    Kainan Patty D Manuella Ghazi, DO [facility]  If 7PM-7AM, please contact night-coverage [email or web address] 06/03/2020, 12:09 PM

## 2020-06-03 NOTE — TOC Initial Note (Signed)
Transition of Care [facility]) - Initial/Assessment Note    Patient Details  Name: Gabrielle [name] MRN: [id number] Date of Birth: [birth year]  Transition of Care [facility]) CM/SW Contact:    Natasha Bence, LCSW Phone Number: 06/03/2020, 12:54 PM  Clinical Narrative:                 Patient is a 74 year old female admitted for enterocolitis due to Colostridioides difficile. Patient's daughter reported patient's [place]  Hx with [facility]. Patient's daughter reported that she does not feel that she is able to meet the Patient's medical needs in the home. Patient's daughter is agreeable to SNF placement pending discharge. TOC to follow.       Activities of Daily Living Home Assistive Devices/Equipment: None ADL Screening (condition at time of admission) Patient's cognitive ability adequate to safely complete daily activities?: Yes Is the patient deaf or have difficulty hearing?: No Does the patient have difficulty seeing, even when wearing glasses/contacts?: No Does the patient have difficulty concentrating, remembering, or making decisions?: No Patient able to express need for assistance with ADLs?: Yes Does the patient have difficulty dressing or bathing?: No Independently performs ADLs?: Yes (appropriate for developmental age) Does the patient have difficulty walking or climbing stairs?: No Weakness of Legs: None Weakness of Arms/Hands: None   Emotional Assessment              Admission diagnosis:  Hypokalemia [E87.6] C. difficile colitis [A04.72] Diarrhea, unspecified type [R19.7] Enterocolitis due to Clostridioides difficile [A04.72] Patient Active Problem List   Diagnosis Date Noted  . Enterocolitis due to Clostridioides difficile 06/02/2020  . DNR (do not resuscitate) 06/02/2020  . Diarrhea associated with pseudomembranous colitis 06/02/2020  . Nausea vomiting and diarrhea   . Acute kidney injury ([place]) 05/28/2020  . Acute on chronic renal failure ([place]) 05/28/2020  . Iron  deficiency anemia due to chronic blood loss 05/19/2020  . Urinary retention 04/15/2020  . Bladder wall thickening   . Malignant neoplasm of colon ([place])   . Gastrointestinal hemorrhage   . Hypokalemia   . Macular degeneration   . Symptomatic anemia 04/04/2020  . Anemia 08/28/2019  . High risk for hip fracture 06/18/2019  . Osteopenia after menopause 06/18/2019  . Stage 3 chronic kidney disease 06/18/2019  . Essential hypertension 05/15/2019  . Other hyperlipidemia 05/15/2019   PCP:  Janora Norlander, DO Pharmacy:   [facility], [place] [address] Phone: [phone] Fax: [phone]   Readmission Risk Interventions No flowsheet data found.

## 2020-06-04 ENCOUNTER — Encounter (HOSPITAL_COMMUNITY): Payer: Medicare HMO

## 2020-06-04 LAB — CBC WITH DIFFERENTIAL/PLATELET
Abs Immature Granulocytes: 0.23 10*3/uL — ABNORMAL HIGH (ref 0.00–0.07)
Basophils Absolute: 0 10*3/uL (ref 0.0–0.1)
Basophils Relative: 0 %
Eosinophils Absolute: 0.1 10*3/uL (ref 0.0–0.5)
Eosinophils Relative: 1 %
HCT: 25.8 % — ABNORMAL LOW (ref 36.0–46.0)
Hemoglobin: 8 g/dL — ABNORMAL LOW (ref 12.0–15.0)
Immature Granulocytes: 3 %
Lymphocytes Relative: 11 %
Lymphs Abs: 1 10*3/uL (ref 0.7–4.0)
MCH: 29.3 pg (ref 26.0–34.0)
MCHC: 31 g/dL (ref 30.0–36.0)
MCV: 94.5 fL (ref 80.0–100.0)
Monocytes Absolute: 1 10*3/uL (ref 0.1–1.0)
Monocytes Relative: 11 %
Neutro Abs: 6.5 10*3/uL (ref 1.7–7.7)
Neutrophils Relative %: 74 %
Platelets: 249 10*3/uL (ref 150–400)
RBC: 2.73 MIL/uL — ABNORMAL LOW (ref 3.87–5.11)
RDW: 19.6 % — ABNORMAL HIGH (ref 11.5–15.5)
WBC: 8.9 10*3/uL (ref 4.0–10.5)
nRBC: 0 % (ref 0.0–0.2)

## 2020-06-04 LAB — COMPREHENSIVE METABOLIC PANEL
ALT: 6 U/L (ref 0–44)
AST: 7 U/L — ABNORMAL LOW (ref 15–41)
Albumin: 2 g/dL — ABNORMAL LOW (ref 3.5–5.0)
Alkaline Phosphatase: 68 U/L (ref 38–126)
Anion gap: 6 (ref 5–15)
BUN: 19 mg/dL (ref 8–23)
CO2: 18 mmol/L — ABNORMAL LOW (ref 22–32)
Calcium: 7.8 mg/dL — ABNORMAL LOW (ref 8.9–10.3)
Chloride: 114 mmol/L — ABNORMAL HIGH (ref 98–111)
Creatinine, Ser: 1.29 mg/dL — ABNORMAL HIGH (ref 0.44–1.00)
GFR calc Af Amer: 48 mL/min — ABNORMAL LOW (ref >60)
GFR calc non Af Amer: 41 mL/min — ABNORMAL LOW (ref >60)
Glucose, Bld: 92 mg/dL (ref 70–99)
Potassium: 3.3 mmol/L — ABNORMAL LOW (ref 3.5–5.1)
Sodium: 138 mmol/L (ref 135–145)
Total Bilirubin: 0.3 mg/dL (ref 0.3–1.2)
Total Protein: 4.7 g/dL — ABNORMAL LOW (ref 6.5–8.1)

## 2020-06-04 LAB — MAGNESIUM: Magnesium: 1.8 mg/dL (ref 1.7–2.4)

## 2020-06-04 NOTE — Progress Notes (Signed)
Subjective:  Patient feels much better.  She says she has had 3 or 4 bowel movements today.  They are still loose.  No melena or rectal bleeding.  Abdominal pain has resolved.  She is passing flatus.  She says her appetite is back.  Current Facility-Administered Medications:  .  0.9 % NaCl with KCl 20 mEq/ L  infusion, , Intravenous, Continuous, Johnson, Clanford L, MD, Last Rate: 70 mL/hr at 06/03/20 0601, New Bag at 06/03/20 0601 .  acetaminophen (TYLENOL) tablet 650 mg, 650 mg, Oral, Q6H PRN, 650 mg at 06/03/20 0608 **OR** acetaminophen (TYLENOL) suppository 650 mg, 650 mg, Rectal, Q6H PRN, Johnson, Clanford L, MD .  amLODipine (NORVASC) tablet 5 mg, 5 mg, Oral, Daily, Johnson, Clanford L, MD, 5 mg at 06/04/20 1055 .  atorvastatin (LIPITOR) tablet 20 mg, 20 mg, Oral, QPM, Johnson, Clanford L, MD, 20 mg at 06/03/20 1846 .  Chlorhexidine Gluconate Cloth 2 % PADS 6 each, 6 each, Topical, Daily, Johnson, Clanford L, MD, 6 each at 06/04/20 1627 .  cholecalciferol (VITAMIN D3) tablet 1,000 Units, 1,000 Units, Oral, Daily, Johnson, Clanford L, MD, 1,000 Units at 06/04/20 1049 .  diclofenac Sodium (VOLTAREN) 1 % topical gel 2 g, 2 g, Topical, QID PRN, Johnson, Clanford L, MD .  dorzolamide-timolol (COSOPT) 22.3-6.8 MG/ML ophthalmic solution 1 drop, 1 drop, Both Eyes, Daily, Johnson, Clanford L, MD, 1 drop at 06/04/20 1049 .  enoxaparin (LOVENOX) injection 30 mg, 30 mg, Subcutaneous, Q24H, Shah, Pratik D, DO, 30 mg at 06/03/20 2206 .  fentaNYL (SUBLIMAZE) injection 12.5-50 mcg, 12.5-50 mcg, Intravenous, Q2H PRN, Johnson, Clanford L, MD .  metoprolol tartrate (LOPRESSOR) injection 5 mg, 5 mg, Intravenous, Q6H PRN, Johnson, Clanford L, MD .  metroNIDAZOLE (FLAGYL) IVPB 500 mg, 500 mg, Intravenous, Q8H, Johnson, Clanford L, MD, Last Rate: 100 mL/hr at 06/04/20 1626, 500 mg at 06/04/20 1626 .  ondansetron (ZOFRAN) injection 4 mg, 4 mg, Intravenous, Q6H PRN, Johnson, Clanford L, MD .  oxyCODONE (Oxy  IR/ROXICODONE) immediate release tablet 5 mg, 5 mg, Oral, Q4H PRN, Johnson, Clanford L, MD .  pantoprazole (PROTONIX) EC tablet 40 mg, 40 mg, Oral, Q0600, Johnson, Clanford L, MD, 40 mg at 06/04/20 0414 .  potassium chloride SA (KLOR-CON) CR tablet 40 mEq, 40 mEq, Oral, BID, Manuella Ghazi, Pratik D, DO, 40 mEq at 06/04/20 1049 .  prochlorperazine (COMPAZINE) injection 10 mg, 10 mg, Intravenous, Q4H PRN, Johnson, Clanford L, MD .  saccharomyces boulardii (FLORASTOR) capsule 250 mg, 250 mg, Oral, BID, Johnson, Clanford L, MD, 250 mg at 06/04/20 1049 .  traZODone (DESYREL) tablet 25 mg, 25 mg, Oral, QHS PRN, Johnson, Clanford L, MD .  vancomycin (VANCOCIN) 50 mg/mL oral solution 125 mg, 125 mg, Oral, QID, Johnson, Clanford L, MD, 125 mg at 06/04/20 1626   Objective: Blood pressure (!) 127/57, pulse 77, temperature 97.7 F (36.5 C), temperature source Oral, resp. rate 20, height '5\' 2"'  (1.575 m), weight 61.3 kg, SpO2 97 %. Patient is alert.  She is comfortable sitting in a reclining chair She is eating supper. Abdomen is full but not distended.  Bowel sounds are hyperactive.  Percussion note is normal.  Labs/studies Results:  CBC Latest Ref Rng & Units 06/04/2020 06/03/2020 06/02/2020  WBC 4.0 - 10.5 K/uL 8.9 10.5 10.4  Hemoglobin 12.0 - 15.0 g/dL 8.0(L) 8.3(L) 9.2(L)  Hematocrit 36 - 46 % 25.8(L) 26.5(L) 30.2(L)  Platelets 150 - 400 K/uL 249 238 206    CMP Latest Ref Rng & Units 06/04/2020  06/03/2020 06/02/2020  Glucose 70 - 99 mg/dL 92 94 116(H)  BUN 8 - 23 mg/dL 19 23 24(H)  Creatinine 0.44 - 1.00 mg/dL 1.29(H) 1.55(H) 1.85(H)  Sodium 135 - 145 mmol/L 138 137 138  Potassium 3.5 - 5.1 mmol/L 3.3(L) 2.2(LL) 2.3(LL)  Chloride 98 - 111 mmol/L 114(H) 108 106  CO2 22 - 32 mmol/L 18(L) 19(L) 21(L)  Calcium 8.9 - 10.3 mg/dL 7.8(L) 7.7(L) 8.4(L)  Total Protein 6.5 - 8.1 g/dL 4.7(L) 5.0(L) 5.7(L)  Total Bilirubin 0.3 - 1.2 mg/dL 0.3 0.3 0.4  Alkaline Phos 38 - 126 U/L 68 76 85  AST 15 - 41 U/L 7(L) 9(L) 12(L)   ALT 0 - 44 U/L '6 7 9    ' Hepatic Function Latest Ref Rng & Units 06/04/2020 06/03/2020 06/02/2020  Total Protein 6.5 - 8.1 g/dL 4.7(L) 5.0(L) 5.7(L)  Albumin 3.5 - 5.0 g/dL 2.0(L) 2.1(L) 2.5(L)  AST 15 - 41 U/L 7(L) 9(L) 12(L)  ALT 0 - 44 U/L '6 7 9  ' Alk Phosphatase 38 - 126 U/L 68 76 85  Total Bilirubin 0.3 - 1.2 mg/dL 0.3 0.3 0.4  Bilirubin, Direct 0.0 - 0.2 mg/dL - - -      Assessment:  #1.  C. difficile colitis secondary to chemotherapy.  No history of antibiotic use other than surgical prophylaxis.  Significant improvement in the last 24 hours.  #2.  Ileus secondary to #1.  Nausea has resolved.  Abdomen is not distended or tender.  #3.  Anemia secondary to acute illness and chemotherapy.  No evidence of gross GI bleed but she could be losing some blood secondary to C. difficile colitis.  #4.  Malnutrition.  Serum albumin is quite low.  She should improve now that her appetite is back and she is eating.  Recommendations  Can stop metronidazole. She will continue oral vancomycin for total of 2 weeks with close follow-up and may extend duration of therapy depending on her clinical course.

## 2020-06-04 NOTE — Plan of Care (Signed)
  Problem: Acute Rehab PT Goals(only PT should resolve) Goal: Pt Will Go Supine/Side To Sit Outcome: Progressing Flowsheets (Taken 06/04/2020 1523) Pt will go Supine/Side to Sit:  Independently  with modified independence Goal: Patient Will Transfer Sit To/From Stand Outcome: Progressing Flowsheets (Taken 06/04/2020 1523) Patient will transfer sit to/from stand: with modified independence Goal: Pt Will Transfer Bed To Chair/Chair To Bed Outcome: Progressing Flowsheets (Taken 06/04/2020 1523) Pt will Transfer Bed to Chair/Chair to Bed: with modified independence Goal: Pt Will Ambulate Outcome: Progressing Flowsheets (Taken 06/04/2020 1523) Pt will Ambulate:  100 feet  with modified independence   3:24 PM, 06/04/20 Lonell Grandchild, MPT Physical Therapist with [facility] [phone] office [phone] mobile phone

## 2020-06-04 NOTE — Evaluation (Signed)
Physical Therapy Evaluation Patient Details Name: Gabrielle [name] MRN: [id number] DOB: July 01, 1946 Today's Date: 06/04/2020   History of Present Illness  Gabrielle [name] is a 74 y.o. 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  She was hospitalized earlier this month and discharge a few days ago after being admitted for nausea vomiting and diarrhea that was thought likely chemotherapy-induced.  She is status post right hemicolectomy.  She had an acute kidney injury during last admission.  She returned to the emergency department today because she continues to have nonbloody diarrhea.  The frequency has increased.  She had 5 episodes of diarrhea yesterday and continues to have uncontrolled diarrhea.  She also has nausea and vomiting.  She has some abdominal distention but no significant abdominal pain.  She has been taking Lomotil with no improvement in her diarrhea.  Her last known antibiotic was given on 6/14 prior to Port-A-Cath placement where she received a dose of cefazolin.  Her other past medical history as detailed below.    Clinical Impression  Patient functioning near baseline for functional mobility and gait, slightly unsteady on feet with occasional reaching for nearby objects for support, after verbal cues to let arms swing, balance improved requiring less tactile cueing, and limited for ambulation due to fatigue.  Patient tolerated sitting up in chair after therapy.  Patient will benefit from continued physical therapy in hospital and recommended venue below to increase strength, balance, endurance for safe ADLs and gait.      Follow Up Recommendations Home health PT;Supervision for mobility/OOB;Supervision - Intermittent    Equipment Recommendations  None recommended by PT    Recommendations for Other Services       Precautions / Restrictions Precautions Precautions:  Fall Restrictions Weight Bearing Restrictions: No      Mobility  Bed Mobility Overal bed mobility: Needs Assistance Bed Mobility: Supine to Sit     Supine to sit: Modified independent (Device/Increase time);Supervision     General bed mobility comments: slightly increased time  Transfers Overall transfer level: Needs assistance Equipment used: None Transfers: Sit to/from Stand;Stand Pivot Transfers Sit to Stand: Supervision Stand pivot transfers: Supervision;Min guard       General transfer comment: slightly unsteady  Ambulation/Gait Ambulation/Gait assistance: Supervision;Min guard Gait Distance (Feet): 80 Feet Assistive device: None;1 person hand held assist Gait Pattern/deviations: Decreased step length - right;Decreased step length - left;Decreased stride length;Staggering left;Staggering right Gait velocity: decreased   General Gait Details: slow labored cadence with occasional staggering right/left requiring tactile cueing to maintain balance, decreased arm swing, limited secondary to fatigue  Stairs            Wheelchair Mobility    Modified Rankin (Stroke Patients Only)       Balance Overall balance assessment: Mild deficits observed, not formally tested                                           Pertinent Vitals/Pain Pain Assessment: No/denies pain    Home Living Family/patient expects to be discharged to:: Private residence Living Arrangements: Children (living at her daughter's house) Available Help at Discharge: Family;Available 24 hours/day Type of Home: Mobile home Home Access: Stairs to enter Entrance Stairs-Rails: Right;Left;Can reach both Entrance Stairs-Number of Steps: 4 Home Layout: One level Home Equipment: Walker - 4 wheels;Bedside commode  Prior Function Level of Independence: Needs assistance;Independent with assistive device(s)   Gait / Transfers Assistance Needed: household ambulator without  AD  ADL's / Homemaking Assistance Needed: assisted by family        Hand Dominance   Dominant Hand: Right    Extremity/Trunk Assessment   Upper Extremity Assessment Upper Extremity Assessment: Generalized weakness    Lower Extremity Assessment Lower Extremity Assessment: Generalized weakness    Cervical / Trunk Assessment Cervical / Trunk Assessment: Normal  Communication   Communication: HOH  Cognition Arousal/Alertness: Awake/alert Behavior During Therapy: WFL for tasks assessed/performed Overall Cognitive Status: Within Functional Limits for tasks assessed                                        General Comments      Exercises     Assessment/Plan    PT Assessment Patient needs continued PT services  PT Problem List Decreased strength;Decreased activity tolerance;Decreased balance;Decreased mobility       PT Treatment Interventions Balance training;Gait training;Stair training;Functional mobility training;Therapeutic activities;Therapeutic exercise;Patient/family education    PT Goals (Current goals can be found in the Care Plan section)  Acute Rehab PT Goals Patient Stated Goal: return home with family to assist PT Goal Formulation: With patient Time For Goal Achievement: 06/08/20 Potential to Achieve Goals: Good    Frequency Min 3X/week   Barriers to discharge        Co-evaluation               AM-PAC PT "6 Clicks" Mobility  Outcome Measure Help needed turning from your back to your side while in a flat bed without using bedrails?: None Help needed moving from lying on your back to sitting on the side of a flat bed without using bedrails?: A Little Help needed moving to and from a bed to a chair (including a wheelchair)?: A Little Help needed standing up from a chair using your arms (e.g., wheelchair or bedside chair)?: A Little Help needed to walk in hospital room?: A Little Help needed climbing 3-5 steps with a railing? :  A Little 6 Click Score: 19    End of Session   Activity Tolerance: Patient tolerated treatment well;Patient limited by fatigue Patient left: in chair;with call bell/phone within reach Nurse Communication: Mobility status PT Visit Diagnosis: Unsteadiness on feet (R26.81);Other abnormalities of gait and mobility (R26.89);Muscle weakness (generalized) (M62.81)    Time: 4536-4680 PT Time Calculation (min) (ACUTE ONLY): 23 min   Charges:   PT Evaluation $PT Eval Moderate Complexity: 1 Mod PT Treatments $Therapeutic Activity: 23-37 mins        3:22 PM, 06/04/20 Lonell Grandchild, MPT Physical Therapist with [facility] [phone] office [phone] mobile phone

## 2020-06-04 NOTE — Progress Notes (Signed)
PROGRESS NOTE    Gabrielle [name]  JME:[id number] DOB: 1946/10/23 DOA: 06/02/2020 PCP: Janora Norlander, DO   Brief Narrative:  Per HPI: Gabrielle [name] a 74 y.o.femalewith stage IIIb poorly differentiated right colon adenocarcinoma recently started chemotherapy after her port was placed earlier this month. She was hospitalized earlier this month and discharge a few days ago after being admitted for nausea vomiting and diarrhea that was thought likely chemotherapy-induced. She is status post right hemicolectomy. She had an acute kidney injury during last admission. She returned to the emergency department today because she continues to have nonbloody diarrhea. The frequency has increased. She had 5 episodes of diarrhea yesterday and continues to have uncontrolled diarrhea. She also has nausea and vomiting. She has some abdominal distention but no significant abdominal pain. She has been taking Lomotil with no improvement in her diarrhea. Her last known antibiotic was given on 6/14 prior to Port-A-Cath placement where she received a dose of cefazolin. Her other past medical history as detailed below.   Assessment & Plan:   Principal Problem:   Enterocolitis due to Clostridioides difficile Active Problems:   Essential hypertension   Stage 3 chronic kidney disease   Anemia   Macular degeneration   Malignant neoplasm of colon (HCC)   Hypokalemia   Bladder wall thickening   Iron deficiency anemia due to chronic blood loss   Acute kidney injury ([place])   DNR (do not resuscitate)   Diarrhea associated with pseudomembranous colitis   1. Severe C difficile enterocolitis - with concern for partial small bowel obstruction - treating for severe disease with oral vancomcyin and IV metronidazole. Continue supportive care with IV fluids.  Diet advanced to full liquid by GI, plan to advance to soft today.  Appreciate consultation given the complexity of this case. 2. Ileus / Partial  SBO -continued treatment for C. difficile.  Advance diet to soft today. 3. Essential hypertension - resumed home meds, follow.  Currently controlled. 4. Hypokalemia -continues to slowly improve.  IV and oral repletion ordered.  Recheck in a.m.  Magnesium within normal limits. 5. Bladder wall thickening - chronic finding, no signs or symptoms of cystitis.  6. AKI on stage 3b CKD - continue IV fluid hydration.  Improving. 7. Iron deficiency anemia -hold iron supplementation for now. 8. Diarrhea associated with C diff - hold lomotil for now, treat C diff aggressively.  9. Stage IIIb poorly differentiated right colon adenocarcinoma - Pt recently had port placed and started on chemotherapy with plans to give every 14 days. Follow up with Dr. Delton Coombes. 10. DNR present on admission - continue in hospital.   DVT prophylaxis:Lovenox Code Status: Full Family Communication: Tried calling daughter with no response. Disposition Plan:   Status is: Inpatient  Remains inpatient appropriate because:IV treatments appropriate due to intensity of illness or inability to take PO and Inpatient level of care appropriate due to severity of illness   Dispo: The patient is from: Home  Anticipated d/c is to: Home  Anticipated d/c date is: 1-2 days  Patient currently is not medically stable to d/c.  Consultants:   GI  Procedures:   See below  Antimicrobials:  Anti-infectives (From admission, onward)   Start     Dose/Rate Route Frequency Ordered Stop   06/02/20 1400  vancomycin (VANCOCIN) 50 mg/mL oral solution 125 mg     Discontinue     125 mg Oral 4 times daily 06/02/20 1334 06/12/20 1359   06/02/20 1400  metroNIDAZOLE (FLAGYL) IVPB  500 mg     Discontinue     500 mg 100 mL/hr over 60 Minutes Intravenous Every 8 hours 06/02/20 1347         Subjective: Patient seen and evaluated today with no new acute complaints or concerns. No acute concerns or  events noted overnight.  She continues to have some loose, liquidy stools, but is tolerating her full liquid diet.  Objective: Vitals:   06/03/20 2247 06/04/20 0424 06/04/20 0511 06/04/20 1055  BP: (!) 128/51  (!) 128/48 (!) 127/57  Pulse: 78  77   Resp: 20  20   Temp: 98.6 F (37 C)  97.7 F (36.5 C)   TempSrc: Oral  Oral   SpO2: 96%  97%   Weight:  61.3 kg    Height:        Intake/Output Summary (Last 24 hours) at 06/04/2020 1319 Last data filed at 06/04/2020 0221 Gross per 24 hour  Intake 1382.4 ml  Output --  Net 1382.4 ml   Filed Weights   06/02/20 0902 06/03/20 0500 06/04/20 0424  Weight: 66.1 kg 61.4 kg 61.3 kg    Examination:  General exam: Appears calm and comfortable  Respiratory system: Clear to auscultation. Respiratory effort normal. Cardiovascular system: S1 & S2 heard, RRR. No JVD, murmurs, rubs, gallops or clicks. No pedal edema. Gastrointestinal system: Abdomen is nondistended, soft and nontender. No organomegaly or masses felt. Normal bowel sounds heard. Central nervous system: Alert and oriented. No focal neurological deficits. Extremities: Symmetric 5 x 5 power. Skin: No rashes, lesions or ulcers Psychiatry: Judgement and insight appear normal. Mood & affect appropriate.     Data Reviewed: I have personally reviewed following labs and imaging studies  CBC: Recent Labs  Lab 05/30/20 0347 06/02/20 0933 06/03/20 0553 06/04/20 0432  WBC 2.5* 10.4 10.5 8.9  NEUTROABS  --  8.5* 8.1* 6.5  HGB 9.1* 9.2* 8.3* 8.0*  HCT 30.1* 30.2* 26.5* 25.8*  MCV 96.2 95.6 94.0 94.5  PLT 128* 206 238 324   Basic Metabolic Panel: Recent Labs  Lab 05/29/20 0413 05/30/20 0347 06/02/20 0933 06/03/20 0553 06/04/20 0432  NA 134* 137 138 137 138  K 3.3* 3.3* 2.3* 2.2* 3.3*  CL 106 109 106 108 114*  CO2 21* 18* 21* 19* 18*  GLUCOSE 103* 115* 116* 94 92  BUN 28* 20 24* 23 19  CREATININE 1.64* 1.20* 1.85* 1.55* 1.29*  CALCIUM 7.0* 8.3* 8.4* 7.7* 7.8*  MG 1.6*  1.9 1.8 1.7 1.8   GFR: Estimated Creatinine Clearance: 33.5 mL/min (A) (by C-G formula based on SCr of 1.29 mg/dL (H)). Liver Function Tests: Recent Labs  Lab 06/02/20 0933 06/03/20 0553 06/04/20 0432  AST 12* 9* 7*  ALT 9 7 6   ALKPHOS 85 76 68  BILITOT 0.4 0.3 0.3  PROT 5.7* 5.0* 4.7*  ALBUMIN 2.5* 2.1* 2.0*   No results for input(s): LIPASE, AMYLASE in the last 168 hours. No results for input(s): AMMONIA in the last 168 hours. Coagulation Profile: No results for input(s): INR, PROTIME in the last 168 hours. Cardiac Enzymes: No results for input(s): CKTOTAL, CKMB, CKMBINDEX, TROPONINI in the last 168 hours. BNP (last 3 results) No results for input(s): PROBNP in the last 8760 hours. HbA1C: No results for input(s): HGBA1C in the last 72 hours. CBG: No results for input(s): GLUCAP in the last 168 hours. Lipid Profile: No results for input(s): CHOL, HDL, LDLCALC, TRIG, CHOLHDL, LDLDIRECT in the last 72 hours. Thyroid Function Tests: No results for  input(s): TSH, T4TOTAL, FREET4, T3FREE, THYROIDAB in the last 72 hours. Anemia Panel: No results for input(s): VITAMINB12, FOLATE, FERRITIN, TIBC, IRON, RETICCTPCT in the last 72 hours. Sepsis Labs: No results for input(s): PROCALCITON, LATICACIDVEN in the last 168 hours.  Recent Results (from the past 240 hour(s))  SARS Coronavirus 2 by RT PCR (hospital order, performed in [facility] hospital lab) Nasopharyngeal Nasopharyngeal Swab     Status: None   Collection Time: 05/28/20  1:57 AM   Specimen: Nasopharyngeal Swab  Result Value Ref Range Status   SARS Coronavirus 2 NEGATIVE NEGATIVE Final    Comment: (NOTE) SARS-CoV-2 target nucleic acids are NOT DETECTED.  The SARS-CoV-2 RNA is generally detectable in upper and lower respiratory specimens during the acute phase of infection. The lowest concentration of SARS-CoV-2 viral copies this assay can detect is 250 copies / mL. A negative result does not preclude SARS-CoV-2  infection and should not be used as the sole basis for treatment or other patient management decisions.  A negative result may occur with improper specimen collection / handling, submission of specimen other than nasopharyngeal swab, presence of viral mutation(s) within the areas targeted by this assay, and inadequate number of viral copies (<250 copies / mL). A negative result must be combined with clinical observations, patient history, and epidemiological information.  Fact Sheet for Patients:   StrictlyIdeas.no  Fact Sheet for Healthcare Providers: BankingDealers.co.za  This test is not yet approved or  cleared by the [place] FDA and has been authorized for detection and/or diagnosis of SARS-CoV-2 by FDA under an Emergency Use Authorization (EUA).  This EUA will remain in effect (meaning this test can be used) for the duration of the COVID-19 declaration under Section 564(b)(1) of the Act, 21 U.S.C. section 360bbb-3(b)(1), unless the authorization is terminated or revoked sooner.  Performed at [facility], [address], [place], [place] [address]   MRSA PCR Screening     Status: None   Collection Time: 05/28/20  2:32 PM   Specimen: Nasal Mucosa; Nasopharyngeal  Result Value Ref Range Status   MRSA by PCR NEGATIVE NEGATIVE Final    Comment:        The GeneXpert MRSA Assay (FDA approved for NASAL specimens only), is one component of a comprehensive MRSA colonization surveillance program. It is not intended to diagnose MRSA infection nor to guide or monitor treatment for MRSA infections. Performed at [facility], [address], [place], [place] [address]   Gastrointestinal Panel by PCR , Stool     Status: None   Collection Time: 06/02/20 11:26 AM   Specimen: Stool  Result Value Ref Range Status   Campylobacter species NOT DETECTED NOT DETECTED Final   Plesimonas shigelloides NOT DETECTED NOT DETECTED Final    Salmonella species NOT DETECTED NOT DETECTED Final   Yersinia enterocolitica NOT DETECTED NOT DETECTED Final   Vibrio species NOT DETECTED NOT DETECTED Final   Vibrio cholerae NOT DETECTED NOT DETECTED Final   Enteroaggregative E coli (EAEC) NOT DETECTED NOT DETECTED Final   Enteropathogenic E coli (EPEC) NOT DETECTED NOT DETECTED Final   Enterotoxigenic E coli (ETEC) NOT DETECTED NOT DETECTED Final   Shiga like toxin producing E coli (STEC) NOT DETECTED NOT DETECTED Final   Shigella/Enteroinvasive E coli (EIEC) NOT DETECTED NOT DETECTED Final   Cryptosporidium NOT DETECTED NOT DETECTED Final   Cyclospora cayetanensis NOT DETECTED NOT DETECTED Final   Entamoeba histolytica NOT DETECTED NOT DETECTED Final   Giardia lamblia NOT DETECTED NOT DETECTED  Final   Adenovirus F40/41 NOT DETECTED NOT DETECTED Final   Astrovirus NOT DETECTED NOT DETECTED Final   Norovirus GI/GII NOT DETECTED NOT DETECTED Final   Rotavirus A NOT DETECTED NOT DETECTED Final   Sapovirus (I, II, IV, and V) NOT DETECTED NOT DETECTED Final    Comment: Performed at [facility], [address] [place], [place] [address]  C Difficile Quick Screen w PCR reflex     Status: Abnormal   Collection Time: 06/02/20 11:26 AM   Specimen: Stool  Result Value Ref Range Status   C Diff antigen POSITIVE (A) NEGATIVE Final   C Diff toxin POSITIVE (A) NEGATIVE Final   C Diff interpretation Toxin producing C. difficile detected.  Final    Comment: CRITICAL RESULT CALLED TO, READ BACK BY AND VERIFIED WITH: OSBORNE T. @ 1217 ON [id number] BY HENDERSON L. Performed at [facility], [address], [place], [place] [address]   SARS Coronavirus 2 by RT PCR (hospital order, performed in [facility] hospital lab) Nasopharyngeal Nasopharyngeal Swab     Status: None   Collection Time: 06/02/20  1:18 PM   Specimen: Nasopharyngeal Swab  Result Value Ref Range Status   SARS Coronavirus 2 NEGATIVE NEGATIVE Final    Comment:  (NOTE) SARS-CoV-2 target nucleic acids are NOT DETECTED.  The SARS-CoV-2 RNA is generally detectable in upper and lower respiratory specimens during the acute phase of infection. The lowest concentration of SARS-CoV-2 viral copies this assay can detect is 250 copies / mL. A negative result does not preclude SARS-CoV-2 infection and should not be used as the sole basis for treatment or other patient management decisions.  A negative result may occur with improper specimen collection / handling, submission of specimen other than nasopharyngeal swab, presence of viral mutation(s) within the areas targeted by this assay, and inadequate number of viral copies (<250 copies / mL). A negative result must be combined with clinical observations, patient history, and epidemiological information.  Fact Sheet for Patients:   StrictlyIdeas.no  Fact Sheet for Healthcare Providers: BankingDealers.co.za  This test is not yet approved or  cleared by the [place] FDA and has been authorized for detection and/or diagnosis of SARS-CoV-2 by FDA under an Emergency Use Authorization (EUA).  This EUA will remain in effect (meaning this test can be used) for the duration of the COVID-19 declaration under Section 564(b)(1) of the Act, 21 U.S.C. section 360bbb-3(b)(1), unless the authorization is terminated or revoked sooner.  Performed at [facility], [address] [place], [place] [address]          Radiology Studies: No results found.      Scheduled Meds: . amLODipine  5 mg Oral Daily  . atorvastatin  20 mg Oral QPM  . Chlorhexidine Gluconate Cloth  6 each Topical Daily  . cholecalciferol  1,000 Units Oral Daily  . dorzolamide-timolol  1 drop Both Eyes Daily  . enoxaparin (LOVENOX) injection  30 mg Subcutaneous Q24H  . pantoprazole  40 mg Oral Q0600  . potassium chloride  40 mEq Oral BID  . saccharomyces boulardii  250 mg Oral BID  .  vancomycin  125 mg Oral QID   Continuous Infusions: . 0.9 % NaCl with KCl 20 mEq / L 70 mL/hr at 06/03/20 0601  . metronidazole 500 mg (06/04/20 0414)     LOS: 2 days    Time spent: 30 minutes    Bertil Brickey D Manuella Ghazi, DO [facility]  If 7PM-7AM, please contact night-coverage [email or web address]  06/04/2020, 1:19 PM

## 2020-06-05 ENCOUNTER — Telehealth: Payer: Self-pay

## 2020-06-05 ENCOUNTER — Telehealth: Payer: Self-pay | Admitting: Nurse Practitioner

## 2020-06-05 ENCOUNTER — Telehealth: Payer: Self-pay | Admitting: *Deleted

## 2020-06-05 LAB — COMPREHENSIVE METABOLIC PANEL
ALT: 5 U/L (ref 0–44)
AST: 8 U/L — ABNORMAL LOW (ref 15–41)
Albumin: 2 g/dL — ABNORMAL LOW (ref 3.5–5.0)
Alkaline Phosphatase: 59 U/L (ref 38–126)
Anion gap: 5 (ref 5–15)
BUN: 15 mg/dL (ref 8–23)
CO2: 16 mmol/L — ABNORMAL LOW (ref 22–32)
Calcium: 7.5 mg/dL — ABNORMAL LOW (ref 8.9–10.3)
Chloride: 116 mmol/L — ABNORMAL HIGH (ref 98–111)
Creatinine, Ser: 1.05 mg/dL — ABNORMAL HIGH (ref 0.44–1.00)
GFR calc Af Amer: >60 mL/min (ref >60)
GFR calc non Af Amer: 53 mL/min — ABNORMAL LOW (ref >60)
Glucose, Bld: 110 mg/dL — ABNORMAL HIGH (ref 70–99)
Potassium: 3.4 mmol/L — ABNORMAL LOW (ref 3.5–5.1)
Sodium: 137 mmol/L (ref 135–145)
Total Bilirubin: 0.3 mg/dL (ref 0.3–1.2)
Total Protein: 4.5 g/dL — ABNORMAL LOW (ref 6.5–8.1)

## 2020-06-05 LAB — CBC WITH DIFFERENTIAL/PLATELET
Abs Immature Granulocytes: 0.35 10*3/uL — ABNORMAL HIGH (ref 0.00–0.07)
Basophils Absolute: 0 10*3/uL (ref 0.0–0.1)
Basophils Relative: 0 %
Eosinophils Absolute: 0.1 10*3/uL (ref 0.0–0.5)
Eosinophils Relative: 1 %
HCT: 28.4 % — ABNORMAL LOW (ref 36.0–46.0)
Hemoglobin: 8.5 g/dL — ABNORMAL LOW (ref 12.0–15.0)
Immature Granulocytes: 5 %
Lymphocytes Relative: 16 %
Lymphs Abs: 1.1 10*3/uL (ref 0.7–4.0)
MCH: 28.9 pg (ref 26.0–34.0)
MCHC: 29.9 g/dL — ABNORMAL LOW (ref 30.0–36.0)
MCV: 96.6 fL (ref 80.0–100.0)
Monocytes Absolute: 0.8 10*3/uL (ref 0.1–1.0)
Monocytes Relative: 12 %
Neutro Abs: 4.6 10*3/uL (ref 1.7–7.7)
Neutrophils Relative %: 66 %
Platelets: 232 10*3/uL (ref 150–400)
RBC: 2.94 MIL/uL — ABNORMAL LOW (ref 3.87–5.11)
RDW: 19.9 % — ABNORMAL HIGH (ref 11.5–15.5)
WBC: 7 10*3/uL (ref 4.0–10.5)
nRBC: 0 % (ref 0.0–0.2)

## 2020-06-05 LAB — MAGNESIUM: Magnesium: 1.6 mg/dL — ABNORMAL LOW (ref 1.7–2.4)

## 2020-06-05 MED ORDER — HEPARIN SOD (PORK) LOCK FLUSH 100 UNIT/ML IV SOLN
500.0000 [IU] | INTRAVENOUS | Status: AC | PRN
  Administered 2020-06-05: 500 [IU]
  Filled 2020-06-05: qty 5

## 2020-06-05 MED ORDER — SACCHAROMYCES BOULARDII 250 MG PO CAPS: 250.0000 mg | ORAL_CAPSULE | Freq: Two times a day (BID) | ORAL | 2 refills | Status: AC

## 2020-06-05 MED ORDER — POTASSIUM CHLORIDE CRYS ER 20 MEQ PO TBCR: 20.0000 meq | EXTENDED_RELEASE_TABLET | Freq: Every day | ORAL | 1 refills | Status: DC

## 2020-06-05 MED ORDER — POTASSIUM CHLORIDE CRYS ER 20 MEQ PO TBCR: 40.0000 meq | EXTENDED_RELEASE_TABLET | Freq: Two times a day (BID) | ORAL | 0 refills | Status: DC

## 2020-06-05 MED ORDER — ENOXAPARIN SODIUM 40 MG/0.4ML ~~LOC~~ SOLN: 40.0000 mg | SUBCUTANEOUS | Status: DC

## 2020-06-05 MED ORDER — MAGNESIUM SULFATE 2 GM/50ML IV SOLN
2.0000 g | Freq: Once | INTRAVENOUS | Status: AC
  Administered 2020-06-05: 2 g via INTRAVENOUS
  Filled 2020-06-05: qty 50

## 2020-06-05 MED ORDER — VANCOMYCIN HCL 125 MG PO CAPS: 125.0000 mg | ORAL_CAPSULE | Freq: Four times a day (QID) | ORAL | 0 refills | Status: AC

## 2020-06-05 NOTE — Telephone Encounter (Signed)
Yes, this was already signed. It came across my desk. But ok to give verbal

## 2020-06-05 NOTE — Discharge Summary (Signed)
Physician Discharge Summary  Gabrielle [name] EXH:[id number] DOB: 07-30-1946 DOA: 06/02/2020  PCP: Janora Norlander, DO  Admit date: 06/02/2020  Discharge date: 06/05/2020  Admitted From:Home  Disposition:  Home  Recommendations for Outpatient Follow-up:  1. Follow up with PCP in 1-2 weeks 2. Please obtain BMP/CBC in one week 3. Continue on potassium as prescribed at 40 mEq twice daily for the next 7 days and then resume 20 mEq daily thereafter 4. Continue on vancomycin as prescribed with 125 mg 4 times daily for 12 more days to complete total 14-day course of treatment 5. Continue on Florastor as prescribed 6. Follow-up with gastroenterology Dr. Laural Golden in 2 weeks for further evaluation and management. 7. Continue chemotherapy appointments as prior  Home Health: Yes with PT  Equipment/Devices: None  Discharge Condition: Stable  CODE STATUS: DNR  Diet recommendation: Heart Healthy  Brief/Interim Summary: Per HPI: Gabrielle Cottam Snideris a 74 y.o.femalewith stage IIIb poorly differentiated right colon adenocarcinoma recently started chemotherapy after her port was placed earlier this month. She was hospitalized earlier this month and discharge a few days ago after being admitted for nausea vomiting and diarrhea that was thought likely chemotherapy-induced. She is status post right hemicolectomy. She had an acute kidney injury during last admission. She returned to the emergency department today because she continues to have nonbloody diarrhea. The frequency has increased. She had 5 episodes of diarrhea yesterday and continues to have uncontrolled diarrhea. She also has nausea and vomiting. She has some abdominal distention but no significant abdominal pain. She has been taking Lomotil with no improvement in her diarrhea. Her last known antibiotic was given on 6/14 prior to Port-A-Cath placement where she received a dose of cefazolin. Her other past medical history as detailed  below.  1. Severe C difficile enterocolitis - with concern for partial small bowel obstruction -she is now tolerating diet and has had significant improvement in her abdominal pain as well as stool frequency.  She was treated with IV Flagyl as well as oral vancomycin while here, but is stable for discharge on oral vancomycin for total 2-week course with close follow-up to GI in the outpatient setting.  Symptoms have dramatically improved. 2. Ileus / Partial SBO -resolved. 3. Essential hypertension - resumedhome meds, follow. Currently controlled. 4. Hypokalemia -continues to slowly improve. Continue aggressive oral repletion at home and recheck BMP in outpatient setting. 5. Bladder wall thickening - chronic finding, no signs or symptoms of cystitis.  6. AKI on stage 3b CKD -resolved 7. Iron deficiency anemia -May resume home iron supplementation 8. Diarrhea associated with C diff - hold lomotil for now, treat C diff ongoing with oral vancomycin 9. Stage IIIb poorly differentiated right colon adenocarcinoma - Pt recently had port placed and started on chemotherapy with plans to give every 14 days. Follow up with Dr. Delton Coombes. 10. DNR present on admission   Discharge Diagnoses:  Principal Problem:   Enterocolitis due to Clostridioides difficile Active Problems:   Essential hypertension   Stage 3 chronic kidney disease   Anemia   Macular degeneration   Malignant neoplasm of colon (HCC)   Hypokalemia   Bladder wall thickening   Iron deficiency anemia due to chronic blood loss   Acute kidney injury ([place])   DNR (do not resuscitate)   Diarrhea associated with pseudomembranous colitis    Discharge Instructions  Discharge Instructions    Diet - low sodium heart healthy   Complete by: As directed    Increase activity slowly  Complete by: As directed    No wound care   Complete by: As directed      Allergies as of 06/05/2020      Reactions   Lisinopril Cough       Medication List    STOP taking these medications   diphenoxylate-atropine 2.5-0.025 MG tablet Commonly known as: Lomotil     TAKE these medications   acetaminophen 500 MG tablet Commonly known as: TYLENOL Take 1,000 mg by mouth every 6 (six) hours as needed for moderate pain.   alendronate 70 MG tablet Commonly known as: FOSAMAX Take 1 tablet (70 mg total) by mouth every 7 (seven) days. Take with a full glass of water on an empty stomach.   amLODipine 5 MG tablet Commonly known as: NORVASC Take 1 tablet (5 mg total) by mouth daily.   atorvastatin 20 MG tablet Commonly known as: LIPITOR Take 1 tablet (20 mg total) by mouth daily.   cholecalciferol 25 MCG (1000 UNIT) tablet Commonly known as: VITAMIN D3 Take 1,000 Units by mouth daily.   diclofenac Sodium 1 % Gel Commonly known as: VOLTAREN Apply 2 g topically 4 (four) times daily.   dorzolamide-timolol 22.3-6.8 MG/ML ophthalmic solution Commonly known as: COSOPT Place 1 drop into both eyes daily.   ferrous sulfate 325 (65 FE) MG EC tablet Take 1 tablet (325 mg total) by mouth 2 (two) times daily with a meal.   fluorouracil CALGB 02774 in sodium chloride 0.9 % 150 mL Inject into the vein over 48 hr.   FLUOROURACIL IV Inject into the vein every 14 (fourteen) days.   LEUCOVORIN CALCIUM IV Inject into the vein every 14 (fourteen) days.   lidocaine-prilocaine cream Commonly known as: EMLA Apply a small amount to port a cath site and cover with plastic wrap 1 hour prior to chemotherapy appointments   OXALIPLATIN IV Inject into the vein every 14 (fourteen) days.   potassium chloride SA 20 MEQ tablet Commonly known as: KLOR-CON Take 2 tablets (40 mEq total) by mouth 2 (two) times daily for 7 days. What changed: You were already taking a medication with the same name, and this prescription was added. Make sure you understand how and when to take each.   potassium chloride SA 20 MEQ tablet Commonly known as:  KLOR-CON Take 1 tablet (20 mEq total) by mouth daily. Start taking on: June 13, 2020 What changed: These instructions start on June 13, 2020. If you are unsure what to do until then, ask your doctor or other care provider.   prochlorperazine 10 MG tablet Commonly known as: COMPAZINE Take 1 tablet (10 mg total) by mouth every 6 (six) hours as needed for nausea or vomiting.   saccharomyces boulardii 250 MG capsule Commonly known as: FLORASTOR Take 1 capsule (250 mg total) by mouth 2 (two) times daily.   vancomycin 125 MG capsule Commonly known as: VANCOCIN Take 1 capsule (125 mg total) by mouth 4 (four) times daily for 12 days.       Follow-up Information    Ronnie Doss M, DO Follow up in 2 week(s).   Specialty: Family Medicine Contact information: [place] [address] [phone]        Rogene Houston, MD Follow up in 2 week(s).   Specialty: Gastroenterology Contact information: [address], [address] [place] [address] [phone]              Allergies  Allergen Reactions  . Lisinopril Cough  Consultations:  GI   Procedures/Studies: CT ABDOMEN PELVIS WO CONTRAST  Result Date: 06/02/2020 CLINICAL DATA:  Vomiting and diarrhea. Ascending colon cancer, prior right hemicolectomy. EXAM: CT ABDOMEN AND PELVIS WITHOUT CONTRAST TECHNIQUE: Multidetector CT imaging of the abdomen and pelvis was performed following the standard protocol without IV contrast. COMPARISON:  PET-CT 05/11/2020 and CT abdomen from 04/06/2020 FINDINGS: Lower chest: Descending thoracic aortic atherosclerotic calcification. Mild mitral and aortic valve calcification. Low-density blood pool raise the possibility of anemia. Small right and trace left pleural effusion. Mild cardiomegaly. Bandlike density in the left lower lobe favoring atelectasis. Left lower lobe nodule 0.5 cm in diameter on image 14/4, stable. Subtle nodularity medially in the right lower lobe on images  29-34 of series 4, nonspecific. There is some faint geographic ground-glass opacities in the lower lobes Hepatobiliary: Borderline dilated gallbladder. No significant extrahepatic biliary dilatation. No obvious noncontrast CT abnormality of the hepatic parenchyma. Pancreas: Unremarkable Spleen: Unremarkable Adrenals/Urinary Tract: Both adrenal glands appear normal. Hyperdense peripheral 6 mm lesion of the right kidney upper pole on image 33/2 is stable and probably a complex cyst but technically nonspecific. Other hypodense bilateral renal lesions are probably cysts. No hydronephrosis or hydroureter. There is diffuse urinary bladder wall thickening which could reflect cystitis but was also present on the prior PET-CT from 05/11/2020. Stomach/Bowel: Right hemicolectomy with diffuse wall thickening in the remaining colon especially the descending colon and sigmoid colon, with air-fluid levels. There is some mildly dilated loops of small bowel in the abdomen along with some additional borderline dilated loops. A few small segments of small-bowel are not particularly dilated. There scattered air-fluid levels in the small bowel loops no specific lead point for obstruction identified. No pneumatosis or compelling findings of breakdown of the coloenteric anastomotic site in the right abdomen. Vascular/Lymphatic: Aortoiliac atherosclerotic vascular disease. No pathologic adenopathy. Reproductive: Unremarkable Other: New mild ascites, most notable along the paracolic gutters and in the perihepatic and perisplenic regions. New mesenteric edema. Musculoskeletal: Thoracic and lumbar spondylosis. Mid and lower lumbar degenerative disc disease. IMPRESSION: 1. Diffuse wall thickening in the remaining colon especially the descending colon and sigmoid colon, with air-fluid levels. There is some mildly dilated loops of small bowel in the abdomen along with scattered air-fluid levels. No specific lead point for obstruction identified.  Appearance suggests enterocolitis potentially with ileus. Given the thickening in the distal colon, consider testing for C difficile toxin. Strictly speaking on today's noncontrast examination I cannot exclude ischemia, but no pneumatosis is observed. 2. New mild ascites and mesenteric edema. 3. Small right and trace left pleural effusions. 4. Low-density blood pool raise the possibility of anemia. 5. Mild cardiomegaly. Mild mitral and aortic valve calcification. 6. Stable 0.5 cm in diameter left lower lobe pulmonary nodule, nonspecific. 7. Hyperdense peripheral 6 mm lesion of the right kidney upper pole is stable and probably a complex cyst but technically nonspecific. Other hypodense bilateral renal lesions are probably cysts. 8. Subtle nodularity medially in the right lower lobe on images 29-34 of series 4, nonspecific. 9. Diffuse urinary bladder wall thickening could reflect cystitis but was also present on the prior PET-CT from 05/11/2020. 10. Aortic atherosclerosis. Aortic Atherosclerosis (ICD10-I70.0). Electronically Signed   By: Van Clines M.D.   On: 06/02/2020 12:49   DG Chest 1 View  Result Date: 05/18/2020 CLINICAL DATA:  Port-A-Cath placement.  Colon carcinoma. EXAM: CHEST  1 VIEW COMPARISON:  PET-CT May 12, 2020 FINDINGS: Port-A-Cath tip is in the superior vena cava. No pneumothorax. There is  mild bibasilar atelectasis. Lungs otherwise are clear. Heart size and pulmonary vascularity are normal. No adenopathy. There is degenerative change in the thoracic spine. There is aortic atherosclerosis. IMPRESSION: Port-A-Cath tip in superior vena cava. No pneumothorax. Slight bibasilar atelectasis. Lungs otherwise clear. Heart size normal. Aortic Atherosclerosis (ICD10-I70.0). Electronically Signed   By: Lowella Grip III M.D.   On: 05/18/2020 09:26   NM PET Image Initial (PI) Skull Base To Thigh  Result Date: 05/12/2020 CLINICAL DATA:  Initial treatment strategy for colon cancer. EXAM: NUCLEAR  MEDICINE PET SKULL BASE TO THIGH TECHNIQUE: 7.27 mCi F-18 FDG was injected intravenously. Full-ring PET imaging was performed from the skull base to thigh after the radiotracer. CT data was obtained and used for attenuation correction and anatomic localization. Fasting blood glucose: 85 mg/dl COMPARISON:  04/06/2020 FINDINGS: Mediastinal blood pool activity: SUV max 2.81 Liver activity: SUV max NA NECK: No hypermetabolic lymph nodes in the neck. Incidental CT findings: none CHEST: No hypermetabolic mediastinal or hilar nodes. 4 mm LEFT lower lobe pulmonary nodule 4 mm (image 101, series 3) area not imaged on previous assessment. Area not associated with increased metabolic activity there with limited evaluation given the size. Additional nodule at the LEFT lung base measuring approximately 6 mm (image 108, series 3) and a third nodule at the RIGHT lower lobe approximately 6 mm uptake below blood pool, remaining nonspecific. Incidental CT findings: Calcified coronary artery disease. Mild cardiac enlargement. Mitral annular calcification. Extensive calcified atheromatous plaque of the thoracic aorta. Limited assessment of heart and great vessels in the chest without contrast. No signs of consolidation or evidence of pleural effusion in the chest. ABDOMEN/PELVIS: Focus of increased FDG uptake along the anastomotic suture line of the ileocolonic anastomosis following RIGHT hemicolectomy. Mild thickening of the colon in this location (image 162, series 3) 9 mm. (SUVmax = 5.9 mild increased FDG uptake along the midline of the abdomen associated with postoperative changes. No focal, suspicious uptake in the liver or within abdominal lymph nodes. Diffuse bladder wall thickening associated with moderate FDG uptake. Some of the uptake within the bladder is likely excreted FDG in the urine in.) Incidental CT findings: Small cysts in the RIGHT kidney. No hydronephrosis. No nephrolithiasis. Post RIGHT hemicolectomy as described.  Mild peri-anastomotic stranding without focal fluid or sign of free air. Thickening along the posterior margin of the anastomotic site. See above. SKELETON: No focal hypermetabolic activity to suggest skeletal metastasis. Focus of increased FDG uptake associated with supraspinatus muscular or tendinous activity about the LEFT shoulder no focal abnormality on CT images. Incidental CT findings: none IMPRESSION: 1. Post RIGHT hemicolectomy with mild FDG uptake associated with the anastomotic site. This may represent mild edema or inflammation, attention on follow-up. 2. Small pulmonary nodules that do not show elevated FDG activity, remain nonspecific. Suggest dedicated chest CT for further assessment. Metastatic disease is considered but there are no priors for comparison. Comparison with prior imaging could also be beneficial if available. 3. Increased FDG uptake associated with supraspinatus about the LEFT shoulder likely related to tendinopathy given lack of CT correlate. Correlate with symptoms. This could potentially relate as well to the recent COVID vaccination in the LEFT arm, attention on follow-up. 4. Persistent but improved bladder wall thickening. No debris in the urinary bladder lumen with mild diffuse uptake remains suggestive of cystitis. Electronically Signed   By: Zetta Bills M.D.   On: 05/12/2020 12:34   DG Chest Port 1 View  Result Date: 05/18/2020 CLINICAL DATA:  Left  Port-A-Cath placement. EXAM: PORTABLE CHEST 1 VIEW COMPARISON:  PET 05/12/2020. FINDINGS: Patient is slightly rotated. Trachea is midline. Heart is enlarged. Thoracic aorta is calcified. Left-sided power port tip projects in the region of the SVC. Difficult to exclude posterior course, within the azygos vein. Lungs are somewhat low in volume with linear atelectasis or scarring at the lung bases. No pleural fluid. No pneumothorax. IMPRESSION: 1. Left-sided power port placement with tip in the region of the SVC. Difficult to  exclude posterior course, within the azygos vein. Lateral view would be helpful. No pneumothorax. 2. Minimal bibasilar subsegmental atelectasis/scarring. 3.  Aortic atherosclerosis (ICD10-I70.0). Electronically Signed   By: Lorin Picket M.D.   On: 05/18/2020 08:48   DG ABD ACUTE 2+V W 1V CHEST  Result Date: 06/02/2020 CLINICAL DATA:  Abdominal distention.  Diarrhea. EXAM: DG ABDOMEN ACUTE W/ 1V CHEST COMPARISON:  Radiographs dated 05/28/2020 FINDINGS: The heart size and pulmonary vascularity are normal. Power port tip in the superior vena cava, unchanged. Minimal linear atelectasis at the left lung base. The lungs are otherwise clear. There are multiple new slightly distended small bowel loops in the mid abdomen. There is air scattered throughout the nondistended colon. The stomach is not distended. No visible free air or free fluid.  No acute bone abnormality. IMPRESSION: New slightly distended small bowel loops in the mid abdomen. This could represent ileus or partial small bowel obstruction. Electronically Signed   By: Lorriane Shire M.D.   On: 06/02/2020 11:06   DG Abdomen Acute W/Chest  Result Date: 05/28/2020 CLINICAL DATA:  Vomiting and diarrhea. Colon cancer, active chemotherapy. EXAM: DG ABDOMEN ACUTE W/ 1V CHEST COMPARISON:  Chest radiograph 05/18/2020 FINDINGS: Left chest port remains in place with tip in the region of the upper SVC. Unchanged heart size and mediastinal contours. No acute airspace disease, pleural effusion or pneumothorax. No evidence of bowel obstruction. Scattered air throughout nondilated small bowel centrally and in the right abdomen. Air within the transverse colon which is slightly featureless. No evidence of free air. No radiopaque calculi or abnormal soft tissue calcifications. Enteric sutures noted in the right abdomen. No acute osseous abnormalities are seen. IMPRESSION: 1. Air within the transverse colon which is featureless and can be seen in the setting of  diarrheal process. No evidence of bowel obstruction. 2. No acute chest findings. Electronically Signed   By: Keith Rake M.D.   On: 05/28/2020 01:41   DG C-Arm 1-60 Min-No Report  Result Date: 05/18/2020 Fluoroscopy was utilized by the requesting physician.  No radiographic interpretation.     Discharge Exam: Vitals:   06/04/20 1055 06/05/20 0422  BP: (!) 127/57 (!) 136/54  Pulse:  79  Resp:  16  Temp:  (!) 97.5 F (36.4 C)  SpO2:  99%   Vitals:   06/04/20 0511 06/04/20 1055 06/05/20 0422 06/05/20 0429  BP: (!) 128/48 (!) 127/57 (!) 136/54   Pulse: 77  79   Resp: 20  16   Temp: 97.7 F (36.5 C)  (!) 97.5 F (36.4 C)   TempSrc: Oral  Oral   SpO2: 97%  99%   Weight:    61.2 kg  Height:        General: Pt is alert, awake, not in acute distress Cardiovascular: RRR, S1/S2 +, no rubs, no gallops Respiratory: CTA bilaterally, no wheezing, no rhonchi Abdominal: Soft, NT, ND, bowel sounds + Extremities: no edema, no cyanosis    The results of significant diagnostics from this hospitalization (including imaging,  microbiology, ancillary and laboratory) are listed below for reference.     Microbiology: Recent Results (from the past 240 hour(s))  SARS Coronavirus 2 by RT PCR (hospital order, performed in [facility] hospital lab) Nasopharyngeal Nasopharyngeal Swab     Status: None   Collection Time: 05/28/20  1:57 AM   Specimen: Nasopharyngeal Swab  Result Value Ref Range Status   SARS Coronavirus 2 NEGATIVE NEGATIVE Final    Comment: (NOTE) SARS-CoV-2 target nucleic acids are NOT DETECTED.  The SARS-CoV-2 RNA is generally detectable in upper and lower respiratory specimens during the acute phase of infection. The lowest concentration of SARS-CoV-2 viral copies this assay can detect is 250 copies / mL. A negative result does not preclude SARS-CoV-2 infection and should not be used as the sole basis for treatment or other patient management decisions.  A negative  result may occur with improper specimen collection / handling, submission of specimen other than nasopharyngeal swab, presence of viral mutation(s) within the areas targeted by this assay, and inadequate number of viral copies (<250 copies / mL). A negative result must be combined with clinical observations, patient history, and epidemiological information.  Fact Sheet for Patients:   StrictlyIdeas.no  Fact Sheet for Healthcare Providers: BankingDealers.co.za  This test is not yet approved or  cleared by the [place] FDA and has been authorized for detection and/or diagnosis of SARS-CoV-2 by FDA under an Emergency Use Authorization (EUA).  This EUA will remain in effect (meaning this test can be used) for the duration of the COVID-19 declaration under Section 564(b)(1) of the Act, 21 U.S.C. section 360bbb-3(b)(1), unless the authorization is terminated or revoked sooner.  Performed at [facility], [address], [place], [place] [address]   MRSA PCR Screening     Status: None   Collection Time: 05/28/20  2:32 PM   Specimen: Nasal Mucosa; Nasopharyngeal  Result Value Ref Range Status   MRSA by PCR NEGATIVE NEGATIVE Final    Comment:        The GeneXpert MRSA Assay (FDA approved for NASAL specimens only), is one component of a comprehensive MRSA colonization surveillance program. It is not intended to diagnose MRSA infection nor to guide or monitor treatment for MRSA infections. Performed at [facility], [address] [place], [place] [address]   Gastrointestinal Panel by PCR , Stool     Status: None   Collection Time: 06/02/20 11:26 AM   Specimen: Stool  Result Value Ref Range Status   Campylobacter species NOT DETECTED NOT DETECTED Final   Plesimonas shigelloides NOT DETECTED NOT DETECTED Final   Salmonella species NOT DETECTED NOT DETECTED Final   Yersinia enterocolitica NOT DETECTED NOT DETECTED Final   Vibrio  species NOT DETECTED NOT DETECTED Final   Vibrio cholerae NOT DETECTED NOT DETECTED Final   Enteroaggregative E coli (EAEC) NOT DETECTED NOT DETECTED Final   Enteropathogenic E coli (EPEC) NOT DETECTED NOT DETECTED Final   Enterotoxigenic E coli (ETEC) NOT DETECTED NOT DETECTED Final   Shiga like toxin producing E coli (STEC) NOT DETECTED NOT DETECTED Final   Shigella/Enteroinvasive E coli (EIEC) NOT DETECTED NOT DETECTED Final   Cryptosporidium NOT DETECTED NOT DETECTED Final   Cyclospora cayetanensis NOT DETECTED NOT DETECTED Final   Entamoeba histolytica NOT DETECTED NOT DETECTED Final   Giardia lamblia NOT DETECTED NOT DETECTED Final   Adenovirus F40/41 NOT DETECTED NOT DETECTED Final   Astrovirus NOT DETECTED NOT DETECTED Final   Norovirus GI/GII NOT DETECTED NOT DETECTED Final  Rotavirus A NOT DETECTED NOT DETECTED Final   Sapovirus (I, II, IV, and V) NOT DETECTED NOT DETECTED Final    Comment: Performed at [facility], [place], [place] [address]  C Difficile Quick Screen w PCR reflex     Status: Abnormal   Collection Time: 06/02/20 11:26 AM   Specimen: Stool  Result Value Ref Range Status   C Diff antigen POSITIVE (A) NEGATIVE Final   C Diff toxin POSITIVE (A) NEGATIVE Final   C Diff interpretation Toxin producing C. difficile detected.  Final    Comment: CRITICAL RESULT CALLED TO, READ BACK BY AND VERIFIED WITH: OSBORNE T. @ 1217 ON [id number] BY HENDERSON L. Performed at [facility], [address] [place], [place] [address]   SARS Coronavirus 2 by RT PCR (hospital order, performed in [facility] hospital lab) Nasopharyngeal Nasopharyngeal Swab     Status: None   Collection Time: 06/02/20  1:18 PM   Specimen: Nasopharyngeal Swab  Result Value Ref Range Status   SARS Coronavirus 2 NEGATIVE NEGATIVE Final    Comment: (NOTE) SARS-CoV-2 target nucleic acids are NOT DETECTED.  The SARS-CoV-2 RNA is generally detectable in upper and lower respiratory  specimens during the acute phase of infection. The lowest concentration of SARS-CoV-2 viral copies this assay can detect is 250 copies / mL. A negative result does not preclude SARS-CoV-2 infection and should not be used as the sole basis for treatment or other patient management decisions.  A negative result may occur with improper specimen collection / handling, submission of specimen other than nasopharyngeal swab, presence of viral mutation(s) within the areas targeted by this assay, and inadequate number of viral copies (<250 copies / mL). A negative result must be combined with clinical observations, patient history, and epidemiological information.  Fact Sheet for Patients:   StrictlyIdeas.no  Fact Sheet for Healthcare Providers: BankingDealers.co.za  This test is not yet approved or  cleared by the [place] FDA and has been authorized for detection and/or diagnosis of SARS-CoV-2 by FDA under an Emergency Use Authorization (EUA).  This EUA will remain in effect (meaning this test can be used) for the duration of the COVID-19 declaration under Section 564(b)(1) of the Act, 21 U.S.C. section 360bbb-3(b)(1), unless the authorization is terminated or revoked sooner.  Performed at [facility], [address] [place],  [address]      Labs: BNP (last 3 results) No results for input(s): BNP in the last 8760 hours. Basic Metabolic Panel: Recent Labs  Lab 05/30/20 0347 06/02/20 0933 06/03/20 0553 06/04/20 0432 06/05/20 0447  NA 137 138 137 138 137  K 3.3* 2.3* 2.2* 3.3* 3.4*  CL 109 106 108 114* 116*  CO2 18* 21* 19* 18* 16*  GLUCOSE 115* 116* 94 92 110*  BUN 20 24* 23 19 15   CREATININE 1.20* 1.85* 1.55* 1.29* 1.05*  CALCIUM 8.3* 8.4* 7.7* 7.8* 7.5*  MG 1.9 1.8 1.7 1.8 1.6*   Liver Function Tests: Recent Labs  Lab 06/02/20 0933 06/03/20 0553 06/04/20 0432 06/05/20 0447  AST 12* 9* 7* 8*  ALT 9 7 6 5    ALKPHOS 85 76 68 59  BILITOT 0.4 0.3 0.3 0.3  PROT 5.7* 5.0* 4.7* 4.5*  ALBUMIN 2.5* 2.1* 2.0* 2.0*   No results for input(s): LIPASE, AMYLASE in the last 168 hours. No results for input(s): AMMONIA in the last 168 hours. CBC: Recent Labs  Lab 05/30/20 0347 06/02/20 5631 06/03/20 4970 06/04/20 0432 06/05/20 0447  WBC 2.5* 10.4 10.5 8.9 7.0  NEUTROABS  --  8.5* 8.1* 6.5 4.6  HGB 9.1* 9.2* 8.3* 8.0* 8.5*  HCT 30.1* 30.2* 26.5* 25.8* 28.4*  MCV 96.2 95.6 94.0 94.5 96.6  PLT 128* 206 238 249 232   Cardiac Enzymes: No results for input(s): CKTOTAL, CKMB, CKMBINDEX, TROPONINI in the last 168 hours. BNP: Invalid input(s): POCBNP CBG: No results for input(s): GLUCAP in the last 168 hours. D-Dimer No results for input(s): DDIMER in the last 72 hours. Hgb A1c No results for input(s): HGBA1C in the last 72 hours. Lipid Profile No results for input(s): CHOL, HDL, LDLCALC, TRIG, CHOLHDL, LDLDIRECT in the last 72 hours. Thyroid function studies No results for input(s): TSH, T4TOTAL, T3FREE, THYROIDAB in the last 72 hours.  Invalid input(s): FREET3 Anemia work up No results for input(s): VITAMINB12, FOLATE, FERRITIN, TIBC, IRON, RETICCTPCT in the last 72 hours. Urinalysis    Component Value Date/Time   COLORURINE YELLOW 06/02/2020 1125   APPEARANCEUR CLOUDY (A) 06/02/2020 1125   LABSPEC 1.012 06/02/2020 1125   PHURINE 5.0 06/02/2020 1125   GLUCOSEU NEGATIVE 06/02/2020 1125   HGBUR SMALL (A) 06/02/2020 1125   BILIRUBINUR NEGATIVE 06/02/2020 1125   KETONESUR NEGATIVE 06/02/2020 1125   PROTEINUR 30 (A) 06/02/2020 1125   NITRITE NEGATIVE 06/02/2020 1125   LEUKOCYTESUR MODERATE (A) 06/02/2020 1125   Sepsis Labs Invalid input(s): PROCALCITONIN,  WBC,  LACTICIDVEN Microbiology Recent Results (from the past 240 hour(s))  SARS Coronavirus 2 by RT PCR (hospital order, performed in [facility] hospital lab) Nasopharyngeal Nasopharyngeal Swab     Status: None   Collection Time:  05/28/20  1:57 AM   Specimen: Nasopharyngeal Swab  Result Value Ref Range Status   SARS Coronavirus 2 NEGATIVE NEGATIVE Final    Comment: (NOTE) SARS-CoV-2 target nucleic acids are NOT DETECTED.  The SARS-CoV-2 RNA is generally detectable in upper and lower respiratory specimens during the acute phase of infection. The lowest concentration of SARS-CoV-2 viral copies this assay can detect is 250 copies / mL. A negative result does not preclude SARS-CoV-2 infection and should not be used as the sole basis for treatment or other patient management decisions.  A negative result may occur with improper specimen collection / handling, submission of specimen other than nasopharyngeal swab, presence of viral mutation(s) within the areas targeted by this assay, and inadequate number of viral copies (<250 copies / mL). A negative result must be combined with clinical observations, patient history, and epidemiological information.  Fact Sheet for Patients:   StrictlyIdeas.no  Fact Sheet for Healthcare Providers: BankingDealers.co.za  This test is not yet approved or  cleared by the [place] FDA and has been authorized for detection and/or diagnosis of SARS-CoV-2 by FDA under an Emergency Use Authorization (EUA).  This EUA will remain in effect (meaning this test can be used) for the duration of the COVID-19 declaration under Section 564(b)(1) of the Act, 21 U.S.C. section 360bbb-3(b)(1), unless the authorization is terminated or revoked sooner.  Performed at [facility], [address], [place], [place] [address]   MRSA PCR Screening     Status: None   Collection Time: 05/28/20  2:32 PM   Specimen: Nasal Mucosa; Nasopharyngeal  Result Value Ref Range Status   MRSA by PCR NEGATIVE NEGATIVE Final    Comment:        The GeneXpert MRSA Assay (FDA approved for NASAL specimens only), is one component of a comprehensive MRSA  colonization surveillance program. It is not intended to  diagnose MRSA infection nor to guide or monitor treatment for MRSA infections. Performed at [facility], [address], [place], [place] [address]   Gastrointestinal Panel by PCR , Stool     Status: None   Collection Time: 06/02/20 11:26 AM   Specimen: Stool  Result Value Ref Range Status   Campylobacter species NOT DETECTED NOT DETECTED Final   Plesimonas shigelloides NOT DETECTED NOT DETECTED Final   Salmonella species NOT DETECTED NOT DETECTED Final   Yersinia enterocolitica NOT DETECTED NOT DETECTED Final   Vibrio species NOT DETECTED NOT DETECTED Final   Vibrio cholerae NOT DETECTED NOT DETECTED Final   Enteroaggregative E coli (EAEC) NOT DETECTED NOT DETECTED Final   Enteropathogenic E coli (EPEC) NOT DETECTED NOT DETECTED Final   Enterotoxigenic E coli (ETEC) NOT DETECTED NOT DETECTED Final   Shiga like toxin producing E coli (STEC) NOT DETECTED NOT DETECTED Final   Shigella/Enteroinvasive E coli (EIEC) NOT DETECTED NOT DETECTED Final   Cryptosporidium NOT DETECTED NOT DETECTED Final   Cyclospora cayetanensis NOT DETECTED NOT DETECTED Final   Entamoeba histolytica NOT DETECTED NOT DETECTED Final   Giardia lamblia NOT DETECTED NOT DETECTED Final   Adenovirus F40/41 NOT DETECTED NOT DETECTED Final   Astrovirus NOT DETECTED NOT DETECTED Final   Norovirus GI/GII NOT DETECTED NOT DETECTED Final   Rotavirus A NOT DETECTED NOT DETECTED Final   Sapovirus (I, II, IV, and V) NOT DETECTED NOT DETECTED Final    Comment: Performed at [facility], [address] [place], [place] [address]  C Difficile Quick Screen w PCR reflex     Status: Abnormal   Collection Time: 06/02/20 11:26 AM   Specimen: Stool  Result Value Ref Range Status   C Diff antigen POSITIVE (A) NEGATIVE Final   C Diff toxin POSITIVE (A) NEGATIVE Final   C Diff interpretation Toxin producing C. difficile detected.  Final    Comment: CRITICAL RESULT  CALLED TO, READ BACK BY AND VERIFIED WITH: OSBORNE T. @ 1217 ON [id number] BY HENDERSON L. Performed at [facility], [address] [place], [place] [address]   SARS Coronavirus 2 by RT PCR (hospital order, performed in [facility] hospital lab) Nasopharyngeal Nasopharyngeal Swab     Status: None   Collection Time: 06/02/20  1:18 PM   Specimen: Nasopharyngeal Swab  Result Value Ref Range Status   SARS Coronavirus 2 NEGATIVE NEGATIVE Final    Comment: (NOTE) SARS-CoV-2 target nucleic acids are NOT DETECTED.  The SARS-CoV-2 RNA is generally detectable in upper and lower respiratory specimens during the acute phase of infection. The lowest concentration of SARS-CoV-2 viral copies this assay can detect is 250 copies / mL. A negative result does not preclude SARS-CoV-2 infection and should not be used as the sole basis for treatment or other patient management decisions.  A negative result may occur with improper specimen collection / handling, submission of specimen other than nasopharyngeal swab, presence of viral mutation(s) within the areas targeted by this assay, and inadequate number of viral copies (<250 copies / mL). A negative result must be combined with clinical observations, patient history, and epidemiological information.  Fact Sheet for Patients:   StrictlyIdeas.no  Fact Sheet for Healthcare Providers: BankingDealers.co.za  This test is not yet approved or  cleared by the [place] FDA and has been authorized for detection and/or diagnosis of SARS-CoV-2 by FDA under an Emergency Use Authorization (EUA).  This EUA will remain in effect (meaning this test can be used) for  the duration of the COVID-19 declaration under Section 564(b)(1) of the Act, 21 U.S.C. section 360bbb-3(b)(1), unless the authorization is terminated or revoked sooner.  Performed at [facility], [address], [place], [place] [address]      Time  coordinating discharge: 35 minutes  SIGNED:   Rodena Goldmann, DO [facility] 06/05/2020, 12:37 PM  If 7PM-7AM, please contact night-coverage [email or web address]

## 2020-06-05 NOTE — Telephone Encounter (Signed)
Patient has been having some PT with [facility].  She was recently hospitalized and is being discharged home today.  Alvis Lemmings is calling to ask for an order for resumption of care with her on Monday.  Please advise.got

## 2020-06-05 NOTE — Progress Notes (Signed)
Nsg Discharge Note  Admit Date:  06/02/2020 Discharge date: 06/05/2020   Brantley Stage to be D/C'd Home per MD order.  AVS completed.  Copy for chart, and copy for patient signed, and dated. Patient/caregiver able to verbalize understanding.  Discharge Medication: Allergies as of 06/05/2020      Reactions   Lisinopril Cough      Medication List    STOP taking these medications   diphenoxylate-atropine 2.5-0.025 MG tablet Commonly known as: Lomotil     TAKE these medications   acetaminophen 500 MG tablet Commonly known as: TYLENOL Take 1,000 mg by mouth every 6 (six) hours as needed for moderate pain.   alendronate 70 MG tablet Commonly known as: FOSAMAX Take 1 tablet (70 mg total) by mouth every 7 (seven) days. Take with a full glass of water on an empty stomach.   amLODipine 5 MG tablet Commonly known as: NORVASC Take 1 tablet (5 mg total) by mouth daily.   atorvastatin 20 MG tablet Commonly known as: LIPITOR Take 1 tablet (20 mg total) by mouth daily.   cholecalciferol 25 MCG (1000 UNIT) tablet Commonly known as: VITAMIN D3 Take 1,000 Units by mouth daily.   diclofenac Sodium 1 % Gel Commonly known as: VOLTAREN Apply 2 g topically 4 (four) times daily.   dorzolamide-timolol 22.3-6.8 MG/ML ophthalmic solution Commonly known as: COSOPT Place 1 drop into both eyes daily.   ferrous sulfate 325 (65 FE) MG EC tablet Take 1 tablet (325 mg total) by mouth 2 (two) times daily with a meal.   fluorouracil CALGB 03474 in sodium chloride 0.9 % 150 mL Inject into the vein over 48 hr.   FLUOROURACIL IV Inject into the vein every 14 (fourteen) days.   LEUCOVORIN CALCIUM IV Inject into the vein every 14 (fourteen) days.   lidocaine-prilocaine cream Commonly known as: EMLA Apply a small amount to port a cath site and cover with plastic wrap 1 hour prior to chemotherapy appointments   OXALIPLATIN IV Inject into the vein every 14 (fourteen) days.   potassium chloride  SA 20 MEQ tablet Commonly known as: KLOR-CON Take 2 tablets (40 mEq total) by mouth 2 (two) times daily for 7 days. What changed: You were already taking a medication with the same name, and this prescription was added. Make sure you understand how and when to take each.   potassium chloride SA 20 MEQ tablet Commonly known as: KLOR-CON Take 1 tablet (20 mEq total) by mouth daily. Start taking on: June 13, 2020 What changed: These instructions start on June 13, 2020. If you are unsure what to do until then, ask your doctor or other care provider.   prochlorperazine 10 MG tablet Commonly known as: COMPAZINE Take 1 tablet (10 mg total) by mouth every 6 (six) hours as needed for nausea or vomiting.   saccharomyces boulardii 250 MG capsule Commonly known as: FLORASTOR Take 1 capsule (250 mg total) by mouth 2 (two) times daily.   vancomycin 125 MG capsule Commonly known as: VANCOCIN Take 1 capsule (125 mg total) by mouth 4 (four) times daily for 12 days.       Discharge Assessment: Vitals:   06/04/20 1055 06/05/20 0422  BP: (!) 127/57 (!) 136/54  Pulse:  79  Resp:  16  Temp:  (!) 97.5 F (36.4 C)  SpO2:  99%   Skin clean, dry and intact without evidence of skin break down, no evidence of skin tears noted. IV catheter discontinued intact. Site without signs  and symptoms of complications - no redness or edema noted at insertion site, patient denies c/o pain - only slight tenderness at site.  Dressing with slight pressure applied.  D/c Instructions-Education: Discharge instructions given to patient/family with verbalized understanding. D/c education completed with patient/family including follow up instructions, medication list, d/c activities limitations if indicated, with other d/c instructions as indicated by MD - patient able to verbalize understanding, all questions fully answered. Patient instructed to return to ED, call 911, or call MD for any changes in condition.  Patient  escorted via [place], and D/C home via private auto.  Dorcas Mcmurray, RN 06/05/2020 1:51 PM

## 2020-06-05 NOTE — Telephone Encounter (Signed)
Verbal order given  

## 2020-06-05 NOTE — Telephone Encounter (Signed)
    Transitional Care Management  Contact Attempt Attempt Date:06/05/2020 Attempted By:   1st unsuccessful TCM contact attempt.   I reached out to Gabrielle [name] on her preferred telephone number to discuss Transitional Care Management, medication reconciliation, and to schedule a TCM hospital follow-up with her PCP at [facility].  Discharge Date: 06/05/2020 Location: [facility] Discharge Dx:  Enterocolitis due to Clostridioides difficile  Recommendations for Outpatient Follow-up:  1. Follow up with PCP in 1-2 weeks 2. Please obtain BMP/CBC in one week 3. Continue on potassium as prescribed at 40 mEq twice daily for the next 7 days and then resume 20 mEq daily thereafter 4. Continue on vancomycin as prescribed with 125 mg 4 times daily for 12 more days to complete total 14-day course of treatment 5. Continue on Florastor as prescribed 6. Follow-up with gastroenterology Dr. Laural Golden in 2 weeks for further evaluation and management. 7. Continue chemotherapy appointments as prior   Plan I left a HIPPA compliant message for her to return my call.  Will attempt to contact again within the 2 business day post discharge window if she does not return my call.

## 2020-06-05 NOTE — TOC Transition Note (Signed)
Transition of Care [facility]) - CM/SW Discharge Note   Patient Details  Name: [name] MRN: [id number] Date of Birth: 12-04-1946  Transition of Care [facility]) CM/SW Contact:  Boneta Lucks, RN Phone Number: 06/05/2020, 2:18 PM   Clinical Narrative:   Patient discharging home today. Patient is active with [facility]. Order at in and [place] with [facility] updated with discharge plan.    Final next level of care: [place] Barriers to Discharge: Barriers Resolved   Patient Goals and CMS Choice Patient states their goals for this hospitalization and ongoing recovery are:: to go home.   Choice offered to / list presented to : Adult Children  Discharge Placement              Patient and family notified of of transfer: 06/05/20  Discharge Plan and Services             HH Arranged: PT [facility] Agency: [place] Date [facility] Agency Contacted: 06/05/20 Time HH Agency Contacted: 1300 Representative spoke with at [place]: Jenny Reichmann  Social Determinants of Health ([place]) Interventions

## 2020-06-05 NOTE — Progress Notes (Signed)
Pharmacy Renal Dosing Adjustment  Drug: Lovenox Original dose: 30mg  daily Scr:  1.05mg /dL CrClest~ 7mlmin New dose:  Lovenox 40mg  daily  Thank You, Despina Pole, Pharm. D. Clinical Pharmacist 06/05/2020 8:47 AM

## 2020-06-05 NOTE — Telephone Encounter (Signed)
Received message from hospitalist. Patient being d/c.d Requesting outpatient follow-up with NUR office. Please arrange, thanks!

## 2020-06-09 NOTE — Telephone Encounter (Signed)
Forwarded to Mitzie 

## 2020-06-09 NOTE — Telephone Encounter (Signed)
TRANSITIONAL CARE MANAGEMENT TELEPHONE OUTREACH NOTE   Contact Date: 06/09/2020 Contacted By: Truett Mainland, LPN   DISCHARGE INFORMATION Date of Discharge:06/05/2020 Discharge Facility: [facility] Discharge Diagnosis: Enterocolitis due to Clostridioides difficile  Outpatient Follow Up Recommendations (copied from discharge summary) 1. Follow up with PCP in 1-2 weeks 2. Please obtain BMP/CBC in one week 3. Continue on potassium as prescribed at 40 mEq twice daily for the next 7 days and then resume 20 mEq daily thereafter 4. Continue on vancomycin as prescribed with 125 mg 4 times daily for 12 more days to complete total 14-day course of treatment 5. Continue on Florastor as prescribed 6. Follow-up with gastroenterology Dr.Rehmanin 2 weeks for further evaluation and management. 7. Continue chemotherapy appointments as prior  Gabrielle [name] is a female primary care patient of Gabrielle Norlander, DO. An outgoing telephone call was made today and I spoke with Gabrielle [name].  Gabrielle [name] condition(s) and treatment(s) were discussed. An opportunity to ask questions was provided and all were answered or forwarded as appropriate.    ACTIVITIES OF DAILY LIVING  Gabrielle [name] lives alone and she can perform ADLs independently. her primary caregiver is herself. she is able to depend on her primary caregiver(s) for consistent help. Transportation to appointments, to pick up medications, and to run errands is not a problem.  (Consider referral to [place] if transportation or a consistent caregiver is a problem)   Fall Risk Fall Risk  04/21/2020 08/02/2018  Falls in the past year? 0 No  Number falls in past yr: 0 -  Injury with Fall? 0 -    medium [place] Modifications/Assistive Devices Wheelchair: No Cane: No Ramp: No Bedside Toilet: No Hospital Bed:  No Other: Walker as needed    [place] she is receiving home health Physical therapy  services.    MEDICATION RECONCILIATION  Gabrielle [name] has been able to pick-up all prescribed discharge medications from the pharmacy.   A post discharge medication reconciliation was performed and the complete medication list was reviewed with the patient/caregiver and is current as of 06/09/2020. Changes highlighted below.  Discontinued Medications diphenoxylate-atropine 2.5-0.025 MG tablet Commonly known as: Lomotil  Current Medication List   TAKE these medications   acetaminophen 500 MG tablet Commonly known as: TYLENOL Take 1,000 mg by mouth every 6 (six) hours as needed for moderate pain.   alendronate 70 MG tablet Commonly known as: FOSAMAX Take 1 tablet (70 mg total) by mouth every 7 (seven) days. Take with a full glass of water on an empty stomach.   amLODipine 5 MG tablet Commonly known as: NORVASC Take 1 tablet (5 mg total) by mouth daily.   atorvastatin 20 MG tablet Commonly known as: LIPITOR Take 1 tablet (20 mg total) by mouth daily.   cholecalciferol 25 MCG (1000 UNIT) tablet Commonly known as: VITAMIN D3 Take 1,000 Units by mouth daily.   diclofenac Sodium 1 % Gel Commonly known as: VOLTAREN Apply 2 g topically 4 (four) times daily.   dorzolamide-timolol 22.3-6.8 MG/ML ophthalmic solution Commonly known as: COSOPT Place 1 drop into both eyes daily.   ferrous sulfate 325 (65 FE) MG EC tablet Take 1 tablet (325 mg total) by mouth 2 (two) times daily with a meal.   fluorouracil CALGB 32355 in sodium chloride 0.9 % 150 mL Inject into the vein over 48 hr.   FLUOROURACIL IV Inject into the vein every 14 (fourteen) days.  LEUCOVORIN CALCIUM IV Inject into the vein every 14 (fourteen) days.   lidocaine-prilocaine cream Commonly known as: EMLA Apply a small amount to port a cath site and cover with plastic wrap 1 hour prior to chemotherapy appointments   OXALIPLATIN IV Inject into the vein every 14 (fourteen) days.   potassium chloride SA  20 MEQ tablet Commonly known as: KLOR-CON Take 2 tablets (40 mEq total) by mouth 2 (two) times daily for 7 days. What changed: You were already taking a medication with the same name, and this prescription was added. Make sure you understand how and when to take each.   potassium chloride SA 20 MEQ tablet Commonly known as: KLOR-CON Take 1 tablet (20 mEq total) by mouth daily. Start taking on: June 13, 2020 What changed: These instructions start on June 13, 2020. If you are unsure what to do until then, ask your doctor or other care provider.   prochlorperazine 10 MG tablet Commonly known as: COMPAZINE Take 1 tablet (10 mg total) by mouth every 6 (six) hours as needed for nausea or vomiting.   saccharomyces boulardii 250 MG capsule Commonly known as: FLORASTOR Take 1 capsule (250 mg total) by mouth 2 (two) times daily.   vancomycin 125 MG capsule Commonly known as: VANCOCIN Take 1 capsule (125 mg total) by mouth 4 (four) times daily for 12 days.       PATIENT EDUCATION & FOLLOW-UP PLAN  An appointment for Transitional Care Management is scheduled with Onyeje M. Ijaola  on 06/15/2020 at 1100am  Take all medications as prescribed  Contact our office by calling [phone] if you have any questions or concerns

## 2020-06-15 ENCOUNTER — Other Ambulatory Visit: Payer: Self-pay

## 2020-06-15 ENCOUNTER — Encounter: Payer: Self-pay | Admitting: Nurse Practitioner

## 2020-06-15 ENCOUNTER — Ambulatory Visit (INDEPENDENT_AMBULATORY_CARE_PROVIDER_SITE_OTHER): Payer: Medicare HMO | Admitting: Nurse Practitioner

## 2020-06-15 VITALS — BP 125/63 | HR 69 | Temp 97.9°F | Ht 62.0 in | Wt 132.6 lb

## 2020-06-15 DIAGNOSIS — A0472 Enterocolitis due to Clostridium difficile, not specified as recurrent: Secondary | ICD-10-CM | POA: Diagnosis not present

## 2020-06-15 DIAGNOSIS — Z09 Encounter for follow-up examination after completed treatment for conditions other than malignant neoplasm: Secondary | ICD-10-CM | POA: Insufficient documentation

## 2020-06-15 NOTE — Assessment & Plan Note (Signed)
Symptoms are well managed.  Patient is not complaining of nausea, vomiting, loose stools or abdominal cramping.  Patient continues to take Flagyl and vancomycin as ordered.  With no complications.  All discharge instructions completed. patient verbalized understanding. Education provided to prevent future infection.

## 2020-06-15 NOTE — Progress Notes (Signed)
Established Patient Office Visit  Subjective:  Patient ID: Gabrielle [name], female    DOB: Jun 04, 1946  Age: 74 y.o. MRN: [id number]  CC:  Chief Complaint  Patient presents with  . Transitions Of Care    [facility]- Diarrhea   . C-diff    HPI Gabrielle [name] presents for transition of care management and hospital follow-up.  Patient was hospitalized June 02, 2020 for diarrhea/C. difficile enterocolitis with partial small bowel obstruction.  Patient is reporting no diarrhea in the last 2 days.  No pain, cramping, or nausea.  Symptoms are resolving.  All hospital discharge instructions reviewed with patient.  BMP/CBC labs obtained today.  Blood pressure reevaluate to restart antihypertensive as indicated on hospital discharge summary.  Reminded patient of follow-up appointment with gastroenterology in 2 weeks for further evaluation and management.  Concerning patient's enterocolitis due to C. Difficile Patient is reporting resolved symptoms.  No nausea vomiting, loose stools, pain or abdominal cramping.  Patient reports continuing with home medication and discharge medication.  Patient is tolerating Flagyl and vancomycin with no complications.  Patient is not reporting any weakness or fatigue.    Past Medical History:  Diagnosis Date  . Arthritis   . Cancer ([place])   . Cataract    OU  . Colon cancer ([place])   . H/O cesarean section   . Hx of tonsillectomy   . Hyperlipidemia   . Hypertension   . Macular degeneration    Exu ARMD OU    Past Surgical History:  Procedure Laterality Date  . BIOPSY  04/06/2020   Procedure: BIOPSY;  Surgeon: Rogene Houston, MD;  Location: [facility];  Service: Endoscopy;;  . CARPAL TUNNEL RELEASE Right   . CATARACT EXTRACTION W/PHACO Left 10/11/2019   Procedure: CATARACT EXTRACTION PHACO AND INTRAOCULAR LENS PLACEMENT (IOC);  Surgeon: Baruch Goldmann, MD;  Location: [facility] ORS;  Service: Ophthalmology;  Laterality: Left;  CDE: 8.56  . CATARACT EXTRACTION  W/PHACO Right 10/25/2019   Procedure: CATARACT EXTRACTION PHACO AND INTRAOCULAR LENS PLACEMENT (IOC);  Surgeon: Baruch Goldmann, MD;  Location: [facility];  Service: Ophthalmology;  Laterality: Right;  CDE: 5.67  . CESAREAN SECTION    . COLONOSCOPY N/A 04/06/2020   Procedure: COLONOSCOPY;  Surgeon: Rogene Houston, MD;  [facility];  Service: Endoscopy;  Laterality: N/A;  . CYSTOSCOPY WITH BIOPSY N/A 04/08/2020   Procedure: CYSTOSCOPY WITH BIOPSY;  Surgeon: Cleon Gustin, MD;  Location: [facility];  Service: Urology;  Laterality: N/A;  . ESOPHAGOGASTRODUODENOSCOPY N/A 04/05/2020   Procedure: ESOPHAGOGASTRODUODENOSCOPY (EGD);  Surgeon: Rogene Houston, MD;  [facility];  Service: Endoscopy;  Laterality: N/A;  . PARTIAL COLECTOMY N/A 04/08/2020   Procedure: PARTIAL COLECTOMY;  Surgeon: Virl Cagey, MD;  Location: [facility];  Service: General;  Laterality: N/A;  . PORTACATH PLACEMENT Left 05/18/2020   Procedure: INSERTION PORT-A-CATH (ATTACHED CATHETER IN LEFT SUBCLAVIAN);  Surgeon: Virl Cagey, MD;  Location: [facility];  Service: General;  Laterality: Left;  . TONSILLECTOMY      Family History  Problem Relation Age of Onset  . Macular degeneration Mother   . Stroke Mother   . Hypertension Mother   . Dementia Mother   . Cancer Father        lung  . Diabetes Sister   . Hypertension Sister     Social History   Socioeconomic History  . Marital status: Divorced    Spouse name: Not on file  .  Number of children: Not on file  . Years of education: Not on file  . Highest education level: Not on file  Occupational History  . Not on file  Tobacco Use  . Smoking status: Never Smoker  . Smokeless tobacco: Never Used  Vaping Use  . Vaping Use: Never used  Substance and Sexual Activity  . Alcohol use: No  . Drug use: No  . Sexual activity: Not Currently  Other Topics Concern  . Not on file  Social History Narrative  . Not on file   Social Determinants of  Health   Financial Resource Strain: Medium Risk  . Difficulty of Paying Living Expenses: Somewhat hard  Food Insecurity: Food Insecurity Present  . Worried About Charity fundraiser in the Last Year: Sometimes true  . Ran Out of Food in the Last Year: Never true  Transportation Needs: Unmet Transportation Needs  . Lack of Transportation (Medical): Yes  . Lack of Transportation (Non-Medical): Yes  Physical Activity: Insufficiently Active  . Days of Exercise per Week: 7 days  . Minutes of Exercise per Session: 20 min  Stress: Stress Concern Present  . Feeling of Stress : Very much  Social Connections: Moderately Integrated  . Frequency of Communication with Friends and Family: More than three times a week  . Frequency of Social Gatherings with Friends and Family: Once a week  . Attends Religious Services: More than 4 times per year  . Active Member of Clubs or Organizations: Yes  . Attends Archivist Meetings: Never  . Marital Status: Divorced  Human resources officer Violence: Not At Risk  . Fear of Current or Ex-Partner: No  . Emotionally Abused: No  . Physically Abused: No  . Sexually Abused: No    Outpatient Medications Prior to Visit  Medication Sig Dispense Refill  . acetaminophen (TYLENOL) 500 MG tablet Take 1,000 mg by mouth every 6 (six) hours as needed for moderate pain.     Marland Kitchen alendronate (FOSAMAX) 70 MG tablet Take 1 tablet (70 mg total) by mouth every 7 (seven) days. Take with a full glass of water on an empty stomach. 4 tablet 11  . amLODipine (NORVASC) 5 MG tablet Take 1 tablet (5 mg total) by mouth daily. 30 tablet 1  . atorvastatin (LIPITOR) 20 MG tablet Take 1 tablet (20 mg total) by mouth daily. 90 tablet 3  . cholecalciferol (VITAMIN D3) 25 MCG (1000 UT) tablet Take 1,000 Units by mouth daily.    . diclofenac Sodium (VOLTAREN) 1 % GEL Apply 2 g topically 4 (four) times daily.    . dorzolamide-timolol (COSOPT) 22.3-6.8 MG/ML ophthalmic solution Place 1 drop  into both eyes daily. 10 mL 2  . ferrous sulfate 325 (65 FE) MG EC tablet Take 1 tablet (325 mg total) by mouth 2 (two) times daily with a meal. 180 tablet 3  . fluorouracil CALGB 81448 in sodium chloride 0.9 % 150 mL Inject into the vein over 48 hr.    . FLUOROURACIL IV Inject into the vein every 14 (fourteen) days.    Marland Kitchen LEUCOVORIN CALCIUM IV Inject into the vein every 14 (fourteen) days.    Marland Kitchen lidocaine-prilocaine (EMLA) cream Apply a small amount to port a cath site and cover with plastic wrap 1 hour prior to chemotherapy appointments 30 g 0  . OXALIPLATIN IV Inject into the vein every 14 (fourteen) days.    . potassium chloride SA (KLOR-CON) 20 MEQ tablet Take 1 tablet (20 mEq total) by mouth  daily. 90 tablet 1  . prochlorperazine (COMPAZINE) 10 MG tablet Take 1 tablet (10 mg total) by mouth every 6 (six) hours as needed for nausea or vomiting. 30 tablet 0  . saccharomyces boulardii (FLORASTOR) 250 MG capsule Take 1 capsule (250 mg total) by mouth 2 (two) times daily. 60 capsule 2  . vancomycin (VANCOCIN) 125 MG capsule Take 1 capsule (125 mg total) by mouth 4 (four) times daily for 12 days. 48 capsule 0  . potassium chloride SA (KLOR-CON) 20 MEQ tablet Take 2 tablets (40 mEq total) by mouth 2 (two) times daily for 7 days. 28 tablet 0   No facility-administered medications prior to visit.    Allergies  Allergen Reactions  . Lisinopril Cough    ROS Review of Systems  Constitutional: Negative.   HENT: Negative.   Respiratory: Negative.   Cardiovascular: Negative.   Gastrointestinal: Negative for abdominal distention, abdominal pain, diarrhea, nausea and vomiting.  Genitourinary: Negative.   Skin: Negative for color change and rash.  Neurological: Negative for light-headedness and headaches.  Psychiatric/Behavioral: Negative for confusion. The patient is not nervous/anxious.       Objective:    Physical Exam Constitutional:      Appearance: Normal appearance. She is normal  weight.  HENT:     Head: Normocephalic.  Eyes:     Conjunctiva/sclera: Conjunctivae normal.  Cardiovascular:     Rate and Rhythm: Normal rate and regular rhythm.     Pulses: Normal pulses.     Heart sounds: Normal heart sounds.  Pulmonary:     Effort: Pulmonary effort is normal.     Breath sounds: Normal breath sounds.  Abdominal:     General: There is no distension.     Palpations: Abdomen is soft.     Tenderness: There is no abdominal tenderness.  Musculoskeletal:        General: No tenderness.     Cervical back: No tenderness.  Skin:    General: Skin is warm.     Findings: No erythema.  Neurological:     Mental Status: She is alert and oriented to person, place, and time.  Psychiatric:        Mood and Affect: Mood normal.     BP 125/63   Pulse 69   Temp 97.9 F (36.6 C) (Temporal)   Ht 5' 2" (1.575 m)   Wt 132 lb 9.6 oz (60.1 kg)   SpO2 100%   BMI 24.25 kg/m  Wt Readings from Last 3 Encounters:  06/15/20 132 lb 9.6 oz (60.1 kg)  06/05/20 134 lb 14.7 oz (61.2 kg)  05/30/20 145 lb 11.6 oz (66.1 kg)     There are no preventive care reminders to display for this patient.  There are no preventive care reminders to display for this patient.  Lab Results  Component Value Date   TSH 2.500 03/23/2020   Lab Results  Component Value Date   WBC 7.0 06/05/2020   HGB 8.5 (L) 06/05/2020   HCT 28.4 (L) 06/05/2020   MCV 96.6 06/05/2020   PLT 232 06/05/2020   Lab Results  Component Value Date   NA 137 06/05/2020   K 3.4 (L) 06/05/2020   CO2 16 (L) 06/05/2020   GLUCOSE 110 (H) 06/05/2020   BUN 15 06/05/2020   CREATININE 1.05 (H) 06/05/2020   BILITOT 0.3 06/05/2020   ALKPHOS 59 06/05/2020   AST 8 (L) 06/05/2020   ALT 5 06/05/2020   PROT 4.5 (L) 06/05/2020   ALBUMIN  2.0 (L) 06/05/2020   CALCIUM 7.5 (L) 06/05/2020   ANIONGAP 5 06/05/2020   Lab Results  Component Value Date   CHOL 137 03/23/2020   Lab Results  Component Value Date   HDL 40 03/23/2020    Lab Results  Component Value Date   LDLCALC 73 03/23/2020   Lab Results  Component Value Date   TRIG 139 03/23/2020   Lab Results  Component Value Date   CHOLHDL 3.4 03/23/2020   Lab Results  Component Value Date   HGBA1C 6.0 01/03/2018      Assessment & Plan:   Problem List Items Addressed This Visit      Digestive   Enterocolitis due to Clostridioides difficile    Symptoms are well managed.  Patient is not complaining of nausea, vomiting, loose stools or abdominal cramping.  Patient continues to take Flagyl and vancomycin as ordered.  With no complications.  All discharge instructions completed. patient verbalized understanding. Education provided to prevent future infection.        Other   Hospital discharge follow-up - Primary    Patient is a 74 year old female who presents for transition of care management and hospital follow-up.  Patient was hospitalized June 02, 2020 for diarrhea/C. difficile enterocolitis with partial small bowel obstruction.  Patient is reporting symptom resolution, no diarrhea in 2 days, no pain, cramps or nausea.  All hospital discharge instructions completed.  Reminded patient of follow-up appointment with gastroenterologist in 2 weeks for further evaluation and management. BMP/CBC labs obtained today.  Blood pressures repeated to reevaluate continuing antihypertensive. Patient knows to follow-up with new symptoms      Relevant Orders   CBC with Differential   CMP14+EGFR      No orders of the defined types were placed in this encounter.   Follow-up: Return if symptoms worsen or fail to improve.    Ivy Lynn, NP

## 2020-06-15 NOTE — Assessment & Plan Note (Addendum)
Patient is a 74 year old female who presents for transition of care management and hospital follow-up.  Patient was hospitalized June 02, 2020 for diarrhea/C. difficile enterocolitis with partial small bowel obstruction.  Patient is reporting symptom resolution, no diarrhea in 2 days, no pain, cramps or nausea.  All hospital discharge instructions completed.  Reminded patient of follow-up appointment with gastroenterologist in 2 weeks for further evaluation and management. BMP/CBC labs obtained today.  Blood pressures repeated to reevaluate continuing antihypertensive. Patient knows to follow-up with new symptoms.

## 2020-06-15 NOTE — Patient Instructions (Addendum)
Enterocolitis due to Clostridioides difficile Symptoms are well managed.  Patient is not complaining of nausea, vomiting, loose stools or abdominal cramping.  Patient continues to take Flagyl and vancomycin as ordered.  With no complications.  All discharge instructions completed. patient verbalized understanding. Education provided to prevent future infection.  Hospital discharge follow-up Patient is a 74 year old female who presents for transition of care management and hospital follow-up.  Patient was hospitalized June 02, 2020 for diarrhea/C. difficile enterocolitis with partial small bowel obstruction.  Patient is reporting symptom resolution, no diarrhea in 2 days, no pain, cramps or nausea.  All hospital discharge instructions completed.  Reminded patient of follow-up appointment with gastroenterologist in 2 weeks for further evaluation and management. BMP/CBC labs obtained today.  Blood pressures repeated to reevaluate continuing antihypertensive. Patient knows to follow-up with new symptoms   Diarrhea, Adult Diarrhea is frequent loose and watery bowel movements. Diarrhea can make you feel weak and cause you to become dehydrated. Dehydration can make you tired and thirsty, cause you to have a dry mouth, and decrease how often you urinate. Diarrhea typically lasts 2-3 days. However, it can last longer if it is a sign of something more serious. It is important to treat your diarrhea as told by your health care provider. Follow these instructions at home: Eating and drinking     Follow these recommendations as told by your health care provider:  Take an oral rehydration solution (ORS). This is an over-the-counter medicine that helps return your body to its normal balance of nutrients and water. It is found at pharmacies and retail stores.  Drink plenty of fluids, such as water, ice chips, diluted fruit juice, and low-calorie sports drinks. You can drink milk also, if desired.  Avoid  drinking fluids that contain a lot of sugar or caffeine, such as energy drinks, sports drinks, and soda.  Eat bland, easy-to-digest foods in small amounts as you are able. These foods include bananas, applesauce, rice, lean meats, toast, and crackers.  Avoid alcohol.  Avoid spicy or fatty foods.  Medicines  Take over-the-counter and prescription medicines only as told by your health care provider.  If you were prescribed an antibiotic medicine, take it as told by your health care provider. Do not stop using the antibiotic even if you start to feel better. General instructions   Wash your hands often using soap and water. If soap and water are not available, use a hand sanitizer. Others in the household should wash their hands as well. Hands should be washed: ? After using the toilet or changing a diaper. ? Before preparing, cooking, or serving food. ? While caring for a sick person or while visiting someone in a hospital.  Drink enough fluid to keep your urine pale yellow.  Rest at home while you recover.  Watch your condition for any changes.  Take a warm bath to relieve any burning or pain from frequent diarrhea episodes.  Keep all follow-up visits as told by your health care provider. This is important. Contact a health care provider if:  You have a fever.  Your diarrhea gets worse.  You have new symptoms.  You cannot keep fluids down.  You feel light-headed or dizzy.  You have a headache.  You have muscle cramps. Get help right away if:  You have chest pain.  You feel extremely weak or you faint.  You have bloody or black stools or stools that look like tar.  You have severe pain, cramping, or bloating  in your abdomen.  You have trouble breathing or you are breathing very quickly.  Your heart is beating very quickly.  Your skin feels cold and clammy.  You feel confused.  You have signs of dehydration, such as: ? Dark urine, very little urine, or no  urine. ? Cracked lips. ? Dry mouth. ? Sunken eyes. ? Sleepiness. ? Weakness. Summary  Diarrhea is frequent loose and watery bowel movements. Diarrhea can make you feel weak and cause you to become dehydrated.  Drink enough fluids to keep your urine pale yellow.  Make sure that you wash your hands after using the toilet. If soap and water are not available, use hand sanitizer.  Contact a health care provider if your diarrhea gets worse or you have new symptoms.  Get help right away if you have signs of dehydration. This information is not intended to replace advice given to you by your health care provider. Make sure you discuss any questions you have with your health care provider. Document Revised: 04/09/2019 Document Reviewed: 04/27/2018 Elsevier Patient Education  2020 [place]. Preventing MDRO Infections Multidrug-resistant organisms (MDRO) are bacteria that have become resistant to antibiotic medicines. This means that antibiotics cannot stop the bacteria from growing. Types of MDRO include:  Methicillin/oxacillin-resistant Staphylococcus aureus (MRSA).  Vancomycin-resistant enterococci (VRE).  Extended-spectrum beta-lactamases (ESBLs).  Clostridioides difficile (C. difficile).  Multi-drug resistant tuberculosis (MDR TB).  Penicillin-resistant Streptococcus pneumonia (PRSP).  Carbapenem-resistant enterobacteriaceae (CRE). Everyone has good and bad bacteria in his or her body, such as in the stomach or on the skin. Good bacteria help protect the body from infection. However, when you take an antibiotic medicine, it may kill both the good and bad bacteria, which then allows medicine-resistant bacteria to grow. Infections caused by MDRO can be difficult to treat. It is important to follow certain safety measures to prevent the spread of MDRO. What increases the risk? You are more likely to develop a MDRO infection if:  You were treated with an antibiotic medicine for a  long time.  You have been in the hospital for a long time.  You recently had major surgery, such as chest or abdominal surgery.  You have a weakened disease-fighting (immune) system. This may be caused by an illness, long-term (chronic) condition, or medical treatment.  You have a catheter that has stayed in for a long time, such as a urinary catheter or vascular access device. MDRO are usually spread through hands that have the germs (contaminated hands). MDRO may also be spread through:  Medical equipment that was not cleaned properly.  Shared personal items, such as razors or towels.  Contaminated surfaces, such as a bathroom counter or sink.  Undercooked or raw meat. Animals treated with antibiotics may have medicine-resistant bacteria.  Water or vegetables contaminated with animal feces. How is this treated? MDRO infections are usually treated with antibiotic medicines that are taken by mouth (oral antibiotics). Treatment depends on the type of MDRO you have. MDRO infections are difficult to treat, and you may need to be hospitalized. Depending on how severe your infection is, you may need other treatments such as:  IV antibiotics.  High-dose antibiotics.  More than one antibiotic.  Antibiotics that you breathe in (inhaled antibiotics), if you have pneumonia.  A machine to help you breathe (ventilator). What actions can be taken? What hospitals are doing:  Encouraging staff, patients, and visitors to wash hands often with soap and warm water, and to use hand sanitizer when soap and water  are not available.  Taking extra steps to prevent infection (contact precautions) with patients who are infected with MDRO. Contact precautions include: ? Having all healthcare workers and visitors wash their hands before and after leaving the room. ? Having all healthcare workers and visitors wear a gown and gloves while in the room, and asking them throw away the gown and gloves before  leaving the room.  Prescribing antibiotic medicines only when they are needed. Over-prescribing antibiotic medicines can help the spread of MDRO.  Keeping patients with MDRO in a room by themselves (isolation) or placing them in rooms with other patients who are already infected with MDRO.  Carefully cleaning and disinfecting hospital rooms and equipment.  Improving communication about which patients are infected with MDRO or who have been infected in the past.  Closely monitoring and tracking MDRO infections.  Educating staff and patients about the signs of infection. What you can do:   Wash your hands regularly with soap and warm water. If soap and water are not available, use hand sanitizer.  Take antibiotic medicines only when needed. Do not take antibiotic medicines for viral infections such as the common cold.  If you were prescribed an antibiotic medicine, take it only as told by your health care provider. Do not stop taking the antibiotic even if you start to feel better.  Do not share antibiotic medicines with others.  Do not share personal items, such as bath towels or razors.  If you have a catheter, care for it as told by your health care provider.  Keep all wounds clean and dry. Follow your health care provider's instructions about how to care for any wounds you have.  Practice safe food handling. This includes: ? Washing all fruits and vegetables. ? Washing all utensils that have come in contact with raw meat. ? Keeping a separate cutting board for raw meat. ? Cooking meat thoroughly. All poultry (including chicken and [place]) should be cooked to at least 165F (74C). Ground beef, pork, or lamb should be cooked to at least 160F (71C), and whole beef, pork, or lamb should be cooked to at least 145F (63C). ? Washing your hands with soap and warm water before and after cooking, especially after handling raw meat.  Clean and disinfect surfaces that are touched  often. Use solutions or products that contain bleach. Do this on a regular basis. What visitors can do: Although it is rare, visitors can be infected with MDRO. To prevent this, visitors should:  Wash their hands with soap and warm water before and after visiting you. If soap and water are not available, they can use hand sanitizer.  Ask your health care provider if they need to wear gloves and gowns when they visit you. If they do need to wear these, make sure they throw away the gloves and gowns before they leave your room. Where to find more information You can find more information about preventing MDRO infections from:  Centers for Disease Control and Prevention: eBuzzed.gl Summary  MDRO are bacteria that have become resistant to antibiotic medicines.  You are more likely to be infected with a MDRO if you have been taking an antibiotic medicine for a long time or have been hospitalized for a long time.  If you were prescribed an antibiotic medicine, take it exactly as told by your health care provider.  Wash your hands regularly with soap and warm water. If soap and water are not available, use hand sanitizer.  Ask your  health care provider whether your visitors need to wear gloves and gowns when visiting you. This information is not intended to replace advice given to you by your health care provider. Make sure you discuss any questions you have with your health care provider. Document Revised: 03/15/2019 Document Reviewed: 04/07/2017 Elsevier Patient Education  [place].

## 2020-06-16 ENCOUNTER — Inpatient Hospital Stay (HOSPITAL_COMMUNITY): Payer: Medicare HMO | Attending: Hematology | Admitting: General Practice

## 2020-06-16 ENCOUNTER — Telehealth (HOSPITAL_COMMUNITY): Payer: Self-pay | Admitting: General Practice

## 2020-06-16 ENCOUNTER — Encounter: Payer: Self-pay | Admitting: General Practice

## 2020-06-16 DIAGNOSIS — C189 Malignant neoplasm of colon, unspecified: Secondary | ICD-10-CM

## 2020-06-16 DIAGNOSIS — D631 Anemia in chronic kidney disease: Secondary | ICD-10-CM | POA: Insufficient documentation

## 2020-06-16 DIAGNOSIS — N189 Chronic kidney disease, unspecified: Secondary | ICD-10-CM | POA: Insufficient documentation

## 2020-06-16 DIAGNOSIS — C182 Malignant neoplasm of ascending colon: Secondary | ICD-10-CM | POA: Insufficient documentation

## 2020-06-16 DIAGNOSIS — Z5111 Encounter for antineoplastic chemotherapy: Secondary | ICD-10-CM | POA: Insufficient documentation

## 2020-06-16 DIAGNOSIS — D509 Iron deficiency anemia, unspecified: Secondary | ICD-10-CM | POA: Insufficient documentation

## 2020-06-16 LAB — CMP14+EGFR
ALT: 5 IU/L (ref 0–32)
AST: 18 IU/L (ref 0–40)
Albumin/Globulin Ratio: 1.3 (ref 1.2–2.2)
Albumin: 3.3 g/dL — ABNORMAL LOW (ref 3.7–4.7)
Alkaline Phosphatase: 72 IU/L (ref 48–121)
BUN/Creatinine Ratio: 10 — ABNORMAL LOW (ref 12–28)
BUN: 11 mg/dL (ref 8–27)
Bilirubin Total: 0.2 mg/dL (ref 0.0–1.2)
CO2: 26 mmol/L (ref 20–29)
Calcium: 9.1 mg/dL (ref 8.7–10.3)
Chloride: 105 mmol/L (ref 96–106)
Creatinine, Ser: 1.08 mg/dL — ABNORMAL HIGH (ref 0.57–1.00)
GFR calc Af Amer: 59 mL/min/{1.73_m2} — ABNORMAL LOW (ref >59)
GFR calc non Af Amer: 51 mL/min/{1.73_m2} — ABNORMAL LOW (ref >59)
Globulin, Total: 2.5 g/dL (ref 1.5–4.5)
Glucose: 90 mg/dL (ref 65–99)
Potassium: 4.1 mmol/L (ref 3.5–5.2)
Sodium: 142 mmol/L (ref 134–144)
Total Protein: 5.8 g/dL — ABNORMAL LOW (ref 6.0–8.5)

## 2020-06-16 LAB — CBC WITH DIFFERENTIAL/PLATELET
Basophils Absolute: 0.1 10*3/uL (ref 0.0–0.2)
Basos: 1 %
EOS (ABSOLUTE): 0 10*3/uL (ref 0.0–0.4)
Eos: 1 %
Hematocrit: 29.9 % — ABNORMAL LOW (ref 34.0–46.6)
Hemoglobin: 9.9 g/dL — ABNORMAL LOW (ref 11.1–15.9)
Immature Grans (Abs): 0 10*3/uL (ref 0.0–0.1)
Immature Granulocytes: 0 %
Lymphocytes Absolute: 1.1 10*3/uL (ref 0.7–3.1)
Lymphs: 19 %
MCH: 30.3 pg (ref 26.6–33.0)
MCHC: 33.1 g/dL (ref 31.5–35.7)
MCV: 91 fL (ref 79–97)
Monocytes Absolute: 0.5 10*3/uL (ref 0.1–0.9)
Monocytes: 8 %
Neutrophils Absolute: 3.9 10*3/uL (ref 1.4–7.0)
Neutrophils: 71 %
Platelets: 143 10*3/uL — ABNORMAL LOW (ref 150–450)
RBC: 3.27 x10E6/uL — ABNORMAL LOW (ref 3.77–5.28)
RDW: 17.4 % — ABNORMAL HIGH (ref 11.7–15.4)
WBC: 5.6 10*3/uL (ref 3.4–10.8)

## 2020-06-16 NOTE — Progress Notes (Signed)
[facility] CSW Progress Notes  Call from Hebert Soho, she has not heard from patient/family re in home care needs.  Edwyna Shell, LCSW Clinical Social Worker Phone:  [phone]

## 2020-06-16 NOTE — Progress Notes (Signed)
[facility] CSW Progress Notes  Call to patient (no answer or VM) and daughter Gabrielle [name] (left VM w my contact information and request for return call).  Reviewed TOC notes from recent inpatient stay, noted SNF was not recommended by PT.  [facility] in place at discharge.  Will check w Alvis Lemmings and [facility] re caregiver support.  Edwyna Shell, LCSW Clinical Social Worker Phone:  [phone] Cell:  [phone]

## 2020-06-16 NOTE — Telephone Encounter (Signed)
[place] CSW Progress Notes  Call from [place], [facility].  They have been unable to contact patient since her recent discharge from inpatient.  They have not made a home health visit since discharge.  They also state that prior to the most recent hospitalization, HH made several visits - patient declined the visits and stated she did not need home  Health.  CSW also spoke w [facility], [facility] [employer] and [employer] - daughter has not called to inquire about any kind of in home aide program. CSW was unable to reach either patient or daughter.  Edwyna Shell, LCSW Clinical Social Worker Phone:  [phone] Cell:  [phone]

## 2020-06-18 DIAGNOSIS — C182 Malignant neoplasm of ascending colon: Secondary | ICD-10-CM | POA: Diagnosis not present

## 2020-06-22 ENCOUNTER — Other Ambulatory Visit: Payer: Self-pay

## 2020-06-22 ENCOUNTER — Inpatient Hospital Stay (HOSPITAL_COMMUNITY): Payer: Medicare HMO

## 2020-06-22 ENCOUNTER — Encounter (HOSPITAL_COMMUNITY): Payer: Self-pay | Admitting: *Deleted

## 2020-06-22 ENCOUNTER — Inpatient Hospital Stay (HOSPITAL_BASED_OUTPATIENT_CLINIC_OR_DEPARTMENT_OTHER): Payer: Medicare HMO | Admitting: Hematology

## 2020-06-22 VITALS — BP 139/50 | HR 65 | Temp 97.9°F | Resp 18

## 2020-06-22 VITALS — BP 152/51 | HR 72 | Temp 97.9°F | Resp 18 | Wt 130.2 lb

## 2020-06-22 DIAGNOSIS — D509 Iron deficiency anemia, unspecified: Secondary | ICD-10-CM | POA: Diagnosis not present

## 2020-06-22 DIAGNOSIS — C182 Malignant neoplasm of ascending colon: Secondary | ICD-10-CM

## 2020-06-22 DIAGNOSIS — Z5111 Encounter for antineoplastic chemotherapy: Secondary | ICD-10-CM | POA: Diagnosis not present

## 2020-06-22 DIAGNOSIS — D500 Iron deficiency anemia secondary to blood loss (chronic): Secondary | ICD-10-CM

## 2020-06-22 DIAGNOSIS — D631 Anemia in chronic kidney disease: Secondary | ICD-10-CM | POA: Diagnosis not present

## 2020-06-22 DIAGNOSIS — N189 Chronic kidney disease, unspecified: Secondary | ICD-10-CM | POA: Diagnosis not present

## 2020-06-22 DIAGNOSIS — C189 Malignant neoplasm of colon, unspecified: Secondary | ICD-10-CM

## 2020-06-22 LAB — COMPREHENSIVE METABOLIC PANEL
ALT: 9 U/L (ref 0–44)
AST: 15 U/L (ref 15–41)
Albumin: 3.5 g/dL (ref 3.5–5.0)
Alkaline Phosphatase: 57 U/L (ref 38–126)
Anion gap: 6 (ref 5–15)
BUN: 18 mg/dL (ref 8–23)
CO2: 27 mmol/L (ref 22–32)
Calcium: 8.9 mg/dL (ref 8.9–10.3)
Chloride: 106 mmol/L (ref 98–111)
Creatinine, Ser: 1.17 mg/dL — ABNORMAL HIGH (ref 0.44–1.00)
GFR calc Af Amer: 54 mL/min — ABNORMAL LOW (ref >60)
GFR calc non Af Amer: 46 mL/min — ABNORMAL LOW (ref >60)
Glucose, Bld: 98 mg/dL (ref 70–99)
Potassium: 4.2 mmol/L (ref 3.5–5.1)
Sodium: 139 mmol/L (ref 135–145)
Total Bilirubin: 0.4 mg/dL (ref 0.3–1.2)
Total Protein: 6.8 g/dL (ref 6.5–8.1)

## 2020-06-22 LAB — CBC WITH DIFFERENTIAL/PLATELET
Abs Immature Granulocytes: 0.01 10*3/uL (ref 0.00–0.07)
Basophils Absolute: 0 10*3/uL (ref 0.0–0.1)
Basophils Relative: 1 %
Eosinophils Absolute: 0.1 10*3/uL (ref 0.0–0.5)
Eosinophils Relative: 3 %
HCT: 33.1 % — ABNORMAL LOW (ref 36.0–46.0)
Hemoglobin: 9.8 g/dL — ABNORMAL LOW (ref 12.0–15.0)
Immature Granulocytes: 0 %
Lymphocytes Relative: 34 %
Lymphs Abs: 1.2 10*3/uL (ref 0.7–4.0)
MCH: 29.3 pg (ref 26.0–34.0)
MCHC: 29.6 g/dL — ABNORMAL LOW (ref 30.0–36.0)
MCV: 98.8 fL (ref 80.0–100.0)
Monocytes Absolute: 0.5 10*3/uL (ref 0.1–1.0)
Monocytes Relative: 13 %
Neutro Abs: 1.8 10*3/uL (ref 1.7–7.7)
Neutrophils Relative %: 49 %
Platelets: 125 10*3/uL — ABNORMAL LOW (ref 150–400)
RBC: 3.35 MIL/uL — ABNORMAL LOW (ref 3.87–5.11)
RDW: 18.6 % — ABNORMAL HIGH (ref 11.5–15.5)
WBC: 3.6 10*3/uL — ABNORMAL LOW (ref 4.0–10.5)
nRBC: 0 % (ref 0.0–0.2)

## 2020-06-22 LAB — MAGNESIUM: Magnesium: 2.1 mg/dL (ref 1.7–2.4)

## 2020-06-22 MED ORDER — HEPARIN SOD (PORK) LOCK FLUSH 100 UNIT/ML IV SOLN
500.0000 [IU] | Freq: Once | INTRAVENOUS | Status: AC | PRN
  Administered 2020-06-22: 500 [IU]

## 2020-06-22 MED ORDER — ACETAMINOPHEN 325 MG PO TABS
650.0000 mg | ORAL_TABLET | Freq: Once | ORAL | Status: AC
  Administered 2020-06-22: 650 mg via ORAL
  Filled 2020-06-22: qty 2

## 2020-06-22 MED ORDER — PROCHLORPERAZINE MALEATE 10 MG PO TABS: 10.0000 mg | ORAL_TABLET | Freq: Four times a day (QID) | ORAL | 0 refills | Status: DC | PRN

## 2020-06-22 MED ORDER — SODIUM CHLORIDE 0.9 % IV SOLN: Freq: Once | INTRAVENOUS | Status: AC

## 2020-06-22 MED ORDER — FAMOTIDINE 20 MG PO TABS
20.0000 mg | ORAL_TABLET | Freq: Once | ORAL | Status: AC
  Administered 2020-06-22: 20 mg via ORAL
  Filled 2020-06-22: qty 1

## 2020-06-22 MED ORDER — SODIUM CHLORIDE 0.9% FLUSH: 10.0000 mL | INTRAVENOUS | Status: DC | PRN

## 2020-06-22 MED ORDER — SODIUM CHLORIDE 0.9 % IV SOLN
510.0000 mg | Freq: Once | INTRAVENOUS | Status: AC
  Administered 2020-06-22: 510 mg via INTRAVENOUS
  Filled 2020-06-22: qty 510

## 2020-06-22 MED ORDER — LORATADINE 10 MG PO TABS
10.0000 mg | ORAL_TABLET | Freq: Once | ORAL | Status: AC
  Administered 2020-06-22: 10 mg via ORAL
  Filled 2020-06-22: qty 1

## 2020-06-22 MED ORDER — SODIUM CHLORIDE 0.9% FLUSH
10.0000 mL | Freq: Once | INTRAVENOUS | Status: AC | PRN
  Administered 2020-06-22: 10 mL

## 2020-06-22 NOTE — Progress Notes (Signed)
Patient has been assessed, vital signs and labs have been reviewed by Dr. Delton Coombes. HOLD treatment today, give Feraheme today per Dr. Delton Coombes.

## 2020-06-22 NOTE — Progress Notes (Deleted)
[place], [place] [address]   CLINIC:  Medical Oncology/Hematology  PCP:  Janora Norlander, DO [address] [place] [id number] [phone]   REASON FOR VISIT:  Follow-up for right colon cancer  PRIOR THERAPY: Right hemicolectomy on 04/08/2020  NGS Results: Not done  CURRENT THERAPY: FOLFOX  BRIEF ONCOLOGIC HISTORY:  Oncology History  Malignant neoplasm of colon ([place])  04/07/2020 Initial Diagnosis   Malignant neoplasm of ascending colon ([place])   05/13/2020 Genetic Testing   [facility]     05/19/2020 -  Chemotherapy   The patient had palonosetron (ALOXI) injection 0.25 mg, 0.25 mg, Intravenous,  Once, 1 of 12 cycles Administration: 0.25 mg (05/19/2020) leucovorin 522 mg in dextrose 5 % 250 mL infusion, 320 mg/m2 = 522 mg (80 % of original dose 400 mg/m2), Intravenous,  Once, 1 of 12 cycles Dose modification: 320 mg/m2 (80 % of original dose 400 mg/m2, Cycle 1, Reason: Provider Judgment) Administration: 522 mg (05/19/2020) oxaliplatin (ELOXATIN) 110 mg in dextrose 5 % 500 mL chemo infusion, 68 mg/m2 = 110 mg (80 % of original dose 85 mg/m2), Intravenous,  Once, 1 of 12 cycles Dose modification: 68 mg/m2 (80 % of original dose 85 mg/m2, Cycle 1, Reason: Provider Judgment) Administration: 110 mg (05/19/2020) fluorouracil (ADRUCIL) chemo injection 500 mg, 320 mg/m2 = 500 mg (80 % of original dose 400 mg/m2), Intravenous,  Once, 1 of 12 cycles Dose modification: 320 mg/m2 (80 % of original dose 400 mg/m2, Cycle 1, Reason: Provider Judgment) Administration: 500 mg (05/19/2020) fluorouracil (ADRUCIL) 3,150 mg in sodium chloride 0.9 % 87 mL chemo infusion, 1,920 mg/m2 = 3,150 mg (80 % of original dose 2,400 mg/m2), Intravenous, 1 Day/Dose, 1 of 12 cycles Dose modification: 1,920 mg/m2 (80 % of original dose 2,400 mg/m2, Cycle 1, Reason: Provider Judgment) Administration: 3,150 mg (05/19/2020)  for chemotherapy treatment.      CANCER  STAGING: Cancer Staging Malignant neoplasm of colon [facility]) Staging form: Colon and Rectum, AJCC 8th Edition - Clinical stage from 04/27/2020: Stage IIIB (cT4a, cN1a, cM0) - Unsigned   INTERVAL HISTORY:  [name], a 74 y.o. female, returns for routine follow-up and consideration for inital cycle of chemotherapy. Oliviah was last seen on 05/13/2020.  Due for cycle #1 of FOLFOX today.   Overall, she tells me she has been feeling pretty well and reports no issues. She is still ambulating. She denies abdominal pain or N/V. She has daily BMs with soft stool without the use of stool softeners. She denies having any numbness or tingling.  Overall, she feels ready for next cycle of chemo today.    REVIEW OF SYSTEMS:  Review of Systems  Constitutional: Positive for appetite change (mildly decreased) and fatigue (mild).  Gastrointestinal: Negative for abdominal pain, nausea and vomiting.  All other systems reviewed and are negative.   PAST MEDICAL/SURGICAL HISTORY:  Past Medical History:  Diagnosis Date  . Arthritis   . Cancer ([place])   . Cataract    OU  . Colon cancer ([place])   . H/O cesarean section   . Hx of tonsillectomy   . Hyperlipidemia   . Hypertension   . Macular degeneration    Exu ARMD OU   Past Surgical History:  Procedure Laterality Date  . BIOPSY  04/06/2020   Procedure: BIOPSY;  Surgeon: Rogene Houston, MD;  Location: [facility] ENDO SU[facility];  Service: Endoscopy;;  . CARPAL TUNNEL RELEASE Right   .  CATARACT EXTRACTION W/PHACO Left 10/11/2019   Procedure: CATARACT EXTRACTION PHACO AND INTRAOCULAR LENS PLACEMENT (IOC);  Surgeon: Baruch Goldmann, MD;  Location: [facility] ORS;  Service: Ophthalmology;  Laterality: Left;  CDE: 8.56  . CATARACT EXTRACTION W/PHACO Right 10/25/2019   Procedure: CATARACT EXTRACTION PHACO AND INTRAOCULAR LENS PLACEMENT (IOC);  Surgeon: Baruch Goldmann, MD;  Location: [facility] ORS;  Service: Ophthalmology;  Laterality: Right;  CDE: 5.67  . CESAREAN SECTION    .  COLONOSCOPY N/A 04/06/2020   Procedure: COLONOSCOPY;  Surgeon: Rogene Houston, MD;  Location: [facility] ENDO SU[facility];  Service: Endoscopy;  Laterality: N/A;  . CYSTOSCOPY WITH BIOPSY N/A 04/08/2020   Procedure: CYSTOSCOPY WITH BIOPSY;  Surgeon: Cleon Gustin, MD;  Location: [facility] ORS;  Service: Urology;  Laterality: N/A;  . ESOPHAGOGASTRODUODENOSCOPY N/A 04/05/2020   Procedure: ESOPHAGOGASTRODUODENOSCOPY (EGD);  Surgeon: Rogene Houston, MD;  Location: [facility];  Service: Endoscopy;  Laterality: N/A;  . PARTIAL COLECTOMY N/A 04/08/2020   Procedure: PARTIAL COLECTOMY;  Surgeon: Virl Cagey, MD;  Location: [facility] ORS;  Service: General;  Laterality: N/A;  . PORTACATH PLACEMENT Left 05/18/2020   Procedure: INSERTION PORT-A-CATH (ATTACHED CATHETER IN LEFT SUBCLAVIAN);  Surgeon: Virl Cagey, MD;  Location: [facility] ORS;  Service: General;  Laterality: Left;  . TONSILLECTOMY      SOCIAL HISTORY:  Social History   Socioeconomic History  . Marital status: Divorced    Spouse name: Not on file  . Number of children: Not on file  . Years of education: Not on file  . Highest education level: Not on file  Occupational History  . Not on file  Tobacco Use  . Smoking status: Never Smoker  . Smokeless tobacco: Never Used  Vaping Use  . Vaping Use: Never used  Substance and Sexual Activity  . Alcohol use: No  . Drug use: No  . Sexual activity: Not Currently  Other Topics Concern  . Not on file  Social History Narrative  . Not on file   Social Determinants of Health   Financial Resource Strain: Medium Risk  . Difficulty of Paying Living Expenses: Somewhat hard  Food Insecurity: Food Insecurity Present  . Worried About Charity fundraiser in the Last Year: Sometimes true  . Ran Out of Food in the Last Year: Never true  Transportation Needs: Unmet Transportation Needs  . Lack of Transportation (Medical): Yes  . Lack of Transportation (Non-Medical): Yes  Physical Activity: Insufficiently  Active  . Days of Exercise per Week: 7 days  . Minutes of Exercise per Session: 20 min  Stress: Stress Concern Present  . Feeling of Stress : Very much  Social Connections: Moderately Integrated  . Frequency of Communication with Friends and Family: More than three times a week  . Frequency of Social Gatherings with Friends and Family: Once a week  . Attends Religious Services: More than 4 times per year  . Active Member of Clubs or Organizations: Yes  . Attends Archivist Meetings: Never  . Marital Status: Divorced  Human resources officer Violence: Not At Risk  . Fear of Current or Ex-Partner: No  . Emotionally Abused: No  . Physically Abused: No  . Sexually Abused: No    FAMILY HISTORY:  Family History  Problem Relation Age of Onset  . Macular degeneration Mother   . Stroke Mother   . Hypertension Mother   . Dementia Mother   . Cancer Father        lung  . Diabetes  Sister   . Hypertension Sister     CURRENT MEDICATIONS:  Current Outpatient Medications  Medication Sig Dispense Refill  . acetaminophen (TYLENOL) 500 MG tablet Take 1,000 mg by mouth every 6 (six) hours as needed for moderate pain.     Marland Kitchen alendronate (FOSAMAX) 70 MG tablet Take 1 tablet (70 mg total) by mouth every 7 (seven) days. Take with a full glass of water on an empty stomach. 4 tablet 11  . amLODipine (NORVASC) 5 MG tablet Take 1 tablet (5 mg total) by mouth daily. 30 tablet 1  . atorvastatin (LIPITOR) 20 MG tablet Take 1 tablet (20 mg total) by mouth daily. 90 tablet 3  . cholecalciferol (VITAMIN D3) 25 MCG (1000 UT) tablet Take 1,000 Units by mouth daily.    . diclofenac Sodium (VOLTAREN) 1 % GEL Apply 2 g topically 4 (four) times daily.    . dorzolamide-timolol (COSOPT) 22.3-6.8 MG/ML ophthalmic solution Place 1 drop into both eyes daily. 10 mL 2  . ferrous sulfate 325 (65 FE) MG EC tablet Take 1 tablet (325 mg total) by mouth 2 (two) times daily with a meal. 180 tablet 3  . fluorouracil CALGB  80702 in sodium chloride 0.9 % 150 mL Inject into the vein over 48 hr.    . FLUOROURACIL IV Inject into the vein every 14 (fourteen) days.    Marland Kitchen LEUCOVORIN CALCIUM IV Inject into the vein every 14 (fourteen) days.    Marland Kitchen lidocaine-prilocaine (EMLA) cream Apply a small amount to port a cath site and cover with plastic wrap 1 hour prior to chemotherapy appointments 30 g 0  . OXALIPLATIN IV Inject into the vein every 14 (fourteen) days.    . potassium chloride SA (KLOR-CON) 20 MEQ tablet Take 1 tablet (20 mEq total) by mouth daily. 90 tablet 1  . prochlorperazine (COMPAZINE) 10 MG tablet Take 1 tablet (10 mg total) by mouth every 6 (six) hours as needed for nausea or vomiting. 30 tablet 0  . saccharomyces boulardii (FLORASTOR) 250 MG capsule Take 1 capsule (250 mg total) by mouth 2 (two) times daily. 60 capsule 2   No current facility-administered medications for this visit.    ALLERGIES:  Allergies  Allergen Reactions  . Lisinopril Cough    PHYSICAL EXAM:  Performance status (ECOG): 1 - Symptomatic but completely ambulatory  Vitals:   06/22/20 0808  BP: (!) 152/51  Pulse: 72  Resp: 18  Temp: 97.9 F (36.6 C)  SpO2: 100%   Wt Readings from Last 3 Encounters:  06/22/20 130 lb 3.2 oz (59.1 kg)  06/15/20 132 lb 9.6 oz (60.1 kg)  06/05/20 134 lb 14.7 oz (61.2 kg)   Physical Exam Vitals reviewed.  Constitutional:      Appearance: Normal appearance.  Cardiovascular:     Rate and Rhythm: Normal rate and regular rhythm.     Heart sounds: Normal heart sounds.  Pulmonary:     Effort: Pulmonary effort is normal.     Breath sounds: Normal breath sounds.  Abdominal:     General: There is no distension.     Palpations: Abdomen is soft. There is no mass.  Neurological:     General: No focal deficit present.     Mental Status: She is alert and oriented to person, place, and time.  Psychiatric:        Mood and Affect: Mood normal.        Behavior: Behavior normal.     LABORATORY  DATA:  I have  reviewed the labs as listed.  CBC Latest Ref Rng & Units 06/22/2020 06/15/2020 06/05/2020  WBC 4.0 - 10.5 K/uL 3.6(L) 5.6 7.0  Hemoglobin 12.0 - 15.0 g/dL 9.8(L) 9.9(L) 8.5(L)  Hematocrit 36 - 46 % 33.1(L) 29.9(L) 28.4(L)  Platelets 150 - 400 K/uL 125(L) 143(L) 232   CMP Latest Ref Rng & Units 06/15/2020 06/05/2020 06/04/2020  Glucose 65 - 99 mg/dL 90 110(H) 92  BUN 8 - 27 mg/dL '11 15 19  ' Creatinine 0.57 - 1.00 mg/dL 1.08(H) 1.05(H) 1.29(H)  Sodium 134 - 144 mmol/L 142 137 138  Potassium 3.5 - 5.2 mmol/L 4.1 3.4(L) 3.3(L)  Chloride 96 - 106 mmol/L 105 116(H) 114(H)  CO2 20 - 29 mmol/L 26 16(L) 18(L)  Calcium 8.7 - 10.3 mg/dL 9.1 7.5(L) 7.8(L)  Total Protein 6.0 - 8.5 g/dL 5.8(L) 4.5(L) 4.7(L)  Total Bilirubin 0.0 - 1.2 mg/dL 0.2 0.3 0.3  Alkaline Phos 48 - 121 IU/L 72 59 68  AST 0 - 40 IU/L 18 8(L) 7(L)  ALT 0 - 32 IU/L '5 5 6    ' DIAGNOSTIC IMAGING:  I have independently reviewed the scans and discussed with the patient. CT ABDOMEN PELVIS WO CONTRAST  Result Date: 06/02/2020 CLINICAL DATA:  Vomiting and diarrhea. Ascending colon cancer, prior right hemicolectomy. EXAM: CT ABDOMEN AND PELVIS WITHOUT CONTRAST TECHNIQUE: Multidetector CT imaging of the abdomen and pelvis was performed following the standard protocol without IV contrast. COMPARISON:  PET-CT 05/11/2020 and CT abdomen from 04/06/2020 FINDINGS: Lower chest: Descending thoracic aortic atherosclerotic calcification. Mild mitral and aortic valve calcification. Low-density blood pool raise the possibility of anemia. Small right and trace left pleural effusion. Mild cardiomegaly. Bandlike density in the left lower lobe favoring atelectasis. Left lower lobe nodule 0.5 cm in diameter on image 14/4, stable. Subtle nodularity medially in the right lower lobe on images 29-34 of series 4, nonspecific. There is some faint geographic ground-glass opacities in the lower lobes Hepatobiliary: Borderline dilated gallbladder. No significant  extrahepatic biliary dilatation. No obvious noncontrast CT abnormality of the hepatic parenchyma. Pancreas: Unremarkable Spleen: Unremarkable Adrenals/Urinary Tract: Both adrenal glands appear normal. Hyperdense peripheral 6 mm lesion of the right kidney upper pole on image 33/2 is stable and probably a complex cyst but technically nonspecific. Other hypodense bilateral renal lesions are probably cysts. No hydronephrosis or hydroureter. There is diffuse urinary bladder wall thickening which could reflect cystitis but was also present on the prior PET-CT from 05/11/2020. Stomach/Bowel: Right hemicolectomy with diffuse wall thickening in the remaining colon especially the descending colon and sigmoid colon, with air-fluid levels. There is some mildly dilated loops of small bowel in the abdomen along with some additional borderline dilated loops. A few small segments of small-bowel are not particularly dilated. There scattered air-fluid levels in the small bowel loops no specific lead point for obstruction identified. No pneumatosis or compelling findings of breakdown of the coloenteric anastomotic site in the right abdomen. Vascular/Lymphatic: Aortoiliac atherosclerotic vascular disease. No pathologic adenopathy. Reproductive: Unremarkable Other: New mild ascites, most notable along the paracolic gutters and in the perihepatic and perisplenic regions. New mesenteric edema. Musculoskeletal: Thoracic and lumbar spondylosis. Mid and lower lumbar degenerative disc disease. IMPRESSION: 1. Diffuse wall thickening in the remaining colon especially the descending colon and sigmoid colon, with air-fluid levels. There is some mildly dilated loops of small bowel in the abdomen along with scattered air-fluid levels. No specific lead point for obstruction identified. Appearance suggests enterocolitis potentially with ileus. Given the thickening in the distal colon, consider  testing for C difficile toxin. Strictly speaking on  today's noncontrast examination I cannot exclude ischemia, but no pneumatosis is observed. 2. New mild ascites and mesenteric edema. 3. Small right and trace left pleural effusions. 4. Low-density blood pool raise the possibility of anemia. 5. Mild cardiomegaly. Mild mitral and aortic valve calcification. 6. Stable 0.5 cm in diameter left lower lobe pulmonary nodule, nonspecific. 7. Hyperdense peripheral 6 mm lesion of the right kidney upper pole is stable and probably a complex cyst but technically nonspecific. Other hypodense bilateral renal lesions are probably cysts. 8. Subtle nodularity medially in the right lower lobe on images 29-34 of series 4, nonspecific. 9. Diffuse urinary bladder wall thickening could reflect cystitis but was also present on the prior PET-CT from 05/11/2020. 10. Aortic atherosclerosis. Aortic Atherosclerosis (ICD10-I70.0). Electronically Signed   By: Van Clines M.D.   On: 06/02/2020 12:49   DG ABD ACUTE 2+V W 1V CHEST  Result Date: 06/02/2020 CLINICAL DATA:  Abdominal distention.  Diarrhea. EXAM: DG ABDOMEN ACUTE W/ 1V CHEST COMPARISON:  Radiographs dated 05/28/2020 FINDINGS: The heart size and pulmonary vascularity are normal. Power port tip in the superior vena cava, unchanged. Minimal linear atelectasis at the left lung base. The lungs are otherwise clear. There are multiple new slightly distended small bowel loops in the mid abdomen. There is air scattered throughout the nondistended colon. The stomach is not distended. No visible free air or free fluid.  No acute bone abnormality. IMPRESSION: New slightly distended small bowel loops in the mid abdomen. This could represent ileus or partial small bowel obstruction. Electronically Signed   By: Lorriane Shire M.D.   On: 06/02/2020 11:06   DG Abdomen Acute W/Chest  Result Date: 05/28/2020 CLINICAL DATA:  Vomiting and diarrhea. Colon cancer, active chemotherapy. EXAM: DG ABDOMEN ACUTE W/ 1V CHEST COMPARISON:  Chest  radiograph 05/18/2020 FINDINGS: Left chest port remains in place with tip in the region of the upper SVC. Unchanged heart size and mediastinal contours. No acute airspace disease, pleural effusion or pneumothorax. No evidence of bowel obstruction. Scattered air throughout nondilated small bowel centrally and in the right abdomen. Air within the transverse colon which is slightly featureless. No evidence of free air. No radiopaque calculi or abnormal soft tissue calcifications. Enteric sutures noted in the right abdomen. No acute osseous abnormalities are seen. IMPRESSION: 1. Air within the transverse colon which is featureless and can be seen in the setting of diarrheal process. No evidence of bowel obstruction. 2. No acute chest findings. Electronically Signed   By: Keith Rake M.D.   On: 05/28/2020 01:41     ASSESSMENT:  1. Stage IIIb (T4AN1A) poorly differentiated right colon adenocarcinoma: -Right hemicolectomy on 04/07/2020 with poorly differentiated adenocarcinoma, pT4a, positive radial margin, 1/15 lymph nodes positive, loss of MLH1 and PMS2, bladder biopsy negative for malignancy. -CTAP on 04/06/2020 showed circumferential ascending colon mass with numerous borderline enlarged pericolonic lymph nodes.  No findings of hepatic metastatic disease. -CEA on 04/04/2020 was 12.2.  CEA improved to 1.7 on 05/13/2020. -PET scan on 05/11/2020 shows mild FDG uptake associated with the anastomotic site.  Small pulmonary nodules that do not show elevated FDG activity, remain nonspecific, subcentimeter.  Persistent but improved bladder wall thickening.  2.  Family history: -Paternal uncle had colon cancer.  Maternal aunt had brain cancer and another maternal aunt had gynecological malignancy.  Father had lung cancer and was a smoker.  3.  Diffuse erythema and nodularity of the dome of the bladder: -CT  scan showed severely thickened bladder wall. -Cystoscopy on 04/08/2020 showed diffuse erythema, nodularity in  the posterior wall tracking to the dome.  Ureteral orifices were in normal locations.  Biopsies were benign.   PLAN:  1. Stage IIIb (T4AN1A) poorly differentiated right colon adenocarcinoma: -We have ordered MLH1 methylation testing and BRAF mutation testing. -She had port placed yesterday.  Port site is within normal limits.  I have reviewed her labs.  LFTs are normal.  White count and platelets are normal. -She will proceed with first cycle of FOLFOX.  Will start at 20% dose reduction.  I talked about the side effects in detail again.  We will see her back in 1 week to see how she is tolerating it.  Next cycle will be in 2 weeks.  2. Normocytic anemia: -This is from combination of CKD and iron deficiency. -We discussed about the side effects of Feraheme.  She will proceed with first infusion today.  3. Family history: -She was referred for genetic testing.  4. Diffuse erythema nodularity of the dome of the bladder: -No hematuria.   Orders placed this encounter:  No orders of the defined types were placed in this encounter.    Derek Jack, MD [place] [phone]   I, Milinda Antis, am acting as a scribe for Dr. Sanda Linger.  I, Derek Jack MD, have reviewed the above documentation for accuracy and completeness, and I agree with the above.

## 2020-06-22 NOTE — Progress Notes (Signed)
Patient tolerated iron infusion with no complaints voiced.  Port site clean and dry with no blood return noted before or after infusion.  Patient denied pain with flush and port flushed easily.  Band aid applied.  VSS with discharge and left in satisfactory condition with no s/s of distress noted.

## 2020-06-22 NOTE — Progress Notes (Signed)
[place], [place] [address]   CLINIC:  Medical Oncology/Hematology  PCP:  Janora Norlander, DO [address] [place] [address] [phone]   REASON FOR VISIT:  Follow-up for right colon cancer  PRIOR THERAPY: Right hemicolectomy on 04/08/2020  NGS Results: Not done  CURRENT THERAPY: FOLFOX  BRIEF ONCOLOGIC HISTORY:  Oncology History  Malignant neoplasm of colon ([place])  04/07/2020 Initial Diagnosis   Malignant neoplasm of ascending colon ([place])   05/13/2020 Genetic Testing   [facility]     05/19/2020 -  Chemotherapy   The patient had palonosetron (ALOXI) injection 0.25 mg, 0.25 mg, Intravenous,  Once, 1 of 12 cycles Administration: 0.25 mg (05/19/2020) leucovorin 522 mg in dextrose 5 % 250 mL infusion, 320 mg/m2 = 522 mg (80 % of original dose 400 mg/m2), Intravenous,  Once, 1 of 12 cycles Dose modification: 320 mg/m2 (80 % of original dose 400 mg/m2, Cycle 1, Reason: Provider Judgment) Administration: 522 mg (05/19/2020) oxaliplatin (ELOXATIN) 110 mg in dextrose 5 % 500 mL chemo infusion, 68 mg/m2 = 110 mg (80 % of original dose 85 mg/m2), Intravenous,  Once, 1 of 12 cycles Dose modification: 68 mg/m2 (80 % of original dose 85 mg/m2, Cycle 1, Reason: Provider Judgment) Administration: 110 mg (05/19/2020) fluorouracil (ADRUCIL) chemo injection 500 mg, 320 mg/m2 = 500 mg (80 % of original dose 400 mg/m2), Intravenous,  Once, 1 of 12 cycles Dose modification: 320 mg/m2 (80 % of original dose 400 mg/m2, Cycle 1, Reason: Provider Judgment) Administration: 500 mg (05/19/2020) fluorouracil (ADRUCIL) 3,150 mg in sodium chloride 0.9 % 87 mL chemo infusion, 1,920 mg/m2 = 3,150 mg (80 % of original dose 2,400 mg/m2), Intravenous, 1 Day/Dose, 1 of 12 cycles Dose modification: 1,920 mg/m2 (80 % of original dose 2,400 mg/m2, Cycle 1, Reason: Provider Judgment) Administration: 3,150 mg (05/19/2020)  for chemotherapy treatment.      CANCER  STAGING: Cancer Staging Malignant neoplasm of colon [facility] Staging form: Colon and Rectum, AJCC 8th Edition - Clinical stage from 04/27/2020: Stage IIIB (cT4a, cN1a, cM0) - Unsigned   INTERVAL HISTORY:  Ms. BRAYLI KLINGBEIL, a 74 y.o. female, returns for routine follow-up and consideration for next cycle of chemotherapy. Saraya was last seen on 05/19/2020.  Due for cycle #2 of FOLFOX and Aloxi today.   Today she is accompanied by her daughter. She reported tolerating the first treatment well for a week, then she started having multiple bouts of severe diarrhea and vomiting on the second week and was taken to the hospital multiple times and stayed in the ICU. She is doing better now, since she is living with her daughter. She reports having soft stools and 2 BM daily for the past 2 days; she reports having diarrhea until 2 days ago. Her appetite is good and she walks twice daily.  Overall, she feels ready for next cycle of chemo today.    REVIEW OF SYSTEMS:  Review of Systems  Constitutional: Positive for appetite change (mild) and fatigue (mild).  Neurological: Positive for numbness (fingertips).  All other systems reviewed and are negative.   PAST MEDICAL/SURGICAL HISTORY:  Past Medical History:  Diagnosis Date  . Arthritis   . Cancer ([place])   . Cataract    OU  . Colon cancer ([place])   . H/O cesarean section   . Hx of tonsillectomy   . Hyperlipidemia   . Hypertension   . Macular degeneration    Exu ARMD  OU   Past Surgical History:  Procedure Laterality Date  . BIOPSY  04/06/2020   Procedure: BIOPSY;  Surgeon: Rogene Houston, MD;  Location: [facility] END[facility] SU[facility];  Service: Endoscopy;;  . CARPAL TUNNEL RELEASE Right   . CATARACT EXTRACTION W/PHACO Left 10/11/2019   Procedure: CATARACT EXTRACTION PHACO AND INTRAOCULAR LENS PLACEMENT (IOC);  Surgeon: Baruch Goldmann, MD;  Location: [facility] ORS;  Service: Ophthalmology;  Laterality: Left;  CDE: 8.56  . CATARACT EXTRACTION W/PHACO Right  10/25/2019   Procedure: CATARACT EXTRACTION PHACO AND INTRAOCULAR LENS PLACEMENT (IOC);  Surgeon: Baruch Goldmann, MD;  Location: [facility] ORS;  Service: Ophthalmology;  Laterality: Right;  CDE: 5.67  . CESAREAN SECTION    . COLONOSCOPY N/A 04/06/2020   Procedure: COLONOSCOPY;  Surgeon: Rogene Houston, MD;  Location: [facility] ENDO SUITE;  Service: Endoscopy;  Laterality: N/A;  . CYSTOSCOPY WITH BIOPSY N/A 04/08/2020   Procedure: CYSTOSCOPY WITH BIOPSY;  Surgeon: Cleon Gustin, MD;  Location: [facility] ORS;  Service: Urology;  Laterality: N/A;  . ESOPHAGOGASTRODUODENOSCOPY N/A 04/05/2020   Procedure: ESOPHAGOGASTRODUODENOSCOPY (EGD);  Surgeon: Rogene Houston, MD;  Location[facility];  Service: Endoscopy;  Laterality: N/A;  . PARTIAL COLECTOMY N/A 04/08/2020   Procedure: PARTIAL COLECTOMY;  Surgeon: Virl Cagey, MD;  Location: [facility] ORS;  Service: General;  Laterality: N/A;  . PORTACATH PLACEMENT Left 05/18/2020   Procedure: INSERTION PORT-A-CATH (ATTACHED CATHETER IN LEFT SUBCLAVIAN);  Surgeon: Virl Cagey, MD;  Location: [facility] ORS;  Service: General;  Laterality: Left;  . TONSILLECTOMY      SOCIAL HISTORY:  Social History   Socioeconomic History  . Marital status: Divorced    Spouse name: Not on file  . Number of children: Not on file  . Years of education: Not on file  . Highest education level: Not on file  Occupational History  . Not on file  Tobacco Use  . Smoking status: Never Smoker  . Smokeless tobacco: Never Used  Vaping Use  . Vaping Use: Never used  Substance and Sexual Activity  . Alcohol use: No  . Drug use: No  . Sexual activity: Not Currently  Other Topics Concern  . Not on file  Social History Narrative  . Not on file   Social Determinants of Health   Financial Resource Strain: Medium Risk  . Difficulty of Paying Living Expenses: Somewhat hard  Food Insecurity: Food Insecurity Present  . Worried About Charity fundraiser in the Last Year: Sometimes true  . Ran  Out of Food in the Last Year: Never true  Transportation Needs: Unmet Transportation Needs  . Lack of Transportation (Medical): Yes  . Lack of Transportation (Non-Medical): Yes  Physical Activity: Insufficiently Active  . Days of Exercise per Week: 7 days  . Minutes of Exercise per Session: 20 min  Stress: Stress Concern Present  . Feeling of Stress : Very much  Social Connections: Moderately Integrated  . Frequency of Communication with Friends and Family: More than three times a week  . Frequency of Social Gatherings with Friends and Family: Once a week  . Attends Religious Services: More than 4 times per year  . Active Member of Clubs or Organizations: Yes  . Attends Archivist Meetings: Never  . Marital Status: Divorced  Human resources officer Violence: Not At Risk  . Fear of Current or Ex-Partner: No  . Emotionally Abused: No  . Physically Abused: No  . Sexually Abused: No    FAMILY HISTORY:  Family History  Problem Relation Age of Onset  . Macular degeneration Mother   . Stroke Mother   . Hypertension Mother   . Dementia Mother   . Cancer Father        lung  . Diabetes Sister   . Hypertension Sister     CURRENT MEDICATIONS:  Current Outpatient Medications  Medication Sig Dispense Refill  . alendronate (FOSAMAX) 70 MG tablet Take 1 tablet (70 mg total) by mouth every 7 (seven) days. Take with a full glass of water on an empty stomach. 4 tablet 11  . amLODipine (NORVASC) 5 MG tablet Take 1 tablet (5 mg total) by mouth daily. 30 tablet 1  . atorvastatin (LIPITOR) 20 MG tablet Take 1 tablet (20 mg total) by mouth daily. 90 tablet 3  . cholecalciferol (VITAMIN D3) 25 MCG (1000 UT) tablet Take 1,000 Units by mouth daily.    . dorzolamide-timolol (COSOPT) 22.3-6.8 MG/ML ophthalmic solution Place 1 drop into both eyes daily. 10 mL 2  . ferrous sulfate 325 (65 FE) MG EC tablet Take 1 tablet (325 mg total) by mouth 2 (two) times daily with a meal. 180 tablet 3  .  fluorouracil CALGB 22025 in sodium chloride 0.9 % 150 mL Inject into the vein over 48 hr.    . FLUOROURACIL IV Inject into the vein every 14 (fourteen) days.    Marland Kitchen LEUCOVORIN CALCIUM IV Inject into the vein every 14 (fourteen) days.    . potassium chloride SA (KLOR-CON) 20 MEQ tablet Take 1 tablet (20 mEq total) by mouth daily. 90 tablet 1  . saccharomyces boulardii (FLORASTOR) 250 MG capsule Take 1 capsule (250 mg total) by mouth 2 (two) times daily. 60 capsule 2  . acetaminophen (TYLENOL) 500 MG tablet Take 1,000 mg by mouth every 6 (six) hours as needed for moderate pain.  (Patient not taking: Reported on 06/22/2020)    . diclofenac Sodium (VOLTAREN) 1 % GEL Apply 2 g topically 4 (four) times daily. (Patient not taking: Reported on 06/22/2020)    . lidocaine-prilocaine (EMLA) cream Apply a small amount to port a cath site and cover with plastic wrap 1 hour prior to chemotherapy appointments (Patient not taking: Reported on 06/22/2020) 30 g 0  . OXALIPLATIN IV Inject into the vein every 14 (fourteen) days.    . prochlorperazine (COMPAZINE) 10 MG tablet Take 1 tablet (10 mg total) by mouth every 6 (six) hours as needed for nausea or vomiting. 30 tablet 0   No current facility-administered medications for this visit.    ALLERGIES:  Allergies  Allergen Reactions  . Lisinopril Cough    PHYSICAL EXAM:  Performance status (ECOG): 1 - Symptomatic but completely ambulatory  Vitals:   06/22/20 0808  BP: (!) 152/51  Pulse: 72  Resp: 18  Temp: 97.9 F (36.6 C)  SpO2: 100%   Wt Readings from Last 3 Encounters:  06/22/20 130 lb 3.2 oz (59.1 kg)  06/15/20 132 lb 9.6 oz (60.1 kg)  06/05/20 134 lb 14.7 oz (61.2 kg)   Physical Exam Vitals reviewed.  Constitutional:      Appearance: Normal appearance.  Cardiovascular:     Rate and Rhythm: Normal rate and regular rhythm.     Pulses: Normal pulses.     Heart sounds: Normal heart sounds.  Pulmonary:     Effort: Pulmonary effort is normal.      Breath sounds: Normal breath sounds.  Chest:     Comments: Port on L chest Abdominal:     Palpations:  Abdomen is soft. There is no mass.     Tenderness: There is no abdominal tenderness.  Musculoskeletal:     Right lower leg: Edema (trace) present.     Left lower leg: Edema (trace) present.  Neurological:     General: No focal deficit present.     Mental Status: She is alert and oriented to person, place, and time.  Psychiatric:        Mood and Affect: Mood normal.        Behavior: Behavior normal.     LABORATORY DATA:  I have reviewed the labs as listed.  CBC Latest Ref Rng & Units 06/22/2020 06/15/2020 06/05/2020  WBC 4.0 - 10.5 K/uL 3.6(L) 5.6 7.0  Hemoglobin 12.0 - 15.0 g/dL 9.8(L) 9.9(L) 8.5(L)  Hematocrit 36 - 46 % 33.1(L) 29.9(L) 28.4(L)  Platelets 150 - 400 K/uL 125(L) 143(L) 232   CMP Latest Ref Rng & Units 06/22/2020 06/15/2020 06/05/2020  Glucose 70 - 99 mg/dL 98 90 110(H)  BUN 8 - 23 mg/dL '18 11 15  ' Creatinine 0.44 - 1.00 mg/dL 1.17(H) 1.08(H) 1.05(H)  Sodium 135 - 145 mmol/L 139 142 137  Potassium 3.5 - 5.1 mmol/L 4.2 4.1 3.4(L)  Chloride 98 - 111 mmol/L 106 105 116(H)  CO2 22 - 32 mmol/L 27 26 16(L)  Calcium 8.9 - 10.3 mg/dL 8.9 9.1 7.5(L)  Total Protein 6.5 - 8.1 g/dL 6.8 5.8(L) 4.5(L)  Total Bilirubin 0.3 - 1.2 mg/dL 0.4 0.2 0.3  Alkaline Phos 38 - 126 U/L 57 72 59  AST 15 - 41 U/L 15 18 8(L)  ALT 0 - 44 U/L '9 5 5   ' Lab Results  Component Value Date   TIBC 396 05/13/2020   TIBC 395 05/13/2020   TIBC 336 03/25/2020   FERRITIN 65 05/13/2020   FERRITIN 64 05/13/2020   FERRITIN 17 03/25/2020   IRONPCTSAT 23 05/13/2020   IRONPCTSAT 23 05/13/2020   IRONPCTSAT 5 (LL) 03/25/2020    DIAGNOSTIC IMAGING:  I have independently reviewed the scans and discussed with the patient. CT ABDOMEN PELVIS WO CONTRAST  Result Date: 06/02/2020 CLINICAL DATA:  Vomiting and diarrhea. Ascending colon cancer, prior right hemicolectomy. EXAM: CT ABDOMEN AND PELVIS WITHOUT CONTRAST  TECHNIQUE: Multidetector CT imaging of the abdomen and pelvis was performed following the standard protocol without IV contrast. COMPARISON:  PET-CT 05/11/2020 and CT abdomen from 04/06/2020 FINDINGS: Lower chest: Descending thoracic aortic atherosclerotic calcification. Mild mitral and aortic valve calcification. Low-density blood pool raise the possibility of anemia. Small right and trace left pleural effusion. Mild cardiomegaly. Bandlike density in the left lower lobe favoring atelectasis. Left lower lobe nodule 0.5 cm in diameter on image 14/4, stable. Subtle nodularity medially in the right lower lobe on images 29-34 of series 4, nonspecific. There is some faint geographic ground-glass opacities in the lower lobes Hepatobiliary: Borderline dilated gallbladder. No significant extrahepatic biliary dilatation. No obvious noncontrast CT abnormality of the hepatic parenchyma. Pancreas: Unremarkable Spleen: Unremarkable Adrenals/Urinary Tract: Both adrenal glands appear normal. Hyperdense peripheral 6 mm lesion of the right kidney upper pole on image 33/2 is stable and probably a complex cyst but technically nonspecific. Other hypodense bilateral renal lesions are probably cysts. No hydronephrosis or hydroureter. There is diffuse urinary bladder wall thickening which could reflect cystitis but was also present on the prior PET-CT from 05/11/2020. Stomach/Bowel: Right hemicolectomy with diffuse wall thickening in the remaining colon especially the descending colon and sigmoid colon, with air-fluid levels. There is some mildly dilated loops of  small bowel in the abdomen along with some additional borderline dilated loops. A few small segments of small-bowel are not particularly dilated. There scattered air-fluid levels in the small bowel loops no specific lead point for obstruction identified. No pneumatosis or compelling findings of breakdown of the coloenteric anastomotic site in the right abdomen.  Vascular/Lymphatic: Aortoiliac atherosclerotic vascular disease. No pathologic adenopathy. Reproductive: Unremarkable Other: New mild ascites, most notable along the paracolic gutters and in the perihepatic and perisplenic regions. New mesenteric edema. Musculoskeletal: Thoracic and lumbar spondylosis. Mid and lower lumbar degenerative disc disease. IMPRESSION: 1. Diffuse wall thickening in the remaining colon especially the descending colon and sigmoid colon, with air-fluid levels. There is some mildly dilated loops of small bowel in the abdomen along with scattered air-fluid levels. No specific lead point for obstruction identified. Appearance suggests enterocolitis potentially with ileus. Given the thickening in the distal colon, consider testing for C difficile toxin. Strictly speaking on today's noncontrast examination I cannot exclude ischemia, but no pneumatosis is observed. 2. New mild ascites and mesenteric edema. 3. Small right and trace left pleural effusions. 4. Low-density blood pool raise the possibility of anemia. 5. Mild cardiomegaly. Mild mitral and aortic valve calcification. 6. Stable 0.5 cm in diameter left lower lobe pulmonary nodule, nonspecific. 7. Hyperdense peripheral 6 mm lesion of the right kidney upper pole is stable and probably a complex cyst but technically nonspecific. Other hypodense bilateral renal lesions are probably cysts. 8. Subtle nodularity medially in the right lower lobe on images 29-34 of series 4, nonspecific. 9. Diffuse urinary bladder wall thickening could reflect cystitis but was also present on the prior PET-CT from 05/11/2020. 10. Aortic atherosclerosis. Aortic Atherosclerosis (ICD10-I70.0). Electronically Signed   By: Van Clines M.D.   On: 06/02/2020 12:49   DG ABD ACUTE 2+V W 1V CHEST  Result Date: 06/02/2020 CLINICAL DATA:  Abdominal distention.  Diarrhea. EXAM: DG ABDOMEN ACUTE W/ 1V CHEST COMPARISON:  Radiographs dated 05/28/2020 FINDINGS: The heart  size and pulmonary vascularity are normal. Power port tip in the superior vena cava, unchanged. Minimal linear atelectasis at the left lung base. The lungs are otherwise clear. There are multiple new slightly distended small bowel loops in the mid abdomen. There is air scattered throughout the nondistended colon. The stomach is not distended. No visible free air or free fluid.  No acute bone abnormality. IMPRESSION: New slightly distended small bowel loops in the mid abdomen. This could represent ileus or partial small bowel obstruction. Electronically Signed   By: Lorriane Shire M.D.   On: 06/02/2020 11:06   DG Abdomen Acute W/Chest  Result Date: 05/28/2020 CLINICAL DATA:  Vomiting and diarrhea. Colon cancer, active chemotherapy. EXAM: DG ABDOMEN ACUTE W/ 1V CHEST COMPARISON:  Chest radiograph 05/18/2020 FINDINGS: Left chest port remains in place with tip in the region of the upper SVC. Unchanged heart size and mediastinal contours. No acute airspace disease, pleural effusion or pneumothorax. No evidence of bowel obstruction. Scattered air throughout nondilated small bowel centrally and in the right abdomen. Air within the transverse colon which is slightly featureless. No evidence of free air. No radiopaque calculi or abnormal soft tissue calcifications. Enteric sutures noted in the right abdomen. No acute osseous abnormalities are seen. IMPRESSION: 1. Air within the transverse colon which is featureless and can be seen in the setting of diarrheal process. No evidence of bowel obstruction. 2. No acute chest findings. Electronically Signed   By: Keith Rake M.D.   On: 05/28/2020 01:41  ASSESSMENT:  1. Stage IIIb (T4AN1A) poorly differentiated right colon adenocarcinoma: -Right hemicolectomy on 04/07/2020 with poorly differentiated adenocarcinoma, pT4a, positive radial margin, 1/15 lymph nodes positive, loss of MLH1 and PMS2, bladder biopsy negative for malignancy. -CTAP on 04/06/2020 showed  circumferential ascending colon mass with numerous borderline enlarged pericolonic lymph nodes. No findings of hepatic metastatic disease. -CEA on 04/04/2020 was 12.2. CEA improved to 1.7 on 05/13/2020. -PET scan on 05/11/2020 shows mild FDG uptake associated with the anastomotic site. Small pulmonary nodules that do not show elevated FDG activity, remain nonspecific, subcentimeter. Persistent but improved bladder wall thickening.  2. Family history: -Paternal uncle had colon cancer. Maternal aunt had brain cancer and another maternal aunt had gynecological malignancy. Father had lung cancer and was a smoker.  3. Diffuse erythema and nodularity of the dome of the bladder: -CT scan showed severely thickened bladder wall. -Cystoscopy on 04/08/2020 showed diffuse erythema, nodularity in the posterior wall tracking to the dome. Ureteral orifices were in normal locations. Biopsies were benign.   PLAN:  1. Stage IIIb (T4AN1A) poorly differentiated right colon adenocarcinoma: -We have ordered BRAF mutation testing and MLH1 hyper methylation testing. -Cycle 1 of dose reduced FOLFOX on 05/19/2020. -She had 2 admissions to the hospital after the first cycle.  During the first admission she had diarrhea and weakness and was found to have acute kidney injury.  During second admission she was diagnosed with C. difficile.  She is slowly recovering.  She is having formed stools at this time. -I have reviewed her labs.  Electrolytes including potassium and magnesium are normal.  LFTs are normal.  Creatinine is elevated at 1.17.  White count is also low at 3.6 and platelet count 125. -I would hold off on treatment today.  I will reevaluate her in 1 week.  If she feels stronger, will consider infusional 5-FU and leucovorin due to poor tolerance.  We also clearly discussed the implications of not receiving adjuvant chemotherapy in this high risk setting.  2. Normocytic anemia: -Combination anemia from CKD  and iron deficiency.  Feraheme on 05/19/2020.  We will give a second dose for him today.  3. Family history: -She was referred for genetic testing.  4. Diffuse erythema nodularity of the dome of the bladder: -No further hematuria reported.   Orders placed this encounter:  No orders of the defined types were placed in this encounter.    Derek Jack, MD [place] [phone]   I, Milinda Antis, am acting as a scribe for Dr. Sanda Linger.  I, Derek Jack MD, have reviewed the above documentation for accuracy and completeness, and I agree with the above.

## 2020-06-22 NOTE — Patient Instructions (Addendum)
[place] at [facility] Discharge Instructions  You were seen today by Dr. Delton Coombes. He went over your recent test results. You received an iron infusion today. He will see you back in 1 week for labs and follow up.   Thank you for choosing [place] at [facility] to provide your oncology and hematology care.  To afford each patient quality time with our provider, please arrive at least 15 minutes before your scheduled appointment time.   If you have a lab appointment with the [facility] please come in thru the  Main Entrance and check in at the main information desk  You need to re-schedule your appointment should you arrive 10 or more minutes late.  We strive to give you quality time with our providers, and arriving late affects you and other patients whose appointments are after yours.  Also, if you no show three or more times for appointments you may be dismissed from the clinic at the providers discretion.     Again, thank you for choosing [facility].  Our hope is that these requests will decrease the amount of time that you wait before being seen by our physicians.       _____________________________________________________________  Should you have questions after your visit to [facility], please contact our office at [phone] between the hours of 8:00 a.m. and 4:30 p.m.  Voicemails left after 4:00 p.m. will not be returned until the following business day.  For prescription refill requests, have your pharmacy contact our office and allow 72 hours.    Cancer Center Support Programs:   > Cancer Support Group  2nd Tuesday of the month 1pm-2pm, Journey Room

## 2020-06-22 NOTE — Progress Notes (Signed)
Office notes, labs, pathology faxed to [facility] for approval of genetic testing done 05/13/20.

## 2020-06-23 ENCOUNTER — Other Ambulatory Visit (INDEPENDENT_AMBULATORY_CARE_PROVIDER_SITE_OTHER): Payer: Self-pay | Admitting: Ophthalmology

## 2020-06-23 NOTE — Progress Notes (Signed)
[facility] Note  06/25/2020     CHIEF COMPLAINT Patient presents for Retina Follow Up   HISTORY OF PRESENT ILLNESS: Gabrielle [name] is a 74 y.o. female who presents to the clinic today for:   HPI    Retina Follow Up    Patient presents with  Wet AMD.  In both eyes.  Severity is moderate.  Duration of 8 weeks.  Since onset it is stable.  I, the attending physician,  performed the HPI with the patient and updated documentation appropriately.          Comments    Patient states vision getting slightly worse, especially OS. OS watering more frequently. On cosopt qd OU.        Last edited by Bernarda Caffey, MD on 06/25/2020  8:38 AM. (History)    pt has been in and out of the hospital twice since the last time she was here, she is receiving chemo every 2 weeks, pt states her eyes have been watering a lot  Referring physician: Janora Norlander, DO [place],  [place] [address]  HISTORICAL INFORMATION:   Selected notes from the [facility] Referred by Dr. Cristela Blue for concern of ARMD OU;  LEE- 01.03.19 Cristela Blue) [BCVA OD: 10/50 OS: 20/80] Ocular Hx- ARMD OU, DME PMH- elevated chol., HTN    CURRENT MEDICATIONS: Current Outpatient Medications (Ophthalmic Drugs)  Medication Sig  . dorzolamide-timolol (COSOPT) 22.3-6.8 MG/ML ophthalmic solution Place 1 drop into both eyes daily.   No current facility-administered medications for this visit. (Ophthalmic Drugs)   Current Outpatient Medications (Other)  Medication Sig  . alendronate (FOSAMAX) 70 MG tablet Take 1 tablet (70 mg total) by mouth every 7 (seven) days. Take with a full glass of water on an empty stomach.  Marland Kitchen amLODipine (NORVASC) 5 MG tablet Take 1 tablet (5 mg total) by mouth daily.  Marland Kitchen atorvastatin (LIPITOR) 20 MG tablet Take 1 tablet (20 mg total) by mouth daily.  . cholecalciferol (VITAMIN D3) 25 MCG (1000 UT) tablet Take 1,000 Units by mouth daily.  . diclofenac  Sodium (VOLTAREN) 1 % GEL Apply 2 g topically 4 (four) times daily.   . ferrous sulfate 325 (65 FE) MG EC tablet Take 1 tablet (325 mg total) by mouth 2 (two) times daily with a meal.  . fluorouracil CALGB 35701 in sodium chloride 0.9 % 150 mL Inject into the vein over 48 hr.  . FLUOROURACIL IV Inject into the vein every 14 (fourteen) days.  Marland Kitchen LEUCOVORIN CALCIUM IV Inject into the vein every 14 (fourteen) days.  . OXALIPLATIN IV Inject into the vein every 14 (fourteen) days.  . potassium chloride SA (KLOR-CON) 20 MEQ tablet Take 1 tablet (20 mEq total) by mouth daily.  Marland Kitchen saccharomyces boulardii (FLORASTOR) 250 MG capsule Take 1 capsule (250 mg total) by mouth 2 (two) times daily.  Marland Kitchen acetaminophen (TYLENOL) 500 MG tablet Take 1,000 mg by mouth every 6 (six) hours as needed for moderate pain.  (Patient not taking: Reported on 06/22/2020)  . lidocaine-prilocaine (EMLA) cream Apply a small amount to port a cath site and cover with plastic wrap 1 hour prior to chemotherapy appointments (Patient not taking: Reported on 06/22/2020)  . prochlorperazine (COMPAZINE) 10 MG tablet Take 1 tablet (10 mg total) by mouth every 6 (six) hours as needed for nausea or vomiting.   No current facility-administered medications for this visit. (Other)      REVIEW  OF SYSTEMS: ROS    Positive for: Gastrointestinal, Musculoskeletal, Eyes   Negative for: Constitutional, Neurological, Skin, Genitourinary, HENT, Endocrine, Cardiovascular, Respiratory, Psychiatric, Allergic/Imm, Heme/Lymph   Last edited by Roselee Nova D, COT on 06/25/2020  7:54 AM. (History)       ALLERGIES Allergies  Allergen Reactions  . Lisinopril Cough    PAST MEDICAL HISTORY Past Medical History:  Diagnosis Date  . Arthritis   . Cancer ([place])   . Cataract    OU  . Colon cancer ([place])   . H/O cesarean section   . Hx of tonsillectomy   . Hyperlipidemia   . Hypertension   . Macular degeneration    Exu ARMD OU   Past Surgical History:   Procedure Laterality Date  . BIOPSY  04/06/2020   Procedure: BIOPSY;  Surgeon: Rogene Houston, MD;  Location: [facility];  Service: Endoscopy;;  . CARPAL TUNNEL RELEASE Right   . CATARACT EXTRACTION W/PHACO Left 10/11/2019   Procedure: CATARACT EXTRACTION PHACO AND INTRAOCULAR LENS PLACEMENT (IOC);  Surgeon: Baruch Goldmann, MD;  Location: [facility] OR[facility];  Service: Ophthalmology;  Laterality: Left;  CDE: 8.56  . CATARACT EXTRACTION W/PHACO Right 10/25/2019   Procedure: CATARACT EXTRACTION PHACO AND INTRAOCULAR LENS PLACEMENT (IOC);  Surgeon: Baruch Goldmann, MD;  Location: [facility];  Service: Ophthalmology;  Laterality: Right;  CDE: 5.67  . CESAREAN SECTION    . COLONOSCOPY N/A 04/06/2020   Procedure: COLONOSCOPY;  Surgeon: Rogene Houston, MD;  Location: [facility];  Service: Endoscopy;  Laterality: N/A;  . CYSTOSCOPY WITH BIOPSY N/A 04/08/2020   Procedure: CYSTOSCOPY WITH BIOPSY;  Surgeon: Cleon Gustin, MD;  Location: [facility];  Service: Urology;  Laterality: N/A;  . ESOPHAGOGASTRODUODENOSCOPY N/A 04/05/2020   Procedure: ESOPHAGOGASTRODUODENOSCOPY (EGD);  Surgeon: Rogene Houston, MD;  [facility];  Service: Endoscopy;  Laterality: N/A;  . PARTIAL COLECTOMY N/A 04/08/2020   Procedure: PARTIAL COLECTOMY;  Surgeon: Virl Cagey, MD;  Location: [facility];  Service: General;  Laterality: N/A;  . PORTACATH PLACEMENT Left 05/18/2020   Procedure: INSERTION PORT-A-CATH (ATTACHED CATHETER IN LEFT SUBCLAVIAN);  Surgeon: Virl Cagey, MD;  Location: [facility];  Service: General;  Laterality: Left;  . TONSILLECTOMY      FAMILY HISTORY Family History  Problem Relation Age of Onset  . Macular degeneration Mother   . Stroke Mother   . Hypertension Mother   . Dementia Mother   . Cancer Father        lung  . Diabetes Sister   . Hypertension Sister     SOCIAL HISTORY Social History   Tobacco Use  . Smoking status: Never Smoker  . Smokeless tobacco: Never Used  Vaping Use  .  Vaping Use: Never used  Substance Use Topics  . Alcohol use: No  . Drug use: No         OPHTHALMIC EXAM:  Base Eye Exam    Visual Acuity (Snellen - Linear)      Right Left   Dist cc CF at 3' 20/80 -2   Dist ph cc NI NI   Correction: Glasses       Tonometry (Tonopen, 8:08 AM)      Right Left   Pressure 15 14       Pupils      Dark Light Shape React APD   Right 4 3 Round Brisk None   Left 4 3 Round Brisk None       Visual Fields (Counting  fingers)      Left Right    Full Full       Extraocular Movement      Right Left    Full, Ortho Full, Ortho       Neuro/Psych    Oriented x3: Yes   Mood/Affect: Normal       Dilation    Both eyes: 1.0% Mydriacyl, 2.5% Phenylephrine @ 8:08 AM        Slit Lamp and Fundus Exam    Slit Lamp Exam      Right Left   Lids/Lashes Dermatochalasis - upper lid Dermatochalasis - upper lid   Conjunctiva/Sclera White and quiet White and quiet   Cornea Arcus, 1-2+ Punctate epithelial erosions Arcus, 1-2+ Punctate epithelial erosions   Anterior Chamber Deep and quiet Deep and quiet   Iris Round and well dilated Round and well dilated   Lens PCIOL in good position PCIOL in good position   Vitreous Vitreous syneresis, Posterior vitreous detachment Vitreous syneresis, Posterior vitreous detachment       Fundus Exam      Right Left   Disc pink and sharp, compact pink and sharp, Compact, vascular loops superiorly   C/D Ratio 0.2 0.2   Macula Blunted foveal reflex, Drusen, Pigment clumping and atrophy, central thickening/disciform scar, +PED, no heme, interval increase in IRF/cystic changes overlying central scar Blunted foveal reflex, central thickening, RPE clumping and atrophy, Drusen, RPE rip, mild IRF/SRF overlying PED -- persistent   Vessels Vascular attenuation, Tortuous Vascular attenuation, Tortuous   Periphery Attached, mild Reticular degeneration Attached, mild Reticular degeneration        Refraction    Wearing Rx       Sphere Cylinder Add   Right [place] Sphere +3.50   Left -0.50 Sphere +3.50   Type: PAL       Manifest Refraction      Sphere Cylinder Axis Dist VA   Right -1.25 +0.75 177 CF   Left -1.25 +1.00 180 20/100          IMAGING AND PROCEDURES  Imaging and Procedures for 03/21/18  OCT, Retina - OU - Both Eyes       Right Eye Quality was good. Central Foveal Thickness: 520. Progression has worsened. Findings include pigment epithelial detachment, epiretinal membrane, subretinal hyper-reflective material, retinal drusen , abnormal foveal contour, no SRF, intraretinal fluid, disciform scar, outer retinal tubulation, outer retinal atrophy (Stable sub-retinal scar, interval increase in IRF overlying SRHM).   Left Eye Quality was good. Central Foveal Thickness: 349. Progression has been stable. Findings include subretinal hyper-reflective material, pigment epithelial detachment, intraretinal fluid, retinal drusen , abnormal foveal contour, no SRF, outer retinal atrophy (persistent IRF overlying SRHM/PED).   Notes *Images captured and stored on drive  Diagnosis / Impression:  Exudative ARMD OU OD: Stable sub-retinal scar, interval increase in IRF overlying SRHM OS: tr persistent IRF overlying SRHM/PED  Clinical management:  See below  Abbreviations: NFP - Normal foveal profile. CME - cystoid macular edema. PED - pigment epithelial detachment. IRF - intraretinal fluid. SRF - subretinal fluid. EZ - ellipsoid zone. ERM - epiretinal membrane. ORA - outer retinal atrophy. ORT - outer retinal tubulation. SRHM - subretinal hyper-reflective material        Intravitreal Injection, Pharmacologic Agent - OS - Left Eye       Time Out 06/25/2020. 8:02 AM. Confirmed correct patient, procedure, site, and patient consented.   Anesthesia Topical anesthesia was used. Anesthetic medications included Lidocaine 2%, Proparacaine 0.5%.  Procedure Preparation included 5% betadine to ocular surface,  eyelid speculum. A (33g) needle was used.   Injection:  2 mg aflibercept Alfonse Flavors) SOLN   NDC: A3590391, Lot: [id number], Expiration date: 09/04/2020   Route: Intravitreal, Site: Left Eye, Waste: 0.05 mg  Post-op Post injection exam found visual acuity of at least counting fingers. The patient tolerated the procedure well. There were no complications. The patient received written and verbal post procedure care education.        Intravitreal Injection, Pharmacologic Agent - OD - Right Eye       Time Out 06/25/2020. 8:36 AM. Confirmed correct patient, procedure, site, and patient consented.   Anesthesia Topical anesthesia was used. Anesthetic medications included Lidocaine 2%, Proparacaine 0.5%.   Procedure Preparation included 5% betadine to ocular surface, eyelid speculum. A (33g) needle was used.   Injection:  2 mg aflibercept Alfonse Flavors) SOLN   NDC: A3590391, Lot: [id number], Expiration date: 09/04/2020   Route: Intravitreal, Site: Right Eye, Waste: 0.05 mg  Post-op Post injection exam found visual acuity of at least counting fingers. The patient tolerated the procedure well. There were no complications. The patient received written and verbal post procedure care education.                 ASSESSMENT/PLAN:    ICD-10-CM   1. Exudative age-related macular degeneration of both eyes with active choroidal neovascularization (HCC)  H35.3231 Intravitreal Injection, Pharmacologic Agent - OS - Left Eye    Intravitreal Injection, Pharmacologic Agent - OD - Right Eye    aflibercept (EYLEA) SOLN 2 mg    aflibercept (EYLEA) SOLN 2 mg  2. Retinal edema  H35.81 OCT, Retina - OU - Both Eyes  3. Posterior vitreous detachment of both eyes  H43.813   4. Pseudophakia of both eyes  Z96.1   5. Ocular hypertension, bilateral  H40.053     1,2. Exudative age related macular degeneration, both eyes.    - severe exudative disease with very active CNVM OU at presentation in January  2019  - S/P IVA OD #1 (01.04.19), #2 (02.15.19)  - S/P IVA OS #1 (01.18.19), #2 (02.15.19)  - switched to Eylea 3.18.19 due to severity of disease  - S/P IVE OD #1 (03.18.19), #2 (04.17.19), #3 (05.15.19) -- injections held due to stable disciform scar, #4 (10.02.19),  #5 (01.15.20), #6 (06.04.20), #7 (10.29.20), #8 (01.21.21), #9 (05.27.21)  - S/P IVE OS #1 (03.18.19), #2 (04.17.19), #3 (05.15.19), #4 (06.12.19), #5 (07.10.19), #6 (08.07.19), #7 (09.04.19), #8 (10.02.19), #9 (11.06.19), #10 (12.11.19), #11 (01.15.20), #12 (02.20.20), #13 (03.26.20), #14 (04.30.20), #15 (06.04.20),    #16 (07.16.20), # 17 (08.20.20), #18 (09.24.20), #19 (10.29.20), #20 (12.03.20), #21 (01.21.21), #22 (03.18.21), #23 (05.27.21)  - OCT OS today shows tr persistent IRF overlying PED  - OD with significant subretinal disciform scar - interval increase in IRF overlying IRHM  - BCVA OD down to CF 3' from 20/150; OS 20/80 - stable  - recommend IVE OU (OD #10 and OS #24) today, 07.22.21  - pt wishes to be treated with IVE OU  - RBA of procedure discussed, questions answered  - informed consent obtained  - see procedure note  - Eylea informed consent form signed and scanned on 01.21.2021  - Eylea4U paperwork filled out on 02.15.19 and fully approved through [employer] -- approved  - f/u in 6 wks for DFE/OCT/possible injection OS  3. PVD / vitreous syneresis  - Discussed findings and prognosis  - No RT or RD  on 360  exam  - Reviewed s/s of RT/RD  - Strict return precautions for any such RT/RD signs/symptoms  4. Pseudophakia OU  - s/p CE/IOL (Dr. Marisa Hua, 11.2020)  - beautiful surgeries w/ IOLs in excellent position, doing well  - monitor  5. Ocular Hypertension OU  - IOP okay today -- 15 OD, 14 OS  - cont Cosopt qdaily OU   Ophthalmic Meds Ordered this visit:  Meds ordered this encounter  Medications  . aflibercept (EYLEA) SOLN 2 mg  . aflibercept (EYLEA) SOLN 2 mg       Return in about 6 weeks  (around 08/06/2020) for f/u exu ARMD OU, DFE, OCT.  There are no Patient Instructions on file for this visit.  This document serves as a record of services personally performed by Gardiner Sleeper, MD, PhD. It was created on their behalf by Leonie Douglas, an ophthalmic technician. The creation of this record is the provider's dictation and/or activities during the visit.    Electronically signed by: Leonie Douglas COA, 06/28/20  12:47 AM   This document serves as a record of services personally performed by Gardiner Sleeper, MD, PhD. It was created on their behalf by San Jetty. Owens Shark, OA an ophthalmic technician. The creation of this record is the provider's dictation and/or activities during the visit.    Electronically signed by: San Jetty. Owens Shark, [place] 07.22.2021 12:47 AM   Gardiner Sleeper, M.D., Ph.D. Diseases & Surgery of the Retina and Vitreous [facility]  I have reviewed the above documentation for accuracy and completeness, and I agree with the above. Gardiner Sleeper, M.D., Ph.D. 06/28/20 12:47 AM   Abbreviations: M myopia (nearsighted); A astigmatism; H hyperopia (farsighted); P presbyopia; Mrx spectacle prescription;  CTL contact lenses; OD right eye; OS left eye; OU both eyes  XT exotropia; ET esotropia; PEK punctate epithelial keratitis; PEE punctate epithelial erosions; DES dry eye syndrome; MGD meibomian gland dysfunction; ATs artificial tears; PFAT's preservative free artificial tears; [place] nuclear sclerotic cataract; PSC posterior subcapsular cataract; ERM epi-retinal membrane; PVD posterior vitreous detachment; RD retinal detachment; DM diabetes mellitus; DR diabetic retinopathy; NPDR non-proliferative diabetic retinopathy; PDR proliferative diabetic retinopathy; CSME clinically significant macular edema; DME diabetic macular edema; dbh dot blot hemorrhages; CWS cotton wool spot; POAG primary open angle glaucoma; C/D cup-to-disc ratio; HVF humphrey visual field; GVF  goldmann visual field; OCT optical coherence tomography; IOP intraocular pressure; BRVO Branch retinal vein occlusion; CRVO central retinal vein occlusion; CRAO central retinal artery occlusion; BRAO branch retinal artery occlusion; RT retinal tear; SB scleral buckle; PPV pars plana vitrectomy; VH Vitreous hemorrhage; PRP panretinal laser photocoagulation; IVK intravitreal kenalog; VMT vitreomacular traction; MH Macular hole;  NVD neovascularization of the disc; NVE neovascularization elsewhere; AREDS age related eye disease study; ARMD age related macular degeneration; POAG primary open angle glaucoma; EBMD epithelial/anterior basement membrane dystrophy; ACIOL anterior chamber intraocular lens; IOL intraocular lens; PCIOL posterior chamber intraocular lens; Phaco/IOL phacoemulsification with intraocular lens placement; [place] photorefractive keratectomy; LASIK laser assisted in situ keratomileusis; HTN hypertension; DM diabetes mellitus; COPD chronic obstructive pulmonary disease

## 2020-06-24 ENCOUNTER — Encounter (HOSPITAL_COMMUNITY): Payer: Medicare HMO

## 2020-06-25 ENCOUNTER — Ambulatory Visit (INDEPENDENT_AMBULATORY_CARE_PROVIDER_SITE_OTHER): Payer: Medicare HMO | Admitting: Ophthalmology

## 2020-06-25 ENCOUNTER — Other Ambulatory Visit: Payer: Self-pay

## 2020-06-25 ENCOUNTER — Encounter (INDEPENDENT_AMBULATORY_CARE_PROVIDER_SITE_OTHER): Payer: Self-pay | Admitting: Ophthalmology

## 2020-06-25 DIAGNOSIS — H353231 Exudative age-related macular degeneration, bilateral, with active choroidal neovascularization: Secondary | ICD-10-CM

## 2020-06-25 DIAGNOSIS — H40053 Ocular hypertension, bilateral: Secondary | ICD-10-CM | POA: Diagnosis not present

## 2020-06-25 DIAGNOSIS — Z961 Presence of intraocular lens: Secondary | ICD-10-CM

## 2020-06-25 DIAGNOSIS — H43813 Vitreous degeneration, bilateral: Secondary | ICD-10-CM

## 2020-06-25 DIAGNOSIS — H3581 Retinal edema: Secondary | ICD-10-CM

## 2020-06-28 ENCOUNTER — Encounter (INDEPENDENT_AMBULATORY_CARE_PROVIDER_SITE_OTHER): Payer: Self-pay | Admitting: Ophthalmology

## 2020-06-28 DIAGNOSIS — H43813 Vitreous degeneration, bilateral: Secondary | ICD-10-CM | POA: Diagnosis not present

## 2020-06-28 DIAGNOSIS — H40053 Ocular hypertension, bilateral: Secondary | ICD-10-CM | POA: Diagnosis not present

## 2020-06-28 DIAGNOSIS — Z961 Presence of intraocular lens: Secondary | ICD-10-CM | POA: Diagnosis not present

## 2020-06-28 DIAGNOSIS — H3581 Retinal edema: Secondary | ICD-10-CM | POA: Diagnosis not present

## 2020-06-28 DIAGNOSIS — H353231 Exudative age-related macular degeneration, bilateral, with active choroidal neovascularization: Secondary | ICD-10-CM | POA: Diagnosis not present

## 2020-06-28 MED ORDER — AFLIBERCEPT 2MG/0.05ML IZ SOLN FOR KALEIDOSCOPE
2.0000 mg | INTRAVITREAL | Status: AC | PRN
  Administered 2020-06-28: 2 mg via INTRAVITREAL

## 2020-06-30 ENCOUNTER — Other Ambulatory Visit: Payer: Self-pay

## 2020-06-30 ENCOUNTER — Inpatient Hospital Stay (HOSPITAL_COMMUNITY): Payer: Medicare HMO

## 2020-06-30 ENCOUNTER — Inpatient Hospital Stay (HOSPITAL_BASED_OUTPATIENT_CLINIC_OR_DEPARTMENT_OTHER): Payer: Medicare HMO | Admitting: Hematology

## 2020-06-30 VITALS — BP 129/59 | HR 74 | Temp 97.2°F | Resp 18

## 2020-06-30 VITALS — BP 135/56 | HR 94 | Temp 98.7°F | Resp 18 | Wt 131.4 lb

## 2020-06-30 DIAGNOSIS — C182 Malignant neoplasm of ascending colon: Secondary | ICD-10-CM | POA: Diagnosis not present

## 2020-06-30 DIAGNOSIS — Z5111 Encounter for antineoplastic chemotherapy: Secondary | ICD-10-CM | POA: Diagnosis not present

## 2020-06-30 DIAGNOSIS — D509 Iron deficiency anemia, unspecified: Secondary | ICD-10-CM | POA: Diagnosis not present

## 2020-06-30 DIAGNOSIS — C189 Malignant neoplasm of colon, unspecified: Secondary | ICD-10-CM

## 2020-06-30 DIAGNOSIS — E876 Hypokalemia: Secondary | ICD-10-CM

## 2020-06-30 DIAGNOSIS — N189 Chronic kidney disease, unspecified: Secondary | ICD-10-CM | POA: Diagnosis not present

## 2020-06-30 DIAGNOSIS — D631 Anemia in chronic kidney disease: Secondary | ICD-10-CM | POA: Diagnosis not present

## 2020-06-30 LAB — CBC WITH DIFFERENTIAL/PLATELET
Abs Immature Granulocytes: 0.03 10*3/uL (ref 0.00–0.07)
Basophils Absolute: 0 10*3/uL (ref 0.0–0.1)
Basophils Relative: 0 %
Eosinophils Absolute: 0.2 10*3/uL (ref 0.0–0.5)
Eosinophils Relative: 2 %
HCT: 32.5 % — ABNORMAL LOW (ref 36.0–46.0)
Hemoglobin: 10.3 g/dL — ABNORMAL LOW (ref 12.0–15.0)
Immature Granulocytes: 0 %
Lymphocytes Relative: 11 %
Lymphs Abs: 1.1 10*3/uL (ref 0.7–4.0)
MCH: 30.7 pg (ref 26.0–34.0)
MCHC: 31.7 g/dL (ref 30.0–36.0)
MCV: 97 fL (ref 80.0–100.0)
Monocytes Absolute: 0.8 10*3/uL (ref 0.1–1.0)
Monocytes Relative: 8 %
Neutro Abs: 8 10*3/uL — ABNORMAL HIGH (ref 1.7–7.7)
Neutrophils Relative %: 79 %
Platelets: 149 10*3/uL — ABNORMAL LOW (ref 150–400)
RBC: 3.35 MIL/uL — ABNORMAL LOW (ref 3.87–5.11)
RDW: 16.9 % — ABNORMAL HIGH (ref 11.5–15.5)
WBC: 10.1 10*3/uL (ref 4.0–10.5)
nRBC: 0 % (ref 0.0–0.2)

## 2020-06-30 LAB — COMPREHENSIVE METABOLIC PANEL
ALT: 10 U/L (ref 0–44)
AST: 14 U/L — ABNORMAL LOW (ref 15–41)
Albumin: 3.4 g/dL — ABNORMAL LOW (ref 3.5–5.0)
Alkaline Phosphatase: 92 U/L (ref 38–126)
Anion gap: 10 (ref 5–15)
BUN: 16 mg/dL (ref 8–23)
CO2: 25 mmol/L (ref 22–32)
Calcium: 8.8 mg/dL — ABNORMAL LOW (ref 8.9–10.3)
Chloride: 101 mmol/L (ref 98–111)
Creatinine, Ser: 1.03 mg/dL — ABNORMAL HIGH (ref 0.44–1.00)
GFR calc Af Amer: >60 mL/min (ref >60)
GFR calc non Af Amer: 54 mL/min — ABNORMAL LOW (ref >60)
Glucose, Bld: 107 mg/dL — ABNORMAL HIGH (ref 70–99)
Potassium: 3.3 mmol/L — ABNORMAL LOW (ref 3.5–5.1)
Sodium: 136 mmol/L (ref 135–145)
Total Bilirubin: 0.5 mg/dL (ref 0.3–1.2)
Total Protein: 6.7 g/dL (ref 6.5–8.1)

## 2020-06-30 LAB — MAGNESIUM: Magnesium: 1.8 mg/dL (ref 1.7–2.4)

## 2020-06-30 MED ORDER — SODIUM CHLORIDE 0.9% FLUSH: 10.0000 mL | INTRAVENOUS | Status: DC | PRN

## 2020-06-30 MED ORDER — SODIUM CHLORIDE 0.9 % IV SOLN
1600.0000 mg/m2 | INTRAVENOUS | Status: DC
  Administered 2020-06-30: 2600 mg via INTRAVENOUS
  Filled 2020-06-30: qty 50

## 2020-06-30 MED ORDER — SODIUM CHLORIDE 0.9 % IV SOLN
266.6667 mg/m2 | Freq: Once | INTRAVENOUS | Status: AC
  Administered 2020-06-30: 434 mg via INTRAVENOUS
  Filled 2020-06-30: qty 21.7

## 2020-06-30 MED ORDER — POTASSIUM CHLORIDE CRYS ER 20 MEQ PO TBCR
20.0000 meq | EXTENDED_RELEASE_TABLET | Freq: Once | ORAL | Status: AC
  Administered 2020-06-30: 20 meq via ORAL
  Filled 2020-06-30: qty 1

## 2020-06-30 MED ORDER — FLUOROURACIL CHEMO INJECTION 500 MG/10ML
266.6667 mg/m2 | Freq: Once | INTRAVENOUS | Status: AC
  Administered 2020-06-30: 450 mg via INTRAVENOUS
  Filled 2020-06-30: qty 9

## 2020-06-30 MED ORDER — PALONOSETRON HCL INJECTION 0.25 MG/5ML
0.2500 mg | Freq: Once | INTRAVENOUS | Status: AC
  Administered 2020-06-30: 0.25 mg via INTRAVENOUS
  Filled 2020-06-30: qty 5

## 2020-06-30 MED ORDER — DEXTROSE 5 % IV SOLN: Freq: Once | INTRAVENOUS | Status: AC

## 2020-06-30 MED ORDER — SODIUM CHLORIDE 0.9 % IV SOLN
10.0000 mg | Freq: Once | INTRAVENOUS | Status: AC
  Administered 2020-06-30: 10 mg via INTRAVENOUS
  Filled 2020-06-30: qty 1

## 2020-06-30 NOTE — Progress Notes (Signed)
Patient has been assessed, vital signs and labs have been reviewed by Dr. Delton Coombes. ANC, Creatinine, LFTs, and Platelets are within treatment parameters, potassium is slightly low please give 20 mEq potassium PO today per, treatment has also been dose reduced Dr. Delton Coombes. The patient is good to proceed with treatment at this time.

## 2020-06-30 NOTE — Progress Notes (Signed)
Gabrielle [name], [place] [address]   CLINIC:  Medical Oncology/Hematology  PCP:  Janora Norlander, DO [address] [place] [address] [phone]   REASON FOR VISIT:  Follow-up for right colon cancer  PRIOR THERAPY: Right hemicolectomy on 04/08/2020  NGS Results: KRAS/NRAS wild-type, MSI high, BRAF V600 E  CURRENT THERAPY: FOLFOX  BRIEF ONCOLOGIC HISTORY:  Oncology History  Malignant neoplasm of colon ([place])  04/07/2020 Initial Diagnosis   Malignant neoplasm of ascending colon ([place])   05/13/2020 Genetic Testing   [facility]     05/19/2020 -  Chemotherapy   The patient had palonosetron (ALOXI) injection 0.25 mg, 0.25 mg, Intravenous,  Once, 1 of 12 cycles Administration: 0.25 mg (05/19/2020) leucovorin 522 mg in dextrose 5 % 250 mL infusion, 320 mg/m2 = 522 mg (80 % of original dose 400 mg/m2), Intravenous,  Once, 1 of 12 cycles Dose modification: 320 mg/m2 (80 % of original dose 400 mg/m2, Cycle 1, Reason: Provider Judgment) Administration: 522 mg (05/19/2020) oxaliplatin (ELOXATIN) 110 mg in dextrose 5 % 500 mL chemo infusion, 68 mg/m2 = 110 mg (80 % of original dose 85 mg/m2), Intravenous,  Once, 1 of 12 cycles Dose modification: 68 mg/m2 (80 % of original dose 85 mg/m2, Cycle 1, Reason: Provider Judgment) Administration: 110 mg (05/19/2020) fluorouracil (ADRUCIL) chemo injection 500 mg, 320 mg/m2 = 500 mg (80 % of original dose 400 mg/m2), Intravenous,  Once, 1 of 12 cycles Dose modification: 320 mg/m2 (80 % of original dose 400 mg/m2, Cycle 1, Reason: Provider Judgment) Administration: 500 mg (05/19/2020) fluorouracil (ADRUCIL) 3,150 mg in sodium chloride 0.9 % 87 mL chemo infusion, 1,920 mg/m2 = 3,150 mg (80 % of original dose 2,400 mg/m2), Intravenous, 1 Day/Dose, 1 of 12 cycles Dose modification: 1,920 mg/m2 (80 % of original dose 2,400 mg/m2, Cycle 1, Reason: Provider Judgment) Administration: 3,150 mg (05/19/2020)  for  chemotherapy treatment.      CANCER STAGING: Cancer Staging Malignant neoplasm of colon [facility]) Staging form: Colon and Rectum, AJCC 8th Edition - Clinical stage from 04/27/2020: Stage IIIB (cT4a, cN1a, cM0) - Unsigned   INTERVAL HISTORY:  Gabrielle [name], a 74 y.o. female, returns for routine follow-up and consideration for next cycle of chemotherapy. Nyari was last seen on 06/22/2020.  Due for cycle #2 of FOLFOX today.   Overall, she tells me she has been feeling pretty well. Her appetite has been good so far. She has only had a small decrease in energy. Her weight has been stable.   She is ready for the next cycle of chemotherapy.    REVIEW OF SYSTEMS:  Review of Systems  Constitutional: Positive for fatigue (mild). Negative for appetite change and unexpected weight change.  HENT:  Negative.   Eyes: Negative.   Respiratory: Negative.   Cardiovascular: Negative.   Gastrointestinal: Negative.   Endocrine: Negative.   Genitourinary: Negative.    Musculoskeletal: Negative.   Skin: Negative.   Neurological: Negative.   Hematological: Negative.   Psychiatric/Behavioral: Negative.   All other systems reviewed and are negative.   PAST MEDICAL/SURGICAL HISTORY:  Past Medical History:  Diagnosis Date  . Arthritis   . Cancer ([place])   . Cataract    OU  . Colon cancer ([place])   . H/O cesarean section   . Hx of tonsillectomy   . Hyperlipidemia   . Hypertension   . Macular degeneration    Exu ARMD OU   Past Surgical History:  Procedure Laterality Date  . BIOPSY  04/06/2020   Procedure: BIOPSY;  Surgeon: Rogene Houston, MD;  Location: [facility] END[facility];  Service: Endoscopy;;  . CARPAL TUNNEL RELEASE Right   . CATARACT EXTRACTION W/PHACO Left 10/11/2019   Procedure: CATARACT EXTRACTION PHACO AND INTRAOCULAR LENS PLACEMENT (IOC);  Surgeon: Baruch Goldmann, MD;  Location: [facility] ORS;  Service: Ophthalmology;  Laterality: Left;  CDE: 8.56  . CATARACT EXTRACTION W/PHACO Right  10/25/2019   Procedure: CATARACT EXTRACTION PHACO AND INTRAOCULAR LENS PLACEMENT (IOC);  Surgeon: Baruch Goldmann, MD;  Location: [facility] ORS;  Service: Ophthalmology;  Laterality: Right;  CDE: 5.67  . CESAREAN SECTION    . COLONOSCOPY N/A 04/06/2020   Procedure: COLONOSCOPY;  Surgeon: Rogene Houston, MD;  Location: [facility] ENDO SUITE;  Service: Endoscopy;  Laterality: N/A;  . CYSTOSCOPY WITH BIOPSY N/A 04/08/2020   Procedure: CYSTOSCOPY WITH BIOPSY;  Surgeon: Cleon Gustin, MD;  Location: [facility] ORS;  Service: Urology;  Laterality: N/A;  . ESOPHAGOGASTRODUODENOSCOPY N/A 04/05/2020   Procedure: ESOPHAGOGASTRODUODENOSCOPY (EGD);  Surgeon: Rogene Houston, MD;  Location: [facility] END[facility];  Service: Endoscopy;  Laterality: N/A;  . PARTIAL COLECTOMY N/A 04/08/2020   Procedure: PARTIAL COLECTOMY;  Surgeon: Virl Cagey, MD;  Location: [facility] ORS;  Service: General;  Laterality: N/A;  . PORTACATH PLACEMENT Left 05/18/2020   Procedure: INSERTION PORT-A-CATH (ATTACHED CATHETER IN LEFT SUBCLAVIAN);  Surgeon: Virl Cagey, MD;  Location: [facility] ORS;  Service: General;  Laterality: Left;  . TONSILLECTOMY      SOCIAL HISTORY:  Social History   Socioeconomic History  . Marital status: Divorced    Spouse name: Not on file  . Number of children: Not on file  . Years of education: Not on file  . Highest education level: Not on file  Occupational History  . Not on file  Tobacco Use  . Smoking status: Never Smoker  . Smokeless tobacco: Never Used  Vaping Use  . Vaping Use: Never used  Substance and Sexual Activity  . Alcohol use: No  . Drug use: No  . Sexual activity: Not Currently  Other Topics Concern  . Not on file  Social History Narrative  . Not on file   Social Determinants of Health   Financial Resource Strain: Medium Risk  . Difficulty of Paying Living Expenses: Somewhat hard  Food Insecurity: Food Insecurity Present  . Worried About Charity fundraiser in the Last Year: Sometimes true  . Ran  Out of Food in the Last Year: Never true  Transportation Needs: Unmet Transportation Needs  . Lack of Transportation (Medical): Yes  . Lack of Transportation (Non-Medical): Yes  Physical Activity: Insufficiently Active  . Days of Exercise per Week: 7 days  . Minutes of Exercise per Session: 20 min  Stress: Stress Concern Present  . Feeling of Stress : Very much  Social Connections: Moderately Integrated  . Frequency of Communication with Friends and Family: More than three times a week  . Frequency of Social Gatherings with Friends and Family: Once a week  . Attends Religious Services: More than 4 times per year  . Active Member of Clubs or Organizations: Yes  . Attends Archivist Meetings: Never  . Marital Status: Divorced  Human resources officer Violence: Not At Risk  . Fear of Current or Ex-Partner: No  . Emotionally Abused: No  . Physically Abused: No  . Sexually Abused: No    FAMILY HISTORY:  Family History  Problem Relation Age of Onset  .  Macular degeneration Mother   . Stroke Mother   . Hypertension Mother   . Dementia Mother   . Cancer Father        lung  . Diabetes Sister   . Hypertension Sister     CURRENT MEDICATIONS:  Current Outpatient Medications  Medication Sig Dispense Refill  . alendronate (FOSAMAX) 70 MG tablet Take 1 tablet (70 mg total) by mouth every 7 (seven) days. Take with a full glass of water on an empty stomach. 4 tablet 11  . amLODipine (NORVASC) 5 MG tablet Take 1 tablet (5 mg total) by mouth daily. 30 tablet 1  . atorvastatin (LIPITOR) 20 MG tablet Take 1 tablet (20 mg total) by mouth daily. 90 tablet 3  . cholecalciferol (VITAMIN D3) 25 MCG (1000 UT) tablet Take 1,000 Units by mouth daily.    . dorzolamide-timolol (COSOPT) 22.3-6.8 MG/ML ophthalmic solution Place 1 drop into both eyes daily. 10 mL 2  . ferrous sulfate 325 (65 FE) MG EC tablet Take 1 tablet (325 mg total) by mouth 2 (two) times daily with a meal. 180 tablet 3  .  fluorouracil CALGB 86761 in sodium chloride 0.9 % 150 mL Inject into the vein over 48 hr.    . FLUOROURACIL IV Inject into the vein every 14 (fourteen) days.    Marland Kitchen LEUCOVORIN CALCIUM IV Inject into the vein every 14 (fourteen) days.    Marland Kitchen losartan-hydrochlorothiazide (HYZAAR) 100-25 MG tablet Take 1 tablet by mouth daily.    . OXALIPLATIN IV Inject into the vein every 14 (fourteen) days.    . potassium chloride SA (KLOR-CON) 20 MEQ tablet Take 1 tablet (20 mEq total) by mouth daily. 90 tablet 1  . saccharomyces boulardii (FLORASTOR) 250 MG capsule Take 1 capsule (250 mg total) by mouth 2 (two) times daily. 60 capsule 2  . acetaminophen (TYLENOL) 500 MG tablet Take 1,000 mg by mouth every 6 (six) hours as needed for moderate pain.  (Patient not taking: Reported on 06/22/2020)    . diclofenac Sodium (VOLTAREN) 1 % GEL Apply 2 g topically 4 (four) times daily.  (Patient not taking: Reported on 06/30/2020)    . lidocaine-prilocaine (EMLA) cream Apply a small amount to port a cath site and cover with plastic wrap 1 hour prior to chemotherapy appointments (Patient not taking: Reported on 06/22/2020) 30 g 0  . prochlorperazine (COMPAZINE) 10 MG tablet Take 1 tablet (10 mg total) by mouth every 6 (six) hours as needed for nausea or vomiting. (Patient not taking: Reported on 06/30/2020) 30 tablet 0   No current facility-administered medications for this visit.    ALLERGIES:  Allergies  Allergen Reactions  . Lisinopril Cough    PHYSICAL EXAM:  Performance status (ECOG): 1 - Symptomatic but completely ambulatory  Vitals:   06/30/20 0802  BP: (!) 135/56  Pulse: 94  Resp: 18  Temp: 98.7 F (37.1 C)  SpO2: 99%   Wt Readings from Last 3 Encounters:  06/30/20 131 lb 6.4 oz (59.6 kg)  06/22/20 130 lb 3.2 oz (59.1 kg)  06/15/20 132 lb 9.6 oz (60.1 kg)   Physical Exam Vitals and nursing note reviewed.  Constitutional:      Appearance: Normal appearance.  HENT:     Mouth/Throat:     Mouth: Mucous  membranes are moist.  Eyes:     Pupils: Pupils are equal, round, and reactive to light.  Cardiovascular:     Rate and Rhythm: Normal rate and regular rhythm.  Pulses: Normal pulses.     Heart sounds: Normal heart sounds.  Pulmonary:     Effort: Pulmonary effort is normal.     Breath sounds: Normal breath sounds.  Abdominal:     Palpations: Abdomen is soft. There is no mass.     Tenderness: There is abdominal tenderness in the epigastric area.  Musculoskeletal:     Cervical back: Neck supple.     Right lower leg: No edema.     Left lower leg: No edema.  Neurological:     Mental Status: She is alert and oriented to person, place, and time.  Psychiatric:        Mood and Affect: Mood normal.        Behavior: Behavior normal.     LABORATORY DATA:  I have reviewed the labs as listed.  CBC Latest Ref Rng & Units 06/30/2020 06/22/2020 06/15/2020  WBC 4.0 - 10.5 K/uL 10.1 3.6(L) 5.6  Hemoglobin 12.0 - 15.0 g/dL 10.3(L) 9.8(L) 9.9(L)  Hematocrit 36 - 46 % 32.5(L) 33.1(L) 29.9(L)  Platelets 150 - 400 K/uL 149(L) 125(L) 143(L)   CMP Latest Ref Rng & Units 06/30/2020 06/22/2020 06/15/2020  Glucose 70 - 99 mg/dL 107(H) 98 90  BUN 8 - 23 mg/dL _0 Creatinine 0.44 - 1.00 mg/dL 1.03(H) 1.17(H) 1.08(H)  Sodium 135 - 145 mmol/L 136 139 142  Potassium 3.5 - 5.1 mmol/L 3.3(L) 4.2 4.1  Chloride 98 - 111 mmol/L 101 106 105  CO2 22 - 32 mmol/L _1 Calcium 8.9 - 10.3 mg/dL 8.8(L) 8.9 9.1  Total Protein 6.5 - 8.1 g/dL 6.7 6.8 5.8(L)  Total Bilirubin 0.3 - 1.2 mg/dL 0.5 0.4 0.2  Alkaline Phos 38 - 126 U/L 92 57 72  AST 15 - 41 U/L 14(L) 15 18  ALT 0 - 44 U/L _2 DIAGNOSTIC IMAGING:  I have independently reviewed the scans and discussed with the patient. CT ABDOMEN PELVIS WO CONTRAST  Result Date: 06/02/2020 CLINICAL DATA:  Vomiting and diarrhea. Ascending colon cancer, prior right hemicolectomy. EXAM: CT ABDOMEN AND PELVIS WITHOUT CONTRAST TECHNIQUE: Multidetector CT imaging  of the abdomen and pelvis was performed following the standard protocol without IV contrast. COMPARISON:  PET-CT 05/11/2020 and CT abdomen from 04/06/2020 FINDINGS: Lower chest: Descending thoracic aortic atherosclerotic calcification. Mild mitral and aortic valve calcification. Low-density blood pool raise the possibility of anemia. Small right and trace left pleural effusion. Mild cardiomegaly. Bandlike density in the left lower lobe favoring atelectasis. Left lower lobe nodule 0.5 cm in diameter on image 14/4, stable. Subtle nodularity medially in the right lower lobe on images 29-34 of series 4, nonspecific. There is some faint geographic ground-glass opacities in the lower lobes Hepatobiliary: Borderline dilated gallbladder. No significant extrahepatic biliary dilatation. No obvious noncontrast CT abnormality of the hepatic parenchyma. Pancreas: Unremarkable Spleen: Unremarkable Adrenals/Urinary Tract: Both adrenal glands appear normal. Hyperdense peripheral 6 mm lesion of the right kidney upper pole on image 33/2 is stable and probably a complex cyst but technically nonspecific. Other hypodense bilateral renal lesions are probably cysts. No hydronephrosis or hydroureter. There is diffuse urinary bladder wall thickening which could reflect cystitis but was also present on the prior PET-CT from 05/11/2020. Stomach/Bowel: Right hemicolectomy with diffuse wall thickening in the remaining colon especially the descending colon and sigmoid colon, with air-fluid levels. There is some mildly dilated loops of small bowel in the abdomen along with some additional borderline dilated loops. A few small  segments of small-bowel are not particularly dilated. There scattered air-fluid levels in the small bowel loops no specific lead point for obstruction identified. No pneumatosis or compelling findings of breakdown of the coloenteric anastomotic site in the right abdomen. Vascular/Lymphatic: Aortoiliac atherosclerotic  vascular disease. No pathologic adenopathy. Reproductive: Unremarkable Other: New mild ascites, most notable along the paracolic gutters and in the perihepatic and perisplenic regions. New mesenteric edema. Musculoskeletal: Thoracic and lumbar spondylosis. Mid and lower lumbar degenerative disc disease. IMPRESSION: 1. Diffuse wall thickening in the remaining colon especially the descending colon and sigmoid colon, with air-fluid levels. There is some mildly dilated loops of small bowel in the abdomen along with scattered air-fluid levels. No specific lead point for obstruction identified. Appearance suggests enterocolitis potentially with ileus. Given the thickening in the distal colon, consider testing for C difficile toxin. Strictly speaking on today's noncontrast examination I cannot exclude ischemia, but no pneumatosis is observed. 2. New mild ascites and mesenteric edema. 3. Small right and trace left pleural effusions. 4. Low-density blood pool raise the possibility of anemia. 5. Mild cardiomegaly. Mild mitral and aortic valve calcification. 6. Stable 0.5 cm in diameter left lower lobe pulmonary nodule, nonspecific. 7. Hyperdense peripheral 6 mm lesion of the right kidney upper pole is stable and probably a complex cyst but technically nonspecific. Other hypodense bilateral renal lesions are probably cysts. 8. Subtle nodularity medially in the right lower lobe on images 29-34 of series 4, nonspecific. 9. Diffuse urinary bladder wall thickening could reflect cystitis but was also present on the prior PET-CT from 05/11/2020. 10. Aortic atherosclerosis. Aortic Atherosclerosis (ICD10-I70.0). Electronically Signed   By: Van Clines M.D.   On: 06/02/2020 12:49   DG ABD ACUTE 2+V W 1V CHEST  Result Date: 06/02/2020 CLINICAL DATA:  Abdominal distention.  Diarrhea. EXAM: DG ABDOMEN ACUTE W/ 1V CHEST COMPARISON:  Radiographs dated 05/28/2020 FINDINGS: The heart size and pulmonary vascularity are normal. Power  port tip in the superior vena cava, unchanged. Minimal linear atelectasis at the left lung base. The lungs are otherwise clear. There are multiple new slightly distended small bowel loops in the mid abdomen. There is air scattered throughout the nondistended colon. The stomach is not distended. No visible free air or free fluid.  No acute bone abnormality. IMPRESSION: New slightly distended small bowel loops in the mid abdomen. This could represent ileus or partial small bowel obstruction. Electronically Signed   By: Lorriane Shire M.D.   On: 06/02/2020 11:06   Intravitreal Injection, Pharmacologic Agent - OD - Right Eye  Result Date: 06/28/2020 Time Out 06/25/2020. 8:36 AM. Confirmed correct patient, procedure, site, and patient consented. Anesthesia Topical anesthesia was used. Anesthetic medications included Lidocaine 2%, Proparacaine 0.5%. Procedure Preparation included 5% betadine to ocular surface, eyelid speculum. A (33g) needle was used. Injection: 2 mg aflibercept Alfonse Flavors) SOLN   NDC: A3590391, Lot: [id number], Expiration date: 09/04/2020   Route: Intravitreal, Site: Right Eye, Waste: 0.05 mg Post-op Post injection exam found visual acuity of at least counting fingers. The patient tolerated the procedure well. There were no complications. The patient received written and verbal post procedure care education.   Intravitreal Injection, Pharmacologic Agent - OS - Left Eye  Result Date: 06/28/2020 Time Out 06/25/2020. 8:02 AM. Confirmed correct patient, procedure, site, and patient consented. Anesthesia Topical anesthesia was used. Anesthetic medications included Lidocaine 2%, Proparacaine 0.5%. Procedure Preparation included 5% betadine to ocular surface, eyelid speculum. A (33g) needle was used. Injection: 2 mg aflibercept (EYLEA) SOLN  NDC: L6038910, Lot: [id number], Expiration date: 09/04/2020   Route: Intravitreal, Site: Left Eye, Waste: 0.05 mg Post-op Post injection exam found visual acuity  of at least counting fingers. The patient tolerated the procedure well. There were no complications. The patient received written and verbal post procedure care education.   OCT, Retina - OU - Both Eyes  Result Date: 06/28/2020 Right Eye Quality was good. Central Foveal Thickness: 520. Progression has worsened. Findings include pigment epithelial detachment, epiretinal membrane, subretinal hyper-reflective material, retinal drusen , abnormal foveal contour, no SRF, intraretinal fluid, disciform scar, outer retinal tubulation, outer retinal atrophy (Stable sub-retinal scar, interval increase in IRF overlying SRHM). Left Eye Quality was good. Central Foveal Thickness: 349. Progression has been stable. Findings include subretinal hyper-reflective material, pigment epithelial detachment, intraretinal fluid, retinal drusen , abnormal foveal contour, no SRF, outer retinal atrophy (persistent IRF overlying SRHM/PED). Notes *Images captured and stored on drive Diagnosis / Impression: Exudative ARMD OU OD: Stable sub-retinal scar, interval increase in IRF overlying SRHM OS: tr persistent IRF overlying SRHM/PED Clinical management: See below Abbreviations: NFP - Normal foveal profile. CME - cystoid macular edema. PED - pigment epithelial detachment. IRF - intraretinal fluid. SRF - subretinal fluid. EZ - ellipsoid zone. ERM - epiretinal membrane. ORA - outer retinal atrophy. ORT - outer retinal tubulation. SRHM - subretinal hyper-reflective material     ASSESSMENT:  1. Stage IIIb (T4AN1A) poorly differentiated right colon adenocarcinoma: -Right hemicolectomy on 04/07/2020 with poorly differentiated adenocarcinoma, pT4a, positive radial margin, 1/15 lymph nodes positive, loss of MLH1 and PMS2, bladder biopsy negative for malignancy. -CTAP on 04/06/2020 showed circumferential ascending colon mass with numerous borderline enlarged pericolonic lymph nodes. No findings of hepatic metastatic disease. -CEA on 04/04/2020 was  12.2. CEA improved to 1.7 on 05/13/2020. -PET scan on 05/11/2020 shows mild FDG uptake associated with the anastomotic site. Small pulmonary nodules that do not show elevated FDG activity, remain nonspecific, subcentimeter. Persistent but improved bladder wall thickening. -Cycle 1 of dose reduced FOLFOX on 05/19/2020, followed by 2 hospitalizations, 1 from acute kidney injury from diarrhea and second admission for C. difficile colitis.  2. Family history: -Paternal uncle had colon cancer. Maternal aunt had brain cancer and another maternal aunt had gynecological malignancy. Father had lung cancer and was a smoker.  3. Diffuse erythema and nodularity of the dome of the bladder: -CT scan showed severely thickened bladder wall. -Cystoscopy on 04/08/2020 showed diffuse erythema, nodularity in the posterior wall tracking to the dome. Ureteral orifices were in normal locations. Biopsies were benign.   PLAN:  1. Stage IIIb (T4AN1A) poorly differentiated right colon adenocarcinoma: -She has been feeling well.  I have reviewed her labs which showed normal LFTs and CBC. -Because of poor tolerance with FOLFOX, we will discontinue oxaliplatin.  We will further cut back on the dose of 5-FU by 30%.  We will proceed with cycle 2 today.  If she tolerates well we will plan to increase to 80% dose at next cycle.  2. Normocytic anemia: -Combination anemia from CKD and iron deficiency.  Feraheme on 05/19/2020 and 06/22/2020. -Hemoglobin today's 10.3.  3. Family history: -She was referred for genetic testing.  4. Diffuse erythema nodularity of the dome of the bladder: -Denies any hematuria.   Orders placed this encounter:  No orders of the defined types were placed in this encounter.    Doreatha Massed, MD [facility] [phone]   I, Mal Misty, am acting as a scribe for Dr. Payton Mccallum.  I,  Derek Jack MD, have reviewed the above documentation for  accuracy and completeness, and I agree with the above.

## 2020-06-30 NOTE — Progress Notes (Signed)
Patient tolerated chemotherapy with no complaints voiced.  Side effects with management reviewed with understanding verbalized.  Port site clean and dry with no bruising or swelling noted at site.  Good blood return noted before and after administration of chemotherapy.  Chemotherapy pump connected with no alarms noted.   Patient left in satisfactory condition with VSS and no s/s of distress noted.  

## 2020-06-30 NOTE — Patient Instructions (Signed)
[facility] at [facility] Discharge Instructions  You were seen today by Dr. Katragadda. He went over your recent results. Dr. Katragadda will see you back in for labs and follow up.   Thank you for choosing [facility] at [facility] to provide your oncology and hematology care.  To afford each patient quality time with our provider, please arrive at least 15 minutes before your scheduled appointment time.   If you have a lab appointment with the Cancer Center please come in thru the Main Entrance and check in at the main information desk  You need to re-schedule your appointment should you arrive 10 or more minutes late.  We strive to give you quality time with our providers, and arriving late affects you and other patients whose appointments are after yours.  Also, if you no show three or more times for appointments you may be dismissed from the clinic at the providers discretion.     Again, thank you for choosing [facility].  Our hope is that these requests will decrease the amount of time that you wait before being seen by our physicians.       _____________________________________________________________  Should you have questions after your visit to [facility], please contact our office at [phone] between the hours of 8:00 a.m. and 4:30 p.m.  Voicemails left after 4:00 p.m. will not be returned until the following business day.  For prescription refill requests, have your pharmacy contact our office and allow 72 hours.    Cancer Center Support Programs:   > Cancer Support Group  2nd Tuesday of the month 1pm-2pm, Journey Room    

## 2020-07-02 ENCOUNTER — Inpatient Hospital Stay (HOSPITAL_COMMUNITY): Payer: Medicare HMO

## 2020-07-02 VITALS — BP 131/50 | HR 85 | Temp 97.1°F | Resp 18

## 2020-07-02 DIAGNOSIS — R6521 Severe sepsis with septic shock: Secondary | ICD-10-CM | POA: Diagnosis not present

## 2020-07-02 DIAGNOSIS — M1990 Unspecified osteoarthritis, unspecified site: Secondary | ICD-10-CM | POA: Diagnosis present

## 2020-07-02 DIAGNOSIS — I63312 Cerebral infarction due to thrombosis of left middle cerebral artery: Secondary | ICD-10-CM | POA: Diagnosis not present

## 2020-07-02 DIAGNOSIS — I6623 Occlusion and stenosis of bilateral posterior cerebral arteries: Secondary | ICD-10-CM | POA: Diagnosis not present

## 2020-07-02 DIAGNOSIS — C189 Malignant neoplasm of colon, unspecified: Secondary | ICD-10-CM | POA: Diagnosis not present

## 2020-07-02 DIAGNOSIS — N179 Acute kidney failure, unspecified: Secondary | ICD-10-CM | POA: Diagnosis not present

## 2020-07-02 DIAGNOSIS — H353290 Exudative age-related macular degeneration, unspecified eye, stage unspecified: Secondary | ICD-10-CM | POA: Diagnosis present

## 2020-07-02 DIAGNOSIS — R29705 NIHSS score 5: Secondary | ICD-10-CM | POA: Diagnosis not present

## 2020-07-02 DIAGNOSIS — A0471 Enterocolitis due to Clostridium difficile, recurrent: Secondary | ICD-10-CM | POA: Diagnosis present

## 2020-07-02 DIAGNOSIS — I959 Hypotension, unspecified: Secondary | ICD-10-CM | POA: Diagnosis not present

## 2020-07-02 DIAGNOSIS — E871 Hypo-osmolality and hyponatremia: Secondary | ICD-10-CM | POA: Diagnosis not present

## 2020-07-02 DIAGNOSIS — D500 Iron deficiency anemia secondary to blood loss (chronic): Secondary | ICD-10-CM | POA: Diagnosis not present

## 2020-07-02 DIAGNOSIS — R0689 Other abnormalities of breathing: Secondary | ICD-10-CM | POA: Diagnosis not present

## 2020-07-02 DIAGNOSIS — R000 Tachycardia, unspecified: Secondary | ICD-10-CM | POA: Diagnosis not present

## 2020-07-02 DIAGNOSIS — G459 Transient cerebral ischemic attack, unspecified: Secondary | ICD-10-CM | POA: Diagnosis not present

## 2020-07-02 DIAGNOSIS — I6613 Occlusion and stenosis of bilateral anterior cerebral arteries: Secondary | ICD-10-CM | POA: Diagnosis not present

## 2020-07-02 DIAGNOSIS — Z66 Do not resuscitate: Secondary | ICD-10-CM | POA: Diagnosis not present

## 2020-07-02 DIAGNOSIS — A0472 Enterocolitis due to Clostridium difficile, not specified as recurrent: Secondary | ICD-10-CM | POA: Diagnosis not present

## 2020-07-02 DIAGNOSIS — I129 Hypertensive chronic kidney disease with stage 1 through stage 4 chronic kidney disease, or unspecified chronic kidney disease: Secondary | ICD-10-CM | POA: Diagnosis present

## 2020-07-02 DIAGNOSIS — I10 Essential (primary) hypertension: Secondary | ICD-10-CM | POA: Diagnosis not present

## 2020-07-02 DIAGNOSIS — E785 Hyperlipidemia, unspecified: Secondary | ICD-10-CM | POA: Diagnosis not present

## 2020-07-02 DIAGNOSIS — R918 Other nonspecific abnormal finding of lung field: Secondary | ICD-10-CM | POA: Diagnosis not present

## 2020-07-02 DIAGNOSIS — I6389 Other cerebral infarction: Secondary | ICD-10-CM | POA: Diagnosis not present

## 2020-07-02 DIAGNOSIS — Z961 Presence of intraocular lens: Secondary | ICD-10-CM | POA: Diagnosis present

## 2020-07-02 DIAGNOSIS — Z7409 Other reduced mobility: Secondary | ICD-10-CM | POA: Diagnosis not present

## 2020-07-02 DIAGNOSIS — R471 Dysarthria and anarthria: Secondary | ICD-10-CM | POA: Diagnosis not present

## 2020-07-02 DIAGNOSIS — R4701 Aphasia: Secondary | ICD-10-CM | POA: Diagnosis not present

## 2020-07-02 DIAGNOSIS — I6603 Occlusion and stenosis of bilateral middle cerebral arteries: Secondary | ICD-10-CM | POA: Diagnosis not present

## 2020-07-02 DIAGNOSIS — B952 Enterococcus as the cause of diseases classified elsewhere: Secondary | ICD-10-CM | POA: Diagnosis not present

## 2020-07-02 DIAGNOSIS — Z20822 Contact with and (suspected) exposure to covid-19: Secondary | ICD-10-CM | POA: Diagnosis not present

## 2020-07-02 DIAGNOSIS — Z9841 Cataract extraction status, right eye: Secondary | ICD-10-CM | POA: Diagnosis not present

## 2020-07-02 DIAGNOSIS — N390 Urinary tract infection, site not specified: Secondary | ICD-10-CM | POA: Diagnosis not present

## 2020-07-02 DIAGNOSIS — R404 Transient alteration of awareness: Secondary | ICD-10-CM | POA: Diagnosis not present

## 2020-07-02 DIAGNOSIS — I639 Cerebral infarction, unspecified: Secondary | ICD-10-CM | POA: Diagnosis not present

## 2020-07-02 DIAGNOSIS — E8809 Other disorders of plasma-protein metabolism, not elsewhere classified: Secondary | ICD-10-CM | POA: Diagnosis not present

## 2020-07-02 DIAGNOSIS — E876 Hypokalemia: Secondary | ICD-10-CM | POA: Diagnosis not present

## 2020-07-02 DIAGNOSIS — R29818 Other symptoms and signs involving the nervous system: Secondary | ICD-10-CM | POA: Diagnosis not present

## 2020-07-02 DIAGNOSIS — I480 Paroxysmal atrial fibrillation: Secondary | ICD-10-CM | POA: Diagnosis not present

## 2020-07-02 DIAGNOSIS — R0902 Hypoxemia: Secondary | ICD-10-CM | POA: Diagnosis not present

## 2020-07-02 DIAGNOSIS — D72829 Elevated white blood cell count, unspecified: Secondary | ICD-10-CM | POA: Diagnosis not present

## 2020-07-02 DIAGNOSIS — A419 Sepsis, unspecified organism: Secondary | ICD-10-CM | POA: Diagnosis not present

## 2020-07-02 DIAGNOSIS — E7849 Other hyperlipidemia: Secondary | ICD-10-CM | POA: Diagnosis not present

## 2020-07-02 DIAGNOSIS — Z8619 Personal history of other infectious and parasitic diseases: Secondary | ICD-10-CM | POA: Diagnosis not present

## 2020-07-02 DIAGNOSIS — E119 Type 2 diabetes mellitus without complications: Secondary | ICD-10-CM | POA: Diagnosis not present

## 2020-07-02 DIAGNOSIS — I6350 Cerebral infarction due to unspecified occlusion or stenosis of unspecified cerebral artery: Secondary | ICD-10-CM | POA: Diagnosis not present

## 2020-07-02 DIAGNOSIS — N184 Chronic kidney disease, stage 4 (severe): Secondary | ICD-10-CM | POA: Diagnosis not present

## 2020-07-02 DIAGNOSIS — R197 Diarrhea, unspecified: Secondary | ICD-10-CM | POA: Diagnosis not present

## 2020-07-02 DIAGNOSIS — R4781 Slurred speech: Secondary | ICD-10-CM | POA: Diagnosis present

## 2020-07-02 DIAGNOSIS — Z9842 Cataract extraction status, left eye: Secondary | ICD-10-CM | POA: Diagnosis not present

## 2020-07-02 MED ORDER — SODIUM CHLORIDE 0.9% FLUSH
10.0000 mL | INTRAVENOUS | Status: DC | PRN
  Administered 2020-07-02: 10 mL

## 2020-07-02 MED ORDER — HEPARIN SOD (PORK) LOCK FLUSH 100 UNIT/ML IV SOLN
500.0000 [IU] | Freq: Once | INTRAVENOUS | Status: AC | PRN
  Administered 2020-07-02: 500 [IU]

## 2020-07-04 ENCOUNTER — Emergency Department (HOSPITAL_COMMUNITY): Payer: Medicare HMO

## 2020-07-04 ENCOUNTER — Inpatient Hospital Stay (HOSPITAL_COMMUNITY)
Admission: EM | Admit: 2020-07-04 | Discharge: 2020-07-05 | DRG: 871 | Discharge status: Against Medical Advice | Payer: Medicare HMO | Attending: Family Medicine | Admitting: Family Medicine

## 2020-07-04 ENCOUNTER — Other Ambulatory Visit: Payer: Self-pay

## 2020-07-04 ENCOUNTER — Encounter (HOSPITAL_COMMUNITY): Payer: Self-pay | Admitting: Emergency Medicine

## 2020-07-04 DIAGNOSIS — Z9842 Cataract extraction status, left eye: Secondary | ICD-10-CM | POA: Diagnosis not present

## 2020-07-04 DIAGNOSIS — M1990 Unspecified osteoarthritis, unspecified site: Secondary | ICD-10-CM | POA: Diagnosis present

## 2020-07-04 DIAGNOSIS — E876 Hypokalemia: Secondary | ICD-10-CM | POA: Diagnosis not present

## 2020-07-04 DIAGNOSIS — Z66 Do not resuscitate: Secondary | ICD-10-CM | POA: Diagnosis present

## 2020-07-04 DIAGNOSIS — N179 Acute kidney failure, unspecified: Secondary | ICD-10-CM | POA: Diagnosis present

## 2020-07-04 DIAGNOSIS — R6521 Severe sepsis with septic shock: Secondary | ICD-10-CM | POA: Diagnosis present

## 2020-07-04 DIAGNOSIS — C189 Malignant neoplasm of colon, unspecified: Secondary | ICD-10-CM | POA: Diagnosis not present

## 2020-07-04 DIAGNOSIS — Z9841 Cataract extraction status, right eye: Secondary | ICD-10-CM | POA: Diagnosis not present

## 2020-07-04 DIAGNOSIS — I10 Essential (primary) hypertension: Secondary | ICD-10-CM | POA: Diagnosis not present

## 2020-07-04 DIAGNOSIS — Z8249 Family history of ischemic heart disease and other diseases of the circulatory system: Secondary | ICD-10-CM

## 2020-07-04 DIAGNOSIS — E7849 Other hyperlipidemia: Secondary | ICD-10-CM | POA: Diagnosis present

## 2020-07-04 DIAGNOSIS — Z8619 Personal history of other infectious and parasitic diseases: Secondary | ICD-10-CM

## 2020-07-04 DIAGNOSIS — E782 Mixed hyperlipidemia: Secondary | ICD-10-CM | POA: Diagnosis present

## 2020-07-04 DIAGNOSIS — I639 Cerebral infarction, unspecified: Secondary | ICD-10-CM

## 2020-07-04 DIAGNOSIS — Z79899 Other long term (current) drug therapy: Secondary | ICD-10-CM

## 2020-07-04 DIAGNOSIS — E871 Hypo-osmolality and hyponatremia: Secondary | ICD-10-CM

## 2020-07-04 DIAGNOSIS — N390 Urinary tract infection, site not specified: Secondary | ICD-10-CM | POA: Diagnosis not present

## 2020-07-04 DIAGNOSIS — R4781 Slurred speech: Secondary | ICD-10-CM | POA: Diagnosis present

## 2020-07-04 DIAGNOSIS — Z20822 Contact with and (suspected) exposure to covid-19: Secondary | ICD-10-CM | POA: Diagnosis present

## 2020-07-04 DIAGNOSIS — E8809 Other disorders of plasma-protein metabolism, not elsewhere classified: Secondary | ICD-10-CM | POA: Diagnosis not present

## 2020-07-04 DIAGNOSIS — R29705 NIHSS score 5: Secondary | ICD-10-CM | POA: Diagnosis present

## 2020-07-04 DIAGNOSIS — A0471 Enterocolitis due to Clostridium difficile, recurrent: Secondary | ICD-10-CM | POA: Diagnosis present

## 2020-07-04 DIAGNOSIS — Z823 Family history of stroke: Secondary | ICD-10-CM

## 2020-07-04 DIAGNOSIS — D72829 Elevated white blood cell count, unspecified: Secondary | ICD-10-CM | POA: Diagnosis not present

## 2020-07-04 DIAGNOSIS — R4701 Aphasia: Secondary | ICD-10-CM | POA: Diagnosis present

## 2020-07-04 DIAGNOSIS — I6350 Cerebral infarction due to unspecified occlusion or stenosis of unspecified cerebral artery: Secondary | ICD-10-CM

## 2020-07-04 DIAGNOSIS — D500 Iron deficiency anemia secondary to blood loss (chronic): Secondary | ICD-10-CM | POA: Diagnosis not present

## 2020-07-04 DIAGNOSIS — I6389 Other cerebral infarction: Secondary | ICD-10-CM | POA: Diagnosis not present

## 2020-07-04 DIAGNOSIS — Z961 Presence of intraocular lens: Secondary | ICD-10-CM | POA: Diagnosis present

## 2020-07-04 DIAGNOSIS — N184 Chronic kidney disease, stage 4 (severe): Secondary | ICD-10-CM | POA: Diagnosis present

## 2020-07-04 DIAGNOSIS — I63312 Cerebral infarction due to thrombosis of left middle cerebral artery: Secondary | ICD-10-CM | POA: Diagnosis present

## 2020-07-04 DIAGNOSIS — Z9049 Acquired absence of other specified parts of digestive tract: Secondary | ICD-10-CM

## 2020-07-04 DIAGNOSIS — H353290 Exudative age-related macular degeneration, unspecified eye, stage unspecified: Secondary | ICD-10-CM | POA: Diagnosis present

## 2020-07-04 DIAGNOSIS — I129 Hypertensive chronic kidney disease with stage 1 through stage 4 chronic kidney disease, or unspecified chronic kidney disease: Secondary | ICD-10-CM | POA: Diagnosis present

## 2020-07-04 DIAGNOSIS — A419 Sepsis, unspecified organism: Secondary | ICD-10-CM | POA: Diagnosis not present

## 2020-07-04 LAB — COMPREHENSIVE METABOLIC PANEL
ALT: 6 U/L (ref 0–44)
AST: 8 U/L — ABNORMAL LOW (ref 15–41)
Albumin: 2.7 g/dL — ABNORMAL LOW (ref 3.5–5.0)
Alkaline Phosphatase: 70 U/L (ref 38–126)
Anion gap: 9 (ref 5–15)
BUN: 28 mg/dL — ABNORMAL HIGH (ref 8–23)
CO2: 25 mmol/L (ref 22–32)
Calcium: 7.6 mg/dL — ABNORMAL LOW (ref 8.9–10.3)
Chloride: 97 mmol/L — ABNORMAL LOW (ref 98–111)
Creatinine, Ser: 1.84 mg/dL — ABNORMAL HIGH (ref 0.44–1.00)
GFR calc Af Amer: 31 mL/min — ABNORMAL LOW (ref >60)
GFR calc non Af Amer: 27 mL/min — ABNORMAL LOW (ref >60)
Glucose, Bld: 126 mg/dL — ABNORMAL HIGH (ref 70–99)
Potassium: 3 mmol/L — ABNORMAL LOW (ref 3.5–5.1)
Sodium: 131 mmol/L — ABNORMAL LOW (ref 135–145)
Total Bilirubin: 0.6 mg/dL (ref 0.3–1.2)
Total Protein: 5.9 g/dL — ABNORMAL LOW (ref 6.5–8.1)

## 2020-07-04 LAB — URINALYSIS, ROUTINE W REFLEX MICROSCOPIC
Bilirubin Urine: NEGATIVE
Glucose, UA: NEGATIVE mg/dL
Ketones, ur: NEGATIVE mg/dL
Nitrite: POSITIVE — AB
Protein, ur: NEGATIVE mg/dL
Specific Gravity, Urine: 1.016 (ref 1.005–1.030)
WBC, UA: >50 WBC/hpf — ABNORMAL HIGH (ref 0–5)
pH: 6 (ref 5.0–8.0)

## 2020-07-04 LAB — CBC
HCT: 27.7 % — ABNORMAL LOW (ref 36.0–46.0)
Hemoglobin: 8.8 g/dL — ABNORMAL LOW (ref 12.0–15.0)
MCH: 31.3 pg (ref 26.0–34.0)
MCHC: 31.8 g/dL (ref 30.0–36.0)
MCV: 98.6 fL (ref 80.0–100.0)
Platelets: 165 10*3/uL (ref 150–400)
RBC: 2.81 MIL/uL — ABNORMAL LOW (ref 3.87–5.11)
RDW: 16.2 % — ABNORMAL HIGH (ref 11.5–15.5)
WBC: 16.2 10*3/uL — ABNORMAL HIGH (ref 4.0–10.5)
nRBC: 0 % (ref 0.0–0.2)

## 2020-07-04 LAB — DIFFERENTIAL
Abs Immature Granulocytes: 0.14 10*3/uL — ABNORMAL HIGH (ref 0.00–0.07)
Basophils Absolute: 0 10*3/uL (ref 0.0–0.1)
Basophils Relative: 0 %
Eosinophils Absolute: 0 10*3/uL (ref 0.0–0.5)
Eosinophils Relative: 0 %
Immature Granulocytes: 1 %
Lymphocytes Relative: 4 %
Lymphs Abs: 0.6 10*3/uL — ABNORMAL LOW (ref 0.7–4.0)
Monocytes Absolute: 0.7 10*3/uL (ref 0.1–1.0)
Monocytes Relative: 4 %
Neutro Abs: 14.8 10*3/uL — ABNORMAL HIGH (ref 1.7–7.7)
Neutrophils Relative %: 91 %

## 2020-07-04 LAB — SARS CORONAVIRUS 2 BY RT PCR (HOSPITAL ORDER, PERFORMED IN ~~LOC~~ HOSPITAL LAB): SARS Coronavirus 2: NEGATIVE

## 2020-07-04 LAB — LACTIC ACID, PLASMA
Lactic Acid, Venous: 0.7 mmol/L (ref 0.5–1.9)
Lactic Acid, Venous: 0.8 mmol/L (ref 0.5–1.9)

## 2020-07-04 LAB — PROTIME-INR
INR: 1.3 — ABNORMAL HIGH (ref 0.8–1.2)
Prothrombin Time: 15.2 seconds (ref 11.4–15.2)

## 2020-07-04 LAB — CBG MONITORING, ED: Glucose-Capillary: 122 mg/dL — ABNORMAL HIGH (ref 70–99)

## 2020-07-04 LAB — APTT: aPTT: 31 seconds (ref 24–36)

## 2020-07-04 MED ORDER — LACTATED RINGERS IV BOLUS (SEPSIS): 1000.0000 mL | Freq: Once | INTRAVENOUS | Status: DC

## 2020-07-04 MED ORDER — LACTATED RINGERS IV BOLUS (SEPSIS)
1000.0000 mL | Freq: Once | INTRAVENOUS | Status: AC
  Administered 2020-07-04: 1000 mL via INTRAVENOUS

## 2020-07-04 MED ORDER — POTASSIUM CHLORIDE CRYS ER 20 MEQ PO TBCR
40.0000 meq | EXTENDED_RELEASE_TABLET | Freq: Once | ORAL | Status: AC
  Administered 2020-07-04: 40 meq via ORAL
  Filled 2020-07-04: qty 2

## 2020-07-04 MED ORDER — ACETAMINOPHEN 325 MG PO TABS
650.0000 mg | ORAL_TABLET | Freq: Once | ORAL | Status: AC
  Administered 2020-07-04: 650 mg via ORAL
  Filled 2020-07-04: qty 2

## 2020-07-04 MED ORDER — ONDANSETRON HCL 4 MG PO TABS: 4.0000 mg | ORAL_TABLET | Freq: Four times a day (QID) | ORAL | Status: DC | PRN

## 2020-07-04 MED ORDER — ONDANSETRON HCL 4 MG/2ML IJ SOLN
4.0000 mg | Freq: Four times a day (QID) | INTRAMUSCULAR | Status: DC | PRN
  Administered 2020-07-05: 4 mg via INTRAVENOUS
  Filled 2020-07-04: qty 2

## 2020-07-04 MED ORDER — SODIUM CHLORIDE 0.9 % IV SOLN: 1.0000 g | INTRAVENOUS | Status: DC

## 2020-07-04 MED ORDER — LACTATED RINGERS IV BOLUS: 1000.0000 mL | Freq: Once | INTRAVENOUS | Status: DC

## 2020-07-04 MED ORDER — ENOXAPARIN SODIUM 30 MG/0.3ML ~~LOC~~ SOLN
30.0000 mg | SUBCUTANEOUS | Status: DC
  Administered 2020-07-05: 30 mg via SUBCUTANEOUS
  Filled 2020-07-04: qty 0.3

## 2020-07-04 MED ORDER — SODIUM CHLORIDE 0.9 % IV BOLUS
1000.0000 mL | Freq: Once | INTRAVENOUS | Status: AC
  Administered 2020-07-04: 1000 mL via INTRAVENOUS

## 2020-07-04 MED ORDER — SODIUM CHLORIDE 0.9% FLUSH: 3.0000 mL | Freq: Once | INTRAVENOUS | Status: DC

## 2020-07-04 MED ORDER — IOHEXOL 350 MG/ML SOLN
100.0000 mL | Freq: Once | INTRAVENOUS | Status: AC | PRN
  Administered 2020-07-04: 60 mL via INTRAVENOUS

## 2020-07-04 MED ORDER — SODIUM CHLORIDE 0.9 % IV SOLN
1.0000 g | INTRAVENOUS | Status: DC
  Administered 2020-07-04: 1 g via INTRAVENOUS
  Filled 2020-07-04: qty 10

## 2020-07-04 NOTE — Progress Notes (Signed)
2008 call time 2014 exam started 2015 exam finished 2015 images sent to [facility] 2017 exam completed in Epic 2014 [facility] radiology called

## 2020-07-04 NOTE — ED Provider Notes (Signed)
[facility] EMERGENCY DEPARTMENT Provider Note   CSN: [id number] Arrival date & time: 07/04/20  2011  An emergency department physician performed an initial assessment on this suspected stroke patient at 2013.  History Chief Complaint  Patient presents with  . Code Stroke    Gabrielle [name] is a 74 y.o. female.  HPI 74 year old female presents as a code stroke. Last known normal apparently 3:30 PM (EMS states 4 hours ago by neighbor, this was when they arrived at 15). Patient's history is limited by trouble speaking. History is further obtained from the daughter on the phone after initial work-up which indicates that she has been sick since her most recent chemo treatment including a lot of diarrhea and weakness. When neighbor called this afternoon her speech was not sounding right and so 911 was called. No fevers.   Past Medical History:  Diagnosis Date  . Arthritis   . Cancer ([place])   . Cataract    OU  . Colon cancer ([place])   . H/O cesarean section   . Hx of tonsillectomy   . Hyperlipidemia   . Hypertension   . Macular degeneration    Exu ARMD OU    Patient Active Problem List   Diagnosis Date Noted  . Sepsis ([place]) 07/04/2020  . Hospital discharge follow-up 06/15/2020  . Enterocolitis due to Clostridioides difficile 06/02/2020  . DNR (do not resuscitate) 06/02/2020  . Diarrhea associated with pseudomembranous colitis 06/02/2020  . Nausea vomiting and diarrhea   . Acute kidney injury ([place]) 05/28/2020  . Acute on chronic renal failure ([place]) 05/28/2020  . Iron deficiency anemia due to chronic blood loss 05/19/2020  . Urinary retention 04/15/2020  . Bladder wall thickening   . Malignant neoplasm of colon ([place])   . Gastrointestinal hemorrhage   . Hypokalemia   . Macular degeneration   . Symptomatic anemia 04/04/2020  . Anemia 08/28/2019  . High risk for hip fracture 06/18/2019  . Osteopenia after menopause 06/18/2019  . Stage 3 chronic kidney disease 06/18/2019  .  Essential hypertension 05/15/2019  . Other hyperlipidemia 05/15/2019    Past Surgical History:  Procedure Laterality Date  . BIOPSY  04/06/2020   Procedure: BIOPSY;  Surgeon: Rogene Houston, MD;  Location: [facility] ENDO SUITE;  Service: Endoscopy;;  . CARPAL TUNNEL RELEASE Right   . CATARACT EXTRACTION W/PHACO Left 10/11/2019   Procedure: CATARACT EXTRACTION PHACO AND INTRAOCULAR LENS PLACEMENT (IOC);  Surgeon: Baruch Goldmann, MD;  Location: [facility] ORS;  Service: Ophthalmology;  Laterality: Left;  CDE: 8.56  . CATARACT EXTRACTION W/PHACO Right 10/25/2019   Procedure: CATARACT EXTRACTION PHACO AND INTRAOCULAR LENS PLACEMENT (IOC);  Surgeon: Baruch Goldmann, MD;  Location: [facility] ORS;  Service: Ophthalmology;  Laterality: Right;  CDE: 5.67  . CESAREAN SECTION    . COLONOSCOPY N/A 04/06/2020   Procedure: COLONOSCOPY;  Surgeon: Rogene Houston, MD;  Location: [facility] ENDO SUITE;  Service: Endoscopy;  Laterality: N/A;  . CYSTOSCOPY WITH BIOPSY N/A 04/08/2020   Procedure: CYSTOSCOPY WITH BIOPSY;  Surgeon: Cleon Gustin, MD;  Location: [facility] ORS;  Service: Urology;  Laterality: N/A;  . ESOPHAGOGASTRODUODENOSCOPY N/A 04/05/2020   Procedure: ESOPHAGOGASTRODUODENOSCOPY (EGD);  Surgeon: Rogene Houston, MD;  Location: [facility] END[facility] SU[facility];  Service: Endoscopy;  Laterality: N/A;  . PARTIAL COLECTOMY N/A 04/08/2020   Procedure: PARTIAL COLECTOMY;  Surgeon: Virl Cagey, MD;  Location: [facility] ORS;  Service: General;  Laterality: N/A;  . PORTACATH PLACEMENT Left 05/18/2020   Procedure: INSERTION PORT-A-CATH (ATTACHED CATHETER IN LEFT  SUBCLAVIAN);  Surgeon: Virl Cagey, MD;  Location: [facility] ORS;  Service: General;  Laterality: Left;  . TONSILLECTOMY       OB History   No obstetric history on file.     Family History  Problem Relation Age of Onset  . Macular degeneration Mother   . Stroke Mother   . Hypertension Mother   . Dementia Mother   . Cancer Father        lung  . Diabetes Sister   . Hypertension Sister      Social History   Tobacco Use  . Smoking status: Never Smoker  . Smokeless tobacco: Never Used  Vaping Use  . Vaping Use: Never used  Substance Use Topics  . Alcohol use: No  . Drug use: No    Home Medications Prior to Admission medications   Medication Sig Start Date End Date Taking? Authorizing Provider  alendronate (FOSAMAX) 70 MG tablet Take 1 tablet (70 mg total) by mouth every 7 (seven) days. Take with a full glass of water on an empty stomach. 09/09/19  Yes Gottschalk, Leatrice Jewels M, DO  atorvastatin (LIPITOR) 20 MG tablet Take 1 tablet (20 mg total) by mouth daily. 03/20/20  Yes Ronnie Doss M, DO  cholecalciferol (VITAMIN D3) 25 MCG (1000 UT) tablet Take 1,000 Units by mouth daily.   Yes [provider]  CRANBERRY FRUIT PO Take 1 tablet by mouth daily.   Yes [provider]  diphenoxylate-atropine (LOMOTIL) 2.5-0.025 MG tablet Take 1 tablet by mouth 4 (four) times daily as needed for diarrhea or loose stools.   Yes [provider]  ferrous sulfate 325 (65 FE) MG EC tablet Take 1 tablet (325 mg total) by mouth 2 (two) times daily with a meal. 06/25/19  Yes Gottschalk, Ashly M, DO  potassium chloride SA (KLOR-CON) 20 MEQ tablet Take 1 tablet (20 mEq total) by mouth daily. 06/13/20  Yes Manuella Ghazi, Pratik D, DO  prochlorperazine (COMPAZINE) 10 MG tablet Take 1 tablet (10 mg total) by mouth every 6 (six) hours as needed for nausea or vomiting. 06/22/20  Yes Derek Jack, MD  saccharomyces boulardii (FLORASTOR) 250 MG capsule Take 1 capsule (250 mg total) by mouth 2 (two) times daily. 06/05/20 07/05/20 Yes Shah, Pratik D, DO  amLODipine (NORVASC) 5 MG tablet Take 1 tablet (5 mg total) by mouth daily. 05/30/20 05/30/21  Barton Dubois, MD  diclofenac Sodium (VOLTAREN) 1 % GEL Apply 2 g topically 4 (four) times daily.  Patient not taking: Reported on 06/30/2020    [provider]  dorzolamide-timolol (COSOPT) 22.3-6.8 MG/ML ophthalmic solution PLACE 1 DROP  INTO BOTH EYES 2 TIMES A DAY 07/01/20   Bernarda Caffey, MD  fluorouracil CALGB 70263 in sodium chloride 0.9 % 150 mL Inject into the vein over 48 hr. 05/19/20   [provider]  FLUOROURACIL IV Inject into the vein every 14 (fourteen) days. 05/19/20   [provider]  LEUCOVORIN CALCIUM IV Inject into the vein every 14 (fourteen) days. 05/19/20   [provider]  lidocaine-prilocaine (EMLA) cream Apply a small amount to port a cath site and cover with plastic wrap 1 hour prior to chemotherapy appointments Patient not taking: Reported on 06/22/2020 05/14/20   Derek Jack, MD  losartan-hydrochlorothiazide (HYZAAR) 100-25 MG tablet Take 1 tablet by mouth daily. 06/24/20   [provider]  OXALIPLATIN IV Inject into the vein every 14 (fourteen) days. 05/19/20   [provider]    Allergies    Lisinopril  Review of Systems   Review of Systems  Unable to perform ROS: Mental status change    Physical Exam Updated Vital Signs BP (!) 123/47   Pulse 94   Temp 99.3 F (37.4 C) (Rectal)   Resp 14   SpO2 100%   Physical Exam Vitals and nursing note reviewed.  Constitutional:      General: She is not in acute distress.    Appearance: She is well-developed. She is not diaphoretic.  HENT:     Head: Normocephalic and atraumatic.     Right Ear: External ear normal.     Left Ear: External ear normal.     Nose: Nose normal.  Eyes:     General:        Right eye: No discharge.        Left eye: No discharge.  Cardiovascular:     Rate and Rhythm: Normal rate and regular rhythm.     Heart sounds: Normal heart sounds.  Pulmonary:     Effort: Pulmonary effort is normal.     Breath sounds: Normal breath sounds.  Abdominal:     General: There is no distension.     Palpations: Abdomen is soft.     Tenderness: There is no abdominal tenderness.  Skin:    General: Skin is warm and dry.  Neurological:     Mental Status: She is alert.     Comments: CN  3-12 grossly intact. Speech is quiet and seems slurred. No facial droop.  5/5 strength in all 4 extremities. Grossly normal sensation.  Psychiatric:        Mood and Affect: Mood is not anxious.     ED Results / Procedures / Treatments   Labs (all labs ordered are listed, but only abnormal results are displayed) Labs Reviewed  PROTIME-INR - Abnormal; Notable for the following components:      Result Value   INR 1.3 (*)    All other components within normal limits  CBC - Abnormal; Notable for the following components:   WBC 16.2 (*)    RBC 2.81 (*)    Hemoglobin 8.8 (*)    HCT 27.7 (*)    RDW 16.2 (*)    All other components within normal limits  DIFFERENTIAL - Abnormal; Notable for the following components:   Neutro Abs 14.8 (*)    Lymphs Abs 0.6 (*)    Abs Immature Granulocytes 0.14 (*)    All other components within normal limits  COMPREHENSIVE METABOLIC PANEL - Abnormal; Notable for the following components:   Sodium 131 (*)    Potassium 3.0 (*)    Chloride 97 (*)    Glucose, Bld 126 (*)    BUN 28 (*)    Creatinine, Ser 1.84 (*)    Calcium 7.6 (*)    Total Protein 5.9 (*)    Albumin 2.7 (*)    AST 8 (*)    GFR calc non Af Amer 27 (*)    GFR calc Af Amer 31 (*)    All other components within normal limits  URINALYSIS, ROUTINE W REFLEX MICROSCOPIC - Abnormal; Notable for the following components:   APPearance CLOUDY (*)    Hgb urine dipstick SMALL (*)    Nitrite POSITIVE (*)    Leukocytes,Ua LARGE (*)    WBC, UA >50 (*)    Bacteria, UA FEW (*)    All other components within normal limits  CBG MONITORING, ED - Abnormal; Notable for the following components:  Glucose-Capillary 122 (*)    All other components within normal limits  CULTURE, BLOOD (ROUTINE X 2)  CULTURE, BLOOD (ROUTINE X 2)  SARS CORONAVIRUS 2 BY RT PCR (HOSPITAL ORDER, [place] [facility])  URINE CULTURE  APTT  LACTIC ACID, PLASMA  LACTIC ACID, PLASMA  PROTIME-INR   CORTISOL-AM, BLOOD  PROCALCITONIN  CBC  COMPREHENSIVE METABOLIC PANEL  CBC    EKG None  Radiology CT CEREBRAL PERFUSION W CONTRAST  Result Date: 07/04/2020 CLINICAL DATA:  Aphasia.  Code stroke. EXAM: CT ANGIOGRAPHY HEAD AND NECK CT PERFUSION BRAIN TECHNIQUE: Multidetector CT imaging of the head and neck was performed using the standard protocol during bolus administration of intravenous contrast. Multiplanar CT image reconstructions and MIPs were obtained to evaluate the vascular anatomy. Carotid stenosis measurements (when applicable) are obtained utilizing NASCET criteria, using the distal internal carotid diameter as the denominator. Multiphase CT imaging of the brain was performed following IV bolus contrast injection. Subsequent parametric perfusion maps were calculated using RAPID software. CONTRAST:  75mL OMNIPAQUE IOHEXOL 350 MG/ML SOLN COMPARISON:  Noncontrast head CT performed earlier the same day 07/04/2020. FINDINGS: CTA NECK FINDINGS Aortic arch: Standard aortic branching. Atherosclerotic plaque within the visualized aortic arch and proximal major branch vessels of the neck. No hemodynamically significant innominate or proximal subclavian artery stenosis. Right carotid system: CCA and ICA patent within the neck without significant stenosis (50% or greater). Mild mixed plaque within the carotid bifurcation Left carotid system: CCA and ICA patent within the neck without significant stenosis (50% or greater). Minimal mixed plaque within the CCA and carotid bifurcation. Vertebral arteries: Codominant patent within the neck bilaterally. Moderate atherosclerotic narrowing at the origin of the left vertebral artery. Skeleton: No acute bony abnormality Other neck: No neck mass or cervical lymphadenopathy. Subcentimeter right thyroid lobe nodule not meeting consensus criteria for ultrasound follow-up. Upper chest: No consolidation within the imaged lung apices Review of the MIP images confirms  the above findings CTA HEAD FINDINGS Anterior circulation: The intracranial internal carotid arteries are patent. Calcified plaque within both vessels. Moderate stenosis of the supraclinoid left ICA. Otherwise, no more than mild narrowing of these vessels. The M1 middle cerebral arteries are patent. There is a high-grade focal stenosis within the mid M1 left MCA. Atherosclerotic irregularity of the M2 and more distal MCA branch vessels bilaterally. Most notably, there is a high-grade focal stenosis within a proximal M2 left MCA branch vessel (series 9, image 28). Additionally, there is a high-grade stenosis within a mid M2 MCA branch vessel on the right (series 9, image 15). No proximal branch occlusion is identified. The anterior cerebral arteries are patent. There is atherosclerotic irregularity of both anterior cerebral arteries. Most notably, there is a high-grade focal stenosis within the proximal A2 right anterior cerebral artery (series 4, image 92). Additionally, there is a high-grade focal stenosis within the left anterior cerebral artery at the A2/A3 junction. No intracranial aneurysm is identified. Posterior circulation: The intracranial vertebral arteries are patent. There is a moderate/severe focal stenosis within the V4 right vertebral artery at the level of the skull base. The basilar artery is patent without significant stenosis. Fetal origin left posterior cerebral artery. There is a moderate/severe focal stenosis within the P2 left PCA (series 9, image 24). High-grade focal stenosis within the left PCA at the P3/P4 junction. Sites of moderate stenosis within the P2 right PCA. Additionally, there are sites of severe stenosis within the right PCA at the P3/P4 junction. The right posterior communicating  artery is hypoplastic or absent Venous sinuses: Within limitations of contrast timing, no convincing thrombus. Anatomic variants: As described Review of the MIP images confirms the above findings CT  Brain Perfusion Findings: ASPECTS: 10 CBF (<30%) Volume: 68mL Perfusion (Tmax>6.0s) volume: 60mL. However, there is a 5 mL region within the left frontal lobe, within the left MCA vascular territory, demonstrating a Tmax greater than 4 seconds. Mismatch Volume: 65mL Infarction Location:None identified These results were called by telephone at the time of interpretation on 07/04/2020 at 9:24 pm to provider Dr. Regenia Skeeter, who verbally acknowledged these results. IMPRESSION: CTA neck: 1. The bilateral common and internal carotid arteries are patent within the neck without hemodynamically significant stenosis. 2. The vertebral arteries are patent within the neck bilaterally. Moderate atherosclerotic narrowing at the origin of the left vertebral artery. CTA head: 1. No intracranial large vessel occlusion. 2. Intracranial atherosclerotic disease with multifocal stenoses, most notably as follows. 3. High-grade focal stenosis within the M1 left middle cerebral artery. 4. High-grade focal stenosis within a proximal M2 left MCA branch vessel. 5. High-grade focal stenosis within a mid M2 right MCA branch vessel. 6. High-grade focal stenosis within the proximal A2 right ACA. 7. High-grade focal stenosis within the left ACA at the A2/A3 junction. 8. Moderate stenosis of the supraclinoid left ICA. 9. Moderate/severe focal stenosis within the right vertebral artery at the level of the skull base. 10. Moderate/severe focal stenosis within the P2 left PCA. 11. High-grade focal stenosis within the left PCA at the P3/P4 junction. 12. Sites of moderate stenosis within the P2 right PCA. 13. High-grade stenoses within the right PCA at the P3/P4 junction. CT perfusion head: The perfusion software identifies no core infarct. The perfusion software identifies no critically hypoperfused parenchyma utilizing the Tmax >6 seconds threshold. However, there is a 5 mL region within the left frontal lobe demonstrating a Tmax >4 seconds within the left  MCA vascular territory. Electronically Signed   By: Kellie Simmering DO   On: 07/04/2020 21:24   DG Chest Portable 1 View  Result Date: 07/04/2020 CLINICAL DATA:  Altered mental status EXAM: PORTABLE CHEST 1 VIEW COMPARISON:  05/18/2020 FINDINGS: Cardiac shadow is stable. Aortic calcifications are seen. Left chest wall port is noted and stable. The lungs are well aerated without focal infiltrate or sizable effusion. Chronic interstitial changes are again seen. No new focal abnormality is noted. IMPRESSION: No acute abnormality noted. Electronically Signed   By: Inez Catalina M.D.   On: 07/04/2020 21:14   CT HEAD CODE STROKE WO CONTRAST  Result Date: 07/04/2020 CLINICAL DATA:  Neuro deficit, acute, stroke suspected. Additional provided: Aphasia, last known well 3:30 p.m., neighbor stated when they went to check on patient she could not speak. EXAM: CT HEAD WITHOUT CONTRAST TECHNIQUE: Contiguous axial images were obtained from the base of the skull through the vertex without intravenous contrast. COMPARISON:  No pertinent prior exams are available for comparison. FINDINGS: Brain: Cerebral volume is normal for age. Minimal ill-defined hypoattenuation within the cerebral white matter is nonspecific, but consistent with chronic small vessel ischemic disease. There is no acute intracranial hemorrhage. No demarcated cortical infarct is identified. No extra-axial fluid collection. No evidence of intracranial mass. No midline shift. Vascular: No hyperdense vessel Skull: Normal. Negative for fracture or focal lesion. Sinuses/Orbits: Visualized orbits show no acute finding. Mild ethmoid sinus mucosal thickening. No significant mastoid effusion at the imaged levels ASPECTS [facility] Stroke Program Early CT Score) - Ganglionic level infarction (caudate, lentiform nuclei, internal capsule,  insula, M1-M3 cortex): 7 - Supraganglionic infarction (M4-M6 cortex): 3 Total score (0-10 with 10 being normal): 10 These results were  called by telephone at the time of interpretation on 07/04/2020 at 8:24 pm to provider Sherwood Gambler , who verbally acknowledged these results. IMPRESSION: 1. No CT evidence of acute intracranial abnormality.  ASPECTS is 10. 2. Mild chronic small vessel ischemic changes within the cerebral white matter. 3. Mild ethmoid sinus mucosal thickening. Electronically Signed   By: Kellie Simmering DO   On: 07/04/2020 20:24   CT ANGIO NECK CODE STROKE  Result Date: 07/04/2020 CLINICAL DATA:  Aphasia.  Code stroke. EXAM: CT ANGIOGRAPHY HEAD AND NECK CT PERFUSION BRAIN TECHNIQUE: Multidetector CT imaging of the head and neck was performed using the standard protocol during bolus administration of intravenous contrast. Multiplanar CT image reconstructions and MIPs were obtained to evaluate the vascular anatomy. Carotid stenosis measurements (when applicable) are obtained utilizing NASCET criteria, using the distal internal carotid diameter as the denominator. Multiphase CT imaging of the brain was performed following IV bolus contrast injection. Subsequent parametric perfusion maps were calculated using RAPID software. CONTRAST:  60mL OMNIPAQUE IOHEXOL 350 MG/ML SOLN COMPARISON:  Noncontrast head CT performed earlier the same day 07/04/2020. FINDINGS: CTA NECK FINDINGS Aortic arch: Standard aortic branching. Atherosclerotic plaque within the visualized aortic arch and proximal major branch vessels of the neck. No hemodynamically significant innominate or proximal subclavian artery stenosis. Right carotid system: CCA and ICA patent within the neck without significant stenosis (50% or greater). Mild mixed plaque within the carotid bifurcation Left carotid system: CCA and ICA patent within the neck without significant stenosis (50% or greater). Minimal mixed plaque within the CCA and carotid bifurcation. Vertebral arteries: Codominant patent within the neck bilaterally. Moderate atherosclerotic narrowing at the origin of the left  vertebral artery. Skeleton: No acute bony abnormality Other neck: No neck mass or cervical lymphadenopathy. Subcentimeter right thyroid lobe nodule not meeting consensus criteria for ultrasound follow-up. Upper chest: No consolidation within the imaged lung apices Review of the MIP images confirms the above findings CTA HEAD FINDINGS Anterior circulation: The intracranial internal carotid arteries are patent. Calcified plaque within both vessels. Moderate stenosis of the supraclinoid left ICA. Otherwise, no more than mild narrowing of these vessels. The M1 middle cerebral arteries are patent. There is a high-grade focal stenosis within the mid M1 left MCA. Atherosclerotic irregularity of the M2 and more distal MCA branch vessels bilaterally. Most notably, there is a high-grade focal stenosis within a proximal M2 left MCA branch vessel (series 9, image 28). Additionally, there is a high-grade stenosis within a mid M2 MCA branch vessel on the right (series 9, image 15). No proximal branch occlusion is identified. The anterior cerebral arteries are patent. There is atherosclerotic irregularity of both anterior cerebral arteries. Most notably, there is a high-grade focal stenosis within the proximal A2 right anterior cerebral artery (series 4, image 92). Additionally, there is a high-grade focal stenosis within the left anterior cerebral artery at the A2/A3 junction. No intracranial aneurysm is identified. Posterior circulation: The intracranial vertebral arteries are patent. There is a moderate/severe focal stenosis within the V4 right vertebral artery at the level of the skull base. The basilar artery is patent without significant stenosis. Fetal origin left posterior cerebral artery. There is a moderate/severe focal stenosis within the P2 left PCA (series 9, image 24). High-grade focal stenosis within the left PCA at the P3/P4 junction. Sites of moderate stenosis within the P2 right PCA. Additionally, there  are sites  of severe stenosis within the right PCA at the P3/P4 junction. The right posterior communicating artery is hypoplastic or absent Venous sinuses: Within limitations of contrast timing, no convincing thrombus. Anatomic variants: As described Review of the MIP images confirms the above findings CT Brain Perfusion Findings: ASPECTS: 10 CBF (<30%) Volume: 3mL Perfusion (Tmax>6.0s) volume: 36mL. However, there is a 5 mL region within the left frontal lobe, within the left MCA vascular territory, demonstrating a Tmax greater than 4 seconds. Mismatch Volume: 64mL Infarction Location:None identified These results were called by telephone at the time of interpretation on 07/04/2020 at 9:24 pm to provider Dr. Regenia Skeeter, who verbally acknowledged these results. IMPRESSION: CTA neck: 1. The bilateral common and internal carotid arteries are patent within the neck without hemodynamically significant stenosis. 2. The vertebral arteries are patent within the neck bilaterally. Moderate atherosclerotic narrowing at the origin of the left vertebral artery. CTA head: 1. No intracranial large vessel occlusion. 2. Intracranial atherosclerotic disease with multifocal stenoses, most notably as follows. 3. High-grade focal stenosis within the M1 left middle cerebral artery. 4. High-grade focal stenosis within a proximal M2 left MCA branch vessel. 5. High-grade focal stenosis within a mid M2 right MCA branch vessel. 6. High-grade focal stenosis within the proximal A2 right ACA. 7. High-grade focal stenosis within the left ACA at the A2/A3 junction. 8. Moderate stenosis of the supraclinoid left ICA. 9. Moderate/severe focal stenosis within the right vertebral artery at the level of the skull base. 10. Moderate/severe focal stenosis within the P2 left PCA. 11. High-grade focal stenosis within the left PCA at the P3/P4 junction. 12. Sites of moderate stenosis within the P2 right PCA. 13. High-grade stenoses within the right PCA at the P3/P4  junction. CT perfusion head: The perfusion software identifies no core infarct. The perfusion software identifies no critically hypoperfused parenchyma utilizing the Tmax >6 seconds threshold. However, there is a 5 mL region within the left frontal lobe demonstrating a Tmax >4 seconds within the left MCA vascular territory. Electronically Signed   By: Kellie Simmering DO   On: 07/04/2020 21:24    Procedures .Critical Care Performed by: Sherwood Gambler, MD Authorized by: Sherwood Gambler, MD   Critical care provider statement:    Critical care time (minutes):  45   Critical care time was exclusive of:  Separately billable procedures and treating other patients   Critical care was necessary to treat or prevent imminent or life-threatening deterioration of the following conditions:  CNS failure or compromise and sepsis   Critical care was time spent personally by me on the following activities:  Discussions with consultants, evaluation of patient's response to treatment, examination of patient, ordering and performing treatments and interventions, ordering and review of laboratory studies, ordering and review of radiographic studies, pulse oximetry, re-evaluation of patient's condition, obtaining history from patient or surrogate and review of old charts   (including critical care time)  Medications Ordered in ED Medications  sodium chloride flush (NS) 0.9 % injection 3 mL (3 mLs Intravenous Not Given 07/04/20 2041)  cefTRIAXone (ROCEPHIN) 1 g in sodium chloride 0.9 % 100 mL IVPB (0 g Intravenous Stopped 07/04/20 2254)  enoxaparin (LOVENOX) injection 40 mg (has no administration in time range)  lactated ringers bolus 1,000 mL (has no administration in time range)    And  lactated ringers bolus 1,000 mL (has no administration in time range)  ondansetron (ZOFRAN) tablet 4 mg (has no administration in time range)    Or  ondansetron (ZOFRAN) injection 4 mg (has no administration in time range)  iohexol  (OMNIPAQUE) 350 MG/ML injection 100 mL (60 mLs Intravenous Contrast Given 07/04/20 2041)  sodium chloride 0.9 % bolus 1,000 mL (0 mLs Intravenous Stopped 07/04/20 2254)  lactated ringers bolus 1,000 mL (0 mLs Intravenous Stopped 07/04/20 2254)    And  lactated ringers bolus 1,000 mL (1,000 mLs Intravenous New Bag/Given 07/04/20 2254)  acetaminophen (TYLENOL) tablet 650 mg (650 mg Oral Given 07/04/20 2210)  potassium chloride SA (KLOR-CON) CR tablet 40 mEq (40 mEq Oral Given 07/04/20 2210)    ED Course  I have reviewed the triage vital signs and the nursing notes.  Pertinent labs & imaging results that were available during my care of the patient were reviewed by me and considered in my medical decision making (see chart for details).    MDM Rules/Calculators/A&P                          Patient presents as code stroke though shortly after arrival it seems that she is out of the TPA window. Further investigation shows her vital signs are abnormal with initial hypotension and to straight blood pressures under 90. Also low-grade fever. Sepsis work-up started and given IV Rocephin. Found to have acute urinary tract infection. Teleneurology recommended CT angiography and perfusion which shows significant stenosis of multiple arteries as well as a small area of acute stroke/hypoperfusion. This is probably from hypotension/sepsis rather than acute embolism/clot. Hospitalist will admit and he requests consult to [facility] neurology as she will likely need to go to [facility]. Discussed with Dr. Rory Percy, does not need emergent transfer but will need to be seen at [facility] with MRI and stroke work-up. 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

## 2020-07-04 NOTE — ED Triage Notes (Signed)
Pt arrives from home via [facility] for CODE stroke. Ems reports aphasia. Denies any weakness in extremities.

## 2020-07-04 NOTE — ED Notes (Signed)
CONTACT INFO:  Gabrielle [name] (daughter): [phone]  Please call daughter with updates.

## 2020-07-04 NOTE — H&P (Signed)
History and Physical  Gabrielle [name]:[id number] DOB: November 22, 1946 DOA: 07/04/2020  Referring physician: Sherwood Gambler, MD PCP: Janora Norlander, DO  Patient coming from: Home  Chief Complaint: Slurred speech  HPI: Gabrielle [name] is a 74 y.o. female with medical history significant for stage IIIB poorly differentiated right colon adenocarcinoma recently started on chemotherapy, hypertension, hyperlipidemia.  She is s/p right hemicolectomy and she presents to the emergency department  today via EMS due to difficulty in being able to speak.  Patient was unable to provide history due to trouble speaking, history was obtained from ED physician and ED chart.  Per report, patient was last seen normal at baseline around 3:30 PM by neighbor and she did not arrive at the ED until 7:30 PM.  Patient was reported to have been sick with her most recent chemo treatment resulting in diarrhea and weakness (she was admitted on 6/29 and discharged on 7/2 due to severe C. difficile enterocolitis, ileus/partial SBO which resolved prior to discharge).  There was no report of fever, chills, nausea, vomiting or abdominal pain.  ED Course:  In the emergency department, she was noted to be febrile with a temperature of 100.40F, BP 87/45 (MAP 59) and tachycardic.  Work-up in the ED showed leukocytosis, normocytic anemia, hyponatremia, hypokalemia, BUN to creatinine 28/1.84 (baseline creatinine at 1.0-1.2), hypoalbuminemia, SARS coronavirus 2 was negative.  Urinalysis was positive for UTI.  CT of head without contrast showed no acute intracranial abnormality.  CT angiography of neck showed patent bilateral common and internal carotid arteries, patent vertebral arteries within the neck bilaterally were also noted.  CT angiography of the head showed no intracranial large vessel occlusion but shows several high-grade focal stenosis within left and right MCA branch and right and left ACA.  High-grade focal stenosis was also  noted within the left PCA. Teleneurologist was consulted and recommended further stroke work-up. Neurologist at [facility] (Dr. Malen Gauze) was also consulted and recommend transfer of patient to [facility] with plan to see patient when patient arrives at the campus.   She was empirically started on IV ceftriaxone, IV hydration per sepsis protocol was provided.   Review of Systems: This cannot be obtained at this time due to patient's inability to express self due to slurred speech.  Past Medical History:  Diagnosis Date  . Arthritis   . Cancer ([place])   . Cataract    OU  . Colon cancer ([place])   . H/O cesarean section   . Hx of tonsillectomy   . Hyperlipidemia   . Hypertension   . Macular degeneration    Exu ARMD OU   Past Surgical History:  Procedure Laterality Date  . BIOPSY  04/06/2020   Procedure: BIOPSY;  Surgeon: Rogene Houston, MD;  Location: [facility];  Service: Endoscopy;;  . CARPAL TUNNEL RELEASE Right   . CATARACT EXTRACTION W/PHACO Left 10/11/2019   Procedure: CATARACT EXTRACTION PHACO AND INTRAOCULAR LENS PLACEMENT (IOC);  Surgeon: Baruch Goldmann, MD;  Location: [facility] ORS;  Service: Ophthalmology;  Laterality: Left;  CDE: 8.56  . CATARACT EXTRACTION W/PHACO Right 10/25/2019   Procedure: CATARACT EXTRACTION PHACO AND INTRAOCULAR LENS PLACEMENT (IOC);  Surgeon: Baruch Goldmann, MD;  Location: [facility] ORS;  Service: Ophthalmology;  Laterality: Right;  CDE: 5.67  . CESAREAN SECTION    . COLONOSCOPY N/A 04/06/2020   Procedure: COLONOSCOPY;  Surgeon: Rogene Houston, MD;  Location: [facility];  Service: Endoscopy;  Laterality: N/A;  . CYSTOSCOPY WITH BIOPSY N/A 04/08/2020  Procedure: CYSTOSCOPY WITH BIOPSY;  Surgeon: Cleon Gustin, MD;  Location: [facility] ORS;  Service: Urology;  Laterality: N/A;  . ESOPHAGOGASTRODUODENOSCOPY N/A 04/05/2020   Procedure: ESOPHAGOGASTRODUODENOSCOPY (EGD);  Surgeon: Rogene Houston, MD;  [facility];  Service: Endoscopy;  Laterality: N/A;  . PARTIAL  COLECTOMY N/A 04/08/2020   Procedure: PARTIAL COLECTOMY;  Surgeon: Virl Cagey, MD;  Location: [facility] ORS;  Service: General;  Laterality: N/A;  . PORTACATH PLACEMENT Left 05/18/2020   Procedure: INSERTION PORT-A-CATH (ATTACHED CATHETER IN LEFT SUBCLAVIAN);  Surgeon: Virl Cagey, MD;  Location: [facility] OR[facility];  Service: General;  Laterality: Left;  . TONSILLECTOMY      Social History:  reports that she has never smoked. She has never used smokeless tobacco. She reports that she does not drink alcohol and does not use drugs.   Allergies  Allergen Reactions  . Lisinopril Cough    Family History  Problem Relation Age of Onset  . Macular degeneration Mother   . Stroke Mother   . Hypertension Mother   . Dementia Mother   . Cancer Father        lung  . Diabetes Sister   . Hypertension Sister     Prior to Admission medications   Medication Sig Start Date End Date Taking? Authorizing Provider  alendronate (FOSAMAX) 70 MG tablet Take 1 tablet (70 mg total) by mouth every 7 (seven) days. Take with a full glass of water on an empty stomach. 09/09/19  Yes Gottschalk, Leatrice Jewels M, DO  atorvastatin (LIPITOR) 20 MG tablet Take 1 tablet (20 mg total) by mouth daily. 03/20/20  Yes Ronnie Doss M, DO  cholecalciferol (VITAMIN D3) 25 MCG (1000 UT) tablet Take 1,000 Units by mouth daily.   Yes [provider]  CRANBERRY FRUIT PO Take 1 tablet by mouth daily.   Yes [provider]  diphenoxylate-atropine (LOMOTIL) 2.5-0.025 MG tablet Take 1 tablet by mouth 4 (four) times daily as needed for diarrhea or loose stools.   Yes [provider]  ferrous sulfate 325 (65 FE) MG EC tablet Take 1 tablet (325 mg total) by mouth 2 (two) times daily with a meal. 06/25/19  Yes Gottschalk, Ashly M, DO  potassium chloride SA (KLOR-CON) 20 MEQ tablet Take 1 tablet (20 mEq total) by mouth daily. 06/13/20  Yes Manuella Ghazi, Pratik D, DO  prochlorperazine (COMPAZINE) 10 MG tablet Take 1 tablet (10 mg total)  by mouth every 6 (six) hours as needed for nausea or vomiting. 06/22/20  Yes Derek Jack, MD  saccharomyces boulardii (FLORASTOR) 250 MG capsule Take 1 capsule (250 mg total) by mouth 2 (two) times daily. 06/05/20 07/05/20 Yes Shah, Pratik D, DO  amLODipine (NORVASC) 5 MG tablet Take 1 tablet (5 mg total) by mouth daily. 05/30/20 05/30/21  Barton Dubois, MD  diclofenac Sodium (VOLTAREN) 1 % GEL Apply 2 g topically 4 (four) times daily.  Patient not taking: Reported on 06/30/2020    [provider]  dorzolamide-timolol (COSOPT) 22.3-6.8 MG/ML ophthalmic solution PLACE 1 DROP INTO BOTH EYES 2 TIMES A DAY 07/01/20   Bernarda Caffey, MD  fluorouracil CALGB 16109 in sodium chloride 0.9 % 150 mL Inject into the vein over 48 hr. 05/19/20   [provider]  FLUOROURACIL IV Inject into the vein every 14 (fourteen) days. 05/19/20   [provider]  LEUCOVORIN CALCIUM IV Inject into the vein every 14 (fourteen) days. 05/19/20   [provider]  lidocaine-prilocaine (EMLA) cream Apply a small amount to  port a cath site and cover with plastic wrap 1 hour prior to chemotherapy appointments Patient not taking: Reported on 06/22/2020 05/14/20   Derek Jack, MD  losartan-hydrochlorothiazide (HYZAAR) 100-25 MG tablet Take 1 tablet by mouth daily. 06/24/20   [provider]  OXALIPLATIN IV Inject into the vein every 14 (fourteen) days. 05/19/20   [provider]    Physical Exam: BP (!) 98/43   Pulse 90   Temp 99.3 F (37.4 C) (Rectal)   Resp 14   SpO2 100%   . General: 74 y.o. year-old female well developed well nourished in no acute distress.  Alert and oriented x3. Marland Kitchen HEENT: NCAT, moist mucous membrane. . Neck: Supple, trachea medial . Cardiovascular: Regular rate and rhythm with no rubs or gallops.  No thyromegaly or JVD noted.  No lower extremity edema. 2/4 pulses in all 4 extremities. Marland Kitchen Respiratory: Clear to auscultation with no wheezes or rales.  Good inspiratory effort. . Abdomen: Soft nontender nondistended with normal bowel sounds x4 quadrants. . Muskuloskeletal: No cyanosis, clubbing or edema noted bilaterally . Neuro: Slurred speech.  5/5 strength x4 extremities.  CN II-XII intact, normal sensation . Skin: No ulcerative lesions noted or rashes . Psychiatry: Mood is appropriate for condition and setting          Labs on Admission:  Basic Metabolic Panel: Recent Labs  Lab 06/30/20 0758 07/04/20 2022  NA 136 131*  K 3.3* 3.0*  CL 101 97*  CO2 25 25  GLUCOSE 107* 126*  BUN 16 28*  CREATININE 1.03* 1.84*  CALCIUM 8.8* 7.6*  MG 1.8  --    Liver Function Tests: Recent Labs  Lab 06/30/20 0758 07/04/20 2022  AST 14* 8*  ALT 10 6  ALKPHOS 92 70  BILITOT 0.5 0.6  PROT 6.7 5.9*  ALBUMIN 3.4* 2.7*   No results for input(s): LIPASE, AMYLASE in the last 168 hours. No results for input(s): AMMONIA in the last 168 hours. CBC: Recent Labs  Lab 06/30/20 0758 07/04/20 2022  WBC 10.1 16.2*  NEUTROABS 8.0* 14.8*  HGB 10.3* 8.8*  HCT 32.5* 27.7*  MCV 97.0 98.6  PLT 149* 165   Cardiac Enzymes: No results for input(s): CKTOTAL, CKMB, CKMBINDEX, TROPONINI in the last 168 hours.  BNP (last 3 results) No results for input(s): BNP in the last 8760 hours.  ProBNP (last 3 results) No results for input(s): PROBNP in the last 8760 hours.  CBG: Recent Labs  Lab 07/04/20 2024  GLUCAP 122*    Radiological Exams on Admission: CT CEREBRAL PERFUSION W CONTRAST  Result Date: 07/04/2020 CLINICAL DATA:  Aphasia.  Code stroke. EXAM: CT ANGIOGRAPHY HEAD AND NECK CT PERFUSION BRAIN TECHNIQUE: Multidetector CT imaging of the head and neck was performed using the standard protocol during bolus administration of intravenous contrast. Multiplanar CT image reconstructions and MIPs were obtained to evaluate the vascular anatomy. Carotid stenosis measurements (when applicable) are obtained utilizing NASCET criteria, using the distal  internal carotid diameter as the denominator. Multiphase CT imaging of the brain was performed following IV bolus contrast injection. Subsequent parametric perfusion maps were calculated using RAPID software. CONTRAST:  69mL OMNIPAQUE IOHEXOL 350 MG/ML SOLN COMPARISON:  Noncontrast head CT performed earlier the same day 07/04/2020. FINDINGS: CTA NECK FINDINGS Aortic arch: Standard aortic branching. Atherosclerotic plaque within the visualized aortic arch and proximal major branch vessels of the neck. No hemodynamically significant innominate or proximal subclavian artery stenosis. Right carotid system: CCA and ICA patent within the neck without  significant stenosis (50% or greater). Mild mixed plaque within the carotid bifurcation Left carotid system: CCA and ICA patent within the neck without significant stenosis (50% or greater). Minimal mixed plaque within the CCA and carotid bifurcation. Vertebral arteries: Codominant patent within the neck bilaterally. Moderate atherosclerotic narrowing at the origin of the left vertebral artery. Skeleton: No acute bony abnormality Other neck: No neck mass or cervical lymphadenopathy. Subcentimeter right thyroid lobe nodule not meeting consensus criteria for ultrasound follow-up. Upper chest: No consolidation within the imaged lung apices Review of the MIP images confirms the above findings CTA HEAD FINDINGS Anterior circulation: The intracranial internal carotid arteries are patent. Calcified plaque within both vessels. Moderate stenosis of the supraclinoid left ICA. Otherwise, no more than mild narrowing of these vessels. The M1 middle cerebral arteries are patent. There is a high-grade focal stenosis within the mid M1 left MCA. Atherosclerotic irregularity of the M2 and more distal MCA branch vessels bilaterally. Most notably, there is a high-grade focal stenosis within a proximal M2 left MCA branch vessel (series 9, image 28). Additionally, there is a high-grade stenosis  within a mid M2 MCA branch vessel on the right (series 9, image 15). No proximal branch occlusion is identified. The anterior cerebral arteries are patent. There is atherosclerotic irregularity of both anterior cerebral arteries. Most notably, there is a high-grade focal stenosis within the proximal A2 right anterior cerebral artery (series 4, image 92). Additionally, there is a high-grade focal stenosis within the left anterior cerebral artery at the A2/A3 junction. No intracranial aneurysm is identified. Posterior circulation: The intracranial vertebral arteries are patent. There is a moderate/severe focal stenosis within the V4 right vertebral artery at the level of the skull base. The basilar artery is patent without significant stenosis. Fetal origin left posterior cerebral artery. There is a moderate/severe focal stenosis within the P2 left PCA (series 9, image 24). High-grade focal stenosis within the left PCA at the P3/P4 junction. Sites of moderate stenosis within the P2 right PCA. Additionally, there are sites of severe stenosis within the right PCA at the P3/P4 junction. The right posterior communicating artery is hypoplastic or absent Venous sinuses: Within limitations of contrast timing, no convincing thrombus. Anatomic variants: As described Review of the MIP images confirms the above findings CT Brain Perfusion Findings: ASPECTS: 10 CBF (<30%) Volume: 44mL Perfusion (Tmax>6.0s) volume: 57mL. However, there is a 5 mL region within the left frontal lobe, within the left MCA vascular territory, demonstrating a Tmax greater than 4 seconds. Mismatch Volume: 24mL Infarction Location:None identified These results were called by telephone at the time of interpretation on 07/04/2020 at 9:24 pm to provider Dr. Regenia Skeeter, who verbally acknowledged these results. IMPRESSION: CTA neck: 1. The bilateral common and internal carotid arteries are patent within the neck without hemodynamically significant stenosis. 2. The  vertebral arteries are patent within the neck bilaterally. Moderate atherosclerotic narrowing at the origin of the left vertebral artery. CTA head: 1. No intracranial large vessel occlusion. 2. Intracranial atherosclerotic disease with multifocal stenoses, most notably as follows. 3. High-grade focal stenosis within the M1 left middle cerebral artery. 4. High-grade focal stenosis within a proximal M2 left MCA branch vessel. 5. High-grade focal stenosis within a mid M2 right MCA branch vessel. 6. High-grade focal stenosis within the proximal A2 right ACA. 7. High-grade focal stenosis within the left ACA at the A2/A3 junction. 8. Moderate stenosis of the supraclinoid left ICA. 9. Moderate/severe focal stenosis within the right vertebral artery at the level of the  skull base. 10. Moderate/severe focal stenosis within the P2 left PCA. 11. High-grade focal stenosis within the left PCA at the P3/P4 junction. 12. Sites of moderate stenosis within the P2 right PCA. 13. High-grade stenoses within the right PCA at the P3/P4 junction. CT perfusion head: The perfusion software identifies no core infarct. The perfusion software identifies no critically hypoperfused parenchyma utilizing the Tmax >6 seconds threshold. However, there is a 5 mL region within the left frontal lobe demonstrating a Tmax >4 seconds within the left MCA vascular territory. Electronically Signed   By: Kellie Simmering DO   On: 07/04/2020 21:24   DG Chest Portable 1 View  Result Date: 07/04/2020 CLINICAL DATA:  Altered mental status EXAM: PORTABLE CHEST 1 VIEW COMPARISON:  05/18/2020 FINDINGS: Cardiac shadow is stable. Aortic calcifications are seen. Left chest wall port is noted and stable. The lungs are well aerated without focal infiltrate or sizable effusion. Chronic interstitial changes are again seen. No new focal abnormality is noted. IMPRESSION: No acute abnormality noted. Electronically Signed   By: Inez Catalina M.D.   On: 07/04/2020 21:14   CT  HEAD CODE STROKE WO CONTRAST  Result Date: 07/04/2020 CLINICAL DATA:  Neuro deficit, acute, stroke suspected. Additional provided: Aphasia, last known well 3:30 p.m., neighbor stated when they went to check on patient she could not speak. EXAM: CT HEAD WITHOUT CONTRAST TECHNIQUE: Contiguous axial images were obtained from the base of the skull through the vertex without intravenous contrast. COMPARISON:  No pertinent prior exams are available for comparison. FINDINGS: Brain: Cerebral volume is normal for age. Minimal ill-defined hypoattenuation within the cerebral white matter is nonspecific, but consistent with chronic small vessel ischemic disease. There is no acute intracranial hemorrhage. No demarcated cortical infarct is identified. No extra-axial fluid collection. No evidence of intracranial mass. No midline shift. Vascular: No hyperdense vessel Skull: Normal. Negative for fracture or focal lesion. Sinuses/Orbits: Visualized orbits show no acute finding. Mild ethmoid sinus mucosal thickening. No significant mastoid effusion at the imaged levels ASPECTS [facility] Stroke Program Early CT Score) - Ganglionic level infarction (caudate, lentiform nuclei, internal capsule, insula, M1-M3 cortex): 7 - Supraganglionic infarction (M4-M6 cortex): 3 Total score (0-10 with 10 being normal): 10 These results were called by telephone at the time of interpretation on 07/04/2020 at 8:24 pm to provider Sherwood Gambler , who verbally acknowledged these results. IMPRESSION: 1. No CT evidence of acute intracranial abnormality.  ASPECTS is 10. 2. Mild chronic small vessel ischemic changes within the cerebral white matter. 3. Mild ethmoid sinus mucosal thickening. Electronically Signed   By: Kellie Simmering DO   On: 07/04/2020 20:24   CT ANGIO NECK CODE STROKE  Result Date: 07/04/2020 CLINICAL DATA:  Aphasia.  Code stroke. EXAM: CT ANGIOGRAPHY HEAD AND NECK CT PERFUSION BRAIN TECHNIQUE: Multidetector CT imaging of the head and neck  was performed using the standard protocol during bolus administration of intravenous contrast. Multiplanar CT image reconstructions and MIPs were obtained to evaluate the vascular anatomy. Carotid stenosis measurements (when applicable) are obtained utilizing NASCET criteria, using the distal internal carotid diameter as the denominator. Multiphase CT imaging of the brain was performed following IV bolus contrast injection. Subsequent parametric perfusion maps were calculated using RAPID software. CONTRAST:  62mL OMNIPAQUE IOHEXOL 350 MG/ML SOLN COMPARISON:  Noncontrast head CT performed earlier the same day 07/04/2020. FINDINGS: CTA NECK FINDINGS Aortic arch: Standard aortic branching. Atherosclerotic plaque within the visualized aortic arch and proximal major branch vessels of the neck. No hemodynamically significant  innominate or proximal subclavian artery stenosis. Right carotid system: CCA and ICA patent within the neck without significant stenosis (50% or greater). Mild mixed plaque within the carotid bifurcation Left carotid system: CCA and ICA patent within the neck without significant stenosis (50% or greater). Minimal mixed plaque within the CCA and carotid bifurcation. Vertebral arteries: Codominant patent within the neck bilaterally. Moderate atherosclerotic narrowing at the origin of the left vertebral artery. Skeleton: No acute bony abnormality Other neck: No neck mass or cervical lymphadenopathy. Subcentimeter right thyroid lobe nodule not meeting consensus criteria for ultrasound follow-up. Upper chest: No consolidation within the imaged lung apices Review of the MIP images confirms the above findings CTA HEAD FINDINGS Anterior circulation: The intracranial internal carotid arteries are patent. Calcified plaque within both vessels. Moderate stenosis of the supraclinoid left ICA. Otherwise, no more than mild narrowing of these vessels. The M1 middle cerebral arteries are patent. There is a high-grade  focal stenosis within the mid M1 left MCA. Atherosclerotic irregularity of the M2 and more distal MCA branch vessels bilaterally. Most notably, there is a high-grade focal stenosis within a proximal M2 left MCA branch vessel (series 9, image 28). Additionally, there is a high-grade stenosis within a mid M2 MCA branch vessel on the right (series 9, image 15). No proximal branch occlusion is identified. The anterior cerebral arteries are patent. There is atherosclerotic irregularity of both anterior cerebral arteries. Most notably, there is a high-grade focal stenosis within the proximal A2 right anterior cerebral artery (series 4, image 92). Additionally, there is a high-grade focal stenosis within the left anterior cerebral artery at the A2/A3 junction. No intracranial aneurysm is identified. Posterior circulation: The intracranial vertebral arteries are patent. There is a moderate/severe focal stenosis within the V4 right vertebral artery at the level of the skull base. The basilar artery is patent without significant stenosis. Fetal origin left posterior cerebral artery. There is a moderate/severe focal stenosis within the P2 left PCA (series 9, image 24). High-grade focal stenosis within the left PCA at the P3/P4 junction. Sites of moderate stenosis within the P2 right PCA. Additionally, there are sites of severe stenosis within the right PCA at the P3/P4 junction. The right posterior communicating artery is hypoplastic or absent Venous sinuses: Within limitations of contrast timing, no convincing thrombus. Anatomic variants: As described Review of the MIP images confirms the above findings CT Brain Perfusion Findings: ASPECTS: 10 CBF (<30%) Volume: 38mL Perfusion (Tmax>6.0s) volume: 70mL. However, there is a 5 mL region within the left frontal lobe, within the left MCA vascular territory, demonstrating a Tmax greater than 4 seconds. Mismatch Volume: 76mL Infarction Location:None identified These results were called  by telephone at the time of interpretation on 07/04/2020 at 9:24 pm to provider Dr. Regenia Skeeter, who verbally acknowledged these results. IMPRESSION: CTA neck: 1. The bilateral common and internal carotid arteries are patent within the neck without hemodynamically significant stenosis. 2. The vertebral arteries are patent within the neck bilaterally. Moderate atherosclerotic narrowing at the origin of the left vertebral artery. CTA head: 1. No intracranial large vessel occlusion. 2. Intracranial atherosclerotic disease with multifocal stenoses, most notably as follows. 3. High-grade focal stenosis within the M1 left middle cerebral artery. 4. High-grade focal stenosis within a proximal M2 left MCA branch vessel. 5. High-grade focal stenosis within a mid M2 right MCA branch vessel. 6. High-grade focal stenosis within the proximal A2 right ACA. 7. High-grade focal stenosis within the left ACA at the A2/A3 junction. 8. Moderate stenosis of the  supraclinoid left ICA. 9. Moderate/severe focal stenosis within the right vertebral artery at the level of the skull base. 10. Moderate/severe focal stenosis within the P2 left PCA. 11. High-grade focal stenosis within the left PCA at the P3/P4 junction. 12. Sites of moderate stenosis within the P2 right PCA. 13. High-grade stenoses within the right PCA at the P3/P4 junction. CT perfusion head: The perfusion software identifies no core infarct. The perfusion software identifies no critically hypoperfused parenchyma utilizing the Tmax >6 seconds threshold. However, there is a 5 mL region within the left frontal lobe demonstrating a Tmax >4 seconds within the left MCA vascular territory. Electronically Signed   By: Kellie Simmering DO   On: 07/04/2020 21:24    EKG: I independently viewed the EKG done and my findings are as followed: Sinus rhythm at rate of 96 bpm, T wave inversion in V5-V6.  Assessment/Plan Present on Admission: . Essential hypertension . Other hyperlipidemia .  Malignant neoplasm of colon ([place]) . Hypokalemia  Principal Problem:   Sepsis secondary to UTI [facility]) Active Problems:   Essential hypertension   Other hyperlipidemia   Malignant neoplasm of colon ([facility])   Hypokalemia   Hypoalbuminemia   Hyponatremia   Leukocytosis   Stroke (cerebrum) ([facility])   Sepsis secondary to UTI Patient was febrile, tachycardic( HR >90) and has leukocytosis (WBC 16.2)-meet SIRS criteria  Source of infection (urinalysis positive for UTI) thereby meeting sepsis criteria IV hydration with LR provided per sepsis protocol Patient was started on IV ceftriaxone, we shall continue with same at this time Urine culture pending  Slurred speech rule out acute ischemic stroke CT of head without contrast showed no acute intracranial abnormality.   CT angiography of neck showed patent bilateral common and internal carotid arteries, patent vertebral arteries within the neck bilaterally were also noted.   CT angiography of the head showed no intracranial large vessel occlusion but shows several high-grade focal stenosis within left and right MCA branch and right and left ACA.  High-grade focal stenosis was also noted within the left PCA.  Teleneurologist was consulted and recommended further stroke work-up Patient will be admitted to telemetry unit  Echocardiogram done on 04/07/2020 showed LVEF of 65 to 70% but showed grade 1 diastolic dysfunction (impaired relaxation) Limited echocardiogram in the morning MRI of brain without contrast in the morning Continue aspirin 325 mg per teleneurologist recommendation  Continue fall precautions and neuro checks Lipid panel and hemoglobin A1c will be checked Continue PT/ST/OT eval and treat Bedside swallow eval by nursing prior to diet Teleneurology at [facility] consulted and will see patient when patient [facility]  Leukocytosis possibly due to sepsis WBC 16.2, continue management as described above for  sepsis  Hypokalemia/Hyponatremia K+ 3.0, this will be replenished Na 131; IV hydration provided  Continue to monitor BMP with morning labs  Acute kidney injury on CKD IV Creatinine on admission= 1.84, baseline creatinine = 1.0-1.2 GFR 27 Renally adjust medications, avoid nephrotoxic agents/dehydration/hypotension  Essential hypertension Continue amlodipine Hyzaar will be temporarily held due to AKI  Hyperlipidemia  Continue Lipitor per home regimen when med rec is updated  Iron deficiency anemia Continue ferrous sulfate per home regimen  Stage IIIb poorly differentiated right: Adenocarcinoma Patient will continue with chemotherapy on discharge She follows with Dr. Delton Coombes per medical record   DVT prophylaxis: Lovenox  Code Status: DNR  Family Communication: None at bedside  Disposition Plan:  Patient is from:  home Anticipated DC to:                   SNF or family members home Anticipated DC date:               2-3 days Anticipated DC barriers:       Unstable to discharge at this time due to sepsis secondary to UTI and slurred speech possibly due to acute ischemic stroke which requires further stroke work-up and neurology follow-up per recommendation.  PT/ST/OT eval, treatment and recommendation pending   Consults called: Teleneurologist (Dr. Elzie Rings); neurologist at [facility] (Dr. Rory Percy)  Admission status: Inpatient    Bernadette Hoit MD [facility]  If 7PM-7AM, please contact night-coverage [email or web address]  07/05/2020, 12:46 AM

## 2020-07-04 NOTE — Consult Note (Signed)
[facility] Tele[facility]urology Consult Services   Date of Service:   07/04/2020 20:21:01  Impression:     .  I63.30 - Cerebrovascular accident (CVA) due to thrombosis of cerebral artery ([place])  Comments/Sign-Out: Pt presents with aphasia. She is outside the time window for IV tpa. Her imaging reveals multifocal stenosis throughout, particularly L MCA stenosis correlates with current deficit. Pt to be transferred to stroke center for evaluation by NIR.  Metrics: Last Known Well: 07/04/2020 15:30:20 [facility] Notification Time: 07/04/2020 20:21:00 Arrival Time: 07/04/2020 20:11:38 Stamp Time: 07/04/2020 20:21:01 Time First Login Attempt: 07/04/2020 20:26:42 Symptoms: aphasia NIHSS Start Assessment Time: 07/04/2020 20:33:29 Patient is not a candidate for Thrombolytic. Thrombolytic Medical Decision: 07/04/2020 20:33:22 Patient was not deemed candidate for Thrombolytic because of following reasons: Last Well Known Above 4.5 Hours.  CT head showed no acute hemorrhage or acute core infarct.  ED Physician notified of diagnostic impression and management plan on 07/04/2020 22:43:53  Advanced Imaging: CTA Head and Neck Completed.  CTP Completed.  LVO:No   Our recommendations are outlined below.  Recommendations:     .  Activate Stroke Protocol Admission/Order Set     .  Stroke/Telemetry Floor     .  Neuro Checks     .  Bedside Swallow Eval     .  DVT Prophylaxis     .  IV Fluids, Normal Saline     .  Head of Bed 30 Degrees     .  Euglycemia and Avoid Hyperthermia (PRN Acetaminophen)     .  Initiate Aspirin 325 MG Daily  Routine Consultation with [facility] for Follow up Care  Sign Out:     .  Discussed with Emergency Department Provider    ------------------------------------------------------------------------------  History of Present Illness: Patient is a 73 year old Female.  Patient was brought by EMS for symptoms of aphasia  Pt  was last seen well approximately 4 hours before call to EMS by neighbors. She was found to be aphasic and remains so right now. She is able to mimic but no usable speech. She was hypotensive upon arrival. Pertinent history also includes colon CA and she is currently undergoing chemotherapy.  Last seen normal was beyond 4.5 hours of presentation.  Past Medical History:     . Hypertension     . Hyperlipidemia     . There is NO history of Diabetes Mellitus     . There is NO history of Atrial Fibrillation     . There is NO history of Coronary Artery Disease     . There is NO history of Stroke  Social History:Unable to obtain due to Patient Status  Anticoagulant use:  No  Antiplatelet use: No     Examination: BP(92/51), Pulse(60), Blood Glucose(122) 1A: Level of Consciousness - Alert; keenly responsive + 0 1B: Ask Month and Age - Aphasic + 2 1C: Blink Eyes & Squeeze Hands - Performs 1 Task + 1 2: Test Horizontal Extraocular Movements - Normal + 0 3: Test Visual Fields - No Visual Loss + 0 4: Test Facial Palsy (Use Grimace if Obtunded) - Normal symmetry + 0 5A: Test Left Arm Motor Drift - No Drift for 10 Seconds + 0 5B: Test Right Arm Motor Drift - No Drift for 10 Seconds + 0 6A: Test Left Leg Motor Drift - No Drift for 5 Seconds + 0 6B: Test Right Leg Motor Drift - No Drift for 5 Seconds + 0 7: Test Limb Ataxia (FNF/Heel-Shin) -  No Ataxia + 0 8: Test Sensation - Normal; No sensory loss + 0 9: Test Language/Aphasia - Severe Aphasia: Fragmentary Expression, Inference Needed, Cannot Identify Materials + 2 10: Test Dysarthria - Normal + 0 11: Test Extinction/Inattention - No abnormality + 0  NIHSS Score: 5  Pre-Morbid Modified Rankin Scale: Unable to assess   Patient/Family was informed the Neurology Consult would occur via TeleHealth consult by way of interactive audio and video telecommunications and consented to receiving care in this manner.   Patient is being evaluated for  possible acute neurologic impairment and high probability of imminent or life-threatening deterioration. I spent total of 35 minutes providing care to this patient, including time for face to face visit via telemedicine, review of medical records, imaging studies and discussion of findings with providers, the patient and/or family.   Dr Elzie Rings   [facility] [phone]  Case [id number]

## 2020-07-05 ENCOUNTER — Inpatient Hospital Stay (HOSPITAL_COMMUNITY): Payer: Medicare HMO

## 2020-07-05 DIAGNOSIS — I480 Paroxysmal atrial fibrillation: Secondary | ICD-10-CM | POA: Diagnosis not present

## 2020-07-05 DIAGNOSIS — R000 Tachycardia, unspecified: Secondary | ICD-10-CM | POA: Diagnosis not present

## 2020-07-05 DIAGNOSIS — N390 Urinary tract infection, site not specified: Secondary | ICD-10-CM | POA: Diagnosis not present

## 2020-07-05 DIAGNOSIS — E785 Hyperlipidemia, unspecified: Secondary | ICD-10-CM | POA: Diagnosis not present

## 2020-07-05 DIAGNOSIS — Z7409 Other reduced mobility: Secondary | ICD-10-CM | POA: Diagnosis not present

## 2020-07-05 DIAGNOSIS — I10 Essential (primary) hypertension: Secondary | ICD-10-CM | POA: Diagnosis not present

## 2020-07-05 DIAGNOSIS — I639 Cerebral infarction, unspecified: Secondary | ICD-10-CM

## 2020-07-05 DIAGNOSIS — D72829 Elevated white blood cell count, unspecified: Secondary | ICD-10-CM | POA: Diagnosis not present

## 2020-07-05 DIAGNOSIS — E8809 Other disorders of plasma-protein metabolism, not elsewhere classified: Secondary | ICD-10-CM

## 2020-07-05 DIAGNOSIS — A419 Sepsis, unspecified organism: Principal | ICD-10-CM

## 2020-07-05 DIAGNOSIS — R471 Dysarthria and anarthria: Secondary | ICD-10-CM | POA: Diagnosis not present

## 2020-07-05 DIAGNOSIS — I6389 Other cerebral infarction: Secondary | ICD-10-CM

## 2020-07-05 DIAGNOSIS — E871 Hypo-osmolality and hyponatremia: Secondary | ICD-10-CM

## 2020-07-05 DIAGNOSIS — A0472 Enterocolitis due to Clostridium difficile, not specified as recurrent: Secondary | ICD-10-CM | POA: Diagnosis not present

## 2020-07-05 DIAGNOSIS — E119 Type 2 diabetes mellitus without complications: Secondary | ICD-10-CM | POA: Diagnosis not present

## 2020-07-05 DIAGNOSIS — G459 Transient cerebral ischemic attack, unspecified: Secondary | ICD-10-CM | POA: Diagnosis not present

## 2020-07-05 DIAGNOSIS — B952 Enterococcus as the cause of diseases classified elsewhere: Secondary | ICD-10-CM | POA: Diagnosis not present

## 2020-07-05 HISTORY — DX: Cerebral infarction, unspecified: I63.9

## 2020-07-05 HISTORY — DX: Paroxysmal atrial fibrillation: I48.0

## 2020-07-05 LAB — LIPID PANEL
Cholesterol: 95 mg/dL (ref 0–200)
HDL: 35 mg/dL — ABNORMAL LOW (ref >40)
LDL Cholesterol: 45 mg/dL (ref 0–99)
Total CHOL/HDL Ratio: 2.7 RATIO
Triglycerides: 75 mg/dL (ref <150)
VLDL: 15 mg/dL (ref 0–40)

## 2020-07-05 LAB — CBC
HCT: 32.2 % — ABNORMAL LOW (ref 36.0–46.0)
Hemoglobin: 9.8 g/dL — ABNORMAL LOW (ref 12.0–15.0)
MCH: 29.8 pg (ref 26.0–34.0)
MCHC: 30.4 g/dL (ref 30.0–36.0)
MCV: 97.9 fL (ref 80.0–100.0)
Platelets: 171 10*3/uL (ref 150–400)
RBC: 3.29 MIL/uL — ABNORMAL LOW (ref 3.87–5.11)
RDW: 16.1 % — ABNORMAL HIGH (ref 11.5–15.5)
WBC: 13.1 10*3/uL — ABNORMAL HIGH (ref 4.0–10.5)
nRBC: 0 % (ref 0.0–0.2)

## 2020-07-05 LAB — ECHOCARDIOGRAM COMPLETE
AR max vel: 1.79 cm2
AV Peak grad: 13.2 mmHg
Ao pk vel: 1.82 m/s
Area-P 1/2: 5.2 cm2
S' Lateral: 2.35 cm

## 2020-07-05 LAB — CORTISOL-AM, BLOOD: Cortisol - AM: 36.5 ug/dL — ABNORMAL HIGH (ref 6.7–22.6)

## 2020-07-05 LAB — PROTIME-INR
INR: 1.2 (ref 0.8–1.2)
Prothrombin Time: 15.2 seconds (ref 11.4–15.2)

## 2020-07-05 LAB — COMPREHENSIVE METABOLIC PANEL
ALT: 6 U/L (ref 0–44)
AST: 7 U/L — ABNORMAL LOW (ref 15–41)
Albumin: 2.5 g/dL — ABNORMAL LOW (ref 3.5–5.0)
Alkaline Phosphatase: 76 U/L (ref 38–126)
Anion gap: 7 (ref 5–15)
BUN: 22 mg/dL (ref 8–23)
CO2: 24 mmol/L (ref 22–32)
Calcium: 7.5 mg/dL — ABNORMAL LOW (ref 8.9–10.3)
Chloride: 103 mmol/L (ref 98–111)
Creatinine, Ser: 1.29 mg/dL — ABNORMAL HIGH (ref 0.44–1.00)
GFR calc Af Amer: 48 mL/min — ABNORMAL LOW (ref >60)
GFR calc non Af Amer: 41 mL/min — ABNORMAL LOW (ref >60)
Glucose, Bld: 110 mg/dL — ABNORMAL HIGH (ref 70–99)
Potassium: 3.1 mmol/L — ABNORMAL LOW (ref 3.5–5.1)
Sodium: 134 mmol/L — ABNORMAL LOW (ref 135–145)
Total Bilirubin: 0.5 mg/dL (ref 0.3–1.2)
Total Protein: 5.6 g/dL — ABNORMAL LOW (ref 6.5–8.1)

## 2020-07-05 LAB — HEMOGLOBIN A1C
Hgb A1c MFr Bld: 5 % (ref 4.8–5.6)
Mean Plasma Glucose: 96.8 mg/dL

## 2020-07-05 LAB — PROCALCITONIN: Procalcitonin: 0.51 ng/mL

## 2020-07-05 LAB — TROPONIN I (HIGH SENSITIVITY)
Troponin I (High Sensitivity): 10 ng/L (ref <18)
Troponin I (High Sensitivity): 10 ng/L (ref <18)

## 2020-07-05 MED ORDER — AMLODIPINE BESYLATE 5 MG PO TABS
5.0000 mg | ORAL_TABLET | Freq: Every day | ORAL | Status: DC
  Filled 2020-07-05: qty 1

## 2020-07-05 MED ORDER — ATORVASTATIN CALCIUM 10 MG PO TABS
20.0000 mg | ORAL_TABLET | Freq: Every day | ORAL | Status: DC
  Administered 2020-07-05: 20 mg via ORAL
  Filled 2020-07-05: qty 2

## 2020-07-05 MED ORDER — FERROUS SULFATE 325 (65 FE) MG PO TABS
325.0000 mg | ORAL_TABLET | Freq: Two times a day (BID) | ORAL | Status: DC
  Administered 2020-07-05: 325 mg via ORAL
  Filled 2020-07-05: qty 1

## 2020-07-05 NOTE — ED Notes (Signed)
Echocardiogram in process at this time.

## 2020-07-05 NOTE — Discharge Summary (Signed)
Gabrielle [name], is a 74 y.o. female  DOB 1946-11-28  MRN [id number].  Admission date:  07/04/2020  Admitting Physician  Bernadette Hoit, DO  Discharge Date:  07/05/2020   Primary MD  Janora Norlander, DO  Recommendations for primary care physician for things to follow:    Admission Diagnosis  Sepsis [facility]) [A41.9]   Discharge Diagnosis  Sepsis ([place]) [A41.9]    Principal Problem:   Sepsis secondary to UTI [facility]) Active Problems:   Essential hypertension   Other hyperlipidemia   Malignant neoplasm of colon ([place])   Hypokalemia   Iron deficiency anemia due to chronic blood loss   Hypoalbuminemia   Hyponatremia   Leukocytosis   Stroke (cerebrum) ([place])      Past Medical History:  Diagnosis Date  . Arthritis   . Cancer ([place])   . Cataract    OU  . Colon cancer ([place])   . H/O cesarean section   . Hx of tonsillectomy   . Hyperlipidemia   . Hypertension   . Macular degeneration    Exu ARMD OU    Past Surgical History:  Procedure Laterality Date  . BIOPSY  04/06/2020   Procedure: BIOPSY;  Surgeon: Rogene Houston, MD;  Location: [facility] ENDO SU[facility];  Service: Endoscopy;;  . CARPAL TUNNEL RELEASE Right   . CATARACT EXTRACTION W/PHACO Left 10/11/2019   Procedure: CATARACT EXTRACTION PHACO AND INTRAOCULAR LENS PLACEMENT (IOC);  Surgeon: Baruch Goldmann, MD;  Location: [facility] ORS;  Service: Ophthalmology;  Laterality: Left;  CDE: 8.56  . CATARACT EXTRACTION W/PHACO Right 10/25/2019   Procedure: CATARACT EXTRACTION PHACO AND INTRAOCULAR LENS PLACEMENT (IOC);  Surgeon: Baruch Goldmann, MD;  Location: [facility] ORS;  Service: Ophthalmology;  Laterality: Right;  CDE: 5.67  . CESAREAN SECTION    . COLONOSCOPY N/A 04/06/2020   Procedure: COLONOSCOPY;  Surgeon: Rogene Houston, MD;  Location: [facility] END[facility] SU[facility];  Service: Endoscopy;  Laterality: N/A;  . CYSTOSCOPY WITH BIOPSY N/A 04/08/2020   Procedure: CYSTOSCOPY WITH BIOPSY;   Surgeon: Cleon Gustin, MD;  Location: [facility] ORS;  Service: Urology;  Laterality: N/A;  . ESOPHAGOGASTRODUODENOSCOPY N/A 04/05/2020   Procedure: ESOPHAGOGASTRODUODENOSCOPY (EGD);  Surgeon: Rogene Houston, MD;  Location: [facility];  Service: Endoscopy;  Laterality: N/A;  . PARTIAL COLECTOMY N/A 04/08/2020   Procedure: PARTIAL COLECTOMY;  Surgeon: Virl Cagey, MD;  Location: [facility] ORS;  Service: General;  Laterality: N/A;  . PORTACATH PLACEMENT Left 05/18/2020   Procedure: INSERTION PORT-A-CATH (ATTACHED CATHETER IN LEFT SUBCLAVIAN);  Surgeon: Virl Cagey, MD;  Location: [facility] ORS;  Service: General;  Laterality: Left;  . TONSILLECTOMY         HPI  from the history and physical done on the day of admission:    Chief Complaint: Slurred speech  HPI: Gabrielle [name] is a 74 y.o. female with medical history significant for stage IIIB poorly differentiated right colon adenocarcinoma recently started on chemotherapy, hypertension, hyperlipidemia.  She is s/p right hemicolectomy and she presents to the emergency department  today via EMS due to difficulty in being able to speak.  Patient was unable to provide history due to trouble speaking, history was obtained from ED physician and ED chart.  Per report, patient was last seen normal at baseline around 3:30 PM by neighbor and she did not arrive at the ED until 7:30 PM.  Patient was reported to have been sick with her most recent chemo treatment resulting in diarrhea and weakness (she was admitted on 6/29 and discharged on 7/2 due to severe C. difficile enterocolitis, ileus/partial SBO which resolved prior to discharge).  There was no report of fever, chills, nausea, vomiting or abdominal pain.  ED Course:  In the emergency department, she was noted to be febrile with a temperature of 100.17F, BP 87/45 (MAP 59) and tachycardic.  Work-up in the ED showed leukocytosis, normocytic anemia, hyponatremia, hypokalemia, BUN to creatinine 28/1.84  (baseline creatinine at 1.0-1.2), hypoalbuminemia, SARS coronavirus 2 was negative.  Urinalysis was positive for UTI.  CT of head without contrast showed no acute intracranial abnormality.  CT angiography of neck showed patent bilateral common and internal carotid arteries, patent vertebral arteries within the neck bilaterally were also noted.  CT angiography of the head showed no intracranial large vessel occlusion but shows several high-grade focal stenosis within left and right MCA branch and right and left ACA.  High-grade focal stenosis was also noted within the left PCA. Teleneurologist was consulted and recommended further stroke work-up. Neurologist at [facility] (Dr. Malen Gauze) was also consulted and recommend transfer of patient to [facility] with plan to see patient when patient arrives at the campus.   She was empirically started on IV ceftriaxone, IV hydration per sepsis protocol was provided.    Hospital Course:   Got a call from patient's RN Gerda Diss that patient and daughter had further questions and were expressing desire to possibly leave AMA and go to [facility]  I called and discussed patient's care  with pt and daughter--- plan of care discussed,  questions answered -After extensive conversation with patient and daughter--they expressed concerns about their previous hospital stay and previous experience within the [facility] system  -- -Patient seen and reevaluated again this afternoon--- patient again reports to me that she intends to leave AMA and go to [facility]  -Patient and patient's daughter both tell me that patient had Requested not to be brought to [facility] initially, she wanted to go to [facility] , but [place] brought her here to [facility] for code stroke, rather than taking her  [facility] as she has requested initially -- -Copies of patient's medical records including imaging and lab reports printed and given to patient---   A/p 1)  speech disturbance/aphasia--- suspect acute stroke, neuroimaging studies reviewed with patient and daughter -Echo and lipid panel as well other lab findings are reviewed-- As noted above patient and daughter decided to leave AMA and go to [facility] -Specifically declined transfer to [facility] for in-house neurology evaluation and brain MRI  2)Sepsis secondary to UTI---POA--patient met sepsis criteria on admission was started on IV Rocephin pending cultures --Patient declined further treatment and left AMA  3) hypokalemia/hyponatremia-sodium is up to 134 from 131,  potassium is 3.1--- patient left AMA  4)AKi on CKD IIIb----renal function worsened in the setting of diarrhea and poor oral intake with persistent nausea, compounded by losartan HCTZ use  ---creatinine is down to 1.29 from 1.84 on admission, baseline creatinine usually 1.0 -  Patient declines further intervention left AMA  5)Diarrhea and nausea with dry heaving----recent treatment for C. difficile infection cannot rule out recurrent C. difficile infection --- Patient declines further interventions, she left AMA  HTN, HLD, anemia, history of stage IIIb poorly differentiated adenocarcinoma of the right colon----patient left AMA, declined further interventions  --Patient and Daughter  left AMA from the ED  Discharge Condition: Patient left AMA  Follow UP     Consults obtained -telemetry neuro  Diet and Activity recommendation:  As advised  Discharge Instructions       Discharge Medications       Major procedures and Radiology Reports - PLEASE review detailed and final reports for all details, in brief -     CT CEREBRAL PERFUSION W CONTRAST  Result Date: 07/04/2020 CLINICAL DATA:  Aphasia.  Code stroke. EXAM: CT ANGIOGRAPHY HEAD AND NECK CT PERFUSION BRAIN TECHNIQUE: Multidetector CT imaging of the head and neck was performed using the standard protocol during bolus administration of  intravenous contrast. Multiplanar CT image reconstructions and MIPs were obtained to evaluate the vascular anatomy. Carotid stenosis measurements (when applicable) are obtained utilizing NASCET criteria, using the distal internal carotid diameter as the denominator. Multiphase CT imaging of the brain was performed following IV bolus contrast injection. Subsequent parametric perfusion maps were calculated using RAPID software. CONTRAST:  8mL OMNIPAQUE IOHEXOL 350 MG/ML SOLN COMPARISON:  Noncontrast head CT performed earlier the same day 07/04/2020. FINDINGS: CTA NECK FINDINGS Aortic arch: Standard aortic branching. Atherosclerotic plaque within the visualized aortic arch and proximal major branch vessels of the neck. No hemodynamically significant innominate or proximal subclavian artery stenosis. Right carotid system: CCA and ICA patent within the neck without significant stenosis (50% or greater). Mild mixed plaque within the carotid bifurcation Left carotid system: CCA and ICA patent within the neck without significant stenosis (50% or greater). Minimal mixed plaque within the CCA and carotid bifurcation. Vertebral arteries: Codominant patent within the neck bilaterally. Moderate atherosclerotic narrowing at the origin of the left vertebral artery. Skeleton: No acute bony abnormality Other neck: No neck mass or cervical lymphadenopathy. Subcentimeter right thyroid lobe nodule not meeting consensus criteria for ultrasound follow-up. Upper chest: No consolidation within the imaged lung apices Review of the MIP images confirms the above findings CTA HEAD FINDINGS Anterior circulation: The intracranial internal carotid arteries are patent. Calcified plaque within both vessels. Moderate stenosis of the supraclinoid left ICA. Otherwise, no more than mild narrowing of these vessels. The M1 middle cerebral arteries are patent. There is a high-grade focal stenosis within the mid M1 left MCA. Atherosclerotic irregularity  of the M2 and more distal MCA branch vessels bilaterally. Most notably, there is a high-grade focal stenosis within a proximal M2 left MCA branch vessel (series 9, image 28). Additionally, there is a high-grade stenosis within a mid M2 MCA branch vessel on the right (series 9, image 15). No proximal branch occlusion is identified. The anterior cerebral arteries are patent. There is atherosclerotic irregularity of both anterior cerebral arteries. Most notably, there is a high-grade focal stenosis within the proximal A2 right anterior cerebral artery (series 4, image 92). Additionally, there is a high-grade focal stenosis within the left anterior cerebral artery at the A2/A3 junction. No intracranial aneurysm is identified. Posterior circulation: The intracranial vertebral arteries are patent. There is a moderate/severe focal stenosis within the V4 right vertebral artery at the level of the skull base. The basilar artery is patent without significant stenosis. Fetal origin left posterior cerebral artery. There  is a moderate/severe focal stenosis within the P2 left PCA (series 9, image 24). High-grade focal stenosis within the left PCA at the P3/P4 junction. Sites of moderate stenosis within the P2 right PCA. Additionally, there are sites of severe stenosis within the right PCA at the P3/P4 junction. The right posterior communicating artery is hypoplastic or absent Venous sinuses: Within limitations of contrast timing, no convincing thrombus. Anatomic variants: As described Review of the MIP images confirms the above findings CT Brain Perfusion Findings: ASPECTS: 10 CBF (<30%) Volume: 44m Perfusion (Tmax>6.0s) volume: 015m However, there is a 5 mL region within the left frontal lobe, within the left MCA vascular territory, demonstrating a Tmax greater than 4 seconds. Mismatch Volume: 33m533mnfarction Location:None identified These results were called by telephone at the time of interpretation on 07/04/2020 at 9:24 pm to  provider Dr. GolRegenia Skeeterho verbally acknowledged these results. IMPRESSION: CTA neck: 1. The bilateral common and internal carotid arteries are patent within the neck without hemodynamically significant stenosis. 2. The vertebral arteries are patent within the neck bilaterally. Moderate atherosclerotic narrowing at the origin of the left vertebral artery. CTA head: 1. No intracranial large vessel occlusion. 2. Intracranial atherosclerotic disease with multifocal stenoses, most notably as follows. 3. High-grade focal stenosis within the M1 left middle cerebral artery. 4. High-grade focal stenosis within a proximal M2 left MCA branch vessel. 5. High-grade focal stenosis within a mid M2 right MCA branch vessel. 6. High-grade focal stenosis within the proximal A2 right ACA. 7. High-grade focal stenosis within the left ACA at the A2/A3 junction. 8. Moderate stenosis of the supraclinoid left ICA. 9. Moderate/severe focal stenosis within the right vertebral artery at the level of the skull base. 10. Moderate/severe focal stenosis within the P2 left PCA. 11. High-grade focal stenosis within the left PCA at the P3/P4 junction. 12. Sites of moderate stenosis within the P2 right PCA. 13. High-grade stenoses within the right PCA at the P3/P4 junction. CT perfusion head: The perfusion software identifies no core infarct. The perfusion software identifies no critically hypoperfused parenchyma utilizing the Tmax >6 seconds threshold. However, there is a 5 mL region within the left frontal lobe demonstrating a Tmax >4 seconds within the left MCA vascular territory. Electronically Signed   By: KylKellie Simmering   On: 07/04/2020 21:24   DG Chest Portable 1 View  Result Date: 07/04/2020 CLINICAL DATA:  Altered mental status EXAM: PORTABLE CHEST 1 VIEW COMPARISON:  05/18/2020 FINDINGS: Cardiac shadow is stable. Aortic calcifications are seen. Left chest wall port is noted and stable. The lungs are well aerated without focal  infiltrate or sizable effusion. Chronic interstitial changes are again seen. No new focal abnormality is noted. IMPRESSION: No acute abnormality noted. Electronically Signed   By: MarInez CatalinaD.   On: 07/04/2020 21:14   ECHOCARDIOGRAM COMPLETE  Result Date: 07/05/2020    ECHOCARDIOGRAM REPORT   Patient Name:   Gabrielle SALMONSte of Exam: 07/05/2020 Medical Rec #:  [id number]    Height:       62.0 in Accession #:    [id number]   Weight:       131.4 lb Date of Birth:  10/16/11/1947   BSA:          1.599 m Patient Age:    [age]ars        BP:           131/52 mmHg Patient Gender: F  HR:           94 bpm. Exam Location:  [facility] Procedure: 2D Echo, Cardiac Doppler and Color Doppler Indications:    CVA  History:        Patient has prior history of Echocardiogram examinations, most                 recent 04/07/2020. Stroke; Risk Factors:Hypertension and                 Dyslipidemia. Colon cancer, Chemo.  Sonographer:    Dustin Flock RDCS Referring Phys: [id number] OLADAPO ADEFESO IMPRESSIONS  1. Left ventricular ejection fraction, by estimation, is 60 to 65%. The left ventricle has normal function. The left ventricle has no regional wall motion abnormalities. Left ventricular diastolic parameters are consistent with Grade I diastolic dysfunction (impaired relaxation).  2. Right ventricular systolic function is normal. The right ventricular size is normal.  3. Left atrial size was moderately dilated.  4. The mitral valve is normal in structure. No evidence of mitral valve regurgitation. No evidence of mitral stenosis.  5. The aortic valve is normal in structure. Aortic valve regurgitation is trivial. No aortic stenosis is present.  6. The inferior vena cava is normal in size with greater than 50% respiratory variability, suggesting right atrial pressure of 3 mmHg. Conclusion(s)/Recommendation(s): No intracardiac source of embolism detected on this transthoracic study. A transesophageal  echocardiogram is recommended to exclude cardiac source of embolism if clinically indicated. FINDINGS  Left Ventricle: Left ventricular ejection fraction, by estimation, is 60 to 65%. The left ventricle has normal function. The left ventricle has no regional wall motion abnormalities. The left ventricular internal cavity size was normal in size. There is  no left ventricular hypertrophy. Left ventricular diastolic parameters are consistent with Grade I diastolic dysfunction (impaired relaxation). Right Ventricle: The right ventricular size is normal. No increase in right ventricular wall thickness. Right ventricular systolic function is normal. Left Atrium: Left atrial size was moderately dilated. Right Atrium: Right atrial size was normal in size. Pericardium: There is no evidence of pericardial effusion. Mitral Valve: The mitral valve is normal in structure. Normal mobility of the mitral valve leaflets. No evidence of mitral valve regurgitation. No evidence of mitral valve stenosis. Tricuspid Valve: The tricuspid valve is normal in structure. Tricuspid valve regurgitation is not demonstrated. No evidence of tricuspid stenosis. Aortic Valve: The aortic valve is normal in structure. Aortic valve regurgitation is trivial. No aortic stenosis is present. Aortic valve peak gradient measures 13.2 mmHg. Pulmonic Valve: The pulmonic valve was normal in structure. Pulmonic valve regurgitation is not visualized. No evidence of pulmonic stenosis. Aorta: The aortic root is normal in size and structure. Venous: The inferior vena cava is normal in size with greater than 50% respiratory variability, suggesting right atrial pressure of 3 mmHg. IAS/Shunts: No atrial level shunt detected by color flow Doppler.  LEFT VENTRICLE PLAX 2D LVIDd:         3.75 cm  Diastology LVIDs:         2.35 cm  LV e' lateral:   8.38 cm/s LV PW:         1.17 cm  LV E/e' lateral: 10.9 LV IVS:        1.08 cm  LV e' medial:    5.11 cm/s LVOT diam:     1.90  cm  LV E/e' medial:  17.9 LV SV:         46 LV SV Index:  29 LVOT Area:     2.84 cm  RIGHT VENTRICLE RV Basal diam:  3.45 cm RV S prime:     18.80 cm/s TAPSE (M-mode): 2.9 cm LEFT ATRIUM             Index       RIGHT ATRIUM           Index LA diam:        3.30 cm 2.06 cm/m  RA Area:     13.50 cm LA Vol (A2C):   74.4 ml 46.53 ml/m RA Volume:   33.00 ml  20.64 ml/m LA Vol (A4C):   59.6 ml 37.27 ml/m LA Biplane Vol: 67.0 ml 41.90 ml/m  AORTIC VALVE AV Area (Vmax): 1.79 cm AV Vmax:        182.00 cm/s AV Peak Grad:   13.2 mmHg LVOT Vmax:      115.00 cm/s LVOT Vmean:     68.500 cm/s LVOT VTI:       0.164 m  AORTA Ao Root diam: 2.70 cm MITRAL VALVE MV Area (PHT): 5.20 cm    SHUNTS MV Decel Time: 146 msec    Systemic VTI:  0.16 m MV E velocity: 91.70 cm/s  Systemic Diam: 1.90 cm MV A velocity: 98.10 cm/s MV E/A ratio:  0.93 Candee Furbish MD Electronically signed by Candee Furbish MD Signature Date/Time: 07/05/2020/11:05:51 AM    Final    Intravitreal Injection, Pharmacologic Agent - OD - Right Eye  Result Date: 06/28/2020 Time Out 06/25/2020. 8:36 AM. Confirmed correct patient, procedure, site, and patient consented. Anesthesia Topical anesthesia was used. Anesthetic medications included Lidocaine 2%, Proparacaine 0.5%. Procedure Preparation included 5% betadine to ocular surface, eyelid speculum. A (33g) needle was used. Injection: 2 mg aflibercept Alfonse Flavors) SOLN   NDC: A3590391, Lot: [id number], Expiration date: 09/04/2020   Route: Intravitreal, Site: Right Eye, Waste: 0.05 mg Post-op Post injection exam found visual acuity of at least counting fingers. The patient tolerated the procedure well. There were no complications. The patient received written and verbal post procedure care education.   Intravitreal Injection, Pharmacologic Agent - OS - Left Eye  Result Date: 06/28/2020 Time Out 06/25/2020. 8:02 AM. Confirmed correct patient, procedure, site, and patient consented. Anesthesia Topical anesthesia was used.  Anesthetic medications included Lidocaine 2%, Proparacaine 0.5%. Procedure Preparation included 5% betadine to ocular surface, eyelid speculum. A (33g) needle was used. Injection: 2 mg aflibercept Alfonse Flavors) SOLN   NDC: A3590391, Lot: [id number], Expiration date: 09/04/2020   Route: Intravitreal, Site: Left Eye, Waste: 0.05 mg Post-op Post injection exam found visual acuity of at least counting fingers. The patient tolerated the procedure well. There were no complications. The patient received written and verbal post procedure care education.   OCT, Retina - OU - Both Eyes  Result Date: 06/28/2020 Right Eye Quality was good. Central Foveal Thickness: 520. Progression has worsened. Findings include pigment epithelial detachment, epiretinal membrane, subretinal hyper-reflective material, retinal drusen , abnormal foveal contour, no SRF, intraretinal fluid, disciform scar, outer retinal tubulation, outer retinal atrophy (Stable sub-retinal scar, interval increase in IRF overlying SRHM). Left Eye Quality was good. Central Foveal Thickness: 349. Progression has been stable. Findings include subretinal hyper-reflective material, pigment epithelial detachment, intraretinal fluid, retinal drusen , abnormal foveal contour, no SRF, outer retinal atrophy (persistent IRF overlying SRHM/PED). Notes *Images captured and stored on drive Diagnosis / Impression: Exudative ARMD OU OD: Stable sub-retinal scar, interval increase in IRF overlying SRHM OS: tr persistent IRF overlying SRHM/PED Clinical management: See  below Abbreviations: NFP - Normal foveal profile. CME - cystoid macular edema. PED - pigment epithelial detachment. IRF - intraretinal fluid. SRF - subretinal fluid. EZ - ellipsoid zone. ERM - epiretinal membrane. ORA - outer retinal atrophy. ORT - outer retinal tubulation. SRHM - subretinal hyper-reflective material   CT HEAD CODE STROKE WO CONTRAST  Result Date: 07/04/2020 CLINICAL DATA:  Neuro deficit, acute,  stroke suspected. Additional provided: Aphasia, last known well 3:30 p.m., neighbor stated when they went to check on patient she could not speak. EXAM: CT HEAD WITHOUT CONTRAST TECHNIQUE: Contiguous axial images were obtained from the base of the skull through the vertex without intravenous contrast. COMPARISON:  No pertinent prior exams are available for comparison. FINDINGS: Brain: Cerebral volume is normal for age. Minimal ill-defined hypoattenuation within the cerebral white matter is nonspecific, but consistent with chronic small vessel ischemic disease. There is no acute intracranial hemorrhage. No demarcated cortical infarct is identified. No extra-axial fluid collection. No evidence of intracranial mass. No midline shift. Vascular: No hyperdense vessel Skull: Normal. Negative for fracture or focal lesion. Sinuses/Orbits: Visualized orbits show no acute finding. Mild ethmoid sinus mucosal thickening. No significant mastoid effusion at the imaged levels ASPECTS [facility] Stroke Program Early CT Score) - Ganglionic level infarction (caudate, lentiform nuclei, internal capsule, insula, M1-M3 cortex): 7 - Supraganglionic infarction (M4-M6 cortex): 3 Total score (0-10 with 10 being normal): 10 These results were called by telephone at the time of interpretation on 07/04/2020 at 8:24 pm to provider Sherwood Gambler , who verbally acknowledged these results. IMPRESSION: 1. No CT evidence of acute intracranial abnormality.  ASPECTS is 10. 2. Mild chronic small vessel ischemic changes within the cerebral white matter. 3. Mild ethmoid sinus mucosal thickening. Electronically Signed   By: Kellie Simmering DO   On: 07/04/2020 20:24   CT ANGIO HEAD CODE STROKE  Result Date: 07/05/2020 CLINICAL DATA:  Aphasia.  Code stroke. EXAM: CT ANGIOGRAPHY HEAD AND NECK CT PERFUSION BRAIN TECHNIQUE: Multidetector CT imaging of the head and neck was performed using the standard protocol during bolus administration of intravenous contrast.  Multiplanar CT image reconstructions and MIPs were obtained to evaluate the vascular anatomy. Carotid stenosis measurements (when applicable) are obtained utilizing NASCET criteria, using the distal internal carotid diameter as the denominator. Multiphase CT imaging of the brain was performed following IV bolus contrast injection. Subsequent parametric perfusion maps were calculated using RAPID software. CONTRAST:  58m OMNIPAQUE IOHEXOL 350 MG/ML SOLN COMPARISON:  Noncontrast head CT performed earlier the same day 07/04/2020. FINDINGS: CTA NECK FINDINGS Aortic arch: Standard aortic branching. Atherosclerotic plaque within the visualized aortic arch and proximal major branch vessels of the neck. No hemodynamically significant innominate or proximal subclavian artery stenosis. Right carotid system: CCA and ICA patent within the neck without significant stenosis (50% or greater). Mild mixed plaque within the carotid bifurcation Left carotid system: CCA and ICA patent within the neck without significant stenosis (50% or greater). Minimal mixed plaque within the CCA and carotid bifurcation. Vertebral arteries: Codominant patent within the neck bilaterally. Moderate atherosclerotic narrowing at the origin of the left vertebral artery. Skeleton: No acute bony abnormality Other neck: No neck mass or cervical lymphadenopathy. Subcentimeter right thyroid lobe nodule not meeting consensus criteria for ultrasound follow-up. Upper chest: No consolidation within the imaged lung apices Review of the MIP images confirms the above findings CTA HEAD FINDINGS Anterior circulation: The intracranial internal carotid arteries are patent. Calcified plaque within both vessels. Moderate stenosis of the supraclinoid left ICA. Otherwise,  no more than mild narrowing of these vessels. The M1 middle cerebral arteries are patent. There is a high-grade focal stenosis within the mid M1 left MCA. Atherosclerotic irregularity of the M2 and more  distal MCA branch vessels bilaterally. Most notably, there is a high-grade focal stenosis within a proximal M2 left MCA branch vessel (series 9, image 28). Additionally, there is a high-grade stenosis within a mid M2 MCA branch vessel on the right (series 9, image 15). No proximal branch occlusion is identified. The anterior cerebral arteries are patent. There is atherosclerotic irregularity of both anterior cerebral arteries. Most notably, there is a high-grade focal stenosis within the proximal A2 right anterior cerebral artery (series 4, image 92). Additionally, there is a high-grade focal stenosis within the left anterior cerebral artery at the A2/A3 junction. No intracranial aneurysm is identified. Posterior circulation: The intracranial vertebral arteries are patent. There is a moderate/severe focal stenosis within the V4 right vertebral artery at the level of the skull base. The basilar artery is patent without significant stenosis. Fetal origin left posterior cerebral artery. There is a moderate/severe focal stenosis within the P2 left PCA (series 9, image 24). High-grade focal stenosis within the left PCA at the P3/P4 junction. Sites of moderate stenosis within the P2 right PCA. Additionally, there are sites of severe stenosis within the right PCA at the P3/P4 junction. The right posterior communicating artery is hypoplastic or absent Venous sinuses: Within limitations of contrast timing, no convincing thrombus. Anatomic variants: As described Review of the MIP images confirms the above findings CT Brain Perfusion Findings: ASPECTS: 10 CBF (<30%) Volume: 868m Perfusion (Tmax>6.0s) volume: 066m However, there is a 5 mL region within the left frontal lobe, within the left MCA vascular territory, demonstrating a Tmax greater than 4 seconds. Mismatch Volume: 68m39mnfarction Location:None identified These results were called by telephone at the time of interpretation on 07/04/2020 at 9:24 pm to provider Dr.  GolRegenia Skeeterho verbally acknowledged these results. IMPRESSION: CTA neck: 1. The bilateral common and internal carotid arteries are patent within the neck without hemodynamically significant stenosis. 2. The vertebral arteries are patent within the neck bilaterally. Moderate atherosclerotic narrowing at the origin of the left vertebral artery. CTA head: 1. No intracranial large vessel occlusion. 2. Intracranial atherosclerotic disease with multifocal stenoses, most notably as follows. 3. High-grade focal stenosis within the M1 left middle cerebral artery. 4. High-grade focal stenosis within a proximal M2 left MCA branch vessel. 5. High-grade focal stenosis within a mid M2 right MCA branch vessel. 6. High-grade focal stenosis within the proximal A2 right ACA. 7. High-grade focal stenosis within the left ACA at the A2/A3 junction. 8. Moderate stenosis of the supraclinoid left ICA. 9. Moderate/severe focal stenosis within the right vertebral artery at the level of the skull base. 10. Moderate/severe focal stenosis within the P2 left PCA. 11. High-grade focal stenosis within the left PCA at the P3/P4 junction. 12. Sites of moderate stenosis within the P2 right PCA. 13. High-grade stenoses within the right PCA at the P3/P4 junction. CT perfusion head: The perfusion software identifies no core infarct. The perfusion software identifies no critically hypoperfused parenchyma utilizing the Tmax >6 seconds threshold. However, there is a 5 mL region within the left frontal lobe demonstrating a Tmax >4 seconds within the left MCA vascular territory. Electronically Signed   By: KylKellie Simmering   On: 07/04/2020 21:24   CT ANGIO NECK CODE STROKE  Result Date: 07/04/2020 CLINICAL DATA:  Aphasia.  Code stroke. EXAM:  CT ANGIOGRAPHY HEAD AND NECK CT PERFUSION BRAIN TECHNIQUE: Multidetector CT imaging of the head and neck was performed using the standard protocol during bolus administration of intravenous contrast. Multiplanar CT  image reconstructions and MIPs were obtained to evaluate the vascular anatomy. Carotid stenosis measurements (when applicable) are obtained utilizing NASCET criteria, using the distal internal carotid diameter as the denominator. Multiphase CT imaging of the brain was performed following IV bolus contrast injection. Subsequent parametric perfusion maps were calculated using RAPID software. CONTRAST:  63m OMNIPAQUE IOHEXOL 350 MG/ML SOLN COMPARISON:  Noncontrast head CT performed earlier the same day 07/04/2020. FINDINGS: CTA NECK FINDINGS Aortic arch: Standard aortic branching. Atherosclerotic plaque within the visualized aortic arch and proximal major branch vessels of the neck. No hemodynamically significant innominate or proximal subclavian artery stenosis. Right carotid system: CCA and ICA patent within the neck without significant stenosis (50% or greater). Mild mixed plaque within the carotid bifurcation Left carotid system: CCA and ICA patent within the neck without significant stenosis (50% or greater). Minimal mixed plaque within the CCA and carotid bifurcation. Vertebral arteries: Codominant patent within the neck bilaterally. Moderate atherosclerotic narrowing at the origin of the left vertebral artery. Skeleton: No acute bony abnormality Other neck: No neck mass or cervical lymphadenopathy. Subcentimeter right thyroid lobe nodule not meeting consensus criteria for ultrasound follow-up. Upper chest: No consolidation within the imaged lung apices Review of the MIP images confirms the above findings CTA HEAD FINDINGS Anterior circulation: The intracranial internal carotid arteries are patent. Calcified plaque within both vessels. Moderate stenosis of the supraclinoid left ICA. Otherwise, no more than mild narrowing of these vessels. The M1 middle cerebral arteries are patent. There is a high-grade focal stenosis within the mid M1 left MCA. Atherosclerotic irregularity of the M2 and more distal MCA branch  vessels bilaterally. Most notably, there is a high-grade focal stenosis within a proximal M2 left MCA branch vessel (series 9, image 28). Additionally, there is a high-grade stenosis within a mid M2 MCA branch vessel on the right (series 9, image 15). No proximal branch occlusion is identified. The anterior cerebral arteries are patent. There is atherosclerotic irregularity of both anterior cerebral arteries. Most notably, there is a high-grade focal stenosis within the proximal A2 right anterior cerebral artery (series 4, image 92). Additionally, there is a high-grade focal stenosis within the left anterior cerebral artery at the A2/A3 junction. No intracranial aneurysm is identified. Posterior circulation: The intracranial vertebral arteries are patent. There is a moderate/severe focal stenosis within the V4 right vertebral artery at the level of the skull base. The basilar artery is patent without significant stenosis. Fetal origin left posterior cerebral artery. There is a moderate/severe focal stenosis within the P2 left PCA (series 9, image 24). High-grade focal stenosis within the left PCA at the P3/P4 junction. Sites of moderate stenosis within the P2 right PCA. Additionally, there are sites of severe stenosis within the right PCA at the P3/P4 junction. The right posterior communicating artery is hypoplastic or absent Venous sinuses: Within limitations of contrast timing, no convincing thrombus. Anatomic variants: As described Review of the MIP images confirms the above findings CT Brain Perfusion Findings: ASPECTS: 10 CBF (<30%) Volume: 018mPerfusion (Tmax>6.0s) volume: 37m8mHowever, there is a 5 mL region within the left frontal lobe, within the left MCA vascular territory, demonstrating a Tmax greater than 4 seconds. Mismatch Volume: 37mL58mfarction Location:None identified These results were called by telephone at the time of interpretation on 07/04/2020 at 9:24 pm to provider  Dr. Regenia Skeeter, who verbally  acknowledged these results. IMPRESSION: CTA neck: 1. The bilateral common and internal carotid arteries are patent within the neck without hemodynamically significant stenosis. 2. The vertebral arteries are patent within the neck bilaterally. Moderate atherosclerotic narrowing at the origin of the left vertebral artery. CTA head: 1. No intracranial large vessel occlusion. 2. Intracranial atherosclerotic disease with multifocal stenoses, most notably as follows. 3. High-grade focal stenosis within the M1 left middle cerebral artery. 4. High-grade focal stenosis within a proximal M2 left MCA branch vessel. 5. High-grade focal stenosis within a mid M2 right MCA branch vessel. 6. High-grade focal stenosis within the proximal A2 right ACA. 7. High-grade focal stenosis within the left ACA at the A2/A3 junction. 8. Moderate stenosis of the supraclinoid left ICA. 9. Moderate/severe focal stenosis within the right vertebral artery at the level of the skull base. 10. Moderate/severe focal stenosis within the P2 left PCA. 11. High-grade focal stenosis within the left PCA at the P3/P4 junction. 12. Sites of moderate stenosis within the P2 right PCA. 13. High-grade stenoses within the right PCA at the P3/P4 junction. CT perfusion head: The perfusion software identifies no core infarct. The perfusion software identifies no critically hypoperfused parenchyma utilizing the Tmax >6 seconds threshold. However, there is a 5 mL region within the left frontal lobe demonstrating a Tmax >4 seconds within the left MCA vascular territory. Electronically Signed   By: Kellie Simmering DO   On: 07/04/2020 21:24    Micro Results    Recent Results (from the past 240 hour(s))  SARS Coronavirus 2 by RT PCR (hospital order, performed in [facility] hospital lab) Nasopharyngeal Nasopharyngeal Swab     Status: None   Collection Time: 07/04/20  9:39 PM   Specimen: Nasopharyngeal Swab  Result Value Ref Range Status   SARS Coronavirus 2 NEGATIVE  NEGATIVE Final    Comment: (NOTE) SARS-CoV-2 target nucleic acids are NOT DETECTED.  The SARS-CoV-2 RNA is generally detectable in upper and lower respiratory specimens during the acute phase of infection. The lowest concentration of SARS-CoV-2 viral copies this assay can detect is 250 copies / mL. A negative result does not preclude SARS-CoV-2 infection and should not be used as the sole basis for treatment or other patient management decisions.  A negative result may occur with improper specimen collection / handling, submission of specimen other than nasopharyngeal swab, presence of viral mutation(s) within the areas targeted by this assay, and inadequate number of viral copies (<250 copies / mL). A negative result must be combined with clinical observations, patient history, and epidemiological information.  Fact Sheet for Patients:   StrictlyIdeas.no  Fact Sheet for Healthcare Providers: BankingDealers.co.za  This test is not yet approved or  cleared by the [place] FDA and has been authorized for detection and/or diagnosis of SARS-CoV-2 by FDA under an Emergency Use Authorization (EUA).  This EUA will remain in effect (meaning this test can be used) for the duration of the COVID-19 declaration under Section 564(b)(1) of the Act, 21 U.S.C. section 360bbb-3(b)(1), unless the authorization is terminated or revoked sooner.  Performed at [facility], [address], [place], [place] [address]   Culture, blood (routine x 2)     Status: None (Preliminary result)   Collection Time: 07/04/20  9:57 PM   Specimen: Right Antecubital; Blood  Result Value Ref Range Status   Specimen Description RIGHT ANTECUBITAL  Final   Special Requests   Final    BOTTLES DRAWN AEROBIC AND ANAEROBIC  Blood Culture adequate volume Performed at [facility], [address], [place], [place] [address]    Culture PENDING  Incomplete   Report Status  PENDING  Incomplete  Culture, blood (routine x 2)     Status: None (Preliminary result)   Collection Time: 07/04/20 10:05 PM   Specimen: BLOOD RIGHT HAND  Result Value Ref Range Status   Specimen Description BLOOD RIGHT HAND  Final   Special Requests   Final    BOTTLES DRAWN AEROBIC ONLY Blood Culture adequate volume Performed at [facility], [address], [place], [place] [address]    Culture PENDING  Incomplete   Report Status PENDING  Incomplete       Today   Subjective    Gabrielle [name] today has no new complaints  -Patient and daughter declined further interventions requesting to leave AMA          Patient has been seen and examined prior to discharge   Objective   Blood pressure (!) 123/49, pulse 95, temperature 99.3 F (37.4 C), temperature source Rectal, resp. rate 18, SpO2 100 %.  No intake or output data in the 24 hours ending 07/05/20 1619  Exam Gen:- Awake Alert, no acute distress , speech disturbance persist HEENT:- [place].AT, No sclera icterus Neck-Supple Neck,No JVD,.  Lungs-  CTAB , good air movement bilaterally  CV- S1, S2 normal, regular Abd-  +ve B.Sounds, Abd Soft, No tenderness,    Extremity/Skin:- No  edema,   good pulses Psych-limited eval due to speech disturbance Neuro-generalized weakness, speech disturbance persist   Data Review   CBC w Diff:  Lab Results  Component Value Date   WBC 13.1 (H) 07/05/2020   HGB 9.8 (L) 07/05/2020   HGB 9.9 (L) 06/15/2020   HCT 32.2 (L) 07/05/2020   HCT 29.9 (L) 06/15/2020   PLT 171 07/05/2020   PLT 143 (L) 06/15/2020   LYMPHOPCT 4 07/04/2020   MONOPCT 4 07/04/2020   EOSPCT 0 07/04/2020   BASOPCT 0 07/04/2020    CMP:  Lab Results  Component Value Date   NA 134 (L) 07/05/2020   NA 142 06/15/2020   K 3.1 (L) 07/05/2020   CL 103 07/05/2020   CO2 24 07/05/2020   BUN 22 07/05/2020   BUN 11 06/15/2020   CREATININE 1.29 (H) 07/05/2020   PROT 5.6 (L) 07/05/2020   PROT 5.8 (L) 06/15/2020    ALBUMIN 2.5 (L) 07/05/2020   ALBUMIN 3.3 (L) 06/15/2020   BILITOT 0.5 07/05/2020   BILITOT 0.2 06/15/2020   ALKPHOS 76 07/05/2020   AST 7 (L) 07/05/2020   ALT 6 07/05/2020  .   Total Discharge time is about 33 minutes  Roxan Hockey M.D on 07/05/2020 at 4:19 PM  Go to [email or web address] -  for contact info  [facility] - Office  [phone]

## 2020-07-05 NOTE — ED Notes (Signed)
Hospitalist at bedside at this time 

## 2020-07-05 NOTE — Progress Notes (Signed)
°  Patient seen and evaluated earlier in the day in ED room 18   -Got a call from patient's RN Gerda Diss that patient and daughter had further questions and were expressing desire to possibly leave AMA and go to [facility]  I called and discussed patient's care  with pt and daughter--- plan of care discussed,  questions answered -After extensive conversation with patient and daughter--they expressed concerns about their previous hospital stay and previous experience within the [facility] system  -- -Patient seen and reevaluated again this afternoon--- patient again reports to me that she intends to leave AMA and go to [facility]  -Patient and patient's daughter both tell me that patient had Requested not to be brought to [facility] initially, she wanted to go to [facility] , but [facility] brought her here to [facility] for code stroke, rather than taking her  [facility] as she has requested initially -- -Copies of patient's medical records including imaging and lab reports printed and given to patient---   --Awaiting final decision from patient and daughter regarding possible transfer to [facility] for brain MRI, in-house neurology evaluation and further management Versus leaving AMA and going to [facility]

## 2020-07-05 NOTE — TOC Transition Note (Signed)
Transition of Care [facility]) - CM/SW Discharge Note   Patient Details  Name: Gabrielle [name] MRN: [id number] Date of Birth: [birth year]  Transition of Care [facility]) CM/SW Contact:  Natasha Bence, LCSW Phone Number: 07/05/2020, 3:26 PM   Clinical Narrative:    CSW contacted patient's family for assessment and to inquire about potential placement. Patient's family reported that they will be taking their mother to [facility] today. Patient's family expressed that they believed that location would be more equipped to address patient's needs. CSW discussed with patient's family that the process of placement to SNF can be done from [facility] since patient will not be discharging from [facility] to a lower level of care. TOC signing off.       Readmission Risk Interventions No flowsheet data found.

## 2020-07-06 DIAGNOSIS — R471 Dysarthria and anarthria: Secondary | ICD-10-CM | POA: Diagnosis not present

## 2020-07-06 DIAGNOSIS — I517 Cardiomegaly: Secondary | ICD-10-CM | POA: Diagnosis not present

## 2020-07-06 DIAGNOSIS — R000 Tachycardia, unspecified: Secondary | ICD-10-CM | POA: Diagnosis not present

## 2020-07-07 DIAGNOSIS — A0472 Enterocolitis due to Clostridium difficile, not specified as recurrent: Secondary | ICD-10-CM | POA: Diagnosis not present

## 2020-07-07 DIAGNOSIS — I4891 Unspecified atrial fibrillation: Secondary | ICD-10-CM | POA: Diagnosis not present

## 2020-07-07 DIAGNOSIS — N390 Urinary tract infection, site not specified: Secondary | ICD-10-CM | POA: Diagnosis not present

## 2020-07-07 DIAGNOSIS — G459 Transient cerebral ischemic attack, unspecified: Secondary | ICD-10-CM | POA: Diagnosis not present

## 2020-07-07 LAB — URINE CULTURE: Culture: >=100000 — AB

## 2020-07-08 ENCOUNTER — Telehealth: Payer: Self-pay

## 2020-07-08 DIAGNOSIS — E119 Type 2 diabetes mellitus without complications: Secondary | ICD-10-CM | POA: Insufficient documentation

## 2020-07-08 DIAGNOSIS — G459 Transient cerebral ischemic attack, unspecified: Secondary | ICD-10-CM | POA: Diagnosis not present

## 2020-07-08 DIAGNOSIS — M48061 Spinal stenosis, lumbar region without neurogenic claudication: Secondary | ICD-10-CM | POA: Insufficient documentation

## 2020-07-08 DIAGNOSIS — N390 Urinary tract infection, site not specified: Secondary | ICD-10-CM | POA: Diagnosis not present

## 2020-07-08 DIAGNOSIS — I4891 Unspecified atrial fibrillation: Secondary | ICD-10-CM | POA: Diagnosis not present

## 2020-07-08 DIAGNOSIS — A0472 Enterocolitis due to Clostridium difficile, not specified as recurrent: Secondary | ICD-10-CM | POA: Diagnosis not present

## 2020-07-08 NOTE — Telephone Encounter (Signed)
+   [facility] ED 07/05/20  PT at [facility] and treated there per Remus Blake D

## 2020-07-09 ENCOUNTER — Ambulatory Visit (INDEPENDENT_AMBULATORY_CARE_PROVIDER_SITE_OTHER): Payer: Medicare HMO | Admitting: Gastroenterology

## 2020-07-09 ENCOUNTER — Telehealth: Payer: Self-pay | Admitting: *Deleted

## 2020-07-09 DIAGNOSIS — I480 Paroxysmal atrial fibrillation: Secondary | ICD-10-CM | POA: Diagnosis not present

## 2020-07-09 DIAGNOSIS — N390 Urinary tract infection, site not specified: Secondary | ICD-10-CM | POA: Diagnosis not present

## 2020-07-09 DIAGNOSIS — I4891 Unspecified atrial fibrillation: Secondary | ICD-10-CM | POA: Diagnosis not present

## 2020-07-09 DIAGNOSIS — R5381 Other malaise: Secondary | ICD-10-CM | POA: Diagnosis not present

## 2020-07-09 DIAGNOSIS — I69898 Other sequelae of other cerebrovascular disease: Secondary | ICD-10-CM | POA: Diagnosis not present

## 2020-07-09 DIAGNOSIS — E119 Type 2 diabetes mellitus without complications: Secondary | ICD-10-CM | POA: Diagnosis not present

## 2020-07-09 DIAGNOSIS — E785 Hyperlipidemia, unspecified: Secondary | ICD-10-CM | POA: Diagnosis not present

## 2020-07-09 DIAGNOSIS — Z85038 Personal history of other malignant neoplasm of large intestine: Secondary | ICD-10-CM | POA: Diagnosis not present

## 2020-07-09 DIAGNOSIS — M6281 Muscle weakness (generalized): Secondary | ICD-10-CM | POA: Diagnosis not present

## 2020-07-09 DIAGNOSIS — I517 Cardiomegaly: Secondary | ICD-10-CM | POA: Diagnosis not present

## 2020-07-09 LAB — CULTURE, BLOOD (ROUTINE X 2)
Culture: NO GROWTH
Culture: NO GROWTH
Special Requests: ADEQUATE
Special Requests: ADEQUATE

## 2020-07-09 NOTE — Telephone Encounter (Signed)
   Transitional Care Note Hospital Discharge to [facility] 07/09/2020 Reviewed By: Georga Kaufmann, LPN  Gabrielle [name] was admitted to [facility] on 07/05/20 with atrial fib.  she was transferred from [facility] to  ([facility]) on 07/08/20.  she will be followed by the facility's staff physicians while there and will need to follow-up with Janora Norlander, DO (PCP) within two weeks of discharge from SNF for Transitional Care Management.   Plan . Clinical staff will monitor discharge reports and contact patient to initiate TCM within 2 business days of SNF discharge.  . After discharge CCM team will collaborate with PCP regarding potential CCM eligibility and the benefits CCM services provide the patient. The primary goal would be to increase patient self-management of chronic medical conditions resulting in improved patient health outcomes, decreased exacerbations of chronic medical conditions, and decreased ED visits/hospital admissions.

## 2020-07-13 ENCOUNTER — Encounter (HOSPITAL_COMMUNITY): Payer: Self-pay

## 2020-07-13 DIAGNOSIS — N390 Urinary tract infection, site not specified: Secondary | ICD-10-CM | POA: Diagnosis not present

## 2020-07-13 DIAGNOSIS — I480 Paroxysmal atrial fibrillation: Secondary | ICD-10-CM | POA: Diagnosis not present

## 2020-07-13 DIAGNOSIS — M6281 Muscle weakness (generalized): Secondary | ICD-10-CM | POA: Diagnosis not present

## 2020-07-13 DIAGNOSIS — E785 Hyperlipidemia, unspecified: Secondary | ICD-10-CM | POA: Diagnosis not present

## 2020-07-13 DIAGNOSIS — R5381 Other malaise: Secondary | ICD-10-CM | POA: Diagnosis not present

## 2020-07-13 DIAGNOSIS — Z85038 Personal history of other malignant neoplasm of large intestine: Secondary | ICD-10-CM | POA: Diagnosis not present

## 2020-07-13 DIAGNOSIS — I69898 Other sequelae of other cerebrovascular disease: Secondary | ICD-10-CM | POA: Diagnosis not present

## 2020-07-13 DIAGNOSIS — E119 Type 2 diabetes mellitus without complications: Secondary | ICD-10-CM | POA: Diagnosis not present

## 2020-07-13 NOTE — Progress Notes (Signed)
I spoke with the patient's daughter, Ashok Norris, today. Ashok Norris reports that the patient has decided not to proceed with future chemotherapy treatments. Dr. Delton Coombes aware of patient's wishes/ Dr. Delton Coombes asks that the patient return in 2 weeks for follow-up. [name] aware of appointment on 07/28/2020.

## 2020-07-14 ENCOUNTER — Other Ambulatory Visit (HOSPITAL_COMMUNITY): Payer: Medicare HMO

## 2020-07-14 ENCOUNTER — Ambulatory Visit (HOSPITAL_COMMUNITY): Payer: Medicare HMO | Admitting: Hematology

## 2020-07-14 ENCOUNTER — Ambulatory Visit (HOSPITAL_COMMUNITY): Payer: Medicare HMO

## 2020-07-15 ENCOUNTER — Other Ambulatory Visit: Payer: Self-pay

## 2020-07-15 ENCOUNTER — Encounter: Payer: Self-pay | Admitting: *Deleted

## 2020-07-15 ENCOUNTER — Encounter: Payer: Self-pay | Admitting: Family Medicine

## 2020-07-15 ENCOUNTER — Ambulatory Visit (INDEPENDENT_AMBULATORY_CARE_PROVIDER_SITE_OTHER): Payer: Medicare HMO | Admitting: Family Medicine

## 2020-07-15 VITALS — BP 110/65 | HR 78 | Temp 96.9°F | Ht 62.0 in | Wt 128.4 lb

## 2020-07-15 DIAGNOSIS — Z95828 Presence of other vascular implants and grafts: Secondary | ICD-10-CM

## 2020-07-15 DIAGNOSIS — A498 Other bacterial infections of unspecified site: Secondary | ICD-10-CM | POA: Diagnosis not present

## 2020-07-15 DIAGNOSIS — I4891 Unspecified atrial fibrillation: Secondary | ICD-10-CM | POA: Diagnosis not present

## 2020-07-15 DIAGNOSIS — Z8673 Personal history of transient ischemic attack (TIA), and cerebral infarction without residual deficits: Secondary | ICD-10-CM | POA: Diagnosis not present

## 2020-07-15 DIAGNOSIS — Z87440 Personal history of urinary (tract) infections: Secondary | ICD-10-CM

## 2020-07-15 MED ORDER — FAMOTIDINE 20 MG PO TABS
20.0000 mg | ORAL_TABLET | Freq: Two times a day (BID) | ORAL | 2 refills | Status: DC | PRN
Start: 2020-07-15 — End: 2020-11-05

## 2020-07-15 NOTE — Progress Notes (Signed)
pt came in today for hosp/ rehab f/u with PCP =Gabrielle Blanch Media, FNP. It was noticed that the pts port was accessed and she was unsure as to why - pt states that it hasn't been touched since she was in the hospital (date on banadage was 07/05/2020). [name] and I discussed with Dr Lajuana Ripple here at [facility] and we also called the oncology nurse team at [facility] to clarify that this did need to come out. Pt has been home since Monday and the port/line has not been flushed. With approval I removed the access line from port with no complications. Area clean and dry and bandage applied to area.  Done at 335pm 07/15/2020- [name], lpn

## 2020-07-15 NOTE — Progress Notes (Signed)
Assessment & Plan:  1-2. History of TIA (transient ischemic attack)/Atrial fibrillation, unspecified type [facility]) - Encouraged patient to keep follow-up appointments with cardiology and neurology. Discussed risks of untreated A-Fib and the potential for a stroke. Also discussed she needs to participate in her medical decisions instead of letting her daughter make all the decisions for her.  - BMP8+EGFR - CBC with Differential/Platelet  3. Clostridioides difficile infection - Uncertain if patient has taken antibiotics to treat or not. We have requested records from [facility] to review.   4. History of UTI - Treated in the hospital.   5. Port-A-Cath in place - Port de-accessed in office today by LPN.    Follow up plan: Return for Needs routine follow-up with PCP scheduled (due now).  Hendricks Limes, MSN, APRN, FNP-C [facility]  Subjective:   Patient ID: Gabrielle [name], female    DOB: 14-Mar-1946, 74 y.o.   MRN: [id number]  HPI: Gabrielle [name] is a 74 y.o. female presenting on 07/15/2020 for Hospitalization Follow-up (wants to discuss port they put on here )  Patient is here for a follow-up from a rehabilitation stay at [facility] from 07/08/2020 through 07/13/2020. Patient does not know why she was sent there or what happened while she was there. I do not have records from the facility to review.   Upon chart review it appears patient was at [facility] from 07/05/2020 - 07/08/2020 due to a concern for stroke. She was discharged to SNF. She was diagnosed with TIA (secondary to A-Fib), C.diff, and UTI. Patient was started on ASA and continued on atorvastatin with plans for possible LAA occlusion device in the future for stroke prevention. She was to follow-up with neurology and cardiology. She is not taking the ASA as she reports her daughter doesn't want her to. Patient has an appointment with cardiology on 07/20/2020 and neurology on 08/06/2020.  Patient was discharged with Vancomycin 125 mg PO QID x10 days. She does not know if she took this medication or not. It is not in the bag she brought with her today. Her UTI was treated with fosfomycin in the hospital.   Patient has a port-a-cath which is current accessed. She does not know why but states nobody has been giving her anything through it at home or while she was at the facility. She believes it was accessed while she was in the hospital, but is not sure.    ROS: Negative unless specifically indicated above in HPI.   Relevant past medical history reviewed and updated as indicated.   Allergies and medications reviewed and updated.   Current Outpatient Medications:  .  alendronate (FOSAMAX) 70 MG tablet, Take 1 tablet (70 mg total) by mouth every 7 (seven) days. Take with a full glass of water on an empty stomach., Disp: 4 tablet, Rfl: 11 .  amLODipine (NORVASC) 5 MG tablet, Take 1 tablet (5 mg total) by mouth daily., Disp: 30 tablet, Rfl: 1 .  atorvastatin (LIPITOR) 20 MG tablet, Take 1 tablet (20 mg total) by mouth daily., Disp: 90 tablet, Rfl: 3 .  diphenoxylate-atropine (LOMOTIL) 2.5-0.025 MG tablet, Take 1 tablet by mouth 4 (four) times daily as needed for diarrhea or loose stools., Disp: , Rfl:  .  ferrous sulfate 325 (65 FE) MG EC tablet, Take 1 tablet (325 mg total) by mouth 2 (two) times daily with a meal., Disp: 180 tablet, Rfl: 3 .  losartan-hydrochlorothiazide (HYZAAR) 100-25 MG tablet, Take 1 tablet  by mouth daily., Disp: , Rfl:  .  potassium chloride SA (KLOR-CON) 20 MEQ tablet, Take 1 tablet (20 mEq total) by mouth daily., Disp: 90 tablet, Rfl: 1 .  cholecalciferol (VITAMIN D3) 25 MCG (1000 UT) tablet, Take 1,000 Units by mouth daily., Disp: , Rfl:  .  CRANBERRY FRUIT PO, Take 1 tablet by mouth daily., Disp: , Rfl:  .  diclofenac Sodium (VOLTAREN) 1 % GEL, Apply 2 g topically 4 (four) times daily.  (Patient not taking: Reported on 06/30/2020), Disp: , Rfl:  .   dorzolamide-timolol (COSOPT) 22.3-6.8 MG/ML ophthalmic solution, PLACE 1 DROP INTO BOTH EYES 2 TIMES A DAY (Patient not taking: Reported on 07/15/2020), Disp: 10 mL, Rfl: 0 .  fluorouracil CALGB 29937 in sodium chloride 0.9 % 150 mL, Inject into the vein over 48 hr. (Patient not taking: Reported on 07/15/2020), Disp: , Rfl:  .  FLUOROURACIL IV, Inject into the vein every 14 (fourteen) days. (Patient not taking: Reported on 07/15/2020), Disp: , Rfl:  .  LEUCOVORIN CALCIUM IV, Inject into the vein every 14 (fourteen) days. (Patient not taking: Reported on 07/15/2020), Disp: , Rfl:  .  lidocaine-prilocaine (EMLA) cream, Apply a small amount to port a cath site and cover with plastic wrap 1 hour prior to chemotherapy appointments (Patient not taking: Reported on 06/22/2020), Disp: 30 g, Rfl: 0 .  OXALIPLATIN IV, Inject into the vein every 14 (fourteen) days. (Patient not taking: Reported on 07/15/2020), Disp: , Rfl:  .  prochlorperazine (COMPAZINE) 10 MG tablet, Take 1 tablet (10 mg total) by mouth every 6 (six) hours as needed for nausea or vomiting. (Patient not taking: Reported on 07/15/2020), Disp: 30 tablet, Rfl: 0  Allergies  Allergen Reactions  . Lisinopril Cough    Objective:   BP 110/65   Pulse 78   Temp (!) 96.9 F (36.1 C) (Temporal)   Ht 5' 2" (1.575 m)   Wt 128 lb 6.4 oz (58.2 kg)   SpO2 100%   BMI 23.48 kg/m    Physical Exam Vitals reviewed.  Constitutional:      General: She is not in acute distress.    Appearance: Normal appearance. She is normal weight. She is not ill-appearing, toxic-appearing or diaphoretic.  HENT:     Head: Normocephalic and atraumatic.  Eyes:     General: No scleral icterus.       Right eye: No discharge.        Left eye: No discharge.     Conjunctiva/sclera: Conjunctivae normal.  Cardiovascular:     Rate and Rhythm: Normal rate. Rhythm irregular.     Heart sounds: Normal heart sounds. No murmur heard.  No friction rub. No gallop.      Comments:  Port-a-cath in place currently accessed.  Pulmonary:     Effort: Pulmonary effort is normal. No respiratory distress.     Breath sounds: Normal breath sounds. No stridor. No wheezing, rhonchi or rales.  Musculoskeletal:        General: Normal range of motion.     Cervical back: Normal range of motion.  Skin:    General: Skin is warm and dry.     Capillary Refill: Capillary refill takes less than 2 seconds.  Neurological:     General: No focal deficit present.     Mental Status: She is alert and oriented to person, place, and time. Mental status is at baseline.  Psychiatric:        Mood and Affect: Mood normal.  Behavior: Behavior normal.        Thought Content: Thought content normal.        Judgment: Judgment normal.

## 2020-07-16 ENCOUNTER — Encounter (HOSPITAL_COMMUNITY): Payer: Medicare HMO

## 2020-07-16 ENCOUNTER — Other Ambulatory Visit: Payer: Self-pay

## 2020-07-16 LAB — CBC WITH DIFFERENTIAL/PLATELET
Basophils Absolute: 0 10*3/uL (ref 0.0–0.2)
Basos: 1 %
EOS (ABSOLUTE): 0.1 10*3/uL (ref 0.0–0.4)
Eos: 2 %
Hematocrit: 34.2 % (ref 34.0–46.6)
Hemoglobin: 11 g/dL — ABNORMAL LOW (ref 11.1–15.9)
Immature Grans (Abs): 0.1 10*3/uL (ref 0.0–0.1)
Immature Granulocytes: 1 %
Lymphocytes Absolute: 1.6 10*3/uL (ref 0.7–3.1)
Lymphs: 22 %
MCH: 30.2 pg (ref 26.6–33.0)
MCHC: 32.2 g/dL (ref 31.5–35.7)
MCV: 94 fL (ref 79–97)
Monocytes Absolute: 0.8 10*3/uL (ref 0.1–0.9)
Monocytes: 10 %
Neutrophils Absolute: 4.7 10*3/uL (ref 1.4–7.0)
Neutrophils: 64 %
Platelets: 335 10*3/uL (ref 150–450)
RBC: 3.64 x10E6/uL — ABNORMAL LOW (ref 3.77–5.28)
RDW: 15.1 % (ref 11.7–15.4)
WBC: 7.3 10*3/uL (ref 3.4–10.8)

## 2020-07-16 LAB — BMP8+EGFR
BUN/Creatinine Ratio: 14 (ref 12–28)
BUN: 12 mg/dL (ref 8–27)
CO2: 25 mmol/L (ref 20–29)
Calcium: 8.2 mg/dL — ABNORMAL LOW (ref 8.7–10.3)
Chloride: 101 mmol/L (ref 96–106)
Creatinine, Ser: 0.87 mg/dL (ref 0.57–1.00)
GFR calc Af Amer: 76 mL/min/{1.73_m2} (ref >59)
GFR calc non Af Amer: 66 mL/min/{1.73_m2} (ref >59)
Glucose: 91 mg/dL (ref 65–99)
Potassium: 4.3 mmol/L (ref 3.5–5.2)
Sodium: 137 mmol/L (ref 134–144)

## 2020-07-16 NOTE — Patient Outreach (Signed)
[facility]) Care Management  07/16/2020  Gabrielle [name] 1946/07/27 [id number]     Transition of Care Referral  Referral Date: 07/16/2020 Referral Source: [facility] Discharge Report Date of Discharge: 07/13/2020 Facility: [place]: [employer]    Outreach attempt # 1 to patient. Spoke with patient who reports she is doing and feeling well since returning home. She denies any acute issues or concerns at present.She voices she has supportive family to assist her. She completed PCP follow up appt on yesterday. She confirms she has all her meds and no issues at present. She did not wish to complete med review. [facility] services reviewed and discussed. She politely declined services and reported she would call if she needed anything.    Plan: RN CM will close case at this time.    Enzo Montgomery, RN,BSN,CCM [facility] Telephonic Care Management Coordinator Direct Phone: [phone] Toll Free: [phone] Fax: [phone]

## 2020-07-20 DIAGNOSIS — C189 Malignant neoplasm of colon, unspecified: Secondary | ICD-10-CM | POA: Diagnosis not present

## 2020-07-20 DIAGNOSIS — R9431 Abnormal electrocardiogram [ECG] [EKG]: Secondary | ICD-10-CM | POA: Diagnosis not present

## 2020-07-20 DIAGNOSIS — Z8673 Personal history of transient ischemic attack (TIA), and cerebral infarction without residual deficits: Secondary | ICD-10-CM | POA: Diagnosis not present

## 2020-07-20 DIAGNOSIS — I4819 Other persistent atrial fibrillation: Secondary | ICD-10-CM | POA: Diagnosis not present

## 2020-07-21 DIAGNOSIS — M48061 Spinal stenosis, lumbar region without neurogenic claudication: Secondary | ICD-10-CM | POA: Diagnosis not present

## 2020-07-21 DIAGNOSIS — M6281 Muscle weakness (generalized): Secondary | ICD-10-CM | POA: Diagnosis not present

## 2020-07-21 DIAGNOSIS — I69898 Other sequelae of other cerebrovascular disease: Secondary | ICD-10-CM | POA: Diagnosis not present

## 2020-07-21 DIAGNOSIS — Z85038 Personal history of other malignant neoplasm of large intestine: Secondary | ICD-10-CM | POA: Diagnosis not present

## 2020-07-22 ENCOUNTER — Other Ambulatory Visit: Payer: Self-pay

## 2020-07-22 ENCOUNTER — Ambulatory Visit (INDEPENDENT_AMBULATORY_CARE_PROVIDER_SITE_OTHER): Payer: Medicare HMO | Admitting: Family Medicine

## 2020-07-22 ENCOUNTER — Encounter: Payer: Self-pay | Admitting: Family Medicine

## 2020-07-22 VITALS — BP 129/64 | HR 75 | Temp 97.2°F | Ht 62.0 in | Wt 121.0 lb

## 2020-07-22 DIAGNOSIS — R21 Rash and other nonspecific skin eruption: Secondary | ICD-10-CM | POA: Diagnosis not present

## 2020-07-22 DIAGNOSIS — R490 Dysphonia: Secondary | ICD-10-CM | POA: Diagnosis not present

## 2020-07-22 DIAGNOSIS — R9431 Abnormal electrocardiogram [ECG] [EKG]: Secondary | ICD-10-CM | POA: Diagnosis not present

## 2020-07-22 DIAGNOSIS — R197 Diarrhea, unspecified: Secondary | ICD-10-CM | POA: Diagnosis not present

## 2020-07-22 NOTE — Progress Notes (Signed)
Subjective: CC: Diarrhea, rash PCP: Janora Norlander, DO HAL:PFXTKW Gabrielle [name] is a 74 y.o. female presenting to clinic today for:  1. Diarrhea Patient reports that she has been having mild episodes of diarrhea. She had 2 yesterday and one today. Does not report any foul odors but was hospitalized for urosepsis and stroke recently. She was just discharged from the nursing facility. She has probiotics and takes these daily and wonders if these should be continued.  2. Rash Patient reports a rash of unknown etiology on the right wrist. She denies any pruritus, pain or spreading elsewhere. She is not been applying anything to this area but would like it to be looked at  3. Hoarseness Patient reports hoarse voice that has been ongoing for several weeks now. She does have history of acid reflux but feels that this is well controlled with Pepcid, which she is compliant with. She does report some postnasal drainage but nothing significant.   ROS: Per HPI  Allergies  Allergen Reactions   Lisinopril Cough   Past Medical History:  Diagnosis Date   Arthritis    Cancer ([place])    Cataract    OU   Colon cancer ([place])    H/O cesarean section    Hx of tonsillectomy    Hyperlipidemia    Hypertension    Macular degeneration    Exu ARMD OU    Current Outpatient Medications:    alendronate (FOSAMAX) 70 MG tablet, Take 1 tablet (70 mg total) by mouth every 7 (seven) days. Take with a full glass of water on an empty stomach., Disp: 4 tablet, Rfl: 11   amLODipine (NORVASC) 5 MG tablet, Take 1 tablet (5 mg total) by mouth daily., Disp: 30 tablet, Rfl: 1   atorvastatin (LIPITOR) 20 MG tablet, Take 1 tablet (20 mg total) by mouth daily., Disp: 90 tablet, Rfl: 3   cholecalciferol (VITAMIN D3) 25 MCG (1000 UT) tablet, Take 1,000 Units by mouth daily., Disp: , Rfl:    CRANBERRY FRUIT PO, Take 1 tablet by mouth daily., Disp: , Rfl:    diclofenac Sodium (VOLTAREN) 1 % GEL, Apply 2 g  topically 4 (four) times daily. , Disp: , Rfl:    diphenoxylate-atropine (LOMOTIL) 2.5-0.025 MG tablet, Take 1 tablet by mouth 4 (four) times daily as needed for diarrhea or loose stools., Disp: , Rfl:    dorzolamide-timolol (COSOPT) 22.3-6.8 MG/ML ophthalmic solution, PLACE 1 DROP INTO BOTH EYES 2 TIMES A DAY, Disp: 10 mL, Rfl: 0   famotidine (PEPCID) 20 MG tablet, Take 1 tablet (20 mg total) by mouth 2 (two) times daily as needed for heartburn or indigestion., Disp: 60 tablet, Rfl: 2   ferrous sulfate 325 (65 FE) MG EC tablet, Take 1 tablet (325 mg total) by mouth 2 (two) times daily with a meal., Disp: 180 tablet, Rfl: 3   lidocaine-prilocaine (EMLA) cream, Apply a small amount to port a cath site and cover with plastic wrap 1 hour prior to chemotherapy appointments, Disp: 30 g, Rfl: 0   losartan-hydrochlorothiazide (HYZAAR) 100-25 MG tablet, Take 1 tablet by mouth daily., Disp: , Rfl:    potassium chloride SA (KLOR-CON) 20 MEQ tablet, Take 1 tablet (20 mEq total) by mouth daily., Disp: 90 tablet, Rfl: 1   LEUCOVORIN CALCIUM IV, Inject into the vein every 14 (fourteen) days. (Patient not taking: Reported on 07/15/2020), Disp: , Rfl:    OXALIPLATIN IV, Inject into the vein every 14 (fourteen) days. (Patient not taking: Reported on 07/15/2020),  Disp: , Rfl:    prochlorperazine (COMPAZINE) 10 MG tablet, Take 1 tablet (10 mg total) by mouth every 6 (six) hours as needed for nausea or vomiting. (Patient not taking: Reported on 07/15/2020), Disp: 30 tablet, Rfl: 0 Social History   Socioeconomic History   Marital status: Divorced    Spouse name: Not on file   Number of children: Not on file   Years of education: Not on file   Highest education level: Not on file  Occupational History   Not on file  Tobacco Use   Smoking status: Never Smoker   Smokeless tobacco: Never Used  Vaping Use   Vaping Use: Never used  Substance and Sexual Activity   Alcohol use: No   Drug use: No    Sexual activity: Not Currently  Other Topics Concern   Not on file  Social History Narrative   Not on file   Social Determinants of Health   Financial Resource Strain: Medium Risk   Difficulty of Paying Living Expenses: Somewhat hard  Food Insecurity: Food Insecurity Present   Worried About Charity fundraiser in the Last Year: Sometimes true   Arboriculturist in the Last Year: Never true  Transportation Needs: Unmet Transportation Needs   Lack of Transportation (Medical): Yes   Lack of Transportation (Non-Medical): Yes  Physical Activity: Insufficiently Active   Days of Exercise per Week: 7 days   Minutes of Exercise per Session: 20 min  Stress: Stress Concern Present   Feeling of Stress : Very much  Social Connections: Moderately Integrated   Frequency of Communication with Friends and Family: More than three times a week   Frequency of Social Gatherings with Friends and Family: Once a week   Attends Religious Services: More than 4 times per year   Active Member of [employer] or Organizations: Yes   Attends Archivist Meetings: Never   Marital Status: Divorced  Human resources officer Violence: Not At Risk   Fear of Current or Ex-Partner: No   Emotionally Abused: No   Physically Abused: No   Sexually Abused: No   Family History  Problem Relation Age of Onset   Macular degeneration Mother    Stroke Mother    Hypertension Mother    Dementia Mother    Cancer Father        lung   Diabetes Sister    Hypertension Sister     Objective: Office vital signs reviewed. BP 129/64    Pulse 75    Temp (!) 97.2 F (36.2 C) (Temporal)    Ht 5\' 2"  (1.575 m)    Wt 121 lb (54.9 kg)    SpO2 100%    BMI 22.13 kg/m   Physical Examination:  General: Awake, alert, has lost quite a bit of weight since her last visit. She appears more frail, No acute distress HEENT: Normal Cardio: regular rate and rhythm, S1S2 heard, murmurs appreciated Pulm: clear to  auscultation bilaterally, no wheezes, rhonchi or rales; normal work of breathing on room air Skin: Blanching, minimally erythematous small patch on the ventral aspect of the right wrist. There is no associated vesicle formation or drainage. Nontender. No edema or fluctuance  Assessment/ Plan: 74 y.o. female   1. Diarrhea, unspecified type Check C. difficile given recent antibiotics and hospitalization. At this point, she is not having frequent diarrheal episodes but I think it is worth checking out before her watchman procedure. She will return the stool cups as soon as possible -  C difficile Toxins A+B W/Rflx  2. Hoarseness of voice Unsure if this is related to acid reflux versus postnasal drip versus vocal cord pathology. Continue Pepcid. If this persist after her watchman, may need to consider eval by ENT  3. Rash Cyst is not itchy and does not demonstrate any evidence of bacterial infection or fungal infection I would like her to use Vaseline applied to the affected area.  I attempted to reach her daughter, Ashok Norris, on 3 different occasions but was unsuccessful reaching her. I will attempt to reach out to her again tomorrow at [phone].  No orders of the defined types were placed in this encounter.  No orders of the defined types were placed in this encounter.    Janora Norlander, DO [place] [phone]

## 2020-07-22 NOTE — Patient Instructions (Addendum)
Gabrielle [name], I'll try and call you again later this afternoon or tomorrow to discuss your mom's care further  To summarize: I do not see any evidence in the discharge summary that was sent over to [place] from the rehab facility that she is supposed be taking ongoing antibiotics.  Typically they would set them up with an infectious disease doctor and home health if they were supposed to still get IV antibiotics.  But again I did not see this documented in their discharge summary so I think she is probably done with antibiotics.  I am not sure what is causing her to stay hoarse.  Her oral exam did not demonstrate any oropharyngeal masses or abnormalities.  Sometimes uncontrolled acid reflux can cause hoarseness, sometimes excessive drainage can cause hoarseness and sometimes irritation to the vocal cords can cause hoarseness.  I think that if symptoms do not resolve after her watchman procedure, we should have her see an ENT to directly visualize the vocal cords.  Just keep me posted and let me know if this is needed and I will place referral  I think she should continue probiotics given how much antibiotic she has been on and that she has ongoing intermittent diarrhea.  I am going to send her home with a stool collection kit to look for C. difficile.  This is a colonic infection that can happen after loss of antibiotics and sometimes is contracted in the hospital.  From what she describes today I think that this is probably unlikely but I think it is worth ruling out.  Since her rash is not itchy and does not demonstrate any evidence of fungal infection, she can apply topical Vaseline until it resolves.

## 2020-07-24 ENCOUNTER — Other Ambulatory Visit: Payer: Medicare HMO

## 2020-07-24 ENCOUNTER — Telehealth: Payer: Self-pay | Admitting: Family Medicine

## 2020-07-24 ENCOUNTER — Other Ambulatory Visit: Payer: Self-pay

## 2020-07-24 DIAGNOSIS — R197 Diarrhea, unspecified: Secondary | ICD-10-CM | POA: Diagnosis not present

## 2020-07-24 MED ORDER — AMLODIPINE BESYLATE 5 MG PO TABS: 5.0000 mg | ORAL_TABLET | Freq: Every day | ORAL | 3 refills | Status: DC

## 2020-07-24 NOTE — Telephone Encounter (Signed)
rx sent over and pt's daughter is aware. She would like to see if you would be able to call her Monday to discuss her mothers medications.

## 2020-07-24 NOTE — Telephone Encounter (Signed)
Pts daughter says that when pt was in a rehab facility they had her taking Amlodipine, 5mg . Says pt is almost out of this medicine and has no refills. Wants to know if Dr Lajuana Ripple wants to keep her on this medicine and if so, can she send in refills for it. She is currently taking 1x daily.

## 2020-07-24 NOTE — Telephone Encounter (Signed)
Yes please renew norvasc 5 mg #90 w 3 rf

## 2020-07-27 ENCOUNTER — Other Ambulatory Visit: Payer: Self-pay | Admitting: Family Medicine

## 2020-07-27 ENCOUNTER — Encounter: Payer: Self-pay | Admitting: Family Medicine

## 2020-07-27 DIAGNOSIS — A0472 Enterocolitis due to Clostridium difficile, not specified as recurrent: Secondary | ICD-10-CM

## 2020-07-27 LAB — C DIFFICILE TOXINS A+B W/RFLX: C difficile Toxins A+B, EIA: POSITIVE — AB

## 2020-07-27 MED ORDER — VANCOMYCIN HCL 125 MG PO CAPS: 125.0000 mg | ORAL_CAPSULE | Freq: Four times a day (QID) | ORAL | 0 refills | Status: AC

## 2020-07-27 MED ORDER — FLUCONAZOLE 150 MG PO TABS
150.0000 mg | ORAL_TABLET | Freq: Once | ORAL | 0 refills | Status: AC
Start: 2020-07-27 — End: 2020-07-27

## 2020-07-27 NOTE — Telephone Encounter (Signed)
Spoke to daughter today.

## 2020-07-28 ENCOUNTER — Ambulatory Visit (HOSPITAL_COMMUNITY): Payer: Medicare HMO | Admitting: Hematology

## 2020-08-03 ENCOUNTER — Encounter (HOSPITAL_COMMUNITY): Payer: Self-pay | Admitting: Hematology

## 2020-08-03 ENCOUNTER — Other Ambulatory Visit: Payer: Self-pay

## 2020-08-03 ENCOUNTER — Inpatient Hospital Stay (HOSPITAL_COMMUNITY): Payer: Medicare HMO | Attending: Hematology | Admitting: Hematology

## 2020-08-03 VITALS — BP 128/56 | HR 66 | Temp 98.4°F | Resp 16 | Wt 121.4 lb

## 2020-08-03 DIAGNOSIS — N189 Chronic kidney disease, unspecified: Secondary | ICD-10-CM | POA: Insufficient documentation

## 2020-08-03 DIAGNOSIS — D509 Iron deficiency anemia, unspecified: Secondary | ICD-10-CM | POA: Insufficient documentation

## 2020-08-03 DIAGNOSIS — C189 Malignant neoplasm of colon, unspecified: Secondary | ICD-10-CM

## 2020-08-03 DIAGNOSIS — C182 Malignant neoplasm of ascending colon: Secondary | ICD-10-CM | POA: Insufficient documentation

## 2020-08-03 DIAGNOSIS — D631 Anemia in chronic kidney disease: Secondary | ICD-10-CM | POA: Insufficient documentation

## 2020-08-03 LAB — COMPREHENSIVE METABOLIC PANEL
ALT: 9 U/L (ref 0–44)
AST: 13 U/L — ABNORMAL LOW (ref 15–41)
Albumin: 3.8 g/dL (ref 3.5–5.0)
Alkaline Phosphatase: 49 U/L (ref 38–126)
Anion gap: 10 (ref 5–15)
BUN: 17 mg/dL (ref 8–23)
CO2: 29 mmol/L (ref 22–32)
Calcium: 9.5 mg/dL (ref 8.9–10.3)
Chloride: 100 mmol/L (ref 98–111)
Creatinine, Ser: 1.07 mg/dL — ABNORMAL HIGH (ref 0.44–1.00)
GFR calc Af Amer: 60 mL/min — ABNORMAL LOW (ref >60)
GFR calc non Af Amer: 51 mL/min — ABNORMAL LOW (ref >60)
Glucose, Bld: 95 mg/dL (ref 70–99)
Potassium: 3.3 mmol/L — ABNORMAL LOW (ref 3.5–5.1)
Sodium: 139 mmol/L (ref 135–145)
Total Bilirubin: 0.6 mg/dL (ref 0.3–1.2)
Total Protein: 6.9 g/dL (ref 6.5–8.1)

## 2020-08-03 LAB — CBC WITH DIFFERENTIAL/PLATELET
Abs Immature Granulocytes: 0.02 10*3/uL (ref 0.00–0.07)
Basophils Absolute: 0 10*3/uL (ref 0.0–0.1)
Basophils Relative: 0 %
Eosinophils Absolute: 0.1 10*3/uL (ref 0.0–0.5)
Eosinophils Relative: 1 %
HCT: 36 % (ref 36.0–46.0)
Hemoglobin: 11.5 g/dL — ABNORMAL LOW (ref 12.0–15.0)
Immature Granulocytes: 0 %
Lymphocytes Relative: 25 %
Lymphs Abs: 1.3 10*3/uL (ref 0.7–4.0)
MCH: 31.9 pg (ref 26.0–34.0)
MCHC: 31.9 g/dL (ref 30.0–36.0)
MCV: 99.7 fL (ref 80.0–100.0)
Monocytes Absolute: 0.4 10*3/uL (ref 0.1–1.0)
Monocytes Relative: 8 %
Neutro Abs: 3.4 10*3/uL (ref 1.7–7.7)
Neutrophils Relative %: 66 %
Platelets: 155 10*3/uL (ref 150–400)
RBC: 3.61 MIL/uL — ABNORMAL LOW (ref 3.87–5.11)
RDW: 14.6 % (ref 11.5–15.5)
WBC: 5.2 10*3/uL (ref 4.0–10.5)
nRBC: 0 % (ref 0.0–0.2)

## 2020-08-03 MED ORDER — SODIUM CHLORIDE 0.9% FLUSH
10.0000 mL | INTRAVENOUS | Status: DC | PRN
  Administered 2020-08-03: 10 mL via INTRAVENOUS

## 2020-08-03 MED ORDER — HEPARIN SOD (PORK) LOCK FLUSH 100 UNIT/ML IV SOLN
500.0000 [IU] | Freq: Once | INTRAVENOUS | Status: AC
  Administered 2020-08-03: 500 [IU] via INTRAVENOUS

## 2020-08-03 NOTE — Progress Notes (Signed)
[place], [place] [address]   CLINIC:  Medical Oncology/Hematology  PCP:  Janora Norlander, DO [address] [place] [address] [phone]   REASON FOR VISIT:  Follow-up for right colon cancer  PRIOR THERAPY:  1. Right hemicolectomy on 04/08/2020 2. FOLFOX x 3 cycles from 05/19/2020 to 07/14/2020.  NGS Results: KRAS/NRAS wild-type, MSI high, BRAF V 600 E  CURRENT THERAPY: Observation  BRIEF ONCOLOGIC HISTORY:  Oncology History  Malignant neoplasm of colon ([place])  04/07/2020 Initial Diagnosis   Malignant neoplasm of ascending colon ([place])   05/13/2020 Genetic Testing   Foundation One     05/19/2020 -  Chemotherapy   The patient had palonosetron (ALOXI) injection 0.25 mg, 0.25 mg, Intravenous,  Once, 2 of 12 cycles Administration: 0.25 mg (05/19/2020), 0.25 mg (06/30/2020) leucovorin 522 mg in dextrose 5 % 250 mL infusion, 320 mg/m2 = 522 mg (80 % of original dose 400 mg/m2), Intravenous,  Once, 2 of 12 cycles Dose modification: 320 mg/m2 (80 % of original dose 400 mg/m2, Cycle 1, Reason: Provider Judgment), 333.8329 mg/m2 (66.7 % of original dose 400 mg/m2, Cycle 2, Reason: Provider Judgment) Administration: 522 mg (05/19/2020), 434 mg (06/30/2020) oxaliplatin (ELOXATIN) 110 mg in dextrose 5 % 500 mL chemo infusion, 68 mg/m2 = 110 mg (80 % of original dose 85 mg/m2), Intravenous,  Once, 1 of 1 cycle Dose modification: 68 mg/m2 (80 % of original dose 85 mg/m2, Cycle 1, Reason: Provider Judgment) Administration: 110 mg (05/19/2020) fluorouracil (ADRUCIL) chemo injection 500 mg, 320 mg/m2 = 500 mg (80 % of original dose 400 mg/m2), Intravenous,  Once, 2 of 12 cycles Dose modification: 320 mg/m2 (80 % of original dose 400 mg/m2, Cycle 1, Reason: Provider Judgment), 191.6606 mg/m2 (66.7 % of original dose 400 mg/m2, Cycle 2, Reason: Provider Judgment) Administration: 500 mg (05/19/2020), 450 mg (06/30/2020) fluorouracil (ADRUCIL) 3,150 mg in  sodium chloride 0.9 % 87 mL chemo infusion, 1,920 mg/m2 = 3,150 mg (80 % of original dose 2,400 mg/m2), Intravenous, 1 Day/Dose, 2 of 12 cycles Dose modification: 1,920 mg/m2 (80 % of original dose 2,400 mg/m2, Cycle 1, Reason: Provider Judgment), 1,600 mg/m2 (66.7 % of original dose 2,400 mg/m2, Cycle 2, Reason: Provider Judgment) Administration: 3,150 mg (05/19/2020), 2,600 mg (06/30/2020)  for chemotherapy treatment.      CANCER STAGING: Cancer Staging Malignant neoplasm of colon [facility]) Staging form: Colon and Rectum, AJCC 8th Edition - Clinical stage from 04/27/2020: Stage IIIB (cT4a, cN1a, cM0) - Unsigned   INTERVAL HISTORY:  Gabrielle [name], a 74 y.o. female, returns for routine follow-up of her right colon cancer. Gabrielle [name] was last seen on 06/30/2020.   Today she is accompanied by her daughter. She was taken to [facility] on 8/1 for paroxysmal A-fib and C-diff diarrhea. She currently has C diff diarrhea and is taking vancomycin QID for 10 days, through 08/06/2020. Her BM's are loose currently. She is taking iron tablets BID.  She will have her Watchman procedure on the week of 9/6.  REVIEW OF SYSTEMS:  Review of Systems  Constitutional: Positive for appetite change (mildly decreased) and fatigue (moderate).  Eyes: Positive for eye problems (hazy vision).  Gastrointestinal: Positive for diarrhea (C-diff diarrhea).  All other systems reviewed and are negative.   PAST MEDICAL/SURGICAL HISTORY:  Past Medical History:  Diagnosis Date  . Arthritis   . Cancer ([place])   . Cataract    OU  . Colon cancer ([place])   .  H/O cesarean section   . Hx of tonsillectomy   . Hyperlipidemia   . Hypertension   . Macular degeneration    Exu ARMD OU   Past Surgical History:  Procedure Laterality Date  . BIOPSY  04/06/2020   Procedure: BIOPSY;  Surgeon: Rogene Houston, MD;  Location: [facility] ENDO SU[facility];  Service: Endoscopy;;  . CARPAL TUNNEL RELEASE Right   . CATARACT EXTRACTION W/PHACO Left 10/11/2019    Procedure: CATARACT EXTRACTION PHACO AND INTRAOCULAR LENS PLACEMENT (IOC);  Surgeon: Baruch Goldmann, MD;  Location: [facility] ORS;  Service: Ophthalmology;  Laterality: Left;  CDE: 8.56  . CATARACT EXTRACTION W/PHACO Right 10/25/2019   Procedure: CATARACT EXTRACTION PHACO AND INTRAOCULAR LENS PLACEMENT (IOC);  Surgeon: Baruch Goldmann, MD;  Location: [facility] ORS;  Service: Ophthalmology;  Laterality: Right;  CDE: 5.67  . CESAREAN SECTION    . COLONOSCOPY N/A 04/06/2020   Procedure: COLONOSCOPY;  Surgeon: Rogene Houston, MD;  Location: [facility] ENDO SUITE;  Service: Endoscopy;  Laterality: N/A;  . CYSTOSCOPY WITH BIOPSY N/A 04/08/2020   Procedure: CYSTOSCOPY WITH BIOPSY;  Surgeon: Cleon Gustin, MD;  Location: [facility] ORS;  Service: Urology;  Laterality: N/A;  . ESOPHAGOGASTRODUODENOSCOPY N/A 04/05/2020   Procedure: ESOPHAGOGASTRODUODENOSCOPY (EGD);  Surgeon: Rogene Houston, MD;  Location: [facility] END[facility] SU[facility];  Service: Endoscopy;  Laterality: N/A;  . PARTIAL COLECTOMY N/A 04/08/2020   Procedure: PARTIAL COLECTOMY;  Surgeon: Virl Cagey, MD;  Location: [facility] ORS;  Service: General;  Laterality: N/A;  . PORTACATH PLACEMENT Left 05/18/2020   Procedure: INSERTION PORT-A-CATH (ATTACHED CATHETER IN LEFT SUBCLAVIAN);  Surgeon: Virl Cagey, MD;  Location: [facility] ORS;  Service: General;  Laterality: Left;  . TONSILLECTOMY      SOCIAL HISTORY:  Social History   Socioeconomic History  . Marital status: Divorced    Spouse name: Not on file  . Number of children: Not on file  . Years of education: Not on file  . Highest education level: Not on file  Occupational History  . Not on file  Tobacco Use  . Smoking status: Never Smoker  . Smokeless tobacco: Never Used  Vaping Use  . Vaping Use: Never used  Substance and Sexual Activity  . Alcohol use: No  . Drug use: No  . Sexual activity: Not Currently  Other Topics Concern  . Not on file  Social History Narrative  . Not on file   Social Determinants of Health    Financial Resource Strain: Medium Risk  . Difficulty of Paying Living Expenses: Somewhat hard  Food Insecurity: Food Insecurity Present  . Worried About Charity fundraiser in the Last Year: Sometimes true  . Ran Out of Food in the Last Year: Never true  Transportation Needs: Unmet Transportation Needs  . Lack of Transportation (Medical): Yes  . Lack of Transportation (Non-Medical): Yes  Physical Activity: Insufficiently Active  . Days of Exercise per Week: 7 days  . Minutes of Exercise per Session: 20 min  Stress: Stress Concern Present  . Feeling of Stress : Very much  Social Connections: Moderately Integrated  . Frequency of Communication with Friends and Family: More than three times a week  . Frequency of Social Gatherings with Friends and Family: Once a week  . Attends Religious Services: More than 4 times per year  . Active Member of Clubs or Organizations: Yes  . Attends Archivist Meetings: Never  . Marital Status: Divorced  Human resources officer Violence: Not At Risk  . Fear of  Current or Ex-Partner: No  . Emotionally Abused: No  . Physically Abused: No  . Sexually Abused: No    FAMILY HISTORY:  Family History  Problem Relation Age of Onset  . Macular degeneration Mother   . Stroke Mother   . Hypertension Mother   . Dementia Mother   . Cancer Father        lung  . Diabetes Sister   . Hypertension Sister     CURRENT MEDICATIONS:  Current Outpatient Medications  Medication Sig Dispense Refill  . alendronate (FOSAMAX) 70 MG tablet Take 1 tablet (70 mg total) by mouth every 7 (seven) days. Take with a full glass of water on an empty stomach. 4 tablet 11  . amLODipine (NORVASC) 5 MG tablet Take 1 tablet (5 mg total) by mouth daily. 90 tablet 3  . atorvastatin (LIPITOR) 20 MG tablet Take 1 tablet (20 mg total) by mouth daily. 90 tablet 3  . cholecalciferol (VITAMIN D3) 25 MCG (1000 UT) tablet Take 1,000 Units by mouth daily.    Marland Kitchen CRANBERRY FRUIT PO Take 1  tablet by mouth daily.    . diclofenac Sodium (VOLTAREN) 1 % GEL Apply 2 g topically 4 (four) times daily.     . dorzolamide-timolol (COSOPT) 22.3-6.8 MG/ML ophthalmic solution PLACE 1 DROP INTO BOTH EYES 2 TIMES A DAY 10 mL 0  . famotidine (PEPCID) 20 MG tablet Take 1 tablet (20 mg total) by mouth 2 (two) times daily as needed for heartburn or indigestion. 60 tablet 2  . ferrous sulfate 325 (65 FE) MG EC tablet Take 1 tablet (325 mg total) by mouth 2 (two) times daily with a meal. 180 tablet 3  . losartan-hydrochlorothiazide (HYZAAR) 100-25 MG tablet Take 1 tablet by mouth daily.    . potassium chloride SA (KLOR-CON) 20 MEQ tablet Take 1 tablet (20 mEq total) by mouth daily. 90 tablet 1  . saccharomyces boulardii (FLORASTOR) 250 MG capsule Take by mouth.    . vancomycin (VANCOCIN) 125 MG capsule Take 1 capsule (125 mg total) by mouth 4 (four) times daily for 10 days. 40 capsule 0  . diphenoxylate-atropine (LOMOTIL) 2.5-0.025 MG tablet Take 1 tablet by mouth 4 (four) times daily as needed for diarrhea or loose stools. (Patient not taking: Reported on 08/03/2020)    . fluconazole (DIFLUCAN) 150 MG tablet Take by mouth. (Patient not taking: Reported on 08/03/2020)    . LEUCOVORIN CALCIUM IV Inject into the vein every 14 (fourteen) days. (Patient not taking: Reported on 07/15/2020)    . lidocaine-prilocaine (EMLA) cream Apply a small amount to port a cath site and cover with plastic wrap 1 hour prior to chemotherapy appointments 30 g 0  . OXALIPLATIN IV Inject into the vein every 14 (fourteen) days. (Patient not taking: Reported on 07/15/2020)    . prochlorperazine (COMPAZINE) 10 MG tablet Take 1 tablet (10 mg total) by mouth every 6 (six) hours as needed for nausea or vomiting. (Patient not taking: Reported on 07/15/2020) 30 tablet 0   No current facility-administered medications for this visit.    ALLERGIES:  Allergies  Allergen Reactions  . Lisinopril Cough    PHYSICAL EXAM:  Performance status  (ECOG): 1 - Symptomatic but completely ambulatory  Vitals:   08/03/20 1048  BP: (!) 128/56  Pulse: 66  Resp: 16  Temp: 98.4 F (36.9 C)  SpO2: 100%   Wt Readings from Last 3 Encounters:  08/03/20 121 lb 6.4 oz (55.1 kg)  07/22/20 121 lb (54.9 kg)  07/15/20 128 lb 6.4 oz (58.2 kg)   Physical Exam Vitals reviewed.  Constitutional:      Appearance: Normal appearance.  Chest:     Comments: Port-a-Cath in L chest Neurological:     General: No focal deficit present.     Mental Status: She is alert and oriented to person, place, and time.  Psychiatric:        Mood and Affect: Mood normal.        Behavior: Behavior normal.      LABORATORY DATA:  I have reviewed the labs as listed.  CBC Latest Ref Rng & Units 07/15/2020 07/05/2020 07/04/2020  WBC 3.4 - 10.8 x10E3/uL 7.3 13.1(H) 16.2(H)  Hemoglobin 11.1 - 15.9 g/dL 11.0(L) 9.8(L) 8.8(L)  Hematocrit 34.0 - 46.6 % 34.2 32.2(L) 27.7(L)  Platelets 150 - 450 x10E3/uL 335 171 165   CMP Latest Ref Rng & Units 07/15/2020 07/05/2020 07/04/2020  Glucose 65 - 99 mg/dL 91 110(H) 126(H)  BUN 8 - 27 mg/dL 12 22 28(H)  Creatinine 0.57 - 1.00 mg/dL 0.87 1.29(H) 1.84(H)  Sodium 134 - 144 mmol/L 137 134(L) 131(L)  Potassium 3.5 - 5.2 mmol/L 4.3 3.1(L) 3.0(L)  Chloride 96 - 106 mmol/L 101 103 97(L)  CO2 20 - 29 mmol/L '25 24 25  ' Calcium 8.7 - 10.3 mg/dL 8.2(L) 7.5(L) 7.6(L)  Total Protein 6.5 - 8.1 g/dL - 5.6(L) 5.9(L)  Total Bilirubin 0.3 - 1.2 mg/dL - 0.5 0.6  Alkaline Phos 38 - 126 U/L - 76 70  AST 15 - 41 U/L - 7(L) 8(L)  ALT 0 - 44 U/L - 6 6    DIAGNOSTIC IMAGING:  I have independently reviewed the scans and discussed with the patient. CT CEREBRAL PERFUSION W CONTRAST  Result Date: 07/04/2020 CLINICAL DATA:  Aphasia.  Code stroke. EXAM: CT ANGIOGRAPHY HEAD AND NECK CT PERFUSION BRAIN TECHNIQUE: Multidetector CT imaging of the head and neck was performed using the standard protocol during bolus administration of intravenous contrast.  Multiplanar CT image reconstructions and MIPs were obtained to evaluate the vascular anatomy. Carotid stenosis measurements (when applicable) are obtained utilizing NASCET criteria, using the distal internal carotid diameter as the denominator. Multiphase CT imaging of the brain was performed following IV bolus contrast injection. Subsequent parametric perfusion maps were calculated using RAPID software. CONTRAST:  30m OMNIPAQUE IOHEXOL 350 MG/ML SOLN COMPARISON:  Noncontrast head CT performed earlier the same day 07/04/2020. FINDINGS: CTA NECK FINDINGS Aortic arch: Standard aortic branching. Atherosclerotic plaque within the visualized aortic arch and proximal major branch vessels of the neck. No hemodynamically significant innominate or proximal subclavian artery stenosis. Right carotid system: CCA and ICA patent within the neck without significant stenosis (50% or greater). Mild mixed plaque within the carotid bifurcation Left carotid system: CCA and ICA patent within the neck without significant stenosis (50% or greater). Minimal mixed plaque within the CCA and carotid bifurcation. Vertebral arteries: Codominant patent within the neck bilaterally. Moderate atherosclerotic narrowing at the origin of the left vertebral artery. Skeleton: No acute bony abnormality Other neck: No neck mass or cervical lymphadenopathy. Subcentimeter right thyroid lobe nodule not meeting consensus criteria for ultrasound follow-up. Upper chest: No consolidation within the imaged lung apices Review of the MIP images confirms the above findings CTA HEAD FINDINGS Anterior circulation: The intracranial internal carotid arteries are patent. Calcified plaque within both vessels. Moderate stenosis of the supraclinoid left ICA. Otherwise, no more than mild narrowing of these vessels. The M1 middle cerebral arteries are patent. There is a high-grade  focal stenosis within the mid M1 left MCA. Atherosclerotic irregularity of the M2 and more  distal MCA branch vessels bilaterally. Most notably, there is a high-grade focal stenosis within a proximal M2 left MCA branch vessel (series 9, image 28). Additionally, there is a high-grade stenosis within a mid M2 MCA branch vessel on the right (series 9, image 15). No proximal branch occlusion is identified. The anterior cerebral arteries are patent. There is atherosclerotic irregularity of both anterior cerebral arteries. Most notably, there is a high-grade focal stenosis within the proximal A2 right anterior cerebral artery (series 4, image 92). Additionally, there is a high-grade focal stenosis within the left anterior cerebral artery at the A2/A3 junction. No intracranial aneurysm is identified. Posterior circulation: The intracranial vertebral arteries are patent. There is a moderate/severe focal stenosis within the V4 right vertebral artery at the level of the skull base. The basilar artery is patent without significant stenosis. Fetal origin left posterior cerebral artery. There is a moderate/severe focal stenosis within the P2 left PCA (series 9, image 24). High-grade focal stenosis within the left PCA at the P3/P4 junction. Sites of moderate stenosis within the P2 right PCA. Additionally, there are sites of severe stenosis within the right PCA at the P3/P4 junction. The right posterior communicating artery is hypoplastic or absent Venous sinuses: Within limitations of contrast timing, no convincing thrombus. Anatomic variants: As described Review of the MIP images confirms the above findings CT Brain Perfusion Findings: ASPECTS: 10 CBF (<30%) Volume: 64m Perfusion (Tmax>6.0s) volume: 023m However, there is a 5 mL region within the left frontal lobe, within the left MCA vascular territory, demonstrating a Tmax greater than 4 seconds. Mismatch Volume: 16m416mnfarction Location:None identified These results were called by telephone at the time of interpretation on 07/04/2020 at 9:24 pm to provider Dr.  GolRegenia Skeeterho verbally acknowledged these results. IMPRESSION: CTA neck: 1. The bilateral common and internal carotid arteries are patent within the neck without hemodynamically significant stenosis. 2. The vertebral arteries are patent within the neck bilaterally. Moderate atherosclerotic narrowing at the origin of the left vertebral artery. CTA head: 1. No intracranial large vessel occlusion. 2. Intracranial atherosclerotic disease with multifocal stenoses, most notably as follows. 3. High-grade focal stenosis within the M1 left middle cerebral artery. 4. High-grade focal stenosis within a proximal M2 left MCA branch vessel. 5. High-grade focal stenosis within a mid M2 right MCA branch vessel. 6. High-grade focal stenosis within the proximal A2 right ACA. 7. High-grade focal stenosis within the left ACA at the A2/A3 junction. 8. Moderate stenosis of the supraclinoid left ICA. 9. Moderate/severe focal stenosis within the right vertebral artery at the level of the skull base. 10. Moderate/severe focal stenosis within the P2 left PCA. 11. High-grade focal stenosis within the left PCA at the P3/P4 junction. 12. Sites of moderate stenosis within the P2 right PCA. 13. High-grade stenoses within the right PCA at the P3/P4 junction. CT perfusion head: The perfusion software identifies no core infarct. The perfusion software identifies no critically hypoperfused parenchyma utilizing the Tmax >6 seconds threshold. However, there is a 5 mL region within the left frontal lobe demonstrating a Tmax >4 seconds within the left MCA vascular territory. Electronically Signed   By: KylKellie Simmering   On: 07/04/2020 21:24   DG Chest Portable 1 View  Result Date: 07/04/2020 CLINICAL DATA:  Altered mental status EXAM: PORTABLE CHEST 1 VIEW COMPARISON:  05/18/2020 FINDINGS: Cardiac shadow is stable. Aortic calcifications are seen. Left chest wall port  is noted and stable. The lungs are well aerated without focal infiltrate or sizable  effusion. Chronic interstitial changes are again seen. No new focal abnormality is noted. IMPRESSION: No acute abnormality noted. Electronically Signed   By: Inez Catalina M.D.   On: 07/04/2020 21:14   ECHOCARDIOGRAM COMPLETE  Result Date: 07/05/2020    ECHOCARDIOGRAM REPORT   Patient Name:   Gabrielle [name] Date of Exam: 07/05/2020 Medical Rec #:  [id number]       Height:       62.0 in Accession #:    [id number]      Weight:       131.4 lb Date of Birth:  09/01/1946      BSA:          1.599 m Patient Age:    89 years        BP:           131/52 mmHg Patient Gender: F               HR:           94 bpm. Exam Location:  [facility] Procedure: 2D Echo, Cardiac Doppler and Color Doppler Indications:    CVA  History:        Patient has prior history of Echocardiogram examinations, most                 recent 04/07/2020. Stroke; Risk Factors:Hypertension and                 Dyslipidemia. Colon cancer, Chemo.  Sonographer:    Dustin Flock RDCS Referring Phys: [id number] OLADAPO ADEFESO IMPRESSIONS  1. Left ventricular ejection fraction, by estimation, is 60 to 65%. The left ventricle has normal function. The left ventricle has no regional wall motion abnormalities. Left ventricular diastolic parameters are consistent with Grade I diastolic dysfunction (impaired relaxation).  2. Right ventricular systolic function is normal. The right ventricular size is normal.  3. Left atrial size was moderately dilated.  4. The mitral valve is normal in structure. No evidence of mitral valve regurgitation. No evidence of mitral stenosis.  5. The aortic valve is normal in structure. Aortic valve regurgitation is trivial. No aortic stenosis is present.  6. The inferior vena cava is normal in size with greater than 50% respiratory variability, suggesting right atrial pressure of 3 mmHg. Conclusion(s)/Recommendation(s): No intracardiac source of embolism detected on this transthoracic study. A transesophageal echocardiogram is recommended to  exclude cardiac source of embolism if clinically indicated. FINDINGS  Left Ventricle: Left ventricular ejection fraction, by estimation, is 60 to 65%. The left ventricle has normal function. The left ventricle has no regional wall motion abnormalities. The left ventricular internal cavity size was normal in size. There is  no left ventricular hypertrophy. Left ventricular diastolic parameters are consistent with Grade I diastolic dysfunction (impaired relaxation). Right Ventricle: The right ventricular size is normal. No increase in right ventricular wall thickness. Right ventricular systolic function is normal. Left Atrium: Left atrial size was moderately dilated. Right Atrium: Right atrial size was normal in size. Pericardium: There is no evidence of pericardial effusion. Mitral Valve: The mitral valve is normal in structure. Normal mobility of the mitral valve leaflets. No evidence of mitral valve regurgitation. No evidence of mitral valve stenosis. Tricuspid Valve: The tricuspid valve is normal in structure. Tricuspid valve regurgitation is not demonstrated. No evidence of tricuspid stenosis. Aortic Valve: The aortic valve is normal in structure. Aortic valve regurgitation is trivial.  No aortic stenosis is present. Aortic valve peak gradient measures 13.2 mmHg. Pulmonic Valve: The pulmonic valve was normal in structure. Pulmonic valve regurgitation is not visualized. No evidence of pulmonic stenosis. Aorta: The aortic root is normal in size and structure. Venous: The inferior vena cava is normal in size with greater than 50% respiratory variability, suggesting right atrial pressure of 3 mmHg. IAS/Shunts: No atrial level shunt detected by color flow Doppler.  LEFT VENTRICLE PLAX 2D LVIDd:         3.75 cm  Diastology LVIDs:         2.35 cm  LV e' lateral:   8.38 cm/s LV PW:         1.17 cm  LV E/e' lateral: 10.9 LV IVS:        1.08 cm  LV e' medial:    5.11 cm/s LVOT diam:     1.90 cm  LV E/e' medial:  17.9 LV SV:          46 LV SV Index:   29 LVOT Area:     2.84 cm  RIGHT VENTRICLE RV Basal diam:  3.45 cm RV S prime:     18.80 cm/s TAPSE (M-mode): 2.9 cm LEFT ATRIUM             Index       RIGHT ATRIUM           Index LA diam:        3.30 cm 2.06 cm/m  RA Area:     13.50 cm LA Vol (A2C):   74.4 ml 46.53 ml/m RA Volume:   33.00 ml  20.64 ml/m LA Vol (A4C):   59.6 ml 37.27 ml/m LA Biplane Vol: 67.0 ml 41.90 ml/m  AORTIC VALVE AV Area (Vmax): 1.79 cm AV Vmax:        182.00 cm/s AV Peak Grad:   13.2 mmHg LVOT Vmax:      115.00 cm/s LVOT Vmean:     68.500 cm/s LVOT VTI:       0.164 m  AORTA Ao Root diam: 2.70 cm MITRAL VALVE MV Area (PHT): 5.20 cm    SHUNTS MV Decel Time: 146 msec    Systemic VTI:  0.16 m MV E velocity: 91.70 cm/s  Systemic Diam: 1.90 cm MV A velocity: 98.10 cm/s MV E/A ratio:  0.93 Candee Furbish MD Electronically signed by Candee Furbish MD Signature Date/Time: 07/05/2020/11:05:51 AM    Final    CT HEAD CODE STROKE WO CONTRAST  Result Date: 07/04/2020 CLINICAL DATA:  Neuro deficit, acute, stroke suspected. Additional provided: Aphasia, last known well 3:30 p.m., neighbor stated when they went to check on patient she could not speak. EXAM: CT HEAD WITHOUT CONTRAST TECHNIQUE: Contiguous axial images were obtained from the base of the skull through the vertex without intravenous contrast. COMPARISON:  No pertinent prior exams are available for comparison. FINDINGS: Brain: Cerebral volume is normal for age. Minimal ill-defined hypoattenuation within the cerebral white matter is nonspecific, but consistent with chronic small vessel ischemic disease. There is no acute intracranial hemorrhage. No demarcated cortical infarct is identified. No extra-axial fluid collection. No evidence of intracranial mass. No midline shift. Vascular: No hyperdense vessel Skull: Normal. Negative for fracture or focal lesion. Sinuses/Orbits: Visualized orbits show no acute finding. Mild ethmoid sinus mucosal thickening. No significant  mastoid effusion at the imaged levels ASPECTS [facility] Stroke Program Early CT Score) - Ganglionic level infarction (caudate, lentiform nuclei, internal capsule, insula, M1-M3 cortex): 7 -  Supraganglionic infarction (M4-M6 cortex): 3 Total score (0-10 with 10 being normal): 10 These results were called by telephone at the time of interpretation on 07/04/2020 at 8:24 pm to provider Sherwood Gambler , who verbally acknowledged these results. IMPRESSION: 1. No CT evidence of acute intracranial abnormality.  ASPECTS is 10. 2. Mild chronic small vessel ischemic changes within the cerebral white matter. 3. Mild ethmoid sinus mucosal thickening. Electronically Signed   By: Kellie Simmering DO   On: 07/04/2020 20:24   CT ANGIO HEAD CODE STROKE  Result Date: 07/05/2020 CLINICAL DATA:  Aphasia.  Code stroke. EXAM: CT ANGIOGRAPHY HEAD AND NECK CT PERFUSION BRAIN TECHNIQUE: Multidetector CT imaging of the head and neck was performed using the standard protocol during bolus administration of intravenous contrast. Multiplanar CT image reconstructions and MIPs were obtained to evaluate the vascular anatomy. Carotid stenosis measurements (when applicable) are obtained utilizing NASCET criteria, using the distal internal carotid diameter as the denominator. Multiphase CT imaging of the brain was performed following IV bolus contrast injection. Subsequent parametric perfusion maps were calculated using RAPID software. CONTRAST:  66m OMNIPAQUE IOHEXOL 350 MG/ML SOLN COMPARISON:  Noncontrast head CT performed earlier the same day 07/04/2020. FINDINGS: CTA NECK FINDINGS Aortic arch: Standard aortic branching. Atherosclerotic plaque within the visualized aortic arch and proximal major branch vessels of the neck. No hemodynamically significant innominate or proximal subclavian artery stenosis. Right carotid system: CCA and ICA patent within the neck without significant stenosis (50% or greater). Mild mixed plaque within the carotid  bifurcation Left carotid system: CCA and ICA patent within the neck without significant stenosis (50% or greater). Minimal mixed plaque within the CCA and carotid bifurcation. Vertebral arteries: Codominant patent within the neck bilaterally. Moderate atherosclerotic narrowing at the origin of the left vertebral artery. Skeleton: No acute bony abnormality Other neck: No neck mass or cervical lymphadenopathy. Subcentimeter right thyroid lobe nodule not meeting consensus criteria for ultrasound follow-up. Upper chest: No consolidation within the imaged lung apices Review of the MIP images confirms the above findings CTA HEAD FINDINGS Anterior circulation: The intracranial internal carotid arteries are patent. Calcified plaque within both vessels. Moderate stenosis of the supraclinoid left ICA. Otherwise, no more than mild narrowing of these vessels. The M1 middle cerebral arteries are patent. There is a high-grade focal stenosis within the mid M1 left MCA. Atherosclerotic irregularity of the M2 and more distal MCA branch vessels bilaterally. Most notably, there is a high-grade focal stenosis within a proximal M2 left MCA branch vessel (series 9, image 28). Additionally, there is a high-grade stenosis within a mid M2 MCA branch vessel on the right (series 9, image 15). No proximal branch occlusion is identified. The anterior cerebral arteries are patent. There is atherosclerotic irregularity of both anterior cerebral arteries. Most notably, there is a high-grade focal stenosis within the proximal A2 right anterior cerebral artery (series 4, image 92). Additionally, there is a high-grade focal stenosis within the left anterior cerebral artery at the A2/A3 junction. No intracranial aneurysm is identified. Posterior circulation: The intracranial vertebral arteries are patent. There is a moderate/severe focal stenosis within the V4 right vertebral artery at the level of the skull base. The basilar artery is patent without  significant stenosis. Fetal origin left posterior cerebral artery. There is a moderate/severe focal stenosis within the P2 left PCA (series 9, image 24). High-grade focal stenosis within the left PCA at the P3/P4 junction. Sites of moderate stenosis within the P2 right PCA. Additionally, there are sites of severe stenosis  within the right PCA at the P3/P4 junction. The right posterior communicating artery is hypoplastic or absent Venous sinuses: Within limitations of contrast timing, no convincing thrombus. Anatomic variants: As described Review of the MIP images confirms the above findings CT Brain Perfusion Findings: ASPECTS: 10 CBF (<30%) Volume: 47m Perfusion (Tmax>6.0s) volume: 0566m However, there is a 5 mL region within the left frontal lobe, within the left MCA vascular territory, demonstrating a Tmax greater than 4 seconds. Mismatch Volume: 66m69mnfarction Location:None identified These results were called by telephone at the time of interpretation on 07/04/2020 at 9:24 pm to provider Dr. GolRegenia Skeeterho verbally acknowledged these results. IMPRESSION: CTA neck: 1. The bilateral common and internal carotid arteries are patent within the neck without hemodynamically significant stenosis. 2. The vertebral arteries are patent within the neck bilaterally. Moderate atherosclerotic narrowing at the origin of the left vertebral artery. CTA head: 1. No intracranial large vessel occlusion. 2. Intracranial atherosclerotic disease with multifocal stenoses, most notably as follows. 3. High-grade focal stenosis within the M1 left middle cerebral artery. 4. High-grade focal stenosis within a proximal M2 left MCA branch vessel. 5. High-grade focal stenosis within a mid M2 right MCA branch vessel. 6. High-grade focal stenosis within the proximal A2 right ACA. 7. High-grade focal stenosis within the left ACA at the A2/A3 junction. 8. Moderate stenosis of the supraclinoid left ICA. 9. Moderate/severe focal stenosis within the  right vertebral artery at the level of the skull base. 10. Moderate/severe focal stenosis within the P2 left PCA. 11. High-grade focal stenosis within the left PCA at the P3/P4 junction. 12. Sites of moderate stenosis within the P2 right PCA. 13. High-grade stenoses within the right PCA at the P3/P4 junction. CT perfusion head: The perfusion software identifies no core infarct. The perfusion software identifies no critically hypoperfused parenchyma utilizing the Tmax >6 seconds threshold. However, there is a 5 mL region within the left frontal lobe demonstrating a Tmax >4 seconds within the left MCA vascular territory. Electronically Signed   By: KylKellie Simmering   On: 07/04/2020 21:24   CT ANGIO NECK CODE STROKE  Result Date: 07/04/2020 CLINICAL DATA:  Aphasia.  Code stroke. EXAM: CT ANGIOGRAPHY HEAD AND NECK CT PERFUSION BRAIN TECHNIQUE: Multidetector CT imaging of the head and neck was performed using the standard protocol during bolus administration of intravenous contrast. Multiplanar CT image reconstructions and MIPs were obtained to evaluate the vascular anatomy. Carotid stenosis measurements (when applicable) are obtained utilizing NASCET criteria, using the distal internal carotid diameter as the denominator. Multiphase CT imaging of the brain was performed following IV bolus contrast injection. Subsequent parametric perfusion maps were calculated using RAPID software. CONTRAST:  666m53mNIPAQUE IOHEXOL 350 MG/ML SOLN COMPARISON:  Noncontrast head CT performed earlier the same day 07/04/2020. FINDINGS: CTA NECK FINDINGS Aortic arch: Standard aortic branching. Atherosclerotic plaque within the visualized aortic arch and proximal major branch vessels of the neck. No hemodynamically significant innominate or proximal subclavian artery stenosis. Right carotid system: CCA and ICA patent within the neck without significant stenosis (50% or greater). Mild mixed plaque within the carotid bifurcation Left carotid  system: CCA and ICA patent within the neck without significant stenosis (50% or greater). Minimal mixed plaque within the CCA and carotid bifurcation. Vertebral arteries: Codominant patent within the neck bilaterally. Moderate atherosclerotic narrowing at the origin of the left vertebral artery. Skeleton: No acute bony abnormality Other neck: No neck mass or cervical lymphadenopathy. Subcentimeter right thyroid lobe nodule not meeting consensus criteria for  ultrasound follow-up. Upper chest: No consolidation within the imaged lung apices Review of the MIP images confirms the above findings CTA HEAD FINDINGS Anterior circulation: The intracranial internal carotid arteries are patent. Calcified plaque within both vessels. Moderate stenosis of the supraclinoid left ICA. Otherwise, no more than mild narrowing of these vessels. The M1 middle cerebral arteries are patent. There is a high-grade focal stenosis within the mid M1 left MCA. Atherosclerotic irregularity of the M2 and more distal MCA branch vessels bilaterally. Most notably, there is a high-grade focal stenosis within a proximal M2 left MCA branch vessel (series 9, image 28). Additionally, there is a high-grade stenosis within a mid M2 MCA branch vessel on the right (series 9, image 15). No proximal branch occlusion is identified. The anterior cerebral arteries are patent. There is atherosclerotic irregularity of both anterior cerebral arteries. Most notably, there is a high-grade focal stenosis within the proximal A2 right anterior cerebral artery (series 4, image 92). Additionally, there is a high-grade focal stenosis within the left anterior cerebral artery at the A2/A3 junction. No intracranial aneurysm is identified. Posterior circulation: The intracranial vertebral arteries are patent. There is a moderate/severe focal stenosis within the V4 right vertebral artery at the level of the skull base. The basilar artery is patent without significant stenosis.  Fetal origin left posterior cerebral artery. There is a moderate/severe focal stenosis within the P2 left PCA (series 9, image 24). High-grade focal stenosis within the left PCA at the P3/P4 junction. Sites of moderate stenosis within the P2 right PCA. Additionally, there are sites of severe stenosis within the right PCA at the P3/P4 junction. The right posterior communicating artery is hypoplastic or absent Venous sinuses: Within limitations of contrast timing, no convincing thrombus. Anatomic variants: As described Review of the MIP images confirms the above findings CT Brain Perfusion Findings: ASPECTS: 10 CBF (<30%) Volume: 61m Perfusion (Tmax>6.0s) volume: 0534m However, there is a 5 mL region within the left frontal lobe, within the left MCA vascular territory, demonstrating a Tmax greater than 4 seconds. Mismatch Volume: 34m36mnfarction Location:None identified These results were called by telephone at the time of interpretation on 07/04/2020 at 9:24 pm to provider Dr. GolRegenia Skeeterho verbally acknowledged these results. IMPRESSION: CTA neck: 1. The bilateral common and internal carotid arteries are patent within the neck without hemodynamically significant stenosis. 2. The vertebral arteries are patent within the neck bilaterally. Moderate atherosclerotic narrowing at the origin of the left vertebral artery. CTA head: 1. No intracranial large vessel occlusion. 2. Intracranial atherosclerotic disease with multifocal stenoses, most notably as follows. 3. High-grade focal stenosis within the M1 left middle cerebral artery. 4. High-grade focal stenosis within a proximal M2 left MCA branch vessel. 5. High-grade focal stenosis within a mid M2 right MCA branch vessel. 6. High-grade focal stenosis within the proximal A2 right ACA. 7. High-grade focal stenosis within the left ACA at the A2/A3 junction. 8. Moderate stenosis of the supraclinoid left ICA. 9. Moderate/severe focal stenosis within the right vertebral artery at  the level of the skull base. 10. Moderate/severe focal stenosis within the P2 left PCA. 11. High-grade focal stenosis within the left PCA at the P3/P4 junction. 12. Sites of moderate stenosis within the P2 right PCA. 13. High-grade stenoses within the right PCA at the P3/P4 junction. CT perfusion head: The perfusion software identifies no core infarct. The perfusion software identifies no critically hypoperfused parenchyma utilizing the Tmax >6 seconds threshold. However, there is a 5 mL region within the left frontal  lobe demonstrating a Tmax >4 seconds within the left MCA vascular territory. Electronically Signed   By: Kellie Simmering DO   On: 07/04/2020 21:24     ASSESSMENT:  1. 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 -CTAP on 04/06/2020 showed circumferential ascending colon mass with numerous borderline enlarged pericolonic lymph nodes. No findings of hepatic metastatic disease. -CEA on 04/04/2020 was 12.2. CEA improved to 1.7 on 05/13/2020. -PET scan on 05/11/2020 shows mild FDG uptake associated with the anastomotic site. Small pulmonary nodules that do not show elevated FDG activity, remain nonspecific, subcentimeter. Persistent but improved bladder wall thickening. -Cycle 1 of dose reduced FOLFOX on 05/19/2020, followed by 2 hospitalizations, 1 from acute kidney injury from diarrhea and second admission for C. difficile colitis. -Cycle 2 of 5-FU and leucovorin dose reduced on 06/30/2020.  2. Family history: -Paternal uncle had colon cancer. Maternal aunt had brain cancer and another maternal aunt had gynecological malignancy. Father had lung cancer and was a smoker.  3. Diffuse erythema and nodularity of the dome of the bladder: -CT scan showed severely thickened bladder  wall. -Cystoscopy on 04/08/2020 showed diffuse erythema, nodularity in the posterior wall tracking to the dome. Ureteral orifices were in normal locations. Biopsies were benign.   PLAN:  1. Stage IIIb (T4AN1A) poorly differentiated right colon adenocarcinoma: -She could not tolerate even dose reduced 5-FU and leucovorin.  He was hospitalized and had developed resurgence of C. difficile infection.  She is currently taking vancomycin p.o. -We have discussed about discontinuing adjuvant chemotherapy at this time due to poor tolerability. -We have recommended aggressive follow-ups.  We will send guardant reveal test along with CEA level today.  We discussed aggressive monitoring with blood work every 3 months and scans every 6 months. -We will schedule her for phone visit in 2 weeks for follow-up on the results.  2. Normocytic anemia: -Combination anemia from CKD and iron deficiency.  Last Feraheme was on 06/22/2020. -Hemoglobin today is 11.5.  3. Family history: -Foundation 1 test showed BRAF V600 mutation.  Most likely her cancer is sporadic.  However given her family history, we will make a referral for genetics evaluation.  4. Diffuse erythema nodularity of the dome of the bladder: -Denies any further hematuria.   Orders placed this encounter:  No orders of the defined types were placed in this encounter.    Derek Jack, MD [place] [phone]   I, Milinda Antis, am acting as a scribe for Dr. Sanda Linger.  I, Derek Jack MD, have reviewed the above documentation for accuracy and completeness, and I agree with the above.

## 2020-08-03 NOTE — Patient Instructions (Signed)
[place] at [facility] Discharge Instructions  You were seen today by Dr. Delton Coombes. He went over your recent results. You had labs drawn today for further analysis. You will be referred to a geneticist for genetic analysis. Dr. Delton Coombes will contact you via phone in 2 weeks for follow up.   Thank you for choosing [facility] at [facility] to provide your oncology and hematology care.  To afford each patient quality time with our provider, please arrive at least 15 minutes before your scheduled appointment time.   If you have a lab appointment with the [facility] please come in thru the Main Entrance and check in at the main information desk  You need to re-schedule your appointment should you arrive 10 or more minutes late.  We strive to give you quality time with our providers, and arriving late affects you and other patients whose appointments are after yours.  Also, if you no show three or more times for appointments you may be dismissed from the clinic at the providers discretion.     Again, thank you for choosing [facility].  Our hope is that these requests will decrease the amount of time that you wait before being seen by our physicians.       _____________________________________________________________  Should you have questions after your visit to [facility], please contact our office at [phone] between the hours of 8:00 a.m. and 4:30 p.m.  Voicemails left after 4:00 p.m. will not be returned until the following business day.  For prescription refill requests, have your pharmacy contact our office and allow 72 hours.    Cancer Center Support Programs:   > Cancer Support Group  2nd Tuesday of the month 1pm-2pm, Journey Room

## 2020-08-04 ENCOUNTER — Telehealth: Payer: Self-pay | Admitting: *Deleted

## 2020-08-04 LAB — CEA: CEA: 4.5 ng/mL (ref 0.0–4.7)

## 2020-08-04 NOTE — Telephone Encounter (Signed)
Pt aware.

## 2020-08-04 NOTE — Telephone Encounter (Signed)
No.  If she is asymptomatic no need to retest

## 2020-08-04 NOTE — Telephone Encounter (Signed)
Pt finishes vancomycin tomorrow, does she need to recheck stool s

## 2020-08-05 ENCOUNTER — Encounter: Payer: Self-pay | Admitting: Genetic Counselor

## 2020-08-06 ENCOUNTER — Encounter (INDEPENDENT_AMBULATORY_CARE_PROVIDER_SITE_OTHER): Payer: Medicare HMO | Admitting: Ophthalmology

## 2020-08-06 ENCOUNTER — Encounter: Payer: Medicare HMO | Admitting: Genetic Counselor

## 2020-08-13 NOTE — Progress Notes (Signed)
[facility] Note  08/17/2020     CHIEF COMPLAINT Patient presents for Retina Follow Up   HISTORY OF PRESENT ILLNESS: Gabrielle [name] is a 74 y.o. female who presents to the clinic today for:   HPI    Retina Follow Up    Patient presents with  Wet AMD.  In both eyes.  This started 6.  Severity is moderate.  I, the attending physician,  performed the HPI with the patient and updated documentation appropriately.          Comments    Patient here for 6 weeks retina follow up for exu ARMD OU. Patent states vision seems to be a little bit worse. Had a stroke/TIA 4 -5 weeks ago. Only took a day to be able to talk and get sentences together.       Last edited by Bernarda Caffey, MD on 08/17/2020 12:16 PM. (History)    pt states since she was here last she has had a stroke, she states she went to sleep and woke up and couldn't speak, pt is having a IVC filter put on her heart, pt was not put on blood thinners   Referring physician: Thalia Bloodgood, OD No address on file  HISTORICAL INFORMATION:   Selected notes from the MEDICAL RECORD NUMBER Referred by Dr. Cristela Blue for concern of ARMD OU;  LEE- 01.03.19 (WSherlean Foot) [BCVA OD: 10/50 OS: 20/80] Ocular Hx- ARMD OU, DME PMH- elevated chol., HTN    CURRENT MEDICATIONS: Current Outpatient Medications (Ophthalmic Drugs)  Medication Sig  . dorzolamide-timolol (COSOPT) 22.3-6.8 MG/ML ophthalmic solution PLACE 1 DROP INTO BOTH EYES 2 TIMES A DAY   No current facility-administered medications for this visit. (Ophthalmic Drugs)   Current Outpatient Medications (Other)  Medication Sig  . alendronate (FOSAMAX) 70 MG tablet Take 1 tablet (70 mg total) by mouth every 7 (seven) days. Take with a full glass of water on an empty stomach.  Marland Kitchen amLODipine (NORVASC) 5 MG tablet Take 1 tablet (5 mg total) by mouth daily.  Marland Kitchen atorvastatin (LIPITOR) 20 MG tablet Take 1 tablet (20 mg total) by mouth daily.  . cholecalciferol  (VITAMIN D3) 25 MCG (1000 UT) tablet Take 1,000 Units by mouth daily.  Marland Kitchen CRANBERRY FRUIT PO Take 1 tablet by mouth daily.  . diclofenac Sodium (VOLTAREN) 1 % GEL Apply 2 g topically 4 (four) times daily.   . diphenoxylate-atropine (LOMOTIL) 2.5-0.025 MG tablet Take 1 tablet by mouth 4 (four) times daily as needed for diarrhea or loose stools. (Patient not taking: Reported on 08/03/2020)  . famotidine (PEPCID) 20 MG tablet Take 1 tablet (20 mg total) by mouth 2 (two) times daily as needed for heartburn or indigestion.  . ferrous sulfate 325 (65 FE) MG EC tablet Take 1 tablet (325 mg total) by mouth 2 (two) times daily with a meal.  . fluconazole (DIFLUCAN) 150 MG tablet Take by mouth. (Patient not taking: Reported on 08/03/2020)  . LEUCOVORIN CALCIUM IV Inject into the vein every 14 (fourteen) days. (Patient not taking: Reported on 07/15/2020)  . lidocaine-prilocaine (EMLA) cream Apply a small amount to port a cath site and cover with plastic wrap 1 hour prior to chemotherapy appointments  . losartan-hydrochlorothiazide (HYZAAR) 100-25 MG tablet Take 1 tablet by mouth daily.  . OXALIPLATIN IV Inject into the vein every 14 (fourteen) days. (Patient not taking: Reported on 07/15/2020)  . potassium chloride SA (KLOR-CON) 20 MEQ tablet Take 1 tablet (20 mEq  total) by mouth daily.  . prochlorperazine (COMPAZINE) 10 MG tablet Take 1 tablet (10 mg total) by mouth every 6 (six) hours as needed for nausea or vomiting. (Patient not taking: Reported on 07/15/2020)  . saccharomyces boulardii (FLORASTOR) 250 MG capsule Take by mouth.   No current facility-administered medications for this visit. (Other)      REVIEW OF SYSTEMS: ROS    Positive for: Gastrointestinal, Musculoskeletal, Eyes   Negative for: Constitutional, Neurological, Skin, Genitourinary, HENT, Endocrine, Cardiovascular, Respiratory, Psychiatric, Allergic/Imm, Heme/Lymph   Last edited by Theodore Demark, COA on 08/17/2020  9:58 AM. (History)        ALLERGIES Allergies  Allergen Reactions  . Lisinopril Cough    PAST MEDICAL HISTORY Past Medical History:  Diagnosis Date  . Arthritis   . Cancer ([place])   . Cataract    OU  . Colon cancer ([place])   . H/O cesarean section   . Hx of tonsillectomy   . Hyperlipidemia   . Hypertension   . Macular degeneration    Exu ARMD OU  . Paroxysmal atrial fibrillation ([place]) 07/05/2020   Past Surgical History:  Procedure Laterality Date  . BIOPSY  04/06/2020   Procedure: BIOPSY;  Surgeon: Rogene Houston, MD;  [facility];  Service: Endoscopy;;  . CARPAL TUNNEL RELEASE Right   . CATARACT EXTRACTION W/PHACO Left 10/11/2019   Procedure: CATARACT EXTRACTION PHACO AND INTRAOCULAR LENS PLACEMENT (IOC);  Surgeon: Baruch Goldmann, MD;  Location: [facility];  Service: Ophthalmology;  Laterality: Left;  CDE: 8.56  . CATARACT EXTRACTION W/PHACO Right 10/25/2019   Procedure: CATARACT EXTRACTION PHACO AND INTRAOCULAR LENS PLACEMENT (IOC);  Surgeon: Baruch Goldmann, MD;  Location: [facility];  Service: Ophthalmology;  Laterality: Right;  CDE: 5.67  . CESAREAN SECTION    . COLONOSCOPY N/A 04/06/2020   Procedure: COLONOSCOPY;  Surgeon: Rogene Houston, MD;  Location: [facility];  Service: Endoscopy;  Laterality: N/A;  . CYSTOSCOPY WITH BIOPSY N/A 04/08/2020   Procedure: CYSTOSCOPY WITH BIOPSY;  Surgeon: Cleon Gustin, MD;  Location: [facility];  Service: Urology;  Laterality: N/A;  . ESOPHAGOGASTRODUODENOSCOPY N/A 04/05/2020   Procedure: ESOPHAGOGASTRODUODENOSCOPY (EGD);  Surgeon: Rogene Houston, MD;  [facility];  Service: Endoscopy;  Laterality: N/A;  . PARTIAL COLECTOMY N/A 04/08/2020   Procedure: PARTIAL COLECTOMY;  Surgeon: Virl Cagey, MD;  Location: [facility];  Service: General;  Laterality: N/A;  . PORTACATH PLACEMENT Left 05/18/2020   Procedure: INSERTION PORT-A-CATH (ATTACHED CATHETER IN LEFT SUBCLAVIAN);  Surgeon: Virl Cagey, MD;  Location: [facility];  Service: General;   Laterality: Left;  . TONSILLECTOMY      FAMILY HISTORY Family History  Problem Relation Age of Onset  . Macular degeneration Mother   . Stroke Mother   . Hypertension Mother   . Dementia Mother   . Lung cancer Father        smoker  . Diabetes Sister   . Hypertension Sister   . Colon cancer Paternal Uncle   . Brain cancer Maternal Aunt   . Ovarian cancer Maternal Aunt     SOCIAL HISTORY Social History   Tobacco Use  . Smoking status: Never Smoker  . Smokeless tobacco: Never Used  Vaping Use  . Vaping Use: Never used  Substance Use Topics  . Alcohol use: No  . Drug use: No         OPHTHALMIC EXAM:  Base Eye Exam    Visual Acuity (Snellen - Linear)  Right Left   Dist cc CF at 3' 20/80 -2   Dist ph cc NI 20/80 +2   Correction: Glasses       Tonometry (Tonopen, 9:54 AM)      Right Left   Pressure 15 14       Pupils      Dark Light Shape React APD   Right 4 3 Round Brisk None   Left 4 3 Round Brisk None       Visual Fields (Counting fingers)      Left Right    Full Full       Extraocular Movement      Right Left    Full, Ortho Full, Ortho       Neuro/Psych    Oriented x3: Yes   Mood/Affect: Normal       Dilation    Both eyes: 1.0% Mydriacyl, 2.5% Phenylephrine @ 9:54 AM        Slit Lamp and Fundus Exam    Slit Lamp Exam      Right Left   Lids/Lashes Dermatochalasis - upper lid Dermatochalasis - upper lid   Conjunctiva/Sclera White and quiet White and quiet   Cornea Arcus, 1-2+ Punctate epithelial erosions Arcus, 1-2+ Punctate epithelial erosions   Anterior Chamber Deep and quiet Deep and quiet   Iris Round and well dilated Round and well dilated   Lens PCIOL in good position PCIOL in good position   Vitreous Vitreous syneresis, Posterior vitreous detachment Vitreous syneresis, Posterior vitreous detachment       Fundus Exam      Right Left   Disc pink and sharp, compact pink and sharp, Compact, vascular loops superiorly   C/D  Ratio 0.2 0.2   Macula Blunted foveal reflex, Drusen, Pigment clumping and atrophy, central thickening/disciform scar, +PED, no heme, persistent IRF/cystic changes overlying central scar Blunted foveal reflex, central thickening, RPE clumping and atrophy, Drusen, RPE rip, mild IRF/SRF overlying PED -- persistent   Vessels Vascular attenuation, Tortuous Vascular attenuation, Tortuous   Periphery Attached, mild Reticular degeneration Attached, mild Reticular degeneration        Refraction    Wearing Rx      Sphere Cylinder Add   Right [place] Sphere +3.50   Left -0.50 Sphere +3.50   Type: PAL          IMAGING AND PROCEDURES  Imaging and Procedures for 03/21/18  OCT, Retina - OU - Both Eyes       Right Eye Quality was good. Central Foveal Thickness: 510. Progression has been stable. Findings include pigment epithelial detachment, epiretinal membrane, subretinal hyper-reflective material, retinal drusen , abnormal foveal contour, no SRF, intraretinal fluid, disciform scar, outer retinal tubulation, outer retinal atrophy (Stable sub-retinal scar, persistent IRF overlying SRHM).   Left Eye Quality was good. Central Foveal Thickness: 349. Progression has been stable. Findings include subretinal hyper-reflective material, pigment epithelial detachment, intraretinal fluid, retinal drusen , abnormal foveal contour, no SRF, outer retinal atrophy (persistent IRF overlying SRHM/PED).   Notes *Images captured and stored on drive  Diagnosis / Impression:  Exudative ARMD OU OD: Stable sub-retinal scar, persistent IRF overlying SRHM OS: persistent IRF overlying SRHM/PED  Clinical management:  See below  Abbreviations: NFP - Normal foveal profile. CME - cystoid macular edema. PED - pigment epithelial detachment. IRF - intraretinal fluid. SRF - subretinal fluid. EZ - ellipsoid zone. ERM - epiretinal membrane. ORA - outer retinal atrophy. ORT - outer retinal tubulation. SRHM - subretinal  hyper-reflective material  ASSESSMENT/PLAN:    ICD-10-CM   1. Exudative age-related macular degeneration of both eyes with active choroidal neovascularization ([place])  H35.3231   2. Retinal edema  H35.81 OCT, Retina - OU - Both Eyes  3. Posterior vitreous detachment of both eyes  H43.813   4. Pseudophakia of both eyes  Z96.1   5. Ocular hypertension, bilateral  H40.053   6. Combined form of age-related cataract, both eyes  H25.813     1,2. Exudative age related macular degeneration, both eyes.    - severe exudative disease with very active CNVM OU at presentation in January 2019  - S/P IVA OD #1 (01.04.19), #2 (02.15.19)  - S/P IVA OS #1 (01.18.19), #2 (02.15.19)  - switched to Eylea 3.18.19 due to severity of disease  - S/P IVE OD #1 (03.18.19), #2 (04.17.19), #3 (05.15.19) -- injections held due to stable disciform scar, #4 (10.02.19),  #5 (01.15.20), #6 (06.04.20), #7 (10.29.20), #8 (01.21.21), #9 (05.27.21), #10 (07.22.21)  - S/P IVE OS #1 (03.18.19), #2 (04.17.19), #3 (05.15.19), #4 (06.12.19), #5 (07.10.19), #6 (08.07.19), #7 (09.04.19), #8 (10.02.19), #9 (11.06.19), #10 (12.11.19), #11 (01.15.20), #12 (02.20.20), #13 (03.26.20), #14 (04.30.20), #15 (06.04.20), #16 (07.16.20), # 17 (08.20.20), #18 (09.24.20), #19 (10.29.20), #20 (12.03.20), #21 (01.21.21), #22 (03.18.21), #23 (05.27.21), #24 (07.22.21)  - OCT OS today shows persistent IRF overlying PED  - OD with significant subretinal disciform scar - persistent IRF overlying IRHM  - BCVA OD CF 3' from 20/150 in May 2021; OS 20/80 - stable  - recommend holding IVE OU today due to pts recent stroke/TIA on 08.01.21 -- had [facility] informed consent form signed and scanned on 01.21.2021  - Eylea4U paperwork filled out on 02.15.19 and fully approved through [employer] -- approved  - f/u in 2 months, sooner prn for DFE/OCT/possible injection OS  3. PVD / vitreous syneresis  - Discussed  findings and prognosis  - No RT or RD on 360  exam  - Reviewed s/s of RT/RD  - Strict return precautions for any such RT/RD signs/symptoms  4. Pseudophakia OU  - s/p CE/IOL (Dr. Marisa Hua, 11.2020)  - beautiful surgeries w/ IOLs in excellent position, doing well  - monitor  5. Ocular Hypertension OU  - IOP okay today -- 15 OD, 14 OS  - cont Cosopt qdaily OU   Ophthalmic Meds Ordered this visit:  No orders of the defined types were placed in this encounter.      Return in about 2 months (around 10/17/2020) for f/u exu ARMD OU, DFE, OCT.  There are no Patient Instructions on file for this visit.  This document serves as a record of services personally performed by Gardiner Sleeper, MD, PhD. It was created on their behalf by Leeann Must, [place], an ophthalmic technician. The creation of this record is the provider's dictation and/or activities during the visit.    Electronically signed by: Leeann Must, [place] 09.09.2021 12:24 PM   This document serves as a record of services personally performed by Gardiner Sleeper, MD, PhD. It was created on their behalf by San Jetty. Owens Shark, OA an ophthalmic technician. The creation of this record is the provider's dictation and/or activities during the visit.    Electronically signed by: San Jetty. Marguerita Merles 09.13.2021 12:24 PM  Gardiner Sleeper, M.D., Ph.D. Diseases & Surgery of the Retina and [place] 08/17/2020   I have reviewed the above documentation for accuracy and  completeness, and I agree with the above. Gardiner Sleeper, M.D., Ph.D. 08/17/20 12:24 PM  Abbreviations: M myopia (nearsighted); A astigmatism; H hyperopia (farsighted); P presbyopia; Mrx spectacle prescription;  CTL contact lenses; OD right eye; OS left eye; OU both eyes  XT exotropia; ET esotropia; PEK punctate epithelial keratitis; PEE punctate epithelial erosions; DES dry eye syndrome; MGD meibomian gland dysfunction; ATs artificial tears;  PFAT's preservative free artificial tears; [place] nuclear sclerotic cataract; PSC posterior subcapsular cataract; ERM epi-retinal membrane; PVD posterior vitreous detachment; RD retinal detachment; DM diabetes mellitus; DR diabetic retinopathy; NPDR non-proliferative diabetic retinopathy; PDR proliferative diabetic retinopathy; CSME clinically significant macular edema; DME diabetic macular edema; dbh dot blot hemorrhages; CWS cotton wool spot; POAG primary open angle glaucoma; C/D cup-to-disc ratio; HVF humphrey visual field; GVF goldmann visual field; OCT optical coherence tomography; IOP intraocular pressure; BRVO Branch retinal vein occlusion; CRVO central retinal vein occlusion; CRAO central retinal artery occlusion; BRAO branch retinal artery occlusion; RT retinal tear; SB scleral buckle; PPV pars plana vitrectomy; VH Vitreous hemorrhage; PRP panretinal laser photocoagulation; IVK intravitreal kenalog; VMT vitreomacular traction; MH Macular hole;  NVD neovascularization of the disc; NVE neovascularization elsewhere; AREDS age related eye disease study; ARMD age related macular degeneration; POAG primary open angle glaucoma; EBMD epithelial/anterior basement membrane dystrophy; ACIOL anterior chamber intraocular lens; IOL intraocular lens; PCIOL posterior chamber intraocular lens; Phaco/IOL phacoemulsification with intraocular lens placement; [place] photorefractive keratectomy; LASIK laser assisted in situ keratomileusis; HTN hypertension; DM diabetes mellitus; COPD chronic obstructive pulmonary disease

## 2020-08-14 ENCOUNTER — Other Ambulatory Visit: Payer: Medicare HMO

## 2020-08-14 ENCOUNTER — Other Ambulatory Visit: Payer: Self-pay | Admitting: *Deleted

## 2020-08-14 ENCOUNTER — Other Ambulatory Visit: Payer: Self-pay | Admitting: Family Medicine

## 2020-08-14 ENCOUNTER — Other Ambulatory Visit: Payer: Self-pay

## 2020-08-14 DIAGNOSIS — A0472 Enterocolitis due to Clostridium difficile, not specified as recurrent: Secondary | ICD-10-CM

## 2020-08-14 NOTE — Addendum Note (Signed)
Addended by: Janora Norlander on: 08/14/2020 01:38 PM   Modules accepted: Orders

## 2020-08-16 DIAGNOSIS — C189 Malignant neoplasm of colon, unspecified: Secondary | ICD-10-CM | POA: Diagnosis not present

## 2020-08-17 ENCOUNTER — Encounter (HOSPITAL_COMMUNITY): Payer: Self-pay

## 2020-08-17 ENCOUNTER — Ambulatory Visit (INDEPENDENT_AMBULATORY_CARE_PROVIDER_SITE_OTHER): Payer: Medicare HMO | Admitting: Ophthalmology

## 2020-08-17 ENCOUNTER — Other Ambulatory Visit: Payer: Self-pay

## 2020-08-17 ENCOUNTER — Encounter (INDEPENDENT_AMBULATORY_CARE_PROVIDER_SITE_OTHER): Payer: Self-pay | Admitting: Ophthalmology

## 2020-08-17 DIAGNOSIS — H3581 Retinal edema: Secondary | ICD-10-CM

## 2020-08-17 DIAGNOSIS — H25813 Combined forms of age-related cataract, bilateral: Secondary | ICD-10-CM

## 2020-08-17 DIAGNOSIS — H43813 Vitreous degeneration, bilateral: Secondary | ICD-10-CM | POA: Diagnosis not present

## 2020-08-17 DIAGNOSIS — Z961 Presence of intraocular lens: Secondary | ICD-10-CM

## 2020-08-17 DIAGNOSIS — H353231 Exudative age-related macular degeneration, bilateral, with active choroidal neovascularization: Secondary | ICD-10-CM

## 2020-08-17 DIAGNOSIS — H40053 Ocular hypertension, bilateral: Secondary | ICD-10-CM

## 2020-08-18 ENCOUNTER — Inpatient Hospital Stay (HOSPITAL_COMMUNITY): Payer: Medicare HMO | Attending: Hematology | Admitting: Hematology

## 2020-08-18 DIAGNOSIS — C182 Malignant neoplasm of ascending colon: Secondary | ICD-10-CM | POA: Diagnosis not present

## 2020-08-18 LAB — C DIFFICILE TOXINS A+B W/RFLX: C difficile Toxins A+B, EIA: NEGATIVE

## 2020-08-18 LAB — C DIFFICILE, CYTOTOXIN B

## 2020-08-18 NOTE — Progress Notes (Signed)
Virtual Visit via Telephone Note  I connected with Gabrielle [name] on 08/18/20 at  4:15 PM EDT by telephone and verified that I am speaking with the correct person using two identifiers.   I discussed the limitations, risks, security and privacy concerns of performing an evaluation and management service by telephone and the availability of in person appointments. I also discussed with the patient that there may be a patient responsible charge related to this service. The patient expressed understanding and agreed to proceed.   History of Present Illness: She is seen in our clinic for history of [name] poorly differentiated right colon adenocarcinoma.  Right hemicolectomy was on 04/07/2020.  She had tried 1 cycle of FOLFOX could not tolerate it.  Second cycle was given with 5-FU and leucovorin, again could not be tolerated.  Unfortunately she developed a recurrent C. difficile colitis and was admitted to the hospital.   Observations/Objective: She is doing very well at this time.  I have talked to her daughter today.  She is walking around her house.  Diarrhea has also improved.  She apparently had C. difficile testing again.  Assessment and Plan:  1.  [name] ([id number]) poorly differentiated right colon adenocarcinoma: -Loss of MLH1 and PMS2, MSI high. -Foundation 1 testing shows BRAF V600 E mutation. -Guardant reveal test was positive for cell free DNA of the tumor.  This portends high risk of recurrence without treatment.  Unfortunately patient could not tolerate any treatment. -We will do close monitoring for recurrence.  I plan to see her back in 2 months with repeat labs and a CAT scan of the chest, abdomen/ pelvis. -She has a geneticist appointment pending. -She was told to come back sooner should she develop any new pains, change in bowel habits or bleeding.   Follow Up Instructions: RTC 2 months with labs and scan.   I discussed the assessment and treatment plan with the  patient. The patient was provided an opportunity to ask questions and all were answered. The patient agreed with the plan and demonstrated an understanding of the instructions.   The patient was advised to call back or seek an in-person evaluation if the symptoms worsen or if the condition fails to improve as anticipated.  I provided 11 minutes of non-face-to-face time during this encounter.   Derek Jack, MD

## 2020-08-19 ENCOUNTER — Telehealth: Payer: Self-pay | Admitting: Family Medicine

## 2020-08-19 ENCOUNTER — Encounter: Payer: Medicare HMO | Admitting: Genetic Counselor

## 2020-08-19 NOTE — Telephone Encounter (Signed)
Patient of Dr Lajuana Ripple. Can last lab results be reviewed so patient can be notified

## 2020-08-20 ENCOUNTER — Ambulatory Visit (HOSPITAL_BASED_OUTPATIENT_CLINIC_OR_DEPARTMENT_OTHER): Payer: Medicare HMO | Admitting: Genetic Counselor

## 2020-08-20 ENCOUNTER — Other Ambulatory Visit: Payer: Self-pay | Admitting: Family Medicine

## 2020-08-20 DIAGNOSIS — A0472 Enterocolitis due to Clostridium difficile, not specified as recurrent: Secondary | ICD-10-CM

## 2020-08-20 DIAGNOSIS — Z8049 Family history of malignant neoplasm of other genital organs: Secondary | ICD-10-CM | POA: Diagnosis not present

## 2020-08-20 DIAGNOSIS — Z800 Family history of malignant neoplasm of digestive organs: Secondary | ICD-10-CM

## 2020-08-20 DIAGNOSIS — Z1379 Encounter for other screening for genetic and chromosomal anomalies: Secondary | ICD-10-CM

## 2020-08-20 DIAGNOSIS — C189 Malignant neoplasm of colon, unspecified: Secondary | ICD-10-CM | POA: Diagnosis not present

## 2020-08-20 MED ORDER — FIDAXOMICIN 200 MG PO TABS: 200.0000 mg | ORAL_TABLET | Freq: Two times a day (BID) | ORAL | 0 refills | Status: DC

## 2020-08-20 NOTE — Telephone Encounter (Signed)
Lab resulted and sent to pools to call patient.

## 2020-08-21 ENCOUNTER — Telehealth: Payer: Self-pay | Admitting: Family Medicine

## 2020-08-21 ENCOUNTER — Encounter: Payer: Self-pay | Admitting: Genetic Counselor

## 2020-08-21 DIAGNOSIS — Z1379 Encounter for other screening for genetic and chromosomal anomalies: Secondary | ICD-10-CM | POA: Insufficient documentation

## 2020-08-21 DIAGNOSIS — M48061 Spinal stenosis, lumbar region without neurogenic claudication: Secondary | ICD-10-CM | POA: Diagnosis not present

## 2020-08-21 DIAGNOSIS — M6281 Muscle weakness (generalized): Secondary | ICD-10-CM | POA: Diagnosis not present

## 2020-08-21 DIAGNOSIS — Z800 Family history of malignant neoplasm of digestive organs: Secondary | ICD-10-CM

## 2020-08-21 DIAGNOSIS — A0472 Enterocolitis due to Clostridium difficile, not specified as recurrent: Secondary | ICD-10-CM

## 2020-08-21 DIAGNOSIS — Z8049 Family history of malignant neoplasm of other genital organs: Secondary | ICD-10-CM | POA: Insufficient documentation

## 2020-08-21 DIAGNOSIS — Z85038 Personal history of other malignant neoplasm of large intestine: Secondary | ICD-10-CM | POA: Diagnosis not present

## 2020-08-21 DIAGNOSIS — I69898 Other sequelae of other cerebrovascular disease: Secondary | ICD-10-CM | POA: Diagnosis not present

## 2020-08-21 HISTORY — DX: Family history of malignant neoplasm of digestive organs: Z80.0

## 2020-08-21 HISTORY — DX: Family history of malignant neoplasm of other genital organs: Z80.49

## 2020-08-21 MED ORDER — VANCOMYCIN HCL 125 MG PO CAPS: 125.0000 mg | ORAL_CAPSULE | Freq: Four times a day (QID) | ORAL | 0 refills | Status: AC

## 2020-08-21 NOTE — Telephone Encounter (Signed)
Lmtcb.

## 2020-08-21 NOTE — Progress Notes (Addendum)
REFERRING PROVIDER: Derek Jack, MD [address] [place],  [place] [address]  PRIMARY PROVIDER:  Janora Norlander, DO  PRIMARY REASON FOR VISIT:  1. Malignant neoplasm of colon, unspecified part of colon ([place])   2. Family history of pancreatic cancer   3. Family history of uterine cancer   4. Genetic testing    I connected with Ms. Lepage and her daughter, Ashok Norris, on 08/20/2020 at 2pm EDT by [place] video conference and verified that I was speaking with the correct person using two identifiers.   HISTORY OF PRESENT ILLNESS:   Ms. Salberg, a 74 y.o. female, was seen for a [place] cancer genetics consultation at the request of Dr. Delton Coombes due to a personal history of colon cancer and a family history of gynecological cancer and pancreatic cancer.  Ms. Rutan presents to clinic today to discuss the possibility of a hereditary predisposition to cancer, genetic testing, and to further clarify her future cancer risks, as well as potential cancer risks for family members.   In May 2021, at the age of 89, Ms. Ehresman was diagnosed with adenocarcinoma of the ascending colon with microsatellite instability, loss of MLH1 and PMS2 by immunohistochemistry, and BRAF [id number]. The treatment plan includes chemotherapy.   CANCER HISTORY:  Oncology History  Malignant neoplasm of colon ([place])  04/07/2020 Initial Diagnosis   Malignant neoplasm of ascending colon ([place])   05/13/2020 Genetic Testing   [facility]     05/19/2020 -  Chemotherapy   The patient had palonosetron (ALOXI) injection 0.25 mg, 0.25 mg, Intravenous,  Once, 2 of 12 cycles Administration: 0.25 mg (05/19/2020), 0.25 mg (06/30/2020) leucovorin 522 mg in dextrose 5 % 250 mL infusion, 320 mg/m2 = 522 mg (80 % of original dose 400 mg/m2), Intravenous,  Once, 2 of 12 cycles Dose modification: 320 mg/m2 (80 % of original dose 400 mg/m2, Cycle 1, Reason: Provider Judgment), [phone] mg/m2 (66.7 % of original dose 400 mg/m2, Cycle 2,  Reason: Provider Judgment) Administration: 522 mg (05/19/2020), 434 mg (06/30/2020) oxaliplatin (ELOXATIN) 110 mg in dextrose 5 % 500 mL chemo infusion, 68 mg/m2 = 110 mg (80 % of original dose 85 mg/m2), Intravenous,  Once, 1 of 1 cycle Dose modification: 68 mg/m2 (80 % of original dose 85 mg/m2, Cycle 1, Reason: Provider Judgment) Administration: 110 mg (05/19/2020) fluorouracil (ADRUCIL) chemo injection 500 mg, 320 mg/m2 = 500 mg (80 % of original dose 400 mg/m2), Intravenous,  Once, 2 of 12 cycles Dose modification: 320 mg/m2 (80 % of original dose 400 mg/m2, Cycle 1, Reason: Provider Judgment), [id number] mg/m2 (66.7 % of original dose 400 mg/m2, Cycle 2, Reason: Provider Judgment) Administration: 500 mg (05/19/2020), 450 mg (06/30/2020) fluorouracil (ADRUCIL) 3,150 mg in sodium chloride 0.9 % 87 mL chemo infusion, 1,920 mg/m2 = 3,150 mg (80 % of original dose 2,400 mg/m2), Intravenous, 1 Day/Dose, 2 of 12 cycles Dose modification: 1,920 mg/m2 (80 % of original dose 2,400 mg/m2, Cycle 1, Reason: Provider Judgment), 1,600 mg/m2 (66.7 % of original dose 2,400 mg/m2, Cycle 2, Reason: Provider Judgment) Administration: 3,150 mg (05/19/2020), 2,600 mg (06/30/2020)  for chemotherapy treatment.    08/03/2020 Genetic Testing   [facility] Reveal Testing     08/14/2020 Genetic Testing   No pathogenic variants detected in [facility] Common Hereditary Cancers Panel.  Variant of uncertain significance (VUS) detected in HOXB13 at c.634G>A (p.Ala212Thr). The Common Hereditary Cancers Panel offered by [facility] includes sequencing and/or deletion duplication testing of the following 48 genes: APC, ATM, AXIN2, BARD1, BMPR1A,  BRCA1, BRCA2, BRIP1, CDH1, CDK4, CDKN2A (p14ARF), CDKN2A (p16INK4a), CHEK2, CTNNA1, DICER1, EPCAM (Deletion/duplication testing only), GREM1 (promoter region deletion/duplication testing only), KIT, MEN1, MLH1, MSH2, MSH3, MSH6, MUTYH, NBN, NF1, NHTL1, PALB2, PDGFRA, PMS2, POLD1, POLE, PTEN, RAD50, RAD51C,  RAD51D, RNF43, SDHB, SDHC, SDHD, SMAD4, SMARCA4. STK11, TP53, TSC1, TSC2, and VHL.  The following genes were evaluated for sequence changes only: SDHA and HOXB13 c.251G>A variant only. The report date is August 14, 2020.       RISK FACTORS:  Menarche was at age 43.  First live birth at age 54.  OCP use for approximately 5 years.  Ovaries intact: yes.  Hysterectomy: no.  Menopausal status: postmenopausal.  HRT use: 0 years. Colonoscopy: see history noted above. Mammogram within the last year: most recent in 2019. Number of breast biopsies: 0. Up to date with pelvic exams: no. Any excessive radiation exposure in the past: no  Past Medical History:  Diagnosis Date  . Arthritis   . Cancer ([place])   . Cataract    OU  . Colon cancer ([place])   . Family history of pancreatic cancer 08/21/2020  . Family history of uterine cancer 08/21/2020  . H/O cesarean section   . Hx of tonsillectomy   . Hyperlipidemia   . Hypertension   . Macular degeneration    Exu ARMD OU  . Paroxysmal atrial fibrillation ([place]) 07/05/2020    Past Surgical History:  Procedure Laterality Date  . BIOPSY  04/06/2020   Procedure: BIOPSY;  Surgeon: Rogene Houston, MD;  [facility];  Service: Endoscopy;;  . CARPAL TUNNEL RELEASE Right   . CATARACT EXTRACTION W/PHACO Left 10/11/2019   Procedure: CATARACT EXTRACTION PHACO AND INTRAOCULAR LENS PLACEMENT (IOC);  Surgeon: Baruch Goldmann, MD;  Location: [facility];  Service: Ophthalmology;  Laterality: Left;  CDE: 8.56  . CATARACT EXTRACTION W/PHACO Right 10/25/2019   Procedure: CATARACT EXTRACTION PHACO AND INTRAOCULAR LENS PLACEMENT (IOC);  Surgeon: Baruch Goldmann, MD;  Location: [facility];  Service: Ophthalmology;  Laterality: Right;  CDE: 5.67  . CESAREAN SECTION    . COLONOSCOPY N/A 04/06/2020   Procedure: COLONOSCOPY;  Surgeon: Rogene Houston, MD;  [facility];  Service: Endoscopy;  Laterality: N/A;  . CYSTOSCOPY WITH BIOPSY N/A 04/08/2020    Procedure: CYSTOSCOPY WITH BIOPSY;  Surgeon: Cleon Gustin, MD;  Location: [facility];  Service: Urology;  Laterality: N/A;  . ESOPHAGOGASTRODUODENOSCOPY N/A 04/05/2020   Procedure: ESOPHAGOGASTRODUODENOSCOPY (EGD);  Surgeon: Rogene Houston, MD;  [facility];  Service: Endoscopy;  Laterality: N/A;  . PARTIAL COLECTOMY N/A 04/08/2020   Procedure: PARTIAL COLECTOMY;  Surgeon: Virl Cagey, MD;  Location: [facility];  Service: General;  Laterality: N/A;  . PORTACATH PLACEMENT Left 05/18/2020   Procedure: INSERTION PORT-A-CATH (ATTACHED CATHETER IN LEFT SUBCLAVIAN);  Surgeon: Virl Cagey, MD;  Location: [facility];  Service: General;  Laterality: Left;  . TONSILLECTOMY      Social History   Socioeconomic History  . Marital status: Divorced    Spouse name: Not on file  . Number of children: Not on file  . Years of education: Not on file  . Highest education level: Not on file  Occupational History  . Not on file  Tobacco Use  . Smoking status: Never Smoker  . Smokeless tobacco: Never Used  Vaping Use  . Vaping Use: Never used  Substance and Sexual Activity  . Alcohol use: No  . Drug use: No  . Sexual activity: Not  Currently  Other Topics Concern  . Not on file  Social History Narrative  . Not on file   Social Determinants of Health   Financial Resource Strain: Medium Risk  . Difficulty of Paying Living Expenses: Somewhat hard  Food Insecurity: Food Insecurity Present  . Worried About Charity fundraiser in the Last Year: Sometimes true  . Ran Out of Food in the Last Year: Never true  Transportation Needs: Unmet Transportation Needs  . Lack of Transportation (Medical): Yes  . Lack of Transportation (Non-Medical): Yes  Physical Activity: Insufficiently Active  . Days of Exercise per Week: 7 days  . Minutes of Exercise per Session: 20 min  Stress: Stress Concern Present  . Feeling of Stress : Very much  Social Connections: Moderately Integrated  . Frequency  of Communication with Friends and Family: More than three times a week  . Frequency of Social Gatherings with Friends and Family: Once a week  . Attends Religious Services: More than 4 times per year  . Active Member of Clubs or Organizations: Yes  . Attends Archivist Meetings: Never  . Marital Status: Divorced     FAMILY HISTORY:  We obtained a detailed, 4-generation family history.  Significant diagnoses are listed below: Family History  Problem Relation Age of Onset  . Lung cancer Father        dx late 98s; smoking hx  . Leukemia Paternal Uncle        d. 56s  . Cancer Maternal Aunt        ovarian or endometrial dx 22s  . Pancreatic cancer Paternal Uncle        d. late 27s        Ms. Pennywell has one daughter, age 49, and one son, age 45.  Neither have had a colonoscopy yet.  Ms. Leamy has two sisters and one brother, none of whom have a history of cancer.  Ms. Schissler mother is 60.  Ms. Dettmann maternal aunt had endometrial or ovarian cancer diagnosed in her 52 and passed away in her 71s.  No other maternal family history of cancer was reported.  Ms. Yglesias father passed away at age 87 from lung cancer after being diagnosed in his late 5s.  Ms. Barbato had one paternal uncle who passed away from leukemia in his late 34s.  Ms. Rotter also had an uncle who passed away from pancreatic cancer in his late 80s.  No other paternal family history of cancer as reported.   Ms. Mabin is unaware of previous family history of genetic testing for hereditary cancer risks. Patient's maternal ancestors are of White/Caucasian descent, and paternal ancestors are of White/Caucasian descent. There is no reported Ashkenazi Jewish ancestry. There is no known consanguinity.  GENETIC COUNSELING ASSESSMENT: Ms. Tallman is a 74 y.o. female with a family history of colon cancer and family history of gynecological and pancreatic cancer which is somewhat suggestive of a hereditary cancer syndrome  and predisposition to cancer given the MSI/IHC of Ms. Raine's tumor and related cancers in the family. We, therefore, discussed and the following at today's visit.   DISCUSSION: We discussed that 5 - 10% of cancer is hereditary, with most cases of hereditary colon cancer associated with mutations in genes associated with Lynch syndrome, including MLH1, MSH2, MSH6, PMS2, and EPCAM.  There are other genes that can be associated with hereditary colon cancer syndromes.  Type of cancer risk and level of risk are gene-specific.  We discussed that testing  is beneficial for several reasons including knowing how to follow individuals after completing their treatment, identifying whether potential treatment options would be beneficial, and understanding if other family members could be at risk for cancer and allowing them to undergo genetic testing.   We reviewed the characteristics, features and inheritance patterns of hereditary cancer syndromes. We also discussed the benefits and limitations of genetic testing, including the appropriate family members to test, and the implications of a negative, positive, carrier and/or variant of uncertain significant result.  Ms. Beretta was informed of the results of her genetic testing today.   GENETIC TEST RESULTS: Genetic testing reported out on August 14, 2020.  The [facility] Common Hereditary Cancers Panel found no pathogenic mutations. The Common Hereditary Cancers Panel offered by [facility] includes sequencing and/or deletion duplication testing of the following 48 genes: APC, ATM, AXIN2, BARD1, BMPR1A, BRCA1, BRCA2, BRIP1, CDH1, CDK4, CDKN2A (p14ARF), CDKN2A (p16INK4a), CHEK2, CTNNA1, DICER1, EPCAM (Deletion/duplication testing only), GREM1 (promoter region deletion/duplication testing only), KIT, MEN1, MLH1, MSH2, MSH3, MSH6, MUTYH, NBN, NF1, NHTL1, PALB2, PDGFRA, PMS2, POLD1, POLE, PTEN, RAD50, RAD51C, RAD51D, RNF43, SDHB, SDHC, SDHD, SMAD4, SMARCA4. STK11, TP53, TSC1,  TSC2, and VHL.  The following genes were evaluated for sequence changes only: SDHA and HOXB13 c.251G>A variant only. The test report has been scanned into EPIC and is located under the Molecular Pathology section of the Results Review tab.  A portion of the result report is included below for reference.      Updated report: Re-requisition of Ms. Schurman's genetic testing was completed due to somatic mutation in [facility].  Updated germline genetic testing, which included FLCN, was negative.  The updated report date is January 21, 2021.     We discussed with Ms. Chiem that because current genetic testing is not perfect, it is possible there may be a gene mutation in one of these genes that current testing cannot detect, but that chance is small.  We also discussed, that there could be another gene that has not yet been discovered, or that we have not yet tested, that is responsible for the cancer diagnoses in the family. It is also possible there is a hereditary cause for the cancer in the family that Ms. Basil did not inherit and therefore was not identified in her testing.  Therefore, it is important to remain in touch with cancer genetics in the future so that we can continue to offer Ms. Pinkstaff the most up to date genetic testing.   Genetic testing did identify a variant of uncertain significance (VUS) was identified in the HOXB13 gene called c.634G>A (p.Ala212Thr).  At this time, it is unknown if this variant is associated with increased cancer risk or if this is a normal finding, but most variants such as this get reclassified to being inconsequential. It should not be used to make medical management decisions. With time, we suspect the lab will determine the significance of this variant, if any. If we do learn more about it, we will try to contact Ms. Escorcia to discuss it further. However, it is important to stay in touch with [place] periodically and keep the address and phone number up to  date.   ADDITIONAL GENETIC TESTING: We discussed with Ms. Diop that there are other genes that are associated with increased cancer risk that can be analyzed. Should Ms. Morrissette wish to pursue additional genetic testing, we are happy to discuss and coordinate this testing, at any time.     CANCER SCREENING RECOMMENDATIONS: Ms.  Hugh's test result is considered negative (normal).  This means that we have not identified a hereditary cause for her personal history of colon cancer at this time. Most cancers happen by chance and this negative test suggests that her cancer may fall into this category.    While reassuring, this does not definitively rule out a hereditary predisposition to cancer. It is still possible that there could be genetic mutations that are undetectable by current technology. There could be genetic mutations in genes that have not been tested or identified to increase cancer risk.  Therefore, it is recommended she continue to follow the cancer management and screening guidelines provided by her oncology and primary healthcare provider.   An individual's cancer risk and medical management are not determined by genetic test results alone. Overall cancer risk assessment incorporates additional factors, including personal medical history, family history, and any available genetic information that may result in a personalized plan for cancer prevention and surveillance  RECOMMENDATIONS FOR FAMILY MEMBERS:  Individuals in this family might be at some increased risk of developing cancer, over the general population risk, simply due to the family history of cancer.  We recommended women in this family have a yearly mammogram beginning at age 74, or 5 years younger than the earliest onset of cancer, an annual clinical breast exam, and perform monthly breast self-exams. Women in this family should also have a gynecological exam as recommended by their primary provider. All first degree relatives  should have a colonoscopy beginning at age 65 and repeat every 5 years. All family members should be referred for colonoscopy starting at age 15.  It is also possible there is a hereditary cause for the cancer in Ms. Weatherbee's family that she did not inherit and therefore was not identified in her.  Based on Ms. Levitan's family history, we recommended her paternal cousins (children of the paternal uncle who passed away from pancreatic cancer) have genetic counseling and testing. Ms. Cardosa will let us know if we can be of any assistance in coordinating genetic counseling and/or testing for this family member.   FOLLOW-UP: Lastly, we discussed with Ms. Kemmerer that cancer genetics is a rapidly advancing field and it is possible that new genetic tests will be appropriate for her and/or her family members in the future. We encouraged her to remain in contact with cancer genetics on an annual basis so we can update her personal and family histories and let her know of advances in cancer genetics that may benefit this family.   Our contact number was provided. Ms. Shellhammer questions were answered to her satisfaction, and she knows she is welcome to call us at anytime with additional questions or concerns.    Larin Weissberg M. Joette Catching, [place], [facility] Certified Film/video [email or web address] (P) [phone]  The patient was seen for a total of 25 minutes in televideo genetic counseling.  This patient was discussed with Drs. Magrinat, Lindi Adie and/or Burr Medico who agrees with the above.   _______________________________________________________________________ For Office Staff:  Number of people involved in session: 1 Was an Intern/ student involved with case: no

## 2020-08-21 NOTE — Telephone Encounter (Signed)
New antibiotic sent

## 2020-08-21 NOTE — Telephone Encounter (Signed)
Pt's daughter made aware of results. She knows to wash hands frequently. Wear gloves and clean with bleach after pt uses the restroom.  She will call back in one week after pt completes ATB if pt continues to have loose, watery stool.

## 2020-08-26 ENCOUNTER — Ambulatory Visit (INDEPENDENT_AMBULATORY_CARE_PROVIDER_SITE_OTHER): Payer: Medicare HMO | Admitting: Family Medicine

## 2020-08-26 ENCOUNTER — Other Ambulatory Visit: Payer: Self-pay

## 2020-08-26 ENCOUNTER — Encounter: Payer: Self-pay | Admitting: Family Medicine

## 2020-08-26 VITALS — BP 138/61 | HR 65 | Temp 98.1°F | Ht 62.0 in | Wt 124.0 lb

## 2020-08-26 DIAGNOSIS — E876 Hypokalemia: Secondary | ICD-10-CM

## 2020-08-26 DIAGNOSIS — Z23 Encounter for immunization: Secondary | ICD-10-CM | POA: Diagnosis not present

## 2020-08-26 DIAGNOSIS — A498 Other bacterial infections of unspecified site: Secondary | ICD-10-CM

## 2020-08-26 NOTE — Patient Instructions (Signed)
Treatment is not indicated in patients who have a positive diagnostic laboratory assay but do not have diarrhea or other CDI disease manifestations, as asymptomatic carriage is common.  She should be able to proceed with her procedure

## 2020-08-26 NOTE — Progress Notes (Signed)
Subjective: CC: Follow-up C. difficile PCP: Janora Norlander, DO Gabrielle [name] is a 74 y.o. female presenting to clinic today for:  1.  C. difficile Patient is accompanied by her daughter today's appointment.  She notes total resolution of diarrhea but still has several days of the vancomycin left over.  She has been through now 4 rounds of vancomycin for persistently positive C. difficile and ongoing symptoms.  Her watchman procedure has been delayed because of the ongoing C. difficile infection.  She reports good p.o. intake.  Again, denies any ongoing diarrhea for several days.  No abdominal pain or fevers.   ROS: Per HPI  Allergies  Allergen Reactions  . Lisinopril Cough   Past Medical History:  Diagnosis Date  . Arthritis   . Cancer ([place])   . Cataract    OU  . Colon cancer ([facility])   . Family history of pancreatic cancer 08/21/2020  . Family history of uterine cancer 08/21/2020  . H/O cesarean section   . Hx of tonsillectomy   . Hyperlipidemia   . Hypertension   . Macular degeneration    Exu ARMD OU  . Paroxysmal atrial fibrillation ([place]) 07/05/2020    Current Outpatient Medications:  .  alendronate (FOSAMAX) 70 MG tablet, Take 1 tablet (70 mg total) by mouth every 7 (seven) days. Take with a full glass of water on an empty stomach., Disp: 4 tablet, Rfl: 11 .  amLODipine (NORVASC) 5 MG tablet, Take 1 tablet (5 mg total) by mouth daily., Disp: 90 tablet, Rfl: 3 .  atorvastatin (LIPITOR) 20 MG tablet, Take 1 tablet (20 mg total) by mouth daily., Disp: 90 tablet, Rfl: 3 .  cholecalciferol (VITAMIN D3) 25 MCG (1000 UT) tablet, Take 1,000 Units by mouth daily., Disp: , Rfl:  .  CRANBERRY FRUIT PO, Take 1 tablet by mouth daily., Disp: , Rfl:  .  diclofenac Sodium (VOLTAREN) 1 % GEL, Apply 2 g topically 4 (four) times daily. , Disp: , Rfl:  .  diphenoxylate-atropine (LOMOTIL) 2.5-0.025 MG tablet, Take 1 tablet by mouth 4 (four) times daily as needed for diarrhea or loose  stools. , Disp: , Rfl:  .  dorzolamide-timolol (COSOPT) 22.3-6.8 MG/ML ophthalmic solution, PLACE 1 DROP INTO BOTH EYES 2 TIMES A DAY, Disp: 10 mL, Rfl: 0 .  famotidine (PEPCID) 20 MG tablet, Take 1 tablet (20 mg total) by mouth 2 (two) times daily as needed for heartburn or indigestion., Disp: 60 tablet, Rfl: 2 .  ferrous sulfate 325 (65 FE) MG EC tablet, Take 1 tablet (325 mg total) by mouth 2 (two) times daily with a meal., Disp: 180 tablet, Rfl: 3 .  fluconazole (DIFLUCAN) 150 MG tablet, Take by mouth. , Disp: , Rfl:  .  LEUCOVORIN CALCIUM IV, Inject into the vein every 14 (fourteen) days. , Disp: , Rfl:  .  lidocaine-prilocaine (EMLA) cream, Apply a small amount to port a cath site and cover with plastic wrap 1 hour prior to chemotherapy appointments, Disp: 30 g, Rfl: 0 .  losartan-hydrochlorothiazide (HYZAAR) 100-25 MG tablet, Take 1 tablet by mouth daily., Disp: , Rfl:  .  OXALIPLATIN IV, Inject into the vein every 14 (fourteen) days. , Disp: , Rfl:  .  potassium chloride SA (KLOR-CON) 20 MEQ tablet, Take 1 tablet (20 mEq total) by mouth daily., Disp: 90 tablet, Rfl: 1 .  prochlorperazine (COMPAZINE) 10 MG tablet, Take 1 tablet (10 mg total) by mouth every 6 (six) hours as needed for nausea or  vomiting., Disp: 30 tablet, Rfl: 0 .  saccharomyces boulardii (FLORASTOR) 250 MG capsule, Take by mouth., Disp: , Rfl:  .  vancomycin (VANCOCIN HCL) 125 MG capsule, Take 1 capsule (125 mg total) by mouth 4 (four) times daily for 10 days., Disp: 40 capsule, Rfl: 0 Social History   Socioeconomic History  . Marital status: Divorced    Spouse name: Not on file  . Number of children: Not on file  . Years of education: Not on file  . Highest education level: Not on file  Occupational History  . Not on file  Tobacco Use  . Smoking status: Never Smoker  . Smokeless tobacco: Never Used  Vaping Use  . Vaping Use: Never used  Substance and Sexual Activity  . Alcohol use: No  . Drug use: No  . Sexual  activity: Not Currently  Other Topics Concern  . Not on file  Social History Narrative  . Not on file   Social Determinants of Health   Financial Resource Strain: Medium Risk  . Difficulty of Paying Living Expenses: Somewhat hard  Food Insecurity: Food Insecurity Present  . Worried About Charity fundraiser in the Last Year: Sometimes true  . Ran Out of Food in the Last Year: Never true  Transportation Needs: Unmet Transportation Needs  . Lack of Transportation (Medical): Yes  . Lack of Transportation (Non-Medical): Yes  Physical Activity: Insufficiently Active  . Days of Exercise per Week: 7 days  . Minutes of Exercise per Session: 20 min  Stress: Stress Concern Present  . Feeling of Stress : Very much  Social Connections: Moderately Integrated  . Frequency of Communication with Friends and Family: More than three times a week  . Frequency of Social Gatherings with Friends and Family: Once a week  . Attends Religious Services: More than 4 times per year  . Active Member of Clubs or Organizations: Yes  . Attends Archivist Meetings: Never  . Marital Status: Divorced  Human resources officer Violence: Not At Risk  . Fear of Current or Ex-Partner: No  . Emotionally Abused: No  . Physically Abused: No  . Sexually Abused: No   Family History  Problem Relation Age of Onset  . Macular degeneration Mother   . Stroke Mother   . Hypertension Mother   . Dementia Mother   . Lung cancer Father        dx late 64s; smoking hx  . Diabetes Sister   . Hypertension Sister   . Leukemia Paternal Uncle        d. 16s  . Cancer Maternal Aunt        ovarian or endometrial dx 30s  . Pancreatic cancer Paternal Uncle        d. late 45s    Objective: Office vital signs reviewed. BP 138/61   Pulse 65   Temp 98.1 F (36.7 C)   Ht 5\' 2"  (1.575 m)   Wt 124 lb (56.2 kg)   SpO2 98%   BMI 22.68 kg/m   Physical Examination:  General: Awake, alert, nontoxic, No acute distress GI:  nondistended, nontender  Assessment/ Plan: 74 y.o. female   1. Hypokalemia Check BMP given hypokalemia though I suspect that this was related to diarrhea at that time. - Basic Metabolic Panel  2. Clostridioides difficile infection Does not need repeat testing given absence of symptoms.  Once she has completed antibiotics, okay to proceed with watchman.  We discussed that her stool may show persistent positive  C. difficile despite lack of ongoing infection.  She has been totally asymptomatic for several days now.  3. Need for immunization against influenza Administered during today's visit - Flu Vaccine QUAD High Dose(Fluad)   No orders of the defined types were placed in this encounter.  No orders of the defined types were placed in this encounter.    Janora Norlander, DO [facility] [phone]

## 2020-08-27 DIAGNOSIS — I69898 Other sequelae of other cerebrovascular disease: Secondary | ICD-10-CM | POA: Diagnosis not present

## 2020-08-27 DIAGNOSIS — Z85038 Personal history of other malignant neoplasm of large intestine: Secondary | ICD-10-CM | POA: Diagnosis not present

## 2020-08-27 DIAGNOSIS — M48061 Spinal stenosis, lumbar region without neurogenic claudication: Secondary | ICD-10-CM | POA: Diagnosis not present

## 2020-08-27 DIAGNOSIS — M6281 Muscle weakness (generalized): Secondary | ICD-10-CM | POA: Diagnosis not present

## 2020-08-31 NOTE — Patient Instructions (Signed)
Visit Information  Goals Addressed              This Visit's Progress     Patient Stated   .  Anemia Management (pt-stated)        Current Barriers:  . Chronic Disease Management support and education needs related to anemia in a patient with CKD  Hypertension, hyperlipidemia, macular degeneration  Nurse Case Manager Clinical Goal(s):  Marland Kitchen Over the next 30 days, patient will meet with RN Care Manager to address anemia . Over the next 30 days, patient will see GI regarding anemia . Over the next 30 days, patient will see nephrology regarding anemia   Interventions:  . Chart reviewed, including relevant office notes and lab reports . Patient had cataract removal on 10/10/20 . Previously discussed GI and nephrology referrals and collaborated with referral coordinator . Previously encouraged to take iron as instructed . Provided with RN contact number and encouraged to reach out as needed  Patient Self Care Activities:  . Performs ADL's independently . Performs IADL's independently . Unable to independently N/A  Please see past updates related to this goal by clicking on the "Past Updates" button in the selected goal         Patient verbalizes understanding of instructions provided today.   Follow-up Plan The care management team will reach out to the patient again over the next 60 days.   Chong Sicilian, BSN, RN-BC Embedded Chronic Care Manager [facility] Management Direct Dial: [phone]

## 2020-08-31 NOTE — Chronic Care Management (AMB) (Signed)
Chronic Care Management   Follow Up Note   10/16/2019 Name: Gabrielle [name] MRN: [id number] DOB: [birth year]  Referred by: Janora Norlander, DO Reason for referral : Chronic Care Management (RN Follow up and Care Coordination)   Gabrielle [name] is a 74 y.o. year old female who is a primary care patient of Janora Norlander, DO. The CCM team was consulted for assistance with chronic disease management and care coordination needs.    Review of patient status, including review of consultants reports, relevant laboratory and other test results, and collaboration with appropriate care team members and the patient's provider was performed as part of comprehensive patient evaluation and provision of chronic care management services.    SDOH (Social Determinants of Health) assessments performed: No See Care Plan activities for detailed interventions related to [facility])     Outpatient Encounter Medications as of 10/16/2019  Medication Sig  . alendronate (FOSAMAX) 70 MG tablet Take 1 tablet (70 mg total) by mouth every 7 (seven) days. Take with a full glass of water on an empty stomach.  . cholecalciferol (VITAMIN D3) 25 MCG (1000 UT) tablet Take 1,000 Units by mouth daily.  . [EXPIRED] dorzolamide-timolol (COSOPT) 22.3-6.8 MG/ML ophthalmic solution Place 1 drop into both eyes 2 (two) times daily.  . ferrous sulfate 325 (65 FE) MG EC tablet Take 1 tablet (325 mg total) by mouth 2 (two) times daily with a meal.  . [DISCONTINUED] atorvastatin (LIPITOR) 20 MG tablet Take 1 tablet (20 mg total) by mouth daily.  . [DISCONTINUED] diclofenac sodium (VOLTAREN) 1 % GEL Apply 4 g topically 4 (four) times daily. (Patient taking differently: Apply 4 g topically 2 (two) times daily. )  . [DISCONTINUED] losartan-hydrochlorothiazide (HYZAAR) 100-25 MG tablet Take one (1) by mouth daily.  . [EXPIRED] cyclopentolate-phenylephrine (CYCLOMYDRYL) 0.2-1 % ophthalmic solution 1 drop   . [EXPIRED] phenylephrine  (MYDFRIN) 2.5 % ophthalmic solution 1 drop   . [EXPIRED] tetracaine (PONTOCAINE) 0.5 % ophthalmic solution 1 drop   . [DISCONTINUED] 0.3 mL EPINEPHrine 1 mg/mL(1:1000) in BSS (500 mL) irrigation   . [DISCONTINUED] balanced salts (BSS) ophthalmic solution   . [DISCONTINUED] lidocaine hcl (ophth) (AKTEN) 3.5 % ophthalmic gel 1 application   . [DISCONTINUED] neomycin-polymyxin b-dexamethasone (MAXITROL) ophthalmic suspension   . [DISCONTINUED] povidone-iodine (BETADINE) 5 % ophthalmic solution   . [DISCONTINUED] shugarcaine ophthalmic solution   . [DISCONTINUED] sodium hyaluronate (HEALON 5 DS) intraocular injection   . [DISCONTINUED] sodium hyaluronate (PROVISC) intraocular injection    No facility-administered encounter medications on file as of 10/16/2019.     RN Care Plan: Goals Addressed              This Visit's Progress     Patient Stated   .  Anemia Management (pt-stated)        Current Barriers:  . Chronic Disease Management support and education needs related to anemia in a patient with CKD  Hypertension, hyperlipidemia, macular degeneration  Nurse Case Manager Clinical Goal(s):  Marland Kitchen Over the next 30 days, patient will meet with RN Care Manager to address anemia . Over the next 30 days, patient will see GI regarding anemia . Over the next 30 days, patient will see nephrology regarding anemia   Interventions:  . Chart reviewed, including relevant office notes and lab reports . Patient had cataract removal on 10/10/20 . Previously discussed GI and nephrology referrals and collaborated with referral coordinator . Previously encouraged to take iron as instructed . Provided with RN contact number and  encouraged to reach out as needed  Patient Self Care Activities:  . Performs ADL's independently . Performs IADL's independently . Unable to independently N/A  Please see past updates related to this goal by clicking on the "Past Updates" button in the selected goal            Plan:   The care management team will reach out to the patient again over the next 60 days.   Chong Sicilian, BSN, RN-BC Embedded Chronic Care Manager [facility] [facility] Direct Dial: [phone]

## 2020-09-10 ENCOUNTER — Other Ambulatory Visit: Payer: Self-pay

## 2020-09-10 ENCOUNTER — Encounter (INDEPENDENT_AMBULATORY_CARE_PROVIDER_SITE_OTHER): Payer: Self-pay | Admitting: Gastroenterology

## 2020-09-10 ENCOUNTER — Ambulatory Visit (INDEPENDENT_AMBULATORY_CARE_PROVIDER_SITE_OTHER): Payer: Medicare HMO | Admitting: Gastroenterology

## 2020-09-10 VITALS — BP 145/79 | HR 75 | Temp 98.4°F | Ht 62.0 in | Wt 127.0 lb

## 2020-09-10 DIAGNOSIS — Z8619 Personal history of other infectious and parasitic diseases: Secondary | ICD-10-CM | POA: Diagnosis not present

## 2020-09-10 DIAGNOSIS — C189 Malignant neoplasm of colon, unspecified: Secondary | ICD-10-CM

## 2020-09-10 NOTE — Patient Instructions (Signed)
Advance diet as tolerated Follow up with Dr. Worthy Keeler regarding colon cancer

## 2020-09-10 NOTE — Progress Notes (Signed)
Gabrielle [name], M.D. Gastroenterology & Hepatology [facility] [address] [place], [place] [address]  Primary Care Physician: Janora Norlander, [place] [address]  I will communicate my assessment and recommendations to the referring MD via EMR. "Note: Occasional unusual wording and randomly placed punctuation marks may result from the use of speech recognition technology to transcribe this document"  Problems: 1. H/o recurrent C. Diff infection 2. Stage IIIb poorly differentiated R colon adenocarcinoma, FOLFOX and 5-FU not tolerated  History of Present Illness: Gabrielle [name] is a 74 y.o. female with past medical history of stage IIIb colonic adenocarcinoma, recurrent episodes of C. difficile, hyperlipidemia, hypertension, macular degeneration and paroxysmal atrial fibrillation, who presents for follow up of recent episode of C. difficile infection.  The patient had her first episode of C. difficile after receiving her first dose of chemotherapy back in June 2021.  4 days she required an oral course of vancomycin which she finished adequately with resolution of her symptoms.  It is unclear how many episodes of C. difficile she has presented, although by medical record there is evidence of at least 3 episodes of C. difficile colitis which has been treated with courses of vancomycin of 2 weeks.    Her most recent episode was on 08/21/2020.  She has already finished her antibiotic course.  The patient states that after finishing the medication, she has not presented any symptoms.  States that she does have 2 bowel movements with normal consistency per day without any blood or mucus.  States that very occasionally she has mild discomfort in the left side of her abdomen but no presence of nausea, vomiting, fever, chills, abdominal distention, melena or weight changes.  She has been restricting her diet to a low dairy diet  and avoiding food that may lead to diarrhea.  Regarding her colon Cancer, the patient underwent resection of her malignancy but had positive lymph nodes and had poor differentiation.  Due to this, she underwent 2 cycles of different chemotherapeutic agents the, however she was not able to complete the courses as she had side effects with the medications.  Due to this, the plan is to monitor with close follow-up of her CEA levels and discuss possibility of using other agents if recurrent malignancy occurs.  Past Medical History: Past Medical History:  Diagnosis Date  . Arthritis   . Cancer ([place])   . Cataract    OU  . Colon cancer ([place])   . Family history of pancreatic cancer 08/21/2020  . Family history of uterine cancer 08/21/2020  . H/O cesarean section   . Hx of tonsillectomy   . Hyperlipidemia   . Hypertension   . Macular degeneration    Exu ARMD OU  . Paroxysmal atrial fibrillation ([place]) 07/05/2020    Past Surgical History: Past Surgical History:  Procedure Laterality Date  . BIOPSY  04/06/2020   Procedure: BIOPSY;  Surgeon: Rogene Houston, MD;  Location: [facility];  Service: Endoscopy;;  . CARPAL TUNNEL RELEASE Right   . CATARACT EXTRACTION W/PHACO Left 10/11/2019   Procedure: CATARACT EXTRACTION PHACO AND INTRAOCULAR LENS PLACEMENT (IOC);  Surgeon: Baruch Goldmann, MD;  Location: [facility] ORS;  Service: Ophthalmology;  Laterality: Left;  CDE: 8.56  . CATARACT EXTRACTION W/PHACO Right 10/25/2019   Procedure: CATARACT EXTRACTION PHACO AND INTRAOCULAR LENS PLACEMENT (IOC);  Surgeon: Baruch Goldmann, MD;  Location: [facility] ORS;  Service: Ophthalmology;  Laterality: Right;  CDE: 5.67  .  CESAREAN SECTION    . COLONOSCOPY N/A 04/06/2020   Procedure: COLONOSCOPY;  Surgeon: Rogene Houston, MD;  Location: [facility];  Service: Endoscopy;  Laterality: N/A;  . CYSTOSCOPY WITH BIOPSY N/A 04/08/2020   Procedure: CYSTOSCOPY WITH BIOPSY;  Surgeon: Cleon Gustin, MD;  Location: [facility] ORS;  Service:  Urology;  Laterality: N/A;  . ESOPHAGOGASTRODUODENOSCOPY N/A 04/05/2020   Procedure: ESOPHAGOGASTRODUODENOSCOPY (EGD);  Surgeon: Rogene Houston, MD;  [facility]: [facility];  Service: Endoscopy;  Laterality: N/A;  . PARTIAL COLECTOMY N/A 04/08/2020   Procedure: PARTIAL COLECTOMY;  Surgeon: Virl Cagey, MD;  Location: [facility] ORS;  Service: General;  Laterality: N/A;  . PORTACATH PLACEMENT Left 05/18/2020   Procedure: INSERTION PORT-A-CATH (ATTACHED CATHETER IN LEFT SUBCLAVIAN);  Surgeon: Virl Cagey, MD;  Location: [facility] ORS;  Service: General;  Laterality: Left;  . TONSILLECTOMY      Family History: Family History  Problem Relation Age of Onset  . Macular degeneration Mother   . Stroke Mother   . Hypertension Mother   . Dementia Mother   . Lung cancer Father        dx late 56s; smoking hx  . Diabetes Sister   . Hypertension Sister   . Leukemia Paternal Uncle        d. 76s  . Cancer Maternal Aunt        ovarian or endometrial dx 39s  . Pancreatic cancer Paternal Uncle        d. late 37s    Social History: Social History   Tobacco Use  Smoking Status Never Smoker  Smokeless Tobacco Never Used   Social History   Substance and Sexual Activity  Alcohol Use No   Social History   Substance and Sexual Activity  Drug Use No    Allergies: Allergies  Allergen Reactions  . Lisinopril Cough    Medications: Current Outpatient Medications  Medication Sig Dispense Refill  . alendronate (FOSAMAX) 70 MG tablet Take 1 tablet (70 mg total) by mouth every 7 (seven) days. Take with a full glass of water on an empty stomach. 4 tablet 11  . amLODipine (NORVASC) 5 MG tablet Take 1 tablet (5 mg total) by mouth daily. 90 tablet 3  . atorvastatin (LIPITOR) 20 MG tablet Take 1 tablet (20 mg total) by mouth daily. 90 tablet 3  . cholecalciferol (VITAMIN D3) 25 MCG (1000 UT) tablet Take 1,000 Units by mouth daily.    Marland Kitchen CRANBERRY FRUIT PO Take 1 tablet by mouth daily.    .  diclofenac Sodium (VOLTAREN) 1 % GEL Apply 2 g topically 4 (four) times daily.     . dorzolamide-timolol (COSOPT) 22.3-6.8 MG/ML ophthalmic solution PLACE 1 DROP INTO BOTH EYES 2 TIMES A DAY 10 mL 0  . ferrous sulfate 325 (65 FE) MG EC tablet Take 1 tablet (325 mg total) by mouth 2 (two) times daily with a meal. 180 tablet 3  . Garlic 242 MG TABS Take 100 mg by mouth daily.    Marland Kitchen lidocaine-prilocaine (EMLA) cream Apply a small amount to port a cath site and cover with plastic wrap 1 hour prior to chemotherapy appointments 30 g 0  . losartan-hydrochlorothiazide (HYZAAR) 100-25 MG tablet Take 1 tablet by mouth daily.    . diphenoxylate-atropine (LOMOTIL) 2.5-0.025 MG tablet Take 1 tablet by mouth 4 (four) times daily as needed for diarrhea or loose stools.  (Patient not taking: Reported on 09/10/2020)    . famotidine (PEPCID) 20 MG tablet Take  1 tablet (20 mg total) by mouth 2 (two) times daily as needed for heartburn or indigestion. (Patient not taking: Reported on 09/10/2020) 60 tablet 2  . LEUCOVORIN CALCIUM IV Inject into the vein every 14 (fourteen) days.  (Patient not taking: Reported on 09/10/2020)    . OXALIPLATIN IV Inject into the vein every 14 (fourteen) days.  (Patient not taking: Reported on 09/10/2020)    . potassium chloride SA (KLOR-CON) 20 MEQ tablet Take 1 tablet (20 mEq total) by mouth daily. (Patient not taking: Reported on 09/10/2020) 90 tablet 1  . prochlorperazine (COMPAZINE) 10 MG tablet Take 1 tablet (10 mg total) by mouth every 6 (six) hours as needed for nausea or vomiting. (Patient not taking: Reported on 09/10/2020) 30 tablet 0  . saccharomyces boulardii (FLORASTOR) 250 MG capsule Take by mouth. (Patient not taking: Reported on 09/10/2020)     No current facility-administered medications for this visit.    Review of Systems: GENERAL: negative for malaise, night sweats HEENT: No changes in hearing or vision, no nose bleeds or other nasal problems. NECK: Negative for lumps,  goiter, pain and significant neck swelling RESPIRATORY: Negative for cough, wheezing CARDIOVASCULAR: Negative for chest pain, leg swelling, palpitations, orthopnea GI: SEE HPI MUSCULOSKELETAL: Negative for joint pain or swelling, back pain, and muscle pain. SKIN: Negative for lesions, rash PSYCH: Negative for sleep disturbance, mood disorder and recent psychosocial stressors. HEMATOLOGY Negative for prolonged bleeding, bruising easily, and swollen nodes. ENDOCRINE: Negative for cold or heat intolerance, polyuria, polydipsia and goiter. NEURO: negative for tremor, gait imbalance, syncope and seizures. The remainder of the review of systems is noncontributory.   Physical Exam: BP (!) 145/79 (BP Location: Right Arm, Patient Position: Sitting, Cuff Size: Small)   Pulse 75   Temp 98.4 F (36.9 C) (Oral)   Ht 5\' 2"  (1.575 m)   Wt 127 lb (57.6 kg)   BMI 23.23 kg/m  GENERAL: The patient is AO x3, in no acute distress. [place]. HEENT: Head is normocephalic and atraumatic. EOMI are intact. Mouth is well hydrated and without lesions. NECK: Supple. No masses LUNGS: Clear to auscultation. No presence of rhonchi/wheezing/rales. Adequate chest expansion HEART: RRR, normal s1 and s2. ABDOMEN: Soft, nontender, no guarding, no peritoneal signs, and nondistended. BS +. No masses. Has abdominal scar. EXTREMITIES: Without any cyanosis, clubbing, rash, lesions or edema. NEUROLOGIC: AOx3, no focal motor deficit. SKIN: no jaundice, no rashes  Imaging/Labs: as above  I personally reviewed and interpreted the available labs, imaging and endoscopic files.  Impression and Plan: Gabrielle [name] is a 74 y.o. female with past medical history of stage IIIb colonic adenocarcinoma, recurrent episodes of C. difficile, hyperlipidemia, hypertension, macular degeneration and paroxysmal atrial fibrillation, who presents for follow up of recent episode of C. difficile infection.  The patient has presented at least 3  episodes of C. difficile infection which have responded to antibiotic treatment with vancomycin.  He has recently finished his course with resolution of symptoms, for which she is considered to be cured.  There is no need to further perform any stool testing as she may have false positive stool tests that do not warrant any more treatment.  I advised her that she can advance her diet as tolerated as there is no contraindication for this.  In terms of her colorectal cancer, will reach Dr. Jamey Reas to assess when he wants to repeat her colonoscopy for surveillance after resection.  - Advance diet as tolerated - Follow up with Dr. Worthy Keeler regarding colon  cancer treatment  All questions were answered.      Harvel Quale, MD Gastroenterology and Hepatology [facility]

## 2020-09-20 DIAGNOSIS — M48061 Spinal stenosis, lumbar region without neurogenic claudication: Secondary | ICD-10-CM | POA: Diagnosis not present

## 2020-09-20 DIAGNOSIS — M6281 Muscle weakness (generalized): Secondary | ICD-10-CM | POA: Diagnosis not present

## 2020-09-20 DIAGNOSIS — Z85038 Personal history of other malignant neoplasm of large intestine: Secondary | ICD-10-CM | POA: Diagnosis not present

## 2020-09-20 DIAGNOSIS — I69898 Other sequelae of other cerebrovascular disease: Secondary | ICD-10-CM | POA: Diagnosis not present

## 2020-10-01 ENCOUNTER — Other Ambulatory Visit: Payer: Self-pay | Admitting: Family Medicine

## 2020-10-14 NOTE — Progress Notes (Signed)
[facility] Note  10/15/2020     CHIEF COMPLAINT Patient presents for Retina Follow Up   HISTORY OF PRESENT ILLNESS: Gabrielle [name] is a 74 y.o. female who presents to the clinic today for:   HPI    Retina Follow Up    Patient presents with  Wet AMD.  In both eyes.  This started 2 months ago.  I, the attending physician,  performed the HPI with the patient and updated documentation appropriately.          Comments    Patient here for 2 months retina follow up for exu ARMD OU. Patient states vision about the same. No eye pain.        Last edited by Bernarda Caffey, MD on 10/15/2020 10:38 PM. (History)    pt states her left eye vision has gotten worse   Referring physician: Janora Norlander, DO [place],  [place] [address]  HISTORICAL INFORMATION:   Selected notes from the MEDICAL RECORD NUMBER Referred by Dr. Cristela Blue for concern of ARMD OU;  LEE- 01.03.19 Cristela Blue) [BCVA OD: 10/50 OS: 20/80] Ocular Hx- ARMD OU, DME PMH- elevated chol., HTN    CURRENT MEDICATIONS: Current Outpatient Medications (Ophthalmic Drugs)  Medication Sig  . dorzolamide-timolol (COSOPT) 22.3-6.8 MG/ML ophthalmic solution PLACE 1 DROP INTO BOTH EYES 2 TIMES A DAY   No current facility-administered medications for this visit. (Ophthalmic Drugs)   Current Outpatient Medications (Other)  Medication Sig  . alendronate (FOSAMAX) 70 MG tablet Take 1 tablet (70 mg total) by mouth every 7 (seven) days. Take with a full glass of water on an empty stomach.  Marland Kitchen amLODipine (NORVASC) 5 MG tablet Take 1 tablet (5 mg total) by mouth daily.  Marland Kitchen atorvastatin (LIPITOR) 20 MG tablet Take 1 tablet (20 mg total) by mouth daily.  . cholecalciferol (VITAMIN D3) 25 MCG (1000 UT) tablet Take 1,000 Units by mouth daily.  Marland Kitchen CRANBERRY FRUIT PO Take 1 tablet by mouth daily.  . diclofenac Sodium (VOLTAREN) 1 % GEL Apply 2 g topically 4 (four) times daily.   . diphenoxylate-atropine  (LOMOTIL) 2.5-0.025 MG tablet Take 1 tablet by mouth 4 (four) times daily as needed for diarrhea or loose stools.  (Patient not taking: Reported on 09/10/2020)  . famotidine (PEPCID) 20 MG tablet Take 1 tablet (20 mg total) by mouth 2 (two) times daily as needed for heartburn or indigestion. (Patient not taking: Reported on 09/10/2020)  . FEROSUL 325 (65 Fe) MG tablet TAKE  (1)  TABLET TWICE A DAY WITH MEALS (BREAKFAST AND SUPPER)  . Garlic 818 MG TABS Take 100 mg by mouth daily.  Marland Kitchen LEUCOVORIN CALCIUM IV Inject into the vein every 14 (fourteen) days.  (Patient not taking: Reported on 09/10/2020)  . lidocaine-prilocaine (EMLA) cream Apply a small amount to port a cath site and cover with plastic wrap 1 hour prior to chemotherapy appointments  . losartan-hydrochlorothiazide (HYZAAR) 100-25 MG tablet Take 1 tablet by mouth daily.  . OXALIPLATIN IV Inject into the vein every 14 (fourteen) days.  (Patient not taking: Reported on 09/10/2020)  . potassium chloride SA (KLOR-CON) 20 MEQ tablet Take 1 tablet (20 mEq total) by mouth daily. (Patient not taking: Reported on 09/10/2020)  . prochlorperazine (COMPAZINE) 10 MG tablet Take 1 tablet (10 mg total) by mouth every 6 (six) hours as needed for nausea or vomiting. (Patient not taking: Reported on 09/10/2020)  . saccharomyces boulardii (FLORASTOR) 250 MG  capsule Take by mouth. (Patient not taking: Reported on 09/10/2020)   No current facility-administered medications for this visit. (Other)      REVIEW OF SYSTEMS: ROS    Positive for: Gastrointestinal, Musculoskeletal, Eyes   Negative for: Constitutional, Neurological, Skin, Genitourinary, HENT, Endocrine, Cardiovascular, Respiratory, Psychiatric, Allergic/Imm, Heme/Lymph   Last edited by Theodore Demark, COA on 10/15/2020  9:03 AM. (History)       ALLERGIES Allergies  Allergen Reactions  . Lisinopril Cough    PAST MEDICAL HISTORY Past Medical History:  Diagnosis Date  . Arthritis   . Cancer  ([place])   . Cataract    OU  . Colon cancer ([place])   . Family history of pancreatic cancer 08/21/2020  . Family history of uterine cancer 08/21/2020  . H/O cesarean section   . Hx of tonsillectomy   . Hyperlipidemia   . Hypertension   . Macular degeneration    Exu ARMD OU  . Paroxysmal atrial fibrillation ([place]) 07/05/2020   Past Surgical History:  Procedure Laterality Date  . BIOPSY  04/06/2020   Procedure: BIOPSY;  Surgeon: Rogene Houston, MD;  Location: [facility];  Service: Endoscopy;;  . CARPAL TUNNEL RELEASE Right   . CATARACT EXTRACTION W/PHACO Left 10/11/2019   Procedure: CATARACT EXTRACTION PHACO AND INTRAOCULAR LENS PLACEMENT (IOC);  Surgeon: Baruch Goldmann, MD;  Location: [facility] ORS;  Service: Ophthalmology;  Laterality: Left;  CDE: 8.56  . CATARACT EXTRACTION W/PHACO Right 10/25/2019   Procedure: CATARACT EXTRACTION PHACO AND INTRAOCULAR LENS PLACEMENT (IOC);  Surgeon: Baruch Goldmann, MD;  Location: [facility] OR[facility];  Service: Ophthalmology;  Laterality: Right;  CDE: 5.67  . CESAREAN SECTION    . COLONOSCOPY N/A 04/06/2020   Procedure: COLONOSCOPY;  Surgeon: Rogene Houston, MD;  Location: [facility];  Service: Endoscopy;  Laterality: N/A;  . CYSTOSCOPY WITH BIOPSY N/A 04/08/2020   Procedure: CYSTOSCOPY WITH BIOPSY;  Surgeon: Cleon Gustin, MD;  Location: [facility];  Service: Urology;  Laterality: N/A;  . ESOPHAGOGASTRODUODENOSCOPY N/A 04/05/2020   Procedure: ESOPHAGOGASTRODUODENOSCOPY (EGD);  Surgeon: Rogene Houston, MD;  [facility];  Service: Endoscopy;  Laterality: N/A;  . PARTIAL COLECTOMY N/A 04/08/2020   Procedure: PARTIAL COLECTOMY;  Surgeon: Virl Cagey, MD;  Location: [facility];  Service: General;  Laterality: N/A;  . PORTACATH PLACEMENT Left 05/18/2020   Procedure: INSERTION PORT-A-CATH (ATTACHED CATHETER IN LEFT SUBCLAVIAN);  Surgeon: Virl Cagey, MD;  Location: [facility];  Service: General;  Laterality: Left;  . TONSILLECTOMY      FAMILY HISTORY Family  History  Problem Relation Age of Onset  . Macular degeneration Mother   . Stroke Mother   . Hypertension Mother   . Dementia Mother   . Lung cancer Father        dx late 41s; smoking hx  . Diabetes Sister   . Hypertension Sister   . Leukemia Paternal Uncle        d. 93s  . Cancer Maternal Aunt        ovarian or endometrial dx 14s  . Pancreatic cancer Paternal Uncle        d. late 63s    SOCIAL HISTORY Social History   Tobacco Use  . Smoking status: Never Smoker  . Smokeless tobacco: Never Used  Vaping Use  . Vaping Use: Never used  Substance Use Topics  . Alcohol use: No  . Drug use: No         OPHTHALMIC EXAM:  Base  Eye Exam    Visual Acuity (Snellen - Linear)      Right Left   Dist cc CF at 3' 20/100 -2   Dist ph cc NI NI   Correction: Glasses       Tonometry (Tonopen, 9:01 AM)      Right Left   Pressure 17 15       Pupils      Dark Light Shape React APD   Right 4 3 Round Brisk None   Left 4 3 Round Brisk None       Visual Fields (Counting fingers)      Left Right    Full Full       Extraocular Movement      Right Left    Full Full       Neuro/Psych    Oriented x3: Yes   Mood/Affect: Normal       Dilation    Both eyes: 1.0% Mydriacyl, 2.5% Phenylephrine @ 9:00 AM        Slit Lamp and Fundus Exam    Slit Lamp Exam      Right Left   Lids/Lashes Dermatochalasis - upper lid Dermatochalasis - upper lid   Conjunctiva/Sclera White and quiet White and quiet   Cornea Arcus, 1-2+ Punctate epithelial erosions Arcus, 1-2+ Punctate epithelial erosions   Anterior Chamber Deep and quiet Deep and quiet   Iris Round and well dilated Round and well dilated   Lens PCIOL in good position PCIOL in good position   Vitreous Vitreous syneresis, Posterior vitreous detachment Vitreous syneresis, Posterior vitreous detachment       Fundus Exam      Right Left   Disc pink and sharp, compact pink and sharp, Compact, vascular loops superiorly   C/D Ratio  0.2 0.2   Macula Blunted foveal reflex, Drusen, Pigment clumping and atrophy, central thickening/disciform scar, +PED, no heme, persistent IRF/cystic changes overlying central scar Blunted foveal reflex, central thickening, RPE clumping and atrophy, Drusen, RPE rip, mild IRF/SRF overlying PED -- persistent, new focal hemorrhage temporal side of central thickening/fibrosis   Vessels Vascular attenuation, Tortuous Vascular attenuation, Tortuous   Periphery Attached, mild Reticular degeneration Attached, mild Reticular degeneration          IMAGING AND PROCEDURES  Imaging and Procedures for 03/21/18  OCT, Retina - OU - Both Eyes       Right Eye Quality was good. Central Foveal Thickness: 502. Progression has improved. Findings include pigment epithelial detachment, epiretinal membrane, subretinal hyper-reflective material, retinal drusen , abnormal foveal contour, no SRF, intraretinal fluid, disciform scar, outer retinal tubulation, outer retinal atrophy (Stable sub-retinal scar, mild interval improvement in  IRF overlying SRHM).   Left Eye Quality was good. Central Foveal Thickness: 343. Progression has worsened. Findings include subretinal hyper-reflective material, pigment epithelial detachment, intraretinal fluid, retinal drusen , abnormal foveal contour, outer retinal atrophy, subretinal fluid (persistent IRF overlying SRHM/PED, shallow SRF overlying PED).   Notes *Images captured and stored on drive  Diagnosis / Impression:  Exudative ARMD OU OD: Stable sub-retinal scar, persistent IRF overlying SRHM OS: persistent IRF overlying SRHM/PED, shallow SRF overlying PED  Clinical management:  See below  Abbreviations: NFP - Normal foveal profile. CME - cystoid macular edema. PED - pigment epithelial detachment. IRF - intraretinal fluid. SRF - subretinal fluid. EZ - ellipsoid zone. ERM - epiretinal membrane. ORA - outer retinal atrophy. ORT - outer retinal tubulation. SRHM - subretinal  hyper-reflective material  Intravitreal Injection, Pharmacologic Agent - OS - Left Eye       Time Out 10/15/2020. 10:10 AM. Confirmed correct patient, procedure, site, and patient consented.   Anesthesia Topical anesthesia was used. Anesthetic medications included Lidocaine 2%, Proparacaine 0.5%.   Procedure Preparation included 5% betadine to ocular surface, eyelid speculum. A (33g) needle was used.   Injection:  2 mg aflibercept Alfonse Flavors) SOLN   NDC: [id number], Lot: [id number], Expiration date: 01/05/2021   Route: Intravitreal, Site: Left Eye, Waste: 0.05 mL  Post-op Post injection exam found visual acuity of at least counting fingers. The patient tolerated the procedure well. There were no complications. The patient received written and verbal post procedure care education.                 ASSESSMENT/PLAN:    ICD-10-CM   1. Exudative age-related macular degeneration of both eyes with active choroidal neovascularization (HCC)  H35.3231 Intravitreal Injection, Pharmacologic Agent - OS - Left Eye    aflibercept (EYLEA) SOLN 2 mg  2. Retinal edema  H35.81 OCT, Retina - OU - Both Eyes  3. Posterior vitreous detachment of both eyes  H43.813   4. Pseudophakia of both eyes  Z96.1   5. Ocular hypertension, bilateral  H40.053     1,2. Exudative age related macular degeneration, both eyes.    - severe exudative disease with very active CNVM OU at presentation in January 2019  - S/P IVA OD #1 (01.04.19), #2 (02.15.19)  - S/P IVA OS #1 (01.18.19), #2 (02.15.19)  - switched to Eylea 3.18.19 due to severity of disease  - S/P IVE OD #1 (03.18.19), #2 (04.17.19), #3 (05.15.19) -- injections held due to stable disciform scar, #4 (10.02.19),  #5 (01.15.20), #6 (06.04.20), #7 (10.29.20), #8 (01.21.21), #9 (05.27.21), #10 (07.22.21)  - S/P IVE OS #1 (03.18.19), #2 (04.17.19), #3 (05.15.19), #4 (06.12.19), #5 (07.10.19), #6 (08.07.19), #7 (09.04.19), #8 (10.02.19), #9 (11.06.19),  #10 (12.11.19), #11 (01.15.20), #12 (02.20.20), #13 (03.26.20), #14 (04.30.20), #15 (06.04.20), #16 (07.16.20), # 17 (08.20.20), #18 (09.24.20), #19 (10.29.20), #20 (12.03.20), #21 (01.21.21), #22 (03.18.21), #23 (05.27.21), #24 (07.22.21)  - IVE held since 7.22.21 due to TIA/stroke on 8.1.21  - OCT OS today shows persistent IRF overlying PED and interval development of SRF  - exam OS shows new heme  - OD with significant subretinal disciform scar - persistent IRF overlying IRHM  - BCVA OD stable at CF 3'; OS 20/100 - decreased from 20/80  - recommend IVE OS #25 today, 11.11.21 -- will hold OD  - Eylea informed consent form signed and scanned on 01.21.2021  - Eylea4U paperwork filled out on 02.15.19 and fully approved through [employer] -- approved  - f/u in 4 weeks, sooner prn for DFE/OCT/possible injection(s)  3. PVD / vitreous syneresis  - Discussed findings and prognosis  - No RT or RD on 360  exam  - Reviewed s/s of RT/RD  - Strict return precautions for any such RT/RD signs/symptoms  4. Pseudophakia OU  - s/p CE/IOL (Dr. Marisa Hua, 11.2020)  - beautiful surgeries w/ IOLs in excellent position, doing well  - monitor  5. Ocular Hypertension OU  - IOP okay today -- 15 OD, 14 OS  - cont Cosopt qd OU   Ophthalmic Meds Ordered this visit:  Meds ordered this encounter  Medications  . aflibercept (EYLEA) SOLN 2 mg       Return in about 4 weeks (around 11/12/2020) for f/u exu ARMD OU, DFE, OCT.  There are  no Patient Instructions on file for this visit.  This document serves as a record of services personally performed by Gardiner Sleeper, MD, PhD. It was created on their behalf by Leonie Douglas, an ophthalmic technician. The creation of this record is the provider's dictation and/or activities during the visit.    Electronically signed by: Leonie Douglas COA, 10/15/20  10:45 PM  Gardiner Sleeper, M.D., Ph.D. Diseases & Surgery of the Retina and Vitreous [facility]  I have reviewed the above documentation for accuracy and completeness, and I agree with the above. Gardiner Sleeper, M.D., Ph.D. 10/15/20 10:45 PM    Abbreviations: M myopia (nearsighted); A astigmatism; H hyperopia (farsighted); P presbyopia; Mrx spectacle prescription;  CTL contact lenses; OD right eye; OS left eye; OU both eyes  XT exotropia; ET esotropia; PEK punctate epithelial keratitis; PEE punctate epithelial erosions; DES dry eye syndrome; MGD meibomian gland dysfunction; ATs artificial tears; PFAT's preservative free artificial tears; [place] nuclear sclerotic cataract; PSC posterior subcapsular cataract; ERM epi-retinal membrane; PVD posterior vitreous detachment; RD retinal detachment; DM diabetes mellitus; DR diabetic retinopathy; NPDR non-proliferative diabetic retinopathy; PDR proliferative diabetic retinopathy; CSME clinically significant macular edema; DME diabetic macular edema; dbh dot blot hemorrhages; CWS cotton wool spot; POAG primary open angle glaucoma; C/D cup-to-disc ratio; HVF humphrey visual field; GVF goldmann visual field; OCT optical coherence tomography; IOP intraocular pressure; BRVO Branch retinal vein occlusion; CRVO central retinal vein occlusion; CRAO central retinal artery occlusion; BRAO branch retinal artery occlusion; RT retinal tear; SB scleral buckle; PPV pars plana vitrectomy; VH Vitreous hemorrhage; PRP panretinal laser photocoagulation; IVK intravitreal kenalog; VMT vitreomacular traction; MH Macular hole;  NVD neovascularization of the disc; NVE neovascularization elsewhere; AREDS age related eye disease study; ARMD age related macular degeneration; POAG primary open angle glaucoma; EBMD epithelial/anterior basement membrane dystrophy; ACIOL anterior chamber intraocular lens; IOL intraocular lens; PCIOL posterior chamber intraocular lens; Phaco/IOL phacoemulsification with intraocular lens placement; [place] photorefractive keratectomy; LASIK laser assisted in  situ keratomileusis; HTN hypertension; DM diabetes mellitus; COPD chronic obstructive pulmonary disease

## 2020-10-15 ENCOUNTER — Encounter (INDEPENDENT_AMBULATORY_CARE_PROVIDER_SITE_OTHER): Payer: Self-pay | Admitting: Ophthalmology

## 2020-10-15 ENCOUNTER — Other Ambulatory Visit: Payer: Self-pay

## 2020-10-15 ENCOUNTER — Ambulatory Visit (INDEPENDENT_AMBULATORY_CARE_PROVIDER_SITE_OTHER): Payer: Medicare HMO | Admitting: Ophthalmology

## 2020-10-15 DIAGNOSIS — Z961 Presence of intraocular lens: Secondary | ICD-10-CM

## 2020-10-15 DIAGNOSIS — H43813 Vitreous degeneration, bilateral: Secondary | ICD-10-CM | POA: Diagnosis not present

## 2020-10-15 DIAGNOSIS — H40053 Ocular hypertension, bilateral: Secondary | ICD-10-CM

## 2020-10-15 DIAGNOSIS — H3581 Retinal edema: Secondary | ICD-10-CM

## 2020-10-15 DIAGNOSIS — H353231 Exudative age-related macular degeneration, bilateral, with active choroidal neovascularization: Secondary | ICD-10-CM

## 2020-10-15 MED ORDER — AFLIBERCEPT 2MG/0.05ML IZ SOLN FOR KALEIDOSCOPE
2.0000 mg | INTRAVITREAL | Status: AC | PRN
  Administered 2020-10-15: 2 mg via INTRAVITREAL

## 2020-10-21 DIAGNOSIS — I69898 Other sequelae of other cerebrovascular disease: Secondary | ICD-10-CM | POA: Diagnosis not present

## 2020-10-21 DIAGNOSIS — Z85038 Personal history of other malignant neoplasm of large intestine: Secondary | ICD-10-CM | POA: Diagnosis not present

## 2020-10-21 DIAGNOSIS — M48061 Spinal stenosis, lumbar region without neurogenic claudication: Secondary | ICD-10-CM | POA: Diagnosis not present

## 2020-10-21 DIAGNOSIS — M6281 Muscle weakness (generalized): Secondary | ICD-10-CM | POA: Diagnosis not present

## 2020-10-27 ENCOUNTER — Ambulatory Visit (HOSPITAL_COMMUNITY)
Admission: RE | Admit: 2020-10-27 | Discharge: 2020-10-27 | Disposition: A | Discharge status: Home / Self Care | Payer: Medicare HMO | Source: Ambulatory Visit | Attending: Hematology | Admitting: Hematology

## 2020-10-27 ENCOUNTER — Encounter (HOSPITAL_COMMUNITY): Payer: Self-pay

## 2020-10-27 ENCOUNTER — Inpatient Hospital Stay (HOSPITAL_COMMUNITY): Payer: Medicare HMO | Attending: Hematology

## 2020-10-27 ENCOUNTER — Other Ambulatory Visit: Payer: Self-pay

## 2020-10-27 DIAGNOSIS — C182 Malignant neoplasm of ascending colon: Secondary | ICD-10-CM

## 2020-10-27 MED ORDER — IOHEXOL 300 MG/ML  SOLN: 75.0000 mL | Freq: Once | INTRAMUSCULAR | Status: DC | PRN

## 2020-11-02 ENCOUNTER — Ambulatory Visit (HOSPITAL_COMMUNITY)
Admission: RE | Admit: 2020-11-02 | Discharge: 2020-11-02 | Disposition: A | Discharge status: Home / Self Care | Payer: Medicare HMO | Source: Ambulatory Visit | Attending: Hematology | Admitting: Hematology

## 2020-11-02 ENCOUNTER — Other Ambulatory Visit: Payer: Self-pay

## 2020-11-02 DIAGNOSIS — C182 Malignant neoplasm of ascending colon: Secondary | ICD-10-CM | POA: Insufficient documentation

## 2020-11-02 DIAGNOSIS — K769 Liver disease, unspecified: Secondary | ICD-10-CM | POA: Diagnosis not present

## 2020-11-02 DIAGNOSIS — G459 Transient cerebral ischemic attack, unspecified: Secondary | ICD-10-CM | POA: Diagnosis not present

## 2020-11-02 DIAGNOSIS — I517 Cardiomegaly: Secondary | ICD-10-CM | POA: Diagnosis not present

## 2020-11-02 DIAGNOSIS — R918 Other nonspecific abnormal finding of lung field: Secondary | ICD-10-CM | POA: Diagnosis not present

## 2020-11-02 DIAGNOSIS — I700 Atherosclerosis of aorta: Secondary | ICD-10-CM | POA: Diagnosis not present

## 2020-11-02 DIAGNOSIS — I480 Paroxysmal atrial fibrillation: Secondary | ICD-10-CM | POA: Diagnosis not present

## 2020-11-02 DIAGNOSIS — N281 Cyst of kidney, acquired: Secondary | ICD-10-CM | POA: Diagnosis not present

## 2020-11-02 DIAGNOSIS — N3289 Other specified disorders of bladder: Secondary | ICD-10-CM | POA: Diagnosis not present

## 2020-11-02 DIAGNOSIS — C189 Malignant neoplasm of colon, unspecified: Secondary | ICD-10-CM | POA: Diagnosis not present

## 2020-11-02 DIAGNOSIS — I2510 Atherosclerotic heart disease of native coronary artery without angina pectoris: Secondary | ICD-10-CM | POA: Diagnosis not present

## 2020-11-02 LAB — POCT I-STAT CREATININE: Creatinine, Ser: 1.2 mg/dL — ABNORMAL HIGH (ref 0.44–1.00)

## 2020-11-02 MED ORDER — IOHEXOL 300 MG/ML  SOLN
75.0000 mL | Freq: Once | INTRAMUSCULAR | Status: AC | PRN
  Administered 2020-11-02: 75 mL via INTRAVENOUS

## 2020-11-03 ENCOUNTER — Ambulatory Visit (HOSPITAL_COMMUNITY): Payer: Medicare HMO | Admitting: Hematology

## 2020-11-04 DIAGNOSIS — I4891 Unspecified atrial fibrillation: Secondary | ICD-10-CM | POA: Insufficient documentation

## 2020-11-05 ENCOUNTER — Inpatient Hospital Stay (HOSPITAL_COMMUNITY): Payer: Medicare HMO | Attending: Hematology

## 2020-11-05 ENCOUNTER — Inpatient Hospital Stay (HOSPITAL_BASED_OUTPATIENT_CLINIC_OR_DEPARTMENT_OTHER): Payer: Medicare HMO | Admitting: Hematology

## 2020-11-05 ENCOUNTER — Other Ambulatory Visit: Payer: Self-pay

## 2020-11-05 VITALS — BP 149/62 | HR 60 | Resp 16 | Wt 135.7 lb

## 2020-11-05 DIAGNOSIS — C182 Malignant neoplasm of ascending colon: Secondary | ICD-10-CM | POA: Diagnosis not present

## 2020-11-05 DIAGNOSIS — Z7189 Other specified counseling: Secondary | ICD-10-CM | POA: Diagnosis not present

## 2020-11-05 DIAGNOSIS — Z79899 Other long term (current) drug therapy: Secondary | ICD-10-CM | POA: Diagnosis not present

## 2020-11-05 DIAGNOSIS — Z5112 Encounter for antineoplastic immunotherapy: Secondary | ICD-10-CM | POA: Insufficient documentation

## 2020-11-05 DIAGNOSIS — C189 Malignant neoplasm of colon, unspecified: Secondary | ICD-10-CM

## 2020-11-05 LAB — CBC WITH DIFFERENTIAL/PLATELET
Abs Immature Granulocytes: 0.02 10*3/uL (ref 0.00–0.07)
Basophils Absolute: 0 10*3/uL (ref 0.0–0.1)
Basophils Relative: 0 %
Eosinophils Absolute: 0.2 10*3/uL (ref 0.0–0.5)
Eosinophils Relative: 3 %
HCT: 37.8 % (ref 36.0–46.0)
Hemoglobin: 12.1 g/dL (ref 12.0–15.0)
Immature Granulocytes: 0 %
Lymphocytes Relative: 21 %
Lymphs Abs: 1.2 10*3/uL (ref 0.7–4.0)
MCH: 30.9 pg (ref 26.0–34.0)
MCHC: 32 g/dL (ref 30.0–36.0)
MCV: 96.4 fL (ref 80.0–100.0)
Monocytes Absolute: 0.5 10*3/uL (ref 0.1–1.0)
Monocytes Relative: 9 %
Neutro Abs: 3.8 10*3/uL (ref 1.7–7.7)
Neutrophils Relative %: 67 %
Platelets: 155 10*3/uL (ref 150–400)
RBC: 3.92 MIL/uL (ref 3.87–5.11)
RDW: 12.2 % (ref 11.5–15.5)
WBC: 5.8 10*3/uL (ref 4.0–10.5)
nRBC: 0 % (ref 0.0–0.2)

## 2020-11-05 LAB — COMPREHENSIVE METABOLIC PANEL
ALT: 8 U/L (ref 0–44)
AST: 21 U/L (ref 15–41)
Albumin: 4.3 g/dL (ref 3.5–5.0)
Alkaline Phosphatase: 64 U/L (ref 38–126)
Anion gap: 8 (ref 5–15)
BUN: 31 mg/dL — ABNORMAL HIGH (ref 8–23)
CO2: 30 mmol/L (ref 22–32)
Calcium: 9.9 mg/dL (ref 8.9–10.3)
Chloride: 98 mmol/L (ref 98–111)
Creatinine, Ser: 1.05 mg/dL — ABNORMAL HIGH (ref 0.44–1.00)
GFR, Estimated: 56 mL/min — ABNORMAL LOW (ref >60)
Glucose, Bld: 88 mg/dL (ref 70–99)
Potassium: 4.1 mmol/L (ref 3.5–5.1)
Sodium: 136 mmol/L (ref 135–145)
Total Bilirubin: 0.6 mg/dL (ref 0.3–1.2)
Total Protein: 8 g/dL (ref 6.5–8.1)

## 2020-11-05 LAB — MAGNESIUM: Magnesium: 2.1 mg/dL (ref 1.7–2.4)

## 2020-11-05 NOTE — Patient Instructions (Signed)
[place] at [facility] Discharge Instructions  You were seen today by Dr. Delton Coombes. He went over your recent results and scans: your cancer is showing signs of returning and will require starting immunotherapy with Keytruda, given once every 3 weeks. Dr. Delton Coombes will see you back in 4 weeks for labs and follow up.   Thank you for choosing [place] at [facility] to provide your oncology and hematology care.  To afford each patient quality time with our provider, please arrive at least 15 minutes before your scheduled appointment time.   If you have a lab appointment with the [facility] please come in thru the Main Entrance and check in at the main information desk  You need to re-schedule your appointment should you arrive 10 or more minutes late.  We strive to give you quality time with our providers, and arriving late affects you and other patients whose appointments are after yours.  Also, if you no show three or more times for appointments you may be dismissed from the clinic at the providers discretion.     Again, thank you for choosing [facility].  Our hope is that these requests will decrease the amount of time that you wait before being seen by our physicians.       _____________________________________________________________  Should you have questions after your visit to [facility], please contact our office at [phone] between the hours of 8:00 a.m. and 4:30 p.m.  Voicemails left after 4:00 p.m. will not be returned until the following business day.  For prescription refill requests, have your pharmacy contact our office and allow 72 hours.    Cancer Center Support Programs:   > Cancer Support Group  2nd Tuesday of the month 1pm-2pm, Journey Room

## 2020-11-05 NOTE — Progress Notes (Signed)
DISCONTINUE ON PATHWAY REGIMEN - Colorectal     A cycle is every 14 days:     Oxaliplatin      Leucovorin      Fluorouracil      Fluorouracil   **Always confirm dose/schedule in your pharmacy ordering system**  REASON: Toxicities / Adverse Event PRIOR TREATMENT: COS67: [id number] q14 Days x 6 Months TREATMENT RESPONSE: Progressive Disease (PD)  START ON PATHWAY REGIMEN - Colorectal     A cycle is every 21 days:     Pembrolizumab   **Always confirm dose/schedule in your pharmacy ordering system**  Patient Characteristics: Distant Metastases, Nonsurgical Candidate, BRAF V600 Mutation Positive (KRAS/NRAS Wild-Type), Immunotherapy, MSI-H/dMMR Tumor Location: Colon Therapeutic Status: Distant Metastases Microsatellite/Mismatch Repair Status: MSI-H/dMMR BRAF Mutation Status: Mutation Positive KRAS/NRAS Mutation Status: Wild-Type (no mutation) Intent of Therapy: Non-Curative / Palliative Intent, Discussed with Patient

## 2020-11-05 NOTE — Progress Notes (Signed)
[facility] Note  11/12/2020     CHIEF COMPLAINT Patient presents for Retina Follow Up   HISTORY OF PRESENT ILLNESS: Gabrielle [name] is a 74 y.o. female who presents to the clinic today for:   HPI    Retina Follow Up    Patient presents with  Wet AMD.  In both eyes.  This started weeks ago.  Severity is moderate.  Duration of weeks.  Since onset it is stable.  I, the attending physician,  performed the HPI with the patient and updated documentation appropriately.          Comments    Pt states vision is about the same OU.  Pt denies eye pain or discomfort and denies any new or worsening floaters or fol OU.       Last edited by Bernarda Caffey, MD on 11/12/2020  9:56 AM. (History)    pt states her left eye vision has gotten worse   Referring physician: Janora Norlander, DO [place],  [place] [address]  HISTORICAL INFORMATION:   Selected notes from the MEDICAL RECORD NUMBER Referred by Dr. Cristela Blue for concern of ARMD OU;  LEE- 01.03.19 Cristela Blue) [BCVA OD: 10/50 OS: 20/80] Ocular Hx- ARMD OU, DME PMH- elevated chol., HTN    CURRENT MEDICATIONS: Current Outpatient Medications (Ophthalmic Drugs)  Medication Sig  . dorzolamide-timolol (COSOPT) 22.3-6.8 MG/ML ophthalmic solution PLACE 1 DROP INTO BOTH EYES 2 TIMES A DAY   No current facility-administered medications for this visit. (Ophthalmic Drugs)   Current Outpatient Medications (Other)  Medication Sig  . alendronate (FOSAMAX) 70 MG tablet Take 1 tablet (70 mg total) by mouth every 7 (seven) days. Take with a full glass of water on an empty stomach.  Marland Kitchen amLODipine (NORVASC) 5 MG tablet Take 1 tablet (5 mg total) by mouth daily.  Marland Kitchen atorvastatin (LIPITOR) 20 MG tablet Take 1 tablet (20 mg total) by mouth daily.  . cholecalciferol (VITAMIN D3) 25 MCG (1000 UT) tablet Take 1,000 Units by mouth daily.  Marland Kitchen CRANBERRY FRUIT PO Take 1 tablet by mouth daily.  . diclofenac Sodium (VOLTAREN)  1 % GEL Apply 2 g topically 4 (four) times daily.   . FEROSUL 325 (65 Fe) MG tablet TAKE  (1)  TABLET TWICE A DAY WITH MEALS (BREAKFAST AND SUPPER)  . Garlic 086 MG TABS Take 100 mg by mouth daily.  Marland Kitchen LEUCOVORIN CALCIUM IV Inject into the vein every 14 (fourteen) days.   Marland Kitchen lidocaine-prilocaine (EMLA) cream Apply a small amount to port a cath site and cover with plastic wrap 1 hour prior to chemotherapy appointments (Patient not taking: Reported on 11/05/2020)  . losartan-hydrochlorothiazide (HYZAAR) 100-25 MG tablet Take 1 tablet by mouth daily.  . OXALIPLATIN IV Inject into the vein every 14 (fourteen) days.   . potassium chloride SA (KLOR-CON) 20 MEQ tablet Take 1 tablet (20 mEq total) by mouth daily.  . prochlorperazine (COMPAZINE) 10 MG tablet Take 1 tablet (10 mg total) by mouth every 6 (six) hours as needed for nausea or vomiting. (Patient not taking: Reported on 11/05/2020)  . saccharomyces boulardii (FLORASTOR) 250 MG capsule Take by mouth.    No current facility-administered medications for this visit. (Other)      REVIEW OF SYSTEMS: ROS    Positive for: Gastrointestinal, Musculoskeletal, Eyes   Negative for: Constitutional, Neurological, Skin, Genitourinary, HENT, Endocrine, Cardiovascular, Respiratory, Psychiatric, Allergic/Imm, Heme/Lymph   Last edited by Doneen Poisson on  11/12/2020  9:12 AM. (History)       ALLERGIES Allergies  Allergen Reactions  . Lisinopril Cough    PAST MEDICAL HISTORY Past Medical History:  Diagnosis Date  . Arthritis   . Cancer ([place])   . Cataract    OU  . Colon cancer ([place])   . Family history of pancreatic cancer 08/21/2020  . Family history of uterine cancer 08/21/2020  . H/O cesarean section   . Hx of tonsillectomy   . Hyperlipidemia   . Hypertension   . Macular degeneration    Exu ARMD OU  . Paroxysmal atrial fibrillation ([place]) 07/05/2020   Past Surgical History:  Procedure Laterality Date  . BIOPSY  04/06/2020   Procedure:  BIOPSY;  Surgeon: Rogene Houston, MD;  Location: [facility];  Service: Endoscopy;;  . CARPAL TUNNEL RELEASE Right   . CATARACT EXTRACTION W/PHACO Left 10/11/2019   Procedure: CATARACT EXTRACTION PHACO AND INTRAOCULAR LENS PLACEMENT (IOC);  Surgeon: Baruch Goldmann, MD;  Location: [facility];  Service: Ophthalmology;  Laterality: Left;  CDE: 8.56  . CATARACT EXTRACTION W/PHACO Right 10/25/2019   Procedure: CATARACT EXTRACTION PHACO AND INTRAOCULAR LENS PLACEMENT (IOC);  Surgeon: Baruch Goldmann, MD;  Location: [facility];  Service: Ophthalmology;  Laterality: Right;  CDE: 5.67  . CESAREAN SECTION    . COLONOSCOPY N/A 04/06/2020   Procedure: COLONOSCOPY;  Surgeon: Rogene Houston, MD;  Location: [facility];  Service: Endoscopy;  Laterality: N/A;  . CYSTOSCOPY WITH BIOPSY N/A 04/08/2020   Procedure: CYSTOSCOPY WITH BIOPSY;  Surgeon: Cleon Gustin, MD;  Location: [facility];  Service: Urology;  Laterality: N/A;  . ESOPHAGOGASTRODUODENOSCOPY N/A 04/05/2020   Procedure: ESOPHAGOGASTRODUODENOSCOPY (EGD);  Surgeon: Rogene Houston, MD;  [facility];  Service: Endoscopy;  Laterality: N/A;  . PARTIAL COLECTOMY N/A 04/08/2020   Procedure: PARTIAL COLECTOMY;  Surgeon: Virl Cagey, MD;  Location: [facility];  Service: General;  Laterality: N/A;  . PORTACATH PLACEMENT Left 05/18/2020   Procedure: INSERTION PORT-A-CATH (ATTACHED CATHETER IN LEFT SUBCLAVIAN);  Surgeon: Virl Cagey, MD;  Location: [facility];  Service: General;  Laterality: Left;  . TONSILLECTOMY      FAMILY HISTORY Family History  Problem Relation Age of Onset  . Macular degeneration Mother   . Stroke Mother   . Hypertension Mother   . Dementia Mother   . Lung cancer Father        dx late 82s; smoking hx  . Diabetes Sister   . Hypertension Sister   . Leukemia Paternal Uncle        d. 42s  . Cancer Maternal Aunt        ovarian or endometrial dx 54s  . Pancreatic cancer Paternal Uncle        d. late 18s    SOCIAL  HISTORY Social History   Tobacco Use  . Smoking status: Never Smoker  . Smokeless tobacco: Never Used  Vaping Use  . Vaping Use: Never used  Substance Use Topics  . Alcohol use: No  . Drug use: No         OPHTHALMIC EXAM:  Base Eye Exam    Visual Acuity (Snellen - Linear)      Right Left   Dist cc CF @ 3' 20/150 -2   Dist ph cc NI NI   Correction: Glasses       Tonometry (Tonopen, 9:14 AM)      Right Left   Pressure 20 19  Pupils      Dark Light Shape React APD   Right 5 4 Round Brisk 0   Left 5 4 Round Brisk 0       Visual Fields      Left Right    Full Full       Extraocular Movement      Right Left    Full Full       Neuro/Psych    Oriented x3: Yes   Mood/Affect: Normal       Dilation    Both eyes: 1.0% Mydriacyl, 2.5% Phenylephrine @ 9:14 AM        Slit Lamp and Fundus Exam    Slit Lamp Exam      Right Left   Lids/Lashes Dermatochalasis - upper lid Dermatochalasis - upper lid   Conjunctiva/Sclera White and quiet White and quiet   Cornea Arcus, 1-2+ Punctate epithelial erosions Arcus, 1-2+ Punctate epithelial erosions   Anterior Chamber Deep and quiet Deep and quiet   Iris Round and well dilated Round and well dilated   Lens PCIOL in good position PCIOL in good position   Vitreous Vitreous syneresis, Posterior vitreous detachment, vitreous condensations Vitreous syneresis, Posterior vitreous detachment       Fundus Exam      Right Left   Disc pink and sharp, compact pink and sharp, Compact, vascular loops superiorly   C/D Ratio 0.2 0.2   Macula Blunted foveal reflex, Drusen, Pigment clumping and atrophy, central thickening/disciform scar, +PED, no heme, persistent IRF/cystic changes overlying central scar Blunted foveal reflex, central thickening, RPE clumping and atrophy, Drusen, RPE rip, mild IRF/SRF overlying PED -- slightly improved, focal hemorrhage temporal side of central thickening/fibrosis -- slightly improved   Vessels  Vascular attenuation, Tortuous Vascular attenuation, Tortuous   Periphery Attached, mild Reticular degeneration Attached, mild Reticular degeneration        Refraction    Wearing Rx      Sphere Cylinder Add   Right [place] Sphere +3.50   Left -0.50 Sphere +3.50   Type: PAL          IMAGING AND PROCEDURES  Imaging and Procedures for 03/21/18  OCT, Retina - OU - Both Eyes       Right Eye Quality was good. Central Foveal Thickness: 489. Progression has improved. Findings include pigment epithelial detachment, epiretinal membrane, subretinal hyper-reflective material, retinal drusen , abnormal foveal contour, no SRF, intraretinal fluid, disciform scar, outer retinal tubulation, outer retinal atrophy (Stable sub-retinal scar, mild interval improvement in cystic changes).   Left Eye Quality was good. Central Foveal Thickness: 352. Progression has improved. Findings include subretinal hyper-reflective material, pigment epithelial detachment, intraretinal fluid, retinal drusen , abnormal foveal contour, outer retinal atrophy, subretinal fluid (Interval improvement in central SRHM and overlying IRF/cystic changes, shallow SRF overlying PED).   Notes *Images captured and stored on drive  Diagnosis / Impression:  Exudative ARMD OU OD: Stable sub-retinal scar, Stable sub-retinal scar, mild interval improvement in cystic changes OS: Interval improvement in central SRHM and overlying IRF/cystic changes, shallow SRF overlying PED  Clinical management:  See below  Abbreviations: NFP - Normal foveal profile. CME - cystoid macular edema. PED - pigment epithelial detachment. IRF - intraretinal fluid. SRF - subretinal fluid. EZ - ellipsoid zone. ERM - epiretinal membrane. ORA - outer retinal atrophy. ORT - outer retinal tubulation. SRHM - subretinal hyper-reflective material        Intravitreal Injection, Pharmacologic Agent - OS - Left Eye  Time Out 11/12/2020. 10:09 AM. Confirmed  correct patient, procedure, site, and patient consented.   Anesthesia Topical anesthesia was used. Anesthetic medications included Lidocaine 2%, Proparacaine 0.5%.   Procedure Preparation included 5% betadine to ocular surface, eyelid speculum. A (33g) needle was used.   Injection:  2 mg aflibercept Alfonse Flavors) SOLN   NDC: A3590391, Lot: [id number], Expiration date: 03/04/2021   Route: Intravitreal, Site: Left Eye, Waste: 0.05 mL  Post-op Post injection exam found visual acuity of at least counting fingers. The patient tolerated the procedure well. There were no complications. The patient received written and verbal post procedure care education. Post injection medications were not given.                 ASSESSMENT/PLAN:    ICD-10-CM   1. Exudative age-related macular degeneration of both eyes with active choroidal neovascularization (HCC)  H35.3231 Intravitreal Injection, Pharmacologic Agent - OS - Left Eye    aflibercept (EYLEA) SOLN 2 mg  2. Retinal edema  H35.81 OCT, Retina - OU - Both Eyes  3. Posterior vitreous detachment of both eyes  H43.813   4. Pseudophakia of both eyes  Z96.1   5. Ocular hypertension, bilateral  H40.053     1,2. Exudative age related macular degeneration, both eyes.    - severe exudative disease with very active CNVM OU at presentation in January 2019  - S/P IVA OD #1 (01.04.19), #2 (02.15.19)  - S/P IVA OS #1 (01.18.19), #2 (02.15.19)  - switched to Eylea 3.18.19 due to severity of disease  - S/P IVE OD #1 (03.18.19), #2 (04.17.19), #3 (05.15.19) -- injections held due to stable disciform scar, #4 (10.02.19),  #5 (01.15.20), #6 (06.04.20), #7 (10.29.20), #8 (01.21.21), #9 (05.27.21), #10 (07.22.21)  - S/P IVE OS #1 (03.18.19), #2 (04.17.19), #3 (05.15.19), #4 (06.12.19), #5 (07.10.19), #6 (08.07.19), #7 (09.04.19), #8 (10.02.19), #9 (11.06.19), #10 (12.11.19), #11 (01.15.20), #12 (02.20.20), #13 (03.26.20), #14 (04.30.20), #15 (06.04.20), #16  (07.16.20), # 17 (08.20.20), #18 (09.24.20), #19 (10.29.20), #20 (12.03.20), #21 (01.21.21), #22 (03.18.21), #23 (05.27.21), #24 (07.22.21), #25 (11.11.21)  - IVE held from 7.22.21 to 11.11.21 due to TIA/stroke on 8.1.21  - OCT OS today Interval improvement in central [facility] and overlying IRF/cystic changes, shallow SRF overlying PED  - exam OS shows persistent macular heme  - OD with significant subretinal disciform scar - improved cystic changes overlying IRHM  - BCVA OD stable at CF 3'; OS 20/150 - decreased from 20/100  - recommend IVE OS #26 today, 12.09.21 -- will hold OD  - Eylea informed consent form signed and scanned on 01.21.2021  - Eylea4U paperwork filled out on 02.15.19 and fully approved through [employer] -- approved  - f/u in 4 weeks, sooner prn for DFE/OCT/possible injection(s)  3. PVD / vitreous syneresis  - Discussed findings and prognosis  - No RT or RD on 360  exam  - Reviewed s/s of RT/RD  - Strict return precautions for any such RT/RD signs/symptoms  4. Pseudophakia OU  - s/p CE/IOL (Dr. Marisa Hua, 11.2020)  - beautiful surgeries w/ IOLs in excellent position, doing well  - monitor  5. Ocular Hypertension OU  - IOP okay today -- 20 OD, 19 OS  - cont Cosopt qd OU -- if remains elevated may increase to BID   Ophthalmic Meds Ordered this visit:  Meds ordered this encounter  Medications  . aflibercept (EYLEA) SOLN 2 mg       Return in about 4 weeks (around 12/10/2020) for  f/u exu ARMD OU, DFE, OCT.  There are no Patient Instructions on file for this visit.  This document serves as a record of services personally performed by Gardiner Sleeper, MD, PhD. It was created on their behalf by Leonie Douglas, an ophthalmic technician. The creation of this record is the provider's dictation and/or activities during the visit.    Electronically signed by: Leonie Douglas COA, 11/12/20  9:50 PM   This document serves as a record of services personally performed by Gardiner Sleeper, MD, PhD. It was created on their behalf by San Jetty. Owens Shark, OA an ophthalmic technician. The creation of this record is the provider's dictation and/or activities during the visit.    Electronically signed by: San Jetty. Marguerita Merles 12.09.2021 9:50 PM   Gardiner Sleeper, M.D., Ph.D. Diseases & Surgery of the Retina and Vitreous [facility]  I have reviewed the above documentation for accuracy and completeness, and I agree with the above. Gardiner Sleeper, M.D., Ph.D. 11/12/20 9:50 PM   Abbreviations: M myopia (nearsighted); A astigmatism; H hyperopia (farsighted); P presbyopia; Mrx spectacle prescription;  CTL contact lenses; OD right eye; OS left eye; OU both eyes  XT exotropia; ET esotropia; PEK punctate epithelial keratitis; PEE punctate epithelial erosions; DES dry eye syndrome; MGD meibomian gland dysfunction; ATs artificial tears; PFAT's preservative free artificial tears; [place] nuclear sclerotic cataract; PSC posterior subcapsular cataract; ERM epi-retinal membrane; PVD posterior vitreous detachment; RD retinal detachment; DM diabetes mellitus; DR diabetic retinopathy; NPDR non-proliferative diabetic retinopathy; PDR proliferative diabetic retinopathy; CSME clinically significant macular edema; DME diabetic macular edema; dbh dot blot hemorrhages; CWS cotton wool spot; POAG primary open angle glaucoma; C/D cup-to-disc ratio; HVF humphrey visual field; GVF goldmann visual field; OCT optical coherence tomography; IOP intraocular pressure; BRVO Branch retinal vein occlusion; CRVO central retinal vein occlusion; CRAO central retinal artery occlusion; BRAO branch retinal artery occlusion; RT retinal tear; SB scleral buckle; PPV pars plana vitrectomy; VH Vitreous hemorrhage; PRP panretinal laser photocoagulation; IVK intravitreal kenalog; VMT vitreomacular traction; MH Macular hole;  NVD neovascularization of the disc; NVE neovascularization elsewhere; AREDS age related eye  disease study; ARMD age related macular degeneration; POAG primary open angle glaucoma; EBMD epithelial/anterior basement membrane dystrophy; ACIOL anterior chamber intraocular lens; IOL intraocular lens; PCIOL posterior chamber intraocular lens; Phaco/IOL phacoemulsification with intraocular lens placement; [place] photorefractive keratectomy; LASIK laser assisted in situ keratomileusis; HTN hypertension; DM diabetes mellitus; COPD chronic obstructive pulmonary disease

## 2020-11-05 NOTE — Progress Notes (Signed)
Gabrielle [name], [place] [address]   CLINIC:  Medical Oncology/Hematology  PCP:  Janora Norlander, DO [address] [place] [address] [phone]   REASON FOR VISIT:  Follow-up for right colon cancer  PRIOR THERAPY:  1. Right hemicolectomy on 04/08/2020 2. FOLFOX x 3 cycles from 05/19/2020 to 07/14/2020.  NGS Results: Foundation 1 KRAS/NRAS wild-type, MSI--high, TMB 55 Muts/Mb, BRAF V 600 E  CURRENT THERAPY: Observation  BRIEF ONCOLOGIC HISTORY:  Oncology History  Malignant neoplasm of colon ([place])  04/07/2020 Initial Diagnosis   Malignant neoplasm of ascending colon ([place])   05/13/2020 Genetic Testing   Foundation One     05/19/2020 -  Chemotherapy   The patient had palonosetron (ALOXI) injection 0.25 mg, 0.25 mg, Intravenous,  Once, 2 of 12 cycles Administration: 0.25 mg (05/19/2020), 0.25 mg (06/30/2020) leucovorin 522 mg in dextrose 5 % 250 mL infusion, 320 mg/m2 = 522 mg (80 % of original dose 400 mg/m2), Intravenous,  Once, 2 of 12 cycles Dose modification: 320 mg/m2 (80 % of original dose 400 mg/m2, Cycle 1, Reason: Provider Judgment), 185.6314 mg/m2 (66.7 % of original dose 400 mg/m2, Cycle 2, Reason: Provider Judgment) Administration: 522 mg (05/19/2020), 434 mg (06/30/2020) oxaliplatin (ELOXATIN) 110 mg in dextrose 5 % 500 mL chemo infusion, 68 mg/m2 = 110 mg (80 % of original dose 85 mg/m2), Intravenous,  Once, 1 of 1 cycle Dose modification: 68 mg/m2 (80 % of original dose 85 mg/m2, Cycle 1, Reason: Provider Judgment) Administration: 110 mg (05/19/2020) fluorouracil (ADRUCIL) chemo injection 500 mg, 320 mg/m2 = 500 mg (80 % of original dose 400 mg/m2), Intravenous,  Once, 2 of 12 cycles Dose modification: 320 mg/m2 (80 % of original dose 400 mg/m2, Cycle 1, Reason: Provider Judgment), 970.2637 mg/m2 (66.7 % of original dose 400 mg/m2, Cycle 2, Reason: Provider Judgment) Administration: 500 mg (05/19/2020), 450 mg  (06/30/2020) fluorouracil (ADRUCIL) 3,150 mg in sodium chloride 0.9 % 87 mL chemo infusion, 1,920 mg/m2 = 3,150 mg (80 % of original dose 2,400 mg/m2), Intravenous, 1 Day/Dose, 2 of 12 cycles Dose modification: 1,920 mg/m2 (80 % of original dose 2,400 mg/m2, Cycle 1, Reason: Provider Judgment), 1,600 mg/m2 (66.7 % of original dose 2,400 mg/m2, Cycle 2, Reason: Provider Judgment) Administration: 3,150 mg (05/19/2020), 2,600 mg (06/30/2020)  for chemotherapy treatment.    08/03/2020 Genetic Testing   [facility] Reveal Testing     08/14/2020 Genetic Testing   No pathogenic variants detected in [facility] Common Hereditary Cancers Panel.  Variant of uncertain significance (VUS) detected in HOXB13 at c.634G>A (p.Ala212Thr). The Common Hereditary Cancers Panel offered by [facility] includes sequencing and/or deletion duplication testing of the following 48 genes: APC, ATM, AXIN2, BARD1, BMPR1A, BRCA1, BRCA2, BRIP1, CDH1, CDK4, CDKN2A (p14ARF), CDKN2A (p16INK4a), CHEK2, CTNNA1, DICER1, EPCAM (Deletion/duplication testing only), GREM1 (promoter region deletion/duplication testing only), KIT, MEN1, MLH1, MSH2, MSH3, MSH6, MUTYH, NBN, NF1, NHTL1, PALB2, PDGFRA, PMS2, POLD1, POLE, PTEN, RAD50, RAD51C, RAD51D, RNF43, SDHB, SDHC, SDHD, SMAD4, SMARCA4. STK11, TP53, TSC1, TSC2, and VHL.  The following genes were evaluated for sequence changes only: SDHA and HOXB13 c.251G>A variant only. The report date is August 14, 2020.      CANCER STAGING: Cancer Staging Malignant neoplasm of colon [facility]) Staging form: Colon and Rectum, AJCC 8th Edition - Clinical stage from 04/27/2020: Stage IIIB (cT4a, cN1a, cM0) - Unsigned   INTERVAL HISTORY:  [name], a 74 y.o. female, returns for routine follow-up of her right colon  cancer. Teddy was last contacted via telephone on 08/18/2020.   Today she is accompanied by her neighbor and reports feeling okay. She denies having any abdominal pain, but is having pain in her right  flank and needing Voltaren gel applied QD to QOD; the pain started this week and is not radiating to the front. The pain is worst when she is standing in one spot for a prolonged time, but improves if she sits down or walks. Her appetite is good and she is eating good portions; she denies having N/V/D.  She is now living at home. She will have a Watchman procedure with Dr. Joan Mayans on 12/25/2020.   REVIEW OF SYSTEMS:  Review of Systems  Constitutional: Negative for appetite change and fatigue.  Gastrointestinal: Negative for abdominal pain, diarrhea, nausea and vomiting.  Musculoskeletal: Positive for back pain (R flank pain).  All other systems reviewed and are negative.   PAST MEDICAL/SURGICAL HISTORY:  Past Medical History:  Diagnosis Date  . Arthritis   . Cancer ([place])   . Cataract    OU  . Colon cancer ([place])   . Family history of pancreatic cancer 08/21/2020  . Family history of uterine cancer 08/21/2020  . H/O cesarean section   . Hx of tonsillectomy   . Hyperlipidemia   . Hypertension   . Macular degeneration    Exu ARMD OU  . Paroxysmal atrial fibrillation ([place]) 07/05/2020   Past Surgical History:  Procedure Laterality Date  . BIOPSY  04/06/2020   Procedure: BIOPSY;  Surgeon: Rogene Houston, MD;  [facility];  Service: Endoscopy;;  . CARPAL TUNNEL RELEASE Right   . CATARACT EXTRACTION W/PHACO Left 10/11/2019   Procedure: CATARACT EXTRACTION PHACO AND INTRAOCULAR LENS PLACEMENT (IOC);  Surgeon: Baruch Goldmann, MD;  Location: [facility] ORS;  Service: Ophthalmology;  Laterality: Left;  CDE: 8.56  . CATARACT EXTRACTION W/PHACO Right 10/25/2019   Procedure: CATARACT EXTRACTION PHACO AND INTRAOCULAR LENS PLACEMENT (IOC);  Surgeon: Baruch Goldmann, MD;  Location: [facility] OR[facility];  Service: Ophthalmology;  Laterality: Right;  CDE: 5.67  . CESAREAN SECTION    . COLONOSCOPY N/A 04/06/2020   Procedure: COLONOSCOPY;  Surgeon: Rogene Houston, MD;  Location: [facility];  Service: Endoscopy;   Laterality: N/A;  . CYSTOSCOPY WITH BIOPSY N/A 04/08/2020   Procedure: CYSTOSCOPY WITH BIOPSY;  Surgeon: Cleon Gustin, MD;  Location: [facility] OR[facility];  Service: Urology;  Laterality: N/A;  . ESOPHAGOGASTRODUODENOSCOPY N/A 04/05/2020   Procedure: ESOPHAGOGASTRODUODENOSCOPY (EGD);  Surgeon: Rogene Houston, MD;  [facility];  Service: Endoscopy;  Laterality: N/A;  . PARTIAL COLECTOMY N/A 04/08/2020   Procedure: PARTIAL COLECTOMY;  Surgeon: Virl Cagey, MD;  Location: [facility] OR[facility];  Service: General;  Laterality: N/A;  . PORTACATH PLACEMENT Left 05/18/2020   Procedure: INSERTION PORT-A-CATH (ATTACHED CATHETER IN LEFT SUBCLAVIAN);  Surgeon: Virl Cagey, MD;  Location: [facility];  Service: General;  Laterality: Left;  . TONSILLECTOMY      SOCIAL HISTORY:  Social History   Socioeconomic History  . Marital status: Divorced    Spouse name: Not on file  . Number of children: Not on file  . Years of education: Not on file  . Highest education level: Not on file  Occupational History  . Not on file  Tobacco Use  . Smoking status: Never Smoker  . Smokeless tobacco: Never Used  Vaping Use  . Vaping Use: Never used  Substance and Sexual Activity  . Alcohol use: No  .  Drug use: No  . Sexual activity: Not Currently  Other Topics Concern  . Not on file  Social History Narrative  . Not on file   Social Determinants of Health   Financial Resource Strain: Medium Risk  . Difficulty of Paying Living Expenses: Somewhat hard  Food Insecurity: Food Insecurity Present  . Worried About Charity fundraiser in the Last Year: Sometimes true  . Ran Out of Food in the Last Year: Never true  Transportation Needs: Unmet Transportation Needs  . Lack of Transportation (Medical): Yes  . Lack of Transportation (Non-Medical): Yes  Physical Activity: Insufficiently Active  . Days of Exercise per Week: 7 days  . Minutes of Exercise per Session: 20 min  Stress: Stress Concern Present  . Feeling of  Stress : Very much  Social Connections: Moderately Integrated  . Frequency of Communication with Friends and Family: More than three times a week  . Frequency of Social Gatherings with Friends and Family: Once a week  . Attends Religious Services: More than 4 times per year  . Active Member of Clubs or Organizations: Yes  . Attends Archivist Meetings: Never  . Marital Status: Divorced  Human resources officer Violence: Not At Risk  . Fear of Current or Ex-Partner: No  . Emotionally Abused: No  . Physically Abused: No  . Sexually Abused: No    FAMILY HISTORY:  Family History  Problem Relation Age of Onset  . Macular degeneration Mother   . Stroke Mother   . Hypertension Mother   . Dementia Mother   . Lung cancer Father        dx late 4s; smoking hx  . Diabetes Sister   . Hypertension Sister   . Leukemia Paternal Uncle        d. 60s  . Cancer Maternal Aunt        ovarian or endometrial dx 72s  . Pancreatic cancer Paternal Uncle        d. late 52s    CURRENT MEDICATIONS:  Current Outpatient Medications  Medication Sig Dispense Refill  . alendronate (FOSAMAX) 70 MG tablet Take 1 tablet (70 mg total) by mouth every 7 (seven) days. Take with a full glass of water on an empty stomach. 4 tablet 11  . amLODipine (NORVASC) 5 MG tablet Take 1 tablet (5 mg total) by mouth daily. 90 tablet 3  . atorvastatin (LIPITOR) 20 MG tablet Take 1 tablet (20 mg total) by mouth daily. 90 tablet 3  . cholecalciferol (VITAMIN D3) 25 MCG (1000 UT) tablet Take 1,000 Units by mouth daily.    Marland Kitchen CRANBERRY FRUIT PO Take 1 tablet by mouth daily.    . diclofenac Sodium (VOLTAREN) 1 % GEL Apply 2 g topically 4 (four) times daily.     . dorzolamide-timolol (COSOPT) 22.3-6.8 MG/ML ophthalmic solution PLACE 1 DROP INTO BOTH EYES 2 TIMES A DAY 10 mL 0  . FEROSUL 325 (65 Fe) MG tablet TAKE  (1)  TABLET TWICE A DAY WITH MEALS (BREAKFAST AND SUPPER) 180 tablet 0  . Garlic 045 MG TABS Take 100 mg by mouth  daily.    Marland Kitchen LEUCOVORIN CALCIUM IV Inject into the vein every 14 (fourteen) days.     Marland Kitchen losartan-hydrochlorothiazide (HYZAAR) 100-25 MG tablet Take 1 tablet by mouth daily.    . OXALIPLATIN IV Inject into the vein every 14 (fourteen) days.     . potassium chloride SA (KLOR-CON) 20 MEQ tablet Take 1 tablet (20 mEq total) by  mouth daily. 90 tablet 1  . saccharomyces boulardii (FLORASTOR) 250 MG capsule Take by mouth.     . lidocaine-prilocaine (EMLA) cream Apply a small amount to port a cath site and cover with plastic wrap 1 hour prior to chemotherapy appointments (Patient not taking: Reported on 11/05/2020) 30 g 0  . prochlorperazine (COMPAZINE) 10 MG tablet Take 1 tablet (10 mg total) by mouth every 6 (six) hours as needed for nausea or vomiting. (Patient not taking: Reported on 11/05/2020) 30 tablet 0   No current facility-administered medications for this visit.    ALLERGIES:  Allergies  Allergen Reactions  . Lisinopril Cough    PHYSICAL EXAM:  Performance status (ECOG): 1 - Symptomatic but completely ambulatory  Vitals:   11/05/20 1151  BP: (!) 149/62  Pulse: 60  Resp: 16  SpO2: 100%   Wt Readings from Last 3 Encounters:  11/05/20 135 lb 11.2 oz (61.6 kg)  09/10/20 127 lb (57.6 kg)  08/26/20 124 lb (56.2 kg)   Physical Exam Vitals reviewed.  Constitutional:      Appearance: Normal appearance.  Cardiovascular:     Rate and Rhythm: Normal rate and regular rhythm.     Pulses: Normal pulses.     Heart sounds: Normal heart sounds.  Pulmonary:     Effort: Pulmonary effort is normal.     Breath sounds: Normal breath sounds.  Abdominal:     Palpations: Abdomen is soft. There is no hepatomegaly, splenomegaly or mass.     Tenderness: There is no abdominal tenderness.  Musculoskeletal:     Thoracic back: Tenderness (R lower flank TTP) present.  Neurological:     General: No focal deficit present.     Mental Status: She is alert and oriented to person, place, and time.    Psychiatric:        Mood and Affect: Mood normal.        Behavior: Behavior normal.      LABORATORY DATA:  I have reviewed the labs as listed.  CBC Latest Ref Rng & Units 11/05/2020 08/03/2020 07/15/2020  WBC 4.0 - 10.5 K/uL 5.8 5.2 7.3  Hemoglobin 12.0 - 15.0 g/dL 12.1 11.5(L) 11.0(L)  Hematocrit 36 - 46 % 37.8 36.0 34.2  Platelets 150 - 400 K/uL 155 155 335   CMP Latest Ref Rng & Units 11/05/2020 11/02/2020 08/03/2020  Glucose 70 - 99 mg/dL 88 - 95  BUN 8 - 23 mg/dL 31(H) - 17  Creatinine 0.44 - 1.00 mg/dL 1.05(H) 1.20(H) 1.07(H)  Sodium 135 - 145 mmol/L 136 - 139  Potassium 3.5 - 5.1 mmol/L 4.1 - 3.3(L)  Chloride 98 - 111 mmol/L 98 - 100  CO2 22 - 32 mmol/L 30 - 29  Calcium 8.9 - 10.3 mg/dL 9.9 - 9.5  Total Protein 6.5 - 8.1 g/dL 8.0 - 6.9  Total Bilirubin 0.3 - 1.2 mg/dL 0.6 - 0.6  Alkaline Phos 38 - 126 U/L 64 - 49  AST 15 - 41 U/L 21 - 13(L)  ALT 0 - 44 U/L 8 - 9   Lab Results  Component Value Date   CEA1 4.5 08/03/2020   CEA1 1.7 05/13/2020   CEA1 12.2 (H) 04/04/2020    DIAGNOSTIC IMAGING:  I have independently reviewed the scans and discussed with the patient. CT CHEST ABDOMEN PELVIS W CONTRAST  Result Date: 11/02/2020 CLINICAL DATA:  Colon cancer.  Restaging. EXAM: CT CHEST, ABDOMEN, AND PELVIS WITH CONTRAST TECHNIQUE: Multidetector CT imaging of the chest, abdomen and pelvis was  performed following the standard protocol during bolus administration of intravenous contrast. CONTRAST:  63m OMNIPAQUE IOHEXOL 300 MG/ML  SOLN COMPARISON:  Abdomen/pelvis CT 06/02/2020. FINDINGS: CT CHEST FINDINGS Cardiovascular: Heart size upper normal. Coronary artery calcification is evident. Atherosclerotic calcification is noted in the wall of the thoracic aorta. Left Port-A-Cath tip is positioned in the left innominate vein. Mediastinum/Nodes: No mediastinal lymphadenopathy. There is no hilar lymphadenopathy. The esophagus has normal imaging features. There is no axillary  lymphadenopathy. Lungs/Pleura: Scattered tiny bilateral pulmonary nodules are evident measuring up to 6 mm in the left lower lobe (82/4) and right lower lobe (75/4), not substantially changed in the interval no new suspicious nodule or mass. No focal airspace consolidation. No pleural effusion. Musculoskeletal: No worrisome lytic or sclerotic osseous abnormality. CT ABDOMEN PELVIS FINDINGS Hepatobiliary: No suspicious focal abnormality within the liver parenchyma. There is no evidence for gallstones, gallbladder wall thickening, or pericholecystic fluid. No intrahepatic or extrahepatic biliary dilation. Pancreas: No focal mass lesion. No dilatation of the main duct. No intraparenchymal cyst. No peripancreatic edema. Spleen: No splenomegaly. No focal mass lesion. Adrenals/Urinary Tract: No adrenal nodule or mass. Several hypoattenuating lesions in the right kidney are likely cysts. 7 mm exophytic lesion upper interpolar right kidney posteriorly (image 24/delay series 3) has attenuation too high to be a simple cyst. Similar high attenuation exophytic lesion noted upper pole right kidney measuring 9 mm on image 19/3. No evidence for hydroureter. Bladder is markedly distended with mild circumferential wall thickening. Stomach/Bowel: Stomach is unremarkable. No gastric wall thickening. No evidence of outlet obstruction. Duodenum is normally positioned as is the ligament of Treitz. No small bowel wall thickening. No small bowel dilatation. Insert right hemicolectomy Vascular/Lymphatic: There is abdominal aortic atherosclerosis without aneurysm. Mild lymphadenopathy noted hepatoduodenal ligament. 2.9 x 2.3 cm soft tissue lesion in the right mesentery just inferior to the liver is identified on 71/2. Additional 15 mm and 16 mm mesenteric soft tissue nodules are seen in the right abdomen on images 76 and 75, respectively. 3.1 x 1.7 cm abnormal soft tissue lesion identified in the central mesentery on image 69/2. Mild left  para-aortic lymphadenopathy visible on image 67/2. 12 mm short axis right external iliac node identified on 91/2 with a 9 mm short axis right common iliac node visible on 83/2. 16 mm short axis left external iliac node visible on 96/2. Reproductive: 2.9 x 2.5 cm low-density lesion along the posterior uterus is indeterminate and was not visible previously. There is no adnexal mass. Other: No intraperitoneal free fluid. Musculoskeletal: 17 mm soft tissue nodule identified in the right paramidline low anterior abdominal wall on 97/2. No worrisome lytic or sclerotic osseous abnormality. Motion artifact noted through the sternum on sagittal imaging. IMPRESSION: 1. Multiple soft tissue lesions in the central and right mesentery, measuring up to 3.1 x 1.7 cm. Mild lymphadenopathy in the hepatoduodenal ligament, retroperitoneal space, right common iliac chain and bilateral external iliac chains. Imaging features consistent with metastatic disease. 2. 17 mm soft tissue nodule in the low anterior abdominal wall. Metastatic deposit a concern. 3. Bilateral tiny pulmonary nodules, not substantially changed in the interval. Continued close attention recommended. 4. Bilateral renal cysts with high attenuation exophytic lesions in the upper kidneys bilaterally, having attenuation too high to be simple cysts. These may be cysts complicated by proteinaceous debris or hemorrhage, but neoplasm could have this appearance. Close attention on follow-up warranted. 5. 2.9 x 2.5 cm low-density lesion along the posterior uterus is indeterminate and was not visible previously.  Pelvic ultrasound may prove helpful to further evaluate. 6. Bladder is markedly distended with mild circumferential bladder wall thickening despite the distention. Component of bladder dysfunction or outlet obstruction not excluded. 7. Left Port-A-Cath tip is positioned in the left innominate vein. 8. Aortic Atherosclerosis (ICD10-I70.0). Electronically Signed   By: Misty Stanley M.D.   On: 11/02/2020 15:43   Intravitreal Injection, Pharmacologic Agent - OS - Left Eye  Result Date: 10/15/2020 Time Out 10/15/2020. 10:10 AM. Confirmed correct patient, procedure, site, and patient consented. Anesthesia Topical anesthesia was used. Anesthetic medications included Lidocaine 2%, Proparacaine 0.5%. Procedure Preparation included 5% betadine to ocular surface, eyelid speculum. A (33g) needle was used. Injection: 2 mg aflibercept Alfonse Flavors) SOLN   NDC: A3590391, Lot: [id number], Expiration date: 01/05/2021   Route: Intravitreal, Site: Left Eye, Waste: 0.05 mL Post-op Post injection exam found visual acuity of at least counting fingers. The patient tolerated the procedure well. There were no complications. The patient received written and verbal post procedure care education.   OCT, Retina - OU - Both Eyes  Result Date: 10/15/2020 Right Eye Quality was good. Central Foveal Thickness: 502. Progression has improved. Findings include pigment epithelial detachment, epiretinal membrane, subretinal hyper-reflective material, retinal drusen , abnormal foveal contour, no SRF, intraretinal fluid, disciform scar, outer retinal tubulation, outer retinal atrophy (Stable sub-retinal scar, mild interval improvement in  IRF overlying SRHM). Left Eye Quality was good. Central Foveal Thickness: 343. Progression has worsened. Findings include subretinal hyper-reflective material, pigment epithelial detachment, intraretinal fluid, retinal drusen , abnormal foveal contour, outer retinal atrophy, subretinal fluid (persistent IRF overlying SRHM/PED, shallow SRF overlying PED). Notes *Images captured and stored on drive Diagnosis / Impression: Exudative ARMD OU OD: Stable sub-retinal scar, persistent IRF overlying SRHM OS: persistent IRF overlying SRHM/PED, shallow SRF overlying PED Clinical management: See below Abbreviations: NFP - Normal foveal profile. CME - cystoid macular edema. PED - pigment  epithelial detachment. IRF - intraretinal fluid. SRF - subretinal fluid. EZ - ellipsoid zone. ERM - epiretinal membrane. ORA - outer retinal atrophy. ORT - outer retinal tubulation. SRHM - subretinal hyper-reflective material     ASSESSMENT:  1. Stage IIIb (T4AN1A) poorly differentiated right colon adenocarcinoma: -Right hemicolectomy on 04/07/2020 with poorly differentiated adenocarcinoma, pT4a, positive radial margin, 1/15 lymph nodes positive, loss of MLH1 and PMS2, bladder biopsy negative for malignancy. -CTAP on 04/06/2020 showed circumferential ascending colon mass with numerous borderline enlarged pericolonic lymph nodes. No findings of hepatic metastatic disease. -CEA on 04/04/2020 was 12.2. CEA improved to 1.7 on 05/13/2020. -PET scan on 05/11/2020 shows mild FDG uptake associated with the anastomotic site. Small pulmonary nodules that do not show elevated FDG activity, remain nonspecific, subcentimeter. Persistent but improved bladder wall thickening. -Cycle 1 of dose reduced FOLFOX on 05/19/2020, followed by 2 hospitalizations, 1 from acute kidney injury from diarrhea and second admission for C. difficile colitis. -Cycle 2 of 5-FU and leucovorin dose reduced on 06/30/2020.  Chemotherapy discontinued secondary to intolerance. -Follow-up CT scan on 11/02/2020 with multiple soft tissue lesions in the central and right mesentery measuring up to 3.1 x 1.7 cm.  Mild lymphadenopathy in the hepatic duodenal ligament, retroperitoneal space, right common iliac chain, bilateral external iliac chains.  17 mm soft tissue nodule in the lower anterior abdominal wall.  Bilateral tiny pulmonary nodules not substantially changed.  2.9 x 2.5 cm low-density lesion along the posterior uterus is indeterminate.  2. Family history: -Paternal uncle had colon cancer. Maternal aunt had brain cancer and another maternal aunt had  gynecological malignancy. Father had lung cancer and was a smoker.  3. Diffuse erythema  and nodularity of the dome of the bladder: -CT scan showed severely thickened bladder wall. -Cystoscopy on 04/08/2020 showed diffuse erythema, nodularity in the posterior wall tracking to the dome. Ureteral orifices were in normal locations. Biopsies were benign.   PLAN:  1.  Stage IV right colon adenocarcinoma, MSI-high: -Reviewed results from CT CAP from 11/02/2020. -Discussed various options including best supportive care in the form of hospice versus active therapy with immunotherapy.  Patient could not tolerate adjuvant FOLFOX in the past. -Discussed immunotherapy schedule with Keytruda every 3 weeks until progression or intolerance.  Discussed immunotherapy related side effects including colitis, pneumonitis, dermatitis, hypophysitis among others. -Follow-up on CEA levels from today.  Will not do a biopsy as the sites are not conducive. -We'll plan to repeat CT scan after four cycles.  We'll plan to repeat interim CEA levels.   2. Normocytic anemia: -Combination anemia from CKD and iron deficiency.  Last Feraheme was on 06/22/2020. -Hemoglobin today improved to 12.1.  3. Family history: -Genetic testing did not reveal any notable limitations.    Orders placed this encounter:  No orders of the defined types were placed in this encounter.    Derek Jack, MD [place] [phone]   I, Milinda Antis, am acting as a scribe for Dr. Sanda Linger.  I, Derek Jack MD, have reviewed the above documentation for accuracy and completeness, and I agree with the above.

## 2020-11-06 LAB — CEA: CEA: 5.9 ng/mL — ABNORMAL HIGH (ref 0.0–4.7)

## 2020-11-10 ENCOUNTER — Inpatient Hospital Stay (HOSPITAL_COMMUNITY): Payer: Medicare HMO

## 2020-11-10 ENCOUNTER — Other Ambulatory Visit: Payer: Self-pay

## 2020-11-10 ENCOUNTER — Encounter (HOSPITAL_COMMUNITY): Payer: Self-pay

## 2020-11-10 VITALS — BP 161/81 | HR 60 | Temp 96.9°F | Resp 18

## 2020-11-10 DIAGNOSIS — C189 Malignant neoplasm of colon, unspecified: Secondary | ICD-10-CM

## 2020-11-10 DIAGNOSIS — Z5112 Encounter for antineoplastic immunotherapy: Secondary | ICD-10-CM | POA: Diagnosis not present

## 2020-11-10 DIAGNOSIS — C182 Malignant neoplasm of ascending colon: Secondary | ICD-10-CM | POA: Diagnosis not present

## 2020-11-10 DIAGNOSIS — Z79899 Other long term (current) drug therapy: Secondary | ICD-10-CM | POA: Diagnosis not present

## 2020-11-10 LAB — CBC WITH DIFFERENTIAL/PLATELET
Abs Immature Granulocytes: 0.02 10*3/uL (ref 0.00–0.07)
Basophils Absolute: 0 10*3/uL (ref 0.0–0.1)
Basophils Relative: 0 %
Eosinophils Absolute: 0.2 10*3/uL (ref 0.0–0.5)
Eosinophils Relative: 4 %
HCT: 37.1 % (ref 36.0–46.0)
Hemoglobin: 11.8 g/dL — ABNORMAL LOW (ref 12.0–15.0)
Immature Granulocytes: 0 %
Lymphocytes Relative: 21 %
Lymphs Abs: 1.1 10*3/uL (ref 0.7–4.0)
MCH: 31 pg (ref 26.0–34.0)
MCHC: 31.8 g/dL (ref 30.0–36.0)
MCV: 97.4 fL (ref 80.0–100.0)
Monocytes Absolute: 0.5 10*3/uL (ref 0.1–1.0)
Monocytes Relative: 10 %
Neutro Abs: 3.4 10*3/uL (ref 1.7–7.7)
Neutrophils Relative %: 65 %
Platelets: 131 10*3/uL — ABNORMAL LOW (ref 150–400)
RBC: 3.81 MIL/uL — ABNORMAL LOW (ref 3.87–5.11)
RDW: 12.1 % (ref 11.5–15.5)
WBC: 5.2 10*3/uL (ref 4.0–10.5)
nRBC: 0 % (ref 0.0–0.2)

## 2020-11-10 LAB — COMPREHENSIVE METABOLIC PANEL
ALT: 8 U/L (ref 0–44)
AST: 21 U/L (ref 15–41)
Albumin: 4.1 g/dL (ref 3.5–5.0)
Alkaline Phosphatase: 67 U/L (ref 38–126)
Anion gap: 8 (ref 5–15)
BUN: 27 mg/dL — ABNORMAL HIGH (ref 8–23)
CO2: 31 mmol/L (ref 22–32)
Calcium: 9.7 mg/dL (ref 8.9–10.3)
Chloride: 101 mmol/L (ref 98–111)
Creatinine, Ser: 1 mg/dL (ref 0.44–1.00)
GFR, Estimated: 59 mL/min — ABNORMAL LOW (ref >60)
Glucose, Bld: 75 mg/dL (ref 70–99)
Potassium: 3.8 mmol/L (ref 3.5–5.1)
Sodium: 140 mmol/L (ref 135–145)
Total Bilirubin: 0.6 mg/dL (ref 0.3–1.2)
Total Protein: 7.8 g/dL (ref 6.5–8.1)

## 2020-11-10 LAB — TSH: TSH: 2.238 u[IU]/mL (ref 0.350–4.500)

## 2020-11-10 MED ORDER — HEPARIN SOD (PORK) LOCK FLUSH 100 UNIT/ML IV SOLN
500.0000 [IU] | Freq: Once | INTRAVENOUS | Status: AC | PRN
  Administered 2020-11-10: 500 [IU]

## 2020-11-10 MED ORDER — SODIUM CHLORIDE 0.9 % IV SOLN
200.0000 mg | Freq: Once | INTRAVENOUS | Status: AC
  Administered 2020-11-10: 200 mg via INTRAVENOUS
  Filled 2020-11-10: qty 8

## 2020-11-10 MED ORDER — SODIUM CHLORIDE 0.9% FLUSH
10.0000 mL | INTRAVENOUS | Status: DC | PRN
  Administered 2020-11-10: 10 mL

## 2020-11-10 MED ORDER — SODIUM CHLORIDE 0.9 % IV SOLN: Freq: Once | INTRAVENOUS | Status: AC

## 2020-11-10 NOTE — Progress Notes (Signed)
.   Pharmacist Chemotherapy Monitoring - Initial Assessment    Anticipated start date: 11/10/20   Regimen:  . Are orders appropriate based on the patient's diagnosis, regimen, and cycle? Yes . Does the plan date match the patient's scheduled date? Yes . Is the sequencing of drugs appropriate? Yes . Are the premedications appropriate for the patient's regimen? Yes . Prior Authorization for treatment is: Approved o If applicable, is the correct biosimilar selected based on the patient's insurance? not applicable  Organ Function and Labs: Marland Kitchen Are dose adjustments needed based on the patient's renal function, hepatic function, or hematologic function? No . Are appropriate labs ordered prior to the start of patient's treatment? Yes . Other organ system assessment, if indicated: N/A . The following baseline labs, if indicated, have been ordered: pembrolizumab: baseline TSH +/- T4  Dose Assessment: . Are the drug doses appropriate? Yes . Are the following correct: o Drug concentrations Yes o IV fluid compatible with drug Yes o Administration routes Yes o Timing of therapy Yes . If applicable, does the patient have documented access for treatment and/or plans for port-a-cath placement? yes . If applicable, have lifetime cumulative doses been properly documented and assessed? not applicable Lifetime Dose Tracking  . Oxaliplatin: 67.073 mg/m2 (110 mg) = 11.18 % of the maximum lifetime dose of 600 mg/m2  o   Toxicity Monitoring/Prevention: . The patient has the following take home antiemetics prescribed: N/A . The patient has the following take home medications prescribed: antimicrobial prophylaxis . Medication allergies and previous infusion related reactions, if applicable, have been reviewed and addressed. Yes . The patient's current medication list has been assessed for drug-drug interactions with their chemotherapy regimen. no significant drug-drug interactions were identified on  review.  Order Review: . Are the treatment plan orders signed? Yes . Is the patient scheduled to see a provider prior to their treatment? No  I verify that I have reviewed each item in the above checklist and answered each question accordingly.  Gabrielle [name] 11/10/2020 9:47 AM

## 2020-11-10 NOTE — Patient Instructions (Signed)
[facility] Discharge Instructions for Patients Receiving Chemotherapy  Today you received the following chemotherapy agents   To help prevent nausea and vomiting after your treatment, we encourage you to take your nausea medication   If you develop nausea and vomiting that is not controlled by your nausea medication, call the clinic.   BELOW ARE SYMPTOMS THAT SHOULD BE REPORTED IMMEDIATELY:  *FEVER GREATER THAN 100.5 F  *CHILLS WITH OR WITHOUT FEVER  NAUSEA AND VOMITING THAT IS NOT CONTROLLED WITH YOUR NAUSEA MEDICATION  *UNUSUAL SHORTNESS OF BREATH  *UNUSUAL BRUISING OR BLEEDING  TENDERNESS IN MOUTH AND THROAT WITH OR WITHOUT PRESENCE OF ULCERS  *URINARY PROBLEMS  *BOWEL PROBLEMS  UNUSUAL RASH Items with * indicate a potential emergency and should be followed up as soon as possible.  Feel free to call the clinic should you have any questions or concerns. The clinic phone number is [phone].  Please show the CHEMO ALERT CARD at check-in to the Emergency Department and triage nurse.   

## 2020-11-10 NOTE — Progress Notes (Signed)
D1C[facility] of [place]. Consent signed.  Labs obtained on 12/2 and WNL for treatment.  Baseline labs obtained today.   Tolerated treatment well today without incidence.  Vital signs stable prior to discharge.  Discharged ambulatory in stable condition.

## 2020-11-11 NOTE — Progress Notes (Signed)
11/11/20 1340 24 hour call back.  Patient has had no symptoms from her treatment and per patient is feeling good.

## 2020-11-12 ENCOUNTER — Other Ambulatory Visit: Payer: Self-pay

## 2020-11-12 ENCOUNTER — Encounter (INDEPENDENT_AMBULATORY_CARE_PROVIDER_SITE_OTHER): Payer: Self-pay | Admitting: Ophthalmology

## 2020-11-12 ENCOUNTER — Ambulatory Visit (INDEPENDENT_AMBULATORY_CARE_PROVIDER_SITE_OTHER): Payer: Medicare HMO | Admitting: Ophthalmology

## 2020-11-12 DIAGNOSIS — Z961 Presence of intraocular lens: Secondary | ICD-10-CM

## 2020-11-12 DIAGNOSIS — H353231 Exudative age-related macular degeneration, bilateral, with active choroidal neovascularization: Secondary | ICD-10-CM | POA: Diagnosis not present

## 2020-11-12 DIAGNOSIS — H40053 Ocular hypertension, bilateral: Secondary | ICD-10-CM

## 2020-11-12 DIAGNOSIS — H43813 Vitreous degeneration, bilateral: Secondary | ICD-10-CM | POA: Diagnosis not present

## 2020-11-12 DIAGNOSIS — H3581 Retinal edema: Secondary | ICD-10-CM

## 2020-11-12 MED ORDER — AFLIBERCEPT 2MG/0.05ML IZ SOLN FOR KALEIDOSCOPE
2.0000 mg | INTRAVITREAL | Status: AC | PRN
  Administered 2020-11-12: 2 mg via INTRAVITREAL

## 2020-11-20 DIAGNOSIS — M6281 Muscle weakness (generalized): Secondary | ICD-10-CM | POA: Diagnosis not present

## 2020-11-20 DIAGNOSIS — M48061 Spinal stenosis, lumbar region without neurogenic claudication: Secondary | ICD-10-CM | POA: Diagnosis not present

## 2020-11-20 DIAGNOSIS — I69898 Other sequelae of other cerebrovascular disease: Secondary | ICD-10-CM | POA: Diagnosis not present

## 2020-11-20 DIAGNOSIS — Z85038 Personal history of other malignant neoplasm of large intestine: Secondary | ICD-10-CM | POA: Diagnosis not present

## 2020-11-30 NOTE — Progress Notes (Signed)
Gabrielle [name], [place] [id number]   CLINIC:  Medical Oncology/Hematology  PCP:  Janora Norlander, DO [address] [place] [address] [phone]   REASON FOR VISIT:  Follow-up for right colon cancer  PRIOR THERAPY:  1. Right hemicolectomy on 04/08/2020. 2. FOLFOX x 3 cycles from 05/19/2020 to 07/14/2020.  NGS Results: Foundation 1 KRAS/NRAS wild-type, MSI--high, TMB 55 Muts/Mb, BRAF V 600 E  CURRENT THERAPY: Keytruda every 3 weeks  BRIEF ONCOLOGIC HISTORY:  Oncology History  Malignant neoplasm of colon ([facility])  04/07/2020 Initial Diagnosis   Malignant neoplasm of ascending colon ([facility])   05/13/2020 Genetic Testing   Foundation One     05/19/2020 - 07/02/2020 Chemotherapy   The patient had palonosetron (ALOXI) injection 0.25 mg, 0.25 mg, Intravenous,  Once, 2 of 12 cycles Administration: 0.25 mg (05/19/2020), 0.25 mg (06/30/2020) leucovorin 522 mg in dextrose 5 % 250 mL infusion, 320 mg/m2 = 522 mg (80 % of original dose 400 mg/m2), Intravenous,  Once, 2 of 12 cycles Dose modification: 320 mg/m2 (80 % of original dose 400 mg/m2, Cycle 1, Reason: Provider Judgment), 818.2993 mg/m2 (66.7 % of original dose 400 mg/m2, Cycle 2, Reason: Provider Judgment) Administration: 522 mg (05/19/2020), 434 mg (06/30/2020) oxaliplatin (ELOXATIN) 110 mg in dextrose 5 % 500 mL chemo infusion, 68 mg/m2 = 110 mg (80 % of original dose 85 mg/m2), Intravenous,  Once, 1 of 1 cycle Dose modification: 68 mg/m2 (80 % of original dose 85 mg/m2, Cycle 1, Reason: Provider Judgment) Administration: 110 mg (05/19/2020) fluorouracil (ADRUCIL) chemo injection 500 mg, 320 mg/m2 = 500 mg (80 % of original dose 400 mg/m2), Intravenous,  Once, 2 of 12 cycles Dose modification: 320 mg/m2 (80 % of original dose 400 mg/m2, Cycle 1, Reason: Provider Judgment), 716.9678 mg/m2 (66.7 % of original dose 400 mg/m2, Cycle 2, Reason: Provider Judgment) Administration: 500 mg (05/19/2020), 450  mg (06/30/2020) fluorouracil (ADRUCIL) 3,150 mg in sodium chloride 0.9 % 87 mL chemo infusion, 1,920 mg/m2 = 3,150 mg (80 % of original dose 2,400 mg/m2), Intravenous, 1 Day/Dose, 2 of 12 cycles Dose modification: 1,920 mg/m2 (80 % of original dose 2,400 mg/m2, Cycle 1, Reason: Provider Judgment), 1,600 mg/m2 (66.7 % of original dose 2,400 mg/m2, Cycle 2, Reason: Provider Judgment) Administration: 3,150 mg (05/19/2020), 2,600 mg (06/30/2020)  for chemotherapy treatment.    08/03/2020 Genetic Testing   Guardant Reveal Testing     08/14/2020 Genetic Testing   No pathogenic variants detected in [facility] Common Hereditary Cancers Panel.  Variant of uncertain significance (VUS) detected in HOXB13 at c.634G>A (p.Ala212Thr). The Common Hereditary Cancers Panel offered by [facility] includes sequencing and/or deletion duplication testing of the following 48 genes: APC, ATM, AXIN2, BARD1, BMPR1A, BRCA1, BRCA2, BRIP1, CDH1, CDK4, CDKN2A (p14ARF), CDKN2A (p16INK4a), CHEK2, CTNNA1, DICER1, EPCAM (Deletion/duplication testing only), GREM1 (promoter region deletion/duplication testing only), KIT, MEN1, MLH1, MSH2, MSH3, MSH6, MUTYH, NBN, NF1, NHTL1, PALB2, PDGFRA, PMS2, POLD1, POLE, PTEN, RAD50, RAD51C, RAD51D, RNF43, SDHB, SDHC, SDHD, SMAD4, SMARCA4. STK11, TP53, TSC1, TSC2, and VHL.  The following genes were evaluated for sequence changes only: SDHA and HOXB13 c.251G>A variant only. The report date is August 14, 2020.    11/05/2020 Cancer Staging   Staging form: Colon and Rectum, AJCC 8th Edition - Pathologic stage from 11/05/2020: Stage IVC (rpTX, pN0, pM1c) - Signed by Derek Jack, MD on 11/05/2020   11/10/2020 -  Chemotherapy   The patient had pembrolizumab (KEYTRUDA) 200 mg in sodium chloride  0.9 % 50 mL chemo infusion, 200 mg, Intravenous, Once, 1 of 4 cycles Administration: 200 mg (11/10/2020)  for chemotherapy treatment.      CANCER STAGING: Cancer Staging Malignant neoplasm of colon  [facility]) Staging form: Colon and Rectum, AJCC 8th Edition - Clinical stage from 04/27/2020: Stage IIIB (cT4a, cN1a, cM0) - Unsigned - Pathologic stage from 11/05/2020: Stage IVC (rpTX, pN0, pM1c) - Signed by Doreatha Massed, MD on 11/05/2020   INTERVAL HISTORY:  Gabrielle [name], a 74 y.o. female, returns for routine follow-up and consideration for next cycle of chemotherapy. Beautifull was last seen on 11/05/2020.  Due for cycle #2 of Keytruda today.   Overall, she tells me she has been feeling pretty well.   Overall, she feels ready for next cycle of chemo today.    REVIEW OF SYSTEMS:  Review of Systems - Oncology  PAST MEDICAL/SURGICAL HISTORY:  Past Medical History:  Diagnosis Date  . Arthritis   . Cancer (HCC)   . Cataract    OU  . Colon cancer (HCC)   . Family history of pancreatic cancer 08/21/2020  . Family history of uterine cancer 08/21/2020  . H/O cesarean section   . Hx of tonsillectomy   . Hyperlipidemia   . Hypertension   . Macular degeneration    Exu ARMD OU  . Paroxysmal atrial fibrillation (HCC) 07/05/2020   Past Surgical History:  Procedure Laterality Date  . BIOPSY  04/06/2020   Procedure: BIOPSY;  Surgeon: Malissa Hippo, MD;  Location[facility];  Service: Endoscopy;;  . CARPAL TUNNEL RELEASE Right   . CATARACT EXTRACTION W/PHACO Left 10/11/2019   Procedure: CATARACT EXTRACTION PHACO AND INTRAOCULAR LENS PLACEMENT (IOC);  Surgeon: Fabio Pierce, MD;  Location: [facility] ORS;  Service: Ophthalmology;  Laterality: Left;  CDE: 8.56  . CATARACT EXTRACTION W/PHACO Right 10/25/2019   Procedure: CATARACT EXTRACTION PHACO AND INTRAOCULAR LENS PLACEMENT (IOC);  Surgeon: Fabio Pierce, MD;  Location: [facility] ORS;  Service: Ophthalmology;  Laterality: Right;  CDE: 5.67  . CESAREAN SECTION    . COLONOSCOPY N/A 04/06/2020   Procedure: COLONOSCOPY;  Surgeon: Malissa Hippo, MD;  Location: [facility];  Service: Endoscopy;  Laterality: N/A;  . CYSTOSCOPY WITH BIOPSY N/A  04/08/2020   Procedure: CYSTOSCOPY WITH BIOPSY;  Surgeon: Malen Gauze, MD;  Location: [facility] OR[facility];  Service: Urology;  Laterality: N/A;  . ESOPHAGOGASTRODUODENOSCOPY N/A 04/05/2020   Procedure: ESOPHAGOGASTRODUODENOSCOPY (EGD);  Surgeon: Malissa Hippo, MD;  Location[facility];  Service: Endoscopy;  Laterality: N/A;  . PARTIAL COLECTOMY N/A 04/08/2020   Procedure: PARTIAL COLECTOMY;  Surgeon: Lucretia Roers, MD;  Location: [facility] ORS;  Service: General;  Laterality: N/A;  . PORTACATH PLACEMENT Left 05/18/2020   Procedure: INSERTION PORT-A-CATH (ATTACHED CATHETER IN LEFT SUBCLAVIAN);  Surgeon: Lucretia Roers, MD;  Location: [facility] ORS;  Service: General;  Laterality: Left;  . TONSILLECTOMY      SOCIAL HISTORY:  Social History   Socioeconomic History  . Marital status: Divorced    Spouse name: Not on file  . Number of children: Not on file  . Years of education: Not on file  . Highest education level: Not on file  Occupational History  . Not on file  Tobacco Use  . Smoking status: Never Smoker  . Smokeless tobacco: Never Used  Vaping Use  . Vaping Use: Never used  Substance and Sexual Activity  . Alcohol use: No  . Drug use: No  . Sexual activity: Not  Currently  Other Topics Concern  . Not on file  Social History Narrative  . Not on file   Social Determinants of Health   Financial Resource Strain: Medium Risk  . Difficulty of Paying Living Expenses: Somewhat hard  Food Insecurity: Food Insecurity Present  . Worried About Charity fundraiser in the Last Year: Sometimes true  . Ran Out of Food in the Last Year: Never true  Transportation Needs: Unmet Transportation Needs  . Lack of Transportation (Medical): Yes  . Lack of Transportation (Non-Medical): Yes  Physical Activity: Insufficiently Active  . Days of Exercise per Week: 7 days  . Minutes of Exercise per Session: 20 min  Stress: Stress Concern Present  . Feeling of Stress : Very much  Social Connections:  Moderately Integrated  . Frequency of Communication with Friends and Family: More than three times a week  . Frequency of Social Gatherings with Friends and Family: Once a week  . Attends Religious Services: More than 4 times per year  . Active Member of Clubs or Organizations: Yes  . Attends Archivist Meetings: Never  . Marital Status: Divorced  Human resources officer Violence: Not At Risk  . Fear of Current or Ex-Partner: No  . Emotionally Abused: No  . Physically Abused: No  . Sexually Abused: No    FAMILY HISTORY:  Family History  Problem Relation Age of Onset  . Macular degeneration Mother   . Stroke Mother   . Hypertension Mother   . Dementia Mother   . Lung cancer Father        dx late 62s; smoking hx  . Diabetes Sister   . Hypertension Sister   . Leukemia Paternal Uncle        d. 77s  . Cancer Maternal Aunt        ovarian or endometrial dx 59s  . Pancreatic cancer Paternal Uncle        d. late 6s    CURRENT MEDICATIONS:  Current Outpatient Medications  Medication Sig Dispense Refill  . alendronate (FOSAMAX) 70 MG tablet Take 1 tablet (70 mg total) by mouth every 7 (seven) days. Take with a full glass of water on an empty stomach. 4 tablet 11  . amLODipine (NORVASC) 5 MG tablet Take 1 tablet (5 mg total) by mouth daily. 90 tablet 3  . atorvastatin (LIPITOR) 20 MG tablet Take 1 tablet (20 mg total) by mouth daily. 90 tablet 3  . cholecalciferol (VITAMIN D3) 25 MCG (1000 UT) tablet Take 1,000 Units by mouth daily.    Marland Kitchen CRANBERRY FRUIT PO Take 1 tablet by mouth daily.    . diclofenac Sodium (VOLTAREN) 1 % GEL Apply 2 g topically 4 (four) times daily.     . dorzolamide-timolol (COSOPT) 22.3-6.8 MG/ML ophthalmic solution PLACE 1 DROP INTO BOTH EYES 2 TIMES A DAY 10 mL 0  . FEROSUL 325 (65 Fe) MG tablet TAKE  (1)  TABLET TWICE A DAY WITH MEALS (BREAKFAST AND SUPPER) 180 tablet 0  . Garlic 761 MG TABS Take 100 mg by mouth daily.    Marland Kitchen LEUCOVORIN CALCIUM IV Inject  into the vein every 14 (fourteen) days.     Marland Kitchen lidocaine-prilocaine (EMLA) cream Apply a small amount to port a cath site and cover with plastic wrap 1 hour prior to chemotherapy appointments (Patient not taking: Reported on 11/05/2020) 30 g 0  . losartan-hydrochlorothiazide (HYZAAR) 100-25 MG tablet Take 1 tablet by mouth daily.    . OXALIPLATIN IV Inject  into the vein every 14 (fourteen) days.     . potassium chloride SA (KLOR-CON) 20 MEQ tablet Take 1 tablet (20 mEq total) by mouth daily. 90 tablet 1  . prochlorperazine (COMPAZINE) 10 MG tablet Take 1 tablet (10 mg total) by mouth every 6 (six) hours as needed for nausea or vomiting. (Patient not taking: Reported on 11/05/2020) 30 tablet 0  . saccharomyces boulardii (FLORASTOR) 250 MG capsule Take by mouth.      No current facility-administered medications for this visit.    ALLERGIES:  Allergies  Allergen Reactions  . Lisinopril Cough    PHYSICAL EXAM:  Performance status (ECOG): 1 - Symptomatic but completely ambulatory  There were no vitals filed for this visit. Wt Readings from Last 3 Encounters:  11/10/20 138 lb (62.6 kg)  11/05/20 135 lb 11.2 oz (61.6 kg)  09/10/20 127 lb (57.6 kg)   Physical Exam  LABORATORY DATA:  I have reviewed the labs as listed.  CBC Latest Ref Rng & Units 11/10/2020 11/05/2020 08/03/2020  WBC 4.0 - 10.5 K/uL 5.2 5.8 5.2  Hemoglobin 12.0 - 15.0 g/dL 11.8(L) 12.1 11.5(L)  Hematocrit 36.0 - 46.0 % 37.1 37.8 36.0  Platelets 150 - 400 K/uL 131(L) 155 155   CMP Latest Ref Rng & Units 11/10/2020 11/05/2020 11/02/2020  Glucose 70 - 99 mg/dL 75 88 -  BUN 8 - 23 mg/dL 27(H) 31(H) -  Creatinine 0.44 - 1.00 mg/dL 1.00 1.05(H) 1.20(H)  Sodium 135 - 145 mmol/L 140 136 -  Potassium 3.5 - 5.1 mmol/L 3.8 4.1 -  Chloride 98 - 111 mmol/L 101 98 -  CO2 22 - 32 mmol/L 31 30 -  Calcium 8.9 - 10.3 mg/dL 9.7 9.9 -  Total Protein 6.5 - 8.1 g/dL 7.8 8.0 -  Total Bilirubin 0.3 - 1.2 mg/dL 0.6 0.6 -  Alkaline Phos 38 - 126  U/L 67 64 -  AST 15 - 41 U/L 21 21 -  ALT 0 - 44 U/L 8 8 -    DIAGNOSTIC IMAGING:  I have independently reviewed the scans and discussed with the patient. CT CHEST ABDOMEN PELVIS W CONTRAST  Result Date: 11/02/2020 CLINICAL DATA:  Colon cancer.  Restaging. EXAM: CT CHEST, ABDOMEN, AND PELVIS WITH CONTRAST TECHNIQUE: Multidetector CT imaging of the chest, abdomen and pelvis was performed following the standard protocol during bolus administration of intravenous contrast. CONTRAST:  20mL OMNIPAQUE IOHEXOL 300 MG/ML  SOLN COMPARISON:  Abdomen/pelvis CT 06/02/2020. FINDINGS: CT CHEST FINDINGS Cardiovascular: Heart size upper normal. Coronary artery calcification is evident. Atherosclerotic calcification is noted in the wall of the thoracic aorta. Left Port-A-Cath tip is positioned in the left innominate vein. Mediastinum/Nodes: No mediastinal lymphadenopathy. There is no hilar lymphadenopathy. The esophagus has normal imaging features. There is no axillary lymphadenopathy. Lungs/Pleura: Scattered tiny bilateral pulmonary nodules are evident measuring up to 6 mm in the left lower lobe (82/4) and right lower lobe (75/4), not substantially changed in the interval no new suspicious nodule or mass. No focal airspace consolidation. No pleural effusion. Musculoskeletal: No worrisome lytic or sclerotic osseous abnormality. CT ABDOMEN PELVIS FINDINGS Hepatobiliary: No suspicious focal abnormality within the liver parenchyma. There is no evidence for gallstones, gallbladder wall thickening, or pericholecystic fluid. No intrahepatic or extrahepatic biliary dilation. Pancreas: No focal mass lesion. No dilatation of the main duct. No intraparenchymal cyst. No peripancreatic edema. Spleen: No splenomegaly. No focal mass lesion. Adrenals/Urinary Tract: No adrenal nodule or mass. Several hypoattenuating lesions in the right kidney are likely cysts.  7 mm exophytic lesion upper interpolar right kidney posteriorly (image  24/delay series 3) has attenuation too high to be a simple cyst. Similar high attenuation exophytic lesion noted upper pole right kidney measuring 9 mm on image 19/3. No evidence for hydroureter. Bladder is markedly distended with mild circumferential wall thickening. Stomach/Bowel: Stomach is unremarkable. No gastric wall thickening. No evidence of outlet obstruction. Duodenum is normally positioned as is the ligament of Treitz. No small bowel wall thickening. No small bowel dilatation. Insert right hemicolectomy Vascular/Lymphatic: There is abdominal aortic atherosclerosis without aneurysm. Mild lymphadenopathy noted hepatoduodenal ligament. 2.9 x 2.3 cm soft tissue lesion in the right mesentery just inferior to the liver is identified on 71/2. Additional 15 mm and 16 mm mesenteric soft tissue nodules are seen in the right abdomen on images 76 and 75, respectively. 3.1 x 1.7 cm abnormal soft tissue lesion identified in the central mesentery on image 69/2. Mild left para-aortic lymphadenopathy visible on image 67/2. 12 mm short axis right external iliac node identified on 91/2 with a 9 mm short axis right common iliac node visible on 83/2. 16 mm short axis left external iliac node visible on 96/2. Reproductive: 2.9 x 2.5 cm low-density lesion along the posterior uterus is indeterminate and was not visible previously. There is no adnexal mass. Other: No intraperitoneal free fluid. Musculoskeletal: 17 mm soft tissue nodule identified in the right paramidline low anterior abdominal wall on 97/2. No worrisome lytic or sclerotic osseous abnormality. Motion artifact noted through the sternum on sagittal imaging. IMPRESSION: 1. Multiple soft tissue lesions in the central and right mesentery, measuring up to 3.1 x 1.7 cm. Mild lymphadenopathy in the hepatoduodenal ligament, retroperitoneal space, right common iliac chain and bilateral external iliac chains. Imaging features consistent with metastatic disease. 2. 17 mm soft  tissue nodule in the low anterior abdominal wall. Metastatic deposit a concern. 3. Bilateral tiny pulmonary nodules, not substantially changed in the interval. Continued close attention recommended. 4. Bilateral renal cysts with high attenuation exophytic lesions in the upper kidneys bilaterally, having attenuation too high to be simple cysts. These may be cysts complicated by proteinaceous debris or hemorrhage, but neoplasm could have this appearance. Close attention on follow-up warranted. 5. 2.9 x 2.5 cm low-density lesion along the posterior uterus is indeterminate and was not visible previously. Pelvic ultrasound may prove helpful to further evaluate. 6. Bladder is markedly distended with mild circumferential bladder wall thickening despite the distention. Component of bladder dysfunction or outlet obstruction not excluded. 7. Left Port-A-Cath tip is positioned in the left innominate vein. 8. Aortic Atherosclerosis (ICD10-I70.0). Electronically Signed   By: Misty Stanley M.D.   On: 11/02/2020 15:43   Intravitreal Injection, Pharmacologic Agent - OS - Left Eye  Result Date: 11/12/2020 Time Out 11/12/2020. 10:09 AM. Confirmed correct patient, procedure, site, and patient consented. Anesthesia Topical anesthesia was used. Anesthetic medications included Lidocaine 2%, Proparacaine 0.5%. Procedure Preparation included 5% betadine to ocular surface, eyelid speculum. A (33g) needle was used. Injection: 2 mg aflibercept Alfonse Flavors) SOLN   NDC: [id number], Lot: [id number], Expiration date: 03/04/2021   Route: Intravitreal, Site: Left Eye, Waste: 0.05 mL Post-op Post injection exam found visual acuity of at least counting fingers. The patient tolerated the procedure well. There were no complications. The patient received written and verbal post procedure care education. Post injection medications were not given.   OCT, Retina - OU - Both Eyes  Result Date: 11/12/2020 Right Eye Quality was good. Central Foveal  Thickness: 489. Progression has  improved. Findings include pigment epithelial detachment, epiretinal membrane, subretinal hyper-reflective material, retinal drusen , abnormal foveal contour, no SRF, intraretinal fluid, disciform scar, outer retinal tubulation, outer retinal atrophy (Stable sub-retinal scar, mild interval improvement in cystic changes). Left Eye Quality was good. Central Foveal Thickness: 352. Progression has improved. Findings include subretinal hyper-reflective material, pigment epithelial detachment, intraretinal fluid, retinal drusen , abnormal foveal contour, outer retinal atrophy, subretinal fluid (Interval improvement in central SRHM and overlying IRF/cystic changes, shallow SRF overlying PED). Notes *Images captured and stored on drive Diagnosis / Impression: Exudative ARMD OU OD: Stable sub-retinal scar, Stable sub-retinal scar, mild interval improvement in cystic changes OS: Interval improvement in central SRHM and overlying IRF/cystic changes, shallow SRF overlying PED Clinical management: See below Abbreviations: NFP - Normal foveal profile. CME - cystoid macular edema. PED - pigment epithelial detachment. IRF - intraretinal fluid. SRF - subretinal fluid. EZ - ellipsoid zone. ERM - epiretinal membrane. ORA - outer retinal atrophy. ORT - outer retinal tubulation. SRHM - subretinal hyper-reflective material     ASSESSMENT:  1. Stage IIIb (T4AN1A) poorly differentiated right colon adenocarcinoma: -Right hemicolectomy on 04/07/2020 with poorly differentiated adenocarcinoma, pT4a, positive radial margin, 1/15 lymph nodes positive, loss of MLH1 and PMS2, bladder biopsy negative for malignancy. -CTAP on 04/06/2020 showed circumferential ascending colon mass with numerous borderline enlarged pericolonic lymph nodes. No findings of hepatic metastatic disease. -CEA on 04/04/2020 was 12.2. CEA improved to 1.7 on 05/13/2020. -PET scan on 05/11/2020 shows mild FDG uptake associated with the  anastomotic site. Small pulmonary nodules that do not show elevated FDG activity, remain nonspecific, subcentimeter. Persistent but improved bladder wall thickening. -Cycle 1 of dose reduced FOLFOX on 05/19/2020, followed by 2 hospitalizations, 1 from acute kidney injury from diarrhea and second admission for C. difficile colitis. -Cycle 2 of 5-FU and leucovorin dose reduced on 06/30/2020.  Chemotherapy discontinued secondary to intolerance. -Follow-up CT scan on 11/02/2020 with multiple soft tissue lesions in the central and right mesentery measuring up to 3.1 x 1.7 cm.  Mild lymphadenopathy in the hepatic duodenal ligament, retroperitoneal space, right common iliac chain, bilateral external iliac chains.  17 mm soft tissue nodule in the lower anterior abdominal wall.  Bilateral tiny pulmonary nodules not substantially changed.  2.9 x 2.5 cm low-density lesion along the posterior uterus is indeterminate. -Pembrolizumab started on 11/10/2020.  2. Family history: -Paternal uncle had colon cancer. Maternal aunt had brain cancer and another maternal aunt had gynecological malignancy. Father had lung cancer and was a smoker.  3. Diffuse erythema and nodularity of the dome of the bladder: -CT scan showed severely thickened bladder wall. -Cystoscopy on 04/08/2020 showed diffuse erythema, nodularity in the posterior wall tracking to the dome. Ureteral orifices were in normal locations. Biopsies were benign.   PLAN:  1.  Stage IV right colon adenocarcinoma, MSI-high: -She has tolerated first cycle of pembrolizumab very well. -Reviewed labs which showed mild thrombocytopenia of 122.  LFTs are normal. -Did not have any diarrhea.  TSH was 1.8.  Last CEA was 5.9. -We will proceed with pembrolizumab today.  Plan to repeat scans after 4 cycles.   2. Normocytic anemia: -Combination anemia from CKD and iron deficiency.  Hemoglobin today is 12.2.  3. Family history: -Genetic testing did not reveal  any notable mutations.   Orders placed this encounter:  No orders of the defined types were placed in this encounter.    Derek Jack, MD [place] [phone]   I, Milinda Antis, am acting  as a scribe for Dr. Sanda Linger.  I, Derek Jack MD, have reviewed the above documentation for accuracy and completeness, and I agree with the above.

## 2020-12-01 ENCOUNTER — Other Ambulatory Visit: Payer: Self-pay

## 2020-12-01 ENCOUNTER — Inpatient Hospital Stay (HOSPITAL_COMMUNITY): Payer: Medicare HMO | Attending: Hematology

## 2020-12-01 ENCOUNTER — Inpatient Hospital Stay (HOSPITAL_COMMUNITY): Payer: Medicare HMO

## 2020-12-01 ENCOUNTER — Inpatient Hospital Stay (HOSPITAL_BASED_OUTPATIENT_CLINIC_OR_DEPARTMENT_OTHER): Payer: Medicare HMO | Admitting: Hematology

## 2020-12-01 VITALS — BP 173/59 | HR 70 | Temp 96.4°F | Resp 18 | Wt 141.3 lb

## 2020-12-01 VITALS — BP 163/67 | HR 67 | Temp 96.8°F | Resp 18

## 2020-12-01 DIAGNOSIS — C189 Malignant neoplasm of colon, unspecified: Secondary | ICD-10-CM

## 2020-12-01 DIAGNOSIS — C182 Malignant neoplasm of ascending colon: Secondary | ICD-10-CM

## 2020-12-01 DIAGNOSIS — Z5112 Encounter for antineoplastic immunotherapy: Secondary | ICD-10-CM | POA: Insufficient documentation

## 2020-12-01 DIAGNOSIS — Z79899 Other long term (current) drug therapy: Secondary | ICD-10-CM | POA: Diagnosis not present

## 2020-12-01 LAB — CBC WITH DIFFERENTIAL/PLATELET
Abs Immature Granulocytes: 0.02 10*3/uL (ref 0.00–0.07)
Basophils Absolute: 0 10*3/uL (ref 0.0–0.1)
Basophils Relative: 0 %
Eosinophils Absolute: 0.3 10*3/uL (ref 0.0–0.5)
Eosinophils Relative: 5 %
HCT: 38.1 % (ref 36.0–46.0)
Hemoglobin: 12.2 g/dL (ref 12.0–15.0)
Immature Granulocytes: 0 %
Lymphocytes Relative: 18 %
Lymphs Abs: 1.1 10*3/uL (ref 0.7–4.0)
MCH: 30.9 pg (ref 26.0–34.0)
MCHC: 32 g/dL (ref 30.0–36.0)
MCV: 96.5 fL (ref 80.0–100.0)
Monocytes Absolute: 0.5 10*3/uL (ref 0.1–1.0)
Monocytes Relative: 8 %
Neutro Abs: 4.2 10*3/uL (ref 1.7–7.7)
Neutrophils Relative %: 69 %
Platelets: 122 10*3/uL — ABNORMAL LOW (ref 150–400)
RBC: 3.95 MIL/uL (ref 3.87–5.11)
RDW: 12.9 % (ref 11.5–15.5)
WBC: 6.1 10*3/uL (ref 4.0–10.5)
nRBC: 0 % (ref 0.0–0.2)

## 2020-12-01 LAB — COMPREHENSIVE METABOLIC PANEL
ALT: 10 U/L (ref 0–44)
AST: 17 U/L (ref 15–41)
Albumin: 4.1 g/dL (ref 3.5–5.0)
Alkaline Phosphatase: 65 U/L (ref 38–126)
Anion gap: 7 (ref 5–15)
BUN: 30 mg/dL — ABNORMAL HIGH (ref 8–23)
CO2: 30 mmol/L (ref 22–32)
Calcium: 9.6 mg/dL (ref 8.9–10.3)
Chloride: 101 mmol/L (ref 98–111)
Creatinine, Ser: 0.91 mg/dL (ref 0.44–1.00)
GFR, Estimated: >60 mL/min (ref >60)
Glucose, Bld: 95 mg/dL (ref 70–99)
Potassium: 4.2 mmol/L (ref 3.5–5.1)
Sodium: 138 mmol/L (ref 135–145)
Total Bilirubin: 0.5 mg/dL (ref 0.3–1.2)
Total Protein: 7.7 g/dL (ref 6.5–8.1)

## 2020-12-01 LAB — TSH: TSH: 1.811 u[IU]/mL (ref 0.350–4.500)

## 2020-12-01 MED ORDER — HEPARIN SOD (PORK) LOCK FLUSH 100 UNIT/ML IV SOLN
500.0000 [IU] | Freq: Once | INTRAVENOUS | Status: AC | PRN
  Administered 2020-12-01: 12:00:00 500 [IU]

## 2020-12-01 MED ORDER — SODIUM CHLORIDE 0.9% FLUSH
10.0000 mL | INTRAVENOUS | Status: DC | PRN
  Administered 2020-12-01: 12:00:00 10 mL

## 2020-12-01 MED ORDER — SODIUM CHLORIDE 0.9 % IV SOLN: Freq: Once | INTRAVENOUS | Status: AC

## 2020-12-01 MED ORDER — SODIUM CHLORIDE 0.9 % IV SOLN
200.0000 mg | Freq: Once | INTRAVENOUS | Status: AC
  Administered 2020-12-01: 12:00:00 200 mg via INTRAVENOUS
  Filled 2020-12-01: qty 8

## 2020-12-01 NOTE — Patient Instructions (Signed)
[facility] Discharge Instructions for Patients Receiving Chemotherapy  Today you received the following chemotherapy agents   To help prevent nausea and vomiting after your treatment, we encourage you to take your nausea medication   If you develop nausea and vomiting that is not controlled by your nausea medication, call the clinic.   BELOW ARE SYMPTOMS THAT SHOULD BE REPORTED IMMEDIATELY:  *FEVER GREATER THAN 100.5 F  *CHILLS WITH OR WITHOUT FEVER  NAUSEA AND VOMITING THAT IS NOT CONTROLLED WITH YOUR NAUSEA MEDICATION  *UNUSUAL SHORTNESS OF BREATH  *UNUSUAL BRUISING OR BLEEDING  TENDERNESS IN MOUTH AND THROAT WITH OR WITHOUT PRESENCE OF ULCERS  *URINARY PROBLEMS  *BOWEL PROBLEMS  UNUSUAL RASH Items with * indicate a potential emergency and should be followed up as soon as possible.  Feel free to call the clinic should you have any questions or concerns. The clinic phone number is [phone].  Please show the CHEMO ALERT CARD at check-in to the Emergency Department and triage nurse.   

## 2020-12-01 NOTE — Progress Notes (Signed)
Patients port flushed without difficulty.  Good blood return noted with no bruising or swelling noted at site.  Transparent dressing applied.  Patient left accessed for chemotherapy treatment. 

## 2020-12-01 NOTE — Progress Notes (Signed)
Pt here for D1C2 of keytruda.  Labs WNL for treatment.  Good for treatment.   Tolerated treatment well today without incidence.  Vital signs stable prior to discharge.  Discharged ambulatory in stable condition.

## 2020-12-01 NOTE — Patient Instructions (Signed)
[place] at [facility] Discharge Instructions  You were seen by Dr. Delton Coombes today. Follow up as scheduled.   Thank you for choosing [place] at [facility] to provide your oncology and hematology care.  To afford each patient quality time with our provider, please arrive at least 15 minutes before your scheduled appointment time.   If you have a lab appointment with the [facility] please come in thru the Main Entrance and check in at the main information desk.  You need to re-schedule your appointment should you arrive 10 or more minutes late.  We strive to give you quality time with our providers, and arriving late affects you and other patients whose appointments are after yours.  Also, if you no show three or more times for appointments you may be dismissed from the clinic at the providers discretion.     Again, thank you for choosing [facility].  Our hope is that these requests will decrease the amount of time that you wait before being seen by our physicians.       _____________________________________________________________  Should you have questions after your visit to [facility], please contact our office at [phone] and follow the prompts.  Our office hours are 8:00 a.m. and 4:30 p.m. Monday - Friday.  Please note that voicemails left after 4:00 p.m. may not be returned until the following business day.  We are closed weekends and major holidays.  You do have access to a nurse 24-7, just call the main number to the clinic [phone] and do not press any options, hold on the line and a nurse will answer the phone.    For prescription refill requests, have your pharmacy contact our office and allow 72 hours.    Due to Covid, you will need to wear a mask upon entering the hospital. If you do not have a mask, a mask will be given to you at the Main Entrance upon arrival. For doctor visits, patients  may have 1 support person age 78 or older with them. For treatment visits, patients can not have anyone with them due to social distancing guidelines and our immunocompromised population.

## 2020-12-09 NOTE — Progress Notes (Signed)
[facility] Note  12/10/2020     CHIEF COMPLAINT Patient presents for Retina Follow Up   HISTORY OF PRESENT ILLNESS: Gabrielle [name] is a 75 y.o. female who presents to the clinic today for:   HPI    Retina Follow Up    Patient presents with  Wet AMD.  In both eyes.  This started months ago.  Severity is moderate.  Duration of 4 weeks.  Since onset it is stable.  I, the attending physician,  performed the HPI with the patient and updated documentation appropriately.          Comments    75 y/o female pt here for 4 wk f/u for exu ARMD OU.  No change in [place] OU noticed.  Denies pain, FOL, floaters.  Cosopt QD OU.       Last edited by Bernarda Caffey, MD on 12/10/2020 10:35 AM. (History)    pt states    Referring physician: Janora Norlander, DO [place],  [place] [address]  HISTORICAL INFORMATION:   Selected notes from the MEDICAL RECORD NUMBER Referred by Dr. Cristela Blue for concern of ARMD OU;  LEE- 01.03.19 Cristela Blue) [BCVA OD: 10/50 OS: 20/80] Ocular Hx- ARMD OU, DME PMH- elevated chol., HTN    CURRENT MEDICATIONS: Current Outpatient Medications (Ophthalmic Drugs)  Medication Sig  . dorzolamide-timolol (COSOPT) 22.3-6.8 MG/ML ophthalmic solution PLACE 1 DROP INTO BOTH EYES 2 TIMES A DAY   No current facility-administered medications for this visit. (Ophthalmic Drugs)   Current Outpatient Medications (Other)  Medication Sig  . alendronate (FOSAMAX) 70 MG tablet Take 1 tablet (70 mg total) by mouth every 7 (seven) days. Take with a full glass of water on an empty stomach.  Marland Kitchen amLODipine (NORVASC) 5 MG tablet Take 1 tablet (5 mg total) by mouth daily.  Marland Kitchen atorvastatin (LIPITOR) 20 MG tablet Take 1 tablet (20 mg total) by mouth daily.  . cholecalciferol (VITAMIN D3) 25 MCG (1000 UT) tablet Take 1,000 Units by mouth daily.  Marland Kitchen CRANBERRY FRUIT PO Take 1 tablet by mouth daily.  . diclofenac Sodium (VOLTAREN) 1 % GEL Apply 2 g topically 4  (four) times daily.   Marland Kitchen ELIQUIS 5 MG TABS tablet Take 5 mg by mouth 2 (two) times daily.  . FEROSUL 325 (65 Fe) MG tablet TAKE  (1)  TABLET TWICE A DAY WITH MEALS (BREAKFAST AND SUPPER)  . Garlic 786 MG TABS Take 100 mg by mouth daily.  Marland Kitchen LEUCOVORIN CALCIUM IV Inject into the vein every 14 (fourteen) days.   Marland Kitchen lidocaine-prilocaine (EMLA) cream Apply a small amount to port a cath site and cover with plastic wrap 1 hour prior to chemotherapy appointments  . losartan-hydrochlorothiazide (HYZAAR) 100-25 MG tablet Take 1 tablet by mouth daily.  . OXALIPLATIN IV Inject into the vein every 14 (fourteen) days.   . potassium chloride SA (KLOR-CON) 20 MEQ tablet Take 1 tablet (20 mEq total) by mouth daily.  . prochlorperazine (COMPAZINE) 10 MG tablet Take 1 tablet (10 mg total) by mouth every 6 (six) hours as needed for nausea or vomiting.  . saccharomyces boulardii (FLORASTOR) 250 MG capsule Take by mouth.    No current facility-administered medications for this visit. (Other)      REVIEW OF SYSTEMS: ROS    Positive for: Genitourinary, Musculoskeletal, Eyes   Negative for: Constitutional, Gastrointestinal, Neurological, Skin, HENT, Endocrine, Cardiovascular, Respiratory, Psychiatric, Allergic/Imm, Heme/Lymph   Last edited by Estill Bakes  G, COA on 12/10/2020  9:08 AM. (History)       ALLERGIES Allergies  Allergen Reactions  . Lisinopril Cough    PAST MEDICAL HISTORY Past Medical History:  Diagnosis Date  . Arthritis   . Cancer ([place])   . Cataract    OU  . Colon cancer ([place])   . Family history of pancreatic cancer 08/21/2020  . Family history of uterine cancer 08/21/2020  . H/O cesarean section   . Hx of tonsillectomy   . Hyperlipidemia   . Hypertension   . Macular degeneration    Exu ARMD OU  . Paroxysmal atrial fibrillation ([place]) 07/05/2020   Past Surgical History:  Procedure Laterality Date  . BIOPSY  04/06/2020   Procedure: BIOPSY;  Surgeon: Rogene Houston, MD;  [facility]  [facility];  Service: Endoscopy;;  . CARPAL TUNNEL RELEASE Right   . CATARACT EXTRACTION W/PHACO Left 10/11/2019   Procedure: CATARACT EXTRACTION PHACO AND INTRAOCULAR LENS PLACEMENT (IOC);  Surgeon: Baruch Goldmann, MD;  Location: [facility] ORS;  Service: Ophthalmology;  Laterality: Left;  CDE: 8.56  . CATARACT EXTRACTION W/PHACO Right 10/25/2019   Procedure: CATARACT EXTRACTION PHACO AND INTRAOCULAR LENS PLACEMENT (IOC);  Surgeon: Baruch Goldmann, MD;  Location: [facility] ORS;  Service: Ophthalmology;  Laterality: Right;  CDE: 5.67  . CESAREAN SECTION    . COLONOSCOPY N/A 04/06/2020   Procedure: COLONOSCOPY;  Surgeon: Rogene Houston, MD;  Location: [facility] ENDO SUITE;  Service: Endoscopy;  Laterality: N/A;  . CYSTOSCOPY WITH BIOPSY N/A 04/08/2020   Procedure: CYSTOSCOPY WITH BIOPSY;  Surgeon: Cleon Gustin, MD;  Location: [facility] ORS;  Service: Urology;  Laterality: N/A;  . ESOPHAGOGASTRODUODENOSCOPY N/A 04/05/2020   Procedure: ESOPHAGOGASTRODUODENOSCOPY (EGD);  Surgeon: Rogene Houston, MD;  Location: [facility];  Service: Endoscopy;  Laterality: N/A;  . PARTIAL COLECTOMY N/A 04/08/2020   Procedure: PARTIAL COLECTOMY;  Surgeon: Virl Cagey, MD;  Location: [facility] ORS;  Service: General;  Laterality: N/A;  . PORTACATH PLACEMENT Left 05/18/2020   Procedure: INSERTION PORT-A-CATH (ATTACHED CATHETER IN LEFT SUBCLAVIAN);  Surgeon: Virl Cagey, MD;  Location: [facility] ORS;  Service: General;  Laterality: Left;  . TONSILLECTOMY      FAMILY HISTORY Family History  Problem Relation Age of Onset  . Macular degeneration Mother   . Stroke Mother   . Hypertension Mother   . Dementia Mother   . Lung cancer Father        dx late 55s; smoking hx  . Diabetes Sister   . Hypertension Sister   . Leukemia Paternal Uncle        d. 37s  . Cancer Maternal Aunt        ovarian or endometrial dx 28s  . Pancreatic cancer Paternal Uncle        d. late 80s    SOCIAL HISTORY Social History   Tobacco Use  . Smoking  status: Never Smoker  . Smokeless tobacco: Never Used  Vaping Use  . Vaping Use: Never used  Substance Use Topics  . Alcohol use: No  . Drug use: No         OPHTHALMIC EXAM:  Base Eye Exam    Visual Acuity (Snellen - Linear)      Right Left   Dist cc CF @ 3' 20/80 -2   Dist ph cc NI NI   Correction: Glasses       Tonometry (Tonopen, 9:10 AM)      Right Left   Pressure 23 17  Pupils      Dark Light Shape React APD   Right 4 3 Round Brisk None   Left 4 3 Round Brisk None       Visual Fields (Counting fingers)      Left Right    Full Full       Extraocular Movement      Right Left    Full, Ortho Full, Ortho       Neuro/Psych    Oriented x3: Yes   Mood/Affect: Normal       Dilation    Both eyes: 1.0% Mydriacyl, 2.5% Phenylephrine @ 9:10 AM        Slit Lamp and Fundus Exam    Slit Lamp Exam      Right Left   Lids/Lashes Dermatochalasis - upper lid Dermatochalasis - upper lid   Conjunctiva/Sclera White and quiet White and quiet   Cornea Arcus, 1-2+ Punctate epithelial erosions Arcus, 1-2+ Punctate epithelial erosions   Anterior Chamber Deep and quiet Deep and quiet   Iris Round and well dilated Round and well dilated   Lens PCIOL in good position PCIOL in good position   Vitreous Vitreous syneresis, Posterior vitreous detachment, vitreous condensations Vitreous syneresis, Posterior vitreous detachment       Fundus Exam      Right Left   Disc pink and sharp, compact pink and sharp, Compact, vascular loops superiorly   C/D Ratio 0.2 0.2   Macula Blunted foveal reflex, Drusen, Pigment clumping and atrophy, central thickening/disciform scar, +PED, no heme, persistent cystic changes overlying central scar Blunted foveal reflex, central thickening, RPE clumping and atrophy, Drusen, RPE rip, mild IRF/SRF overlying PED -- slightly improved, focal hemorrhage temporal side of central thickening/fibrosis -- improved   Vessels Vascular attenuation, Tortuous  Vascular attenuation, Tortuous   Periphery Attached, mild Reticular degeneration Attached, mild Reticular degeneration          IMAGING AND PROCEDURES  Imaging and Procedures for 03/21/18  OCT, Retina - OU - Both Eyes       Right Eye Quality was good. Central Foveal Thickness: 479. Progression has been stable. Findings include pigment epithelial detachment, epiretinal membrane, subretinal hyper-reflective material, retinal drusen , abnormal foveal contour, no SRF, intraretinal fluid, disciform scar, outer retinal tubulation, outer retinal atrophy (Stable sub-retinal scar, mild persistent cystic changes overlying).   Left Eye Quality was good. Central Foveal Thickness: 300. Progression has been stable. Findings include subretinal hyper-reflective material, pigment epithelial detachment, intraretinal fluid, retinal drusen , abnormal foveal contour, outer retinal atrophy, subretinal fluid (stable improvement in central [facility] and overlying IRF/cystic changes).   Notes *Images captured and stored on drive  Diagnosis / Impression:  Exudative ARMD OU OD: Stable sub-retinal scar, mild persistent cystic changes overlying OS: stable improvement in central SRHM and overlying IRF/cystic changes  Clinical management:  See below  Abbreviations: NFP - Normal foveal profile. CME - cystoid macular edema. PED - pigment epithelial detachment. IRF - intraretinal fluid. SRF - subretinal fluid. EZ - ellipsoid zone. ERM - epiretinal membrane. ORA - outer retinal atrophy. ORT - outer retinal tubulation. SRHM - subretinal hyper-reflective material        Intravitreal Injection, Pharmacologic Agent - OS - Left Eye       Time Out 12/10/2020. 9:53 AM. Confirmed correct patient, procedure, site, and patient consented.   Anesthesia Topical anesthesia was used. Anesthetic medications included Lidocaine 2%, Proparacaine 0.5%.   Procedure Preparation included 5% betadine to ocular surface, eyelid  speculum. A (33g) needle  was used.   Injection:  2 mg aflibercept Alfonse Flavors) SOLN   NDC: [id number], Lot: [id number], Expiration date: 03/05/2021   Route: Intravitreal, Site: Left Eye, Waste: 0.05 mL  Post-op Post injection exam found visual acuity of at least counting fingers. The patient tolerated the procedure well. There were no complications. The patient received written and verbal post procedure care education. Post injection medications were not given.                 ASSESSMENT/PLAN:    ICD-10-CM   1. Exudative age-related macular degeneration of both eyes with active choroidal neovascularization (HCC)  H35.3231 Intravitreal Injection, Pharmacologic Agent - OS - Left Eye    aflibercept (EYLEA) SOLN 2 mg  2. Retinal edema  H35.81 OCT, Retina - OU - Both Eyes  3. Posterior vitreous detachment of both eyes  H43.813   4. Pseudophakia of both eyes  Z96.1   5. Ocular hypertension, bilateral  H40.053     1,2. Exudative age related macular degeneration, both eyes.    - severe exudative disease with very active CNVM OU at presentation in January 2019  - S/P IVA OD #1 (01.04.19), #2 (02.15.19)  - S/P IVA OS #1 (01.18.19), #2 (02.15.19)  - switched to Eylea 3.18.19 due to severity of disease  - S/P IVE OD #1 (03.18.19), #2 (04.17.19), #3 (05.15.19) -- injections held due to stable disciform scar, #4 (10.02.19),  #5 (01.15.20), #6 (06.04.20), #7 (10.29.20), #8 (01.21.21), #9 (05.27.21), #10 (07.22.21)  - S/P IVE OS #1 (03.18.19), #2 (04.17.19), #3 (05.15.19), #4 (06.12.19), #5 (07.10.19), #6 (08.07.19), #7 (09.04.19), #8 (10.02.19), #9 (11.06.19), #10 (12.11.19), #11 (01.15.20), #12 (02.20.20), #13 (03.26.20), #14 (04.30.20), #15 (06.04.20), #16 (07.16.20), # 17 (08.20.20), #18 (09.24.20), #19 (10.29.20), #20 (12.03.20), #21 (01.21.21), #22 (03.18.21), #23 (05.27.21), #24 (07.22.21), #25 (11.11.21), #26 (12.09.21)  - IVE held from 7.22.21 to 11.11.21 due to TIA/stroke on 8.1.21  - OCT  OS today stable improvement in central SRHM and overlying IRF/cystic changes  - exam OS shows macular heme improving  - OD with significant subretinal disciform scar - mild persistent cystic changes overlying IRHM  - BCVA OD stable at CF 3'; OS 20/80 - improved from 20/150  - recommend IVE OS #27 today, 01.06.22 -- will hold OD  - Eylea informed consent form signed and scanned on 01.21.2021  - Eylea4U paperwork filled out on 02.15.19 and fully approved through [employer] -- approved  - f/u in 5 weeks, sooner prn for DFE/OCT/possible injection(s)  3. PVD / vitreous syneresis  - Discussed findings and prognosis  - No RT or RD on 360  exam  - Reviewed s/s of RT/RD  - Strict return precautions for any such RT/RD signs/symptoms  4. Pseudophakia OU  - s/p CE/IOL (Dr. Marisa Hua, 11.2020)  - beautiful surgeries w/ IOLs in excellent position, doing well  - monitor  5. Ocular Hypertension OU  - IOP okay today -- 23 OD, 17 OS  - cont Cosopt qd OU -- if remains elevated may increase to BID   Ophthalmic Meds Ordered this visit:  Meds ordered this encounter  Medications  . aflibercept (EYLEA) SOLN 2 mg       Return in about 5 weeks (around 01/14/2021) for f/u exu ARMD OU, DFE, OCT.  There are no Patient Instructions on file for this visit.  This document serves as a record of services personally performed by Gardiner Sleeper, MD, PhD. It was created on their behalf by Leonie Douglas, an  ophthalmic technician. The creation of this record is the provider's dictation and/or activities during the visit.    Electronically signed by: Leonie Douglas COA, 12/10/20  10:42 AM  This document serves as a record of services personally performed by Gardiner Sleeper, MD, PhD. It was created on their behalf by San Jetty. Owens Shark, OA an ophthalmic technician. The creation of this record is the provider's dictation and/or activities during the visit.    Electronically signed by: San Jetty. Owens Shark, [place] 01.06.2022 10:42  AM   Gardiner Sleeper, M.D., Ph.D. Diseases & Surgery of the Retina and Vitreous [facility]  I have reviewed the above documentation for accuracy and completeness, and I agree with the above. Gardiner Sleeper, M.D., Ph.D. 12/10/20 10:44 AM   Abbreviations: M myopia (nearsighted); A astigmatism; H hyperopia (farsighted); P presbyopia; Mrx spectacle prescription;  CTL contact lenses; OD right eye; OS left eye; OU both eyes  XT exotropia; ET esotropia; PEK punctate epithelial keratitis; PEE punctate epithelial erosions; DES dry eye syndrome; MGD meibomian gland dysfunction; ATs artificial tears; PFAT's preservative free artificial tears; [place] nuclear sclerotic cataract; PSC posterior subcapsular cataract; ERM epi-retinal membrane; PVD posterior vitreous detachment; RD retinal detachment; DM diabetes mellitus; DR diabetic retinopathy; NPDR non-proliferative diabetic retinopathy; PDR proliferative diabetic retinopathy; CSME clinically significant macular edema; DME diabetic macular edema; dbh dot blot hemorrhages; CWS cotton wool spot; POAG primary open angle glaucoma; C/D cup-to-disc ratio; HVF humphrey visual field; GVF goldmann visual field; OCT optical coherence tomography; IOP intraocular pressure; BRVO Branch retinal vein occlusion; CRVO central retinal vein occlusion; CRAO central retinal artery occlusion; BRAO branch retinal artery occlusion; RT retinal tear; SB scleral buckle; PPV pars plana vitrectomy; VH Vitreous hemorrhage; PRP panretinal laser photocoagulation; IVK intravitreal kenalog; VMT vitreomacular traction; MH Macular hole;  NVD neovascularization of the disc; NVE neovascularization elsewhere; AREDS age related eye disease study; ARMD age related macular degeneration; POAG primary open angle glaucoma; EBMD epithelial/anterior basement membrane dystrophy; ACIOL anterior chamber intraocular lens; IOL intraocular lens; PCIOL posterior chamber intraocular lens; Phaco/IOL  phacoemulsification with intraocular lens placement; [place] photorefractive keratectomy; LASIK laser assisted in situ keratomileusis; HTN hypertension; DM diabetes mellitus; COPD chronic obstructive pulmonary disease

## 2020-12-10 ENCOUNTER — Encounter (INDEPENDENT_AMBULATORY_CARE_PROVIDER_SITE_OTHER): Payer: Self-pay | Admitting: Ophthalmology

## 2020-12-10 ENCOUNTER — Other Ambulatory Visit: Payer: Self-pay

## 2020-12-10 ENCOUNTER — Ambulatory Visit (INDEPENDENT_AMBULATORY_CARE_PROVIDER_SITE_OTHER): Payer: Medicare HMO | Admitting: Ophthalmology

## 2020-12-10 DIAGNOSIS — H353231 Exudative age-related macular degeneration, bilateral, with active choroidal neovascularization: Secondary | ICD-10-CM | POA: Diagnosis not present

## 2020-12-10 DIAGNOSIS — H43813 Vitreous degeneration, bilateral: Secondary | ICD-10-CM | POA: Diagnosis not present

## 2020-12-10 DIAGNOSIS — H3581 Retinal edema: Secondary | ICD-10-CM

## 2020-12-10 DIAGNOSIS — Z961 Presence of intraocular lens: Secondary | ICD-10-CM | POA: Diagnosis not present

## 2020-12-10 DIAGNOSIS — H40053 Ocular hypertension, bilateral: Secondary | ICD-10-CM

## 2020-12-10 MED ORDER — AFLIBERCEPT 2MG/0.05ML IZ SOLN FOR KALEIDOSCOPE
2.0000 mg | INTRAVITREAL | Status: AC | PRN
  Administered 2020-12-10: 2 mg via INTRAVITREAL

## 2020-12-21 DIAGNOSIS — Z85038 Personal history of other malignant neoplasm of large intestine: Secondary | ICD-10-CM | POA: Diagnosis not present

## 2020-12-21 DIAGNOSIS — M48061 Spinal stenosis, lumbar region without neurogenic claudication: Secondary | ICD-10-CM | POA: Diagnosis not present

## 2020-12-21 DIAGNOSIS — M6281 Muscle weakness (generalized): Secondary | ICD-10-CM | POA: Diagnosis not present

## 2020-12-21 DIAGNOSIS — I69898 Other sequelae of other cerebrovascular disease: Secondary | ICD-10-CM | POA: Diagnosis not present

## 2020-12-23 ENCOUNTER — Inpatient Hospital Stay (HOSPITAL_COMMUNITY): Payer: Medicare HMO

## 2020-12-23 ENCOUNTER — Inpatient Hospital Stay (HOSPITAL_COMMUNITY): Payer: Medicare HMO | Attending: Hematology | Admitting: Hematology

## 2020-12-23 ENCOUNTER — Other Ambulatory Visit: Payer: Self-pay

## 2020-12-23 VITALS — BP 138/62 | HR 68 | Temp 97.1°F | Resp 18

## 2020-12-23 VITALS — BP 154/62 | HR 78 | Temp 97.5°F | Resp 18 | Wt 147.4 lb

## 2020-12-23 DIAGNOSIS — Z452 Encounter for adjustment and management of vascular access device: Secondary | ICD-10-CM | POA: Diagnosis not present

## 2020-12-23 DIAGNOSIS — Z5112 Encounter for antineoplastic immunotherapy: Secondary | ICD-10-CM | POA: Insufficient documentation

## 2020-12-23 DIAGNOSIS — C182 Malignant neoplasm of ascending colon: Secondary | ICD-10-CM | POA: Insufficient documentation

## 2020-12-23 DIAGNOSIS — C189 Malignant neoplasm of colon, unspecified: Secondary | ICD-10-CM

## 2020-12-23 LAB — CBC WITH DIFFERENTIAL/PLATELET
Abs Immature Granulocytes: 0.01 10*3/uL (ref 0.00–0.07)
Basophils Absolute: 0 10*3/uL (ref 0.0–0.1)
Basophils Relative: 0 %
Eosinophils Absolute: 0.3 10*3/uL (ref 0.0–0.5)
Eosinophils Relative: 6 %
HCT: 37.5 % (ref 36.0–46.0)
Hemoglobin: 12.2 g/dL (ref 12.0–15.0)
Immature Granulocytes: 0 %
Lymphocytes Relative: 24 %
Lymphs Abs: 1.3 10*3/uL (ref 0.7–4.0)
MCH: 30.6 pg (ref 26.0–34.0)
MCHC: 32.5 g/dL (ref 30.0–36.0)
MCV: 94 fL (ref 80.0–100.0)
Monocytes Absolute: 0.4 10*3/uL (ref 0.1–1.0)
Monocytes Relative: 8 %
Neutro Abs: 3.2 10*3/uL (ref 1.7–7.7)
Neutrophils Relative %: 62 %
Platelets: 127 10*3/uL — ABNORMAL LOW (ref 150–400)
RBC: 3.99 MIL/uL (ref 3.87–5.11)
RDW: 13.3 % (ref 11.5–15.5)
WBC: 5.3 10*3/uL (ref 4.0–10.5)
nRBC: 0 % (ref 0.0–0.2)

## 2020-12-23 LAB — COMPREHENSIVE METABOLIC PANEL WITH GFR
ALT: 15 U/L (ref 0–44)
AST: 20 U/L (ref 15–41)
Albumin: 4.1 g/dL (ref 3.5–5.0)
Alkaline Phosphatase: 58 U/L (ref 38–126)
Anion gap: 11 (ref 5–15)
BUN: 31 mg/dL — ABNORMAL HIGH (ref 8–23)
CO2: 28 mmol/L (ref 22–32)
Calcium: 9.7 mg/dL (ref 8.9–10.3)
Chloride: 98 mmol/L (ref 98–111)
Creatinine, Ser: 0.97 mg/dL (ref 0.44–1.00)
GFR, Estimated: >60 mL/min
Glucose, Bld: 92 mg/dL (ref 70–99)
Potassium: 3.3 mmol/L — ABNORMAL LOW (ref 3.5–5.1)
Sodium: 137 mmol/L (ref 135–145)
Total Bilirubin: 0.4 mg/dL (ref 0.3–1.2)
Total Protein: 7.5 g/dL (ref 6.5–8.1)

## 2020-12-23 LAB — MAGNESIUM: Magnesium: 2.1 mg/dL (ref 1.7–2.4)

## 2020-12-23 MED ORDER — SODIUM CHLORIDE 0.9 % IV SOLN
200.0000 mg | Freq: Once | INTRAVENOUS | Status: AC
  Administered 2020-12-23: 200 mg via INTRAVENOUS
  Filled 2020-12-23: qty 8

## 2020-12-23 MED ORDER — ALTEPLASE 2 MG IJ SOLR
2.0000 mg | Freq: Once | INTRAMUSCULAR | Status: AC
  Administered 2020-12-23: 2 mg

## 2020-12-23 MED ORDER — STERILE WATER FOR INJECTION IJ SOLN
INTRAMUSCULAR | Status: AC
  Filled 2020-12-23: qty 10

## 2020-12-23 MED ORDER — ALTEPLASE 2 MG IJ SOLR
INTRAMUSCULAR | Status: AC
  Filled 2020-12-23: qty 2

## 2020-12-23 MED ORDER — SODIUM CHLORIDE 0.9 % IV SOLN: Freq: Once | INTRAVENOUS | Status: AC

## 2020-12-23 MED ORDER — HEPARIN SOD (PORK) LOCK FLUSH 100 UNIT/ML IV SOLN
500.0000 [IU] | Freq: Once | INTRAVENOUS | Status: AC | PRN
Start: 2020-12-23 — End: 2020-12-23
  Administered 2020-12-23: 500 [IU]

## 2020-12-23 MED ORDER — SODIUM CHLORIDE 0.9% FLUSH
10.0000 mL | INTRAVENOUS | Status: DC | PRN
  Administered 2020-12-23: 10 mL

## 2020-12-23 NOTE — Progress Notes (Unsigned)
Gabrielle [name] presented for Portacath access and lab draw.  Portacath located left chest wall accessed with  H 20 needle.  No blood return; see IV flowsheet for details. Portacath flushed with 86ml NS and 500U/20ml Procedure tolerated well and without incident.    1305 - Alteplase 2 mg given IVP via port.

## 2020-12-23 NOTE — Progress Notes (Signed)
Patient presents today for Keytruda infusion.  Vital signs within parameters for treatment.  Labs pending.  Patient has no new complaints since last visit.  1335, aspirated 1.5 cc of alteplase,  No blood return.  Pushed aspirated alteplase back into PORT.  1405, aspirated 1 cc of alteplase,  No blood return.  Discarded alteplase.  Flushed PORT with 10 cc of NS, no complaints of pain.  No signs of infiltration.    Labs resulted and with in parameters for treatment.  Treatment given today per MD orders.  Tolerated infusion without adverse affects.  Vital signs stable.  No complaints at this time.  Discharge from clinic ambulatory in stable condition.  Alert and oriented X 3.  Follow up with [facility] as scheduled.

## 2020-12-23 NOTE — Patient Instructions (Signed)
[facility] Discharge Instructions for Patients Receiving Chemotherapy  Today you received the following chemotherapy agents   To help prevent nausea and vomiting after your treatment, we encourage you to take your nausea medication   If you develop nausea and vomiting that is not controlled by your nausea medication, call the clinic.   BELOW ARE SYMPTOMS THAT SHOULD BE REPORTED IMMEDIATELY:  *FEVER GREATER THAN 100.5 F  *CHILLS WITH OR WITHOUT FEVER  NAUSEA AND VOMITING THAT IS NOT CONTROLLED WITH YOUR NAUSEA MEDICATION  *UNUSUAL SHORTNESS OF BREATH  *UNUSUAL BRUISING OR BLEEDING  TENDERNESS IN MOUTH AND THROAT WITH OR WITHOUT PRESENCE OF ULCERS  *URINARY PROBLEMS  *BOWEL PROBLEMS  UNUSUAL RASH Items with * indicate a potential emergency and should be followed up as soon as possible.  Feel free to call the clinic should you have any questions or concerns. The clinic phone number is [phone].  Please show the CHEMO ALERT CARD at check-in to the Emergency Department and triage nurse.   

## 2020-12-23 NOTE — Patient Instructions (Addendum)
[facility] at [facility] Discharge Instructions  You were seen today by Dr. Katragadda. He went over your recent results. You received your treatment today. Dr. Katragadda will see you back in 3 weeks for labs and follow up.   Thank you for choosing [facility] at [facility] to provide your oncology and hematology care.  To afford each patient quality time with our provider, please arrive at least 15 minutes before your scheduled appointment time.   If you have a lab appointment with the Cancer Center please come in thru the Main Entrance and check in at the main information desk  You need to re-schedule your appointment should you arrive 10 or more minutes late.  We strive to give you quality time with our providers, and arriving late affects you and other patients whose appointments are after yours.  Also, if you no show three or more times for appointments you may be dismissed from the clinic at the providers discretion.     Again, thank you for choosing [facility].  Our hope is that these requests will decrease the amount of time that you wait before being seen by our physicians.       _____________________________________________________________  Should you have questions after your visit to [facility], please contact our office at [phone] between the hours of 8:00 a.m. and 4:30 p.m.  Voicemails left after 4:00 p.m. will not be returned until the following business day.  For prescription refill requests, have your pharmacy contact our office and allow 72 hours.    Cancer Center Support Programs:   > Cancer Support Group  2nd Tuesday of the month 1pm-2pm, Journey Room    

## 2020-12-23 NOTE — Progress Notes (Signed)
[place], [place] [address]   CLINIC:  Medical Oncology/Hematology  PCP:  Janora Norlander, DO [address] [place] [address] [phone]   REASON FOR VISIT:  Follow-up for right colon cancer  PRIOR THERAPY:  1. Right hemicolectomy on 04/08/2020. 2. FOLFOX x 3 cycles from 05/19/2020 to 07/14/2020.  NGS Results: Foundation 1 KRAS/NRAS wild-type, MSI--high, TMB 55 Muts/Mb, BRAF V 600 E  CURRENT THERAPY: Keytruda every 3 weeks  BRIEF ONCOLOGIC HISTORY:  Oncology History  Malignant neoplasm of colon ([facility])  04/07/2020 Initial Diagnosis   Malignant neoplasm of ascending colon ([facility])   05/13/2020 Genetic Testing   Foundation One     05/19/2020 - 07/02/2020 Chemotherapy   The patient had palonosetron (ALOXI) injection 0.25 mg, 0.25 mg, Intravenous,  Once, 2 of 12 cycles Administration: 0.25 mg (05/19/2020), 0.25 mg (06/30/2020) leucovorin 522 mg in dextrose 5 % 250 mL infusion, 320 mg/m2 = 522 mg (80 % of original dose 400 mg/m2), Intravenous,  Once, 2 of 12 cycles Dose modification: 320 mg/m2 (80 % of original dose 400 mg/m2, Cycle 1, Reason: Provider Judgment), 250.0370 mg/m2 (66.7 % of original dose 400 mg/m2, Cycle 2, Reason: Provider Judgment) Administration: 522 mg (05/19/2020), 434 mg (06/30/2020) oxaliplatin (ELOXATIN) 110 mg in dextrose 5 % 500 mL chemo infusion, 68 mg/m2 = 110 mg (80 % of original dose 85 mg/m2), Intravenous,  Once, 1 of 1 cycle Dose modification: 68 mg/m2 (80 % of original dose 85 mg/m2, Cycle 1, Reason: Provider Judgment) Administration: 110 mg (05/19/2020) fluorouracil (ADRUCIL) chemo injection 500 mg, 320 mg/m2 = 500 mg (80 % of original dose 400 mg/m2), Intravenous,  Once, 2 of 12 cycles Dose modification: 320 mg/m2 (80 % of original dose 400 mg/m2, Cycle 1, Reason: Provider Judgment), 488.8916 mg/m2 (66.7 % of original dose 400 mg/m2, Cycle 2, Reason: Provider Judgment) Administration: 500 mg (05/19/2020), 450  mg (06/30/2020) fluorouracil (ADRUCIL) 3,150 mg in sodium chloride 0.9 % 87 mL chemo infusion, 1,920 mg/m2 = 3,150 mg (80 % of original dose 2,400 mg/m2), Intravenous, 1 Day/Dose, 2 of 12 cycles Dose modification: 1,920 mg/m2 (80 % of original dose 2,400 mg/m2, Cycle 1, Reason: Provider Judgment), 1,600 mg/m2 (66.7 % of original dose 2,400 mg/m2, Cycle 2, Reason: Provider Judgment) Administration: 3,150 mg (05/19/2020), 2,600 mg (06/30/2020)  for chemotherapy treatment.    08/03/2020 Genetic Testing   Guardant Reveal Testing     08/14/2020 Genetic Testing   No pathogenic variants detected in [facility] Common Hereditary Cancers Panel.  Variant of uncertain significance (VUS) detected in HOXB13 at c.634G>A (p.Ala212Thr). The Common Hereditary Cancers Panel offered by [facility] includes sequencing and/or deletion duplication testing of the following 48 genes: APC, ATM, AXIN2, BARD1, BMPR1A, BRCA1, BRCA2, BRIP1, CDH1, CDK4, CDKN2A (p14ARF), CDKN2A (p16INK4a), CHEK2, CTNNA1, DICER1, EPCAM (Deletion/duplication testing only), GREM1 (promoter region deletion/duplication testing only), KIT, MEN1, MLH1, MSH2, MSH3, MSH6, MUTYH, NBN, NF1, NHTL1, PALB2, PDGFRA, PMS2, POLD1, POLE, PTEN, RAD50, RAD51C, RAD51D, RNF43, SDHB, SDHC, SDHD, SMAD4, SMARCA4. STK11, TP53, TSC1, TSC2, and VHL.  The following genes were evaluated for sequence changes only: SDHA and HOXB13 c.251G>A variant only. The report date is August 14, 2020.    11/05/2020 Cancer Staging   Staging form: Colon and Rectum, AJCC 8th Edition - Pathologic stage from 11/05/2020: Stage IVC (rpTX, pN0, pM1c) - Signed by Derek Jack, MD on 11/05/2020   11/10/2020 -  Chemotherapy    Patient is on Treatment Plan: COLORECTAL PEMBROLIZUMAB Q21D  CANCER STAGING: Cancer Staging Malignant neoplasm of colon [facility]) Staging form: Colon and Rectum, AJCC 8th Edition - Clinical stage from 04/27/2020: Stage IIIB (cT4a, cN1a, cM0) - Unsigned - Pathologic stage  from 11/05/2020: Stage IVC (rpTX, pN0, pM1c) - Signed by Doreatha Massed, MD on 11/05/2020   INTERVAL HISTORY:  Gabrielle [name], a 75 y.o. female, returns for routine follow-up and consideration for next cycle of immunotherapy. Gabrielle [name] was last seen on 12/01/2020.  Due for cycle #3 of Keytruda today.   Today she is accompanied by her neighbor. Overall, she tells me she has been feeling pretty well. Her appetite is excellent and she is walking multiple times per day. She denies having any abdominal pain, N/V or new cough; she reports having 2 regular BM's daily. She denies having any leg swelling, skin rashes or itching.  Overall, she feels ready for next cycle of immunotherapy today.    REVIEW OF SYSTEMS:  Review of Systems  Constitutional: Negative for appetite change and fatigue.  Respiratory: Negative for cough.   Cardiovascular: Negative for leg swelling.  Gastrointestinal: Negative for abdominal pain, constipation, diarrhea, nausea and vomiting.  Skin: Negative for itching and rash.  All other systems reviewed and are negative.   PAST MEDICAL/SURGICAL HISTORY:  Past Medical History:  Diagnosis Date  . Arthritis   . Cancer (HCC)   . Cataract    OU  . Colon cancer (HCC)   . Family history of pancreatic cancer 08/21/2020  . Family history of uterine cancer 08/21/2020  . H/O cesarean section   . Hx of tonsillectomy   . Hyperlipidemia   . Hypertension   . Macular degeneration    Exu ARMD OU  . Paroxysmal atrial fibrillation (HCC) 07/05/2020   Past Surgical History:  Procedure Laterality Date  . BIOPSY  04/06/2020   Procedure: BIOPSY;  Surgeon: Malissa Hippo, MD;  [facility];  Service: Endoscopy;;  . CARPAL TUNNEL RELEASE Right   . CATARACT EXTRACTION W/PHACO Left 10/11/2019   Procedure: CATARACT EXTRACTION PHACO AND INTRAOCULAR LENS PLACEMENT (IOC);  Surgeon: Fabio Pierce, MD;  Location: [facility] ORS;  Service: Ophthalmology;  Laterality: Left;  CDE: 8.56   . CATARACT EXTRACTION W/PHACO Right 10/25/2019   Procedure: CATARACT EXTRACTION PHACO AND INTRAOCULAR LENS PLACEMENT (IOC);  Surgeon: Fabio Pierce, MD;  Location: [facility];  Service: Ophthalmology;  Laterality: Right;  CDE: 5.67  . CESAREAN SECTION    . COLONOSCOPY N/A 04/06/2020   Procedure: COLONOSCOPY;  Surgeon: Malissa Hippo, MD;  Location: [facility];  Service: Endoscopy;  Laterality: N/A;  . CYSTOSCOPY WITH BIOPSY N/A 04/08/2020   Procedure: CYSTOSCOPY WITH BIOPSY;  Surgeon: Malen Gauze, MD;  Location: [facility];  Service: Urology;  Laterality: N/A;  . ESOPHAGOGASTRODUODENOSCOPY N/A 04/05/2020   Procedure: ESOPHAGOGASTRODUODENOSCOPY (EGD);  Surgeon: Malissa Hippo, MD;  [facility];  Service: Endoscopy;  Laterality: N/A;  . PARTIAL COLECTOMY N/A 04/08/2020   Procedure: PARTIAL COLECTOMY;  Surgeon: Lucretia Roers, MD;  Location: [facility];  Service: General;  Laterality: N/A;  . PORTACATH PLACEMENT Left 05/18/2020   Procedure: INSERTION PORT-A-CATH (ATTACHED CATHETER IN LEFT SUBCLAVIAN);  Surgeon: Lucretia Roers, MD;  Location: [facility];  Service: General;  Laterality: Left;  . TONSILLECTOMY      SOCIAL HISTORY:  Social History   Socioeconomic History  . Marital status: Divorced    Spouse name: Not on file  . Number of children: Not on file  . Years of education:  Not on file  . Highest education level: Not on file  Occupational History  . Not on file  Tobacco Use  . Smoking status: Never Smoker  . Smokeless tobacco: Never Used  Vaping Use  . Vaping Use: Never used  Substance and Sexual Activity  . Alcohol use: No  . Drug use: No  . Sexual activity: Not Currently  Other Topics Concern  . Not on file  Social History Narrative  . Not on file   Social Determinants of Health   Financial Resource Strain: Medium Risk  . Difficulty of Paying Living Expenses: Somewhat hard  Food Insecurity: Food Insecurity Present  . Worried About Charity fundraiser in  the Last Year: Sometimes true  . Ran Out of Food in the Last Year: Never true  Transportation Needs: Unmet Transportation Needs  . Lack of Transportation (Medical): Yes  . Lack of Transportation (Non-Medical): Yes  Physical Activity: Insufficiently Active  . Days of Exercise per Week: 7 days  . Minutes of Exercise per Session: 20 min  Stress: Stress Concern Present  . Feeling of Stress : Very much  Social Connections: Moderately Integrated  . Frequency of Communication with Friends and Family: More than three times a week  . Frequency of Social Gatherings with Friends and Family: Once a week  . Attends Religious Services: More than 4 times per year  . Active Member of Clubs or Organizations: Yes  . Attends Archivist Meetings: Never  . Marital Status: Divorced  Human resources officer Violence: Not At Risk  . Fear of Current or Ex-Partner: No  . Emotionally Abused: No  . Physically Abused: No  . Sexually Abused: No    FAMILY HISTORY:  Family History  Problem Relation Age of Onset  . Macular degeneration Mother   . Stroke Mother   . Hypertension Mother   . Dementia Mother   . Lung cancer Father        dx late 52s; smoking hx  . Diabetes Sister   . Hypertension Sister   . Leukemia Paternal Uncle        d. 9s  . Cancer Maternal Aunt        ovarian or endometrial dx 84s  . Pancreatic cancer Paternal Uncle        d. late 32s    CURRENT MEDICATIONS:  Current Outpatient Medications  Medication Sig Dispense Refill  . alendronate (FOSAMAX) 70 MG tablet Take 1 tablet (70 mg total) by mouth every 7 (seven) days. Take with a full glass of water on an empty stomach. 4 tablet 11  . amLODipine (NORVASC) 5 MG tablet Take 1 tablet (5 mg total) by mouth daily. 90 tablet 3  . atorvastatin (LIPITOR) 20 MG tablet Take 1 tablet (20 mg total) by mouth daily. 90 tablet 3  . cholecalciferol (VITAMIN D3) 25 MCG (1000 UT) tablet Take 1,000 Units by mouth daily.    Marland Kitchen CRANBERRY FRUIT PO Take  1 tablet by mouth daily.    . diclofenac Sodium (VOLTAREN) 1 % GEL Apply 2 g topically 4 (four) times daily.     . dorzolamide-timolol (COSOPT) 22.3-6.8 MG/ML ophthalmic solution PLACE 1 DROP INTO BOTH EYES 2 TIMES A DAY 10 mL 0  . ELIQUIS 5 MG TABS tablet Take 5 mg by mouth 2 (two) times daily.    . FEROSUL 325 (65 Fe) MG tablet TAKE  (1)  TABLET TWICE A DAY WITH MEALS (BREAKFAST AND SUPPER) 180 tablet 0  . Garlic  100 MG TABS Take 100 mg by mouth daily.    Marland Kitchen LEUCOVORIN CALCIUM IV Inject into the vein every 14 (fourteen) days.     Marland Kitchen lidocaine-prilocaine (EMLA) cream Apply a small amount to port a cath site and cover with plastic wrap 1 hour prior to chemotherapy appointments 30 g 0  . losartan-hydrochlorothiazide (HYZAAR) 100-25 MG tablet Take 1 tablet by mouth daily.    . OXALIPLATIN IV Inject into the vein every 14 (fourteen) days.     . potassium chloride SA (KLOR-CON) 20 MEQ tablet Take 1 tablet (20 mEq total) by mouth daily. 90 tablet 1  . prochlorperazine (COMPAZINE) 10 MG tablet Take 1 tablet (10 mg total) by mouth every 6 (six) hours as needed for nausea or vomiting. 30 tablet 0  . saccharomyces boulardii (FLORASTOR) 250 MG capsule Take by mouth.      No current facility-administered medications for this visit.    ALLERGIES:  Allergies  Allergen Reactions  . Lisinopril Cough    PHYSICAL EXAM:  Performance status (ECOG): 1 - Symptomatic but completely ambulatory  Vitals:   12/23/20 1248  BP: (!) 154/62  Pulse: 78  Resp: 18  Temp: (!) 97.5 F (36.4 C)  SpO2: 99%   Wt Readings from Last 3 Encounters:  12/23/20 147 lb 6.4 oz (66.9 kg)  12/01/20 141 lb 5 oz (64.1 kg)  11/10/20 138 lb (62.6 kg)   Physical Exam Vitals reviewed.  Constitutional:      Appearance: Normal appearance.  Cardiovascular:     Rate and Rhythm: Normal rate and regular rhythm.     Pulses: Normal pulses.     Heart sounds: Normal heart sounds.  Pulmonary:     Effort: Pulmonary effort is normal.      Breath sounds: Normal breath sounds.  Chest:     Comments: Port-a-Cath in L chest Musculoskeletal:     Right lower leg: No edema.     Left lower leg: No edema.  Neurological:     General: No focal deficit present.     Mental Status: She is alert and oriented to person, place, and time.  Psychiatric:        Mood and Affect: Mood normal.        Behavior: Behavior normal.     LABORATORY DATA:  I have reviewed the labs as listed.  CBC Latest Ref Rng & Units 12/23/2020 12/01/2020 11/10/2020  WBC 4.0 - 10.5 K/uL 5.3 6.1 5.2  Hemoglobin 12.0 - 15.0 g/dL 12.2 12.2 11.8(L)  Hematocrit 36.0 - 46.0 % 37.5 38.1 37.1  Platelets 150 - 400 K/uL 127(L) 122(L) 131(L)   CMP Latest Ref Rng & Units 12/23/2020 12/01/2020 11/10/2020  Glucose 70 - 99 mg/dL 92 95 75  BUN 8 - 23 mg/dL 31(H) 30(H) 27(H)  Creatinine 0.44 - 1.00 mg/dL 0.97 0.91 1.00  Sodium 135 - 145 mmol/L 137 138 140  Potassium 3.5 - 5.1 mmol/L 3.3(L) 4.2 3.8  Chloride 98 - 111 mmol/L 98 101 101  CO2 22 - 32 mmol/L _0 Calcium 8.9 - 10.3 mg/dL 9.7 9.6 9.7  Total Protein 6.5 - 8.1 g/dL 7.5 7.7 7.8  Total Bilirubin 0.3 - 1.2 mg/dL 0.4 0.5 0.6  Alkaline Phos 38 - 126 U/L 58 65 67  AST 15 - 41 U/L _1 ALT 0 - 44 U/L _2 Lab Results  Component Value Date   CEA1 5.9 (H) 11/05/2020   CEA1 4.5 08/03/2020  CEA1 1.7 05/13/2020    DIAGNOSTIC IMAGING:  I have independently reviewed the scans and discussed with the patient. Intravitreal Injection, Pharmacologic Agent - OS - Left Eye  Result Date: 12/10/2020 Time Out 12/10/2020. 9:53 AM. Confirmed correct patient, procedure, site, and patient consented. Anesthesia Topical anesthesia was used. Anesthetic medications included Lidocaine 2%, Proparacaine 0.5%. Procedure Preparation included 5% betadine to ocular surface, eyelid speculum. A (33g) needle was used. Injection: 2 mg aflibercept Alfonse Flavors) SOLN   NDC: A3590391, Lot: [id number], Expiration date: 03/05/2021   Route:  Intravitreal, Site: Left Eye, Waste: 0.05 mL Post-op Post injection exam found visual acuity of at least counting fingers. The patient tolerated the procedure well. There were no complications. The patient received written and verbal post procedure care education. Post injection medications were not given.   OCT, Retina - OU - Both Eyes  Result Date: 12/10/2020 Right Eye Quality was good. Central Foveal Thickness: 479. Progression has been stable. Findings include pigment epithelial detachment, epiretinal membrane, subretinal hyper-reflective material, retinal drusen , abnormal foveal contour, no SRF, intraretinal fluid, disciform scar, outer retinal tubulation, outer retinal atrophy (Stable sub-retinal scar, mild persistent cystic changes overlying). Left Eye Quality was good. Central Foveal Thickness: 300. Progression has been stable. Findings include subretinal hyper-reflective material, pigment epithelial detachment, intraretinal fluid, retinal drusen , abnormal foveal contour, outer retinal atrophy, subretinal fluid (stable improvement in central [facility] and overlying IRF/cystic changes). Notes *Images captured and stored on drive Diagnosis / Impression: Exudative ARMD OU OD: Stable sub-retinal scar, mild persistent cystic changes overlying OS: stable improvement in central SRHM and overlying IRF/cystic changes Clinical management: See below Abbreviations: NFP - Normal foveal profile. CME - cystoid macular edema. PED - pigment epithelial detachment. IRF - intraretinal fluid. SRF - subretinal fluid. EZ - ellipsoid zone. ERM - epiretinal membrane. ORA - outer retinal atrophy. ORT - outer retinal tubulation. SRHM - subretinal hyper-reflective material     ASSESSMENT:  1. Stage IIIb (T4AN1A) poorly differentiated right colon adenocarcinoma: -Right hemicolectomy on 04/07/2020 with poorly differentiated adenocarcinoma, pT4a, positive radial margin, 1/15 lymph nodes positive, loss of MLH1 and PMS2, bladder biopsy  negative for malignancy. -CTAP on 04/06/2020 showed circumferential ascending colon mass with numerous borderline enlarged pericolonic lymph nodes. No findings of hepatic metastatic disease. -CEA on 04/04/2020 was 12.2. CEA improved to 1.7 on 05/13/2020. -PET scan on 05/11/2020 shows mild FDG uptake associated with the anastomotic site. Small pulmonary nodules that do not show elevated FDG activity, remain nonspecific, subcentimeter. Persistent but improved bladder wall thickening. -Cycle 1 of dose reduced FOLFOX on 05/19/2020, followed by 2 hospitalizations, 1 from acute kidney injury from diarrhea and second admission for C. difficile colitis. -Cycle 2 of 5-FU and leucovorin dose reduced on 06/30/2020.Chemotherapy discontinued secondary to intolerance. -Follow-up CT scan on 11/02/2020 with multiple soft tissue lesions in the central and right mesentery measuring up to 3.1 x 1.7 cm. Mild lymphadenopathy in the hepatic duodenal ligament, retroperitoneal space, right common iliac chain, bilateral external iliac chains. 17 mm soft tissue nodule in the lower anterior abdominal wall. Bilateral tiny pulmonary nodules not substantially changed. 2.9 x 2.5 cm low-density lesion along the posterior uterus is indeterminate. -Pembrolizumab started on 11/10/2020.  2. Family history: -Paternal uncle had colon cancer. Maternal aunt had brain cancer and another maternal aunt had gynecological malignancy. Father had lung cancer and was a smoker. - Genetic testing did not reveal any notable mutations.  3. Diffuse erythema and nodularity of the dome of the bladder: -CT scan showed severely thickened  bladder wall. -Cystoscopy on 04/08/2020 showed diffuse erythema, nodularity in the posterior wall tracking to the dome. Ureteral orifices were in normal locations. Biopsies were benign.   PLAN:  1.Stage IV right colon adenocarcinoma, MSI-high: -She did not experience any immunotherapy related side effects. -  She has about 2 loose stools per day which is her baseline. - Reviewed her labs which showed normal LFTs.  CEA was 5.9 on 11/05/2020. - Last TSH was normal at 1.8. - She will proceed with cycle 3 today.  Plan to rescan her after 4 cycles along with repeat CEA level.   2.  Paroxysmal atrial fibrillation: -Continue Eliquis twice daily.  No bleeding issues.  3.  Hypertension: - Continue Hyzaar and amlodipine.   Orders placed this encounter:  No orders of the defined types were placed in this encounter.    Derek Jack, MD [place] [phone]   I, Milinda Antis, am acting as a scribe for Dr. Sanda Linger.  I, Derek Jack MD, have reviewed the above documentation for accuracy and completeness, and I agree with the above.

## 2020-12-24 NOTE — Addendum Note (Signed)
Addended by: Joie Bimler on: 12/24/2020 02:12 PM   Modules accepted: Orders

## 2021-01-08 ENCOUNTER — Other Ambulatory Visit: Payer: Self-pay | Admitting: Family Medicine

## 2021-01-11 NOTE — Progress Notes (Signed)
[facility] Note  01/14/2021     CHIEF COMPLAINT Patient presents for Retina Follow Up   HISTORY OF PRESENT ILLNESS: Gabrielle [name] is a 75 y.o. female who presents to the clinic today for:   HPI    Retina Follow Up    Patient presents with  Wet AMD.  In both eyes.  Severity is moderate.  Duration of 5 weeks.  Since onset it is stable.  I, the attending physician,  performed the HPI with the patient and updated documentation appropriately.          Comments    Patient states vision the same OU. Using cosopt once daily OU.       Last edited by Bernarda Caffey, MD on 01/14/2021 11:00 AM. (History)    pt states    Referring physician: Janora Norlander, DO [place],  [place] [id number]  HISTORICAL INFORMATION:   Selected notes from the MEDICAL RECORD NUMBER Referred by Dr. Cristela Blue for concern of ARMD OU;  LEE- 01.03.19 Cristela Blue) [BCVA OD: 10/50 OS: 20/80] Ocular Hx- ARMD OU, DME PMH- elevated chol., HTN    CURRENT MEDICATIONS: Current Outpatient Medications (Ophthalmic Drugs)  Medication Sig  . dorzolamide-timolol (COSOPT) 22.3-6.8 MG/ML ophthalmic solution PLACE 1 DROP INTO BOTH EYES 2 TIMES A DAY (Patient taking differently: Place 1 drop into both eyes 1 day or 1 dose.)   No current facility-administered medications for this visit. (Ophthalmic Drugs)   Current Outpatient Medications (Other)  Medication Sig  . alendronate (FOSAMAX) 70 MG tablet Take 1 tablet (70 mg total) by mouth every 7 (seven) days. Take with a full glass of water on an empty stomach.  Marland Kitchen amLODipine (NORVASC) 5 MG tablet Take 1 tablet (5 mg total) by mouth daily.  Marland Kitchen atorvastatin (LIPITOR) 20 MG tablet Take 1 tablet (20 mg total) by mouth daily.  . cholecalciferol (VITAMIN D3) 25 MCG (1000 UT) tablet Take 1,000 Units by mouth daily.  Marland Kitchen CRANBERRY FRUIT PO Take 1 tablet by mouth daily.  . diclofenac Sodium (VOLTAREN) 1 % GEL Apply 2 g topically 4 (four) times  daily.   Marland Kitchen ELIQUIS 5 MG TABS tablet Take 5 mg by mouth 2 (two) times daily.  . FEROSUL 325 (65 Fe) MG tablet TAKE  (1)  TABLET TWICE A DAY WITH MEALS (BREAKFAST AND SUPPER)  . Garlic 924 MG TABS Take 100 mg by mouth daily.  Marland Kitchen LEUCOVORIN CALCIUM IV Inject into the vein every 14 (fourteen) days.   Marland Kitchen lidocaine-prilocaine (EMLA) cream Apply a small amount to port a cath site and cover with plastic wrap 1 hour prior to chemotherapy appointments  . losartan-hydrochlorothiazide (HYZAAR) 100-25 MG tablet Take 1 tablet by mouth daily.  . OXALIPLATIN IV Inject into the vein every 14 (fourteen) days.   . potassium chloride SA (KLOR-CON) 20 MEQ tablet Take 1 tablet (20 mEq total) by mouth daily.  . prochlorperazine (COMPAZINE) 10 MG tablet Take 1 tablet (10 mg total) by mouth every 6 (six) hours as needed for nausea or vomiting.  . saccharomyces boulardii (FLORASTOR) 250 MG capsule Take by mouth.    No current facility-administered medications for this visit. (Other)      REVIEW OF SYSTEMS: ROS    Positive for: Genitourinary, Musculoskeletal, Eyes   Negative for: Constitutional, Gastrointestinal, Neurological, Skin, HENT, Endocrine, Cardiovascular, Respiratory, Psychiatric, Allergic/Imm, Heme/Lymph   Last edited by Roselee Nova D, COT on 01/14/2021  8:25 AM. (History)  ALLERGIES Allergies  Allergen Reactions  . Lisinopril Cough    PAST MEDICAL HISTORY Past Medical History:  Diagnosis Date  . Arthritis   . Cancer ([place])   . Cataract    OU  . Colon cancer ([place])   . Family history of pancreatic cancer 08/21/2020  . Family history of uterine cancer 08/21/2020  . H/O cesarean section   . Hx of tonsillectomy   . Hyperlipidemia   . Hypertension   . Macular degeneration    Exu ARMD OU  . Paroxysmal atrial fibrillation ([place]) 07/05/2020   Past Surgical History:  Procedure Laterality Date  . BIOPSY  04/06/2020   Procedure: BIOPSY;  Surgeon: Rogene Houston, MD;  Location: [facility];  Service: Endoscopy;;  . CARPAL TUNNEL RELEASE Right   . CATARACT EXTRACTION W/PHACO Left 10/11/2019   Procedure: CATARACT EXTRACTION PHACO AND INTRAOCULAR LENS PLACEMENT (IOC);  Surgeon: Baruch Goldmann, MD;  Location: [facility] ORS;  Service: Ophthalmology;  Laterality: Left;  CDE: 8.56  . CATARACT EXTRACTION W/PHACO Right 10/25/2019   Procedure: CATARACT EXTRACTION PHACO AND INTRAOCULAR LENS PLACEMENT (IOC);  Surgeon: Baruch Goldmann, MD;  Location: [facility];  Service: Ophthalmology;  Laterality: Right;  CDE: 5.67  . CESAREAN SECTION    . COLONOSCOPY N/A 04/06/2020   Procedure: COLONOSCOPY;  Surgeon: Rogene Houston, MD;  Location: [facility];  Service: Endoscopy;  Laterality: N/A;  . CYSTOSCOPY WITH BIOPSY N/A 04/08/2020   Procedure: CYSTOSCOPY WITH BIOPSY;  Surgeon: Cleon Gustin, MD;  Location: [facility];  Service: Urology;  Laterality: N/A;  . ESOPHAGOGASTRODUODENOSCOPY N/A 04/05/2020   Procedure: ESOPHAGOGASTRODUODENOSCOPY (EGD);  Surgeon: Rogene Houston, MD;  [facility];  Service: Endoscopy;  Laterality: N/A;  . PARTIAL COLECTOMY N/A 04/08/2020   Procedure: PARTIAL COLECTOMY;  Surgeon: Virl Cagey, MD;  Location: [facility];  Service: General;  Laterality: N/A;  . PORTACATH PLACEMENT Left 05/18/2020   Procedure: INSERTION PORT-A-CATH (ATTACHED CATHETER IN LEFT SUBCLAVIAN);  Surgeon: Virl Cagey, MD;  Location: [facility];  Service: General;  Laterality: Left;  . TONSILLECTOMY      FAMILY HISTORY Family History  Problem Relation Age of Onset  . Macular degeneration Mother   . Stroke Mother   . Hypertension Mother   . Dementia Mother   . Lung cancer Father        dx late 32s; smoking hx  . Diabetes Sister   . Hypertension Sister   . Leukemia Paternal Uncle        d. 13s  . Cancer Maternal Aunt        ovarian or endometrial dx 51s  . Pancreatic cancer Paternal Uncle        d. late 80s    SOCIAL HISTORY Social History   Tobacco Use  . Smoking status:  Never Smoker  . Smokeless tobacco: Never Used  Vaping Use  . Vaping Use: Never used  Substance Use Topics  . Alcohol use: No  . Drug use: No         OPHTHALMIC EXAM:  Base Eye Exam    Visual Acuity (Snellen - Linear)      Right Left   Dist cc CF at 2' 20/80 -2   Dist ph cc NI NI   Correction: Glasses       Tonometry (Tonopen, 8:34 AM)      Right Left   Pressure 17 16       Pupils      Dark Light  Shape React APD   Right 4 3.5 Round Minimal +1   Left 4 3 Round Brisk None       Visual Fields (Counting fingers)      Left Right    Full    Restrictions  Central scotoma       Extraocular Movement      Right Left    Full, Ortho Full, Ortho       Neuro/Psych    Oriented x3: Yes   Mood/Affect: Normal       Dilation    Both eyes: 1.0% Mydriacyl, 2.5% Phenylephrine @ 8:34 AM        Slit Lamp and Fundus Exam    Slit Lamp Exam      Right Left   Lids/Lashes Dermatochalasis - upper lid Dermatochalasis - upper lid   Conjunctiva/Sclera White and quiet White and quiet   Cornea Arcus, 1-2+ Punctate epithelial erosions Arcus, 1-2+ Punctate epithelial erosions   Anterior Chamber Deep and quiet Deep and quiet   Iris Round and well dilated Round and well dilated   Lens PCIOL in good position PCIOL in good position   Vitreous Vitreous syneresis, Posterior vitreous detachment, vitreous condensations Vitreous syneresis, Posterior vitreous detachment       Fundus Exam      Right Left   Disc pink and sharp, compact pink and sharp, Compact, vascular loops superiorly   C/D Ratio 0.2 0.2   Macula Blunted foveal reflex, Drusen, Pigment clumping and atrophy, central thickening/disciform scar, +PED, no heme, persistent cystic changes overlying central scar Blunted foveal reflex, central thickening, RPE clumping and atrophy, Drusen, RPE rip, mild IRF/SRF overlying PED -- slightly improved, focal hemorrhage temporal side of central thickening/fibrosis -- improved   Vessels Vascular  attenuation, Tortuous Vascular attenuation, Tortuous   Periphery Attached, mild Reticular degeneration Attached, mild Reticular degeneration        Refraction    Wearing Rx      Sphere Cylinder Add   Right [place] Sphere +3.50   Left -0.50 Sphere +3.50   Type: PAL          IMAGING AND PROCEDURES  Imaging and Procedures for 03/21/18  OCT, Retina - OU - Both Eyes       Right Eye Quality was good. Central Foveal Thickness: 491. Progression has been stable. Findings include pigment epithelial detachment, epiretinal membrane, subretinal hyper-reflective material, retinal drusen , abnormal foveal contour, no SRF, intraretinal fluid, disciform scar, outer retinal tubulation, outer retinal atrophy (Stable sub-retinal scar, mild persistent cystic changes overlying).   Left Eye Quality was good. Central Foveal Thickness: 298. Progression has improved. Findings include subretinal hyper-reflective material, pigment epithelial detachment, intraretinal fluid, retinal drusen , abnormal foveal contour, outer retinal atrophy, subretinal fluid (stable improvement in central SRHM and mild interval improvement in overlying IRF/cystic changes).   Notes *Images captured and stored on drive  Diagnosis / Impression:  Exudative ARMD OU OD: Stable sub-retinal scar, mild persistent cystic changes overlying OS: stable improvement in central SRHM and mild interval improvement in overlying IRF/cystic changes  Clinical management:  See below  Abbreviations: NFP - Normal foveal profile. CME - cystoid macular edema. PED - pigment epithelial detachment. IRF - intraretinal fluid. SRF - subretinal fluid. EZ - ellipsoid zone. ERM - epiretinal membrane. ORA - outer retinal atrophy. ORT - outer retinal tubulation. SRHM - subretinal hyper-reflective material        Intravitreal Injection, Pharmacologic Agent - OS - Left Eye       Time  Out 01/14/2021. 8:58 AM. Confirmed correct patient, procedure, site, and  patient consented.   Anesthesia Topical anesthesia was used. Anesthetic medications included Lidocaine 2%, Proparacaine 0.5%.   Procedure Preparation included 5% betadine to ocular surface, eyelid speculum. A (33g) needle was used.   Injection:  2 mg aflibercept Alfonse Flavors) SOLN   NDC: [id number], Lot: [id number], Expiration date: 04/04/2021   Route: Intravitreal, Site: Left Eye, Waste: 0.05 mL  Post-op Post injection exam found visual acuity of at least counting fingers. The patient tolerated the procedure well. There were no complications. The patient received written and verbal post procedure care education. Post injection medications were not given.                 ASSESSMENT/PLAN:    ICD-10-CM   1. Exudative age-related macular degeneration of both eyes with active choroidal neovascularization (HCC)  H35.3231 Intravitreal Injection, Pharmacologic Agent - OS - Left Eye    aflibercept (EYLEA) SOLN 2 mg  2. Retinal edema  H35.81 OCT, Retina - OU - Both Eyes  3. Posterior vitreous detachment of both eyes  H43.813   4. Pseudophakia of both eyes  Z96.1   5. Ocular hypertension, bilateral  H40.053     1,2. Exudative age related macular degeneration, both eyes.    - severe exudative disease with very active CNVM OU at presentation in January 2019  - S/P IVA OD #1 (01.04.19), #2 (02.15.19)  - S/P IVA OS #1 (01.18.19), #2 (02.15.19)  - switched to Eylea 3.18.19 due to severity of disease  - S/P IVE OD #1 (03.18.19), #2 (04.17.19), #3 (05.15.19) -- injections held due to stable disciform scar, #4 (10.02.19),  #5 (01.15.20), #6 (06.04.20), #7 (10.29.20), #8 (01.21.21), #9 (05.27.21), #10 (07.22.21)  - S/P IVE OS #1 (03.18.19), #2 (04.17.19), #3 (05.15.19), #4 (06.12.19), #5 (07.10.19), #6 (08.07.19), #7 (09.04.19), #8 (10.02.19), #9 (11.06.19), #10 (12.11.19), #11 (01.15.20), #12 (02.20.20), #13 (03.26.20), #14 (04.30.20), #15 (06.04.20), #16 (07.16.20), # 17 (08.20.20), #18 (09.24.20),  #19 (10.29.20), #20 (12.03.20), #21 (01.21.21), #22 (03.18.21), #23 (05.27.21), #24 (07.22.21), #25 (11.11.21), #26 (12.09.21), #27 (01.06.22)  - IVE held from 7.22.21 to 11.11.21 due to TIA/stroke on 8.1.21  - OCT OS today stable improvement in central [facility] and mild interval improvement in overlying IRF/cystic changes  - exam OS shows macular heme improving  - OD with significant subretinal disciform scar - mild persistent cystic changes overlying IRHM  - BCVA OD stable at CF 3'; OS 20/80   - recommend IVE OS #28 today, 02.07.22 -- will cont to hold OD  - Eylea informed consent form signed and scanned on 01.21.2021  - Eylea4U paperwork filled out on 02.15.19 and fully approved through [employer] -- approved  - f/u in 6 weeks, sooner prn for DFE/OCT/possible injection(s)  3. PVD / vitreous syneresis  - Discussed findings and prognosis   - No RT or RD on 360  exam  - Reviewed s/s of RT/RD  - Strict return precautions for any such RT/RD signs/symptoms  4. Pseudophakia OU  - s/p CE/IOL (Dr. Marisa Hua, 11.2020)  - beautiful surgeries w/ IOLs in excellent position, doing well  - monitor  5. Ocular Hypertension OU  - IOP improved today --  17,16  - cont Cosopt qd OU --   Ophthalmic Meds Ordered this visit:  Meds ordered this encounter  Medications  . aflibercept (EYLEA) SOLN 2 mg       Return in about 6 weeks (around 02/25/2021) for f/u exu ARMD OU, DFE,  OCT.  There are no Patient Instructions on file for this visit.  This document serves as a record of services personally performed by Gardiner Sleeper, MD, PhD. It was created on their behalf by Leonie Douglas, an ophthalmic technician. The creation of this record is the provider's dictation and/or activities during the visit.    Electronically signed by: Leonie Douglas COA, 01/14/21  11:10 AM   This document serves as a record of services personally performed by Gardiner Sleeper, MD, PhD. It was created on their behalf by San Jetty. Owens Shark,  OA an ophthalmic technician. The creation of this record is the provider's dictation and/or activities during the visit.    Electronically signed by: San Jetty. Owens Shark, [place] 02.10.2022 11:10 AM  Gardiner Sleeper, M.D., Ph.D. Diseases & Surgery of the Retina and Vitreous [facility]  I have reviewed the above documentation for accuracy and completeness, and I agree with the above. Gardiner Sleeper, M.D., Ph.D. 01/14/21 11:10 AM  Abbreviations: M myopia (nearsighted); A astigmatism; H hyperopia (farsighted); P presbyopia; Mrx spectacle prescription;  CTL contact lenses; OD right eye; OS left eye; OU both eyes  XT exotropia; ET esotropia; PEK punctate epithelial keratitis; PEE punctate epithelial erosions; DES dry eye syndrome; MGD meibomian gland dysfunction; ATs artificial tears; PFAT's preservative free artificial tears; [place] nuclear sclerotic cataract; PSC posterior subcapsular cataract; ERM epi-retinal membrane; PVD posterior vitreous detachment; RD retinal detachment; DM diabetes mellitus; DR diabetic retinopathy; NPDR non-proliferative diabetic retinopathy; PDR proliferative diabetic retinopathy; CSME clinically significant macular edema; DME diabetic macular edema; dbh dot blot hemorrhages; CWS cotton wool spot; POAG primary open angle glaucoma; C/D cup-to-disc ratio; HVF humphrey visual field; GVF goldmann visual field; OCT optical coherence tomography; IOP intraocular pressure; BRVO Branch retinal vein occlusion; CRVO central retinal vein occlusion; CRAO central retinal artery occlusion; BRAO branch retinal artery occlusion; RT retinal tear; SB scleral buckle; PPV pars plana vitrectomy; VH Vitreous hemorrhage; PRP panretinal laser photocoagulation; IVK intravitreal kenalog; VMT vitreomacular traction; MH Macular hole;  NVD neovascularization of the disc; NVE neovascularization elsewhere; AREDS age related eye disease study; ARMD age related macular degeneration; POAG primary open  angle glaucoma; EBMD epithelial/anterior basement membrane dystrophy; ACIOL anterior chamber intraocular lens; IOL intraocular lens; PCIOL posterior chamber intraocular lens; Phaco/IOL phacoemulsification with intraocular lens placement; Johnson photorefractive keratectomy; LASIK laser assisted in situ keratomileusis; HTN hypertension; DM diabetes mellitus; COPD chronic obstructive pulmonary disease

## 2021-01-12 DIAGNOSIS — R769 Abnormal immunological finding in serum, unspecified: Secondary | ICD-10-CM | POA: Diagnosis not present

## 2021-01-12 DIAGNOSIS — I4891 Unspecified atrial fibrillation: Secondary | ICD-10-CM | POA: Diagnosis not present

## 2021-01-13 ENCOUNTER — Inpatient Hospital Stay (HOSPITAL_COMMUNITY): Payer: Medicare HMO

## 2021-01-13 ENCOUNTER — Inpatient Hospital Stay (HOSPITAL_COMMUNITY): Payer: Medicare HMO | Admitting: Hematology

## 2021-01-13 DIAGNOSIS — R768 Other specified abnormal immunological findings in serum: Secondary | ICD-10-CM | POA: Insufficient documentation

## 2021-01-13 DIAGNOSIS — R7689 Other specified abnormal immunological findings in serum: Secondary | ICD-10-CM | POA: Insufficient documentation

## 2021-01-14 ENCOUNTER — Encounter (INDEPENDENT_AMBULATORY_CARE_PROVIDER_SITE_OTHER): Payer: Self-pay | Admitting: Ophthalmology

## 2021-01-14 ENCOUNTER — Other Ambulatory Visit: Payer: Self-pay

## 2021-01-14 ENCOUNTER — Ambulatory Visit (INDEPENDENT_AMBULATORY_CARE_PROVIDER_SITE_OTHER): Payer: Medicare HMO | Admitting: Ophthalmology

## 2021-01-14 DIAGNOSIS — H43813 Vitreous degeneration, bilateral: Secondary | ICD-10-CM | POA: Diagnosis not present

## 2021-01-14 DIAGNOSIS — H353231 Exudative age-related macular degeneration, bilateral, with active choroidal neovascularization: Secondary | ICD-10-CM | POA: Diagnosis not present

## 2021-01-14 DIAGNOSIS — H40053 Ocular hypertension, bilateral: Secondary | ICD-10-CM

## 2021-01-14 DIAGNOSIS — H3581 Retinal edema: Secondary | ICD-10-CM | POA: Diagnosis not present

## 2021-01-14 DIAGNOSIS — Z961 Presence of intraocular lens: Secondary | ICD-10-CM

## 2021-01-14 MED ORDER — AFLIBERCEPT 2MG/0.05ML IZ SOLN FOR KALEIDOSCOPE
2.0000 mg | INTRAVITREAL | Status: AC | PRN
  Administered 2021-01-14: 2 mg via INTRAVITREAL

## 2021-01-18 ENCOUNTER — Inpatient Hospital Stay (HOSPITAL_COMMUNITY): Payer: Medicare HMO

## 2021-01-18 ENCOUNTER — Other Ambulatory Visit: Payer: Self-pay

## 2021-01-18 ENCOUNTER — Encounter (HOSPITAL_COMMUNITY): Payer: Self-pay | Admitting: Hematology

## 2021-01-18 ENCOUNTER — Inpatient Hospital Stay (HOSPITAL_COMMUNITY): Payer: Medicare HMO | Attending: Hematology | Admitting: Hematology

## 2021-01-18 VITALS — BP 149/64 | HR 69 | Temp 96.8°F | Resp 18 | Wt 148.4 lb

## 2021-01-18 VITALS — BP 147/49 | HR 63 | Temp 96.6°F | Resp 17

## 2021-01-18 DIAGNOSIS — C182 Malignant neoplasm of ascending colon: Secondary | ICD-10-CM | POA: Insufficient documentation

## 2021-01-18 DIAGNOSIS — Z5112 Encounter for antineoplastic immunotherapy: Secondary | ICD-10-CM | POA: Diagnosis not present

## 2021-01-18 DIAGNOSIS — C189 Malignant neoplasm of colon, unspecified: Secondary | ICD-10-CM

## 2021-01-18 DIAGNOSIS — Z79899 Other long term (current) drug therapy: Secondary | ICD-10-CM | POA: Diagnosis not present

## 2021-01-18 LAB — COMPREHENSIVE METABOLIC PANEL
ALT: 13 U/L (ref 0–44)
AST: 19 U/L (ref 15–41)
Albumin: 4.2 g/dL (ref 3.5–5.0)
Alkaline Phosphatase: 59 U/L (ref 38–126)
Anion gap: 8 (ref 5–15)
BUN: 31 mg/dL — ABNORMAL HIGH (ref 8–23)
CO2: 29 mmol/L (ref 22–32)
Calcium: 9.7 mg/dL (ref 8.9–10.3)
Chloride: 99 mmol/L (ref 98–111)
Creatinine, Ser: 1.03 mg/dL — ABNORMAL HIGH (ref 0.44–1.00)
GFR, Estimated: 57 mL/min — ABNORMAL LOW (ref >60)
Glucose, Bld: 100 mg/dL — ABNORMAL HIGH (ref 70–99)
Potassium: 3.8 mmol/L (ref 3.5–5.1)
Sodium: 136 mmol/L (ref 135–145)
Total Bilirubin: 0.8 mg/dL (ref 0.3–1.2)
Total Protein: 7.8 g/dL (ref 6.5–8.1)

## 2021-01-18 LAB — CBC WITH DIFFERENTIAL/PLATELET
Abs Immature Granulocytes: 0.02 10*3/uL (ref 0.00–0.07)
Basophils Absolute: 0 10*3/uL (ref 0.0–0.1)
Basophils Relative: 1 %
Eosinophils Absolute: 0.2 10*3/uL (ref 0.0–0.5)
Eosinophils Relative: 5 %
HCT: 37.9 % (ref 36.0–46.0)
Hemoglobin: 12.6 g/dL (ref 12.0–15.0)
Immature Granulocytes: 0 %
Lymphocytes Relative: 27 %
Lymphs Abs: 1.3 10*3/uL (ref 0.7–4.0)
MCH: 31.3 pg (ref 26.0–34.0)
MCHC: 33.2 g/dL (ref 30.0–36.0)
MCV: 94.3 fL (ref 80.0–100.0)
Monocytes Absolute: 0.5 10*3/uL (ref 0.1–1.0)
Monocytes Relative: 10 %
Neutro Abs: 2.7 10*3/uL (ref 1.7–7.7)
Neutrophils Relative %: 57 %
Platelets: 130 10*3/uL — ABNORMAL LOW (ref 150–400)
RBC: 4.02 MIL/uL (ref 3.87–5.11)
RDW: 13.6 % (ref 11.5–15.5)
WBC: 4.8 10*3/uL (ref 4.0–10.5)
nRBC: 0 % (ref 0.0–0.2)

## 2021-01-18 LAB — TSH: TSH: 2.296 u[IU]/mL (ref 0.350–4.500)

## 2021-01-18 IMAGING — CT CT ABD-PELV W/O CM
2 of 4 series · 14 of 46 positions shown, 16 images · non-contrast
Comparison: PET-CT 05/11/2020 and CT abdomen from 04/06/2020

CLINICAL DATA: Vomiting and diarrhea. Ascending colon cancer, prior
right hemicolectomy.

EXAM:
CT ABDOMEN AND PELVIS WITHOUT CONTRAST
TECHNIQUE: Multidetector CT imaging of the abdomen and pelvis was performed
following the standard protocol without IV contrast.

[Series 2: axial st · axial · 0.70mm/px · z∈[+772,+1202]mm · 11 of 96 slices shown, 13 images]
[im 5/96  soft-tissue]
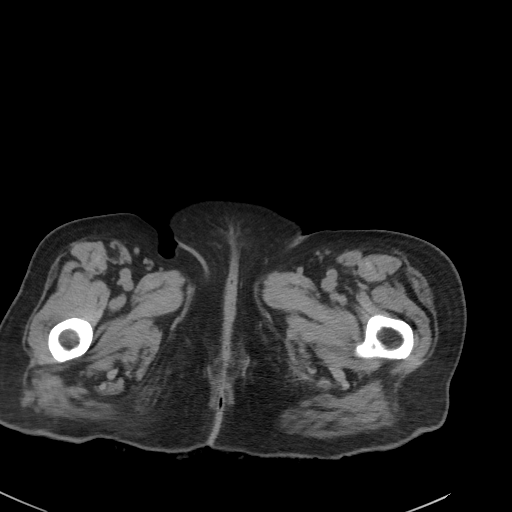
[im 5/96  bone]
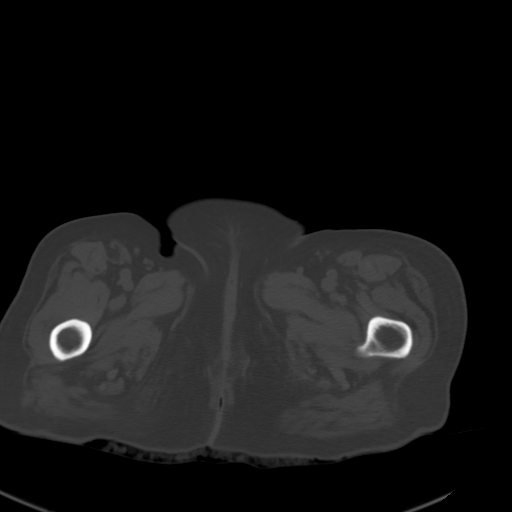
[im 15/96  soft-tissue]
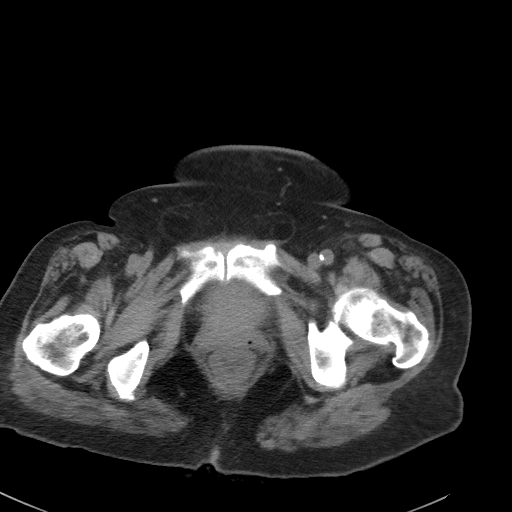
[im 24/96  soft-tissue]
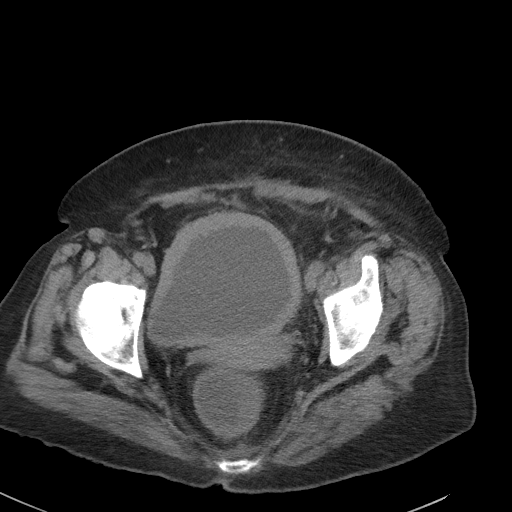
[im 34/96  soft-tissue]
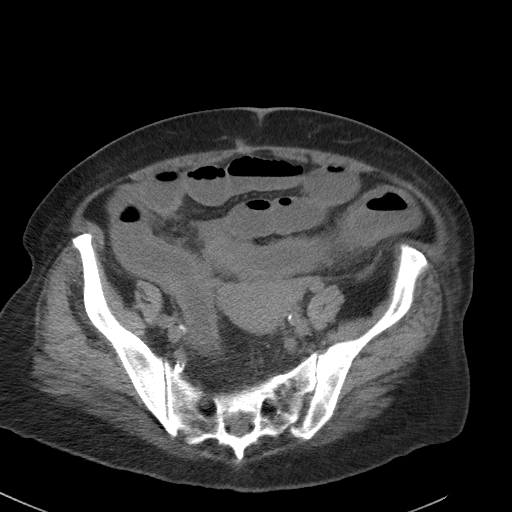
[im 39/96  soft-tissue]
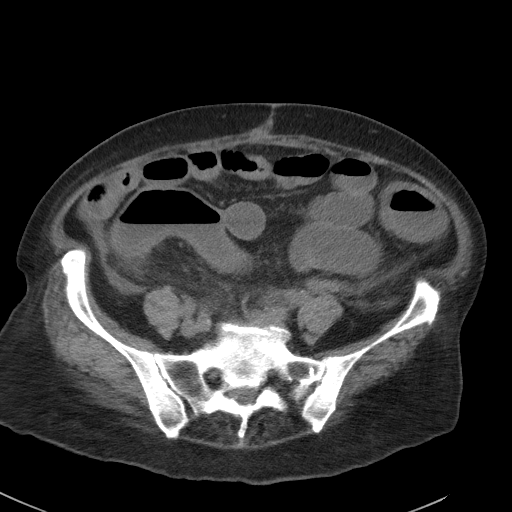
[im 48/96  soft-tissue]
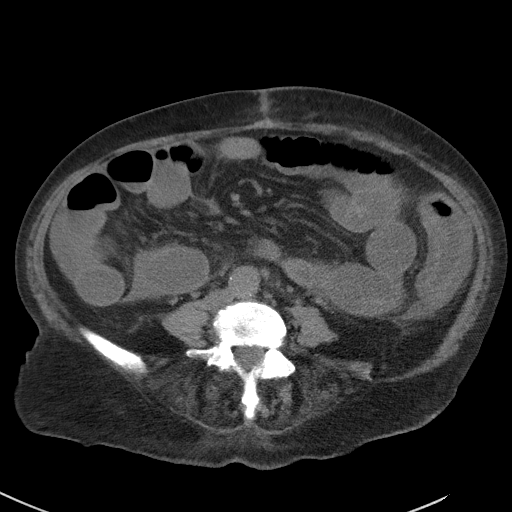
[im 58/96  soft-tissue]
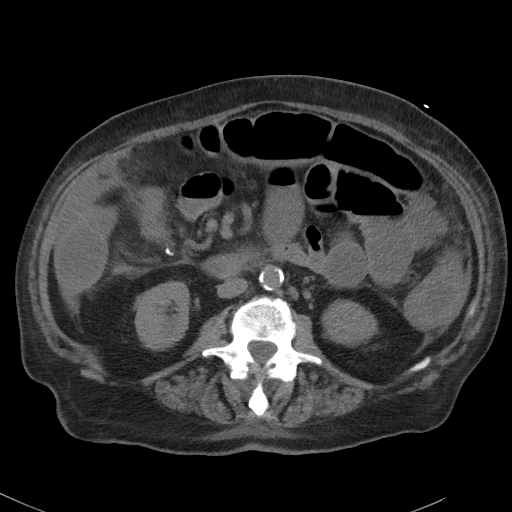
[im 62/96  soft-tissue]
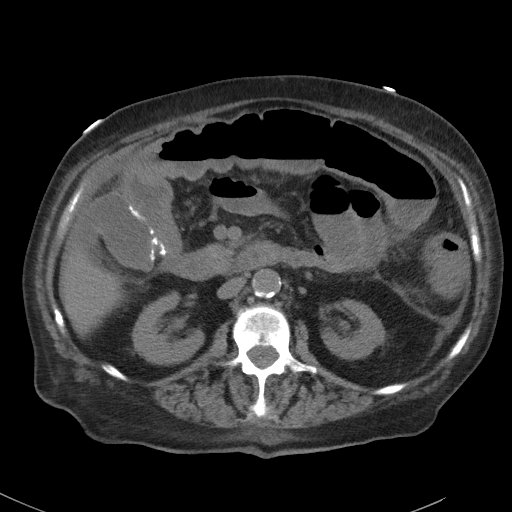
[im 72/96  soft-tissue]
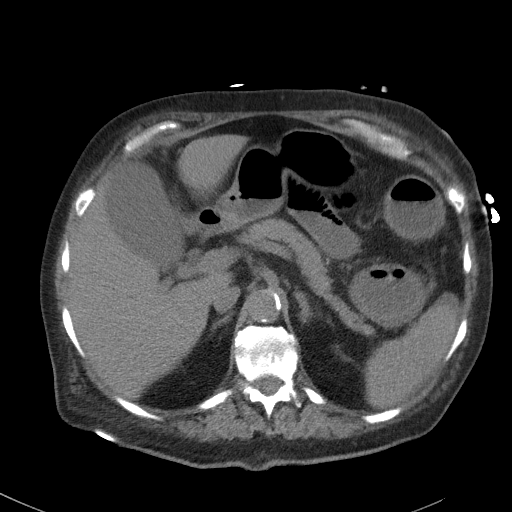
[im 72/96  bone]
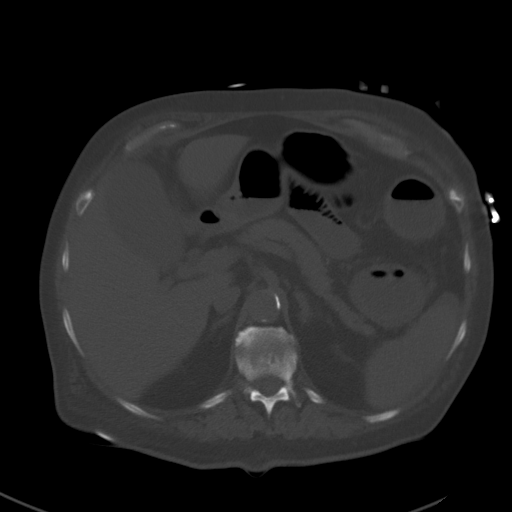
[im 81/96  soft-tissue]
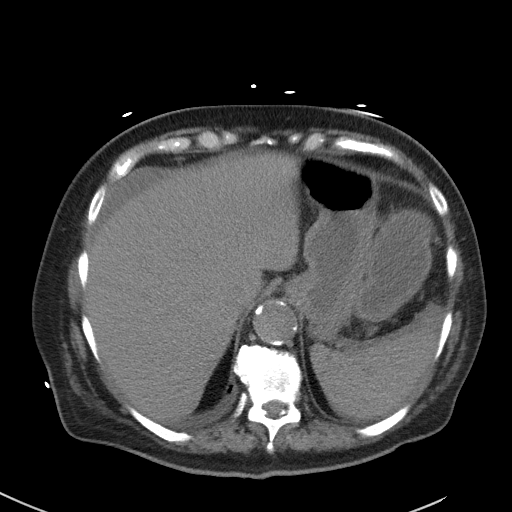
[im 91/96  soft-tissue]
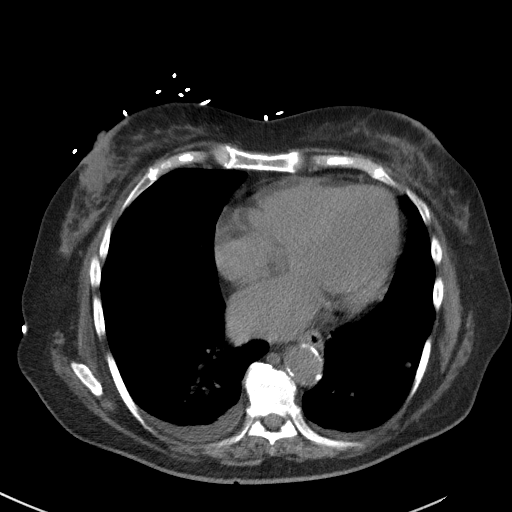

[Series 5: coronal st · coronal · 0.70mm/px · 3 of 88 slices shown]
[im 30/88  soft-tissue]
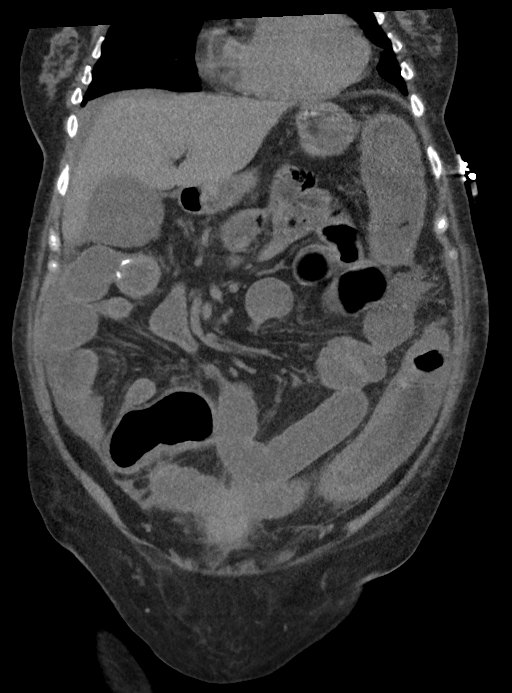
[im 39/88  soft-tissue]
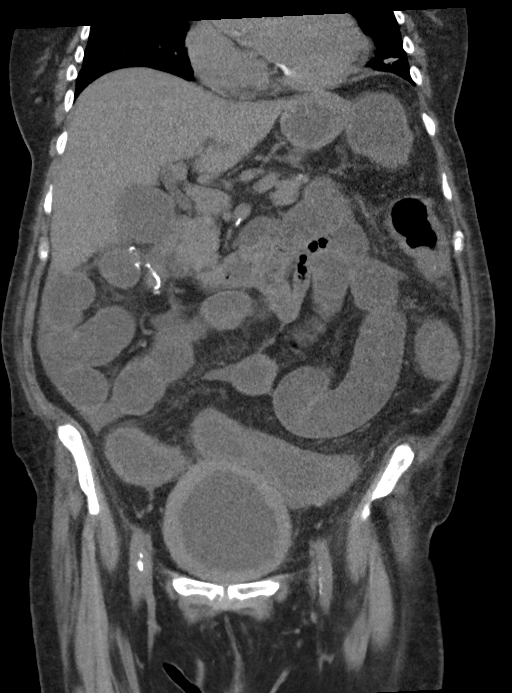
[im 49/88  soft-tissue]
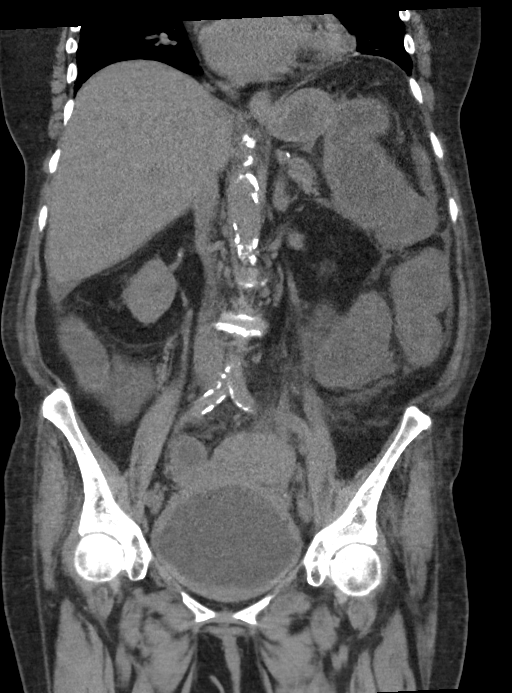

[14 of 46 positions shown; findings below may reference images not displayed]

FINDINGS: Lower chest: Descending thoracic aortic atherosclerotic
calcification. Mild mitral and aortic valve calcification.
Low-density blood pool raise the possibility of anemia. Small right
and trace left pleural effusion. Mild cardiomegaly. Bandlike density
in the left lower lobe favoring atelectasis. Left lower lobe nodule
0.5 cm in diameter on image [DATE], stable. Subtle nodularity medially
in the right lower lobe on images 29-34 of series 4, nonspecific.
There is some faint geographic ground-glass opacities in the lower
lobes

Hepatobiliary: Borderline dilated gallbladder. No significant
extrahepatic biliary dilatation. No obvious noncontrast CT
abnormality of the hepatic parenchyma.

Pancreas: Unremarkable

Spleen: Unremarkable

Adrenals/Urinary Tract: Both adrenal glands appear normal.

Hyperdense peripheral 6 mm lesion of the right kidney upper pole on
image 33/2 is stable and probably a complex cyst but technically
nonspecific. Other hypodense bilateral renal lesions are probably
cysts. No hydronephrosis or hydroureter. There is diffuse urinary
bladder wall thickening which could reflect cystitis but was also
present on the prior PET-CT from 05/11/2020.

Stomach/Bowel: Right hemicolectomy with diffuse wall thickening in
the remaining colon especially the descending colon and sigmoid
colon, with air-fluid levels. There is some mildly dilated loops of
small bowel in the abdomen along with some additional borderline
dilated loops. A few small segments of small-bowel are not
particularly dilated. There scattered air-fluid levels in the small
bowel loops no specific lead point for obstruction identified. No
pneumatosis or compelling findings of breakdown of the coloenteric
anastomotic site in the right abdomen.

Vascular/Lymphatic: Aortoiliac atherosclerotic vascular disease. No
pathologic adenopathy.

Reproductive: Unremarkable

Other: New mild ascites, most notable along the paracolic gutters
and in the perihepatic and perisplenic regions. New mesenteric
edema.

Musculoskeletal: Thoracic and lumbar spondylosis. Mid and lower
lumbar degenerative disc disease.
IMPRESSION: 1. Diffuse wall thickening in the remaining colon especially the
descending colon and sigmoid colon, with air-fluid levels. There is
some mildly dilated loops of small bowel in the abdomen along with
scattered air-fluid levels. No specific lead point for obstruction
identified. Appearance suggests enterocolitis potentially with
ileus. Given the thickening in the distal colon, consider testing
for C difficile toxin. Strictly speaking on today's noncontrast
examination I cannot exclude ischemia, but no pneumatosis is
observed.
2. New mild ascites and mesenteric edema.
3. Small right and trace left pleural effusions.
4. Low-density blood pool raise the possibility of anemia.
5. Mild cardiomegaly. Mild mitral and aortic valve calcification.
6. Stable 0.5 cm in diameter left lower lobe pulmonary nodule,
nonspecific.
7. Hyperdense peripheral 6 mm lesion of the right kidney upper pole
is stable and probably a complex cyst but technically nonspecific.
Other hypodense bilateral renal lesions are probably cysts.
8. Subtle nodularity medially in the right lower lobe on images
29-34 of series 4, nonspecific.
9. Diffuse urinary bladder wall thickening could reflect cystitis
but was also present on the prior PET-CT from 05/11/2020.
10. Aortic atherosclerosis.

Aortic Atherosclerosis ([id number]).

## 2021-01-18 MED ORDER — HEPARIN SOD (PORK) LOCK FLUSH 100 UNIT/ML IV SOLN
500.0000 [IU] | Freq: Once | INTRAVENOUS | Status: AC | PRN
  Administered 2021-01-18: 500 [IU]

## 2021-01-18 MED ORDER — SODIUM CHLORIDE 0.9 % IV SOLN: Freq: Once | INTRAVENOUS | Status: AC

## 2021-01-18 MED ORDER — SODIUM CHLORIDE 0.9% FLUSH
10.0000 mL | INTRAVENOUS | Status: DC | PRN
  Administered 2021-01-18: 10 mL

## 2021-01-18 MED ORDER — SODIUM CHLORIDE 0.9 % IV SOLN
200.0000 mg | Freq: Once | INTRAVENOUS | Status: AC
  Administered 2021-01-18: 200 mg via INTRAVENOUS
  Filled 2021-01-18: qty 8

## 2021-01-18 NOTE — Patient Instructions (Signed)
[facility] Discharge Instructions for Patients Receiving Chemotherapy   Beginning January 23rd 2017 lab work for the Cancer Center will be done in the  Main lab at [facility] on 1st floor. If you have a lab appointment with the Cancer Center please come in thru the  Main Entrance and check in at the main information desk   Today you received the following chemotherapy agents Keytruda  To help prevent nausea and vomiting after your treatment, we encourage you to take your nausea medication   If you develop nausea and vomiting, or diarrhea that is not controlled by your medication, call the clinic.  The clinic phone number is [phone]. Office hours are Monday-Friday 8:30am-5:00pm.  BELOW ARE SYMPTOMS THAT SHOULD BE REPORTED IMMEDIATELY:  *FEVER GREATER THAN 101.0 F  *CHILLS WITH OR WITHOUT FEVER  NAUSEA AND VOMITING THAT IS NOT CONTROLLED WITH YOUR NAUSEA MEDICATION  *UNUSUAL SHORTNESS OF BREATH  *UNUSUAL BRUISING OR BLEEDING  TENDERNESS IN MOUTH AND THROAT WITH OR WITHOUT PRESENCE OF ULCERS  *URINARY PROBLEMS  *BOWEL PROBLEMS  UNUSUAL RASH Items with * indicate a potential emergency and should be followed up as soon as possible. If you have an emergency after office hours please contact your primary care physician or go to the nearest emergency department.  Please call the clinic during office hours if you have any questions or concerns.   You may also contact the Patient Navigator at [phone] should you have any questions or need assistance in obtaining follow up care.      Resources For Cancer Patients and their Caregivers ? American Cancer Society: Can assist with transportation, wigs, general needs, runs Look Good Feel Better.        1-888-227-6333 ? Cancer Care: Provides financial assistance, online support groups, medication/co-pay assistance.  1-800-813-HOPE (4673) ? [facility] Assists [place] cancer  patients and their families through emotional , educational and financial support.  336-427-4357 ? [place] DSS Where to apply for food stamps, Medicaid and utility assistance. 336-342-1394 ? [facility]: Transportation to medical appointments. 336-347-2287 ? Social Security Administration: May apply for disability if have a Stage IV cancer. 336-342-7796 1-800-772-1213 ? [place] Aging, Disability and Transit Services: Assists with nutrition, care and transit needs. 336-349-2343          

## 2021-01-18 NOTE — Progress Notes (Signed)
Patient was assessed by Dr. Katragadda and labs have been reviewed.  Patient is okay to proceed with treatment today. Primary RN and pharmacy aware.   

## 2021-01-18 NOTE — Progress Notes (Signed)
Gabrielle [name] presents today for D1C4 Keytruda. Pt denies any new changes or symptoms since last treatment. Lab results and vitals have been reviewed and are stable and within parameters for treatment. Patient has been assessed by Dr. Delton Coombes who has approved proceeding with treatment today as planned.  Infusions tolerated without incident or complaint. VSS upon completion of treatment. Port flushed and deaccessed per protocol, see MAR and IV flowsheet for details. Discharged in satisfactory condition with follow up instructions.

## 2021-01-18 NOTE — Patient Instructions (Signed)
[place] at [facility] Discharge Instructions  You were seen today by Dr. Delton Coombes. He went over your recent results. You received your treatment today. Continue drinking 2-3 liters of water daily to keep your kidneys flushed. [facility] to find out if you need to keep taking Eliquis. You will be scheduled to have a CT scan of your chest and abdomen before your next visit. Dr. Delton Coombes will see you back in 3 weeks for labs and follow up.   Thank you for choosing [place] at [facility] to provide your oncology and hematology care.  To afford each patient quality time with our provider, please arrive at least 15 minutes before your scheduled appointment time.   If you have a lab appointment with the [facility] please come in thru the Main Entrance and check in at the main information desk  You need to re-schedule your appointment should you arrive 10 or more minutes late.  We strive to give you quality time with our providers, and arriving late affects you and other patients whose appointments are after yours.  Also, if you no show three or more times for appointments you may be dismissed from the clinic at the providers discretion.     Again, thank you for choosing [facility].  Our hope is that these requests will decrease the amount of time that you wait before being seen by our physicians.       _____________________________________________________________  Should you have questions after your visit to [facility], please contact our office at [phone] between the hours of 8:00 a.m. and 4:30 p.m.  Voicemails left after 4:00 p.m. will not be returned until the following business day.  For prescription refill requests, have your pharmacy contact our office and allow 72 hours.    Cancer Center Support Programs:   > Cancer Support Group  2nd Tuesday of the month 1pm-2pm, Journey Room

## 2021-01-18 NOTE — Progress Notes (Signed)
Gabrielle [name], [place] [address]   CLINIC:  Medical Oncology/Hematology  PCP:  Janora Norlander, DO [address] [place] [address] [phone]   REASON FOR VISIT:  Follow-up for right colon cancer  PRIOR THERAPY:  1. Right hemicolectomy on 04/08/2020. 2. FOLFOX x 3 cycles from 05/19/2020 to 07/14/2020.  NGS Results: Foundation 1 KRAS/NRAS wild-type, MSI--high, TMB 55 Muts/Mb, BRAF V 600 E  CURRENT THERAPY: Keytruda every 3 weeks  BRIEF ONCOLOGIC HISTORY:  Oncology History  Malignant neoplasm of colon ([place])  04/07/2020 Initial Diagnosis   Malignant neoplasm of ascending colon ([place])   05/13/2020 Genetic Testing   Foundation One     05/19/2020 - 07/02/2020 Chemotherapy   The patient had palonosetron (ALOXI) injection 0.25 mg, 0.25 mg, Intravenous,  Once, 2 of 12 cycles Administration: 0.25 mg (05/19/2020), 0.25 mg (06/30/2020) leucovorin 522 mg in dextrose 5 % 250 mL infusion, 320 mg/m2 = 522 mg (80 % of original dose 400 mg/m2), Intravenous,  Once, 2 of 12 cycles Dose modification: 320 mg/m2 (80 % of original dose 400 mg/m2, Cycle 1, Reason: Provider Judgment), 250.0370 mg/m2 (66.7 % of original dose 400 mg/m2, Cycle 2, Reason: Provider Judgment) Administration: 522 mg (05/19/2020), 434 mg (06/30/2020) oxaliplatin (ELOXATIN) 110 mg in dextrose 5 % 500 mL chemo infusion, 68 mg/m2 = 110 mg (80 % of original dose 85 mg/m2), Intravenous,  Once, 1 of 1 cycle Dose modification: 68 mg/m2 (80 % of original dose 85 mg/m2, Cycle 1, Reason: Provider Judgment) Administration: 110 mg (05/19/2020) fluorouracil (ADRUCIL) chemo injection 500 mg, 320 mg/m2 = 500 mg (80 % of original dose 400 mg/m2), Intravenous,  Once, 2 of 12 cycles Dose modification: 320 mg/m2 (80 % of original dose 400 mg/m2, Cycle 1, Reason: Provider Judgment), 488.8916 mg/m2 (66.7 % of original dose 400 mg/m2, Cycle 2, Reason: Provider Judgment) Administration: 500 mg (05/19/2020), 450  mg (06/30/2020) fluorouracil (ADRUCIL) 3,150 mg in sodium chloride 0.9 % 87 mL chemo infusion, 1,920 mg/m2 = 3,150 mg (80 % of original dose 2,400 mg/m2), Intravenous, 1 Day/Dose, 2 of 12 cycles Dose modification: 1,920 mg/m2 (80 % of original dose 2,400 mg/m2, Cycle 1, Reason: Provider Judgment), 1,600 mg/m2 (66.7 % of original dose 2,400 mg/m2, Cycle 2, Reason: Provider Judgment) Administration: 3,150 mg (05/19/2020), 2,600 mg (06/30/2020)  for chemotherapy treatment.    08/03/2020 Genetic Testing   [facility] Reveal Testing     08/14/2020 Genetic Testing   No pathogenic variants detected in [facility] Common Hereditary Cancers Panel.  Variant of uncertain significance (VUS) detected in HOXB13 at c.634G>A (p.Ala212Thr). The Common Hereditary Cancers Panel offered by [facility] includes sequencing and/or deletion duplication testing of the following 48 genes: APC, ATM, AXIN2, BARD1, BMPR1A, BRCA1, BRCA2, BRIP1, CDH1, CDK4, CDKN2A (p14ARF), CDKN2A (p16INK4a), CHEK2, CTNNA1, DICER1, EPCAM (Deletion/duplication testing only), GREM1 (promoter region deletion/duplication testing only), KIT, MEN1, MLH1, MSH2, MSH3, MSH6, MUTYH, NBN, NF1, NHTL1, PALB2, PDGFRA, PMS2, POLD1, POLE, PTEN, RAD50, RAD51C, RAD51D, RNF43, SDHB, SDHC, SDHD, SMAD4, SMARCA4. STK11, TP53, TSC1, TSC2, and VHL.  The following genes were evaluated for sequence changes only: SDHA and HOXB13 c.251G>A variant only. The report date is August 14, 2020.    11/05/2020 Cancer Staging   Staging form: Colon and Rectum, AJCC 8th Edition - Pathologic stage from 11/05/2020: Stage IVC (rpTX, pN0, pM1c) - Signed by Derek Jack, MD on 11/05/2020   11/10/2020 -  Chemotherapy    Patient is on Treatment Plan: COLORECTAL PEMBROLIZUMAB Q21D  CANCER STAGING: Cancer Staging Malignant neoplasm of colon [facility]) Staging form: Colon and Rectum, AJCC 8th Edition - Clinical stage from 04/27/2020: Stage IIIB (cT4a, cN1a, cM0) - Unsigned - Pathologic stage  from 11/05/2020: Stage IVC (rpTX, pN0, pM1c) - Signed by Derek Jack, MD on 11/05/2020   INTERVAL HISTORY:  [name], a 75 y.o. female, returns for routine follow-up and consideration for next cycle of immunotherapy. Keiera was last seen on 12/23/2020.  Due for cycle #4 of Keytruda today.   Overall, she tells me she has been feeling pretty well. She tolerated the previous treatment well and denies having any rashes or itching. She continues staying physically active and walks daily. She stopped taking Eliquis.  Overall, she feels ready for next cycle of immunotherapy today.    REVIEW OF SYSTEMS:  Review of Systems  Constitutional: Negative for appetite change and fatigue.  Skin: Negative for itching and rash.  All other systems reviewed and are negative.   PAST MEDICAL/SURGICAL HISTORY:  Past Medical History:  Diagnosis Date  . Arthritis   . Cancer ([place])   . Cataract    OU  . Colon cancer ([place])   . Family history of pancreatic cancer 08/21/2020  . Family history of uterine cancer 08/21/2020  . H/O cesarean section   . Hx of tonsillectomy   . Hyperlipidemia   . Hypertension   . Macular degeneration    Exu ARMD OU  . Paroxysmal atrial fibrillation ([place]) 07/05/2020   Past Surgical History:  Procedure Laterality Date  . BIOPSY  04/06/2020   Procedure: BIOPSY;  Surgeon: Rogene Houston, MD;  Location: [facility] ENDO SU[facility];  Service: Endoscopy;;  . CARPAL TUNNEL RELEASE Right   . CATARACT EXTRACTION W/PHACO Left 10/11/2019   Procedure: CATARACT EXTRACTION PHACO AND INTRAOCULAR LENS PLACEMENT (IOC);  Surgeon: Baruch Goldmann, MD;  Location: [facility] ORS;  Service: Ophthalmology;  Laterality: Left;  CDE: 8.56  . CATARACT EXTRACTION W/PHACO Right 10/25/2019   Procedure: CATARACT EXTRACTION PHACO AND INTRAOCULAR LENS PLACEMENT (IOC);  Surgeon: Baruch Goldmann, MD;  Location: [facility] ORS;  Service: Ophthalmology;  Laterality: Right;  CDE: 5.67  . CESAREAN SECTION    . COLONOSCOPY N/A  04/06/2020   Procedure: COLONOSCOPY;  Surgeon: Rogene Houston, MD;  Location: [facility] ENDO SUITE;  Service: Endoscopy;  Laterality: N/A;  . CYSTOSCOPY WITH BIOPSY N/A 04/08/2020   Procedure: CYSTOSCOPY WITH BIOPSY;  Surgeon: Cleon Gustin, MD;  Location: [facility] ORS;  Service: Urology;  Laterality: N/A;  . ESOPHAGOGASTRODUODENOSCOPY N/A 04/05/2020   Procedure: ESOPHAGOGASTRODUODENOSCOPY (EGD);  Surgeon: Rogene Houston, MD;  Location: [facility];  Service: Endoscopy;  Laterality: N/A;  . PARTIAL COLECTOMY N/A 04/08/2020   Procedure: PARTIAL COLECTOMY;  Surgeon: Virl Cagey, MD;  Location: [facility] ORS;  Service: General;  Laterality: N/A;  . PORTACATH PLACEMENT Left 05/18/2020   Procedure: INSERTION PORT-A-CATH (ATTACHED CATHETER IN LEFT SUBCLAVIAN);  Surgeon: Virl Cagey, MD;  Location: [facility] ORS;  Service: General;  Laterality: Left;  . TONSILLECTOMY      SOCIAL HISTORY:  Social History   Socioeconomic History  . Marital status: Divorced    Spouse name: Not on file  . Number of children: Not on file  . Years of education: Not on file  . Highest education level: Not on file  Occupational History  . Not on file  Tobacco Use  . Smoking status: Never Smoker  . Smokeless tobacco: Never Used  Vaping Use  . Vaping Use: Never used  Substance and Sexual Activity  . Alcohol use: No  . Drug use: No  . Sexual activity: Not Currently  Other Topics Concern  . Not on file  Social History Narrative  . Not on file   Social Determinants of Health   Financial Resource Strain: Medium Risk  . Difficulty of Paying Living Expenses: Somewhat hard  Food Insecurity: Food Insecurity Present  . Worried About Charity fundraiser in the Last Year: Sometimes true  . Ran Out of Food in the Last Year: Never true  Transportation Needs: Unmet Transportation Needs  . Lack of Transportation (Medical): Yes  . Lack of Transportation (Non-Medical): Yes  Physical Activity: Insufficiently Active  . Days of  Exercise per Week: 7 days  . Minutes of Exercise per Session: 20 min  Stress: Stress Concern Present  . Feeling of Stress : Very much  Social Connections: Moderately Integrated  . Frequency of Communication with Friends and Family: More than three times a week  . Frequency of Social Gatherings with Friends and Family: Once a week  . Attends Religious Services: More than 4 times per year  . Active Member of Clubs or Organizations: Yes  . Attends Archivist Meetings: Never  . Marital Status: Divorced  Human resources officer Violence: Not At Risk  . Fear of Current or Ex-Partner: No  . Emotionally Abused: No  . Physically Abused: No  . Sexually Abused: No    FAMILY HISTORY:  Family History  Problem Relation Age of Onset  . Macular degeneration Mother   . Stroke Mother   . Hypertension Mother   . Dementia Mother   . Lung cancer Father        dx late 18s; smoking hx  . Diabetes Sister   . Hypertension Sister   . Leukemia Paternal Uncle        d. 69s  . Cancer Maternal Aunt        ovarian or endometrial dx 76s  . Pancreatic cancer Paternal Uncle        d. late 74s    CURRENT MEDICATIONS:  Current Outpatient Medications  Medication Sig Dispense Refill  . alendronate (FOSAMAX) 70 MG tablet Take 1 tablet (70 mg total) by mouth every 7 (seven) days. Take with a full glass of water on an empty stomach. 4 tablet 11  . amLODipine (NORVASC) 5 MG tablet Take 1 tablet (5 mg total) by mouth daily. 90 tablet 3  . atorvastatin (LIPITOR) 20 MG tablet Take 1 tablet (20 mg total) by mouth daily. 90 tablet 3  . cholecalciferol (VITAMIN D3) 25 MCG (1000 UT) tablet Take 1,000 Units by mouth daily.    Marland Kitchen CRANBERRY FRUIT PO Take 1 tablet by mouth daily.    . diclofenac Sodium (VOLTAREN) 1 % GEL Apply 2 g topically 4 (four) times daily.     . dorzolamide-timolol (COSOPT) 22.3-6.8 MG/ML ophthalmic solution PLACE 1 DROP INTO BOTH EYES 2 TIMES A DAY (Patient taking differently: Place 1 drop into  both eyes 1 day or 1 dose.) 10 mL 0  . ELIQUIS 5 MG TABS tablet Take 5 mg by mouth 2 (two) times daily.    . FEROSUL 325 (65 Fe) MG tablet TAKE  (1)  TABLET TWICE A DAY WITH MEALS (BREAKFAST AND SUPPER) 180 tablet 0  . Garlic 092 MG TABS Take 100 mg by mouth daily.    Marland Kitchen LEUCOVORIN CALCIUM IV Inject into the vein every 14 (fourteen) days.     Marland Kitchen lidocaine-prilocaine (  EMLA) cream Apply a small amount to port a cath site and cover with plastic wrap 1 hour prior to chemotherapy appointments 30 g 0  . losartan-hydrochlorothiazide (HYZAAR) 100-25 MG tablet Take 1 tablet by mouth daily.    . OXALIPLATIN IV Inject into the vein every 14 (fourteen) days.     . potassium chloride SA (KLOR-CON) 20 MEQ tablet Take 1 tablet (20 mEq total) by mouth daily. 90 tablet 1  . prochlorperazine (COMPAZINE) 10 MG tablet Take 1 tablet (10 mg total) by mouth every 6 (six) hours as needed for nausea or vomiting. 30 tablet 0  . saccharomyces boulardii (FLORASTOR) 250 MG capsule Take by mouth.      No current facility-administered medications for this visit.    ALLERGIES:  Allergies  Allergen Reactions  . Lisinopril Cough    PHYSICAL EXAM:  Performance status (ECOG): 1 - Symptomatic but completely ambulatory  Vitals:   01/18/21 1031  BP: (!) 149/64  Pulse: 69  Resp: 18  Temp: (!) 96.8 F (36 C)  SpO2: 100%   Wt Readings from Last 3 Encounters:  01/18/21 148 lb 5.9 oz (67.3 kg)  12/23/20 147 lb 6.4 oz (66.9 kg)  12/01/20 141 lb 5 oz (64.1 kg)   Physical Exam Vitals reviewed.  Constitutional:      Appearance: Normal appearance.  Cardiovascular:     Rate and Rhythm: Normal rate and regular rhythm.     Pulses: Normal pulses.     Heart sounds: Normal heart sounds.  Pulmonary:     Effort: Pulmonary effort is normal.     Breath sounds: Normal breath sounds.  Chest:  Breasts:     Right: No supraclavicular adenopathy.     Left: No supraclavicular adenopathy.      Comments: Port-a-Cath in L  chest Abdominal:     Palpations: Abdomen is soft. There is no hepatomegaly or mass.     Tenderness: There is no abdominal tenderness.     Hernia: No hernia is present.  Musculoskeletal:     Right lower leg: No edema.     Left lower leg: No edema.  Lymphadenopathy:     Cervical: No cervical adenopathy.     Upper Body:     Right upper body: No supraclavicular adenopathy.     Left upper body: No supraclavicular adenopathy.  Neurological:     General: No focal deficit present.     Mental Status: She is alert and oriented to person, place, and time.  Psychiatric:        Mood and Affect: Mood normal.        Behavior: Behavior normal.     LABORATORY DATA:  I have reviewed the labs as listed.  CBC Latest Ref Rng & Units 01/18/2021 12/23/2020 12/01/2020  WBC 4.0 - 10.5 K/uL 4.8 5.3 6.1  Hemoglobin 12.0 - 15.0 g/dL 12.6 12.2 12.2  Hematocrit 36.0 - 46.0 % 37.9 37.5 38.1  Platelets 150 - 400 K/uL 130(L) 127(L) 122(L)   CMP Latest Ref Rng & Units 01/18/2021 12/23/2020 12/01/2020  Glucose 70 - 99 mg/dL 100(H) 92 95  BUN 8 - 23 mg/dL 31(H) 31(H) 30(H)  Creatinine 0.44 - 1.00 mg/dL 1.03(H) 0.97 0.91  Sodium 135 - 145 mmol/L 136 137 138  Potassium 3.5 - 5.1 mmol/L 3.8 3.3(L) 4.2  Chloride 98 - 111 mmol/L 99 98 101  CO2 22 - 32 mmol/L '29 28 30  ' Calcium 8.9 - 10.3 mg/dL 9.7 9.7 9.6  Total Protein 6.5 - 8.1  g/dL 7.8 7.5 7.7  Total Bilirubin 0.3 - 1.2 mg/dL 0.8 0.4 0.5  Alkaline Phos 38 - 126 U/L 59 58 65  AST 15 - 41 U/L '19 20 17  ' ALT 0 - 44 U/L '13 15 10   ' Lab Results  Component Value Date   CEA1 5.9 (H) 11/05/2020   CEA1 4.5 08/03/2020   CEA1 1.7 05/13/2020    DIAGNOSTIC IMAGING:  I have independently reviewed the scans and discussed with the patient. Intravitreal Injection, Pharmacologic Agent - OS - Left Eye  Result Date: 01/14/2021 Time Out 01/14/2021. 8:58 AM. Confirmed correct patient, procedure, site, and patient consented. Anesthesia Topical anesthesia was used. Anesthetic  medications included Lidocaine 2%, Proparacaine 0.5%. Procedure Preparation included 5% betadine to ocular surface, eyelid speculum. A (33g) needle was used. Injection: 2 mg aflibercept Alfonse Flavors) SOLN   NDC: [id number], Lot: [id number], Expiration date: 04/04/2021   Route: Intravitreal, Site: Left Eye, Waste: 0.05 mL Post-op Post injection exam found visual acuity of at least counting fingers. The patient tolerated the procedure well. There were no complications. The patient received written and verbal post procedure care education. Post injection medications were not given.   OCT, Retina - OU - Both Eyes  Result Date: 01/14/2021 Right Eye Quality was good. Central Foveal Thickness: 491. Progression has been stable. Findings include pigment epithelial detachment, epiretinal membrane, subretinal hyper-reflective material, retinal drusen , abnormal foveal contour, no SRF, intraretinal fluid, disciform scar, outer retinal tubulation, outer retinal atrophy (Stable sub-retinal scar, mild persistent cystic changes overlying). Left Eye Quality was good. Central Foveal Thickness: 298. Progression has improved. Findings include subretinal hyper-reflective material, pigment epithelial detachment, intraretinal fluid, retinal drusen , abnormal foveal contour, outer retinal atrophy, subretinal fluid (stable improvement in central SRHM and mild interval improvement in overlying IRF/cystic changes). Notes *Images captured and stored on drive Diagnosis / Impression: Exudative ARMD OU OD: Stable sub-retinal scar, mild persistent cystic changes overlying OS: stable improvement in central SRHM and mild interval improvement in overlying IRF/cystic changes Clinical management: See below Abbreviations: NFP - Normal foveal profile. CME - cystoid macular edema. PED - pigment epithelial detachment. IRF - intraretinal fluid. SRF - subretinal fluid. EZ - ellipsoid zone. ERM - epiretinal membrane. ORA - outer retinal atrophy. ORT - outer  retinal tubulation. SRHM - subretinal hyper-reflective material     ASSESSMENT:  1. Stage IIIb (T4AN1A) poorly differentiated right colon adenocarcinoma: -Right hemicolectomy on 04/07/2020 with poorly differentiated adenocarcinoma, pT4a, positive radial margin, 1/15 lymph nodes positive, loss of MLH1 and PMS2, bladder biopsy negative for malignancy. -CTAP on 04/06/2020 showed circumferential ascending colon mass with numerous borderline enlarged pericolonic lymph nodes. No findings of hepatic metastatic disease. -CEA on 04/04/2020 was 12.2. CEA improved to 1.7 on 05/13/2020. -PET scan on 05/11/2020 shows mild FDG uptake associated with the anastomotic site. Small pulmonary nodules that do not show elevated FDG activity, remain nonspecific, subcentimeter. Persistent but improved bladder wall thickening. -Cycle 1 of dose reduced FOLFOX on 05/19/2020, followed by 2 hospitalizations, 1 from acute kidney injury from diarrhea and second admission for C. difficile colitis. -Cycle 2 of 5-FU and leucovorin dose reduced on 06/30/2020.Chemotherapy discontinued secondary to intolerance. -Follow-up CT scan on 11/02/2020 with multiple soft tissue lesions in the central and right mesentery measuring up to 3.1 x 1.7 cm. Mild lymphadenopathy in the hepatic duodenal ligament, retroperitoneal space, right common iliac chain, bilateral external iliac chains. 17 mm soft tissue nodule in the lower anterior abdominal wall. Bilateral tiny pulmonary nodules not substantially changed. 2.9  x 2.5 cm low-density lesion along the posterior uterus is indeterminate. -Pembrolizumab started on 11/10/2020.  2. Family history: -Paternal uncle had colon cancer. Maternal aunt had brain cancer and another maternal aunt had gynecological malignancy. Father had lung cancer and was a smoker. - Genetic testing did not reveal any notable mutations.  3. Diffuse erythema and nodularity of the dome of the bladder: -CT scan showed severely  thickened bladder wall. -Cystoscopy on 04/08/2020 showed diffuse erythema, nodularity in the posterior wall tracking to the dome. Ureteral orifices were in normal locations. Biopsies were benign.   PLAN:  1.Stage IV right colon adenocarcinoma, MSI-high: -She denies any immunotherapy related side effects. -She has about 2 loose stools per day which is stable. -Reviewed her labs which showed normal LFTs.  TSH was 2.296.  Mild thrombocytopenia with platelet count of 130.  Creatinine slightly increased to 1.03. -Proceed with cycle 4 of treatment today.  RTC 3 weeks.  I plan to repeat CT CAP and CEA level prior to next visit. -She is having a watchman procedure done [facility].  2.  Paroxysmal atrial fibrillation: -Continue Eliquis.  No bleeding issues reported.  3.  Hypertension: - Continue Hyzaar and amlodipine.   Orders placed this encounter:  No orders of the defined types were placed in this encounter.    Derek Jack, MD [place] [phone]   I, Milinda Antis, am acting as a scribe for Dr. Sanda Linger.  I, Derek Jack MD, have reviewed the above documentation for accuracy and completeness, and I agree with the above.

## 2021-01-19 LAB — CEA: CEA: 1.6 ng/mL (ref 0.0–4.7)

## 2021-01-21 ENCOUNTER — Telehealth: Payer: Self-pay | Admitting: Genetic Counselor

## 2021-01-21 ENCOUNTER — Other Ambulatory Visit (HOSPITAL_COMMUNITY): Payer: Medicare HMO

## 2021-01-21 DIAGNOSIS — M6281 Muscle weakness (generalized): Secondary | ICD-10-CM | POA: Diagnosis not present

## 2021-01-21 DIAGNOSIS — M48061 Spinal stenosis, lumbar region without neurogenic claudication: Secondary | ICD-10-CM | POA: Diagnosis not present

## 2021-01-21 DIAGNOSIS — I69898 Other sequelae of other cerebrovascular disease: Secondary | ICD-10-CM | POA: Diagnosis not present

## 2021-01-21 DIAGNOSIS — Z85038 Personal history of other malignant neoplasm of large intestine: Secondary | ICD-10-CM | POA: Diagnosis not present

## 2021-01-21 NOTE — Telephone Encounter (Signed)
Spoke with Ms. Janice and her daughter about FLCN somatic mutation that was detected previously in [facility] testing.  Discussed implications of germline testing for this mutation.  Ms. Rubano provided informed consent to proceed with a no charge re-requisition of sequencing and deletion/duplicaiton analysis of FLCN given the somatic mutation detected previously.

## 2021-01-25 ENCOUNTER — Telehealth: Payer: Self-pay | Admitting: Genetic Counselor

## 2021-01-25 NOTE — Telephone Encounter (Signed)
Contacted patient in attempt to disclose results of FLCN genetic testing.  LVM with contact information requesting a call back.

## 2021-01-29 ENCOUNTER — Ambulatory Visit (HOSPITAL_COMMUNITY): Payer: Medicare HMO

## 2021-02-02 ENCOUNTER — Ambulatory Visit (HOSPITAL_COMMUNITY): Payer: Medicare HMO | Admitting: Hematology

## 2021-02-08 DIAGNOSIS — H5789 Other specified disorders of eye and adnexa: Secondary | ICD-10-CM | POA: Diagnosis not present

## 2021-02-08 DIAGNOSIS — R17 Unspecified jaundice: Secondary | ICD-10-CM | POA: Diagnosis not present

## 2021-02-09 ENCOUNTER — Ambulatory Visit (HOSPITAL_COMMUNITY)
Admission: RE | Admit: 2021-02-09 | Discharge: 2021-02-09 | Disposition: A | Discharge status: Home / Self Care | Payer: Medicare HMO | Source: Ambulatory Visit | Attending: Hematology | Admitting: Hematology

## 2021-02-09 ENCOUNTER — Other Ambulatory Visit: Payer: Self-pay

## 2021-02-09 ENCOUNTER — Inpatient Hospital Stay (HOSPITAL_COMMUNITY): Payer: Medicare HMO | Attending: Hematology

## 2021-02-09 DIAGNOSIS — C189 Malignant neoplasm of colon, unspecified: Secondary | ICD-10-CM | POA: Diagnosis not present

## 2021-02-09 DIAGNOSIS — N281 Cyst of kidney, acquired: Secondary | ICD-10-CM | POA: Diagnosis not present

## 2021-02-09 DIAGNOSIS — N3289 Other specified disorders of bladder: Secondary | ICD-10-CM | POA: Diagnosis not present

## 2021-02-09 DIAGNOSIS — Z7901 Long term (current) use of anticoagulants: Secondary | ICD-10-CM | POA: Insufficient documentation

## 2021-02-09 DIAGNOSIS — C182 Malignant neoplasm of ascending colon: Secondary | ICD-10-CM | POA: Insufficient documentation

## 2021-02-09 DIAGNOSIS — K7689 Other specified diseases of liver: Secondary | ICD-10-CM | POA: Diagnosis not present

## 2021-02-09 DIAGNOSIS — I480 Paroxysmal atrial fibrillation: Secondary | ICD-10-CM | POA: Diagnosis not present

## 2021-02-09 DIAGNOSIS — I10 Essential (primary) hypertension: Secondary | ICD-10-CM | POA: Diagnosis not present

## 2021-02-09 DIAGNOSIS — M47816 Spondylosis without myelopathy or radiculopathy, lumbar region: Secondary | ICD-10-CM | POA: Diagnosis not present

## 2021-02-09 DIAGNOSIS — I700 Atherosclerosis of aorta: Secondary | ICD-10-CM | POA: Diagnosis not present

## 2021-02-09 DIAGNOSIS — I708 Atherosclerosis of other arteries: Secondary | ICD-10-CM | POA: Diagnosis not present

## 2021-02-09 DIAGNOSIS — I2510 Atherosclerotic heart disease of native coronary artery without angina pectoris: Secondary | ICD-10-CM | POA: Diagnosis not present

## 2021-02-09 LAB — CBC WITH DIFFERENTIAL/PLATELET
Abs Immature Granulocytes: 0.02 10*3/uL (ref 0.00–0.07)
Basophils Absolute: 0 10*3/uL (ref 0.0–0.1)
Basophils Relative: 1 %
Eosinophils Absolute: 0.2 10*3/uL (ref 0.0–0.5)
Eosinophils Relative: 5 %
HCT: 38.7 % (ref 36.0–46.0)
Hemoglobin: 12.6 g/dL (ref 12.0–15.0)
Immature Granulocytes: 0 %
Lymphocytes Relative: 26 %
Lymphs Abs: 1.2 10*3/uL (ref 0.7–4.0)
MCH: 31 pg (ref 26.0–34.0)
MCHC: 32.6 g/dL (ref 30.0–36.0)
MCV: 95.1 fL (ref 80.0–100.0)
Monocytes Absolute: 0.4 10*3/uL (ref 0.1–1.0)
Monocytes Relative: 9 %
Neutro Abs: 2.8 10*3/uL (ref 1.7–7.7)
Neutrophils Relative %: 59 %
Platelets: 138 10*3/uL — ABNORMAL LOW (ref 150–400)
RBC: 4.07 MIL/uL (ref 3.87–5.11)
RDW: 14 % (ref 11.5–15.5)
WBC: 4.7 10*3/uL (ref 4.0–10.5)
nRBC: 0 % (ref 0.0–0.2)

## 2021-02-09 LAB — COMPREHENSIVE METABOLIC PANEL
ALT: 14 U/L (ref 0–44)
AST: 21 U/L (ref 15–41)
Albumin: 4.3 g/dL (ref 3.5–5.0)
Alkaline Phosphatase: 57 U/L (ref 38–126)
Anion gap: 11 (ref 5–15)
BUN: 24 mg/dL — ABNORMAL HIGH (ref 8–23)
CO2: 29 mmol/L (ref 22–32)
Calcium: 9.9 mg/dL (ref 8.9–10.3)
Chloride: 98 mmol/L (ref 98–111)
Creatinine, Ser: 1.09 mg/dL — ABNORMAL HIGH (ref 0.44–1.00)
GFR, Estimated: 53 mL/min — ABNORMAL LOW (ref >60)
Glucose, Bld: 102 mg/dL — ABNORMAL HIGH (ref 70–99)
Potassium: 3.2 mmol/L — ABNORMAL LOW (ref 3.5–5.1)
Sodium: 138 mmol/L (ref 135–145)
Total Bilirubin: 0.6 mg/dL (ref 0.3–1.2)
Total Protein: 7.8 g/dL (ref 6.5–8.1)

## 2021-02-09 LAB — MAGNESIUM: Magnesium: 2.1 mg/dL (ref 1.7–2.4)

## 2021-02-09 MED ORDER — IOHEXOL 300 MG/ML  SOLN
100.0000 mL | Freq: Once | INTRAMUSCULAR | Status: AC | PRN
  Administered 2021-02-09: 100 mL via INTRAVENOUS

## 2021-02-10 ENCOUNTER — Inpatient Hospital Stay (HOSPITAL_BASED_OUTPATIENT_CLINIC_OR_DEPARTMENT_OTHER): Payer: Medicare HMO | Admitting: Hematology

## 2021-02-10 ENCOUNTER — Inpatient Hospital Stay (HOSPITAL_COMMUNITY): Payer: Medicare HMO

## 2021-02-10 VITALS — BP 154/55 | HR 72 | Temp 96.7°F | Resp 18 | Wt 153.2 lb

## 2021-02-10 DIAGNOSIS — I480 Paroxysmal atrial fibrillation: Secondary | ICD-10-CM | POA: Diagnosis not present

## 2021-02-10 DIAGNOSIS — C182 Malignant neoplasm of ascending colon: Secondary | ICD-10-CM | POA: Diagnosis not present

## 2021-02-10 DIAGNOSIS — Z7901 Long term (current) use of anticoagulants: Secondary | ICD-10-CM | POA: Diagnosis not present

## 2021-02-10 DIAGNOSIS — I10 Essential (primary) hypertension: Secondary | ICD-10-CM | POA: Diagnosis not present

## 2021-02-10 NOTE — Progress Notes (Signed)
Gabrielle [name], Gabrielle [name] [id number]   CLINIC:  Medical Oncology/Hematology  PCP:  Janora Norlander, DO [address] [place] [address] [phone]   REASON FOR VISIT:  Follow-up for right colon cancer  PRIOR THERAPY:  1. Right hemicolectomy on 04/08/2020. 2. FOLFOX x 3 cycles from 05/19/2020 to 07/14/2020.  NGS Results: Foundation 1 KRAS/NRAS wild-type, MSI--high, TMB 55 Muts/Mb, BRAF V 600 E  CURRENT THERAPY: Keytruda every 3 weeks  BRIEF ONCOLOGIC HISTORY:  Oncology History  Malignant neoplasm of colon ([place])  04/07/2020 Initial Diagnosis   Malignant neoplasm of ascending colon ([place])   05/13/2020 Genetic Testing   Foundation One     05/19/2020 - 07/02/2020 Chemotherapy   The patient had palonosetron (ALOXI) injection 0.25 mg, 0.25 mg, Intravenous,  Once, 2 of 12 cycles Administration: 0.25 mg (05/19/2020), 0.25 mg (06/30/2020) leucovorin 522 mg in dextrose 5 % 250 mL infusion, 320 mg/m2 = 522 mg (80 % of original dose 400 mg/m2), Intravenous,  Once, 2 of 12 cycles Dose modification: 320 mg/m2 (80 % of original dose 400 mg/m2, Cycle 1, Reason: Provider Judgment), 751.0258 mg/m2 (66.7 % of original dose 400 mg/m2, Cycle 2, Reason: Provider Judgment) Administration: 522 mg (05/19/2020), 434 mg (06/30/2020) oxaliplatin (ELOXATIN) 110 mg in dextrose 5 % 500 mL chemo infusion, 68 mg/m2 = 110 mg (80 % of original dose 85 mg/m2), Intravenous,  Once, 1 of 1 cycle Dose modification: 68 mg/m2 (80 % of original dose 85 mg/m2, Cycle 1, Reason: Provider Judgment) Administration: 110 mg (05/19/2020) fluorouracil (ADRUCIL) chemo injection 500 mg, 320 mg/m2 = 500 mg (80 % of original dose 400 mg/m2), Intravenous,  Once, 2 of 12 cycles Dose modification: 320 mg/m2 (80 % of original dose 400 mg/m2, Cycle 1, Reason: Provider Judgment), 527.7824 mg/m2 (66.7 % of original dose 400 mg/m2, Cycle 2, Reason: Provider Judgment) Administration: 500 mg (05/19/2020), 450  mg (06/30/2020) fluorouracil (ADRUCIL) 3,150 mg in sodium chloride 0.9 % 87 mL chemo infusion, 1,920 mg/m2 = 3,150 mg (80 % of original dose 2,400 mg/m2), Intravenous, 1 Day/Dose, 2 of 12 cycles Dose modification: 1,920 mg/m2 (80 % of original dose 2,400 mg/m2, Cycle 1, Reason: Provider Judgment), 1,600 mg/m2 (66.7 % of original dose 2,400 mg/m2, Cycle 2, Reason: Provider Judgment) Administration: 3,150 mg (05/19/2020), 2,600 mg (06/30/2020)  for chemotherapy treatment.    08/03/2020 Genetic Testing   Guardant Reveal Testing     08/14/2020 Genetic Testing   No pathogenic variants detected in [facility] Common Hereditary Cancers Panel.  Variant of uncertain significance (VUS) detected in HOXB13 at c.634G>A (p.Ala212Thr). The Common Hereditary Cancers Panel offered by [facility] includes sequencing and/or deletion duplication testing of the following 48 genes: APC, ATM, AXIN2, BARD1, BMPR1A, BRCA1, BRCA2, BRIP1, CDH1, CDK4, CDKN2A (p14ARF), CDKN2A (p16INK4a), CHEK2, CTNNA1, DICER1, EPCAM (Deletion/duplication testing only), FLCN, GREM1 (promoter region deletion/duplication testing only), KIT, MEN1, MLH1, MSH2, MSH3, MSH6, MUTYH, NBN, NF1, NHTL1, PALB2, PDGFRA, PMS2, POLD1, POLE, PTEN, RAD50, RAD51C, RAD51D, RNF43, SDHB, SDHC, SDHD, SMAD4, SMARCA4. STK11, TP53, TSC1, TSC2, and VHL.  The following genes were evaluated for sequence changes only: SDHA and HOXB13 c.251G>A variant only. The report date is August 14, 2020.    11/05/2020 Cancer Staging   Staging form: Colon and Rectum, AJCC 8th Edition - Pathologic stage from 11/05/2020: Stage IVC (rpTX, pN0, pM1c) - Signed by Derek Jack, MD on 11/05/2020   11/10/2020 -  Chemotherapy    Patient is on Treatment Plan: COLORECTAL PEMBROLIZUMAB Q21D  CANCER STAGING: Cancer Staging Malignant neoplasm of colon [facility]) Staging form: Colon and Rectum, AJCC 8th Edition - Clinical stage from 04/27/2020: Stage IIIB (cT4a, cN1a, cM0) - Unsigned - Pathologic  stage from 11/05/2020: Stage IVC (rpTX, pN0, pM1c) - Signed by Derek Jack, MD on 11/05/2020   INTERVAL HISTORY:  Gabrielle [name], a 75 y.o. female, returns for routine follow-up and consideration for next cycle of immunotherapy. Gabrielle [name] was last seen on 01/18/2021.  Due for cycle #5 of Keytruda today.   Overall, she tells me she has been feeling pretty well. She notes that last week her eyes were very red due to sensation of sandpaper under her right eyelid and her vision was blurry; she has never had such an episode previously. Her vision is more cloudy since last week but denies itching. She will go to ophthalmologist on 03/24. She has a history of stroke in one eye and is getting injections in her eyes. She sometimes has trouble seeing light, though denies having that problem right now. She continues taking Eliquis BID and her surgery is scheduled for April. She tolerated her previous treatment well and denies having N/V/D. Her appetite is excellent.  She will hold treatment today until she sees her ophthalmologist.    REVIEW OF SYSTEMS:  Review of Systems  Constitutional: Positive for fatigue (90%). Negative for appetite change.  Eyes: Positive for eye problems (worsening cloudiness since last week).  Gastrointestinal: Negative for diarrhea, nausea and vomiting.  All other systems reviewed and are negative.   PAST MEDICAL/SURGICAL HISTORY:  Past Medical History:  Diagnosis Date  . Arthritis   . Cancer ([place])   . Cataract    OU  . Colon cancer ([place])   . Family history of pancreatic cancer 08/21/2020  . Family history of uterine cancer 08/21/2020  . H/O cesarean section   . Hx of tonsillectomy   . Hyperlipidemia   . Hypertension   . Macular degeneration    Exu ARMD OU  . Paroxysmal atrial fibrillation ([place]) 07/05/2020   Past Surgical History:  Procedure Laterality Date  . BIOPSY  04/06/2020   Procedure: BIOPSY;  Surgeon: Rogene Houston, MD;  Location: [facility];  Service: Endoscopy;;  . CARPAL TUNNEL RELEASE Right   . CATARACT EXTRACTION W/PHACO Left 10/11/2019   Procedure: CATARACT EXTRACTION PHACO AND INTRAOCULAR LENS PLACEMENT (IOC);  Surgeon: Baruch Goldmann, MD;  Location: [facility] ORS;  Service: Ophthalmology;  Laterality: Left;  CDE: 8.56  . CATARACT EXTRACTION W/PHACO Right 10/25/2019   Procedure: CATARACT EXTRACTION PHACO AND INTRAOCULAR LENS PLACEMENT (IOC);  Surgeon: Baruch Goldmann, MD;  Location: [facility] ORS;  Service: Ophthalmology;  Laterality: Right;  CDE: 5.67  . CESAREAN SECTION    . COLONOSCOPY N/A 04/06/2020   Procedure: COLONOSCOPY;  Surgeon: Rogene Houston, MD;  Location: [facility] ENDO SUITE;  Service: Endoscopy;  Laterality: N/A;  . CYSTOSCOPY WITH BIOPSY N/A 04/08/2020   Procedure: CYSTOSCOPY WITH BIOPSY;  Surgeon: Cleon Gustin, MD;  Location: [facility] ORS;  Service: Urology;  Laterality: N/A;  . ESOPHAGOGASTRODUODENOSCOPY N/A 04/05/2020   Procedure: ESOPHAGOGASTRODUODENOSCOPY (EGD);  Surgeon: Rogene Houston, MD;  Location: [facility];  Service: Endoscopy;  Laterality: N/A;  . PARTIAL COLECTOMY N/A 04/08/2020   Procedure: PARTIAL COLECTOMY;  Surgeon: Virl Cagey, MD;  Location: [facility] ORS;  Service: General;  Laterality: N/A;  . PORTACATH PLACEMENT Left 05/18/2020   Procedure: INSERTION PORT-A-CATH (ATTACHED CATHETER IN LEFT SUBCLAVIAN);  Surgeon: Virl Cagey, MD;  Location: [facility]  ORS;  Service: General;  Laterality: Left;  . TONSILLECTOMY      SOCIAL HISTORY:  Social History   Socioeconomic History  . Marital status: Divorced    Spouse name: Not on file  . Number of children: Not on file  . Years of education: Not on file  . Highest education level: Not on file  Occupational History  . Not on file  Tobacco Use  . Smoking status: Never Smoker  . Smokeless tobacco: Never Used  Vaping Use  . Vaping Use: Never used  Substance and Sexual Activity  . Alcohol use: No  . Drug use: No  . Sexual activity: Not Currently  Other  Topics Concern  . Not on file  Social History Narrative  . Not on file   Social Determinants of Health   Financial Resource Strain: Medium Risk  . Difficulty of Paying Living Expenses: Somewhat hard  Food Insecurity: Food Insecurity Present  . Worried About Charity fundraiser in the Last Year: Sometimes true  . Ran Out of Food in the Last Year: Never true  Transportation Needs: Unmet Transportation Needs  . Lack of Transportation (Medical): Yes  . Lack of Transportation (Non-Medical): Yes  Physical Activity: Insufficiently Active  . Days of Exercise per Week: 7 days  . Minutes of Exercise per Session: 20 min  Stress: Stress Concern Present  . Feeling of Stress : Very much  Social Connections: Moderately Integrated  . Frequency of Communication with Friends and Family: More than three times a week  . Frequency of Social Gatherings with Friends and Family: Once a week  . Attends Religious Services: More than 4 times per year  . Active Member of Clubs or Organizations: Yes  . Attends Archivist Meetings: Never  . Marital Status: Divorced  Human resources officer Violence: Not At Risk  . Fear of Current or Ex-Partner: No  . Emotionally Abused: No  . Physically Abused: No  . Sexually Abused: No    FAMILY HISTORY:  Family History  Problem Relation Age of Onset  . Macular degeneration Mother   . Stroke Mother   . Hypertension Mother   . Dementia Mother   . Lung cancer Father        dx late 21s; smoking hx  . Diabetes Sister   . Hypertension Sister   . Leukemia Paternal Uncle        d. 67s  . Cancer Maternal Aunt        ovarian or endometrial dx 24s  . Pancreatic cancer Paternal Uncle        d. late 25s    CURRENT MEDICATIONS:  Current Outpatient Medications  Medication Sig Dispense Refill  . alendronate (FOSAMAX) 70 MG tablet Take 1 tablet (70 mg total) by mouth every 7 (seven) days. Take with a full glass of water on an empty stomach. 4 tablet 11  . amLODipine  (NORVASC) 5 MG tablet Take 1 tablet (5 mg total) by mouth daily. 90 tablet 3  . atorvastatin (LIPITOR) 20 MG tablet Take 1 tablet (20 mg total) by mouth daily. 90 tablet 3  . cholecalciferol (VITAMIN D3) 25 MCG (1000 UT) tablet Take 1,000 Units by mouth daily.    Marland Kitchen CRANBERRY FRUIT PO Take 1 tablet by mouth daily.    . diclofenac Sodium (VOLTAREN) 1 % GEL Apply 2 g topically 4 (four) times daily.     . dorzolamide-timolol (COSOPT) 22.3-6.8 MG/ML ophthalmic solution PLACE 1 DROP INTO BOTH EYES 2 TIMES  A DAY (Patient taking differently: Place 1 drop into both eyes 1 day or 1 dose.) 10 mL 0  . ELIQUIS 5 MG TABS tablet Take 5 mg by mouth 2 (two) times daily.    . FEROSUL 325 (65 Fe) MG tablet TAKE  (1)  TABLET TWICE A DAY WITH MEALS (BREAKFAST AND SUPPER) 180 tablet 0  . Garlic 660 MG TABS Take by mouth.    . LEUCOVORIN CALCIUM IV Inject into the vein every 14 (fourteen) days.     Marland Kitchen lidocaine-prilocaine (EMLA) cream Apply a small amount to port a cath site and cover with plastic wrap 1 hour prior to chemotherapy appointments 30 g 0  . losartan-hydrochlorothiazide (HYZAAR) 100-25 MG tablet Take 1 tablet by mouth daily.    . OXALIPLATIN IV Inject into the vein every 14 (fourteen) days.     . potassium chloride SA (KLOR-CON) 20 MEQ tablet Take 1 tablet (20 mEq total) by mouth daily. 90 tablet 1  . prochlorperazine (COMPAZINE) 10 MG tablet Take 1 tablet (10 mg total) by mouth every 6 (six) hours as needed for nausea or vomiting. 30 tablet 0  . saccharomyces boulardii (FLORASTOR) 250 MG capsule Take by mouth.      No current facility-administered medications for this visit.    ALLERGIES:  Allergies  Allergen Reactions  . Lisinopril Cough    PHYSICAL EXAM:  Performance status (ECOG): 1 - Symptomatic but completely ambulatory  Vitals:   02/10/21 1029  BP: (!) 154/55  Pulse: 72  Resp: 18  Temp: (!) 96.7 F (35.9 C)  SpO2: 100%   Wt Readings from Last 3 Encounters:  02/10/21 153 lb 3.2 oz  (69.5 kg)  02/10/21 153 lb 3.2 oz (69.5 kg)  01/18/21 148 lb 5.9 oz (67.3 kg)   Physical Exam Vitals reviewed.  Constitutional:      Appearance: Normal appearance.  Eyes:     General:        Right eye: No discharge.        Left eye: No discharge.     Conjunctiva/sclera:     Right eye: Right conjunctiva is injected.     Left eye: Left conjunctiva is injected.  Chest:     Comments: Port-a-Cath in L chest Neurological:     General: No focal deficit present.     Mental Status: She is alert and oriented to person, place, and time.  Psychiatric:        Mood and Affect: Mood normal.        Behavior: Behavior normal.     LABORATORY DATA:  I have reviewed the labs as listed.  CBC Latest Ref Rng & Units 02/09/2021 01/18/2021 12/23/2020  WBC 4.0 - 10.5 K/uL 4.7 4.8 5.3  Hemoglobin 12.0 - 15.0 g/dL 12.6 12.6 12.2  Hematocrit 36.0 - 46.0 % 38.7 37.9 37.5  Platelets 150 - 400 K/uL 138(L) 130(L) 127(L)   CMP Latest Ref Rng & Units 02/09/2021 01/18/2021 12/23/2020  Glucose 70 - 99 mg/dL 102(H) 100(H) 92  BUN 8 - 23 mg/dL 24(H) 31(H) 31(H)  Creatinine 0.44 - 1.00 mg/dL 1.09(H) 1.03(H) 0.97  Sodium 135 - 145 mmol/L 138 136 137  Potassium 3.5 - 5.1 mmol/L 3.2(L) 3.8 3.3(L)  Chloride 98 - 111 mmol/L 98 99 98  CO2 22 - 32 mmol/L _0 Calcium 8.9 - 10.3 mg/dL 9.9 9.7 9.7  Total Protein 6.5 - 8.1 g/dL 7.8 7.8 7.5  Total Bilirubin 0.3 - 1.2 mg/dL 0.6 0.8 0.4  Alkaline Phos 38 - 126 U/L 57 59 58  AST 15 - 41 U/L _0 ALT 0 - 44 U/L _1 DIAGNOSTIC IMAGING:  I have independently reviewed the scans and discussed with the patient. CT CHEST ABDOMEN PELVIS W CONTRAST  Result Date: 02/09/2021 CLINICAL DATA:  Ascending colon cancer post partial colectomy 04/08/2020. Assess treatment response. EXAM: CT CHEST, ABDOMEN, AND PELVIS WITH CONTRAST TECHNIQUE: Multidetector CT imaging of the chest, abdomen and pelvis was performed following the standard protocol during bolus administration of  intravenous contrast. CONTRAST:  135m OMNIPAQUE IOHEXOL 300 MG/ML  SOLN COMPARISON:  CT 11/02/2020 and 06/02/2020.  PET-CT 05/11/2020. FINDINGS: CT CHEST FINDINGS Cardiovascular: Atherosclerosis of the aorta, great vessels and coronary arteries. No acute vascular findings are seen. Left subclavian Port-A-Cath extends into the left brachiocephalic vein, unchanged. Mild cardiomegaly without significant pericardial fluid. Mediastinum/Nodes: There are no enlarged mediastinal, hilar or axillary lymph nodes. The thyroid gland, trachea and esophagus demonstrate no significant findings. Lungs/Pleura: There is no pleural effusion. Scattered small pulmonary nodules are unchanged, largest measuring 7 mm in the right lower lobe (image 81/3) and 6 mm in the left lower lobe (image 84/3). These nodules appears stable from baseline PET-CT. No new or enlarging pulmonary nodules identified. Musculoskeletal/Chest wall: No chest wall mass or suspicious osseous findings. CT ABDOMEN AND PELVIS FINDINGS Hepatobiliary: The liver is normal in density without suspicious focal abnormality. No evidence of gallstones, gallbladder wall thickening or biliary dilatation. Pancreas: Unremarkable. No pancreatic ductal dilatation or surrounding inflammatory changes. Spleen: 10 mm low-density lesion medially in the spleen on image 44/2 has slightly enlarged compared with the prior study, although does not appear to show significant enhancement on the delayed post-contrast images. No other focal splenic abnormality. Adrenals/Urinary Tract: Both adrenal glands appear normal. The kidneys appear stable with low and intermediate density lesions bilaterally, likely simple and mildly complex cysts. No enlarging lesion, urinary tract calculus or hydronephrosis. There is chronic bladder distension and circumferential wall thickening, similar to previous studies. Stomach/Bowel: Enteric contrast was administered and has passed into the descending colon. The  stomach appears normal for its degree of distention. No significant abnormalities the small bowel or colon are seen status post right colectomy. Moderate stool in the distal colon. Vascular/Lymphatic: There are no residual enlarged abdominal or pelvic lymph nodes. A 10 mm portacaval node on image 57/2 is stable. The previously demonstrated nodal masses in the ileocolonic mesentery have largely resolved. There is a residual ill-defined nodule medial to the tip of the right hepatic lobe which measures 3.0 x 2.4 cm on image 67/2 (previously 3.4 x 3.3 cm). Aortic and branch vessel atherosclerosis without acute vascular findings. The portal, superior mesenteric and splenic veins are patent. Reproductive: Previously noted low-density lesion at the uterine fundus is no longer visualized. The uterus and ovaries appear unremarkable. No adnexal mass. Other: As above, improving lymph node or peritoneal implant inferior to the right hepatic lobe. No ascites or other peritoneal nodularity. Intact abdominal wall without residual nodularity inferior to the umbilicus. Musculoskeletal: No acute or significant osseous findings. Lower lumbar spondylosis. IMPRESSION: 1. Interval near-complete resolution of previously demonstrated nodal masses in the ileocolonic mesentery consistent with response to therapy. There is a residual ill-defined nodule medial to the tip of the right hepatic lobe which is likely a treated metastasis. No evidence of disease progression. 2. Stable small pulmonary nodules bilaterally consistent with benign findings based on stability. 3. Slight enlargement of a 10 mm low-density  lesion medially in the spleen, indeterminate in etiology. Attention on follow-up recommended. 4. Chronic bladder distension and circumferential wall thickening, similar to previous studies, suggesting chronic bladder outlet obstruction or neurogenic bladder. 5. Grossly stable cystic lesions in both kidneys. 6. Aortic Atherosclerosis  (ICD10-I70.0). Electronically Signed   By: Richardean Sale M.D.   On: 02/09/2021 16:23   Intravitreal Injection, Pharmacologic Agent - OS - Left Eye  Result Date: 01/14/2021 Time Out 01/14/2021. 8:58 AM. Confirmed correct patient, procedure, site, and patient consented. Anesthesia Topical anesthesia was used. Anesthetic medications included Lidocaine 2%, Proparacaine 0.5%. Procedure Preparation included 5% betadine to ocular surface, eyelid speculum. A (33g) needle was used. Injection: 2 mg aflibercept Alfonse Flavors) SOLN   NDC: [id number], Lot: [id number], Expiration date: 04/04/2021   Route: Intravitreal, Site: Left Eye, Waste: 0.05 mL Post-op Post injection exam found visual acuity of at least counting fingers. The patient tolerated the procedure well. There were no complications. The patient received written and verbal post procedure care education. Post injection medications were not given.   OCT, Retina - OU - Both Eyes  Result Date: 01/14/2021 Right Eye Quality was good. Central Foveal Thickness: 491. Progression has been stable. Findings include pigment epithelial detachment, epiretinal membrane, subretinal hyper-reflective material, retinal drusen , abnormal foveal contour, no SRF, intraretinal fluid, disciform scar, outer retinal tubulation, outer retinal atrophy (Stable sub-retinal scar, mild persistent cystic changes overlying). Left Eye Quality was good. Central Foveal Thickness: 298. Progression has improved. Findings include subretinal hyper-reflective material, pigment epithelial detachment, intraretinal fluid, retinal drusen , abnormal foveal contour, outer retinal atrophy, subretinal fluid (stable improvement in central SRHM and mild interval improvement in overlying IRF/cystic changes). Notes *Images captured and stored on drive Diagnosis / Impression: Exudative ARMD OU OD: Stable sub-retinal scar, mild persistent cystic changes overlying OS: stable improvement in central SRHM and mild interval  improvement in overlying IRF/cystic changes Clinical management: See below Abbreviations: NFP - Normal foveal profile. CME - cystoid macular edema. PED - pigment epithelial detachment. IRF - intraretinal fluid. SRF - subretinal fluid. EZ - ellipsoid zone. ERM - epiretinal membrane. ORA - outer retinal atrophy. ORT - outer retinal tubulation. SRHM - subretinal hyper-reflective material     ASSESSMENT:  1. Stage IIIb (T4AN1A) poorly differentiated right colon adenocarcinoma: -Right hemicolectomy on 04/07/2020 with poorly differentiated adenocarcinoma, pT4a, positive radial margin, 1/15 lymph nodes positive, loss of MLH1 and PMS2, bladder biopsy negative for malignancy. -CTAP on 04/06/2020 showed circumferential ascending colon mass with numerous borderline enlarged pericolonic lymph nodes. No findings of hepatic metastatic disease. -CEA on 04/04/2020 was 12.2. CEA improved to 1.7 on 05/13/2020. -PET scan on 05/11/2020 shows mild FDG uptake associated with the anastomotic site. Small pulmonary nodules that do not show elevated FDG activity, remain nonspecific, subcentimeter. Persistent but improved bladder wall thickening. -Cycle 1 of dose reduced FOLFOX on 05/19/2020, followed by 2 hospitalizations, 1 from acute kidney injury from diarrhea and second admission for C. difficile colitis. -Cycle 2 of 5-FU and leucovorin dose reduced on 06/30/2020.Chemotherapy discontinued secondary to intolerance. -Follow-up CT scan on 11/02/2020 with multiple soft tissue lesions in the central and right mesentery measuring up to 3.1 x 1.7 cm. Mild lymphadenopathy in the hepatic duodenal ligament, retroperitoneal space, right common iliac chain, bilateral external iliac chains. 17 mm soft tissue nodule in the lower anterior abdominal wall. Bilateral tiny pulmonary nodules not substantially changed. 2.9 x 2.5 cm low-density lesion along the posterior uterus is indeterminate. -Pembrolizumab started on 11/10/2020.  2. Family  history: -Paternal uncle had  colon cancer. Maternal aunt had brain cancer and another maternal aunt had gynecological malignancy. Father had lung cancer and was a smoker. -Genetic testing did not reveal any notable mutations.  3. Diffuse erythema and nodularity of the dome of the bladder: -CT scan showed severely thickened bladder wall. -Cystoscopy on 04/08/2020 showed diffuse erythema, nodularity in the posterior wall tracking to the dome. Ureteral orifices were in normal locations. Biopsies were benign.   PLAN:  1.Stage IV right colon adenocarcinoma, MSI-high: -We have reviewed CT CAP from 02/09/2021 which showed interval near complete resolution of previously demonstrated nodal masses in the ileocolonic mesentery consistent with response to therapy.  Residual ill-defined nodule medial to the tip of the right hepatic lobe.  Stable small pulmonary nodules bilaterally consistent with benign findings. -She does not have any immunotherapy related side effects. -I reviewed her labs.  She has low potassium 3.2.  She will take extra dose of potassium at home today.  LFTs are normal.  TSH was 2.2. -She reported reddening of her eyes in the last 1 week.  She is using drops given by her ophthalmologist and reported improvement in the eye.  She has erythema under the eyelids.  She does not report any photophobia although reports cloudy vision.  No itching reported. -Rare incidence of iritis and uveitis have been reported with Keytruda.  Hence I will hold her treatment today. -She will see Dr. Coralyn Pear on 02/25/2021.  I plan to see her back in 3 weeks for follow-up and possible reinitiation of treatment.  2.Paroxysmal atrial fibrillation: -Watchman procedure at [facility] was rescheduled to May. -Continue Eliquis.  No bleeding issues.  3.Hypertension: -Continue Hyzaar and amlodipine.   Orders placed this encounter:  No orders of the defined types were placed in this  encounter.    Derek Jack, MD [place] [phone]   I, Milinda Antis, am acting as a scribe for Dr. Sanda Linger.  I, Derek Jack MD, have reviewed the above documentation for accuracy and completeness, and I agree with the above.

## 2021-02-10 NOTE — Progress Notes (Signed)
Patient presents today for Keytruda infusion.  Vital signs and labs drawn 02/09/21 within parameters for treatment.  Patient has no new complaints since last visit.  No treatment today per Dr. Delton Coombes.  Office visit in 3 weeks

## 2021-02-10 NOTE — Progress Notes (Signed)
Patient was assessed by Dr. Katragadda and labs have been reviewed. No treatment today. Primary RN and pharmacy aware.   

## 2021-02-10 NOTE — Patient Instructions (Signed)
[place] at [facility] Discharge Instructions  You were seen today by Dr. Delton Coombes. He went over your recent results. You did not receive your treatment today. You may proceed with seeing your ophthalmologist. Dr. Delton Coombes will see you back in 3 weeks for labs and follow up.   Thank you for choosing [place] at [facility] to provide your oncology and hematology care.  To afford each patient quality time with our provider, please arrive at least 15 minutes before your scheduled appointment time.   If you have a lab appointment with the [place] [facility] please come in thru the Main Entrance and check in at the main information desk  You need to re-schedule your appointment should you arrive 10 or more minutes late.  We strive to give you quality time with our providers, and arriving late affects you and other patients whose appointments are after yours.  Also, if you no show three or more times for appointments you may be dismissed from the clinic at the providers discretion.     Again, thank you for choosing [facility].  Our hope is that these requests will decrease the amount of time that you wait before being seen by our physicians.       _____________________________________________________________  Should you have questions after your visit to [facility], please contact our office at [phone] between the hours of 8:00 a.m. and 4:30 p.m.  Voicemails left after 4:00 p.m. will not be returned until the following business day.  For prescription refill requests, have your pharmacy contact our office and allow 72 hours.    Cancer Center Support Programs:   > Cancer Support Group  2nd Tuesday of the month 1pm-2pm, Journey Room

## 2021-02-17 NOTE — Telephone Encounter (Signed)
Disclosed results of negative FLCN result to patient's daughter, Ashok Norris.  Discussed that [place] mutation is likely confined to the tumor and unable to be passed down to next generation.  Reviewed previous negative/uncertain results and discussed importance of keeping in contact with genetics.

## 2021-02-18 DIAGNOSIS — M6281 Muscle weakness (generalized): Secondary | ICD-10-CM | POA: Diagnosis not present

## 2021-02-18 DIAGNOSIS — I69898 Other sequelae of other cerebrovascular disease: Secondary | ICD-10-CM | POA: Diagnosis not present

## 2021-02-18 DIAGNOSIS — Z85038 Personal history of other malignant neoplasm of large intestine: Secondary | ICD-10-CM | POA: Diagnosis not present

## 2021-02-18 DIAGNOSIS — M48061 Spinal stenosis, lumbar region without neurogenic claudication: Secondary | ICD-10-CM | POA: Diagnosis not present

## 2021-02-24 NOTE — Progress Notes (Signed)
[facility] Note  02/25/2021     CHIEF COMPLAINT Patient presents for Retina Follow Up   HISTORY OF PRESENT ILLNESS: Gabrielle [name] is a 75 y.o. female who presents to the clinic today for:   HPI    Retina Follow Up    Patient presents with  Wet AMD.  Severity is severe.  Duration of 6 weeks.  Since onset it is gradually worsening.  I, the attending physician,  performed the HPI with the patient and updated documentation appropriately.          Comments    6 week retina follow up for armd. Patient states vision is cloudy and eyes are blood shot and tearing.        Last edited by Bernarda Caffey, MD on 02/26/2021  1:16 AM. (History)    pt states her left eye vision is not as good today, she states her eye is very watering and blood shot   Referring physician: Thalia Bloodgood, OD No address on file  HISTORICAL INFORMATION:   Selected notes from the [place] Referred by Dr. Cristela Blue for concern of ARMD OU   CURRENT MEDICATIONS: Current Outpatient Medications (Ophthalmic Drugs)  Medication Sig  . dorzolamide-timolol (COSOPT) 22.3-6.8 MG/ML ophthalmic solution PLACE 1 DROP INTO BOTH EYES 2 TIMES A DAY (Patient taking differently: Place 1 drop into both eyes 1 day or 1 dose.)   No current facility-administered medications for this visit. (Ophthalmic Drugs)   Current Outpatient Medications (Other)  Medication Sig  . alendronate (FOSAMAX) 70 MG tablet Take 1 tablet (70 mg total) by mouth every 7 (seven) days. Take with a full glass of water on an empty stomach.  Marland Kitchen amLODipine (NORVASC) 5 MG tablet Take 1 tablet (5 mg total) by mouth daily.  Marland Kitchen atorvastatin (LIPITOR) 20 MG tablet Take 1 tablet (20 mg total) by mouth daily.  . cholecalciferol (VITAMIN D3) 25 MCG (1000 UT) tablet Take 1,000 Units by mouth daily.  Marland Kitchen CRANBERRY FRUIT PO Take 1 tablet by mouth daily.  . diclofenac Sodium (VOLTAREN) 1 % GEL Apply 2 g topically 4 (four) times  daily.   Marland Kitchen ELIQUIS 5 MG TABS tablet Take 5 mg by mouth 2 (two) times daily.  . FEROSUL 325 (65 Fe) MG tablet TAKE  (1)  TABLET TWICE A DAY WITH MEALS (BREAKFAST AND SUPPER)  . Garlic 824 MG TABS Take by mouth.  . LEUCOVORIN CALCIUM IV Inject into the vein every 14 (fourteen) days.   Marland Kitchen lidocaine-prilocaine (EMLA) cream Apply a small amount to port a cath site and cover with plastic wrap 1 hour prior to chemotherapy appointments  . losartan-hydrochlorothiazide (HYZAAR) 100-25 MG tablet Take 1 tablet by mouth daily.  . OXALIPLATIN IV Inject into the vein every 14 (fourteen) days.   . potassium chloride SA (KLOR-CON) 20 MEQ tablet Take 1 tablet (20 mEq total) by mouth daily.  . prochlorperazine (COMPAZINE) 10 MG tablet Take 1 tablet (10 mg total) by mouth every 6 (six) hours as needed for nausea or vomiting.  . saccharomyces boulardii (FLORASTOR) 250 MG capsule Take by mouth.    No current facility-administered medications for this visit. (Other)      REVIEW OF SYSTEMS: ROS    Positive for: Genitourinary, Musculoskeletal, Eyes   Negative for: Constitutional, Gastrointestinal, Neurological, Skin, HENT, Endocrine, Cardiovascular, Respiratory, Psychiatric, Allergic/Imm, Heme/Lymph   Last edited by Elmore Guise, COT on 02/25/2021  8:13 AM. (History)  ALLERGIES Allergies  Allergen Reactions  . Lisinopril Cough    PAST MEDICAL HISTORY Past Medical History:  Diagnosis Date  . Arthritis   . Cancer ([place])   . Cataract    OU  . Colon cancer ([place])   . Family history of pancreatic cancer 08/21/2020  . Family history of uterine cancer 08/21/2020  . H/O cesarean section   . Hx of tonsillectomy   . Hyperlipidemia   . Hypertension   . Macular degeneration    Exu ARMD OU  . Paroxysmal atrial fibrillation ([place]) 07/05/2020   Past Surgical History:  Procedure Laterality Date  . BIOPSY  04/06/2020   Procedure: BIOPSY;  Surgeon: Rogene Houston, MD;  Location: [facility];  Service:  Endoscopy;;  . CARPAL TUNNEL RELEASE Right   . CATARACT EXTRACTION W/PHACO Left 10/11/2019   Procedure: CATARACT EXTRACTION PHACO AND INTRAOCULAR LENS PLACEMENT (IOC);  Surgeon: Baruch Goldmann, MD;  Location: [facility] ORS;  Service: Ophthalmology;  Laterality: Left;  CDE: 8.56  . CATARACT EXTRACTION W/PHACO Right 10/25/2019   Procedure: CATARACT EXTRACTION PHACO AND INTRAOCULAR LENS PLACEMENT (IOC);  Surgeon: Baruch Goldmann, MD;  Location: [facility] ORS;  Service: Ophthalmology;  Laterality: Right;  CDE: 5.67  . CESAREAN SECTION    . COLONOSCOPY N/A 04/06/2020   Procedure: COLONOSCOPY;  Surgeon: Rogene Houston, MD;  Location: [facility];  Service: Endoscopy;  Laterality: N/A;  . CYSTOSCOPY WITH BIOPSY N/A 04/08/2020   Procedure: CYSTOSCOPY WITH BIOPSY;  Surgeon: Cleon Gustin, MD;  Location: [facility] ORS;  Service: Urology;  Laterality: N/A;  . ESOPHAGOGASTRODUODENOSCOPY N/A 04/05/2020   Procedure: ESOPHAGOGASTRODUODENOSCOPY (EGD);  Surgeon: Rogene Houston, MD;  Location: [facility];  Service: Endoscopy;  Laterality: N/A;  . PARTIAL COLECTOMY N/A 04/08/2020   Procedure: PARTIAL COLECTOMY;  Surgeon: Virl Cagey, MD;  Location: [facility] ORS;  Service: General;  Laterality: N/A;  . PORTACATH PLACEMENT Left 05/18/2020   Procedure: INSERTION PORT-A-CATH (ATTACHED CATHETER IN LEFT SUBCLAVIAN);  Surgeon: Virl Cagey, MD;  Location: [facility] ORS;  Service: General;  Laterality: Left;  . TONSILLECTOMY      FAMILY HISTORY Family History  Problem Relation Age of Onset  . Macular degeneration Mother   . Stroke Mother   . Hypertension Mother   . Dementia Mother   . Lung cancer Father        dx late 48s; smoking hx  . Diabetes Sister   . Hypertension Sister   . Leukemia Paternal Uncle        d. 66s  . Cancer Maternal Aunt        ovarian or endometrial dx 22s  . Pancreatic cancer Paternal Uncle        d. late 78s    SOCIAL HISTORY Social History   Tobacco Use  . Smoking status: Never Smoker  .  Smokeless tobacco: Never Used  Vaping Use  . Vaping Use: Never used  Substance Use Topics  . Alcohol use: No  . Drug use: No         OPHTHALMIC EXAM:  Base Eye Exam    Visual Acuity (Snellen - Linear)      Right Left   Dist cc 20/CF 20/100-1   Dist ph cc 20/NI 20/NI   Correction: Glasses       Tonometry (Tonopen, 8:15 AM)      Right Left   Pressure 19 16       Pupils      Dark Light Shape React APD  Right 4 3 Round Minimal +1   Left 4 3 Round Brisk None       Visual Fields      Left Right    Full    Restrictions  Partial inner superior temporal, inferior temporal, superior nasal, inferior nasal deficiencies       Extraocular Movement      Right Left    Full, Ortho Full, Ortho       Neuro/Psych    Oriented x3: Yes   Mood/Affect: Normal       Dilation    Both eyes: 1.0% Mydriacyl, 2.5% Phenylephrine @ 8:15 AM        Slit Lamp and Fundus Exam    Slit Lamp Exam      Right Left   Lids/Lashes Dermatochalasis - upper lid Dermatochalasis - upper lid   Conjunctiva/Sclera White and quiet White and quiet   Cornea Arcus, 2-3+ fine Punctate epithelial erosions Arcus, 2+ fine Punctate epithelial erosions, mild tear film debris   Anterior Chamber Deep and quiet Deep and quiet   Iris Round and well dilated Round and well dilated   Lens PCIOL in good position, 1+ Posterior capsular opacification PC IOL in good position with open PC   Vitreous Vitreous syneresis, Posterior vitreous detachment, vitreous condensations Vitreous syneresis, Posterior vitreous detachment, silicone oil micro drops       Fundus Exam      Right Left   Disc pink and sharp, compact pink and sharp, Compact, vascular loops superiorly   C/D Ratio 0.2 0.2   Macula Blunted foveal reflex, Drusen, Pigment clumping and atrophy, central thickening/disciform scar, +PED, no heme, persistent cystic changes overlying central scar Blunted foveal reflex, central thickening, RPE clumping and atrophy, Drusen,  RPE rip, mild IRF/SRF overlying PED -- persistent, no heme   Vessels Vascular attenuation, Tortuous Vascular attenuation, Tortuous   Periphery Attached, mild Reticular degeneration Attached, mild Reticular degeneration        Refraction    Wearing Rx      Sphere Cylinder Add   Right [place] Sphere +3.50   Left -0.50 Sphere +3.50   Type: PAL          IMAGING AND PROCEDURES  Imaging and Procedures for 03/21/18  OCT, Retina - OU - Both Eyes       Right Eye Quality was good. Central Foveal Thickness: 543. Progression has been stable. Findings include pigment epithelial detachment, epiretinal membrane, subretinal hyper-reflective material, retinal drusen , abnormal foveal contour, no SRF, intraretinal fluid, disciform scar, outer retinal tubulation, outer retinal atrophy (Stable sub-retinal scar, mild persistent cystic changes overlying).   Left Eye Quality was good. Central Foveal Thickness: 307. Progression has been stable. Findings include subretinal hyper-reflective material, pigment epithelial detachment, intraretinal fluid, retinal drusen , abnormal foveal contour, outer retinal atrophy, subretinal fluid (stable improvement in central [facility] and persistent IRF/cystic changes).   Notes *Images captured and stored on drive  Diagnosis / Impression:  Exudative ARMD OU OD: Stable sub-retinal scar, mild persistent cystic changes overlying OS: stable improvement in central [facility] and persistent IRF/cystic changes  Clinical management:  See below  Abbreviations: NFP - Normal foveal profile. CME - cystoid macular edema. PED - pigment epithelial detachment. IRF - intraretinal fluid. SRF - subretinal fluid. EZ - ellipsoid zone. ERM - epiretinal membrane. ORA - outer retinal atrophy. ORT - outer retinal tubulation. SRHM - subretinal hyper-reflective material        Intravitreal Injection, Pharmacologic Agent - OS - Left Eye  Time Out 02/25/2021. 8:10 AM. Confirmed correct  patient, procedure, site, and patient consented.   Anesthesia Topical anesthesia was used. Anesthetic medications included Lidocaine 2%, Proparacaine 0.5%.   Procedure Preparation included 5% betadine to ocular surface, eyelid speculum. A (33g) needle was used.   Injection:  2 mg aflibercept Alfonse Flavors) SOLN   NDC: A3590391, Lot: [id number], Expiration date: 09/03/2021   Route: Intravitreal, Site: Left Eye, Waste: 0.05 mL  Post-op Post injection exam found visual acuity of at least counting fingers. The patient tolerated the procedure well. There were no complications. The patient received written and verbal post procedure care education. Post injection medications were not given.                 ASSESSMENT/PLAN:    ICD-10-CM   1. Exudative age-related macular degeneration of both eyes with active choroidal neovascularization (HCC)  H35.3231 Intravitreal Injection, Pharmacologic Agent - OS - Left Eye    aflibercept (EYLEA) SOLN 2 mg  2. Retinal edema  H35.81 OCT, Retina - OU - Both Eyes  3. Posterior vitreous detachment of both eyes  H43.813   4. Pseudophakia of both eyes  Z96.1   5. Ocular hypertension, bilateral  H40.053     1,2. Exudative age related macular degeneration, both eyes.    - severe exudative disease with very active CNVM OU at presentation in January 2019  - S/P IVA OD #1 (01.04.19), #2 (02.15.19)  - S/P IVA OS #1 (01.18.19), #2 (02.15.19)  - switched to Eylea 3.18.19 due to severity of disease  - S/P IVE OD #1 (03.18.19), #2 (04.17.19), #3 (05.15.19), #4 (10.02.19),  #5 (01.15.20), #6 (06.04.20), #7 (10.29.20), #8 (01.21.21), #9 (05.27.21), #10 (07.22.21) -- injections held due to stable disciform scar  - S/P IVE OS #1 (03.18.19), #2 (04.17.19), #3 (05.15.19), #4 (06.12.19), #5 (07.10.19), #6 (08.07.19), #7 (09.04.19), #8 (10.02.19), #9 (11.06.19), #10 (12.11.19), #11 (01.15.20), #12 (02.20.20), #13 (03.26.20), #14 (04.30.20), #15 (06.04.20), #16 (07.16.20), #  17 (08.20.20), #18 (09.24.20), #19 (10.29.20), #20 (12.03.20), #21 (01.21.21), #22 (03.18.21), #23 (05.27.21), #24 (07.22.21), #25 (11.11.21), #26 (12.09.21), #27 (01.06.22), #28 (2.7.22)  - IVE OS held from 7.22.21 to 11.11.21 due to TIA/stroke on 8.1.21  - OCT OS today stable improvement in central [facility] and persistent IRF/cystic changes  - exam OS shows macular heme improved  - OD with significant subretinal disciform scar - mild persistent cystic changes overlying IRHM  - BCVA OD stable at CF 3'; OS decreased to 20/100 from 20/80   - recommend IVE OS #29 today, 03.24.22 -- will cont to hold OD  - Eylea informed consent form signed and scanned on 01.21.2021  - Eylea4U paperwork filled out on 02.15.19 and fully approved through [employer] -- approved  - f/u in 5 weeks, sooner prn for DFE/OCT/possible injection(s)  3. PVD / vitreous syneresis  - Discussed findings and prognosis   - No RT or RD on 360  exam  - Reviewed s/s of RT/RD  - Strict return precautions for any such RT/RD signs/symptoms  4. Pseudophakia OU  - s/p CE/IOL (Dr. Marisa Hua, 11.2020)  - beautiful surgeries w/ IOLs in excellent position, doing well  - monitor  5. Ocular Hypertension OU  - IOP improved today --  19,16  - cont Cosopt qd OU   Ophthalmic Meds Ordered this visit:  Meds ordered this encounter  Medications  . aflibercept (EYLEA) SOLN 2 mg       Return in about 5 weeks (around 04/01/2021) for f/u exu  ARMD OU, DFE, OCT.  There are no Patient Instructions on file for this visit.  This document serves as a record of services personally performed by Gardiner Sleeper, MD, PhD. It was created on their behalf by Estill Bakes, COT an ophthalmic technician. The creation of this record is the provider's dictation and/or activities during the visit.    Electronically signed by: Estill Bakes, COT 3.23.22 @ 1:20 AM   This document serves as a record of services personally performed by Gardiner Sleeper, MD, PhD. It  was created on their behalf by San Jetty. Owens Shark, OA an ophthalmic technician. The creation of this record is the provider's dictation and/or activities during the visit.    Electronically signed by: San Jetty. Owens Shark, OA 03.24.2022 1:20 AM  Gardiner Sleeper, M.D., Ph.D. Diseases & Surgery of the Retina and [place] 02/25/2021   I have reviewed the above documentation for accuracy and completeness, and I agree with the above. Gardiner Sleeper, M.D., Ph.D. 02/26/21 1:20 AM    Abbreviations: M myopia (nearsighted); A astigmatism; H hyperopia (farsighted); P presbyopia; Mrx spectacle prescription;  CTL contact lenses; OD right eye; OS left eye; OU both eyes  XT exotropia; ET esotropia; PEK punctate epithelial keratitis; PEE punctate epithelial erosions; DES dry eye syndrome; MGD meibomian gland dysfunction; ATs artificial tears; PFAT's preservative free artificial tears; [place] nuclear sclerotic cataract; PSC posterior subcapsular cataract; ERM epi-retinal membrane; PVD posterior vitreous detachment; RD retinal detachment; DM diabetes mellitus; DR diabetic retinopathy; NPDR non-proliferative diabetic retinopathy; PDR proliferative diabetic retinopathy; CSME clinically significant macular edema; DME diabetic macular edema; dbh dot blot hemorrhages; CWS cotton wool spot; POAG primary open angle glaucoma; C/D cup-to-disc ratio; HVF humphrey visual field; GVF goldmann visual field; OCT optical coherence tomography; IOP intraocular pressure; BRVO Branch retinal vein occlusion; CRVO central retinal vein occlusion; CRAO central retinal artery occlusion; BRAO branch retinal artery occlusion; RT retinal tear; SB scleral buckle; PPV pars plana vitrectomy; VH Vitreous hemorrhage; PRP panretinal laser photocoagulation; IVK intravitreal kenalog; VMT vitreomacular traction; MH Macular hole;  NVD neovascularization of the disc; NVE neovascularization elsewhere; AREDS age related eye disease  study; ARMD age related macular degeneration; POAG primary open angle glaucoma; EBMD epithelial/anterior basement membrane dystrophy; ACIOL anterior chamber intraocular lens; IOL intraocular lens; PCIOL posterior chamber intraocular lens; Phaco/IOL phacoemulsification with intraocular lens placement; [place] photorefractive keratectomy; LASIK laser assisted in situ keratomileusis; HTN hypertension; DM diabetes mellitus; COPD chronic obstructive pulmonary disease

## 2021-02-25 ENCOUNTER — Encounter (INDEPENDENT_AMBULATORY_CARE_PROVIDER_SITE_OTHER): Payer: Self-pay | Admitting: Ophthalmology

## 2021-02-25 ENCOUNTER — Other Ambulatory Visit: Payer: Self-pay

## 2021-02-25 ENCOUNTER — Ambulatory Visit (INDEPENDENT_AMBULATORY_CARE_PROVIDER_SITE_OTHER): Payer: Medicare HMO | Admitting: Ophthalmology

## 2021-02-25 DIAGNOSIS — H40053 Ocular hypertension, bilateral: Secondary | ICD-10-CM

## 2021-02-25 DIAGNOSIS — H43813 Vitreous degeneration, bilateral: Secondary | ICD-10-CM

## 2021-02-25 DIAGNOSIS — Z961 Presence of intraocular lens: Secondary | ICD-10-CM | POA: Diagnosis not present

## 2021-02-25 DIAGNOSIS — H3581 Retinal edema: Secondary | ICD-10-CM

## 2021-02-25 DIAGNOSIS — H353231 Exudative age-related macular degeneration, bilateral, with active choroidal neovascularization: Secondary | ICD-10-CM | POA: Diagnosis not present

## 2021-02-26 ENCOUNTER — Encounter (INDEPENDENT_AMBULATORY_CARE_PROVIDER_SITE_OTHER): Payer: Self-pay | Admitting: Ophthalmology

## 2021-02-26 DIAGNOSIS — H3581 Retinal edema: Secondary | ICD-10-CM | POA: Diagnosis not present

## 2021-02-26 DIAGNOSIS — H353231 Exudative age-related macular degeneration, bilateral, with active choroidal neovascularization: Secondary | ICD-10-CM | POA: Diagnosis not present

## 2021-02-26 DIAGNOSIS — H40053 Ocular hypertension, bilateral: Secondary | ICD-10-CM | POA: Diagnosis not present

## 2021-02-26 DIAGNOSIS — H43813 Vitreous degeneration, bilateral: Secondary | ICD-10-CM | POA: Diagnosis not present

## 2021-02-26 DIAGNOSIS — Z961 Presence of intraocular lens: Secondary | ICD-10-CM | POA: Diagnosis not present

## 2021-02-26 MED ORDER — AFLIBERCEPT 2MG/0.05ML IZ SOLN FOR KALEIDOSCOPE
2.0000 mg | INTRAVITREAL | Status: AC | PRN
  Administered 2021-02-26: 2 mg via INTRAVITREAL

## 2021-03-08 ENCOUNTER — Encounter (HOSPITAL_COMMUNITY): Payer: Self-pay | Admitting: Hematology

## 2021-03-08 ENCOUNTER — Inpatient Hospital Stay (HOSPITAL_COMMUNITY): Payer: Medicare HMO

## 2021-03-08 ENCOUNTER — Inpatient Hospital Stay (HOSPITAL_COMMUNITY): Payer: Medicare HMO | Attending: Hematology | Admitting: Hematology

## 2021-03-08 ENCOUNTER — Encounter (HOSPITAL_COMMUNITY): Payer: Self-pay

## 2021-03-08 ENCOUNTER — Other Ambulatory Visit: Payer: Self-pay

## 2021-03-08 VITALS — BP 154/62 | HR 70 | Temp 97.2°F | Resp 18

## 2021-03-08 VITALS — BP 161/63 | HR 86 | Temp 97.2°F | Resp 17 | Wt 156.5 lb

## 2021-03-08 DIAGNOSIS — Z5112 Encounter for antineoplastic immunotherapy: Secondary | ICD-10-CM | POA: Insufficient documentation

## 2021-03-08 DIAGNOSIS — C189 Malignant neoplasm of colon, unspecified: Secondary | ICD-10-CM

## 2021-03-08 DIAGNOSIS — C182 Malignant neoplasm of ascending colon: Secondary | ICD-10-CM | POA: Diagnosis not present

## 2021-03-08 LAB — COMPREHENSIVE METABOLIC PANEL
ALT: 20 U/L (ref 0–44)
AST: 23 U/L (ref 15–41)
Albumin: 4.2 g/dL (ref 3.5–5.0)
Alkaline Phosphatase: 75 U/L (ref 38–126)
Anion gap: 11 (ref 5–15)
BUN: 27 mg/dL — ABNORMAL HIGH (ref 8–23)
CO2: 29 mmol/L (ref 22–32)
Calcium: 10 mg/dL (ref 8.9–10.3)
Chloride: 97 mmol/L — ABNORMAL LOW (ref 98–111)
Creatinine, Ser: 1.05 mg/dL — ABNORMAL HIGH (ref 0.44–1.00)
GFR, Estimated: 56 mL/min — ABNORMAL LOW (ref >60)
Glucose, Bld: 94 mg/dL (ref 70–99)
Potassium: 3.6 mmol/L (ref 3.5–5.1)
Sodium: 137 mmol/L (ref 135–145)
Total Bilirubin: 0.7 mg/dL (ref 0.3–1.2)
Total Protein: 7.8 g/dL (ref 6.5–8.1)

## 2021-03-08 LAB — CBC WITH DIFFERENTIAL/PLATELET
Abs Immature Granulocytes: 0.02 10*3/uL (ref 0.00–0.07)
Basophils Absolute: 0 10*3/uL (ref 0.0–0.1)
Basophils Relative: 1 %
Eosinophils Absolute: 0.3 10*3/uL (ref 0.0–0.5)
Eosinophils Relative: 4 %
HCT: 38.6 % (ref 36.0–46.0)
Hemoglobin: 12.7 g/dL (ref 12.0–15.0)
Immature Granulocytes: 0 %
Lymphocytes Relative: 18 %
Lymphs Abs: 1.1 10*3/uL (ref 0.7–4.0)
MCH: 31.1 pg (ref 26.0–34.0)
MCHC: 32.9 g/dL (ref 30.0–36.0)
MCV: 94.6 fL (ref 80.0–100.0)
Monocytes Absolute: 0.6 10*3/uL (ref 0.1–1.0)
Monocytes Relative: 10 %
Neutro Abs: 4 10*3/uL (ref 1.7–7.7)
Neutrophils Relative %: 67 %
Platelets: 133 10*3/uL — ABNORMAL LOW (ref 150–400)
RBC: 4.08 MIL/uL (ref 3.87–5.11)
RDW: 13.2 % (ref 11.5–15.5)
WBC: 6 10*3/uL (ref 4.0–10.5)
nRBC: 0 % (ref 0.0–0.2)

## 2021-03-08 LAB — MAGNESIUM: Magnesium: 1.9 mg/dL (ref 1.7–2.4)

## 2021-03-08 MED ORDER — SODIUM CHLORIDE 0.9% FLUSH
10.0000 mL | INTRAVENOUS | Status: DC | PRN
  Administered 2021-03-08: 10 mL

## 2021-03-08 MED ORDER — HEPARIN SOD (PORK) LOCK FLUSH 100 UNIT/ML IV SOLN
500.0000 [IU] | Freq: Once | INTRAVENOUS | Status: AC | PRN
  Administered 2021-03-08: 500 [IU]

## 2021-03-08 MED ORDER — PEMBROLIZUMAB CHEMO INJECTION 100 MG/4ML
200.0000 mg | Freq: Once | INTRAVENOUS | Status: AC
  Administered 2021-03-08: 200 mg via INTRAVENOUS
  Filled 2021-03-08: qty 8

## 2021-03-08 MED ORDER — SODIUM CHLORIDE 0.9 % IV SOLN: Freq: Once | INTRAVENOUS | Status: AC

## 2021-03-08 NOTE — Patient Instructions (Signed)
[place] at [facility] Discharge Instructions  You were seen today by Dr. Delton Coombes. He went over your recent results and scans. You received your treatment today. Drink plenty of water daily to keep your kidneys flushed. Dr. Delton Coombes will see you back in 3 weeks for labs and follow up.   Thank you for choosing [place] at [facility] to provide your oncology and hematology care.  To afford each patient quality time with our provider, please arrive at least 15 minutes before your scheduled appointment time.   If you have a lab appointment with the [place] please come in thru the Main Entrance and check in at the main information desk  You need to re-schedule your appointment should you arrive 10 or more minutes late.  We strive to give you quality time with our providers, and arriving late affects you and other patients whose appointments are after yours.  Also, if you no show three or more times for appointments you may be dismissed from the clinic at the providers discretion.     Again, thank you for choosing [facility].  Our hope is that these requests will decrease the amount of time that you wait before being seen by our physicians.       _____________________________________________________________  Should you have questions after your visit to [facility], please contact our office at [phone] between the hours of 8:00 a.m. and 4:30 p.m.  Voicemails left after 4:00 p.m. will not be returned until the following business day.  For prescription refill requests, have your pharmacy contact our office and allow 72 hours.    Cancer Center Support Programs:   > Cancer Support Group  2nd Tuesday of the month 1pm-2pm, Journey Room

## 2021-03-08 NOTE — Progress Notes (Signed)
Gabrielle [name] presents today for D1C5 Keytruda. Pt denies any new changes or symptoms since last treatment. Lab results and vitals have been reviewed and are stable and within parameters for treatment. Patient has been assessed by Dr. Delton Coombes who has approved proceeding with treatment today as planned.   Infusions tolerated without incident or complaint. VSS upon completion of treatment. Port flushed and deaccessed per protocol, see MAR and IV flowsheet for details. Discharged in satisfactory condition with follow up instructions.

## 2021-03-08 NOTE — Patient Instructions (Signed)
[facility] Discharge Instructions for Patients Receiving Chemotherapy   Beginning January 23rd 2017 lab work for the Cancer Center will be done in the  Main lab at [place] on 1st floor. If you have a lab appointment with the Cancer Center please come in thru the  Main Entrance and check in at the main information desk   Today you received the following chemotherapy agents Keytruda  To help prevent nausea and vomiting after your treatment, we encourage you to take your nausea medication   If you develop nausea and vomiting, or diarrhea that is not controlled by your medication, call the clinic.  The clinic phone number is [phone]. Office hours are Monday-Friday 8:30am-5:00pm.  BELOW ARE SYMPTOMS THAT SHOULD BE REPORTED IMMEDIATELY:  *FEVER GREATER THAN 101.0 F  *CHILLS WITH OR WITHOUT FEVER  NAUSEA AND VOMITING THAT IS NOT CONTROLLED WITH YOUR NAUSEA MEDICATION  *UNUSUAL SHORTNESS OF BREATH  *UNUSUAL BRUISING OR BLEEDING  TENDERNESS IN MOUTH AND THROAT WITH OR WITHOUT PRESENCE OF ULCERS  *URINARY PROBLEMS  *BOWEL PROBLEMS  UNUSUAL RASH Items with * indicate a potential emergency and should be followed up as soon as possible. If you have an emergency after office hours please contact your primary care physician or go to the nearest emergency department.  Please call the clinic during office hours if you have any questions or concerns.   You may also contact the Patient Navigator at [phone] should you have any questions or need assistance in obtaining follow up care.      Resources For Cancer Patients and their Caregivers ? American Cancer Society: Can assist with transportation, wigs, general needs, runs Look Good Feel Better.        1-888-227-6333 ? Cancer Care: Provides financial assistance, online support groups, medication/co-pay assistance.  1-800-813-HOPE (4673) ? [facility] Assists [place] cancer  patients and their families through emotional , educational and financial support.  336-427-4357 ? [place] DSS Where to apply for food stamps, Medicaid and utility assistance. 336-342-1394 ? [facility]: Transportation to medical appointments. 336-347-2287 ? Social Security Administration: May apply for disability if have a Stage IV cancer. 336-342-7796 1-800-772-1213 ? [place] Aging, Disability and Transit Services: Assists with nutrition, care and transit needs. 336-349-2343          

## 2021-03-08 NOTE — Progress Notes (Signed)
Gabrielle [name], Gabrielle [name]   CLINIC:  Medical Oncology/Hematology  PCP:  Janora Norlander, DO [address] [place] [address] [phone]   REASON FOR VISIT:  Follow-up for right colon cancer  PRIOR THERAPY:  1. Right hemicolectomy on 04/08/2020. 2. FOLFOX x 3 cycles from 05/19/2020 to 07/14/2020.  NGS Results: Foundation 1 KRAS/NRAS wild-type, MSI--high, TMB 55 Muts/Mb, BRAF V 600 E  CURRENT THERAPY: Keytruda every 3 weeks  BRIEF ONCOLOGIC HISTORY:  Oncology History  Malignant neoplasm of colon ([place])  04/07/2020 Initial Diagnosis   Malignant neoplasm of ascending colon ([place])   05/13/2020 Genetic Testing   Foundation One     05/19/2020 - 07/02/2020 Chemotherapy   The patient had palonosetron (ALOXI) injection 0.25 mg, 0.25 mg, Intravenous,  Once, 2 of 12 cycles Administration: 0.25 mg (05/19/2020), 0.25 mg (06/30/2020) leucovorin 522 mg in dextrose 5 % 250 mL infusion, 320 mg/m2 = 522 mg (80 % of original dose 400 mg/m2), Intravenous,  Once, 2 of 12 cycles Dose modification: 320 mg/m2 (80 % of original dose 400 mg/m2, Cycle 1, Reason: Provider Judgment), 309.4076 mg/m2 (66.7 % of original dose 400 mg/m2, Cycle 2, Reason: Provider Judgment) Administration: 522 mg (05/19/2020), 434 mg (06/30/2020) oxaliplatin (ELOXATIN) 110 mg in dextrose 5 % 500 mL chemo infusion, 68 mg/m2 = 110 mg (80 % of original dose 85 mg/m2), Intravenous,  Once, 1 of 1 cycle Dose modification: 68 mg/m2 (80 % of original dose 85 mg/m2, Cycle 1, Reason: Provider Judgment) Administration: 110 mg (05/19/2020) fluorouracil (ADRUCIL) chemo injection 500 mg, 320 mg/m2 = 500 mg (80 % of original dose 400 mg/m2), Intravenous,  Once, 2 of 12 cycles Dose modification: 320 mg/m2 (80 % of original dose 400 mg/m2, Cycle 1, Reason: Provider Judgment), 808.8110 mg/m2 (66.7 % of original dose 400 mg/m2, Cycle 2, Reason: Provider Judgment) Administration: 500 mg (05/19/2020), 450  mg (06/30/2020) fluorouracil (ADRUCIL) 3,150 mg in sodium chloride 0.9 % 87 mL chemo infusion, 1,920 mg/m2 = 3,150 mg (80 % of original dose 2,400 mg/m2), Intravenous, 1 Day/Dose, 2 of 12 cycles Dose modification: 1,920 mg/m2 (80 % of original dose 2,400 mg/m2, Cycle 1, Reason: Provider Judgment), 1,600 mg/m2 (66.7 % of original dose 2,400 mg/m2, Cycle 2, Reason: Provider Judgment) Administration: 3,150 mg (05/19/2020), 2,600 mg (06/30/2020)  for chemotherapy treatment.    08/03/2020 Genetic Testing   Guardant Reveal Testing     08/14/2020 Genetic Testing   No pathogenic variants detected in [facility] Common Hereditary Cancers Panel.  Variant of uncertain significance (VUS) detected in HOXB13 at c.634G>A (p.Ala212Thr). The Common Hereditary Cancers Panel offered by [facility] includes sequencing and/or deletion duplication testing of the following 48 genes: APC, ATM, AXIN2, BARD1, BMPR1A, BRCA1, BRCA2, BRIP1, CDH1, CDK4, CDKN2A (p14ARF), CDKN2A (p16INK4a), CHEK2, CTNNA1, DICER1, EPCAM (Deletion/duplication testing only), FLCN, GREM1 (promoter region deletion/duplication testing only), KIT, MEN1, MLH1, MSH2, MSH3, MSH6, MUTYH, NBN, NF1, NHTL1, PALB2, PDGFRA, PMS2, POLD1, POLE, PTEN, RAD50, RAD51C, RAD51D, RNF43, SDHB, SDHC, SDHD, SMAD4, SMARCA4. STK11, TP53, TSC1, TSC2, and VHL.  The following genes were evaluated for sequence changes only: SDHA and HOXB13 c.251G>A variant only. The report date is August 14, 2020.    11/05/2020 Cancer Staging   Staging form: Colon and Rectum, AJCC 8th Edition - Pathologic stage from 11/05/2020: Stage IVC (rpTX, pN0, pM1c) - Signed by Derek Jack, MD on 11/05/2020   11/10/2020 -  Chemotherapy    Patient is on Treatment Plan: COLORECTAL PEMBROLIZUMAB Q21D  CANCER STAGING: Cancer Staging Malignant neoplasm of colon [facility]) Staging form: Colon and Rectum, AJCC 8th Edition - Clinical stage from 04/27/2020: Stage IIIB (cT4a, cN1a, cM0) - Unsigned - Pathologic  stage from 11/05/2020: Stage IVC (rpTX, pN0, pM1c) - Signed by Derek Jack, MD on 11/05/2020   INTERVAL HISTORY:  [name], a 75 y.o. female, returns for routine follow-up and consideration for next cycle of immunotherapy. Yailene was last seen on 02/10/2021.  Due for cycle #5 of Keytruda today.   Today she is accompanied by her daughter. Overall, she tells me she has been feeling pretty well. She tolerated the previous treatment well and denies having N/V/D/C, skin rash, itching or cough. She continues walking 3-4 times per day. Her appetite is excellent. She received dorzolamide-timolol eye drops from Dr. Coralyn Pear which has slightly improved her cloudy vision.  Overall, she feels ready for next cycle of immunotherapy today.    REVIEW OF SYSTEMS:  Review of Systems  Constitutional: Negative for appetite change and fatigue.  HENT:   Positive for hearing loss.   Respiratory: Negative for cough.   Gastrointestinal: Negative for constipation, diarrhea, nausea and vomiting.  Skin: Negative for itching and rash.  All other systems reviewed and are negative.   PAST MEDICAL/SURGICAL HISTORY:  Past Medical History:  Diagnosis Date  . Arthritis   . Cancer ([place])   . Cataract    OU  . Colon cancer ([place])   . Family history of pancreatic cancer 08/21/2020  . Family history of uterine cancer 08/21/2020  . H/O cesarean section   . Hx of tonsillectomy   . Hyperlipidemia   . Hypertension   . Macular degeneration    Exu ARMD OU  . Paroxysmal atrial fibrillation ([place]) 07/05/2020   Past Surgical History:  Procedure Laterality Date  . BIOPSY  04/06/2020   Procedure: BIOPSY;  Surgeon: Rogene Houston, MD;  Location[facility];  Service: Endoscopy;;  . CARPAL TUNNEL RELEASE Right   . CATARACT EXTRACTION W/PHACO Left 10/11/2019   Procedure: CATARACT EXTRACTION PHACO AND INTRAOCULAR LENS PLACEMENT (IOC);  Surgeon: Baruch Goldmann, MD;  Location: [facility] ORS;  Service: Ophthalmology;   Laterality: Left;  CDE: 8.56  . CATARACT EXTRACTION W/PHACO Right 10/25/2019   Procedure: CATARACT EXTRACTION PHACO AND INTRAOCULAR LENS PLACEMENT (IOC);  Surgeon: Baruch Goldmann, MD;  Location: [facility] OR[facility];  Service: Ophthalmology;  Laterality: Right;  CDE: 5.67  . CESAREAN SECTION    . COLONOSCOPY N/A 04/06/2020   Procedure: COLONOSCOPY;  Surgeon: Rogene Houston, MD;  Location: [facility];  Service: Endoscopy;  Laterality: N/A;  . CYSTOSCOPY WITH BIOPSY N/A 04/08/2020   Procedure: CYSTOSCOPY WITH BIOPSY;  Surgeon: Cleon Gustin, MD;  Location: [facility] OR[facility];  Service: Urology;  Laterality: N/A;  . ESOPHAGOGASTRODUODENOSCOPY N/A 04/05/2020   Procedure: ESOPHAGOGASTRODUODENOSCOPY (EGD);  Surgeon: Rogene Houston, MD;  Location[facility];  Service: Endoscopy;  Laterality: N/A;  . PARTIAL COLECTOMY N/A 04/08/2020   Procedure: PARTIAL COLECTOMY;  Surgeon: Virl Cagey, MD;  Location: [facility];  Service: General;  Laterality: N/A;  . PORTACATH PLACEMENT Left 05/18/2020   Procedure: INSERTION PORT-A-CATH (ATTACHED CATHETER IN LEFT SUBCLAVIAN);  Surgeon: Virl Cagey, MD;  Location: [facility];  Service: General;  Laterality: Left;  . TONSILLECTOMY      SOCIAL HISTORY:  Social History   Socioeconomic History  . Marital status: Divorced    Spouse name: Not on file  . Number of children: Not on file  .  Years of education: Not on file  . Highest education level: Not on file  Occupational History  . Not on file  Tobacco Use  . Smoking status: Never Smoker  . Smokeless tobacco: Never Used  Vaping Use  . Vaping Use: Never used  Substance and Sexual Activity  . Alcohol use: No  . Drug use: No  . Sexual activity: Not Currently  Other Topics Concern  . Not on file  Social History Narrative  . Not on file   Social Determinants of Health   Financial Resource Strain: Medium Risk  . Difficulty of Paying Living Expenses: Somewhat hard  Food Insecurity: Food Insecurity Present  . Worried  About Charity fundraiser in the Last Year: Sometimes true  . Ran Out of Food in the Last Year: Never true  Transportation Needs: Unmet Transportation Needs  . Lack of Transportation (Medical): Yes  . Lack of Transportation (Non-Medical): Yes  Physical Activity: Insufficiently Active  . Days of Exercise per Week: 7 days  . Minutes of Exercise per Session: 20 min  Stress: Stress Concern Present  . Feeling of Stress : Very much  Social Connections: Moderately Integrated  . Frequency of Communication with Friends and Family: More than three times a week  . Frequency of Social Gatherings with Friends and Family: Once a week  . Attends Religious Services: More than 4 times per year  . Active Member of Clubs or Organizations: Yes  . Attends Archivist Meetings: Never  . Marital Status: Divorced  Human resources officer Violence: Not At Risk  . Fear of Current or Ex-Partner: No  . Emotionally Abused: No  . Physically Abused: No  . Sexually Abused: No    FAMILY HISTORY:  Family History  Problem Relation Age of Onset  . Macular degeneration Mother   . Stroke Mother   . Hypertension Mother   . Dementia Mother   . Lung cancer Father        dx late 59s; smoking hx  . Diabetes Sister   . Hypertension Sister   . Leukemia Paternal Uncle        d. 47s  . Cancer Maternal Aunt        ovarian or endometrial dx 74s  . Pancreatic cancer Paternal Uncle        d. late 71s    CURRENT MEDICATIONS:  Current Outpatient Medications  Medication Sig Dispense Refill  . alendronate (FOSAMAX) 70 MG tablet Take 1 tablet (70 mg total) by mouth every 7 (seven) days. Take with a full glass of water on an empty stomach. 4 tablet 11  . amLODipine (NORVASC) 5 MG tablet Take 1 tablet (5 mg total) by mouth daily. 90 tablet 3  . atorvastatin (LIPITOR) 20 MG tablet Take 1 tablet (20 mg total) by mouth daily. 90 tablet 3  . cholecalciferol (VITAMIN D3) 25 MCG (1000 UT) tablet Take 1,000 Units by mouth daily.     Marland Kitchen CRANBERRY FRUIT PO Take 1 tablet by mouth daily.    . diclofenac Sodium (VOLTAREN) 1 % GEL Apply 2 g topically 4 (four) times daily.     . dorzolamide-timolol (COSOPT) 22.3-6.8 MG/ML ophthalmic solution PLACE 1 DROP INTO BOTH EYES 2 TIMES A DAY (Patient taking differently: Place 1 drop into both eyes 1 day or 1 dose.) 10 mL 0  . ELIQUIS 5 MG TABS tablet Take 5 mg by mouth 2 (two) times daily.    . FEROSUL 325 (65 Fe) MG tablet TAKE  (  1)  TABLET TWICE A DAY WITH MEALS (BREAKFAST AND SUPPER) 180 tablet 0  . Garlic 347 MG TABS Take by mouth.    . LEUCOVORIN CALCIUM IV Inject into the vein every 14 (fourteen) days.     Marland Kitchen lidocaine-prilocaine (EMLA) cream Apply a small amount to port a cath site and cover with plastic wrap 1 hour prior to chemotherapy appointments 30 g 0  . losartan-hydrochlorothiazide (HYZAAR) 100-25 MG tablet Take 1 tablet by mouth daily.    . OXALIPLATIN IV Inject into the vein every 14 (fourteen) days.     . potassium chloride SA (KLOR-CON) 20 MEQ tablet Take 1 tablet (20 mEq total) by mouth daily. 90 tablet 1  . prochlorperazine (COMPAZINE) 10 MG tablet Take 1 tablet (10 mg total) by mouth every 6 (six) hours as needed for nausea or vomiting. (Patient not taking: Reported on 03/08/2021) 30 tablet 0   No current facility-administered medications for this visit.    ALLERGIES:  Allergies  Allergen Reactions  . Lisinopril Cough    PHYSICAL EXAM:  Performance status (ECOG): 1 - Symptomatic but completely ambulatory  Vitals:   03/08/21 0919  BP: (!) 161/63  Pulse: 86  Resp: 17  Temp: (!) 97.2 F (36.2 C)  SpO2: 99%   Wt Readings from Last 3 Encounters:  03/08/21 156 lb 8.4 oz (71 kg)  02/10/21 153 lb 3.2 oz (69.5 kg)  02/10/21 153 lb 3.2 oz (69.5 kg)   Physical Exam Vitals reviewed.  Constitutional:      Appearance: Normal appearance.  Cardiovascular:     Rate and Rhythm: Normal rate and regular rhythm.     Pulses: Normal pulses.     Heart sounds: Normal  heart sounds.  Pulmonary:     Effort: Pulmonary effort is normal.     Breath sounds: Normal breath sounds.  Chest:  Breasts:     Right: No supraclavicular adenopathy.     Left: No supraclavicular adenopathy.      Comments: Port-a-Cath in L chest Abdominal:     Palpations: Abdomen is soft. There is no hepatomegaly or mass.     Tenderness: There is no abdominal tenderness.     Hernia: No hernia is present.  Musculoskeletal:     Right lower leg: No edema.     Left lower leg: No edema.  Lymphadenopathy:     Cervical: No cervical adenopathy.     Upper Body:     Right upper body: No supraclavicular adenopathy.     Left upper body: No supraclavicular adenopathy.     Lower Body: No right inguinal adenopathy. No left inguinal adenopathy.  Neurological:     General: No focal deficit present.     Mental Status: She is alert and oriented to person, place, and time.  Psychiatric:        Mood and Affect: Mood normal.        Behavior: Behavior normal.     LABORATORY DATA:  I have reviewed the labs as listed.  CBC Latest Ref Rng & Units 03/08/2021 02/09/2021 01/18/2021  WBC 4.0 - 10.5 K/uL 6.0 4.7 4.8  Hemoglobin 12.0 - 15.0 g/dL 12.7 12.6 12.6  Hematocrit 36.0 - 46.0 % 38.6 38.7 37.9  Platelets 150 - 400 K/uL 133(L) 138(L) 130(L)   CMP Latest Ref Rng & Units 03/08/2021 02/09/2021 01/18/2021  Glucose 70 - 99 mg/dL 94 102(H) 100(H)  BUN 8 - 23 mg/dL 27(H) 24(H) 31(H)  Creatinine 0.44 - 1.00 mg/dL 1.05(H) 1.09(H) 1.03(H)  Sodium 135 - 145 mmol/L 137 138 136  Potassium 3.5 - 5.1 mmol/L 3.6 3.2(L) 3.8  Chloride 98 - 111 mmol/L 97(L) 98 99  CO2 22 - 32 mmol/L $RemoveB'29 29 29  'FZauKppI$ Calcium 8.9 - 10.3 mg/dL 10.0 9.9 9.7  Total Protein 6.5 - 8.1 g/dL 7.8 7.8 7.8  Total Bilirubin 0.3 - 1.2 mg/dL 0.7 0.6 0.8  Alkaline Phos 38 - 126 U/L 75 57 59  AST 15 - 41 U/L $Remo'23 21 19  'gBdcp$ ALT 0 - 44 U/L $Remo'20 14 13   'mHNkm$ Lab Results  Component Value Date   CEA1 1.6 01/18/2021   CEA1 5.9 (H) 11/05/2020   CEA1 4.5 08/03/2020     DIAGNOSTIC IMAGING:  I have independently reviewed the scans and discussed with the patient. CT CHEST ABDOMEN PELVIS W CONTRAST  Result Date: 02/09/2021 CLINICAL DATA:  Ascending colon cancer post partial colectomy 04/08/2020. Assess treatment response. EXAM: CT CHEST, ABDOMEN, AND PELVIS WITH CONTRAST TECHNIQUE: Multidetector CT imaging of the chest, abdomen and pelvis was performed following the standard protocol during bolus administration of intravenous contrast. CONTRAST:  165mL OMNIPAQUE IOHEXOL 300 MG/ML  SOLN COMPARISON:  CT 11/02/2020 and 06/02/2020.  PET-CT 05/11/2020. FINDINGS: CT CHEST FINDINGS Cardiovascular: Atherosclerosis of the aorta, great vessels and coronary arteries. No acute vascular findings are seen. Left subclavian Port-A-Cath extends into the left brachiocephalic vein, unchanged. Mild cardiomegaly without significant pericardial fluid. Mediastinum/Nodes: There are no enlarged mediastinal, hilar or axillary lymph nodes. The thyroid gland, trachea and esophagus demonstrate no significant findings. Lungs/Pleura: There is no pleural effusion. Scattered small pulmonary nodules are unchanged, largest measuring 7 mm in the right lower lobe (image 81/3) and 6 mm in the left lower lobe (image 84/3). These nodules appears stable from baseline PET-CT. No new or enlarging pulmonary nodules identified. Musculoskeletal/Chest wall: No chest wall mass or suspicious osseous findings. CT ABDOMEN AND PELVIS FINDINGS Hepatobiliary: The liver is normal in density without suspicious focal abnormality. No evidence of gallstones, gallbladder wall thickening or biliary dilatation. Pancreas: Unremarkable. No pancreatic ductal dilatation or surrounding inflammatory changes. Spleen: 10 mm low-density lesion medially in the spleen on image 44/2 has slightly enlarged compared with the prior study, although does not appear to show significant enhancement on the delayed post-contrast images. No other focal  splenic abnormality. Adrenals/Urinary Tract: Both adrenal glands appear normal. The kidneys appear stable with low and intermediate density lesions bilaterally, likely simple and mildly complex cysts. No enlarging lesion, urinary tract calculus or hydronephrosis. There is chronic bladder distension and circumferential wall thickening, similar to previous studies. Stomach/Bowel: Enteric contrast was administered and has passed into the descending colon. The stomach appears normal for its degree of distention. No significant abnormalities the small bowel or colon are seen status post right colectomy. Moderate stool in the distal colon. Vascular/Lymphatic: There are no residual enlarged abdominal or pelvic lymph nodes. A 10 mm portacaval node on image 57/2 is stable. The previously demonstrated nodal masses in the ileocolonic mesentery have largely resolved. There is a residual ill-defined nodule medial to the tip of the right hepatic lobe which measures 3.0 x 2.4 cm on image 67/2 (previously 3.4 x 3.3 cm). Aortic and branch vessel atherosclerosis without acute vascular findings. The portal, superior mesenteric and splenic veins are patent. Reproductive: Previously noted low-density lesion at the uterine fundus is no longer visualized. The uterus and ovaries appear unremarkable. No adnexal mass. Other: As above, improving lymph node or peritoneal implant inferior to the right hepatic lobe. No ascites or  other peritoneal nodularity. Intact abdominal wall without residual nodularity inferior to the umbilicus. Musculoskeletal: No acute or significant osseous findings. Lower lumbar spondylosis. IMPRESSION: 1. Interval near-complete resolution of previously demonstrated nodal masses in the ileocolonic mesentery consistent with response to therapy. There is a residual ill-defined nodule medial to the tip of the right hepatic lobe which is likely a treated metastasis. No evidence of disease progression. 2. Stable small  pulmonary nodules bilaterally consistent with benign findings based on stability. 3. Slight enlargement of a 10 mm low-density lesion medially in the spleen, indeterminate in etiology. Attention on follow-up recommended. 4. Chronic bladder distension and circumferential wall thickening, similar to previous studies, suggesting chronic bladder outlet obstruction or neurogenic bladder. 5. Grossly stable cystic lesions in both kidneys. 6. Aortic Atherosclerosis (ICD10-I70.0). Electronically Signed   By: Richardean Sale M.D.   On: 02/09/2021 16:23   Intravitreal Injection, Pharmacologic Agent - OS - Left Eye  Result Date: 02/26/2021 Time Out 02/25/2021. 8:10 AM. Confirmed correct patient, procedure, site, and patient consented. Anesthesia Topical anesthesia was used. Anesthetic medications included Lidocaine 2%, Proparacaine 0.5%. Procedure Preparation included 5% betadine to ocular surface, eyelid speculum. A (33g) needle was used. Injection: 2 mg aflibercept Alfonse Flavors) SOLN   NDC: [id number], Lot: [id number], Expiration date: 09/03/2021   Route: Intravitreal, Site: Left Eye, Waste: 0.05 mL Post-op Post injection exam found visual acuity of at least counting fingers. The patient tolerated the procedure well. There were no complications. The patient received written and verbal post procedure care education. Post injection medications were not given.   OCT, Retina - OU - Both Eyes  Result Date: 02/26/2021 Right Eye Quality was good. Central Foveal Thickness: 543. Progression has been stable. Findings include pigment epithelial detachment, epiretinal membrane, subretinal hyper-reflective material, retinal drusen , abnormal foveal contour, no SRF, intraretinal fluid, disciform scar, outer retinal tubulation, outer retinal atrophy (Stable sub-retinal scar, mild persistent cystic changes overlying). Left Eye Quality was good. Central Foveal Thickness: 307. Progression has been stable. Findings include subretinal  hyper-reflective material, pigment epithelial detachment, intraretinal fluid, retinal drusen , abnormal foveal contour, outer retinal atrophy, subretinal fluid (stable improvement in central [facility] and persistent IRF/cystic changes). Notes *Images captured and stored on drive Diagnosis / Impression: Exudative ARMD OU OD: Stable sub-retinal scar, mild persistent cystic changes overlying OS: stable improvement in central [facility] and persistent IRF/cystic changes Clinical management: See below Abbreviations: NFP - Normal foveal profile. CME - cystoid macular edema. PED - pigment epithelial detachment. IRF - intraretinal fluid. SRF - subretinal fluid. EZ - ellipsoid zone. ERM - epiretinal membrane. ORA - outer retinal atrophy. ORT - outer retinal tubulation. SRHM - subretinal hyper-reflective material     ASSESSMENT:  1. Stage IIIb (T4AN1A) poorly differentiated right colon adenocarcinoma: -Right hemicolectomy on 04/07/2020 with poorly differentiated adenocarcinoma, pT4a, positive radial margin, 1/15 lymph nodes positive, loss of MLH1 and PMS2, bladder biopsy negative for malignancy. -CTAP on 04/06/2020 showed circumferential ascending colon mass with numerous borderline enlarged pericolonic lymph nodes. No findings of hepatic metastatic disease. -CEA on 04/04/2020 was 12.2. CEA improved to 1.7 on 05/13/2020. -PET scan on 05/11/2020 shows mild FDG uptake associated with the anastomotic site. Small pulmonary nodules that do not show elevated FDG activity, remain nonspecific, subcentimeter. Persistent but improved bladder wall thickening. -Cycle 1 of dose reduced FOLFOX on 05/19/2020, followed by 2 hospitalizations, 1 from acute kidney injury from diarrhea and second admission for C. difficile colitis. -Cycle 2 of 5-FU and leucovorin dose reduced on 06/30/2020.Chemotherapy discontinued secondary to intolerance. -  Follow-up CT scan on 11/02/2020 with multiple soft tissue lesions in the central and right mesentery measuring  up to 3.1 x 1.7 cm. Mild lymphadenopathy in the hepatic duodenal ligament, retroperitoneal space, right common iliac chain, bilateral external iliac chains. 17 mm soft tissue nodule in the lower anterior abdominal wall. Bilateral tiny pulmonary nodules not substantially changed. 2.9 x 2.5 cm low-density lesion along the posterior uterus is indeterminate. -Pembrolizumab started on 11/10/2020. -CT CAP from 02/09/2021 showed interval near complete resolution of previously demonstrated nodal mass in the ileocolonic mesentery.  Residual ill-defined nodule medial to the tip of the right hepatic lobe.  Stable small pulmonary nodules bilaterally consistent with benign findings.  2. Family history: -Paternal uncle had colon cancer. Maternal aunt had brain cancer and another maternal aunt had gynecological malignancy. Father had lung cancer and was a smoker. -Genetic testing did not reveal any notable mutations.  3. Diffuse erythema and nodularity of the dome of the bladder: -CT scan showed severely thickened bladder wall. -Cystoscopy on 04/08/2020 showed diffuse erythema, nodularity in the posterior wall tracking to the dome. Ureteral orifices were in normal locations. Biopsies were benign.   PLAN:  1.Stage IV right colon adenocarcinoma, MSI-high: -We have held her last treatment secondary to eye symptoms including redness and blurring of vision. -She was evaluated by Dr. Coralyn Pear.  Immunotherapy related complications were ruled out. -I have reviewed labs today which showed normal LFTs and CBC.  Mild thrombocytopenia stable. -She does not have any immunotherapy related side effects like diarrhea, dry cough or shortness of breath. -She will proceed with her Keytruda infusion today.  We will see her back in 3 weeks for follow-up.  Plan to repeat CT scan in June.  2.Paroxysmal atrial fibrillation: -Watchman procedure at [facility] was rescheduled to May. -Continue Eliquis.  No  bleeding issues.  3.Hypertension: -Continue Hyzaar and amlodipine.   Orders placed this encounter:  No orders of the defined types were placed in this encounter.    Derek Jack, MD [place] [phone]   I, Milinda Antis, am acting as a scribe for Dr. Sanda Linger.  I, Derek Jack MD, have reviewed the above documentation for accuracy and completeness, and I agree with the above.

## 2021-03-08 NOTE — Progress Notes (Signed)
Patient was assessed by Dr. Katragadda and labs have been reviewed.  Patient is okay to proceed with treatment today. Primary RN and pharmacy aware.   

## 2021-03-09 LAB — CEA: CEA: 1.3 ng/mL (ref 0.0–4.7)

## 2021-03-21 DIAGNOSIS — Z85038 Personal history of other malignant neoplasm of large intestine: Secondary | ICD-10-CM | POA: Diagnosis not present

## 2021-03-21 DIAGNOSIS — M6281 Muscle weakness (generalized): Secondary | ICD-10-CM | POA: Diagnosis not present

## 2021-03-21 DIAGNOSIS — I69898 Other sequelae of other cerebrovascular disease: Secondary | ICD-10-CM | POA: Diagnosis not present

## 2021-03-21 DIAGNOSIS — M48061 Spinal stenosis, lumbar region without neurogenic claudication: Secondary | ICD-10-CM | POA: Diagnosis not present

## 2021-03-24 ENCOUNTER — Other Ambulatory Visit: Payer: Self-pay | Admitting: Family Medicine

## 2021-03-29 NOTE — Progress Notes (Signed)
[facility] Note  04/01/2021     CHIEF COMPLAINT Patient presents for Retina Follow Up   HISTORY OF PRESENT ILLNESS: Gabrielle [name] is a 75 y.o. female who presents to the clinic today for:   HPI    Retina Follow Up    Patient presents with  Wet AMD.  In both eyes.  This started years ago.  Severity is moderate.  Duration of 5 weeks.  Since onset it is stable.  I, the attending physician,  performed the HPI with the patient and updated documentation appropriately.          Comments    75 y/o female pt here for 5 wk f/u for exu ARMD OU.  No change in [place] OU.  Denies major pain, FOL, floaters, but OD feels very dry and irritated; a little sore; FBS.  AT prn OU.       Last edited by Bernarda Caffey, MD on 04/01/2021  5:24 PM. (History)    pt states    Referring physician: Janora Norlander, DO [address] [address],  [place] [id number]  HISTORICAL INFORMATION:   Selected notes from the MEDICAL RECORD NUMBER Referred by Dr. Cristela Blue for concern of ARMD OU   CURRENT MEDICATIONS: Current Outpatient Medications (Ophthalmic Drugs)  Medication Sig  . dorzolamide-timolol (COSOPT) 22.3-6.8 MG/ML ophthalmic solution PLACE 1 DROP INTO BOTH EYES 2 TIMES A DAY (Patient taking differently: Place 1 drop into both eyes 1 day or 1 dose.)   No current facility-administered medications for this visit. (Ophthalmic Drugs)   Current Outpatient Medications (Other)  Medication Sig  . alendronate (FOSAMAX) 70 MG tablet Take 1 tablet (70 mg total) by mouth every 7 (seven) days. Take with a full glass of water on an empty stomach.  Marland Kitchen amLODipine (NORVASC) 5 MG tablet Take 1 tablet (5 mg total) by mouth daily.  Marland Kitchen atorvastatin (LIPITOR) 20 MG tablet Take 1 tablet (20 mg total) by mouth daily. (NEEDS TO BE SEEN BEFORE NEXT REFILL)  . cholecalciferol (VITAMIN D3) 25 MCG (1000 UT) tablet Take 1,000 Units by mouth daily.  Marland Kitchen CRANBERRY FRUIT PO Take 1 tablet by mouth daily.  .  diclofenac Sodium (VOLTAREN) 1 % GEL Apply 2 g topically 4 (four) times daily.   Marland Kitchen ELIQUIS 5 MG TABS tablet Take 5 mg by mouth 2 (two) times daily.  . FEROSUL 325 (65 Fe) MG tablet TAKE  (1)  TABLET TWICE A DAY WITH MEALS (BREAKFAST AND SUPPER)  . Garlic 841 MG TABS Take by mouth.  . LEUCOVORIN CALCIUM IV Inject into the vein every 14 (fourteen) days.   Marland Kitchen lidocaine-prilocaine (EMLA) cream Apply a small amount to port a cath site and cover with plastic wrap 1 hour prior to chemotherapy appointments  . losartan-hydrochlorothiazide (HYZAAR) 100-25 MG tablet Take 1 tablet by mouth daily.  . OXALIPLATIN IV Inject into the vein every 14 (fourteen) days.   . potassium chloride SA (KLOR-CON) 20 MEQ tablet Take 1 tablet (20 mEq total) by mouth daily.  . prochlorperazine (COMPAZINE) 10 MG tablet Take 1 tablet (10 mg total) by mouth every 6 (six) hours as needed for nausea or vomiting. (Patient not taking: Reported on 03/08/2021)   No current facility-administered medications for this visit. (Other)      REVIEW OF SYSTEMS: ROS    Positive for: Gastrointestinal, Genitourinary, Musculoskeletal, Cardiovascular, Eyes   Negative for: Constitutional, Neurological, Skin, HENT, Endocrine, Psychiatric, Allergic/Imm, Heme/Lymph   Last edited  by Matthew Folks, COA on 04/01/2021  8:07 AM. (History)       ALLERGIES Allergies  Allergen Reactions  . Lisinopril Cough    PAST MEDICAL HISTORY Past Medical History:  Diagnosis Date  . Arthritis   . Cancer ([place])   . Cataract    OU  . Colon cancer ([place])   . Family history of pancreatic cancer 08/21/2020  . Family history of uterine cancer 08/21/2020  . H/O cesarean section   . Hx of tonsillectomy   . Hyperlipidemia   . Hypertension   . Macular degeneration    Exu ARMD OU  . Paroxysmal atrial fibrillation ([place]) 07/05/2020   Past Surgical History:  Procedure Laterality Date  . BIOPSY  04/06/2020   Procedure: BIOPSY;  Surgeon: Rogene Houston, MD;   Location: [facility];  Service: Endoscopy;;  . CARPAL TUNNEL RELEASE Right   . CATARACT EXTRACTION W/PHACO Left 10/11/2019   Procedure: CATARACT EXTRACTION PHACO AND INTRAOCULAR LENS PLACEMENT (IOC);  Surgeon: Baruch Goldmann, MD;  Location: [facility];  Service: Ophthalmology;  Laterality: Left;  CDE: 8.56  . CATARACT EXTRACTION W/PHACO Right 10/25/2019   Procedure: CATARACT EXTRACTION PHACO AND INTRAOCULAR LENS PLACEMENT (IOC);  Surgeon: Baruch Goldmann, MD;  Location: [facility];  Service: Ophthalmology;  Laterality: Right;  CDE: 5.67  . CESAREAN SECTION    . COLONOSCOPY N/A 04/06/2020   Procedure: COLONOSCOPY;  Surgeon: Rogene Houston, MD;  [facility]: [facility];  Service: Endoscopy;  Laterality: N/A;  . CYSTOSCOPY WITH BIOPSY N/A 04/08/2020   Procedure: CYSTOSCOPY WITH BIOPSY;  Surgeon: Cleon Gustin, MD;  Location: [facility];  Service: Urology;  Laterality: N/A;  . ESOPHAGOGASTRODUODENOSCOPY N/A 04/05/2020   Procedure: ESOPHAGOGASTRODUODENOSCOPY (EGD);  Surgeon: Rogene Houston, MD;  [facility];  Service: Endoscopy;  Laterality: N/A;  . PARTIAL COLECTOMY N/A 04/08/2020   Procedure: PARTIAL COLECTOMY;  Surgeon: Virl Cagey, MD;  Location: [facility];  Service: General;  Laterality: N/A;  . PORTACATH PLACEMENT Left 05/18/2020   Procedure: INSERTION PORT-A-CATH (ATTACHED CATHETER IN LEFT SUBCLAVIAN);  Surgeon: Virl Cagey, MD;  Location: [facility];  Service: General;  Laterality: Left;  . TONSILLECTOMY      FAMILY HISTORY Family History  Problem Relation Age of Onset  . Macular degeneration Mother   . Stroke Mother   . Hypertension Mother   . Dementia Mother   . Lung cancer Father        dx late 70s; smoking hx  . Diabetes Sister   . Hypertension Sister   . Leukemia Paternal Uncle        d. 14s  . Cancer Maternal Aunt        ovarian or endometrial dx 12s  . Pancreatic cancer Paternal Uncle        d. late 64s    SOCIAL HISTORY Social History   Tobacco Use  .  Smoking status: Never Smoker  . Smokeless tobacco: Never Used  Vaping Use  . Vaping Use: Never used  Substance Use Topics  . Alcohol use: No  . Drug use: No         OPHTHALMIC EXAM:  Base Eye Exam    Visual Acuity (Snellen - Linear)      Right Left   Dist cc CF @ 2' 20/100   Dist ph cc NI NI   Correction: Glasses       Tonometry (Tonopen, 8:09 AM)      Right Left   Pressure  18 15       Pupils      Dark Light Shape React APD   Right 4 3 Round Minimal +1   Left 4 3 Round Brisk None       Visual Fields (Counting fingers)      Left Right    Full    Restrictions  Partial inner superior temporal, inferior temporal, superior nasal, inferior nasal deficiencies       Extraocular Movement      Right Left    Full, Ortho Full, Ortho       Neuro/Psych    Oriented x3: Yes   Mood/Affect: Normal       Dilation    Both eyes: 1.0% Mydriacyl, 2.5% Phenylephrine @ 8:09 AM        Slit Lamp and Fundus Exam    Slit Lamp Exam      Right Left   Lids/Lashes Dermatochalasis - upper lid Dermatochalasis - upper lid   Conjunctiva/Sclera 1+ Injection White and quiet   Cornea Arcus, 2-3+ fine Punctate epithelial erosions Arcus, 2+ fine Punctate epithelial erosions, mild tear film debris   Anterior Chamber Deep and quiet Deep and quiet   Iris Round and well dilated Round and well dilated   Lens PCIOL in good position, 1+ Posterior capsular opacification PC IOL in good position with open PC   Vitreous Vitreous syneresis, Posterior vitreous detachment, vitreous condensations Vitreous syneresis, Posterior vitreous detachment, silicone oil micro drops       Fundus Exam      Right Left   Disc pink and sharp, compact pink and sharp, Compact, vascular loops superiorly   C/D Ratio 0.2 0.2   Macula Blunted foveal reflex, Drusen, Pigment clumping and atrophy, central thickening/disciform scar, +PED, no heme, persistent, trace cystic changes overlying central scar Blunted foveal reflex,  central thickening, RPE clumping and atrophy, Drusen, RPE rip, trace cystic changes overlying PED -- improved, no heme   Vessels Vascular attenuation, Tortuous Vascular attenuation, Tortuous   Periphery Attached, mild Reticular degeneration Attached, mild Reticular degeneration          IMAGING AND PROCEDURES  Imaging and Procedures for 03/21/18  OCT, Retina - OU - Both Eyes       Right Eye Quality was good. Central Foveal Thickness: 548. Progression has been stable. Findings include pigment epithelial detachment, epiretinal membrane, subretinal hyper-reflective material, retinal drusen , abnormal foveal contour, no SRF, intraretinal fluid, disciform scar, outer retinal tubulation, outer retinal atrophy (Stable sub-retinal scar, mild persistent cystic changes overlying).   Left Eye Quality was good. Central Foveal Thickness: 438. Progression has improved. Findings include subretinal hyper-reflective material, pigment epithelial detachment, intraretinal fluid, retinal drusen , abnormal foveal contour, outer retinal atrophy, subretinal fluid (Mild interval improvement in cystic changes overlying persistent PED / [facility]).   Notes *Images captured and stored on drive  Diagnosis / Impression:  Exudative ARMD OU OD: Stable sub-retinal scar, mild persistent cystic changes overlying OS: Mild interval improvement in cystic changes overlying persistent PED / [facility]  Clinical management:  See below  Abbreviations: NFP - Normal foveal profile. CME - cystoid macular edema. PED - pigment epithelial detachment. IRF - intraretinal fluid. SRF - subretinal fluid. EZ - ellipsoid zone. ERM - epiretinal membrane. ORA - outer retinal atrophy. ORT - outer retinal tubulation. SRHM - subretinal hyper-reflective material        Intravitreal Injection, Pharmacologic Agent - OS - Left Eye       Time Out 04/01/2021. 8:31 AM. Confirmed  correct patient, procedure, site, and patient consented.    Anesthesia Topical anesthesia was used. Anesthetic medications included Lidocaine 2%, Proparacaine 0.5%.   Procedure Preparation included 5% betadine to ocular surface, eyelid speculum. A (33g) needle was used.   Injection:  2 mg aflibercept Alfonse Flavors) SOLN   NDC: A3590391, Lot: [id number], Expiration date: 11/04/2021   Route: Intravitreal, Site: Left Eye, Waste: 0.05 mL  Post-op Post injection exam found visual acuity of at least counting fingers. The patient tolerated the procedure well. There were no complications. The patient received written and verbal post procedure care education. Post injection medications were not given.                 ASSESSMENT/PLAN:    ICD-10-CM   1. Exudative age-related macular degeneration of both eyes with active choroidal neovascularization (HCC)  H35.3231 Intravitreal Injection, Pharmacologic Agent - OS - Left Eye    aflibercept (EYLEA) SOLN 2 mg  2. Retinal edema  H35.81 OCT, Retina - OU - Both Eyes  3. Posterior vitreous detachment of both eyes  H43.813   4. Pseudophakia of both eyes  Z96.1   5. Ocular hypertension, bilateral  H40.053   6. Combined form of age-related cataract, both eyes  H25.813     1,2. Exudative age related macular degeneration, both eyes.    - severe exudative disease with very active CNVM OU at presentation in January 2019  - S/P IVA OD #1 (01.04.19), #2 (02.15.19)  - S/P IVA OS #1 (01.18.19), #2 (02.15.19)  - switched to Eylea 3.18.19 due to severity of disease  - S/P IVE OD #1 (03.18.19), #2 (04.17.19), #3 (05.15.19), #4 (10.02.19),  #5 (01.15.20), #6 (06.04.20), #7 (10.29.20), #8 (01.21.21), #9 (05.27.21), #10 (07.22.21) -- injections held due to stable disciform scar  - S/P IVE OS #1 (03.18.19), #2 (04.17.19), #3 (05.15.19), #4 (06.12.19), #5 (07.10.19), #6 (08.07.19), #7 (09.04.19), #8 (10.02.19), #9 (11.06.19), #10 (12.11.19), #11 (01.15.20), #12 (02.20.20), #13 (03.26.20), #14 (04.30.20), #15 (06.04.20),  #16 (07.16.20), # 17 (08.20.20), #18 (09.24.20), #19 (10.29.20), #20 (12.03.20), #21 (01.21.21), #22 (03.18.21), #23 (05.27.21), #24 (07.22.21), #25 (11.11.21), #26 (12.09.21), #27 (01.06.22), #28 (2.7.22), #29 (03.24.22)  - IVE OS held from 7.22.21 to 11.11.21 due to TIA/stroke on 8.1.21  - OCT OS today interval improvement in IRF/cystic changes overlying PED / [facility]  - exam OS shows macular heme improved  - OD with significant subretinal disciform scar - tr persistent cystic changes overlying IRHM  - BCVA OD stable at CF 3'; OS stable at 20/100   - recommend IVE OS #30 today, 04.28.22 -- will cont to hold OD  - Eylea informed consent form signed and scanned on 01.21.2021  - Eylea4U paperwork filled out on 02.15.19 and fully approved through [employer] -- approved  - f/u in 5 weeks, sooner prn for DFE/OCT/possible injection(s)  3. PVD / vitreous syneresis  - Discussed findings and prognosis   - No RT or RD on 360  exam  - Reviewed s/s of RT/RD  - Strict return precautions for any such RT/RD signs/symptoms  4. Pseudophakia OU  - s/p CE/IOL (Dr. Marisa Hua, 11.2020)  - beautiful surgeries w/ IOLs in excellent position, doing well  - monitor  5. Ocular Hypertension OU  - IOP improved today --  18,15  - cont Cosopt qd OU   Ophthalmic Meds Ordered this visit:  Meds ordered this encounter  Medications  . aflibercept (EYLEA) SOLN 2 mg       Return in about 5  weeks (around 05/06/2021) for f/u exu ARMD OU, DFE, OCT.  There are no Patient Instructions on file for this visit.  This document serves as a record of services personally performed by Gardiner Sleeper, MD, PhD. It was created on their behalf by Leeann Must, [place], an ophthalmic technician. The creation of this record is the provider's dictation and/or activities during the visit.    Electronically signed by: Leeann Must, COA @TODAY @ 5:28 PM  This document serves as a record of services personally performed by Gardiner Sleeper,  MD, PhD. It was created on their behalf by San Jetty. Owens Shark, OA an ophthalmic technician. The creation of this record is the provider's dictation and/or activities during the visit.    Electronically signed by: San Jetty. Owens Shark, [place] 04.28.2022 5:28 PM  Gardiner Sleeper, M.D., Ph.D. Diseases & Surgery of the Retina and [place] 04/01/2021   I have reviewed the above documentation for accuracy and completeness, and I agree with the above. Gardiner Sleeper, M.D., Ph.D. 04/01/21 5:28 PM    Abbreviations: M myopia (nearsighted); A astigmatism; H hyperopia (farsighted); P presbyopia; Mrx spectacle prescription;  CTL contact lenses; OD right eye; OS left eye; OU both eyes  XT exotropia; ET esotropia; PEK punctate epithelial keratitis; PEE punctate epithelial erosions; DES dry eye syndrome; MGD meibomian gland dysfunction; ATs artificial tears; PFAT's preservative free artificial tears; [place] nuclear sclerotic cataract; PSC posterior subcapsular cataract; ERM epi-retinal membrane; PVD posterior vitreous detachment; RD retinal detachment; DM diabetes mellitus; DR diabetic retinopathy; NPDR non-proliferative diabetic retinopathy; PDR proliferative diabetic retinopathy; CSME clinically significant macular edema; DME diabetic macular edema; dbh dot blot hemorrhages; CWS cotton wool spot; POAG primary open angle glaucoma; C/D cup-to-disc ratio; HVF humphrey visual field; GVF goldmann visual field; OCT optical coherence tomography; IOP intraocular pressure; BRVO Branch retinal vein occlusion; CRVO central retinal vein occlusion; CRAO central retinal artery occlusion; BRAO branch retinal artery occlusion; RT retinal tear; SB scleral buckle; PPV pars plana vitrectomy; VH Vitreous hemorrhage; PRP panretinal laser photocoagulation; IVK intravitreal kenalog; VMT vitreomacular traction; MH Macular hole;  NVD neovascularization of the disc; NVE neovascularization elsewhere; AREDS age related eye  disease study; ARMD age related macular degeneration; POAG primary open angle glaucoma; EBMD epithelial/anterior basement membrane dystrophy; ACIOL anterior chamber intraocular lens; IOL intraocular lens; PCIOL posterior chamber intraocular lens; Phaco/IOL phacoemulsification with intraocular lens placement; [place] photorefractive keratectomy; LASIK laser assisted in situ keratomileusis; HTN hypertension; DM diabetes mellitus; COPD chronic obstructive pulmonary disease

## 2021-03-30 ENCOUNTER — Other Ambulatory Visit: Payer: Self-pay

## 2021-03-30 ENCOUNTER — Inpatient Hospital Stay (HOSPITAL_COMMUNITY): Payer: Medicare HMO

## 2021-03-30 ENCOUNTER — Inpatient Hospital Stay (HOSPITAL_BASED_OUTPATIENT_CLINIC_OR_DEPARTMENT_OTHER): Payer: Medicare HMO | Admitting: Hematology

## 2021-03-30 ENCOUNTER — Encounter (HOSPITAL_COMMUNITY): Payer: Self-pay | Admitting: Hematology

## 2021-03-30 VITALS — BP 154/69 | HR 78 | Temp 97.0°F | Resp 18 | Wt 160.7 lb

## 2021-03-30 VITALS — BP 139/74 | HR 70 | Temp 97.0°F | Resp 18

## 2021-03-30 DIAGNOSIS — C182 Malignant neoplasm of ascending colon: Secondary | ICD-10-CM

## 2021-03-30 DIAGNOSIS — Z5112 Encounter for antineoplastic immunotherapy: Secondary | ICD-10-CM | POA: Diagnosis not present

## 2021-03-30 DIAGNOSIS — C189 Malignant neoplasm of colon, unspecified: Secondary | ICD-10-CM

## 2021-03-30 LAB — COMPREHENSIVE METABOLIC PANEL
ALT: 26 U/L (ref 0–44)
AST: 30 U/L (ref 15–41)
Albumin: 4.1 g/dL (ref 3.5–5.0)
Alkaline Phosphatase: 59 U/L (ref 38–126)
Anion gap: 10 (ref 5–15)
BUN: 29 mg/dL — ABNORMAL HIGH (ref 8–23)
CO2: 29 mmol/L (ref 22–32)
Calcium: 9.7 mg/dL (ref 8.9–10.3)
Chloride: 98 mmol/L (ref 98–111)
Creatinine, Ser: 0.95 mg/dL (ref 0.44–1.00)
GFR, Estimated: >60 mL/min (ref >60)
Glucose, Bld: 92 mg/dL (ref 70–99)
Potassium: 3.3 mmol/L — ABNORMAL LOW (ref 3.5–5.1)
Sodium: 137 mmol/L (ref 135–145)
Total Bilirubin: 0.6 mg/dL (ref 0.3–1.2)
Total Protein: 7.4 g/dL (ref 6.5–8.1)

## 2021-03-30 LAB — CBC WITH DIFFERENTIAL/PLATELET
Abs Immature Granulocytes: 0.02 10*3/uL (ref 0.00–0.07)
Basophils Absolute: 0 10*3/uL (ref 0.0–0.1)
Basophils Relative: 1 %
Eosinophils Absolute: 0.3 10*3/uL (ref 0.0–0.5)
Eosinophils Relative: 5 %
HCT: 37.8 % (ref 36.0–46.0)
Hemoglobin: 12.3 g/dL (ref 12.0–15.0)
Immature Granulocytes: 0 %
Lymphocytes Relative: 23 %
Lymphs Abs: 1.4 10*3/uL (ref 0.7–4.0)
MCH: 31 pg (ref 26.0–34.0)
MCHC: 32.5 g/dL (ref 30.0–36.0)
MCV: 95.2 fL (ref 80.0–100.0)
Monocytes Absolute: 0.7 10*3/uL (ref 0.1–1.0)
Monocytes Relative: 12 %
Neutro Abs: 3.8 10*3/uL (ref 1.7–7.7)
Neutrophils Relative %: 59 %
Platelets: 136 10*3/uL — ABNORMAL LOW (ref 150–400)
RBC: 3.97 MIL/uL (ref 3.87–5.11)
RDW: 13.3 % (ref 11.5–15.5)
WBC: 6.3 10*3/uL (ref 4.0–10.5)
nRBC: 0 % (ref 0.0–0.2)

## 2021-03-30 LAB — MAGNESIUM: Magnesium: 1.9 mg/dL (ref 1.7–2.4)

## 2021-03-30 MED ORDER — SODIUM CHLORIDE 0.9 % IV SOLN: Freq: Once | INTRAVENOUS | Status: AC

## 2021-03-30 MED ORDER — HEPARIN SOD (PORK) LOCK FLUSH 100 UNIT/ML IV SOLN
500.0000 [IU] | Freq: Once | INTRAVENOUS | Status: AC | PRN
  Administered 2021-03-30: 500 [IU]

## 2021-03-30 MED ORDER — SODIUM CHLORIDE 0.9 % IV SOLN
200.0000 mg | Freq: Once | INTRAVENOUS | Status: AC
  Administered 2021-03-30: 200 mg via INTRAVENOUS
  Filled 2021-03-30: qty 8

## 2021-03-30 MED ORDER — SODIUM CHLORIDE 0.9% FLUSH
10.0000 mL | INTRAVENOUS | Status: DC | PRN
  Administered 2021-03-30: 10 mL

## 2021-03-30 NOTE — Progress Notes (Signed)
Patients port flushed without difficulty.  No blood return noted no bruising or swelling noted at site.  Patient complains of no pain and states that she can taste the saline. Patient remains accessed for treatment.

## 2021-03-30 NOTE — Progress Notes (Signed)
Gabrielle [name], [place] [address]   CLINIC:  Medical Oncology/Hematology  PCP:  Janora Norlander, DO [address] [place] [address] [phone]   REASON FOR VISIT:  Follow-up for right colon cancer  PRIOR THERAPY:  1. Right hemicolectomy on 04/08/2020. 2. FOLFOX x 3 cycles from 05/19/2020 to 07/14/2020.  NGS Results: Foundation 1 KRAS/NRAS wild-type, MSI--high, TMB 55 Muts/Mb, BRAF V 600 E  CURRENT THERAPY: Keytruda every 3 weeks  BRIEF ONCOLOGIC HISTORY:  Oncology History  Malignant neoplasm of colon ([place])  04/07/2020 Initial Diagnosis   Malignant neoplasm of ascending colon ([place])   05/13/2020 Genetic Testing   Foundation One     05/19/2020 - 07/02/2020 Chemotherapy   The patient had palonosetron (ALOXI) injection 0.25 mg, 0.25 mg, Intravenous,  Once, 2 of 12 cycles Administration: 0.25 mg (05/19/2020), 0.25 mg (06/30/2020) leucovorin 522 mg in dextrose 5 % 250 mL infusion, 320 mg/m2 = 522 mg (80 % of original dose 400 mg/m2), Intravenous,  Once, 2 of 12 cycles Dose modification: 320 mg/m2 (80 % of original dose 400 mg/m2, Cycle 1, Reason: Provider Judgment), 309.4076 mg/m2 (66.7 % of original dose 400 mg/m2, Cycle 2, Reason: Provider Judgment) Administration: 522 mg (05/19/2020), 434 mg (06/30/2020) oxaliplatin (ELOXATIN) 110 mg in dextrose 5 % 500 mL chemo infusion, 68 mg/m2 = 110 mg (80 % of original dose 85 mg/m2), Intravenous,  Once, 1 of 1 cycle Dose modification: 68 mg/m2 (80 % of original dose 85 mg/m2, Cycle 1, Reason: Provider Judgment) Administration: 110 mg (05/19/2020) fluorouracil (ADRUCIL) chemo injection 500 mg, 320 mg/m2 = 500 mg (80 % of original dose 400 mg/m2), Intravenous,  Once, 2 of 12 cycles Dose modification: 320 mg/m2 (80 % of original dose 400 mg/m2, Cycle 1, Reason: Provider Judgment), 808.8110 mg/m2 (66.7 % of original dose 400 mg/m2, Cycle 2, Reason: Provider Judgment) Administration: 500 mg (05/19/2020), 450  mg (06/30/2020) fluorouracil (ADRUCIL) 3,150 mg in sodium chloride 0.9 % 87 mL chemo infusion, 1,920 mg/m2 = 3,150 mg (80 % of original dose 2,400 mg/m2), Intravenous, 1 Day/Dose, 2 of 12 cycles Dose modification: 1,920 mg/m2 (80 % of original dose 2,400 mg/m2, Cycle 1, Reason: Provider Judgment), 1,600 mg/m2 (66.7 % of original dose 2,400 mg/m2, Cycle 2, Reason: Provider Judgment) Administration: 3,150 mg (05/19/2020), 2,600 mg (06/30/2020)  for chemotherapy treatment.    08/03/2020 Genetic Testing   Guardant Reveal Testing     08/14/2020 Genetic Testing   No pathogenic variants detected in [facility] Common Hereditary Cancers Panel.  Variant of uncertain significance (VUS) detected in HOXB13 at c.634G>A (p.Ala212Thr). The Common Hereditary Cancers Panel offered by [facility] includes sequencing and/or deletion duplication testing of the following 48 genes: APC, ATM, AXIN2, BARD1, BMPR1A, BRCA1, BRCA2, BRIP1, CDH1, CDK4, CDKN2A (p14ARF), CDKN2A (p16INK4a), CHEK2, CTNNA1, DICER1, EPCAM (Deletion/duplication testing only), FLCN, GREM1 (promoter region deletion/duplication testing only), KIT, MEN1, MLH1, MSH2, MSH3, MSH6, MUTYH, NBN, NF1, NHTL1, PALB2, PDGFRA, PMS2, POLD1, POLE, PTEN, RAD50, RAD51C, RAD51D, RNF43, SDHB, SDHC, SDHD, SMAD4, SMARCA4. STK11, TP53, TSC1, TSC2, and VHL.  The following genes were evaluated for sequence changes only: SDHA and HOXB13 c.251G>A variant only. The report date is August 14, 2020.    11/05/2020 Cancer Staging   Staging form: Colon and Rectum, AJCC 8th Edition - Pathologic stage from 11/05/2020: Stage IVC (rpTX, pN0, pM1c) - Signed by Derek Jack, MD on 11/05/2020   11/10/2020 -  Chemotherapy    Patient is on Treatment Plan: COLORECTAL PEMBROLIZUMAB Q21D  CANCER STAGING: Cancer Staging Malignant neoplasm of colon [facility]) Staging form: Colon and Rectum, AJCC 8th Edition - Clinical stage from 04/27/2020: Stage IIIB (cT4a, cN1a, cM0) - Unsigned - Pathologic  stage from 11/05/2020: Stage IVC (rpTX, pN0, pM1c) - Signed by Derek Jack, MD on 11/05/2020   INTERVAL HISTORY:  [name], a 75 y.o. female, returns for routine follow-up and consideration for next cycle of immunotherapy. Tavaria was last seen on 03/08/2021.  Due for cycle #6 of Keytruda today.   Overall, she tells me she has been feeling pretty well. She tolerated the previous treatment well and denies having N/V/D or skin rash. She reports having teary eyes which she attributes to pollen and is putting Vyzine drops. She is taking potassium.  She is scheduled to follow up with Dr. Coralyn Pear on 04/28.  Overall, she feels ready for next cycle of immunotherapy today.    REVIEW OF SYSTEMS:  Review of Systems  Constitutional: Negative for appetite change and fatigue.  Gastrointestinal: Negative for diarrhea, nausea and vomiting.  Skin: Negative for rash.  All other systems reviewed and are negative.   PAST MEDICAL/SURGICAL HISTORY:  Past Medical History:  Diagnosis Date  . Arthritis   . Cancer ([place])   . Cataract    OU  . Colon cancer ([place])   . Family history of pancreatic cancer 08/21/2020  . Family history of uterine cancer 08/21/2020  . H/O cesarean section   . Hx of tonsillectomy   . Hyperlipidemia   . Hypertension   . Macular degeneration    Exu ARMD OU  . Paroxysmal atrial fibrillation ([place]) 07/05/2020   Past Surgical History:  Procedure Laterality Date  . BIOPSY  04/06/2020   Procedure: BIOPSY;  Surgeon: Rogene Houston, MD;  Location: [facility];  Service: Endoscopy;;  . CARPAL TUNNEL RELEASE Right   . CATARACT EXTRACTION W/PHACO Left 10/11/2019   Procedure: CATARACT EXTRACTION PHACO AND INTRAOCULAR LENS PLACEMENT (IOC);  Surgeon: Baruch Goldmann, MD;  Location: [facility] ORS;  Service: Ophthalmology;  Laterality: Left;  CDE: 8.56  . CATARACT EXTRACTION W/PHACO Right 10/25/2019   Procedure: CATARACT EXTRACTION PHACO AND INTRAOCULAR LENS PLACEMENT (IOC);   Surgeon: Baruch Goldmann, MD;  Location: [facility] ORS;  Service: Ophthalmology;  Laterality: Right;  CDE: 5.67  . CESAREAN SECTION    . COLONOSCOPY N/A 04/06/2020   Procedure: COLONOSCOPY;  Surgeon: Rogene Houston, MD;  Location: [facility];  Service: Endoscopy;  Laterality: N/A;  . CYSTOSCOPY WITH BIOPSY N/A 04/08/2020   Procedure: CYSTOSCOPY WITH BIOPSY;  Surgeon: Cleon Gustin, MD;  Location: [facility] ORS;  Service: Urology;  Laterality: N/A;  . ESOPHAGOGASTRODUODENOSCOPY N/A 04/05/2020   Procedure: ESOPHAGOGASTRODUODENOSCOPY (EGD);  Surgeon: Rogene Houston, MD;  Location: [facility];  Service: Endoscopy;  Laterality: N/A;  . PARTIAL COLECTOMY N/A 04/08/2020   Procedure: PARTIAL COLECTOMY;  Surgeon: Virl Cagey, MD;  Location: [facility] ORS;  Service: General;  Laterality: N/A;  . PORTACATH PLACEMENT Left 05/18/2020   Procedure: INSERTION PORT-A-CATH (ATTACHED CATHETER IN LEFT SUBCLAVIAN);  Surgeon: Virl Cagey, MD;  Location: [facility] OR[facility];  Service: General;  Laterality: Left;  . TONSILLECTOMY      SOCIAL HISTORY:  Social History   Socioeconomic History  . Marital status: Divorced    Spouse name: Not on file  . Number of children: Not on file  . Years of education: Not on file  . Highest education level: Not on file  Occupational History  . Not on file  Tobacco Use  . Smoking status: Never Smoker  . Smokeless tobacco: Never Used  Vaping Use  . Vaping Use: Never used  Substance and Sexual Activity  . Alcohol use: No  . Drug use: No  . Sexual activity: Not Currently  Other Topics Concern  . Not on file  Social History Narrative  . Not on file   Social Determinants of Health   Financial Resource Strain: Medium Risk  . Difficulty of Paying Living Expenses: Somewhat hard  Food Insecurity: Food Insecurity Present  . Worried About Charity fundraiser in the Last Year: Sometimes true  . Ran Out of Food in the Last Year: Never true  Transportation Needs: Unmet Transportation  Needs  . Lack of Transportation (Medical): Yes  . Lack of Transportation (Non-Medical): Yes  Physical Activity: Insufficiently Active  . Days of Exercise per Week: 7 days  . Minutes of Exercise per Session: 20 min  Stress: Stress Concern Present  . Feeling of Stress : Very much  Social Connections: Moderately Integrated  . Frequency of Communication with Friends and Family: More than three times a week  . Frequency of Social Gatherings with Friends and Family: Once a week  . Attends Religious Services: More than 4 times per year  . Active Member of Clubs or Organizations: Yes  . Attends Archivist Meetings: Never  . Marital Status: Divorced  Human resources officer Violence: Not At Risk  . Fear of Current or Ex-Partner: No  . Emotionally Abused: No  . Physically Abused: No  . Sexually Abused: No    FAMILY HISTORY:  Family History  Problem Relation Age of Onset  . Macular degeneration Mother   . Stroke Mother   . Hypertension Mother   . Dementia Mother   . Lung cancer Father        dx late 1s; smoking hx  . Diabetes Sister   . Hypertension Sister   . Leukemia Paternal Uncle        d. 64s  . Cancer Maternal Aunt        ovarian or endometrial dx 77s  . Pancreatic cancer Paternal Uncle        d. late 55s    CURRENT MEDICATIONS:  Current Outpatient Medications  Medication Sig Dispense Refill  . alendronate (FOSAMAX) 70 MG tablet Take 1 tablet (70 mg total) by mouth every 7 (seven) days. Take with a full glass of water on an empty stomach. 4 tablet 11  . amLODipine (NORVASC) 5 MG tablet Take 1 tablet (5 mg total) by mouth daily. 90 tablet 3  . atorvastatin (LIPITOR) 20 MG tablet Take 1 tablet (20 mg total) by mouth daily. (NEEDS TO BE SEEN BEFORE NEXT REFILL) 30 tablet 0  . cholecalciferol (VITAMIN D3) 25 MCG (1000 UT) tablet Take 1,000 Units by mouth daily.    Marland Kitchen CRANBERRY FRUIT PO Take 1 tablet by mouth daily.    . diclofenac Sodium (VOLTAREN) 1 % GEL Apply 2 g  topically 4 (four) times daily.     . dorzolamide-timolol (COSOPT) 22.3-6.8 MG/ML ophthalmic solution PLACE 1 DROP INTO BOTH EYES 2 TIMES A DAY (Patient taking differently: Place 1 drop into both eyes 1 day or 1 dose.) 10 mL 0  . ELIQUIS 5 MG TABS tablet Take 5 mg by mouth 2 (two) times daily.    . FEROSUL 325 (65 Fe) MG tablet TAKE  (1)  TABLET TWICE A DAY WITH MEALS (BREAKFAST AND SUPPER) 180 tablet 0  .  Garlic 638 MG TABS Take by mouth.    . LEUCOVORIN CALCIUM IV Inject into the vein every 14 (fourteen) days.     Marland Kitchen lidocaine-prilocaine (EMLA) cream Apply a small amount to port a cath site and cover with plastic wrap 1 hour prior to chemotherapy appointments 30 g 0  . losartan-hydrochlorothiazide (HYZAAR) 100-25 MG tablet Take 1 tablet by mouth daily.    . OXALIPLATIN IV Inject into the vein every 14 (fourteen) days.     . potassium chloride SA (KLOR-CON) 20 MEQ tablet Take 1 tablet (20 mEq total) by mouth daily. 90 tablet 1  . prochlorperazine (COMPAZINE) 10 MG tablet Take 1 tablet (10 mg total) by mouth every 6 (six) hours as needed for nausea or vomiting. (Patient not taking: Reported on 03/08/2021) 30 tablet 0   No current facility-administered medications for this visit.   Facility-Administered Medications Ordered in Other Visits  Medication Dose Route Frequency Provider Last Rate Last Admin  . 0.9 %  sodium chloride infusion   Intravenous Once Derek Jack, MD      . heparin lock flush 100 unit/mL  500 Units Intracatheter Once PRN Derek Jack, MD      . pembrolizumab [facility]) 200 mg in sodium chloride 0.9 % 50 mL chemo infusion  200 mg Intravenous Once Derek Jack, MD      . sodium chloride flush (NS) 0.9 % injection 10 mL  10 mL Intracatheter PRN Derek Jack, MD        ALLERGIES:  Allergies  Allergen Reactions  . Lisinopril Cough    PHYSICAL EXAM:  Performance status (ECOG): 1 - Symptomatic but completely ambulatory  Vitals:   03/30/21 1201   BP: (!) 154/69  Pulse: 78  Resp: 18  Temp: (!) 97 F (36.1 C)  SpO2: 96%   Wt Readings from Last 3 Encounters:  03/30/21 160 lb 11.5 oz (72.9 kg)  03/08/21 156 lb 8.4 oz (71 kg)  02/10/21 153 lb 3.2 oz (69.5 kg)   Physical Exam Vitals reviewed.  Constitutional:      Appearance: Normal appearance.  Eyes:     Conjunctiva/sclera:     Right eye: Right conjunctiva is injected.     Left eye: Left conjunctiva is injected.  Cardiovascular:     Rate and Rhythm: Normal rate and regular rhythm.     Pulses: Normal pulses.     Heart sounds: Normal heart sounds.  Pulmonary:     Effort: Pulmonary effort is normal.     Breath sounds: Normal breath sounds.  Chest:     Comments: Port-a-Cath in L chest Abdominal:     Palpations: Abdomen is soft. There is no mass.     Tenderness: There is no abdominal tenderness.  Musculoskeletal:     Right lower leg: No edema.     Left lower leg: No edema.  Skin:    Findings: No rash.  Neurological:     General: No focal deficit present.     Mental Status: She is alert and oriented to person, place, and time.  Psychiatric:        Mood and Affect: Mood normal.        Behavior: Behavior normal.     LABORATORY DATA:  I have reviewed the labs as listed.  CBC Latest Ref Rng & Units 03/30/2021 03/08/2021 02/09/2021  WBC 4.0 - 10.5 K/uL 6.3 6.0 4.7  Hemoglobin 12.0 - 15.0 g/dL 12.3 12.7 12.6  Hematocrit 36.0 - 46.0 % 37.8 38.6 38.7  Platelets  150 - 400 K/uL 136(L) 133(L) 138(L)   CMP Latest Ref Rng & Units 03/30/2021 03/08/2021 02/09/2021  Glucose 70 - 99 mg/dL 92 94 102(H)  BUN 8 - 23 mg/dL 29(H) 27(H) 24(H)  Creatinine 0.44 - 1.00 mg/dL 0.95 1.05(H) 1.09(H)  Sodium 135 - 145 mmol/L 137 137 138  Potassium 3.5 - 5.1 mmol/L 3.3(L) 3.6 3.2(L)  Chloride 98 - 111 mmol/L 98 97(L) 98  CO2 22 - 32 mmol/L _0 Calcium 8.9 - 10.3 mg/dL 9.7 10.0 9.9  Total Protein 6.5 - 8.1 g/dL 7.4 7.8 7.8  Total Bilirubin 0.3 - 1.2 mg/dL 0.6 0.7 0.6  Alkaline Phos 38 -  126 U/L 59 75 57  AST 15 - 41 U/L _1 ALT 0 - 44 U/L _2 Lab Results  Component Value Date   CEA1 1.3 03/08/2021   CEA1 1.6 01/18/2021   CEA1 5.9 (H) 11/05/2020    DIAGNOSTIC IMAGING:  I have independently reviewed the scans and discussed with the patient. No results found.   ASSESSMENT:  1. Stage IIIb (T4AN1A) poorly differentiated right colon adenocarcinoma: -Right hemicolectomy on 04/07/2020 with poorly differentiated adenocarcinoma, pT4a, positive radial margin, 1/15 lymph nodes positive, loss of MLH1 and PMS2, bladder biopsy negative for malignancy. -CTAP on 04/06/2020 showed circumferential ascending colon mass with numerous borderline enlarged pericolonic lymph nodes. No findings of hepatic metastatic disease. -CEA on 04/04/2020 was 12.2. CEA improved to 1.7 on 05/13/2020. -PET scan on 05/11/2020 shows mild FDG uptake associated with the anastomotic site. Small pulmonary nodules that do not show elevated FDG activity, remain nonspecific, subcentimeter. Persistent but improved bladder wall thickening. -Cycle 1 of dose reduced FOLFOX on 05/19/2020, followed by 2 hospitalizations, 1 from acute kidney injury from diarrhea and second admission for C. difficile colitis. -Cycle 2 of 5-FU and leucovorin dose reduced on 06/30/2020.Chemotherapy discontinued secondary to intolerance. -Follow-up CT scan on 11/02/2020 with multiple soft tissue lesions in the central and right mesentery measuring up to 3.1 x 1.7 cm. Mild lymphadenopathy in the hepatic duodenal ligament, retroperitoneal space, right common iliac chain, bilateral external iliac chains. 17 mm soft tissue nodule in the lower anterior abdominal wall. Bilateral tiny pulmonary nodules not substantially changed. 2.9 x 2.5 cm low-density lesion along the posterior uterus is indeterminate. -Pembrolizumab started on 11/10/2020. -CT CAP from 02/09/2021 showed interval near complete resolution of previously demonstrated nodal mass in  the ileocolonic mesentery.  Residual ill-defined nodule medial to the tip of the right hepatic lobe.  Stable small pulmonary nodules bilaterally consistent with benign findings.  2. Family history: -Paternal uncle had colon cancer. Maternal aunt had brain cancer and another maternal aunt had gynecological malignancy. Father had lung cancer and was a smoker. -Genetic testing did not reveal any notable mutations.  3. Diffuse erythema and nodularity of the dome of the bladder: -CT scan showed severely thickened bladder wall. -Cystoscopy on 04/08/2020 showed diffuse erythema, nodularity in the posterior wall tracking to the dome. Ureteral orifices were in normal locations. Biopsies were benign.   PLAN:  1.Stage IV right colon adenocarcinoma, MSI-high: -She has tolerated last cycle without any major problems. - She continues to have red eyes with tearing.  She is being seen by Dr. Coralyn Pear.  She has an appointment to see me in 2 days. - She did not have any immunotherapy related side effects like diarrhea and skin rashes. - Reviewed her labs today which showed normal LFTs.  TSH was 2.29. - Last CEA  was 1.13. - Recommend proceeding with Keytruda today and in 3 weeks.  RTC 6 weeks. - Plan to recommend repeat CT CAP and CEA level prior to next visit.  2.Paroxysmal atrial fibrillation: -Continue Eliquis.  No bleeding issues. - Watchman procedure at [facility] will be done in May.  3.Hypertension: -Continue Hyzaar and amlodipine.   Orders placed this encounter:  Orders Placed This Encounter  Procedures  . TSH     Derek Jack, MD [place] [phone]   I, Milinda Antis, am acting as a scribe for Dr. Sanda Linger.  I, Derek Jack MD, have reviewed the above documentation for accuracy and completeness, and I agree with the above.

## 2021-03-30 NOTE — Progress Notes (Signed)
Patient presents today for D1 Cycle 6 Keytruda infusion.  Vital signs within parameters for treatment.  Labs pending.  Patient has no new complaints at this time.  Labs reviewed and within parameters for treatment.  Potassium noted to be 3.3.  Message received from Renda Rolls, RN/Dr. Delton Coombes patient okay for treatment.  Keytruda infusion given today per MD orders.  Tolerated infusion without adverse affects.  Vital signs stable.  No complaints at this time.  Discharge from clinic ambulatory in stable condition.  Alert and oriented X 3.  Follow up with [facility] as scheduled.

## 2021-03-30 NOTE — Patient Instructions (Signed)
[facility]  Discharge Instructions: Thank you for choosing [facility] to provide your oncology and hematology care.  If you have a lab appointment with the Cancer Center, please come in thru the Main Entrance and check in at the main information desk.  Wear comfortable clothing and clothing appropriate for easy access to any Portacath or PICC line.   We strive to give you quality time with your provider. You may need to reschedule your appointment if you arrive late (15 or more minutes).  Arriving late affects you and other patients whose appointments are after yours.  Also, if you miss three or more appointments without notifying the office, you may be dismissed from the clinic at the provider's discretion.      For prescription refill requests, have your pharmacy contact our office and allow 72 hours for refills to be completed.    Today you received the following chemotherapy and/or immunotherapy agents Keytruda       To help prevent nausea and vomiting after your treatment, we encourage you to take your nausea medication as directed.  BELOW ARE SYMPTOMS THAT SHOULD BE REPORTED IMMEDIATELY: *FEVER GREATER THAN 100.4 F (38 C) OR HIGHER *CHILLS OR SWEATING *NAUSEA AND VOMITING THAT IS NOT CONTROLLED WITH YOUR NAUSEA MEDICATION *UNUSUAL SHORTNESS OF BREATH *UNUSUAL BRUISING OR BLEEDING *URINARY PROBLEMS (pain or burning when urinating, or frequent urination) *BOWEL PROBLEMS (unusual diarrhea, constipation, pain near the anus) TENDERNESS IN MOUTH AND THROAT WITH OR WITHOUT PRESENCE OF ULCERS (sore throat, sores in mouth, or a toothache) UNUSUAL RASH, SWELLING OR PAIN  UNUSUAL VAGINAL DISCHARGE OR ITCHING   Items with * indicate a potential emergency and should be followed up as soon as possible or go to the Emergency Department if any problems should occur.  Please show the CHEMOTHERAPY ALERT CARD or IMMUNOTHERAPY ALERT CARD at check-in to the Emergency  Department and triage nurse.  Should you have questions after your visit or need to cancel or reschedule your appointment, please contact [facility] [phone]  and follow the prompts.  Office hours are 8:00 a.m. to 4:30 p.m. Monday - Friday. Please note that voicemails left after 4:00 p.m. may not be returned until the following business day.  We are closed weekends and major holidays. You have access to a nurse at all times for urgent questions. Please call the main number to the clinic [phone] and follow the prompts.  For any non-urgent questions, you may also contact your provider using MyChart. We now offer e-Visits for anyone 18 and older to request care online for non-urgent symptoms. For details visit [email or web address].   Also download the MyChart app! Go to the app store, search "MyChart", open the app, select [facility], and log in with your MyChart username and password.  Due to Covid, a mask is required upon entering the hospital/clinic. If you do not have a mask, one will be given to you upon arrival. For doctor visits, patients may have 1 support person aged 18 or older with them. For treatment visits, patients cannot have anyone with them due to current Covid guidelines and our immunocompromised population.  

## 2021-03-30 NOTE — Patient Instructions (Signed)
[place] at [facility] Discharge Instructions  You were seen today by Dr. Delton Coombes. He went over your recent results. You received your treatment today; continue getting your treatment every 3 weeks. You will be scheduled to have a CT scan of your chest and abdomen done before your next visit.  Dr. Delton Coombes will see you back in 6 weeks for labs and follow up.   Thank you for choosing [place] at [facility] to provide your oncology and hematology care.  To afford each patient quality time with our provider, please arrive at least 15 minutes before your scheduled appointment time.   If you have a lab appointment with the [facility] please come in thru the Main Entrance and check in at the main information desk  You need to re-schedule your appointment should you arrive 10 or more minutes late.  We strive to give you quality time with our providers, and arriving late affects you and other patients whose appointments are after yours.  Also, if you no show three or more times for appointments you may be dismissed from the clinic at the providers discretion.     Again, thank you for choosing [facility].  Our hope is that these requests will decrease the amount of time that you wait before being seen by our physicians.       _____________________________________________________________  Should you have questions after your visit to [facility], please contact our office at [phone] between the hours of 8:00 a.m. and 4:30 p.m.  Voicemails left after 4:00 p.m. will not be returned until the following business day.  For prescription refill requests, have your pharmacy contact our office and allow 72 hours.    Cancer Center Support Programs:   > Cancer Support Group  2nd Tuesday of the month 1pm-2pm, Journey Room

## 2021-03-31 ENCOUNTER — Encounter (INDEPENDENT_AMBULATORY_CARE_PROVIDER_SITE_OTHER): Payer: Self-pay | Admitting: *Deleted

## 2021-04-01 ENCOUNTER — Encounter (INDEPENDENT_AMBULATORY_CARE_PROVIDER_SITE_OTHER): Payer: Self-pay | Admitting: Ophthalmology

## 2021-04-01 ENCOUNTER — Other Ambulatory Visit: Payer: Self-pay

## 2021-04-01 ENCOUNTER — Ambulatory Visit (INDEPENDENT_AMBULATORY_CARE_PROVIDER_SITE_OTHER): Payer: Medicare HMO | Admitting: Ophthalmology

## 2021-04-01 DIAGNOSIS — H25813 Combined forms of age-related cataract, bilateral: Secondary | ICD-10-CM

## 2021-04-01 DIAGNOSIS — Z961 Presence of intraocular lens: Secondary | ICD-10-CM

## 2021-04-01 DIAGNOSIS — H43813 Vitreous degeneration, bilateral: Secondary | ICD-10-CM | POA: Diagnosis not present

## 2021-04-01 DIAGNOSIS — H3581 Retinal edema: Secondary | ICD-10-CM | POA: Diagnosis not present

## 2021-04-01 DIAGNOSIS — H353231 Exudative age-related macular degeneration, bilateral, with active choroidal neovascularization: Secondary | ICD-10-CM | POA: Diagnosis not present

## 2021-04-01 DIAGNOSIS — H40053 Ocular hypertension, bilateral: Secondary | ICD-10-CM

## 2021-04-01 MED ORDER — AFLIBERCEPT 2MG/0.05ML IZ SOLN FOR KALEIDOSCOPE
2.0000 mg | INTRAVITREAL | Status: AC | PRN
  Administered 2021-04-01: 2 mg via INTRAVITREAL

## 2021-04-12 ENCOUNTER — Other Ambulatory Visit: Payer: Self-pay | Admitting: Family Medicine

## 2021-04-19 ENCOUNTER — Other Ambulatory Visit: Payer: Self-pay | Admitting: Family Medicine

## 2021-04-20 ENCOUNTER — Inpatient Hospital Stay (HOSPITAL_COMMUNITY): Payer: Medicare HMO | Attending: Hematology

## 2021-04-20 ENCOUNTER — Inpatient Hospital Stay (HOSPITAL_COMMUNITY): Payer: Medicare HMO

## 2021-04-20 ENCOUNTER — Encounter (HOSPITAL_COMMUNITY): Payer: Self-pay

## 2021-04-20 VITALS — BP 141/61 | HR 66 | Temp 97.1°F | Resp 17

## 2021-04-20 DIAGNOSIS — Z79899 Other long term (current) drug therapy: Secondary | ICD-10-CM | POA: Diagnosis not present

## 2021-04-20 DIAGNOSIS — I69898 Other sequelae of other cerebrovascular disease: Secondary | ICD-10-CM | POA: Diagnosis not present

## 2021-04-20 DIAGNOSIS — C182 Malignant neoplasm of ascending colon: Secondary | ICD-10-CM | POA: Insufficient documentation

## 2021-04-20 DIAGNOSIS — M6281 Muscle weakness (generalized): Secondary | ICD-10-CM | POA: Diagnosis not present

## 2021-04-20 DIAGNOSIS — Z5112 Encounter for antineoplastic immunotherapy: Secondary | ICD-10-CM | POA: Insufficient documentation

## 2021-04-20 DIAGNOSIS — C189 Malignant neoplasm of colon, unspecified: Secondary | ICD-10-CM

## 2021-04-20 DIAGNOSIS — Z85038 Personal history of other malignant neoplasm of large intestine: Secondary | ICD-10-CM | POA: Diagnosis not present

## 2021-04-20 DIAGNOSIS — M48061 Spinal stenosis, lumbar region without neurogenic claudication: Secondary | ICD-10-CM | POA: Diagnosis not present

## 2021-04-20 LAB — CBC WITH DIFFERENTIAL/PLATELET
Abs Immature Granulocytes: 0.02 10*3/uL (ref 0.00–0.07)
Basophils Absolute: 0 10*3/uL (ref 0.0–0.1)
Basophils Relative: 0 %
Eosinophils Absolute: 0.2 10*3/uL (ref 0.0–0.5)
Eosinophils Relative: 4 %
HCT: 36.2 % (ref 36.0–46.0)
Hemoglobin: 12.1 g/dL (ref 12.0–15.0)
Immature Granulocytes: 0 %
Lymphocytes Relative: 25 %
Lymphs Abs: 1.2 10*3/uL (ref 0.7–4.0)
MCH: 31.3 pg (ref 26.0–34.0)
MCHC: 33.4 g/dL (ref 30.0–36.0)
MCV: 93.8 fL (ref 80.0–100.0)
Monocytes Absolute: 0.7 10*3/uL (ref 0.1–1.0)
Monocytes Relative: 15 %
Neutro Abs: 2.7 10*3/uL (ref 1.7–7.7)
Neutrophils Relative %: 56 %
Platelets: 130 10*3/uL — ABNORMAL LOW (ref 150–400)
RBC: 3.86 MIL/uL — ABNORMAL LOW (ref 3.87–5.11)
RDW: 13 % (ref 11.5–15.5)
WBC: 4.9 10*3/uL (ref 4.0–10.5)
nRBC: 0 % (ref 0.0–0.2)

## 2021-04-20 LAB — COMPREHENSIVE METABOLIC PANEL
ALT: 25 U/L (ref 0–44)
AST: 28 U/L (ref 15–41)
Albumin: 3.9 g/dL (ref 3.5–5.0)
Alkaline Phosphatase: 79 U/L (ref 38–126)
Anion gap: 9 (ref 5–15)
BUN: 24 mg/dL — ABNORMAL HIGH (ref 8–23)
CO2: 30 mmol/L (ref 22–32)
Calcium: 9.5 mg/dL (ref 8.9–10.3)
Chloride: 97 mmol/L — ABNORMAL LOW (ref 98–111)
Creatinine, Ser: 1.03 mg/dL — ABNORMAL HIGH (ref 0.44–1.00)
GFR, Estimated: 57 mL/min — ABNORMAL LOW (ref >60)
Glucose, Bld: 92 mg/dL (ref 70–99)
Potassium: 3.4 mmol/L — ABNORMAL LOW (ref 3.5–5.1)
Sodium: 136 mmol/L (ref 135–145)
Total Bilirubin: 0.7 mg/dL (ref 0.3–1.2)
Total Protein: 7.3 g/dL (ref 6.5–8.1)

## 2021-04-20 LAB — TSH: TSH: 2.534 u[IU]/mL (ref 0.350–4.500)

## 2021-04-20 MED ORDER — SODIUM CHLORIDE 0.9% FLUSH
10.0000 mL | INTRAVENOUS | Status: DC | PRN
  Administered 2021-04-20: 10 mL

## 2021-04-20 MED ORDER — SODIUM CHLORIDE 0.9 % IV SOLN
200.0000 mg | Freq: Once | INTRAVENOUS | Status: AC
  Administered 2021-04-20: 200 mg via INTRAVENOUS
  Filled 2021-04-20: qty 8

## 2021-04-20 MED ORDER — SODIUM CHLORIDE 0.9 % IV SOLN: Freq: Once | INTRAVENOUS | Status: AC

## 2021-04-20 MED ORDER — HEPARIN SOD (PORK) LOCK FLUSH 100 UNIT/ML IV SOLN
500.0000 [IU] | Freq: Once | INTRAVENOUS | Status: AC | PRN
  Administered 2021-04-20: 500 [IU]

## 2021-04-20 NOTE — Patient Instructions (Signed)
[facility]  Discharge Instructions: Thank you for choosing [facility] to provide your oncology and hematology care.  If you have a lab appointment with the Cancer Center, please come in thru the Main Entrance and check in at the main information desk.  Wear comfortable clothing and clothing appropriate for easy access to any Portacath or PICC line.   We strive to give you quality time with your provider. You may need to reschedule your appointment if you arrive late (15 or more minutes).  Arriving late affects you and other patients whose appointments are after yours.  Also, if you miss three or more appointments without notifying the office, you may be dismissed from the clinic at the provider's discretion.      For prescription refill requests, have your pharmacy contact our office and allow 72 hours for refills to be completed.    Today you received the following chemotherapy and/or immunotherapy agents Keytruda       To help prevent nausea and vomiting after your treatment, we encourage you to take your nausea medication as directed.  BELOW ARE SYMPTOMS THAT SHOULD BE REPORTED IMMEDIATELY: *FEVER GREATER THAN 100.4 F (38 C) OR HIGHER *CHILLS OR SWEATING *NAUSEA AND VOMITING THAT IS NOT CONTROLLED WITH YOUR NAUSEA MEDICATION *UNUSUAL SHORTNESS OF BREATH *UNUSUAL BRUISING OR BLEEDING *URINARY PROBLEMS (pain or burning when urinating, or frequent urination) *BOWEL PROBLEMS (unusual diarrhea, constipation, pain near the anus) TENDERNESS IN MOUTH AND THROAT WITH OR WITHOUT PRESENCE OF ULCERS (sore throat, sores in mouth, or a toothache) UNUSUAL RASH, SWELLING OR PAIN  UNUSUAL VAGINAL DISCHARGE OR ITCHING   Items with * indicate a potential emergency and should be followed up as soon as possible or go to the Emergency Department if any problems should occur.  Please show the CHEMOTHERAPY ALERT CARD or IMMUNOTHERAPY ALERT CARD at check-in to the Emergency  Department and triage nurse.  Should you have questions after your visit or need to cancel or reschedule your appointment, please contact [facility] [phone]  and follow the prompts.  Office hours are 8:00 a.m. to 4:30 p.m. Monday - Friday. Please note that voicemails left after 4:00 p.m. may not be returned until the following business day.  We are closed weekends and major holidays. You have access to a nurse at all times for urgent questions. Please call the main number to the clinic [phone] and follow the prompts.  For any non-urgent questions, you may also contact your provider using MyChart. We now offer e-Visits for anyone 18 and older to request care online for non-urgent symptoms. For details visit [email or web address].   Also download the MyChart app! Go to the app store, search "MyChart", open the app, select [facility], and log in with your MyChart username and password.  Due to Covid, a mask is required upon entering the hospital/clinic. If you do not have a mask, one will be given to you upon arrival. For doctor visits, patients may have 1 support person aged 18 or older with them. For treatment visits, patients cannot have anyone with them due to current Covid guidelines and our immunocompromised population.  

## 2021-04-20 NOTE — Progress Notes (Signed)
Patient presents today for treatment. Labs within parameters for treatment. Vital signs within parameters for treatment. Patient denies any changes since her last visit. Care giver at the bedside states, " her right eye is red and looks better today." Patient states she is using allergy drops that her eye doctor recommended and it has helped. Patient returns to eye doctor in June. Patient has no complaints of pain.    Treatment given today per MD orders. Tolerated infusion without adverse affects. Vital signs stable. No complaints at this time. Discharged from clinic ambulatory in stable condition. Alert and oriented x 3. F/U with [facility] as scheduled.

## 2021-04-20 NOTE — Progress Notes (Signed)
Patients port flushed without difficulty.  No blood return noted.  No bruising or swelling noted at site. Patient states that she could taste the saline as it was flushed.  Stable during port access and peripheral blood draw.  Patient to remain accessed for treatment.

## 2021-05-04 ENCOUNTER — Other Ambulatory Visit: Payer: Self-pay

## 2021-05-04 ENCOUNTER — Ambulatory Visit (INDEPENDENT_AMBULATORY_CARE_PROVIDER_SITE_OTHER): Payer: Medicare HMO | Admitting: Ophthalmology

## 2021-05-04 DIAGNOSIS — Z961 Presence of intraocular lens: Secondary | ICD-10-CM

## 2021-05-04 DIAGNOSIS — H353231 Exudative age-related macular degeneration, bilateral, with active choroidal neovascularization: Secondary | ICD-10-CM

## 2021-05-04 DIAGNOSIS — H43813 Vitreous degeneration, bilateral: Secondary | ICD-10-CM

## 2021-05-04 DIAGNOSIS — H25813 Combined forms of age-related cataract, bilateral: Secondary | ICD-10-CM

## 2021-05-04 DIAGNOSIS — H3581 Retinal edema: Secondary | ICD-10-CM

## 2021-05-04 DIAGNOSIS — H40053 Ocular hypertension, bilateral: Secondary | ICD-10-CM

## 2021-05-04 NOTE — Progress Notes (Signed)
[facility] Note  05/04/2021     CHIEF COMPLAINT Patient presents for Retina Follow Up   HISTORY OF PRESENT ILLNESS: Gabrielle [name] is a 74 y.o. female who presents to the clinic today for:   HPI    Retina Follow Up    Patient presents with  Wet AMD.  In both eyes.  This started weeks ago.  Severity is moderate.  Duration of weeks.  Since onset it is stable.  I, the attending physician,  performed the HPI with the patient and updated documentation appropriately.          Comments    Pt states vision is the same OU.  Pt denies eye pain or discomfort and denies any new or worsening floaters or fol OU.       Last edited by Bernarda Caffey, MD on 05/06/2021  9:38 PM. (History)    pt states her vision seems stable, she is on an allergy eye drops for redness and watering, but doesn't feel like it's helping   Referring physician: Janora Norlander, DO [place],  [place] [address]  HISTORICAL INFORMATION:   Selected notes from the MEDICAL RECORD NUMBER Referred by Dr. Cristela Blue for concern of ARMD OU   CURRENT MEDICATIONS: Current Outpatient Medications (Ophthalmic Drugs)  Medication Sig  . dorzolamide-timolol (COSOPT) 22.3-6.8 MG/ML ophthalmic solution PLACE 1 DROP INTO BOTH EYES 2 TIMES A DAY (Patient taking differently: Place 1 drop into both eyes 1 day or 1 dose.)   No current facility-administered medications for this visit. (Ophthalmic Drugs)   Current Outpatient Medications (Other)  Medication Sig  . alendronate (FOSAMAX) 70 MG tablet Take 1 tablet (70 mg total) by mouth every 7 (seven) days. Take with a full glass of water on an empty stomach.  Marland Kitchen amLODipine (NORVASC) 5 MG tablet Take 1 tablet (5 mg total) by mouth daily.  Marland Kitchen atorvastatin (LIPITOR) 20 MG tablet Take 1 tablet (20 mg total) by mouth daily. (NEEDS TO BE SEEN BEFORE NEXT REFILL)  . cholecalciferol (VITAMIN D3) 25 MCG (1000 UT) tablet Take 1,000 Units by mouth daily.  Marland Kitchen  CRANBERRY FRUIT PO Take 1 tablet by mouth daily.  . diclofenac Sodium (VOLTAREN) 1 % GEL APPLY 4 GRAMS TO AFFECTED AREA(S) 4 TIMES A DAY  . ELIQUIS 5 MG TABS tablet Take 5 mg by mouth 2 (two) times daily.  . FEROSUL 325 (65 Fe) MG tablet TAKE  (1)  TABLET TWICE A DAY WITH MEALS (BREAKFAST AND SUPPER)  . Garlic 128 MG TABS Take by mouth.  . LEUCOVORIN CALCIUM IV Inject into the vein every 14 (fourteen) days.   Marland Kitchen lidocaine-prilocaine (EMLA) cream Apply a small amount to port a cath site and cover with plastic wrap 1 hour prior to chemotherapy appointments  . losartan-hydrochlorothiazide (HYZAAR) 100-25 MG tablet Take 1 tablet by mouth daily.  . OXALIPLATIN IV Inject into the vein every 14 (fourteen) days.   . potassium chloride SA (KLOR-CON) 20 MEQ tablet Take 1 tablet (20 mEq total) by mouth daily.  . prochlorperazine (COMPAZINE) 10 MG tablet Take 1 tablet (10 mg total) by mouth every 6 (six) hours as needed for nausea or vomiting.   No current facility-administered medications for this visit. (Other)      REVIEW OF SYSTEMS: ROS    Positive for: Gastrointestinal, Genitourinary, Musculoskeletal, Cardiovascular, Eyes   Negative for: Constitutional, Neurological, Skin, HENT, Endocrine, Respiratory, Psychiatric, Allergic/Imm, Heme/Lymph   Last edited by  Leeann Must L on 05/04/2021  1:57 PM. (History)       ALLERGIES Allergies  Allergen Reactions  . Lisinopril Cough    PAST MEDICAL HISTORY Past Medical History:  Diagnosis Date  . Arthritis   . Cancer ([place])   . Cataract    OU  . Colon cancer ([place])   . Family history of pancreatic cancer 08/21/2020  . Family history of uterine cancer 08/21/2020  . H/O cesarean section   . Hx of tonsillectomy   . Hyperlipidemia   . Hypertension   . Macular degeneration    Exu ARMD OU  . Paroxysmal atrial fibrillation ([place]) 07/05/2020   Past Surgical History:  Procedure Laterality Date  . BIOPSY  04/06/2020   Procedure: BIOPSY;  Surgeon:  Rogene Houston, MD;  Location: [facility];  Service: Endoscopy;;  . CARPAL TUNNEL RELEASE Right   . CATARACT EXTRACTION W/PHACO Left 10/11/2019   Procedure: CATARACT EXTRACTION PHACO AND INTRAOCULAR LENS PLACEMENT (IOC);  Surgeon: Baruch Goldmann, MD;  Location: [facility] ORS;  Service: Ophthalmology;  Laterality: Left;  CDE: 8.56  . CATARACT EXTRACTION W/PHACO Right 10/25/2019   Procedure: CATARACT EXTRACTION PHACO AND INTRAOCULAR LENS PLACEMENT (IOC);  Surgeon: Baruch Goldmann, MD;  Location: [facility] ORS;  Service: Ophthalmology;  Laterality: Right;  CDE: 5.67  . CESAREAN SECTION    . COLONOSCOPY N/A 04/06/2020   Procedure: COLONOSCOPY;  Surgeon: Rogene Houston, MD;  Location: [facility];  Service: Endoscopy;  Laterality: N/A;  . CYSTOSCOPY WITH BIOPSY N/A 04/08/2020   Procedure: CYSTOSCOPY WITH BIOPSY;  Surgeon: Cleon Gustin, MD;  Location: [facility] ORS;  Service: Urology;  Laterality: N/A;  . ESOPHAGOGASTRODUODENOSCOPY N/A 04/05/2020   Procedure: ESOPHAGOGASTRODUODENOSCOPY (EGD);  Surgeon: Rogene Houston, MD;  [facility];  Service: Endoscopy;  Laterality: N/A;  . PARTIAL COLECTOMY N/A 04/08/2020   Procedure: PARTIAL COLECTOMY;  Surgeon: Virl Cagey, MD;  Location: [facility] ORS;  Service: General;  Laterality: N/A;  . PORTACATH PLACEMENT Left 05/18/2020   Procedure: INSERTION PORT-A-CATH (ATTACHED CATHETER IN LEFT SUBCLAVIAN);  Surgeon: Virl Cagey, MD;  Location: [facility] ORS;  Service: General;  Laterality: Left;  . TONSILLECTOMY      FAMILY HISTORY Family History  Problem Relation Age of Onset  . Macular degeneration Mother   . Stroke Mother   . Hypertension Mother   . Dementia Mother   . Lung cancer Father        dx late 83s; smoking hx  . Diabetes Sister   . Hypertension Sister   . Leukemia Paternal Uncle        d. 27s  . Cancer Maternal Aunt        ovarian or endometrial dx 91s  . Pancreatic cancer Paternal Uncle        d. late 97s    SOCIAL HISTORY Social History    Tobacco Use  . Smoking status: Never Smoker  . Smokeless tobacco: Never Used  Vaping Use  . Vaping Use: Never used  Substance Use Topics  . Alcohol use: No  . Drug use: No         OPHTHALMIC EXAM:  Base Eye Exam    Visual Acuity (Snellen - Linear)      Right Left   Dist cc CF @ 3' 20/100 -   Dist ph cc NI NI   Correction: Glasses       Tonometry (Tonopen, 2:02 PM)      Right Left   Pressure 15  25       Tonometry #2 (Tonopen, 2:02 PM)      Right Left   Pressure  24       Pupils      Dark Light Shape React APD   Right 4 3 Round Brisk 0   Left 4 3 Round Brisk 0       Visual Fields      Left Right    Full Full       Extraocular Movement      Right Left    Full Full       Neuro/Psych    Oriented x3: Yes   Mood/Affect: Normal       Dilation    Both eyes: 1.0% Mydriacyl, 2.5% Phenylephrine @ 2:02 PM        Slit Lamp and Fundus Exam    Slit Lamp Exam      Right Left   Lids/Lashes Dermatochalasis - upper lid Dermatochalasis - upper lid   Conjunctiva/Sclera 1+ Injection White and quiet   Cornea Arcus, 2-3+ fine Punctate epithelial erosions Arcus, 2+ fine Punctate epithelial erosions, mild tear film debris   Anterior Chamber Deep and quiet Deep and quiet   Iris Round and well dilated Round and well dilated   Lens PCIOL in good position, 1+ Posterior capsular opacification PC IOL in good position with open PC   Vitreous Vitreous syneresis, Posterior vitreous detachment, vitreous condensations Vitreous syneresis, Posterior vitreous detachment, silicone oil micro drops       Fundus Exam      Right Left   Disc pink and sharp, compact pink and sharp, Compact, vascular loops superiorly   C/D Ratio 0.2 0.2   Macula Blunted foveal reflex, Drusen, Pigment clumping and atrophy, central thickening/disciform scar, +PED, no heme, persistent, trace cystic changes overlying central scar - improved Blunted foveal reflex, central thickening, RPE clumping and  atrophy, Drusen, RPE rip, trace cystic changes overlying PED -- improved, no heme   Vessels Vascular attenuation, Tortuous Vascular attenuation, Tortuous   Periphery Attached, mild Reticular degeneration Attached, mild Reticular degeneration        Refraction    Wearing Rx      Sphere Cylinder Add   Right [place] Sphere +3.50   Left -0.50 Sphere +3.50   Type: PAL          IMAGING AND PROCEDURES  Imaging and Procedures for 03/21/18  OCT, Retina - OU - Both Eyes       Right Eye Quality was good. Central Foveal Thickness: 521. Progression has been stable. Findings include pigment epithelial detachment, epiretinal membrane, subretinal hyper-reflective material, retinal drusen , abnormal foveal contour, no SRF, intraretinal fluid, disciform scar, outer retinal tubulation, outer retinal atrophy (Stable sub-retinal scar, mild persistent cystic changes overlying).   Left Eye Quality was good. Central Foveal Thickness: 349. Progression has been stable. Findings include subretinal hyper-reflective material, pigment epithelial detachment, intraretinal fluid, retinal drusen , outer retinal atrophy, subretinal fluid, normal foveal contour (stable improvement in cystic changes overlying persistent PED / [facility]).   Notes *Images captured and stored on drive  Diagnosis / Impression:  Exudative ARMD OU OD: Stable sub-retinal scar, mild persistent cystic changes overlying OS: Mild interval improvement in cystic changes overlying persistent PED / [facility]  Clinical management:  See below  Abbreviations: NFP - Normal foveal profile. CME - cystoid macular edema. PED - pigment epithelial detachment. IRF - intraretinal fluid. SRF - subretinal fluid. EZ - ellipsoid zone. ERM - epiretinal membrane. ORA -  outer retinal atrophy. ORT - outer retinal tubulation. SRHM - subretinal hyper-reflective material        Intravitreal Injection, Pharmacologic Agent - OS - Left Eye       Time Out 05/04/2021. 3:01  PM. Confirmed correct patient, procedure, site, and patient consented.   Anesthesia Topical anesthesia was used. Anesthetic medications included Lidocaine 2%, Proparacaine 0.5%.   Procedure Preparation included 5% betadine to ocular surface, eyelid speculum. A (33g) needle was used.   Injection:  2 mg aflibercept Alfonse Flavors) SOLN   NDC: A3590391, Lot: [id number], Expiration date: 01/05/2022   Route: Intravitreal, Site: Left Eye, Waste: 0.05 mL  Post-op Post injection exam found visual acuity of at least counting fingers. The patient tolerated the procedure well. There were no complications. The patient received written and verbal post procedure care education. Post injection medications were not given.                 ASSESSMENT/PLAN:    ICD-10-CM   1. Exudative age-related macular degeneration of both eyes with active choroidal neovascularization (HCC)  H35.3231 Intravitreal Injection, Pharmacologic Agent - OS - Left Eye    aflibercept (EYLEA) SOLN 2 mg  2. Retinal edema  H35.81 OCT, Retina - OU - Both Eyes  3. Posterior vitreous detachment of both eyes  H43.813   4. Pseudophakia of both eyes  Z96.1   5. Ocular hypertension, bilateral  H40.053     1,2. Exudative age related macular degeneration, both eyes.    - severe exudative disease with very active CNVM OU at presentation in January 2019  - S/P IVA OD #1 (01.04.19), #2 (02.15.19)  - S/P IVA OS #1 (01.18.19), #2 (02.15.19)  - switched to Eylea 3.18.19 due to severity of disease  - S/P IVE OD #1 (03.18.19), #2 (04.17.19), #3 (05.15.19), #4 (10.02.19),  #5 (01.15.20), #6 (06.04.20), #7 (10.29.20), #8 (01.21.21), #9 (05.27.21), #10 (07.22.21) -- injections held due to stable disciform scar  - S/P IVE OS #1 (03.18.19), #2 (04.17.19), #3 (05.15.19), #4 (06.12.19), #5 (07.10.19), #6 (08.07.19), #7 (09.04.19), #8 (10.02.19), #9 (11.06.19), #10 (12.11.19), #11 (01.15.20), #12 (02.20.20), #13 (03.26.20), #14 (04.30.20), #15  (06.04.20), #16 (07.16.20), # 17 (08.20.20), #18 (09.24.20), #19 (10.29.20), #20 (12.03.20), #21 (01.21.21), #22 (03.18.21), #23 (05.27.21), #24 (07.22.21), #25 (11.11.21), #26 (12.09.21), #27 (01.06.22), #28 (2.7.22), #29 (03.24.22), #30 (4.28.22)  - IVE OS held from 7.22.21 to 11.11.21 due to TIA/stroke on 8.1.21  - OCT OS today stable improvement in IRF/cystic changes overlying PED / SRHM at 4.5 wks  - exam OS shows macular heme improved  - OD with significant subretinal disciform scar - tr persistent cystic changes overlying IRHM  - BCVA OD stable at CF 3'; OS stable at 20/100   - recommend IVE OS #30 today, 04.28.22 -- will cont to hold OD  - Eylea informed consent form signed and scanned on 01.21.2021  - Eylea4U paperwork filled out on 02.15.19 and fully approved through [employer] -- approved  - f/u in 5-6 weeks, sooner prn for DFE/OCT/possible injection(s)  3. PVD / vitreous syneresis  - Discussed findings and prognosis   - No RT or RD on 360  exam  - Reviewed s/s of RT/RD  - Strict return precautions for any such RT/RD signs/symptoms  4. Pseudophakia OU  - s/p CE/IOL (Dr. Marisa Hua, 11.2020)  - beautiful surgeries w/ IOLs in excellent position, doing well  - monitor  5. Ocular Hypertension OU  - IOP 15,25  - cont Cosopt qd OU --  increase to BID   Ophthalmic Meds Ordered this visit:  Meds ordered this encounter  Medications  . aflibercept (EYLEA) SOLN 2 mg       Return for f/u 5-6 weeks, exu ARMD OS, DFE, OCT.  There are no Patient Instructions on file for this visit.  This document serves as a record of services personally performed by Gardiner Sleeper, MD, PhD. It was created on their behalf by Leeann Must, [place], an ophthalmic technician. The creation of this record is the provider's dictation and/or activities during the visit.    Electronically signed by: Leeann Must, COA @TODAY @ 9:50 PM  This document serves as a record of services personally performed by  Gardiner Sleeper, MD, PhD. It was created on their behalf by San Jetty. Owens Shark, OA an ophthalmic technician. The creation of this record is the provider's dictation and/or activities during the visit.    Electronically signed by: San Jetty. Marguerita Merles 04.28.2022 9:50 PM  Gardiner Sleeper, M.D., Ph.D. Diseases & Surgery of the Retina and [place] 04/01/2021   I have reviewed the above documentation for accuracy and completeness, and I agree with the above. Gardiner Sleeper, M.D., Ph.D. 04/01/21 9:50 PM  Abbreviations: M myopia (nearsighted); A astigmatism; H hyperopia (farsighted); P presbyopia; Mrx spectacle prescription;  CTL contact lenses; OD right eye; OS left eye; OU both eyes  XT exotropia; ET esotropia; PEK punctate epithelial keratitis; PEE punctate epithelial erosions; DES dry eye syndrome; MGD meibomian gland dysfunction; ATs artificial tears; PFAT's preservative free artificial tears; [place] nuclear sclerotic cataract; PSC posterior subcapsular cataract; ERM epi-retinal membrane; PVD posterior vitreous detachment; RD retinal detachment; DM diabetes mellitus; DR diabetic retinopathy; NPDR non-proliferative diabetic retinopathy; PDR proliferative diabetic retinopathy; CSME clinically significant macular edema; DME diabetic macular edema; dbh dot blot hemorrhages; CWS cotton wool spot; POAG primary open angle glaucoma; C/D cup-to-disc ratio; HVF humphrey visual field; GVF goldmann visual field; OCT optical coherence tomography; IOP intraocular pressure; BRVO Branch retinal vein occlusion; CRVO central retinal vein occlusion; CRAO central retinal artery occlusion; BRAO branch retinal artery occlusion; RT retinal tear; SB scleral buckle; PPV pars plana vitrectomy; VH Vitreous hemorrhage; PRP panretinal laser photocoagulation; IVK intravitreal kenalog; VMT vitreomacular traction; MH Macular hole;  NVD neovascularization of the disc; NVE neovascularization elsewhere; AREDS  age related eye disease study; ARMD age related macular degeneration; POAG primary open angle glaucoma; EBMD epithelial/anterior basement membrane dystrophy; ACIOL anterior chamber intraocular lens; IOL intraocular lens; PCIOL posterior chamber intraocular lens; Phaco/IOL phacoemulsification with intraocular lens placement; [place] photorefractive keratectomy; LASIK laser assisted in situ keratomileusis; HTN hypertension; DM diabetes mellitus; COPD chronic obstructive pulmonary disease

## 2021-05-05 ENCOUNTER — Ambulatory Visit (HOSPITAL_COMMUNITY)
Admission: RE | Admit: 2021-05-05 | Discharge: 2021-05-05 | Disposition: A | Discharge status: Home / Self Care | Payer: Medicare HMO | Source: Ambulatory Visit | Attending: Hematology | Admitting: Hematology

## 2021-05-05 DIAGNOSIS — I2510 Atherosclerotic heart disease of native coronary artery without angina pectoris: Secondary | ICD-10-CM | POA: Diagnosis not present

## 2021-05-05 DIAGNOSIS — C189 Malignant neoplasm of colon, unspecified: Secondary | ICD-10-CM | POA: Diagnosis not present

## 2021-05-05 DIAGNOSIS — C182 Malignant neoplasm of ascending colon: Secondary | ICD-10-CM | POA: Diagnosis not present

## 2021-05-05 DIAGNOSIS — N3289 Other specified disorders of bladder: Secondary | ICD-10-CM | POA: Diagnosis not present

## 2021-05-05 DIAGNOSIS — I358 Other nonrheumatic aortic valve disorders: Secondary | ICD-10-CM | POA: Diagnosis not present

## 2021-05-05 DIAGNOSIS — N281 Cyst of kidney, acquired: Secondary | ICD-10-CM | POA: Diagnosis not present

## 2021-05-05 DIAGNOSIS — R918 Other nonspecific abnormal finding of lung field: Secondary | ICD-10-CM | POA: Diagnosis not present

## 2021-05-05 MED ORDER — IOHEXOL 300 MG/ML  SOLN
100.0000 mL | Freq: Once | INTRAMUSCULAR | Status: AC | PRN
  Administered 2021-05-05: 100 mL via INTRAVENOUS

## 2021-05-06 ENCOUNTER — Encounter (INDEPENDENT_AMBULATORY_CARE_PROVIDER_SITE_OTHER): Payer: Medicare HMO | Admitting: Ophthalmology

## 2021-05-06 ENCOUNTER — Encounter (INDEPENDENT_AMBULATORY_CARE_PROVIDER_SITE_OTHER): Payer: Self-pay | Admitting: Ophthalmology

## 2021-05-06 DIAGNOSIS — Z961 Presence of intraocular lens: Secondary | ICD-10-CM | POA: Diagnosis not present

## 2021-05-06 DIAGNOSIS — H353231 Exudative age-related macular degeneration, bilateral, with active choroidal neovascularization: Secondary | ICD-10-CM

## 2021-05-06 DIAGNOSIS — H3581 Retinal edema: Secondary | ICD-10-CM | POA: Diagnosis not present

## 2021-05-06 DIAGNOSIS — H43813 Vitreous degeneration, bilateral: Secondary | ICD-10-CM | POA: Diagnosis not present

## 2021-05-06 DIAGNOSIS — H40053 Ocular hypertension, bilateral: Secondary | ICD-10-CM | POA: Diagnosis not present

## 2021-05-06 MED ORDER — AFLIBERCEPT 2MG/0.05ML IZ SOLN FOR KALEIDOSCOPE
2.0000 mg | INTRAVITREAL | Status: AC | PRN
  Administered 2021-05-06: 2 mg via INTRAVITREAL

## 2021-05-11 ENCOUNTER — Inpatient Hospital Stay (HOSPITAL_COMMUNITY): Payer: Medicare HMO

## 2021-05-11 ENCOUNTER — Inpatient Hospital Stay (HOSPITAL_COMMUNITY): Payer: Medicare HMO | Attending: Hematology

## 2021-05-11 ENCOUNTER — Encounter (HOSPITAL_COMMUNITY): Payer: Self-pay | Admitting: Hematology

## 2021-05-11 ENCOUNTER — Inpatient Hospital Stay (HOSPITAL_BASED_OUTPATIENT_CLINIC_OR_DEPARTMENT_OTHER): Payer: Medicare HMO | Admitting: Hematology and Oncology

## 2021-05-11 ENCOUNTER — Other Ambulatory Visit: Payer: Self-pay

## 2021-05-11 VITALS — BP 148/63 | HR 81 | Temp 97.0°F | Resp 18 | Wt 168.2 lb

## 2021-05-11 VITALS — BP 155/61 | HR 64 | Temp 96.9°F | Resp 17

## 2021-05-11 DIAGNOSIS — C189 Malignant neoplasm of colon, unspecified: Secondary | ICD-10-CM

## 2021-05-11 DIAGNOSIS — Z5112 Encounter for antineoplastic immunotherapy: Secondary | ICD-10-CM | POA: Diagnosis not present

## 2021-05-11 DIAGNOSIS — I10 Essential (primary) hypertension: Secondary | ICD-10-CM | POA: Insufficient documentation

## 2021-05-11 DIAGNOSIS — Z79899 Other long term (current) drug therapy: Secondary | ICD-10-CM | POA: Insufficient documentation

## 2021-05-11 DIAGNOSIS — I480 Paroxysmal atrial fibrillation: Secondary | ICD-10-CM | POA: Diagnosis not present

## 2021-05-11 DIAGNOSIS — C182 Malignant neoplasm of ascending colon: Secondary | ICD-10-CM

## 2021-05-11 LAB — CBC WITH DIFFERENTIAL/PLATELET
Abs Immature Granulocytes: 0.02 10*3/uL (ref 0.00–0.07)
Basophils Absolute: 0 10*3/uL (ref 0.0–0.1)
Basophils Relative: 1 %
Eosinophils Absolute: 0.2 10*3/uL (ref 0.0–0.5)
Eosinophils Relative: 4 %
HCT: 36.4 % (ref 36.0–46.0)
Hemoglobin: 12.2 g/dL (ref 12.0–15.0)
Immature Granulocytes: 0 %
Lymphocytes Relative: 29 %
Lymphs Abs: 1.6 10*3/uL (ref 0.7–4.0)
MCH: 31.9 pg (ref 26.0–34.0)
MCHC: 33.5 g/dL (ref 30.0–36.0)
MCV: 95 fL (ref 80.0–100.0)
Monocytes Absolute: 0.6 10*3/uL (ref 0.1–1.0)
Monocytes Relative: 11 %
Neutro Abs: 3 10*3/uL (ref 1.7–7.7)
Neutrophils Relative %: 55 %
Platelets: 140 10*3/uL — ABNORMAL LOW (ref 150–400)
RBC: 3.83 MIL/uL — ABNORMAL LOW (ref 3.87–5.11)
RDW: 13.2 % (ref 11.5–15.5)
WBC: 5.4 10*3/uL (ref 4.0–10.5)
nRBC: 0 % (ref 0.0–0.2)

## 2021-05-11 LAB — COMPREHENSIVE METABOLIC PANEL
ALT: 17 U/L (ref 0–44)
AST: 22 U/L (ref 15–41)
Albumin: 4.1 g/dL (ref 3.5–5.0)
Alkaline Phosphatase: 64 U/L (ref 38–126)
Anion gap: 8 (ref 5–15)
BUN: 23 mg/dL (ref 8–23)
CO2: 30 mmol/L (ref 22–32)
Calcium: 9.6 mg/dL (ref 8.9–10.3)
Chloride: 99 mmol/L (ref 98–111)
Creatinine, Ser: 0.96 mg/dL (ref 0.44–1.00)
GFR, Estimated: >60 mL/min (ref >60)
Glucose, Bld: 94 mg/dL (ref 70–99)
Potassium: 3.1 mmol/L — ABNORMAL LOW (ref 3.5–5.1)
Sodium: 137 mmol/L (ref 135–145)
Total Bilirubin: 0.6 mg/dL (ref 0.3–1.2)
Total Protein: 7.5 g/dL (ref 6.5–8.1)

## 2021-05-11 MED ORDER — POTASSIUM CHLORIDE CRYS ER 20 MEQ PO TBCR
40.0000 meq | EXTENDED_RELEASE_TABLET | Freq: Once | ORAL | Status: AC
  Administered 2021-05-11: 40 meq via ORAL
  Filled 2021-05-11: qty 2

## 2021-05-11 MED ORDER — SODIUM CHLORIDE 0.9% FLUSH
10.0000 mL | INTRAVENOUS | Status: DC | PRN
  Administered 2021-05-11: 10 mL

## 2021-05-11 MED ORDER — SODIUM CHLORIDE 0.9 % IV SOLN
Freq: Once | INTRAVENOUS | Status: AC
Start: 2021-05-11 — End: 2021-05-11

## 2021-05-11 MED ORDER — HEPARIN SOD (PORK) LOCK FLUSH 100 UNIT/ML IV SOLN
500.0000 [IU] | Freq: Once | INTRAVENOUS | Status: AC | PRN
  Administered 2021-05-11: 500 [IU]

## 2021-05-11 MED ORDER — SODIUM CHLORIDE 0.9 % IV SOLN
200.0000 mg | Freq: Once | INTRAVENOUS | Status: AC
  Administered 2021-05-11: 200 mg via INTRAVENOUS
  Filled 2021-05-11: qty 8

## 2021-05-11 NOTE — Patient Instructions (Signed)
[facility]  Discharge Instructions: Thank you for choosing [facility] to provide your oncology and hematology care.  If you have a lab appointment with the Cancer Center, please come in thru the Main Entrance and check in at the main information desk.  Wear comfortable clothing and clothing appropriate for easy access to any Portacath or PICC line.   We strive to give you quality time with your provider. You may need to reschedule your appointment if you arrive late (15 or more minutes).  Arriving late affects you and other patients whose appointments are after yours.  Also, if you miss three or more appointments without notifying the office, you may be dismissed from the clinic at the provider's discretion.      For prescription refill requests, have your pharmacy contact our office and allow 72 hours for refills to be completed.    Today you received the following chemotherapy and/or immunotherapy agents Keytruda       To help prevent nausea and vomiting after your treatment, we encourage you to take your nausea medication as directed.  BELOW ARE SYMPTOMS THAT SHOULD BE REPORTED IMMEDIATELY: *FEVER GREATER THAN 100.4 F (38 C) OR HIGHER *CHILLS OR SWEATING *NAUSEA AND VOMITING THAT IS NOT CONTROLLED WITH YOUR NAUSEA MEDICATION *UNUSUAL SHORTNESS OF BREATH *UNUSUAL BRUISING OR BLEEDING *URINARY PROBLEMS (pain or burning when urinating, or frequent urination) *BOWEL PROBLEMS (unusual diarrhea, constipation, pain near the anus) TENDERNESS IN MOUTH AND THROAT WITH OR WITHOUT PRESENCE OF ULCERS (sore throat, sores in mouth, or a toothache) UNUSUAL RASH, SWELLING OR PAIN  UNUSUAL VAGINAL DISCHARGE OR ITCHING   Items with * indicate a potential emergency and should be followed up as soon as possible or go to the Emergency Department if any problems should occur.  Please show the CHEMOTHERAPY ALERT CARD or IMMUNOTHERAPY ALERT CARD at check-in to the Emergency  Department and triage nurse.  Should you have questions after your visit or need to cancel or reschedule your appointment, please contact [facility] [phone]  and follow the prompts.  Office hours are 8:00 a.m. to 4:30 p.m. Monday - Friday. Please note that voicemails left after 4:00 p.m. may not be returned until the following business day.  We are closed weekends and major holidays. You have access to a nurse at all times for urgent questions. Please call the main number to the clinic [phone] and follow the prompts.  For any non-urgent questions, you may also contact your provider using MyChart. We now offer e-Visits for anyone 18 and older to request care online for non-urgent symptoms. For details visit [email or web address].   Also download the MyChart app! Go to the app store, search "MyChart", open the app, select [facility], and log in with your MyChart username and password.  Due to Covid, a mask is required upon entering the hospital/clinic. If you do not have a mask, one will be given to you upon arrival. For doctor visits, patients may have 1 support person aged 18 or older with them. For treatment visits, patients cannot have anyone with them due to current Covid guidelines and our immunocompromised population.  

## 2021-05-11 NOTE — Progress Notes (Signed)
Venipuncture performed with a 23 gauge butterfly needle to R wrist.  Gabrielle [name] tolerated procedure well and without incident;

## 2021-05-11 NOTE — Progress Notes (Signed)
Gabrielle [name], Gabrielle [name] [id number]   CLINIC:  Medical Oncology/Hematology  PCP:  Janora Norlander, DO [address] [place] [address] [phone]   REASON FOR VISIT:  Follow-up for right colon cancer  PRIOR THERAPY:  1. Right hemicolectomy on 04/08/2020. 2. FOLFOX x 3 cycles from 05/19/2020 to 07/14/2020.  NGS Results: Foundation 1 KRAS/NRAS wild-type, MSI--high, TMB 55 Muts/Mb, BRAF V 600 E  CURRENT THERAPY: Keytruda every 3 weeks  BRIEF ONCOLOGIC HISTORY:  Oncology History  Malignant neoplasm of colon ([place])  04/07/2020 Initial Diagnosis   Malignant neoplasm of ascending colon ([place])   05/13/2020 Genetic Testing   Foundation One     05/19/2020 - 07/02/2020 Chemotherapy   The patient had palonosetron (ALOXI) injection 0.25 mg, 0.25 mg, Intravenous,  Once, 2 of 12 cycles Administration: 0.25 mg (05/19/2020), 0.25 mg (06/30/2020) leucovorin 522 mg in dextrose 5 % 250 mL infusion, 320 mg/m2 = 522 mg (80 % of original dose 400 mg/m2), Intravenous,  Once, 2 of 12 cycles Dose modification: 320 mg/m2 (80 % of original dose 400 mg/m2, Cycle 1, Reason: Provider Judgment), 309.4076 mg/m2 (66.7 % of original dose 400 mg/m2, Cycle 2, Reason: Provider Judgment) Administration: 522 mg (05/19/2020), 434 mg (06/30/2020) oxaliplatin (ELOXATIN) 110 mg in dextrose 5 % 500 mL chemo infusion, 68 mg/m2 = 110 mg (80 % of original dose 85 mg/m2), Intravenous,  Once, 1 of 1 cycle Dose modification: 68 mg/m2 (80 % of original dose 85 mg/m2, Cycle 1, Reason: Provider Judgment) Administration: 110 mg (05/19/2020) fluorouracil (ADRUCIL) chemo injection 500 mg, 320 mg/m2 = 500 mg (80 % of original dose 400 mg/m2), Intravenous,  Once, 2 of 12 cycles Dose modification: 320 mg/m2 (80 % of original dose 400 mg/m2, Cycle 1, Reason: Provider Judgment), 808.8110 mg/m2 (66.7 % of original dose 400 mg/m2, Cycle 2, Reason: Provider Judgment) Administration: 500 mg (05/19/2020), 450  mg (06/30/2020) fluorouracil (ADRUCIL) 3,150 mg in sodium chloride 0.9 % 87 mL chemo infusion, 1,920 mg/m2 = 3,150 mg (80 % of original dose 2,400 mg/m2), Intravenous, 1 Day/Dose, 2 of 12 cycles Dose modification: 1,920 mg/m2 (80 % of original dose 2,400 mg/m2, Cycle 1, Reason: Provider Judgment), 1,600 mg/m2 (66.7 % of original dose 2,400 mg/m2, Cycle 2, Reason: Provider Judgment) Administration: 3,150 mg (05/19/2020), 2,600 mg (06/30/2020)  for chemotherapy treatment.    08/03/2020 Genetic Testing   Guardant Reveal Testing     08/14/2020 Genetic Testing   No pathogenic variants detected in [facility] Common Hereditary Cancers Panel.  Variant of uncertain significance (VUS) detected in HOXB13 at c.634G>A (p.Ala212Thr). The Common Hereditary Cancers Panel offered by [facility] includes sequencing and/or deletion duplication testing of the following 48 genes: APC, ATM, AXIN2, BARD1, BMPR1A, BRCA1, BRCA2, BRIP1, CDH1, CDK4, CDKN2A (p14ARF), CDKN2A (p16INK4a), CHEK2, CTNNA1, DICER1, EPCAM (Deletion/duplication testing only), FLCN, GREM1 (promoter region deletion/duplication testing only), KIT, MEN1, MLH1, MSH2, MSH3, MSH6, MUTYH, NBN, NF1, NHTL1, PALB2, PDGFRA, PMS2, POLD1, POLE, PTEN, RAD50, RAD51C, RAD51D, RNF43, SDHB, SDHC, SDHD, SMAD4, SMARCA4. STK11, TP53, TSC1, TSC2, and VHL.  The following genes were evaluated for sequence changes only: SDHA and HOXB13 c.251G>A variant only. The report date is August 14, 2020.    11/05/2020 Cancer Staging   Staging form: Colon and Rectum, AJCC 8th Edition - Pathologic stage from 11/05/2020: Stage IVC (rpTX, pN0, pM1c) - Signed by Derek Jack, MD on 11/05/2020   11/10/2020 -  Chemotherapy    Patient is on Treatment Plan: COLORECTAL PEMBROLIZUMAB Q21D  CANCER STAGING: Cancer Staging Malignant neoplasm of colon [facility]) Staging form: Colon and Rectum, AJCC 8th Edition - Clinical stage from 04/27/2020: Stage IIIB (cT4a, cN1a, cM0) - Unsigned - Pathologic  stage from 11/05/2020: Stage IVC (rpTX, pN0, pM1c) - Signed by Derek Jack, MD on 11/05/2020   INTERVAL HISTORY:  Gabrielle [name], a 75 y.o. female, returns for routine follow-up and consideration for next cycle of immunotherapy. Gabrielle [name] was last seen on 03/30/2021.  Due for cycle #8 of Keytruda today.   On exam today Gabrielle [name] reports that she feels excellent.  She is not having any fatigue and her friend reports that she is "like energizer bunny".  She does that she is not having any nausea, vomiting, or diarrhea.  Her appetite is good and her weight has been increasing rapidly, she is up 8 pounds from her last visit.  She reports that she is also been taking in plenty of water and is having no emotional issues.  She otherwise denies any fevers, chills, sweats.  A full 10 point ROS is listed below  Overall, she feels ready for next cycle of immunotherapy today.    REVIEW OF SYSTEMS:  Review of Systems  Constitutional: Negative for appetite change and fatigue.  Gastrointestinal: Negative for diarrhea, nausea and vomiting.  Skin: Negative for rash.  All other systems reviewed and are negative.   PAST MEDICAL/SURGICAL HISTORY:  Past Medical History:  Diagnosis Date  . Arthritis   . Cancer ([place])   . Cataract    OU  . Colon cancer ([place])   . Family history of pancreatic cancer 08/21/2020  . Family history of uterine cancer 08/21/2020  . H/O cesarean section   . Hx of tonsillectomy   . Hyperlipidemia   . Hypertension   . Macular degeneration    Exu ARMD OU  . Paroxysmal atrial fibrillation ([place]) 07/05/2020   Past Surgical History:  Procedure Laterality Date  . BIOPSY  04/06/2020   Procedure: BIOPSY;  Surgeon: Rogene Houston, MD;  Location: [facility];  Service: Endoscopy;;  . CARPAL TUNNEL RELEASE Right   . CATARACT EXTRACTION W/PHACO Left 10/11/2019   Procedure: CATARACT EXTRACTION PHACO AND INTRAOCULAR LENS PLACEMENT (IOC);  Surgeon: Baruch Goldmann, MD;   Location: [facility] ORS;  Service: Ophthalmology;  Laterality: Left;  CDE: 8.56  . CATARACT EXTRACTION W/PHACO Right 10/25/2019   Procedure: CATARACT EXTRACTION PHACO AND INTRAOCULAR LENS PLACEMENT (IOC);  Surgeon: Baruch Goldmann, MD;  Location: [facility] ORS;  Service: Ophthalmology;  Laterality: Right;  CDE: 5.67  . CESAREAN SECTION    . COLONOSCOPY N/A 04/06/2020   Procedure: COLONOSCOPY;  Surgeon: Rogene Houston, MD;  Location: [facility];  Service: Endoscopy;  Laterality: N/A;  . CYSTOSCOPY WITH BIOPSY N/A 04/08/2020   Procedure: CYSTOSCOPY WITH BIOPSY;  Surgeon: Cleon Gustin, MD;  Location: [facility] ORS;  Service: Urology;  Laterality: N/A;  . ESOPHAGOGASTRODUODENOSCOPY N/A 04/05/2020   Procedure: ESOPHAGOGASTRODUODENOSCOPY (EGD);  Surgeon: Rogene Houston, MD;  Location: [facility];  Service: Endoscopy;  Laterality: N/A;  . PARTIAL COLECTOMY N/A 04/08/2020   Procedure: PARTIAL COLECTOMY;  Surgeon: Virl Cagey, MD;  Location: [facility] ORS;  Service: General;  Laterality: N/A;  . PORTACATH PLACEMENT Left 05/18/2020   Procedure: INSERTION PORT-A-CATH (ATTACHED CATHETER IN LEFT SUBCLAVIAN);  Surgeon: Virl Cagey, MD;  Location: [facility] ORS;  Service: General;  Laterality: Left;  . TONSILLECTOMY      SOCIAL HISTORY:  Social History   Socioeconomic History  .  Marital status: Divorced    Spouse name: Not on file  . Number of children: Not on file  . Years of education: Not on file  . Highest education level: Not on file  Occupational History  . Not on file  Tobacco Use  . Smoking status: Never Smoker  . Smokeless tobacco: Never Used  Vaping Use  . Vaping Use: Never used  Substance and Sexual Activity  . Alcohol use: No  . Drug use: No  . Sexual activity: Not Currently  Other Topics Concern  . Not on file  Social History Narrative  . Not on file   Social Determinants of Health   Financial Resource Strain: Medium Risk  . Difficulty of Paying Living Expenses: Somewhat hard  Food  Insecurity: Food Insecurity Present  . Worried About Charity fundraiser in the Last Year: Sometimes true  . Ran Out of Food in the Last Year: Never true  Transportation Needs: Unmet Transportation Needs  . Lack of Transportation (Medical): Yes  . Lack of Transportation (Non-Medical): Yes  Physical Activity: Insufficiently Active  . Days of Exercise per Week: 7 days  . Minutes of Exercise per Session: 20 min  Stress: Stress Concern Present  . Feeling of Stress : Very much  Social Connections: Moderately Integrated  . Frequency of Communication with Friends and Family: More than three times a week  . Frequency of Social Gatherings with Friends and Family: Once a week  . Attends Religious Services: More than 4 times per year  . Active Member of Clubs or Organizations: Yes  . Attends Archivist Meetings: Never  . Marital Status: Divorced  Human resources officer Violence: Not At Risk  . Fear of Current or Ex-Partner: No  . Emotionally Abused: No  . Physically Abused: No  . Sexually Abused: No    FAMILY HISTORY:  Family History  Problem Relation Age of Onset  . Macular degeneration Mother   . Stroke Mother   . Hypertension Mother   . Dementia Mother   . Lung cancer Father        dx late 13s; smoking hx  . Diabetes Sister   . Hypertension Sister   . Leukemia Paternal Uncle        d. 35s  . Cancer Maternal Aunt        ovarian or endometrial dx 79s  . Pancreatic cancer Paternal Uncle        d. late 62s    CURRENT MEDICATIONS:  Current Outpatient Medications  Medication Sig Dispense Refill  . alendronate (FOSAMAX) 70 MG tablet Take 1 tablet (70 mg total) by mouth every 7 (seven) days. Take with a full glass of water on an empty stomach. 4 tablet 11  . amLODipine (NORVASC) 5 MG tablet Take 1 tablet (5 mg total) by mouth daily. 90 tablet 3  . atorvastatin (LIPITOR) 20 MG tablet Take 1 tablet (20 mg total) by mouth daily. (NEEDS TO BE SEEN BEFORE NEXT REFILL) 30 tablet 0  .  cholecalciferol (VITAMIN D3) 25 MCG (1000 UT) tablet Take 1,000 Units by mouth daily.    Marland Kitchen CRANBERRY FRUIT PO Take 1 tablet by mouth daily.    . diclofenac Sodium (VOLTAREN) 1 % GEL APPLY 4 GRAMS TO AFFECTED AREA(S) 4 TIMES A DAY 400 g 2  . dorzolamide-timolol (COSOPT) 22.3-6.8 MG/ML ophthalmic solution PLACE 1 DROP INTO BOTH EYES 2 TIMES A DAY (Patient taking differently: Place 1 drop into both eyes 1 day or 1 dose.) 10  mL 0  . ELIQUIS 5 MG TABS tablet Take 5 mg by mouth 2 (two) times daily.    . FEROSUL 325 (65 Fe) MG tablet TAKE  (1)  TABLET TWICE A DAY WITH MEALS (BREAKFAST AND SUPPER) 180 tablet 0  . Garlic 712 MG TABS Take by mouth.    . LEUCOVORIN CALCIUM IV Inject into the vein every 14 (fourteen) days.     Marland Kitchen lidocaine-prilocaine (EMLA) cream Apply a small amount to port a cath site and cover with plastic wrap 1 hour prior to chemotherapy appointments 30 g 0  . losartan-hydrochlorothiazide (HYZAAR) 100-25 MG tablet Take 1 tablet by mouth daily. 90 tablet 3  . OXALIPLATIN IV Inject into the vein every 14 (fourteen) days.     . potassium chloride SA (KLOR-CON) 20 MEQ tablet Take 1 tablet (20 mEq total) by mouth daily. 90 tablet 1  . prochlorperazine (COMPAZINE) 10 MG tablet Take 1 tablet (10 mg total) by mouth every 6 (six) hours as needed for nausea or vomiting. 30 tablet 0   No current facility-administered medications for this visit.    ALLERGIES:  Allergies  Allergen Reactions  . Lisinopril Cough    PHYSICAL EXAM:  Performance status (ECOG): 1 - Symptomatic but completely ambulatory  Vitals:   05/11/21 1221 05/11/21 1243  BP:  (!) 148/63  Pulse: 81   Resp: 18   Temp: (!) 97 F (36.1 C)   SpO2: 98%    Wt Readings from Last 3 Encounters:  05/11/21 168 lb 3.2 oz (76.3 kg)  04/20/21 163 lb 8 oz (74.2 kg)  03/30/21 160 lb 11.5 oz (72.9 kg)   Physical Exam Vitals reviewed.  Constitutional:      Appearance: Normal appearance.  Eyes:     Conjunctiva/sclera:     Right  eye: Right conjunctiva is injected.     Left eye: Left conjunctiva is injected.  Cardiovascular:     Rate and Rhythm: Normal rate and regular rhythm.     Pulses: Normal pulses.     Heart sounds: Normal heart sounds.  Pulmonary:     Effort: Pulmonary effort is normal.     Breath sounds: Normal breath sounds.  Chest:     Comments: Port-a-Cath in L chest Abdominal:     Palpations: Abdomen is soft. There is no mass.     Tenderness: There is no abdominal tenderness.  Musculoskeletal:     Right lower leg: No edema.     Left lower leg: No edema.  Skin:    Findings: No rash.  Neurological:     General: No focal deficit present.     Mental Status: She is alert and oriented to person, place, and time.  Psychiatric:        Mood and Affect: Mood normal.        Behavior: Behavior normal.     LABORATORY DATA:  I have reviewed the labs as listed.  CBC Latest Ref Rng & Units 05/11/2021 04/20/2021 03/30/2021  WBC 4.0 - 10.5 K/uL 5.4 4.9 6.3  Hemoglobin 12.0 - 15.0 g/dL 12.2 12.1 12.3  Hematocrit 36.0 - 46.0 % 36.4 36.2 37.8  Platelets 150 - 400 K/uL 140(L) 130(L) 136(L)   CMP Latest Ref Rng & Units 05/11/2021 04/20/2021 03/30/2021  Glucose 70 - 99 mg/dL 94 92 92  BUN 8 - 23 mg/dL 23 24(H) 29(H)  Creatinine 0.44 - 1.00 mg/dL 0.96 1.03(H) 0.95  Sodium 135 - 145 mmol/L 137 136 137  Potassium 3.5 - 5.1  mmol/L 3.1(L) 3.4(L) 3.3(L)  Chloride 98 - 111 mmol/L 99 97(L) 98  CO2 22 - 32 mmol/L $RemoveB'30 30 29  'KLqZIfRz$ Calcium 8.9 - 10.3 mg/dL 9.6 9.5 9.7  Total Protein 6.5 - 8.1 g/dL 7.5 7.3 7.4  Total Bilirubin 0.3 - 1.2 mg/dL 0.6 0.7 0.6  Alkaline Phos 38 - 126 U/L 64 79 59  AST 15 - 41 U/L $Remo'22 28 30  'zFwql$ ALT 0 - 44 U/L $Remo'17 25 26   'gXfbz$ Lab Results  Component Value Date   CEA1 1.3 03/08/2021   CEA1 1.6 01/18/2021   CEA1 5.9 (H) 11/05/2020    DIAGNOSTIC IMAGING:  I have independently reviewed the scans and discussed with the patient. CT CHEST ABDOMEN PELVIS W CONTRAST  Result Date: 05/05/2021 CLINICAL DATA:  Colon  cancer. Status post right hemicolectomy. Restaging. EXAM: CT CHEST, ABDOMEN, AND PELVIS WITH CONTRAST TECHNIQUE: Multidetector CT imaging of the chest, abdomen and pelvis was performed following the standard protocol during bolus administration of intravenous contrast. CONTRAST:  146mL OMNIPAQUE IOHEXOL 300 MG/ML  SOLN COMPARISON:  02/09/2021. FINDINGS: CT CHEST FINDINGS Cardiovascular: The heart size is normal. No substantial pericardial effusion. Calcification noted in the mitral and aortic valves. Coronary artery calcification is evident. Atherosclerotic calcification is noted in the wall of the thoracic aorta. Left-sided Port-A-Cath tip is positioned in the distal left innominate vein. Mediastinum/Nodes: No mediastinal lymphadenopathy. There is no hilar lymphadenopathy. The esophagus has normal imaging features. There is no axillary lymphadenopathy. Lungs/Pleura: 4 mm right middle lobe perifissural nodule on 82/3 is stable. Posterior right middle lobe pulmonary nodule measuring 3 mm on 95/3 is unchanged in the interval. 7 mm right lower lobe nodule on 81/3 is unchanged. Several additional scattered tiny bilateral pulmonary nodules are similar to prior. No new suspicious nodule or mass. No focal airspace consolidation. There is no evidence of pleural effusion. Musculoskeletal: No worrisome lytic or sclerotic osseous abnormality. CT ABDOMEN PELVIS FINDINGS Hepatobiliary: No suspicious focal abnormality within the liver parenchyma. There is no evidence for gallstones, gallbladder wall thickening, or pericholecystic fluid. No intrahepatic or extrahepatic biliary dilation. Pancreas: No focal mass lesion. No dilatation of the main duct. No intraparenchymal cyst. No peripancreatic edema. Spleen: Tiny hypodensity in the medial spleen is stable at 10 mm. Although this cannot be definitively characterize is most likely benign. Adrenals/Urinary Tract: No adrenal nodule or mass. Stable 14 mm well-defined homogeneous  low-density lesion lower pole right kidney, likely a cyst. Additional scattered tiny hypoattenuating right renal lesions are too small to characterize but likely benign. Exophytic 8 mm lesion upper pole left kidney cannot be definitively characterized but has attenuation higher than would be expected for a simple cyst. This has been present and is unchanged since our oldest comparison study of 04/06/2020. No evidence for hydroureter. Circumferential bladder wall thickening evident. Stomach/Bowel: Stomach is unremarkable. No gastric wall thickening. No evidence of outlet obstruction. Duodenum is normally positioned as is the ligament of Treitz. Duodenal diverticulum noted. No small bowel wall thickening. No small bowel dilatation. Status post right hemicolectomy. No gross colonic mass. No colonic wall thickening. Vascular/Lymphatic: There is abdominal aortic atherosclerosis without aneurysm. There is no gastrohepatic or hepatoduodenal ligament lymphadenopathy. 10 mm short axis portal caval node described previously is unchanged in the interval. Ill-defined soft tissue medial to the inferior tip of the liver measured previously at 3.0 x 2.4 cm now measures 2.5 x 1.8 cm (image 71/2). No retroperitoneal or mesenteric lymphadenopathy. No pelvic sidewall lymphadenopathy. Reproductive: The uterus is unremarkable.  There is  no adnexal mass. Other: No intraperitoneal free fluid. Musculoskeletal: No worrisome lytic or sclerotic osseous abnormality. IMPRESSION: 1. Continued further decrease in size of the ill-defined soft tissue medial to the inferior tip of the right liver. No new or progressive findings to suggest active recurrent or metastatic disease in the chest, abdomen, or pelvis. 2. Stable bilateral pulmonary nodules, consistent with benign etiology based on continued stability. 3. No change in the 10 mm low-density lesion medial spleen, indeterminate. Continued attention on follow-up recommended. 4. Circumferential  bladder wall thickening with similar appearance of right renal cysts. 5. Left-sided Port-A-Cath tip is positioned in the distal left innominate vein. 6. Aortic Atherosclerosis (ICD10-I70.0). Electronically Signed   By: Misty Stanley M.D.   On: 05/05/2021 14:48   Intravitreal Injection, Pharmacologic Agent - OS - Left Eye  Result Date: 05/06/2021 Time Out 05/04/2021. 3:01 PM. Confirmed correct patient, procedure, site, and patient consented. Anesthesia Topical anesthesia was used. Anesthetic medications included Lidocaine 2%, Proparacaine 0.5%. Procedure Preparation included 5% betadine to ocular surface, eyelid speculum. A (33g) needle was used. Injection: 2 mg aflibercept Alfonse Flavors) SOLN   NDC: [id number], Lot: [id number], Expiration date: 01/05/2022   Route: Intravitreal, Site: Left Eye, Waste: 0.05 mL Post-op Post injection exam found visual acuity of at least counting fingers. The patient tolerated the procedure well. There were no complications. The patient received written and verbal post procedure care education. Post injection medications were not given.   OCT, Retina - OU - Both Eyes  Result Date: 05/06/2021 Right Eye Quality was good. Central Foveal Thickness: 521. Progression has been stable. Findings include pigment epithelial detachment, epiretinal membrane, subretinal hyper-reflective material, retinal drusen , abnormal foveal contour, no SRF, intraretinal fluid, disciform scar, outer retinal tubulation, outer retinal atrophy (Stable sub-retinal scar, mild persistent cystic changes overlying). Left Eye Quality was good. Central Foveal Thickness: 349. Progression has been stable. Findings include subretinal hyper-reflective material, pigment epithelial detachment, intraretinal fluid, retinal drusen , outer retinal atrophy, subretinal fluid, normal foveal contour (stable improvement in cystic changes overlying persistent PED / [facility]). Notes *Images captured and stored on drive Diagnosis / Impression:  Exudative ARMD OU OD: Stable sub-retinal scar, mild persistent cystic changes overlying OS: Mild interval improvement in cystic changes overlying persistent PED / [facility] Clinical management: See below Abbreviations: NFP - Normal foveal profile. CME - cystoid macular edema. PED - pigment epithelial detachment. IRF - intraretinal fluid. SRF - subretinal fluid. EZ - ellipsoid zone. ERM - epiretinal membrane. ORA - outer retinal atrophy. ORT - outer retinal tubulation. SRHM - subretinal hyper-reflective material     ASSESSMENT:  1. Stage IIIb (T4AN1A) poorly differentiated right colon adenocarcinoma: -Right hemicolectomy on 04/07/2020 with poorly differentiated adenocarcinoma, pT4a, positive radial margin, 1/15 lymph nodes positive, loss of MLH1 and PMS2, bladder biopsy negative for malignancy. -CTAP on 04/06/2020 showed circumferential ascending colon mass with numerous borderline enlarged pericolonic lymph nodes. No findings of hepatic metastatic disease. -CEA on 04/04/2020 was 12.2. CEA improved to 1.7 on 05/13/2020. -PET scan on 05/11/2020 shows mild FDG uptake associated with the anastomotic site. Small pulmonary nodules that do not show elevated FDG activity, remain nonspecific, subcentimeter. Persistent but improved bladder wall thickening. -Cycle 1 of dose reduced FOLFOX on 05/19/2020, followed by 2 hospitalizations, 1 from acute kidney injury from diarrhea and second admission for C. difficile colitis. -Cycle 2 of 5-FU and leucovorin dose reduced on 06/30/2020.Chemotherapy discontinued secondary to intolerance. -Follow-up CT scan on 11/02/2020 with multiple soft tissue lesions in the central and right mesentery measuring  up to 3.1 x 1.7 cm. Mild lymphadenopathy in the hepatic duodenal ligament, retroperitoneal space, right common iliac chain, bilateral external iliac chains. 17 mm soft tissue nodule in the lower anterior abdominal wall. Bilateral tiny pulmonary nodules not substantially changed. 2.9 x  2.5 cm low-density lesion along the posterior uterus is indeterminate. -Pembrolizumab started on 11/10/2020. -CT CAP from 02/09/2021 showed interval near complete resolution of previously demonstrated nodal mass in the ileocolonic mesentery.  Residual ill-defined nodule medial to the tip of the right hepatic lobe.  Stable small pulmonary nodules bilaterally consistent with benign findings.  2. Family history: -Paternal uncle had colon cancer. Maternal aunt had brain cancer and another maternal aunt had gynecological malignancy. Father had lung cancer and was a smoker. -Genetic testing did not reveal any notable mutations.  3. Diffuse erythema and nodularity of the dome of the bladder: -CT scan showed severely thickened bladder wall. -Cystoscopy on 04/08/2020 showed diffuse erythema, nodularity in the posterior wall tracking to the dome. Ureteral orifices were in normal locations. Biopsies were benign.   PLAN:  1.Stage IV right colon adenocarcinoma, MSI-high: -She has tolerated last cycle without any major problems. - She continues to have red eyes with tearing.  She is being seen by Dr. Coralyn Pear.   - She did not have any immunotherapy related side effects like diarrhea and skin rashes. - Reviewed her labs today which showed normal LFTs.  TSH was 2.53 on 04/20/2021 - Last CEA was 1.3 on 03/08/2021 - Recommend proceeding with Keytruda today and in 3 weeks.  RTC 6 weeks. - Last CT scan on 05/05/2021 showed decrease in size of the ill-defined soft tissue medial to the inferior tip of the right liver. No new or progressive findings to suggest active recurrent or metastatic disease in the chest, abdomen, or pelvis.  2.Paroxysmal atrial fibrillation: -Continue Eliquis.  No bleeding issues. - Watchman procedure at [facility] will be done in May.  3.Hypertension: -Continue Hyzaar and amlodipine.   Orders placed this encounter:  No orders of the defined types were placed in this  encounter.  Ledell Peoples, MD Department of Hematology/Oncology [facility] Phone: [phone] Pager: [phone] Email: [email or web address]

## 2021-05-11 NOTE — Progress Notes (Signed)
Patient presents today for treatment and follow up visit with Dr. Lorenso Courier. Vial signs within parameters for treatment. Labs within parameters for today's treatment. Potassium today 3.1.   Message received from Dr. Lorenso Courier to give 40 mEq PO Potassium x 1 dose.   Treatment given today per MD orders. Tolerated infusion without adverse affects. Vital signs stable. No complaints at this time. Discharged from clinic ambulatory in stable condition. Alert and oriented x 3. F/U with [facility] as scheduled.

## 2021-05-12 LAB — CEA: CEA: 1.3 ng/mL (ref 0.0–4.7)

## 2021-05-17 ENCOUNTER — Other Ambulatory Visit: Payer: Self-pay | Admitting: Family Medicine

## 2021-05-18 NOTE — Telephone Encounter (Signed)
[place]ott[place]. NTBS 30 days given 03/24/21

## 2021-05-28 ENCOUNTER — Other Ambulatory Visit: Payer: Self-pay | Admitting: *Deleted

## 2021-05-28 MED ORDER — POTASSIUM CHLORIDE CRYS ER 20 MEQ PO TBCR: 20.0000 meq | EXTENDED_RELEASE_TABLET | Freq: Every day | ORAL | 0 refills | Status: DC

## 2021-05-28 MED ORDER — ATORVASTATIN CALCIUM 20 MG PO TABS: 1.0000 | ORAL_TABLET | Freq: Every day | ORAL | 0 refills | Status: DC

## 2021-05-28 MED ORDER — AMLODIPINE BESYLATE 5 MG PO TABS: 5.0000 mg | ORAL_TABLET | Freq: Every day | ORAL | 0 refills | Status: DC

## 2021-05-28 NOTE — Telephone Encounter (Signed)
Pt will run out of Atorvastatin, Amlodipine & potassium before her August appt with Dr. Carson Myrtle sent to [facility]

## 2021-06-01 ENCOUNTER — Other Ambulatory Visit: Payer: Self-pay | Admitting: Hematology and Oncology

## 2021-06-01 ENCOUNTER — Inpatient Hospital Stay (HOSPITAL_COMMUNITY): Payer: Medicare HMO

## 2021-06-01 ENCOUNTER — Other Ambulatory Visit (HOSPITAL_COMMUNITY): Payer: Medicare HMO

## 2021-06-01 ENCOUNTER — Other Ambulatory Visit: Payer: Self-pay

## 2021-06-01 VITALS — BP 155/52 | HR 68 | Temp 96.9°F | Resp 18

## 2021-06-01 DIAGNOSIS — I10 Essential (primary) hypertension: Secondary | ICD-10-CM | POA: Diagnosis not present

## 2021-06-01 DIAGNOSIS — C189 Malignant neoplasm of colon, unspecified: Secondary | ICD-10-CM

## 2021-06-01 DIAGNOSIS — I480 Paroxysmal atrial fibrillation: Secondary | ICD-10-CM | POA: Diagnosis not present

## 2021-06-01 DIAGNOSIS — Z79899 Other long term (current) drug therapy: Secondary | ICD-10-CM | POA: Diagnosis not present

## 2021-06-01 DIAGNOSIS — Z5112 Encounter for antineoplastic immunotherapy: Secondary | ICD-10-CM | POA: Diagnosis not present

## 2021-06-01 DIAGNOSIS — C182 Malignant neoplasm of ascending colon: Secondary | ICD-10-CM | POA: Diagnosis not present

## 2021-06-01 LAB — CBC WITH DIFFERENTIAL/PLATELET
Abs Immature Granulocytes: 0.03 10*3/uL (ref 0.00–0.07)
Basophils Absolute: 0 10*3/uL (ref 0.0–0.1)
Basophils Relative: 1 %
Eosinophils Absolute: 0.2 10*3/uL (ref 0.0–0.5)
Eosinophils Relative: 4 %
HCT: 36.2 % (ref 36.0–46.0)
Hemoglobin: 12.1 g/dL (ref 12.0–15.0)
Immature Granulocytes: 1 %
Lymphocytes Relative: 25 %
Lymphs Abs: 1.5 10*3/uL (ref 0.7–4.0)
MCH: 31.8 pg (ref 26.0–34.0)
MCHC: 33.4 g/dL (ref 30.0–36.0)
MCV: 95.3 fL (ref 80.0–100.0)
Monocytes Absolute: 0.6 10*3/uL (ref 0.1–1.0)
Monocytes Relative: 10 %
Neutro Abs: 3.6 10*3/uL (ref 1.7–7.7)
Neutrophils Relative %: 59 %
Platelets: 133 10*3/uL — ABNORMAL LOW (ref 150–400)
RBC: 3.8 MIL/uL — ABNORMAL LOW (ref 3.87–5.11)
RDW: 13 % (ref 11.5–15.5)
WBC: 6 10*3/uL (ref 4.0–10.5)
nRBC: 0 % (ref 0.0–0.2)

## 2021-06-01 LAB — COMPREHENSIVE METABOLIC PANEL
ALT: 18 U/L (ref 0–44)
AST: 23 U/L (ref 15–41)
Albumin: 3.8 g/dL (ref 3.5–5.0)
Alkaline Phosphatase: 58 U/L (ref 38–126)
Anion gap: 7 (ref 5–15)
BUN: 23 mg/dL (ref 8–23)
CO2: 31 mmol/L (ref 22–32)
Calcium: 9.6 mg/dL (ref 8.9–10.3)
Chloride: 99 mmol/L (ref 98–111)
Creatinine, Ser: 1.03 mg/dL — ABNORMAL HIGH (ref 0.44–1.00)
GFR, Estimated: 57 mL/min — ABNORMAL LOW (ref >60)
Glucose, Bld: 102 mg/dL — ABNORMAL HIGH (ref 70–99)
Potassium: 3.4 mmol/L — ABNORMAL LOW (ref 3.5–5.1)
Sodium: 137 mmol/L (ref 135–145)
Total Bilirubin: 0.6 mg/dL (ref 0.3–1.2)
Total Protein: 7.2 g/dL (ref 6.5–8.1)

## 2021-06-01 LAB — TSH: TSH: 2.164 u[IU]/mL (ref 0.350–4.500)

## 2021-06-01 MED ORDER — SODIUM CHLORIDE 0.9 % IV SOLN
200.0000 mg | Freq: Once | INTRAVENOUS | Status: AC
  Administered 2021-06-01: 200 mg via INTRAVENOUS
  Filled 2021-06-01: qty 8

## 2021-06-01 MED ORDER — HEPARIN SOD (PORK) LOCK FLUSH 100 UNIT/ML IV SOLN
500.0000 [IU] | Freq: Once | INTRAVENOUS | Status: AC | PRN
  Administered 2021-06-01: 500 [IU]

## 2021-06-01 MED ORDER — SODIUM CHLORIDE 0.9% FLUSH
10.0000 mL | INTRAVENOUS | Status: DC | PRN
  Administered 2021-06-01: 10 mL

## 2021-06-01 MED ORDER — STERILE WATER FOR INJECTION IJ SOLN
INTRAMUSCULAR | Status: AC
  Filled 2021-06-01: qty 10

## 2021-06-01 MED ORDER — ALTEPLASE 2 MG IJ SOLR
2.0000 mg | Freq: Once | INTRAMUSCULAR | Status: AC
  Administered 2021-06-01: 2 mg

## 2021-06-01 MED ORDER — SODIUM CHLORIDE 0.9 % IV SOLN: Freq: Once | INTRAVENOUS | Status: AC

## 2021-06-01 MED ORDER — ALTEPLASE 2 MG IJ SOLR
INTRAMUSCULAR | Status: AC
  Filled 2021-06-01: qty 2

## 2021-06-01 NOTE — Patient Instructions (Signed)
[facility]  Discharge Instructions: Thank you for choosing [facility] to provide your oncology and hematology care.  If you have a lab appointment with the Cancer Center, please come in thru the Main Entrance and check in at the main information desk.  Wear comfortable clothing and clothing appropriate for easy access to any Portacath or PICC line.   We strive to give you quality time with your provider. You may need to reschedule your appointment if you arrive late (15 or more minutes).  Arriving late affects you and other patients whose appointments are after yours.  Also, if you miss three or more appointments without notifying the office, you may be dismissed from the clinic at the provider's discretion.      For prescription refill requests, have your pharmacy contact our office and allow 72 hours for refills to be completed.        To help prevent nausea and vomiting after your treatment, we encourage you to take your nausea medication as directed.  BELOW ARE SYMPTOMS THAT SHOULD BE REPORTED IMMEDIATELY: *FEVER GREATER THAN 100.4 F (38 C) OR HIGHER *CHILLS OR SWEATING *NAUSEA AND VOMITING THAT IS NOT CONTROLLED WITH YOUR NAUSEA MEDICATION *UNUSUAL SHORTNESS OF BREATH *UNUSUAL BRUISING OR BLEEDING *URINARY PROBLEMS (pain or burning when urinating, or frequent urination) *BOWEL PROBLEMS (unusual diarrhea, constipation, pain near the anus) TENDERNESS IN MOUTH AND THROAT WITH OR WITHOUT PRESENCE OF ULCERS (sore throat, sores in mouth, or a toothache) UNUSUAL RASH, SWELLING OR PAIN  UNUSUAL VAGINAL DISCHARGE OR ITCHING   Items with * indicate a potential emergency and should be followed up as soon as possible or go to the Emergency Department if any problems should occur.  Please show the CHEMOTHERAPY ALERT CARD or IMMUNOTHERAPY ALERT CARD at check-in to the Emergency Department and triage nurse.  Should you have questions after your visit or need to cancel  or reschedule your appointment, please contact [facility] [phone]  and follow the prompts.  Office hours are 8:00 a.m. to 4:30 p.m. Monday - Friday. Please note that voicemails left after 4:00 p.m. may not be returned until the following business day.  We are closed weekends and major holidays. You have access to a nurse at all times for urgent questions. Please call the main number to the clinic [phone] and follow the prompts.  For any non-urgent questions, you may also contact your provider using MyChart. We now offer e-Visits for anyone 18 and older to request care online for non-urgent symptoms. For details visit [email or web address].   Also download the MyChart app! Go to the app store, search "MyChart", open the app, select [facility], and log in with your MyChart username and password.  Due to Covid, a mask is required upon entering the hospital/clinic. If you do not have a mask, one will be given to you upon arrival. For doctor visits, patients may have 1 support person aged 18 or older with them. For treatment visits, patients cannot have anyone with them due to current Covid guidelines and our immunocompromised population.  

## 2021-06-01 NOTE — Progress Notes (Signed)
[facility] Note  06/03/2021     CHIEF COMPLAINT Patient presents for Retina Follow Up   HISTORY OF PRESENT ILLNESS: Gabrielle [name] is a 75 y.o. female who presents to the clinic today for:   HPI     Retina Follow Up   Patient presents with  Wet AMD.  In both eyes.  Duration of 4 weeks.  Since onset it is stable.  I, the attending physician,  performed the HPI with the patient and updated documentation appropriately.        Comments   4 week follow up Exu ARMD OU- Vision appears stable OU. Patient states she is still using Cosopt qd OU.      Last edited by Bernarda Caffey, MD on 06/03/2021  4:12 PM.    pt states she is only using Cosopt once a day, no change in vision  Referring physician: Janora Norlander, DO [place],  [place] [address]  HISTORICAL INFORMATION:   Selected notes from the MEDICAL RECORD NUMBER Referred by Dr. Cristela Blue for concern of ARMD OU   CURRENT MEDICATIONS: Current Outpatient Medications (Ophthalmic Drugs)  Medication Sig   dorzolamide-timolol (COSOPT) 22.3-6.8 MG/ML ophthalmic solution PLACE 1 DROP INTO BOTH EYES 2 TIMES A DAY (Patient taking differently: Place 1 drop into both eyes 1 day or 1 dose.)   No current facility-administered medications for this visit. (Ophthalmic Drugs)   Current Outpatient Medications (Other)  Medication Sig   alendronate (FOSAMAX) 70 MG tablet Take 1 tablet (70 mg total) by mouth every 7 (seven) days. Take with a full glass of water on an empty stomach.   amLODipine (NORVASC) 5 MG tablet Take 1 tablet (5 mg total) by mouth daily.   atorvastatin (LIPITOR) 20 MG tablet Take 1 tablet (20 mg total) by mouth daily.   cholecalciferol (VITAMIN D3) 25 MCG (1000 UT) tablet Take 1,000 Units by mouth daily.   CRANBERRY FRUIT PO Take 1 tablet by mouth daily.   FEROSUL 325 (65 Fe) MG tablet TAKE  (1)  TABLET TWICE A DAY WITH MEALS (BREAKFAST AND SUPPER)   Garlic 160 MG TABS Take by  mouth.   LEUCOVORIN CALCIUM IV Inject into the vein every 14 (fourteen) days.    lidocaine-prilocaine (EMLA) cream Apply a small amount to port a cath site and cover with plastic wrap 1 hour prior to chemotherapy appointments   losartan-hydrochlorothiazide (HYZAAR) 100-25 MG tablet Take 1 tablet by mouth daily.   OXALIPLATIN IV Inject into the vein every 14 (fourteen) days.    potassium chloride SA (KLOR-CON) 20 MEQ tablet Take 1 tablet (20 mEq total) by mouth daily.   prochlorperazine (COMPAZINE) 10 MG tablet Take 1 tablet (10 mg total) by mouth every 6 (six) hours as needed for nausea or vomiting.   diclofenac Sodium (VOLTAREN) 1 % GEL APPLY 4 GRAMS TO AFFECTED AREA(S) 4 TIMES A DAY   ELIQUIS 5 MG TABS tablet Take 5 mg by mouth 2 (two) times daily. (Patient not taking: Reported on 06/03/2021)   No current facility-administered medications for this visit. (Other)      REVIEW OF SYSTEMS: ROS   Positive for: Gastrointestinal, Genitourinary, Musculoskeletal, Cardiovascular, Eyes Negative for: Constitutional, Neurological, Skin, HENT, Endocrine, Respiratory, Psychiatric, Allergic/Imm, Heme/Lymph Last edited by Leonie Douglas, COA on 06/03/2021  8:01 AM.        ALLERGIES Allergies  Allergen Reactions   Lisinopril Cough    PAST MEDICAL HISTORY Past Medical History:  Diagnosis Date   Arthritis    Cancer [facility])    Cataract    OU   Colon cancer ([place])    Family history of pancreatic cancer 08/21/2020   Family history of uterine cancer 08/21/2020   H/O cesarean section    Hx of tonsillectomy    Hyperlipidemia    Hypertension    Macular degeneration    Exu ARMD OU   Paroxysmal atrial fibrillation ([place]) 07/05/2020   Past Surgical History:  Procedure Laterality Date   BIOPSY  04/06/2020   Procedure: BIOPSY;  Surgeon: Rogene Houston, MD;  Location: [facility];  Service: Endoscopy;;   CARPAL TUNNEL RELEASE Right    CATARACT EXTRACTION W/PHACO Left 10/11/2019   Procedure: CATARACT  EXTRACTION PHACO AND INTRAOCULAR LENS PLACEMENT ([place]);  Surgeon: Baruch Goldmann, MD;  Location: [facility] ORS;  Service: Ophthalmology;  Laterality: Left;  CDE: 8.56   CATARACT EXTRACTION W/PHACO Right 10/25/2019   Procedure: CATARACT EXTRACTION PHACO AND INTRAOCULAR LENS PLACEMENT (IOC);  Surgeon: Baruch Goldmann, MD;  Location: [facility] OR[facility];  Service: Ophthalmology;  Laterality: Right;  CDE: 5.67   CESAREAN SECTION     COLONOSCOPY N/A 04/06/2020   Procedure: COLONOSCOPY;  Surgeon: Rogene Houston, MD;  Location: [facility];  Service: Endoscopy;  Laterality: N/A;   CYSTOSCOPY WITH BIOPSY N/A 04/08/2020   Procedure: CYSTOSCOPY WITH BIOPSY;  Surgeon: Cleon Gustin, MD;  Location: [facility] ORS;  Service: Urology;  Laterality: N/A;   ESOPHAGOGASTRODUODENOSCOPY N/A 04/05/2020   Procedure: ESOPHAGOGASTRODUODENOSCOPY (EGD);  Surgeon: Rogene Houston, MD;  [facility];  Service: Endoscopy;  Laterality: N/A;   PARTIAL COLECTOMY N/A 04/08/2020   Procedure: PARTIAL COLECTOMY;  Surgeon: Virl Cagey, MD;  Location: [facility] OR[facility];  Service: General;  Laterality: N/A;   PORTACATH PLACEMENT Left 05/18/2020   Procedure: INSERTION PORT-A-CATH (ATTACHED CATHETER IN LEFT SUBCLAVIAN);  Surgeon: Virl Cagey, MD;  Location: [facility];  Service: General;  Laterality: Left;   TONSILLECTOMY      FAMILY HISTORY Family History  Problem Relation Age of Onset   Macular degeneration Mother    Stroke Mother    Hypertension Mother    Dementia Mother    Lung cancer Father        dx late 13s; smoking hx   Diabetes Sister    Hypertension Sister    Leukemia Paternal Uncle        d. 71s   Cancer Maternal Aunt        ovarian or endometrial dx 66s   Pancreatic cancer Paternal Uncle        d. late 58s    SOCIAL HISTORY Social History   Tobacco Use   Smoking status: Never   Smokeless tobacco: Never  Vaping Use   Vaping Use: Never used  Substance Use Topics   Alcohol use: No   Drug use: No         OPHTHALMIC  EXAM:  Base Eye Exam     Visual Acuity (Snellen - Linear)       Right Left   Dist cc CF 3' 20/100-   Dist ph cc NI NI    Correction: Glasses  OS- Omitted first 1-2 letters of each line.        Tonometry (Tonopen, 8:08 AM)       Right Left   Pressure 20 22  Still only using Cosopt qd OU.  Last used last night.         Pupils  Dark Light Shape React APD   Right 4 3 Round Brisk None   Left 4 3 Round Brisk None         Visual Fields (Counting fingers)       Left Right    Full Full         Extraocular Movement       Right Left    Full Full         Neuro/Psych     Oriented x3: Yes   Mood/Affect: Normal         Dilation     Both eyes: 1.0% Mydriacyl, 2.5% Phenylephrine @ 8:08 AM           Slit Lamp and Fundus Exam     Slit Lamp Exam       Right Left   Lids/Lashes Dermatochalasis - upper lid Dermatochalasis - upper lid   Conjunctiva/Sclera 1+ Injection White and quiet   Cornea Arcus, 2-3+ fine Punctate epithelial erosions Arcus, 2+ fine Punctate epithelial erosions, mild tear film debris   Anterior Chamber Deep and quiet Deep and quiet   Iris Round and well dilated Round and well dilated   Lens PCIOL in good position, 1+ Posterior capsular opacification PC IOL in good position with open PC   Vitreous Vitreous syneresis, Posterior vitreous detachment, vitreous condensations Vitreous syneresis, Posterior vitreous detachment, silicone oil micro drops         Fundus Exam       Right Left   Disc pink and sharp, compact pink and sharp, Compact, vascular loops superiorly   C/D Ratio 0.2 0.2   Macula Blunted foveal reflex, Drusen, Pigment clumping and atrophy, central thickening/pigmented disciform scar, +PED, no heme, persistent, trace cystic changes overlying central scar - improved Blunted foveal reflex, central thickening, RPE clumping and atrophy, Drusen, RPE rip, trace cystic changes overlying PED -- stably improved, no heme    Vessels Vascular attenuation, Tortuous Vascular attenuation, Tortuous   Periphery Attached, mild Reticular degeneration Attached, mild Reticular degeneration           Refraction     Wearing Rx       Sphere Cylinder Add   Right [place] Sphere +3.50   Left -0.50 Sphere +3.50    Type: PAL            IMAGING AND PROCEDURES  Imaging and Procedures for 03/21/18  OCT, Retina - OU - Both Eyes       Right Eye Quality was good. Central Foveal Thickness: 558. Progression has been stable. Findings include pigment epithelial detachment, epiretinal membrane, subretinal hyper-reflective material, retinal drusen , abnormal foveal contour, no SRF, intraretinal fluid, disciform scar, outer retinal tubulation, outer retinal atrophy (Stable sub-retinal scar, mild persistent cystic changes overlying).   Left Eye Quality was good. Central Foveal Thickness: 313. Progression has been stable. Findings include subretinal hyper-reflective material, pigment epithelial detachment, intraretinal fluid, retinal drusen , outer retinal atrophy, normal foveal contour, no SRF (Trace cystic changes / IRF).   Notes *Images captured and stored on drive  Diagnosis / Impression:  Exudative ARMD OU OD: Stable sub-retinal scar, mild persistent cystic changes overlying OS: Trace cystic changes / IRF overlying SRHM  Clinical management:  See below  Abbreviations: NFP - Normal foveal profile. CME - cystoid macular edema. PED - pigment epithelial detachment. IRF - intraretinal fluid. SRF - subretinal fluid. EZ - ellipsoid zone. ERM - epiretinal membrane. ORA - outer retinal atrophy. ORT - outer retinal tubulation. SRHM - subretinal hyper-reflective  material      Intravitreal Injection, Pharmacologic Agent - OS - Left Eye       Time Out 06/03/2021. 8:06 AM. Confirmed correct patient, procedure, site, and patient consented.   Anesthesia Topical anesthesia was used. Anesthetic medications included Lidocaine  2%, Proparacaine 0.5%.   Procedure Preparation included 5% betadine to ocular surface, eyelid speculum. A (33g) needle was used.   Injection: 2 mg aflibercept 2 MG/0.05ML   Route: Intravitreal, Site: Left Eye   NDC: A3590391, Lot: [id number], Expiration date: 04/03/2022, Waste: 0.05 mL   Post-op Post injection exam found visual acuity of at least counting fingers. The patient tolerated the procedure well. There were no complications. The patient received written and verbal post procedure care education. Post injection medications were not given.               ASSESSMENT/PLAN:    ICD-10-CM   1. Exudative age-related macular degeneration of both eyes with active choroidal neovascularization (HCC)  H35.3231 Intravitreal Injection, Pharmacologic Agent - OS - Left Eye    aflibercept (EYLEA) SOLN 2 mg    2. Retinal edema  H35.81 OCT, Retina - OU - Both Eyes    3. Posterior vitreous detachment of both eyes  H43.813     4. Pseudophakia of both eyes  Z96.1     5. Ocular hypertension, bilateral  H40.053        1,2. Exudative age related macular degeneration, both eyes.    - severe exudative disease with very active CNVM OU at presentation in January 2019  - S/P IVA OD #1 (01.04.19), #2 (02.15.19)  - S/P IVA OS #1 (01.18.19), #2 (02.15.19)  - switched to Eylea 3.18.19 due to severity of disease  - S/P IVE OD #1 (03.18.19), #2 (04.17.19), #3 (05.15.19), #4 (10.02.19),  #5 (01.15.20), #6 (06.04.20), #7 (10.29.20), #8 (01.21.21), #9 (05.27.21), #10 (07.22.21) -- injections held due to stable disciform scar  - S/P IVE OS #1 (03.18.19), #2 (04.17.19), #3 (05.15.19), #4 (06.12.19), #5 (07.10.19), #6 (08.07.19), #7 (09.04.19), #8 (10.02.19), #9 (11.06.19), #10 (12.11.19), #11 (01.15.20), #12 (02.20.20), #13 (03.26.20), #14 (04.30.20), #15 (06.04.20), #16 (07.16.20), # 17 (08.20.20), #18 (09.24.20), #19 (10.29.20), #20 (12.03.20), #21 (01.21.21), #22 (03.18.21), #23 (05.27.21), #24  (07.22.21), #25 (11.11.21), #26 (12.09.21), #27 (01.06.22), #28 (2.7.22), #29 (03.24.22), #30 (4.28.22), #31 (05.31.22)  - IVE OS held from 7.22.21 to 11.11.21 due to TIA/stroke on 8.1.21  - OCT OS: Trace cystic changes / IRF  - exam OS shows macular heme stably resolved  - OD with significant subretinal disciform scar - tr persistent cystic changes overlying IRHM  - BCVA OD stable at CF 3'; OS stable at 20/100   - recommend IVE OS #32 today, 06.30.22 -- maintenance w/ 5 wk f/u - will cont to hold OD  - Eylea informed consent form signed and scanned on 01.21.2021  - Eylea4U paperwork filled out on 02.15.19 and fully approved through [employer] -- approved  - f/u in 5-6 weeks, sooner prn for DFE/OCT/possible injection(s)  3. PVD / vitreous syneresis  - Discussed findings and prognosis   - No RT or RD on 360 exam  - Reviewed s/s of RT/RD  - Strict return precautions for any such RT/RD signs/symptoms  4. Pseudophakia OU  - s/p CE/IOL (Dr. Marisa Hua, 11.2020)  - beautiful surgeries w/ IOLs in excellent position, doing well  - monitor  5. Ocular Hypertension OU  - IOP 20,22  - pt has not increased Cosopt to BID -- still using  qdaily  - inc Cosopt to BID OU   Ophthalmic Meds Ordered this visit:  Meds ordered this encounter  Medications   aflibercept (EYLEA) SOLN 2 mg        Return in about 5 weeks (around 07/08/2021) for f/u exu ARMD OU, DFE, OCT.  There are no Patient Instructions on file for this visit.  This document serves as a record of services personally performed by Gardiner Sleeper, MD, PhD. It was created on their behalf by Leonie Douglas, an ophthalmic technician. The creation of this record is the provider's dictation and/or activities during the visit.    Electronically signed by: Leonie Douglas COA, 06/03/21  4:15 PM  This document serves as a record of services personally performed by Gardiner Sleeper, MD, PhD. It was created on their behalf by San Jetty. Owens Shark, OA an  ophthalmic technician. The creation of this record is the provider's dictation and/or activities during the visit.    Electronically signed by: San Jetty. Owens Shark, [place] 06.30.2022 4:15 PM  Gardiner Sleeper, M.D., Ph.D. Diseases & Surgery of the Retina and [place] 06/03/2021   I have reviewed the above documentation for accuracy and completeness, and I agree with the above. Gardiner Sleeper, M.D., Ph.D. 06/03/21 4:15 PM  Abbreviations: M myopia (nearsighted); A astigmatism; H hyperopia (farsighted); P presbyopia; Mrx spectacle prescription;  CTL contact lenses; OD right eye; OS left eye; OU both eyes  XT exotropia; ET esotropia; PEK punctate epithelial keratitis; PEE punctate epithelial erosions; DES dry eye syndrome; MGD meibomian gland dysfunction; ATs artificial tears; PFAT's preservative free artificial tears; [place] nuclear sclerotic cataract; PSC posterior subcapsular cataract; ERM epi-retinal membrane; PVD posterior vitreous detachment; RD retinal detachment; DM diabetes mellitus; DR diabetic retinopathy; NPDR non-proliferative diabetic retinopathy; PDR proliferative diabetic retinopathy; CSME clinically significant macular edema; DME diabetic macular edema; dbh dot blot hemorrhages; CWS cotton wool spot; POAG primary open angle glaucoma; C/D cup-to-disc ratio; HVF humphrey visual field; GVF goldmann visual field; OCT optical coherence tomography; IOP intraocular pressure; BRVO Branch retinal vein occlusion; CRVO central retinal vein occlusion; CRAO central retinal artery occlusion; BRAO branch retinal artery occlusion; RT retinal tear; SB scleral buckle; PPV pars plana vitrectomy; VH Vitreous hemorrhage; PRP panretinal laser photocoagulation; IVK intravitreal kenalog; VMT vitreomacular traction; MH Macular hole;  NVD neovascularization of the disc; NVE neovascularization elsewhere; AREDS age related eye disease study; ARMD age related macular degeneration; POAG primary  open angle glaucoma; EBMD epithelial/anterior basement membrane dystrophy; ACIOL anterior chamber intraocular lens; IOL intraocular lens; PCIOL posterior chamber intraocular lens; Phaco/IOL phacoemulsification with intraocular lens placement; [place] photorefractive keratectomy; LASIK laser assisted in situ keratomileusis; HTN hypertension; DM diabetes mellitus; COPD chronic obstructive pulmonary disease

## 2021-06-01 NOTE — Progress Notes (Signed)
Patient presents today for treatment per orders.  Patient tolerated treatment well with no complaints voiced.  Patient left ambulatory in stable condition.  Vital signs stable at discharge.  Follow up as scheduled.    

## 2021-06-03 ENCOUNTER — Other Ambulatory Visit: Payer: Self-pay

## 2021-06-03 ENCOUNTER — Ambulatory Visit (INDEPENDENT_AMBULATORY_CARE_PROVIDER_SITE_OTHER): Payer: Medicare HMO | Admitting: Ophthalmology

## 2021-06-03 ENCOUNTER — Encounter (INDEPENDENT_AMBULATORY_CARE_PROVIDER_SITE_OTHER): Payer: Self-pay | Admitting: Ophthalmology

## 2021-06-03 DIAGNOSIS — H353231 Exudative age-related macular degeneration, bilateral, with active choroidal neovascularization: Secondary | ICD-10-CM | POA: Diagnosis not present

## 2021-06-03 DIAGNOSIS — H40053 Ocular hypertension, bilateral: Secondary | ICD-10-CM | POA: Diagnosis not present

## 2021-06-03 DIAGNOSIS — H43813 Vitreous degeneration, bilateral: Secondary | ICD-10-CM

## 2021-06-03 DIAGNOSIS — Z961 Presence of intraocular lens: Secondary | ICD-10-CM | POA: Diagnosis not present

## 2021-06-03 DIAGNOSIS — H3581 Retinal edema: Secondary | ICD-10-CM | POA: Diagnosis not present

## 2021-06-03 MED ORDER — AFLIBERCEPT 2MG/0.05ML IZ SOLN FOR KALEIDOSCOPE
2.0000 mg | INTRAVITREAL | Status: AC | PRN
  Administered 2021-06-03: 2 mg via INTRAVITREAL

## 2021-06-21 ENCOUNTER — Other Ambulatory Visit (HOSPITAL_COMMUNITY): Payer: Self-pay | Admitting: *Deleted

## 2021-06-21 DIAGNOSIS — C189 Malignant neoplasm of colon, unspecified: Secondary | ICD-10-CM

## 2021-06-21 NOTE — Progress Notes (Signed)
[facility] [address], [place] [address]   CLINIC:  Medical Oncology/Hematology  PCP:  Gabrielle Ip, DO [address] [place] [address] [phone]   REASON FOR VISIT:  Follow-up for right colon cancer  PRIOR THERAPY:  1. Right hemicolectomy on 04/08/2020. 2. FOLFOX x 3 cycles from 05/19/2020 to 07/14/2020.  NGS Results: Foundation 1 KRAS/NRAS wild-type, MSI--high, TMB 55 Muts/Mb, BRAF V 600 E  CURRENT THERAPY: Keytruda every 3 weeks  BRIEF ONCOLOGIC HISTORY:  Oncology History  Malignant neoplasm of colon (HCC)  04/07/2020 Initial Diagnosis   Malignant neoplasm of ascending colon (HCC)    05/13/2020 Genetic Testing   Foundation One     05/19/2020 - 07/02/2020 Chemotherapy   The patient had palonosetron (ALOXI) injection 0.25 mg, 0.25 mg, Intravenous,  Once, 2 of 12 cycles Administration: 0.25 mg (05/19/2020), 0.25 mg (06/30/2020) leucovorin 522 mg in dextrose 5 % 250 mL infusion, 320 mg/m2 = 522 mg (80 % of original dose 400 mg/m2), Intravenous,  Once, 2 of 12 cycles Dose modification: 320 mg/m2 (80 % of original dose 400 mg/m2, Cycle 1, Reason: Provider Judgment), 213.4875 mg/m2 (66.7 % of original dose 400 mg/m2, Cycle 2, Reason: Provider Judgment) Administration: 522 mg (05/19/2020), 434 mg (06/30/2020) oxaliplatin (ELOXATIN) 110 mg in dextrose 5 % 500 mL chemo infusion, 68 mg/m2 = 110 mg (80 % of original dose 85 mg/m2), Intravenous,  Once, 1 of 1 cycle Dose modification: 68 mg/m2 (80 % of original dose 85 mg/m2, Cycle 1, Reason: Provider Judgment) Administration: 110 mg (05/19/2020) fluorouracil (ADRUCIL) chemo injection 500 mg, 320 mg/m2 = 500 mg (80 % of original dose 400 mg/m2), Intravenous,  Once, 2 of 12 cycles Dose modification: 320 mg/m2 (80 % of original dose 400 mg/m2, Cycle 1, Reason: Provider Judgment), 032.5601 mg/m2 (66.7 % of original dose 400 mg/m2, Cycle 2, Reason: Provider Judgment) Administration: 500 mg (05/19/2020), 450  mg (06/30/2020) fluorouracil (ADRUCIL) 3,150 mg in sodium chloride 0.9 % 87 mL chemo infusion, 1,920 mg/m2 = 3,150 mg (80 % of original dose 2,400 mg/m2), Intravenous, 1 Day/Dose, 2 of 12 cycles Dose modification: 1,920 mg/m2 (80 % of original dose 2,400 mg/m2, Cycle 1, Reason: Provider Judgment), 1,600 mg/m2 (66.7 % of original dose 2,400 mg/m2, Cycle 2, Reason: Provider Judgment) Administration: 3,150 mg (05/19/2020), 2,600 mg (06/30/2020)   for chemotherapy treatment.     08/03/2020 Genetic Testing   Guardant Reveal Testing     08/14/2020 Genetic Testing   No pathogenic variants detected in [facility] Common Hereditary Cancers Panel.  Variant of uncertain significance (VUS) detected in HOXB13 at c.634G>A (p.Ala212Thr). The Common Hereditary Cancers Panel offered by [facility] includes sequencing and/or deletion duplication testing of the following 48 genes: APC, ATM, AXIN2, BARD1, BMPR1A, BRCA1, BRCA2, BRIP1, CDH1, CDK4, CDKN2A (p14ARF), CDKN2A (p16INK4a), CHEK2, CTNNA1, DICER1, EPCAM (Deletion/duplication testing only), FLCN, GREM1 (promoter region deletion/duplication testing only), KIT, MEN1, MLH1, MSH2, MSH3, MSH6, MUTYH, NBN, NF1, NHTL1, PALB2, PDGFRA, PMS2, POLD1, POLE, PTEN, RAD50, RAD51C, RAD51D, RNF43, SDHB, SDHC, SDHD, SMAD4, SMARCA4. STK11, TP53, TSC1, TSC2, and VHL.  The following genes were evaluated for sequence changes only: SDHA and HOXB13 c.251G>A variant only. The report date is August 14, 2020.    11/05/2020 Cancer Staging   Staging form: Colon and Rectum, AJCC 8th Edition - Pathologic [name] from 11/05/2020: [name] IVC (rpTX, pN0, pM1c) - Signed by Doreatha Massed, MD on 11/05/2020    11/10/2020 -  Chemotherapy    Patient is on Treatment  Plan: COLORECTAL PEMBROLIZUMAB Q21D         CANCER STAGING: Cancer Staging Malignant neoplasm of colon [facility]) Staging form: Colon and Rectum, AJCC 8th Edition - Clinical [name] from 04/27/2020: [name] (cT4a, cN1a, cM0) - Unsigned -  Pathologic [name] from 11/05/2020: [name] IVC (rpTX, pN0, pM1c) - Signed by Derek Jack, MD on 11/05/2020  Virtual Visit via Video Note  I connected with Gabrielle [name] on @TODAY @ at  1:00 PM EDT by a video enabled telemedicine application and verified that I am speaking with the correct person using two identifiers.  Location: Patient: clinic Provider: home  Others present: Renda Rolls, RN   I discussed the limitations of evaluation and management by telemedicine and the availability of in person appointments. The patient expressed understanding and agreed to proceed.  INTERVAL HISTORY:  Gabrielle [name], a 75 y.o. female, returns for routine follow-up and consideration for next cycle of chemotherapy. Gabrielle [name] was last seen on 03/30/21.  Due for cycle #10 of Keytruda today.   Overall, she tells me she has been feeling pretty well. She reports increased levels of activity and that she is drinking plenty of fluids.   Overall, she feels ready for next cycle of chemo today.   REVIEW OF SYSTEMS:  Review of Systems  Constitutional:  Negative for fatigue (improved).   PAST MEDICAL/SURGICAL HISTORY:  Past Medical History:  Diagnosis Date   Arthritis    Cancer [facility])    Cataract    OU   Colon cancer ([place])    Family history of pancreatic cancer 08/21/2020   Family history of uterine cancer 08/21/2020   H/O cesarean section    Hx of tonsillectomy    Hyperlipidemia    Hypertension    Macular degeneration    Exu ARMD OU   Paroxysmal atrial fibrillation ([place]) 07/05/2020   Past Surgical History:  Procedure Laterality Date   BIOPSY  04/06/2020   Procedure: BIOPSY;  Surgeon: Rogene Houston, MD;  Location: [facility];  Service: Endoscopy;;   CARPAL TUNNEL RELEASE Right    CATARACT EXTRACTION W/PHACO Left 10/11/2019   Procedure: CATARACT EXTRACTION PHACO AND INTRAOCULAR LENS PLACEMENT ([place]);  Surgeon: Baruch Goldmann, MD;  Location: [facility] ORS;  Service: Ophthalmology;  Laterality:  Left;  CDE: 8.56   CATARACT EXTRACTION W/PHACO Right 10/25/2019   Procedure: CATARACT EXTRACTION PHACO AND INTRAOCULAR LENS PLACEMENT (IOC);  Surgeon: Baruch Goldmann, MD;  Location: [facility] ORS;  Service: Ophthalmology;  Laterality: Right;  CDE: 5.67   CESAREAN SECTION     COLONOSCOPY N/A 04/06/2020   Procedure: COLONOSCOPY;  Surgeon: Rogene Houston, MD;  Location: [facility] SU[facility];  Service: Endoscopy;  Laterality: N/A;   CYSTOSCOPY WITH BIOPSY N/A 04/08/2020   Procedure: CYSTOSCOPY WITH BIOPSY;  Surgeon: Cleon Gustin, MD;  Location: [facility] ORS;  Service: Urology;  Laterality: N/A;   ESOPHAGOGASTRODUODENOSCOPY N/A 04/05/2020   Procedure: ESOPHAGOGASTRODUODENOSCOPY (EGD);  Surgeon: Rogene Houston, MD;  Location: [facility];  Service: Endoscopy;  Laterality: N/A;   PARTIAL COLECTOMY N/A 04/08/2020   Procedure: PARTIAL COLECTOMY;  Surgeon: Virl Cagey, MD;  Location: [facility] ORS;  Service: General;  Laterality: N/A;   PORTACATH PLACEMENT Left 05/18/2020   Procedure: INSERTION PORT-A-CATH (ATTACHED CATHETER IN LEFT SUBCLAVIAN);  Surgeon: Virl Cagey, MD;  Location: [facility] ORS;  Service: General;  Laterality: Left;   TONSILLECTOMY      SOCIAL HISTORY:  Social History   Socioeconomic History   Marital status: Divorced  Spouse name: Not on file   Number of children: Not on file   Years of education: Not on file   Highest education level: Not on file  Occupational History   Not on file  Tobacco Use   Smoking status: Never   Smokeless tobacco: Never  Vaping Use   Vaping Use: Never used  Substance and Sexual Activity   Alcohol use: No   Drug use: No   Sexual activity: Not Currently  Other Topics Concern   Not on file  Social History Narrative   Not on file   Social Determinants of Health   Financial Resource Strain: Not on file  Food Insecurity: Not on file  Transportation Needs: Not on file  Physical Activity: Not on file  Stress: Not on file  Social Connections: Not on file   Intimate Partner Violence: Not on file    FAMILY HISTORY:  Family History  Problem Relation Age of Onset   Macular degeneration Mother    Stroke Mother    Hypertension Mother    Dementia Mother    Lung cancer Father        dx late 12s; smoking hx   Diabetes Sister    Hypertension Sister    Leukemia Paternal Uncle        d. 62s   Cancer Maternal Aunt        ovarian or endometrial dx 33s   Pancreatic cancer Paternal Uncle        d. late 5s    CURRENT MEDICATIONS:  Current Outpatient Medications  Medication Sig Dispense Refill   alendronate (FOSAMAX) 70 MG tablet Take 1 tablet (70 mg total) by mouth every 7 (seven) days. Take with a full glass of water on an empty stomach. 4 tablet 11   amLODipine (NORVASC) 5 MG tablet Take 1 tablet (5 mg total) by mouth daily. 90 tablet 0   atorvastatin (LIPITOR) 20 MG tablet Take 1 tablet (20 mg total) by mouth daily. 90 tablet 0   cholecalciferol (VITAMIN D3) 25 MCG (1000 UT) tablet Take 1,000 Units by mouth daily.     CRANBERRY FRUIT PO Take 1 tablet by mouth daily.     diclofenac Sodium (VOLTAREN) 1 % GEL APPLY 4 GRAMS TO AFFECTED AREA(S) 4 TIMES A DAY 400 g 2   dorzolamide-timolol (COSOPT) 22.3-6.8 MG/ML ophthalmic solution PLACE 1 DROP INTO BOTH EYES 2 TIMES A DAY (Patient taking differently: Place 1 drop into both eyes 1 day or 1 dose.) 10 mL 0   ELIQUIS 5 MG TABS tablet Take 5 mg by mouth 2 (two) times daily. (Patient not taking: Reported on 06/03/2021)     FEROSUL 325 (65 Fe) MG tablet TAKE  (1)  TABLET TWICE A DAY WITH MEALS (BREAKFAST AND SUPPER) 161 tablet 0   Garlic 096 MG TABS Take by mouth.     LEUCOVORIN CALCIUM IV Inject into the vein every 14 (fourteen) days.      lidocaine-prilocaine (EMLA) cream Apply a small amount to port a cath site and cover with plastic wrap 1 hour prior to chemotherapy appointments 30 g 0   losartan-hydrochlorothiazide (HYZAAR) 100-25 MG tablet Take 1 tablet by mouth daily. 90 tablet 3   OXALIPLATIN IV  Inject into the vein every 14 (fourteen) days.      potassium chloride SA (KLOR-CON) 20 MEQ tablet Take 1 tablet (20 mEq total) by mouth daily. 90 tablet 0   prochlorperazine (COMPAZINE) 10 MG tablet Take 1 tablet (10 mg total) by  mouth every 6 (six) hours as needed for nausea or vomiting. 30 tablet 0   No current facility-administered medications for this visit.    ALLERGIES:  Allergies  Allergen Reactions   Lisinopril Cough   Performance status (ECOG): 1 - Symptomatic but completely ambulatory  There were no vitals filed for this visit. Wt Readings from Last 3 Encounters:  06/01/21 169 lb 12.8 oz (77 kg)  05/11/21 168 lb 3.2 oz (76.3 kg)  04/20/21 163 lb 8 oz (74.2 kg)   LABORATORY DATA:  I have reviewed the labs as listed.  CBC Latest Ref Rng & Units 06/01/2021 05/11/2021 04/20/2021  WBC 4.0 - 10.5 K/uL 6.0 5.4 4.9  Hemoglobin 12.0 - 15.0 g/dL 12.1 12.2 12.1  Hematocrit 36.0 - 46.0 % 36.2 36.4 36.2  Platelets 150 - 400 K/uL 133(L) 140(L) 130(L)   CMP Latest Ref Rng & Units 06/01/2021 05/11/2021 04/20/2021  Glucose 70 - 99 mg/dL 102(H) 94 92  BUN 8 - 23 mg/dL 23 23 24(H)  Creatinine 0.44 - 1.00 mg/dL 1.03(H) 0.96 1.03(H)  Sodium 135 - 145 mmol/L 137 137 136  Potassium 3.5 - 5.1 mmol/L 3.4(L) 3.1(L) 3.4(L)  Chloride 98 - 111 mmol/L 99 99 97(L)  CO2 22 - 32 mmol/L $RemoveB'31 30 30  'QYAONcKP$ Calcium 8.9 - 10.3 mg/dL 9.6 9.6 9.5  Total Protein 6.5 - 8.1 g/dL 7.2 7.5 7.3  Total Bilirubin 0.3 - 1.2 mg/dL 0.6 0.6 0.7  Alkaline Phos 38 - 126 U/L 58 64 79  AST 15 - 41 U/L $Remo'23 22 28  'fPmuB$ ALT 0 - 44 U/L $Remo'18 17 25    'JfXiE$ DIAGNOSTIC IMAGING:  I have independently reviewed the scans and discussed with the patient. Intravitreal Injection, Pharmacologic Agent - OS - Left Eye  Result Date: 06/03/2021 Time Out 06/03/2021. 8:06 AM. Confirmed correct patient, procedure, site, and patient consented. Anesthesia Topical anesthesia was used. Anesthetic medications included Lidocaine 2%, Proparacaine 0.5%. Procedure  Preparation included 5% betadine to ocular surface, eyelid speculum. A (33g) needle was used. Injection: 2 mg aflibercept 2 MG/0.05ML   Route: Intravitreal, Site: Left Eye   NDC: [id number], Lot: [id number], Expiration date: 04/03/2022, Waste: 0.05 mL Post-op Post injection exam found visual acuity of at least counting fingers. The patient tolerated the procedure well. There were no complications. The patient received written and verbal post procedure care education. Post injection medications were not given.   OCT, Retina - OU - Both Eyes  Result Date: 06/03/2021 Right Eye Quality was good. Central Foveal Thickness: 558. Progression has been stable. Findings include pigment epithelial detachment, epiretinal membrane, subretinal hyper-reflective material, retinal drusen , abnormal foveal contour, no SRF, intraretinal fluid, disciform scar, outer retinal tubulation, outer retinal atrophy (Stable sub-retinal scar, mild persistent cystic changes overlying). Left Eye Quality was good. Central Foveal Thickness: 313. Progression has been stable. Findings include subretinal hyper-reflective material, pigment epithelial detachment, intraretinal fluid, retinal drusen , outer retinal atrophy, normal foveal contour, no SRF (Trace cystic changes / IRF). Notes *Images captured and stored on drive Diagnosis / Impression: Exudative ARMD OU OD: Stable sub-retinal scar, mild persistent cystic changes overlying OS: Trace cystic changes / IRF overlying SRHM Clinical management: See below Abbreviations: NFP - Normal foveal profile. CME - cystoid macular edema. PED - pigment epithelial detachment. IRF - intraretinal fluid. SRF - subretinal fluid. EZ - ellipsoid zone. ERM - epiretinal membrane. ORA - outer retinal atrophy. ORT - outer retinal tubulation. SRHM - subretinal hyper-reflective material     ASSESSMENT:  1.  [name]  IIIb [facility]) poorly differentiated right colon adenocarcinoma: -Right hemicolectomy on 04/07/2020 with  poorly differentiated adenocarcinoma, pT4a, positive radial margin, 1/15 lymph nodes positive, loss of MLH1 and PMS2, bladder biopsy negative for malignancy. -CTAP on 04/06/2020 showed circumferential ascending colon mass with numerous borderline enlarged pericolonic lymph nodes.  No findings of hepatic metastatic disease. -CEA on 04/04/2020 was 12.2.  CEA improved to 1.7 on 05/13/2020. -PET scan on 05/11/2020 shows mild FDG uptake associated with the anastomotic site.  Small pulmonary nodules that do not show elevated FDG activity, remain nonspecific, subcentimeter.  Persistent but improved bladder wall thickening. -Cycle 1 of dose reduced FOLFOX on 05/19/2020, followed by 2 hospitalizations, 1 from acute kidney injury from diarrhea and second admission for C. difficile colitis. -Cycle 2 of 5-FU and leucovorin dose reduced on 06/30/2020.  Chemotherapy discontinued secondary to intolerance. -Follow-up CT scan on 11/02/2020 with multiple soft tissue lesions in the central and right mesentery measuring up to 3.1 x 1.7 cm.  Mild lymphadenopathy in the hepatic duodenal ligament, retroperitoneal space, right common iliac chain, bilateral external iliac chains.  17 mm soft tissue nodule in the lower anterior abdominal wall.  Bilateral tiny pulmonary nodules not substantially changed.  2.9 x 2.5 cm low-density lesion along the posterior uterus is indeterminate. -Pembrolizumab started on 11/10/2020. -CT CAP from 02/09/2021 showed interval near complete resolution of previously demonstrated nodal mass in the ileocolonic mesentery.  Residual ill-defined nodule medial to the tip of the right hepatic lobe.  Stable small pulmonary nodules bilaterally consistent with benign findings.   2.  Family history: -Paternal uncle had colon cancer.  Maternal aunt had brain cancer and another maternal aunt had gynecological malignancy.  Father had lung cancer and was a smoker. - Genetic testing did not reveal any notable mutations.   3.   Diffuse erythema and nodularity of the dome of the bladder: -CT scan showed severely thickened bladder wall. -Cystoscopy on 04/08/2020 showed diffuse erythema, nodularity in the posterior wall tracking to the dome.  Ureteral orifices were in normal locations.  Biopsies were benign.   PLAN:  1.  [name] IV right colon adenocarcinoma, MSI-high: - She does not report any immunotherapy related side effects.  Last Beryle Flock was on 06/01/2021. - We reviewed her labs.  CEA was 1.3 on 05/11/2021. - We reviewed CT CAP from 05/05/2021 which showed further decrease in size of soft tissue medial to the inferior tip of the right liver with no new findings. - Labs from today shows potassium 3.3.  She has mildly elevated creatinine of 1.26 from CKD.  LFTs are normal.  CBC was grossly normal.  TSH today is 1.6. - She will proceed with her treatment with Keytruda today and in 3 weeks.  RTC 6 weeks for follow-up.  Plan to repeat CT scan CAP with contrast prior to next visit along with CEA level.  2.  Paroxysmal atrial fibrillation: - Continue Eliquis.  No bleeding issues. - She was supposed to have watchman procedure [facility] at [facility].   3.  Hypertension: - Continue Hyzaar and amlodipine.  4.  Hypokalemia: - Continue potassium 20 mEq daily.   Orders placed this encounter:  No orders of the defined types were placed in this encounter.  I provided 30 minutes of non-face-to-face time during this encounter.  Derek Jack, MD [place] [phone]   I, Thana Ates, am acting as a scribe for Dr. Derek Jack.  I, Derek Jack MD, have reviewed the above documentation for accuracy and completeness,  and I agree with the above.

## 2021-06-22 ENCOUNTER — Inpatient Hospital Stay (HOSPITAL_COMMUNITY): Payer: Medicare HMO | Attending: Hematology | Admitting: Hematology

## 2021-06-22 ENCOUNTER — Inpatient Hospital Stay (HOSPITAL_COMMUNITY): Payer: Medicare HMO

## 2021-06-22 ENCOUNTER — Encounter (HOSPITAL_COMMUNITY): Payer: Self-pay | Admitting: Hematology

## 2021-06-22 ENCOUNTER — Other Ambulatory Visit: Payer: Self-pay

## 2021-06-22 ENCOUNTER — Other Ambulatory Visit (HOSPITAL_COMMUNITY): Payer: Medicare HMO

## 2021-06-22 VITALS — BP 142/58 | HR 66 | Temp 98.1°F | Resp 18

## 2021-06-22 VITALS — BP 158/72 | HR 72 | Temp 98.5°F | Wt 164.8 lb

## 2021-06-22 DIAGNOSIS — E876 Hypokalemia: Secondary | ICD-10-CM | POA: Diagnosis not present

## 2021-06-22 DIAGNOSIS — Z79899 Other long term (current) drug therapy: Secondary | ICD-10-CM | POA: Insufficient documentation

## 2021-06-22 DIAGNOSIS — C189 Malignant neoplasm of colon, unspecified: Secondary | ICD-10-CM

## 2021-06-22 DIAGNOSIS — Z5112 Encounter for antineoplastic immunotherapy: Secondary | ICD-10-CM | POA: Insufficient documentation

## 2021-06-22 DIAGNOSIS — C182 Malignant neoplasm of ascending colon: Secondary | ICD-10-CM | POA: Insufficient documentation

## 2021-06-22 LAB — CBC WITH DIFFERENTIAL/PLATELET
Abs Immature Granulocytes: 0.02 10*3/uL (ref 0.00–0.07)
Basophils Absolute: 0 10*3/uL (ref 0.0–0.1)
Basophils Relative: 0 %
Eosinophils Absolute: 0.2 10*3/uL (ref 0.0–0.5)
Eosinophils Relative: 2 %
HCT: 39.2 % (ref 36.0–46.0)
Hemoglobin: 13.3 g/dL (ref 12.0–15.0)
Immature Granulocytes: 0 %
Lymphocytes Relative: 22 %
Lymphs Abs: 1.5 10*3/uL (ref 0.7–4.0)
MCH: 32 pg (ref 26.0–34.0)
MCHC: 33.9 g/dL (ref 30.0–36.0)
MCV: 94.5 fL (ref 80.0–100.0)
Monocytes Absolute: 0.7 10*3/uL (ref 0.1–1.0)
Monocytes Relative: 10 %
Neutro Abs: 4.6 10*3/uL (ref 1.7–7.7)
Neutrophils Relative %: 66 %
Platelets: 162 10*3/uL (ref 150–400)
RBC: 4.15 MIL/uL (ref 3.87–5.11)
RDW: 13 % (ref 11.5–15.5)
WBC: 7 10*3/uL (ref 4.0–10.5)
nRBC: 0 % (ref 0.0–0.2)

## 2021-06-22 LAB — COMPREHENSIVE METABOLIC PANEL
ALT: 17 U/L (ref 0–44)
AST: 20 U/L (ref 15–41)
Albumin: 4.3 g/dL (ref 3.5–5.0)
Alkaline Phosphatase: 65 U/L (ref 38–126)
Anion gap: 9 (ref 5–15)
BUN: 20 mg/dL (ref 8–23)
CO2: 31 mmol/L (ref 22–32)
Calcium: 9.8 mg/dL (ref 8.9–10.3)
Chloride: 95 mmol/L — ABNORMAL LOW (ref 98–111)
Creatinine, Ser: 1.26 mg/dL — ABNORMAL HIGH (ref 0.44–1.00)
GFR, Estimated: 45 mL/min — ABNORMAL LOW (ref >60)
Glucose, Bld: 102 mg/dL — ABNORMAL HIGH (ref 70–99)
Potassium: 3.3 mmol/L — ABNORMAL LOW (ref 3.5–5.1)
Sodium: 135 mmol/L (ref 135–145)
Total Bilirubin: 0.5 mg/dL (ref 0.3–1.2)
Total Protein: 7.8 g/dL (ref 6.5–8.1)

## 2021-06-22 LAB — TSH: TSH: 1.684 u[IU]/mL (ref 0.350–4.500)

## 2021-06-22 MED ORDER — SODIUM CHLORIDE 0.9 % IV SOLN
200.0000 mg | Freq: Once | INTRAVENOUS | Status: AC
  Administered 2021-06-22: 200 mg via INTRAVENOUS
  Filled 2021-06-22: qty 8

## 2021-06-22 MED ORDER — HEPARIN SOD (PORK) LOCK FLUSH 100 UNIT/ML IV SOLN
500.0000 [IU] | Freq: Once | INTRAVENOUS | Status: AC | PRN
  Administered 2021-06-22: 500 [IU]

## 2021-06-22 MED ORDER — SODIUM CHLORIDE 0.9 % IV SOLN: Freq: Once | INTRAVENOUS | Status: AC

## 2021-06-22 MED ORDER — SODIUM CHLORIDE 0.9% FLUSH
10.0000 mL | INTRAVENOUS | Status: DC | PRN
Start: 2021-06-22 — End: 2021-06-22
  Administered 2021-06-22: 10 mL

## 2021-06-22 NOTE — Progress Notes (Signed)
Patient has been assessed, vital signs and labs have been reviewed by Dr. Katragadda. ANC, Creatinine, LFTs, and Platelets are within treatment parameters per Dr. Katragadda. The patient is good to proceed with treatment at this time. Primary RN and pharmacy aware.  

## 2021-06-22 NOTE — Progress Notes (Signed)
Pt here today for Keytruda. Potassium 3.3 pt okay for treatment today per Dr. Esmond Harps given today per MD orders. Tolerated infusion without adverse affects. Vital signs stable. No complaints at this time. Discharged from clinic ambulatory in stable condition. Alert and oriented x 3. F/U with [facility] as scheduled.

## 2021-06-22 NOTE — Patient Instructions (Addendum)
[employer] [facility] Discharge Instructions  You were seen today by Dr. Delton Coombes. He went over your recent results and scans, and you received you treatment. You will be scheduled for a CT scan of your chest, abdomen, and pelvis prior to your next appointment. Dr. Delton Coombes will see you back in 6 weeks for labs and follow up.   Thank you for choosing [place] at [facility] to provide your oncology and hematology care.  To afford each patient quality time with our provider, please arrive at least 15 minutes before your scheduled appointment time.   If you have a lab appointment with the [facility] please come in thru the Main Entrance and check in at the main information desk  You need to re-schedule your appointment should you arrive 10 or more minutes late.  We strive to give you quality time with our providers, and arriving late affects you and other patients whose appointments are after yours.  Also, if you no show three or more times for appointments you may be dismissed from the clinic at the providers discretion.     Again, thank you for choosing [facility].  Our hope is that these requests will decrease the amount of time that you wait before being seen by our physicians.       _____________________________________________________________  Should you have questions after your visit to [facility], please contact our office at [phone] between the hours of 8:00 a.m. and 4:30 p.m.  Voicemails left after 4:00 p.m. will not be returned until the following business day.  For prescription refill requests, have your pharmacy contact our office and allow 72 hours.    Cancer Center Support Programs:   > Cancer Support Group  2nd Tuesday of the month 1pm-2pm, Journey Room

## 2021-06-22 NOTE — Patient Instructions (Signed)
[facility]  Discharge Instructions: Thank you for choosing [facility] to provide your oncology and hematology care.  If you have a lab appointment with the Cancer Center, please come in thru the Main Entrance and check in at the main information desk.  Wear comfortable clothing and clothing appropriate for easy access to any Portacath or PICC line.   We strive to give you quality time with your provider. You may need to reschedule your appointment if you arrive late (15 or more minutes).  Arriving late affects you and other patients whose appointments are after yours.  Also, if you miss three or more appointments without notifying the office, you may be dismissed from the clinic at the provider's discretion.      For prescription refill requests, have your pharmacy contact our office and allow 72 hours for refills to be completed.    Today you received the following chemotherapy and/or immunotherapy agents Keytruda.   To help prevent nausea and vomiting after your treatment, we encourage you to take your nausea medication as directed.  BELOW ARE SYMPTOMS THAT SHOULD BE REPORTED IMMEDIATELY: *FEVER GREATER THAN 100.4 F (38 C) OR HIGHER *CHILLS OR SWEATING *NAUSEA AND VOMITING THAT IS NOT CONTROLLED WITH YOUR NAUSEA MEDICATION *UNUSUAL SHORTNESS OF BREATH *UNUSUAL BRUISING OR BLEEDING *URINARY PROBLEMS (pain or burning when urinating, or frequent urination) *BOWEL PROBLEMS (unusual diarrhea, constipation, pain near the anus) TENDERNESS IN MOUTH AND THROAT WITH OR WITHOUT PRESENCE OF ULCERS (sore throat, sores in mouth, or a toothache) UNUSUAL RASH, SWELLING OR PAIN  UNUSUAL VAGINAL DISCHARGE OR ITCHING   Items with * indicate a potential emergency and should be followed up as soon as possible or go to the Emergency Department if any problems should occur.  Please show the CHEMOTHERAPY ALERT CARD or IMMUNOTHERAPY ALERT CARD at check-in to the Emergency  Department and triage nurse.  Should you have questions after your visit or need to cancel or reschedule your appointment, please contact [facility] [phone]  and follow the prompts.  Office hours are 8:00 a.m. to 4:30 p.m. Monday - Friday. Please note that voicemails left after 4:00 p.m. may not be returned until the following business day.  We are closed weekends and major holidays. You have access to a nurse at all times for urgent questions. Please call the main number to the clinic [phone] and follow the prompts.  For any non-urgent questions, you may also contact your provider using MyChart. We now offer e-Visits for anyone 18 and older to request care online for non-urgent symptoms. For details visit [email or web address].   Also download the MyChart app! Go to the app store, search "MyChart", open the app, select [facility], and log in with your MyChart username and password.  Due to Covid, a mask is required upon entering the hospital/clinic. If you do not have a mask, one will be given to you upon arrival. For doctor visits, patients may have 1 support person aged 18 or older with them. For treatment visits, patients cannot have anyone with them due to current Covid guidelines and our immunocompromised population.   Pembrolizumab injection What is this medication? PEMBROLIZUMAB (pem broe liz ue mab) is a monoclonal antibody. It is used totreat certain types of cancer. This medicine may be used for other purposes; ask your health care provider orpharmacist if you have questions. COMMON BRAND NAME(S): Keytruda What should I tell my care team before I take this medication? They need to   know if you have any of these conditions: autoimmune diseases like Crohn's disease, ulcerative colitis, or lupus have had or planning to have an allogeneic stem cell transplant (uses someone else's stem cells) history of organ transplant history of chest radiation nervous system  problems like myasthenia gravis or Guillain-Barre syndrome an unusual or allergic reaction to pembrolizumab, other medicines, foods, dyes, or preservatives pregnant or trying to get pregnant breast-feeding How should I use this medication? This medicine is for infusion into a vein. It is given by a health careprofessional in a hospital or clinic setting. A special MedGuide will be given to you before each treatment. Be sure to readthis information carefully each time. Talk to your pediatrician regarding the use of this medicine in children. While this drug may be prescribed for children as young as 6 months for selectedconditions, precautions do apply. Overdosage: If you think you have taken too much of this medicine contact apoison control center or emergency room at once. NOTE: This medicine is only for you. Do not share this medicine with others. What if I miss a dose? It is important not to miss your dose. Call your doctor or health careprofessional if you are unable to keep an appointment. What may interact with this medication? Interactions have not been studied. This list may not describe all possible interactions. Give your health care provider a list of all the medicines, herbs, non-prescription drugs, or dietary supplements you use. Also tell them if you smoke, drink alcohol, or use illegaldrugs. Some items may interact with your medicine. What should I watch for while using this medication? Your condition will be monitored carefully while you are receiving thismedicine. You may need blood work done while you are taking this medicine. Do not become pregnant while taking this medicine or for 4 months after stopping it. Women should inform their doctor if they wish to become pregnant or think they might be pregnant. There is a potential for serious side effects to an unborn child. Talk to your health care professional or pharmacist for more information. Do not breast-feed an infant while  taking this medicine orfor 4 months after the last dose. What side effects may I notice from receiving this medication? Side effects that you should report to your doctor or health care professionalas soon as possible: allergic reactions like skin rash, itching or hives, swelling of the face, lips, or tongue bloody or black, tarry breathing problems changes in vision chest pain chills confusion constipation cough diarrhea dizziness or feeling faint or lightheaded fast or irregular heartbeat fever flushing joint pain low blood counts - this medicine may decrease the number of white blood cells, red blood cells and platelets. You may be at increased risk for infections and bleeding. muscle pain muscle weakness pain, tingling, numbness in the hands or feet persistent headache redness, blistering, peeling or loosening of the skin, including inside the mouth signs and symptoms of high blood sugar such as dizziness; dry mouth; dry skin; fruity breath; nausea; stomach pain; increased hunger or thirst; increased urination signs and symptoms of kidney injury like trouble passing urine or change in the amount of urine signs and symptoms of liver injury like dark urine, light-colored stools, loss of appetite, nausea, right upper belly pain, yellowing of the eyes or skin sweating swollen lymph nodes weight loss Side effects that usually do not require medical attention (report to yourdoctor or health care professional if they continue or are bothersome): decreased appetite hair loss tiredness This list may not   describe all possible side effects. Call your doctor for medical advice about side effects. You may report side effects to FDA at1-800-FDA-1088. Where should I keep my medication? This drug is given in a hospital or clinic and will not be stored at home. NOTE: This sheet is a summary. It may not cover all possible information. If you have questions about this medicine, talk to your  doctor, pharmacist, orhealth care provider.  2022 Elsevier/Gold Standard (2019-10-23 21:44:53)   

## 2021-06-23 LAB — CEA: CEA: 1.7 ng/mL (ref 0.0–4.7)

## 2021-06-28 ENCOUNTER — Other Ambulatory Visit (INDEPENDENT_AMBULATORY_CARE_PROVIDER_SITE_OTHER): Payer: Self-pay

## 2021-06-28 MED ORDER — DORZOLAMIDE HCL-TIMOLOL MAL 2-0.5 % OP SOLN: 1.0000 [drp] | Freq: Two times a day (BID) | OPHTHALMIC | 3 refills | Status: DC

## 2021-07-06 NOTE — Progress Notes (Signed)
[facility] Note  07/08/2021     CHIEF COMPLAINT Patient presents for Retina Follow Up   HISTORY OF PRESENT ILLNESS: Gabrielle [name] is a 75 y.o. female who presents to the clinic today for:   HPI     Retina Follow Up   Patient presents with  Wet AMD.  In both eyes.  This started 5 weeks ago.  I, the attending physician,  performed the HPI with the patient and updated documentation appropriately.        Comments   Patient here for 5 weeks retina follow up for exu ARMD OU. Patient states vision may be a little bit worse. No eye pain.       Last edited by Bernarda Caffey, MD on 07/08/2021 10:37 AM.    pt states left eye vision "fogs up", she feels like it has decreased since last visit  Referring physician: Janora Norlander, DO [place],  [place] [id number]  HISTORICAL INFORMATION:   Selected notes from the [facility] Referred by Dr. Cristela Blue for concern of ARMD OU   CURRENT MEDICATIONS: Current Outpatient Medications (Ophthalmic Drugs)  Medication Sig   dorzolamide-timolol (COSOPT) 22.3-6.8 MG/ML ophthalmic solution PLACE 1 DROP INTO BOTH EYES 2 TIMES A DAY (Patient taking differently: Place 1 drop into both eyes 1 day or 1 dose.)   dorzolamide-timolol (COSOPT) 22.3-6.8 MG/ML ophthalmic solution Place 1 drop into both eyes 2 (two) times daily.   No current facility-administered medications for this visit. (Ophthalmic Drugs)   Current Outpatient Medications (Other)  Medication Sig   alendronate (FOSAMAX) 70 MG tablet Take 1 tablet (70 mg total) by mouth every 7 (seven) days. Take with a full glass of water on an empty stomach.   amLODipine (NORVASC) 5 MG tablet Take 1 tablet (5 mg total) by mouth daily.   atorvastatin (LIPITOR) 20 MG tablet Take 1 tablet (20 mg total) by mouth daily.   cholecalciferol (VITAMIN D3) 25 MCG (1000 UT) tablet Take 1,000 Units by mouth daily.   CRANBERRY FRUIT PO Take 1 tablet by mouth daily.    diclofenac Sodium (VOLTAREN) 1 % GEL APPLY 4 GRAMS TO AFFECTED AREA(S) 4 TIMES A DAY   ELIQUIS 5 MG TABS tablet Take 5 mg by mouth 2 (two) times daily.   FEROSUL 325 (65 Fe) MG tablet TAKE  (1)  TABLET TWICE A DAY WITH MEALS (BREAKFAST AND SUPPER)   Garlic 314 MG TABS Take by mouth.   lidocaine-prilocaine (EMLA) cream Apply a small amount to port a cath site and cover with plastic wrap 1 hour prior to chemotherapy appointments   losartan-hydrochlorothiazide (HYZAAR) 100-25 MG tablet Take 1 tablet by mouth daily.   potassium chloride SA (KLOR-CON) 20 MEQ tablet Take 1 tablet (20 mEq total) by mouth daily.   prochlorperazine (COMPAZINE) 10 MG tablet Take 1 tablet (10 mg total) by mouth every 6 (six) hours as needed for nausea or vomiting.   No current facility-administered medications for this visit. (Other)      REVIEW OF SYSTEMS: ROS   Positive for: Gastrointestinal, Genitourinary, Musculoskeletal, Cardiovascular, Eyes Negative for: Constitutional, Neurological, Skin, HENT, Endocrine, Respiratory, Psychiatric, Allergic/Imm, Heme/Lymph Last edited by Theodore Demark, COA on 07/08/2021  8:01 AM.         ALLERGIES Allergies  Allergen Reactions   Lisinopril Cough    PAST MEDICAL HISTORY Past Medical History:  Diagnosis Date   Arthritis    Cancer ([place])  Cataract    OU   Colon cancer [facility])    Family history of pancreatic cancer 08/21/2020   Family history of uterine cancer 08/21/2020   H/O cesarean section    Hx of tonsillectomy    Hyperlipidemia    Hypertension    Macular degeneration    Exu ARMD OU   Paroxysmal atrial fibrillation ([place]) 07/05/2020   Past Surgical History:  Procedure Laterality Date   BIOPSY  04/06/2020   Procedure: BIOPSY;  Surgeon: Rogene Houston, MD;  Location: [facility];  Service: Endoscopy;;   CARPAL TUNNEL RELEASE Right    CATARACT EXTRACTION W/PHACO Left 10/11/2019   Procedure: CATARACT EXTRACTION PHACO AND INTRAOCULAR LENS PLACEMENT ([place]);   Surgeon: Baruch Goldmann, MD;  Location: [facility] ORS;  Service: Ophthalmology;  Laterality: Left;  CDE: 8.56   CATARACT EXTRACTION W/PHACO Right 10/25/2019   Procedure: CATARACT EXTRACTION PHACO AND INTRAOCULAR LENS PLACEMENT (I[facility]);  Surgeon: Baruch Goldmann, MD;  Location: [facility] ORS;  Service: Ophthalmology;  Laterality: Right;  CDE: 5.67   CESAREAN SECTION     COLONOSCOPY N/A 04/06/2020   Procedure: COLONOSCOPY;  Surgeon: Rogene Houston, MD;  Location: [facility];  Service: Endoscopy;  Laterality: N/A;   CYSTOSCOPY WITH BIOPSY N/A 04/08/2020   Procedure: CYSTOSCOPY WITH BIOPSY;  Surgeon: Cleon Gustin, MD;  Location: [facility] ORS;  Service: Urology;  Laterality: N/A;   ESOPHAGOGASTRODUODENOSCOPY N/A 04/05/2020   Procedure: ESOPHAGOGASTRODUODENOSCOPY (EGD);  Surgeon: Rogene Houston, MD;  Location: [facility];  Service: Endoscopy;  Laterality: N/A;   PARTIAL COLECTOMY N/A 04/08/2020   Procedure: PARTIAL COLECTOMY;  Surgeon: Virl Cagey, MD;  Location: [facility] ORS;  Service: General;  Laterality: N/A;   PORTACATH PLACEMENT Left 05/18/2020   Procedure: INSERTION PORT-A-CATH (ATTACHED CATHETER IN LEFT SUBCLAVIAN);  Surgeon: Virl Cagey, MD;  Location: [facility] OR[facility];  Service: General;  Laterality: Left;   TONSILLECTOMY      FAMILY HISTORY Family History  Problem Relation Age of Onset   Macular degeneration Mother    Stroke Mother    Hypertension Mother    Dementia Mother    Lung cancer Father        dx late 36s; smoking hx   Diabetes Sister    Hypertension Sister    Leukemia Paternal Uncle        d. 16s   Cancer Maternal Aunt        ovarian or endometrial dx 66s   Pancreatic cancer Paternal Uncle        d. late 91s    SOCIAL HISTORY Social History   Tobacco Use   Smoking status: Never   Smokeless tobacco: Never  Vaping Use   Vaping Use: Never used  Substance Use Topics   Alcohol use: No   Drug use: No         OPHTHALMIC EXAM:  Base Eye Exam     Visual Acuity (Snellen -  Linear)       Right Left   Dist cc CF at 3' 20/150   Dist ph cc NI NI    Correction: Glasses         Tonometry (Tonopen, 7:59 AM)       Right Left   Pressure 20 19         Pupils       Dark Light Shape React APD   Right 4 3 Round Brisk None   Left 4 3 Round Brisk None  Visual Fields (Counting fingers)       Left Right    Full Full         Extraocular Movement       Right Left    Full Full         Neuro/Psych     Oriented x3: Yes   Mood/Affect: Normal         Dilation     Both eyes: 1.0% Mydriacyl, 2.5% Phenylephrine @ 7:58 AM           Slit Lamp and Fundus Exam     Slit Lamp Exam       Right Left   Lids/Lashes Dermatochalasis - upper lid Dermatochalasis - upper lid   Conjunctiva/Sclera 1+ Injection White and quiet   Cornea Arcus, 3+ fine Punctpate epithelial erosions Arcus, 3+ fine Punctate epithelial erosions, mild tear film debris   Anterior Chamber Deep and quiet Deep and quiet   Iris Round and well dilated Round and well dilated   Lens PCIOL in good position, 1+ Posterior capsular opacification PC IOL in good position with open PC   Vitreous Vitreous syneresis, Posterior vitreous detachment, vitreous condensations Vitreous syneresis, Posterior vitreous detachment, silicone oil micro drops         Fundus Exam       Right Left   Disc pink and sharp, compact pink and sharp, Compact, vascular loops superiorly   C/D Ratio 0.2 0.2   Macula Blunted foveal reflex, Drusen, Pigment clumping and atrophy, central thickening/pigmented disciform scar, +PED, no heme, persistent, trace cystic changes overlying central scar  Blunted foveal reflex, central thickening, RPE clumping and atrophy, Drusen, RPE rip, trace cystic changes overlying PED -- stably improved, no heme   Vessels Vascular attenuation, Tortuous Vascular attenuation, Tortuous   Periphery Attached, mild Reticular degeneration Attached, mild Reticular degeneration            Refraction     Wearing Rx       Sphere Cylinder Add   Right [place] Sphere +3.50   Left -0.50 Sphere +3.50    Type: PAL            IMAGING AND PROCEDURES  Imaging and Procedures for 03/21/18  OCT, Retina - OU - Both Eyes       Right Eye Quality was good. Central Foveal Thickness: 523. Progression has been stable. Findings include pigment epithelial detachment, epiretinal membrane, subretinal hyper-reflective material, retinal drusen , abnormal foveal contour, no SRF, intraretinal fluid, disciform scar, outer retinal tubulation, outer retinal atrophy (Stable sub-retinal scar, mild persistent cystic changes overlying).   Left Eye Quality was good. Central Foveal Thickness: 321. Progression has been stable. Findings include subretinal hyper-reflective material, pigment epithelial detachment, intraretinal fluid, retinal drusen , outer retinal atrophy, normal foveal contour, no SRF (Trace cystic changes / IRF -- slightly improved, stable ORA/disciform scar).   Notes *Images captured and stored on drive  Diagnosis / Impression:  Exudative ARMD OU OD: Stable sub-retinal scar, mild persistent cystic changes overlying OS: Trace cystic changes / IRF -- slightly improved, stable ORA/disciform scar  Clinical management:  See below  Abbreviations: NFP - Normal foveal profile. CME - cystoid macular edema. PED - pigment epithelial detachment. IRF - intraretinal fluid. SRF - subretinal fluid. EZ - ellipsoid zone. ERM - epiretinal membrane. ORA - outer retinal atrophy. ORT - outer retinal tubulation. SRHM - subretinal hyper-reflective material      Intravitreal Injection, Pharmacologic Agent - OS - Left Eye  Time Out 07/08/2021. 8:37 AM. Confirmed correct patient, procedure, site, and patient consented.   Anesthesia Topical anesthesia was used. Anesthetic medications included Lidocaine 2%, Proparacaine 0.5%.   Procedure Preparation included 5% betadine to ocular  surface, eyelid speculum. A (33g) needle was used.   Injection: 2 mg aflibercept 2 MG/0.05ML   Route: Intravitreal, Site: Left Eye   NDC: [id number], Lot: [id number], Expiration date: 04/04/2022, Waste: 0.05 mL   Post-op Post injection exam found visual acuity of at least counting fingers. The patient tolerated the procedure well. There were no complications. The patient received written and verbal post procedure care education. Post injection medications were not given.            ASSESSMENT/PLAN:    ICD-10-CM   1. Exudative age-related macular degeneration of both eyes with active choroidal neovascularization (HCC)  H35.3231 Intravitreal Injection, Pharmacologic Agent - OS - Left Eye    aflibercept (EYLEA) SOLN 2 mg    2. Retinal edema  H35.81 OCT, Retina - OU - Both Eyes    3. Posterior vitreous detachment of both eyes  H43.813     4. Pseudophakia of both eyes  Z96.1     5. Ocular hypertension, bilateral  H40.053     6. Dry eyes  H04.123      1,2. Exudative age related macular degeneration, both eyes.    - severe exudative disease with very active CNVM OU at presentation in January 2019  - S/P IVA OD #1 (01.04.19), #2 (02.15.19)  - S/P IVA OS #1 (01.18.19), #2 (02.15.19)  - switched to Eylea 3.18.19 due to severity of disease  - S/P IVE OD #1 (03.18.19), #2 (04.17.19), #3 (05.15.19), #4 (10.02.19),  #5 (01.15.20), #6 (06.04.20), #7 (10.29.20), #8 (01.21.21), #9 (05.27.21), #10 (07.22.21) -- injections held due to stable disciform scar  - S/P IVE OS #1 (03.18.19), #2 (04.17.19), #3 (05.15.19), #4 (06.12.19), #5 (07.10.19), #6 (08.07.19), #7 (09.04.19), #8 (10.02.19), #9 (11.06.19), #10 (12.11.19), #11 (01.15.20), #12 (02.20.20), #13 (03.26.20), #14 (04.30.20), #15 (06.04.20), #16 (07.16.20), # 17 (08.20.20), #18 (09.24.20), #19 (10.29.20), #20 (12.03.20), #21 (01.21.21), #22 (03.18.21), #23 (05.27.21), #24 (07.22.21), #25 (11.11.21), #26 (12.09.21), #27 (01.06.22), #28  (2.7.22), #29 (03.24.22), #30 (4.28.22), #31 (05.31.22), #32 (06.30.22)  - IVE OS held from 7.22.21 to 11.11.21 due to TIA/stroke on 8.1.21  - OCT OS: Trace cystic changes / IRF -- slightly improved, stable ORA/disciform scar at 5 wks  - exam OS shows macular heme stably resolved  - OD: Stable sub-retinal scar, mild persistent cystic changes overlying  - BCVA OD stable at CF 3'; OS decreased to 20/150 from 20/100 mostly due to dry eyes  - recommend IVE OS #33 today, 08.04.22 -- maintenance w/ 5 wk f/u - will cont to hold OD  - Eylea informed consent form signed and scanned on 01.21.2021  - Eylea4U paperwork filled out on 02.15.19 and fully approved through [employer] -- approved  - f/u in 6 weeks, sooner prn for DFE/OCT/possible injection(s)  3. PVD / vitreous syneresis  - Discussed findings and prognosis   - No RT or RD on 360 exam  - Reviewed s/s of RT/RD  - Strict return precautions for any such RT/RD signs/symptoms  4. Pseudophakia OU  - s/p CE/IOL (Dr. Marisa Hua, 11.2020)  - beautiful surgeries w/ IOLs in excellent position, doing well  - monitor   5. Ocular Hypertension OU  - IOP 20,19  - pt has not increased Cosopt to BID -- still using qd  -  inc Cosopt to BID OU  6. Dry eyes OU - recommend artificial tears and lubricating ointment as needed  Ophthalmic Meds Ordered this visit:  Meds ordered this encounter  Medications   aflibercept (EYLEA) SOLN 2 mg      Return in about 6 weeks (around 08/19/2021) for f/u exu ARMD OU, DFE, OCT.  There are no Patient Instructions on file for this visit.  This document serves as a record of services personally performed by Gardiner Sleeper, MD, PhD. It was created on their behalf by Leonie Douglas, an ophthalmic technician. The creation of this record is the provider's dictation and/or activities during the visit.    Electronically signed by: Leonie Douglas COA, 07/08/21  10:40 AM  This document serves as a record of services personally  performed by Gardiner Sleeper, MD, PhD. It was created on their behalf by San Jetty. Owens Shark, OA an ophthalmic technician. The creation of this record is the provider's dictation and/or activities during the visit.    Electronically signed by: San Jetty. Owens Shark, [place] 08.04.2022 10:40 AM  Gardiner Sleeper, M.D., Ph.D. Diseases & Surgery of the Retina and [place] 07/08/2021   I have reviewed the above documentation for accuracy and completeness, and I agree with the above. Gardiner Sleeper, M.D., Ph.D. 07/08/21 10:40 AM   Abbreviations: M myopia (nearsighted); A astigmatism; H hyperopia (farsighted); P presbyopia; Mrx spectacle prescription;  CTL contact lenses; OD right eye; OS left eye; OU both eyes  XT exotropia; ET esotropia; PEK punctate epithelial keratitis; PEE punctate epithelial erosions; DES dry eye syndrome; MGD meibomian gland dysfunction; ATs artificial tears; PFAT's preservative free artificial tears; [place] nuclear sclerotic cataract; PSC posterior subcapsular cataract; ERM epi-retinal membrane; PVD posterior vitreous detachment; RD retinal detachment; DM diabetes mellitus; DR diabetic retinopathy; NPDR non-proliferative diabetic retinopathy; PDR proliferative diabetic retinopathy; CSME clinically significant macular edema; DME diabetic macular edema; dbh dot blot hemorrhages; CWS cotton wool spot; POAG primary open angle glaucoma; C/D cup-to-disc ratio; HVF humphrey visual field; GVF goldmann visual field; OCT optical coherence tomography; IOP intraocular pressure; BRVO Branch retinal vein occlusion; CRVO central retinal vein occlusion; CRAO central retinal artery occlusion; BRAO branch retinal artery occlusion; RT retinal tear; SB scleral buckle; PPV pars plana vitrectomy; VH Vitreous hemorrhage; PRP panretinal laser photocoagulation; IVK intravitreal kenalog; VMT vitreomacular traction; MH Macular hole;  NVD neovascularization of the disc; NVE neovascularization  elsewhere; AREDS age related eye disease study; ARMD age related macular degeneration; POAG primary open angle glaucoma; EBMD epithelial/anterior basement membrane dystrophy; ACIOL anterior chamber intraocular lens; IOL intraocular lens; PCIOL posterior chamber intraocular lens; Phaco/IOL phacoemulsification with intraocular lens placement; [place] photorefractive keratectomy; LASIK laser assisted in situ keratomileusis; HTN hypertension; DM diabetes mellitus; COPD chronic obstructive pulmonary disease

## 2021-07-08 ENCOUNTER — Ambulatory Visit (INDEPENDENT_AMBULATORY_CARE_PROVIDER_SITE_OTHER): Payer: Medicare HMO | Admitting: Ophthalmology

## 2021-07-08 ENCOUNTER — Other Ambulatory Visit: Payer: Self-pay

## 2021-07-08 ENCOUNTER — Encounter (INDEPENDENT_AMBULATORY_CARE_PROVIDER_SITE_OTHER): Payer: Self-pay | Admitting: Ophthalmology

## 2021-07-08 DIAGNOSIS — H43813 Vitreous degeneration, bilateral: Secondary | ICD-10-CM

## 2021-07-08 DIAGNOSIS — Z961 Presence of intraocular lens: Secondary | ICD-10-CM | POA: Diagnosis not present

## 2021-07-08 DIAGNOSIS — H3581 Retinal edema: Secondary | ICD-10-CM

## 2021-07-08 DIAGNOSIS — H04123 Dry eye syndrome of bilateral lacrimal glands: Secondary | ICD-10-CM | POA: Diagnosis not present

## 2021-07-08 DIAGNOSIS — H40053 Ocular hypertension, bilateral: Secondary | ICD-10-CM

## 2021-07-08 DIAGNOSIS — H353231 Exudative age-related macular degeneration, bilateral, with active choroidal neovascularization: Secondary | ICD-10-CM

## 2021-07-08 MED ORDER — AFLIBERCEPT 2MG/0.05ML IZ SOLN FOR KALEIDOSCOPE
2.0000 mg | INTRAVITREAL | Status: AC | PRN
  Administered 2021-07-08: 2 mg via INTRAVITREAL

## 2021-07-09 ENCOUNTER — Ambulatory Visit (HOSPITAL_COMMUNITY): Payer: Medicare HMO

## 2021-07-13 ENCOUNTER — Inpatient Hospital Stay (HOSPITAL_COMMUNITY): Payer: Medicare HMO

## 2021-07-13 ENCOUNTER — Inpatient Hospital Stay (HOSPITAL_COMMUNITY): Payer: Medicare HMO | Attending: Hematology

## 2021-07-13 ENCOUNTER — Ambulatory Visit (HOSPITAL_COMMUNITY): Payer: Medicare HMO | Admitting: Hematology

## 2021-07-13 ENCOUNTER — Other Ambulatory Visit: Payer: Self-pay

## 2021-07-13 VITALS — BP 159/66 | HR 60 | Temp 97.3°F | Resp 18 | Wt 167.6 lb

## 2021-07-13 DIAGNOSIS — C189 Malignant neoplasm of colon, unspecified: Secondary | ICD-10-CM

## 2021-07-13 DIAGNOSIS — Z5112 Encounter for antineoplastic immunotherapy: Secondary | ICD-10-CM | POA: Diagnosis not present

## 2021-07-13 DIAGNOSIS — C182 Malignant neoplasm of ascending colon: Secondary | ICD-10-CM | POA: Insufficient documentation

## 2021-07-13 DIAGNOSIS — C799 Secondary malignant neoplasm of unspecified site: Secondary | ICD-10-CM | POA: Insufficient documentation

## 2021-07-13 DIAGNOSIS — Z79899 Other long term (current) drug therapy: Secondary | ICD-10-CM | POA: Insufficient documentation

## 2021-07-13 LAB — CBC WITH DIFFERENTIAL/PLATELET
Abs Immature Granulocytes: 0.02 10*3/uL (ref 0.00–0.07)
Basophils Absolute: 0 10*3/uL (ref 0.0–0.1)
Basophils Relative: 1 %
Eosinophils Absolute: 0.3 10*3/uL (ref 0.0–0.5)
Eosinophils Relative: 4 %
HCT: 37.2 % (ref 36.0–46.0)
Hemoglobin: 12.7 g/dL (ref 12.0–15.0)
Immature Granulocytes: 0 %
Lymphocytes Relative: 27 %
Lymphs Abs: 1.8 10*3/uL (ref 0.7–4.0)
MCH: 32.2 pg (ref 26.0–34.0)
MCHC: 34.1 g/dL (ref 30.0–36.0)
MCV: 94.4 fL (ref 80.0–100.0)
Monocytes Absolute: 0.7 10*3/uL (ref 0.1–1.0)
Monocytes Relative: 10 %
Neutro Abs: 3.9 10*3/uL (ref 1.7–7.7)
Neutrophils Relative %: 58 %
Platelets: 143 10*3/uL — ABNORMAL LOW (ref 150–400)
RBC: 3.94 MIL/uL (ref 3.87–5.11)
RDW: 13.2 % (ref 11.5–15.5)
WBC: 6.7 10*3/uL (ref 4.0–10.5)
nRBC: 0 % (ref 0.0–0.2)

## 2021-07-13 LAB — COMPREHENSIVE METABOLIC PANEL
ALT: 17 U/L (ref 0–44)
AST: 22 U/L (ref 15–41)
Albumin: 4.2 g/dL (ref 3.5–5.0)
Alkaline Phosphatase: 72 U/L (ref 38–126)
Anion gap: 8 (ref 5–15)
BUN: 20 mg/dL (ref 8–23)
CO2: 30 mmol/L (ref 22–32)
Calcium: 9.7 mg/dL (ref 8.9–10.3)
Chloride: 98 mmol/L (ref 98–111)
Creatinine, Ser: 1.09 mg/dL — ABNORMAL HIGH (ref 0.44–1.00)
GFR, Estimated: 53 mL/min — ABNORMAL LOW (ref >60)
Glucose, Bld: 87 mg/dL (ref 70–99)
Potassium: 3.1 mmol/L — ABNORMAL LOW (ref 3.5–5.1)
Sodium: 136 mmol/L (ref 135–145)
Total Bilirubin: 0.7 mg/dL (ref 0.3–1.2)
Total Protein: 7.7 g/dL (ref 6.5–8.1)

## 2021-07-13 LAB — TSH: TSH: 1.63 u[IU]/mL (ref 0.350–4.500)

## 2021-07-13 MED ORDER — SODIUM CHLORIDE 0.9% FLUSH
10.0000 mL | INTRAVENOUS | Status: DC | PRN
  Administered 2021-07-13: 10 mL

## 2021-07-13 MED ORDER — HEPARIN SOD (PORK) LOCK FLUSH 100 UNIT/ML IV SOLN
500.0000 [IU] | Freq: Once | INTRAVENOUS | Status: AC | PRN
  Administered 2021-07-13: 500 [IU]

## 2021-07-13 MED ORDER — POTASSIUM CHLORIDE CRYS ER 20 MEQ PO TBCR
40.0000 meq | EXTENDED_RELEASE_TABLET | Freq: Once | ORAL | Status: AC
  Administered 2021-07-13: 40 meq via ORAL

## 2021-07-13 MED ORDER — POTASSIUM CHLORIDE CRYS ER 20 MEQ PO TBCR
EXTENDED_RELEASE_TABLET | ORAL | Status: AC
  Filled 2021-07-13: qty 2

## 2021-07-13 MED ORDER — SODIUM CHLORIDE 0.9 % IV SOLN: Freq: Once | INTRAVENOUS | Status: AC

## 2021-07-13 MED ORDER — SODIUM CHLORIDE 0.9 % IV SOLN
200.0000 mg | Freq: Once | INTRAVENOUS | Status: AC
  Administered 2021-07-13: 200 mg via INTRAVENOUS
  Filled 2021-07-13: qty 8

## 2021-07-13 NOTE — Progress Notes (Signed)
Pt here today for treatment. Vital signs stable and pt voiced no new complaints at this time. Labs within normal parameters for treatment. Potassium 3.1 okay for treatment per Dr. Delton Coombes.Message received from Dr. [name] to give 78mEq of potassium chloride p.o.x 1.   Treatment given today per MD orders. Tolerated infusion without adverse affects. Vital signs stable. No complaints at this time. Discharged from clinic ambulatory in stable condition. Alert and oriented x 3. F/U with [facility] as scheduled.

## 2021-07-13 NOTE — Patient Instructions (Signed)
[facility]  Discharge Instructions: Thank you for choosing [facility] to provide your oncology and hematology care.  If you have a lab appointment with the Cancer Center, please come in thru the Main Entrance and check in at the main information desk.  Wear comfortable clothing and clothing appropriate for easy access to any Portacath or PICC line.   We strive to give you quality time with your provider. You may need to reschedule your appointment if you arrive late (15 or more minutes).  Arriving late affects you and other patients whose appointments are after yours.  Also, if you miss three or more appointments without notifying the office, you may be dismissed from the clinic at the provider's discretion.      For prescription refill requests, have your pharmacy contact our office and allow 72 hours for refills to be completed.    Today you received the following chemotherapy and/or immunotherapy agents Keytruda       To help prevent nausea and vomiting after your treatment, we encourage you to take your nausea medication as directed.  BELOW ARE SYMPTOMS THAT SHOULD BE REPORTED IMMEDIATELY: *FEVER GREATER THAN 100.4 F (38 C) OR HIGHER *CHILLS OR SWEATING *NAUSEA AND VOMITING THAT IS NOT CONTROLLED WITH YOUR NAUSEA MEDICATION *UNUSUAL SHORTNESS OF BREATH *UNUSUAL BRUISING OR BLEEDING *URINARY PROBLEMS (pain or burning when urinating, or frequent urination) *BOWEL PROBLEMS (unusual diarrhea, constipation, pain near the anus) TENDERNESS IN MOUTH AND THROAT WITH OR WITHOUT PRESENCE OF ULCERS (sore throat, sores in mouth, or a toothache) UNUSUAL RASH, SWELLING OR PAIN  UNUSUAL VAGINAL DISCHARGE OR ITCHING   Items with * indicate a potential emergency and should be followed up as soon as possible or go to the Emergency Department if any problems should occur.  Please show the CHEMOTHERAPY ALERT CARD or IMMUNOTHERAPY ALERT CARD at check-in to the Emergency  Department and triage nurse.  Should you have questions after your visit or need to cancel or reschedule your appointment, please contact [facility] [phone]  and follow the prompts.  Office hours are 8:00 a.m. to 4:30 p.m. Monday - Friday. Please note that voicemails left after 4:00 p.m. may not be returned until the following business day.  We are closed weekends and major holidays. You have access to a nurse at all times for urgent questions. Please call the main number to the clinic [phone] and follow the prompts.  For any non-urgent questions, you may also contact your provider using MyChart. We now offer e-Visits for anyone 18 and older to request care online for non-urgent symptoms. For details visit [email or web address].   Also download the MyChart app! Go to the app store, search "MyChart", open the app, select [facility], and log in with your MyChart username and password.  Due to Covid, a mask is required upon entering the hospital/clinic. If you do not have a mask, one will be given to you upon arrival. For doctor visits, patients may have 1 support person aged 18 or older with them. For treatment visits, patients cannot have anyone with them due to current Covid guidelines and our immunocompromised population.  

## 2021-07-22 ENCOUNTER — Encounter: Payer: Self-pay | Admitting: Family Medicine

## 2021-07-22 ENCOUNTER — Other Ambulatory Visit: Payer: Self-pay

## 2021-07-22 ENCOUNTER — Ambulatory Visit (INDEPENDENT_AMBULATORY_CARE_PROVIDER_SITE_OTHER): Payer: Medicare HMO

## 2021-07-22 ENCOUNTER — Ambulatory Visit (INDEPENDENT_AMBULATORY_CARE_PROVIDER_SITE_OTHER): Payer: Medicare HMO | Admitting: Family Medicine

## 2021-07-22 VITALS — BP 173/72 | HR 64 | Temp 97.9°F | Ht 62.0 in | Wt 165.2 lb

## 2021-07-22 DIAGNOSIS — M8588 Other specified disorders of bone density and structure, other site: Secondary | ICD-10-CM | POA: Diagnosis not present

## 2021-07-22 DIAGNOSIS — C182 Malignant neoplasm of ascending colon: Secondary | ICD-10-CM | POA: Diagnosis not present

## 2021-07-22 DIAGNOSIS — D631 Anemia in chronic kidney disease: Secondary | ICD-10-CM

## 2021-07-22 DIAGNOSIS — N1831 Chronic kidney disease, stage 3a: Secondary | ICD-10-CM

## 2021-07-22 DIAGNOSIS — E782 Mixed hyperlipidemia: Secondary | ICD-10-CM

## 2021-07-22 DIAGNOSIS — I480 Paroxysmal atrial fibrillation: Secondary | ICD-10-CM | POA: Diagnosis not present

## 2021-07-22 DIAGNOSIS — I10 Essential (primary) hypertension: Secondary | ICD-10-CM

## 2021-07-22 DIAGNOSIS — M8580 Other specified disorders of bone density and structure, unspecified site: Secondary | ICD-10-CM | POA: Diagnosis not present

## 2021-07-22 DIAGNOSIS — M8589 Other specified disorders of bone density and structure, multiple sites: Secondary | ICD-10-CM | POA: Diagnosis not present

## 2021-07-22 DIAGNOSIS — Z780 Asymptomatic menopausal state: Secondary | ICD-10-CM | POA: Diagnosis not present

## 2021-07-22 MED ORDER — ALENDRONATE SODIUM 70 MG PO TABS: 70.0000 mg | ORAL_TABLET | ORAL | 3 refills | Status: DC

## 2021-07-22 MED ORDER — AMLODIPINE BESYLATE 5 MG PO TABS: 5.0000 mg | ORAL_TABLET | Freq: Every day | ORAL | 3 refills | Status: DC

## 2021-07-22 MED ORDER — ATORVASTATIN CALCIUM 20 MG PO TABS: 20.0000 mg | ORAL_TABLET | Freq: Every day | ORAL | 3 refills | Status: DC

## 2021-07-22 NOTE — Progress Notes (Signed)
Subjective: CC: Hypertension, hyperlipidemia, osteopenia PCP: Janora Norlander, DO MWU:[id number] K Gabrielle [name] is a 75 y.o. female presenting to clinic today for:  1.  Hypertension with hyperlipidemia and CKD 3; history of atrial fibrillation Patient is compliant with her Norvasc, Hyzaar, potassium and Lipitor.  No chest pain, shortness of breath, dizziness.  She has chronic vision problems.  She is no longer requiring Eliquis.  Reports good urine output.  2.  Colon cancer Patient is closely followed by oncology for colon cancer.  She has a CT coming next week.  She has been tolerating the Keytruda without any difficulty.  Denies any nausea, vomiting or hair loss  3.  Osteopenia with high fracture risk Patient is compliant with Fosamax.  She has been on this treatment since 2019.  Last DEXA scan was 2019 and T score was -2.4 at that time.   ROS: Per HPI  Allergies  Allergen Reactions   Lisinopril Cough   Past Medical History:  Diagnosis Date   Arthritis    Cancer [facility])    Cataract    OU   Colon cancer ([place])    Family history of pancreatic cancer 08/21/2020   Family history of uterine cancer 08/21/2020   H/O cesarean section    Hx of tonsillectomy    Hyperlipidemia    Hypertension    Macular degeneration    Exu [facility] OU   Paroxysmal atrial fibrillation ([place]) 07/05/2020    Current Outpatient Medications:    alendronate (FOSAMAX) 70 MG tablet, Take 1 tablet (70 mg total) by mouth every 7 (seven) days. Take with a full glass of water on an empty stomach., Disp: 4 tablet, Rfl: 11   amLODipine (NORVASC) 5 MG tablet, Take 1 tablet (5 mg total) by mouth daily., Disp: 90 tablet, Rfl: 0   atorvastatin (LIPITOR) 20 MG tablet, Take 1 tablet (20 mg total) by mouth daily., Disp: 90 tablet, Rfl: 0   cholecalciferol (VITAMIN D3) 25 MCG (1000 UT) tablet, Take 1,000 Units by mouth daily., Disp: , Rfl:    CRANBERRY FRUIT PO, Take 1 tablet by mouth daily., Disp: , Rfl:    diclofenac Sodium  (VOLTAREN) 1 % GEL, APPLY 4 GRAMS TO AFFECTED AREA(S) 4 TIMES A DAY, Disp: 400 g, Rfl: 2   dorzolamide-timolol (COSOPT) 22.3-6.8 MG/ML ophthalmic solution, PLACE 1 DROP INTO BOTH EYES 2 TIMES A DAY (Patient taking differently: Place 1 drop into both eyes 1 day or 1 dose.), Disp: 10 mL, Rfl: 0   dorzolamide-timolol (COSOPT) 22.3-6.8 MG/ML ophthalmic solution, Place 1 drop into both eyes 2 (two) times daily., Disp: 10 mL, Rfl: 3   ELIQUIS 5 MG TABS tablet, Take 5 mg by mouth 2 (two) times daily., Disp: , Rfl:    FEROSUL 325 (65 Fe) MG tablet, TAKE  (1)  TABLET TWICE A DAY WITH MEALS (BREAKFAST AND SUPPER), Disp: 180 tablet, Rfl: 0   Garlic 102 MG TABS, Take by mouth., Disp: , Rfl:    lidocaine-prilocaine (EMLA) cream, Apply a small amount to port a cath site and cover with plastic wrap 1 hour prior to chemotherapy appointments, Disp: 30 g, Rfl: 0   losartan-hydrochlorothiazide (HYZAAR) 100-25 MG tablet, Take 1 tablet by mouth daily., Disp: 90 tablet, Rfl: 3   potassium chloride SA (KLOR-CON) 20 MEQ tablet, Take 1 tablet (20 mEq total) by mouth daily., Disp: 90 tablet, Rfl: 0   prochlorperazine (COMPAZINE) 10 MG tablet, Take 1 tablet (10 mg total) by mouth every 6 (six) hours as needed  for nausea or vomiting., Disp: 30 tablet, Rfl: 0 Social History   Socioeconomic History   Marital status: Divorced    Spouse name: Not on file   Number of children: Not on file   Years of education: Not on file   Highest education level: Not on file  Occupational History   Not on file  Tobacco Use   Smoking status: Never   Smokeless tobacco: Never  Vaping Use   Vaping Use: Never used  Substance and Sexual Activity   Alcohol use: No   Drug use: No   Sexual activity: Not Currently  Other Topics Concern   Not on file  Social History Narrative   Not on file   Social Determinants of Health   Financial Resource Strain: Not on file  Food Insecurity: Not on file  Transportation Needs: Not on file  Physical  Activity: Not on file  Stress: Not on file  Social Connections: Not on file  Intimate Partner Violence: Not on file   Family History  Problem Relation Age of Onset   Macular degeneration Mother    Stroke Mother    Hypertension Mother    Dementia Mother    Lung cancer Father        dx late 84s; smoking hx   Diabetes Sister    Hypertension Sister    Leukemia Paternal Uncle        d. 56s   Cancer Maternal Aunt        ovarian or endometrial dx 62s   Pancreatic cancer Paternal Uncle        d. late 76s    Objective: Office vital signs reviewed. BP (!) 173/72   Pulse 64   Temp 97.9 F (36.6 C)   Ht 5\' 2"  (1.575 m)   Wt 165 lb 3.2 oz (74.9 kg)   SpO2 98%   BMI 30.22 kg/m   Physical Examination:  General: Awake, alert, well nourished, No acute distress HEENT: Normal; sclera white.  Utilizes glasses Cardio: regular rate and rhythm, S1S2 heard, no murmurs appreciated Pulm: clear to auscultation bilaterally, no wheezes, rhonchi or rales; normal work of breathing on room air MSK: Ambulating independently  Assessment/ Plan: 75 y.o. female   Stage 3a chronic kidney disease ([place]) - Plan: VITAMIN D 25 Hydroxy (Vit-D Deficiency, Fractures), DG [facility] DEXA  Anemia due to stage 3a chronic kidney disease (HCC)  Essential hypertension  Paroxysmal atrial fibrillation (HCC)  Malignant neoplasm of ascending colon (HCC)  Mixed hyperlipidemia - Plan: Lipid panel  Osteopenia with high risk of fracture - Plan: DG [facility] DEXA  Plan for vitamin D level with next laboratory draw with her specialist.  DEXA scan ordered given known osteopenia with high fracture risk.  Blood pressure is not at goal today.  I would like her to return in 2 weeks for blood pressure recheck.  If persistently elevated we will plan advance her Norvasc to 10 mg daily  Pertaining to her atrial fibrillation she was last seen by cardiology in February of this year.  It appears that she is off of her anticoagulant now.   She had rate and rhythm control on exam today  No orders of the defined types were placed in this encounter.  No orders of the defined types were placed in this encounter.    Janora Norlander, DO [place] [phone]

## 2021-07-22 NOTE — Patient Instructions (Addendum)
Please have your specialist obtain my labs along with his labs at your next blood draw You should be getting a cholesterol panel and a vitamin D level

## 2021-07-23 LAB — LIPID PANEL
Chol/HDL Ratio: 3.8 ratio (ref 0.0–4.4)
Cholesterol, Total: 192 mg/dL (ref 100–199)
HDL: 51 mg/dL (ref >39)
LDL Chol Calc (NIH): 96 mg/dL (ref 0–99)
Triglycerides: 271 mg/dL — ABNORMAL HIGH (ref 0–149)
VLDL Cholesterol Cal: 45 mg/dL — ABNORMAL HIGH (ref 5–40)

## 2021-07-23 LAB — VITAMIN D 25 HYDROXY (VIT D DEFICIENCY, FRACTURES): Vit D, 25-Hydroxy: 59.1 ng/mL (ref 30.0–100.0)

## 2021-07-28 ENCOUNTER — Ambulatory Visit (INDEPENDENT_AMBULATORY_CARE_PROVIDER_SITE_OTHER): Payer: Medicare HMO | Admitting: *Deleted

## 2021-07-28 ENCOUNTER — Telehealth: Payer: Self-pay | Admitting: *Deleted

## 2021-07-28 DIAGNOSIS — C182 Malignant neoplasm of ascending colon: Secondary | ICD-10-CM | POA: Diagnosis not present

## 2021-07-28 DIAGNOSIS — I10 Essential (primary) hypertension: Secondary | ICD-10-CM

## 2021-07-28 DIAGNOSIS — Z748 Other problems related to care provider dependency: Secondary | ICD-10-CM

## 2021-07-28 NOTE — Telephone Encounter (Signed)
   Telephone encounter was:  Successful.  07/28/2021 Name: ADALYNA GODBEE MRN: [id number] DOB: 04-11-1946  RAYONNA HELDMAN is a 75 y.o. year old female who is a primary care patient of Janora Norlander, DO . The community resource team was consulted for assistance with Transportation Needs   Care guide performed the following interventions: Patient provided with information about care guide support team and interviewed to confirm resource needs.  Follow Up Plan:  Care guide will follow up with patient by phone over the next day  [place], Care Management  [phone] [address] , [place] [address] Email : [email or web address]

## 2021-07-28 NOTE — Progress Notes (Signed)
Pt daughter r/c to let nurse know pt ate a banana that morning

## 2021-07-29 ENCOUNTER — Telehealth: Payer: Self-pay | Admitting: *Deleted

## 2021-07-29 NOTE — Telephone Encounter (Signed)
   Telephone encounter was:  Successful.  07/29/2021 Name: Gabrielle [name] MRN: [id number] DOB: 11-02-1946  Gabrielle [name] is a 75 y.o. year old female who is a primary care patient of Janora Norlander, DO . The community resource team was consulted for assistance with Transportation Needs   Care guide performed the following interventions: Discussed resources to assist with [employer] .  Follow Up Plan:  No further follow up planned at this time. The patient has been provided with needed resources.  [place], Care Management  [phone] [address] , [place] [address] Email : [email or web address]

## 2021-07-29 NOTE — Telephone Encounter (Signed)
   Telephone encounter was:  Unsuccessful.  07/29/2021 Name: ANBER MCKIVER MRN: [id number] DOB: 1946/09/02  Unsuccessful outbound call made today to assist with:  explained to check junk mail for email sent yesterday with patients referal resources   Outreach Attempt:  2nd Attempt  A HIPAA compliant voice message was left requesting a return call.  Instructed patient to call back at explained to check junk mail for email sent yesterday with patients referal resources and will close patient as Daughter has call back information   [place], Care Management  [phone] [address] , [place] [address] Email : [email or web address]

## 2021-07-30 ENCOUNTER — Other Ambulatory Visit: Payer: Self-pay

## 2021-07-30 ENCOUNTER — Telehealth: Payer: Self-pay

## 2021-07-30 ENCOUNTER — Ambulatory Visit (HOSPITAL_COMMUNITY)
Admission: RE | Admit: 2021-07-30 | Discharge: 2021-07-30 | Disposition: A | Discharge status: Home / Self Care | Payer: Medicare HMO | Source: Ambulatory Visit | Attending: Hematology | Admitting: Hematology

## 2021-07-30 DIAGNOSIS — N3289 Other specified disorders of bladder: Secondary | ICD-10-CM | POA: Diagnosis not present

## 2021-07-30 DIAGNOSIS — C189 Malignant neoplasm of colon, unspecified: Secondary | ICD-10-CM | POA: Insufficient documentation

## 2021-07-30 DIAGNOSIS — K6389 Other specified diseases of intestine: Secondary | ICD-10-CM | POA: Diagnosis not present

## 2021-07-30 DIAGNOSIS — I700 Atherosclerosis of aorta: Secondary | ICD-10-CM | POA: Diagnosis not present

## 2021-07-30 DIAGNOSIS — Z9889 Other specified postprocedural states: Secondary | ICD-10-CM | POA: Diagnosis not present

## 2021-07-30 DIAGNOSIS — R918 Other nonspecific abnormal finding of lung field: Secondary | ICD-10-CM | POA: Diagnosis not present

## 2021-07-30 MED ORDER — IOHEXOL 350 MG/ML SOLN
85.0000 mL | Freq: Once | INTRAVENOUS | Status: AC | PRN
  Administered 2021-07-30: 85 mL via INTRAVENOUS

## 2021-07-30 NOTE — Chronic Care Management (AMB) (Signed)
  Chronic Care Management   Note  07/30/2021 Name: ARIYAH SEDLACK MRN: [id number] DOB: June 30, 1946  XCARET MORAD is a 75 y.o. year old female who is a primary care patient of Janora Norlander, DO. EVERETTE DIMAURO is currently enrolled in care management services. An additional referral for LCSW  was placed.   Follow up plan: Unsuccessful telephone outreach attempt made. A HIPAA compliant phone message was left for the patient providing contact information and requesting a return call.  The care management team will reach out to the patient again over the next 5 days.  If patient returns call to provider office, please advise to call [facility]  at [place], [place], [place], [place] [address] Direct Dial: [phone] [email or web address]

## 2021-08-02 NOTE — Chronic Care Management (AMB) (Signed)
Chronic Care Management   CCM RN Visit Note  07/28/2021 Name: Gabrielle [name] MRN: [id number] DOB: 21-Jan-1946  Subjective: Gabrielle [name] is a 75 y.o. year old female who is a primary care patient of Janora Norlander, DO. The care management team was consulted for assistance with disease management and care coordination needs.    Engaged with patient by telephone for follow up visit in response to provider referral for case management and/or care coordination services.   Consent to Services:  The patient was given the following information about Chronic Care Management services today, agreed to services, and gave verbal consent: 1. CCM service includes personalized support from designated clinical staff supervised by the primary care provider, including individualized plan of care and coordination with other care providers 2. 24/7 contact phone numbers for assistance for urgent and routine care needs. 3. Service will only be billed when office clinical staff spend 20 minutes or more in a month to coordinate care. 4. Only one practitioner may furnish and bill the service in a calendar month. 5.The patient may stop CCM services at any time (effective at the end of the month) by phone call to the office staff. 6. The patient will be responsible for cost sharing (co-pay) of up to 20% of the service fee (after annual deductible is met). Patient agreed to services and consent obtained.  Patient agreed to services and verbal consent obtained.   Assessment: Review of patient past medical history, allergies, medications, health status, including review of consultants reports, laboratory and other test data, was performed as part of comprehensive evaluation and provision of chronic care management services.   SDOH (Social Determinants of Health) assessments and interventions performed:    CCM Care Plan  Allergies  Allergen Reactions   Lisinopril Cough    Outpatient Encounter Medications as of  07/28/2021  Medication Sig   alendronate (FOSAMAX) 70 MG tablet Take 1 tablet (70 mg total) by mouth every 7 (seven) days. Take with a full glass of water on an empty stomach.   amLODipine (NORVASC) 5 MG tablet Take 1 tablet (5 mg total) by mouth daily.   atorvastatin (LIPITOR) 20 MG tablet Take 1 tablet (20 mg total) by mouth daily.   cholecalciferol (VITAMIN D3) 25 MCG (1000 UT) tablet Take 1,000 Units by mouth daily.   CRANBERRY FRUIT PO Take 1 tablet by mouth daily.   diclofenac Sodium (VOLTAREN) 1 % GEL APPLY 4 GRAMS TO AFFECTED AREA(S) 4 TIMES A DAY   dorzolamide-timolol (COSOPT) 22.3-6.8 MG/ML ophthalmic solution PLACE 1 DROP INTO BOTH EYES 2 TIMES A DAY (Patient taking differently: Place 1 drop into both eyes 1 day or 1 dose.)   dorzolamide-timolol (COSOPT) 22.3-6.8 MG/ML ophthalmic solution Place 1 drop into both eyes 2 (two) times daily.   ELIQUIS 5 MG TABS tablet Take 5 mg by mouth 2 (two) times daily.   FEROSUL 325 (65 Fe) MG tablet TAKE  (1)  TABLET TWICE A DAY WITH MEALS (BREAKFAST AND SUPPER)   Garlic 174 MG TABS Take by mouth.   lidocaine-prilocaine (EMLA) cream Apply a small amount to port a cath site and cover with plastic wrap 1 hour prior to chemotherapy appointments   losartan-hydrochlorothiazide (HYZAAR) 100-25 MG tablet Take 1 tablet by mouth daily.   potassium chloride SA (KLOR-CON) 20 MEQ tablet Take 1 tablet (20 mEq total) by mouth daily.   prochlorperazine (COMPAZINE) 10 MG tablet Take 1 tablet (10 mg total) by mouth every 6 (six) hours as needed  for nausea or vomiting.   No facility-administered encounter medications on file as of 07/28/2021.    Patient Active Problem List   Diagnosis Date Noted   Goals of care, counseling/discussion 11/05/2020   History of Clostridioides difficile colitis 09/10/2020   Family history of pancreatic cancer 08/21/2020   Family history of uterine cancer 08/21/2020   Genetic testing 08/21/2020   Hypoalbuminemia 07/05/2020    Hyponatremia 07/05/2020   Leukocytosis 07/05/2020   Stroke (cerebrum) ([place]) 07/05/2020   Sepsis secondary to UTI ([place]) 07/04/2020   Hospital discharge follow-up 06/15/2020   DNR (do not resuscitate) 06/02/2020   Acute kidney injury ([place]) 05/28/2020   Acute on chronic renal failure ([place]) 05/28/2020   Iron deficiency anemia due to chronic blood loss 05/19/2020   Urinary retention 04/15/2020   Bladder wall thickening    Malignant neoplasm of colon (HCC)    Gastrointestinal hemorrhage    Hypokalemia    Macular degeneration    Symptomatic anemia 04/04/2020   Anemia 08/28/2019   High risk for hip fracture 06/18/2019   Osteopenia after menopause 06/18/2019   Stage 3 chronic kidney disease ([place]) 06/18/2019   Essential hypertension 05/15/2019   Other hyperlipidemia 05/15/2019    Conditions to be addressed/monitored:Hypertension, hyperlipidemia, CKD, macular degeneration, colon cancer, Afib, grief reaction  Care Plan : [facility] Care Plan     Problem: Chronic Disease Management Needs   Priority: Medium     Goal: Work with [facility] Regarding Care Management and Care Coordination Associated with Hypertension, hyperlipidemia, CKD, macular degeneration, colon cancer, Afib   Start Date: 07/28/2021  This Visit's Progress: On track  Priority: Medium  Note:   Current Barriers:  Knowledge Deficits related to plan of care for management of Grief  Care Coordination needs related to Transportation  Chronic Disease Management support and education needs related to HTN and Grief Coexisting Chronic Conditions: Hypertension, hyperlipidemia, CKD, macular degeneration, colon cancer, Afib  RNCM Clinical Goal(s):  Patient will take all medications exactly as prescribed and will call provider for medication related questions continue to work with RN Care Manager to address care management and care coordination needs related to HTN  work with Education officer, museum to address [place] Concerns  related to the  management of grief work with community resource care guide to address needs related to Transportation to oncology appointments at [facility]  through collaboration with Consulting civil engineer, provider, and care team.   Interventions: 1:1 collaboration with primary care provider regarding development and update of comprehensive plan of care as evidenced by provider attestation and co-signature Inter-disciplinary care team collaboration (see longitudinal plan of care) Evaluation of current treatment plan related to  self management and patient's adherence to plan as established by provider   SDOH Barriers (Status: New goal.)  Patient interviewed and SDOH assessment performed  Referred patient to community resources care guide team for assistance with transportation Provided patient with information about general transportation assistance information  Discussed plans with patient for ongoing care management follow up and provided patient with direct contact information for care management team Discussed limitations due to vision and hearing empairment   Grief Reaction:  (Status: New goal.) Discussed plans with patient for ongoing care management follow up and provided patient with direct contact information for care management team Talked with patient regarding recent unexpected death of 67 year son Extensive therapeutic listening utilized and compassion provided regarding recent death of son Discussed family/social support Has good family support but minimal social support Lives in an apartment  complex and does have some friends that she can talk  Talked with daughter, Ashok Norris, regarding current state of health and grief reaction Disucssed services offered by CCM team LCSW. Patient is agreeable.  Collaborated with LCSW regaring grief reaction Collaborated with CCM Care Guides requesting that they contact patient to schedule an appointment with Theadore Nan, LCSW for counseling serivces Discussed  personal medical conditions and current state of health Undergoing treatment for colon cancer and hypertension Provided with RNCM contact number and encouraged to reach out as needed  Hypertension: (Status: Goal on track: NO.) Last practice recorded BP readings:  BP Readings from Last 3 Encounters:  07/22/21 (!) 173/72  07/13/21 (!) 159/66  06/22/21 (!) 142/58  Most recent eGFR/CrCl: No results found for: EGFR  No components found for: CRCL  Evaluation of current treatment plan related to hypertension self management and patient's adherence to plan as established by provider;   Reviewed medications with patient and discussed importance of compliance;  Advised patient, providing education and rationale, to monitor blood pressure daily and record, calling PCP for findings outside established parameters;  Discussed complications of poorly controlled blood pressure such as heart disease, stroke, circulatory complications, vision complications, kidney impairment, sexual dysfunction;  Reviewed upcoming appointments  Patient Goals/Self-Care Activities: Patient will self administer medications as prescribed Patient will call pharmacy for medication refills Patient will continue to perform ADL's independently Patient will call provider office for new concerns or questions       Plan:Telephone follow up appointment with care management team member scheduled for:  08/06/21 with RNCM and The patient has been provided with contact information for the care management team and has been advised to call with any health related questions or concerns.   Chong Sicilian, BSN, RN-BC Embedded Chronic Care Manager [facility] Direct Dial: [phone]

## 2021-08-02 NOTE — Progress Notes (Signed)
[place],  [address]   CLINIC:  Medical Oncology/Hematology  PCP:  Gabrielle Norlander, DO [address] [place] [address] [phone]   REASON FOR VISIT:  Follow-up for right colon cancer  PRIOR THERAPY:  1. Right hemicolectomy on 04/08/2020. 2. FOLFOX x 3 cycles from 05/19/2020 to 07/14/2020.  NGS Results:  Foundation 1 KRAS/NRAS wild-type, MSI--high, TMB 55 Muts/Mb, BRAF V 600 E  CURRENT THERAPY: Keytruda every 3 weeks  BRIEF ONCOLOGIC HISTORY:  Oncology History  Malignant neoplasm of colon ([place])  04/07/2020 Initial Diagnosis   Malignant neoplasm of ascending colon ([place])   05/13/2020 Genetic Testing   Foundation One     05/19/2020 - 07/02/2020 Chemotherapy   The patient had palonosetron (ALOXI) injection 0.25 mg, 0.25 mg, Intravenous,  Once, 2 of 12 cycles Administration: 0.25 mg (05/19/2020), 0.25 mg (06/30/2020) leucovorin 522 mg in dextrose 5 % 250 mL infusion, 320 mg/m2 = 522 mg (80 % of original dose 400 mg/m2), Intravenous,  Once, 2 of 12 cycles Dose modification: 320 mg/m2 (80 % of original dose 400 mg/m2, Cycle 1, Reason: Provider Judgment), 505.3976 mg/m2 (66.7 % of original dose 400 mg/m2, Cycle 2, Reason: Provider Judgment) Administration: 522 mg (05/19/2020), 434 mg (06/30/2020) oxaliplatin (ELOXATIN) 110 mg in dextrose 5 % 500 mL chemo infusion, 68 mg/m2 = 110 mg (80 % of original dose 85 mg/m2), Intravenous,  Once, 1 of 1 cycle Dose modification: 68 mg/m2 (80 % of original dose 85 mg/m2, Cycle 1, Reason: Provider Judgment) Administration: 110 mg (05/19/2020) fluorouracil (ADRUCIL) chemo injection 500 mg, 320 mg/m2 = 500 mg (80 % of original dose 400 mg/m2), Intravenous,  Once, 2 of 12 cycles Dose modification: 320 mg/m2 (80 % of original dose 400 mg/m2, Cycle 1, Reason: Provider Judgment), 734.1937 mg/m2 (66.7 % of original dose 400 mg/m2, Cycle 2, Reason: Provider Judgment) Administration: 500 mg (05/19/2020), 450  mg (06/30/2020) fluorouracil (ADRUCIL) 3,150 mg in sodium chloride 0.9 % 87 mL chemo infusion, 1,920 mg/m2 = 3,150 mg (80 % of original dose 2,400 mg/m2), Intravenous, 1 Day/Dose, 2 of 12 cycles Dose modification: 1,920 mg/m2 (80 % of original dose 2,400 mg/m2, Cycle 1, Reason: Provider Judgment), 1,600 mg/m2 (66.7 % of original dose 2,400 mg/m2, Cycle 2, Reason: Provider Judgment) Administration: 3,150 mg (05/19/2020), 2,600 mg (06/30/2020)   for chemotherapy treatment.     08/03/2020 Genetic Testing   Guardant Reveal Testing     08/14/2020 Genetic Testing   No pathogenic variants detected in [facility] Common Hereditary Cancers Panel.  Variant of uncertain significance (VUS) detected in HOXB13 at c.634G>A (p.Ala212Thr). The Common Hereditary Cancers Panel offered by [facility] includes sequencing and/or deletion duplication testing of the following 48 genes: APC, ATM, AXIN2, BARD1, BMPR1A, BRCA1, BRCA2, BRIP1, CDH1, CDK4, CDKN2A (p14ARF), CDKN2A (p16INK4a), CHEK2, CTNNA1, DICER1, EPCAM (Deletion/duplication testing only), FLCN, GREM1 (promoter region deletion/duplication testing only), KIT, MEN1, MLH1, MSH2, MSH3, MSH6, MUTYH, NBN, NF1, NHTL1, PALB2, PDGFRA, PMS2, POLD1, POLE, PTEN, RAD50, RAD51C, RAD51D, RNF43, SDHB, SDHC, SDHD, SMAD4, SMARCA4. STK11, TP53, TSC1, TSC2, and VHL.  The following genes were evaluated for sequence changes only: SDHA and HOXB13 c.251G>A variant only. The report date is August 14, 2020.    11/05/2020 Cancer Staging   Staging form: Colon and Rectum, AJCC 8th Edition - Pathologic stage from 11/05/2020: Stage IVC (rpTX, pN0, pM1c) - Signed by Gabrielle Jack, MD on 11/05/2020   11/10/2020 -  Chemotherapy    Patient is on Treatment Plan:  COLORECTAL PEMBROLIZUMAB Q21D         CANCER STAGING: Cancer Staging Malignant neoplasm of colon [facility]) Staging form: Colon and Rectum, AJCC 8th Edition - Clinical stage from 04/27/2020: Stage IIIB (cT4a, cN1a, cM0) - Unsigned -  Pathologic stage from 11/05/2020: Stage IVC (rpTX, pN0, pM1c) - Signed by Gabrielle Jack, MD on 11/05/2020   INTERVAL HISTORY:  Gabrielle [name], a 75 y.o. female, returns for routine follow-up and consideration for next cycle of chemotherapy. [name] was last seen on 06/22/2021.  Due for cycle #12 of Keytruda today.   Overall, she tells me she has been feeling pretty well. She denies n/v/d/c, cough, fever, infection, severe fatigue, difficulty urinating, and skin rash, and she reports dry skin. She has good appetite.   Overall, she feels ready for next cycle of chemo today.   REVIEW OF SYSTEMS:  Review of Systems  Constitutional:  Negative for appetite change, fatigue and fever.  Respiratory:  Negative for cough.   Gastrointestinal:  Negative for constipation, diarrhea, nausea and vomiting.  Genitourinary:  Negative for difficulty urinating.   Skin:  Negative for rash.  All other systems reviewed and are negative.  PAST MEDICAL/SURGICAL HISTORY:  Past Medical History:  Diagnosis Date   Arthritis    Cancer [facility])    Cataract    OU   Colon cancer ([place])    Family history of pancreatic cancer 08/21/2020   Family history of uterine cancer 08/21/2020   H/O cesarean section    Hx of tonsillectomy    Hyperlipidemia    Hypertension    Macular degeneration    Exu ARMD OU   Paroxysmal atrial fibrillation ([place]) 07/05/2020   Past Surgical History:  Procedure Laterality Date   BIOPSY  04/06/2020   Procedure: BIOPSY;  Surgeon: Gabrielle Houston, MD;  [facility];  Service: Endoscopy;;   CARPAL TUNNEL RELEASE Right    CATARACT EXTRACTION W/PHACO Left 10/11/2019   Procedure: CATARACT EXTRACTION PHACO AND INTRAOCULAR LENS PLACEMENT ([place]);  Surgeon: Gabrielle Goldmann, MD;  Location: [facility] ORS;  Service: Ophthalmology;  Laterality: Left;  CDE: 8.56   CATARACT EXTRACTION W/PHACO Right 10/25/2019   Procedure: CATARACT EXTRACTION PHACO AND INTRAOCULAR LENS PLACEMENT ([facility]);  Surgeon:  Gabrielle Goldmann, MD;  Location: [facility] OR[facility];  Service: Ophthalmology;  Laterality: Right;  CDE: 5.67   CESAREAN SECTION     COLONOSCOPY N/A 04/06/2020   Procedure: COLONOSCOPY;  Surgeon: Gabrielle Houston, MD;  Location: [facility];  Service: Endoscopy;  Laterality: N/A;   CYSTOSCOPY WITH BIOPSY N/A 04/08/2020   Procedure: CYSTOSCOPY WITH BIOPSY;  Surgeon: Cleon Gustin, MD;  Location: [facility] OR[facility];  Service: Urology;  Laterality: N/A;   ESOPHAGOGASTRODUODENOSCOPY N/A 04/05/2020   Procedure: ESOPHAGOGASTRODUODENOSCOPY (EGD);  Surgeon: Gabrielle Houston, MD;  Location[facility];  Service: Endoscopy;  Laterality: N/A;   PARTIAL COLECTOMY N/A 04/08/2020   Procedure: PARTIAL COLECTOMY;  Surgeon: Virl Cagey, MD;  Location: [facility] OR[facility];  Service: General;  Laterality: N/A;   PORTACATH PLACEMENT Left 05/18/2020   Procedure: INSERTION PORT-A-CATH (ATTACHED CATHETER IN LEFT SUBCLAVIAN);  Surgeon: Virl Cagey, MD;  Location: [facility];  Service: General;  Laterality: Left;   TONSILLECTOMY      SOCIAL HISTORY:  Social History   Socioeconomic History   Marital status: Divorced    Spouse name: Not on file   Number of children: Not on file   Years of education: Not on file   Highest education level: Not on file  Occupational History   Not on file  Tobacco Use   Smoking status: Never   Smokeless tobacco: Never  Vaping Use   Vaping Use: Never used  Substance and Sexual Activity   Alcohol use: No   Drug use: No   Sexual activity: Not Currently  Other Topics Concern   Not on file  Social History Narrative   Not on file   Social Determinants of Health   Financial Resource Strain: Not on file  Food Insecurity: Not on file  Transportation Needs: Not on file  Physical Activity: Not on file  Stress: Not on file  Social Connections: Not on file  Intimate Partner Violence: Not on file    FAMILY HISTORY:  Family History  Problem Relation Age of Onset   Macular degeneration Mother    Stroke  Mother    Hypertension Mother    Dementia Mother    Lung cancer Father        dx late 28s; smoking hx   Diabetes Sister    Hypertension Sister    Leukemia Paternal Uncle        d. 30s   Cancer Maternal Aunt        ovarian or endometrial dx 57s   Pancreatic cancer Paternal Uncle        d. late 25s    CURRENT MEDICATIONS:  Current Outpatient Medications  Medication Sig Dispense Refill   alendronate (FOSAMAX) 70 MG tablet Take 1 tablet (70 mg total) by mouth every 7 (seven) days. Take with a full glass of water on an empty stomach. 12 tablet 3   amLODipine (NORVASC) 5 MG tablet Take 1 tablet (5 mg total) by mouth daily. 90 tablet 3   atorvastatin (LIPITOR) 20 MG tablet Take 1 tablet (20 mg total) by mouth daily. 90 tablet 3   cholecalciferol (VITAMIN D3) 25 MCG (1000 UT) tablet Take 1,000 Units by mouth daily.     CRANBERRY FRUIT PO Take 1 tablet by mouth daily.     diclofenac Sodium (VOLTAREN) 1 % GEL APPLY 4 GRAMS TO AFFECTED AREA(S) 4 TIMES A DAY 400 g 2   dorzolamide-timolol (COSOPT) 22.3-6.8 MG/ML ophthalmic solution PLACE 1 DROP INTO BOTH EYES 2 TIMES A DAY (Patient taking differently: Place 1 drop into both eyes 1 day or 1 dose.) 10 mL 0   dorzolamide-timolol (COSOPT) 22.3-6.8 MG/ML ophthalmic solution Place 1 drop into both eyes 2 (two) times daily. 10 mL 3   ELIQUIS 5 MG TABS tablet Take 5 mg by mouth 2 (two) times daily.     FEROSUL 325 (65 Fe) MG tablet TAKE  (1)  TABLET TWICE A DAY WITH MEALS (BREAKFAST AND SUPPER) 446 tablet 0   Garlic 286 MG TABS Take by mouth.     lidocaine-prilocaine (EMLA) cream Apply a small amount to port a cath site and cover with plastic wrap 1 hour prior to chemotherapy appointments 30 g 0   losartan-hydrochlorothiazide (HYZAAR) 100-25 MG tablet Take 1 tablet by mouth daily. 90 tablet 3   potassium chloride SA (KLOR-CON) 20 MEQ tablet Take 1 tablet (20 mEq total) by mouth daily. 90 tablet 0   prochlorperazine (COMPAZINE) 10 MG tablet Take 1 tablet  (10 mg total) by mouth every 6 (six) hours as needed for nausea or vomiting. 30 tablet 0   No current facility-administered medications for this visit.    ALLERGIES:  Allergies  Allergen Reactions   Lisinopril Cough    PHYSICAL EXAM:  Performance status (ECOG): 1 -  Symptomatic but completely ambulatory  There were no vitals filed for this visit. Wt Readings from Last 3 Encounters:  07/22/21 165 lb 3.2 oz (74.9 kg)  07/13/21 167 lb 9.6 oz (76 kg)  06/22/21 164 lb 12.8 oz (74.8 kg)   Physical Exam Vitals reviewed.  Constitutional:      Appearance: Normal appearance.  Cardiovascular:     Rate and Rhythm: Normal rate and regular rhythm.     Pulses: Normal pulses.     Heart sounds: Normal heart sounds.  Pulmonary:     Effort: Pulmonary effort is normal.     Breath sounds: Normal breath sounds.  Abdominal:     Palpations: Abdomen is soft. There is no hepatomegaly, splenomegaly or mass.     Tenderness: There is no abdominal tenderness.  Musculoskeletal:     Right lower leg: No edema.     Left lower leg: No edema.  Neurological:     General: No focal deficit present.     Mental Status: She is alert and oriented to person, place, and time.  Psychiatric:        Mood and Affect: Mood normal.        Behavior: Behavior normal.    LABORATORY DATA:  I have reviewed the labs as listed.  CBC Latest Ref Rng & Units 07/13/2021 06/22/2021 06/01/2021  WBC 4.0 - 10.5 K/uL 6.7 7.0 6.0  Hemoglobin 12.0 - 15.0 g/dL 12.7 13.3 12.1  Hematocrit 36.0 - 46.0 % 37.2 39.2 36.2  Platelets 150 - 400 K/uL 143(L) 162 133(L)   CMP Latest Ref Rng & Units 07/13/2021 06/22/2021 06/01/2021  Glucose 70 - 99 mg/dL 87 102(H) 102(H)  BUN 8 - 23 mg/dL _0 Creatinine 0.44 - 1.00 mg/dL 1.09(H) 1.26(H) 1.03(H)  Sodium 135 - 145 mmol/L 136 135 137  Potassium 3.5 - 5.1 mmol/L 3.1(L) 3.3(L) 3.4(L)  Chloride 98 - 111 mmol/L 98 95(L) 99  CO2 22 - 32 mmol/L _1 Calcium 8.9 - 10.3 mg/dL 9.7 9.8 9.6  Total  Protein 6.5 - 8.1 g/dL 7.7 7.8 7.2  Total Bilirubin 0.3 - 1.2 mg/dL 0.7 0.5 0.6  Alkaline Phos 38 - 126 U/L 72 65 58  AST 15 - 41 U/L _2 ALT 0 - 44 U/L _3 DIAGNOSTIC IMAGING:  I have independently reviewed the scans and discussed with the patient. CT CHEST ABDOMEN PELVIS W CONTRAST  Result Date: 07/30/2021 CLINICAL DATA:  Colon cancer, status post right hemicolectomy, ongoing immunotherapy EXAM: CT CHEST, ABDOMEN, AND PELVIS WITH CONTRAST TECHNIQUE: Multidetector CT imaging of the chest, abdomen and pelvis was performed following the standard protocol during bolus administration of intravenous contrast. CONTRAST:  17m OMNIPAQUE IOHEXOL 350 MG/ML SOLN, additional oral enteric contrast COMPARISON:  CT chest abdomen pelvis, 05/05/2021, 02/09/2021, PET-CT, 05/11/2020 FINDINGS: CT CHEST FINDINGS Cardiovascular: Left chest port catheter. Severe aortic atherosclerosis. Normal heart size. No pericardial effusion. Mediastinum/Nodes: No enlarged mediastinal, hilar, or axillary lymph nodes. Thyroid gland, trachea, and esophagus demonstrate no significant findings. Lungs/Pleura: Multiple small pulmonary nodules are unchanged, for example a 5 mm nodule of the left lower lobe (series 3, image 86) and a 6 mm nodule of the right lower lobe (series 3, image 80). No pleural effusion or pneumothorax. Musculoskeletal: No chest wall mass or suspicious bone lesions identified. CT ABDOMEN PELVIS FINDINGS Hepatobiliary: No solid liver abnormality is seen. No gallstones, gallbladder wall thickening, or biliary dilatation. Pancreas: Unremarkable. No pancreatic ductal dilatation  or surrounding inflammatory changes. Spleen: Normal in size. Stable, benign fluid attenuation subcapsular cyst or hemangioma of the medial spleen (series 2, image 43) Adrenals/Urinary Tract: Adrenal glands are unremarkable. Kidneys are normal, without renal calculi, solid lesion, or hydronephrosis. Severe thickening of the urinary bladder  wall. Stomach/Bowel: Stomach is within normal limits. Redemonstrated postoperative findings of right hemicolectomy with ileocolic anastomosis. No evidence of bowel wall thickening, distention, or inflammatory changes. Vascular/Lymphatic: Aortic atherosclerosis. No enlarged abdominal or pelvic lymph nodes. Reproductive: No mass or other abnormality. Other: No abdominal wall hernia or abnormality. No abdominopelvic ascites. No significant change in a triangular soft tissue lesion interposed between the right hemicolectomy site and tip of the liver, measuring 2.0 x 2.0 cm (series 2, image 68). Musculoskeletal: No acute or significant osseous findings. IMPRESSION: 1. Redemonstrated postoperative findings of right hemicolectomy with ileocolic anastomosis. 2. No significant change in a triangular soft tissue lesion interposed between the right hemicolectomy site and tip of the liver, consistent with treated metastasis. 3. No evidence of new metastatic disease in the chest, abdomen, or pelvis. 4. Multiple small pulmonary nodules are unchanged, presumed benign, incidental sequelae of prior infection or inflammation. Attention on follow-up. 5. Severe thickening of the urinary bladder wall, unchanged compared to prior examinations and possibly related to infectious or inflammatory cystitis, or alternately chronic outlet obstruction. Aortic Atherosclerosis (ICD10-I70.0). Electronically Signed   By: Eddie Candle M.D.   On: 07/30/2021 15:23   Intravitreal Injection, Pharmacologic Agent - OS - Left Eye  Result Date: 07/08/2021 Time Out 07/08/2021. 8:37 AM. Confirmed correct patient, procedure, site, and patient consented. Anesthesia Topical anesthesia was used. Anesthetic medications included Lidocaine 2%, Proparacaine 0.5%. Procedure Preparation included 5% betadine to ocular surface, eyelid speculum. A (33g) needle was used. Injection: 2 mg aflibercept 2 MG/0.05ML   Route: Intravitreal, Site: Left Eye   NDC: [id number], Lot:  [id number], Expiration date: 04/04/2022, Waste: 0.05 mL Post-op Post injection exam found visual acuity of at least counting fingers. The patient tolerated the procedure well. There were no complications. The patient received written and verbal post procedure care education. Post injection medications were not given.   OCT, Retina - OU - Both Eyes  Result Date: 07/08/2021 Right Eye Quality was good. Central Foveal Thickness: 523. Progression has been stable. Findings include pigment epithelial detachment, epiretinal membrane, subretinal hyper-reflective material, retinal drusen , abnormal foveal contour, no SRF, intraretinal fluid, disciform scar, outer retinal tubulation, outer retinal atrophy (Stable sub-retinal scar, mild persistent cystic changes overlying). Left Eye Quality was good. Central Foveal Thickness: 321. Progression has been stable. Findings include subretinal hyper-reflective material, pigment epithelial detachment, intraretinal fluid, retinal drusen , outer retinal atrophy, normal foveal contour, no SRF (Trace cystic changes / IRF -- slightly improved, stable ORA/disciform scar). Notes *Images captured and stored on drive Diagnosis / Impression: Exudative ARMD OU OD: Stable sub-retinal scar, mild persistent cystic changes overlying OS: Trace cystic changes / IRF -- slightly improved, stable ORA/disciform scar Clinical management: See below Abbreviations: NFP - Normal foveal profile. CME - cystoid macular edema. PED - pigment epithelial detachment. IRF - intraretinal fluid. SRF - subretinal fluid. EZ - ellipsoid zone. ERM - epiretinal membrane. ORA - outer retinal atrophy. ORT - outer retinal tubulation. SRHM - subretinal hyper-reflective material   DG [facility] DEXA  Result Date: 07/22/2021 CLINICAL DATA:  Postmenopausal osteoporosis screening. EXAM: DUAL X-RAY ABSORPTIOMETRY (DXA) FOR BONE MINERAL DENSITY TECHNIQUE: Bone mineral density measurements are performed of the spine, hip, and forearm, as  appropriate, per International Society  of Clinical Densitometry recommendations. The pertinent regions of interest are reported below. Non-contributory values are not reported. Images are obtained for bone mineral density measurement and are not obtained for diagnostic purposes. FINDINGS: Left FEMUR neck Bone Mineral Density (BMD):  0.803 g/cm2 Young Adult T-Score: -1 7 Z-Score:  0.0 Left FOREARM (1/3 RADIUS) Bone Mineral Density (BMD):  0.705 Young Adult T Score:  -2.1 Z Score:  0.2 Unit: This study was performed at [facility] on a [place]. Scan quality: The scan quality is good. Exclusions: Lumbar spine due to degenerative changes. ASSESSMENT: Patient's diagnostic category is LOW BONE MASS by WHO Criteria. FRACTURE RISK: INCREASED. FRAX: Based on the [place], the 10 year probability of a major osteoporotic fracture is 19.4%. The 10 year probability of a hip fracture is 9.9%. COMPARISON: 0.1% decrease in bone density over the hip compared to 08/02/2018 which is not statistically significant. 4.4% increase in bone density over the left forearm compared to 2019 which is not statistically significant. RECOMMENDATIONS 1. All patients should optimize calcium and vitamin D intake. 2. Consider FDA-approved medical therapies in postmenopausal women and men aged 36 years and older, based on the following: - A hip or vertebral (clinical or morphometric) fracture - T-score less than or equal to -2.5 at the femoral neck or spine after appropriate evaluation to exclude secondary causes - Low bone mass (T-score between -1.0 and -2.5 at the femoral neck or spine) and a 10-year probability of a hip fracture greater than or equal to 3% or a 10-year probability of a major osteoporosis-related fracture greater than or equal to 20% based on the [place]-adapted WHO algorithm - Clinician judgment and/or patient preferences may indicate treatment for people with 10-year fracture  probabilities above or below these levels 3. Patients with diagnosis of osteoporosis or at high risk for fracture should have regular bone mineral density tests. For patients eligible for Medicare, routine testing is allowed once every 2 years. The testing frequency can be increased to one year for patients who have rapidly progressing disease, those who are receiving or discontinuing medical therapy to restore bone mass, or have additional risk factors. Electronically Signed   By: Marin Olp M.D.   On: 07/22/2021 15:44     ASSESSMENT:  1.  Stage IIIb (T4AN1A) poorly differentiated right colon adenocarcinoma: -Right hemicolectomy on 04/07/2020 with poorly differentiated adenocarcinoma, pT4a, positive radial margin, 1/15 lymph nodes positive, loss of MLH1 and PMS2, bladder biopsy negative for malignancy. -CTAP on 04/06/2020 showed circumferential ascending colon mass with numerous borderline enlarged pericolonic lymph nodes.  No findings of hepatic metastatic disease. -CEA on 04/04/2020 was 12.2.  CEA improved to 1.7 on 05/13/2020. -PET scan on 05/11/2020 shows mild FDG uptake associated with the anastomotic site.  Small pulmonary nodules that do not show elevated FDG activity, remain nonspecific, subcentimeter.  Persistent but improved bladder wall thickening. -Cycle 1 of dose reduced FOLFOX on 05/19/2020, followed by 2 hospitalizations, 1 from acute kidney injury from diarrhea and second admission for C. difficile colitis. -Cycle 2 of 5-FU and leucovorin dose reduced on 06/30/2020.  Chemotherapy discontinued secondary to intolerance. -Follow-up CT scan on 11/02/2020 with multiple soft tissue lesions in the central and right mesentery measuring up to 3.1 x 1.7 cm.  Mild lymphadenopathy in the hepatic duodenal ligament, retroperitoneal space, right common iliac chain, bilateral external iliac chains.  17 mm soft tissue nodule in the lower anterior abdominal wall.  Bilateral tiny pulmonary nodules not substantially  changed.  2.9 x 2.5 cm low-density lesion along the posterior uterus is indeterminate. -Pembrolizumab started on 11/10/2020. -CT CAP from 02/09/2021 showed interval near complete resolution of previously demonstrated nodal mass in the ileocolonic mesentery.  Residual ill-defined nodule medial to the tip of the right hepatic lobe.  Stable small pulmonary nodules bilaterally consistent with benign findings.   2.  Family history: -Paternal uncle had colon cancer.  Maternal aunt had brain cancer and another maternal aunt had gynecological malignancy.  Father had lung cancer and was a smoker. - Genetic testing did not reveal any notable mutations.   3.  Diffuse erythema and nodularity of the dome of the bladder: -CT scan showed severely thickened bladder wall. -Cystoscopy on 04/08/2020 showed diffuse erythema, nodularity in the posterior wall tracking to the dome.  Ureteral orifices were in normal locations.  Biopsies were benign.   PLAN:  1.  Stage IV right colon adenocarcinoma, MSI-high: - We reviewed CT scan results and images from 07/30/2021.  Postoperative findings in the right hemicolectomy region.  No change in the triangular soft tissue lesion interposed between the right hemicolectomy site and tip of liver.  No evidence of new metastatic disease.  Multiple small pulmonary nodules are stable indicating midline. - She does not report any immunotherapy related side effects.  She has dry skin.  Advised skin lotion twice daily. - Reviewed her labs today which showed normal LFTs.  Mildly elevated creatinine stable around 1.04.  CBC was grossly normal.  TSH was 2.06. - Would recommend proceeding with Keytruda today and in 3 weeks.  RTC in 6 weeks.   2.  Paroxysmal atrial fibrillation: - Continue Eliquis.  No bleeding issues reported.   3.  Hypertension: - Continue Hyzaar and amlodipine.  Blood pressure is well controlled.  4.  Hypokalemia: - She is taking K-Dur 20 mEq daily.  Potassium has been  consistently low around 3.2. - We will increase potassium to 40 mEq daily.   Orders placed this encounter:  No orders of the defined types were placed in this encounter.    Gabrielle Jack, MD [place] [phone]   I, Thana Ates, am acting as a scribe for Dr. Derek [name].  I, Gabrielle Jack MD, have reviewed the above documentation for accuracy and completeness, and I agree with the above.

## 2021-08-02 NOTE — Patient Instructions (Signed)
Visit Information  PATIENT GOALS:  Goals Addressed             This Visit's Progress    Talk with CCM Team about Grief and Grief Management   On track    Timeframe:  Long-Range Goal Priority:  High Start Date:     07/28/21                        Expected End Date:   12/04/21                 Follow Up Date 08/06/21    Talk with LCSW regarding grief and ways to manage grief Get outside daily Increase activity level as tolerated  Participate in social activities Talk with friends/family daily Visit with friends/family at least several times a week Call RN Care Manager as needed [phone]  Why is this important?   When you are stressed, down or upset, your body reacts too.  For example, your blood pressure may get higher; you may have a headache or stomachache.  When your emotions get the best of you, your body's ability to fight off cold and flu gets weak.  These steps will help you manage your emotions.     Notes:      Track and Manage My Blood Pressure-Hypertension   On track    Timeframe:  Long-Range Goal Priority:  Medium Start Date:     07/28/21                        Expected End Date:  07/28/22                     Follow Up Date 08/06/21    Take all medication as prescribed Eat a low sodium/DASH diet Check and record blood pressure at least 3 times per week Call PCP with any readings outside of recommended range Keep all medical appointments Call RN Care Manager as needed [phone]    Why is this important?   You won't feel high blood pressure, but it can still hurt your blood vessels.  High blood pressure can cause heart or kidney problems. It can also cause a stroke.  Making lifestyle changes like losing a little weight or eating less salt will help.  Checking your blood pressure at home and at different times of the day can help to control blood pressure.  If the doctor prescribes medicine remember to take it the way the doctor ordered.  Call the office  if you cannot afford the medicine or if there are questions about it.     Notes:        Gabrielle [name] was given information about Chronic Care Management services today including:  CCM service includes personalized support from designated clinical staff supervised by her physician, including individualized plan of care and coordination with other care providers 24/7 contact phone numbers for assistance for urgent and routine care needs. Service will only be billed when office clinical staff spend 20 minutes or more in a month to coordinate care. Only one practitioner may furnish and bill the service in a calendar month. The patient may stop CCM services at any time (effective at the end of the month) by phone call to the office staff. The patient will be responsible for cost sharing (co-pay) of up to 20% of the service fee (after annual deductible is met).  Patient agreed to services and verbal  consent obtained.    Patient verbalizes understanding of instructions provided today and agrees to view in [place].   Plan:Telephone follow up appointment with care management team member scheduled for:  08/06/21 with RNCM and The patient has been provided with contact information for the care management team and has been advised to call with any health related questions or concerns.   Chong Sicilian, BSN, RN-BC Embedded Chronic Care Manager [facility] / [place] Management Direct Dial: [phone]

## 2021-08-03 ENCOUNTER — Other Ambulatory Visit: Payer: Self-pay

## 2021-08-03 ENCOUNTER — Inpatient Hospital Stay (HOSPITAL_COMMUNITY): Payer: Medicare HMO

## 2021-08-03 ENCOUNTER — Inpatient Hospital Stay (HOSPITAL_BASED_OUTPATIENT_CLINIC_OR_DEPARTMENT_OTHER): Payer: Medicare HMO | Admitting: Hematology

## 2021-08-03 VITALS — BP 146/60 | HR 67 | Temp 97.7°F | Resp 18 | Wt 167.6 lb

## 2021-08-03 VITALS — BP 150/55 | HR 67 | Temp 98.5°F | Resp 18

## 2021-08-03 DIAGNOSIS — Z79899 Other long term (current) drug therapy: Secondary | ICD-10-CM | POA: Diagnosis not present

## 2021-08-03 DIAGNOSIS — C799 Secondary malignant neoplasm of unspecified site: Secondary | ICD-10-CM | POA: Diagnosis not present

## 2021-08-03 DIAGNOSIS — C189 Malignant neoplasm of colon, unspecified: Secondary | ICD-10-CM | POA: Diagnosis not present

## 2021-08-03 DIAGNOSIS — R6889 Other general symptoms and signs: Secondary | ICD-10-CM | POA: Diagnosis not present

## 2021-08-03 DIAGNOSIS — Z5112 Encounter for antineoplastic immunotherapy: Secondary | ICD-10-CM | POA: Diagnosis not present

## 2021-08-03 DIAGNOSIS — C182 Malignant neoplasm of ascending colon: Secondary | ICD-10-CM | POA: Diagnosis not present

## 2021-08-03 LAB — COMPREHENSIVE METABOLIC PANEL
ALT: 18 U/L (ref 0–44)
AST: 21 U/L (ref 15–41)
Albumin: 4.4 g/dL (ref 3.5–5.0)
Alkaline Phosphatase: 73 U/L (ref 38–126)
Anion gap: 8 (ref 5–15)
BUN: 20 mg/dL (ref 8–23)
CO2: 29 mmol/L (ref 22–32)
Calcium: 9.4 mg/dL (ref 8.9–10.3)
Chloride: 97 mmol/L — ABNORMAL LOW (ref 98–111)
Creatinine, Ser: 1.04 mg/dL — ABNORMAL HIGH (ref 0.44–1.00)
GFR, Estimated: 56 mL/min — ABNORMAL LOW (ref >60)
Glucose, Bld: 98 mg/dL (ref 70–99)
Potassium: 3.2 mmol/L — ABNORMAL LOW (ref 3.5–5.1)
Sodium: 134 mmol/L — ABNORMAL LOW (ref 135–145)
Total Bilirubin: 0.5 mg/dL (ref 0.3–1.2)
Total Protein: 8 g/dL (ref 6.5–8.1)

## 2021-08-03 LAB — CBC WITH DIFFERENTIAL/PLATELET
Abs Immature Granulocytes: 0.03 10*3/uL (ref 0.00–0.07)
Basophils Absolute: 0 10*3/uL (ref 0.0–0.1)
Basophils Relative: 0 %
Eosinophils Absolute: 0.3 10*3/uL (ref 0.0–0.5)
Eosinophils Relative: 4 %
HCT: 39.4 % (ref 36.0–46.0)
Hemoglobin: 13.3 g/dL (ref 12.0–15.0)
Immature Granulocytes: 0 %
Lymphocytes Relative: 22 %
Lymphs Abs: 1.6 10*3/uL (ref 0.7–4.0)
MCH: 32 pg (ref 26.0–34.0)
MCHC: 33.8 g/dL (ref 30.0–36.0)
MCV: 94.7 fL (ref 80.0–100.0)
Monocytes Absolute: 0.7 10*3/uL (ref 0.1–1.0)
Monocytes Relative: 10 %
Neutro Abs: 4.4 10*3/uL (ref 1.7–7.7)
Neutrophils Relative %: 64 %
Platelets: 147 10*3/uL — ABNORMAL LOW (ref 150–400)
RBC: 4.16 MIL/uL (ref 3.87–5.11)
RDW: 13.3 % (ref 11.5–15.5)
WBC: 7 10*3/uL (ref 4.0–10.5)
nRBC: 0 % (ref 0.0–0.2)

## 2021-08-03 LAB — TSH: TSH: 2.069 u[IU]/mL (ref 0.350–4.500)

## 2021-08-03 MED ORDER — POTASSIUM CHLORIDE CRYS ER 20 MEQ PO TBCR
40.0000 meq | EXTENDED_RELEASE_TABLET | Freq: Once | ORAL | Status: AC
  Administered 2021-08-03: 40 meq via ORAL
  Filled 2021-08-03: qty 2

## 2021-08-03 MED ORDER — SODIUM CHLORIDE 0.9% FLUSH
10.0000 mL | INTRAVENOUS | Status: DC | PRN
  Administered 2021-08-03: 10 mL

## 2021-08-03 MED ORDER — SODIUM CHLORIDE 0.9 % IV SOLN: Freq: Once | INTRAVENOUS | Status: AC

## 2021-08-03 MED ORDER — HEPARIN SOD (PORK) LOCK FLUSH 100 UNIT/ML IV SOLN
500.0000 [IU] | Freq: Once | INTRAVENOUS | Status: AC | PRN
  Administered 2021-08-03: 500 [IU]

## 2021-08-03 MED ORDER — POTASSIUM CHLORIDE CRYS ER 20 MEQ PO TBCR: 20.0000 meq | EXTENDED_RELEASE_TABLET | Freq: Two times a day (BID) | ORAL | 1 refills | Status: DC

## 2021-08-03 MED ORDER — SODIUM CHLORIDE 0.9 % IV SOLN
200.0000 mg | Freq: Once | INTRAVENOUS | Status: AC
  Administered 2021-08-03: 200 mg via INTRAVENOUS
  Filled 2021-08-03: qty 8

## 2021-08-03 NOTE — Patient Instructions (Signed)
[facility]  Discharge Instructions: Thank you for choosing [facility] to provide your oncology and hematology care.  If you have a lab appointment with the Cancer Center, please come in thru the Main Entrance and check in at the main information desk.  Wear comfortable clothing and clothing appropriate for easy access to any Portacath or PICC line.   We strive to give you quality time with your provider. You may need to reschedule your appointment if you arrive late (15 or more minutes).  Arriving late affects you and other patients whose appointments are after yours.  Also, if you miss three or more appointments without notifying the office, you may be dismissed from the clinic at the provider's discretion.      For prescription refill requests, have your pharmacy contact our office and allow 72 hours for refills to be completed.    Today you received the following chemotherapy and/or immunotherapy agents Keytruda       To help prevent nausea and vomiting after your treatment, we encourage you to take your nausea medication as directed.  BELOW ARE SYMPTOMS THAT SHOULD BE REPORTED IMMEDIATELY: *FEVER GREATER THAN 100.4 F (38 C) OR HIGHER *CHILLS OR SWEATING *NAUSEA AND VOMITING THAT IS NOT CONTROLLED WITH YOUR NAUSEA MEDICATION *UNUSUAL SHORTNESS OF BREATH *UNUSUAL BRUISING OR BLEEDING *URINARY PROBLEMS (pain or burning when urinating, or frequent urination) *BOWEL PROBLEMS (unusual diarrhea, constipation, pain near the anus) TENDERNESS IN MOUTH AND THROAT WITH OR WITHOUT PRESENCE OF ULCERS (sore throat, sores in mouth, or a toothache) UNUSUAL RASH, SWELLING OR PAIN  UNUSUAL VAGINAL DISCHARGE OR ITCHING   Items with * indicate a potential emergency and should be followed up as soon as possible or go to the Emergency Department if any problems should occur.  Please show the CHEMOTHERAPY ALERT CARD or IMMUNOTHERAPY ALERT CARD at check-in to the Emergency  Department and triage nurse.  Should you have questions after your visit or need to cancel or reschedule your appointment, please contact [facility] [phone]  and follow the prompts.  Office hours are 8:00 a.m. to 4:30 p.m. Monday - Friday. Please note that voicemails left after 4:00 p.m. may not be returned until the following business day.  We are closed weekends and major holidays. You have access to a nurse at all times for urgent questions. Please call the main number to the clinic [phone] and follow the prompts.  For any non-urgent questions, you may also contact your provider using MyChart. We now offer e-Visits for anyone 18 and older to request care online for non-urgent symptoms. For details visit [email or web address].   Also download the MyChart app! Go to the app store, search "MyChart", open the app, select [facility], and log in with your MyChart username and password.  Due to Covid, a mask is required upon entering the hospital/clinic. If you do not have a mask, one will be given to you upon arrival. For doctor visits, patients may have 1 support person aged 18 or older with them. For treatment visits, patients cannot have anyone with them due to current Covid guidelines and our immunocompromised population.  

## 2021-08-03 NOTE — Progress Notes (Signed)
Patient has been examined, vital signs and labs have been reviewed by Dr. Katragadda. ANC, Creatinine, LFTs, hemoglobin, and platelets are within treatment parameters per Dr. Katragadda. Patient is okay to proceed with treatment per M.D.   

## 2021-08-03 NOTE — Progress Notes (Signed)
Pt presents today for Keytruda per provider's order. Vital signs stable and labs within parameters for treatment. Okay for treatment per Dr. Delton Coombes. Pt's potassium was 3.2 per Dr. Marthann Schiller standing orders 66mEq potassium chloride p.o given x 1 dose.  Keytruda given today per MD orders. Tolerated infusion without adverse affects. Vital signs stable. No complaints at this time. Discharged from clinic ambulatory in stable condition. Alert and oriented x 3. F/U with [facility] as scheduled.

## 2021-08-03 NOTE — Patient Instructions (Addendum)
[facility] Discharge Instructions  You were seen today by Dr. Delton Coombes. He went over your recent results, and you received your treatment. Begin taking 2 potassium tablets daily. Dr. Delton Coombes will see you back in 6 weeks for labs and follow up.   Thank you for choosing [facility] to provide your oncology and hematology care.  To afford each patient quality time with our provider, please arrive at least 15 minutes before your scheduled appointment time.   If you have a lab appointment with the [facility] please come in thru the Main Entrance and check in at the main information desk  You need to re-schedule your appointment should you arrive 10 or more minutes late.  We strive to give you quality time with our providers, and arriving late affects you and other patients whose appointments are after yours.  Also, if you no show three or more times for appointments you may be dismissed from the clinic at the providers discretion.     Again, thank you for choosing [facility].  Our hope is that these requests will decrease the amount of time that you wait before being seen by our physicians.       _____________________________________________________________  Should you have questions after your visit to [facility], please contact our office at [phone] between the hours of 8:00 a.m. and 4:30 p.m.  Voicemails left after 4:00 p.m. will not be returned until the following business day.  For prescription refill requests, have your pharmacy contact our office and allow 72 hours.    Cancer Center Support Programs:   > Cancer Support Group  2nd Tuesday of the month 1pm-2pm, Journey Room

## 2021-08-04 LAB — CEA: CEA: 1.8 ng/mL (ref 0.0–4.7)

## 2021-08-06 ENCOUNTER — Ambulatory Visit (INDEPENDENT_AMBULATORY_CARE_PROVIDER_SITE_OTHER): Payer: Medicare HMO | Admitting: *Deleted

## 2021-08-06 DIAGNOSIS — I10 Essential (primary) hypertension: Secondary | ICD-10-CM

## 2021-08-06 DIAGNOSIS — C182 Malignant neoplasm of ascending colon: Secondary | ICD-10-CM

## 2021-08-06 NOTE — Patient Instructions (Signed)
Visit Information  PATIENT GOALS:  Goals Addressed             This Visit's Progress    Talk with CCM Team about Grief and Grief Management   On track    Timeframe:  Long-Range Goal Priority:  High Start Date:     07/28/21                        Expected End Date:   12/04/21                 Follow Up Date 08/19/21    Talk with LCSW regarding grief and ways to manage grief Continue to get outside daily Continue walking daily Participate in social activities Talk with friends/family daily Visit with friends/family at least several times a week Call RN Care Manager as needed [phone]  Why is this important?   When you are stressed, down or upset, your body reacts too.  For example, your blood pressure may get higher; you may have a headache or stomachache.  When your emotions get the best of you, your body's ability to fight off cold and flu gets weak.  These steps will help you manage your emotions.     Notes:      Track and Manage My Blood Pressure-Hypertension   Not on track    Timeframe:  Long-Range Goal Priority:  Medium Start Date:     07/28/21                        Expected End Date:  07/28/22                     Follow Up Date 08/19/21    Take all medication as prescribed Eat a low sodium/DASH diet Check and record blood pressure at least 3 times per week Call PCP with any readings outside of recommended range Keep all medical appointments Call RN Care Manager as needed [phone]    Why is this important?   You won't feel high blood pressure, but it can still hurt your blood vessels.  High blood pressure can cause heart or kidney problems. It can also cause a stroke.  Making lifestyle changes like losing a little weight or eating less salt will help.  Checking your blood pressure at home and at different times of the day can help to control blood pressure.  If the doctor prescribes medicine remember to take it the way the doctor ordered.  Call the  office if you cannot afford the medicine or if there are questions about it.     Notes:      Work with Oncology Team Regarding Cancer Treatment   On track    Timeframe:  Long-Range Goal Priority:  Medium Start Date:  08/06/21                           Expected End Date:    08/06/22                   Follow-up: 08/19/21  Keep all medical appointments Call [facility] as needed [phone] Arrange transportation assistance to appointments and call CCM team if you need any help Continue to exercise daily Eat nutritious meals with a variety of fruits, vegetables, protein, and healthy fats         Patient verbalizes understanding of instructions provided today and agrees  to view in [place].   Telephone follow up appointment with care management team member scheduled for: 08/19/21 with RNCM  Chong Sicilian, BSN, RN-BC [place] / [facility] Management Direct Dial: [phone]

## 2021-08-06 NOTE — Chronic Care Management (AMB) (Signed)
Chronic Care Management   CCM RN Visit Note  08/06/2021 Name: Gabrielle [name] MRN: [id number] DOB: June 01, 1946  Subjective: Gabrielle [name] is a 75 y.o. year old female who is a primary care patient of Janora Norlander, DO. The care management team was consulted for assistance with disease management and care coordination needs.    Engaged with patient by telephone for follow up visit in response to provider referral for case management and/or care coordination services.   Consent to Services:  The patient was given information about Chronic Care Management services, agreed to services, and gave verbal consent prior to initiation of services.  Please see initial visit note for detailed documentation.   Patient agreed to services and verbal consent obtained.   Assessment: Review of patient past medical history, allergies, medications, health status, including review of consultants reports, laboratory and other test data, was performed as part of comprehensive evaluation and provision of chronic care management services.   SDOH (Social Determinants of Health) assessments and interventions performed:    CCM Care Plan  Allergies  Allergen Reactions   Lisinopril Cough    Outpatient Encounter Medications as of 08/06/2021  Medication Sig   alendronate (FOSAMAX) 70 MG tablet Take 1 tablet (70 mg total) by mouth every 7 (seven) days. Take with a full glass of water on an empty stomach.   amLODipine (NORVASC) 5 MG tablet Take 1 tablet (5 mg total) by mouth daily.   atorvastatin (LIPITOR) 20 MG tablet Take 1 tablet (20 mg total) by mouth daily.   cholecalciferol (VITAMIN D3) 25 MCG (1000 UT) tablet Take 1,000 Units by mouth daily.   CRANBERRY FRUIT PO Take 1 tablet by mouth daily.   diclofenac Sodium (VOLTAREN) 1 % GEL APPLY 4 GRAMS TO AFFECTED AREA(S) 4 TIMES A DAY   dorzolamide-timolol (COSOPT) 22.3-6.8 MG/ML ophthalmic solution PLACE 1 DROP INTO BOTH EYES 2 TIMES A DAY (Patient taking  differently: Place 1 drop into both eyes 1 day or 1 dose.)   dorzolamide-timolol (COSOPT) 22.3-6.8 MG/ML ophthalmic solution Place 1 drop into both eyes 2 (two) times daily.   ELIQUIS 5 MG TABS tablet Take 5 mg by mouth 2 (two) times daily.   FEROSUL 325 (65 Fe) MG tablet TAKE  (1)  TABLET TWICE A DAY WITH MEALS (BREAKFAST AND SUPPER)   Garlic 614 MG TABS Take by mouth.   lidocaine-prilocaine (EMLA) cream Apply a small amount to port a cath site and cover with plastic wrap 1 hour prior to chemotherapy appointments   losartan-hydrochlorothiazide (HYZAAR) 100-25 MG tablet Take 1 tablet by mouth daily.   potassium chloride SA (KLOR-CON) 20 MEQ tablet Take 1 tablet (20 mEq total) by mouth 2 (two) times daily.   prochlorperazine (COMPAZINE) 10 MG tablet Take 1 tablet (10 mg total) by mouth every 6 (six) hours as needed for nausea or vomiting.   No facility-administered encounter medications on file as of 08/06/2021.    Patient Active Problem List   Diagnosis Date Noted   Goals of care, counseling/discussion 11/05/2020   History of Clostridioides difficile colitis 09/10/2020   Family history of pancreatic cancer 08/21/2020   Family history of uterine cancer 08/21/2020   Genetic testing 08/21/2020   Hypoalbuminemia 07/05/2020   Hyponatremia 07/05/2020   Leukocytosis 07/05/2020   Stroke (cerebrum) ([place]) 07/05/2020   Sepsis secondary to UTI [facility]) 07/04/2020   Hospital discharge follow-up 06/15/2020   DNR (do not resuscitate) 06/02/2020   Acute kidney injury ([place]) 05/28/2020   Acute on  chronic renal failure ([place]) 05/28/2020   Iron deficiency anemia due to chronic blood loss 05/19/2020   Urinary retention 04/15/2020   Bladder wall thickening    Malignant neoplasm of colon [facility])    Gastrointestinal hemorrhage    Hypokalemia    Macular degeneration    Symptomatic anemia 04/04/2020   Anemia 08/28/2019   High risk for hip fracture 06/18/2019   Osteopenia after menopause 06/18/2019   Stage 3  chronic kidney disease ([place]) 06/18/2019   Essential hypertension 05/15/2019   Other hyperlipidemia 05/15/2019    Conditions to be addressed/monitored: hypertension, Colon cancer, grief reaction, transportation assistance  Care Plan : [place]  Updates made by Ilean China, RN since 08/06/2021 12:00 AM     Problem: Chronic Disease Management Needs   Priority: Medium     Long-Range Goal: Work with [facility] Regarding Care Management and Care Coordination Associated with Hypertension, hyperlipidemia, CKD, macular degeneration, colon cancer, Afib   Start Date: 07/28/2021  This Visit's Progress: On track  Recent Progress: On track  Priority: Medium  Note:   Current Barriers:  Knowledge Deficits related to plan of care for management of Grief  Care Coordination needs related to Transportation  Chronic Disease Management support and education needs related to HTN and Grief Coexisting Chronic Conditions: Hypertension, hyperlipidemia, CKD, macular degeneration, colon cancer, Afib  RNCM Clinical Goal(s):  Patient will take all medications exactly as prescribed and will call provider for medication related questions continue to work with RN Care Manager to address care management and care coordination needs related to HTN  work with Education officer, museum to address [place] Concerns  related to the management of grief work with community resource care guide to address needs related to Transportation to oncology appointments at [facility]  through collaboration with Consulting civil engineer, provider, and care team.   Interventions: 1:1 collaboration with primary care provider regarding development and update of comprehensive plan of care as evidenced by provider attestation and co-signature Inter-disciplinary care team collaboration (see longitudinal plan of care) Evaluation of current treatment plan related to  self management and patient's adherence to plan as established by provider   SDOH  Barriers (Status: Goal on track: YES.)  Discussed recent transportation assistance provided by [facility] Care Guides for appt to [place] center in response to [facility] referral There was an extended wait time at patient pickup after appointment but overall pickup and drop off went well Patient will use services again in the future if needed Encouraged patient to reach out to CCM team regarding SDOH needs    Grief Reaction:  (Status: Goal on track: YES.) Previously talked with patient regarding grief reaction to sudden death of 49 year old son Previously discussed family/social support Discussed recommendation to talk with LCSW regarding grief and any other psychosocial concerns. Patient is agreeable. Advised that Care Guides have reached out to schedule an appointment and that they will outreach again or she can return their call to schedule.  Encouraged patient to reach out to [facility] as needed  Hypertension: (Status: Goal on track: NO.) Last practice recorded BP readings:  BP Readings from Last 3 Encounters:  07/22/21 (!) 173/72  07/13/21 (!) 159/66  06/22/21 (!) 142/58  Most recent eGFR/CrCl: No results found for: EGFR  No components found for: CRCL  Evaluation of current treatment plan related to hypertension self management and patient's adherence to plan as established by provider;   Reviewed medications with patient and discussed importance of compliance;  Advised patient, providing education and rationale, to monitor blood pressure daily and record, calling PCP for findings outside established parameters;  Discussed complications of poorly controlled blood pressure such as heart disease, stroke, circulatory complications, vision complications, kidney impairment, sexual dysfunction;  Reviewed upcoming appointments   Cancer:  (Status: Goal on track: YES.) Reviewed and discussed recent office visits and lab results Reviewed and discussed medications and cancer treatments Discussed  side effects Patient reports feeling well and denies any side effects Discussed current health status and recent appt with [facility]. Patient was happy with her most recent report and seems very opimistic that cancer treatment has been effective Reviewed upcoming appointments and discussed transportation. See SDOH care plan.  Discussed ongoing family support Discussed mobility, ADLs, and physical activity level Patient is able to complete ADLs Patient is walking outside several times a day. She voiced knowing that staying active is important for her health and well-being   Patient Goals/Self-Care Activities: Patient will self administer medications as prescribed Patient will call pharmacy for medication refills Patient will continue to perform ADL's independently Patient will call provider office for new concerns or questions       Plan:Telephone follow up appointment with care management team member scheduled for:  08/19/21  Chong Sicilian, BSN, RN-BC [place] / [place] Management Direct Dial: [phone]

## 2021-08-17 NOTE — Progress Notes (Addendum)
[facility] Note  08/19/2021     CHIEF COMPLAINT Patient presents for Retina Follow Up   HISTORY OF PRESENT ILLNESS: Gabrielle [name] is a 75 y.o. female who presents to the clinic today for:   HPI     Retina Follow Up   Patient presents with  Wet AMD.  In both eyes.  Severity is severe.  Duration of 6 weeks.  Since onset it is stable.  I, the attending physician,  performed the HPI with the patient and updated documentation appropriately.        Comments   Pt states vision seems pretty good in the morning, but starts to cloud up in the evenings, pt is using cosopt BID OU, AT's QID and Refresh ointment at night      Last edited by Bernarda Caffey, MD on 08/20/2021 10:38 PM.     pt states vision does okay in the morning but clouds up in the evenings, she states her eyes get "crusty" off and on throughout the day, she is using Cosopt BID OU, AT's QID and Refresh PM  Referring physician: Janora Norlander, DO [place],  [place] [address]  HISTORICAL INFORMATION:   Selected notes from the MEDICAL RECORD NUMBER Referred by Dr. Cristela Blue for concern of ARMD OU   CURRENT MEDICATIONS: Current Outpatient Medications (Ophthalmic Drugs)  Medication Sig   dorzolamide-timolol (COSOPT) 22.3-6.8 MG/ML ophthalmic solution PLACE 1 DROP INTO BOTH EYES 2 TIMES A DAY (Patient taking differently: Place 1 drop into both eyes 1 day or 1 dose.)   dorzolamide-timolol (COSOPT) 22.3-6.8 MG/ML ophthalmic solution Place 1 drop into both eyes 2 (two) times daily.   No current facility-administered medications for this visit. (Ophthalmic Drugs)   Current Outpatient Medications (Other)  Medication Sig   alendronate (FOSAMAX) 70 MG tablet Take 1 tablet (70 mg total) by mouth every 7 (seven) days. Take with a full glass of water on an empty stomach.   amLODipine (NORVASC) 5 MG tablet Take 1 tablet (5 mg total) by mouth daily.   atorvastatin (LIPITOR) 20 MG tablet  Take 1 tablet (20 mg total) by mouth daily.   cholecalciferol (VITAMIN D3) 25 MCG (1000 UT) tablet Take 1,000 Units by mouth daily.   CRANBERRY FRUIT PO Take 1 tablet by mouth daily.   diclofenac Sodium (VOLTAREN) 1 % GEL APPLY 4 GRAMS TO AFFECTED AREA(S) 4 TIMES A DAY   ELIQUIS 5 MG TABS tablet Take 5 mg by mouth 2 (two) times daily.   FEROSUL 325 (65 Fe) MG tablet TAKE  (1)  TABLET TWICE A DAY WITH MEALS (BREAKFAST AND SUPPER)   Garlic 563 MG TABS Take by mouth.   lidocaine-prilocaine (EMLA) cream Apply a small amount to port a cath site and cover with plastic wrap 1 hour prior to chemotherapy appointments   losartan-hydrochlorothiazide (HYZAAR) 100-25 MG tablet Take 1 tablet by mouth daily.   potassium chloride SA (KLOR-CON) 20 MEQ tablet Take 1 tablet (20 mEq total) by mouth 2 (two) times daily.   prochlorperazine (COMPAZINE) 10 MG tablet Take 1 tablet (10 mg total) by mouth every 6 (six) hours as needed for nausea or vomiting.   No current facility-administered medications for this visit. (Other)      REVIEW OF SYSTEMS: ROS   Positive for: Musculoskeletal, Cardiovascular, Eyes Negative for: Constitutional, Gastrointestinal, Neurological, Skin, Genitourinary, HENT, Endocrine, Respiratory, Psychiatric, Allergic/Imm, Heme/Lymph Last edited by Debbrah Alar, COT on 08/19/2021  7:49  AM.          ALLERGIES Allergies  Allergen Reactions   Lisinopril Cough    PAST MEDICAL HISTORY Past Medical History:  Diagnosis Date   Arthritis    Cancer [facility])    Cataract    OU   Colon cancer ([place])    Family history of pancreatic cancer 08/21/2020   Family history of uterine cancer 08/21/2020   H/O cesarean section    Hx of tonsillectomy    Hyperlipidemia    Hypertension    Macular degeneration    Exu ARMD OU   Paroxysmal atrial fibrillation ([place]) 07/05/2020   Past Surgical History:  Procedure Laterality Date   BIOPSY  04/06/2020   Procedure: BIOPSY;  Surgeon: Rogene Houston, MD;   Location: [facility];  Service: Endoscopy;;   CARPAL TUNNEL RELEASE Right    CATARACT EXTRACTION W/PHACO Left 10/11/2019   Procedure: CATARACT EXTRACTION PHACO AND INTRAOCULAR LENS PLACEMENT ([place]);  Surgeon: Baruch Goldmann, MD;  Location: [facility] ORS;  Service: Ophthalmology;  Laterality: Left;  CDE: 8.56   CATARACT EXTRACTION W/PHACO Right 10/25/2019   Procedure: CATARACT EXTRACTION PHACO AND INTRAOCULAR LENS PLACEMENT (I[facility]);  Surgeon: Baruch Goldmann, MD;  Location: [facility] OR[facility];  Service: Ophthalmology;  Laterality: Right;  CDE: 5.67   CESAREAN SECTION     COLONOSCOPY N/A 04/06/2020   Procedure: COLONOSCOPY;  Surgeon: Rogene Houston, MD;  Location: [facility];  Service: Endoscopy;  Laterality: N/A;   CYSTOSCOPY WITH BIOPSY N/A 04/08/2020   Procedure: CYSTOSCOPY WITH BIOPSY;  Surgeon: Cleon Gustin, MD;  Location: [facility] ORS;  Service: Urology;  Laterality: N/A;   ESOPHAGOGASTRODUODENOSCOPY N/A 04/05/2020   Procedure: ESOPHAGOGASTRODUODENOSCOPY (EGD);  Surgeon: Rogene Houston, MD;  [facility];  Service: Endoscopy;  Laterality: N/A;   PARTIAL COLECTOMY N/A 04/08/2020   Procedure: PARTIAL COLECTOMY;  Surgeon: Virl Cagey, MD;  Location: [facility] ORS;  Service: General;  Laterality: N/A;   PORTACATH PLACEMENT Left 05/18/2020   Procedure: INSERTION PORT-A-CATH (ATTACHED CATHETER IN LEFT SUBCLAVIAN);  Surgeon: Virl Cagey, MD;  Location: [facility];  Service: General;  Laterality: Left;   TONSILLECTOMY      FAMILY HISTORY Family History  Problem Relation Age of Onset   Macular degeneration Mother    Stroke Mother    Hypertension Mother    Dementia Mother    Lung cancer Father        dx late 37s; smoking hx   Diabetes Sister    Hypertension Sister    Leukemia Paternal Uncle        d. 64s   Cancer Maternal Aunt        ovarian or endometrial dx 71s   Pancreatic cancer Paternal Uncle        d. late 56s    SOCIAL HISTORY Social History   Tobacco Use   Smoking status: Never    Smokeless tobacco: Never  Vaping Use   Vaping Use: Never used  Substance Use Topics   Alcohol use: No   Drug use: No         OPHTHALMIC EXAM:  Base Eye Exam     Visual Acuity (Snellen - Linear)       Right Left   Dist cc CF at 3' 20/100 -2   Dist ph cc NI NI    Correction: Glasses         Tonometry (Tonopen, 7:55 AM)       Right Left   Pressure 22 24  Tonometry #2 (Tonopen, 7:55 AM)       Right Left   Pressure 21 19         Pupils       Dark Light Shape React APD   Right 4 3 Round Brisk None   Left 4 3 Round Brisk None         Visual Fields (Counting fingers)       Left Right    Full Full         Extraocular Movement       Right Left    Full, Ortho Full, Ortho         Neuro/Psych     Oriented x3: Yes   Mood/Affect: Normal         Dilation     Both eyes: 1.0% Mydriacyl, 2.5% Phenylephrine @ 7:55 AM           Slit Lamp and Fundus Exam     Slit Lamp Exam       Right Left   Lids/Lashes Dermatochalasis - upper lid Dermatochalasis - upper lid   Conjunctiva/Sclera 1+ Injection White and quiet   Cornea Arcus, 3+ fine Punctpate epithelial erosions Arcus, 3+ fine Punctate epithelial erosions, mild tear film debris   Anterior Chamber Deep and quiet Deep and quiet   Iris Round and well dilated Round and well dilated   Lens PCIOL in good position, 1+ Posterior capsular opacification PC IOL in good position with open PC   Vitreous Vitreous syneresis, Posterior vitreous detachment, vitreous condensations Vitreous syneresis, Posterior vitreous detachment, silicone oil micro drops         Fundus Exam       Right Left   Disc pink and sharp, compact pink and sharp, Compact, vascular loops superiorly   C/D Ratio 0.2 0.2   Macula Blunted foveal reflex, Drusen, Pigment clumping and atrophy, central thickening/pigmented disciform scar, +PED, no heme, persistent, trace cystic changes overlying central scar  Blunted foveal  reflex, central thickening, RPE clumping and atrophy, Drusen, RPE rip, trace cystic changes overlying PED -- stably improved, no heme   Vessels Vascular attenuation, Tortuous Vascular attenuation, Tortuous   Periphery Attached, mild Reticular degeneration Attached, mild Reticular degeneration            IMAGING AND PROCEDURES  Imaging and Procedures for 03/21/18  OCT, Retina - OU - Both Eyes       Right Eye Quality was good. Central Foveal Thickness: 545. Progression has been stable. Findings include pigment epithelial detachment, epiretinal membrane, subretinal hyper-reflective material, retinal drusen , abnormal foveal contour, no SRF, intraretinal fluid, disciform scar, outer retinal tubulation, outer retinal atrophy (Stable sub-retinal scar, mild persistent cystic changes overlying).   Left Eye Quality was good. Central Foveal Thickness: 349. Progression has been stable. Findings include subretinal hyper-reflective material, pigment epithelial detachment, intraretinal fluid, retinal drusen , outer retinal atrophy, normal foveal contour, no SRF (Mild persistent cystic changes overlying PED/SRHM).   Notes *Images captured and stored on drive  Diagnosis / Impression:  Exudative ARMD OU OD: Stable sub-retinal scar, mild persistent cystic changes overlying OS: Mild persistent cystic changes overlying PED/SRHM  Clinical management:  See below  Abbreviations: NFP - Normal foveal profile. CME - cystoid macular edema. PED - pigment epithelial detachment. IRF - intraretinal fluid. SRF - subretinal fluid. EZ - ellipsoid zone. ERM - epiretinal membrane. ORA - outer retinal atrophy. ORT - outer retinal tubulation. SRHM - subretinal hyper-reflective material      Intravitreal Injection, Pharmacologic  Agent - OS - Left Eye       Time Out 08/19/2021. 8:47 AM. Confirmed correct patient, procedure, site, and patient consented.   Anesthesia Topical anesthesia was used. Anesthetic  medications included Lidocaine 2%, Proparacaine 0.5%.   Procedure Preparation included 5% betadine to ocular surface, eyelid speculum. A (33g) needle was used.   Injection: 2 mg aflibercept 2 MG/0.05ML   Route: Intravitreal, Site: Left Eye   NDC: [id number], Lot: [id number], Expiration date: 06/03/2022, Waste: 0.05 mL   Post-op Post injection exam found visual acuity of at least counting fingers. The patient tolerated the procedure well. There were no complications. The patient received written and verbal post procedure care education. Post injection medications were not given.            ASSESSMENT/PLAN:    ICD-10-CM   1. Exudative age-related macular degeneration of both eyes with active choroidal neovascularization (HCC)  H35.3231 Intravitreal Injection, Pharmacologic Agent - OS - Left Eye    aflibercept (EYLEA) SOLN 2 mg    2. Retinal edema  H35.81 OCT, Retina - OU - Both Eyes    3. Posterior vitreous detachment of both eyes  H43.813     4. Pseudophakia of both eyes  Z96.1     5. Ocular hypertension, bilateral  H40.053     6. Dry eyes  H04.123      1,2. Exudative age related macular degeneration, both eyes.    - severe exudative disease with very active CNVM OU at presentation in January 2019  - S/P IVA OD #1 (01.04.19), #2 (02.15.19)  - S/P IVA OS #1 (01.18.19), #2 (02.15.19)  - switched to Eylea 3.18.19 due to severity of disease  - S/P IVE OD #1 (03.18.19), #2 (04.17.19), #3 (05.15.19), #4 (10.02.19),  #5 (01.15.20), #6 (06.04.20), #7 (10.29.20), #8 (01.21.21), #9 (05.27.21), #10 (07.22.21) -- injections held due to stable disciform scar  - S/P IVE OS #1 (03.18.19), #2 (04.17.19), #3 (05.15.19), #4 (06.12.19), #5 (07.10.19), #6 (08.07.19), #7 (09.04.19), #8 (10.02.19), #9 (11.06.19), #10 (12.11.19), #11 (01.15.20), #12 (02.20.20), #13 (03.26.20), #14 (04.30.20), #15 (06.04.20), #16 (07.16.20), # 17 (08.20.20), #18 (09.24.20), #19 (10.29.20), #20 (12.03.20), #21  (01.21.21), #22 (03.18.21), #23 (05.27.21), #24 (07.22.21), #25 (11.11.21), #26 (12.09.21), #27 (01.06.22), #28 (2.7.22), #29 (03.24.22), #30 (4.28.22), #31 (05.31.22), #32 (06.30.22), #33 (08.04.22)  - IVE OS held from 7.22.21 to 11.11.21 due to TIA/stroke on 8.1.21  - OCT OS: Mild persistent cystic changes overlying PED/SRHM  - exam OS shows macular heme stably resolved  - OD: Stable sub-retinal scar, mild persistent cystic changes overlying  - BCVA OD stable at CF 3'; OS improved to 20/100 from 20/150   - recommend IVE OS #34 today, 09.15.22 -- maintenance w/ 6 wk f/u - will cont to hold OD  - Eylea informed consent form signed and scanned on 01.21.2021  - Eylea4U paperwork filled out on 02.15.19 and fully approved through [employer] -- approved  - f/u in 6 weeks, sooner prn for DFE/OCT/possible injection(s)  3. PVD / vitreous syneresis  - Discussed findings and prognosis   - No RT or RD on 360 exam  - Reviewed s/s of RT/RD  - Strict return precautions for any such RT/RD signs/symptoms  4. Pseudophakia OU  - s/p CE/IOL (Dr. Marisa Hua, 11.2020)  - beautiful surgeries w/ IOLs in excellent position, doing well  - monitor   5. Ocular Hypertension OU  - IOP 20,19  - pt has not increased Cosopt to BID -- still using qd  -  inc Cosopt to BID OU  6. Dry eyes OU - recommend artificial tears and lubricating ointment as needed  Ophthalmic Meds Ordered this visit:  Meds ordered this encounter  Medications   aflibercept (EYLEA) SOLN 2 mg       Return in about 6 weeks (around 09/30/2021) for f/u exu ARMD OU, DFE, OCT.  There are no Patient Instructions on file for this visit.  This document serves as a record of services personally performed by Gardiner Sleeper, MD, PhD. It was created on their behalf by San Jetty. Owens Shark, OA an ophthalmic technician. The creation of this record is the provider's dictation and/or activities during the visit.    Electronically signed by: San Jetty. Marguerita Merles  09.13.2022 10:44 PM   Gardiner Sleeper, M.D., Ph.D. Diseases & Surgery of the Retina and Vitreous [facility]  I have reviewed the above documentation for accuracy and completeness, and I agree with the above. Gardiner Sleeper, M.D., Ph.D. 08/20/21 10:44 PM   Abbreviations: M myopia (nearsighted); A astigmatism; H hyperopia (farsighted); P presbyopia; Mrx spectacle prescription;  CTL contact lenses; OD right eye; OS left eye; OU both eyes  XT exotropia; ET esotropia; PEK punctate epithelial keratitis; PEE punctate epithelial erosions; DES dry eye syndrome; MGD meibomian gland dysfunction; ATs artificial tears; PFAT's preservative free artificial tears; [place] nuclear sclerotic cataract; PSC posterior subcapsular cataract; ERM epi-retinal membrane; PVD posterior vitreous detachment; RD retinal detachment; DM diabetes mellitus; DR diabetic retinopathy; NPDR non-proliferative diabetic retinopathy; PDR proliferative diabetic retinopathy; CSME clinically significant macular edema; DME diabetic macular edema; dbh dot blot hemorrhages; CWS cotton wool spot; POAG primary open angle glaucoma; C/D cup-to-disc ratio; HVF humphrey visual field; GVF goldmann visual field; OCT optical coherence tomography; IOP intraocular pressure; BRVO Branch retinal vein occlusion; CRVO central retinal vein occlusion; CRAO central retinal artery occlusion; BRAO branch retinal artery occlusion; RT retinal tear; SB scleral buckle; PPV pars plana vitrectomy; VH Vitreous hemorrhage; PRP panretinal laser photocoagulation; IVK intravitreal kenalog; VMT vitreomacular traction; MH Macular hole;  NVD neovascularization of the disc; NVE neovascularization elsewhere; AREDS age related eye disease study; ARMD age related macular degeneration; POAG primary open angle glaucoma; EBMD epithelial/anterior basement membrane dystrophy; ACIOL anterior chamber intraocular lens; IOL intraocular lens; PCIOL posterior chamber intraocular  lens; Phaco/IOL phacoemulsification with intraocular lens placement; [place] photorefractive keratectomy; LASIK laser assisted in situ keratomileusis; HTN hypertension; DM diabetes mellitus; COPD chronic obstructive pulmonary disease

## 2021-08-17 NOTE — Chronic Care Management (AMB) (Signed)
  Chronic Care Management   Note  08/17/2021 Name: Gabrielle [name] MRN: [id number] DOB: 21-Aug-1946  Gabrielle [name] is a 75 y.o. year old female who is a primary care patient of Janora Norlander, DO. Gabrielle [name] is currently enrolled in care management services. An additional referral for LCSW  was placed.   Follow up plan: Unsuccessful telephone outreach attempt made. A HIPAA compliant phone message was left for the patient providing contact information and requesting a return call.  The care management team will reach out to the patient again over the next 7 days.  If patient returns call to provider office, please advise to call [facility] . at [place], [place], [place] [facility]  [place], [place] [address] Direct Dial: [phone] [email or web address]: [email or web address]

## 2021-08-19 ENCOUNTER — Encounter (INDEPENDENT_AMBULATORY_CARE_PROVIDER_SITE_OTHER): Payer: Self-pay | Admitting: Ophthalmology

## 2021-08-19 ENCOUNTER — Ambulatory Visit (INDEPENDENT_AMBULATORY_CARE_PROVIDER_SITE_OTHER): Payer: Medicare HMO | Admitting: Ophthalmology

## 2021-08-19 ENCOUNTER — Telehealth: Payer: Medicare HMO | Admitting: *Deleted

## 2021-08-19 ENCOUNTER — Other Ambulatory Visit: Payer: Self-pay

## 2021-08-19 DIAGNOSIS — H40053 Ocular hypertension, bilateral: Secondary | ICD-10-CM

## 2021-08-19 DIAGNOSIS — H25813 Combined forms of age-related cataract, bilateral: Secondary | ICD-10-CM

## 2021-08-19 DIAGNOSIS — H3581 Retinal edema: Secondary | ICD-10-CM

## 2021-08-19 DIAGNOSIS — H43813 Vitreous degeneration, bilateral: Secondary | ICD-10-CM

## 2021-08-19 DIAGNOSIS — H353231 Exudative age-related macular degeneration, bilateral, with active choroidal neovascularization: Secondary | ICD-10-CM | POA: Diagnosis not present

## 2021-08-19 DIAGNOSIS — Z961 Presence of intraocular lens: Secondary | ICD-10-CM

## 2021-08-19 DIAGNOSIS — H04123 Dry eye syndrome of bilateral lacrimal glands: Secondary | ICD-10-CM | POA: Diagnosis not present

## 2021-08-20 ENCOUNTER — Encounter (INDEPENDENT_AMBULATORY_CARE_PROVIDER_SITE_OTHER): Payer: Self-pay | Admitting: Ophthalmology

## 2021-08-20 MED ORDER — AFLIBERCEPT 2MG/0.05ML IZ SOLN FOR KALEIDOSCOPE
2.0000 mg | INTRAVITREAL | Status: AC | PRN
  Administered 2021-08-19: 2 mg via INTRAVITREAL

## 2021-08-20 NOTE — Chronic Care Management (AMB) (Signed)
  Chronic Care Management   Note  08/20/2021 Name: Gabrielle [name] MRN: [id number] DOB: [birth year]  Gabrielle [name] is a 75 y.o. year old female who is a primary care patient of Janora Norlander, DO. Gabrielle [name] is currently enrolled in care management services. An additional referral for LCSW was placed.   Follow up plan: Unable to make contact on outreach attempts x 3. PCP Janora Norlander, DO notified via routed documentation in medical record.   Noreene Larsson, [place], [place],  [address] Direct Dial: [phone] [email or web address]: [email or web address]

## 2021-08-24 ENCOUNTER — Inpatient Hospital Stay (HOSPITAL_COMMUNITY): Payer: Medicare HMO

## 2021-08-24 ENCOUNTER — Inpatient Hospital Stay (HOSPITAL_COMMUNITY): Payer: Medicare HMO | Attending: Hematology

## 2021-09-13 ENCOUNTER — Other Ambulatory Visit (HOSPITAL_COMMUNITY): Payer: Self-pay | Admitting: *Deleted

## 2021-09-13 DIAGNOSIS — C189 Malignant neoplasm of colon, unspecified: Secondary | ICD-10-CM

## 2021-09-13 NOTE — Progress Notes (Signed)
Gabrielle [name], Gabrielle [name]   CLINIC:  Medical Oncology/Hematology  PCP:  Janora Norlander, DO [address] [place] [address] [phone]   REASON FOR VISIT:  Follow-up for right colon cancer  PRIOR THERAPY:  1. Right hemicolectomy on 04/08/2020. 2. FOLFOX x 3 cycles from 05/19/2020 to 07/14/2020.  NGS Results: Foundation 1 KRAS/NRAS wild-type, MSI--high, TMB 55 Muts/Mb, BRAF V 600 E  CURRENT THERAPY: Keytruda every 3 weeks  BRIEF ONCOLOGIC HISTORY:  Oncology History  Malignant neoplasm of colon ([place])  04/07/2020 Initial Diagnosis   Malignant neoplasm of ascending colon ([place])   05/13/2020 Genetic Testing   Foundation One     05/19/2020 - 07/02/2020 Chemotherapy   The patient had palonosetron (ALOXI) injection 0.25 mg, 0.25 mg, Intravenous,  Once, 2 of 12 cycles Administration: 0.25 mg (05/19/2020), 0.25 mg (06/30/2020) leucovorin 522 mg in dextrose 5 % 250 mL infusion, 320 mg/m2 = 522 mg (80 % of original dose 400 mg/m2), Intravenous,  Once, 2 of 12 cycles Dose modification: 320 mg/m2 (80 % of original dose 400 mg/m2, Cycle 1, Reason: Provider Judgment), 196.2229 mg/m2 (66.7 % of original dose 400 mg/m2, Cycle 2, Reason: Provider Judgment) Administration: 522 mg (05/19/2020), 434 mg (06/30/2020) oxaliplatin (ELOXATIN) 110 mg in dextrose 5 % 500 mL chemo infusion, 68 mg/m2 = 110 mg (80 % of original dose 85 mg/m2), Intravenous,  Once, 1 of 1 cycle Dose modification: 68 mg/m2 (80 % of original dose 85 mg/m2, Cycle 1, Reason: Provider Judgment) Administration: 110 mg (05/19/2020) fluorouracil (ADRUCIL) chemo injection 500 mg, 320 mg/m2 = 500 mg (80 % of original dose 400 mg/m2), Intravenous,  Once, 2 of 12 cycles Dose modification: 320 mg/m2 (80 % of original dose 400 mg/m2, Cycle 1, Reason: Provider Judgment), 798.9211 mg/m2 (66.7 % of original dose 400 mg/m2, Cycle 2, Reason: Provider Judgment) Administration: 500 mg (05/19/2020), 450  mg (06/30/2020) fluorouracil (ADRUCIL) 3,150 mg in sodium chloride 0.9 % 87 mL chemo infusion, 1,920 mg/m2 = 3,150 mg (80 % of original dose 2,400 mg/m2), Intravenous, 1 Day/Dose, 2 of 12 cycles Dose modification: 1,920 mg/m2 (80 % of original dose 2,400 mg/m2, Cycle 1, Reason: Provider Judgment), 1,600 mg/m2 (66.7 % of original dose 2,400 mg/m2, Cycle 2, Reason: Provider Judgment) Administration: 3,150 mg (05/19/2020), 2,600 mg (06/30/2020)   for chemotherapy treatment.     08/03/2020 Genetic Testing   Guardant Reveal Testing     08/14/2020 Genetic Testing   No pathogenic variants detected in [facility] Common Hereditary Cancers Panel.  Variant of uncertain significance (VUS) detected in HOXB13 at c.634G>A (p.Ala212Thr). The Common Hereditary Cancers Panel offered by [facility] includes sequencing and/or deletion duplication testing of the following 48 genes: APC, ATM, AXIN2, BARD1, BMPR1A, BRCA1, BRCA2, BRIP1, CDH1, CDK4, CDKN2A (p14ARF), CDKN2A (p16INK4a), CHEK2, CTNNA1, DICER1, EPCAM (Deletion/duplication testing only), FLCN, GREM1 (promoter region deletion/duplication testing only), KIT, MEN1, MLH1, MSH2, MSH3, MSH6, MUTYH, NBN, NF1, NHTL1, PALB2, PDGFRA, PMS2, POLD1, POLE, PTEN, RAD50, RAD51C, RAD51D, RNF43, SDHB, SDHC, SDHD, SMAD4, SMARCA4. STK11, TP53, TSC1, TSC2, and VHL.  The following genes were evaluated for sequence changes only: SDHA and HOXB13 c.251G>A variant only. The report date is August 14, 2020.    11/05/2020 Cancer Staging   Staging form: Colon and Rectum, AJCC 8th Edition - Pathologic stage from 11/05/2020: Stage IVC (rpTX, pN0, pM1c) - Signed by Derek Jack, MD on 11/05/2020   11/10/2020 -  Chemotherapy   Patient is on Treatment Plan : COLORECTAL  Pembrolizumab q21d       CANCER STAGING: Cancer Staging Malignant neoplasm of colon [facility]) Staging form: Colon and Rectum, AJCC 8th Edition - Clinical stage from 04/27/2020: Stage IIIB (cT4a, cN1a, cM0) - Unsigned -  Pathologic stage from 11/05/2020: Stage IVC (rpTX, pN0, pM1c) - Signed by Derek Jack, MD on 11/05/2020   INTERVAL HISTORY:  [name], a 75 y.o. female, returns for routine follow-up and consideration for next cycle of chemotherapy. Danylle was last seen on 08/03/2021.  Due for cycle #13 of Keytruda today.   Overall, she tells me she has been feeling pretty well. She reports excellent levels of energy, and she denies fatigue, new pains, skin rash, itching, and diarrhea. She reports occasional dry cough and SOB with exertion, and reports that she is drinking sufficient daily fluids. She take potassium BID, and she stopped taking Eliquis as she did not know the purpose of the medicine.    Overall, she feels ready for next cycle of chemo today.   REVIEW OF SYSTEMS:  Review of Systems  Constitutional:  Negative for appetite change and fatigue.  Respiratory:  Positive for cough (dry; occasional) and shortness of breath (occasional).   Gastrointestinal:  Negative for diarrhea.  Musculoskeletal:  Negative for arthralgias and myalgias.  Skin:  Negative for itching and rash.  All other systems reviewed and are negative.  PAST MEDICAL/SURGICAL HISTORY:  Past Medical History:  Diagnosis Date   Arthritis    Cancer [facility])    Cataract    OU   Colon cancer ([place])    Family history of pancreatic cancer 08/21/2020   Family history of uterine cancer 08/21/2020   H/O cesarean section    Hx of tonsillectomy    Hyperlipidemia    Hypertension    Macular degeneration    Exu ARMD OU   Paroxysmal atrial fibrillation ([place]) 07/05/2020   Past Surgical History:  Procedure Laterality Date   BIOPSY  04/06/2020   Procedure: BIOPSY;  Surgeon: Rogene Houston, MD;  Location: [facility];  Service: Endoscopy;;   CARPAL TUNNEL RELEASE Right    CATARACT EXTRACTION W/PHACO Left 10/11/2019   Procedure: CATARACT EXTRACTION PHACO AND INTRAOCULAR LENS PLACEMENT ([place]);  Surgeon: Baruch Goldmann, MD;   Location: [facility] ORS;  Service: Ophthalmology;  Laterality: Left;  CDE: 8.56   CATARACT EXTRACTION W/PHACO Right 10/25/2019   Procedure: CATARACT EXTRACTION PHACO AND INTRAOCULAR LENS PLACEMENT ([facility]);  Surgeon: Baruch Goldmann, MD;  Location: [facility] ORS;  Service: Ophthalmology;  Laterality: Right;  CDE: 5.67   CESAREAN SECTION     COLONOSCOPY N/A 04/06/2020   Procedure: COLONOSCOPY;  Surgeon: Rogene Houston, MD;  Location: [facility] SU[facility];  Service: Endoscopy;  Laterality: N/A;   CYSTOSCOPY WITH BIOPSY N/A 04/08/2020   Procedure: CYSTOSCOPY WITH BIOPSY;  Surgeon: Cleon Gustin, MD;  Location: [facility] ORS;  Service: Urology;  Laterality: N/A;   ESOPHAGOGASTRODUODENOSCOPY N/A 04/05/2020   Procedure: ESOPHAGOGASTRODUODENOSCOPY (EGD);  Surgeon: Rogene Houston, MD;  Location: [facility];  Service: Endoscopy;  Laterality: N/A;   PARTIAL COLECTOMY N/A 04/08/2020   Procedure: PARTIAL COLECTOMY;  Surgeon: Virl Cagey, MD;  Location: [facility] ORS;  Service: General;  Laterality: N/A;   PORTACATH PLACEMENT Left 05/18/2020   Procedure: INSERTION PORT-A-CATH (ATTACHED CATHETER IN LEFT SUBCLAVIAN);  Surgeon: Virl Cagey, MD;  Location: [facility] ORS;  Service: General;  Laterality: Left;   TONSILLECTOMY      SOCIAL HISTORY:  Social History   Socioeconomic History   Marital  status: Divorced    Spouse name: Not on file   Number of children: Not on file   Years of education: Not on file   Highest education level: Not on file  Occupational History   Not on file  Tobacco Use   Smoking status: Never   Smokeless tobacco: Never  Vaping Use   Vaping Use: Never used  Substance and Sexual Activity   Alcohol use: No   Drug use: No   Sexual activity: Not Currently  Other Topics Concern   Not on file  Social History Narrative   Not on file   Social Determinants of Health   Financial Resource Strain: Not on file  Food Insecurity: Not on file  Transportation Needs: Not on file  Physical Activity: Not on file   Stress: Not on file  Social Connections: Not on file  Intimate Partner Violence: Not on file    FAMILY HISTORY:  Family History  Problem Relation Age of Onset   Macular degeneration Mother    Stroke Mother    Hypertension Mother    Dementia Mother    Lung cancer Father        dx late 37s; smoking hx   Diabetes Sister    Hypertension Sister    Leukemia Paternal Uncle        d. 5s   Cancer Maternal Aunt        ovarian or endometrial dx 81s   Pancreatic cancer Paternal Uncle        d. late 45s    CURRENT MEDICATIONS:  Current Outpatient Medications  Medication Sig Dispense Refill   alendronate (FOSAMAX) 70 MG tablet Take 1 tablet (70 mg total) by mouth every 7 (seven) days. Take with a full glass of water on an empty stomach. 12 tablet 3   amLODipine (NORVASC) 5 MG tablet Take 1 tablet (5 mg total) by mouth daily. 90 tablet 3   atorvastatin (LIPITOR) 20 MG tablet Take 1 tablet (20 mg total) by mouth daily. 90 tablet 3   cholecalciferol (VITAMIN D3) 25 MCG (1000 UT) tablet Take 1,000 Units by mouth daily.     CRANBERRY FRUIT PO Take 1 tablet by mouth daily.     diclofenac Sodium (VOLTAREN) 1 % GEL APPLY 4 GRAMS TO AFFECTED AREA(S) 4 TIMES A DAY 400 g 2   dorzolamide-timolol (COSOPT) 22.3-6.8 MG/ML ophthalmic solution PLACE 1 DROP INTO BOTH EYES 2 TIMES A DAY (Patient taking differently: Place 1 drop into both eyes 1 day or 1 dose.) 10 mL 0   dorzolamide-timolol (COSOPT) 22.3-6.8 MG/ML ophthalmic solution Place 1 drop into both eyes 2 (two) times daily. 10 mL 3   ELIQUIS 5 MG TABS tablet Take 5 mg by mouth 2 (two) times daily.     FEROSUL 325 (65 Fe) MG tablet TAKE  (1)  TABLET TWICE A DAY WITH MEALS (BREAKFAST AND SUPPER) 037 tablet 0   Garlic 048 MG TABS Take by mouth.     lidocaine-prilocaine (EMLA) cream Apply a small amount to port a cath site and cover with plastic wrap 1 hour prior to chemotherapy appointments 30 g 0   losartan-hydrochlorothiazide (HYZAAR) 100-25 MG tablet  Take 1 tablet by mouth daily. 90 tablet 3   potassium chloride SA (KLOR-CON) 20 MEQ tablet Take 1 tablet (20 mEq total) by mouth 2 (two) times daily. 180 tablet 1   prochlorperazine (COMPAZINE) 10 MG tablet Take 1 tablet (10 mg total) by mouth every 6 (six) hours as needed for  nausea or vomiting. 30 tablet 0   No current facility-administered medications for this visit.    ALLERGIES:  Allergies  Allergen Reactions   Lisinopril Cough    PHYSICAL EXAM:  Performance status (ECOG): 1 - Symptomatic but completely ambulatory  There were no vitals filed for this visit. Wt Readings from Last 3 Encounters:  08/03/21 167 lb 9.6 oz (76 kg)  07/22/21 165 lb 3.2 oz (74.9 kg)  07/13/21 167 lb 9.6 oz (76 kg)   Physical Exam Vitals reviewed.  Constitutional:      Appearance: Normal appearance.  Cardiovascular:     Rate and Rhythm: Normal rate and regular rhythm.     Pulses: Normal pulses.     Heart sounds: Normal heart sounds.  Pulmonary:     Effort: Pulmonary effort is normal.     Breath sounds: Normal breath sounds.  Neurological:     General: No focal deficit present.     Mental Status: She is alert and oriented to person, place, and time.  Psychiatric:        Mood and Affect: Mood normal.        Behavior: Behavior normal.    LABORATORY DATA:  I have reviewed the labs as listed.  CBC Latest Ref Rng & Units 08/03/2021 07/13/2021 06/22/2021  WBC 4.0 - 10.5 K/uL 7.0 6.7 7.0  Hemoglobin 12.0 - 15.0 g/dL 13.3 12.7 13.3  Hematocrit 36.0 - 46.0 % 39.4 37.2 39.2  Platelets 150 - 400 K/uL 147(L) 143(L) 162   CMP Latest Ref Rng & Units 08/03/2021 07/13/2021 06/22/2021  Glucose 70 - 99 mg/dL 98 87 102(H)  BUN 8 - 23 mg/dL '20 20 20  ' Creatinine 0.44 - 1.00 mg/dL 1.04(H) 1.09(H) 1.26(H)  Sodium 135 - 145 mmol/L 134(L) 136 135  Potassium 3.5 - 5.1 mmol/L 3.2(L) 3.1(L) 3.3(L)  Chloride 98 - 111 mmol/L 97(L) 98 95(L)  CO2 22 - 32 mmol/L '29 30 31  ' Calcium 8.9 - 10.3 mg/dL 9.4 9.7 9.8  Total  Protein 6.5 - 8.1 g/dL 8.0 7.7 7.8  Total Bilirubin 0.3 - 1.2 mg/dL 0.5 0.7 0.5  Alkaline Phos 38 - 126 U/L 73 72 65  AST 15 - 41 U/L '21 22 20  ' ALT 0 - 44 U/L '18 17 17    ' DIAGNOSTIC IMAGING:  I have independently reviewed the scans and discussed with the patient. Intravitreal Injection, Pharmacologic Agent - OS - Left Eye  Result Date: 08/20/2021 Time Out 08/19/2021. 8:47 AM. Confirmed correct patient, procedure, site, and patient consented. Anesthesia Topical anesthesia was used. Anesthetic medications included Lidocaine 2%, Proparacaine 0.5%. Procedure Preparation included 5% betadine to ocular surface, eyelid speculum. A (33g) needle was used. Injection: 2 mg aflibercept 2 MG/0.05ML   Route: Intravitreal, Site: Left Eye   NDC: [id number], Lot: [id number], Expiration date: 06/03/2022, Waste: 0.05 mL Post-op Post injection exam found visual acuity of at least counting fingers. The patient tolerated the procedure well. There were no complications. The patient received written and verbal post procedure care education. Post injection medications were not given.   OCT, Retina - OU - Both Eyes  Result Date: 08/20/2021 Right Eye Quality was good. Central Foveal Thickness: 545. Progression has been stable. Findings include pigment epithelial detachment, epiretinal membrane, subretinal hyper-reflective material, retinal drusen , abnormal foveal contour, no SRF, intraretinal fluid, disciform scar, outer retinal tubulation, outer retinal atrophy (Stable sub-retinal scar, mild persistent cystic changes overlying). Left Eye Quality was good. Central Foveal Thickness: 349. Progression has been stable. Findings include  subretinal hyper-reflective material, pigment epithelial detachment, intraretinal fluid, retinal drusen , outer retinal atrophy, normal foveal contour, no SRF (Mild persistent cystic changes overlying PED/SRHM). Notes *Images captured and stored on drive Diagnosis / Impression: Exudative ARMD OU OD:  Stable sub-retinal scar, mild persistent cystic changes overlying OS: Mild persistent cystic changes overlying PED/SRHM Clinical management: See below Abbreviations: NFP - Normal foveal profile. CME - cystoid macular edema. PED - pigment epithelial detachment. IRF - intraretinal fluid. SRF - subretinal fluid. EZ - ellipsoid zone. ERM - epiretinal membrane. ORA - outer retinal atrophy. ORT - outer retinal tubulation. SRHM - subretinal hyper-reflective material     ASSESSMENT:  1.  Stage IIIb (T4AN1A) poorly differentiated right colon adenocarcinoma: -Right hemicolectomy on 04/07/2020 with poorly differentiated adenocarcinoma, pT4a, positive radial margin, 1/15 lymph nodes positive, loss of MLH1 and PMS2, bladder biopsy negative for malignancy. -CTAP on 04/06/2020 showed circumferential ascending colon mass with numerous borderline enlarged pericolonic lymph nodes.  No findings of hepatic metastatic disease. -CEA on 04/04/2020 was 12.2.  CEA improved to 1.7 on 05/13/2020. -PET scan on 05/11/2020 shows mild FDG uptake associated with the anastomotic site.  Small pulmonary nodules that do not show elevated FDG activity, remain nonspecific, subcentimeter.  Persistent but improved bladder wall thickening. -Cycle 1 of dose reduced FOLFOX on 05/19/2020, followed by 2 hospitalizations, 1 from acute kidney injury from diarrhea and second admission for C. difficile colitis. -Cycle 2 of 5-FU and leucovorin dose reduced on 06/30/2020.  Chemotherapy discontinued secondary to intolerance. -Follow-up CT scan on 11/02/2020 with multiple soft tissue lesions in the central and right mesentery measuring up to 3.1 x 1.7 cm.  Mild lymphadenopathy in the hepatic duodenal ligament, retroperitoneal space, right common iliac chain, bilateral external iliac chains.  17 mm soft tissue nodule in the lower anterior abdominal wall.  Bilateral tiny pulmonary nodules not substantially changed.  2.9 x 2.5 cm low-density lesion along the posterior  uterus is indeterminate. -Pembrolizumab started on 11/10/2020. -CT CAP from 02/09/2021 showed interval near complete resolution of previously demonstrated nodal mass in the ileocolonic mesentery.  Residual ill-defined nodule medial to the tip of the right hepatic lobe.  Stable small pulmonary nodules bilaterally consistent with benign findings.   2.  Family history: -Paternal uncle had colon cancer.  Maternal aunt had brain cancer and another maternal aunt had gynecological malignancy.  Father had lung cancer and was a smoker. - Genetic testing did not reveal any notable mutations.   3.  Diffuse erythema and nodularity of the dome of the bladder: -CT scan showed severely thickened bladder wall. -Cystoscopy on 04/08/2020 showed diffuse erythema, nodularity in the posterior wall tracking to the dome.  Ureteral orifices were in normal locations.  Biopsies were benign.   PLAN:  1.  Stage IV right colon adenocarcinoma, MSI-high: - CT scan on 07/30/2021 showed postoperative changes in the right hemicolectomy with no change in the triangular soft tissue lesion interposed between the right hemicolectomy site and top of liver.  No evidence of new metastatic disease.  Multiple small pulmonary nodules are stable. - Reviewed labs from today which showed normal LFTs.  Creatinine is mildly elevated at 1.06 and stable.  CBC shows normal hemoglobin and white count with mildly low platelet count 138.  Last CEA was 1.8. - She does not report any immunotherapy related side effects.  She has slight dyspnea on exertion and very occasional dry cough.  No diarrhea. - Proceed with treatment today. - RTC 3 weeks for follow-up.  We will  plan to repeat CT scan prior to next visit.   2.  Paroxysmal atrial fibrillation: - She stopped taking Eliquis. - We have sent another prescription refill.  She was told to start back on Eliquis twice daily.   3.  Hypertension: - Continue Hyzaar and amlodipine.  Blood pressure is well  controlled.  4.  Hypokalemia: - Continue potassium 40 mEq daily.  Potassium today is normal at 3.8.   Orders placed this encounter:  No orders of the defined types were placed in this encounter.    Derek Jack, MD [place] [phone]   I, Thana Ates, am acting as a scribe for Dr. Derek Jack.  I, Derek Jack MD, have reviewed the above documentation for accuracy and completeness, and I agree with the above.

## 2021-09-14 ENCOUNTER — Inpatient Hospital Stay (HOSPITAL_BASED_OUTPATIENT_CLINIC_OR_DEPARTMENT_OTHER): Payer: Medicare HMO | Admitting: Hematology

## 2021-09-14 ENCOUNTER — Other Ambulatory Visit: Payer: Self-pay

## 2021-09-14 ENCOUNTER — Inpatient Hospital Stay (HOSPITAL_COMMUNITY): Payer: Medicare HMO

## 2021-09-14 ENCOUNTER — Inpatient Hospital Stay (HOSPITAL_COMMUNITY): Payer: Medicare HMO | Attending: Hematology

## 2021-09-14 VITALS — BP 154/74 | HR 68 | Temp 97.3°F | Resp 18

## 2021-09-14 VITALS — BP 150/76 | HR 90 | Temp 97.1°F | Resp 19 | Wt 168.8 lb

## 2021-09-14 DIAGNOSIS — E876 Hypokalemia: Secondary | ICD-10-CM | POA: Diagnosis not present

## 2021-09-14 DIAGNOSIS — Z5112 Encounter for antineoplastic immunotherapy: Secondary | ICD-10-CM | POA: Insufficient documentation

## 2021-09-14 DIAGNOSIS — C182 Malignant neoplasm of ascending colon: Secondary | ICD-10-CM | POA: Insufficient documentation

## 2021-09-14 DIAGNOSIS — Z79899 Other long term (current) drug therapy: Secondary | ICD-10-CM | POA: Insufficient documentation

## 2021-09-14 DIAGNOSIS — C189 Malignant neoplasm of colon, unspecified: Secondary | ICD-10-CM | POA: Diagnosis not present

## 2021-09-14 LAB — CBC WITH DIFFERENTIAL/PLATELET
Abs Immature Granulocytes: 0.03 10*3/uL (ref 0.00–0.07)
Basophils Absolute: 0 10*3/uL (ref 0.0–0.1)
Basophils Relative: 1 %
Eosinophils Absolute: 0.3 10*3/uL (ref 0.0–0.5)
Eosinophils Relative: 5 %
HCT: 38.9 % (ref 36.0–46.0)
Hemoglobin: 13.1 g/dL (ref 12.0–15.0)
Immature Granulocytes: 1 %
Lymphocytes Relative: 20 %
Lymphs Abs: 1.3 10*3/uL (ref 0.7–4.0)
MCH: 32.3 pg (ref 26.0–34.0)
MCHC: 33.7 g/dL (ref 30.0–36.0)
MCV: 95.8 fL (ref 80.0–100.0)
Monocytes Absolute: 0.6 10*3/uL (ref 0.1–1.0)
Monocytes Relative: 9 %
Neutro Abs: 4.1 10*3/uL (ref 1.7–7.7)
Neutrophils Relative %: 64 %
Platelets: 138 10*3/uL — ABNORMAL LOW (ref 150–400)
RBC: 4.06 MIL/uL (ref 3.87–5.11)
RDW: 13.3 % (ref 11.5–15.5)
WBC: 6.4 10*3/uL (ref 4.0–10.5)
nRBC: 0 % (ref 0.0–0.2)

## 2021-09-14 LAB — COMPREHENSIVE METABOLIC PANEL
ALT: 20 U/L (ref 0–44)
AST: 25 U/L (ref 15–41)
Albumin: 4.2 g/dL (ref 3.5–5.0)
Alkaline Phosphatase: 72 U/L (ref 38–126)
Anion gap: 8 (ref 5–15)
BUN: 20 mg/dL (ref 8–23)
CO2: 31 mmol/L (ref 22–32)
Calcium: 9.7 mg/dL (ref 8.9–10.3)
Chloride: 97 mmol/L — ABNORMAL LOW (ref 98–111)
Creatinine, Ser: 1.06 mg/dL — ABNORMAL HIGH (ref 0.44–1.00)
GFR, Estimated: 55 mL/min — ABNORMAL LOW (ref >60)
Glucose, Bld: 109 mg/dL — ABNORMAL HIGH (ref 70–99)
Potassium: 3.8 mmol/L (ref 3.5–5.1)
Sodium: 136 mmol/L (ref 135–145)
Total Bilirubin: 0.7 mg/dL (ref 0.3–1.2)
Total Protein: 7.6 g/dL (ref 6.5–8.1)

## 2021-09-14 LAB — TSH: TSH: 1.852 u[IU]/mL (ref 0.350–4.500)

## 2021-09-14 MED ORDER — SODIUM CHLORIDE 0.9 % IV SOLN: Freq: Once | INTRAVENOUS | Status: AC

## 2021-09-14 MED ORDER — ELIQUIS 5 MG PO TABS: 5.0000 mg | ORAL_TABLET | Freq: Two times a day (BID) | ORAL | 6 refills | Status: DC

## 2021-09-14 MED ORDER — SODIUM CHLORIDE 0.9 % IV SOLN
200.0000 mg | Freq: Once | INTRAVENOUS | Status: AC
  Administered 2021-09-14: 200 mg via INTRAVENOUS
  Filled 2021-09-14: qty 8

## 2021-09-14 MED ORDER — HEPARIN SOD (PORK) LOCK FLUSH 100 UNIT/ML IV SOLN
500.0000 [IU] | Freq: Once | INTRAVENOUS | Status: AC | PRN
  Administered 2021-09-14: 500 [IU]

## 2021-09-14 MED ORDER — SODIUM CHLORIDE 0.9% FLUSH
10.0000 mL | INTRAVENOUS | Status: DC | PRN
  Administered 2021-09-14: 10 mL

## 2021-09-14 NOTE — Progress Notes (Signed)
Pt presents today for Keytruda per provider's order. Vital signs and labs WNL for treatment today. Okay to proceed with treatment per Dr. Esmond Harps given today per MD orders. Tolerated infusion without adverse affects. Vital signs stable. No complaints at this time. Discharged from clinic ambulatory in stable condition. Alert and oriented x 3. F/U with [facility] as scheduled.

## 2021-09-14 NOTE — Progress Notes (Signed)
Patient has been examined, vital signs and labs have been reviewed by Dr. Katragadda. ANC, Creatinine, LFTs, hemoglobin, and platelets are within treatment parameters per Dr. Katragadda. Patient may proceed with treatment per M.D.   

## 2021-09-14 NOTE — Patient Instructions (Signed)
[place] at [facility] Discharge Instructions  You were seen and examined by Dr. Delton Coombes today.  Keytruda infusion today and in 3 weeks. CT scan prior to next visit. Office visit and treatment in 3 weeks.   Thank you for choosing [place] at [facility] to provide your oncology and hematology care.  To afford each patient quality time with our provider, please arrive at least 15 minutes before your scheduled appointment time.   If you have a lab appointment with the [facility] please come in thru the Main Entrance and check in at the main information desk.  You need to re-schedule your appointment should you arrive 10 or more minutes late.  We strive to give you quality time with our providers, and arriving late affects you and other patients whose appointments are after yours.  Also, if you no show three or more times for appointments you may be dismissed from the clinic at the providers discretion.     Again, thank you for choosing [facility].  Our hope is that these requests will decrease the amount of time that you wait before being seen by our physicians.       _____________________________________________________________  Should you have questions after your visit to [facility], please contact our office at [phone] and follow the prompts.  Our office hours are 8:00 a.m. and 4:30 p.m. Monday - Friday.  Please note that voicemails left after 4:00 p.m. may not be returned until the following business day.  We are closed weekends and major holidays.  You do have access to a nurse 24-7, just call the main number to the clinic [phone] and do not press any options, hold on the line and a nurse will answer the phone.    For prescription refill requests, have your pharmacy contact our office and allow 72 hours.    Due to Covid, you will need to wear a mask upon entering the hospital. If you do not have  a mask, a mask will be given to you at the Main Entrance upon arrival. For doctor visits, patients may have 1 support person age 72 or older with them. For treatment visits, patients can not have anyone with them due to social distancing guidelines and our immunocompromised population.

## 2021-09-14 NOTE — Patient Instructions (Signed)
[facility]  Discharge Instructions: Thank you for choosing [facility] to provide your oncology and hematology care.  If you have a lab appointment with the Cancer Center, please come in thru the Main Entrance and check in at the main information desk.  Wear comfortable clothing and clothing appropriate for easy access to any Portacath or PICC line.   We strive to give you quality time with your provider. You may need to reschedule your appointment if you arrive late (15 or more minutes).  Arriving late affects you and other patients whose appointments are after yours.  Also, if you miss three or more appointments without notifying the office, you may be dismissed from the clinic at the provider's discretion.      For prescription refill requests, have your pharmacy contact our office and allow 72 hours for refills to be completed.    Today you received the following chemotherapy and/or immunotherapy agents Keytruda       To help prevent nausea and vomiting after your treatment, we encourage you to take your nausea medication as directed.  BELOW ARE SYMPTOMS THAT SHOULD BE REPORTED IMMEDIATELY: *FEVER GREATER THAN 100.4 F (38 C) OR HIGHER *CHILLS OR SWEATING *NAUSEA AND VOMITING THAT IS NOT CONTROLLED WITH YOUR NAUSEA MEDICATION *UNUSUAL SHORTNESS OF BREATH *UNUSUAL BRUISING OR BLEEDING *URINARY PROBLEMS (pain or burning when urinating, or frequent urination) *BOWEL PROBLEMS (unusual diarrhea, constipation, pain near the anus) TENDERNESS IN MOUTH AND THROAT WITH OR WITHOUT PRESENCE OF ULCERS (sore throat, sores in mouth, or a toothache) UNUSUAL RASH, SWELLING OR PAIN  UNUSUAL VAGINAL DISCHARGE OR ITCHING   Items with * indicate a potential emergency and should be followed up as soon as possible or go to the Emergency Department if any problems should occur.  Please show the CHEMOTHERAPY ALERT CARD or IMMUNOTHERAPY ALERT CARD at check-in to the Emergency  Department and triage nurse.  Should you have questions after your visit or need to cancel or reschedule your appointment, please contact [facility] [phone]  and follow the prompts.  Office hours are 8:00 a.m. to 4:30 p.m. Monday - Friday. Please note that voicemails left after 4:00 p.m. may not be returned until the following business day.  We are closed weekends and major holidays. You have access to a nurse at all times for urgent questions. Please call the main number to the clinic [phone] and follow the prompts.  For any non-urgent questions, you may also contact your provider using MyChart. We now offer e-Visits for anyone 18 and older to request care online for non-urgent symptoms. For details visit [email or web address].   Also download the MyChart app! Go to the app store, search "MyChart", open the app, select [place], and log in with your MyChart username and password.  Due to Covid, a mask is required upon entering the hospital/clinic. If you do not have a mask, one will be given to you upon arrival. For doctor visits, patients may have 1 support person aged 18 or older with them. For treatment visits, patients cannot have anyone with them due to current Covid guidelines and our immunocompromised population.  

## 2021-09-15 LAB — CEA: CEA: 2.2 ng/mL (ref 0.0–4.7)

## 2021-09-27 NOTE — Progress Notes (Signed)
[facility] Note  09/30/2021     CHIEF COMPLAINT Patient presents for Retina Follow Up   HISTORY OF PRESENT ILLNESS: Gabrielle [name] is a 75 y.o. female who presents to the clinic today for:   HPI     Retina Follow Up   Patient presents with  Wet AMD.  In both eyes.  This started 6 weeks ago.  I, the attending physician,  performed the HPI with the patient and updated documentation appropriately.        Comments   Patient here for 6 weeks retina follow up for exu ARMD OU. Patient states vision about the same. AM is the best. By afternoon gets a little blurry.      Last edited by Bernarda Caffey, MD on 09/30/2021 11:29 PM.    Pt states vision is stable  Referring physician: Janora Norlander, DO [place],  [place] [id number]  HISTORICAL INFORMATION:   Selected notes from the MEDICAL RECORD NUMBER Referred by Dr. Cristela Blue for concern of ARMD OU   CURRENT MEDICATIONS: Current Outpatient Medications (Ophthalmic Drugs)  Medication Sig   dorzolamide-timolol (COSOPT) 22.3-6.8 MG/ML ophthalmic solution PLACE 1 DROP INTO BOTH EYES 2 TIMES A DAY (Patient taking differently: Place 1 drop into both eyes 1 day or 1 dose.)   dorzolamide-timolol (COSOPT) 22.3-6.8 MG/ML ophthalmic solution Place 1 drop into both eyes 2 (two) times daily.   No current facility-administered medications for this visit. (Ophthalmic Drugs)   Current Outpatient Medications (Other)  Medication Sig   alendronate (FOSAMAX) 70 MG tablet Take 1 tablet (70 mg total) by mouth every 7 (seven) days. Take with a full glass of water on an empty stomach.   amLODipine (NORVASC) 5 MG tablet Take 1 tablet (5 mg total) by mouth daily.   atorvastatin (LIPITOR) 20 MG tablet Take 1 tablet (20 mg total) by mouth daily.   cholecalciferol (VITAMIN D3) 25 MCG (1000 UT) tablet Take 1,000 Units by mouth daily.   CRANBERRY FRUIT PO Take 1 tablet by mouth daily.   diclofenac Sodium (VOLTAREN)  1 % GEL APPLY 4 GRAMS TO AFFECTED AREA(S) 4 TIMES A DAY   ELIQUIS 5 MG TABS tablet Take 1 tablet (5 mg total) by mouth 2 (two) times daily.   FEROSUL 325 (65 Fe) MG tablet TAKE  (1)  TABLET TWICE A DAY WITH MEALS (BREAKFAST AND SUPPER)   Garlic 683 MG TABS Take by mouth.   lidocaine-prilocaine (EMLA) cream Apply a small amount to port a cath site and cover with plastic wrap 1 hour prior to chemotherapy appointments   losartan-hydrochlorothiazide (HYZAAR) 100-25 MG tablet Take 1 tablet by mouth daily.   potassium chloride SA (KLOR-CON) 20 MEQ tablet Take 1 tablet (20 mEq total) by mouth 2 (two) times daily.   prochlorperazine (COMPAZINE) 10 MG tablet Take 1 tablet (10 mg total) by mouth every 6 (six) hours as needed for nausea or vomiting.   No current facility-administered medications for this visit. (Other)   REVIEW OF SYSTEMS: ROS   Positive for: Gastrointestinal, Genitourinary, Musculoskeletal, Cardiovascular, Eyes Negative for: Constitutional, Neurological, Skin, HENT, Endocrine, Respiratory, Psychiatric, Allergic/Imm, Heme/Lymph Last edited by Theodore Demark, COA on 09/30/2021  8:02 AM.     ALLERGIES Allergies  Allergen Reactions   Lisinopril Cough    PAST MEDICAL HISTORY Past Medical History:  Diagnosis Date   Arthritis    Cancer ([place])    Cataract    OU  Colon cancer [facility])    Family history of pancreatic cancer 08/21/2020   Family history of uterine cancer 08/21/2020   H/O cesarean section    Hx of tonsillectomy    Hyperlipidemia    Hypertension    Macular degeneration    Exu ARMD OU   Paroxysmal atrial fibrillation ([place]) 07/05/2020   Past Surgical History:  Procedure Laterality Date   BIOPSY  04/06/2020   Procedure: BIOPSY;  Surgeon: Rogene Houston, MD;  [facility];  Service: Endoscopy;;   CARPAL TUNNEL RELEASE Right    CATARACT EXTRACTION W/PHACO Left 10/11/2019   Procedure: CATARACT EXTRACTION PHACO AND INTRAOCULAR LENS PLACEMENT ([place]);  Surgeon:  Baruch Goldmann, MD;  Location: [facility];  Service: Ophthalmology;  Laterality: Left;  CDE: 8.56   CATARACT EXTRACTION W/PHACO Right 10/25/2019   Procedure: CATARACT EXTRACTION PHACO AND INTRAOCULAR LENS PLACEMENT (IOC);  Surgeon: Baruch Goldmann, MD;  Location: [facility];  Service: Ophthalmology;  Laterality: Right;  CDE: 5.67   CESAREAN SECTION     COLONOSCOPY N/A 04/06/2020   Procedure: COLONOSCOPY;  Surgeon: Rogene Houston, MD;  [facility];  Service: Endoscopy;  Laterality: N/A;   CYSTOSCOPY WITH BIOPSY N/A 04/08/2020   Procedure: CYSTOSCOPY WITH BIOPSY;  Surgeon: Cleon Gustin, MD;  Location: [facility];  Service: Urology;  Laterality: N/A;   ESOPHAGOGASTRODUODENOSCOPY N/A 04/05/2020   Procedure: ESOPHAGOGASTRODUODENOSCOPY (EGD);  Surgeon: Rogene Houston, MD;  [facility];  Service: Endoscopy;  Laterality: N/A;   PARTIAL COLECTOMY N/A 04/08/2020   Procedure: PARTIAL COLECTOMY;  Surgeon: Virl Cagey, MD;  Location: [facility];  Service: General;  Laterality: N/A;   PORTACATH PLACEMENT Left 05/18/2020   Procedure: INSERTION PORT-A-CATH (ATTACHED CATHETER IN LEFT SUBCLAVIAN);  Surgeon: Virl Cagey, MD;  Location: [facility];  Service: General;  Laterality: Left;   TONSILLECTOMY      FAMILY HISTORY Family History  Problem Relation Age of Onset   Macular degeneration Mother    Stroke Mother    Hypertension Mother    Dementia Mother    Lung cancer Father        dx late 69s; smoking hx   Diabetes Sister    Hypertension Sister    Leukemia Paternal Uncle        d. 30s   Cancer Maternal Aunt        ovarian or endometrial dx 54s   Pancreatic cancer Paternal Uncle        d. late 83s    SOCIAL HISTORY Social History   Tobacco Use   Smoking status: Never   Smokeless tobacco: Never  Vaping Use   Vaping Use: Never used  Substance Use Topics   Alcohol use: No   Drug use: No       OPHTHALMIC EXAM: Base Eye Exam     Visual Acuity (Snellen - Linear)        Right Left   Dist cc 20/800 20/80 -2   Dist ph cc NI NI    Correction: Glasses         Tonometry (Tonopen, 8:00 AM)       Right Left   Pressure 19 17         Pupils       Dark Light Shape React APD   Right 4 3 Round Brisk None   Left 4 3 Round Brisk None         Visual Fields (Counting fingers)  Left Right    Full Full         Extraocular Movement       Right Left    Full, Ortho Full, Ortho         Neuro/Psych     Oriented x3: Yes   Mood/Affect: Normal         Dilation     Both eyes: 1.0% Mydriacyl, 2.5% Phenylephrine @ 8:00 AM           Slit Lamp and Fundus Exam     Slit Lamp Exam       Right Left   Lids/Lashes Dermatochalasis - upper lid Dermatochalasis - upper lid   Conjunctiva/Sclera 1+ Injection White and quiet   Cornea Arcus, 3+ fine Punctpate epithelial erosions Arcus, 3+ fine Punctate epithelial erosions, mild tear film debris   Anterior Chamber Deep and quiet Deep and quiet   Iris Round and well dilated Round and well dilated   Lens PCIOL in good position, 1+ Posterior capsular opacification PC IOL in good position with open PC   Vitreous Vitreous syneresis, Posterior vitreous detachment, vitreous condensations Vitreous syneresis, Posterior vitreous detachment, silicone oil micro drops         Fundus Exam       Right Left   Disc pink and sharp, compact mild pallor, sharp, Compact, vascular loops superiorly   C/D Ratio 0.2 0.2   Macula Blunted foveal reflex, Drusen, Pigment clumping and atrophy, central thickening/pigmented disciform scar, +PED, no heme, persistent, trace cystic changes overlying central scar  Blunted foveal reflex, central thickening, RPE clumping and atrophy, Drusen, RPE rip, trace cystic changes overlying PED -- stably improved, no heme   Vessels Vascular attenuation, Tortuous Vascular attenuation, Tortuous   Periphery Attached, mild Reticular degeneration Attached, mild Reticular degeneration            Refraction     Wearing Rx       Sphere Cylinder Add   Right [place] Sphere +3.50   Left -0.50 Sphere +3.50    Type: PAL            IMAGING AND PROCEDURES  Imaging and Procedures for 03/21/18  OCT, Retina - OU - Both Eyes       Right Eye Quality was good. Central Foveal Thickness: 531. Progression has been stable. Findings include pigment epithelial detachment, epiretinal membrane, subretinal hyper-reflective material, retinal drusen , abnormal foveal contour, no SRF, intraretinal fluid, disciform scar, outer retinal tubulation, outer retinal atrophy (Stable sub-retinal scar, mild persistent cystic changes overlying).   Left Eye Quality was good. Central Foveal Thickness: 304. Progression has been stable. Findings include subretinal hyper-reflective material, pigment epithelial detachment, intraretinal fluid, retinal drusen , outer retinal atrophy, normal foveal contour, no SRF (persistent cystic changes overlying PED/SRHM).   Notes *Images captured and stored on drive  Diagnosis / Impression:  Exudative ARMD OU OD: Stable sub-retinal scar, mild persistent cystic changes overlying OS: persistent cystic changes overlying PED/SRHM  Clinical management:  See below  Abbreviations: NFP - Normal foveal profile. CME - cystoid macular edema. PED - pigment epithelial detachment. IRF - intraretinal fluid. SRF - subretinal fluid. EZ - ellipsoid zone. ERM - epiretinal membrane. ORA - outer retinal atrophy. ORT - outer retinal tubulation. SRHM - subretinal hyper-reflective material      Intravitreal Injection, Pharmacologic Agent - OS - Left Eye       Time Out 09/30/2021. 8:20 AM. Confirmed correct patient, procedure, site, and patient consented.   Anesthesia Topical anesthesia was  used. Anesthetic medications included Lidocaine 2%, Proparacaine 0.5%.   Procedure Preparation included 5% betadine to ocular surface, eyelid speculum. A (32g) needle was used.    Injection: 2 mg aflibercept 2 MG/0.05ML   Route: Intravitreal, Site: Left Eye   NDC: [id number], Lot: [id number], Expiration date: 08/04/2022, Waste: 0.05 mL   Post-op Post injection exam found visual acuity of at least counting fingers. The patient tolerated the procedure well. There were no complications. The patient received written and verbal post procedure care education. Post injection medications were not given.             ASSESSMENT/PLAN:    ICD-10-CM   1. Exudative age-related macular degeneration of both eyes with active choroidal neovascularization (HCC)  H35.3231 Intravitreal Injection, Pharmacologic Agent - OS - Left Eye    aflibercept (EYLEA) SOLN 2 mg    2. Retinal edema  H35.81 OCT, Retina - OU - Both Eyes    3. Posterior vitreous detachment of both eyes  H43.813     4. Pseudophakia of both eyes  Z96.1     5. Ocular hypertension, bilateral  H40.053     6. Dry eyes  H04.123      1,2. Exudative age related macular degeneration, both eyes.    - severe exudative disease with very active CNVM OU at presentation in January 2019  - S/P IVA OD #1 (01.04.19), #2 (02.15.19)  - S/P IVA OS #1 (01.18.19), #2 (02.15.19)  - switched to Eylea 3.18.19 due to severity of disease  - S/P IVE OD #1 (03.18.19), #2 (04.17.19), #3 (05.15.19), #4 (10.02.19),  #5 (01.15.20), #6 (06.04.20), #7 (10.29.20), #8 (01.21.21), #9 (05.27.21), #10 (07.22.21) -- injections held due to stable disciform scar  - S/P IVE OS #1 (03.18.19), #2 (04.17.19), #3 (05.15.19), #4 (06.12.19), #5 (07.10.19), #6 (08.07.19), #7 (09.04.19), #8 (10.02.19), #9 (11.06.19), #10 (12.11.19), #11 (01.15.20), #12 (02.20.20), #13 (03.26.20), #14 (04.30.20), #15 (06.04.20), #16 (07.16.20), # 17 (08.20.20), #18 (09.24.20), #19 (10.29.20), #20 (12.03.20), #21 (01.21.21), #22 (03.18.21), #23 (05.27.21), #24 (07.22.21), #25 (11.11.21), #26 (12.09.21), #27 (01.06.22), #28 (2.7.22), #29 (03.24.22), #30 (4.28.22), #31  (05.31.22), #32 (06.30.22), #33 (08.04.22), #34 (09.15.22)  - IVE OS held from 7.22.21 to 11.11.21 due to TIA/stroke on 8.1.21  - OCT OS: Mild persistent cystic changes overlying PED/SRHM  - exam OS shows macular heme stably resolved  - OD: Stable sub-retinal scar, mild persistent cystic changes overlying  - BCVA OD stable at CF 3'; OS improved to 20/100 from 20/150   - recommend IVE OS #35 today, 10.27.22 -- maintenance w/ 6 wk f/u - will cont to hold OD  - Eylea informed consent form signed and scanned on 01.21.2021  - Eylea4U paperwork filled out on 02.15.19 and fully approved through [employer] -- approved  - f/u in 6 weeks, sooner prn for DFE/OCT/possible injection(s)  3. PVD / vitreous syneresis  - Discussed findings and prognosis   - No RT or RD on 360 exam  - Reviewed s/s of RT/RD  - Strict return precautions for any such RT/RD signs/symptoms  4. Pseudophakia OU  - s/p CE/IOL (Dr. Marisa Hua, 11.2020)  - beautiful surgeries w/ IOLs in excellent position, doing well  - monitor   5. Ocular Hypertension OU  - IOP 20,19  - pt has not increased Cosopt to BID -- still using qd  - inc Cosopt to BID OU  6. Dry eyes OU - recommend artificial tears and lubricating ointment as needed  Ophthalmic Meds Ordered this visit:  Meds ordered this encounter  Medications   aflibercept (EYLEA) SOLN 2 mg     Return in about 6 weeks (around 11/11/2021) for f/u exu ARMD OS, DFE, OCT.  There are no Patient Instructions on file for this visit.  This document serves as a record of services personally performed by Gardiner Sleeper, MD, PhD. It was created on their behalf by San Jetty. Owens Shark, OA an ophthalmic technician. The creation of this record is the provider's dictation and/or activities during the visit.    Electronically signed by: San Jetty. Owens Shark, [place] 10.24.2022 11:32 PM  Gardiner Sleeper, M.D., Ph.D. Diseases & Surgery of the Retina and Vitreous [facility]  I have  reviewed the above documentation for accuracy and completeness, and I agree with the above. Gardiner Sleeper, M.D., Ph.D. 09/30/21 11:34 PM   Abbreviations: M myopia (nearsighted); A astigmatism; H hyperopia (farsighted); P presbyopia; Mrx spectacle prescription;  CTL contact lenses; OD right eye; OS left eye; OU both eyes  XT exotropia; ET esotropia; PEK punctate epithelial keratitis; PEE punctate epithelial erosions; DES dry eye syndrome; MGD meibomian gland dysfunction; ATs artificial tears; PFAT's preservative free artificial tears; [place] nuclear sclerotic cataract; PSC posterior subcapsular cataract; ERM epi-retinal membrane; PVD posterior vitreous detachment; RD retinal detachment; DM diabetes mellitus; DR diabetic retinopathy; NPDR non-proliferative diabetic retinopathy; PDR proliferative diabetic retinopathy; CSME clinically significant macular edema; DME diabetic macular edema; dbh dot blot hemorrhages; CWS cotton wool spot; POAG primary open angle glaucoma; C/D cup-to-disc ratio; HVF humphrey visual field; GVF goldmann visual field; OCT optical coherence tomography; IOP intraocular pressure; BRVO Branch retinal vein occlusion; CRVO central retinal vein occlusion; CRAO central retinal artery occlusion; BRAO branch retinal artery occlusion; RT retinal tear; SB scleral buckle; PPV pars plana vitrectomy; VH Vitreous hemorrhage; PRP panretinal laser photocoagulation; IVK intravitreal kenalog; VMT vitreomacular traction; MH Macular hole;  NVD neovascularization of the disc; NVE neovascularization elsewhere; AREDS age related eye disease study; ARMD age related macular degeneration; POAG primary open angle glaucoma; EBMD epithelial/anterior basement membrane dystrophy; ACIOL anterior chamber intraocular lens; IOL intraocular lens; PCIOL posterior chamber intraocular lens; Phaco/IOL phacoemulsification with intraocular lens placement; [place] photorefractive keratectomy; LASIK laser assisted in situ keratomileusis;  HTN hypertension; DM diabetes mellitus; COPD chronic obstructive pulmonary disease

## 2021-09-28 ENCOUNTER — Encounter (HOSPITAL_COMMUNITY): Payer: Self-pay | Admitting: Radiology

## 2021-09-28 ENCOUNTER — Ambulatory Visit (HOSPITAL_COMMUNITY)
Admission: RE | Admit: 2021-09-28 | Discharge: 2021-09-28 | Disposition: A | Discharge status: Home / Self Care | Payer: Medicare HMO | Source: Ambulatory Visit | Attending: Hematology | Admitting: Hematology

## 2021-09-28 ENCOUNTER — Other Ambulatory Visit: Payer: Self-pay

## 2021-09-28 DIAGNOSIS — K639 Disease of intestine, unspecified: Secondary | ICD-10-CM | POA: Diagnosis not present

## 2021-09-28 DIAGNOSIS — N3289 Other specified disorders of bladder: Secondary | ICD-10-CM | POA: Diagnosis not present

## 2021-09-28 DIAGNOSIS — I2510 Atherosclerotic heart disease of native coronary artery without angina pectoris: Secondary | ICD-10-CM | POA: Diagnosis not present

## 2021-09-28 DIAGNOSIS — C189 Malignant neoplasm of colon, unspecified: Secondary | ICD-10-CM | POA: Insufficient documentation

## 2021-09-28 DIAGNOSIS — C19 Malignant neoplasm of rectosigmoid junction: Secondary | ICD-10-CM | POA: Diagnosis not present

## 2021-09-28 DIAGNOSIS — R918 Other nonspecific abnormal finding of lung field: Secondary | ICD-10-CM | POA: Diagnosis not present

## 2021-09-28 DIAGNOSIS — I700 Atherosclerosis of aorta: Secondary | ICD-10-CM | POA: Diagnosis not present

## 2021-09-28 MED ORDER — IOHEXOL 300 MG/ML  SOLN
100.0000 mL | Freq: Once | INTRAMUSCULAR | Status: AC | PRN
  Administered 2021-09-28: 100 mL via INTRAVENOUS

## 2021-09-30 ENCOUNTER — Other Ambulatory Visit (HOSPITAL_COMMUNITY): Payer: Self-pay | Admitting: *Deleted

## 2021-09-30 ENCOUNTER — Ambulatory Visit (INDEPENDENT_AMBULATORY_CARE_PROVIDER_SITE_OTHER): Payer: Medicare HMO | Admitting: Ophthalmology

## 2021-09-30 ENCOUNTER — Other Ambulatory Visit: Payer: Self-pay

## 2021-09-30 ENCOUNTER — Encounter (INDEPENDENT_AMBULATORY_CARE_PROVIDER_SITE_OTHER): Payer: Self-pay | Admitting: Ophthalmology

## 2021-09-30 DIAGNOSIS — H43813 Vitreous degeneration, bilateral: Secondary | ICD-10-CM

## 2021-09-30 DIAGNOSIS — H353231 Exudative age-related macular degeneration, bilateral, with active choroidal neovascularization: Secondary | ICD-10-CM

## 2021-09-30 DIAGNOSIS — C189 Malignant neoplasm of colon, unspecified: Secondary | ICD-10-CM

## 2021-09-30 DIAGNOSIS — Z961 Presence of intraocular lens: Secondary | ICD-10-CM | POA: Diagnosis not present

## 2021-09-30 DIAGNOSIS — H04123 Dry eye syndrome of bilateral lacrimal glands: Secondary | ICD-10-CM | POA: Diagnosis not present

## 2021-09-30 DIAGNOSIS — H3581 Retinal edema: Secondary | ICD-10-CM

## 2021-09-30 DIAGNOSIS — H40053 Ocular hypertension, bilateral: Secondary | ICD-10-CM | POA: Diagnosis not present

## 2021-09-30 MED ORDER — AFLIBERCEPT 2MG/0.05ML IZ SOLN FOR KALEIDOSCOPE
2.0000 mg | INTRAVITREAL | Status: AC | PRN
  Administered 2021-09-30: 2 mg via INTRAVITREAL

## 2021-10-01 ENCOUNTER — Telehealth: Payer: Medicare HMO

## 2021-10-02 NOTE — Progress Notes (Signed)
[place], [place] [address]   CLINIC:  Medical Oncology/Hematology  PCP:  Janora Norlander, DO [address] [place] [address] [phone]   REASON FOR VISIT:  Follow-up for right colon cancer  PRIOR THERAPY:  1. Right hemicolectomy on 04/08/2020. 2. FOLFOX x 3 cycles from 05/19/2020 to 07/14/2020.  NGS Results: Foundation 1 KRAS/NRAS wild-type, MSI--high, TMB 55 Muts/Mb, BRAF V 600 E  CURRENT THERAPY: Keytruda every 3 weeks  BRIEF ONCOLOGIC HISTORY:  Oncology History  Malignant neoplasm of colon ([place])  04/07/2020 Initial Diagnosis   Malignant neoplasm of ascending colon ([place])   05/13/2020 Genetic Testing   Foundation One     05/19/2020 - 07/02/2020 Chemotherapy   The patient had palonosetron (ALOXI) injection 0.25 mg, 0.25 mg, Intravenous,  Once, 2 of 12 cycles Administration: 0.25 mg (05/19/2020), 0.25 mg (06/30/2020) leucovorin 522 mg in dextrose 5 % 250 mL infusion, 320 mg/m2 = 522 mg (80 % of original dose 400 mg/m2), Intravenous,  Once, 2 of 12 cycles Dose modification: 320 mg/m2 (80 % of original dose 400 mg/m2, Cycle 1, Reason: Provider Judgment), 174.9449 mg/m2 (66.7 % of original dose 400 mg/m2, Cycle 2, Reason: Provider Judgment) Administration: 522 mg (05/19/2020), 434 mg (06/30/2020) oxaliplatin (ELOXATIN) 110 mg in dextrose 5 % 500 mL chemo infusion, 68 mg/m2 = 110 mg (80 % of original dose 85 mg/m2), Intravenous,  Once, 1 of 1 cycle Dose modification: 68 mg/m2 (80 % of original dose 85 mg/m2, Cycle 1, Reason: Provider Judgment) Administration: 110 mg (05/19/2020) fluorouracil (ADRUCIL) chemo injection 500 mg, 320 mg/m2 = 500 mg (80 % of original dose 400 mg/m2), Intravenous,  Once, 2 of 12 cycles Dose modification: 320 mg/m2 (80 % of original dose 400 mg/m2, Cycle 1, Reason: Provider Judgment), 675.9163 mg/m2 (66.7 % of original dose 400 mg/m2, Cycle 2, Reason: Provider Judgment) Administration: 500 mg (05/19/2020), 450  mg (06/30/2020) fluorouracil (ADRUCIL) 3,150 mg in sodium chloride 0.9 % 87 mL chemo infusion, 1,920 mg/m2 = 3,150 mg (80 % of original dose 2,400 mg/m2), Intravenous, 1 Day/Dose, 2 of 12 cycles Dose modification: 1,920 mg/m2 (80 % of original dose 2,400 mg/m2, Cycle 1, Reason: Provider Judgment), 1,600 mg/m2 (66.7 % of original dose 2,400 mg/m2, Cycle 2, Reason: Provider Judgment) Administration: 3,150 mg (05/19/2020), 2,600 mg (06/30/2020)   for chemotherapy treatment.     08/03/2020 Genetic Testing   Guardant Reveal Testing     08/14/2020 Genetic Testing   No pathogenic variants detected in [facility] Common Hereditary Cancers Panel.  Variant of uncertain significance (VUS) detected in HOXB13 at c.634G>A (p.Ala212Thr). The Common Hereditary Cancers Panel offered by [facility] includes sequencing and/or deletion duplication testing of the following 48 genes: APC, ATM, AXIN2, BARD1, BMPR1A, BRCA1, BRCA2, BRIP1, CDH1, CDK4, CDKN2A (p14ARF), CDKN2A (p16INK4a), CHEK2, CTNNA1, DICER1, EPCAM (Deletion/duplication testing only), FLCN, GREM1 (promoter region deletion/duplication testing only), KIT, MEN1, MLH1, MSH2, MSH3, MSH6, MUTYH, NBN, NF1, NHTL1, PALB2, PDGFRA, PMS2, POLD1, POLE, PTEN, RAD50, RAD51C, RAD51D, RNF43, SDHB, SDHC, SDHD, SMAD4, SMARCA4. STK11, TP53, TSC1, TSC2, and VHL.  The following genes were evaluated for sequence changes only: SDHA and HOXB13 c.251G>A variant only. The report date is August 14, 2020.    11/05/2020 Cancer Staging   Staging form: Colon and Rectum, AJCC 8th Edition - Pathologic stage from 11/05/2020: Stage IVC (rpTX, pN0, pM1c) - Signed by Derek Jack, MD on 11/05/2020    11/10/2020 -  Chemotherapy   Patient is on Treatment Plan :  COLORECTAL Pembrolizumab q21d       CANCER STAGING: Cancer Staging Malignant neoplasm of colon [facility]) Staging form: Colon and Rectum, AJCC 8th Edition - Clinical stage from 04/27/2020: Stage IIIB (cT4a, cN1a, cM0) - Unsigned -  Pathologic stage from 11/05/2020: Stage IVC (rpTX, pN0, pM1c) - Signed by Derek Jack, MD on 11/05/2020   INTERVAL HISTORY:  [name], a 75 y.o. female, returns for routine follow-up and consideration for next cycle of chemotherapy. [name] was last seen on 09/14/2021.  Due for cycle #14 of Keytruda today.   Overall, she tells me she has been feeling pretty well. She denies cough, weight loss, abdominal pain, current bleeding, black stools, diarrhea, constipation, skin rash, itching, and new pains. Her appetite is excellent. She is able to walk unassisted.   Overall, she feels ready for next cycle of chemo today.   REVIEW OF SYSTEMS:  Review of Systems  Constitutional:  Negative for appetite change, fatigue and unexpected weight change.  HENT:   Negative for nosebleeds.   Respiratory:  Negative for cough and hemoptysis.   Gastrointestinal:  Negative for abdominal pain, blood in stool, constipation and diarrhea.  Genitourinary:  Negative for hematuria and vaginal bleeding.   Musculoskeletal:  Negative for arthralgias and myalgias.  Skin:  Negative for itching and rash.  Hematological:  Does not bruise/bleed easily.  All other systems reviewed and are negative.  PAST MEDICAL/SURGICAL HISTORY:  Past Medical History:  Diagnosis Date   Arthritis    Cancer [facility])    Cataract    OU   Colon cancer ([place])    Family history of pancreatic cancer 08/21/2020   Family history of uterine cancer 08/21/2020   H/O cesarean section    Hx of tonsillectomy    Hyperlipidemia    Hypertension    Macular degeneration    Exu ARMD OU   Paroxysmal atrial fibrillation ([place]) 07/05/2020   Past Surgical History:  Procedure Laterality Date   BIOPSY  04/06/2020   Procedure: BIOPSY;  Surgeon: Rogene Houston, MD;  Location[facility];  Service: Endoscopy;;   CARPAL TUNNEL RELEASE Right    CATARACT EXTRACTION W/PHACO Left 10/11/2019   Procedure: CATARACT EXTRACTION PHACO AND INTRAOCULAR  LENS PLACEMENT ([place]);  Surgeon: Baruch Goldmann, MD;  Location: [facility] ORS;  Service: Ophthalmology;  Laterality: Left;  CDE: 8.56   CATARACT EXTRACTION W/PHACO Right 10/25/2019   Procedure: CATARACT EXTRACTION PHACO AND INTRAOCULAR LENS PLACEMENT ([facility]);  Surgeon: Baruch Goldmann, MD;  Location: [facility] OR[facility];  Service: Ophthalmology;  Laterality: Right;  CDE: 5.67   CESAREAN SECTION     COLONOSCOPY N/A 04/06/2020   Procedure: COLONOSCOPY;  Surgeon: Rogene Houston, MD;  Location: [facility];  Service: Endoscopy;  Laterality: N/A;   CYSTOSCOPY WITH BIOPSY N/A 04/08/2020   Procedure: CYSTOSCOPY WITH BIOPSY;  Surgeon: Cleon Gustin, MD;  Location: [facility] OR[facility];  Service: Urology;  Laterality: N/A;   ESOPHAGOGASTRODUODENOSCOPY N/A 04/05/2020   Procedure: ESOPHAGOGASTRODUODENOSCOPY (EGD);  Surgeon: Rogene Houston, MD;  Location[facility];  Service: Endoscopy;  Laterality: N/A;   PARTIAL COLECTOMY N/A 04/08/2020   Procedure: PARTIAL COLECTOMY;  Surgeon: Virl Cagey, MD;  Location: [facility] OR[facility];  Service: General;  Laterality: N/A;   PORTACATH PLACEMENT Left 05/18/2020   Procedure: INSERTION PORT-A-CATH (ATTACHED CATHETER IN LEFT SUBCLAVIAN);  Surgeon: Virl Cagey, MD;  Location: [facility] OR[facility];  Service: General;  Laterality: Left;   TONSILLECTOMY      SOCIAL HISTORY:  Social History  Socioeconomic History   Marital status: Divorced    Spouse name: Not on file   Number of children: Not on file   Years of education: Not on file   Highest education level: Not on file  Occupational History   Not on file  Tobacco Use   Smoking status: Never   Smokeless tobacco: Never  Vaping Use   Vaping Use: Never used  Substance and Sexual Activity   Alcohol use: No   Drug use: No   Sexual activity: Not Currently  Other Topics Concern   Not on file  Social History Narrative   Not on file   Social Determinants of Health   Financial Resource Strain: Not on file  Food Insecurity: Not on file  Transportation  Needs: Not on file  Physical Activity: Not on file  Stress: Not on file  Social Connections: Not on file  Intimate Partner Violence: Not on file    FAMILY HISTORY:  Family History  Problem Relation Age of Onset   Macular degeneration Mother    Stroke Mother    Hypertension Mother    Dementia Mother    Lung cancer Father        dx late 20s; smoking hx   Diabetes Sister    Hypertension Sister    Leukemia Paternal Uncle        d. 51s   Cancer Maternal Aunt        ovarian or endometrial dx 42s   Pancreatic cancer Paternal Uncle        d. late 58s    CURRENT MEDICATIONS:  Current Outpatient Medications  Medication Sig Dispense Refill   alendronate (FOSAMAX) 70 MG tablet Take 1 tablet (70 mg total) by mouth every 7 (seven) days. Take with a full glass of water on an empty stomach. 12 tablet 3   amLODipine (NORVASC) 5 MG tablet Take 1 tablet (5 mg total) by mouth daily. 90 tablet 3   atorvastatin (LIPITOR) 20 MG tablet Take 1 tablet (20 mg total) by mouth daily. 90 tablet 3   cholecalciferol (VITAMIN D3) 25 MCG (1000 UT) tablet Take 1,000 Units by mouth daily.     CRANBERRY FRUIT PO Take 1 tablet by mouth daily.     diclofenac Sodium (VOLTAREN) 1 % GEL APPLY 4 GRAMS TO AFFECTED AREA(S) 4 TIMES A DAY 400 g 2   dorzolamide-timolol (COSOPT) 22.3-6.8 MG/ML ophthalmic solution PLACE 1 DROP INTO BOTH EYES 2 TIMES A DAY (Patient taking differently: Place 1 drop into both eyes 1 day or 1 dose.) 10 mL 0   dorzolamide-timolol (COSOPT) 22.3-6.8 MG/ML ophthalmic solution Place 1 drop into both eyes 2 (two) times daily. 10 mL 3   ELIQUIS 5 MG TABS tablet Take 1 tablet (5 mg total) by mouth 2 (two) times daily. 60 tablet 6   FEROSUL 325 (65 Fe) MG tablet TAKE  (1)  TABLET TWICE A DAY WITH MEALS (BREAKFAST AND SUPPER) 409 tablet 0   Garlic 735 MG TABS Take by mouth.     lidocaine-prilocaine (EMLA) cream Apply a small amount to port a cath site and cover with plastic wrap 1 hour prior to  chemotherapy appointments 30 g 0   losartan-hydrochlorothiazide (HYZAAR) 100-25 MG tablet Take 1 tablet by mouth daily. 90 tablet 3   potassium chloride SA (KLOR-CON) 20 MEQ tablet Take 1 tablet (20 mEq total) by mouth 2 (two) times daily. 180 tablet 1   prochlorperazine (COMPAZINE) 10 MG tablet Take 1 tablet (10 mg total)  by mouth every 6 (six) hours as needed for nausea or vomiting. 30 tablet 0   No current facility-administered medications for this visit.    ALLERGIES:  Allergies  Allergen Reactions   Lisinopril Cough    PHYSICAL EXAM:  Performance status (ECOG): 1 - Symptomatic but completely ambulatory  There were no vitals filed for this visit. Wt Readings from Last 3 Encounters:  09/14/21 168 lb 12.8 oz (76.6 kg)  08/03/21 167 lb 9.6 oz (76 kg)  07/22/21 165 lb 3.2 oz (74.9 kg)   Physical Exam Vitals reviewed.  Constitutional:      Appearance: Normal appearance.  Cardiovascular:     Rate and Rhythm: Normal rate and regular rhythm.     Pulses: Normal pulses.     Heart sounds: Normal heart sounds.  Pulmonary:     Effort: Pulmonary effort is normal.     Breath sounds: Normal breath sounds.  Abdominal:     Palpations: Abdomen is soft. There is no hepatomegaly, splenomegaly or mass.     Tenderness: There is no abdominal tenderness.  Musculoskeletal:     Right lower leg: No edema.     Left lower leg: No edema.  Neurological:     General: No focal deficit present.     Mental Status: She is alert and oriented to person, place, and time.  Psychiatric:        Mood and Affect: Mood normal.        Behavior: Behavior normal.    LABORATORY DATA:  I have reviewed the labs as listed.  CBC Latest Ref Rng & Units 09/14/2021 08/03/2021 07/13/2021  WBC 4.0 - 10.5 K/uL 6.4 7.0 6.7  Hemoglobin 12.0 - 15.0 g/dL 13.1 13.3 12.7  Hematocrit 36.0 - 46.0 % 38.9 39.4 37.2  Platelets 150 - 400 K/uL 138(L) 147(L) 143(L)   CMP Latest Ref Rng & Units 09/14/2021 08/03/2021 07/13/2021  Glucose  70 - 99 mg/dL 109(H) 98 87  BUN 8 - 23 mg/dL $Remove'20 20 20  'TMGkuPs$ Creatinine 0.44 - 1.00 mg/dL 1.06(H) 1.04(H) 1.09(H)  Sodium 135 - 145 mmol/L 136 134(L) 136  Potassium 3.5 - 5.1 mmol/L 3.8 3.2(L) 3.1(L)  Chloride 98 - 111 mmol/L 97(L) 97(L) 98  CO2 22 - 32 mmol/L $RemoveB'31 29 30  'vkfgwYml$ Calcium 8.9 - 10.3 mg/dL 9.7 9.4 9.7  Total Protein 6.5 - 8.1 g/dL 7.6 8.0 7.7  Total Bilirubin 0.3 - 1.2 mg/dL 0.7 0.5 0.7  Alkaline Phos 38 - 126 U/L 72 73 72  AST 15 - 41 U/L $Remo'25 21 22  'JdWhb$ ALT 0 - 44 U/L $Remo'20 18 17    'wnnSD$ DIAGNOSTIC IMAGING:  I have independently reviewed the scans and discussed with the patient. CT CHEST ABDOMEN PELVIS W CONTRAST  Result Date: 09/29/2021 CLINICAL DATA:  Colorectal cancer.  Restaging. EXAM: CT CHEST, ABDOMEN, AND PELVIS WITH CONTRAST TECHNIQUE: Multidetector CT imaging of the chest, abdomen and pelvis was performed following the standard protocol during bolus administration of intravenous contrast. CONTRAST:  148mL OMNIPAQUE IOHEXOL 300 MG/ML  SOLN COMPARISON:  07/30/2021 FINDINGS: CT CHEST FINDINGS Cardiovascular: The heart size is normal. No substantial pericardial effusion. Moderate atherosclerotic calcification is noted in the wall of the thoracic aorta. Left Port-A-Cath tip is in the left innominate vein just proximal to the innominate vein confluence. Mediastinum/Nodes: No mediastinal lymphadenopathy. There is no hilar lymphadenopathy. The esophagus has normal imaging features. There is no axillary lymphadenopathy. Lungs/Pleura: Stable 4 mm right middle lobe nodule (83/3) no change 3 mm right parahilar nodule  on 88/3. Index 6 mm right lower lobe nodule measured previously is 6 mm again today on 79/3. Index left lower lobe nodule measured previously at 5 mm is 6 mm today on 79/3. Additional scattered tiny nodules are stable. No focal airspace consolidation. There is no evidence of pleural effusion. Musculoskeletal: No worrisome lytic or sclerotic osseous abnormality. CT ABDOMEN PELVIS FINDINGS  Hepatobiliary: No suspicious focal abnormality within the liver parenchyma. There is no evidence for gallstones, gallbladder wall thickening, or pericholecystic fluid. No intrahepatic or extrahepatic biliary dilation. Pancreas: No focal mass lesion. No dilatation of the main duct. No intraparenchymal cyst. No peripancreatic edema. Spleen: Stable hypodensity in the medial spleen, likely cyst or pseudocyst. Adrenals/Urinary Tract: No adrenal nodule or mass. Tiny hypoattenuating renal lesions are stable in the interval, likely cyst. No evidence for hydroureter. Circumferential bladder wall thickening again noted. Stomach/Bowel: Stomach is unremarkable. No gastric wall thickening. No evidence of outlet obstruction. Duodenum is normally positioned as is the ligament of Treitz. No small bowel wall thickening. No small bowel dilatation. Right hemicolectomy. No gross colonic mass. No colonic wall thickening. Vascular/Lymphatic: There is moderate atherosclerotic calcification of the abdominal aorta without aneurysm. There is no gastrohepatic or hepatoduodenal ligament lymphadenopathy. No retroperitoneal or mesenteric lymphadenopathy. Stable small lymph nodes in the hepatoduodenal ligament. No pelvic sidewall lymphadenopathy. Reproductive: Unremarkable. Other: No intraperitoneal free fluid. 2 cm focus of soft tissue adjacent to the ileocolic anastomosis (38/3) is stable in the interval. Musculoskeletal: No worrisome lytic or sclerotic osseous abnormality. IMPRESSION: 1. Stable exam. No new or progressive findings to suggest recurrent or metastatic disease in the chest, abdomen, or pelvis. 2. Soft tissue lesion adjacent to the ileocolic anastomosis is unchanged. 3. Scattered tiny bilateral pulmonary nodules are stable in the interval. Continued attention on follow-up recommended. 4. Left Port-A-Cath tip is in the left innominate vein just proximal to the innominate vein confluence. 5. Circumferential bladder wall thickening  again noted. 6. Aortic Atherosclerosis (ICD10-I70.0). Electronically Signed   By: Misty Stanley M.D.   On: 09/29/2021 12:46   Intravitreal Injection, Pharmacologic Agent - OS - Left Eye  Result Date: 09/30/2021 Time Out 09/30/2021. 8:20 AM. Confirmed correct patient, procedure, site, and patient consented. Anesthesia Topical anesthesia was used. Anesthetic medications included Lidocaine 2%, Proparacaine 0.5%. Procedure Preparation included 5% betadine to ocular surface, eyelid speculum. A (32g) needle was used. Injection: 2 mg aflibercept 2 MG/0.05ML   Route: Intravitreal, Site: Left Eye   NDC: [id number], Lot: [id number], Expiration date: 08/04/2022, Waste: 0.05 mL Post-op Post injection exam found visual acuity of at least counting fingers. The patient tolerated the procedure well. There were no complications. The patient received written and verbal post procedure care education. Post injection medications were not given.   OCT, Retina - OU - Both Eyes  Result Date: 09/30/2021 Right Eye Quality was good. Central Foveal Thickness: 531. Progression has been stable. Findings include pigment epithelial detachment, epiretinal membrane, subretinal hyper-reflective material, retinal drusen , abnormal foveal contour, no SRF, intraretinal fluid, disciform scar, outer retinal tubulation, outer retinal atrophy (Stable sub-retinal scar, mild persistent cystic changes overlying). Left Eye Quality was good. Central Foveal Thickness: 304. Progression has been stable. Findings include subretinal hyper-reflective material, pigment epithelial detachment, intraretinal fluid, retinal drusen , outer retinal atrophy, normal foveal contour, no SRF (persistent cystic changes overlying PED/SRHM). Notes *Images captured and stored on drive Diagnosis / Impression: Exudative ARMD OU OD: Stable sub-retinal scar, mild persistent cystic changes overlying OS: persistent cystic changes overlying PED/SRHM Clinical management: See below  Abbreviations: NFP - Normal foveal profile. CME - cystoid macular edema. PED - pigment epithelial detachment. IRF - intraretinal fluid. SRF - subretinal fluid. EZ - ellipsoid zone. ERM - epiretinal membrane. ORA - outer retinal atrophy. ORT - outer retinal tubulation. SRHM - subretinal hyper-reflective material     ASSESSMENT:  1.  Stage IIIb (T4AN1A) poorly differentiated right colon adenocarcinoma: -Right hemicolectomy on 04/07/2020 with poorly differentiated adenocarcinoma, pT4a, positive radial margin, 1/15 lymph nodes positive, loss of MLH1 and PMS2, bladder biopsy negative for malignancy. -CTAP on 04/06/2020 showed circumferential ascending colon mass with numerous borderline enlarged pericolonic lymph nodes.  No findings of hepatic metastatic disease. -CEA on 04/04/2020 was 12.2.  CEA improved to 1.7 on 05/13/2020. -PET scan on 05/11/2020 shows mild FDG uptake associated with the anastomotic site.  Small pulmonary nodules that do not show elevated FDG activity, remain nonspecific, subcentimeter.  Persistent but improved bladder wall thickening. -Cycle 1 of dose reduced FOLFOX on 05/19/2020, followed by 2 hospitalizations, 1 from acute kidney injury from diarrhea and second admission for C. difficile colitis. -Cycle 2 of 5-FU and leucovorin dose reduced on 06/30/2020.  Chemotherapy discontinued secondary to intolerance. -Follow-up CT scan on 11/02/2020 with multiple soft tissue lesions in the central and right mesentery measuring up to 3.1 x 1.7 cm.  Mild lymphadenopathy in the hepatic duodenal ligament, retroperitoneal space, right common iliac chain, bilateral external iliac chains.  17 mm soft tissue nodule in the lower anterior abdominal wall.  Bilateral tiny pulmonary nodules not substantially changed.  2.9 x 2.5 cm low-density lesion along the posterior uterus is indeterminate. -Pembrolizumab started on 11/10/2020. -CT CAP from 02/09/2021 showed interval near complete resolution of previously demonstrated  nodal mass in the ileocolonic mesentery.  Residual ill-defined nodule medial to the tip of the right hepatic lobe.  Stable small pulmonary nodules bilaterally consistent with benign findings.   2.  Family history: -Paternal uncle had colon cancer.  Maternal aunt had brain cancer and another maternal aunt had gynecological malignancy.  Father had lung cancer and was a smoker. - Genetic testing did not reveal any notable mutations.   3.  Diffuse erythema and nodularity of the dome of the bladder: -CT scan showed severely thickened bladder wall. -Cystoscopy on 04/08/2020 showed diffuse erythema, nodularity in the posterior wall tracking to the dome.  Ureteral orifices were in normal locations.  Biopsies were benign.   PLAN:  1.  Stage IV right colon adenocarcinoma, MSI-high: - CT scan on 07/30/2021 showed postoperative changes in the right hemicolectomy with no change in the triangular soft tissue lesion in posterior between the right hemicolectomy site and top of the liver.  No evidence of new metastatic disease.  Multiple small lung nodules stable. - Reviewed labs from today which shows normal LFTs.  Mildly elevated creatinine is stable.  CBC was grossly normal.  Mild thrombocytopenia stable.  TSH is 2.8. - Last CEA was 2.2. - Reviewed CT from 09/27/2021 which showed no new or progressive findings.  Soft tissue lesion adjacent to the ileocolic anastomosis unchanged.  Bilateral lung nodules are stable. - She does not have any immunotherapy related side effects.  She has mild weight gain.  Recommend proceeding with immunotherapy today and in 3 weeks.  RTC 6 weeks for follow-up.   2.  Paroxysmal atrial fibrillation: - We restarted back on Eliquis at last visit.  No bleeding issues reported.   3.  Hypertension: - Continue Hyzaar and amlodipine.  Blood pressure is well controlled.  4.  Hypokalemia: -  Continue potassium 40 mEq daily.  Potassium today is normal.   Orders placed this encounter:  No  orders of the defined types were placed in this encounter.    Derek Jack, MD [place] [phone]   I, Thana Ates, am acting as a scribe for Dr. Derek Jack.  I, Derek Jack MD, have reviewed the above documentation for accuracy and completeness, and I agree with the above.

## 2021-10-04 ENCOUNTER — Other Ambulatory Visit: Payer: Self-pay

## 2021-10-04 ENCOUNTER — Inpatient Hospital Stay (HOSPITAL_COMMUNITY): Payer: Medicare HMO

## 2021-10-04 ENCOUNTER — Inpatient Hospital Stay (HOSPITAL_BASED_OUTPATIENT_CLINIC_OR_DEPARTMENT_OTHER): Payer: Medicare HMO | Admitting: Hematology

## 2021-10-04 VITALS — BP 148/81 | HR 66 | Resp 16

## 2021-10-04 VITALS — BP 170/71 | HR 80 | Temp 98.4°F | Resp 18 | Wt 173.2 lb

## 2021-10-04 DIAGNOSIS — Z5112 Encounter for antineoplastic immunotherapy: Secondary | ICD-10-CM | POA: Diagnosis not present

## 2021-10-04 DIAGNOSIS — C189 Malignant neoplasm of colon, unspecified: Secondary | ICD-10-CM

## 2021-10-04 DIAGNOSIS — E876 Hypokalemia: Secondary | ICD-10-CM | POA: Diagnosis not present

## 2021-10-04 DIAGNOSIS — Z79899 Other long term (current) drug therapy: Secondary | ICD-10-CM | POA: Diagnosis not present

## 2021-10-04 DIAGNOSIS — C182 Malignant neoplasm of ascending colon: Secondary | ICD-10-CM | POA: Diagnosis not present

## 2021-10-04 LAB — COMPREHENSIVE METABOLIC PANEL
ALT: 32 U/L (ref 0–44)
AST: 29 U/L (ref 15–41)
Albumin: 4.3 g/dL (ref 3.5–5.0)
Alkaline Phosphatase: 75 U/L (ref 38–126)
Anion gap: 10 (ref 5–15)
BUN: 21 mg/dL (ref 8–23)
CO2: 30 mmol/L (ref 22–32)
Calcium: 9.7 mg/dL (ref 8.9–10.3)
Chloride: 97 mmol/L — ABNORMAL LOW (ref 98–111)
Creatinine, Ser: 1.08 mg/dL — ABNORMAL HIGH (ref 0.44–1.00)
GFR, Estimated: 54 mL/min — ABNORMAL LOW (ref >60)
Glucose, Bld: 118 mg/dL — ABNORMAL HIGH (ref 70–99)
Potassium: 4.1 mmol/L (ref 3.5–5.1)
Sodium: 137 mmol/L (ref 135–145)
Total Bilirubin: 0.8 mg/dL (ref 0.3–1.2)
Total Protein: 7.8 g/dL (ref 6.5–8.1)

## 2021-10-04 LAB — CBC WITH DIFFERENTIAL/PLATELET
Abs Immature Granulocytes: 0.05 10*3/uL (ref 0.00–0.07)
Basophils Absolute: 0 10*3/uL (ref 0.0–0.1)
Basophils Relative: 1 %
Eosinophils Absolute: 0.4 10*3/uL (ref 0.0–0.5)
Eosinophils Relative: 6 %
HCT: 39 % (ref 36.0–46.0)
Hemoglobin: 13.2 g/dL (ref 12.0–15.0)
Immature Granulocytes: 1 %
Lymphocytes Relative: 19 %
Lymphs Abs: 1.2 10*3/uL (ref 0.7–4.0)
MCH: 32.4 pg (ref 26.0–34.0)
MCHC: 33.8 g/dL (ref 30.0–36.0)
MCV: 95.8 fL (ref 80.0–100.0)
Monocytes Absolute: 0.6 10*3/uL (ref 0.1–1.0)
Monocytes Relative: 9 %
Neutro Abs: 4.3 10*3/uL (ref 1.7–7.7)
Neutrophils Relative %: 64 %
Platelets: 139 10*3/uL — ABNORMAL LOW (ref 150–400)
RBC: 4.07 MIL/uL (ref 3.87–5.11)
RDW: 13.1 % (ref 11.5–15.5)
WBC: 6.5 10*3/uL (ref 4.0–10.5)
nRBC: 0 % (ref 0.0–0.2)

## 2021-10-04 LAB — TSH: TSH: 2.852 u[IU]/mL (ref 0.350–4.500)

## 2021-10-04 MED ORDER — SODIUM CHLORIDE 0.9 % IV SOLN
200.0000 mg | Freq: Once | INTRAVENOUS | Status: AC
  Administered 2021-10-04: 200 mg via INTRAVENOUS
  Filled 2021-10-04: qty 8

## 2021-10-04 MED ORDER — SODIUM CHLORIDE 0.9 % IV SOLN: Freq: Once | INTRAVENOUS | Status: AC

## 2021-10-04 MED ORDER — HEPARIN SOD (PORK) LOCK FLUSH 100 UNIT/ML IV SOLN
500.0000 [IU] | Freq: Once | INTRAVENOUS | Status: AC | PRN
  Administered 2021-10-04: 500 [IU]

## 2021-10-04 MED ORDER — SODIUM CHLORIDE 0.9% FLUSH
10.0000 mL | INTRAVENOUS | Status: DC | PRN
  Administered 2021-10-04: 10 mL

## 2021-10-04 NOTE — Progress Notes (Signed)
Patient presents today for chemotherapy infusion.  Patient is in satisfactory condition with no new complaints voiced.  Vital signs are stable.  Labs reviewed by Dr. Katragadda during her office visit.  All labs are within treatment parameters.  We will proceed with treatment per MD orders.   Patient tolerated treatment well with no complaints voiced.  Patient left ambulatory in stable condition.  Vital signs stable at discharge.  Follow up as scheduled.    

## 2021-10-04 NOTE — Patient Instructions (Signed)
[facility]  Discharge Instructions: Thank you for choosing [facility] to provide your oncology and hematology care.  If you have a lab appointment with the Cancer Center, please come in thru the Main Entrance and check in at the main information desk.  Wear comfortable clothing and clothing appropriate for easy access to any Portacath or PICC line.   We strive to give you quality time with your provider. You may need to reschedule your appointment if you arrive late (15 or more minutes).  Arriving late affects you and other patients whose appointments are after yours.  Also, if you miss three or more appointments without notifying the office, you may be dismissed from the clinic at the provider's discretion.      For prescription refill requests, have your pharmacy contact our office and allow 72 hours for refills to be completed.        To help prevent nausea and vomiting after your treatment, we encourage you to take your nausea medication as directed.  BELOW ARE SYMPTOMS THAT SHOULD BE REPORTED IMMEDIATELY: *FEVER GREATER THAN 100.4 F (38 C) OR HIGHER *CHILLS OR SWEATING *NAUSEA AND VOMITING THAT IS NOT CONTROLLED WITH YOUR NAUSEA MEDICATION *UNUSUAL SHORTNESS OF BREATH *UNUSUAL BRUISING OR BLEEDING *URINARY PROBLEMS (pain or burning when urinating, or frequent urination) *BOWEL PROBLEMS (unusual diarrhea, constipation, pain near the anus) TENDERNESS IN MOUTH AND THROAT WITH OR WITHOUT PRESENCE OF ULCERS (sore throat, sores in mouth, or a toothache) UNUSUAL RASH, SWELLING OR PAIN  UNUSUAL VAGINAL DISCHARGE OR ITCHING   Items with * indicate a potential emergency and should be followed up as soon as possible or go to the Emergency Department if any problems should occur.  Please show the CHEMOTHERAPY ALERT CARD or IMMUNOTHERAPY ALERT CARD at check-in to the Emergency Department and triage nurse.  Should you have questions after your visit or need to cancel  or reschedule your appointment, please contact [facility] [phone]  and follow the prompts.  Office hours are 8:00 a.m. to 4:30 p.m. Monday - Friday. Please note that voicemails left after 4:00 p.m. may not be returned until the following business day.  We are closed weekends and major holidays. You have access to a nurse at all times for urgent questions. Please call the main number to the clinic [phone] and follow the prompts.  For any non-urgent questions, you may also contact your provider using MyChart. We now offer e-Visits for anyone 18 and older to request care online for non-urgent symptoms. For details visit [email or web address].   Also download the MyChart app! Go to the app store, search "MyChart", open the app, select [facility], and log in with your MyChart username and password.  Due to Covid, a mask is required upon entering the hospital/clinic. If you do not have a mask, one will be given to you upon arrival. For doctor visits, patients may have 1 support person aged 18 or older with them. For treatment visits, patients cannot have anyone with them due to current Covid guidelines and our immunocompromised population.  

## 2021-10-04 NOTE — Progress Notes (Signed)
Patient has been examined, vital signs and labs have been reviewed by Dr. Katragadda. ANC, Creatinine, LFTs, hemoglobin, and platelets are within treatment parameters per Dr. Katragadda. Patient may proceed with treatment per M.D.   

## 2021-10-04 NOTE — Patient Instructions (Signed)
[place] at [facility] Discharge Instructions  You were seen and examined today by Dr. Delton Coombes. He reviewed your lab work which was all within normal limits.  You will receive treatment today and in 3 weeks. Return as scheduled in 6 weeks for lab work, office visit, and treatment.    Thank you for choosing [place] at [facility] to provide your oncology and hematology care.  To afford each patient quality time with our provider, please arrive at least 15 minutes before your scheduled appointment time.   If you have a lab appointment with the [facility] please come in thru the Main Entrance and check in at the main information desk.  You need to re-schedule your appointment should you arrive 10 or more minutes late.  We strive to give you quality time with our providers, and arriving late affects you and other patients whose appointments are after yours.  Also, if you no show three or more times for appointments you may be dismissed from the clinic at the providers discretion.     Again, thank you for choosing [facility].  Our hope is that these requests will decrease the amount of time that you wait before being seen by our physicians.       _____________________________________________________________  Should you have questions after your visit to [facility], please contact our office at [phone] and follow the prompts.  Our office hours are 8:00 a.m. and 4:30 p.m. Monday - Friday.  Please note that voicemails left after 4:00 p.m. may not be returned until the following business day.  We are closed weekends and major holidays.  You do have access to a nurse 24-7, just call the main number to the clinic [phone] and do not press any options, hold on the line and a nurse will answer the phone.    For prescription refill requests, have your pharmacy contact our office and allow 72 hours.    Due to  Covid, you will need to wear a mask upon entering the hospital. If you do not have a mask, a mask will be given to you at the Main Entrance upon arrival. For doctor visits, patients may have 1 support person age 78 or older with them. For treatment visits, patients can not have anyone with them due to social distancing guidelines and our immunocompromised population.

## 2021-10-05 ENCOUNTER — Telehealth: Payer: Medicare HMO | Admitting: *Deleted

## 2021-10-22 ENCOUNTER — Ambulatory Visit: Payer: Medicare HMO | Admitting: Family Medicine

## 2021-10-25 ENCOUNTER — Inpatient Hospital Stay (HOSPITAL_COMMUNITY): Payer: Medicare HMO

## 2021-11-01 ENCOUNTER — Telehealth: Payer: Self-pay | Admitting: Family Medicine

## 2021-11-05 NOTE — Progress Notes (Addendum)
[facility] Note  11/11/2021     CHIEF COMPLAINT Patient presents for Retina Follow Up   HISTORY OF PRESENT ILLNESS: Gabrielle [name] is a 75 y.o. female who presents to the clinic today for:   HPI     Retina Follow Up   Patient presents with  Wet AMD.  In both eyes.  Severity is moderate.  Duration of 6 weeks.  Since onset it is stable.  I, the attending physician,  performed the HPI with the patient and updated documentation appropriately.        Comments   Pt here for 6 wk ret f/u for exu ARMD OU. Pt states vision is about the same, no ocular pain or discomfort reported. Pt reports taking Cosopt BID OU.       Last edited by Bernarda Caffey, MD on 11/11/2021 11:31 AM.     Pt states vision is "about the same"  Referring physician: Janora Norlander, DO [address],  [place] [address]  HISTORICAL INFORMATION:   Selected notes from the MEDICAL RECORD NUMBER Referred by Dr. Cristela Blue for concern of ARMD OU   CURRENT MEDICATIONS: Current Outpatient Medications (Ophthalmic Drugs)  Medication Sig   dorzolamide-timolol (COSOPT) 22.3-6.8 MG/ML ophthalmic solution PLACE 1 DROP INTO BOTH EYES 2 TIMES A DAY (Patient taking differently: Place 1 drop into both eyes 1 day or 1 dose.)   dorzolamide-timolol (COSOPT) 22.3-6.8 MG/ML ophthalmic solution Place 1 drop into both eyes 2 (two) times daily.   No current facility-administered medications for this visit. (Ophthalmic Drugs)   Current Outpatient Medications (Other)  Medication Sig   alendronate (FOSAMAX) 70 MG tablet Take 1 tablet (70 mg total) by mouth every 7 (seven) days. Take with a full glass of water on an empty stomach.   amLODipine (NORVASC) 5 MG tablet Take 1 tablet (5 mg total) by mouth daily.   atorvastatin (LIPITOR) 20 MG tablet Take 1 tablet (20 mg total) by mouth daily.   cholecalciferol (VITAMIN D3) 25 MCG (1000 UT) tablet Take 1,000 Units by mouth daily.   CRANBERRY FRUIT PO  Take 1 tablet by mouth daily.   diclofenac Sodium (VOLTAREN) 1 % GEL APPLY 4 GRAMS TO AFFECTED AREA(S) 4 TIMES A DAY   ELIQUIS 5 MG TABS tablet Take 1 tablet (5 mg total) by mouth 2 (two) times daily.   FEROSUL 325 (65 Fe) MG tablet TAKE  (1)  TABLET TWICE A DAY WITH MEALS (BREAKFAST AND SUPPER)   Garlic 951 MG TABS Take by mouth.   lidocaine-prilocaine (EMLA) cream Apply a small amount to port a cath site and cover with plastic wrap 1 hour prior to chemotherapy appointments   losartan-hydrochlorothiazide (HYZAAR) 100-25 MG tablet Take 1 tablet by mouth daily.   potassium chloride SA (KLOR-CON) 20 MEQ tablet Take 1 tablet (20 mEq total) by mouth 2 (two) times daily.   prochlorperazine (COMPAZINE) 10 MG tablet Take 1 tablet (10 mg total) by mouth every 6 (six) hours as needed for nausea or vomiting. (Patient not taking: Reported on 10/04/2021)   No current facility-administered medications for this visit. (Other)   REVIEW OF SYSTEMS: ROS   Positive for: Gastrointestinal, Genitourinary, Musculoskeletal, Cardiovascular, Eyes Negative for: Constitutional, Neurological, Skin, HENT, Endocrine, Respiratory, Psychiatric, Allergic/Imm, Heme/Lymph Last edited by Kingsley Spittle, COT on 11/11/2021  8:01 AM.     ALLERGIES Allergies  Allergen Reactions   Lisinopril Cough   PAST MEDICAL HISTORY Past Medical History:  Diagnosis Date   Arthritis    Cancer [facility])    Cataract    OU   Colon cancer ([place])    Family history of pancreatic cancer 08/21/2020   Family history of uterine cancer 08/21/2020   H/O cesarean section    Hx of tonsillectomy    Hyperlipidemia    Hypertension    Macular degeneration    Exu ARMD OU   Paroxysmal atrial fibrillation ([place]) 07/05/2020   Past Surgical History:  Procedure Laterality Date   BIOPSY  04/06/2020   Procedure: BIOPSY;  Surgeon: Rogene Houston, MD;  Location: [facility];  Service: Endoscopy;;   CARPAL TUNNEL RELEASE Right    CATARACT EXTRACTION  W/PHACO Left 10/11/2019   Procedure: CATARACT EXTRACTION PHACO AND INTRAOCULAR LENS PLACEMENT ([place]);  Surgeon: Baruch Goldmann, MD;  Location: [facility] ORS;  Service: Ophthalmology;  Laterality: Left;  CDE: 8.56   CATARACT EXTRACTION W/PHACO Right 10/25/2019   Procedure: CATARACT EXTRACTION PHACO AND INTRAOCULAR LENS PLACEMENT (IOC);  Surgeon: Baruch Goldmann, MD;  Location: [facility] ORS;  Service: Ophthalmology;  Laterality: Right;  CDE: 5.67   CESAREAN SECTION     COLONOSCOPY N/A 04/06/2020   Procedure: COLONOSCOPY;  Surgeon: Rogene Houston, MD;  Location: [facility];  Service: Endoscopy;  Laterality: N/A;   CYSTOSCOPY WITH BIOPSY N/A 04/08/2020   Procedure: CYSTOSCOPY WITH BIOPSY;  Surgeon: Cleon Gustin, MD;  Location: [facility] ORS;  Service: Urology;  Laterality: N/A;   ESOPHAGOGASTRODUODENOSCOPY N/A 04/05/2020   Procedure: ESOPHAGOGASTRODUODENOSCOPY (EGD);  Surgeon: Rogene Houston, MD;  Location: [facility];  Service: Endoscopy;  Laterality: N/A;   PARTIAL COLECTOMY N/A 04/08/2020   Procedure: PARTIAL COLECTOMY;  Surgeon: Virl Cagey, MD;  Location: [facility] ORS;  Service: General;  Laterality: N/A;   PORTACATH PLACEMENT Left 05/18/2020   Procedure: INSERTION PORT-A-CATH (ATTACHED CATHETER IN LEFT SUBCLAVIAN);  Surgeon: Virl Cagey, MD;  Location: [facility] ORS;  Service: General;  Laterality: Left;   TONSILLECTOMY      FAMILY HISTORY Family History  Problem Relation Age of Onset   Macular degeneration Mother    Stroke Mother    Hypertension Mother    Dementia Mother    Lung cancer Father        dx late 14s; smoking hx   Diabetes Sister    Hypertension Sister    Leukemia Paternal Uncle        d. 18s   Cancer Maternal Aunt        ovarian or endometrial dx 109s   Pancreatic cancer Paternal Uncle        d. late 26s    SOCIAL HISTORY Social History   Tobacco Use   Smoking status: Never   Smokeless tobacco: Never  Vaping Use   Vaping Use: Never used  Substance Use Topics   Alcohol  use: No   Drug use: No       OPHTHALMIC EXAM: Base Eye Exam     Visual Acuity (Snellen - Linear)       Right Left   Dist cc CF at 3' 20/80 -2   Dist ph cc NI NI    Correction: Glasses         Tonometry (Tonopen, 8:07 AM)       Right Left   Pressure 21 18         Pupils       Dark Light Shape React APD   Right 3 2 Round Brisk None   Left 3  2 Round Brisk None         Visual Fields (Counting fingers)       Left Right    Full Full         Extraocular Movement       Right Left    Full, Ortho Full, Ortho         Neuro/Psych     Oriented x3: Yes   Mood/Affect: Normal         Dilation     Both eyes: 1.0% Mydriacyl, 2.5% Phenylephrine @ 8:08 AM           Slit Lamp and Fundus Exam     Slit Lamp Exam       Right Left   Lids/Lashes Dermatochalasis - upper lid Dermatochalasis - upper lid   Conjunctiva/Sclera 1+ Injection White and quiet   Cornea Arcus, 3+ fine Punctpate epithelial erosions Arcus, 3+ fine Punctate epithelial erosions, mild tear film debris   Anterior Chamber Deep and quiet Deep and quiet   Iris Round and well dilated Round and well dilated   Lens PCIOL in good position, 1+ Posterior capsular opacification PC IOL in good position with open PC   Anterior Vitreous Vitreous syneresis, Posterior vitreous detachment, vitreous condensations Vitreous syneresis, Posterior vitreous detachment, silicone oil micro drops         Fundus Exam       Right Left   Disc pink and sharp, compact mild pallor, sharp, Compact, vascular loops superiorly   C/D Ratio 0.2 0.2   Macula Blunted foveal reflex, Drusen, Pigment clumping and atrophy, central thickening/pigmented disciform scar, +PED, no heme, persistent, trace cystic changes overlying central scar Blunted foveal reflex, central thickening, RPE clumping and atrophy, Drusen, RPE rip, trace cystic changes overlying PED, faint IRH nasal fovea   Vessels Vascular attenuation, Tortuous Vascular  attenuation, Tortuous   Periphery Attached, mild Reticular degeneration Attached, mild Reticular degeneration           Refraction     Wearing Rx       Sphere Cylinder Add   Right [place] Sphere +3.50   Left -0.50 Sphere +3.50    Type: PAL            IMAGING AND PROCEDURES  Imaging and Procedures for 03/21/18  OCT, Retina - OU - Both Eyes       Right Eye Quality was good. Central Foveal Thickness: 535. Progression has been stable. Findings include pigment epithelial detachment, epiretinal membrane, subretinal hyper-reflective material, retinal drusen , abnormal foveal contour, no SRF, intraretinal fluid, disciform scar, outer retinal tubulation, outer retinal atrophy (Stable sub-retinal scar, mild persistent cystic changes overlying).   Left Eye Quality was good. Central Foveal Thickness: 401. Progression has been stable. Findings include subretinal hyper-reflective material, pigment epithelial detachment, intraretinal fluid, retinal drusen , outer retinal atrophy, normal foveal contour, no SRF (persistent cystic changes overlying PED/SRHM).   Notes *Images captured and stored on drive  Diagnosis / Impression:  Exudative ARMD OU OD: Stable sub-retinal scar, mild persistent cystic changes overlying OS: persistent cystic changes overlying PED/SRHM  Clinical management:  See below  Abbreviations: NFP - Normal foveal profile. CME - cystoid macular edema. PED - pigment epithelial detachment. IRF - intraretinal fluid. SRF - subretinal fluid. EZ - ellipsoid zone. ERM - epiretinal membrane. ORA - outer retinal atrophy. ORT - outer retinal tubulation. SRHM - subretinal hyper-reflective material      Intravitreal Injection, Pharmacologic Agent - OS - Left Eye  Time Out 11/11/2021. 9:04 AM. Confirmed correct patient, procedure, site, and patient consented.   Anesthesia Topical anesthesia was used. Anesthetic medications included Lidocaine 2%, Proparacaine 0.5%.    Procedure Preparation included 5% betadine to ocular surface, eyelid speculum. A (32g) needle was used.   Injection: 2 mg aflibercept 2 MG/0.05ML   Route: Intravitreal, Site: Left Eye   NDC: [id number], Lot: [id number], Expiration date: 10/04/2022, Waste: 0.05 mL   Post-op Post injection exam found visual acuity of at least counting fingers. The patient tolerated the procedure well. There were no complications. The patient received written and verbal post procedure care education. Post injection medications were not given.            ASSESSMENT/PLAN:    ICD-10-CM   1. Exudative age-related macular degeneration of both eyes with active choroidal neovascularization (HCC)  H35.3231 Intravitreal Injection, Pharmacologic Agent - OS - Left Eye    aflibercept (EYLEA) SOLN 2 mg    2. Retinal edema  H35.81 OCT, Retina - OU - Both Eyes    3. Posterior vitreous detachment of both eyes  H43.813     4. Pseudophakia of both eyes  Z96.1     5. Ocular hypertension, bilateral  H40.053     6. Dry eyes  H04.123      1,2. Exudative age related macular degeneration, both eyes.    - severe exudative disease with very active CNVM OU at presentation in January 2019  - S/P IVA OD #1 (01.04.19), #2 (02.15.19)  - S/P IVA OS #1 (01.18.19), #2 (02.15.19)  - switched to Eylea 3.18.19 due to severity of disease  - S/P IVE OD #1 (03.18.19), #2 (04.17.19), #3 (05.15.19), #4 (10.02.19),  #5 (01.15.20), #6 (06.04.20), #7 (10.29.20), #8 (01.21.21), #9 (05.27.21), #10 (07.22.21) -- injections held due to stable disciform scar  - S/P IVE OS #1 (03.18.19), #2 (04.17.19), #3 (05.15.19), #4 (06.12.19), #5 (07.10.19), #6 (08.07.19), #7 (09.04.19), #8 (10.02.19), #9 (11.06.19), #10 (12.11.19), #11 (01.15.20), #12 (02.20.20), #13 (03.26.20), #14 (04.30.20), #15 (06.04.20), #16 (07.16.20), # 17 (08.20.20), #18 (09.24.20), #19 (10.29.20), #20 (12.03.20), #21 (01.21.21), #22 (03.18.21), #23 (05.27.21), #24 (07.22.21),  #25 (11.11.21), #26 (12.09.21), #27 (01.06.22), #28 (2.7.22), #29 (03.24.22), #30 (4.28.22), #31 (05.31.22), #32 (06.30.22), #33 (08.04.22), #34 (09.15.22), #35 (10.27.22)  - IVE OS held from 7.22.21 to 11.11.21 due to TIA/stroke on 8.1.21  - OCT OS: persistent cystic changes overlying PED/SRHM at 6 wks  - exam OS shows faint IRH nasal fovea at 6 wks  - OD: Stable sub-retinal scar, mild persistent cystic changes overlying  - BCVA OD stable at CF 3'; OS 20/80 from 20/100   - recommend IVE OS #36 today, 12.08.22 w/ f/u dec to 5 wks - will cont to hold OD  - Eylea informed consent form signed and scanned on 01.21.2021  - Eylea4U paperwork filled out on 02.15.19 and fully approved through [employer] -- approved  - f/u in 5 weeks, sooner prn for DFE/OCT/possible injection(s)  3. PVD / vitreous syneresis  - Discussed findings and prognosis   - No RT or RD on 360 exam  - Reviewed s/s of RT/RD  - Strict return precautions for any such RT/RD signs/symptoms  4. Pseudophakia OU  - s/p CE/IOL (Dr. Marisa Hua, 11.2020)  - beautiful surgeries w/ IOLs in excellent position, doing well  - monitor   5. Ocular Hypertension OU  - IOP 20,19  - pt has not increased Cosopt to BID -- still using qd  - inc Cosopt to  BID OU  6. Dry eyes OU - recommend artificial tears and lubricating ointment as needed  Ophthalmic Meds Ordered this visit:  Meds ordered this encounter  Medications   aflibercept (EYLEA) SOLN 2 mg      Return in about 5 weeks (around 12/16/2021) for f/u exu ARMD OS, DFE, OCT.  There are no Patient Instructions on file for this visit.  This document serves as a record of services personally performed by Gardiner Sleeper, MD, PhD. It was created on their behalf by San Jetty. Owens Shark, OA an ophthalmic technician. The creation of this record is the provider's dictation and/or activities during the visit.    Electronically signed by: San Jetty. Marguerita Merles 12.02.2022 8:51 PM   Gardiner Sleeper,  M.D., Ph.D. Diseases & Surgery of the Retina and Vitreous [facility]  I have reviewed the above documentation for accuracy and completeness, and I agree with the above. Gardiner Sleeper, M.D., Ph.D. 11/11/21 8:51 PM   Abbreviations: M myopia (nearsighted); A astigmatism; H hyperopia (farsighted); P presbyopia; Mrx spectacle prescription;  CTL contact lenses; OD right eye; OS left eye; OU both eyes  XT exotropia; ET esotropia; PEK punctate epithelial keratitis; PEE punctate epithelial erosions; DES dry eye syndrome; MGD meibomian gland dysfunction; ATs artificial tears; PFAT's preservative free artificial tears; [place] nuclear sclerotic cataract; PSC posterior subcapsular cataract; ERM epi-retinal membrane; PVD posterior vitreous detachment; RD retinal detachment; DM diabetes mellitus; DR diabetic retinopathy; NPDR non-proliferative diabetic retinopathy; PDR proliferative diabetic retinopathy; CSME clinically significant macular edema; DME diabetic macular edema; dbh dot blot hemorrhages; CWS cotton wool spot; POAG primary open angle glaucoma; C/D cup-to-disc ratio; HVF humphrey visual field; GVF goldmann visual field; OCT optical coherence tomography; IOP intraocular pressure; BRVO Branch retinal vein occlusion; CRVO central retinal vein occlusion; CRAO central retinal artery occlusion; BRAO branch retinal artery occlusion; RT retinal tear; SB scleral buckle; PPV pars plana vitrectomy; VH Vitreous hemorrhage; PRP panretinal laser photocoagulation; IVK intravitreal kenalog; VMT vitreomacular traction; MH Macular hole;  NVD neovascularization of the disc; NVE neovascularization elsewhere; AREDS age related eye disease study; ARMD age related macular degeneration; POAG primary open angle glaucoma; EBMD epithelial/anterior basement membrane dystrophy; ACIOL anterior chamber intraocular lens; IOL intraocular lens; PCIOL posterior chamber intraocular lens; Phaco/IOL phacoemulsification with  intraocular lens placement; [place] photorefractive keratectomy; LASIK laser assisted in situ keratomileusis; HTN hypertension; DM diabetes mellitus; COPD chronic obstructive pulmonary disease

## 2021-11-11 ENCOUNTER — Other Ambulatory Visit: Payer: Self-pay

## 2021-11-11 ENCOUNTER — Encounter (INDEPENDENT_AMBULATORY_CARE_PROVIDER_SITE_OTHER): Payer: Self-pay | Admitting: Ophthalmology

## 2021-11-11 ENCOUNTER — Ambulatory Visit (INDEPENDENT_AMBULATORY_CARE_PROVIDER_SITE_OTHER): Payer: Medicare HMO | Admitting: Ophthalmology

## 2021-11-11 DIAGNOSIS — H40053 Ocular hypertension, bilateral: Secondary | ICD-10-CM | POA: Diagnosis not present

## 2021-11-11 DIAGNOSIS — H353231 Exudative age-related macular degeneration, bilateral, with active choroidal neovascularization: Secondary | ICD-10-CM | POA: Diagnosis not present

## 2021-11-11 DIAGNOSIS — H43813 Vitreous degeneration, bilateral: Secondary | ICD-10-CM | POA: Diagnosis not present

## 2021-11-11 DIAGNOSIS — H04123 Dry eye syndrome of bilateral lacrimal glands: Secondary | ICD-10-CM | POA: Diagnosis not present

## 2021-11-11 DIAGNOSIS — Z961 Presence of intraocular lens: Secondary | ICD-10-CM | POA: Diagnosis not present

## 2021-11-11 DIAGNOSIS — H3581 Retinal edema: Secondary | ICD-10-CM

## 2021-11-11 MED ORDER — AFLIBERCEPT 2MG/0.05ML IZ SOLN FOR KALEIDOSCOPE
2.0000 mg | INTRAVITREAL | Status: AC | PRN
  Administered 2021-11-11: 2 mg via INTRAVITREAL

## 2021-11-15 NOTE — Progress Notes (Signed)
Gabrielle [name], [place] [address]   CLINIC:  Medical Oncology/Hematology  PCP:  Janora Norlander, DO [address] [place] [address] [phone]   REASON FOR VISIT:  Follow-up for right colon cancer  PRIOR THERAPY:  1. Right hemicolectomy on 04/08/2020. 2. FOLFOX x 3 cycles from 05/19/2020 to 07/14/2020.  NGS Results: Foundation 1 KRAS/NRAS wild-type, MSI--high, TMB 55 Muts/Mb, BRAF V 600 E  CURRENT THERAPY: Keytruda every 3 weeks  BRIEF ONCOLOGIC HISTORY:  Oncology History  Malignant neoplasm of colon ([place])  04/07/2020 Initial Diagnosis   Malignant neoplasm of ascending colon ([place])   05/13/2020 Genetic Testing   Foundation One     05/19/2020 - 07/02/2020 Chemotherapy   The patient had palonosetron (ALOXI) injection 0.25 mg, 0.25 mg, Intravenous,  Once, 2 of 12 cycles Administration: 0.25 mg (05/19/2020), 0.25 mg (06/30/2020) leucovorin 522 mg in dextrose 5 % 250 mL infusion, 320 mg/m2 = 522 mg (80 % of original dose 400 mg/m2), Intravenous,  Once, 2 of 12 cycles Dose modification: 320 mg/m2 (80 % of original dose 400 mg/m2, Cycle 1, Reason: Provider Judgment), 174.9449 mg/m2 (66.7 % of original dose 400 mg/m2, Cycle 2, Reason: Provider Judgment) Administration: 522 mg (05/19/2020), 434 mg (06/30/2020) oxaliplatin (ELOXATIN) 110 mg in dextrose 5 % 500 mL chemo infusion, 68 mg/m2 = 110 mg (80 % of original dose 85 mg/m2), Intravenous,  Once, 1 of 1 cycle Dose modification: 68 mg/m2 (80 % of original dose 85 mg/m2, Cycle 1, Reason: Provider Judgment) Administration: 110 mg (05/19/2020) fluorouracil (ADRUCIL) chemo injection 500 mg, 320 mg/m2 = 500 mg (80 % of original dose 400 mg/m2), Intravenous,  Once, 2 of 12 cycles Dose modification: 320 mg/m2 (80 % of original dose 400 mg/m2, Cycle 1, Reason: Provider Judgment), 675.9163 mg/m2 (66.7 % of original dose 400 mg/m2, Cycle 2, Reason: Provider Judgment) Administration: 500 mg (05/19/2020), 450  mg (06/30/2020) fluorouracil (ADRUCIL) 3,150 mg in sodium chloride 0.9 % 87 mL chemo infusion, 1,920 mg/m2 = 3,150 mg (80 % of original dose 2,400 mg/m2), Intravenous, 1 Day/Dose, 2 of 12 cycles Dose modification: 1,920 mg/m2 (80 % of original dose 2,400 mg/m2, Cycle 1, Reason: Provider Judgment), 1,600 mg/m2 (66.7 % of original dose 2,400 mg/m2, Cycle 2, Reason: Provider Judgment) Administration: 3,150 mg (05/19/2020), 2,600 mg (06/30/2020)   for chemotherapy treatment.     08/03/2020 Genetic Testing   Guardant Reveal Testing     08/14/2020 Genetic Testing   No pathogenic variants detected in [facility] Common Hereditary Cancers Panel.  Variant of uncertain significance (VUS) detected in HOXB13 at c.634G>A (p.Ala212Thr). The Common Hereditary Cancers Panel offered by [facility] includes sequencing and/or deletion duplication testing of the following 48 genes: APC, ATM, AXIN2, BARD1, BMPR1A, BRCA1, BRCA2, BRIP1, CDH1, CDK4, CDKN2A (p14ARF), CDKN2A (p16INK4a), CHEK2, CTNNA1, DICER1, EPCAM (Deletion/duplication testing only), FLCN, GREM1 (promoter region deletion/duplication testing only), KIT, MEN1, MLH1, MSH2, MSH3, MSH6, MUTYH, NBN, NF1, NHTL1, PALB2, PDGFRA, PMS2, POLD1, POLE, PTEN, RAD50, RAD51C, RAD51D, RNF43, SDHB, SDHC, SDHD, SMAD4, SMARCA4. STK11, TP53, TSC1, TSC2, and VHL.  The following genes were evaluated for sequence changes only: SDHA and HOXB13 c.251G>A variant only. The report date is August 14, 2020.    11/05/2020 Cancer Staging   Staging form: Colon and Rectum, AJCC 8th Edition - Pathologic stage from 11/05/2020: Stage IVC (rpTX, pN0, pM1c) - Signed by Derek Jack, MD on 11/05/2020    11/10/2020 -  Chemotherapy   Patient is on Treatment Plan :  COLORECTAL Pembrolizumab q21d       CANCER STAGING:  Cancer Staging  Malignant neoplasm of colon [facility]) Staging form: Colon and Rectum, AJCC 8th Edition - Clinical stage from 04/27/2020: Stage IIIB (cT4a, cN1a, cM0) - Unsigned -  Pathologic stage from 11/05/2020: Stage IVC (rpTX, pN0, pM1c) - Signed by Derek Jack, MD on 11/05/2020   INTERVAL HISTORY:  Gabrielle [name], a 75 y.o. female, returns for routine follow-up and consideration for next cycle of chemotherapy. Gabrielle [name] was last seen on 10/04/2021.  Due for cycle #15 of Keytruda today.   Overall, she tells me she has been feeling pretty well. She reports pain in her right scapula region over the past week; the pain is worsened with movement. She denies diarrhea, and her cough has improved.   Overall, she feels ready for next cycle of chemo today.   REVIEW OF SYSTEMS:  Review of Systems  Constitutional:  Negative for appetite change and fatigue.  Respiratory:  Negative for cough (improved).   Gastrointestinal:  Negative for diarrhea.  Musculoskeletal:  Positive for back pain (5/10 R scapula).  All other systems reviewed and are negative.  PAST MEDICAL/SURGICAL HISTORY:  Past Medical History:  Diagnosis Date   Arthritis    Cancer [facility])    Cataract    OU   Colon cancer ([place])    Family history of pancreatic cancer 08/21/2020   Family history of uterine cancer 08/21/2020   H/O cesarean section    Hx of tonsillectomy    Hyperlipidemia    Hypertension    Macular degeneration    Exu ARMD OU   Paroxysmal atrial fibrillation ([place]) 07/05/2020   Past Surgical History:  Procedure Laterality Date   BIOPSY  04/06/2020   Procedure: BIOPSY;  Surgeon: Rogene Houston, MD;  Location: [facility];  Service: Endoscopy;;   CARPAL TUNNEL RELEASE Right    CATARACT EXTRACTION W/PHACO Left 10/11/2019   Procedure: CATARACT EXTRACTION PHACO AND INTRAOCULAR LENS PLACEMENT ([place]);  Surgeon: Baruch Goldmann, MD;  Location: [facility] ORS;  Service: Ophthalmology;  Laterality: Left;  CDE: 8.56   CATARACT EXTRACTION W/PHACO Right 10/25/2019   Procedure: CATARACT EXTRACTION PHACO AND INTRAOCULAR LENS PLACEMENT ([facility]);  Surgeon: Baruch Goldmann, MD;  Location: [facility] ORS;  Service:  Ophthalmology;  Laterality: Right;  CDE: 5.67   CESAREAN SECTION     COLONOSCOPY N/A 04/06/2020   Procedure: COLONOSCOPY;  Surgeon: Rogene Houston, MD;  Location: [facility] SU[facility];  Service: Endoscopy;  Laterality: N/A;   CYSTOSCOPY WITH BIOPSY N/A 04/08/2020   Procedure: CYSTOSCOPY WITH BIOPSY;  Surgeon: Cleon Gustin, MD;  Location: [facility] ORS;  Service: Urology;  Laterality: N/A;   ESOPHAGOGASTRODUODENOSCOPY N/A 04/05/2020   Procedure: ESOPHAGOGASTRODUODENOSCOPY (EGD);  Surgeon: Rogene Houston, MD;  Location: [facility];  Service: Endoscopy;  Laterality: N/A;   PARTIAL COLECTOMY N/A 04/08/2020   Procedure: PARTIAL COLECTOMY;  Surgeon: Virl Cagey, MD;  Location: [facility] ORS;  Service: General;  Laterality: N/A;   PORTACATH PLACEMENT Left 05/18/2020   Procedure: INSERTION PORT-A-CATH (ATTACHED CATHETER IN LEFT SUBCLAVIAN);  Surgeon: Virl Cagey, MD;  Location: [facility] ORS;  Service: General;  Laterality: Left;   TONSILLECTOMY      SOCIAL HISTORY:  Social History   Socioeconomic History   Marital status: Divorced    Spouse name: Not on file   Number of children: Not on file   Years of education: Not on file   Highest education level: Not on file  Occupational History  Not on file  Tobacco Use   Smoking status: Never   Smokeless tobacco: Never  Vaping Use   Vaping Use: Never used  Substance and Sexual Activity   Alcohol use: No   Drug use: No   Sexual activity: Not Currently  Other Topics Concern   Not on file  Social History Narrative   Not on file   Social Determinants of Health   Financial Resource Strain: Not on file  Food Insecurity: Not on file  Transportation Needs: Not on file  Physical Activity: Not on file  Stress: Not on file  Social Connections: Not on file  Intimate Partner Violence: Not on file    FAMILY HISTORY:  Family History  Problem Relation Age of Onset   Macular degeneration Mother    Stroke Mother    Hypertension Mother    Dementia  Mother    Lung cancer Father        dx late 43s; smoking hx   Diabetes Sister    Hypertension Sister    Leukemia Paternal Uncle        d. 42s   Cancer Maternal Aunt        ovarian or endometrial dx 88s   Pancreatic cancer Paternal Uncle        d. late 43s    CURRENT MEDICATIONS:  Current Outpatient Medications  Medication Sig Dispense Refill   alendronate (FOSAMAX) 70 MG tablet Take 1 tablet (70 mg total) by mouth every 7 (seven) days. Take with a full glass of water on an empty stomach. 12 tablet 3   amLODipine (NORVASC) 5 MG tablet Take 1 tablet (5 mg total) by mouth daily. 90 tablet 3   atorvastatin (LIPITOR) 20 MG tablet Take 1 tablet (20 mg total) by mouth daily. 90 tablet 3   cholecalciferol (VITAMIN D3) 25 MCG (1000 UT) tablet Take 1,000 Units by mouth daily.     CRANBERRY FRUIT PO Take 1 tablet by mouth daily.     diclofenac Sodium (VOLTAREN) 1 % GEL APPLY 4 GRAMS TO AFFECTED AREA(S) 4 TIMES A DAY 400 g 2   dorzolamide-timolol (COSOPT) 22.3-6.8 MG/ML ophthalmic solution PLACE 1 DROP INTO BOTH EYES 2 TIMES A DAY (Patient taking differently: Place 1 drop into both eyes 1 day or 1 dose.) 10 mL 0   dorzolamide-timolol (COSOPT) 22.3-6.8 MG/ML ophthalmic solution Place 1 drop into both eyes 2 (two) times daily. 10 mL 3   ELIQUIS 5 MG TABS tablet Take 1 tablet (5 mg total) by mouth 2 (two) times daily. 60 tablet 6   FEROSUL 325 (65 Fe) MG tablet TAKE  (1)  TABLET TWICE A DAY WITH MEALS (BREAKFAST AND SUPPER) 960 tablet 0   Garlic 454 MG TABS Take by mouth.     lidocaine-prilocaine (EMLA) cream Apply a small amount to port a cath site and cover with plastic wrap 1 hour prior to chemotherapy appointments 30 g 0   losartan-hydrochlorothiazide (HYZAAR) 100-25 MG tablet Take 1 tablet by mouth daily. 90 tablet 3   potassium chloride SA (KLOR-CON) 20 MEQ tablet Take 1 tablet (20 mEq total) by mouth 2 (two) times daily. 180 tablet 1   prochlorperazine (COMPAZINE) 10 MG tablet Take 1 tablet (10  mg total) by mouth every 6 (six) hours as needed for nausea or vomiting. 30 tablet 0   No current facility-administered medications for this visit.   Facility-Administered Medications Ordered in Other Visits  Medication Dose Route Frequency Provider Last Rate Last Admin  influenza vaccine adjuvanted (FLUAD) injection 0.5 mL  0.5 mL Intramuscular Once Derek Jack, MD        ALLERGIES:  Allergies  Allergen Reactions   Lisinopril Cough    PHYSICAL EXAM:  Performance status (ECOG): 1 - Symptomatic but completely ambulatory  Vitals:   11/16/21 1218  BP: (!) 171/60  Pulse: 83  Resp: 18  Temp: 98.2 F (36.8 C)  SpO2: 100%   Wt Readings from Last 3 Encounters:  11/16/21 173 lb 4.8 oz (78.6 kg)  10/04/21 173 lb 3.2 oz (78.6 kg)  09/14/21 168 lb 12.8 oz (76.6 kg)   Physical Exam Vitals reviewed.  Constitutional:      Appearance: Normal appearance.  Cardiovascular:     Rate and Rhythm: Normal rate and regular rhythm.     Pulses: Normal pulses.     Heart sounds: Normal heart sounds.  Pulmonary:     Effort: Pulmonary effort is normal.     Breath sounds: Normal breath sounds.  Neurological:     General: No focal deficit present.     Mental Status: She is alert and oriented to person, place, and time.  Psychiatric:        Mood and Affect: Mood normal.        Behavior: Behavior normal.    LABORATORY DATA:  I have reviewed the labs as listed.  CBC Latest Ref Rng & Units 11/16/2021 10/04/2021 09/14/2021  WBC 4.0 - 10.5 K/uL 5.7 6.5 6.4  Hemoglobin 12.0 - 15.0 g/dL 12.9 13.2 13.1  Hematocrit 36.0 - 46.0 % 38.7 39.0 38.9  Platelets 150 - 400 K/uL 124(L) 139(L) 138(L)   CMP Latest Ref Rng & Units 11/16/2021 10/04/2021 09/14/2021  Glucose 70 - 99 mg/dL 158(H) 118(H) 109(H)  BUN 8 - 23 mg/dL _0 Creatinine 0.44 - 1.00 mg/dL 1.08(H) 1.08(H) 1.06(H)  Sodium 135 - 145 mmol/L 137 137 136  Potassium 3.5 - 5.1 mmol/L 3.3(L) 4.1 3.8  Chloride 98 - 111 mmol/L 100  97(L) 97(L)  CO2 22 - 32 mmol/L _1 Calcium 8.9 - 10.3 mg/dL 9.7 9.7 9.7  Total Protein 6.5 - 8.1 g/dL 7.5 7.8 7.6  Total Bilirubin 0.3 - 1.2 mg/dL 0.4 0.8 0.7  Alkaline Phos 38 - 126 U/L 62 75 72  AST 15 - 41 U/L _2 ALT 0 - 44 U/L 23 32 20    DIAGNOSTIC IMAGING:  I have independently reviewed the scans and discussed with the patient. Intravitreal Injection, Pharmacologic Agent - OS - Left Eye  Result Date: 11/11/2021 Time Out 11/11/2021. 9:04 AM. Confirmed correct patient, procedure, site, and patient consented. Anesthesia Topical anesthesia was used. Anesthetic medications included Lidocaine 2%, Proparacaine 0.5%. Procedure Preparation included 5% betadine to ocular surface, eyelid speculum. A (32g) needle was used. Injection: 2 mg aflibercept 2 MG/0.05ML   Route: Intravitreal, Site: Left Eye   NDC: [id number], Lot: [id number], Expiration date: 10/04/2022, Waste: 0.05 mL Post-op Post injection exam found visual acuity of at least counting fingers. The patient tolerated the procedure well. There were no complications. The patient received written and verbal post procedure care education. Post injection medications were not given.   OCT, Retina - OU - Both Eyes  Result Date: 11/11/2021 Right Eye Quality was good. Central Foveal Thickness: 535. Progression has been stable. Findings include pigment epithelial detachment, epiretinal membrane, subretinal hyper-reflective material, retinal drusen , abnormal foveal contour, no SRF, intraretinal fluid, disciform scar, outer retinal tubulation, outer retinal atrophy (Stable  sub-retinal scar, mild persistent cystic changes overlying). Left Eye Quality was good. Central Foveal Thickness: 401. Progression has been stable. Findings include subretinal hyper-reflective material, pigment epithelial detachment, intraretinal fluid, retinal drusen , outer retinal atrophy, normal foveal contour, no SRF (persistent cystic changes overlying PED/SRHM).  Notes *Images captured and stored on drive Diagnosis / Impression: Exudative ARMD OU OD: Stable sub-retinal scar, mild persistent cystic changes overlying OS: persistent cystic changes overlying PED/SRHM Clinical management: See below Abbreviations: NFP - Normal foveal profile. CME - cystoid macular edema. PED - pigment epithelial detachment. IRF - intraretinal fluid. SRF - subretinal fluid. EZ - ellipsoid zone. ERM - epiretinal membrane. ORA - outer retinal atrophy. ORT - outer retinal tubulation. SRHM - subretinal hyper-reflective material     ASSESSMENT:  1.  Stage IIIb (T4AN1A) poorly differentiated right colon adenocarcinoma: -Right hemicolectomy on 04/07/2020 with poorly differentiated adenocarcinoma, pT4a, positive radial margin, 1/15 lymph nodes positive, loss of MLH1 and PMS2, bladder biopsy negative for malignancy. -CTAP on 04/06/2020 showed circumferential ascending colon mass with numerous borderline enlarged pericolonic lymph nodes.  No findings of hepatic metastatic disease. -CEA on 04/04/2020 was 12.2.  CEA improved to 1.7 on 05/13/2020. -PET scan on 05/11/2020 shows mild FDG uptake associated with the anastomotic site.  Small pulmonary nodules that do not show elevated FDG activity, remain nonspecific, subcentimeter.  Persistent but improved bladder wall thickening. -Cycle 1 of dose reduced FOLFOX on 05/19/2020, followed by 2 hospitalizations, 1 from acute kidney injury from diarrhea and second admission for C. difficile colitis. -Cycle 2 of 5-FU and leucovorin dose reduced on 06/30/2020.  Chemotherapy discontinued secondary to intolerance. -Follow-up CT scan on 11/02/2020 with multiple soft tissue lesions in the central and right mesentery measuring up to 3.1 x 1.7 cm.  Mild lymphadenopathy in the hepatic duodenal ligament, retroperitoneal space, right common iliac chain, bilateral external iliac chains.  17 mm soft tissue nodule in the lower anterior abdominal wall.  Bilateral tiny pulmonary nodules  not substantially changed.  2.9 x 2.5 cm low-density lesion along the posterior uterus is indeterminate. -Pembrolizumab started on 11/10/2020. -CT CAP from 02/09/2021 showed interval near complete resolution of previously demonstrated nodal mass in the ileocolonic mesentery.  Residual ill-defined nodule medial to the tip of the right hepatic lobe.  Stable small pulmonary nodules bilaterally consistent with benign findings.   2.  Family history: -Paternal uncle had colon cancer.  Maternal aunt had brain cancer and another maternal aunt had gynecological malignancy.  Father had lung cancer and was a smoker. - Genetic testing did not reveal any notable mutations.   3.  Diffuse erythema and nodularity of the dome of the bladder: -CT scan showed severely thickened bladder wall. -Cystoscopy on 04/08/2020 showed diffuse erythema, nodularity in the posterior wall tracking to the dome.  Ureteral orifices were in normal locations.  Biopsies were benign.   PLAN:  1.  Stage IV right colon adenocarcinoma, MSI-high: - CT CAP on 09/29/2021 showed no new or progressive findings.  Soft tissue lesion adjacent to the ileocolic anastomosis is unchanged.  Scattered tiny bilateral lung nodules are stable. - She reports right scapular pain since last week after she had coughing spells from upper respiratory infection.  She is taking Motrin.  Its not helping completely.  We will give her tramadol 50 mg twice daily as needed #30. She denies any immunotherapy related side effects. - Reviewed labs which showed normal LFTs and creatinine is mildly elevated at 1.08.  CBC was grossly normal.  Mild thrombocytopenia of  124 is stable.  TSH is 1.3. - We will proceed with Keytruda today and in 3 weeks.  RTC 6 weeks for follow-up. - If the pain is not better in 3 weeks, will consider imaging of the spine.   2.  Paroxysmal atrial fibrillation: - Continue Eliquis.  No bleeding issues reported.   3.  Hypertension: - Continue Hyzaar  and amlodipine.  4.  Hypokalemia: - Continue potassium 40 mEq daily.  Potassium today is 3.3.   Orders placed this encounter:  No orders of the defined types were placed in this encounter.    Derek Jack, MD [place] [phone]   I, Thana Ates, am acting as a scribe for Dr. Derek Jack.  I, Derek Jack MD, have reviewed the above documentation for accuracy and completeness, and I agree with the above.

## 2021-11-16 ENCOUNTER — Other Ambulatory Visit (HOSPITAL_COMMUNITY): Payer: Self-pay | Admitting: *Deleted

## 2021-11-16 ENCOUNTER — Inpatient Hospital Stay (HOSPITAL_BASED_OUTPATIENT_CLINIC_OR_DEPARTMENT_OTHER): Payer: Medicare HMO | Admitting: Hematology

## 2021-11-16 ENCOUNTER — Inpatient Hospital Stay (HOSPITAL_COMMUNITY): Payer: Medicare HMO | Attending: Hematology

## 2021-11-16 ENCOUNTER — Other Ambulatory Visit: Payer: Self-pay

## 2021-11-16 ENCOUNTER — Inpatient Hospital Stay (HOSPITAL_COMMUNITY): Payer: Medicare HMO

## 2021-11-16 ENCOUNTER — Encounter (HOSPITAL_COMMUNITY): Payer: Self-pay | Admitting: Hematology

## 2021-11-16 VITALS — BP 171/60 | HR 83 | Temp 98.2°F | Resp 18 | Ht 61.5 in | Wt 173.3 lb

## 2021-11-16 VITALS — BP 165/69 | HR 67 | Temp 98.3°F | Resp 16

## 2021-11-16 DIAGNOSIS — C189 Malignant neoplasm of colon, unspecified: Secondary | ICD-10-CM

## 2021-11-16 DIAGNOSIS — C182 Malignant neoplasm of ascending colon: Secondary | ICD-10-CM | POA: Insufficient documentation

## 2021-11-16 DIAGNOSIS — Z23 Encounter for immunization: Secondary | ICD-10-CM | POA: Insufficient documentation

## 2021-11-16 DIAGNOSIS — Z79899 Other long term (current) drug therapy: Secondary | ICD-10-CM | POA: Insufficient documentation

## 2021-11-16 DIAGNOSIS — Z5112 Encounter for antineoplastic immunotherapy: Secondary | ICD-10-CM | POA: Diagnosis not present

## 2021-11-16 LAB — CBC WITH DIFFERENTIAL/PLATELET
Abs Immature Granulocytes: 0.04 10*3/uL (ref 0.00–0.07)
Basophils Absolute: 0 10*3/uL (ref 0.0–0.1)
Basophils Relative: 0 %
Eosinophils Absolute: 0.2 10*3/uL (ref 0.0–0.5)
Eosinophils Relative: 3 %
HCT: 38.7 % (ref 36.0–46.0)
Hemoglobin: 12.9 g/dL (ref 12.0–15.0)
Immature Granulocytes: 1 %
Lymphocytes Relative: 22 %
Lymphs Abs: 1.2 10*3/uL (ref 0.7–4.0)
MCH: 31.7 pg (ref 26.0–34.0)
MCHC: 33.3 g/dL (ref 30.0–36.0)
MCV: 95.1 fL (ref 80.0–100.0)
Monocytes Absolute: 0.5 10*3/uL (ref 0.1–1.0)
Monocytes Relative: 8 %
Neutro Abs: 3.8 10*3/uL (ref 1.7–7.7)
Neutrophils Relative %: 66 %
Platelets: 124 10*3/uL — ABNORMAL LOW (ref 150–400)
RBC: 4.07 MIL/uL (ref 3.87–5.11)
RDW: 13.2 % (ref 11.5–15.5)
WBC: 5.7 10*3/uL (ref 4.0–10.5)
nRBC: 0 % (ref 0.0–0.2)

## 2021-11-16 LAB — COMPREHENSIVE METABOLIC PANEL
ALT: 23 U/L (ref 0–44)
AST: 28 U/L (ref 15–41)
Albumin: 4.2 g/dL (ref 3.5–5.0)
Alkaline Phosphatase: 62 U/L (ref 38–126)
Anion gap: 8 (ref 5–15)
BUN: 21 mg/dL (ref 8–23)
CO2: 29 mmol/L (ref 22–32)
Calcium: 9.7 mg/dL (ref 8.9–10.3)
Chloride: 100 mmol/L (ref 98–111)
Creatinine, Ser: 1.08 mg/dL — ABNORMAL HIGH (ref 0.44–1.00)
GFR, Estimated: 54 mL/min — ABNORMAL LOW (ref >60)
Glucose, Bld: 158 mg/dL — ABNORMAL HIGH (ref 70–99)
Potassium: 3.3 mmol/L — ABNORMAL LOW (ref 3.5–5.1)
Sodium: 137 mmol/L (ref 135–145)
Total Bilirubin: 0.4 mg/dL (ref 0.3–1.2)
Total Protein: 7.5 g/dL (ref 6.5–8.1)

## 2021-11-16 LAB — TSH: TSH: 1.326 u[IU]/mL (ref 0.350–4.500)

## 2021-11-16 LAB — MAGNESIUM: Magnesium: 1.9 mg/dL (ref 1.7–2.4)

## 2021-11-16 MED ORDER — SODIUM CHLORIDE 0.9 % IV SOLN
200.0000 mg | Freq: Once | INTRAVENOUS | Status: AC
  Administered 2021-11-16: 200 mg via INTRAVENOUS
  Filled 2021-11-16: qty 8

## 2021-11-16 MED ORDER — SODIUM CHLORIDE 0.9 % IV SOLN: Freq: Once | INTRAVENOUS | Status: AC

## 2021-11-16 MED ORDER — INFLUENZA VAC A&B SA ADJ QUAD 0.5 ML IM PRSY
0.5000 mL | PREFILLED_SYRINGE | Freq: Once | INTRAMUSCULAR | Status: AC
  Administered 2021-11-16: 0.5 mL via INTRAMUSCULAR
  Filled 2021-11-16: qty 0.5

## 2021-11-16 MED ORDER — SODIUM CHLORIDE 0.9% FLUSH
10.0000 mL | INTRAVENOUS | Status: DC | PRN
  Administered 2021-11-16: 10 mL

## 2021-11-16 MED ORDER — HEPARIN SOD (PORK) LOCK FLUSH 100 UNIT/ML IV SOLN
500.0000 [IU] | Freq: Once | INTRAVENOUS | Status: AC | PRN
  Administered 2021-11-16: 500 [IU]

## 2021-11-16 NOTE — Patient Instructions (Addendum)
[place] at [facility] Discharge Instructions   You were seen and examined today by Dr. Delton Coombes. He reviewed your lab work which is normal/stable.  We will proceed with your treatment today.  Return as scheduled for lab work and treatment.     Thank you for choosing [place] at [facility] to provide your oncology and hematology care.  To afford each patient quality time with our provider, please arrive at least 15 minutes before your scheduled appointment time.   If you have a lab appointment with the [facility] please come in thru the Main Entrance and check in at the main information desk.  You need to re-schedule your appointment should you arrive 10 or more minutes late.  We strive to give you quality time with our providers, and arriving late affects you and other patients whose appointments are after yours.  Also, if you no show three or more times for appointments you may be dismissed from the clinic at the providers discretion.     Again, thank you for choosing [facility]  Our hope is that these requests will decrease the amount of time that you wait before being seen by our physicians.       _____________________________________________________________  Should you have questions after your visit to [facility], please contact our office at [phone] and follow the prompts.  Our office hours are 8:00 a.m. and 4:30 p.m. Monday - Friday.  Please note that voicemails left after 4:00 p.m. may not be returned until the following business day.  We are closed weekends and major holidays.  You do have access to a nurse 24-7, just call the main number to the clinic [phone] and do not press any options, hold on the line and a nurse will answer the phone.    For prescription refill requests, have your pharmacy contact our office and allow 72 hours.    Due to Covid, you will need to wear a mask upon  entering the hospital. If you do not have a mask, a mask will be given to you at the Main Entrance upon arrival. For doctor visits, patients may have 1 support person age 29 or older with them. For treatment visits, patients can not have anyone with them due to social distancing guidelines and our immunocompromised population.

## 2021-11-16 NOTE — Patient Instructions (Signed)
[place]  Discharge Instructions: Thank you for choosing [place] to provide your oncology and hematology care.  If you have a lab appointment with the [facility], please come in thru the Main Entrance and check in at the main information desk.  Wear comfortable clothing and clothing appropriate for easy access to any Portacath or PICC line.   We strive to give you quality time with your provider. You may need to reschedule your appointment if you arrive late (15 or more minutes).  Arriving late affects you and other patients whose appointments are after yours.  Also, if you miss three or more appointments without notifying the office, you may be dismissed from the clinic at the providers discretion.      For prescription refill requests, have your pharmacy contact our office and allow 72 hours for refills to be completed.    Today you received the following chemotherapy and/or immunotherapy agents Beryle Flock, flu shot given today      To help prevent nausea and vomiting after your treatment, we encourage you to take your nausea medication as directed.  BELOW ARE SYMPTOMS THAT SHOULD BE REPORTED IMMEDIATELY: *FEVER GREATER THAN 100.4 F (38 C) OR HIGHER *CHILLS OR SWEATING *NAUSEA AND VOMITING THAT IS NOT CONTROLLED WITH YOUR NAUSEA MEDICATION *UNUSUAL SHORTNESS OF BREATH *UNUSUAL BRUISING OR BLEEDING *URINARY PROBLEMS (pain or burning when urinating, or frequent urination) *BOWEL PROBLEMS (unusual diarrhea, constipation, pain near the anus) TENDERNESS IN MOUTH AND THROAT WITH OR WITHOUT PRESENCE OF ULCERS (sore throat, sores in mouth, or a toothache) UNUSUAL RASH, SWELLING OR PAIN  UNUSUAL VAGINAL DISCHARGE OR ITCHING   Items with * indicate a potential emergency and should be followed up as soon as possible or go to the Emergency Department if any problems should occur.  Please show the CHEMOTHERAPY ALERT CARD or IMMUNOTHERAPY ALERT CARD at check-in to  the Emergency Department and triage nurse.  Should you have questions after your visit or need to cancel or reschedule your appointment, please contact [facility] [phone]  and follow the prompts.  Office hours are 8:00 a.m. to 4:30 p.m. Monday - Friday. Please note that voicemails left after 4:00 p.m. may not be returned until the following business day.  We are closed weekends and major holidays. You have access to a nurse at all times for urgent questions. Please call the main number to the clinic [phone] and follow the prompts.  For any non-urgent questions, you may also contact your provider using MyChart. We now offer e-Visits for anyone 46 and older to request care online for non-urgent symptoms. For details visit [email or web address].   Also download the MyChart app! Go to the app store, search "MyChart", open the app, select [place], and log in with your MyChart username and password.  Due to Covid, a mask is required upon entering the hospital/clinic. If you do not have a mask, one will be given to you upon arrival. For doctor visits, patients may have 1 support person aged 45 or older with them. For treatment visits, patients cannot have anyone with them due to current Covid guidelines and our immunocompromised population.

## 2021-11-16 NOTE — Progress Notes (Signed)
Pt here for [place].  Okay for treatment per Dr [name]. Tolerated treatment well today without incidence.  AVS reviewed.  Vital signs stable prior to discharge.  Discharged in stable condition ambulatory.

## 2021-11-16 NOTE — Progress Notes (Signed)
Patient has been examined, vital signs and labs have been reviewed by Dr. Katragadda. ANC, Creatinine, LFTs, hemoglobin, and platelets are within treatment parameters per Dr. Katragadda. Patient may proceed with treatment per M.D.   

## 2021-11-17 ENCOUNTER — Encounter (HOSPITAL_COMMUNITY): Payer: Self-pay | Admitting: Hematology

## 2021-11-17 LAB — CEA: CEA: 1.7 ng/mL (ref 0.0–4.7)

## 2021-11-17 MED ORDER — TRAMADOL HCL 50 MG PO TABS: 50.0000 mg | ORAL_TABLET | Freq: Two times a day (BID) | ORAL | 0 refills | Status: DC | PRN

## 2021-11-21 ENCOUNTER — Other Ambulatory Visit: Payer: Self-pay

## 2021-12-03 ENCOUNTER — Encounter (HOSPITAL_COMMUNITY): Payer: Self-pay | Admitting: Hematology

## 2021-12-07 ENCOUNTER — Encounter: Payer: Self-pay | Admitting: Family Medicine

## 2021-12-07 ENCOUNTER — Ambulatory Visit (INDEPENDENT_AMBULATORY_CARE_PROVIDER_SITE_OTHER): Payer: Medicare HMO | Admitting: Family Medicine

## 2021-12-07 VITALS — BP 140/78 | HR 75 | Temp 98.3°F | Ht 61.5 in | Wt 170.6 lb

## 2021-12-07 DIAGNOSIS — E782 Mixed hyperlipidemia: Secondary | ICD-10-CM | POA: Diagnosis not present

## 2021-12-07 DIAGNOSIS — I129 Hypertensive chronic kidney disease with stage 1 through stage 4 chronic kidney disease, or unspecified chronic kidney disease: Secondary | ICD-10-CM | POA: Diagnosis not present

## 2021-12-07 DIAGNOSIS — R051 Acute cough: Secondary | ICD-10-CM | POA: Diagnosis not present

## 2021-12-07 DIAGNOSIS — N183 Chronic kidney disease, stage 3 unspecified: Secondary | ICD-10-CM

## 2021-12-07 MED ORDER — BENZONATATE 100 MG PO CAPS: 100.0000 mg | ORAL_CAPSULE | Freq: Three times a day (TID) | ORAL | 0 refills | Status: DC | PRN

## 2021-12-07 NOTE — Patient Instructions (Addendum)
Cough pills up to 3 times daily if needed HYDRATE with water Please have Dr Delton Coombes get your cholesterol and vitamin d levels with tomorrow's labs.  I have placed orders in our computer system for his lab to use.  It appears that you have a viral upper respiratory infection (cold).  Cold symptoms can last up to 2 weeks.    - Get plenty of rest and drink plenty of fluids. - Try to breathe moist air. Use a cold mist humidifier. - Consume warm fluids (soup or tea) to provide relief for a stuffy nose and to loosen phlegm. - For nasal stuffiness, try saline nasal spray or a Neti Pot. Afrin nasal spray can also be used but this product should not be used longer than 3 days or it will cause rebound nasal stuffiness (worsening nasal congestion). - For sore throat pain relief: use chloraseptic spray, suck on throat lozenges, hard candy or popsicles; gargle with warm salt water (1/4 tsp. salt per 8 oz. of water); and eat soft, bland foods. - Eat a well-balanced diet. If you cannot, ensure you are getting enough nutrients by taking a daily multivitamin. - Avoid dairy products, as they can thicken phlegm. - Avoid alcohol, as it impairs your bodys immune system.  CONTACT YOUR DOCTOR IF YOU EXPERIENCE ANY OF THE FOLLOWING: - High fever - Ear pain - Sinus-type headache - Unusually severe cold symptoms - Cough that gets worse while other cold symptoms improve - Flare up of any chronic lung problem, such as asthma - Your symptoms persist longer than 2 weeks

## 2021-12-07 NOTE — Progress Notes (Signed)
Subjective: CC: Hypertension with CKD PCP: Janora Norlander, DO ZOX:WRUEAV K Lucking is a 76 y.o. female presenting to clinic today for:  1.  CKD 3, hypertension Patient is compliant with medications.  Unsure if refill needed.  No chest pain, shortness of breath, edema  2.  Cough Patient reports that she has had a mild cough for about a week now.  Cough is "loose".  Denies any hemoptysis, fevers, shortness of breath or wheezing.  She reports mild rhinorrhea.  No known sick contacts.  Has been using over-the-counter cough and cold medication with minimal relief.   ROS: Per HPI  Allergies  Allergen Reactions   Lisinopril Cough   Past Medical History:  Diagnosis Date   Arthritis    Cancer [facility])    Cataract    OU   Colon cancer ([place])    Family history of pancreatic cancer 08/21/2020   Family history of uterine cancer 08/21/2020   H/O cesarean section    Hx of tonsillectomy    Hyperlipidemia    Hypertension    Macular degeneration    Exu [facility] OU   Paroxysmal atrial fibrillation ([place]) 07/05/2020    Current Outpatient Medications:    alendronate (FOSAMAX) 70 MG tablet, Take 1 tablet (70 mg total) by mouth every 7 (seven) days. Take with a full glass of water on an empty stomach., Disp: 12 tablet, Rfl: 3   amLODipine (NORVASC) 5 MG tablet, Take 1 tablet (5 mg total) by mouth daily., Disp: 90 tablet, Rfl: 3   atorvastatin (LIPITOR) 20 MG tablet, Take 1 tablet (20 mg total) by mouth daily., Disp: 90 tablet, Rfl: 3   cholecalciferol (VITAMIN D3) 25 MCG (1000 UT) tablet, Take 1,000 Units by mouth daily., Disp: , Rfl:    CRANBERRY FRUIT PO, Take 1 tablet by mouth daily., Disp: , Rfl:    diclofenac Sodium (VOLTAREN) 1 % GEL, APPLY 4 GRAMS TO AFFECTED AREA(S) 4 TIMES A DAY, Disp: 400 g, Rfl: 2   dorzolamide-timolol (COSOPT) 22.3-6.8 MG/ML ophthalmic solution, PLACE 1 DROP INTO BOTH EYES 2 TIMES A DAY (Patient taking differently: Place 1 drop into both eyes 1 day or 1 dose.), Disp: 10  mL, Rfl: 0   dorzolamide-timolol (COSOPT) 22.3-6.8 MG/ML ophthalmic solution, Place 1 drop into both eyes 2 (two) times daily., Disp: 10 mL, Rfl: 3   ELIQUIS 5 MG TABS tablet, Take 1 tablet (5 mg total) by mouth 2 (two) times daily., Disp: 60 tablet, Rfl: 6   FEROSUL 325 (65 Fe) MG tablet, TAKE  (1)  TABLET TWICE A DAY WITH MEALS (BREAKFAST AND SUPPER), Disp: 180 tablet, Rfl: 0   Garlic 409 MG TABS, Take by mouth., Disp: , Rfl:    lidocaine-prilocaine (EMLA) cream, Apply a small amount to port a cath site and cover with plastic wrap 1 hour prior to chemotherapy appointments, Disp: 30 g, Rfl: 0   losartan-hydrochlorothiazide (HYZAAR) 100-25 MG tablet, Take 1 tablet by mouth daily., Disp: 90 tablet, Rfl: 3   potassium chloride SA (KLOR-CON) 20 MEQ tablet, Take 1 tablet (20 mEq total) by mouth 2 (two) times daily., Disp: 180 tablet, Rfl: 1   prochlorperazine (COMPAZINE) 10 MG tablet, Take 1 tablet (10 mg total) by mouth every 6 (six) hours as needed for nausea or vomiting., Disp: 30 tablet, Rfl: 0   traMADol (ULTRAM) 50 MG tablet, Take 1 tablet (50 mg total) by mouth 2 (two) times daily as needed., Disp: 30 tablet, Rfl: 0 Social History   Socioeconomic  History   Marital status: Divorced    Spouse name: Not on file   Number of children: Not on file   Years of education: Not on file   Highest education level: Not on file  Occupational History   Not on file  Tobacco Use   Smoking status: Never   Smokeless tobacco: Never  Vaping Use   Vaping Use: Never used  Substance and Sexual Activity   Alcohol use: No   Drug use: No   Sexual activity: Not Currently  Other Topics Concern   Not on file  Social History Narrative   Not on file   Social Determinants of Health   Financial Resource Strain: Not on file  Food Insecurity: Not on file  Transportation Needs: Not on file  Physical Activity: Not on file  Stress: Not on file  Social Connections: Not on file  Intimate Partner Violence: Not on  file   Family History  Problem Relation Age of Onset   Macular degeneration Mother    Stroke Mother    Hypertension Mother    Dementia Mother    Lung cancer Father        dx late 72s; smoking hx   Diabetes Sister    Hypertension Sister    Leukemia Paternal Uncle        d. 22s   Cancer Maternal Aunt        ovarian or endometrial dx 60s   Pancreatic cancer Paternal Uncle        d. late 54s    Objective: Office vital signs reviewed. BP 140/78    Pulse 75    Temp 98.3 F (36.8 C)    Ht 5' 1.5" (1.562 m)    Wt 170 lb 9.6 oz (77.4 kg)    SpO2 95%    BMI 31.71 kg/m   Physical Examination:  General: Awake, alert, well nourished, No acute distress HEENT: Sclera white.  TMs intact.  No significant drainage from the nares. Cardio: regular rate and rhythm, S1S2 heard, no murmurs appreciated Pulm: clear to auscultation bilaterally, no wheezes, rhonchi or rales; normal work of breathing on room air   Assessment/ Plan: 76 y.o. female   Acute cough - Plan: Novel Coronavirus, NAA ([facility]), benzonatate (TESSALON PERLES) 100 MG capsule  Stage 3 chronic kidney disease due to benign hypertension ([place]) - Plan: VITAMIN D 25 Hydroxy (Vit-D Deficiency, Fractures)  Mixed hyperlipidemia - Plan: Lipid panel  Will check for COVID virus.  No evidence of bacterial infection at this time.  Tessalon Perles given.  Encouraged hydration, rest  Blood pressure controlled upon recheck.  Overdue for vitamin D and lipid panel.  Future orders have been placed.  I have asked her oncologist to make sure that these are added with tomorrow's labs.    No orders of the defined types were placed in this encounter.  No orders of the defined types were placed in this encounter.    Janora Norlander, DO [place] [phone]

## 2021-12-08 ENCOUNTER — Other Ambulatory Visit: Payer: Self-pay | Admitting: Family Medicine

## 2021-12-08 ENCOUNTER — Inpatient Hospital Stay (HOSPITAL_COMMUNITY): Payer: Medicare HMO | Attending: Hematology

## 2021-12-08 ENCOUNTER — Telehealth: Payer: Self-pay | Admitting: Family Medicine

## 2021-12-08 ENCOUNTER — Other Ambulatory Visit: Payer: Self-pay

## 2021-12-08 ENCOUNTER — Encounter (HOSPITAL_COMMUNITY): Payer: Self-pay

## 2021-12-08 ENCOUNTER — Ambulatory Visit (HOSPITAL_COMMUNITY)
Admission: RE | Admit: 2021-12-08 | Discharge: 2021-12-08 | Disposition: A | Discharge status: Home / Self Care | Payer: Medicare HMO | Source: Ambulatory Visit | Attending: Hematology | Admitting: Hematology

## 2021-12-08 ENCOUNTER — Inpatient Hospital Stay (HOSPITAL_COMMUNITY): Payer: Medicare HMO

## 2021-12-08 VITALS — BP 164/69 | HR 66 | Temp 97.0°F | Resp 18

## 2021-12-08 VITALS — HR 67 | Temp 97.7°F | Resp 18 | Ht 61.5 in | Wt 170.2 lb

## 2021-12-08 DIAGNOSIS — Z79899 Other long term (current) drug therapy: Secondary | ICD-10-CM | POA: Diagnosis not present

## 2021-12-08 DIAGNOSIS — Z95828 Presence of other vascular implants and grafts: Secondary | ICD-10-CM | POA: Insufficient documentation

## 2021-12-08 DIAGNOSIS — C189 Malignant neoplasm of colon, unspecified: Secondary | ICD-10-CM

## 2021-12-08 DIAGNOSIS — C182 Malignant neoplasm of ascending colon: Secondary | ICD-10-CM | POA: Diagnosis not present

## 2021-12-08 DIAGNOSIS — U071 COVID-19: Secondary | ICD-10-CM

## 2021-12-08 DIAGNOSIS — Z5112 Encounter for antineoplastic immunotherapy: Secondary | ICD-10-CM | POA: Diagnosis not present

## 2021-12-08 DIAGNOSIS — T82538A Leakage of other cardiac and vascular devices and implants, initial encounter: Secondary | ICD-10-CM | POA: Diagnosis not present

## 2021-12-08 DIAGNOSIS — Z452 Encounter for adjustment and management of vascular access device: Secondary | ICD-10-CM | POA: Insufficient documentation

## 2021-12-08 LAB — CBC WITH DIFFERENTIAL/PLATELET
Abs Immature Granulocytes: 0.01 10*3/uL (ref 0.00–0.07)
Basophils Absolute: 0 10*3/uL (ref 0.0–0.1)
Basophils Relative: 0 %
Eosinophils Absolute: 0.2 10*3/uL (ref 0.0–0.5)
Eosinophils Relative: 3 %
HCT: 38.3 % (ref 36.0–46.0)
Hemoglobin: 12.9 g/dL (ref 12.0–15.0)
Immature Granulocytes: 0 %
Lymphocytes Relative: 23 %
Lymphs Abs: 1.2 10*3/uL (ref 0.7–4.0)
MCH: 32.1 pg (ref 26.0–34.0)
MCHC: 33.7 g/dL (ref 30.0–36.0)
MCV: 95.3 fL (ref 80.0–100.0)
Monocytes Absolute: 0.5 10*3/uL (ref 0.1–1.0)
Monocytes Relative: 9 %
Neutro Abs: 3.4 10*3/uL (ref 1.7–7.7)
Neutrophils Relative %: 65 %
Platelets: 147 10*3/uL — ABNORMAL LOW (ref 150–400)
RBC: 4.02 MIL/uL (ref 3.87–5.11)
RDW: 13.2 % (ref 11.5–15.5)
WBC: 5.3 10*3/uL (ref 4.0–10.5)
nRBC: 0 % (ref 0.0–0.2)

## 2021-12-08 LAB — COMPREHENSIVE METABOLIC PANEL
ALT: 22 U/L (ref 0–44)
AST: 23 U/L (ref 15–41)
Albumin: 4.2 g/dL (ref 3.5–5.0)
Alkaline Phosphatase: 74 U/L (ref 38–126)
Anion gap: 8 (ref 5–15)
BUN: 20 mg/dL (ref 8–23)
CO2: 31 mmol/L (ref 22–32)
Calcium: 9.7 mg/dL (ref 8.9–10.3)
Chloride: 99 mmol/L (ref 98–111)
Creatinine, Ser: 1.08 mg/dL — ABNORMAL HIGH (ref 0.44–1.00)
GFR, Estimated: 54 mL/min — ABNORMAL LOW (ref >60)
Glucose, Bld: 104 mg/dL — ABNORMAL HIGH (ref 70–99)
Potassium: 4.1 mmol/L (ref 3.5–5.1)
Sodium: 138 mmol/L (ref 135–145)
Total Bilirubin: 0.3 mg/dL (ref 0.3–1.2)
Total Protein: 7.7 g/dL (ref 6.5–8.1)

## 2021-12-08 LAB — TSH: TSH: 1.414 u[IU]/mL (ref 0.350–4.500)

## 2021-12-08 LAB — SARS-COV-2, NAA 2 DAY TAT

## 2021-12-08 LAB — MAGNESIUM: Magnesium: 1.9 mg/dL (ref 1.7–2.4)

## 2021-12-08 LAB — NOVEL CORONAVIRUS, NAA: SARS-CoV-2, NAA: DETECTED — AB

## 2021-12-08 MED ORDER — ALTEPLASE 2 MG IJ SOLR
2.0000 mg | Freq: Once | INTRAMUSCULAR | Status: AC
  Administered 2021-12-08: 2 mg
  Filled 2021-12-08: qty 2

## 2021-12-08 MED ORDER — IOHEXOL 300 MG/ML  SOLN
50.0000 mL | Freq: Once | INTRAMUSCULAR | Status: AC | PRN
  Administered 2021-12-08: 3 mL

## 2021-12-08 MED ORDER — HEPARIN SOD (PORK) LOCK FLUSH 100 UNIT/ML IV SOLN
500.0000 [IU] | Freq: Once | INTRAVENOUS | Status: AC | PRN
  Administered 2021-12-08: 500 [IU]

## 2021-12-08 MED ORDER — SODIUM CHLORIDE FLUSH 0.9 % IV SOLN
INTRAVENOUS | Status: AC
  Administered 2021-12-08: 10 mL via INTRAVENOUS
  Filled 2021-12-08: qty 10

## 2021-12-08 MED ORDER — SODIUM CHLORIDE 0.9% FLUSH
10.0000 mL | INTRAVENOUS | Status: DC | PRN
  Administered 2021-12-08: 10 mL

## 2021-12-08 MED ORDER — SODIUM CHLORIDE 0.9 % IV SOLN
200.0000 mg | Freq: Once | INTRAVENOUS | Status: AC
  Administered 2021-12-08: 200 mg via INTRAVENOUS
  Filled 2021-12-08: qty 8

## 2021-12-08 MED ORDER — SODIUM CHLORIDE 0.9 % IV SOLN: Freq: Once | INTRAVENOUS | Status: AC

## 2021-12-08 MED ORDER — MOLNUPIRAVIR EUA 200MG CAPSULE: 4.0000 | ORAL_CAPSULE | Freq: Two times a day (BID) | ORAL | 0 refills | Status: AC

## 2021-12-08 NOTE — Progress Notes (Signed)
Dye study images reviewed by Dr. Delton Coombes. Per Dr. Debe Coder / Lora Havens RN proceed with Beryle Flock .

## 2021-12-08 NOTE — Progress Notes (Signed)
Patient presents today for treatment. Unable to get blood return from patient's port, per protocol, alteplase administered at 1155, no blood return noted after 30 mins and 1 hour. Only 59mL of alteplase was able to be drawn off port prior to dye study. Patient sent to dye study.  Dye study images reviewed by Dr. Delton Coombes, okay to proceed with treatment per Dr. Delton Coombes. Patient tolerated Keytruda with no complaints voiced. Side effects with management reviewed understanding verbalized. Port site clean and dry with no bruising or swelling noted at site. Good blood return noted before and after administration of therapy. Band aid applied. Patient left in satisfactory condition with VSS and no s/s of distress noted.

## 2021-12-08 NOTE — Patient Instructions (Signed)
[facility]  Discharge Instructions: ?Thank you for choosing [facility] to provide your oncology and hematology care.  ?If you have a lab appointment with the Cancer Center, please come in thru the Main Entrance and check in at the main information desk. ? ?Wear comfortable clothing and clothing appropriate for easy access to any Portacath or PICC line.  ? ?We strive to give you quality time with your provider. You may need to reschedule your appointment if you arrive late (15 or more minutes).  Arriving late affects you and other patients whose appointments are after yours.  Also, if you miss three or more appointments without notifying the office, you may be dismissed from the clinic at the provider?s discretion.    ?  ?For prescription refill requests, have your pharmacy contact our office and allow 72 hours for refills to be completed.   ? ?Today you received the following chemotherapy and/or immunotherapy agents Keytruda, return as scheduled.  ?  ?To help prevent nausea and vomiting after your treatment, we encourage you to take your nausea medication as directed. ? ?BELOW ARE SYMPTOMS THAT SHOULD BE REPORTED IMMEDIATELY: ?*FEVER GREATER THAN 100.4 F (38 ?C) OR HIGHER ?*CHILLS OR SWEATING ?*NAUSEA AND VOMITING THAT IS NOT CONTROLLED WITH YOUR NAUSEA MEDICATION ?*UNUSUAL SHORTNESS OF BREATH ?*UNUSUAL BRUISING OR BLEEDING ?*URINARY PROBLEMS (pain or burning when urinating, or frequent urination) ?*BOWEL PROBLEMS (unusual diarrhea, constipation, pain near the anus) ?TENDERNESS IN MOUTH AND THROAT WITH OR WITHOUT PRESENCE OF ULCERS (sore throat, sores in mouth, or a toothache) ?UNUSUAL RASH, SWELLING OR PAIN  ?UNUSUAL VAGINAL DISCHARGE OR ITCHING  ? ?Items with * indicate a potential emergency and should be followed up as soon as possible or go to the Emergency Department if any problems should occur. ? ?Please show the CHEMOTHERAPY ALERT CARD or IMMUNOTHERAPY ALERT CARD at check-in to  the Emergency Department and triage nurse. ? ?Should you have questions after your visit or need to cancel or reschedule your appointment, please contact  [facility]ANC[facility] [phone]  and follow the prompts.  Office hours are 8:00 a.m. to 4:30 p.m. Monday - Friday. Please note that voicemails left after 4:00 p.m. may not be returned until the following business day.  We are closed weekends and major holidays. You have access to a nurse at all times for urgent questions. Please call the main number to the clinic [phone] and follow the prompts. ? ?For any non-urgent questions, you may also contact your provider using MyChart. We now offer e-Visits for anyone 18 and older to request care online for non-urgent symptoms. For details visit [email or web address]. ?  ?Also download the MyChart app! Go to the app store, search "MyChart", open the app, select [facility], and log in with your MyChart username and password. ? ?Due to Covid, a mask is required upon entering the hospital/clinic. If you do not have a mask, one will be given to you upon arrival. For doctor visits, patients may have 1 support person aged 18 or older with them. For treatment visits, patients cannot have anyone with them due to current Covid guidelines and our immunocompromised population.  ?

## 2021-12-08 NOTE — Telephone Encounter (Signed)
°  No answer unable to leave a message for patient to call back and schedule Medicare Annual Wellness Visit (AWV) to be completed by video or phone.  No hx of AWV eligible for AWVI as of 10/05/2012  per [facility]  Please schedule at anytime with [place] --- Karle Starch  45 Minutes appointment   Any questions, please call me at [phone]

## 2021-12-14 NOTE — Progress Notes (Signed)
[facility] Note  12/16/2021     CHIEF COMPLAINT Patient presents for Retina Follow Up    HISTORY OF PRESENT ILLNESS: Gabrielle [name] is a 76 y.o. female who presents to the clinic today for:   HPI     Retina Follow Up   Patient presents with  Wet AMD.  In both eyes.  Duration of 5 weeks.  Since onset it is stable.  I, the attending physician,  performed the HPI with the patient and updated documentation appropriately.        Comments   5 week follow up Exu ARMD OU-  Doing well, no new problems. Vision appears stable. Cosopt BID OU.      Last edited by Bernarda Caffey, MD on 12/16/2021  8:42 AM.      Pt states vision is about the same  Referring physician: Janora Norlander, [place],  [place] [address]  HISTORICAL INFORMATION:   Selected notes from the MEDICAL RECORD NUMBER Referred by Dr. Cristela Blue for concern of ARMD OU   CURRENT MEDICATIONS: Current Outpatient Medications (Ophthalmic Drugs)  Medication Sig   dorzolamide-timolol (COSOPT) 22.3-6.8 MG/ML ophthalmic solution Place 1 drop into both eyes 2 (two) times daily.   dorzolamide-timolol (COSOPT) 22.3-6.8 MG/ML ophthalmic solution PLACE 1 DROP INTO BOTH EYES 2 TIMES A DAY (Patient taking differently: Place 1 drop into both eyes 1 day or 1 dose.)   No current facility-administered medications for this visit. (Ophthalmic Drugs)   Current Outpatient Medications (Other)  Medication Sig   alendronate (FOSAMAX) 70 MG tablet Take 1 tablet (70 mg total) by mouth every 7 (seven) days. Take with a full glass of water on an empty stomach.   amLODipine (NORVASC) 5 MG tablet Take 1 tablet (5 mg total) by mouth daily.   atorvastatin (LIPITOR) 20 MG tablet Take 1 tablet (20 mg total) by mouth daily.   benzonatate (TESSALON PERLES) 100 MG capsule Take 1 capsule (100 mg total) by mouth 3 (three) times daily as needed.   cholecalciferol (VITAMIN D3) 25 MCG (1000 UT) tablet Take 1,000  Units by mouth daily.   CRANBERRY FRUIT PO Take 1 tablet by mouth daily.   diclofenac Sodium (VOLTAREN) 1 % GEL APPLY 4 GRAMS TO AFFECTED AREA(S) 4 TIMES A DAY   ELIQUIS 5 MG TABS tablet Take 1 tablet (5 mg total) by mouth 2 (two) times daily.   FEROSUL 325 (65 Fe) MG tablet TAKE  (1)  TABLET TWICE A DAY WITH MEALS (BREAKFAST AND SUPPER)   Garlic 220 MG TABS Take by mouth.   lidocaine-prilocaine (EMLA) cream Apply a small amount to port a cath site and cover with plastic wrap 1 hour prior to chemotherapy appointments   losartan-hydrochlorothiazide (HYZAAR) 100-25 MG tablet Take 1 tablet by mouth daily.   potassium chloride SA (KLOR-CON) 20 MEQ tablet Take 1 tablet (20 mEq total) by mouth 2 (two) times daily.   prochlorperazine (COMPAZINE) 10 MG tablet Take 1 tablet (10 mg total) by mouth every 6 (six) hours as needed for nausea or vomiting.   traMADol (ULTRAM) 50 MG tablet Take 1 tablet (50 mg total) by mouth 2 (two) times daily as needed.   No current facility-administered medications for this visit. (Other)   REVIEW OF SYSTEMS: ROS   Positive for: Gastrointestinal, Genitourinary, Musculoskeletal, Cardiovascular, Eyes Negative for: Constitutional, Neurological, Skin, HENT, Endocrine, Respiratory, Psychiatric, Allergic/Imm, Heme/Lymph Last edited by Leonie Douglas, COA on 12/16/2021  7:49  AM.      ALLERGIES Allergies  Allergen Reactions   Lisinopril Cough   PAST MEDICAL HISTORY Past Medical History:  Diagnosis Date   Arthritis    Cancer [facility])    Cataract    OU   Colon cancer ([place])    Family history of pancreatic cancer 08/21/2020   Family history of uterine cancer 08/21/2020   H/O cesarean section    Hx of tonsillectomy    Hyperlipidemia    Hypertension    Macular degeneration    Exu ARMD OU   Paroxysmal atrial fibrillation ([place]) 07/05/2020   Past Surgical History:  Procedure Laterality Date   BIOPSY  04/06/2020   Procedure: BIOPSY;  Surgeon: Rogene Houston, MD;  [facility]  [facility];  Service: Endoscopy;;   CARPAL TUNNEL RELEASE Right    CATARACT EXTRACTION W/PHACO Left 10/11/2019   Procedure: CATARACT EXTRACTION PHACO AND INTRAOCULAR LENS PLACEMENT ([place]);  Surgeon: Baruch Goldmann, MD;  Location: [facility] ORS;  Service: Ophthalmology;  Laterality: Left;  CDE: 8.56   CATARACT EXTRACTION W/PHACO Right 10/25/2019   Procedure: CATARACT EXTRACTION PHACO AND INTRAOCULAR LENS PLACEMENT (IOC);  Surgeon: Baruch Goldmann, MD;  Location: [facility];  Service: Ophthalmology;  Laterality: Right;  CDE: 5.67   CESAREAN SECTION     COLONOSCOPY N/A 04/06/2020   Procedure: COLONOSCOPY;  Surgeon: Rogene Houston, MD;  Location: [facility];  Service: Endoscopy;  Laterality: N/A;   CYSTOSCOPY WITH BIOPSY N/A 04/08/2020   Procedure: CYSTOSCOPY WITH BIOPSY;  Surgeon: Cleon Gustin, MD;  Location: [facility];  Service: Urology;  Laterality: N/A;   ESOPHAGOGASTRODUODENOSCOPY N/A 04/05/2020   Procedure: ESOPHAGOGASTRODUODENOSCOPY (EGD);  Surgeon: Rogene Houston, MD;  [facility];  Service: Endoscopy;  Laterality: N/A;   PARTIAL COLECTOMY N/A 04/08/2020   Procedure: PARTIAL COLECTOMY;  Surgeon: Virl Cagey, MD;  Location: [facility];  Service: General;  Laterality: N/A;   PORTACATH PLACEMENT Left 05/18/2020   Procedure: INSERTION PORT-A-CATH (ATTACHED CATHETER IN LEFT SUBCLAVIAN);  Surgeon: Virl Cagey, MD;  Location: [facility];  Service: General;  Laterality: Left;   TONSILLECTOMY      FAMILY HISTORY Family History  Problem Relation Age of Onset   Macular degeneration Mother    Stroke Mother    Hypertension Mother    Dementia Mother    Lung cancer Father        dx late 69s; smoking hx   Diabetes Sister    Hypertension Sister    Leukemia Paternal Uncle        d. 70s   Cancer Maternal Aunt        ovarian or endometrial dx 29s   Pancreatic cancer Paternal Uncle        d. late 32s    SOCIAL HISTORY Social History   Tobacco Use   Smoking status: Never    Smokeless tobacco: Never  Vaping Use   Vaping Use: Never used  Substance Use Topics   Alcohol use: No   Drug use: No       OPHTHALMIC EXAM: Base Eye Exam     Visual Acuity (Snellen - Linear)       Right Left   Dist cc CF 3' 20/80 -2   Dist ph cc NI NI         Tonometry (Tonopen, 7:55 AM)       Right Left   Pressure 19 18         Pupils  Dark Light Shape React APD   Right 3 2 Round Brisk None   Left 3 2 Round Brisk None         Visual Fields (Counting fingers)       Left Right    Full Full         Extraocular Movement       Right Left    Full Full         Neuro/Psych     Oriented x3: Yes   Mood/Affect: Normal         Dilation     Both eyes: 1.0% Mydriacyl, 2.5% Phenylephrine @ 7:55 AM           Slit Lamp and Fundus Exam     Slit Lamp Exam       Right Left   Lids/Lashes Dermatochalasis - upper lid Dermatochalasis - upper lid   Conjunctiva/Sclera 1+ Injection White and quiet   Cornea Arcus, 3+ fine Punctpate epithelial erosions Arcus, 3+ fine Punctate epithelial erosions, mild tear film debris   Anterior Chamber Deep and quiet Deep and quiet   Iris Round and well dilated Round and well dilated   Lens PCIOL in good position, 1+ Posterior capsular opacification PC IOL in good position with open PC   Anterior Vitreous Vitreous syneresis, Posterior vitreous detachment, vitreous condensations Vitreous syneresis, Posterior vitreous detachment, silicone oil micro drops         Fundus Exam       Right Left   Disc pink and sharp, compact mild pallor, sharp, Compact, vascular loops superiorly   C/D Ratio 0.2 0.2   Macula Blunted foveal reflex, Drusen, Pigment clumping and atrophy, central thickening/pigmented disciform scar, +PED, no heme, persistent, trace cystic changes overlying central scar Blunted foveal reflex, central thickening, RPE clumping and atrophy, Drusen, RPE rip, trace cystic changes overlying PED, faint IRH nasal  fovea - improved, no heme   Vessels Vascular attenuation, Tortuous Vascular attenuation, Tortuous   Periphery Attached, mild Reticular degeneration Attached, mild Reticular degeneration           Refraction     Wearing Rx       Sphere Cylinder Add   Right [place] Sphere +3.50   Left -0.50 Sphere +3.50    Type: PAL            IMAGING AND PROCEDURES  Imaging and Procedures for 03/21/18  OCT, Retina - OU - Both Eyes       Right Eye Quality was good. Central Foveal Thickness: 552. Progression has been stable. Findings include pigment epithelial detachment, epiretinal membrane, subretinal hyper-reflective material, retinal drusen , abnormal foveal contour, no SRF, intraretinal fluid, disciform scar, outer retinal tubulation, outer retinal atrophy (Stable sub-retinal scar, mild persistent cystic changes overlying).   Left Eye Quality was good. Central Foveal Thickness: 344. Progression has been stable. Findings include subretinal hyper-reflective material, pigment epithelial detachment, intraretinal fluid, retinal drusen , outer retinal atrophy, normal foveal contour, no SRF (persistent cystic changes overlying PED/SRHM -- ?mild improvement in PED/SRHM).   Notes *Images captured and stored on drive  Diagnosis / Impression:  Exudative ARMD OU OD: Stable sub-retinal scar, mild persistent cystic changes overlying OS: persistent cystic changes overlying PED/SRHM -- ?mild improvement in PED/SRHM  Clinical management:  See below  Abbreviations: NFP - Normal foveal profile. CME - cystoid macular edema. PED - pigment epithelial detachment. IRF - intraretinal fluid. SRF - subretinal fluid. EZ - ellipsoid zone. ERM - epiretinal membrane. ORA - outer retinal  atrophy. ORT - outer retinal tubulation. SRHM - subretinal hyper-reflective material      Intravitreal Injection, Pharmacologic Agent - OS - Left Eye       Time Out 12/16/2021. 8:21 AM. Confirmed correct patient, procedure,  site, and patient consented.   Anesthesia Topical anesthesia was used. Anesthetic medications included Lidocaine 2%, Proparacaine 0.5%.   Procedure Preparation included 5% betadine to ocular surface, eyelid speculum. A (32g) needle was used.   Injection: 2 mg aflibercept 2 MG/0.05ML   Route: Intravitreal, Site: Left Eye   NDC: A3590391, Lot: [id number], Expiration date: 11/03/2022, Waste: 0.05 mL   Post-op Post injection exam found visual acuity of at least counting fingers. The patient tolerated the procedure well. There were no complications. The patient received written and verbal post procedure care education. Post injection medications were not given.             ASSESSMENT/PLAN:    ICD-10-CM   1. Exudative age-related macular degeneration of both eyes with active choroidal neovascularization (HCC)  H35.3231 OCT, Retina - OU - Both Eyes    Intravitreal Injection, Pharmacologic Agent - OS - Left Eye    aflibercept (EYLEA) SOLN 2 mg    2. Posterior vitreous detachment of both eyes  H43.813     3. Pseudophakia of both eyes  Z96.1     4. Ocular hypertension, bilateral  H40.053     5. Dry eyes  H04.123      1. Exudative age related macular degeneration, both eyes.    - severe exudative disease with very active CNVM OU at presentation in January 2019  - S/P IVA OD #1 (01.04.19), #2 (02.15.19)  - S/P IVA OS #1 (01.18.19), #2 (02.15.19)  - switched to Eylea 3.18.19 due to severity of disease  - S/P IVE OD #1 (03.18.19), #2 (04.17.19), #3 (05.15.19), #4 (10.02.19),  #5 (01.15.20), #6 (06.04.20), #7 (10.29.20), #8 (01.21.21), #9 (05.27.21), #10 (07.22.21) -- injections held due to stable disciform scar  - S/P IVE OS #1 (03.18.19), #2 (04.17.19), #3 (05.15.19), #4 (06.12.19), #5 (07.10.19), #6 (08.07.19), #7 (09.04.19), #8 (10.02.19), #9 (11.06.19), #10 (12.11.19), #11 (01.15.20), #12 (02.20.20), #13 (03.26.20), #14 (04.30.20), #15 (06.04.20), #16 (07.16.20), # 17  (08.20.20), #18 (09.24.20), #19 (10.29.20), #20 (12.03.20), #21 (01.21.21), #22 (03.18.21), #23 (05.27.21), #24 (07.22.21), #25 (11.11.21), #26 (12.09.21), #27 (01.06.22), #28 (2.7.22), #29 (03.24.22), #30 (4.28.22), #31 (05.31.22), #32 (06.30.22), #33 (08.04.22), #34 (09.15.22), #35 (10.27.22), #36 (12.08.22)  - IVE OS held from 7.22.21 to 11.11.21 due to TIA/stroke on 8.1.21  - OCT OS: persistent cystic changes overlying PED/SRHM at 6 wks  - exam OS shows faint IRH nasal fovea resolved today  - OD: Stable sub-retinal scar, mild persistent cystic changes overlying  - BCVA OD stable at CF 3'; OS 20/80 stable  - recommend IVE OS #37 today, 01.12.23 w/ f/u 5 wks - will cont to hold OD  - Eylea informed consent form signed and scanned on 01.21.2021  - Eylea4U paperwork filled out on 02.15.19 and fully approved through [employer] -- approved  - f/u in 5 weeks, sooner prn for DFE/OCT/possible injection(s)  2. PVD / vitreous syneresis  - Discussed findings and prognosis   - No RT or RD on 360 exam  - Reviewed s/s of RT/RD  - Strict return precautions for any such RT/RD signs/symptoms  3. Pseudophakia OU  - s/p CE/IOL (Dr. Marisa Hua, 11.2020)  - beautiful surgeries w/ IOLs in excellent position, doing well  - monitor   4. Ocular  Hypertension OU  - IOP 19,18  - cont Cosopt BID OU  5. Dry eyes OU - recommend artificial tears and lubricating ointment as needed  Ophthalmic Meds Ordered this visit:  Meds ordered this encounter  Medications   aflibercept (EYLEA) SOLN 2 mg      Return in about 5 weeks (around 01/20/2022) for f/u exu ARMD OU, DFE, OCT.  There are no Patient Instructions on file for this visit.  This document serves as a record of services personally performed by Gardiner Sleeper, MD, PhD. It was created on their behalf by San Jetty. Owens Shark, OA an ophthalmic technician. The creation of this record is the provider's dictation and/or activities during the visit.    Electronically  signed by: San Jetty. Owens Shark, [place] 01.10.2023 9:30 AM  Gardiner Sleeper, M.D., Ph.D. Diseases & Surgery of the Retina and Vitreous [facility]  I have reviewed the above documentation for accuracy and completeness, and I agree with the above. Gardiner Sleeper, M.D., Ph.D. 12/16/21 9:30 AM   Abbreviations: M myopia (nearsighted); A astigmatism; H hyperopia (farsighted); P presbyopia; Mrx spectacle prescription;  CTL contact lenses; OD right eye; OS left eye; OU both eyes  XT exotropia; ET esotropia; PEK punctate epithelial keratitis; PEE punctate epithelial erosions; DES dry eye syndrome; MGD meibomian gland dysfunction; ATs artificial tears; PFAT's preservative free artificial tears; [place] nuclear sclerotic cataract; PSC posterior subcapsular cataract; ERM epi-retinal membrane; PVD posterior vitreous detachment; RD retinal detachment; DM diabetes mellitus; DR diabetic retinopathy; NPDR non-proliferative diabetic retinopathy; PDR proliferative diabetic retinopathy; CSME clinically significant macular edema; DME diabetic macular edema; dbh dot blot hemorrhages; CWS cotton wool spot; POAG primary open angle glaucoma; C/D cup-to-disc ratio; HVF humphrey visual field; GVF goldmann visual field; OCT optical coherence tomography; IOP intraocular pressure; BRVO Branch retinal vein occlusion; CRVO central retinal vein occlusion; CRAO central retinal artery occlusion; BRAO branch retinal artery occlusion; RT retinal tear; SB scleral buckle; PPV pars plana vitrectomy; VH Vitreous hemorrhage; PRP panretinal laser photocoagulation; IVK intravitreal kenalog; VMT vitreomacular traction; MH Macular hole;  NVD neovascularization of the disc; NVE neovascularization elsewhere; AREDS age related eye disease study; ARMD age related macular degeneration; POAG primary open angle glaucoma; EBMD epithelial/anterior basement membrane dystrophy; ACIOL anterior chamber intraocular lens; IOL intraocular lens; PCIOL  posterior chamber intraocular lens; Phaco/IOL phacoemulsification with intraocular lens placement; [place] photorefractive keratectomy; LASIK laser assisted in situ keratomileusis; HTN hypertension; DM diabetes mellitus; COPD chronic obstructive pulmonary disease

## 2021-12-16 ENCOUNTER — Other Ambulatory Visit: Payer: Self-pay

## 2021-12-16 ENCOUNTER — Ambulatory Visit (INDEPENDENT_AMBULATORY_CARE_PROVIDER_SITE_OTHER): Payer: Medicare HMO | Admitting: Ophthalmology

## 2021-12-16 ENCOUNTER — Encounter (INDEPENDENT_AMBULATORY_CARE_PROVIDER_SITE_OTHER): Payer: Self-pay | Admitting: Ophthalmology

## 2021-12-16 DIAGNOSIS — H43813 Vitreous degeneration, bilateral: Secondary | ICD-10-CM

## 2021-12-16 DIAGNOSIS — H353231 Exudative age-related macular degeneration, bilateral, with active choroidal neovascularization: Secondary | ICD-10-CM | POA: Diagnosis not present

## 2021-12-16 DIAGNOSIS — Z961 Presence of intraocular lens: Secondary | ICD-10-CM | POA: Diagnosis not present

## 2021-12-16 DIAGNOSIS — H25813 Combined forms of age-related cataract, bilateral: Secondary | ICD-10-CM

## 2021-12-16 DIAGNOSIS — H40053 Ocular hypertension, bilateral: Secondary | ICD-10-CM | POA: Diagnosis not present

## 2021-12-16 DIAGNOSIS — H04123 Dry eye syndrome of bilateral lacrimal glands: Secondary | ICD-10-CM

## 2021-12-16 MED ORDER — AFLIBERCEPT 2MG/0.05ML IZ SOLN FOR KALEIDOSCOPE
2.0000 mg | INTRAVITREAL | Status: AC | PRN
  Administered 2021-12-16: 2 mg via INTRAVITREAL

## 2021-12-29 ENCOUNTER — Inpatient Hospital Stay (HOSPITAL_COMMUNITY): Payer: Medicare HMO

## 2021-12-29 ENCOUNTER — Other Ambulatory Visit: Payer: Self-pay

## 2021-12-29 ENCOUNTER — Inpatient Hospital Stay (HOSPITAL_COMMUNITY): Payer: Medicare HMO | Admitting: Hematology

## 2021-12-29 VITALS — BP 159/83 | HR 68 | Temp 97.7°F | Resp 18

## 2021-12-29 DIAGNOSIS — Z79899 Other long term (current) drug therapy: Secondary | ICD-10-CM | POA: Diagnosis not present

## 2021-12-29 DIAGNOSIS — C189 Malignant neoplasm of colon, unspecified: Secondary | ICD-10-CM

## 2021-12-29 DIAGNOSIS — C182 Malignant neoplasm of ascending colon: Secondary | ICD-10-CM | POA: Diagnosis not present

## 2021-12-29 DIAGNOSIS — Z452 Encounter for adjustment and management of vascular access device: Secondary | ICD-10-CM | POA: Diagnosis not present

## 2021-12-29 DIAGNOSIS — Z95828 Presence of other vascular implants and grafts: Secondary | ICD-10-CM | POA: Diagnosis not present

## 2021-12-29 DIAGNOSIS — Z5112 Encounter for antineoplastic immunotherapy: Secondary | ICD-10-CM | POA: Diagnosis not present

## 2021-12-29 LAB — CBC WITH DIFFERENTIAL/PLATELET
Abs Immature Granulocytes: 0.04 10*3/uL (ref 0.00–0.07)
Basophils Absolute: 0 10*3/uL (ref 0.0–0.1)
Basophils Relative: 1 %
Eosinophils Absolute: 0.2 10*3/uL (ref 0.0–0.5)
Eosinophils Relative: 3 %
HCT: 40.6 % (ref 36.0–46.0)
Hemoglobin: 13.5 g/dL (ref 12.0–15.0)
Immature Granulocytes: 1 %
Lymphocytes Relative: 24 %
Lymphs Abs: 1.6 10*3/uL (ref 0.7–4.0)
MCH: 31.8 pg (ref 26.0–34.0)
MCHC: 33.3 g/dL (ref 30.0–36.0)
MCV: 95.8 fL (ref 80.0–100.0)
Monocytes Absolute: 0.6 10*3/uL (ref 0.1–1.0)
Monocytes Relative: 9 %
Neutro Abs: 4.2 10*3/uL (ref 1.7–7.7)
Neutrophils Relative %: 62 %
Platelets: 153 10*3/uL (ref 150–400)
RBC: 4.24 MIL/uL (ref 3.87–5.11)
RDW: 13.2 % (ref 11.5–15.5)
WBC: 6.6 10*3/uL (ref 4.0–10.5)
nRBC: 0 % (ref 0.0–0.2)

## 2021-12-29 LAB — COMPREHENSIVE METABOLIC PANEL
ALT: 17 U/L (ref 0–44)
AST: 23 U/L (ref 15–41)
Albumin: 4.5 g/dL (ref 3.5–5.0)
Alkaline Phosphatase: 68 U/L (ref 38–126)
Anion gap: 8 (ref 5–15)
BUN: 20 mg/dL (ref 8–23)
CO2: 32 mmol/L (ref 22–32)
Calcium: 9.8 mg/dL (ref 8.9–10.3)
Chloride: 96 mmol/L — ABNORMAL LOW (ref 98–111)
Creatinine, Ser: 1.08 mg/dL — ABNORMAL HIGH (ref 0.44–1.00)
GFR, Estimated: 54 mL/min — ABNORMAL LOW (ref >60)
Glucose, Bld: 98 mg/dL (ref 70–99)
Potassium: 3.4 mmol/L — ABNORMAL LOW (ref 3.5–5.1)
Sodium: 136 mmol/L (ref 135–145)
Total Bilirubin: 0.2 mg/dL — ABNORMAL LOW (ref 0.3–1.2)
Total Protein: 8.1 g/dL (ref 6.5–8.1)

## 2021-12-29 LAB — MAGNESIUM: Magnesium: 2 mg/dL (ref 1.7–2.4)

## 2021-12-29 LAB — TSH: TSH: 1.628 u[IU]/mL (ref 0.350–4.500)

## 2021-12-29 MED ORDER — SODIUM CHLORIDE 0.9% FLUSH
10.0000 mL | INTRAVENOUS | Status: DC | PRN
  Administered 2021-12-29: 15:00:00 10 mL

## 2021-12-29 MED ORDER — HEPARIN SOD (PORK) LOCK FLUSH 100 UNIT/ML IV SOLN
500.0000 [IU] | Freq: Once | INTRAVENOUS | Status: AC | PRN
  Administered 2021-12-29: 15:00:00 500 [IU]

## 2021-12-29 MED ORDER — SODIUM CHLORIDE 0.9 % IV SOLN: Freq: Once | INTRAVENOUS | Status: AC

## 2021-12-29 MED ORDER — SODIUM CHLORIDE 0.9 % IV SOLN
200.0000 mg | Freq: Once | INTRAVENOUS | Status: AC
  Administered 2021-12-29: 15:00:00 200 mg via INTRAVENOUS
  Filled 2021-12-29: qty 8

## 2021-12-29 NOTE — Progress Notes (Signed)
° °[facility] °[address] °[place], [place] [address] ° ° °CLINIC:  °Medical Oncology/Hematology ° °PCP:  °Gottschalk, Ashly M, DO °[address] / [place] [address] °[phone] ° ° °REASON FOR VISIT:  °Follow-up for right colon cancer ° °PRIOR THERAPY:  °1. Right hemicolectomy on 04/08/2020. °2. FOLFOX x 3 cycles from 05/19/2020 to 07/14/2020. ° °NGS Results: Foundation 1 KRAS/NRAS wild-type, MSI--high, TMB 55 Muts/Mb, BRAF V 600 E ° °CURRENT THERAPY: Keytruda every 3 weeks ° °BRIEF ONCOLOGIC HISTORY:  °Oncology History  °Malignant neoplasm of colon (HCC)  °04/07/2020 Initial Diagnosis  ° Malignant neoplasm of ascending colon (HCC) °  °05/13/2020 Genetic Testing  ° Foundation One ° ° °  °05/19/2020 - 07/02/2020 Chemotherapy  ° The patient had palonosetron (ALOXI) injection 0.25 mg, 0.25 mg, Intravenous,  Once, 2 of 12 cycles °Administration: 0.25 mg (05/19/2020), 0.25 mg (06/30/2020) °leucovorin 522 mg in dextrose 5 % 250 mL infusion, 320 mg/m2 = 522 mg (80 % of original dose 400 mg/m2), Intravenous,  Once, 2 of 12 cycles °Dose modification: 320 mg/m2 (80 % of original dose 400 mg/m2, Cycle 1, Reason: Provider Judgment), 266.6667 mg/m2 (66.7 % of original dose 400 mg/m2, Cycle 2, Reason: Provider Judgment) °Administration: 522 mg (05/19/2020), 434 mg (06/30/2020) °oxaliplatin (ELOXATIN) 110 mg in dextrose 5 % 500 mL chemo infusion, 68 mg/m2 = 110 mg (80 % of original dose 85 mg/m2), Intravenous,  Once, 1 of 1 cycle °Dose modification: 68 mg/m2 (80 % of original dose 85 mg/m2, Cycle 1, Reason: Provider Judgment) °Administration: 110 mg (05/19/2020) °fluorouracil (ADRUCIL) chemo injection 500 mg, 320 mg/m2 = 500 mg (80 % of original dose 400 mg/m2), Intravenous,  Once, 2 of 12 cycles °Dose modification: 320 mg/m2 (80 % of original dose 400 mg/m2, Cycle 1, Reason: Provider Judgment), 266.6667 mg/m2 (66.7 % of original dose 400 mg/m2, Cycle 2, Reason: Provider Judgment) °Administration: 500 mg (05/19/2020), 450  mg (06/30/2020) °fluorouracil (ADRUCIL) 3,150 mg in sodium chloride 0.9 % 87 mL chemo infusion, 1,920 mg/m2 = 3,150 mg (80 % of original dose 2,400 mg/m2), Intravenous, 1 Day/Dose, 2 of 12 cycles °Dose modification: 1,920 mg/m2 (80 % of original dose 2,400 mg/m2, Cycle 1, Reason: Provider Judgment), 1,600 mg/m2 (66.7 % of original dose 2,400 mg/m2, Cycle 2, Reason: Provider Judgment) °Administration: 3,150 mg (05/19/2020), 2,600 mg (06/30/2020) ° ° for chemotherapy treatment.  ° °  °08/03/2020 Genetic Testing  ° [facility] Reveal Testing ° ° °  °08/14/2020 Genetic Testing  ° No pathogenic variants detected in [facility] Common Hereditary Cancers Panel.  Variant of uncertain significance (VUS) detected in HOXB13 at c.634G>A (p.Ala212Thr). The Common Hereditary Cancers Panel offered by [facility] includes sequencing and/or deletion duplication testing of the following 48 genes: APC, ATM, AXIN2, BARD1, BMPR1A, BRCA1, BRCA2, BRIP1, CDH1, CDK4, CDKN2A (p14ARF), CDKN2A (p16INK4a), CHEK2, CTNNA1, DICER1, EPCAM (Deletion/duplication testing only), FLCN, GREM1 (promoter region deletion/duplication testing only), KIT, MEN1, MLH1, MSH2, MSH3, MSH6, MUTYH, NBN, NF1, NHTL1, PALB2, PDGFRA, PMS2, POLD1, POLE, PTEN, RAD50, RAD51C, RAD51D, RNF43, SDHB, SDHC, SDHD, SMAD4, SMARCA4. STK11, TP53, TSC1, TSC2, and VHL.  The following genes were evaluated for sequence changes only: SDHA and HOXB13 c.251G>A variant only. The report date is August 14, 2020.  °  °11/05/2020 Cancer Staging  ° Staging form: Colon and Rectum, AJCC 8th Edition °- Pathologic stage from 11/05/2020: Stage IVC (rpTX, pN0, pM1c) - Signed by Jaydien Panepinto, MD on 11/05/2020 ° °  °11/10/2020 -  Chemotherapy  ° Patient is on Treatment Plan :   COLORECTAL Pembrolizumab q21d       CANCER STAGING:  Cancer Staging  Malignant neoplasm of colon [facility]) Staging form: Colon and Rectum, AJCC 8th Edition - Clinical stage from 04/27/2020: Stage IIIB (cT4a, cN1a, cM0) - Unsigned -  Pathologic stage from 11/05/2020: Stage IVC (rpTX, pN0, pM1c) - Signed by Derek Jack, MD on 11/05/2020   INTERVAL HISTORY:  Gabrielle [name], a 76 y.o. female, returns for routine follow-up and consideration for next cycle of chemotherapy. Gabrielle [name] was last seen on 11/16/2021.  Due for cycle #17 of Keytruda today.   Overall, she tells me she has been feeling pretty well. She reports a Covid infection 2 weeks ago at which time she experience rhinorrhea and cough which have since resolved. She uses Voltaren for her right scapula pain, and she is no longer requiring tramadol. She is taking potassium BID. She denies diarrhea. She reports her appetite and energy are good.   Overall, she feels ready for next cycle of chemo today.   REVIEW OF SYSTEMS:  Review of Systems  Constitutional:  Negative for appetite change and fatigue.  Respiratory:  Negative for cough (resolved).   Neurological:  Positive for headaches.  All other systems reviewed and are negative.  PAST MEDICAL/SURGICAL HISTORY:  Past Medical History:  Diagnosis Date   Arthritis    Cancer [facility])    Cataract    OU   Colon cancer ([place])    Family history of pancreatic cancer 08/21/2020   Family history of uterine cancer 08/21/2020   H/O cesarean section    Hx of tonsillectomy    Hyperlipidemia    Hypertension    Macular degeneration    Exu ARMD OU   Paroxysmal atrial fibrillation ([place]) 07/05/2020   Past Surgical History:  Procedure Laterality Date   BIOPSY  04/06/2020   Procedure: BIOPSY;  Surgeon: Rogene Houston, MD;  Location: [facility] END[facility];  Service: Endoscopy;;   CARPAL TUNNEL RELEASE Right    CATARACT EXTRACTION W/PHACO Left 10/11/2019   Procedure: CATARACT EXTRACTION PHACO AND INTRAOCULAR LENS PLACEMENT ([place]);  Surgeon: Baruch Goldmann, MD;  Location: [facility] ORS;  Service: Ophthalmology;  Laterality: Left;  CDE: 8.56   CATARACT EXTRACTION W/PHACO Right 10/25/2019   Procedure: CATARACT EXTRACTION PHACO AND  INTRAOCULAR LENS PLACEMENT ([facility]);  Surgeon: Baruch Goldmann, MD;  Location: [facility] ORS;  Service: Ophthalmology;  Laterality: Right;  CDE: 5.67   CESAREAN SECTION     COLONOSCOPY N/A 04/06/2020   Procedure: COLONOSCOPY;  Surgeon: Rogene Houston, MD;  Location: [facility];  Service: Endoscopy;  Laterality: N/A;   CYSTOSCOPY WITH BIOPSY N/A 04/08/2020   Procedure: CYSTOSCOPY WITH BIOPSY;  Surgeon: Cleon Gustin, MD;  Location: [facility] ORS;  Service: Urology;  Laterality: N/A;   ESOPHAGOGASTRODUODENOSCOPY N/A 04/05/2020   Procedure: ESOPHAGOGASTRODUODENOSCOPY (EGD);  Surgeon: Rogene Houston, MD;  Location: [facility];  Service: Endoscopy;  Laterality: N/A;   PARTIAL COLECTOMY N/A 04/08/2020   Procedure: PARTIAL COLECTOMY;  Surgeon: Virl Cagey, MD;  Location: [facility] ORS;  Service: General;  Laterality: N/A;   PORTACATH PLACEMENT Left 05/18/2020   Procedure: INSERTION PORT-A-CATH (ATTACHED CATHETER IN LEFT SUBCLAVIAN);  Surgeon: Virl Cagey, MD;  Location: [facility] ORS;  Service: General;  Laterality: Left;   TONSILLECTOMY      SOCIAL HISTORY:  Social History   Socioeconomic History   Marital status: Divorced    Spouse name: Not on file   Number of children: Not on file   Years of education:  Not on file   Highest education level: Not on file  Occupational History   Not on file  Tobacco Use   Smoking status: Never   Smokeless tobacco: Never  Vaping Use   Vaping Use: Never used  Substance and Sexual Activity   Alcohol use: No   Drug use: No   Sexual activity: Not Currently  Other Topics Concern   Not on file  Social History Narrative   Not on file   Social Determinants of Health   Financial Resource Strain: Not on file  Food Insecurity: Not on file  Transportation Needs: Not on file  Physical Activity: Not on file  Stress: Not on file  Social Connections: Not on file  Intimate Partner Violence: Not on file    FAMILY HISTORY:  Family History  Problem Relation Age of  Onset   Macular degeneration Mother    Stroke Mother    Hypertension Mother    Dementia Mother    Lung cancer Father        dx late 50s; smoking hx   Diabetes Sister    Hypertension Sister    Leukemia Paternal Uncle        d. 32s   Cancer Maternal Aunt        ovarian or endometrial dx 68s   Pancreatic cancer Paternal Uncle        d. late 26s    CURRENT MEDICATIONS:  Current Outpatient Medications  Medication Sig Dispense Refill   alendronate (FOSAMAX) 70 MG tablet Take 1 tablet (70 mg total) by mouth every 7 (seven) days. Take with a full glass of water on an empty stomach. 12 tablet 3   amLODipine (NORVASC) 5 MG tablet Take 1 tablet (5 mg total) by mouth daily. 90 tablet 3   atorvastatin (LIPITOR) 20 MG tablet Take 1 tablet (20 mg total) by mouth daily. 90 tablet 3   benzonatate (TESSALON PERLES) 100 MG capsule Take 1 capsule (100 mg total) by mouth 3 (three) times daily as needed. 20 capsule 0   cholecalciferol (VITAMIN D3) 25 MCG (1000 UT) tablet Take 1,000 Units by mouth daily.     CRANBERRY FRUIT PO Take 1 tablet by mouth daily.     diclofenac Sodium (VOLTAREN) 1 % GEL APPLY 4 GRAMS TO AFFECTED AREA(S) 4 TIMES A DAY 400 g 2   dorzolamide-timolol (COSOPT) 22.3-6.8 MG/ML ophthalmic solution PLACE 1 DROP INTO BOTH EYES 2 TIMES A DAY (Patient taking differently: Place 1 drop into both eyes 1 day or 1 dose.) 10 mL 0   dorzolamide-timolol (COSOPT) 22.3-6.8 MG/ML ophthalmic solution Place 1 drop into both eyes 2 (two) times daily. 10 mL 3   ELIQUIS 5 MG TABS tablet Take 1 tablet (5 mg total) by mouth 2 (two) times daily. 60 tablet 6   FEROSUL 325 (65 Fe) MG tablet TAKE  (1)  TABLET TWICE A DAY WITH MEALS (BREAKFAST AND SUPPER) 628 tablet 0   Garlic 315 MG TABS Take by mouth.     lidocaine-prilocaine (EMLA) cream Apply a small amount to port a cath site and cover with plastic wrap 1 hour prior to chemotherapy appointments 30 g 0   losartan-hydrochlorothiazide (HYZAAR) 100-25 MG tablet  Take 1 tablet by mouth daily. 90 tablet 3   potassium chloride SA (KLOR-CON) 20 MEQ tablet Take 1 tablet (20 mEq total) by mouth 2 (two) times daily. 180 tablet 1   prochlorperazine (COMPAZINE) 10 MG tablet Take 1 tablet (10 mg total) by mouth every  6 (six) hours as needed for nausea or vomiting. 30 tablet 0   traMADol (ULTRAM) 50 MG tablet Take 1 tablet (50 mg total) by mouth 2 (two) times daily as needed. 30 tablet 0   No current facility-administered medications for this visit.    ALLERGIES:  Allergies  Allergen Reactions   Lisinopril Cough    PHYSICAL EXAM:  Performance status (ECOG): 1 - Symptomatic but completely ambulatory  There were no vitals filed for this visit. Wt Readings from Last 3 Encounters:  12/08/21 170 lb 3.2 oz (77.2 kg)  12/07/21 170 lb 9.6 oz (77.4 kg)  11/16/21 173 lb 4.8 oz (78.6 kg)   Physical Exam Vitals reviewed.  Constitutional:      Appearance: Normal appearance.  Cardiovascular:     Rate and Rhythm: Normal rate and regular rhythm.     Pulses: Normal pulses.     Heart sounds: Normal heart sounds.  Pulmonary:     Effort: Pulmonary effort is normal.     Breath sounds: Normal breath sounds.  Neurological:     General: No focal deficit present.     Mental Status: She is alert and oriented to person, place, and time.  Psychiatric:        Mood and Affect: Mood normal.        Behavior: Behavior normal.    LABORATORY DATA:  I have reviewed the labs as listed.  CBC Latest Ref Rng & Units 12/29/2021 12/08/2021 11/16/2021  WBC 4.0 - 10.5 K/uL 6.6 5.3 5.7  Hemoglobin 12.0 - 15.0 g/dL 13.5 12.9 12.9  Hematocrit 36.0 - 46.0 % 40.6 38.3 38.7  Platelets 150 - 400 K/uL 153 147(L) 124(L)   CMP Latest Ref Rng & Units 12/29/2021 12/08/2021 11/16/2021  Glucose 70 - 99 mg/dL 98 104(H) 158(H)  BUN 8 - 23 mg/dL $Remove'20 20 21  'wuzQOEY$ Creatinine 0.44 - 1.00 mg/dL 1.08(H) 1.08(H) 1.08(H)  Sodium 135 - 145 mmol/L 136 138 137  Potassium 3.5 - 5.1 mmol/L 3.4(L) 4.1 3.3(L)   Chloride 98 - 111 mmol/L 96(L) 99 100  CO2 22 - 32 mmol/L 32 31 29  Calcium 8.9 - 10.3 mg/dL 9.8 9.7 9.7  Total Protein 6.5 - 8.1 g/dL 8.1 7.7 7.5  Total Bilirubin 0.3 - 1.2 mg/dL 0.2(L) 0.3 0.4  Alkaline Phos 38 - 126 U/L 68 74 62  AST 15 - 41 U/L $Remo'23 23 28  'LjHQN$ ALT 0 - 44 U/L $Remo'17 22 23    'yoQbk$ DIAGNOSTIC IMAGING:  I have independently reviewed the scans and discussed with the patient. Intravitreal Injection, Pharmacologic Agent - OS - Left Eye  Result Date: 12/16/2021 Time Out 12/16/2021. 8:21 AM. Confirmed correct patient, procedure, site, and patient consented. Anesthesia Topical anesthesia was used. Anesthetic medications included Lidocaine 2%, Proparacaine 0.5%. Procedure Preparation included 5% betadine to ocular surface, eyelid speculum. A (32g) needle was used. Injection: 2 mg aflibercept 2 MG/0.05ML   Route: Intravitreal, Site: Left Eye   NDC: [id number], Lot: [id number], Expiration date: 11/03/2022, Waste: 0.05 mL Post-op Post injection exam found visual acuity of at least counting fingers. The patient tolerated the procedure well. There were no complications. The patient received written and verbal post procedure care education. Post injection medications were not given.   OCT, Retina - OU - Both Eyes  Result Date: 12/16/2021 Right Eye Quality was good. Central Foveal Thickness: 552. Progression has been stable. Findings include pigment epithelial detachment, epiretinal membrane, subretinal hyper-reflective material, retinal drusen , abnormal foveal contour, no SRF, intraretinal fluid, disciform  scar, outer retinal tubulation, outer retinal atrophy (Stable sub-retinal scar, mild persistent cystic changes overlying). Left Eye Quality was good. Central Foveal Thickness: 344. Progression has been stable. Findings include subretinal hyper-reflective material, pigment epithelial detachment, intraretinal fluid, retinal drusen , outer retinal atrophy, normal foveal contour, no SRF (persistent cystic  changes overlying PED/SRHM -- ?mild improvement in PED/SRHM). Notes *Images captured and stored on drive Diagnosis / Impression: Exudative ARMD OU OD: Stable sub-retinal scar, mild persistent cystic changes overlying OS: persistent cystic changes overlying PED/SRHM -- ?mild improvement in PED/SRHM Clinical management: See below Abbreviations: NFP - Normal foveal profile. CME - cystoid macular edema. PED - pigment epithelial detachment. IRF - intraretinal fluid. SRF - subretinal fluid. EZ - ellipsoid zone. ERM - epiretinal membrane. ORA - outer retinal atrophy. ORT - outer retinal tubulation. SRHM - subretinal hyper-reflective material   DG CV Line Injection  Result Date: 12/08/2021 INDICATION: Nonfunctioning Port-A-Cath, per patient has had Port-A-Cath for about a year, unable to obtain blood return EXAM: CONTRAST INJECTION OF PORT A CATH UNDER FLUOROSCOPY CONTRAST:  74mL OMNIPAQUE IOHEXOL 300 MG/ML  SOLN TECHNIQUE: Contrast was administered via the indwelling port after it was accessed. Fluoroscopic spot images were obtained of the catheter during injection ANESTHESIA/SEDATION: None FLUOROSCOPY TIME:  Radiation Exposure Index (as provided by the fluoroscopic device): 2.5 mGy If the device does not provide the exposure index: Fluoroscopy Time:  0 minutes and 30 seconds Number of Acquired Images:  Insert spots COMPLICATIONS: None immediate. TECHNIQUE: Indwelling LEFT subclavian Port-A-Cath was injected with contrast material under fluoroscopy. FINDINGS: Reservoir adequately accessed by needle. Tubing and Port-A-Cath are well connected without contrast extravasation. Catheter tubing opacifies normally. High-grade resistance to contrast injection, with only a small amount of contrast injecting into the SVC. A small amount of contrast refluxes into the LEFT brachiocephalic vein. Findings are consistent with high-grade obstruction of the outflow of the Port-A-Cath tubing either by thrombus or fibrin. Unable to obtain  blood return. IMPRESSION: High-grade resistance to outflow from the Port-A-Cath tubing by either thrombus or fibrin. I was able however to inject some contrast into the SVC, reflux of contrast noted into the LEFT brachiocephalic vein. Electronically Signed   By: Lavonia Dana M.D.   On: 12/08/2021 14:16     ASSESSMENT:  1.  Stage IIIb (T4AN1A) poorly differentiated right colon adenocarcinoma: -Right hemicolectomy on 04/07/2020 with poorly differentiated adenocarcinoma, pT4a, positive radial margin, 1/15 lymph nodes positive, loss of MLH1 and PMS2, bladder biopsy negative for malignancy. -CTAP on 04/06/2020 showed circumferential ascending colon mass with numerous borderline enlarged pericolonic lymph nodes.  No findings of hepatic metastatic disease. -CEA on 04/04/2020 was 12.2.  CEA improved to 1.7 on 05/13/2020. -PET scan on 05/11/2020 shows mild FDG uptake associated with the anastomotic site.  Small pulmonary nodules that do not show elevated FDG activity, remain nonspecific, subcentimeter.  Persistent but improved bladder wall thickening. -Cycle 1 of dose reduced FOLFOX on 05/19/2020, followed by 2 hospitalizations, 1 from acute kidney injury from diarrhea and second admission for C. difficile colitis. -Cycle 2 of 5-FU and leucovorin dose reduced on 06/30/2020.  Chemotherapy discontinued secondary to intolerance. -Follow-up CT scan on 11/02/2020 with multiple soft tissue lesions in the central and right mesentery measuring up to 3.1 x 1.7 cm.  Mild lymphadenopathy in the hepatic duodenal ligament, retroperitoneal space, right common iliac chain, bilateral external iliac chains.  17 mm soft tissue nodule in the lower anterior abdominal wall.  Bilateral tiny pulmonary nodules not substantially changed.  2.9 x  2.5 cm low-density lesion along the posterior uterus is indeterminate. -Pembrolizumab started on 11/10/2020. -CT CAP from 02/09/2021 showed interval near complete resolution of previously demonstrated nodal mass  in the ileocolonic mesentery.  Residual ill-defined nodule medial to the tip of the right hepatic lobe.  Stable small pulmonary nodules bilaterally consistent with benign findings.   2.  Family history: -Paternal uncle had colon cancer.  Maternal aunt had brain cancer and another maternal aunt had gynecological malignancy.  Father had lung cancer and was a smoker. - Genetic testing did not reveal any notable mutations.   3.  Diffuse erythema and nodularity of the dome of the bladder: -CT scan showed severely thickened bladder wall. -Cystoscopy on 04/08/2020 showed diffuse erythema, nodularity in the posterior wall tracking to the dome.  Ureteral orifices were in normal locations.  Biopsies were benign.   PLAN:  1.  Stage IV right colon adenocarcinoma, MSI-high: - CT CAP on 09/29/2021-no new or progressive findings.  Soft tissue lesion adjacent to the ileocolic anastomosis unchanged.  Scattered tiny bilateral lung nodules stable. - She does not have any immunotherapy related side effects. - She reportedly had COVID infection on 12/07/2021, mild. - Reviewed labs from today which showed normal LFTs.  Creatinine slightly elevated at 1.08.  CBC was grossly normal.  TSH was 1.6. - Recommend proceeding with her treatment today and in 3 weeks.  RTC 6 weeks for follow-up.  Plan to repeat CT CAP with contrast and labs.   2.  Paroxysmal atrial fibrillation: - Continue Eliquis.  No bleeding issues.   3.  Hypertension: - Continue Hyzaar and amlodipine.  4.  Hypokalemia: - Continue potassium 40 mEq daily.  Potassium today 3.4.   Orders placed this encounter:  No orders of the defined types were placed in this encounter.    Derek Jack, MD [place] [phone]   I, Thana Ates, am acting as a scribe for Dr. Derek Jack.  I, Derek Jack MD, have reviewed the above documentation for accuracy and completeness, and I agree with the above.

## 2021-12-29 NOTE — Progress Notes (Signed)
Patient presents today for Keytruda infusion per providers order.  Vital signs and labs reveiwed by MD.  Message received from Anastasio Champion RN/Dr. Delton Coombes patient okay for treatment.  Dye study on 12/08/21 to evaluate Portacath, impression showed possible thrombus or fibrin.  Okay to use per Dr. Delton Coombes.    Keytruda given today per MD orders.  Stable during infusion without adverse affects.  Vital signs stable.  No complaints at this time.  Discharge from clinic ambulatory in stable condition.  Alert and oriented X 3.  Follow up with [facility] as scheduled.

## 2021-12-29 NOTE — Progress Notes (Signed)
Patient has been examined by Dr. Katragadda, and vital signs and labs have been reviewed. ANC, Creatinine, LFTs, hemoglobin, and platelets are within treatment parameters per M.D. - pt may proceed with treatment.    °

## 2021-12-29 NOTE — Patient Instructions (Signed)
[facility]  Discharge Instructions: Thank you for choosing [facility] to provide your oncology and hematology care.  If you have a lab appointment with the Cancer Center, please come in thru the Main Entrance and check in at the main information desk.  Wear comfortable clothing and clothing appropriate for easy access to any Portacath or PICC line.   We strive to give you quality time with your provider. You may need to reschedule your appointment if you arrive late (15 or more minutes).  Arriving late affects you and other patients whose appointments are after yours.  Also, if you miss three or more appointments without notifying the office, you may be dismissed from the clinic at the provider's discretion.      For prescription refill requests, have your pharmacy contact our office and allow 72 hours for refills to be completed.    Today you received the following chemotherapy and/or immunotherapy agents Keytruda       To help prevent nausea and vomiting after your treatment, we encourage you to take your nausea medication as directed.  BELOW ARE SYMPTOMS THAT SHOULD BE REPORTED IMMEDIATELY: *FEVER GREATER THAN 100.4 F (38 C) OR HIGHER *CHILLS OR SWEATING *NAUSEA AND VOMITING THAT IS NOT CONTROLLED WITH YOUR NAUSEA MEDICATION *UNUSUAL SHORTNESS OF BREATH *UNUSUAL BRUISING OR BLEEDING *URINARY PROBLEMS (pain or burning when urinating, or frequent urination) *BOWEL PROBLEMS (unusual diarrhea, constipation, pain near the anus) TENDERNESS IN MOUTH AND THROAT WITH OR WITHOUT PRESENCE OF ULCERS (sore throat, sores in mouth, or a toothache) UNUSUAL RASH, SWELLING OR PAIN  UNUSUAL VAGINAL DISCHARGE OR ITCHING   Items with * indicate a potential emergency and should be followed up as soon as possible or go to the Emergency Department if any problems should occur.  Please show the CHEMOTHERAPY ALERT CARD or IMMUNOTHERAPY ALERT CARD at check-in to the Emergency  Department and triage nurse.  Should you have questions after your visit or need to cancel or reschedule your appointment, please contact [facility] [phone]  and follow the prompts.  Office hours are 8:00 a.m. to 4:30 p.m. Monday - Friday. Please note that voicemails left after 4:00 p.m. may not be returned until the following business day.  We are closed weekends and major holidays. You have access to a nurse at all times for urgent questions. Please call the main number to the clinic [phone] and follow the prompts.  For any non-urgent questions, you may also contact your provider using MyChart. We now offer e-Visits for anyone 18 and older to request care online for non-urgent symptoms. For details visit [email or web address].   Also download the MyChart app! Go to the app store, search "MyChart", open the app, select [facility], and log in with your MyChart username and password.  Due to Covid, a mask is required upon entering the hospital/clinic. If you do not have a mask, one will be given to you upon arrival. For doctor visits, patients may have 1 support person aged 18 or older with them. For treatment visits, patients cannot have anyone with them due to current Covid guidelines and our immunocompromised population.  

## 2021-12-29 NOTE — Patient Instructions (Signed)
[place] at [facility] Discharge Instructions   You were seen and examined today by Dr. Delton Coombes.  We will proceed with treatment today  REturn as scheduled for lab work and treatment.    Thank you for choosing [place] at [facility] to provide your oncology and hematology care.  To afford each patient quality time with our provider, please arrive at least 15 minutes before your scheduled appointment time.   If you have a lab appointment with the [facility] please come in thru the Main Entrance and check in at the main information desk.  You need to re-schedule your appointment should you arrive 10 or more minutes late.  We strive to give you quality time with our providers, and arriving late affects you and other patients whose appointments are after yours.  Also, if you no show three or more times for appointments you may be dismissed from the clinic at the providers discretion.     Again, thank you for choosing [facility].  Our hope is that these requests will decrease the amount of time that you wait before being seen by our physicians.       _____________________________________________________________  Should you have questions after your visit to [facility], please contact our office at [phone] and follow the prompts.  Our office hours are 8:00 a.m. and 4:30 p.m. Monday - Friday.  Please note that voicemails left after 4:00 p.m. may not be returned until the following business day.  We are closed weekends and major holidays.  You do have access to a nurse 24-7, just call the main number to the clinic [phone] and do not press any options, hold on the line and a nurse will answer the phone.    For prescription refill requests, have your pharmacy contact our office and allow 72 hours.    Due to Covid, you will need to wear a mask upon entering the hospital. If you do not have a mask, a mask  will be given to you at the Main Entrance upon arrival. For doctor visits, patients may have 1 support person age 43 or older with them. For treatment visits, patients can not have anyone with them due to social distancing guidelines and our immunocompromised population.

## 2021-12-29 NOTE — Progress Notes (Signed)
Okay to proceed with treatment per Dr.K. ?

## 2021-12-30 ENCOUNTER — Telehealth: Payer: Self-pay | Admitting: Family Medicine

## 2021-12-30 ENCOUNTER — Other Ambulatory Visit (INDEPENDENT_AMBULATORY_CARE_PROVIDER_SITE_OTHER): Payer: Self-pay | Admitting: Ophthalmology

## 2021-12-30 LAB — CEA: CEA: 1.9 ng/mL (ref 0.0–4.7)

## 2021-12-30 NOTE — Telephone Encounter (Signed)
°  No answer unable to leave a message for patient to call back and schedule Medicare Annual Wellness Visit (AWV) to be completed by video or phone.  No hx of AWV eligible for AWVI as of 11/01/2013per [facility]  Please schedule at anytime with [place] --- Karle Starch  45 Minutes appointment   Any questions, please call me at [phone]

## 2022-01-13 NOTE — Progress Notes (Signed)
[facility] Note  01/20/2022     CHIEF COMPLAINT Patient presents for Retina Follow Up    HISTORY OF PRESENT ILLNESS: Gabrielle [name] is a 76 y.o. female who presents to the clinic today for:   HPI     Retina Follow Up   Patient presents with  Wet AMD.  In both eyes.  This started 5 weeks ago.  I, the attending physician,  performed the HPI with the patient and updated documentation appropriately.        Comments   Patient here for 5 weeks for retina follow up for exu ARMD OU. Patient states vision about the same. No eye pain.       Last edited by Bernarda Caffey, MD on 01/20/2022  3:18 PM.    Pt states vision is about the same  Referring physician: Janora Norlander, [place],  [place] [id number]  HISTORICAL INFORMATION:   Selected notes from the MEDICAL RECORD NUMBER Referred by Dr. Cristela Blue for concern of ARMD OU   CURRENT MEDICATIONS: Current Outpatient Medications (Ophthalmic Drugs)  Medication Sig   dorzolamide-timolol (COSOPT) 22.3-6.8 MG/ML ophthalmic solution INSTILL ONE DROP IN [facility] EYE TWICE DAILY   No current facility-administered medications for this visit. (Ophthalmic Drugs)   Current Outpatient Medications (Other)  Medication Sig   alendronate (FOSAMAX) 70 MG tablet Take 1 tablet (70 mg total) by mouth every 7 (seven) days. Take with a full glass of water on an empty stomach.   amLODipine (NORVASC) 5 MG tablet Take 1 tablet (5 mg total) by mouth daily.   atorvastatin (LIPITOR) 20 MG tablet Take 1 tablet (20 mg total) by mouth daily.   cholecalciferol (VITAMIN D3) 25 MCG (1000 UT) tablet Take 1,000 Units by mouth daily.   CRANBERRY FRUIT PO Take 1 tablet by mouth daily.   ELIQUIS 5 MG TABS tablet Take 1 tablet (5 mg total) by mouth 2 (two) times daily.   FEROSUL 325 (65 Fe) MG tablet TAKE  (1)  TABLET TWICE A DAY WITH MEALS (BREAKFAST AND SUPPER)   Garlic 417 MG TABS Take by mouth.   lidocaine-prilocaine  (EMLA) cream Apply a small amount to port a cath site and cover with plastic wrap 1 hour prior to chemotherapy appointments   losartan-hydrochlorothiazide (HYZAAR) 100-25 MG tablet Take 1 tablet by mouth daily.   potassium chloride SA (KLOR-CON) 20 MEQ tablet Take 1 tablet (20 mEq total) by mouth 2 (two) times daily.   benzonatate (TESSALON PERLES) 100 MG capsule Take 1 capsule (100 mg total) by mouth 3 (three) times daily as needed. (Patient not taking: Reported on 12/29/2021)   diclofenac Sodium (VOLTAREN) 1 % GEL APPLY 4 GRAMS TO AFFECTED AREA(S) 4 TIMES A DAY   prochlorperazine (COMPAZINE) 10 MG tablet Take 1 tablet (10 mg total) by mouth every 6 (six) hours as needed for nausea or vomiting. (Patient not taking: Reported on 12/29/2021)   traMADol (ULTRAM) 50 MG tablet Take 1 tablet (50 mg total) by mouth 2 (two) times daily as needed. (Patient not taking: Reported on 12/29/2021)   No current facility-administered medications for this visit. (Other)   REVIEW OF SYSTEMS: ROS   Positive for: Gastrointestinal, Genitourinary, Musculoskeletal, Cardiovascular, Eyes Negative for: Constitutional, Neurological, Skin, HENT, Endocrine, Respiratory, Psychiatric, Allergic/Imm, Heme/Lymph Last edited by Theodore Demark, COA on 01/20/2022  8:16 AM.     ALLERGIES Allergies  Allergen Reactions   Lisinopril Cough   PAST MEDICAL  HISTORY Past Medical History:  Diagnosis Date   Arthritis    Cancer [facility])    Cataract    OU   Colon cancer ([place])    Family history of pancreatic cancer 08/21/2020   Family history of uterine cancer 08/21/2020   H/O cesarean section    Hx of tonsillectomy    Hyperlipidemia    Hypertension    Macular degeneration    Exu ARMD OU   Paroxysmal atrial fibrillation ([place]) 07/05/2020   Past Surgical History:  Procedure Laterality Date   BIOPSY  04/06/2020   Procedure: BIOPSY;  Surgeon: Rogene Houston, MD;  Location: [facility] END[facility];  Service: Endoscopy;;   CARPAL TUNNEL RELEASE  Right    CATARACT EXTRACTION W/PHACO Left 10/11/2019   Procedure: CATARACT EXTRACTION PHACO AND INTRAOCULAR LENS PLACEMENT ([place]);  Surgeon: Baruch Goldmann, MD;  Location: [facility] ORS;  Service: Ophthalmology;  Laterality: Left;  CDE: 8.56   CATARACT EXTRACTION W/PHACO Right 10/25/2019   Procedure: CATARACT EXTRACTION PHACO AND INTRAOCULAR LENS PLACEMENT (IOC);  Surgeon: Baruch Goldmann, MD;  Location: [facility] ORS;  Service: Ophthalmology;  Laterality: Right;  CDE: 5.67   CESAREAN SECTION     COLONOSCOPY N/A 04/06/2020   Procedure: COLONOSCOPY;  Surgeon: Rogene Houston, MD;  Location: [facility] END[facility] SUITE;  Service: Endoscopy;  Laterality: N/A;   CYSTOSCOPY WITH BIOPSY N/A 04/08/2020   Procedure: CYSTOSCOPY WITH BIOPSY;  Surgeon: Cleon Gustin, MD;  Location: [facility] ORS;  Service: Urology;  Laterality: N/A;   ESOPHAGOGASTRODUODENOSCOPY N/A 04/05/2020   Procedure: ESOPHAGOGASTRODUODENOSCOPY (EGD);  Surgeon: Rogene Houston, MD;  Location: [facility];  Service: Endoscopy;  Laterality: N/A;   PARTIAL COLECTOMY N/A 04/08/2020   Procedure: PARTIAL COLECTOMY;  Surgeon: Virl Cagey, MD;  Location: [facility] ORS;  Service: General;  Laterality: N/A;   PORTACATH PLACEMENT Left 05/18/2020   Procedure: INSERTION PORT-A-CATH (ATTACHED CATHETER IN LEFT SUBCLAVIAN);  Surgeon: Virl Cagey, MD;  Location: [facility] ORS;  Service: General;  Laterality: Left;   TONSILLECTOMY     FAMILY HISTORY Family History  Problem Relation Age of Onset   Macular degeneration Mother    Stroke Mother    Hypertension Mother    Dementia Mother    Lung cancer Father        dx late 26s; smoking hx   Diabetes Sister    Hypertension Sister    Leukemia Paternal Uncle        d. 40s   Cancer Maternal Aunt        ovarian or endometrial dx 37s   Pancreatic cancer Paternal Uncle        d. late 20s   SOCIAL HISTORY Social History   Tobacco Use   Smoking status: Never   Smokeless tobacco: Never  Vaping Use   Vaping Use: Never used   Substance Use Topics   Alcohol use: No   Drug use: No       OPHTHALMIC EXAM: Base Eye Exam     Visual Acuity (Snellen - Linear)       Right Left   Dist cc CF at 3' 20/80 -2   Dist ph cc NI NI    Correction: Glasses         Tonometry (Tonopen, 8:14 AM)       Right Left   Pressure 20 16         Pupils       Dark Light Shape React APD   Right 3 2 Round Brisk None  Left 3 2 Round Brisk None         Visual Fields (Counting fingers)       Left Right    Full Full         Extraocular Movement       Right Left    Full, Ortho Full, Ortho         Neuro/Psych     Oriented x3: Yes   Mood/Affect: Normal         Dilation     Both eyes: 1.0% Mydriacyl, 2.5% Phenylephrine @ 8:14 AM           Slit Lamp and Fundus Exam     Slit Lamp Exam       Right Left   Lids/Lashes Dermatochalasis - upper lid Dermatochalasis - upper lid   Conjunctiva/Sclera 1+ Injection White and quiet   Cornea Arcus, 3+ fine Punctpate epithelial erosions Arcus, 3+ fine Punctate epithelial erosions, mild tear film debris   Anterior Chamber Deep and quiet Deep and quiet   Iris Round and well dilated Round and well dilated   Lens PCIOL in good position, 1+ Posterior capsular opacification PC IOL in good position with open PC   Anterior Vitreous Vitreous syneresis, Posterior vitreous detachment, vitreous condensations Vitreous syneresis, Posterior vitreous detachment, silicone oil micro drops         Fundus Exam       Right Left   Disc pink and sharp, compact mild pallor, sharp, Compact, vascular loops superiorly   C/D Ratio 0.2 0.2   Macula Blunted foveal reflex, Drusen, Pigment clumping and atrophy, central thickening/pigmented disciform scar, +PED, no heme, persistent, trace cystic changes overlying central scar - slightly improved Blunted foveal reflex, central thickening, RPE clumping and atrophy, Drusen, RPE rip, trace cystic changes overlying PED - improved, no heme    Vessels Vascular attenuation, Tortuous Vascular attenuation, Tortuous   Periphery Attached, mild Reticular degeneration Attached, mild Reticular degeneration           Refraction     Wearing Rx       Sphere Cylinder Add   Right [place] Sphere +3.50   Left -0.50 Sphere +3.50    Type: PAL            IMAGING AND PROCEDURES  Imaging and Procedures for 03/21/18  OCT, Retina - OU - Both Eyes       Right Eye Quality was good. Central Foveal Thickness: 540. Progression has been stable. Findings include pigment epithelial detachment, epiretinal membrane, subretinal hyper-reflective material, retinal drusen , abnormal foveal contour, no SRF, intraretinal fluid, disciform scar, outer retinal tubulation, outer retinal atrophy (Stable sub-retinal scar, mild persistent cystic changes overlying).   Left Eye Quality was good. Central Foveal Thickness: 313. Progression has been stable. Findings include subretinal hyper-reflective material, pigment epithelial detachment, intraretinal fluid, retinal drusen , outer retinal atrophy, no SRF, abnormal foveal contour (persistent cystic changes overlying PED/SRHM -- ?mild improvement in cystic changes).   Notes *Images captured and stored on drive  Diagnosis / Impression:  Exudative ARMD OU OD: Stable sub-retinal scar, mild persistent cystic changes overlying OS: persistent cystic changes overlying PED/SRHM -- ?mild improvement in cystic changes  Clinical management:  See below  Abbreviations: NFP - Normal foveal profile. CME - cystoid macular edema. PED - pigment epithelial detachment. IRF - intraretinal fluid. SRF - subretinal fluid. EZ - ellipsoid zone. ERM - epiretinal membrane. ORA - outer retinal atrophy. ORT - outer retinal tubulation. SRHM - subretinal hyper-reflective material  Intravitreal Injection, Pharmacologic Agent - OS - Left Eye       Time Out 01/20/2022. 8:37 AM. Confirmed correct patient, procedure, site, and  patient consented.   Anesthesia Topical anesthesia was used. Anesthetic medications included Lidocaine 2%, Proparacaine 0.5%.   Procedure Preparation included 5% betadine to ocular surface, eyelid speculum. A (32g) needle was used.   Injection: 2 mg aflibercept 2 MG/0.05ML   Route: Intravitreal, Site: Left Eye   NDC: [id number], Lot: [id number], Expiration date: 11/04/2022, Waste: 0.05 mL   Post-op Post injection exam found visual acuity of at least counting fingers. The patient tolerated the procedure well. There were no complications. The patient received written and verbal post procedure care education. Post injection medications were not given.              ASSESSMENT/PLAN:    ICD-10-CM   1. Exudative age-related macular degeneration of both eyes with active choroidal neovascularization (HCC)  H35.3231 OCT, Retina - OU - Both Eyes    Intravitreal Injection, Pharmacologic Agent - OS - Left Eye    aflibercept (EYLEA) SOLN 2 mg    2. Posterior vitreous detachment of both eyes  H43.813     3. Pseudophakia of both eyes  Z96.1     4. Ocular hypertension, bilateral  H40.053     5. Dry eyes  H04.123      1. Exudative age related macular degeneration, both eyes.    - severe exudative disease with very active CNVM OU at presentation in January 2019  - S/P IVA OD #1 (01.04.19), #2 (02.15.19)  - S/P IVA OS #1 (01.18.19), #2 (02.15.19)  - switched to Eylea 3.18.19 due to severity of disease  - S/P IVE OD #1 (03.18.19), #2 (04.17.19), #3 (05.15.19), #4 (10.02.19),  #5 (01.15.20), #6 (06.04.20), #7 (10.29.20), #8 (01.21.21), #9 (05.27.21), #10 (07.22.21) -- injections held due to stable disciform scar  - S/P IVE OS #1 (03.18.19), #2 (04.17.19), #3 (05.15.19), #4 (06.12.19), #5 (07.10.19), #6 (08.07.19), #7 (09.04.19), #8 (10.02.19), #9 (11.06.19), #10 (12.11.19), #11 (01.15.20), #12 (02.20.20), #13 (03.26.20), #14 (04.30.20), #15 (06.04.20), #16 (07.16.20), # 17 (08.20.20), #18  (09.24.20), #19 (10.29.20), #20 (12.03.20), #21 (01.21.21), #22 (03.18.21), #23 (05.27.21), #24 (07.22.21), #25 (11.11.21), #26 (12.09.21), #27 (01.06.22), #28 (2.7.22), #29 (03.24.22), #30 (4.28.22), #31 (05.31.22), #32 (06.30.22), #33 (08.04.22), #34 (09.15.22), #35 (10.27.22), #36 (12.08.22), #37 (01.12.23)  - IVE OS held from 7.22.21 to 11.11.21 due to TIA/stroke on 8.1.21  - OCT OS: persistent cystic changes overlying PED/SRHM -- ?mild improvement in cystic changes at 5 weeks  - exam OS shows faint IRH nasal fovea resolved today  - OD: Stable sub-retinal scar, mild persistent cystic changes overlying  - BCVA OD stable at CF 3'; OS 20/80 stable  - recommend IVE OS #38 today, 02.16.23 w/ f/u 6 wks - will cont to hold OD  - Eylea informed consent form signed and scanned on 01.21.2021  - Eylea4U paperwork filled out on 02.15.19 and fully approved through [facility] [employer] -- approved  - f/u in 6 weeks, sooner prn for DFE/OCT/possible injection(s)  2. PVD / vitreous syneresis  - Discussed findings and prognosis   - No RT or RD on 360 exam  - Reviewed s/s of RT/RD  - Strict return precautions for any such RT/RD signs/symptoms  3. Pseudophakia OU  - s/p CE/IOL (Dr. Marisa Hua, 11.2020)  - beautiful surgeries w/ IOLs in excellent position, doing well  - monitor   4. Ocular Hypertension OU  - IOP 20,16  -  cont Cosopt BID OU  5. Dry eyes OU - recommend artificial tears and lubricating ointment as needed  Ophthalmic Meds Ordered this visit:  Meds ordered this encounter  Medications   aflibercept (EYLEA) SOLN 2 mg     Return in about 6 weeks (around 03/03/2022) for f/u exu ARMD OU, DFE, OCT.  There are no Patient Instructions on file for this visit.  This document serves as a record of services personally performed by Gardiner Sleeper, MD, PhD. It was created on their behalf by San Jetty. Owens Shark, OA an ophthalmic technician. The creation of this record is the provider's dictation and/or activities  during the visit.    Electronically signed by: San Jetty. Owens Shark, [place] 02.09.2023 3:26 PM  Gardiner Sleeper, M.D., Ph.D. Diseases & Surgery of the Retina and Vitreous [facility]  I have reviewed the above documentation for accuracy and completeness, and I agree with the above. Gardiner Sleeper, M.D., Ph.D. 01/20/22 3:26 PM  Abbreviations: M myopia (nearsighted); A astigmatism; H hyperopia (farsighted); P presbyopia; Mrx spectacle prescription;  CTL contact lenses; OD right eye; OS left eye; OU both eyes  XT exotropia; ET esotropia; PEK punctate epithelial keratitis; PEE punctate epithelial erosions; DES dry eye syndrome; MGD meibomian gland dysfunction; ATs artificial tears; PFAT's preservative free artificial tears; [place] nuclear sclerotic cataract; PSC posterior subcapsular cataract; ERM epi-retinal membrane; PVD posterior vitreous detachment; RD retinal detachment; DM diabetes mellitus; DR diabetic retinopathy; NPDR non-proliferative diabetic retinopathy; PDR proliferative diabetic retinopathy; CSME clinically significant macular edema; DME diabetic macular edema; dbh dot blot hemorrhages; CWS cotton wool spot; POAG primary open angle glaucoma; C/D cup-to-disc ratio; HVF humphrey visual field; GVF goldmann visual field; OCT optical coherence tomography; IOP intraocular pressure; BRVO Branch retinal vein occlusion; CRVO central retinal vein occlusion; CRAO central retinal artery occlusion; BRAO branch retinal artery occlusion; RT retinal tear; SB scleral buckle; PPV pars plana vitrectomy; VH Vitreous hemorrhage; PRP panretinal laser photocoagulation; IVK intravitreal kenalog; VMT vitreomacular traction; MH Macular hole;  NVD neovascularization of the disc; NVE neovascularization elsewhere; AREDS age related eye disease study; ARMD age related macular degeneration; POAG primary open angle glaucoma; EBMD epithelial/anterior basement membrane dystrophy; ACIOL anterior chamber intraocular  lens; IOL intraocular lens; PCIOL posterior chamber intraocular lens; Phaco/IOL phacoemulsification with intraocular lens placement; [place] photorefractive keratectomy; LASIK laser assisted in situ keratomileusis; HTN hypertension; DM diabetes mellitus; COPD chronic obstructive pulmonary disease

## 2022-01-19 ENCOUNTER — Inpatient Hospital Stay (HOSPITAL_COMMUNITY): Payer: Medicare HMO | Attending: Hematology

## 2022-01-19 ENCOUNTER — Inpatient Hospital Stay (HOSPITAL_COMMUNITY): Payer: Medicare HMO

## 2022-01-19 ENCOUNTER — Encounter: Payer: Self-pay | Admitting: *Deleted

## 2022-01-19 ENCOUNTER — Other Ambulatory Visit: Payer: Self-pay

## 2022-01-19 VITALS — BP 162/71 | HR 72 | Temp 97.7°F | Resp 18 | Ht 61.5 in | Wt 172.2 lb

## 2022-01-19 DIAGNOSIS — C182 Malignant neoplasm of ascending colon: Secondary | ICD-10-CM | POA: Diagnosis not present

## 2022-01-19 DIAGNOSIS — C189 Malignant neoplasm of colon, unspecified: Secondary | ICD-10-CM

## 2022-01-19 DIAGNOSIS — C779 Secondary and unspecified malignant neoplasm of lymph node, unspecified: Secondary | ICD-10-CM | POA: Diagnosis not present

## 2022-01-19 DIAGNOSIS — Z5112 Encounter for antineoplastic immunotherapy: Secondary | ICD-10-CM | POA: Insufficient documentation

## 2022-01-19 DIAGNOSIS — E876 Hypokalemia: Secondary | ICD-10-CM | POA: Diagnosis not present

## 2022-01-19 DIAGNOSIS — Z79899 Other long term (current) drug therapy: Secondary | ICD-10-CM | POA: Diagnosis not present

## 2022-01-19 LAB — COMPREHENSIVE METABOLIC PANEL
ALT: 17 U/L (ref 0–44)
AST: 22 U/L (ref 15–41)
Albumin: 4.2 g/dL (ref 3.5–5.0)
Alkaline Phosphatase: 79 U/L (ref 38–126)
Anion gap: 13 (ref 5–15)
BUN: 18 mg/dL (ref 8–23)
CO2: 26 mmol/L (ref 22–32)
Calcium: 9.8 mg/dL (ref 8.9–10.3)
Chloride: 97 mmol/L — ABNORMAL LOW (ref 98–111)
Creatinine, Ser: 1.04 mg/dL — ABNORMAL HIGH (ref 0.44–1.00)
GFR, Estimated: 56 mL/min — ABNORMAL LOW (ref >60)
Glucose, Bld: 101 mg/dL — ABNORMAL HIGH (ref 70–99)
Potassium: 3.3 mmol/L — ABNORMAL LOW (ref 3.5–5.1)
Sodium: 136 mmol/L (ref 135–145)
Total Bilirubin: 0.5 mg/dL (ref 0.3–1.2)
Total Protein: 7.5 g/dL (ref 6.5–8.1)

## 2022-01-19 LAB — CBC WITH DIFFERENTIAL/PLATELET
Abs Immature Granulocytes: 0.09 10*3/uL — ABNORMAL HIGH (ref 0.00–0.07)
Basophils Absolute: 0 10*3/uL (ref 0.0–0.1)
Basophils Relative: 1 %
Eosinophils Absolute: 0.2 10*3/uL (ref 0.0–0.5)
Eosinophils Relative: 3 %
HCT: 38.2 % (ref 36.0–46.0)
Hemoglobin: 13 g/dL (ref 12.0–15.0)
Immature Granulocytes: 1 %
Lymphocytes Relative: 23 %
Lymphs Abs: 1.5 10*3/uL (ref 0.7–4.0)
MCH: 31.9 pg (ref 26.0–34.0)
MCHC: 34 g/dL (ref 30.0–36.0)
MCV: 93.9 fL (ref 80.0–100.0)
Monocytes Absolute: 0.6 10*3/uL (ref 0.1–1.0)
Monocytes Relative: 9 %
Neutro Abs: 4.1 10*3/uL (ref 1.7–7.7)
Neutrophils Relative %: 63 %
Platelets: 147 10*3/uL — ABNORMAL LOW (ref 150–400)
RBC: 4.07 MIL/uL (ref 3.87–5.11)
RDW: 13 % (ref 11.5–15.5)
WBC: 6.5 10*3/uL (ref 4.0–10.5)
nRBC: 0 % (ref 0.0–0.2)

## 2022-01-19 LAB — TSH: TSH: 1.337 u[IU]/mL (ref 0.350–4.500)

## 2022-01-19 LAB — MAGNESIUM: Magnesium: 1.9 mg/dL (ref 1.7–2.4)

## 2022-01-19 MED ORDER — SODIUM CHLORIDE 0.9 % IV SOLN: Freq: Once | INTRAVENOUS | Status: AC

## 2022-01-19 MED ORDER — HEPARIN SOD (PORK) LOCK FLUSH 100 UNIT/ML IV SOLN
500.0000 [IU] | Freq: Once | INTRAVENOUS | Status: AC | PRN
  Administered 2022-01-19: 500 [IU]

## 2022-01-19 MED ORDER — SODIUM CHLORIDE 0.9% FLUSH
10.0000 mL | INTRAVENOUS | Status: DC | PRN
  Administered 2022-01-19: 10 mL

## 2022-01-19 MED ORDER — SODIUM CHLORIDE 0.9 % IV SOLN
200.0000 mg | Freq: Once | INTRAVENOUS | Status: AC
  Administered 2022-01-19: 200 mg via INTRAVENOUS
  Filled 2022-01-19: qty 8

## 2022-01-19 NOTE — Patient Instructions (Signed)
[facility]  Discharge Instructions: Thank you for choosing [facility] to provide your oncology and hematology care.  If you have a lab appointment with the Cancer Center, please come in thru the Main Entrance and check in at the main information desk.  Wear comfortable clothing and clothing appropriate for easy access to any Portacath or PICC line.   We strive to give you quality time with your provider. You may need to reschedule your appointment if you arrive late (15 or more minutes).  Arriving late affects you and other patients whose appointments are after yours.  Also, if you miss three or more appointments without notifying the office, you may be dismissed from the clinic at the provider's discretion.      For prescription refill requests, have your pharmacy contact our office and allow 72 hours for refills to be completed.        To help prevent nausea and vomiting after your treatment, we encourage you to take your nausea medication as directed.  BELOW ARE SYMPTOMS THAT SHOULD BE REPORTED IMMEDIATELY: *FEVER GREATER THAN 100.4 F (38 C) OR HIGHER *CHILLS OR SWEATING *NAUSEA AND VOMITING THAT IS NOT CONTROLLED WITH YOUR NAUSEA MEDICATION *UNUSUAL SHORTNESS OF BREATH *UNUSUAL BRUISING OR BLEEDING *URINARY PROBLEMS (pain or burning when urinating, or frequent urination) *BOWEL PROBLEMS (unusual diarrhea, constipation, pain near the anus) TENDERNESS IN MOUTH AND THROAT WITH OR WITHOUT PRESENCE OF ULCERS (sore throat, sores in mouth, or a toothache) UNUSUAL RASH, SWELLING OR PAIN  UNUSUAL VAGINAL DISCHARGE OR ITCHING   Items with * indicate a potential emergency and should be followed up as soon as possible or go to the Emergency Department if any problems should occur.  Please show the CHEMOTHERAPY ALERT CARD or IMMUNOTHERAPY ALERT CARD at check-in to the Emergency Department and triage nurse.  Should you have questions after your visit or need to cancel  or reschedule your appointment, please contact [facility] [phone]  and follow the prompts.  Office hours are 8:00 a.m. to 4:30 p.m. Monday - Friday. Please note that voicemails left after 4:00 p.m. may not be returned until the following business day.  We are closed weekends and major holidays. You have access to a nurse at all times for urgent questions. Please call the main number to the clinic [phone] and follow the prompts.  For any non-urgent questions, you may also contact your provider using MyChart. We now offer e-Visits for anyone 18 and older to request care online for non-urgent symptoms. For details visit [email or web address].   Also download the MyChart app! Go to the app store, search "MyChart", open the app, select [facility], and log in with your MyChart username and password.  Due to Covid, a mask is required upon entering the hospital/clinic. If you do not have a mask, one will be given to you upon arrival. For doctor visits, patients may have 1 support person aged 18 or older with them. For treatment visits, patients cannot have anyone with them due to current Covid guidelines and our immunocompromised population.  

## 2022-01-19 NOTE — Progress Notes (Signed)
Labs reviewed, proceed with treatment per parameters. No new complaints from patient today.    Treatment given per orders. Patient tolerated it well without problems. Vitals stable and discharged home from clinic ambulatory. Follow up as scheduled.

## 2022-01-20 ENCOUNTER — Encounter (INDEPENDENT_AMBULATORY_CARE_PROVIDER_SITE_OTHER): Payer: Self-pay | Admitting: Ophthalmology

## 2022-01-20 ENCOUNTER — Ambulatory Visit (INDEPENDENT_AMBULATORY_CARE_PROVIDER_SITE_OTHER): Payer: Medicare HMO | Admitting: Ophthalmology

## 2022-01-20 DIAGNOSIS — H353231 Exudative age-related macular degeneration, bilateral, with active choroidal neovascularization: Secondary | ICD-10-CM

## 2022-01-20 DIAGNOSIS — H40053 Ocular hypertension, bilateral: Secondary | ICD-10-CM | POA: Diagnosis not present

## 2022-01-20 DIAGNOSIS — H04123 Dry eye syndrome of bilateral lacrimal glands: Secondary | ICD-10-CM | POA: Diagnosis not present

## 2022-01-20 DIAGNOSIS — Z961 Presence of intraocular lens: Secondary | ICD-10-CM | POA: Diagnosis not present

## 2022-01-20 DIAGNOSIS — H43813 Vitreous degeneration, bilateral: Secondary | ICD-10-CM | POA: Diagnosis not present

## 2022-01-20 MED ORDER — AFLIBERCEPT 2MG/0.05ML IZ SOLN FOR KALEIDOSCOPE
2.0000 mg | INTRAVITREAL | Status: AC | PRN
  Administered 2022-01-20: 2 mg via INTRAVITREAL

## 2022-01-27 ENCOUNTER — Telehealth: Payer: Self-pay | Admitting: Family Medicine

## 2022-01-27 NOTE — Telephone Encounter (Signed)
°  Left message for patient to call back and schedule Medicare Annual Wellness Visit (AWV) to be completed by video or phone.  No hx of AWV eligible for AWVI per [facility] as of  10/05/2012  Please schedule at anytime with [place] --- Karle Starch  45 Minutes appointment   Any questions, please call me at [phone]

## 2022-02-01 ENCOUNTER — Ambulatory Visit (HOSPITAL_COMMUNITY): Payer: Medicare HMO

## 2022-02-08 ENCOUNTER — Other Ambulatory Visit: Payer: Self-pay

## 2022-02-08 ENCOUNTER — Ambulatory Visit (HOSPITAL_COMMUNITY)
Admission: RE | Admit: 2022-02-08 | Discharge: 2022-02-08 | Disposition: A | Discharge status: Home / Self Care | Payer: Medicare HMO | Source: Ambulatory Visit | Attending: Hematology | Admitting: Hematology

## 2022-02-08 DIAGNOSIS — Z85038 Personal history of other malignant neoplasm of large intestine: Secondary | ICD-10-CM | POA: Diagnosis not present

## 2022-02-08 DIAGNOSIS — I700 Atherosclerosis of aorta: Secondary | ICD-10-CM | POA: Diagnosis not present

## 2022-02-08 DIAGNOSIS — K760 Fatty (change of) liver, not elsewhere classified: Secondary | ICD-10-CM | POA: Diagnosis not present

## 2022-02-08 DIAGNOSIS — I2510 Atherosclerotic heart disease of native coronary artery without angina pectoris: Secondary | ICD-10-CM | POA: Diagnosis not present

## 2022-02-08 DIAGNOSIS — N3289 Other specified disorders of bladder: Secondary | ICD-10-CM | POA: Diagnosis not present

## 2022-02-08 DIAGNOSIS — C189 Malignant neoplasm of colon, unspecified: Secondary | ICD-10-CM | POA: Diagnosis not present

## 2022-02-08 DIAGNOSIS — I517 Cardiomegaly: Secondary | ICD-10-CM | POA: Diagnosis not present

## 2022-02-08 MED ORDER — IOHEXOL 300 MG/ML  SOLN
100.0000 mL | Freq: Once | INTRAMUSCULAR | Status: AC | PRN
  Administered 2022-02-08: 100 mL via INTRAVENOUS

## 2022-02-09 ENCOUNTER — Inpatient Hospital Stay (HOSPITAL_COMMUNITY): Payer: Medicare HMO | Attending: Hematology

## 2022-02-09 ENCOUNTER — Inpatient Hospital Stay (HOSPITAL_BASED_OUTPATIENT_CLINIC_OR_DEPARTMENT_OTHER): Payer: Medicare HMO | Admitting: Hematology

## 2022-02-09 ENCOUNTER — Inpatient Hospital Stay (HOSPITAL_COMMUNITY): Payer: Medicare HMO

## 2022-02-09 VITALS — BP 157/64 | HR 64 | Temp 97.7°F | Resp 18

## 2022-02-09 VITALS — BP 175/74 | HR 69 | Temp 98.0°F | Resp 18 | Ht 61.5 in | Wt 174.6 lb

## 2022-02-09 DIAGNOSIS — C189 Malignant neoplasm of colon, unspecified: Secondary | ICD-10-CM | POA: Diagnosis not present

## 2022-02-09 DIAGNOSIS — C182 Malignant neoplasm of ascending colon: Secondary | ICD-10-CM | POA: Insufficient documentation

## 2022-02-09 DIAGNOSIS — Z5112 Encounter for antineoplastic immunotherapy: Secondary | ICD-10-CM | POA: Insufficient documentation

## 2022-02-09 DIAGNOSIS — Z79899 Other long term (current) drug therapy: Secondary | ICD-10-CM | POA: Diagnosis not present

## 2022-02-09 LAB — CBC WITH DIFFERENTIAL/PLATELET
Abs Immature Granulocytes: 0.03 10*3/uL (ref 0.00–0.07)
Basophils Absolute: 0 10*3/uL (ref 0.0–0.1)
Basophils Relative: 1 %
Eosinophils Absolute: 0.3 10*3/uL (ref 0.0–0.5)
Eosinophils Relative: 5 %
HCT: 40 % (ref 36.0–46.0)
Hemoglobin: 13.3 g/dL (ref 12.0–15.0)
Immature Granulocytes: 1 %
Lymphocytes Relative: 25 %
Lymphs Abs: 1.5 10*3/uL (ref 0.7–4.0)
MCH: 31.4 pg (ref 26.0–34.0)
MCHC: 33.3 g/dL (ref 30.0–36.0)
MCV: 94.6 fL (ref 80.0–100.0)
Monocytes Absolute: 0.7 10*3/uL (ref 0.1–1.0)
Monocytes Relative: 11 %
Neutro Abs: 3.6 10*3/uL (ref 1.7–7.7)
Neutrophils Relative %: 57 %
Platelets: 150 10*3/uL (ref 150–400)
RBC: 4.23 MIL/uL (ref 3.87–5.11)
RDW: 13.3 % (ref 11.5–15.5)
WBC: 6.2 10*3/uL (ref 4.0–10.5)
nRBC: 0 % (ref 0.0–0.2)

## 2022-02-09 LAB — COMPREHENSIVE METABOLIC PANEL
ALT: 36 U/L (ref 0–44)
AST: 35 U/L (ref 15–41)
Albumin: 4.3 g/dL (ref 3.5–5.0)
Alkaline Phosphatase: 75 U/L (ref 38–126)
Anion gap: 9 (ref 5–15)
BUN: 19 mg/dL (ref 8–23)
CO2: 30 mmol/L (ref 22–32)
Calcium: 10.2 mg/dL (ref 8.9–10.3)
Chloride: 94 mmol/L — ABNORMAL LOW (ref 98–111)
Creatinine, Ser: 1.06 mg/dL — ABNORMAL HIGH (ref 0.44–1.00)
GFR, Estimated: 55 mL/min — ABNORMAL LOW (ref >60)
Glucose, Bld: 99 mg/dL (ref 70–99)
Potassium: 3.4 mmol/L — ABNORMAL LOW (ref 3.5–5.1)
Sodium: 133 mmol/L — ABNORMAL LOW (ref 135–145)
Total Bilirubin: 0.3 mg/dL (ref 0.3–1.2)
Total Protein: 8.2 g/dL — ABNORMAL HIGH (ref 6.5–8.1)

## 2022-02-09 LAB — MAGNESIUM: Magnesium: 1.9 mg/dL (ref 1.7–2.4)

## 2022-02-09 LAB — TSH: TSH: 2.152 u[IU]/mL (ref 0.350–4.500)

## 2022-02-09 MED ORDER — SODIUM CHLORIDE 0.9 % IV SOLN: Freq: Once | INTRAVENOUS | Status: AC

## 2022-02-09 MED ORDER — SODIUM CHLORIDE 0.9% FLUSH
10.0000 mL | INTRAVENOUS | Status: DC | PRN
  Administered 2022-02-09: 10 mL

## 2022-02-09 MED ORDER — HEPARIN SOD (PORK) LOCK FLUSH 100 UNIT/ML IV SOLN
500.0000 [IU] | Freq: Once | INTRAVENOUS | Status: AC | PRN
  Administered 2022-02-09: 500 [IU]

## 2022-02-09 MED ORDER — SODIUM CHLORIDE 0.9 % IV SOLN
200.0000 mg | Freq: Once | INTRAVENOUS | Status: AC
  Administered 2022-02-09: 200 mg via INTRAVENOUS
  Filled 2022-02-09: qty 8

## 2022-02-09 NOTE — Patient Instructions (Signed)
[place] at [facility] ?Discharge Instructions ? ? ?You were seen and examined today by Dr. Delton Coombes. ? ?He reviewed the results of your lab work and CT scan.  All results are normal/stable.  You CT scan shows that the cancer has not grown or spread. ? ?We will proceed with your infusion today. ? ?Return as scheduled.  ? ? ?Thank you for choosing [place] at [facility] to provide your oncology and hematology care.  To afford each patient quality time with our provider, please arrive at least 15 minutes before your scheduled appointment time.  ? ?If you have a lab appointment with the [facility] please come in thru the Main Entrance and check in at the main information desk. ? ?You need to re-schedule your appointment should you arrive 10 or more minutes late.  We strive to give you quality time with our providers, and arriving late affects you and other patients whose appointments are after yours.  Also, if you no show three or more times for appointments you may be dismissed from the clinic at the providers discretion.     ?Again, thank you for choosing [facility].  Our hope is that these requests will decrease the amount of time that you wait before being seen by our physicians.       ?_____________________________________________________________ ? ?Should you have questions after your visit to [facility], please contact our office at [phone] and follow the prompts.  Our office hours are 8:00 a.m. and 4:30 p.m. Monday - Friday.  Please note that voicemails left after 4:00 p.m. may not be returned until the following business day.  We are closed weekends and major holidays.  You do have access to a nurse 24-7, just call the main number to the clinic [phone] and do not press any options, hold on the line and a nurse will answer the phone.   ? ?For prescription refill requests, have your pharmacy contact our office and  allow 72 hours.   ? ?Due to Covid, you will need to wear a mask upon entering the hospital. If you do not have a mask, a mask will be given to you at the Main Entrance upon arrival. For doctor visits, patients may have 1 support person age 51 or older with them. For treatment visits, patients can not have anyone with them due to social distancing guidelines and our immunocompromised population.  ? ?   ?

## 2022-02-09 NOTE — Patient Instructions (Signed)
[place]  Discharge Instructions: ?Thank you for choosing [facility] to provide your oncology and hematology care.  ?If you have a lab appointment with the [facility], please come in thru the Main Entrance and check in at the main information desk. ? ?Wear comfortable clothing and clothing appropriate for easy access to any Portacath or PICC line.  ? ?We strive to give you quality time with your provider. You may need to reschedule your appointment if you arrive late (15 or more minutes).  Arriving late affects you and other patients whose appointments are after yours.  Also, if you miss three or more appointments without notifying the office, you may be dismissed from the clinic at the provider?s discretion.    ?  ?For prescription refill requests, have your pharmacy contact our office and allow 72 hours for refills to be completed.   ? ?Today you received the following [place], return as scheduled. ?  ?To help prevent nausea and vomiting after your treatment, we encourage you to take your nausea medication as directed. ? ?BELOW ARE SYMPTOMS THAT SHOULD BE REPORTED IMMEDIATELY: ?*FEVER GREATER THAN 100.4 F (38 ?C) OR HIGHER ?*CHILLS OR SWEATING ?*NAUSEA AND VOMITING THAT IS NOT CONTROLLED WITH YOUR NAUSEA MEDICATION ?*UNUSUAL SHORTNESS OF BREATH ?*UNUSUAL BRUISING OR BLEEDING ?*URINARY PROBLEMS (pain or burning when urinating, or frequent urination) ?*BOWEL PROBLEMS (unusual diarrhea, constipation, pain near the anus) ?TENDERNESS IN MOUTH AND THROAT WITH OR WITHOUT PRESENCE OF ULCERS (sore throat, sores in mouth, or a toothache) ?UNUSUAL RASH, SWELLING OR PAIN  ?UNUSUAL VAGINAL DISCHARGE OR ITCHING  ? ?Items with * indicate a potential emergency and should be followed up as soon as possible or go to the Emergency Department if any problems should occur. ? ?Please show the CHEMOTHERAPY ALERT CARD or IMMUNOTHERAPY ALERT CARD at check-in to the Emergency Department and triage  nurse. ? ?Should you have questions after your visit or need to cancel or reschedule your appointment, please contact [facility] [phone]  and follow the prompts.  Office hours are 8:00 a.m. to 4:30 p.m. Monday - Friday. Please note that voicemails left after 4:00 p.m. may not be returned until the following business day.  We are closed weekends and major holidays. You have access to a nurse at all times for urgent questions. Please call the main number to the clinic [phone] and follow the prompts. ? ?For any non-urgent questions, you may also contact your provider using MyChart. We now offer e-Visits for anyone 45 and older to request care online for non-urgent symptoms. For details visit [email or web address]. ?  ?Also download the MyChart app! Go to the app store, search "MyChart", open the app, select [facility], and log in with your MyChart username and password. ? ?Due to Covid, a mask is required upon entering the hospital/clinic. If you do not have a mask, one will be given to you upon arrival. For doctor visits, patients may have 1 support person aged 35 or older with them. For treatment visits, patients cannot have anyone with them due to current Covid guidelines and our immunocompromised population.  ?

## 2022-02-09 NOTE — Progress Notes (Signed)
° °[facility] °[address] °[place], [place] [address] ° ° °CLINIC:  °Medical Oncology/Hematology ° °PCP:  °Gottschalk, Ashly M, DO °[address] / [place]  [address] [phone] ° ° °REASON FOR VISIT:  °Follow-up for right colon cancer ° °PRIOR THERAPY:  °1. Right hemicolectomy on 04/08/2020. °2. FOLFOX x 3 cycles from 05/19/2020 to 07/14/2020. ° °NGS Results: Foundation 1 KRAS/NRAS wild-type, MSI--high, TMB 55 Muts/Mb, BRAF V 600 E ° °CURRENT THERAPY: Keytruda every 3 weeks ° °BRIEF ONCOLOGIC HISTORY:  °Oncology History  °Malignant neoplasm of colon (HCC)  °04/07/2020 Initial Diagnosis  ° Malignant neoplasm of ascending colon (HCC) °  °05/13/2020 Genetic Testing  ° Foundation One ° ° °  °05/19/2020 - 07/02/2020 Chemotherapy  ° The patient had palonosetron (ALOXI) injection 0.25 mg, 0.25 mg, Intravenous,  Once, 2 of 12 cycles °Administration: 0.25 mg (05/19/2020), 0.25 mg (06/30/2020) °leucovorin 522 mg in dextrose 5 % 250 mL infusion, 320 mg/m2 = 522 mg (80 % of original dose 400 mg/m2), Intravenous,  Once, 2 of 12 cycles °Dose modification: 320 mg/m2 (80 % of original dose 400 mg/m2, Cycle 1, Reason: Provider Judgment), 266.6667 mg/m2 (66.7 % of original dose 400 mg/m2, Cycle 2, Reason: Provider Judgment) °Administration: 522 mg (05/19/2020), 434 mg (06/30/2020) °oxaliplatin (ELOXATIN) 110 mg in dextrose 5 % 500 mL chemo infusion, 68 mg/m2 = 110 mg (80 % of original dose 85 mg/m2), Intravenous,  Once, 1 of 1 cycle °Dose modification: 68 mg/m2 (80 % of original dose 85 mg/m2, Cycle 1, Reason: Provider Judgment) °Administration: 110 mg (05/19/2020) °fluorouracil (ADRUCIL) chemo injection 500 mg, 320 mg/m2 = 500 mg (80 % of original dose 400 mg/m2), Intravenous,  Once, 2 of 12 cycles °Dose modification: 320 mg/m2 (80 % of original dose 400 mg/m2, Cycle 1, Reason: Provider Judgment), 266.6667 mg/m2 (66.7 % of original dose 400 mg/m2, Cycle 2, Reason: Provider Judgment) °Administration: 500 mg (05/19/2020), 450  mg (06/30/2020) °fluorouracil (ADRUCIL) 3,150 mg in sodium chloride 0.9 % 87 mL chemo infusion, 1,920 mg/m2 = 3,150 mg (80 % of original dose 2,400 mg/m2), Intravenous, 1 Day/Dose, 2 of 12 cycles °Dose modification: 1,920 mg/m2 (80 % of original dose 2,400 mg/m2, Cycle 1, Reason: Provider Judgment), 1,600 mg/m2 (66.7 % of original dose 2,400 mg/m2, Cycle 2, Reason: Provider Judgment) °Administration: 3,150 mg (05/19/2020), 2,600 mg (06/30/2020) ° ° for chemotherapy treatment.  ° °  °08/03/2020 Genetic Testing  ° [facility] Reveal Testing ° ° °  °08/14/2020 Genetic Testing  ° No pathogenic variants detected in [facility] Common Hereditary Cancers Panel.  Variant of uncertain significance (VUS) detected in HOXB13 at c.634G>A (p.Ala212Thr). The Common Hereditary Cancers Panel offered by [facility] includes sequencing and/or deletion duplication testing of the following 48 genes: APC, ATM, AXIN2, BARD1, BMPR1A, BRCA1, BRCA2, BRIP1, CDH1, CDK4, CDKN2A (p14ARF), CDKN2A (p16INK4a), CHEK2, CTNNA1, DICER1, EPCAM (Deletion/duplication testing only), FLCN, GREM1 (promoter region deletion/duplication testing only), KIT, MEN1, MLH1, MSH2, MSH3, MSH6, MUTYH, NBN, NF1, NHTL1, PALB2, PDGFRA, PMS2, POLD1, POLE, PTEN, RAD50, RAD51C, RAD51D, RNF43, SDHB, SDHC, SDHD, SMAD4, SMARCA4. STK11, TP53, TSC1, TSC2, and VHL.  The following genes were evaluated for sequence changes only: SDHA and HOXB13 c.251G>A variant only. The report date is August 14, 2020.  °  °11/05/2020 Cancer Staging  ° Staging form: Colon and Rectum, AJCC 8th Edition °- Pathologic stage from 11/05/2020: Stage IVC (rpTX, pN0, pM1c) - Signed by Katragadda, Sreedhar, MD on 11/05/2020 ° °  °11/10/2020 -  Chemotherapy  ° Patient is on Treatment Plan :   COLORECTAL Pembrolizumab q21d  °   ° ° °CANCER STAGING: ° Cancer Staging  °Malignant neoplasm of colon (HCC) °Staging form: Colon and Rectum, AJCC 8th Edition °- Clinical stage from 04/27/2020: Stage IIIB (cT4a, cN1a, cM0) - Unsigned °-  Pathologic stage from 11/05/2020: Stage IVC (rpTX, pN0, pM1c) - Signed by Katragadda, Sreedhar, MD on 11/05/2020 ° ° °INTERVAL HISTORY:  °[name], a 76 y.o. female, returns for routine follow-up and consideration for next cycle of chemotherapy. Gabrielle [name] was last seen on 12/29/2021. ° °Due for cycle #19 of Keytruda today.  ° °Overall, she tells me she has been feeling pretty well. She denies fatigue.  ° °Overall, she feels ready for next cycle of chemo today.  ° °REVIEW OF SYSTEMS:  °Review of Systems  °Constitutional:  Negative for appetite change and fatigue.  °Neurological:  Positive for headaches.  °All other systems reviewed and are negative. ° °PAST MEDICAL/SURGICAL HISTORY:  °Past Medical History:  °Diagnosis Date  ° Arthritis   ° Cancer (HCC)   ° Cataract   ° OU  ° Colon cancer (HCC)   ° Family history of pancreatic cancer 08/21/2020  ° Family history of uterine cancer 08/21/2020  ° H/O cesarean section   ° Hx of tonsillectomy   ° Hyperlipidemia   ° Hypertension   ° Macular degeneration   ° Exu ARMD OU  ° Paroxysmal atrial fibrillation (HCC) 07/05/2020  ° °Past Surgical History:  °Procedure Laterality Date  ° BIOPSY  04/06/2020  ° Procedure: BIOPSY;  Surgeon: Rehman, Najeeb U, MD;  Location: [facility];  Service: Endoscopy;;  ° CARPAL TUNNEL RELEASE Right   ° CATARACT EXTRACTION W/PHACO Left 10/11/2019  ° Procedure: CATARACT EXTRACTION PHACO AND INTRAOCULAR LENS PLACEMENT (IOC);  Surgeon: Wrzosek, James, MD;  Location: [facility];  Service: Ophthalmology;  Laterality: Left;  CDE: 8.56  ° CATARACT EXTRACTION W/PHACO Right 10/25/2019  ° Procedure: CATARACT EXTRACTION PHACO AND INTRAOCULAR LENS PLACEMENT (IOC);  Surgeon: Wrzosek, James, MD;  Location: [facility];  Service: Ophthalmology;  Laterality: Right;  CDE: 5.67  ° CESAREAN SECTION    ° COLONOSCOPY N/A 04/06/2020  ° Procedure: COLONOSCOPY;  Surgeon: Rehman, Najeeb U, MD;  [facility]: [facility];  Service: Endoscopy;  Laterality: N/A;  ° CYSTOSCOPY WITH BIOPSY  N/A 04/08/2020  ° Procedure: CYSTOSCOPY WITH BIOPSY;  Surgeon: McKenzie, Patrick L, MD;  Location: [facility];  Service: Urology;  Laterality: N/A;  ° ESOPHAGOGASTRODUODENOSCOPY N/A 04/05/2020  ° Procedure: ESOPHAGOGASTRODUODENOSCOPY (EGD);  Surgeon: Rehman, Najeeb U, MD;  [facility];  Service: Endoscopy;  Laterality: N/A;  ° PARTIAL COLECTOMY N/A 04/08/2020  ° Procedure: PARTIAL COLECTOMY;  Surgeon: Bridges, Lindsay C, MD;  Location: [facility];  Service: General;  Laterality: N/A;  ° PORTACATH PLACEMENT Left 05/18/2020  ° Procedure: INSERTION PORT-A-CATH (ATTACHED CATHETER IN LEFT SUBCLAVIAN);  Surgeon: Bridges, Lindsay C, MD;  Location: [facility];  Service: General;  Laterality: Left;  ° TONSILLECTOMY    ° ° °SOCIAL HISTORY:  °Social History  ° °Socioeconomic History  ° Marital status: Divorced  °  Spouse name: Not on file  ° Number of children: Not on file  ° Years of education: Not on file  ° Highest education level: Not on file  °Occupational History  ° Not on file  °Tobacco Use  ° Smoking status: Never  ° Smokeless tobacco: Never  °Vaping Use  ° Vaping Use: Never used  °Substance and Sexual Activity  ° Alcohol use: No  ° Drug use: No  °   Sexual activity: Not Currently  Other Topics Concern   Not on file  Social History Narrative   Not on file   Social Determinants of Health   Financial Resource Strain: Not on file  Food Insecurity: Not on file  Transportation Needs: Not on file  Physical Activity: Not on file  Stress: Not on file  Social Connections: Not on file  Intimate Partner Violence: Not on file    FAMILY HISTORY:  Family History  Problem Relation Age of Onset   Macular degeneration Mother    Stroke Mother    Hypertension Mother    Dementia Mother    Lung cancer Father        dx late 38s; smoking hx   Diabetes Sister    Hypertension Sister    Leukemia Paternal Uncle        d. 70s   Cancer Maternal Aunt        ovarian or endometrial dx 20s   Pancreatic cancer Paternal Uncle         d. late 28s    CURRENT MEDICATIONS:  Current Outpatient Medications  Medication Sig Dispense Refill   alendronate (FOSAMAX) 70 MG tablet Take 1 tablet (70 mg total) by mouth every 7 (seven) days. Take with a full glass of water on an empty stomach. 12 tablet 3   amLODipine (NORVASC) 5 MG tablet Take 1 tablet (5 mg total) by mouth daily. 90 tablet 3   atorvastatin (LIPITOR) 20 MG tablet Take 1 tablet (20 mg total) by mouth daily. 90 tablet 3   benzonatate (TESSALON PERLES) 100 MG capsule Take 1 capsule (100 mg total) by mouth 3 (three) times daily as needed. 20 capsule 0   cholecalciferol (VITAMIN D3) 25 MCG (1000 UT) tablet Take 1,000 Units by mouth daily.     CRANBERRY FRUIT PO Take 1 tablet by mouth daily.     diclofenac Sodium (VOLTAREN) 1 % GEL APPLY 4 GRAMS TO AFFECTED AREA(S) 4 TIMES A DAY 400 g 2   dorzolamide-timolol (COSOPT) 22.3-6.8 MG/ML ophthalmic solution INSTILL ONE DROP IN EACH EYE TWICE DAILY 10 mL 3   ELIQUIS 5 MG TABS tablet Take 1 tablet (5 mg total) by mouth 2 (two) times daily. 60 tablet 6   FEROSUL 325 (65 Fe) MG tablet TAKE  (1)  TABLET TWICE A DAY WITH MEALS (BREAKFAST AND SUPPER) 470 tablet 0   Garlic 962 MG TABS Take by mouth.     lidocaine-prilocaine (EMLA) cream Apply a small amount to port a cath site and cover with plastic wrap 1 hour prior to chemotherapy appointments 30 g 0   losartan-hydrochlorothiazide (HYZAAR) 100-25 MG tablet Take 1 tablet by mouth daily. 90 tablet 3   potassium chloride SA (KLOR-CON) 20 MEQ tablet Take 1 tablet (20 mEq total) by mouth 2 (two) times daily. 180 tablet 1   prochlorperazine (COMPAZINE) 10 MG tablet Take 1 tablet (10 mg total) by mouth every 6 (six) hours as needed for nausea or vomiting. 30 tablet 0   traMADol (ULTRAM) 50 MG tablet Take 1 tablet (50 mg total) by mouth 2 (two) times daily as needed. 30 tablet 0   No current facility-administered medications for this visit.    ALLERGIES:  Allergies  Allergen Reactions    Lisinopril Cough    PHYSICAL EXAM:  Performance status (ECOG): 1 - Symptomatic but completely ambulatory  Vitals:   02/09/22 1309  BP: (!) 175/74  Pulse: 69  Resp: 18  Temp: 98 F (36.7 C)  SpO2: 97%  ° °Wt Readings from Last 3 Encounters:  °02/09/22 174 lb 9.6 oz (79.2 kg)  °01/19/22 172 lb 3.2 oz (78.1 kg)  °12/08/21 170 lb 3.2 oz (77.2 kg)  ° °Physical Exam °Vitals reviewed.  °Constitutional:   °   Appearance: Normal appearance.  °Cardiovascular:  °   Rate and Rhythm: Normal rate and regular rhythm.  °   Pulses: Normal pulses.  °   Heart sounds: Normal heart sounds.  °Pulmonary:  °   Effort: Pulmonary effort is normal.  °   Breath sounds: Normal breath sounds.  °Abdominal:  °   Palpations: Abdomen is soft. There is no mass.  °   Tenderness: There is no abdominal tenderness.  °Musculoskeletal:  °   Right lower leg: No edema.  °   Left lower leg: No edema.  °Neurological:  °   General: No focal deficit present.  °   Mental Status: She is alert and oriented to person, place, and time.  °Psychiatric:     °   Mood and Affect: Mood normal.     °   Behavior: Behavior normal.  ° ° °LABORATORY DATA:  °I have reviewed the labs as listed.  °CBC Latest Ref Rng & Units 02/09/2022 01/19/2022 12/29/2021  °WBC 4.0 - 10.5 K/uL 6.2 6.5 6.6  °Hemoglobin 12.0 - 15.0 g/dL 13.3 13.0 13.5  °Hematocrit 36.0 - 46.0 % 40.0 38.2 40.6  °Platelets 150 - 400 K/uL 150 147(L) 153  ° °CMP Latest Ref Rng & Units 02/09/2022 01/19/2022 12/29/2021  °Glucose 70 - 99 mg/dL 99 101(H) 98  °BUN 8 - 23 mg/dL 19 18 20  °Creatinine 0.44 - 1.00 mg/dL 1.06(H) 1.04(H) 1.08(H)  °Sodium 135 - 145 mmol/L 133(L) 136 136  °Potassium 3.5 - 5.1 mmol/L 3.4(L) 3.3(L) 3.4(L)  °Chloride 98 - 111 mmol/L 94(L) 97(L) 96(L)  °CO2 22 - 32 mmol/L 30 26 32  °Calcium 8.9 - 10.3 mg/dL 10.2 9.8 9.8  °Total Protein 6.5 - 8.1 g/dL 8.2(H) 7.5 8.1  °Total Bilirubin 0.3 - 1.2 mg/dL 0.3 0.5 0.2(L)  °Alkaline Phos 38 - 126 U/L 75 79 68  °AST 15 - 41 U/L 35 22 23  °ALT 0 - 44 U/L 36  17 17  ° ° °DIAGNOSTIC IMAGING:  °I have independently reviewed the scans and discussed with the patient. °CT CHEST ABDOMEN PELVIS W CONTRAST ° °Result Date: 02/08/2022 °CLINICAL DATA:  History of ascending colon cancer status post partial colectomy with metastases. Ongoing chemotherapy. * onc * EXAM: CT CHEST, ABDOMEN, AND PELVIS WITH CONTRAST TECHNIQUE: Multidetector CT imaging of the chest, abdomen and pelvis was performed following the standard protocol during bolus administration of intravenous contrast. RADIATION DOSE REDUCTION: This exam was performed according to the departmental dose-optimization program which includes automated exposure control, adjustment of the mA and/or kV according to patient size and/or use of iterative reconstruction technique. CONTRAST:  100mL OMNIPAQUE IOHEXOL 300 MG/ML  SOLN COMPARISON:  Multiple priors including most recent CT September 28, 2021 FINDINGS: CT CHEST FINDINGS Cardiovascular: Left chest Port-A-Cath with tip in the innominate vein just proximal to the confluence with the SVC. Aortic atherosclerosis without aneurysmal dilation. No central pulmonary embolus on this nondedicated study. Mild biatrial cardiac enlargement. Coronary artery calcifications. No significant pericardial effusion/thickening. Mediastinum/Nodes: No supraclavicular adenopathy. No discrete thyroid nodule. No pathologically enlarged mediastinal, hilar or axillary lymph nodes. The trachea and esophagus are grossly unremarkable. Lungs/Pleura: Index pulmonary nodules are as follows: -Unchanged size of the 4 mm right   middle lobe pulmonary nodule on image 88/3. -Unchanged size of the right perihilar pulmonary nodule on image 91/3. -Unchanged size of the left lower lobe pulmonary nodule measuring 6 mm on image 88/3. -Unchanged size of the right lower lobe pulmonary nodule measuring 6 mm on image 81/3. Additional tiny scattered nodules are stable. No new suspicious pulmonary nodules or masses. No focal airspace  consolidation. Mild diffuse bronchial wall thickening. No pleural effusion. No pneumothorax. Musculoskeletal: Thoracolumbar spondylosis. No aggressive lytic or blastic lesion of bone. CT ABDOMEN PELVIS FINDINGS Hepatobiliary: Hepatic steatosis. No suspicious hepatic lesion. Gallbladder is unremarkable. No biliary ductal dilation. Pancreas: No pancreatic ductal dilation or evidence of acute inflammation. Spleen: No splenomegaly. Stable benign 16 mm splenic hypodensity likely a cyst/pseudocyst or lymphangioma. Adrenals/Urinary Tract: Bilateral adrenal glands appear normal. No hydronephrosis. Subcentimeter hypodense bilateral renal lesions are stable in the interval and while technically too small to accurately characterize likely reflect cysts. Similar circumferential bladder wall thickening. Stomach/Bowel: Radiopaque enteric contrast material traverses the descending colon. Postsurgical changes of right hemicolectomy are again noted. Stomach is nondistended limiting evaluation. No pathologic dilation of small or large bowel. Moderate volume of formed stool throughout the colon suggestive of constipation. Vascular/Lymphatic: Aortic atherosclerosis without abdominal aortic aneurysm. Prominent lymph nodes along the hepatic duodenal ligament are stable. No pathologically enlarged abdominal or pelvic lymph nodes. Reproductive: Uterus and bilateral adnexa are unremarkable. Other: Triangular soft tissue nodularity/stranding anterior to Gerota's fascia on the right near the ileocolic anastomosis is unchanged in size measuring 2 cm. No new discrete peritoneal or omental nodularity. No significant abdominopelvic free fluid. Musculoskeletal: Multilevel degenerative changes spine. No aggressive lytic or blastic lesion of bone. IMPRESSION: 1. Stable examination without new or progressive findings to suggest recurrence or active metastatic disease in the chest abdomen or pelvis. 2. Soft tissue nodularity adjacent to the ileocecal  anastomosis is not significantly changed from most recent prior but slowly decreasing in size over multiple priors. 3. Scattered tiny pulmonary nodules are stable in the interval. Continued attention on follow-up imaging recommended. 4. Similar circumferential bladder wall thickening, unchanged from prior. 5. Moderate volume of formed stool throughout the colon suggestive of constipation. 6.  Aortic Atherosclerosis (ICD10-I70.0). Electronically Signed   By: Dahlia Bailiff M.D.   On: 02/08/2022 10:58   Intravitreal Injection, Pharmacologic Agent - OS - Left Eye  Result Date: 01/20/2022 Time Out 01/20/2022. 8:37 AM. Confirmed correct patient, procedure, site, and patient consented. Anesthesia Topical anesthesia was used. Anesthetic medications included Lidocaine 2%, Proparacaine 0.5%. Procedure Preparation included 5% betadine to ocular surface, eyelid speculum. A (32g) needle was used. Injection: 2 mg aflibercept 2 MG/0.05ML   Route: Intravitreal, Site: Left Eye   NDC: [id number], Lot: [id number], Expiration date: 11/04/2022, Waste: 0.05 mL Post-op Post injection exam found visual acuity of at least counting fingers. The patient tolerated the procedure well. There were no complications. The patient received written and verbal post procedure care education. Post injection medications were not given.   OCT, Retina - OU - Both Eyes  Result Date: 01/20/2022 Right Eye Quality was good. Central Foveal Thickness: 540. Progression has been stable. Findings include pigment epithelial detachment, epiretinal membrane, subretinal hyper-reflective material, retinal drusen , abnormal foveal contour, no SRF, intraretinal fluid, disciform scar, outer retinal tubulation, outer retinal atrophy (Stable sub-retinal scar, mild persistent cystic changes overlying). Left Eye Quality was good. Central Foveal Thickness: 313. Progression has been stable. Findings include subretinal hyper-reflective material, pigment epithelial  detachment, intraretinal fluid, retinal drusen , outer retinal atrophy, no  SRF, abnormal foveal contour (persistent cystic changes overlying PED/SRHM -- ?mild improvement in cystic changes). Notes *Images captured and stored on drive Diagnosis / Impression: Exudative ARMD OU OD: Stable sub-retinal scar, mild persistent cystic changes overlying OS: persistent cystic changes overlying PED/SRHM -- ?mild improvement in cystic changes Clinical management: See below Abbreviations: NFP - Normal foveal profile. CME - cystoid macular edema. PED - pigment epithelial detachment. IRF - intraretinal fluid. SRF - subretinal fluid. EZ - ellipsoid zone. ERM - epiretinal membrane. ORA - outer retinal atrophy. ORT - outer retinal tubulation. SRHM - subretinal hyper-reflective material     ASSESSMENT:  1.  Stage IIIb (T4AN1A) poorly differentiated right colon adenocarcinoma: -Right hemicolectomy on 04/07/2020 with poorly differentiated adenocarcinoma, pT4a, positive radial margin, 1/15 lymph nodes positive, loss of MLH1 and PMS2, bladder biopsy negative for malignancy. -CTAP on 04/06/2020 showed circumferential ascending colon mass with numerous borderline enlarged pericolonic lymph nodes.  No findings of hepatic metastatic disease. -CEA on 04/04/2020 was 12.2.  CEA improved to 1.7 on 05/13/2020. -PET scan on 05/11/2020 shows mild FDG uptake associated with the anastomotic site.  Small pulmonary nodules that do not show elevated FDG activity, remain nonspecific, subcentimeter.  Persistent but improved bladder wall thickening. -Cycle 1 of dose reduced FOLFOX on 05/19/2020, followed by 2 hospitalizations, 1 from acute kidney injury from diarrhea and second admission for C. difficile colitis. -Cycle 2 of 5-FU and leucovorin dose reduced on 06/30/2020.  Chemotherapy discontinued secondary to intolerance. -Follow-up CT scan on 11/02/2020 with multiple soft tissue lesions in the central and right mesentery measuring up to 3.1 x 1.7 cm.  Mild  lymphadenopathy in the hepatic duodenal ligament, retroperitoneal space, right common iliac chain, bilateral external iliac chains.  17 mm soft tissue nodule in the lower anterior abdominal wall.  Bilateral tiny pulmonary nodules not substantially changed.  2.9 x 2.5 cm low-density lesion along the posterior uterus is indeterminate. -Pembrolizumab started on 11/10/2020. -CT CAP from 02/09/2021 showed interval near complete resolution of previously demonstrated nodal mass in the ileocolonic mesentery.  Residual ill-defined nodule medial to the tip of the right hepatic lobe.  Stable small pulmonary nodules bilaterally consistent with benign findings.   2.  Family history: -Paternal uncle had colon cancer.  Maternal aunt had brain cancer and another maternal aunt had gynecological malignancy.  Father had lung cancer and was a smoker. - Genetic testing did not reveal any notable mutations.   3.  Diffuse erythema and nodularity of the dome of the bladder: -CT scan showed severely thickened bladder wall. -Cystoscopy on 04/08/2020 showed diffuse erythema, nodularity in the posterior wall tracking to the dome.  Ureteral orifices were in normal locations.  Biopsies were benign.   PLAN:  1.  Stage IV right colon adenocarcinoma, MSI-high: -CT CAP on 02/08/2022: Stable exam without new or progressive findings to suggest recurrence or active metastatic disease in the chest, abdomen or pelvis.  Other minor findings were discussed with the patient. - She is tolerating immunotherapy reasonably well.  No immunotherapy related side effects. - Labs today shows TSH 2.15.  CBC was grossly normal.  LFTs are normal with mildly elevated total protein of 8.2. - CKD stable with creatinine around 1.06. - She will proceed with her treatment today and in 3 weeks.  RTC 6 weeks for follow-up.   2.  Paroxysmal atrial fibrillation: -Continue Eliquis daily.  No bleeding issues.   3.  Hypertension: -Continue Hyzaar and amlodipine.   Blood pressure is 175/74.  4.  Hypokalemia: -Continue  potassium 40 mEq daily.  Potassium today is 3.4.   Orders placed this encounter:  No orders of the defined types were placed in this encounter.    Derek Jack, MD [place] [phone]   I, Thana Ates, am acting as a scribe for Dr. Derek Jack.  I, Derek Jack MD, have reviewed the above documentation for accuracy and completeness, and I agree with the above.

## 2022-02-09 NOTE — Progress Notes (Signed)
Patient presents today for [facility], patient okay for treatment today per Dr. Delton Coombes. Patient tolerated therapy with no complaints voiced. Side effects with management reviewed with understanding verbalized. Port site clean and dry with no bruising or swelling noted at site. Good blood return noted before and after administration of therapy. Band aid applied. Patient left in satisfactory condition with VSS and no s/s of distress noted.  ?

## 2022-02-10 LAB — CEA: CEA: 2 ng/mL (ref 0.0–4.7)

## 2022-02-14 ENCOUNTER — Ambulatory Visit: Payer: Medicare HMO

## 2022-02-15 ENCOUNTER — Other Ambulatory Visit (HOSPITAL_COMMUNITY): Payer: Self-pay | Admitting: Hematology

## 2022-02-21 ENCOUNTER — Ambulatory Visit (INDEPENDENT_AMBULATORY_CARE_PROVIDER_SITE_OTHER): Payer: Medicare HMO

## 2022-02-21 VITALS — Ht 61.5 in | Wt 174.0 lb

## 2022-02-21 DIAGNOSIS — Z0000 Encounter for general adult medical examination without abnormal findings: Secondary | ICD-10-CM | POA: Diagnosis not present

## 2022-02-21 NOTE — Progress Notes (Signed)
? ?Subjective:  ? Gabrielle [name] is a 76 y.o. female who presents for an Initial Medicare Annual Wellness Visit. ?Virtual Visit via Telephone Note ? ?I connected with  Gabrielle [name] on 02/21/22 at 11:15 AM EDT by telephone and verified that I am speaking with the correct person using two identifiers. ? ?Location: ?Patient: HOME ?Provider: [facility] ?Persons participating in the virtual visit: patient/Nurse Health Advisor ?  ?I discussed the limitations, risks, security and privacy concerns of performing an evaluation and management service by telephone and the availability of in person appointments. The patient expressed understanding and agreed to proceed. ? ?Interactive audio and video telecommunications were attempted between this nurse and patient, however failed, due to patient having technical difficulties OR patient did not have access to video capability.  We continued and completed visit with audio only. ? ?Some vital signs may be absent or patient reported.  ? ?Chriss Driver, LPN ? ?Review of Systems    ? ? ?Cardiac Risk Factors include: advanced age (>35mn, >>16women);hypertension;sedentary lifestyle;obesity (BMI >30kg/m2);Other (see comment), Risk factor comments: MACULAR DEGENERATION, COLON CA. ? ?   ?Objective:  ?  ?Today's Vitals  ? 02/21/22 1114 02/21/22 1115  ?Weight: 174 lb (78.9 kg)   ?Height: 5' 1.5" (1.562 m)   ?PainSc:  0-No pain  ? ?Body mass index is 32.34 kg/m?. ? ?Advanced Directives 02/21/2022 02/09/2022 12/29/2021 12/29/2021 12/08/2021 11/16/2021 10/04/2021  ?Does Patient Have a Medical Advance Directive? No No No No No No No  ?Type of Advance Directive - - Living will;Healthcare Power of Attorney - Living will;Healthcare Power of Attorney - -  ?Does patient want to make changes to medical advance directive? No - Patient declined No - Patient declined - No - Patient declined No - Patient declined No - Patient declined No - Patient declined  ?Copy of HEthanin Chart? -  - No - copy requested - No - copy requested - -  ?Would patient like information on creating a medical advance directive? No - Patient declined No - Patient declined No - Patient declined No - Patient declined No - Patient declined No - Patient declined No - Patient declined  ? ? ?Current Medications (verified) ?Outpatient Encounter Medications as of 02/21/2022  ?Medication Sig  ? alendronate (FOSAMAX) 70 MG tablet Take 1 tablet (70 mg total) by mouth every 7 (seven) days. Take with a full glass of water on an empty stomach.  ? amLODipine (NORVASC) 5 MG tablet Take 1 tablet (5 mg total) by mouth daily.  ? atorvastatin (LIPITOR) 20 MG tablet Take 1 tablet (20 mg total) by mouth daily.  ? benzonatate (TESSALON PERLES) 100 MG capsule Take 1 capsule (100 mg total) by mouth 3 (three) times daily as needed.  ? cholecalciferol (VITAMIN D3) 25 MCG (1000 UT) tablet Take 1,000 Units by mouth daily.  ? CRANBERRY FRUIT PO Take 1 tablet by mouth daily.  ? diclofenac Sodium (VOLTAREN) 1 % GEL APPLY 4 GRAMS TO AFFECTED AREA(S) 4 TIMES A DAY  ? dorzolamide-timolol (COSOPT) 22.3-6.8 MG/ML ophthalmic solution INSTILL ONE DROP IN [facility] TWICE DAILY  ? ELIQUIS 5 MG TABS tablet Take 1 tablet (5 mg total) by mouth 2 (two) times daily.  ? FEROSUL 325 (65 Fe) MG tablet TAKE  (1)  TABLET TWICE A DAY WITH MEALS (BREAKFAST AND SUPPER)  ? Garlic 1735MG TABS Take by mouth.  ? lidocaine-prilocaine (EMLA) cream Apply a small amount to port a cath site  and cover with plastic wrap 1 hour prior to chemotherapy appointments  ? losartan-hydrochlorothiazide (HYZAAR) 100-25 MG tablet Take 1 tablet by mouth daily.  ? potassium chloride SA (KLOR-CON M) 20 MEQ tablet TAKE ONE TABLET TWICE DAILY  ? prochlorperazine (COMPAZINE) 10 MG tablet Take 1 tablet (10 mg total) by mouth every 6 (six) hours as needed for nausea or vomiting.  ? traMADol (ULTRAM) 50 MG tablet Take 1 tablet (50 mg total) by mouth 2 (two) times daily as needed.  ? ?No  facility-administered encounter medications on file as of 02/21/2022.  ? ? ?Allergies (verified) ?Lisinopril  ? ?History: ?Past Medical History:  ?Diagnosis Date  ? Arthritis   ? Cancer [facility])   ? Cataract   ? OU  ? Colon cancer ([place])   ? Family history of pancreatic cancer 08/21/2020  ? Family history of uterine cancer 08/21/2020  ? H/O cesarean section   ? Hx of tonsillectomy   ? Hyperlipidemia   ? Hypertension   ? Macular degeneration   ? Exu ARMD OU  ? Paroxysmal atrial fibrillation ([place]) 07/05/2020  ? ?Past Surgical History:  ?Procedure Laterality Date  ? BIOPSY  04/06/2020  ? Procedure: BIOPSY;  Surgeon: Rogene Houston, MD;  [facility];  Service: Endoscopy;;  ? CARPAL TUNNEL RELEASE Right   ? CATARACT EXTRACTION W/PHACO Left 10/11/2019  ? Procedure: CATARACT EXTRACTION PHACO AND INTRAOCULAR LENS PLACEMENT (IOC);  Surgeon: Baruch Goldmann, MD;  Location: [facility];  Service: Ophthalmology;  Laterality: Left;  CDE: 8.56  ? CATARACT EXTRACTION W/PHACO Right 10/25/2019  ? Procedure: CATARACT EXTRACTION PHACO AND INTRAOCULAR LENS PLACEMENT (IOC);  Surgeon: Baruch Goldmann, MD;  Location: [facility];  Service: Ophthalmology;  Laterality: Right;  CDE: 5.67  ? CESAREAN SECTION    ? COLONOSCOPY N/A 04/06/2020  ? Procedure: COLONOSCOPY;  Surgeon: Rogene Houston, MD;  [facility];  Service: Endoscopy;  Laterality: N/A;  ? CYSTOSCOPY WITH BIOPSY N/A 04/08/2020  ? Procedure: CYSTOSCOPY WITH BIOPSY;  Surgeon: Cleon Gustin, MD;  Location: [facility];  Service: Urology;  Laterality: N/A;  ? ESOPHAGOGASTRODUODENOSCOPY N/A 04/05/2020  ? Procedure: ESOPHAGOGASTRODUODENOSCOPY (EGD);  Surgeon: Rogene Houston, MD;  [facility];  Service: Endoscopy;  Laterality: N/A;  ? PARTIAL COLECTOMY N/A 04/08/2020  ? Procedure: PARTIAL COLECTOMY;  Surgeon: Virl Cagey, MD;  Location: [facility];  Service: General;  Laterality: N/A;  ? PORTACATH PLACEMENT Left 05/18/2020  ? Procedure: INSERTION PORT-A-CATH (ATTACHED  CATHETER IN LEFT SUBCLAVIAN);  Surgeon: Virl Cagey, MD;  Location: [facility];  Service: General;  Laterality: Left;  ? TONSILLECTOMY    ? ?Family History  ?Problem Relation Age of Onset  ? Macular degeneration Mother   ? Stroke Mother   ? Hypertension Mother   ? Dementia Mother   ? Lung cancer Father   ?     dx late 60s; smoking hx  ? Diabetes Sister   ? Hypertension Sister   ? Leukemia Paternal Uncle   ?     d. 51s  ? Cancer Maternal Aunt   ?     ovarian or endometrial dx 32s  ? Pancreatic cancer Paternal Uncle   ?     d. late 35s  ? ?Social History  ? ?Socioeconomic History  ? Marital status: Divorced  ?  Spouse name: Not on file  ? Number of children: Not on file  ? Years of education: Not on file  ? Highest education level: Not on  file  ?Occupational History  ? Not on file  ?Tobacco Use  ? Smoking status: Never  ? Smokeless tobacco: Never  ?Vaping Use  ? Vaping Use: Never used  ?Substance and Sexual Activity  ? Alcohol use: No  ? Drug use: No  ? Sexual activity: Not Currently  ?Other Topics Concern  ? Not on file  ?Social History Narrative  ? Not on file  ? ?Social Determinants of Health  ? ?Financial Resource Strain: Low Risk   ? Difficulty of Paying Living Expenses: Not hard at all  ?Food Insecurity: No Food Insecurity  ? Worried About Charity fundraiser in the Last Year: Never true  ? Ran Out of Food in the Last Year: Never true  ?Transportation Needs: No Transportation Needs  ? Lack of Transportation (Medical): No  ? Lack of Transportation (Non-Medical): No  ?Physical Activity: Sufficiently Active  ? Days of Exercise per Week: 5 days  ? Minutes of Exercise per Session: 30 min  ?Stress: No Stress Concern Present  ? Feeling of Stress : Not at all  ?Social Connections: Moderately Isolated  ? Frequency of Communication with Friends and Family: More than three times a week  ? Frequency of Social Gatherings with Friends and Family: More than three times a week  ? Attends Religious Services: 1 to 4 times per  year  ? Active Member of Clubs or Organizations: No  ? Attends Archivist Meetings: Never  ? Marital Status: Divorced  ? ? ?Tobacco Counseling ?Counseling given: Not Answered ? ? ?Clinical Intake: ? ?Pre-visit prepa

## 2022-02-21 NOTE — Patient Instructions (Signed)
Gabrielle [name] , ?Thank you for taking time to come for your Medicare Wellness Visit. I appreciate your ongoing commitment to your health goals. Please review the following plan we discussed and let me know if I can assist you in the future.  ? ?Screening recommendations/referrals: ?Colonoscopy: Done 04/06/2020 Repeat in 10 years ? ?Mammogram: Done 02/26/2018 Repeat annually. Check with Oncologist regarding port and ability to do Mammogram. ? ?Bone Density: Done 07/22/2021 Repeat every 2 years ? ?Recommended yearly ophthalmology/optometry visit for glaucoma screening and checkup ?Recommended yearly dental visit for hygiene and checkup ? ?Vaccinations: ?Influenza vaccine: Done 11/16/2021 Repeat annually ? ?Pneumococcal vaccine: Done 01/25/2018, 06/24/2019.  ?Tdap vaccine: Done 04/12/2010 Repeat in 10 years ? ?Shingles vaccine: Done 03/20/2020 and 11/19/2019.   ?Covid-19:Done 02/11/2020, 10/06/2020 ? ?Advanced directives: Advance directive discussed with you today. Even though you declined this today, please call our office should you change your mind, and we can give you the proper paperwork for you to fill out. ? ? ?Conditions/risks identified: Aim for 30 minutes of exercise or brisk walking, 6-8 glasses of water, and 5 servings of fruits and vegetables each day. ?KEEP UP THE GOOD WORK!! ? ?Next appointment: Follow up in one year for your annual wellness visit 2024. ? ? ?Preventive Care 53 Years and Older, Female ?Preventive care refers to lifestyle choices and visits with your health care provider that can promote health and wellness. ?What does preventive care include? ?A yearly physical exam. This is also called an annual well check. ?Dental exams once or twice a year. ?Routine eye exams. Ask your health care provider how often you should have your eyes checked. ?Personal lifestyle choices, including: ?Daily care of your teeth and gums. ?Regular physical activity. ?Eating a healthy diet. ?Avoiding tobacco and drug use. ?Limiting  alcohol use. ?Practicing safe sex. ?Taking low-dose aspirin every day. ?Taking vitamin and mineral supplements as recommended by your health care provider. ?What happens during an annual well check? ?The services and screenings done by your health care provider during your annual well check will depend on your age, overall health, lifestyle risk factors, and family history of disease. ?Counseling  ?Your health care provider may ask you questions about your: ?Alcohol use. ?Tobacco use. ?Drug use. ?Emotional well-being. ?Home and relationship well-being. ?Sexual activity. ?Eating habits. ?History of falls. ?Memory and ability to understand (cognition). ?Work and work Statistician. ?Reproductive health. ?Screening  ?You may have the following tests or measurements: ?Height, weight, and BMI. ?Blood pressure. ?Lipid and cholesterol levels. These may be checked every 5 years, or more frequently if you are over 62 years old. ?Skin check. ?Lung cancer screening. You may have this screening every year starting at age 97 if you have a 30-pack-year history of smoking and currently smoke or have quit within the past 15 years. ?Fecal occult blood test (FOBT) of the stool. You may have this test every year starting at age 63. ?Flexible sigmoidoscopy or colonoscopy. You may have a sigmoidoscopy every 5 years or a colonoscopy every 10 years starting at age 79. ?Hepatitis C blood test. ?Hepatitis B blood test. ?Sexually transmitted disease (STD) testing. ?Diabetes screening. This is done by checking your blood sugar (glucose) after you have not eaten for a while (fasting). You may have this done every 1-3 years. ?Bone density scan. This is done to screen for osteoporosis. You may have this done starting at age 92. ?Mammogram. This may be done every 1-2 years. Talk to your health care provider about how often you  should have regular mammograms. ?Talk with your health care provider about your test results, treatment options, and if  necessary, the need for more tests. ?Vaccines  ?Your health care provider may recommend certain vaccines, such as: ?Influenza vaccine. This is recommended every year. ?Tetanus, diphtheria, and acellular pertussis (Tdap, Td) vaccine. You may need a Td booster every 10 years. ?Zoster vaccine. You may need this after age 46. ?Pneumococcal 13-valent conjugate (PCV13) vaccine. One dose is recommended after age 7. ?Pneumococcal polysaccharide (PPSV23) vaccine. One dose is recommended after age 62. ?Talk to your health care provider about which screenings and vaccines you need and how often you need them. ?This information is not intended to replace advice given to you by your health care provider. Make sure you discuss any questions you have with your health care provider. ?Document Released: 12/18/2015 Document Revised: 08/10/2016 Document Reviewed: 09/22/2015 ?Elsevier Interactive Patient Education ? 2017 [place]. ? ?Fall Prevention in the Home ?Falls can cause injuries. They can happen to people of all ages. There are many things you can do to make your home safe and to help prevent falls. ?What can I do on the outside of my home? ?Regularly fix the edges of walkways and driveways and fix any cracks. ?Remove anything that might make you trip as you walk through a door, such as a raised step or threshold. ?Trim any bushes or trees on the path to your home. ?Use bright outdoor lighting. ?Clear any walking paths of anything that might make someone trip, such as rocks or tools. ?Regularly check to see if handrails are loose or broken. Make sure that both sides of any steps have handrails. ?Any raised decks and porches should have guardrails on the edges. ?Have any leaves, snow, or ice cleared regularly. ?Use sand or salt on walking paths during winter. ?Clean up any spills in your garage right away. This includes oil or grease spills. ?What can I do in the bathroom? ?Use night lights. ?Install grab bars by the toilet  and in the tub and shower. Do not use towel bars as grab bars. ?Use non-skid mats or decals in the tub or shower. ?If you need to sit down in the shower, use a plastic, non-slip stool. ?Keep the floor dry. Clean up any water that spills on the floor as soon as it happens. ?Remove soap buildup in the tub or shower regularly. ?Attach bath mats securely with double-sided non-slip rug tape. ?Do not have throw rugs and other things on the floor that can make you trip. ?What can I do in the bedroom? ?Use night lights. ?Make sure that you have a light by your bed that is easy to reach. ?Do not use any sheets or blankets that are too big for your bed. They should not hang down onto the floor. ?Have a firm chair that has side arms. You can use this for support while you get dressed. ?Do not have throw rugs and other things on the floor that can make you trip. ?What can I do in the kitchen? ?Clean up any spills right away. ?Avoid walking on wet floors. ?Keep items that you use a lot in easy-to-reach places. ?If you need to reach something above you, use a strong step stool that has a grab bar. ?Keep electrical cords out of the way. ?Do not use floor polish or wax that makes floors slippery. If you must use wax, use non-skid floor wax. ?Do not have throw rugs and other things on the  floor that can make you trip. ?What can I do with my stairs? ?Do not leave any items on the stairs. ?Make sure that there are handrails on both sides of the stairs and use them. Fix handrails that are broken or loose. Make sure that handrails are as long as the stairways. ?Check any carpeting to make sure that it is firmly attached to the stairs. Fix any carpet that is loose or worn. ?Avoid having throw rugs at the top or bottom of the stairs. If you do have throw rugs, attach them to the floor with carpet tape. ?Make sure that you have a light switch at the top of the stairs and the bottom of the stairs. If you do not have them, ask someone to add  them for you. ?What else can I do to help prevent falls? ?Wear shoes that: ?Do not have high heels. ?Have rubber bottoms. ?Are comfortable and fit you well. ?Are closed at the toe. Do not wear sandals

## 2022-02-28 NOTE — Progress Notes (Signed)
?[facility] Note ? ?03/03/2022 ?  ? ?CHIEF COMPLAINT ?Patient presents for Retina Follow Up ? ?HISTORY OF PRESENT ILLNESS: ?Gabrielle [name] is a 76 y.o. female who presents to the clinic today for:  ? ?HPI   ? ? Retina Follow Up   ?Patient presents with  Wet AMD.  In both eyes.  Duration of 6 weeks.  Since onset it is stable.  I, the attending physician,  performed the HPI with the patient and updated documentation appropriately. ? ?  ?  ? ? Comments   ?6 week follow up Exu ARMD OU-  Her allergies have been bad the past 2 weeks.  Eyes have been tearing.  It affects her vision at times.  She is unsure how the eye test will go today. Using Cosopt BID OU. ? ?  ?  ?Last edited by Bernarda Caffey, MD on 03/03/2022  9:09 AM.  ?  ?Pt states no change in vision, eye are watering from allergies ? ?Referring physician: ?Janora Norlander, DO ?[address] [place],  [place] [address] ? ?HISTORICAL INFORMATION:  ? ?Selected notes from the [facility] ?Referred by Dr. Cristela Blue for concern of ARMD OU  ? ?CURRENT MEDICATIONS: ?Current Outpatient Medications (Ophthalmic Drugs)  ?Medication Sig  ? dorzolamide-timolol (COSOPT) 22.3-6.8 MG/ML ophthalmic solution INSTILL ONE DROP IN [facility] EYE TWICE DAILY  ? ?No current facility-administered medications for this visit. (Ophthalmic Drugs)  ? ?Current Outpatient Medications (Other)  ?Medication Sig  ? alendronate (FOSAMAX) 70 MG tablet Take 1 tablet (70 mg total) by mouth every 7 (seven) days. Take with a full glass of water on an empty stomach.  ? amLODipine (NORVASC) 5 MG tablet Take 1 tablet (5 mg total) by mouth daily.  ? atorvastatin (LIPITOR) 20 MG tablet Take 1 tablet (20 mg total) by mouth daily.  ? benzonatate (TESSALON PERLES) 100 MG capsule Take 1 capsule (100 mg total) by mouth 3 (three) times daily as needed.  ? cholecalciferol (VITAMIN D3) 25 MCG (1000 UT) tablet Take 1,000 Units by mouth daily.  ? CRANBERRY FRUIT PO Take 1 tablet by mouth  daily.  ? diclofenac Sodium (VOLTAREN) 1 % GEL APPLY 4 GRAMS TO AFFECTED AREA(S) 4 TIMES A DAY  ? ELIQUIS 5 MG TABS tablet Take 1 tablet (5 mg total) by mouth 2 (two) times daily.  ? FEROSUL 325 (65 Fe) MG tablet TAKE  (1)  TABLET TWICE A DAY WITH MEALS (BREAKFAST AND SUPPER)  ? Garlic 967 MG TABS Take by mouth.  ? lidocaine-prilocaine (EMLA) cream Apply a small amount to port a cath site and cover with plastic wrap 1 hour prior to chemotherapy appointments  ? losartan-hydrochlorothiazide (HYZAAR) 100-25 MG tablet Take 1 tablet by mouth daily.  ? potassium chloride SA (KLOR-CON M) 20 MEQ tablet TAKE ONE TABLET TWICE DAILY  ? prochlorperazine (COMPAZINE) 10 MG tablet Take 1 tablet (10 mg total) by mouth every 6 (six) hours as needed for nausea or vomiting.  ? traMADol (ULTRAM) 50 MG tablet Take 1 tablet (50 mg total) by mouth 2 (two) times daily as needed.  ? ?No current facility-administered medications for this visit. (Other)  ? ?REVIEW OF SYSTEMS: ?ROS   ?Positive for: Gastrointestinal, Genitourinary, Musculoskeletal, Cardiovascular, Eyes ?Negative for: Constitutional, Neurological, Skin, HENT, Endocrine, Respiratory, Psychiatric, Allergic/Imm, Heme/Lymph ?Last edited by Leonie Douglas, COA on 03/03/2022  8:22 AM.  ?  ? ?ALLERGIES ?Allergies  ?Allergen Reactions  ? Lisinopril Cough  ? ?  PAST MEDICAL HISTORY ?Past Medical History:  ?Diagnosis Date  ? Arthritis   ? Cancer [facility])   ? Cataract   ? OU  ? Colon cancer ([place])   ? Family history of pancreatic cancer 08/21/2020  ? Family history of uterine cancer 08/21/2020  ? H/O cesarean section   ? Hx of tonsillectomy   ? Hyperlipidemia   ? Hypertension   ? Macular degeneration   ? Exu ARMD OU  ? Paroxysmal atrial fibrillation ([place]) 07/05/2020  ? ?Past Surgical History:  ?Procedure Laterality Date  ? BIOPSY  04/06/2020  ? Procedure: BIOPSY;  Surgeon: Rogene Houston, MD;  Location: [facility] ENDO SU[facility];  Service: Endoscopy;;  ? CARPAL TUNNEL RELEASE Right   ? CATARACT EXTRACTION  W/PHACO Left 10/11/2019  ? Procedure: CATARACT EXTRACTION PHACO AND INTRAOCULAR LENS PLACEMENT (IOC);  Surgeon: Baruch Goldmann, MD;  Location: [facility] ORS;  Service: Ophthalmology;  Laterality: Left;  CDE: 8.56  ? CATARACT EXTRACTION W/PHACO Right 10/25/2019  ? Procedure: CATARACT EXTRACTION PHACO AND INTRAOCULAR LENS PLACEMENT (IOC);  Surgeon: Baruch Goldmann, MD;  Location: [facility] ORS;  Service: Ophthalmology;  Laterality: Right;  CDE: 5.67  ? CESAREAN SECTION    ? COLONOSCOPY N/A 04/06/2020  ? Procedure: COLONOSCOPY;  Surgeon: Rogene Houston, MD;  Location: [facility] ENDO SUITE;  Service: Endoscopy;  Laterality: N/A;  ? CYSTOSCOPY WITH BIOPSY N/A 04/08/2020  ? Procedure: CYSTOSCOPY WITH BIOPSY;  Surgeon: Cleon Gustin, MD;  Location: [facility] ORS;  Service: Urology;  Laterality: N/A;  ? ESOPHAGOGASTRODUODENOSCOPY N/A 04/05/2020  ? Procedure: ESOPHAGOGASTRODUODENOSCOPY (EGD);  Surgeon: Rogene Houston, MD;  Location: [facility] ENDO SUITE;  Service: Endoscopy;  Laterality: N/A;  ? PARTIAL COLECTOMY N/A 04/08/2020  ? Procedure: PARTIAL COLECTOMY;  Surgeon: Virl Cagey, MD;  Location: [facility] ORS;  Service: General;  Laterality: N/A;  ? PORTACATH PLACEMENT Left 05/18/2020  ? Procedure: INSERTION PORT-A-CATH (ATTACHED CATHETER IN LEFT SUBCLAVIAN);  Surgeon: Virl Cagey, MD;  Location: [facility] ORS;  Service: General;  Laterality: Left;  ? TONSILLECTOMY    ? ?FAMILY HISTORY ?Family History  ?Problem Relation Age of Onset  ? Macular degeneration Mother   ? Stroke Mother   ? Hypertension Mother   ? Dementia Mother   ? Lung cancer Father   ?     dx late 22s; smoking hx  ? Diabetes Sister   ? Hypertension Sister   ? Leukemia Paternal Uncle   ?     d. 96s  ? Cancer Maternal Aunt   ?     ovarian or endometrial dx 37s  ? Pancreatic cancer Paternal Uncle   ?     d. late 7s  ? ?SOCIAL HISTORY ?Social History  ? ?Tobacco Use  ? Smoking status: Never  ? Smokeless tobacco: Never  ?Vaping Use  ? Vaping Use: Never used  ?Substance Use Topics  ? Alcohol use:  No  ? Drug use: No  ?  ? ?  ?OPHTHALMIC EXAM: ?Base Eye Exam   ? ? Visual Acuity (Snellen - Linear)   ? ?   Right Left  ? Dist cc CF 3' 20/80 +2  ? Dist ph cc  NI  ? ? Correction: Glasses  ?OS unable to see first 2 letters of each line ? ?  ?  ? ? Tonometry (Tonopen, 8:29 AM)   ? ?   Right Left  ? Pressure 23 20  ? ?  ?  ? ? Pupils   ? ?   Dark Light Shape  React APD  ? Right 4 3 Round Brisk None  ? Left 4 3 Round Brisk None  ? ?  ?  ? ? Visual Fields (Counting fingers)   ? ?   Left Right  ?  Full Full  ? ?  ?  ? ? Extraocular Movement   ? ?   Right Left  ?  Full Full  ? ?  ?  ? ? Neuro/Psych   ? ? Oriented x3: Yes  ? Mood/Affect: Normal  ? ?  ?  ? ? Dilation   ? ? Both eyes: 1.0% Mydriacyl, 2.5% Phenylephrine @ 8:29 AM  ? ?  ?  ? ?  ? ?Slit Lamp and Fundus Exam   ? ? Slit Lamp Exam   ? ?   Right Left  ? Lids/Lashes Dermatochalasis - upper lid Dermatochalasis - upper lid  ? Conjunctiva/Sclera 1+ Injection White and quiet  ? Cornea Arcus, 3+ fine Punctpate epithelial erosions Arcus, 3+ fine Punctate epithelial erosions, mild tear film debris  ? Anterior Chamber Deep and quiet Deep and quiet  ? Iris Round and well dilated Round and well dilated  ? Lens PCIOL in good position, 1+ Posterior capsular opacification PC IOL in good position with open PC  ? Anterior Vitreous Vitreous syneresis, Posterior vitreous detachment, vitreous condensations Vitreous syneresis, Posterior vitreous detachment, silicone oil micro drops  ? ?  ?  ? ? Fundus Exam   ? ?   Right Left  ? Disc pink and sharp, compact mild pallor, sharp, Compact, vascular loops superiorly  ? C/D Ratio 0.2 0.2  ? Macula Blunted foveal reflex, Drusen, Pigment clumping and atrophy, central thickening/pigmented disciform scar, +PED, no heme, persistent, trace cystic changes overlying central scar - stable Blunted foveal reflex, central thickening, RPE clumping and atrophy, Drusen, RPE rip, trace cystic changes overlying PED - persistent, no heme  ? Vessels Vascular  attenuation, Tortuous Vascular attenuation, Tortuous  ? Periphery Attached, mild Reticular degeneration Attached, mild Reticular degeneration  ? ?  ?  ? ?  ? ?Refraction   ? ? Wearing Rx   ? ?   Sphere Cylinder

## 2022-03-02 ENCOUNTER — Inpatient Hospital Stay (HOSPITAL_COMMUNITY): Payer: Medicare HMO

## 2022-03-02 VITALS — BP 168/66 | HR 63 | Temp 97.2°F | Resp 18 | Ht 61.5 in | Wt 172.2 lb

## 2022-03-02 DIAGNOSIS — Z5112 Encounter for antineoplastic immunotherapy: Secondary | ICD-10-CM | POA: Diagnosis not present

## 2022-03-02 DIAGNOSIS — C189 Malignant neoplasm of colon, unspecified: Secondary | ICD-10-CM

## 2022-03-02 DIAGNOSIS — C182 Malignant neoplasm of ascending colon: Secondary | ICD-10-CM | POA: Diagnosis not present

## 2022-03-02 DIAGNOSIS — Z79899 Other long term (current) drug therapy: Secondary | ICD-10-CM | POA: Diagnosis not present

## 2022-03-02 LAB — CBC WITH DIFFERENTIAL/PLATELET
Abs Immature Granulocytes: 0.02 10*3/uL (ref 0.00–0.07)
Basophils Absolute: 0 10*3/uL (ref 0.0–0.1)
Basophils Relative: 1 %
Eosinophils Absolute: 0.3 10*3/uL (ref 0.0–0.5)
Eosinophils Relative: 5 %
HCT: 41.1 % (ref 36.0–46.0)
Hemoglobin: 13.3 g/dL (ref 12.0–15.0)
Immature Granulocytes: 0 %
Lymphocytes Relative: 23 %
Lymphs Abs: 1.3 10*3/uL (ref 0.7–4.0)
MCH: 29.9 pg (ref 26.0–34.0)
MCHC: 32.4 g/dL (ref 30.0–36.0)
MCV: 92.4 fL (ref 80.0–100.0)
Monocytes Absolute: 0.5 10*3/uL (ref 0.1–1.0)
Monocytes Relative: 9 %
Neutro Abs: 3.6 10*3/uL (ref 1.7–7.7)
Neutrophils Relative %: 62 %
Platelets: 153 10*3/uL (ref 150–400)
RBC: 4.45 MIL/uL (ref 3.87–5.11)
RDW: 13.2 % (ref 11.5–15.5)
WBC: 5.7 10*3/uL (ref 4.0–10.5)
nRBC: 0 % (ref 0.0–0.2)

## 2022-03-02 LAB — COMPREHENSIVE METABOLIC PANEL
ALT: 30 U/L (ref 0–44)
AST: 31 U/L (ref 15–41)
Albumin: 4.4 g/dL (ref 3.5–5.0)
Alkaline Phosphatase: 71 U/L (ref 38–126)
Anion gap: 6 (ref 5–15)
BUN: 22 mg/dL (ref 8–23)
CO2: 30 mmol/L (ref 22–32)
Calcium: 9.5 mg/dL (ref 8.9–10.3)
Chloride: 100 mmol/L (ref 98–111)
Creatinine, Ser: 0.98 mg/dL (ref 0.44–1.00)
GFR, Estimated: >60 mL/min (ref >60)
Glucose, Bld: 101 mg/dL — ABNORMAL HIGH (ref 70–99)
Potassium: 3.1 mmol/L — ABNORMAL LOW (ref 3.5–5.1)
Sodium: 136 mmol/L (ref 135–145)
Total Bilirubin: 0.6 mg/dL (ref 0.3–1.2)
Total Protein: 8.2 g/dL — ABNORMAL HIGH (ref 6.5–8.1)

## 2022-03-02 LAB — TSH: TSH: 1.582 u[IU]/mL (ref 0.350–4.500)

## 2022-03-02 LAB — MAGNESIUM: Magnesium: 2.1 mg/dL (ref 1.7–2.4)

## 2022-03-02 MED ORDER — SODIUM CHLORIDE 0.9 % IV SOLN
200.0000 mg | Freq: Once | INTRAVENOUS | Status: AC
  Administered 2022-03-02: 200 mg via INTRAVENOUS
  Filled 2022-03-02: qty 8

## 2022-03-02 MED ORDER — HEPARIN SOD (PORK) LOCK FLUSH 100 UNIT/ML IV SOLN
500.0000 [IU] | Freq: Once | INTRAVENOUS | Status: AC | PRN
  Administered 2022-03-02: 500 [IU]

## 2022-03-02 MED ORDER — SODIUM CHLORIDE 0.9 % IV SOLN: Freq: Once | INTRAVENOUS | Status: AC

## 2022-03-02 MED ORDER — SODIUM CHLORIDE 0.9% FLUSH
10.0000 mL | INTRAVENOUS | Status: DC | PRN
  Administered 2022-03-02: 10 mL

## 2022-03-02 NOTE — Progress Notes (Signed)
Pt presents today for Keytruda per provider's order. Labs and vital signs WNL for treatment today.  ? ?Keytruda given today per MD orders. Tolerated infusion without adverse affects. Vital signs stable. No complaints at this time. Discharged from clinic ambulatory in stable condition. Alert and oriented x 3. F/U with [facility] as scheduled.   ?

## 2022-03-02 NOTE — Patient Instructions (Signed)
[facility]  Discharge Instructions: Thank you for choosing [facility] to provide your oncology and hematology care.  If you have a lab appointment with the Cancer Center, please come in thru the Main Entrance and check in at the main information desk.  Wear comfortable clothing and clothing appropriate for easy access to any Portacath or PICC line.   We strive to give you quality time with your provider. You may need to reschedule your appointment if you arrive late (15 or more minutes).  Arriving late affects you and other patients whose appointments are after yours.  Also, if you miss three or more appointments without notifying the office, you may be dismissed from the clinic at the provider's discretion.      For prescription refill requests, have your pharmacy contact our office and allow 72 hours for refills to be completed.    Today you received the following chemotherapy and/or immunotherapy agents Keytruda       To help prevent nausea and vomiting after your treatment, we encourage you to take your nausea medication as directed.  BELOW ARE SYMPTOMS THAT SHOULD BE REPORTED IMMEDIATELY: *FEVER GREATER THAN 100.4 F (38 C) OR HIGHER *CHILLS OR SWEATING *NAUSEA AND VOMITING THAT IS NOT CONTROLLED WITH YOUR NAUSEA MEDICATION *UNUSUAL SHORTNESS OF BREATH *UNUSUAL BRUISING OR BLEEDING *URINARY PROBLEMS (pain or burning when urinating, or frequent urination) *BOWEL PROBLEMS (unusual diarrhea, constipation, pain near the anus) TENDERNESS IN MOUTH AND THROAT WITH OR WITHOUT PRESENCE OF ULCERS (sore throat, sores in mouth, or a toothache) UNUSUAL RASH, SWELLING OR PAIN  UNUSUAL VAGINAL DISCHARGE OR ITCHING   Items with * indicate a potential emergency and should be followed up as soon as possible or go to the Emergency Department if any problems should occur.  Please show the CHEMOTHERAPY ALERT CARD or IMMUNOTHERAPY ALERT CARD at check-in to the Emergency  Department and triage nurse.  Should you have questions after your visit or need to cancel or reschedule your appointment, please contact [facility] [phone]  and follow the prompts.  Office hours are 8:00 a.m. to 4:30 p.m. Monday - Friday. Please note that voicemails left after 4:00 p.m. may not be returned until the following business day.  We are closed weekends and major holidays. You have access to a nurse at all times for urgent questions. Please call the main number to the clinic [phone] and follow the prompts.  For any non-urgent questions, you may also contact your provider using MyChart. We now offer e-Visits for anyone 18 and older to request care online for non-urgent symptoms. For details visit [email or web address].   Also download the MyChart app! Go to the app store, search "MyChart", open the app, select [facility], and log in with your MyChart username and password.  Due to Covid, a mask is required upon entering the hospital/clinic. If you do not have a mask, one will be given to you upon arrival. For doctor visits, patients may have 1 support person aged 18 or older with them. For treatment visits, patients cannot have anyone with them due to current Covid guidelines and our immunocompromised population.  

## 2022-03-03 ENCOUNTER — Ambulatory Visit (INDEPENDENT_AMBULATORY_CARE_PROVIDER_SITE_OTHER): Payer: Medicare HMO | Admitting: Ophthalmology

## 2022-03-03 ENCOUNTER — Encounter (INDEPENDENT_AMBULATORY_CARE_PROVIDER_SITE_OTHER): Payer: Self-pay | Admitting: Ophthalmology

## 2022-03-03 DIAGNOSIS — H353231 Exudative age-related macular degeneration, bilateral, with active choroidal neovascularization: Secondary | ICD-10-CM | POA: Diagnosis not present

## 2022-03-03 DIAGNOSIS — H40053 Ocular hypertension, bilateral: Secondary | ICD-10-CM | POA: Diagnosis not present

## 2022-03-03 DIAGNOSIS — Z961 Presence of intraocular lens: Secondary | ICD-10-CM | POA: Diagnosis not present

## 2022-03-03 DIAGNOSIS — H43813 Vitreous degeneration, bilateral: Secondary | ICD-10-CM

## 2022-03-03 DIAGNOSIS — H04123 Dry eye syndrome of bilateral lacrimal glands: Secondary | ICD-10-CM | POA: Diagnosis not present

## 2022-03-03 MED ORDER — AFLIBERCEPT 2MG/0.05ML IZ SOLN FOR KALEIDOSCOPE
2.0000 mg | INTRAVITREAL | Status: AC | PRN
  Administered 2022-03-03: 2 mg via INTRAVITREAL

## 2022-03-23 ENCOUNTER — Inpatient Hospital Stay (HOSPITAL_COMMUNITY): Payer: Medicare HMO

## 2022-03-23 ENCOUNTER — Inpatient Hospital Stay (HOSPITAL_BASED_OUTPATIENT_CLINIC_OR_DEPARTMENT_OTHER): Payer: Medicare HMO | Admitting: Hematology

## 2022-03-23 ENCOUNTER — Inpatient Hospital Stay (HOSPITAL_COMMUNITY): Payer: Medicare HMO | Attending: Hematology

## 2022-03-23 ENCOUNTER — Other Ambulatory Visit (HOSPITAL_COMMUNITY): Payer: Self-pay | Admitting: *Deleted

## 2022-03-23 ENCOUNTER — Encounter (HOSPITAL_COMMUNITY): Payer: Self-pay | Admitting: Hematology

## 2022-03-23 VITALS — BP 159/65 | HR 75 | Temp 98.0°F | Resp 18 | Ht 62.21 in | Wt 172.6 lb

## 2022-03-23 VITALS — BP 156/72 | HR 66 | Temp 96.7°F | Resp 18

## 2022-03-23 DIAGNOSIS — C182 Malignant neoplasm of ascending colon: Secondary | ICD-10-CM | POA: Insufficient documentation

## 2022-03-23 DIAGNOSIS — Z5112 Encounter for antineoplastic immunotherapy: Secondary | ICD-10-CM | POA: Insufficient documentation

## 2022-03-23 DIAGNOSIS — C779 Secondary and unspecified malignant neoplasm of lymph node, unspecified: Secondary | ICD-10-CM | POA: Diagnosis not present

## 2022-03-23 DIAGNOSIS — C189 Malignant neoplasm of colon, unspecified: Secondary | ICD-10-CM

## 2022-03-23 DIAGNOSIS — Z79899 Other long term (current) drug therapy: Secondary | ICD-10-CM | POA: Diagnosis not present

## 2022-03-23 LAB — CBC WITH DIFFERENTIAL/PLATELET
Abs Immature Granulocytes: 0.03 10*3/uL (ref 0.00–0.07)
Basophils Absolute: 0 10*3/uL (ref 0.0–0.1)
Basophils Relative: 1 %
Eosinophils Absolute: 0.2 10*3/uL (ref 0.0–0.5)
Eosinophils Relative: 4 %
HCT: 39.8 % (ref 36.0–46.0)
Hemoglobin: 13.5 g/dL (ref 12.0–15.0)
Immature Granulocytes: 1 %
Lymphocytes Relative: 25 %
Lymphs Abs: 1.5 10*3/uL (ref 0.7–4.0)
MCH: 31.3 pg (ref 26.0–34.0)
MCHC: 33.9 g/dL (ref 30.0–36.0)
MCV: 92.3 fL (ref 80.0–100.0)
Monocytes Absolute: 0.5 10*3/uL (ref 0.1–1.0)
Monocytes Relative: 9 %
Neutro Abs: 3.6 10*3/uL (ref 1.7–7.7)
Neutrophils Relative %: 60 %
Platelets: 150 10*3/uL (ref 150–400)
RBC: 4.31 MIL/uL (ref 3.87–5.11)
RDW: 13.3 % (ref 11.5–15.5)
WBC: 5.9 10*3/uL (ref 4.0–10.5)
nRBC: 0 % (ref 0.0–0.2)

## 2022-03-23 LAB — COMPREHENSIVE METABOLIC PANEL
ALT: 38 U/L (ref 0–44)
AST: 35 U/L (ref 15–41)
Albumin: 4.3 g/dL (ref 3.5–5.0)
Alkaline Phosphatase: 72 U/L (ref 38–126)
Anion gap: 10 (ref 5–15)
BUN: 18 mg/dL (ref 8–23)
CO2: 30 mmol/L (ref 22–32)
Calcium: 10.1 mg/dL (ref 8.9–10.3)
Chloride: 97 mmol/L — ABNORMAL LOW (ref 98–111)
Creatinine, Ser: 1.04 mg/dL — ABNORMAL HIGH (ref 0.44–1.00)
GFR, Estimated: 56 mL/min — ABNORMAL LOW (ref >60)
Glucose, Bld: 108 mg/dL — ABNORMAL HIGH (ref 70–99)
Potassium: 2.9 mmol/L — ABNORMAL LOW (ref 3.5–5.1)
Sodium: 137 mmol/L (ref 135–145)
Total Bilirubin: 0.5 mg/dL (ref 0.3–1.2)
Total Protein: 8 g/dL (ref 6.5–8.1)

## 2022-03-23 LAB — TSH: TSH: 1.643 u[IU]/mL (ref 0.350–4.500)

## 2022-03-23 LAB — MAGNESIUM: Magnesium: 2 mg/dL (ref 1.7–2.4)

## 2022-03-23 MED ORDER — SODIUM CHLORIDE 0.9 % IV SOLN: Freq: Once | INTRAVENOUS | Status: AC

## 2022-03-23 MED ORDER — HEPARIN SOD (PORK) LOCK FLUSH 100 UNIT/ML IV SOLN
500.0000 [IU] | Freq: Once | INTRAVENOUS | Status: AC | PRN
  Administered 2022-03-23: 500 [IU]

## 2022-03-23 MED ORDER — SODIUM CHLORIDE 0.9% FLUSH
10.0000 mL | INTRAVENOUS | Status: DC | PRN
  Administered 2022-03-23 (×2): 10 mL

## 2022-03-23 MED ORDER — POTASSIUM CHLORIDE CRYS ER 20 MEQ PO TBCR: 40.0000 meq | EXTENDED_RELEASE_TABLET | Freq: Once | ORAL | 0 refills | Status: DC

## 2022-03-23 MED ORDER — SODIUM CHLORIDE 0.9 % IV SOLN
200.0000 mg | Freq: Once | INTRAVENOUS | Status: AC
  Administered 2022-03-23: 200 mg via INTRAVENOUS
  Filled 2022-03-23: qty 8

## 2022-03-23 NOTE — Progress Notes (Signed)
Treatment given today per MD orders. Tolerated infusion without adverse affects. Vital signs stable. No complaints at this time. Discharged from clinic ambulatory in stable condition. Alert and oriented x 3. F/U with [facility] as scheduled.   

## 2022-03-23 NOTE — Progress Notes (Signed)
? ?Gabrielle [name] ?[address] ?[place], [place] [address] ? ? ?CLINIC:  ?Medical Oncology/Hematology ? ?PCP:  ?Janora Norlander, DO ?[address] [place] [address] ?[phone] ? ? ?REASON FOR VISIT:  ?Follow-up for right colon cancer ? ?PRIOR THERAPY:  ?1. Right hemicolectomy on 04/08/2020. ?2. FOLFOX x 3 cycles from 05/19/2020 to 07/14/2020. ? ?NGS Results: Foundation 1 KRAS/NRAS wild-type, MSI--high, TMB 55 Muts/Mb, BRAF V 600 E ? ?CURRENT THERAPY: Keytruda every 3 weeks ? ?BRIEF ONCOLOGIC HISTORY:  ?Oncology History  ?Malignant neoplasm of colon ([place])  ?04/07/2020 Initial Diagnosis  ? Malignant neoplasm of ascending colon ([place]) ? ?  ?05/13/2020 Genetic Testing  ? Foundation One ? ? ?  ?05/19/2020 - 07/02/2020 Chemotherapy  ? The patient had palonosetron (ALOXI) injection 0.25 mg, 0.25 mg, Intravenous,  Once, 2 of 12 cycles ?Administration: 0.25 mg (05/19/2020), 0.25 mg (06/30/2020) ?leucovorin 522 mg in dextrose 5 % 250 mL infusion, 320 mg/m2 = 522 mg (80 % of original dose 400 mg/m2), Intravenous,  Once, 2 of 12 cycles ?Dose modification: 320 mg/m2 (80 % of original dose 400 mg/m2, Cycle 1, Reason: Provider Judgment), [phone] mg/m2 (66.7 % of original dose 400 mg/m2, Cycle 2, Reason: Provider Judgment) ?Administration: 522 mg (05/19/2020), 434 mg (06/30/2020) ?oxaliplatin (ELOXATIN) 110 mg in dextrose 5 % 500 mL chemo infusion, 68 mg/m2 = 110 mg (80 % of original dose 85 mg/m2), Intravenous,  Once, 1 of 1 cycle ?Dose modification: 68 mg/m2 (80 % of original dose 85 mg/m2, Cycle 1, Reason: Provider Judgment) ?Administration: 110 mg (05/19/2020) ?fluorouracil (ADRUCIL) chemo injection 500 mg, 320 mg/m2 = 500 mg (80 % of original dose 400 mg/m2), Intravenous,  Once, 2 of 12 cycles ?Dose modification: 320 mg/m2 (80 % of original dose 400 mg/m2, Cycle 1, Reason: Provider Judgment), [phone] mg/m2 (66.7 % of original dose 400 mg/m2, Cycle 2, Reason: Provider Judgment) ?Administration: 500 mg (05/19/2020), 450  mg (06/30/2020) ?fluorouracil (ADRUCIL) 3,150 mg in sodium chloride 0.9 % 87 mL chemo infusion, 1,920 mg/m2 = 3,150 mg (80 % of original dose 2,400 mg/m2), Intravenous, 1 Day/Dose, 2 of 12 cycles ?Dose modification: 1,920 mg/m2 (80 % of original dose 2,400 mg/m2, Cycle 1, Reason: Provider Judgment), 1,600 mg/m2 (66.7 % of original dose 2,400 mg/m2, Cycle 2, Reason: Provider Judgment) ?Administration: 3,150 mg (05/19/2020), 2,600 mg (06/30/2020) ? ? for chemotherapy treatment.  ? ?  ?08/03/2020 Genetic Testing  ? [employer] Reveal Testing ? ? ?  ?08/14/2020 Genetic Testing  ? No pathogenic variants detected in [facility] Common Hereditary Cancers Panel.  Variant of uncertain significance (VUS) detected in HOXB13 at c.634G>A (p.Ala212Thr). The Common Hereditary Cancers Panel offered by [facility] includes sequencing and/or deletion duplication testing of the following 48 genes: APC, ATM, AXIN2, BARD1, BMPR1A, BRCA1, BRCA2, BRIP1, CDH1, CDK4, CDKN2A (p14ARF), CDKN2A (p16INK4a), CHEK2, CTNNA1, DICER1, EPCAM (Deletion/duplication testing only), FLCN, GREM1 (promoter region deletion/duplication testing only), KIT, MEN1, MLH1, MSH2, MSH3, MSH6, MUTYH, NBN, NF1, NHTL1, PALB2, PDGFRA, PMS2, POLD1, POLE, PTEN, RAD50, RAD51C, RAD51D, RNF43, SDHB, SDHC, SDHD, SMAD4, SMARCA4. STK11, TP53, TSC1, TSC2, and VHL.  The following genes were evaluated for sequence changes only: SDHA and HOXB13 c.251G>A variant only. The report date is August 14, 2020.  ?  ?11/05/2020 Cancer Staging  ? Staging form: Colon and Rectum, AJCC 8th Edition ?- Pathologic [name] from 11/05/2020: [name] IVC (rpTX, pN0, pM1c) - Signed by Derek Jack, MD on 11/05/2020 ? ?  ?11/10/2020 -  Chemotherapy  ? Patient is on Treatment Plan :  COLORECTAL Pembrolizumab q21d  ? ?  ?  ? ? ?CANCER STAGING: ? Cancer Staging  ?Malignant neoplasm of colon ([place]) ?Staging form: Colon and Rectum, AJCC 8th Edition ?- Clinical [name] from 04/27/2020: [name] (cT4a, cN1a, cM0) - Unsigned ?-  Pathologic [name] from 11/05/2020: [name] IVC (rpTX, pN0, pM1c) - Signed by Derek Jack, MD on 11/05/2020 ? ? ?INTERVAL HISTORY:  ?[name], a 76 y.o. female, returns for routine follow-up and consideration for next cycle of chemotherapy. Gabrielle [name] was last seen on 02/09/2022. ? ?Due for cycle #21 of Keytruda today.  ? ?Overall, she tells me she has been feeling pretty well. She is taking potassium BID. She reports dry cough, and she denies SOB, diarrhea, and new pains. She denies skin rash and itching, but she reports dry skin.  ? ?Overall, she feels ready for next cycle of chemo today.  ? ? ?REVIEW OF SYSTEMS:  ?Review of Systems  ?Constitutional:  Negative for appetite change and fatigue.  ?Respiratory:  Positive for cough. Negative for shortness of breath.   ?Gastrointestinal:  Negative for diarrhea.  ?Skin:  Negative for itching and rash.  ?     Dry skin  ?All other systems reviewed and are negative. ? ?PAST MEDICAL/SURGICAL HISTORY:  ?Past Medical History:  ?Diagnosis Date  ? Arthritis   ? Cancer [facility])   ? Cataract   ? OU  ? Colon cancer ([place])   ? Family history of pancreatic cancer 08/21/2020  ? Family history of uterine cancer 08/21/2020  ? H/O cesarean section   ? Hx of tonsillectomy   ? Hyperlipidemia   ? Hypertension   ? Macular degeneration   ? Exu ARMD OU  ? Paroxysmal atrial fibrillation ([place]) 07/05/2020  ? ?Past Surgical History:  ?Procedure Laterality Date  ? BIOPSY  04/06/2020  ? Procedure: BIOPSY;  Surgeon: Rogene Houston, MD;  Location: [facility] ENDO SUITE;  Service: Endoscopy;;  ? CARPAL TUNNEL RELEASE Right   ? CATARACT EXTRACTION W/PHACO Left 10/11/2019  ? Procedure: CATARACT EXTRACTION PHACO AND INTRAOCULAR LENS PLACEMENT (IOC);  Surgeon: Baruch Goldmann, MD;  Location: [facility] ORS;  Service: Ophthalmology;  Laterality: Left;  CDE: 8.56  ? CATARACT EXTRACTION W/PHACO Right 10/25/2019  ? Procedure: CATARACT EXTRACTION PHACO AND INTRAOCULAR LENS PLACEMENT (IOC);  Surgeon: Baruch Goldmann, MD;   Location: AP ORS;  Service: Ophthalmology;  Laterality: Right;  CDE: 5.67  ? CESAREAN SECTION    ? COLONOSCOPY N/A 04/06/2020  ? Procedure: COLONOSCOPY;  Surgeon: Rogene Houston, MD;  Location: [facility] ENDO SUITE;  Service: Endoscopy;  Laterality: N/A;  ? CYSTOSCOPY WITH BIOPSY N/A 04/08/2020  ? Procedure: CYSTOSCOPY WITH BIOPSY;  Surgeon: Cleon Gustin, MD;  Location: AP ORS;  Service: Urology;  Laterality: N/A;  ? ESOPHAGOGASTRODUODENOSCOPY N/A 04/05/2020  ? Procedure: ESOPHAGOGASTRODUODENOSCOPY (EGD);  Surgeon: Rogene Houston, MD;  Location: [facility] ENDO SUITE;  Service: Endoscopy;  Laterality: N/A;  ? PARTIAL COLECTOMY N/A 04/08/2020  ? Procedure: PARTIAL COLECTOMY;  Surgeon: Virl Cagey, MD;  Location: AP ORS;  Service: General;  Laterality: N/A;  ? PORTACATH PLACEMENT Left 05/18/2020  ? Procedure: INSERTION PORT-A-CATH (ATTACHED CATHETER IN LEFT SUBCLAVIAN);  Surgeon: Virl Cagey, MD;  Location: [facility] ORS;  Service: General;  Laterality: Left;  ? TONSILLECTOMY    ? ? ?SOCIAL HISTORY:  ?Social History  ? ?Socioeconomic History  ? Marital status: Divorced  ?  Spouse name: Not on file  ? Number of children: Not on file  ? Years of education:  Not on file  ? Highest education level: Not on file  ?Occupational History  ? Not on file  ?Tobacco Use  ? Smoking status: Never  ? Smokeless tobacco: Never  ?Vaping Use  ? Vaping Use: Never used  ?Substance and Sexual Activity  ? Alcohol use: No  ? Drug use: No  ? Sexual activity: Not Currently  ?Other Topics Concern  ? Not on file  ?Social History Narrative  ? Not on file  ? ?Social Determinants of Health  ? ?Financial Resource Strain: Low Risk   ? Difficulty of Paying Living Expenses: Not hard at all  ?Food Insecurity: No Food Insecurity  ? Worried About Charity fundraiser in the Last Year: Never true  ? Ran Out of Food in the Last Year: Never true  ?Transportation Needs: No Transportation Needs  ? Lack of Transportation (Medical): No  ? Lack of Transportation  (Non-Medical): No  ?Physical Activity: Sufficiently Active  ? Days of Exercise per Week: 5 days  ? Minutes of Exercise per Session: 30 min  ?Stress: No Stress Concern Present  ? Feeling of Stress : Not at all  ?So

## 2022-03-23 NOTE — Patient Instructions (Signed)
[facility]  Discharge Instructions: Thank you for choosing [facility] to provide your oncology and hematology care.  If you have a lab appointment with the Cancer Center, please come in thru the Main Entrance and check in at the main information desk.  Wear comfortable clothing and clothing appropriate for easy access to any Portacath or PICC line.   We strive to give you quality time with your provider. You may need to reschedule your appointment if you arrive late (15 or more minutes).  Arriving late affects you and other patients whose appointments are after yours.  Also, if you miss three or more appointments without notifying the office, you may be dismissed from the clinic at the provider's discretion.      For prescription refill requests, have your pharmacy contact our office and allow 72 hours for refills to be completed.    Today you received the following chemotherapy and/or immunotherapy agents Keytruda       To help prevent nausea and vomiting after your treatment, we encourage you to take your nausea medication as directed.  BELOW ARE SYMPTOMS THAT SHOULD BE REPORTED IMMEDIATELY: *FEVER GREATER THAN 100.4 F (38 C) OR HIGHER *CHILLS OR SWEATING *NAUSEA AND VOMITING THAT IS NOT CONTROLLED WITH YOUR NAUSEA MEDICATION *UNUSUAL SHORTNESS OF BREATH *UNUSUAL BRUISING OR BLEEDING *URINARY PROBLEMS (pain or burning when urinating, or frequent urination) *BOWEL PROBLEMS (unusual diarrhea, constipation, pain near the anus) TENDERNESS IN MOUTH AND THROAT WITH OR WITHOUT PRESENCE OF ULCERS (sore throat, sores in mouth, or a toothache) UNUSUAL RASH, SWELLING OR PAIN  UNUSUAL VAGINAL DISCHARGE OR ITCHING   Items with * indicate a potential emergency and should be followed up as soon as possible or go to the Emergency Department if any problems should occur.  Please show the CHEMOTHERAPY ALERT CARD or IMMUNOTHERAPY ALERT CARD at check-in to the Emergency  Department and triage nurse.  Should you have questions after your visit or need to cancel or reschedule your appointment, please contact [facility] [phone]  and follow the prompts.  Office hours are 8:00 a.m. to 4:30 p.m. Monday - Friday. Please note that voicemails left after 4:00 p.m. may not be returned until the following business day.  We are closed weekends and major holidays. You have access to a nurse at all times for urgent questions. Please call the main number to the clinic [phone] and follow the prompts.  For any non-urgent questions, you may also contact your provider using MyChart. We now offer e-Visits for anyone 18 and older to request care online for non-urgent symptoms. For details visit [email or web address].   Also download the MyChart app! Go to the app store, search "MyChart", open the app, select [facility], and log in with your MyChart username and password.  Due to Covid, a mask is required upon entering the hospital/clinic. If you do not have a mask, one will be given to you upon arrival. For doctor visits, patients may have 1 support person aged 18 or older with them. For treatment visits, patients cannot have anyone with them due to current Covid guidelines and our immunocompromised population.  

## 2022-03-23 NOTE — Progress Notes (Signed)
Patient has been examined by Dr. Katragadda, and vital signs and labs have been reviewed. ANC, Creatinine, LFTs, hemoglobin, and platelets are within treatment parameters per M.D. - pt may proceed with treatment.    °

## 2022-03-23 NOTE — Patient Instructions (Addendum)
Gabrielle [name] at [facility] ?Discharge Instructions ? ? ?You were seen and examined today by Dr. Delton Coombes. ? ?He reviewed your lab work. Your potassium is low today. Continue taking potassium 1 pill twice a day. All other lab work was normal/stable.  ? ?We will proceed with your infusion today.  ? ?Return as scheduled.  ? ? ?Thank you for choosing [place] at [facility] to provide your oncology and hematology care.  To afford each patient quality time with our provider, please arrive at least 15 minutes before your scheduled appointment time.  ? ?If you have a lab appointment with the [place] please come in thru the Main Entrance and check in at the main information desk. ? ?You need to re-schedule your appointment should you arrive 10 or more minutes late.  We strive to give you quality time with our providers, and arriving late affects you and other patients whose appointments are after yours.  Also, if you no show three or more times for appointments you may be dismissed from the clinic at the providers discretion.     ?Again, thank you for choosing [facility].  Our hope is that these requests will decrease the amount of time that you wait before being seen by our physicians.       ?_____________________________________________________________ ? ?Should you have questions after your visit to [facility], please contact our office at [phone] and follow the prompts.  Our office hours are 8:00 a.m. and 4:30 p.m. Monday - Friday.  Please note that voicemails left after 4:00 p.m. may not be returned until the following business day.  We are closed weekends and major holidays.  You do have access to a nurse 24-7, just call the main number to the clinic [phone] and do not press any options, hold on the line and a nurse will answer the phone.   ? ?For prescription refill requests, have your pharmacy contact our office and allow  72 hours.   ? ?Due to Covid, you will need to wear a mask upon entering the hospital. If you do not have a mask, a mask will be given to you at the Main Entrance upon arrival. For doctor visits, patients may have 1 support person age 70 or older with them. For treatment visits, patients can not have anyone with them due to social distancing guidelines and our immunocompromised population.  ? ?   ?

## 2022-03-24 LAB — CEA: CEA: 2.3 ng/mL (ref 0.0–4.7)

## 2022-04-04 NOTE — Progress Notes (Signed)
?[facility] Note ? ?04/14/2022 ?  ? ?CHIEF COMPLAINT ?Patient presents for Retina Follow Up ? ?HISTORY OF PRESENT ILLNESS: ?Gabrielle [name] is a 76 y.o. female who presents to the clinic today for:  ? ?HPI   ? ? Retina Follow Up   ?Patient presents with  Wet AMD.  In both eyes.  This started 6 weeks ago.  I, the attending physician,  performed the HPI with the patient and updated documentation appropriately. ? ?  ?  ? ? Comments   ?Patient here for 6 weeks retina follow up for exu ARMD OU. Patient states vision about the same. Had a cancer treatment yesterday. No eye pain.  ? ?  ?  ?Last edited by Bernarda Caffey, MD on 04/14/2022  1:16 PM.  ?  ?Pt states no change in vision, eye are watering from allergies ? ?Referring physician: ?Janora Norlander, DO ?[address] [place],  [place] [address] ? ?HISTORICAL INFORMATION:  ? ?Selected notes from the [facility] ?Referred by Dr. Cristela Blue for concern of ARMD OU  ? ?CURRENT MEDICATIONS: ?Current Outpatient Medications (Ophthalmic Drugs)  ?Medication Sig  ? dorzolamide-timolol (COSOPT) 22.3-6.8 MG/ML ophthalmic solution INSTILL ONE DROP IN [facility] EYE TWICE DAILY  ? ?No current facility-administered medications for this visit. (Ophthalmic Drugs)  ? ?Current Outpatient Medications (Other)  ?Medication Sig  ? alendronate (FOSAMAX) 70 MG tablet Take 1 tablet (70 mg total) by mouth every 7 (seven) days. Take with a full glass of water on an empty stomach.  ? amLODipine (NORVASC) 5 MG tablet Take 1 tablet (5 mg total) by mouth daily.  ? atorvastatin (LIPITOR) 20 MG tablet Take 1 tablet (20 mg total) by mouth daily.  ? benzonatate (TESSALON PERLES) 100 MG capsule Take 1 capsule (100 mg total) by mouth 3 (three) times daily as needed.  ? cholecalciferol (VITAMIN D3) 25 MCG (1000 UT) tablet Take 1,000 Units by mouth daily.  ? CRANBERRY FRUIT PO Take 1 tablet by mouth daily.  ? diclofenac Sodium (VOLTAREN) 1 % GEL APPLY 4 GRAMS TO AFFECTED AREA(S) 4  TIMES A DAY  ? ELIQUIS 5 MG TABS tablet Take 1 tablet (5 mg total) by mouth 2 (two) times daily.  ? FEROSUL 325 (65 Fe) MG tablet TAKE  (1)  TABLET TWICE A DAY WITH MEALS (BREAKFAST AND SUPPER)  ? Garlic 678 MG TABS Take by mouth.  ? lidocaine-prilocaine (EMLA) cream Apply a small amount to port a cath site and cover with plastic wrap 1 hour prior to chemotherapy appointments  ? losartan-hydrochlorothiazide (HYZAAR) 100-25 MG tablet TAKE 1 TABLET DAILY  ? prochlorperazine (COMPAZINE) 10 MG tablet Take 1 tablet (10 mg total) by mouth every 6 (six) hours as needed for nausea or vomiting.  ? traMADol (ULTRAM) 50 MG tablet Take 1 tablet (50 mg total) by mouth 2 (two) times daily as needed.  ? potassium chloride SA (KLOR-CON M) 20 MEQ tablet Take 1 tablet (20 mEq total) by mouth 2 (two) times daily.  ? ?No current facility-administered medications for this visit. (Other)  ? ?REVIEW OF SYSTEMS: ?ROS   ?Positive for: Gastrointestinal, Genitourinary, Musculoskeletal, Cardiovascular, Eyes ?Negative for: Constitutional, Neurological, Skin, HENT, Endocrine, Respiratory, Psychiatric, Allergic/Imm, Heme/Lymph ?Last edited by Theodore Demark, COA on 04/14/2022  8:21 AM.  ?  ? ?ALLERGIES ?Allergies  ?Allergen Reactions  ? Lisinopril Cough  ? ?PAST MEDICAL HISTORY ?Past Medical History:  ?Diagnosis Date  ? Arthritis   ? Cancer (  [place])   ? Cataract   ? OU  ? Colon cancer ([place])   ? Family history of pancreatic cancer 08/21/2020  ? Family history of uterine cancer 08/21/2020  ? H/O cesarean section   ? Hx of tonsillectomy   ? Hyperlipidemia   ? Hypertension   ? Macular degeneration   ? Exu ARMD OU  ? Paroxysmal atrial fibrillation ([place]) 07/05/2020  ? ?Past Surgical History:  ?Procedure Laterality Date  ? BIOPSY  04/06/2020  ? Procedure: BIOPSY;  Surgeon: Rogene Houston, MD;  Location: [facility];  Service: Endoscopy;;  ? CARPAL TUNNEL RELEASE Right   ? CATARACT EXTRACTION W/PHACO Left 10/11/2019  ? Procedure: CATARACT EXTRACTION PHACO  AND INTRAOCULAR LENS PLACEMENT (IOC);  Surgeon: Baruch Goldmann, MD;  Location: [facility] ORS;  Service: Ophthalmology;  Laterality: Left;  CDE: 8.56  ? CATARACT EXTRACTION W/PHACO Right 10/25/2019  ? Procedure: CATARACT EXTRACTION PHACO AND INTRAOCULAR LENS PLACEMENT (IOC);  Surgeon: Baruch Goldmann, MD;  Location: [facility] OR[facility];  Service: Ophthalmology;  Laterality: Right;  CDE: 5.67  ? CESAREAN SECTION    ? COLONOSCOPY N/A 04/06/2020  ? Procedure: COLONOSCOPY;  Surgeon: Rogene Houston, MD;  Location: [facility];  Service: Endoscopy;  Laterality: N/A;  ? CYSTOSCOPY WITH BIOPSY N/A 04/08/2020  ? Procedure: CYSTOSCOPY WITH BIOPSY;  Surgeon: Cleon Gustin, MD;  Location: [facility] OR[facility];  Service: Urology;  Laterality: N/A;  ? ESOPHAGOGASTRODUODENOSCOPY N/A 04/05/2020  ? Procedure: ESOPHAGOGASTRODUODENOSCOPY (EGD);  Surgeon: Rogene Houston, MD;  [facility];  Service: Endoscopy;  Laterality: N/A;  ? PARTIAL COLECTOMY N/A 04/08/2020  ? Procedure: PARTIAL COLECTOMY;  Surgeon: Virl Cagey, MD;  Location: [facility] OR[facility];  Service: General;  Laterality: N/A;  ? PORTACATH PLACEMENT Left 05/18/2020  ? Procedure: INSERTION PORT-A-CATH (ATTACHED CATHETER IN LEFT SUBCLAVIAN);  Surgeon: Virl Cagey, MD;  Location: [facility];  Service: General;  Laterality: Left;  ? TONSILLECTOMY    ? ?FAMILY HISTORY ?Family History  ?Problem Relation Age of Onset  ? Macular degeneration Mother   ? Stroke Mother   ? Hypertension Mother   ? Dementia Mother   ? Lung cancer Father   ?     dx late 38s; smoking hx  ? Diabetes Sister   ? Hypertension Sister   ? Leukemia Paternal Uncle   ?     d. 73s  ? Cancer Maternal Aunt   ?     ovarian or endometrial dx 54s  ? Pancreatic cancer Paternal Uncle   ?     d. late 37s  ? ?SOCIAL HISTORY ?Social History  ? ?Tobacco Use  ? Smoking status: Never  ? Smokeless tobacco: Never  ?Vaping Use  ? Vaping Use: Never used  ?Substance Use Topics  ? Alcohol use: No  ? Drug use: No  ?  ? ?  ?OPHTHALMIC EXAM: ?Base Eye Exam    ? ? Visual Acuity (Snellen - Linear)   ? ?   Right Left  ? Dist cc CF at 3' 20/70 -1  ? Dist ph cc NI NI  ? ?  ?  ? ? Tonometry (Tonopen, 8:19 AM)   ? ?   Right Left  ? Pressure 17 16  ? ?  ?  ? ? Pupils   ? ?   Dark Light Shape React APD  ? Right 4 3 Round Brisk None  ? Left 4 3 Round Brisk None  ? ?  ?  ? ? Visual Fields (Counting fingers)   ? ?  Left Right  ?  Full Full  ? ?  ?  ? ? Extraocular Movement   ? ?   Right Left  ?  Full, Ortho Full, Ortho  ? ?  ?  ? ? Neuro/Psych   ? ? Oriented x3: Yes  ? Mood/Affect: Normal  ? ?  ?  ? ? Dilation   ? ? Both eyes: 1.0% Mydriacyl, 2.5% Phenylephrine @ 8:19 AM  ? ?  ?  ? ?  ? ?Slit Lamp and Fundus Exam   ? ? Slit Lamp Exam   ? ?   Right Left  ? Lids/Lashes Dermatochalasis - upper lid Dermatochalasis - upper lid  ? Conjunctiva/Sclera 1+ Injection White and quiet  ? Cornea Arcus, 3+ fine Punctpate epithelial erosions Arcus, 3+ fine Punctate epithelial erosions, mild tear film debris  ? Anterior Chamber Deep and quiet Deep and quiet  ? Iris Round and well dilated Round and well dilated  ? Lens PCIOL in good position, 1+ Posterior capsular opacification PC IOL in good position with open PC  ? Anterior Vitreous Vitreous syneresis, Posterior vitreous detachment, vitreous condensations Vitreous syneresis, Posterior vitreous detachment, silicone oil micro drops  ? ?  ?  ? ? Fundus Exam   ? ?   Right Left  ? Disc pink and sharp, compact mild pallor, sharp, Compact, vascular loops superiorly  ? C/D Ratio 0.2 0.2  ? Macula Blunted foveal reflex, Drusen, Pigment clumping and atrophy, central thickening/pigmented disciform scar, +PED, no heme, persistent, trace cystic changes overlying central scar - stable Blunted foveal reflex, central thickening, RPE clumping and atrophy, Drusen, RPE rip, trace cystic changes overlying PED - persistent / slightly improved, no heme  ? Vessels Vascular attenuation, Tortuous Vascular attenuation, Tortuous  ? Periphery Attached, mild Reticular  degeneration Attached, mild Reticular degeneration  ? ?  ?  ? ?  ? ?Refraction   ? ? Wearing Rx   ? ?   Sphere Cylinder Add  ? Right [place] Sphere +3.50  ? Left -0.50 Sphere +3.50  ? ? Type: PAL  ? ?  ?  ? ?

## 2022-04-13 ENCOUNTER — Other Ambulatory Visit: Payer: Self-pay | Admitting: *Deleted

## 2022-04-13 ENCOUNTER — Other Ambulatory Visit: Payer: Self-pay | Admitting: Family Medicine

## 2022-04-13 ENCOUNTER — Inpatient Hospital Stay (HOSPITAL_COMMUNITY): Payer: Medicare HMO

## 2022-04-13 ENCOUNTER — Inpatient Hospital Stay (HOSPITAL_COMMUNITY): Payer: Medicare HMO | Attending: Hematology

## 2022-04-13 VITALS — BP 157/57 | HR 69 | Temp 97.7°F | Resp 18

## 2022-04-13 DIAGNOSIS — C182 Malignant neoplasm of ascending colon: Secondary | ICD-10-CM | POA: Diagnosis not present

## 2022-04-13 DIAGNOSIS — Z5112 Encounter for antineoplastic immunotherapy: Secondary | ICD-10-CM | POA: Insufficient documentation

## 2022-04-13 DIAGNOSIS — Z79899 Other long term (current) drug therapy: Secondary | ICD-10-CM | POA: Insufficient documentation

## 2022-04-13 DIAGNOSIS — C189 Malignant neoplasm of colon, unspecified: Secondary | ICD-10-CM

## 2022-04-13 DIAGNOSIS — E876 Hypokalemia: Secondary | ICD-10-CM

## 2022-04-13 LAB — CBC WITH DIFFERENTIAL/PLATELET
Abs Immature Granulocytes: 0.03 10*3/uL (ref 0.00–0.07)
Basophils Absolute: 0 10*3/uL (ref 0.0–0.1)
Basophils Relative: 1 %
Eosinophils Absolute: 0.2 10*3/uL (ref 0.0–0.5)
Eosinophils Relative: 4 %
HCT: 38.3 % (ref 36.0–46.0)
Hemoglobin: 12.9 g/dL (ref 12.0–15.0)
Immature Granulocytes: 1 %
Lymphocytes Relative: 21 %
Lymphs Abs: 1.4 10*3/uL (ref 0.7–4.0)
MCH: 31 pg (ref 26.0–34.0)
MCHC: 33.7 g/dL (ref 30.0–36.0)
MCV: 92.1 fL (ref 80.0–100.0)
Monocytes Absolute: 0.6 10*3/uL (ref 0.1–1.0)
Monocytes Relative: 9 %
Neutro Abs: 4.3 10*3/uL (ref 1.7–7.7)
Neutrophils Relative %: 64 %
Platelets: 147 10*3/uL — ABNORMAL LOW (ref 150–400)
RBC: 4.16 MIL/uL (ref 3.87–5.11)
RDW: 13.3 % (ref 11.5–15.5)
WBC: 6.5 10*3/uL (ref 4.0–10.5)
nRBC: 0 % (ref 0.0–0.2)

## 2022-04-13 LAB — COMPREHENSIVE METABOLIC PANEL
ALT: 35 U/L (ref 0–44)
AST: 33 U/L (ref 15–41)
Albumin: 4.1 g/dL (ref 3.5–5.0)
Alkaline Phosphatase: 70 U/L (ref 38–126)
Anion gap: 11 (ref 5–15)
BUN: 19 mg/dL (ref 8–23)
CO2: 27 mmol/L (ref 22–32)
Calcium: 9.9 mg/dL (ref 8.9–10.3)
Chloride: 99 mmol/L (ref 98–111)
Creatinine, Ser: 1.05 mg/dL — ABNORMAL HIGH (ref 0.44–1.00)
GFR, Estimated: 55 mL/min — ABNORMAL LOW (ref >60)
Glucose, Bld: 96 mg/dL (ref 70–99)
Potassium: 3.1 mmol/L — ABNORMAL LOW (ref 3.5–5.1)
Sodium: 137 mmol/L (ref 135–145)
Total Bilirubin: 0.5 mg/dL (ref 0.3–1.2)
Total Protein: 7.6 g/dL (ref 6.5–8.1)

## 2022-04-13 LAB — MAGNESIUM: Magnesium: 2 mg/dL (ref 1.7–2.4)

## 2022-04-13 MED ORDER — SODIUM CHLORIDE 0.9 % IV SOLN
200.0000 mg | Freq: Once | INTRAVENOUS | Status: AC
  Administered 2022-04-13: 200 mg via INTRAVENOUS
  Filled 2022-04-13: qty 8

## 2022-04-13 MED ORDER — HEPARIN SOD (PORK) LOCK FLUSH 100 UNIT/ML IV SOLN
500.0000 [IU] | Freq: Once | INTRAVENOUS | Status: AC | PRN
  Administered 2022-04-13: 500 [IU]

## 2022-04-13 MED ORDER — SODIUM CHLORIDE 0.9% FLUSH
10.0000 mL | INTRAVENOUS | Status: DC | PRN
  Administered 2022-04-13: 10 mL

## 2022-04-13 MED ORDER — POTASSIUM CHLORIDE ER 10 MEQ PO TBCR: 10.0000 meq | EXTENDED_RELEASE_TABLET | Freq: Every day | ORAL | 1 refills | Status: DC

## 2022-04-13 MED ORDER — SODIUM CHLORIDE 0.9 % IV SOLN: Freq: Once | INTRAVENOUS | Status: AC

## 2022-04-13 NOTE — Progress Notes (Signed)
Treatment given per orders. Patient tolerated it well without problems. Vitals stable and discharged home from clinic ambulatory. Follow up as scheduled.  

## 2022-04-13 NOTE — Progress Notes (Signed)
Discussed with Tarri Abernethy PAC and will start on K+ 10 meq daily and recheck K+ on 5/31 as planned.

## 2022-04-13 NOTE — Progress Notes (Addendum)
Potassium noted to be 3.1 from today.  Reviewed with [place] who has ordered potassium 10 meq daily, as it was not on her med list, and will recheck on 5/31.  However, patient is supposed to be taking 20 meq bid. Spoke to daughter Ashok Norris, who stated that she was under the impression that she had been taking potassium daily, however she does not like to take them and is possible she is not taking the full dose daily.  Advised her to clarify with patient as to what she is actually taking so that we can adjust as needed.  Script sent to pharmacy for 20 meq BID and clarified.  Verbalized understanding. ?

## 2022-04-13 NOTE — Patient Instructions (Signed)
[facility]  Discharge Instructions: Thank you for choosing [facility] to provide your oncology and hematology care.  If you have a lab appointment with the Cancer Center, please come in thru the Main Entrance and check in at the main information desk.  Wear comfortable clothing and clothing appropriate for easy access to any Portacath or PICC line.   We strive to give you quality time with your provider. You may need to reschedule your appointment if you arrive late (15 or more minutes).  Arriving late affects you and other patients whose appointments are after yours.  Also, if you miss three or more appointments without notifying the office, you may be dismissed from the clinic at the provider's discretion.      For prescription refill requests, have your pharmacy contact our office and allow 72 hours for refills to be completed.    Today you received the following chemotherapy and/or immunotherapy agents Keytruda       To help prevent nausea and vomiting after your treatment, we encourage you to take your nausea medication as directed.  BELOW ARE SYMPTOMS THAT SHOULD BE REPORTED IMMEDIATELY: *FEVER GREATER THAN 100.4 F (38 C) OR HIGHER *CHILLS OR SWEATING *NAUSEA AND VOMITING THAT IS NOT CONTROLLED WITH YOUR NAUSEA MEDICATION *UNUSUAL SHORTNESS OF BREATH *UNUSUAL BRUISING OR BLEEDING *URINARY PROBLEMS (pain or burning when urinating, or frequent urination) *BOWEL PROBLEMS (unusual diarrhea, constipation, pain near the anus) TENDERNESS IN MOUTH AND THROAT WITH OR WITHOUT PRESENCE OF ULCERS (sore throat, sores in mouth, or a toothache) UNUSUAL RASH, SWELLING OR PAIN  UNUSUAL VAGINAL DISCHARGE OR ITCHING   Items with * indicate a potential emergency and should be followed up as soon as possible or go to the Emergency Department if any problems should occur.  Please show the CHEMOTHERAPY ALERT CARD or IMMUNOTHERAPY ALERT CARD at check-in to the Emergency  Department and triage nurse.  Should you have questions after your visit or need to cancel or reschedule your appointment, please contact [facility] [phone]  and follow the prompts.  Office hours are 8:00 a.m. to 4:30 p.m. Monday - Friday. Please note that voicemails left after 4:00 p.m. may not be returned until the following business day.  We are closed weekends and major holidays. You have access to a nurse at all times for urgent questions. Please call the main number to the clinic [phone] and follow the prompts.  For any non-urgent questions, you may also contact your provider using MyChart. We now offer e-Visits for anyone 18 and older to request care online for non-urgent symptoms. For details visit [email or web address].   Also download the MyChart app! Go to the app store, search "MyChart", open the app, select [facility], and log in with your MyChart username and password.  Due to Covid, a mask is required upon entering the hospital/clinic. If you do not have a mask, one will be given to you upon arrival. For doctor visits, patients may have 1 support person aged 18 or older with them. For treatment visits, patients cannot have anyone with them due to current Covid guidelines and our immunocompromised population.  

## 2022-04-14 ENCOUNTER — Encounter (INDEPENDENT_AMBULATORY_CARE_PROVIDER_SITE_OTHER): Payer: Self-pay | Admitting: Ophthalmology

## 2022-04-14 ENCOUNTER — Other Ambulatory Visit: Payer: Self-pay | Admitting: *Deleted

## 2022-04-14 ENCOUNTER — Ambulatory Visit (INDEPENDENT_AMBULATORY_CARE_PROVIDER_SITE_OTHER): Payer: Medicare HMO | Admitting: Ophthalmology

## 2022-04-14 DIAGNOSIS — H353231 Exudative age-related macular degeneration, bilateral, with active choroidal neovascularization: Secondary | ICD-10-CM | POA: Diagnosis not present

## 2022-04-14 DIAGNOSIS — H43813 Vitreous degeneration, bilateral: Secondary | ICD-10-CM

## 2022-04-14 DIAGNOSIS — H40053 Ocular hypertension, bilateral: Secondary | ICD-10-CM

## 2022-04-14 DIAGNOSIS — Z961 Presence of intraocular lens: Secondary | ICD-10-CM

## 2022-04-14 DIAGNOSIS — H04123 Dry eye syndrome of bilateral lacrimal glands: Secondary | ICD-10-CM

## 2022-04-14 MED ORDER — AFLIBERCEPT 2MG/0.05ML IZ SOLN FOR KALEIDOSCOPE
2.0000 mg | INTRAVITREAL | Status: AC | PRN
  Administered 2022-04-14: 2 mg via INTRAVITREAL

## 2022-04-14 MED ORDER — POTASSIUM CHLORIDE CRYS ER 20 MEQ PO TBCR: 20.0000 meq | EXTENDED_RELEASE_TABLET | Freq: Two times a day (BID) | ORAL | 3 refills | Status: DC

## 2022-04-21 ENCOUNTER — Other Ambulatory Visit (HOSPITAL_COMMUNITY): Payer: Self-pay | Admitting: Hematology

## 2022-05-04 ENCOUNTER — Inpatient Hospital Stay (HOSPITAL_BASED_OUTPATIENT_CLINIC_OR_DEPARTMENT_OTHER): Payer: Medicare HMO | Admitting: Hematology

## 2022-05-04 ENCOUNTER — Inpatient Hospital Stay (HOSPITAL_COMMUNITY): Payer: Medicare HMO

## 2022-05-04 ENCOUNTER — Encounter (HOSPITAL_COMMUNITY): Payer: Self-pay | Admitting: Hematology

## 2022-05-04 VITALS — BP 189/76 | HR 76 | Temp 98.8°F | Resp 18 | Wt 174.7 lb

## 2022-05-04 VITALS — BP 166/62 | HR 66 | Temp 97.3°F | Resp 18

## 2022-05-04 DIAGNOSIS — Z79899 Other long term (current) drug therapy: Secondary | ICD-10-CM | POA: Diagnosis not present

## 2022-05-04 DIAGNOSIS — C189 Malignant neoplasm of colon, unspecified: Secondary | ICD-10-CM | POA: Diagnosis not present

## 2022-05-04 DIAGNOSIS — Z5112 Encounter for antineoplastic immunotherapy: Secondary | ICD-10-CM | POA: Diagnosis not present

## 2022-05-04 DIAGNOSIS — E876 Hypokalemia: Secondary | ICD-10-CM

## 2022-05-04 DIAGNOSIS — C182 Malignant neoplasm of ascending colon: Secondary | ICD-10-CM | POA: Diagnosis not present

## 2022-05-04 LAB — CBC WITH DIFFERENTIAL/PLATELET
Abs Immature Granulocytes: 0.03 10*3/uL (ref 0.00–0.07)
Basophils Absolute: 0 10*3/uL (ref 0.0–0.1)
Basophils Relative: 0 %
Eosinophils Absolute: 0.2 10*3/uL (ref 0.0–0.5)
Eosinophils Relative: 2 %
HCT: 37.4 % (ref 36.0–46.0)
Hemoglobin: 12.5 g/dL (ref 12.0–15.0)
Immature Granulocytes: 0 %
Lymphocytes Relative: 25 %
Lymphs Abs: 1.7 10*3/uL (ref 0.7–4.0)
MCH: 31 pg (ref 26.0–34.0)
MCHC: 33.4 g/dL (ref 30.0–36.0)
MCV: 92.8 fL (ref 80.0–100.0)
Monocytes Absolute: 0.8 10*3/uL (ref 0.1–1.0)
Monocytes Relative: 12 %
Neutro Abs: 4.1 10*3/uL (ref 1.7–7.7)
Neutrophils Relative %: 61 %
Platelets: 158 10*3/uL (ref 150–400)
RBC: 4.03 MIL/uL (ref 3.87–5.11)
RDW: 13.5 % (ref 11.5–15.5)
WBC: 6.9 10*3/uL (ref 4.0–10.5)
nRBC: 0 % (ref 0.0–0.2)

## 2022-05-04 LAB — COMPREHENSIVE METABOLIC PANEL
ALT: 35 U/L (ref 0–44)
AST: 36 U/L (ref 15–41)
Albumin: 4 g/dL (ref 3.5–5.0)
Alkaline Phosphatase: 65 U/L (ref 38–126)
Anion gap: 5 (ref 5–15)
BUN: 22 mg/dL (ref 8–23)
CO2: 33 mmol/L — ABNORMAL HIGH (ref 22–32)
Calcium: 9.7 mg/dL (ref 8.9–10.3)
Chloride: 98 mmol/L (ref 98–111)
Creatinine, Ser: 1.03 mg/dL — ABNORMAL HIGH (ref 0.44–1.00)
GFR, Estimated: 57 mL/min — ABNORMAL LOW (ref >60)
Glucose, Bld: 80 mg/dL (ref 70–99)
Potassium: 3.1 mmol/L — ABNORMAL LOW (ref 3.5–5.1)
Sodium: 136 mmol/L (ref 135–145)
Total Bilirubin: 0.6 mg/dL (ref 0.3–1.2)
Total Protein: 7.4 g/dL (ref 6.5–8.1)

## 2022-05-04 LAB — TSH: TSH: 1.801 u[IU]/mL (ref 0.350–4.500)

## 2022-05-04 LAB — MAGNESIUM: Magnesium: 2.1 mg/dL (ref 1.7–2.4)

## 2022-05-04 MED ORDER — SODIUM CHLORIDE 0.9% FLUSH
10.0000 mL | INTRAVENOUS | Status: DC | PRN
  Administered 2022-05-04: 10 mL

## 2022-05-04 MED ORDER — HEPARIN SOD (PORK) LOCK FLUSH 100 UNIT/ML IV SOLN
500.0000 [IU] | Freq: Once | INTRAVENOUS | Status: AC | PRN
  Administered 2022-05-04: 500 [IU]

## 2022-05-04 MED ORDER — SODIUM CHLORIDE 0.9 % IV SOLN
200.0000 mg | Freq: Once | INTRAVENOUS | Status: AC
  Administered 2022-05-04: 200 mg via INTRAVENOUS
  Filled 2022-05-04: qty 8

## 2022-05-04 MED ORDER — POTASSIUM CHLORIDE CRYS ER 20 MEQ PO TBCR
40.0000 meq | EXTENDED_RELEASE_TABLET | Freq: Once | ORAL | Status: AC
  Administered 2022-05-04: 40 meq via ORAL
  Filled 2022-05-04: qty 2

## 2022-05-04 MED ORDER — SODIUM CHLORIDE 0.9 % IV SOLN: Freq: Once | INTRAVENOUS | Status: AC

## 2022-05-04 MED ORDER — POTASSIUM CHLORIDE CRYS ER 20 MEQ PO TBCR
20.0000 meq | EXTENDED_RELEASE_TABLET | Freq: Three times a day (TID) | ORAL | 3 refills | Status: DC
Start: 2022-05-04 — End: 2022-08-31

## 2022-05-04 NOTE — Progress Notes (Signed)
[place], [place] [address]   CLINIC:  Medical Oncology/Hematology  PCP:  Janora Norlander, DO [address] [place] [address] [phone]   REASON FOR VISIT:  Follow-up for right colon cancer  PRIOR THERAPY:  1. Right hemicolectomy on 04/08/2020. 2. FOLFOX x 3 cycles from 05/19/2020 to 07/14/2020.  NGS Results: Foundation 1 KRAS/NRAS wild-type, MSI--high, TMB 55 Muts/Mb, BRAF V 600 E  CURRENT THERAPY: Keytruda every 3 weeks  BRIEF ONCOLOGIC HISTORY:  Oncology History  Malignant neoplasm of colon ([place])  04/07/2020 Initial Diagnosis   Malignant neoplasm of ascending colon ([place])    05/13/2020 Genetic Testing   Foundation One     05/19/2020 - 07/02/2020 Chemotherapy   The patient had palonosetron (ALOXI) injection 0.25 mg, 0.25 mg, Intravenous,  Once, 2 of 12 cycles Administration: 0.25 mg (05/19/2020), 0.25 mg (06/30/2020) leucovorin 522 mg in dextrose 5 % 250 mL infusion, 320 mg/m2 = 522 mg (80 % of original dose 400 mg/m2), Intravenous,  Once, 2 of 12 cycles Dose modification: 320 mg/m2 (80 % of original dose 400 mg/m2, Cycle 1, Reason: Provider Judgment), 785.8850 mg/m2 (66.7 % of original dose 400 mg/m2, Cycle 2, Reason: Provider Judgment) Administration: 522 mg (05/19/2020), 434 mg (06/30/2020) oxaliplatin (ELOXATIN) 110 mg in dextrose 5 % 500 mL chemo infusion, 68 mg/m2 = 110 mg (80 % of original dose 85 mg/m2), Intravenous,  Once, 1 of 1 cycle Dose modification: 68 mg/m2 (80 % of original dose 85 mg/m2, Cycle 1, Reason: Provider Judgment) Administration: 110 mg (05/19/2020) fluorouracil (ADRUCIL) chemo injection 500 mg, 320 mg/m2 = 500 mg (80 % of original dose 400 mg/m2), Intravenous,  Once, 2 of 12 cycles Dose modification: 320 mg/m2 (80 % of original dose 400 mg/m2, Cycle 1, Reason: Provider Judgment), 277.4128 mg/m2 (66.7 % of original dose 400 mg/m2, Cycle 2, Reason: Provider Judgment) Administration: 500 mg (05/19/2020), 450  mg (06/30/2020) fluorouracil (ADRUCIL) 3,150 mg in sodium chloride 0.9 % 87 mL chemo infusion, 1,920 mg/m2 = 3,150 mg (80 % of original dose 2,400 mg/m2), Intravenous, 1 Day/Dose, 2 of 12 cycles Dose modification: 1,920 mg/m2 (80 % of original dose 2,400 mg/m2, Cycle 1, Reason: Provider Judgment), 1,600 mg/m2 (66.7 % of original dose 2,400 mg/m2, Cycle 2, Reason: Provider Judgment) Administration: 3,150 mg (05/19/2020), 2,600 mg (06/30/2020)   for chemotherapy treatment.     08/03/2020 Genetic Testing   Guardant Reveal Testing     08/14/2020 Genetic Testing   No pathogenic variants detected in [facility] Common Hereditary Cancers Panel.  Variant of uncertain significance (VUS) detected in HOXB13 at c.634G>A (p.Ala212Thr). The Common Hereditary Cancers Panel offered by [facility] includes sequencing and/or deletion duplication testing of the following 48 genes: APC, ATM, AXIN2, BARD1, BMPR1A, BRCA1, BRCA2, BRIP1, CDH1, CDK4, CDKN2A (p14ARF), CDKN2A (p16INK4a), CHEK2, CTNNA1, DICER1, EPCAM (Deletion/duplication testing only), FLCN, GREM1 (promoter region deletion/duplication testing only), KIT, MEN1, MLH1, MSH2, MSH3, MSH6, MUTYH, NBN, NF1, NHTL1, PALB2, PDGFRA, PMS2, POLD1, POLE, PTEN, RAD50, RAD51C, RAD51D, RNF43, SDHB, SDHC, SDHD, SMAD4, SMARCA4. STK11, TP53, TSC1, TSC2, and VHL.  The following genes were evaluated for sequence changes only: SDHA and HOXB13 c.251G>A variant only. The report date is August 14, 2020.    11/05/2020 Cancer Staging   Staging form: Colon and Rectum, AJCC 8th Edition - Pathologic stage from 11/05/2020: Stage IVC (rpTX, pN0, pM1c) - Signed by Derek Jack, MD on 11/05/2020    11/10/2020 -  Chemotherapy   Patient is on Treatment Plan :  COLORECTAL Pembrolizumab q21d        CANCER STAGING:  Cancer Staging  Malignant neoplasm of colon [facility]) Staging form: Colon and Rectum, AJCC 8th Edition - Clinical stage from 04/27/2020: Stage IIIB (cT4a, cN1a, cM0) - Unsigned -  Pathologic stage from 11/05/2020: Stage IVC (rpTX, pN0, pM1c) - Signed by Derek Jack, MD on 11/05/2020   INTERVAL HISTORY:  Ms. ENYAH MOMAN, a 76 y.o. female, returns for routine follow-up and consideration for next cycle of chemotherapy. [place]ate was last seen on 03/23/2022.  Due for cycle #23 of Keytruda today.   Overall, she tells me she has been feeling pretty well. She reports a knot on her abdomen, and she denies any associated pain. She takes 1 tablet of Eliquis BID. She denies skin rash and n/v/d.   Overall, she feels ready for next cycle of chemo today.   REVIEW OF SYSTEMS:  Review of Systems  Constitutional:  Negative for appetite change and fatigue.  Respiratory:  Positive for cough.   Gastrointestinal:  Negative for diarrhea, nausea and vomiting.  Skin:  Negative for rash.  All other systems reviewed and are negative.  PAST MEDICAL/SURGICAL HISTORY:  Past Medical History:  Diagnosis Date   Arthritis    Cancer [facility])    Cataract    OU   Colon cancer ([place])    Family history of pancreatic cancer 08/21/2020   Family history of uterine cancer 08/21/2020   H/O cesarean section    Hx of tonsillectomy    Hyperlipidemia    Hypertension    Macular degeneration    Exu ARMD OU   Paroxysmal atrial fibrillation ([place]) 07/05/2020   Past Surgical History:  Procedure Laterality Date   BIOPSY  04/06/2020   Procedure: BIOPSY;  Surgeon: Rogene Houston, MD;  Location: [facility];  Service: Endoscopy;;   CARPAL TUNNEL RELEASE Right    CATARACT EXTRACTION W/PHACO Left 10/11/2019   Procedure: CATARACT EXTRACTION PHACO AND INTRAOCULAR LENS PLACEMENT ([place]);  Surgeon: Baruch Goldmann, MD;  Location: [facility] ORS;  Service: Ophthalmology;  Laterality: Left;  CDE: 8.56   CATARACT EXTRACTION W/PHACO Right 10/25/2019   Procedure: CATARACT EXTRACTION PHACO AND INTRAOCULAR LENS PLACEMENT ([facility]);  Surgeon: Baruch Goldmann, MD;  Location: [facility] ORS;  Service: Ophthalmology;  Laterality: Right;   CDE: 5.67   CESAREAN SECTION     COLONOSCOPY N/A 04/06/2020   Procedure: COLONOSCOPY;  Surgeon: Rogene Houston, MD;  Location: [facility] END[facility];  Service: Endoscopy;  Laterality: N/A;   CYSTOSCOPY WITH BIOPSY N/A 04/08/2020   Procedure: CYSTOSCOPY WITH BIOPSY;  Surgeon: Cleon Gustin, MD;  Location: [facility] ORS;  Service: Urology;  Laterality: N/A;   ESOPHAGOGASTRODUODENOSCOPY N/A 04/05/2020   Procedure: ESOPHAGOGASTRODUODENOSCOPY (EGD);  Surgeon: Rogene Houston, MD;  Location: [facility];  Service: Endoscopy;  Laterality: N/A;   PARTIAL COLECTOMY N/A 04/08/2020   Procedure: PARTIAL COLECTOMY;  Surgeon: Virl Cagey, MD;  Location: [facility] ORS;  Service: General;  Laterality: N/A;   PORTACATH PLACEMENT Left 05/18/2020   Procedure: INSERTION PORT-A-CATH (ATTACHED CATHETER IN LEFT SUBCLAVIAN);  Surgeon: Virl Cagey, MD;  Location: [facility] ORS;  Service: General;  Laterality: Left;   TONSILLECTOMY      SOCIAL HISTORY:  Social History   Socioeconomic History   Marital status: Divorced    Spouse name: Not on file   Number of children: Not on file   Years of education: Not on file   Highest education level: Not on file  Occupational History  Not on file  Tobacco Use   Smoking status: Never   Smokeless tobacco: Never  Vaping Use   Vaping Use: Never used  Substance and Sexual Activity   Alcohol use: No   Drug use: No   Sexual activity: Not Currently  Other Topics Concern   Not on file  Social History Narrative   Not on file   Social Determinants of Health   Financial Resource Strain: Low Risk    Difficulty of Paying Living Expenses: Not hard at all  Food Insecurity: No Food Insecurity   Worried About Charity fundraiser in the Last Year: Never true   [place] in the Last Year: Never true  Transportation Needs: No Transportation Needs   Lack of Transportation (Medical): No   Lack of Transportation (Non-Medical): No  Physical Activity: Sufficiently Active   Days of  Exercise per Week: 5 days   Minutes of Exercise per Session: 30 min  Stress: No Stress Concern Present   Feeling of Stress : Not at all  Social Connections: Moderately Isolated   Frequency of Communication with Friends and Family: More than three times a week   Frequency of Social Gatherings with Friends and Family: More than three times a week   Attends Religious Services: 1 to 4 times per year   Active Member of [employer] or Organizations: No   Attends Music therapist: Never   Marital Status: Divorced  Human resources officer Violence: Not At Risk   Fear of Current or Ex-Partner: No   Emotionally Abused: No   Physically Abused: No   Sexually Abused: No    FAMILY HISTORY:  Family History  Problem Relation Age of Onset   Macular degeneration Mother    Stroke Mother    Hypertension Mother    Dementia Mother    Lung cancer Father        dx late 42s; smoking hx   Diabetes Sister    Hypertension Sister    Leukemia Paternal Uncle        d. 74s   Cancer Maternal Aunt        ovarian or endometrial dx 42s   Pancreatic cancer Paternal Uncle        d. late 83s    CURRENT MEDICATIONS:  Current Outpatient Medications  Medication Sig Dispense Refill   alendronate (FOSAMAX) 70 MG tablet Take 1 tablet (70 mg total) by mouth every 7 (seven) days. Take with a full glass of water on an empty stomach. 12 tablet 3   amLODipine (NORVASC) 5 MG tablet Take 1 tablet (5 mg total) by mouth daily. 90 tablet 3   atorvastatin (LIPITOR) 20 MG tablet Take 1 tablet (20 mg total) by mouth daily. 90 tablet 3   benzonatate (TESSALON PERLES) 100 MG capsule Take 1 capsule (100 mg total) by mouth 3 (three) times daily as needed. 20 capsule 0   cholecalciferol (VITAMIN D3) 25 MCG (1000 UT) tablet Take 1,000 Units by mouth daily.     CRANBERRY FRUIT PO Take 1 tablet by mouth daily.     diclofenac Sodium (VOLTAREN) 1 % GEL APPLY 4 GRAMS TO AFFECTED AREA(S) 4 TIMES A DAY 400 g 2   dorzolamide-timolol  (COSOPT) 22.3-6.8 MG/ML ophthalmic solution INSTILL ONE DROP IN EACH EYE TWICE DAILY 10 mL 3   ELIQUIS 5 MG TABS tablet TAKE ONE TABLET TWICE DAILY 60 tablet 6   FEROSUL 325 (65 Fe) MG tablet TAKE  (1)  TABLET TWICE A  DAY WITH MEALS (BREAKFAST AND SUPPER) 027 tablet 0   Garlic 741 MG TABS Take by mouth.     lidocaine-prilocaine (EMLA) cream Apply a small amount to port a cath site and cover with plastic wrap 1 hour prior to chemotherapy appointments 30 g 0   losartan-hydrochlorothiazide (HYZAAR) 100-25 MG tablet TAKE 1 TABLET DAILY 90 tablet 3   potassium chloride SA (KLOR-CON M) 20 MEQ tablet Take 1 tablet (20 mEq total) by mouth 2 (two) times daily. 60 tablet 3   prochlorperazine (COMPAZINE) 10 MG tablet Take 1 tablet (10 mg total) by mouth every 6 (six) hours as needed for nausea or vomiting. 30 tablet 0   traMADol (ULTRAM) 50 MG tablet Take 1 tablet (50 mg total) by mouth 2 (two) times daily as needed. 30 tablet 0   No current facility-administered medications for this visit.    ALLERGIES:  Allergies  Allergen Reactions   Lisinopril Cough    PHYSICAL EXAM:  Performance status (ECOG): 1 - Symptomatic but completely ambulatory  Vitals:   05/04/22 1250  BP: (!) 189/76  Pulse: 76  Resp: 18  Temp: 98.8 F (37.1 C)  SpO2: 97%   Wt Readings from Last 3 Encounters:  05/04/22 174 lb 10.9 oz (79.2 kg)  03/23/22 172 lb 9.9 oz (78.3 kg)  03/02/22 172 lb 3.2 oz (78.1 kg)   Physical Exam Vitals reviewed.  Constitutional:      Appearance: Normal appearance. She is obese.  Cardiovascular:     Rate and Rhythm: Normal rate and regular rhythm.     Pulses: Normal pulses.     Heart sounds: Normal heart sounds.  Pulmonary:     Effort: Pulmonary effort is normal.     Breath sounds: Normal breath sounds.  Abdominal:     Hernia: A hernia is present.  Neurological:     General: No focal deficit present.     Mental Status: She is alert and oriented to person, place, and time.   Psychiatric:        Mood and Affect: Mood normal.        Behavior: Behavior normal.    LABORATORY DATA:  I have reviewed the labs as listed.     Latest Ref Rng & Units 04/13/2022   12:24 PM 03/23/2022   11:24 AM 03/02/2022   11:42 AM  CBC  WBC 4.0 - 10.5 K/uL 6.5   5.9   5.7    Hemoglobin 12.0 - 15.0 g/dL 12.9   13.5   13.3    Hematocrit 36.0 - 46.0 % 38.3   39.8   41.1    Platelets 150 - 400 K/uL 147   150   153        Latest Ref Rng & Units 04/13/2022   12:24 PM 03/23/2022   11:24 AM 03/02/2022   11:42 AM  CMP  Glucose 70 - 99 mg/dL 96   108   101    BUN 8 - 23 mg/dL _0 Creatinine 0.44 - 1.00 mg/dL 1.05   1.04   0.98    Sodium 135 - 145 mmol/L 137   137   136    Potassium 3.5 - 5.1 mmol/L 3.1   2.9   3.1    Chloride 98 - 111 mmol/L 99   97   100    CO2 22 - 32 mmol/L _1 Calcium 8.9 -  10.3 mg/dL 9.9   10.1   9.5    Total Protein 6.5 - 8.1 g/dL 7.6   8.0   8.2    Total Bilirubin 0.3 - 1.2 mg/dL 0.5   0.5   0.6    Alkaline Phos 38 - 126 U/L 70   72   71    AST 15 - 41 U/L 33   35   31    ALT 0 - 44 U/L 35   38   30      DIAGNOSTIC IMAGING:  I have independently reviewed the scans and discussed with the patient. Intravitreal Injection, Pharmacologic Agent - OS - Left Eye  Result Date: 04/14/2022 Time Out 04/14/2022. 8:34 AM. Confirmed correct patient, procedure, site, and patient consented. Anesthesia Topical anesthesia was used. Anesthetic medications included Lidocaine 2%, Proparacaine 0.5%. Procedure Preparation included 5% betadine to ocular surface, eyelid speculum. A (32g) needle was used. Injection: 2 mg aflibercept 2 MG/0.05ML   Route: Intravitreal, Site: Left Eye   NDC: [id number], Lot: [id number], Expiration date: 03/05/2023, Waste: 0 mL Post-op Post injection exam found visual acuity of at least counting fingers. The patient tolerated the procedure well. There were no complications. The patient received written and verbal post procedure care  education. Post injection medications were not given.   OCT, Retina - OU - Both Eyes  Result Date: 04/14/2022 Right Eye Quality was good. Central Foveal Thickness: 549. Progression has been stable. Findings include pigment epithelial detachment, epiretinal membrane, subretinal hyper-reflective material, retinal drusen , abnormal foveal contour, no SRF, intraretinal fluid, disciform scar, outer retinal tubulation, outer retinal atrophy (Stable sub-retinal scar, mild persistent cystic changes overlying). Left Eye Quality was good. Central Foveal Thickness: 292. Progression has been stable. Findings include subretinal hyper-reflective material, pigment epithelial detachment, intraretinal fluid, retinal drusen , outer retinal atrophy, no SRF, abnormal foveal contour (Trace persistent cystic changes overlying PED/SRHM ). Notes *Images captured and stored on drive Diagnosis / Impression: Exudative ARMD OU OD: Stable sub-retinal scar, mild persistent cystic changes overlying OS: trace persistent cystic changes overlying PED/SRHM Clinical management: See below Abbreviations: NFP - Normal foveal profile. CME - cystoid macular edema. PED - pigment epithelial detachment. IRF - intraretinal fluid. SRF - subretinal fluid. EZ - ellipsoid zone. ERM - epiretinal membrane. ORA - outer retinal atrophy. ORT - outer retinal tubulation. SRHM - subretinal hyper-reflective material     ASSESSMENT:  1.  Stage IIIb (T4AN1A) poorly differentiated right colon adenocarcinoma: -Right hemicolectomy on 04/07/2020 with poorly differentiated adenocarcinoma, pT4a, positive radial margin, 1/15 lymph nodes positive, loss of MLH1 and PMS2, bladder biopsy negative for malignancy. -CTAP on 04/06/2020 showed circumferential ascending colon mass with numerous borderline enlarged pericolonic lymph nodes.  No findings of hepatic metastatic disease. -CEA on 04/04/2020 was 12.2.  CEA improved to 1.7 on 05/13/2020. -PET scan on 05/11/2020 shows mild FDG uptake  associated with the anastomotic site.  Small pulmonary nodules that do not show elevated FDG activity, remain nonspecific, subcentimeter.  Persistent but improved bladder wall thickening. -Cycle 1 of dose reduced FOLFOX on 05/19/2020, followed by 2 hospitalizations, 1 from acute kidney injury from diarrhea and second admission for C. difficile colitis. -Cycle 2 of 5-FU and leucovorin dose reduced on 06/30/2020.  Chemotherapy discontinued secondary to intolerance. -Follow-up CT scan on 11/02/2020 with multiple soft tissue lesions in the central and right mesentery measuring up to 3.1 x 1.7 cm.  Mild lymphadenopathy in the hepatic duodenal ligament, retroperitoneal space, right common iliac chain, bilateral external iliac chains.  17 mm soft tissue nodule in the lower anterior abdominal wall.  Bilateral tiny pulmonary nodules not substantially changed.  2.9 x 2.5 cm low-density lesion along the posterior uterus is indeterminate. -Pembrolizumab started on 11/10/2020. -CT CAP from 02/09/2021 showed interval near complete resolution of previously demonstrated nodal mass in the ileocolonic mesentery.  Residual ill-defined nodule medial to the tip of the right hepatic lobe.  Stable small pulmonary nodules bilaterally consistent with benign findings.   2.  Family history: -Paternal uncle had colon cancer.  Maternal aunt had brain cancer and another maternal aunt had gynecological malignancy.  Father had lung cancer and was a smoker. - Genetic testing did not reveal any notable mutations.   3.  Diffuse erythema and nodularity of the dome of the bladder: -CT scan showed severely thickened bladder wall. -Cystoscopy on 04/08/2020 showed diffuse erythema, nodularity in the posterior wall tracking to the dome.  Ureteral orifices were in normal locations.  Biopsies were benign.   PLAN:  1.  Stage IV right colon adenocarcinoma, MSI-high: - CT CAP (02/08/2022): Stable exam without new or progressive findings to suggest  recurrence or metastatic disease. - She has a midline incisional hernia above the incision which is easily self reducing. - No immunotherapy related side effects noted. - Reviewed labs today which showed normal LFTs and CBC.  Creatinine is baseline elevated around 1.03 and stable.  TSH was 1.8.  CEA was 2.3. - Recommend proceeding with treatment today and in 3 weeks.  RTC 6 weeks for follow-up.  Plan to repeat scans in September.   2.  Paroxysmal atrial fibrillation: - Continue Eliquis indefinitely.  No bleeding issues.   3.  Hypertension: - Continue Hyzaar and amlodipine.  4.  Hypokalemia: - She is taking potassium 20 mEq twice daily.  Potassium has been consistently low. - Will increase K-Dur to 20 mEq 3 times daily.   Orders placed this encounter:  No orders of the defined types were placed in this encounter.    Derek Jack, MD [place] [phone]   I, Thana Ates, am acting as a scribe for Dr. Derek Jack.  I, Derek Jack MD, have reviewed the above documentation for accuracy and completeness, and I agree with the above.

## 2022-05-04 NOTE — Progress Notes (Signed)
Patient has been assessed, vital signs and labs have been reviewed by Dr. Katragadda. ANC, Creatinine, LFTs, and Platelets are within treatment parameters per Dr. Katragadda. The patient is good to proceed with treatment at this time. Primary RN and pharmacy aware.  

## 2022-05-04 NOTE — Patient Instructions (Signed)
[facility]  Discharge Instructions: ?Thank you for choosing [facility] to provide your oncology and hematology care.  ?If you have a lab appointment with the Cancer Center, please come in thru the Main Entrance and check in at the main information desk. ? ?Wear comfortable clothing and clothing appropriate for easy access to any Portacath or PICC line.  ? ?We strive to give you quality time with your provider. You may need to reschedule your appointment if you arrive late (15 or more minutes).  Arriving late affects you and other patients whose appointments are after yours.  Also, if you miss three or more appointments without notifying the office, you may be dismissed from the clinic at the provider?s discretion.    ?  ?For prescription refill requests, have your pharmacy contact our office and allow 72 hours for refills to be completed.   ? ?Today you received the following chemotherapy and/or immunotherapy agents Keytruda, return as scheduled.  ?  ?To help prevent nausea and vomiting after your treatment, we encourage you to take your nausea medication as directed. ? ?BELOW ARE SYMPTOMS THAT SHOULD BE REPORTED IMMEDIATELY: ?*FEVER GREATER THAN 100.4 F (38 ?C) OR HIGHER ?*CHILLS OR SWEATING ?*NAUSEA AND VOMITING THAT IS NOT CONTROLLED WITH YOUR NAUSEA MEDICATION ?*UNUSUAL SHORTNESS OF BREATH ?*UNUSUAL BRUISING OR BLEEDING ?*URINARY PROBLEMS (pain or burning when urinating, or frequent urination) ?*BOWEL PROBLEMS (unusual diarrhea, constipation, pain near the anus) ?TENDERNESS IN MOUTH AND THROAT WITH OR WITHOUT PRESENCE OF ULCERS (sore throat, sores in mouth, or a toothache) ?UNUSUAL RASH, SWELLING OR PAIN  ?UNUSUAL VAGINAL DISCHARGE OR ITCHING  ? ?Items with * indicate a potential emergency and should be followed up as soon as possible or go to the Emergency Department if any problems should occur. ? ?Please show the CHEMOTHERAPY ALERT CARD or IMMUNOTHERAPY ALERT CARD at check-in to  the Emergency Department and triage nurse. ? ?Should you have questions after your visit or need to cancel or reschedule your appointment, please contact [facility] [phone]  and follow the prompts.  Office hours are 8:00 a.m. to 4:30 p.m. Monday - Friday. Please note that voicemails left after 4:00 p.m. may not be returned until the following business day.  We are closed weekends and major holidays. You have access to a nurse at all times for urgent questions. Please call the main number to the clinic [phone] and follow the prompts. ? ?For any non-urgent questions, you may also contact your provider using MyChart. We now offer e-Visits for anyone 18 and older to request care online for non-urgent symptoms. For details visit [email or web address]. ?  ?Also download the MyChart app! Go to the app store, search "MyChart", open the app, select [facility], and log in with your MyChart username and password. ? ?Due to Covid, a mask is required upon entering the hospital/clinic. If you do not have a mask, one will be given to you upon arrival. For doctor visits, patients may have 1 support person aged 18 or older with them. For treatment visits, patients cannot have anyone with them due to current Covid guidelines and our immunocompromised population.  ?

## 2022-05-04 NOTE — Progress Notes (Signed)
Patient okay for treatment per Dr. Delton Coombes. 40 mEq of Potassium chloride ordered per standing orders. Patient tolerated therapy with no complaints voiced. Side effects with management reviewed with understanding verbalized. Port site clean and dry with no bruising or swelling noted at site. Band aid applied. Patient left in satisfactory condition with VSS and no s/s of distress noted.

## 2022-05-04 NOTE — Patient Instructions (Signed)
[place] at [facility] Discharge Instructions  You were seen and examined today by Dr. Delton Coombes.  Dr. Delton Coombes discussed your most recent lab work. You may proceed with treatment today, but you need to increase your potassium to three times daily.  Follow-up as scheduled.    Thank you for choosing [place] at [facility] to provide your oncology and hematology care.  To afford each patient quality time with our provider, please arrive at least 15 minutes before your scheduled appointment time.   If you have a lab appointment with the [facility] please come in thru the Main Entrance and check in at the main information desk.  You need to re-schedule your appointment should you arrive 10 or more minutes late.  We strive to give you quality time with our providers, and arriving late affects you and other patients whose appointments are after yours.  Also, if you no show three or more times for appointments you may be dismissed from the clinic at the providers discretion.     Again, thank you for choosing [facility].  Our hope is that these requests will decrease the amount of time that you wait before being seen by our physicians.       _____________________________________________________________  Should you have questions after your visit to [facility], please contact our office at [phone] and follow the prompts.  Our office hours are 8:00 a.m. and 4:30 p.m. Monday - Friday.  Please note that voicemails left after 4:00 p.m. may not be returned until the following business day.  We are closed weekends and major holidays.  You do have access to a nurse 24-7, just call the main number to the clinic [phone] and do not press any options, hold on the line and a nurse will answer the phone.    For prescription refill requests, have your pharmacy contact our office and allow 72 hours.    Due to Covid, you  will need to wear a mask upon entering the hospital. If you do not have a mask, a mask will be given to you at the Main Entrance upon arrival. For doctor visits, patients may have 1 support person age 70 or older with them. For treatment visits, patients can not have anyone with them due to social distancing guidelines and our immunocompromised population.

## 2022-05-05 LAB — CEA: CEA: 1.5 ng/mL (ref 0.0–4.7)

## 2022-05-13 NOTE — Progress Notes (Shared)
[facility] Note  05/26/2022    CHIEF COMPLAINT Patient presents for Retina Follow Up  HISTORY OF PRESENT ILLNESS: Gabrielle [name] is a 76 y.o. female who presents to the clinic today for:   HPI     Retina Follow Up           Diagnosis: Wet AMD   Laterality: both eyes   Severity: moderate   Duration: 6 weeks   Course: stable         Comments   Pt here for 6 wk ret f/u for exu ARMD OU. Pt states VA is the same. Experiencing some watering OU this morning.       Last edited by Kingsley Spittle, COT on 05/26/2022  8:06 AM.     Pt states   Referring physician: Janora Norlander, DO [place],  [place] [address]  HISTORICAL INFORMATION:   Selected notes from the MEDICAL RECORD NUMBER Referred by Dr. Cristela Blue for concern of ARMD OU   CURRENT MEDICATIONS: Current Outpatient Medications (Ophthalmic Drugs)  Medication Sig   dorzolamide-timolol (COSOPT) 22.3-6.8 MG/ML ophthalmic solution INSTILL ONE DROP IN [facility] EYE TWICE DAILY   No current facility-administered medications for this visit. (Ophthalmic Drugs)   Current Outpatient Medications (Other)  Medication Sig   alendronate (FOSAMAX) 70 MG tablet Take 1 tablet (70 mg total) by mouth every 7 (seven) days. Take with a full glass of water on an empty stomach.   amLODipine (NORVASC) 5 MG tablet Take 1 tablet (5 mg total) by mouth daily.   atorvastatin (LIPITOR) 20 MG tablet Take 1 tablet (20 mg total) by mouth daily.   benzonatate (TESSALON PERLES) 100 MG capsule Take 1 capsule (100 mg total) by mouth 3 (three) times daily as needed.   cholecalciferol (VITAMIN D3) 25 MCG (1000 UT) tablet Take 1,000 Units by mouth daily.   CRANBERRY FRUIT PO Take 1 tablet by mouth daily.   diclofenac Sodium (VOLTAREN) 1 % GEL APPLY 4 GRAMS TO AFFECTED AREA(S) 4 TIMES A DAY   ELIQUIS 5 MG TABS tablet TAKE ONE TABLET TWICE DAILY   FEROSUL 325 (65 Fe) MG tablet TAKE  (1)  TABLET TWICE A DAY WITH  MEALS (BREAKFAST AND SUPPER)   Garlic 510 MG TABS Take by mouth.   lidocaine-prilocaine (EMLA) cream Apply a small amount to port a cath site and cover with plastic wrap 1 hour prior to chemotherapy appointments   losartan-hydrochlorothiazide (HYZAAR) 100-25 MG tablet TAKE 1 TABLET DAILY   potassium chloride SA (KLOR-CON M) 20 MEQ tablet Take 1 tablet (20 mEq total) by mouth 3 (three) times daily.   prochlorperazine (COMPAZINE) 10 MG tablet Take 1 tablet (10 mg total) by mouth every 6 (six) hours as needed for nausea or vomiting.   traMADol (ULTRAM) 50 MG tablet Take 1 tablet (50 mg total) by mouth 2 (two) times daily as needed.   No current facility-administered medications for this visit. (Other)   REVIEW OF SYSTEMS: ROS   Positive for: Gastrointestinal, Genitourinary, Musculoskeletal, Cardiovascular, Eyes Negative for: Constitutional, Neurological, Skin, HENT, Endocrine, Respiratory, Psychiatric, Allergic/Imm, Heme/Lymph Last edited by Kingsley Spittle, COT on 05/26/2022  8:06 AM.      ALLERGIES Allergies  Allergen Reactions   Lisinopril Cough   PAST MEDICAL HISTORY Past Medical History:  Diagnosis Date   Arthritis    [facility])    Cataract    OU   Colon cancer ([place])  Family history of pancreatic cancer 08/21/2020   Family history of uterine cancer 08/21/2020   H/O cesarean section    Hx of tonsillectomy    Hyperlipidemia    Hypertension    Macular degeneration    Exu ARMD OU   Paroxysmal atrial fibrillation ([place]) 07/05/2020   Past Surgical History:  Procedure Laterality Date   BIOPSY  04/06/2020   Procedure: BIOPSY;  Surgeon: Rogene Houston, MD;  Location: [facility] END[facility] SU[facility];  Service: Endoscopy;;   CARPAL TUNNEL RELEASE Right    CATARACT EXTRACTION W/PHACO Left 10/11/2019   Procedure: CATARACT EXTRACTION PHACO AND INTRAOCULAR LENS PLACEMENT ([place]);  Surgeon: Baruch Goldmann, MD;  Location: [facility] ORS;  Service: Ophthalmology;  Laterality: Left;  CDE: 8.56   CATARACT  EXTRACTION W/PHACO Right 10/25/2019   Procedure: CATARACT EXTRACTION PHACO AND INTRAOCULAR LENS PLACEMENT (IOC);  Surgeon: Baruch Goldmann, MD;  Location: [facility] ORS;  Service: Ophthalmology;  Laterality: Right;  CDE: 5.67   CESAREAN SECTION     COLONOSCOPY N/A 04/06/2020   Procedure: COLONOSCOPY;  Surgeon: Rogene Houston, MD;  Location: [facility] ENDO SUITE;  Service: Endoscopy;  Laterality: N/A;   CYSTOSCOPY WITH BIOPSY N/A 04/08/2020   Procedure: CYSTOSCOPY WITH BIOPSY;  Surgeon: Cleon Gustin, MD;  Location: [facility] ORS;  Service: Urology;  Laterality: N/A;   ESOPHAGOGASTRODUODENOSCOPY N/A 04/05/2020   Procedure: ESOPHAGOGASTRODUODENOSCOPY (EGD);  Surgeon: Rogene Houston, MD;  Location: [facility];  Service: Endoscopy;  Laterality: N/A;   PARTIAL COLECTOMY N/A 04/08/2020   Procedure: PARTIAL COLECTOMY;  Surgeon: Virl Cagey, MD;  Location: [facility] ORS;  Service: General;  Laterality: N/A;   PORTACATH PLACEMENT Left 05/18/2020   Procedure: INSERTION PORT-A-CATH (ATTACHED CATHETER IN LEFT SUBCLAVIAN);  Surgeon: Virl Cagey, MD;  Location: [facility] ORS;  Service: General;  Laterality: Left;   TONSILLECTOMY     FAMILY HISTORY Family History  Problem Relation Age of Onset   Macular degeneration Mother    Stroke Mother    Hypertension Mother    Dementia Mother    Lung cancer Father        dx late 9s; smoking hx   Diabetes Sister    Hypertension Sister    Leukemia Paternal Uncle        d. 60s   Cancer Maternal Aunt        ovarian or endometrial dx 60s   Pancreatic cancer Paternal Uncle        d. late 50s   SOCIAL HISTORY Social History   Tobacco Use   Smoking status: Never   Smokeless tobacco: Never  Vaping Use   Vaping Use: Never used  Substance Use Topics   Alcohol use: No   Drug use: No       OPHTHALMIC EXAM: Base Eye Exam     Visual Acuity (Snellen - Linear)       Right Left   Dist cc CF at 1' 20/70 -1   Dist ph cc NI NI    Correction: Glasses         Tonometry  (Tonopen, 8:11 AM)       Right Left   Pressure 20 19         Pupils       Dark Light Shape React APD   Right 3 2 Round Brisk None   Left 3 2 Round Brisk None         Visual Fields (Counting fingers)       Left Right    Full  Full         Extraocular Movement       Right Left    Full, Ortho Full, Ortho         Neuro/Psych     Oriented x3: Yes   Mood/Affect: Normal         Dilation     Both eyes: 1.0% Mydriacyl, 2.5% Phenylephrine @ 8:12 AM           Refraction     Wearing Rx       Sphere Cylinder Add   Right [place] Sphere +3.50   Left -0.50 Sphere +3.50    Type: PAL           IMAGING AND PROCEDURES  Imaging and Procedures for 03/21/18          ASSESSMENT/PLAN:    ICD-10-CM   1. Exudative age-related macular degeneration of both eyes with active choroidal neovascularization ([place])  H35.3231 OCT, Retina - OU - Both Eyes    2. Posterior vitreous detachment of both eyes  H43.813     3. Pseudophakia of both eyes  Z96.1     4. Ocular hypertension, bilateral  H40.053     5. Dry eyes  H04.123      1. Exudative age related macular degeneration, both eyes.    - severe exudative disease with very active CNVM OU at presentation in January 2019  - S/P IVA OD #1 (01.04.19), #2 (02.15.19)  - S/P IVA OS #1 (01.18.19), #2 (02.15.19)  - switched to Eylea 3.18.19 due to severity of disease  - S/P IVE OD #1 (03.18.19), #2 (04.17.19), #3 (05.15.19), #4 (10.02.19),  #5 (01.15.20), #6 (06.04.20), #7 (10.29.20), #8 (01.21.21), #9 (05.27.21), #10 (07.22.21) -- injections held due to stable disciform scar  - S/P IVE OS #1 (03.18.19), #2 (04.17.19), #3 (05.15.19), #4 (06.12.19), #5 (07.10.19), #6 (08.07.19), #7 (09.04.19), #8 (10.02.19), #9 (11.06.19), #10 (12.11.19), #11 (01.15.20), #12 (02.20.20), #13 (03.26.20), #14 (04.30.20), #15 (06.04.20), #16 (07.16.20), # 17 (08.20.20), #18 (09.24.20), #19 (10.29.20), #20 (12.03.20), #21 (01.21.21), #22  (03.18.21), #23 (05.27.21), #24 (07.22.21), #25 (11.11.21), #26 (12.09.21), #27 (01.06.22), #28 (2.7.22), #29 (03.24.22), #30 (4.28.22), #31 (05.31.22), #32 (06.30.22), #33 (08.04.22), #34 (09.15.22), #35 (10.27.22), #36 (12.08.22), #37 (01.12.23), #38 (02.16.23), #39 (03.30.23), #40 (05.11.23)  - IVE OS held from 7.22.21 to 11.11.21 due to TIA/stroke on 8.1.21  - OCT OS: trace persistent cystic changes overlying PED/SRHM at 6 weeks  - exam OS shows faint IRH nasal fovea stably resolved, no heme  - OD: Stable sub-retinal scar, mild persistent cystic changes overlying  - BCVA OD stable at CF 3'; OS 20/70 -- stable   - recommend IVE OS #41 today, 06.22.23 w/ f/u 5 wks - will cont to hold OD  - Eylea informed consent form signed and scanned on 01.21.2021  - 2022 Good Days approved 12/05/2020 - 12/04/2022  - f/u in 5 weeks, sooner prn for DFE/OCT/possible injection(s)  2. PVD / vitreous syneresis  - Discussed findings and prognosis   - No RT or RD on 360 exam  - Reviewed s/s of RT/RD  - Strict return precautions for any such RT/RD signs/symptoms   3. Pseudophakia OU  - s/p CE/IOL (Dr. Marisa Hua, 11.2020)  - beautiful surgeries w/ IOLs in excellent position, doing well  - monitor   4. Ocular Hypertension OU  - IOP 20,19  - cont Cosopt BID OU  5. Dry eyes OU - recommend artificial tears and lubricating ointment as needed  Ophthalmic Meds  Ordered this visit:  No orders of the defined types were placed in this encounter.    No follow-ups on file.  There are no Patient Instructions on file for this visit.  This document serves as a record of services personally performed by Gardiner Sleeper, MD, PhD. It was created on their behalf by San Jetty. Owens Shark, OA an ophthalmic technician. The creation of this record is the provider's dictation and/or activities during the visit.    Electronically signed by: San Jetty. Owens Shark, [place] 06.09.2023 8:26 AM   Gardiner Sleeper, M.D., Ph.D. Diseases & Surgery  of the Retina and Vitreous [facility] [place]: M myopia (nearsighted); A astigmatism; H hyperopia (farsighted); P presbyopia; Mrx spectacle prescription;  CTL contact lenses; OD right eye; OS left eye; OU both eyes  XT exotropia; ET esotropia; PEK punctate epithelial keratitis; PEE punctate epithelial erosions; DES dry eye syndrome; MGD meibomian gland dysfunction; ATs artificial tears; PFAT's preservative free artificial tears; [place] nuclear sclerotic cataract; PSC posterior subcapsular cataract; ERM epi-retinal membrane; PVD posterior vitreous detachment; RD retinal detachment; DM diabetes mellitus; DR diabetic retinopathy; NPDR non-proliferative diabetic retinopathy; PDR proliferative diabetic retinopathy; CSME clinically significant macular edema; DME diabetic macular edema; dbh dot blot hemorrhages; CWS cotton wool spot; POAG primary open angle glaucoma; C/D cup-to-disc ratio; HVF humphrey visual field; GVF goldmann visual field; OCT optical coherence tomography; IOP intraocular pressure; BRVO Branch retinal vein occlusion; CRVO central retinal vein occlusion; CRAO central retinal artery occlusion; BRAO branch retinal artery occlusion; RT retinal tear; SB scleral buckle; PPV pars plana vitrectomy; VH Vitreous hemorrhage; PRP panretinal laser photocoagulation; IVK intravitreal kenalog; VMT vitreomacular traction; MH Macular hole;  NVD neovascularization of the disc; NVE neovascularization elsewhere; AREDS age related eye disease study; ARMD age related macular degeneration; POAG primary open angle glaucoma; EBMD epithelial/anterior basement membrane dystrophy; ACIOL anterior chamber intraocular lens; IOL intraocular lens; PCIOL posterior chamber intraocular lens; Phaco/IOL phacoemulsification with intraocular lens placement; [place] photorefractive keratectomy; LASIK laser assisted in situ keratomileusis; HTN hypertension; DM diabetes mellitus; COPD chronic obstructive  pulmonary disease

## 2022-05-14 ENCOUNTER — Other Ambulatory Visit (INDEPENDENT_AMBULATORY_CARE_PROVIDER_SITE_OTHER): Payer: Self-pay | Admitting: Ophthalmology

## 2022-05-25 ENCOUNTER — Inpatient Hospital Stay (HOSPITAL_COMMUNITY): Payer: Medicare HMO

## 2022-05-25 ENCOUNTER — Inpatient Hospital Stay (HOSPITAL_COMMUNITY): Payer: Medicare HMO | Attending: Hematology

## 2022-05-25 VITALS — BP 177/77 | HR 71 | Temp 97.8°F | Resp 18

## 2022-05-25 DIAGNOSIS — Z79899 Other long term (current) drug therapy: Secondary | ICD-10-CM | POA: Insufficient documentation

## 2022-05-25 DIAGNOSIS — C779 Secondary and unspecified malignant neoplasm of lymph node, unspecified: Secondary | ICD-10-CM | POA: Diagnosis not present

## 2022-05-25 DIAGNOSIS — Z5112 Encounter for antineoplastic immunotherapy: Secondary | ICD-10-CM | POA: Diagnosis not present

## 2022-05-25 DIAGNOSIS — C182 Malignant neoplasm of ascending colon: Secondary | ICD-10-CM | POA: Insufficient documentation

## 2022-05-25 DIAGNOSIS — E876 Hypokalemia: Secondary | ICD-10-CM

## 2022-05-25 DIAGNOSIS — C189 Malignant neoplasm of colon, unspecified: Secondary | ICD-10-CM

## 2022-05-25 LAB — CBC WITH DIFFERENTIAL/PLATELET
Abs Immature Granulocytes: 0.02 10*3/uL (ref 0.00–0.07)
Basophils Absolute: 0 10*3/uL (ref 0.0–0.1)
Basophils Relative: 1 %
Eosinophils Absolute: 0.2 10*3/uL (ref 0.0–0.5)
Eosinophils Relative: 4 %
HCT: 39 % (ref 36.0–46.0)
Hemoglobin: 13.2 g/dL (ref 12.0–15.0)
Immature Granulocytes: 0 %
Lymphocytes Relative: 24 %
Lymphs Abs: 1.3 10*3/uL (ref 0.7–4.0)
MCH: 31.4 pg (ref 26.0–34.0)
MCHC: 33.8 g/dL (ref 30.0–36.0)
MCV: 92.6 fL (ref 80.0–100.0)
Monocytes Absolute: 0.5 10*3/uL (ref 0.1–1.0)
Monocytes Relative: 10 %
Neutro Abs: 3.2 10*3/uL (ref 1.7–7.7)
Neutrophils Relative %: 61 %
Platelets: 125 10*3/uL — ABNORMAL LOW (ref 150–400)
RBC: 4.21 MIL/uL (ref 3.87–5.11)
RDW: 13.5 % (ref 11.5–15.5)
WBC: 5.3 10*3/uL (ref 4.0–10.5)
nRBC: 0 % (ref 0.0–0.2)

## 2022-05-25 LAB — COMPREHENSIVE METABOLIC PANEL
ALT: 47 U/L — ABNORMAL HIGH (ref 0–44)
AST: 43 U/L — ABNORMAL HIGH (ref 15–41)
Albumin: 4.1 g/dL (ref 3.5–5.0)
Alkaline Phosphatase: 71 U/L (ref 38–126)
Anion gap: 8 (ref 5–15)
BUN: 17 mg/dL (ref 8–23)
CO2: 30 mmol/L (ref 22–32)
Calcium: 9.5 mg/dL (ref 8.9–10.3)
Chloride: 97 mmol/L — ABNORMAL LOW (ref 98–111)
Creatinine, Ser: 0.91 mg/dL (ref 0.44–1.00)
GFR, Estimated: >60 mL/min (ref >60)
Glucose, Bld: 118 mg/dL — ABNORMAL HIGH (ref 70–99)
Potassium: 3.1 mmol/L — ABNORMAL LOW (ref 3.5–5.1)
Sodium: 135 mmol/L (ref 135–145)
Total Bilirubin: 0.6 mg/dL (ref 0.3–1.2)
Total Protein: 7.5 g/dL (ref 6.5–8.1)

## 2022-05-25 LAB — MAGNESIUM: Magnesium: 2 mg/dL (ref 1.7–2.4)

## 2022-05-25 LAB — TSH: TSH: 1.815 u[IU]/mL (ref 0.350–4.500)

## 2022-05-25 MED ORDER — POTASSIUM CHLORIDE CRYS ER 20 MEQ PO TBCR
40.0000 meq | EXTENDED_RELEASE_TABLET | Freq: Once | ORAL | Status: AC
  Administered 2022-05-25: 40 meq via ORAL
  Filled 2022-05-25: qty 2

## 2022-05-25 MED ORDER — SODIUM CHLORIDE 0.9 % IV SOLN
200.0000 mg | Freq: Once | INTRAVENOUS | Status: AC
  Administered 2022-05-25: 200 mg via INTRAVENOUS
  Filled 2022-05-25: qty 8

## 2022-05-25 MED ORDER — HEPARIN SOD (PORK) LOCK FLUSH 100 UNIT/ML IV SOLN
500.0000 [IU] | Freq: Once | INTRAVENOUS | Status: AC | PRN
  Administered 2022-05-25: 500 [IU]

## 2022-05-25 MED ORDER — SODIUM CHLORIDE 0.9% FLUSH
10.0000 mL | INTRAVENOUS | Status: DC | PRN
  Administered 2022-05-25: 10 mL

## 2022-05-25 MED ORDER — SODIUM CHLORIDE 0.9 % IV SOLN: Freq: Once | INTRAVENOUS | Status: AC

## 2022-05-25 NOTE — Progress Notes (Signed)
Patient presents today for Keytruda infusion.  Patient is in satisfactory condition with no complaints voiced.  Vital signs are stable.  We will proceed with treatment per MD orders.   Potassium today is 3.1.  MD made aware. Patient will take [place] Con 40 mEq x one dose today per MD.   Patient tolerated treatment well with no complaints voiced.  Patient left ambulatory in stable condition.  Vital signs stable at discharge.  Follow up as scheduled.

## 2022-05-25 NOTE — Patient Instructions (Signed)
[facility]  Discharge Instructions: Thank you for choosing [facility] to provide your oncology and hematology care.  If you have a lab appointment with the Cancer Center, please come in thru the Main Entrance and check in at the main information desk.  Wear comfortable clothing and clothing appropriate for easy access to any Portacath or PICC line.   We strive to give you quality time with your provider. You may need to reschedule your appointment if you arrive late (15 or more minutes).  Arriving late affects you and other patients whose appointments are after yours.  Also, if you miss three or more appointments without notifying the office, you may be dismissed from the clinic at the provider's discretion.      For prescription refill requests, have your pharmacy contact our office and allow 72 hours for refills to be completed.    Today you received the following chemotherapy and/or immunotherapy agents Keytruda.   Pembrolizumab injection What is this medication? PEMBROLIZUMAB (pem broe liz ue mab) is a monoclonal antibody. It is used to treat certain types of cancer. This medicine may be used for other purposes; ask your health care provider or pharmacist if you have questions. COMMON BRAND NAME(S): Keytruda What should I tell my care team before I take this medication? They need to know if you have any of these conditions: autoimmune diseases like Crohn's disease, ulcerative colitis, or lupus have had or planning to have an allogeneic stem cell transplant (uses someone else's stem cells) history of organ transplant history of chest radiation nervous system problems like myasthenia gravis or Guillain-Barre syndrome an unusual or allergic reaction to pembrolizumab, other medicines, foods, dyes, or preservatives pregnant or trying to get pregnant breast-feeding How should I use this medication? This medicine is for infusion into a vein. It is given by a  health care professional in a hospital or clinic setting. A special MedGuide will be given to you before each treatment. Be sure to read this information carefully each time. Talk to your pediatrician regarding the use of this medicine in children. While this drug may be prescribed for children as young as 6 months for selected conditions, precautions do apply. Overdosage: If you think you have taken too much of this medicine contact a poison control center or emergency room at once. NOTE: This medicine is only for you. Do not share this medicine with others. What if I miss a dose? It is important not to miss your dose. Call your doctor or health care professional if you are unable to keep an appointment. What may interact with this medication? Interactions have not been studied. This list may not describe all possible interactions. Give your health care provider a list of all the medicines, herbs, non-prescription drugs, or dietary supplements you use. Also tell them if you smoke, drink alcohol, or use illegal drugs. Some items may interact with your medicine. What should I watch for while using this medication? Your condition will be monitored carefully while you are receiving this medicine. You may need blood work done while you are taking this medicine. Do not become pregnant while taking this medicine or for 4 months after stopping it. Women should inform their doctor if they wish to become pregnant or think they might be pregnant. There is a potential for serious side effects to an unborn child. Talk to your health care professional or pharmacist for more information. Do not breast-feed an infant while taking this medicine or   for 4 months after the last dose. What side effects may I notice from receiving this medication? Side effects that you should report to your doctor or health care professional as soon as possible: allergic reactions like skin rash, itching or hives, swelling of the face,  lips, or tongue bloody or black, tarry breathing problems changes in vision chest pain chills confusion constipation cough diarrhea dizziness or feeling faint or lightheaded fast or irregular heartbeat fever flushing joint pain low blood counts - this medicine may decrease the number of white blood cells, red blood cells and platelets. You may be at increased risk for infections and bleeding. muscle pain muscle weakness pain, tingling, numbness in the hands or feet persistent headache redness, blistering, peeling or loosening of the skin, including inside the mouth signs and symptoms of high blood sugar such as dizziness; dry mouth; dry skin; fruity breath; nausea; stomach pain; increased hunger or thirst; increased urination signs and symptoms of kidney injury like trouble passing urine or change in the amount of urine signs and symptoms of liver injury like dark urine, light-colored stools, loss of appetite, nausea, right upper belly pain, yellowing of the eyes or skin sweating swollen lymph nodes weight loss Side effects that usually do not require medical attention (report to your doctor or health care professional if they continue or are bothersome): decreased appetite hair loss tiredness This list may not describe all possible side effects. Call your doctor for medical advice about side effects. You may report side effects to FDA at 1-800-FDA-1088. Where should I keep my medication? This drug is given in a hospital or clinic and will not be stored at home. NOTE: This sheet is a summary. It may not cover all possible information. If you have questions about this medicine, talk to your doctor, pharmacist, or health care provider.  2023 Elsevier/Gold Standard (2021-10-22 00:00:00)    To help prevent nausea and vomiting after your treatment, we encourage you to take your nausea medication as directed.  BELOW ARE SYMPTOMS THAT SHOULD BE REPORTED IMMEDIATELY: *FEVER GREATER  THAN 100.4 F (38 C) OR HIGHER *CHILLS OR SWEATING *NAUSEA AND VOMITING THAT IS NOT CONTROLLED WITH YOUR NAUSEA MEDICATION *UNUSUAL SHORTNESS OF BREATH *UNUSUAL BRUISING OR BLEEDING *URINARY PROBLEMS (pain or burning when urinating, or frequent urination) *BOWEL PROBLEMS (unusual diarrhea, constipation, pain near the anus) TENDERNESS IN MOUTH AND THROAT WITH OR WITHOUT PRESENCE OF ULCERS (sore throat, sores in mouth, or a toothache) UNUSUAL RASH, SWELLING OR PAIN  UNUSUAL VAGINAL DISCHARGE OR ITCHING   Items with * indicate a potential emergency and should be followed up as soon as possible or go to the Emergency Department if any problems should occur.  Please show the CHEMOTHERAPY ALERT CARD or IMMUNOTHERAPY ALERT CARD at check-in to the Emergency Department and triage nurse.  Should you have questions after your visit or need to cancel or reschedule your appointment, please contact [facility] [phone]  and follow the prompts.  Office hours are 8:00 a.m. to 4:30 p.m. Monday - Friday. Please note that voicemails left after 4:00 p.m. may not be returned until the following business day.  We are closed weekends and major holidays. You have access to a nurse at all times for urgent questions. Please call the main number to the clinic [phone] and follow the prompts.  For any non-urgent questions, you may also contact your provider using MyChart. We now offer e-Visits for anyone 18 and older to request care online for non-urgent symptoms.   For details visit [email or web address].   Also download the MyChart app! Go to the app store, search "MyChart", open the app, select [place], and log in with your MyChart username and password.  Masks are optional in the cancer centers. If you would like for your care team to wear a mask while they are taking care of you, please let them know. For doctor visits, patients may have with them one support person who is at least 76 years  old. At this time, visitors are not allowed in the infusion area.  

## 2022-05-26 ENCOUNTER — Encounter (INDEPENDENT_AMBULATORY_CARE_PROVIDER_SITE_OTHER): Payer: Self-pay | Admitting: Ophthalmology

## 2022-05-26 ENCOUNTER — Ambulatory Visit (INDEPENDENT_AMBULATORY_CARE_PROVIDER_SITE_OTHER): Payer: Medicare HMO | Admitting: Ophthalmology

## 2022-05-26 DIAGNOSIS — H43813 Vitreous degeneration, bilateral: Secondary | ICD-10-CM | POA: Diagnosis not present

## 2022-05-26 DIAGNOSIS — H40053 Ocular hypertension, bilateral: Secondary | ICD-10-CM

## 2022-05-26 DIAGNOSIS — Z961 Presence of intraocular lens: Secondary | ICD-10-CM | POA: Diagnosis not present

## 2022-05-26 DIAGNOSIS — H353231 Exudative age-related macular degeneration, bilateral, with active choroidal neovascularization: Secondary | ICD-10-CM | POA: Diagnosis not present

## 2022-05-26 DIAGNOSIS — H04123 Dry eye syndrome of bilateral lacrimal glands: Secondary | ICD-10-CM

## 2022-05-27 ENCOUNTER — Encounter (INDEPENDENT_AMBULATORY_CARE_PROVIDER_SITE_OTHER): Payer: Self-pay | Admitting: Ophthalmology

## 2022-05-27 LAB — CEA: CEA: 1.9 ng/mL (ref 0.0–4.7)

## 2022-05-27 MED ORDER — AFLIBERCEPT 2MG/0.05ML IZ SOLN FOR KALEIDOSCOPE
2.0000 mg | INTRAVITREAL | Status: AC | PRN
  Administered 2022-05-27: 2 mg via INTRAVITREAL

## 2022-06-03 ENCOUNTER — Ambulatory Visit (INDEPENDENT_AMBULATORY_CARE_PROVIDER_SITE_OTHER): Payer: Medicare HMO | Admitting: Family Medicine

## 2022-06-03 ENCOUNTER — Encounter: Payer: Self-pay | Admitting: Family Medicine

## 2022-06-03 VITALS — BP 143/83 | HR 78 | Temp 97.2°F | Ht 62.21 in | Wt 175.4 lb

## 2022-06-03 DIAGNOSIS — E782 Mixed hyperlipidemia: Secondary | ICD-10-CM | POA: Diagnosis not present

## 2022-06-03 DIAGNOSIS — M8580 Other specified disorders of bone density and structure, unspecified site: Secondary | ICD-10-CM

## 2022-06-03 DIAGNOSIS — R748 Abnormal levels of other serum enzymes: Secondary | ICD-10-CM | POA: Diagnosis not present

## 2022-06-03 DIAGNOSIS — I129 Hypertensive chronic kidney disease with stage 1 through stage 4 chronic kidney disease, or unspecified chronic kidney disease: Secondary | ICD-10-CM | POA: Diagnosis not present

## 2022-06-03 DIAGNOSIS — I10 Essential (primary) hypertension: Secondary | ICD-10-CM | POA: Diagnosis not present

## 2022-06-03 DIAGNOSIS — I480 Paroxysmal atrial fibrillation: Secondary | ICD-10-CM | POA: Diagnosis not present

## 2022-06-03 DIAGNOSIS — N183 Chronic kidney disease, stage 3 unspecified: Secondary | ICD-10-CM

## 2022-06-03 MED ORDER — ATORVASTATIN CALCIUM 20 MG PO TABS: 20.0000 mg | ORAL_TABLET | Freq: Every day | ORAL | 3 refills | Status: DC

## 2022-06-03 MED ORDER — AMLODIPINE BESYLATE 10 MG PO TABS: 10.0000 mg | ORAL_TABLET | Freq: Every day | ORAL | 3 refills | Status: DC

## 2022-06-03 NOTE — Progress Notes (Signed)
Subjective: CC: 67-monthfollow-up PCP: GJanora Norlander DO HPZW:CHENIDK SComesis a 76y.o. female presenting to clinic today for:  1.  Osteopenia with high risk of fracture Patient is compliant with Fosamax.  She does not report any bony pain.  2. Colon Cancer/hernia She continues to get infusions roughly every 3 weeks.  Last infusion was on 05/26/2022.  Her next visit will be with Dr. And they will plan for a CAT scan soon to see how she is responding to treatment.  Overall she feels pretty darn good.  She denies any excessive fatigue, decreased appetite or other complications.  She does note a hernia at the apex of the scar on her abdomen and wanted to make mention of this.  She denies that this is changed any of her bowel habits.  She notes that it does go back inside if she pushes on it.  No reports of nausea, vomiting, abdominal pain or changes in bowel habits.  3.  Hypertension Compliant with her blood pressure medication but is not sure if she is taking her cholesterol medication even that was prescribed last August.  Would like to get that renewed.  No chest pain or shortness of breath reported   ROS: Per HPI  Allergies  Allergen Reactions   Lisinopril Cough   Past Medical History:  Diagnosis Date   Arthritis    Cancer ([facility]    Cataract    OU   Colon cancer ([facility]    Family history of pancreatic cancer 08/21/2020   Family history of uterine cancer 08/21/2020   H/O cesarean section    Hx of tonsillectomy    Hyperlipidemia    Hypertension    Macular degeneration    Exu [facility] OU   Paroxysmal atrial fibrillation ([facility] 07/05/2020    Current Outpatient Medications:    alendronate (FOSAMAX) 70 MG tablet, Take 1 tablet (70 mg total) by mouth every 7 (seven) days. Take with a full glass of water on an empty stomach., Disp: 12 tablet, Rfl: 3   amLODipine (NORVASC) 5 MG tablet, Take 1 tablet (5 mg total) by mouth daily., Disp: 90 tablet, Rfl: 3   atorvastatin (LIPITOR) 20  MG tablet, Take 1 tablet (20 mg total) by mouth daily., Disp: 90 tablet, Rfl: 3   cholecalciferol (VITAMIN D3) 25 MCG (1000 UT) tablet, Take 1,000 Units by mouth daily., Disp: , Rfl:    CRANBERRY FRUIT PO, Take 1 tablet by mouth daily., Disp: , Rfl:    diclofenac Sodium (VOLTAREN) 1 % GEL, APPLY 4 GRAMS TO AFFECTED AREA(S) 4 TIMES A DAY, Disp: 400 g, Rfl: 2   dorzolamide-timolol (COSOPT) 22.3-6.8 MG/ML ophthalmic solution, INSTILL ONE DROP IN EACH EYE TWICE DAILY, Disp: 10 mL, Rfl: 3   ELIQUIS 5 MG TABS tablet, TAKE ONE TABLET TWICE DAILY, Disp: 60 tablet, Rfl: 6   FEROSUL 325 (65 Fe) MG tablet, TAKE  (1)  TABLET TWICE A DAY WITH MEALS (BREAKFAST AND SUPPER), Disp: 180 tablet, Rfl: 0   Garlic 1782MG TABS, Take by mouth., Disp: , Rfl:    lidocaine-prilocaine (EMLA) cream, Apply a small amount to port a cath site and cover with plastic wrap 1 hour prior to chemotherapy appointments, Disp: 30 g, Rfl: 0   losartan-hydrochlorothiazide (HYZAAR) 100-25 MG tablet, TAKE 1 TABLET DAILY, Disp: 90 tablet, Rfl: 3   potassium chloride SA (KLOR-CON M) 20 MEQ tablet, Take 1 tablet (20 mEq total) by mouth 3 (three) times daily., Disp: 90 tablet, Rfl:  3   prochlorperazine (COMPAZINE) 10 MG tablet, Take 1 tablet (10 mg total) by mouth every 6 (six) hours as needed for nausea or vomiting., Disp: 30 tablet, Rfl: 0 Social History   Socioeconomic History   Marital status: Divorced    Spouse name: Not on file   Number of children: Not on file   Years of education: Not on file   Highest education level: Not on file  Occupational History   Not on file  Tobacco Use   Smoking status: Never   Smokeless tobacco: Never  Vaping Use   Vaping Use: Never used  Substance and Sexual Activity   Alcohol use: No   Drug use: No   Sexual activity: Not Currently  Other Topics Concern   Not on file  Social History Narrative   Not on file   Social Determinants of Health   Financial Resource Strain: Low Risk  (02/21/2022)    Overall Financial Resource Strain (CARDIA)    Difficulty of Paying Living Expenses: Not hard at all  Food Insecurity: No Food Insecurity (02/21/2022)   Hunger Vital Sign    Worried About Running Out of Food in the Last Year: Never true    Ran Out of Food in the Last Year: Never true  Transportation Needs: No Transportation Needs (02/21/2022)   PRAPARE - Hydrologist (Medical): No    Lack of Transportation (Non-Medical): No  Physical Activity: Sufficiently Active (02/21/2022)   Exercise Vital Sign    Days of Exercise per Week: 5 days    Minutes of Exercise per Session: 30 min  Stress: No Stress Concern Present (02/21/2022)   [place]    Feeling of Stress : Not at all  Social Connections: Moderately Isolated (02/21/2022)   Social Connection and Isolation Panel [NHANES]    Frequency of Communication with Friends and Family: More than three times a week    Frequency of Social Gatherings with Friends and Family: More than three times a week    Attends Religious Services: 1 to 4 times per year    Active Member of [employer] or Organizations: No    Attends Archivist Meetings: Never    Marital Status: Divorced  Human resources officer Violence: Not At Risk (02/21/2022)   Humiliation, Afraid, Rape, and Kick questionnaire    Fear of Current or Ex-Partner: No    Emotionally Abused: No    Physically Abused: No    Sexually Abused: No   Family History  Problem Relation Age of Onset   Macular degeneration Mother    Stroke Mother    Hypertension Mother    Dementia Mother    Lung cancer Father        dx late 12s; smoking hx   Diabetes Sister    Hypertension Sister    Leukemia Paternal Uncle        d. 15s   Cancer Maternal Aunt        ovarian or endometrial dx 61s   Pancreatic cancer Paternal Uncle        d. late 75s    Objective: Office vital signs reviewed. BP (!) 143/83   Pulse 78   Temp  (!) 97.2 F (36.2 C)   Ht 5' 2.21" (1.58 m)   Wt 175 lb 6.4 oz (79.6 kg)   SpO2 97%   BMI 31.86 kg/m   Physical Examination:  General: Awake, alert, well nourished, No acute distress HEENT:  Sclera white.  Moist mucous membranes Cardio: regular rate and rhythm, S1S2 heard, no murmurs appreciated Pulm: clear to auscultation bilaterally, no wheezes, rhonchi or rales; normal work of breathing on room air Extremities: warm, well perfused, No edema, cyanosis or clubbing; +2 pulses bilaterally MSK: Ambulating independently Neuro: No tremor  Assessment/ Plan: 76 y.o. female   Stage 3 chronic kidney disease due to benign hypertension (HCC) - Plan: CMP14+EGFR  Essential hypertension - Plan: CMP14+EGFR, amLODipine (NORVASC) 10 MG tablet  Elevated liver enzymes - Plan: CMP14+EGFR  Paroxysmal atrial fibrillation ([place]) - Plan: CMP14+EGFR  Osteopenia with high risk of fracture - Plan: CMP14+EGFR, VITAMIN D 25 Hydroxy (Vit-D Deficiency, Fractures)  Mixed hyperlipidemia - Plan: atorvastatin (LIPITOR) 20 MG tablet  Check renal function, electrolytes  Blood pressure under much better control upon recheck but still needs tighter control given renal disease.  Amlodipine advanced to 10 mg daily.  Monitor blood pressures closely at home.  Goal less than 130/90.  Noted to have mildly elevated liver enzymes.  Check LFTs.  Check vitamin D, calcium level and renal function given use of bisphosphonate for osteopenia with high risk of fracture  Lipitor renewed.  Not yet due for fasting lipid  No orders of the defined types were placed in this encounter.  No orders of the defined types were placed in this encounter.    Janora Norlander, DO [place] [phone]

## 2022-06-03 NOTE — Patient Instructions (Signed)
Increase Amlodipine to '10mg'$  daily. New prescription sent to pharmacy with new pill but you are welcome to use up your old bottle first (can just take 2 of the '5mg'$  to equal new dose daily until you pick up with new pill) Atorvastatin sent to pharmacy. This is your cholesterol medication. Plan to fast next visit so we can check your cholesterol. Hernia, Adult     A hernia happens when an organ or tissue inside your body pushes out through a weak spot in the muscles of your belly (abdomen). This makes a bulge. The bulge may be: In a scar from a surgery that was done in your belly (incisional hernia). **This is the kind you have Near your belly button (umbilical hernia). In your groin (inguinal hernia). Your groin is the area where your leg meets your lower belly. If you are a female, this type could also be in your scrotum. In your upper thigh (femoral hernia). Inside your belly (hiatal hernia). This happens when your stomach slides above the muscle between your belly and your chest (diaphragm). What are the causes? This condition may be caused by: Lifting heavy things. Coughing over a long period of time. Having trouble pooping (constipation). Trouble pooping can lead to straining. A cut from surgery in your belly. A physical problem that is present at birth. Being very overweight. Smoking. Too much fluid in your belly. A testicle that has not moved down into the scrotum, in males. What are the signs or symptoms? The main symptom is a bulge in the area of the hernia, but a bulge may not always be seen. It may grow bigger or be easier to see when you cough or strain (such as when lifting something heavy). A hernia that can be pushed back into the belly rarely causes pain. A hernia that cannot be pushed back into the belly may lose its blood supply. This may cause: Pain. Fever. A feeling like you may vomit, and vomiting. Swelling. Trouble pooping. How is this treated? A hernia that is  small and painless may not need to be treated. A hernia that is large or painful may be treated with surgery. Surgery to treat a hernia involves pushing the bulge back into place and repairing the weak area of the muscle or belly. Follow these instructions at home: Activity Avoid straining the muscles near your hernia. This can happen when you: Lift something heavy. Poop (have a bowel movement). Do not lift anything that is heavier than 10 lb (4.5 kg), or the limit that you are told. When you lift something heavy, use your leg muscles. Do not use your back muscles to lift. Prevent trouble pooping If told by your doctor, take steps to prevent trouble pooping. You may need to: Drink enough fluid to keep your pee (urine) pale yellow. Take medicines. You will be told what medicines to take. Eat foods that are high in fiber. These include beans, whole grains, and fresh fruits and vegetables. Limit foods that are high in fat and sugar. These include fried or sweet foods. General instructions When you cough, try to cough gently. You may try to push your hernia back in by gently pressing on it when you are lying down. Do not try to force the bulge back in if it will not go in easily. If you are overweight, work with your doctor to lose weight safely. Do not smoke or use any products that contain nicotine or tobacco. If you need help quitting, ask your  doctor. If you will be having surgery, watch your hernia for changes in shape, size, or color. Tell your doctor if you see any changes. Take over-the-counter and prescription medicines only as told by your doctor. Keep all follow-up visits. Contact a doctor if: You get new pain, swelling, or redness near your hernia. You poop fewer times in a week than normal. You have trouble pooping. You have poop that is more dry than normal. You have poop that is harder or larger than normal. Get help right away if: You have a fever or chills. You have belly  pain that gets worse. You feel like you may vomit, or you vomit. Your hernia cannot be pushed in by gently pressing on it when you are lying down. Your hernia: Changes in shape or size. Changes color. Feels hard, or it hurts when you touch it. These symptoms may be an emergency. Get help right away. Call your local emergency services (911 in the [place] Do not wait to see if the symptoms will go away. Do not drive yourself to the hospital. Summary A hernia happens when an organ or tissue inside your body pushes out through a weak spot in the belly muscles. This creates a bulge. If your hernia is small and it does not hurt, you may not need treatment. If your hernia is large or it hurts, you may need surgery. If you will be having surgery, watch your hernia for changes in shape, size, or color. Tell your doctor about any changes. This information is not intended to replace advice given to you by your health care provider. Make sure you discuss any questions you have with your health care provider. Document Revised: 06/29/2020 Document Reviewed: 06/29/2020 Elsevier Patient Education  [place].

## 2022-06-04 ENCOUNTER — Encounter (HOSPITAL_COMMUNITY): Payer: Self-pay | Admitting: Hematology

## 2022-06-04 LAB — CMP14+EGFR
ALT: 31 IU/L (ref 0–32)
AST: 32 IU/L (ref 0–40)
Albumin/Globulin Ratio: 1.6 (ref 1.2–2.2)
Albumin: 4.4 g/dL (ref 3.7–4.7)
Alkaline Phosphatase: 82 IU/L (ref 44–121)
BUN/Creatinine Ratio: 18 (ref 12–28)
BUN: 20 mg/dL (ref 8–27)
Bilirubin Total: 0.3 mg/dL (ref 0.0–1.2)
CO2: 27 mmol/L (ref 20–29)
Calcium: 10.2 mg/dL (ref 8.7–10.3)
Chloride: 101 mmol/L (ref 96–106)
Creatinine, Ser: 1.12 mg/dL — ABNORMAL HIGH (ref 0.57–1.00)
Globulin, Total: 2.8 g/dL (ref 1.5–4.5)
Glucose: 134 mg/dL — ABNORMAL HIGH (ref 70–99)
Potassium: 3.3 mmol/L — ABNORMAL LOW (ref 3.5–5.2)
Sodium: 142 mmol/L (ref 134–144)
Total Protein: 7.2 g/dL (ref 6.0–8.5)
eGFR: 51 mL/min/{1.73_m2} — ABNORMAL LOW (ref >59)

## 2022-06-04 LAB — VITAMIN D 25 HYDROXY (VIT D DEFICIENCY, FRACTURES): Vit D, 25-Hydroxy: 49.4 ng/mL (ref 30.0–100.0)

## 2022-06-08 ENCOUNTER — Ambulatory Visit: Payer: Medicare HMO | Admitting: Family Medicine

## 2022-06-08 ENCOUNTER — Encounter: Payer: Self-pay | Admitting: Emergency Medicine

## 2022-06-13 ENCOUNTER — Ambulatory Visit: Payer: Self-pay | Admitting: *Deleted

## 2022-06-13 NOTE — Chronic Care Management (AMB) (Signed)
  Chronic Care Management   Note  06/13/2022 Name: Gabrielle [name] MRN: [id number] DOB: [birth year]   Patient has not recently engaged with the Chronic Care Management RN Care Manager. Removing RN Care Manager from Care Team and closing [place]. If patient is currently engaged with another CCM team member I will forward this encounter to inform them of my case closure. Patient may be eligible for re-engagement with RN Care Manager in the future if necessary and can discuss this with their PCP.  Chong Sicilian, BSN, RN-BC Embedded Chronic Care Manager [facility] Direct Dial: [phone]

## 2022-06-15 ENCOUNTER — Other Ambulatory Visit (HOSPITAL_COMMUNITY): Payer: Medicare HMO

## 2022-06-15 ENCOUNTER — Ambulatory Visit (HOSPITAL_COMMUNITY): Payer: Medicare HMO

## 2022-06-15 ENCOUNTER — Ambulatory Visit (HOSPITAL_COMMUNITY): Payer: Medicare HMO | Admitting: Hematology

## 2022-06-17 NOTE — Progress Notes (Signed)
[facility] Note  06/30/2022    CHIEF COMPLAINT Patient presents for Retina Follow Up  HISTORY OF PRESENT ILLNESS: Gabrielle [name] is a 76 y.o. female who presents to the clinic today for:   HPI     Retina Follow Up   Patient presents with  Wet AMD (IVE OS 06.22.23).  In both eyes.  Severity is moderate.  Duration of 6 weeks.  Since onset it is stable.  I, the attending physician,  performed the HPI with the patient and updated documentation appropriately.        Comments   Patient can not tell if there has been any vision changes at this time.       Last edited by Bernarda Caffey, MD on 06/30/2022  8:55 PM.    Pt states shes doing well, VA seems the same.   Referring physician: Janora Norlander, DO [place],  [place] [address]  HISTORICAL INFORMATION:   Selected notes from the MEDICAL RECORD NUMBER Referred by Dr. Cristela Blue for concern of ARMD OU   CURRENT MEDICATIONS: Current Outpatient Medications (Ophthalmic Drugs)  Medication Sig   dorzolamide-timolol (COSOPT) 22.3-6.8 MG/ML ophthalmic solution INSTILL ONE DROP IN [facility] EYE TWICE DAILY   No current facility-administered medications for this visit. (Ophthalmic Drugs)   Current Outpatient Medications (Other)  Medication Sig   alendronate (FOSAMAX) 70 MG tablet Take 1 tablet (70 mg total) by mouth every 7 (seven) days. Take with a full glass of water on an empty stomach.   amLODipine (NORVASC) 10 MG tablet Take 1 tablet (10 mg total) by mouth daily. For blood pressure   atorvastatin (LIPITOR) 20 MG tablet Take 1 tablet (20 mg total) by mouth daily. For cholesterol   cholecalciferol (VITAMIN D3) 25 MCG (1000 UT) tablet Take 1,000 Units by mouth daily.   CRANBERRY FRUIT PO Take 1 tablet by mouth daily.   diclofenac Sodium (VOLTAREN) 1 % GEL APPLY 4 GRAMS TO AFFECTED AREA(S) 4 TIMES A DAY   ELIQUIS 5 MG TABS tablet TAKE ONE TABLET TWICE DAILY   FEROSUL 325 (65 Fe) MG tablet TAKE   (1)  TABLET TWICE A DAY WITH MEALS (BREAKFAST AND SUPPER)   Garlic 381 MG TABS Take by mouth.   lidocaine-prilocaine (EMLA) cream Apply a small amount to port a cath site and cover with plastic wrap 1 hour prior to chemotherapy appointments   losartan-hydrochlorothiazide (HYZAAR) 100-25 MG tablet TAKE 1 TABLET DAILY   potassium chloride SA (KLOR-CON M) 20 MEQ tablet Take 1 tablet (20 mEq total) by mouth 3 (three) times daily.   prochlorperazine (COMPAZINE) 10 MG tablet Take 1 tablet (10 mg total) by mouth every 6 (six) hours as needed for nausea or vomiting.   No current facility-administered medications for this visit. (Other)   REVIEW OF SYSTEMS: ROS   Positive for: Gastrointestinal, Genitourinary, Musculoskeletal, Cardiovascular, Eyes Negative for: Constitutional, Neurological, Skin, HENT, Endocrine, Respiratory, Psychiatric, Allergic/Imm, Heme/Lymph Last edited by Annie Paras, COT on 06/30/2022 12:23 PM.     ALLERGIES Allergies  Allergen Reactions   Lisinopril Cough   PAST MEDICAL HISTORY Past Medical History:  Diagnosis Date   Arthritis    Cancer [facility])    Cataract    OU   Colon cancer ([place])    Family history of pancreatic cancer 08/21/2020   Family history of uterine cancer 08/21/2020   H/O cesarean section    Hx of tonsillectomy    Hyperlipidemia  Hypertension    Macular degeneration    Exu ARMD OU   Paroxysmal atrial fibrillation ([place]) 07/05/2020   Past Surgical History:  Procedure Laterality Date   BIOPSY  04/06/2020   Procedure: BIOPSY;  Surgeon: Rogene Houston, MD;  Location: [facility];  Service: Endoscopy;;   CARPAL TUNNEL RELEASE Right    CATARACT EXTRACTION W/PHACO Left 10/11/2019   Procedure: CATARACT EXTRACTION PHACO AND INTRAOCULAR LENS PLACEMENT ([place]);  Surgeon: Baruch Goldmann, MD;  Location: [facility] ORS;  Service: Ophthalmology;  Laterality: Left;  CDE: 8.56   CATARACT EXTRACTION W/PHACO Right 10/25/2019   Procedure: CATARACT EXTRACTION PHACO  AND INTRAOCULAR LENS PLACEMENT (IOC);  Surgeon: Baruch Goldmann, MD;  Location: [facility] ORS;  Service: Ophthalmology;  Laterality: Right;  CDE: 5.67   CESAREAN SECTION     COLONOSCOPY N/A 04/06/2020   Procedure: COLONOSCOPY;  Surgeon: Rogene Houston, MD;  Location: [facility];  Service: Endoscopy;  Laterality: N/A;   CYSTOSCOPY WITH BIOPSY N/A 04/08/2020   Procedure: CYSTOSCOPY WITH BIOPSY;  Surgeon: Cleon Gustin, MD;  Location: [facility] OR[facility];  Service: Urology;  Laterality: N/A;   ESOPHAGOGASTRODUODENOSCOPY N/A 04/05/2020   Procedure: ESOPHAGOGASTRODUODENOSCOPY (EGD);  Surgeon: Rogene Houston, MD;  [facility];  Service: Endoscopy;  Laterality: N/A;   PARTIAL COLECTOMY N/A 04/08/2020   Procedure: PARTIAL COLECTOMY;  Surgeon: Virl Cagey, MD;  Location: [facility] OR[facility];  Service: General;  Laterality: N/A;   PORTACATH PLACEMENT Left 05/18/2020   Procedure: INSERTION PORT-A-CATH (ATTACHED CATHETER IN LEFT SUBCLAVIAN);  Surgeon: Virl Cagey, MD;  Location: [facility];  Service: General;  Laterality: Left;   TONSILLECTOMY     FAMILY HISTORY Family History  Problem Relation Age of Onset   Macular degeneration Mother    Stroke Mother    Hypertension Mother    Dementia Mother    Lung cancer Father        dx late 54s; smoking hx   Diabetes Sister    Hypertension Sister    Leukemia Paternal Uncle        d. 57s   Cancer Maternal Aunt        ovarian or endometrial dx 70s   Pancreatic cancer Paternal Uncle        d. late 69s   SOCIAL HISTORY Social History   Tobacco Use   Smoking status: Never   Smokeless tobacco: Never  Vaping Use   Vaping Use: Never used  Substance Use Topics   Alcohol use: No   Drug use: No       OPHTHALMIC EXAM: Base Eye Exam     Visual Acuity (Snellen - Linear)       Right Left   Dist cc CF at 3' 20/70   Dist ph cc NI NI    Correction: Glasses         Tonometry (Tonopen, 12:26 PM)       Right Left   Pressure 20 20         Pupils        Dark Light Shape React APD   Right 3 2 Round Brisk None   Left 3 2 Round Brisk None         Visual Fields       Left Right    Full Full         Extraocular Movement       Right Left    Full, Ortho Full, Ortho         Neuro/Psych  Oriented x3: Yes   Mood/Affect: Normal         Dilation     Both eyes: 2.5% Phenylephrine, 1.0% Mydriacyl @ 12:24 PM           Slit Lamp and Fundus Exam     Slit Lamp Exam       Right Left   Lids/Lashes Dermatochalasis - upper lid Dermatochalasis - upper lid   Conjunctiva/Sclera 1+ Injection Trace Injection   Cornea Arcus, 3+ fine Punctpate epithelial erosions Arcus, 3+ fine Punctate epithelial erosions, mild tear film debris   Anterior Chamber Deep and quiet Deep and quiet   Iris Round and well dilated Round and well dilated   Lens PCIOL in good position, 1+ Posterior capsular opacification PC IOL in good position with open PC   Anterior Vitreous Vitreous syneresis, Posterior vitreous detachment, vitreous condensations Vitreous syneresis, Posterior vitreous detachment, silicone oil micro drops         Fundus Exam       Right Left   Disc pink and sharp, compact mild pallor, sharp, Compact, vascular loops superiorly   C/D Ratio 0.2 0.2   Macula Blunted foveal reflex, Drusen, Pigment clumping and atrophy, central thickening/pigmented disciform scar, +PED, no heme, persistent, trace cystic changes overlying central scar - stable Blunted foveal reflex, central thickening, RPE clumping and atrophy, Drusen, trace cystic changes overlying PED - persistent / slightly improved, no heme   Vessels Vascular attenuation, Tortuous Vascular attenuation, Tortuous   Periphery Attached, mild Reticular degeneration Attached, mild Reticular degeneration           Refraction     Wearing Rx       Sphere Cylinder Add   Right [place] Sphere +3.50   Left -0.50 Sphere +3.50    Type: PAL           IMAGING AND PROCEDURES   Imaging and Procedures for 03/21/18  OCT, Retina - OU - Both Eyes       Right Eye Quality was good. Central Foveal Thickness: 453. Progression has been stable. Findings include no SRF, abnormal foveal contour, retinal drusen , outer retinal tubulation, subretinal hyper-reflective material, disciform scar, epiretinal membrane, intraretinal fluid, pigment epithelial detachment, outer retinal atrophy (Stable sub-retinal scar, mild persistent cystic changes overlying).   Left Eye Quality was good. Central Foveal Thickness: 303. Progression has been stable. Findings include no SRF, abnormal foveal contour, retinal drusen , subretinal hyper-reflective material, intraretinal fluid, pigment epithelial detachment, outer retinal atrophy (Persistent cystic changes overlying PED/SRHM ).   Notes *Images captured and stored on drive  Diagnosis / Impression:  Exudative ARMD OU OD: Stable sub-retinal scar, mild persistent cystic changes overlying OS: Persistent cystic changes overlying PED/SRHM   Clinical management:  See below  Abbreviations: NFP - Normal foveal profile. CME - cystoid macular edema. PED - pigment epithelial detachment. IRF - intraretinal fluid. SRF - subretinal fluid. EZ - ellipsoid zone. ERM - epiretinal membrane. ORA - outer retinal atrophy. ORT - outer retinal tubulation. SRHM - subretinal hyper-reflective material      Intravitreal Injection, Pharmacologic Agent - OS - Left Eye       Time Out 06/30/2022. 1:11 PM. Confirmed correct patient, procedure, site, and patient consented.   Anesthesia Topical anesthesia was used. Anesthetic medications included Lidocaine 2%, Proparacaine 0.5%.   Procedure Preparation included 5% betadine to ocular surface, eyelid speculum. A (32g) needle was used.   Injection: 2 mg aflibercept 2 MG/0.05ML   Route: Intravitreal, Site: Left Eye   NDC:  20254-270-62, Lot: [id number], Expiration date: 04/04/2023, Waste: 0 mL   Post-op Post  injection exam found visual acuity of at least counting fingers. The patient tolerated the procedure well. There were no complications. The patient received written and verbal post procedure care education. Post injection medications were not given.            ASSESSMENT/PLAN:    ICD-10-CM   1. Exudative age-related macular degeneration of both eyes with active choroidal neovascularization (HCC)  H35.3231 OCT, Retina - OU - Both Eyes    Intravitreal Injection, Pharmacologic Agent - OS - Left Eye    aflibercept (EYLEA) SOLN 2 mg    2. Posterior vitreous detachment of both eyes  H43.813     3. Pseudophakia of both eyes  Z96.1     4. Ocular hypertension, bilateral  H40.053     5. Dry eyes  H04.123      1. Exudative age-related macular degeneration, both eyes  - severe exudative disease with very active CNVM OU at presentation in January 2019  - S/P IVA OD #1 (01.04.19), #2 (02.15.19)  - S/P IVA OS #1 (01.18.19), #2 (02.15.19)  - switched to Eylea 3.18.19 due to severity of disease  - S/P IVE OD #1 (03.18.19), #2 (04.17.19), #3 (05.15.19), #4 (10.02.19),  #5 (01.15.20), #6 (06.04.20), #7 (10.29.20), #8 (01.21.21), #9 (05.27.21), #10 (07.22.21) -- injections held due to stable disciform scar  - S/P IVE OS #1 (03.18.19), #2 (04.17.19), #3 (05.15.19), #4 (06.12.19), #5 (07.10.19), #6 (08.07.19), #7 (09.04.19), #8 (10.02.19), #9 (11.06.19), #10 (12.11.19), #11 (01.15.20), #12 (02.20.20), #13 (03.26.20), #14 (04.30.20), #15 (06.04.20), #16 (07.16.20), # 17 (08.20.20), #18 (09.24.20), #19 (10.29.20), #20 (12.03.20), #21 (01.21.21), #22 (03.18.21), #23 (05.27.21), #24 (07.22.21), #25 (11.11.21), #26 (12.09.21), #27 (01.06.22), #28 (2.7.22), #29 (03.24.22), #30 (4.28.22), #31 (05.31.22), #32 (06.30.22), #33 (08.04.22), #34 (09.15.22), #35 (10.27.22), #36 (12.08.22), #37 (01.12.23), #38 (02.16.23), #39 (03.30.23), #40 (05.11.23), #41 (06.22.23)  - IVE OS held from 7.22.21 to 11.11.21 due to  TIA/stroke on 8.1.21  - OCT OD: Stable sub-retinal scar, mild persistent cystic changes overlying; OS: trace persistent cystic changes overlying PED/SRHM at 6 weeks  - exam OS shows faint IRH nasal fovea stably resolved, no heme  - BCVA OD stable at CF 3'; OS 20/70 -- stable  - recommend IVE OS #42 today, 07.27.23 w/ f/u 5 wks - will cont to hold OD  - Eylea informed consent form signed and scanned on 01.21.2021  - Good Days approved 12/05/2020 - 12/04/2022  - f/u in 5 weeks, sooner prn for DFE/OCT/possible injection(s)  2. PVD / vitreous syneresis  - Discussed findings and prognosis   - No RT or RD on 360 exam  - Reviewed s/s of RT/RD  - Strict return precautions for any such RT/RD signs/symptoms   3. Pseudophakia OU  - s/p CE/IOL (Dr. Marisa Hua, 11.2020)  - beautiful surgeries w/ IOLs in excellent position, doing well  - monitor   4. Ocular Hypertension OU  - IOP 20 OU  - cont Cosopt BID OU  5. Dry eyes OU - recommend artificial tears and lubricating ointment as needed  Ophthalmic Meds Ordered this visit:  Meds ordered this encounter  Medications   aflibercept (EYLEA) SOLN 2 mg     Return in about 5 weeks (around 08/04/2022) for exu ARMD OU, DFE, OCT, possible injection.  There are no Patient Instructions on file for this visit.  This document serves as a record of services personally performed by Gardiner Sleeper,  MD, PhD. It was created on their behalf by San Jetty. Owens Shark, OA an ophthalmic technician. The creation of this record is the provider's dictation and/or activities during the visit.    Electronically signed by: San Jetty. Owens Shark, [place] 07.14.2023 9:08 PM  This document serves as a record of services personally performed by Gardiner Sleeper, MD, PhD. It was created on their behalf by Orvan Falconer, an ophthalmic technician. The creation of this record is the provider's dictation and/or activities during the visit.    Electronically signed by: Orvan Falconer, OA,  06/30/22  9:08 PM   Gardiner Sleeper, M.D., Ph.D. Diseases & Surgery of the Retina and Vitreous [facility]  I have reviewed the above documentation for accuracy and completeness, and I agree with the above. Gardiner Sleeper, M.D., Ph.D. 06/30/22 9:14 PM   Abbreviations: M myopia (nearsighted); A astigmatism; H hyperopia (farsighted); P presbyopia; Mrx spectacle prescription;  CTL contact lenses; OD right eye; OS left eye; OU both eyes  XT exotropia; ET esotropia; PEK punctate epithelial keratitis; PEE punctate epithelial erosions; DES dry eye syndrome; MGD meibomian gland dysfunction; ATs artificial tears; PFAT's preservative free artificial tears; [place] nuclear sclerotic cataract; PSC posterior subcapsular cataract; ERM epi-retinal membrane; PVD posterior vitreous detachment; RD retinal detachment; DM diabetes mellitus; DR diabetic retinopathy; NPDR non-proliferative diabetic retinopathy; PDR proliferative diabetic retinopathy; CSME clinically significant macular edema; DME diabetic macular edema; dbh dot blot hemorrhages; CWS cotton wool spot; POAG primary open angle glaucoma; C/D cup-to-disc ratio; HVF humphrey visual field; GVF goldmann visual field; OCT optical coherence tomography; IOP intraocular pressure; BRVO Branch retinal vein occlusion; CRVO central retinal vein occlusion; CRAO central retinal artery occlusion; BRAO branch retinal artery occlusion; RT retinal tear; SB scleral buckle; PPV pars plana vitrectomy; VH Vitreous hemorrhage; PRP panretinal laser photocoagulation; IVK intravitreal kenalog; VMT vitreomacular traction; MH Macular hole;  NVD neovascularization of the disc; NVE neovascularization elsewhere; AREDS age related eye disease study; ARMD age related macular degeneration; POAG primary open angle glaucoma; EBMD epithelial/anterior basement membrane dystrophy; ACIOL anterior chamber intraocular lens; IOL intraocular lens; PCIOL posterior chamber intraocular  lens; Phaco/IOL phacoemulsification with intraocular lens placement; [place] photorefractive keratectomy; LASIK laser assisted in situ keratomileusis; HTN hypertension; DM diabetes mellitus; COPD chronic obstructive pulmonary disease

## 2022-06-22 ENCOUNTER — Inpatient Hospital Stay (HOSPITAL_COMMUNITY): Payer: Medicare HMO

## 2022-06-22 ENCOUNTER — Inpatient Hospital Stay (HOSPITAL_COMMUNITY): Payer: Medicare HMO | Attending: Hematology

## 2022-06-22 ENCOUNTER — Inpatient Hospital Stay (HOSPITAL_BASED_OUTPATIENT_CLINIC_OR_DEPARTMENT_OTHER): Payer: Medicare HMO | Admitting: Hematology

## 2022-06-22 VITALS — BP 199/83 | HR 77 | Temp 97.8°F | Ht 62.0 in | Wt 172.9 lb

## 2022-06-22 VITALS — BP 160/71 | HR 66 | Temp 97.5°F | Resp 18

## 2022-06-22 DIAGNOSIS — C182 Malignant neoplasm of ascending colon: Secondary | ICD-10-CM | POA: Insufficient documentation

## 2022-06-22 DIAGNOSIS — C189 Malignant neoplasm of colon, unspecified: Secondary | ICD-10-CM

## 2022-06-22 DIAGNOSIS — C779 Secondary and unspecified malignant neoplasm of lymph node, unspecified: Secondary | ICD-10-CM | POA: Diagnosis not present

## 2022-06-22 DIAGNOSIS — E876 Hypokalemia: Secondary | ICD-10-CM | POA: Insufficient documentation

## 2022-06-22 DIAGNOSIS — Z5112 Encounter for antineoplastic immunotherapy: Secondary | ICD-10-CM | POA: Diagnosis not present

## 2022-06-22 DIAGNOSIS — Z79899 Other long term (current) drug therapy: Secondary | ICD-10-CM | POA: Insufficient documentation

## 2022-06-22 LAB — COMPREHENSIVE METABOLIC PANEL
ALT: 34 U/L (ref 0–44)
AST: 32 U/L (ref 15–41)
Albumin: 4 g/dL (ref 3.5–5.0)
Alkaline Phosphatase: 64 U/L (ref 38–126)
Anion gap: 7 (ref 5–15)
BUN: 20 mg/dL (ref 8–23)
CO2: 31 mmol/L (ref 22–32)
Calcium: 9.7 mg/dL (ref 8.9–10.3)
Chloride: 100 mmol/L (ref 98–111)
Creatinine, Ser: 1.19 mg/dL — ABNORMAL HIGH (ref 0.44–1.00)
GFR, Estimated: 48 mL/min — ABNORMAL LOW (ref >60)
Glucose, Bld: 107 mg/dL — ABNORMAL HIGH (ref 70–99)
Potassium: 3.3 mmol/L — ABNORMAL LOW (ref 3.5–5.1)
Sodium: 138 mmol/L (ref 135–145)
Total Bilirubin: 0.6 mg/dL (ref 0.3–1.2)
Total Protein: 7.6 g/dL (ref 6.5–8.1)

## 2022-06-22 LAB — CBC WITH DIFFERENTIAL/PLATELET
Abs Immature Granulocytes: 0.04 10*3/uL (ref 0.00–0.07)
Basophils Absolute: 0 10*3/uL (ref 0.0–0.1)
Basophils Relative: 1 %
Eosinophils Absolute: 0.2 10*3/uL (ref 0.0–0.5)
Eosinophils Relative: 4 %
HCT: 40.6 % (ref 36.0–46.0)
Hemoglobin: 13.5 g/dL (ref 12.0–15.0)
Immature Granulocytes: 1 %
Lymphocytes Relative: 22 %
Lymphs Abs: 1.3 10*3/uL (ref 0.7–4.0)
MCH: 31 pg (ref 26.0–34.0)
MCHC: 33.3 g/dL (ref 30.0–36.0)
MCV: 93.3 fL (ref 80.0–100.0)
Monocytes Absolute: 0.6 10*3/uL (ref 0.1–1.0)
Monocytes Relative: 10 %
Neutro Abs: 3.7 10*3/uL (ref 1.7–7.7)
Neutrophils Relative %: 62 %
Platelets: 141 10*3/uL — ABNORMAL LOW (ref 150–400)
RBC: 4.35 MIL/uL (ref 3.87–5.11)
RDW: 13.4 % (ref 11.5–15.5)
WBC: 5.9 10*3/uL (ref 4.0–10.5)
nRBC: 0 % (ref 0.0–0.2)

## 2022-06-22 LAB — MAGNESIUM: Magnesium: 2 mg/dL (ref 1.7–2.4)

## 2022-06-22 LAB — TSH: TSH: 1.944 u[IU]/mL (ref 0.350–4.500)

## 2022-06-22 MED ORDER — SODIUM CHLORIDE 0.9% FLUSH
10.0000 mL | INTRAVENOUS | Status: DC | PRN
  Administered 2022-06-22: 10 mL

## 2022-06-22 MED ORDER — HEPARIN SOD (PORK) LOCK FLUSH 100 UNIT/ML IV SOLN
500.0000 [IU] | Freq: Once | INTRAVENOUS | Status: AC
  Administered 2022-06-22: 500 [IU] via INTRAVENOUS

## 2022-06-22 MED ORDER — SODIUM CHLORIDE 0.9 % IV SOLN: Freq: Once | INTRAVENOUS | Status: AC

## 2022-06-22 MED ORDER — SODIUM CHLORIDE 0.9 % IV SOLN
200.0000 mg | Freq: Once | INTRAVENOUS | Status: AC
  Administered 2022-06-22: 200 mg via INTRAVENOUS
  Filled 2022-06-22: qty 8

## 2022-06-22 NOTE — Progress Notes (Signed)
[name], [place] [address]   CLINIC:  Medical Oncology/Hematology  PCP:  Janora Norlander, DO [address] [place] [address] [phone]   REASON FOR VISIT:  Follow-up for right colon cancer  PRIOR THERAPY:  1. Right hemicolectomy on 04/08/2020. 2. FOLFOX x 3 cycles from 05/19/2020 to 07/14/2020.  NGS Results: Foundation 1 KRAS/NRAS wild-type, MSI--high, TMB 55 Muts/Mb, BRAF V 600 E  CURRENT THERAPY: Keytruda every 3 weeks  BRIEF ONCOLOGIC HISTORY:  Oncology History  Malignant neoplasm of colon ([place])  04/07/2020 Initial Diagnosis   Malignant neoplasm of ascending colon ([place])   05/13/2020 Genetic Testing   Foundation One     05/19/2020 - 07/02/2020 Chemotherapy   The patient had palonosetron (ALOXI) injection 0.25 mg, 0.25 mg, Intravenous,  Once, 2 of 12 cycles Administration: 0.25 mg (05/19/2020), 0.25 mg (06/30/2020) leucovorin 522 mg in dextrose 5 % 250 mL infusion, 320 mg/m2 = 522 mg (80 % of original dose 400 mg/m2), Intravenous,  Once, 2 of 12 cycles Dose modification: 320 mg/m2 (80 % of original dose 400 mg/m2, Cycle 1, Reason: Provider Judgment), 320.2334 mg/m2 (66.7 % of original dose 400 mg/m2, Cycle 2, Reason: Provider Judgment) Administration: 522 mg (05/19/2020), 434 mg (06/30/2020) oxaliplatin (ELOXATIN) 110 mg in dextrose 5 % 500 mL chemo infusion, 68 mg/m2 = 110 mg (80 % of original dose 85 mg/m2), Intravenous,  Once, 1 of 1 cycle Dose modification: 68 mg/m2 (80 % of original dose 85 mg/m2, Cycle 1, Reason: Provider Judgment) Administration: 110 mg (05/19/2020) fluorouracil (ADRUCIL) chemo injection 500 mg, 320 mg/m2 = 500 mg (80 % of original dose 400 mg/m2), Intravenous,  Once, 2 of 12 cycles Dose modification: 320 mg/m2 (80 % of original dose 400 mg/m2, Cycle 1, Reason: Provider Judgment), 356.8616 mg/m2 (66.7 % of original dose 400 mg/m2, Cycle 2, Reason: Provider Judgment) Administration: 500 mg (05/19/2020), 450  mg (06/30/2020) fluorouracil (ADRUCIL) 3,150 mg in sodium chloride 0.9 % 87 mL chemo infusion, 1,920 mg/m2 = 3,150 mg (80 % of original dose 2,400 mg/m2), Intravenous, 1 Day/Dose, 2 of 12 cycles Dose modification: 1,920 mg/m2 (80 % of original dose 2,400 mg/m2, Cycle 1, Reason: Provider Judgment), 1,600 mg/m2 (66.7 % of original dose 2,400 mg/m2, Cycle 2, Reason: Provider Judgment) Administration: 3,150 mg (05/19/2020), 2,600 mg (06/30/2020)  for chemotherapy treatment.    08/03/2020 Genetic Testing   Guardant Reveal Testing     08/14/2020 Genetic Testing   No pathogenic variants detected in [facility] Common Hereditary Cancers Panel.  Variant of uncertain significance (VUS) detected in HOXB13 at c.634G>A (p.Ala212Thr). The Common Hereditary Cancers Panel offered by [facility] includes sequencing and/or deletion duplication testing of the following 48 genes: APC, ATM, AXIN2, BARD1, BMPR1A, BRCA1, BRCA2, BRIP1, CDH1, CDK4, CDKN2A (p14ARF), CDKN2A (p16INK4a), CHEK2, CTNNA1, DICER1, EPCAM (Deletion/duplication testing only), FLCN, GREM1 (promoter region deletion/duplication testing only), KIT, MEN1, MLH1, MSH2, MSH3, MSH6, MUTYH, NBN, NF1, NHTL1, PALB2, PDGFRA, PMS2, POLD1, POLE, PTEN, RAD50, RAD51C, RAD51D, RNF43, SDHB, SDHC, SDHD, SMAD4, SMARCA4. STK11, TP53, TSC1, TSC2, and VHL.  The following genes were evaluated for sequence changes only: SDHA and HOXB13 c.251G>A variant only. The report date is August 14, 2020.    11/05/2020 Cancer Staging   Staging form: Colon and Rectum, AJCC 8th Edition - Pathologic stage from 11/05/2020: Stage IVC (rpTX, pN0, pM1c) - Signed by Derek Jack, MD on 11/05/2020   11/10/2020 -  Chemotherapy   Patient is on Treatment Plan : COLORECTAL Pembrolizumab q21d  CANCER STAGING:  Cancer Staging  Malignant neoplasm of colon [facility]) Staging form: Colon and Rectum, AJCC 8th Edition - Clinical stage from 04/27/2020: Stage IIIB (cT4a, cN1a, cM0) - Unsigned - Pathologic  stage from 11/05/2020: Stage IVC (rpTX, pN0, pM1c) - Signed by Derek Jack, MD on 11/05/2020   INTERVAL HISTORY:  Gabrielle [name], a 76 y.o. female, returns for routine follow-up and consideration for next cycle of chemotherapy. Gabrielle [name] was last seen on 05/04/2022.  Due for cycle #25 of Keytruda today.   Overall, she tells me she has been feeling pretty well, and she is accompanied by her sister. He reports an itching rash on her right elbow. Her weight is stable. Her energy is good. She continues to take Eliquis and denies bleeding issues.   Overall, she feels ready for next cycle of chemo today.    REVIEW OF SYSTEMS:  Review of Systems  Constitutional:  Negative for appetite change, fatigue and unexpected weight change.  HENT:   Negative for nosebleeds.   Respiratory:  Negative for hemoptysis.   Gastrointestinal:  Negative for blood in stool.  Genitourinary:  Negative for hematuria.   Skin:  Positive for rash (R arm).  Hematological:  Does not bruise/bleed easily.  All other systems reviewed and are negative.   PAST MEDICAL/SURGICAL HISTORY:  Past Medical History:  Diagnosis Date   Arthritis    Cancer [facility])    Cataract    OU   Colon cancer ([place])    Family history of pancreatic cancer 08/21/2020   Family history of uterine cancer 08/21/2020   H/O cesarean section    Hx of tonsillectomy    Hyperlipidemia    Hypertension    Macular degeneration    Exu ARMD OU   Paroxysmal atrial fibrillation ([place]) 07/05/2020   Past Surgical History:  Procedure Laterality Date   BIOPSY  04/06/2020   Procedure: BIOPSY;  Surgeon: Rogene [place], MD;  Location: [facility] ENDO SUITE;  Service: Endoscopy;;   CARPAL TUNNEL RELEASE Right    CATARACT EXTRACTION W/PHACO Left 10/11/2019   Procedure: CATARACT EXTRACTION PHACO AND INTRAOCULAR LENS PLACEMENT ([place]);  Surgeon: Baruch Goldmann, MD;  Location: [facility] ORS;  Service: Ophthalmology;  Laterality: Left;  CDE: 8.56   CATARACT EXTRACTION W/PHACO  Right 10/25/2019   Procedure: CATARACT EXTRACTION PHACO AND INTRAOCULAR LENS PLACEMENT (I[facility]);  Surgeon: Baruch Goldmann, MD;  Location: [facility] ORS;  Service: Ophthalmology;  Laterality: Right;  CDE: 5.67   CESAREAN SECTION     COLONOSCOPY N/A 04/06/2020   Procedure: COLONOSCOPY;  Surgeon: Rogene [place], MD;  Location: [facility] ENDO SUITE;  Service: Endoscopy;  Laterality: N/A;   CYSTOSCOPY WITH BIOPSY N/A 04/08/2020   Procedure: CYSTOSCOPY WITH BIOPSY;  Surgeon: Cleon Gustin, MD;  Location: [facility] ORS;  Service: Urology;  Laterality: N/A;   ESOPHAGOGASTRODUODENOSCOPY N/A 04/05/2020   Procedure: ESOPHAGOGASTRODUODENOSCOPY (EGD);  Surgeon: Rogene [place], MD;  Location: [facility] ENDO SUITE;  Service: Endoscopy;  Laterality: N/A;   PARTIAL COLECTOMY N/A 04/08/2020   Procedure: PARTIAL COLECTOMY;  Surgeon: Virl Cagey, MD;  Location: [facility] ORS;  Service: General;  Laterality: N/A;   PORTACATH PLACEMENT Left 05/18/2020   Procedure: INSERTION PORT-A-CATH (ATTACHED CATHETER IN LEFT SUBCLAVIAN);  Surgeon: Virl Cagey, MD;  Location: [facility] ORS;  Service: General;  Laterality: Left;   TONSILLECTOMY      SOCIAL HISTORY:  Social History   Socioeconomic History   Marital status: Divorced    Spouse name: Not on file   Number of  children: Not on file   Years of education: Not on file   Highest education level: Not on file  Occupational History   Not on file  Tobacco Use   Smoking status: Never   Smokeless tobacco: Never  Vaping Use   Vaping Use: Never used  Substance and Sexual Activity   Alcohol use: No   Drug use: No   Sexual activity: Not Currently  Other Topics Concern   Not on file  Social History Narrative   Not on file   Social Determinants of Health   Financial Resource Strain: Low Risk  (02/21/2022)   Overall Financial Resource Strain (CARDIA)    Difficulty of Paying Living Expenses: Not hard at all  Food Insecurity: No Food Insecurity (02/21/2022)   Hunger Vital Sign    Worried About  Running Out of Food in the Last Year: Never true    Ran Out of Food in the Last Year: Never true  Transportation Needs: No Transportation Needs (02/21/2022)   PRAPARE - Hydrologist (Medical): No    Lack of Transportation (Non-Medical): No  Physical Activity: Sufficiently Active (02/21/2022)   Exercise Vital Sign    Days of Exercise per Week: 5 days    Minutes of Exercise per Session: 30 min  Stress: No Stress Concern Present (02/21/2022)   [place]    Feeling of Stress : Not at all  Social Connections: Moderately Isolated (02/21/2022)   Social Connection and Isolation Panel [NHANES]    Frequency of Communication with Friends and Family: More than three times a week    Frequency of Social Gatherings with Friends and Family: More than three times a week    Attends Religious Services: 1 to 4 times per year    Active Member of [employer] or Organizations: No    Attends Archivist Meetings: Never    Marital Status: Divorced  Human resources officer Violence: Not At Risk (02/21/2022)   Humiliation, Afraid, Rape, and Kick questionnaire    Fear of Current or Ex-Partner: No    Emotionally Abused: No    Physically Abused: No    Sexually Abused: No    FAMILY HISTORY:  Family History  Problem Relation Age of Onset   Macular degeneration Mother    Stroke Mother    Hypertension Mother    Dementia Mother    Lung cancer Father        dx late 26s; smoking hx   Diabetes Sister    Hypertension Sister    Leukemia Paternal Uncle        d. 76s   Cancer Maternal Aunt        ovarian or endometrial dx 43s   Pancreatic cancer Paternal Uncle        d. late 22s    CURRENT MEDICATIONS:  Current Outpatient Medications  Medication Sig Dispense Refill   alendronate (FOSAMAX) 70 MG tablet Take 1 tablet (70 mg total) by mouth every 7 (seven) days. Take with a full glass of water on an empty stomach. 12 tablet 3    amLODipine (NORVASC) 10 MG tablet Take 1 tablet (10 mg total) by mouth daily. For blood pressure 90 tablet 3   atorvastatin (LIPITOR) 20 MG tablet Take 1 tablet (20 mg total) by mouth daily. For cholesterol 90 tablet 3   cholecalciferol (VITAMIN D3) 25 MCG (1000 UT) tablet Take 1,000 Units by mouth daily.     CRANBERRY FRUIT  PO Take 1 tablet by mouth daily.     diclofenac Sodium (VOLTAREN) 1 % GEL APPLY 4 GRAMS TO AFFECTED AREA(S) 4 TIMES A DAY 400 g 2   dorzolamide-timolol (COSOPT) 22.3-6.8 MG/ML ophthalmic solution INSTILL ONE DROP IN EACH EYE TWICE DAILY 10 mL 3   ELIQUIS 5 MG TABS tablet TAKE ONE TABLET TWICE DAILY 60 tablet 6   FEROSUL 325 (65 Fe) MG tablet TAKE  (1)  TABLET TWICE A DAY WITH MEALS (BREAKFAST AND SUPPER) 361 tablet 0   Garlic 443 MG TABS Take by mouth.     lidocaine-prilocaine (EMLA) cream Apply a small amount to port a cath site and cover with plastic wrap 1 hour prior to chemotherapy appointments 30 g 0   losartan-hydrochlorothiazide (HYZAAR) 100-25 MG tablet TAKE 1 TABLET DAILY 90 tablet 3   potassium chloride SA (KLOR-CON M) 20 MEQ tablet Take 1 tablet (20 mEq total) by mouth 3 (three) times daily. 90 tablet 3   prochlorperazine (COMPAZINE) 10 MG tablet Take 1 tablet (10 mg total) by mouth every 6 (six) hours as needed for nausea or vomiting. 30 tablet 0   No current facility-administered medications for this visit.    ALLERGIES:  Allergies  Allergen Reactions   Lisinopril Cough    PHYSICAL EXAM:  Performance status (ECOG): 1 - Symptomatic but completely ambulatory  There were no vitals filed for this visit. Wt Readings from Last 3 Encounters:  06/03/22 175 lb 6.4 oz (79.6 kg)  05/04/22 174 lb 10.9 oz (79.2 kg)  03/23/22 172 lb 9.9 oz (78.3 kg)   Physical Exam Vitals reviewed.  Constitutional:      Appearance: Normal appearance. She is obese.  Cardiovascular:     Rate and Rhythm: Normal rate and regular rhythm.     Pulses: Normal pulses.     Heart  sounds: Normal heart sounds.  Pulmonary:     Effort: Pulmonary effort is normal.     Breath sounds: Normal breath sounds.  Skin:    Findings: Erythema (R elbow) and rash (R elbow) present. Rash is macular.  Neurological:     General: No focal deficit present.     Mental Status: She is alert and oriented to person, place, and time.  Psychiatric:        Mood and Affect: Mood normal.        Behavior: Behavior normal.     LABORATORY DATA:  I have reviewed the labs as listed.     Latest Ref Rng & Units 05/25/2022   11:35 AM 05/04/2022   11:55 AM 04/13/2022   12:24 PM  CBC  WBC 4.0 - 10.5 K/uL 5.3  6.9  6.5   Hemoglobin 12.0 - 15.0 g/dL 13.2  12.5  12.9   Hematocrit 36.0 - 46.0 % 39.0  37.4  38.3   Platelets 150 - 400 K/uL 125  158  147       Latest Ref Rng & Units 06/03/2022   10:26 AM 05/25/2022   11:35 AM 05/04/2022   11:55 AM  CMP  Glucose 70 - 99 mg/dL 134  118  80   BUN 8 - 27 mg/dL '20  17  22   ' Creatinine 0.57 - 1.00 mg/dL 1.12  0.91  1.03   Sodium 134 - 144 mmol/L 142  135  136   Potassium 3.5 - 5.2 mmol/L 3.3  3.1  3.1   Chloride 96 - 106 mmol/L 101  97  98   CO2 20 - 29  mmol/L 27  30  33   Calcium 8.7 - 10.3 mg/dL 10.2  9.5  9.7   Total Protein 6.0 - 8.5 g/dL 7.2  7.5  7.4   Total Bilirubin 0.0 - 1.2 mg/dL 0.3  0.6  0.6   Alkaline Phos 44 - 121 IU/L 82  71  65   AST 0 - 40 IU/L 32  43  36   ALT 0 - 32 IU/L 31  47  35     DIAGNOSTIC IMAGING:  I have independently reviewed the scans and discussed with the patient. Intravitreal Injection, Pharmacologic Agent - OS - Left Eye  Result Date: 05/27/2022 Time Out 05/26/2022. 8:40 AM. Confirmed correct patient, procedure, site, and patient consented. Anesthesia Topical anesthesia was used. Anesthetic medications included Lidocaine 2%, Proparacaine 0.5%. Procedure Preparation included 5% betadine to ocular surface, eyelid speculum. A (32g) needle was used. Injection: 2 mg aflibercept 2 MG/0.05ML   Route: Intravitreal, Site:  Left Eye   NDC: [id number], Lot: [id number], Expiration date: 03/05/2023, Waste: 0 mL Post-op Post injection exam found visual acuity of at least counting fingers. The patient tolerated the procedure well. There were no complications. The patient received written and verbal post procedure care education. Post injection medications were not given.   OCT, Retina - OU - Both Eyes  Result Date: 05/27/2022 Right Eye Quality was good. Central Foveal Thickness: 457. Progression has been stable. Findings include no SRF, abnormal foveal contour, retinal drusen , outer retinal tubulation, subretinal hyper-reflective material, disciform scar, epiretinal membrane, intraretinal fluid, pigment epithelial detachment, outer retinal atrophy (Stable sub-retinal scar, mild persistent cystic changes overlying). Left Eye Quality was good. Central Foveal Thickness: 322. Progression has been stable. Findings include no SRF, abnormal foveal contour, retinal drusen , subretinal hyper-reflective material, intraretinal fluid, pigment epithelial detachment, outer retinal atrophy (Trace persistent cystic changes overlying PED/SRHM ). Notes *Images captured and stored on drive Diagnosis / Impression: Exudative ARMD OU OD: Stable sub-retinal scar, mild persistent cystic changes overlying OS: trace persistent cystic changes overlying PED/SRHM Clinical management: See below Abbreviations: NFP - Normal foveal profile. CME - cystoid macular edema. PED - pigment epithelial detachment. IRF - intraretinal fluid. SRF - subretinal fluid. EZ - ellipsoid zone. ERM - epiretinal membrane. ORA - outer retinal atrophy. ORT - outer retinal tubulation. SRHM - subretinal hyper-reflective material     ASSESSMENT:  1.  Stage IIIb (T4AN1A) poorly differentiated right colon adenocarcinoma: -Right hemicolectomy on 04/07/2020 with poorly differentiated adenocarcinoma, pT4a, positive radial margin, 1/15 lymph nodes positive, loss of MLH1 and PMS2, bladder biopsy  negative for malignancy. -CTAP on 04/06/2020 showed circumferential ascending colon mass with numerous borderline enlarged pericolonic lymph nodes.  No findings of hepatic metastatic disease. -CEA on 04/04/2020 was 12.2.  CEA improved to 1.7 on 05/13/2020. -PET scan on 05/11/2020 shows mild FDG uptake associated with the anastomotic site.  Small pulmonary nodules that do not show elevated FDG activity, remain nonspecific, subcentimeter.  Persistent but improved bladder wall thickening. -Cycle 1 of dose reduced FOLFOX on 05/19/2020, followed by 2 hospitalizations, 1 from acute kidney injury from diarrhea and second admission for C. difficile colitis. -Cycle 2 of 5-FU and leucovorin dose reduced on 06/30/2020.  Chemotherapy discontinued secondary to intolerance. -Follow-up CT scan on 11/02/2020 with multiple soft tissue lesions in the central and right mesentery measuring up to 3.1 x 1.7 cm.  Mild lymphadenopathy in the hepatic duodenal ligament, retroperitoneal space, right common iliac chain, bilateral external iliac chains.  17 mm soft tissue nodule in  the lower anterior abdominal wall.  Bilateral tiny pulmonary nodules not substantially changed.  2.9 x 2.5 cm low-density lesion along the posterior uterus is indeterminate. -Pembrolizumab started on 11/10/2020. -CT CAP from 02/09/2021 showed interval near complete resolution of previously demonstrated nodal mass in the ileocolonic mesentery.  Residual ill-defined nodule medial to the tip of the right hepatic lobe.  Stable small pulmonary nodules bilaterally consistent with benign findings.   2.  Family history: -Paternal uncle had colon cancer.  Maternal aunt had brain cancer and another maternal aunt had gynecological malignancy.  Father had lung cancer and was a smoker. - Genetic testing did not reveal any notable mutations.   3.  Diffuse erythema and nodularity of the dome of the bladder: -CT scan showed severely thickened bladder wall. -Cystoscopy on 04/08/2020  showed diffuse erythema, nodularity in the posterior wall tracking to the dome.  Ureteral orifices were in normal locations.  Biopsies were benign.   PLAN:  1.  Stage IV right colon adenocarcinoma, MSI-high: - CT CAP (02/08/2022): Stable exam without new or progressive findings to suggest metastasis. - She does not report any immunotherapy related side effects. - She has erythematous macular rash on the right elbow with some scratch marks from itching.  I have recommended using hydrocortisone cream.  No other skin rashes. - Reviewed labs today which showed normal LFTs.  CBC was grossly normal with mild thrombocytopenia.  TSH is normal at 1.9.  Last CEA was 1.9. - Recommend treatment with Keytruda today and in 3 weeks.  RTC 6 weeks with repeat CEA and imaging with CT CAP with contrast.   2.  Paroxysmal atrial fibrillation: - Continue Eliquis indefinitely.  No bleeding issues.   3.  Hypertension: - Continue Hyzaar and amlodipine.  4.  Hypokalemia: - Continue potassium 20 mEq 3 times daily.   Orders placed this encounter:  Orders Placed This Encounter  Procedures   CT CHEST [place], MD [place] [phone]   I, Thana Ates, am acting as a scribe for Dr. Derek Jack.  I, Derek Jack MD, have reviewed the above documentation for accuracy and completeness, and I agree with the above.

## 2022-06-22 NOTE — Progress Notes (Signed)
Ok to treat with elevated BP  Dr Rhys Martini, PharmD

## 2022-06-22 NOTE — Progress Notes (Signed)
Patient presents today for Keytruda infusion per providers order.  BP elevated, MD aware.  Message received from Dr. Delton Coombes okay to proceed with treatment.  Keytruda given today per MD orders.  Stable during infusion without adverse affects.  Vital signs stable.  No complaints at this time.  Discharge from clinic ambulatory in stable condition.  Alert and oriented X 3.  Follow up with [facility] as scheduled.

## 2022-06-22 NOTE — Patient Instructions (Signed)
[facility]  Discharge Instructions: Thank you for choosing [facility] to provide your oncology and hematology care.  If you have a lab appointment with the Cancer Center, please come in thru the Main Entrance and check in at the main information desk.  Wear comfortable clothing and clothing appropriate for easy access to any Portacath or PICC line.   We strive to give you quality time with your provider. You may need to reschedule your appointment if you arrive late (15 or more minutes).  Arriving late affects you and other patients whose appointments are after yours.  Also, if you miss three or more appointments without notifying the office, you may be dismissed from the clinic at the provider's discretion.      For prescription refill requests, have your pharmacy contact our office and allow 72 hours for refills to be completed.    Today you received the following chemotherapy and/or immunotherapy agents Keytruda      To help prevent nausea and vomiting after your treatment, we encourage you to take your nausea medication as directed.  BELOW ARE SYMPTOMS THAT SHOULD BE REPORTED IMMEDIATELY: *FEVER GREATER THAN 100.4 F (38 C) OR HIGHER *CHILLS OR SWEATING *NAUSEA AND VOMITING THAT IS NOT CONTROLLED WITH YOUR NAUSEA MEDICATION *UNUSUAL SHORTNESS OF BREATH *UNUSUAL BRUISING OR BLEEDING *URINARY PROBLEMS (pain or burning when urinating, or frequent urination) *BOWEL PROBLEMS (unusual diarrhea, constipation, pain near the anus) TENDERNESS IN MOUTH AND THROAT WITH OR WITHOUT PRESENCE OF ULCERS (sore throat, sores in mouth, or a toothache) UNUSUAL RASH, SWELLING OR PAIN  UNUSUAL VAGINAL DISCHARGE OR ITCHING   Items with * indicate a potential emergency and should be followed up as soon as possible or go to the Emergency Department if any problems should occur.  Please show the CHEMOTHERAPY ALERT CARD or IMMUNOTHERAPY ALERT CARD at check-in to the Emergency  Department and triage nurse.  Should you have questions after your visit or need to cancel or reschedule your appointment, please contact [facility] [phone]  and follow the prompts.  Office hours are 8:00 a.m. to 4:30 p.m. Monday - Friday. Please note that voicemails left after 4:00 p.m. may not be returned until the following business day.  We are closed weekends and major holidays. You have access to a nurse at all times for urgent questions. Please call the main number to the clinic [phone] and follow the prompts.  For any non-urgent questions, you may also contact your provider using MyChart. We now offer e-Visits for anyone 18 and older to request care online for non-urgent symptoms. For details visit [email or web address].   Also download the MyChart app! Go to the app store, search "MyChart", open the app, select [facility], and log in with your MyChart username and password.  Masks are optional in the cancer centers. If you would like for your care team to wear a mask while they are taking care of you, please let them know. For doctor visits, patients may have with them one support person who is at least 76 years old. At this time, visitors are not allowed in the infusion area.  

## 2022-06-27 ENCOUNTER — Other Ambulatory Visit: Payer: Self-pay

## 2022-06-30 ENCOUNTER — Encounter (INDEPENDENT_AMBULATORY_CARE_PROVIDER_SITE_OTHER): Payer: Self-pay | Admitting: Ophthalmology

## 2022-06-30 ENCOUNTER — Encounter (INDEPENDENT_AMBULATORY_CARE_PROVIDER_SITE_OTHER): Payer: Medicare HMO | Admitting: Ophthalmology

## 2022-06-30 ENCOUNTER — Ambulatory Visit (INDEPENDENT_AMBULATORY_CARE_PROVIDER_SITE_OTHER): Payer: Medicare HMO | Admitting: Ophthalmology

## 2022-06-30 DIAGNOSIS — Z961 Presence of intraocular lens: Secondary | ICD-10-CM | POA: Diagnosis not present

## 2022-06-30 DIAGNOSIS — H40053 Ocular hypertension, bilateral: Secondary | ICD-10-CM | POA: Diagnosis not present

## 2022-06-30 DIAGNOSIS — H43813 Vitreous degeneration, bilateral: Secondary | ICD-10-CM | POA: Diagnosis not present

## 2022-06-30 DIAGNOSIS — H353231 Exudative age-related macular degeneration, bilateral, with active choroidal neovascularization: Secondary | ICD-10-CM | POA: Diagnosis not present

## 2022-06-30 DIAGNOSIS — H04123 Dry eye syndrome of bilateral lacrimal glands: Secondary | ICD-10-CM

## 2022-06-30 MED ORDER — AFLIBERCEPT 2MG/0.05ML IZ SOLN FOR KALEIDOSCOPE
2.0000 mg | INTRAVITREAL | Status: AC | PRN
  Administered 2022-06-30: 2 mg via INTRAVITREAL

## 2022-07-02 ENCOUNTER — Other Ambulatory Visit: Payer: Self-pay | Admitting: Family Medicine

## 2022-07-13 ENCOUNTER — Inpatient Hospital Stay: Payer: Medicare HMO

## 2022-07-13 ENCOUNTER — Inpatient Hospital Stay: Payer: Medicare HMO | Attending: Hematology

## 2022-07-13 VITALS — BP 172/84 | HR 65 | Temp 97.0°F | Resp 18 | Wt 173.3 lb

## 2022-07-13 DIAGNOSIS — Z79899 Other long term (current) drug therapy: Secondary | ICD-10-CM | POA: Insufficient documentation

## 2022-07-13 DIAGNOSIS — C182 Malignant neoplasm of ascending colon: Secondary | ICD-10-CM | POA: Insufficient documentation

## 2022-07-13 DIAGNOSIS — C189 Malignant neoplasm of colon, unspecified: Secondary | ICD-10-CM

## 2022-07-13 DIAGNOSIS — E876 Hypokalemia: Secondary | ICD-10-CM

## 2022-07-13 DIAGNOSIS — Z5112 Encounter for antineoplastic immunotherapy: Secondary | ICD-10-CM | POA: Insufficient documentation

## 2022-07-13 LAB — MAGNESIUM: Magnesium: 2.2 mg/dL (ref 1.7–2.4)

## 2022-07-13 LAB — CBC WITH DIFFERENTIAL/PLATELET
Abs Immature Granulocytes: 0.02 10*3/uL (ref 0.00–0.07)
Basophils Absolute: 0 10*3/uL (ref 0.0–0.1)
Basophils Relative: 1 %
Eosinophils Absolute: 0.3 10*3/uL (ref 0.0–0.5)
Eosinophils Relative: 5 %
HCT: 41.4 % (ref 36.0–46.0)
Hemoglobin: 13.8 g/dL (ref 12.0–15.0)
Immature Granulocytes: 0 %
Lymphocytes Relative: 25 %
Lymphs Abs: 1.5 10*3/uL (ref 0.7–4.0)
MCH: 31.2 pg (ref 26.0–34.0)
MCHC: 33.3 g/dL (ref 30.0–36.0)
MCV: 93.7 fL (ref 80.0–100.0)
Monocytes Absolute: 0.7 10*3/uL (ref 0.1–1.0)
Monocytes Relative: 11 %
Neutro Abs: 3.5 10*3/uL (ref 1.7–7.7)
Neutrophils Relative %: 58 %
Platelets: 102 10*3/uL — ABNORMAL LOW (ref 150–400)
RBC: 4.42 MIL/uL (ref 3.87–5.11)
RDW: 13.3 % (ref 11.5–15.5)
WBC: 6 10*3/uL (ref 4.0–10.5)
nRBC: 0 % (ref 0.0–0.2)

## 2022-07-13 LAB — COMPREHENSIVE METABOLIC PANEL
ALT: 40 U/L (ref 0–44)
AST: 52 U/L — ABNORMAL HIGH (ref 15–41)
Albumin: 4.1 g/dL (ref 3.5–5.0)
Alkaline Phosphatase: 68 U/L (ref 38–126)
Anion gap: 10 (ref 5–15)
BUN: 23 mg/dL (ref 8–23)
CO2: 28 mmol/L (ref 22–32)
Calcium: 10.2 mg/dL (ref 8.9–10.3)
Chloride: 99 mmol/L (ref 98–111)
Creatinine, Ser: 1.12 mg/dL — ABNORMAL HIGH (ref 0.44–1.00)
GFR, Estimated: 51 mL/min — ABNORMAL LOW (ref >60)
Glucose, Bld: 90 mg/dL (ref 70–99)
Potassium: 3.4 mmol/L — ABNORMAL LOW (ref 3.5–5.1)
Sodium: 137 mmol/L (ref 135–145)
Total Bilirubin: 0.8 mg/dL (ref 0.3–1.2)
Total Protein: 7.7 g/dL (ref 6.5–8.1)

## 2022-07-13 MED ORDER — HEPARIN SOD (PORK) LOCK FLUSH 100 UNIT/ML IV SOLN
500.0000 [IU] | Freq: Once | INTRAVENOUS | Status: AC | PRN
  Administered 2022-07-13: 500 [IU]

## 2022-07-13 MED ORDER — SODIUM CHLORIDE 0.9 % IV SOLN: Freq: Once | INTRAVENOUS | Status: AC

## 2022-07-13 MED ORDER — SODIUM CHLORIDE 0.9 % IV SOLN
200.0000 mg | Freq: Once | INTRAVENOUS | Status: AC
  Administered 2022-07-13: 200 mg via INTRAVENOUS
  Filled 2022-07-13: qty 8

## 2022-07-13 MED ORDER — POTASSIUM CHLORIDE CRYS ER 20 MEQ PO TBCR
40.0000 meq | EXTENDED_RELEASE_TABLET | Freq: Once | ORAL | Status: AC
  Administered 2022-07-13: 40 meq via ORAL

## 2022-07-13 MED ORDER — SODIUM CHLORIDE 0.9% FLUSH
10.0000 mL | INTRAVENOUS | Status: DC | PRN
  Administered 2022-07-13: 10 mL

## 2022-07-13 NOTE — Patient Instructions (Signed)
[facility]  Discharge Instructions: Thank you for choosing [facility] to provide your oncology and hematology care.  If you have a lab appointment with the [facility], please come in thru the Main Entrance and check in at the main information desk.  Wear comfortable clothing and clothing appropriate for easy access to any Portacath or PICC line.   We strive to give you quality time with your provider. You may need to reschedule your appointment if you arrive late (15 or more minutes).  Arriving late affects you and other patients whose appointments are after yours.  Also, if you miss three or more appointments without notifying the office, you may be dismissed from the clinic at the provider's discretion.      For prescription refill requests, have your pharmacy contact our office and allow 72 hours for refills to be completed.    Today you received the following chemotherapy and/or immunotherapy agents Keytruda, return as scheduled.   To help prevent nausea and vomiting after your treatment, we encourage you to take your nausea medication as directed.  BELOW ARE SYMPTOMS THAT SHOULD BE REPORTED IMMEDIATELY: *FEVER GREATER THAN 100.4 F (38 C) OR HIGHER *CHILLS OR SWEATING *NAUSEA AND VOMITING THAT IS NOT CONTROLLED WITH YOUR NAUSEA MEDICATION *UNUSUAL SHORTNESS OF BREATH *UNUSUAL BRUISING OR BLEEDING *URINARY PROBLEMS (pain or burning when urinating, or frequent urination) *BOWEL PROBLEMS (unusual diarrhea, constipation, pain near the anus) TENDERNESS IN MOUTH AND THROAT WITH OR WITHOUT PRESENCE OF ULCERS (sore throat, sores in mouth, or a toothache) UNUSUAL RASH, SWELLING OR PAIN  UNUSUAL VAGINAL DISCHARGE OR ITCHING   Items with * indicate a potential emergency and should be followed up as soon as possible or go to the Emergency Department if any problems should occur.  Please show the CHEMOTHERAPY ALERT CARD or IMMUNOTHERAPY ALERT CARD at  check-in to the Emergency Department and triage nurse.  Should you have questions after your visit or need to cancel or reschedule your appointment, please contact [facility] [phone]  and follow the prompts.  Office hours are 8:00 a.m. to 4:30 p.m. Monday - Friday. Please note that voicemails left after 4:00 p.m. may not be returned until the following business day.  We are closed weekends and major holidays. You have access to a nurse at all times for urgent questions. Please call the main number to the clinic [phone] and follow the prompts.  For any non-urgent questions, you may also contact your provider using MyChart. We now offer e-Visits for anyone 104 and older to request care online for non-urgent symptoms. For details visit [email or web address].   Also download the MyChart app! Go to the app store, search "MyChart", open the app, select [facility], and log in with your MyChart username and password.  Masks are optional in the cancer centers. If you would like for your care team to wear a mask while they are taking care of you, please let them know. For doctor visits, patients may have with them one support person who is at least 76 years old. At this time, visitors are not allowed in the infusion area.

## 2022-07-13 NOTE — Progress Notes (Signed)
Patient presents today for Keytruda, potassium 3.4 40 mEq of potassium chloride given per standing orders. Patient tolerated therapy with no complaints voiced. Side effects with management reviewed with understanding verbalized. Port site clean and dry with no bruising or swelling noted at site. Good blood return noted before and after administration of therapy. Band aid applied. Patient left in satisfactory condition with VSS and no s/s of distress noted.

## 2022-07-26 ENCOUNTER — Ambulatory Visit (HOSPITAL_COMMUNITY)
Admission: RE | Admit: 2022-07-26 | Discharge: 2022-07-26 | Disposition: A | Discharge status: Home / Self Care | Payer: Medicare HMO | Source: Ambulatory Visit | Attending: Hematology | Admitting: Hematology

## 2022-07-26 DIAGNOSIS — C189 Malignant neoplasm of colon, unspecified: Secondary | ICD-10-CM | POA: Insufficient documentation

## 2022-07-26 DIAGNOSIS — N858 Other specified noninflammatory disorders of uterus: Secondary | ICD-10-CM | POA: Diagnosis not present

## 2022-07-26 DIAGNOSIS — I700 Atherosclerosis of aorta: Secondary | ICD-10-CM | POA: Diagnosis not present

## 2022-07-26 DIAGNOSIS — R918 Other nonspecific abnormal finding of lung field: Secondary | ICD-10-CM | POA: Diagnosis not present

## 2022-07-26 DIAGNOSIS — N3289 Other specified disorders of bladder: Secondary | ICD-10-CM | POA: Diagnosis not present

## 2022-07-26 DIAGNOSIS — D7389 Other diseases of spleen: Secondary | ICD-10-CM | POA: Diagnosis not present

## 2022-07-26 DIAGNOSIS — I2510 Atherosclerotic heart disease of native coronary artery without angina pectoris: Secondary | ICD-10-CM | POA: Diagnosis not present

## 2022-07-26 MED ORDER — IOHEXOL 300 MG/ML  SOLN
100.0000 mL | Freq: Once | INTRAMUSCULAR | Status: AC | PRN
  Administered 2022-07-26: 100 mL via INTRAVENOUS

## 2022-08-01 NOTE — Progress Notes (Addendum)
[facility] Note  08/05/2022    CHIEF COMPLAINT Patient presents for Retina Follow Up  HISTORY OF PRESENT ILLNESS: Gabrielle [name] is a 76 y.o. female who presents to the clinic today for:   HPI     Retina Follow Up   Patient presents with  Wet AMD (IVE OS 07.27.23).  In both eyes.  Severity is moderate.  Duration of 5 weeks.  Since onset it is stable.  I, the attending physician,  performed the HPI with the patient and updated documentation appropriately.        Comments   Patient feels that the vision is the same as it was 5 weeks ago. She is complaining of crusty eyelids this morning.       Last edited by Bernarda Caffey, MD on 08/05/2022 11:55 AM.    Pt states vision is the same, she had a cancer treatment the other day and had a CT scan, her dr said everything looked good, except her BP was up  Referring physician: Janora Norlander, DO [place],  [place] [address]  HISTORICAL INFORMATION:   Selected notes from the [facility] Referred by Dr. Cristela Blue for concern of ARMD OU   CURRENT MEDICATIONS: Current Outpatient Medications (Ophthalmic Drugs)  Medication Sig   dorzolamide-timolol (COSOPT) 22.3-6.8 MG/ML ophthalmic solution INSTILL ONE DROP IN [facility] EYE TWICE DAILY   No current facility-administered medications for this visit. (Ophthalmic Drugs)   Current Outpatient Medications (Other)  Medication Sig   alendronate (FOSAMAX) 70 MG tablet TAKE ONE TABLET EVERY 7 DAYS ON AN EMPTY STOMACH WITH A FULL GLASS OF WATER   amLODipine (NORVASC) 10 MG tablet Take 1 tablet (10 mg total) by mouth daily. For blood pressure   atorvastatin (LIPITOR) 20 MG tablet Take 1 tablet (20 mg total) by mouth daily. For cholesterol   cholecalciferol (VITAMIN D3) 25 MCG (1000 UT) tablet Take 1,000 Units by mouth daily.   CRANBERRY FRUIT PO Take 1 tablet by mouth daily.   diclofenac Sodium (VOLTAREN) 1 % GEL APPLY 4 GRAMS TO AFFECTED AREA(S) 4  TIMES A DAY   ELIQUIS 5 MG TABS tablet TAKE ONE TABLET TWICE DAILY   FEROSUL 325 (65 Fe) MG tablet TAKE  (1)  TABLET TWICE A DAY WITH MEALS (BREAKFAST AND SUPPER)   Garlic 403 MG TABS Take by mouth.   lidocaine-prilocaine (EMLA) cream Apply a small amount to port a cath site and cover with plastic wrap 1 hour prior to chemotherapy appointments (Patient not taking: Reported on 08/03/2022)   losartan-hydrochlorothiazide (HYZAAR) 100-25 MG tablet TAKE 1 TABLET DAILY   potassium chloride SA (KLOR-CON M) 20 MEQ tablet Take 1 tablet (20 mEq total) by mouth 3 (three) times daily.   prochlorperazine (COMPAZINE) 10 MG tablet Take 1 tablet (10 mg total) by mouth every 6 (six) hours as needed for nausea or vomiting. (Patient not taking: Reported on 08/03/2022)   No current facility-administered medications for this visit. (Other)   REVIEW OF SYSTEMS: ROS   Positive for: Gastrointestinal, Genitourinary, Musculoskeletal, Cardiovascular, Eyes Negative for: Constitutional, Neurological, Skin, HENT, Endocrine, Respiratory, Psychiatric, Allergic/Imm, Heme/Lymph Last edited by Annie Paras, COT on 08/05/2022  8:10 AM.     ALLERGIES Allergies  Allergen Reactions   Lisinopril Cough   PAST MEDICAL HISTORY Past Medical History:  Diagnosis Date   Arthritis    Cancer [facility])    Cataract    OU   Colon cancer ([place])  Family history of pancreatic cancer 08/21/2020   Family history of uterine cancer 08/21/2020   H/O cesarean section    Hx of tonsillectomy    Hyperlipidemia    Hypertension    Macular degeneration    Exu ARMD OU   Paroxysmal atrial fibrillation ([place]) 07/05/2020   Past Surgical History:  Procedure Laterality Date   BIOPSY  04/06/2020   Procedure: BIOPSY;  Surgeon: Rogene Houston, MD;  Location: [facility];  Service: Endoscopy;;   CARPAL TUNNEL RELEASE Right    CATARACT EXTRACTION W/PHACO Left 10/11/2019   Procedure: CATARACT EXTRACTION PHACO AND INTRAOCULAR LENS PLACEMENT ([place]);   Surgeon: Baruch Goldmann, MD;  Location: [facility] ORS;  Service: Ophthalmology;  Laterality: Left;  CDE: 8.56   CATARACT EXTRACTION W/PHACO Right 10/25/2019   Procedure: CATARACT EXTRACTION PHACO AND INTRAOCULAR LENS PLACEMENT (I[facility]);  Surgeon: Baruch Goldmann, MD;  Location: [facility] ORS;  Service: Ophthalmology;  Laterality: Right;  CDE: 5.67   CESAREAN SECTION     COLONOSCOPY N/A 04/06/2020   Procedure: COLONOSCOPY;  Surgeon: Rogene Houston, MD;  Location: [facility];  Service: Endoscopy;  Laterality: N/A;   CYSTOSCOPY WITH BIOPSY N/A 04/08/2020   Procedure: CYSTOSCOPY WITH BIOPSY;  Surgeon: Cleon Gustin, MD;  Location: [facility] ORS;  Service: Urology;  Laterality: N/A;   ESOPHAGOGASTRODUODENOSCOPY N/A 04/05/2020   Procedure: ESOPHAGOGASTRODUODENOSCOPY (EGD);  Surgeon: Rogene Houston, MD;  Location: [facility];  Service: Endoscopy;  Laterality: N/A;   PARTIAL COLECTOMY N/A 04/08/2020   Procedure: PARTIAL COLECTOMY;  Surgeon: Virl Cagey, MD;  Location: [facility] ORS;  Service: General;  Laterality: N/A;   PORTACATH PLACEMENT Left 05/18/2020   Procedure: INSERTION PORT-A-CATH (ATTACHED CATHETER IN LEFT SUBCLAVIAN);  Surgeon: Virl Cagey, MD;  Location: [facility] OR[facility];  Service: General;  Laterality: Left;   TONSILLECTOMY     FAMILY HISTORY Family History  Problem Relation Age of Onset   Macular degeneration Mother    Stroke Mother    Hypertension Mother    Dementia Mother    Lung cancer Father        dx late 74s; smoking hx   Diabetes Sister    Hypertension Sister    Leukemia Paternal Uncle        d. 22s   Cancer Maternal Aunt        ovarian or endometrial dx 68s   Pancreatic cancer Paternal Uncle        d. late 36s   SOCIAL HISTORY Social History   Tobacco Use   Smoking status: Never   Smokeless tobacco: Never  Vaping Use   Vaping Use: Never used  Substance Use Topics   Alcohol use: No   Drug use: No       OPHTHALMIC EXAM: Base Eye Exam     Visual Acuity (Snellen - Linear)        Right Left   Dist cc CF at 3' 20/70   Dist ph cc NI NI    Correction: Glasses         Tonometry (Tonopen, 8:14 AM)       Right Left   Pressure 19 15         Pupils       Dark Light Shape React APD   Right 3 2 Round Brisk None   Left 3 2 Round Brisk None         Visual Fields       Left Right    Full Full  Extraocular Movement       Right Left    Full, Ortho Full, Ortho         Neuro/Psych     Oriented x3: Yes   Mood/Affect: Normal         Dilation     Both eyes: 2.5% Phenylephrine @ 8:11 AM           Slit Lamp and Fundus Exam     Slit Lamp Exam       Right Left   Lids/Lashes Dermatochalasis - upper lid Dermatochalasis - upper lid   Conjunctiva/Sclera 1+ Injection Trace Injection   Cornea Arcus, 3+ fine Punctpate epithelial erosions Arcus, 3+ fine Punctate epithelial erosions, mild tear film debris   Anterior Chamber Deep and quiet Deep and quiet   Iris Round and well dilated Round and well dilated   Lens PCIOL in good position, 1+ Posterior capsular opacification PC IOL in good position with open PC   Anterior Vitreous Vitreous syneresis, Posterior vitreous detachment, vitreous condensations Vitreous syneresis, Posterior vitreous detachment, silicone oil micro drops         Fundus Exam       Right Left   Disc pink and sharp, compact mild pallor, sharp, Compact, vascular loops superiorly   C/D Ratio 0.2 0.2   Macula Blunted foveal reflex, Drusen, Pigment clumping and atrophy, central thickening/pigmented disciform scar, +PED, punctate IRH superior macula within atrophy, persistent, trace cystic changes overlying central scar - stable Blunted foveal reflex, central thickening, RPE clumping and atrophy, Drusen, trace cystic changes overlying PED - persistent / slightly improved, no heme   Vessels Vascular attenuation, Tortuous Vascular attenuation, Tortuous   Periphery Attached, mild Reticular degeneration Attached, mild  Reticular degeneration           Refraction     Wearing Rx       Sphere Cylinder Add   Right [place] Sphere +3.50   Left -0.50 Sphere +3.50    Type: PAL           IMAGING AND PROCEDURES  Imaging and Procedures for 03/21/18  OCT, Retina - OU - Both Eyes       Right Eye Quality was good. Central Foveal Thickness: 553. Progression has been stable. Findings include no SRF, abnormal foveal contour, retinal drusen , outer retinal tubulation, subretinal hyper-reflective material, disciform scar, epiretinal membrane, intraretinal fluid, pigment epithelial detachment, outer retinal atrophy (Stable sub-retinal scar, mild persistent cystic changes overlying).   Left Eye Quality was good. Central Foveal Thickness: 329. Progression has been stable. Findings include no SRF, abnormal foveal contour, retinal drusen , subretinal hyper-reflective material, intraretinal fluid, pigment epithelial detachment, outer retinal atrophy (Persistent cystic changes overlying PED/SRHM -- slightly improved).   Notes *Images captured and stored on drive  Diagnosis / Impression:  Exudative ARMD OU OD: Stable sub-retinal scar, mild persistent cystic changes overlying OS: Persistent cystic changes overlying PED/SRHM -- slightly improved  Clinical management:  See below  Abbreviations: NFP - Normal foveal profile. CME - cystoid macular edema. PED - pigment epithelial detachment. IRF - intraretinal fluid. SRF - subretinal fluid. EZ - ellipsoid zone. ERM - epiretinal membrane. ORA - outer retinal atrophy. ORT - outer retinal tubulation. SRHM - subretinal hyper-reflective material      Intravitreal Injection, Pharmacologic Agent - OS - Left Eye       Time Out 08/05/2022. 9:36 AM. Confirmed correct patient, procedure, site, and patient consented.   Anesthesia Topical anesthesia was used. Anesthetic medications included Lidocaine 2%, Proparacaine  0.5%.   Procedure Preparation included 5% betadine to  ocular surface, eyelid speculum. A (32g) needle was used.   Injection: 2 mg aflibercept 2 MG/0.05ML   Route: Intravitreal, Site: Left Eye   NDC: A3590391, Lot: [id number], Expiration date: 10/05/2023, Waste: 0 mL   Post-op Post injection exam found visual acuity of at least counting fingers. The patient tolerated the procedure well. There were no complications. The patient received written and verbal post procedure care education. Post injection medications were not given.            ASSESSMENT/PLAN:    ICD-10-CM   1. Exudative age-related macular degeneration of both eyes with active choroidal neovascularization (HCC)  H35.3231 OCT, Retina - OU - Both Eyes    Intravitreal Injection, Pharmacologic Agent - OS - Left Eye    aflibercept (EYLEA) SOLN 2 mg    2. Posterior vitreous detachment of both eyes  H43.813     3. Essential hypertension  I10     4. Hypertensive retinopathy of both eyes  H35.033     5. Pseudophakia of both eyes  Z96.1     6. Ocular hypertension, bilateral  H40.053     7. Dry eyes  H04.123      1. Exudative age-related macular degeneration, both eyes  - severe exudative disease with very active CNVM OU at presentation in January 2019  - S/P IVA OD #1 (01.04.19), #2 (02.15.19)  - S/P IVA OS #1 (01.18.19), #2 (02.15.19)  - switched to Eylea 3.18.19 due to severity of disease  - S/P IVE OD #1 (03.18.19), #2 (04.17.19), #3 (05.15.19), #4 (10.02.19),  #5 (01.15.20), #6 (06.04.20), #7 (10.29.20), #8 (01.21.21), #9 (05.27.21), #10 (07.22.21) -- injections held due to stable disciform scar  - S/P IVE OS #1 (03.18.19), #2 (04.17.19), #3 (05.15.19), #4 (06.12.19), #5 (07.10.19), #6 (08.07.19), #7 (09.04.19), #8 (10.02.19), #9 (11.06.19), #10 (12.11.19), #11 (01.15.20), #12 (02.20.20), #13 (03.26.20), #14 (04.30.20), #15 (06.04.20), #16 (07.16.20), # 17 (08.20.20), #18 (09.24.20), #19 (10.29.20), #20 (12.03.20), #21 (01.21.21), #22 (03.18.21), #23 (05.27.21), #24  (07.22.21), #25 (11.11.21), #26 (12.09.21), #27 (01.06.22), #28 (2.7.22), #29 (03.24.22), #30 (4.28.22), #31 (05.31.22), #32 (06.30.22), #33 (08.04.22), #34 (09.15.22), #35 (10.27.22), #36 (12.08.22), #37 (01.12.23), #38 (02.16.23), #39 (03.30.23), #40 (05.11.23), #41 (06.22.23), #42 (07.27.23)  - IVE OS held from 7.22.21 to 11.11.21 due to TIA/stroke on 8.1.21  - OCT OD: Stable sub-retinal scar, mild persistent cystic changes overlying; OS: Persistent cystic changes overlying PED/SRHM -- slightly improved  - exam OS shows faint IRH nasal fovea stably resolved, no heme; OD with new IRH superior macula within atrophy  - BCVA OD stable at CF 3'; OS 20/70 -- stable  - recommend IVE OS #43 today, 09.01.23 w/ f/u 5 wks - will cont to hold OD  - Eylea informed consent form signed and scanned on 01.21.2021  - Good Days approved 12/05/2020 - 12/04/2022  - f/u in 5 weeks, sooner prn for DFE/OCT/possible injection(s)  2. PVD / vitreous syneresis  - Discussed findings and prognosis   - No RT or RD on 360 exam  - Reviewed s/s of RT/RD  - Strict return precautions for any such RT/RD signs/symptoms   3,4. Hypertensive retinopathy OU - pt reports spike in blood pressure at last [place] visit - exam shows new focal IRH OD within GA - discussed importance of tight BP control - monitor  5. Pseudophakia OU  - s/p CE/IOL (Dr. Marisa Hua, 11.2020)  - beautiful surgeries w/ IOLs in excellent position,  doing well  - monitor   6. Ocular Hypertension OU  - IOP 20 OU  - cont Cosopt BID OU  7. Dry eyes OU - recommend artificial tears and lubricating ointment as needed  Ophthalmic Meds Ordered this visit:  Meds ordered this encounter  Medications   aflibercept (EYLEA) SOLN 2 mg     Return in about 5 weeks (around 09/09/2022) for f/u exu ARMD OU, DFE, OCT.  There are no Patient Instructions on file for this visit.  This document serves as a record of services personally performed by Gardiner Sleeper,  MD, PhD. It was created on their behalf by San Jetty. Owens Shark, OA an ophthalmic technician. The creation of this record is the provider's dictation and/or activities during the visit.    Electronically signed by: San Jetty. Owens Shark, [place] 08.28.2023 11:59 AM  Gardiner Sleeper, M.D., Ph.D. Diseases & Surgery of the Retina and Vitreous [facility]  I have reviewed the above documentation for accuracy and completeness, and I agree with the above. Gardiner Sleeper, M.D., Ph.D. 08/05/22 11:59 AM    Abbreviations: M myopia (nearsighted); A astigmatism; H hyperopia (farsighted); P presbyopia; Mrx spectacle prescription;  CTL contact lenses; OD right eye; OS left eye; OU both eyes  XT exotropia; ET esotropia; PEK punctate epithelial keratitis; PEE punctate epithelial erosions; DES dry eye syndrome; MGD meibomian gland dysfunction; ATs artificial tears; PFAT's preservative free artificial tears; [place] nuclear sclerotic cataract; PSC posterior subcapsular cataract; ERM epi-retinal membrane; PVD posterior vitreous detachment; RD retinal detachment; DM diabetes mellitus; DR diabetic retinopathy; NPDR non-proliferative diabetic retinopathy; PDR proliferative diabetic retinopathy; CSME clinically significant macular edema; DME diabetic macular edema; dbh dot blot hemorrhages; CWS cotton wool spot; POAG primary open angle glaucoma; C/D cup-to-disc ratio; HVF humphrey visual field; GVF goldmann visual field; OCT optical coherence tomography; IOP intraocular pressure; BRVO Branch retinal vein occlusion; CRVO central retinal vein occlusion; CRAO central retinal artery occlusion; BRAO branch retinal artery occlusion; RT retinal tear; SB scleral buckle; PPV pars plana vitrectomy; VH Vitreous hemorrhage; PRP panretinal laser photocoagulation; IVK intravitreal kenalog; VMT vitreomacular traction; MH Macular hole;  NVD neovascularization of the disc; NVE neovascularization elsewhere; AREDS age related eye disease  study; ARMD age related macular degeneration; POAG primary open angle glaucoma; EBMD epithelial/anterior basement membrane dystrophy; ACIOL anterior chamber intraocular lens; IOL intraocular lens; PCIOL posterior chamber intraocular lens; Phaco/IOL phacoemulsification with intraocular lens placement; [place] photorefractive keratectomy; LASIK laser assisted in situ keratomileusis; HTN hypertension; DM diabetes mellitus; COPD chronic obstructive pulmonary disease

## 2022-08-03 ENCOUNTER — Inpatient Hospital Stay: Payer: Medicare HMO

## 2022-08-03 ENCOUNTER — Inpatient Hospital Stay (HOSPITAL_BASED_OUTPATIENT_CLINIC_OR_DEPARTMENT_OTHER): Payer: Medicare HMO | Admitting: Hematology

## 2022-08-03 VITALS — BP 144/87 | HR 61 | Temp 98.3°F | Resp 20

## 2022-08-03 VITALS — BP 195/112 | HR 70 | Temp 97.9°F | Resp 18 | Ht 62.0 in | Wt 174.1 lb

## 2022-08-03 DIAGNOSIS — C189 Malignant neoplasm of colon, unspecified: Secondary | ICD-10-CM

## 2022-08-03 DIAGNOSIS — C182 Malignant neoplasm of ascending colon: Secondary | ICD-10-CM | POA: Diagnosis not present

## 2022-08-03 DIAGNOSIS — Z5112 Encounter for antineoplastic immunotherapy: Secondary | ICD-10-CM | POA: Diagnosis not present

## 2022-08-03 DIAGNOSIS — E876 Hypokalemia: Secondary | ICD-10-CM

## 2022-08-03 DIAGNOSIS — Z79899 Other long term (current) drug therapy: Secondary | ICD-10-CM | POA: Diagnosis not present

## 2022-08-03 LAB — COMPREHENSIVE METABOLIC PANEL
ALT: 32 U/L (ref 0–44)
AST: 34 U/L (ref 15–41)
Albumin: 4.2 g/dL (ref 3.5–5.0)
Alkaline Phosphatase: 70 U/L (ref 38–126)
Anion gap: 10 (ref 5–15)
BUN: 21 mg/dL (ref 8–23)
CO2: 27 mmol/L (ref 22–32)
Calcium: 9.7 mg/dL (ref 8.9–10.3)
Chloride: 100 mmol/L (ref 98–111)
Creatinine, Ser: 1.06 mg/dL — ABNORMAL HIGH (ref 0.44–1.00)
GFR, Estimated: 55 mL/min — ABNORMAL LOW (ref >60)
Glucose, Bld: 115 mg/dL — ABNORMAL HIGH (ref 70–99)
Potassium: 3.1 mmol/L — ABNORMAL LOW (ref 3.5–5.1)
Sodium: 137 mmol/L (ref 135–145)
Total Bilirubin: 0.7 mg/dL (ref 0.3–1.2)
Total Protein: 7.6 g/dL (ref 6.5–8.1)

## 2022-08-03 LAB — MAGNESIUM: Magnesium: 2.1 mg/dL (ref 1.7–2.4)

## 2022-08-03 LAB — CBC WITH DIFFERENTIAL/PLATELET
Abs Immature Granulocytes: 0.02 10*3/uL (ref 0.00–0.07)
Basophils Absolute: 0 10*3/uL (ref 0.0–0.1)
Basophils Relative: 0 %
Eosinophils Absolute: 0.3 10*3/uL (ref 0.0–0.5)
Eosinophils Relative: 5 %
HCT: 39.3 % (ref 36.0–46.0)
Hemoglobin: 13.4 g/dL (ref 12.0–15.0)
Immature Granulocytes: 0 %
Lymphocytes Relative: 22 %
Lymphs Abs: 1.2 10*3/uL (ref 0.7–4.0)
MCH: 31.6 pg (ref 26.0–34.0)
MCHC: 34.1 g/dL (ref 30.0–36.0)
MCV: 92.7 fL (ref 80.0–100.0)
Monocytes Absolute: 0.5 10*3/uL (ref 0.1–1.0)
Monocytes Relative: 10 %
Neutro Abs: 3.4 10*3/uL (ref 1.7–7.7)
Neutrophils Relative %: 63 %
Platelets: 144 10*3/uL — ABNORMAL LOW (ref 150–400)
RBC: 4.24 MIL/uL (ref 3.87–5.11)
RDW: 13.4 % (ref 11.5–15.5)
WBC: 5.5 10*3/uL (ref 4.0–10.5)
nRBC: 0 % (ref 0.0–0.2)

## 2022-08-03 LAB — TSH: TSH: 2.373 u[IU]/mL (ref 0.350–4.500)

## 2022-08-03 MED ORDER — SODIUM CHLORIDE 0.9 % IV SOLN: Freq: Once | INTRAVENOUS | Status: AC

## 2022-08-03 MED ORDER — SODIUM CHLORIDE 0.9 % IV SOLN
200.0000 mg | Freq: Once | INTRAVENOUS | Status: AC
  Administered 2022-08-03: 200 mg via INTRAVENOUS
  Filled 2022-08-03: qty 8

## 2022-08-03 MED ORDER — CLONIDINE HCL 0.1 MG PO TABS
0.2000 mg | ORAL_TABLET | Freq: Once | ORAL | Status: AC
  Administered 2022-08-03: 0.2 mg via ORAL
  Filled 2022-08-03: qty 2

## 2022-08-03 MED ORDER — SODIUM CHLORIDE 0.9% FLUSH
10.0000 mL | INTRAVENOUS | Status: DC | PRN
  Administered 2022-08-03: 10 mL

## 2022-08-03 MED ORDER — HEPARIN SOD (PORK) LOCK FLUSH 100 UNIT/ML IV SOLN
500.0000 [IU] | Freq: Once | INTRAVENOUS | Status: AC | PRN
  Administered 2022-08-03: 500 [IU]

## 2022-08-03 MED ORDER — POTASSIUM CHLORIDE CRYS ER 20 MEQ PO TBCR
40.0000 meq | EXTENDED_RELEASE_TABLET | Freq: Two times a day (BID) | ORAL | Status: DC
  Administered 2022-08-03: 40 meq via ORAL
  Filled 2022-08-03: qty 2

## 2022-08-03 NOTE — Progress Notes (Signed)
[place], [place] [address]   CLINIC:  Medical Oncology/Hematology  PCP:  Janora Norlander, DO [address] [place] [address] [phone]   REASON FOR VISIT:  Follow-up for right colon cancer  PRIOR THERAPY:  1. Right hemicolectomy on 04/08/2020. 2. FOLFOX x 3 cycles from 05/19/2020 to 07/14/2020.  NGS Results: Foundation 1 KRAS/NRAS wild-type, MSI--high, TMB 55 Muts/Mb, BRAF V 600 E  CURRENT THERAPY: Keytruda every 3 weeks  BRIEF ONCOLOGIC HISTORY:  Oncology History  Malignant neoplasm of colon ([place])  04/07/2020 Initial Diagnosis   Malignant neoplasm of ascending colon ([place])   05/13/2020 Genetic Testing   Foundation One     05/19/2020 - 07/02/2020 Chemotherapy   The patient had palonosetron (ALOXI) injection 0.25 mg, 0.25 mg, Intravenous,  Once, 2 of 12 cycles Administration: 0.25 mg (05/19/2020), 0.25 mg (06/30/2020) leucovorin 522 mg in dextrose 5 % 250 mL infusion, 320 mg/m2 = 522 mg (80 % of original dose 400 mg/m2), Intravenous,  Once, 2 of 12 cycles Dose modification: 320 mg/m2 (80 % of original dose 400 mg/m2, Cycle 1, Reason: Provider Judgment), 810.1751 mg/m2 (66.7 % of original dose 400 mg/m2, Cycle 2, Reason: Provider Judgment) Administration: 522 mg (05/19/2020), 434 mg (06/30/2020) oxaliplatin (ELOXATIN) 110 mg in dextrose 5 % 500 mL chemo infusion, 68 mg/m2 = 110 mg (80 % of original dose 85 mg/m2), Intravenous,  Once, 1 of 1 cycle Dose modification: 68 mg/m2 (80 % of original dose 85 mg/m2, Cycle 1, Reason: Provider Judgment) Administration: 110 mg (05/19/2020) fluorouracil (ADRUCIL) chemo injection 500 mg, 320 mg/m2 = 500 mg (80 % of original dose 400 mg/m2), Intravenous,  Once, 2 of 12 cycles Dose modification: 320 mg/m2 (80 % of original dose 400 mg/m2, Cycle 1, Reason: Provider Judgment), 025.8527 mg/m2 (66.7 % of original dose 400 mg/m2, Cycle 2, Reason: Provider Judgment) Administration: 500 mg (05/19/2020), 450  mg (06/30/2020) fluorouracil (ADRUCIL) 3,150 mg in sodium chloride 0.9 % 87 mL chemo infusion, 1,920 mg/m2 = 3,150 mg (80 % of original dose 2,400 mg/m2), Intravenous, 1 Day/Dose, 2 of 12 cycles Dose modification: 1,920 mg/m2 (80 % of original dose 2,400 mg/m2, Cycle 1, Reason: Provider Judgment), 1,600 mg/m2 (66.7 % of original dose 2,400 mg/m2, Cycle 2, Reason: Provider Judgment) Administration: 3,150 mg (05/19/2020), 2,600 mg (06/30/2020)  for chemotherapy treatment.    08/03/2020 Genetic Testing   Guardant Reveal Testing     08/14/2020 Genetic Testing   No pathogenic variants detected in [facility] Common Hereditary Cancers Panel.  Variant of uncertain significance (VUS) detected in HOXB13 at c.634G>A (p.Ala212Thr). The Common Hereditary Cancers Panel offered by [facility] includes sequencing and/or deletion duplication testing of the following 48 genes: APC, ATM, AXIN2, BARD1, BMPR1A, BRCA1, BRCA2, BRIP1, CDH1, CDK4, CDKN2A (p14ARF), CDKN2A (p16INK4a), CHEK2, CTNNA1, DICER1, EPCAM (Deletion/duplication testing only), FLCN, GREM1 (promoter region deletion/duplication testing only), KIT, MEN1, MLH1, MSH2, MSH3, MSH6, MUTYH, NBN, NF1, NHTL1, PALB2, PDGFRA, PMS2, POLD1, POLE, PTEN, RAD50, RAD51C, RAD51D, RNF43, SDHB, SDHC, SDHD, SMAD4, SMARCA4. STK11, TP53, TSC1, TSC2, and VHL.  The following genes were evaluated for sequence changes only: SDHA and HOXB13 c.251G>A variant only. The report date is August 14, 2020.    11/05/2020 Cancer Staging   Staging form: Colon and Rectum, AJCC 8th Edition - Pathologic stage from 11/05/2020: Stage IVC (rpTX, pN0, pM1c) - Signed by Derek Jack, MD on 11/05/2020   11/10/2020 - 07/13/2022 Chemotherapy   Patient is on Treatment Plan : COLORECTAL Pembrolizumab q21d  11/10/2020 -  Chemotherapy   Patient is on Treatment Plan : COLORECTAL Pembrolizumab (200) q21d       CANCER STAGING:  Cancer Staging  Malignant neoplasm of colon [facility]) Staging form: Colon and  Rectum, AJCC 8th Edition - Clinical stage from 04/27/2020: Stage IIIB (cT4a, cN1a, cM0) - Unsigned - Pathologic stage from 11/05/2020: Stage IVC (rpTX, pN0, pM1c) - Signed by Derek Jack, MD on 11/05/2020   INTERVAL HISTORY:  [name], a 76 y.o. female, seen for follow-up of metastatic colon cancer.  She is tolerating Keytruda very well.  Energy levels are 100%.  Chronic cough is stable.  Rash on the right arm is stable.  She is applying steroid cream which is helping with itching.  No new onset pains.   REVIEW OF SYSTEMS:  Review of Systems  Constitutional:  Negative for appetite change, fatigue and unexpected weight change.  HENT:   Negative for nosebleeds.   Respiratory:  Positive for cough. Negative for hemoptysis.   Gastrointestinal:  Negative for blood in stool.  Genitourinary:  Negative for hematuria.   Skin:  Positive for rash (R arm stable).  Neurological:  Positive for headaches.  Hematological:  Does not bruise/bleed easily.  All other systems reviewed and are negative.   PAST MEDICAL/SURGICAL HISTORY:  Past Medical History:  Diagnosis Date   Arthritis    Cancer [facility])    Cataract    OU   Colon cancer ([place])    Family history of pancreatic cancer 08/21/2020   Family history of uterine cancer 08/21/2020   H/O cesarean section    Hx of tonsillectomy    Hyperlipidemia    Hypertension    Macular degeneration    Exu ARMD OU   Paroxysmal atrial fibrillation ([place]) 07/05/2020   Past Surgical History:  Procedure Laterality Date   BIOPSY  04/06/2020   Procedure: BIOPSY;  Surgeon: Rogene [place], MD;  Location: [facility];  Service: Endoscopy;;   CARPAL TUNNEL RELEASE Right    CATARACT EXTRACTION W/PHACO Left 10/11/2019   Procedure: CATARACT EXTRACTION PHACO AND INTRAOCULAR LENS PLACEMENT ([place]);  Surgeon: Baruch Goldmann, MD;  Location: [facility] ORS;  Service: Ophthalmology;  Laterality: Left;  CDE: 8.56   CATARACT EXTRACTION W/PHACO Right 10/25/2019    Procedure: CATARACT EXTRACTION PHACO AND INTRAOCULAR LENS PLACEMENT ([facility]);  Surgeon: Baruch Goldmann, MD;  Location: [facility] ORS;  Service: Ophthalmology;  Laterality: Right;  CDE: 5.67   CESAREAN SECTION     COLONOSCOPY N/A 04/06/2020   Procedure: COLONOSCOPY;  Surgeon: Rogene [place], MD;  Location: [facility] ENDO SUITE;  Service: Endoscopy;  Laterality: N/A;   CYSTOSCOPY WITH BIOPSY N/A 04/08/2020   Procedure: CYSTOSCOPY WITH BIOPSY;  Surgeon: Cleon Gustin, MD;  Location: [facility] ORS;  Service: Urology;  Laterality: N/A;   ESOPHAGOGASTRODUODENOSCOPY N/A 04/05/2020   Procedure: ESOPHAGOGASTRODUODENOSCOPY (EGD);  Surgeon: Rogene [place], MD;  Location: [facility];  Service: Endoscopy;  Laterality: N/A;   PARTIAL COLECTOMY N/A 04/08/2020   Procedure: PARTIAL COLECTOMY;  Surgeon: Virl Cagey, MD;  Location: [facility] ORS;  Service: General;  Laterality: N/A;   PORTACATH PLACEMENT Left 05/18/2020   Procedure: INSERTION PORT-A-CATH (ATTACHED CATHETER IN LEFT SUBCLAVIAN);  Surgeon: Virl Cagey, MD;  Location: [facility] ORS;  Service: General;  Laterality: Left;   TONSILLECTOMY      SOCIAL HISTORY:  Social History   Socioeconomic History   Marital status: Divorced    Spouse name: Not on file   Number of children: Not on  file   Years of education: Not on file   Highest education level: Not on file  Occupational History   Not on file  Tobacco Use   Smoking status: Never   Smokeless tobacco: Never  Vaping Use   Vaping Use: Never used  Substance and Sexual Activity   Alcohol use: No   Drug use: No   Sexual activity: Not Currently  Other Topics Concern   Not on file  Social History Narrative   Not on file   Social Determinants of Health   Financial Resource Strain: Low Risk  (02/21/2022)   Overall Financial Resource Strain (CARDIA)    Difficulty of Paying Living Expenses: Not hard at all  Food Insecurity: No Food Insecurity (02/21/2022)   Hunger Vital Sign    Worried About Running Out of Food  in the Last Year: Never true    Ran Out of Food in the Last Year: Never true  Transportation Needs: No Transportation Needs (02/21/2022)   PRAPARE - Hydrologist (Medical): No    Lack of Transportation (Non-Medical): No  Physical Activity: Sufficiently Active (02/21/2022)   Exercise Vital Sign    Days of Exercise per Week: 5 days    Minutes of Exercise per Session: 30 min  Stress: No Stress Concern Present (02/21/2022)   [place]    Feeling of Stress : Not at all  Social Connections: Moderately Isolated (02/21/2022)   Social Connection and Isolation Panel [NHANES]    Frequency of Communication with Friends and Family: More than three times a week    Frequency of Social Gatherings with Friends and Family: More than three times a week    Attends Religious Services: 1 to 4 times per year    Active Member of [employer] or Organizations: No    Attends Archivist Meetings: Never    Marital Status: Divorced  Human resources officer Violence: Not At Risk (02/21/2022)   Humiliation, Afraid, Rape, and Kick questionnaire    Fear of Current or Ex-Partner: No    Emotionally Abused: No    Physically Abused: No    Sexually Abused: No    FAMILY HISTORY:  Family History  Problem Relation Age of Onset   Macular degeneration Mother    Stroke Mother    Hypertension Mother    Dementia Mother    Lung cancer Father        dx late 60s; smoking hx   Diabetes Sister    Hypertension Sister    Leukemia Paternal Uncle        d. 63s   Cancer Maternal Aunt        ovarian or endometrial dx 80s   Pancreatic cancer Paternal Uncle        d. late 63s    CURRENT MEDICATIONS:  Current Outpatient Medications  Medication Sig Dispense Refill   alendronate (FOSAMAX) 70 MG tablet TAKE ONE TABLET EVERY 7 DAYS ON AN EMPTY STOMACH WITH A FULL GLASS OF WATER 12 tablet 0   amLODipine (NORVASC) 10 MG tablet Take 1 tablet (10  mg total) by mouth daily. For blood pressure 90 tablet 3   atorvastatin (LIPITOR) 20 MG tablet Take 1 tablet (20 mg total) by mouth daily. For cholesterol 90 tablet 3   cholecalciferol (VITAMIN D3) 25 MCG (1000 UT) tablet Take 1,000 Units by mouth daily.     CRANBERRY FRUIT PO Take 1 tablet by mouth daily.  diclofenac Sodium (VOLTAREN) 1 % GEL APPLY 4 GRAMS TO AFFECTED AREA(S) 4 TIMES A DAY 400 g 2   dorzolamide-timolol (COSOPT) 22.3-6.8 MG/ML ophthalmic solution INSTILL ONE DROP IN EACH EYE TWICE DAILY 10 mL 3   ELIQUIS 5 MG TABS tablet TAKE ONE TABLET TWICE DAILY 60 tablet 6   FEROSUL 325 (65 Fe) MG tablet TAKE  (1)  TABLET TWICE A DAY WITH MEALS (BREAKFAST AND SUPPER) 710 tablet 0   Garlic 626 MG TABS Take by mouth.     losartan-hydrochlorothiazide (HYZAAR) 100-25 MG tablet TAKE 1 TABLET DAILY 90 tablet 3   potassium chloride SA (KLOR-CON M) 20 MEQ tablet Take 1 tablet (20 mEq total) by mouth 3 (three) times daily. 90 tablet 3   lidocaine-prilocaine (EMLA) cream Apply a small amount to port a cath site and cover with plastic wrap 1 hour prior to chemotherapy appointments (Patient not taking: Reported on 08/03/2022) 30 g 0   prochlorperazine (COMPAZINE) 10 MG tablet Take 1 tablet (10 mg total) by mouth every 6 (six) hours as needed for nausea or vomiting. (Patient not taking: Reported on 08/03/2022) 30 tablet 0   No current facility-administered medications for this visit.   Facility-Administered Medications Ordered in Other Visits  Medication Dose Route Frequency Provider Last Rate Last Admin   potassium chloride SA (KLOR-CON M) CR tablet 40 mEq  40 mEq Oral BID Derek Jack, MD   40 mEq at 08/03/22 1106   sodium chloride flush (NS) 0.9 % injection 10 mL  10 mL Intracatheter PRN Derek Jack, MD   10 mL at 08/03/22 1225    ALLERGIES:  Allergies  Allergen Reactions   Lisinopril Cough    PHYSICAL EXAM:  Performance status (ECOG): 1 - Symptomatic but completely  ambulatory  Vitals:   08/03/22 1029  BP: (!) 195/112  Pulse: 70  Resp: 18  Temp: 97.9 F (36.6 C)  SpO2: 95%   Wt Readings from Last 3 Encounters:  08/03/22 174 lb 1.6 oz (79 kg)  07/13/22 173 lb 4.5 oz (78.6 kg)  06/22/22 172 lb 14.4 oz (78.4 kg)   Physical Exam Vitals reviewed.  Constitutional:      Appearance: Normal appearance. She is obese.  Cardiovascular:     Rate and Rhythm: Normal rate and regular rhythm.     Pulses: Normal pulses.     Heart sounds: Normal heart sounds.  Pulmonary:     Effort: Pulmonary effort is normal.     Breath sounds: Normal breath sounds.  Skin:    Findings: Erythema (R elbow) and rash (R elbow) present. Rash is macular.  Neurological:     General: No focal deficit present.     Mental Status: She is alert and oriented to person, place, and time.  Psychiatric:        Mood and Affect: Mood normal.        Behavior: Behavior normal.     LABORATORY DATA:  I have reviewed the labs as listed.     Latest Ref Rng & Units 08/03/2022    9:49 AM 07/13/2022   12:24 PM 06/22/2022   10:45 AM  CBC  WBC 4.0 - 10.5 K/uL 5.5  6.0  5.9   Hemoglobin 12.0 - 15.0 g/dL 13.4  13.8  13.5   Hematocrit 36.0 - 46.0 % 39.3  41.4  40.6   Platelets 150 - 400 K/uL 144  102  141       Latest Ref Rng & Units 08/03/2022  9:49 AM 07/13/2022   12:24 PM 06/22/2022   10:45 AM  CMP  Glucose 70 - 99 mg/dL 115  90  107   BUN 8 - 23 mg/dL _0 Creatinine 0.44 - 1.00 mg/dL 1.06  1.12  1.19   Sodium 135 - 145 mmol/L 137  137  138   Potassium 3.5 - 5.1 mmol/L 3.1  3.4  3.3   Chloride 98 - 111 mmol/L 100  99  100   CO2 22 - 32 mmol/L _1 Calcium 8.9 - 10.3 mg/dL 9.7  10.2  9.7   Total Protein 6.5 - 8.1 g/dL 7.6  7.7  7.6   Total Bilirubin 0.3 - 1.2 mg/dL 0.7  0.8  0.6   Alkaline Phos 38 - 126 U/L 70  68  64   AST 15 - 41 U/L 34  52  32   ALT 0 - 44 U/L 32  40  34     DIAGNOSTIC IMAGING:  I have independently reviewed the scans and discussed with the  patient. CT CHEST ABDOMEN PELVIS W CONTRAST  Result Date: 07/28/2022 CLINICAL DATA:  76 year old female with history of metastatic colon cancer status post partial colectomy undergoing ongoing chemotherapy. Follow-up study. * Tracking Code: BO * EXAM: CT CHEST, ABDOMEN, AND PELVIS WITH CONTRAST TECHNIQUE: Multidetector CT imaging of the chest, abdomen and pelvis was performed following the standard protocol during bolus administration of intravenous contrast. RADIATION DOSE REDUCTION: This exam was performed according to the departmental dose-optimization program which includes automated exposure control, adjustment of the mA and/or kV according to patient size and/or use of iterative reconstruction technique. CONTRAST:  140m OMNIPAQUE IOHEXOL 300 MG/ML  SOLN COMPARISON:  CT of the chest, abdomen and pelvis 02/08/2022. FINDINGS: CT CHEST FINDINGS Cardiovascular: Heart size is normal. There is no significant pericardial fluid, thickening or pericardial calcification. There is aortic atherosclerosis, as well as atherosclerosis of the great vessels of the mediastinum and the coronary arteries, including calcified atherosclerotic plaque in the left main, left anterior descending and right coronary arteries. Calcifications of the mitral annulus. Mediastinum/Nodes: No pathologically enlarged mediastinal or hilar lymph nodes. Esophagus is unremarkable in appearance. No axillary lymphadenopathy. Lungs/Pleura: A few scattered small pulmonary nodules are noted in the lungs bilaterally, stable in number and size compared to the prior study, with the largest of these measuring 6 x 4 mm (mean diameter 5 mm) in the left lower lobe (axial image 82 of series 3). No other larger more suspicious appearing pulmonary nodules or masses are noted. No acute consolidative airspace disease. No pleural effusions. Scattered areas of mild post infectious or inflammatory scarring are noted throughout the lung bases. Musculoskeletal: There  are no aggressive appearing lytic or blastic lesions noted in the visualized portions of the skeleton. CT ABDOMEN PELVIS FINDINGS Hepatobiliary: Diffuse low attenuation throughout the hepatic parenchyma, indicative of a background of severe hepatic steatosis. No suspicious cystic or solid hepatic lesions. No intra or extrahepatic biliary ductal dilatation. Gallbladder is normal in appearance. Pancreas: No pancreatic mass. No pancreatic ductal dilatation. No pancreatic or peripancreatic fluid collections or inflammatory changes. Spleen: 1.7 cm low-attenuation lesion in the medial aspect of the spleen, stable compared to prior studies, presumably a benign cyst. Adrenals/Urinary Tract: Subcentimeter low-attenuation lesions in both kidneys, too small to characterize, but similar to prior studies and statistically likely to represent tiny cysts (no imaging follow-up is recommended). No hydroureteronephrosis. Bilateral adrenal glands are normal in  appearance. Severe circumferential thickening of the urinary bladder, similar to the prior study. Stomach/Bowel: The appearance of the stomach is normal. There is no pathologic dilatation of small bowel or colon. Status post right hemicolectomy. Vascular/Lymphatic: Aortic atherosclerosis, without evidence of aneurysm or dissection in the abdominal or pelvic vasculature. No lymphadenopathy noted in the abdomen or pelvis. Reproductive: Uterus and ovaries are atrophic. Other: Soft tissue nodularity adjacent to the ileocolic anastomosis in the right upper quadrant (axial image 74 of series 2) measuring 1.2 x 1.7 cm, unchanged to slightly decreased compared to prior studies, likely an area of chronic postoperative scarring. No significant volume of ascites. No pneumoperitoneum. Musculoskeletal: There are no aggressive appearing lytic or blastic lesions noted in the visualized portions of the skeleton. IMPRESSION: 1. Stable examination demonstrating no findings to suggest recurrent or  metastatic disease in the chest, abdomen or pelvis. 2. Previously noted small pulmonary nodules are stable compared to the prior examination, favored to be benign. Continued attention on follow-up studies is recommended. 3. Small amount of soft tissue nodularity near the ileocolic anastomosis, stable to decreased in size compared to prior examinations, presumably mild chronic postoperative scarring. 4. Chronic circumferential bladder wall thickening, stable. This is of uncertain etiology and significance, but urologic consultation should be considered if not already obtained. 5. Aortic atherosclerosis, in addition to left main and 2 vessel coronary artery disease. Assessment for potential risk factor modification, dietary therapy or pharmacologic therapy may be warranted, if clinically indicated. 6. There are calcifications of the mitral annulus. Echocardiographic correlation for evaluation of potential valvular dysfunction may be warranted if clinically indicated. 7. Additional incidental findings, as above. Electronically Signed   By: Vinnie Langton M.D.   On: 07/28/2022 09:05     ASSESSMENT:  1.  Stage IIIb (T4AN1A) poorly differentiated right colon adenocarcinoma: -Right hemicolectomy on 04/07/2020 with poorly differentiated adenocarcinoma, pT4a, positive radial margin, 1/15 lymph nodes positive, loss of MLH1 and PMS2, bladder biopsy negative for malignancy. -CTAP on 04/06/2020 showed circumferential ascending colon mass with numerous borderline enlarged pericolonic lymph nodes.  No findings of hepatic metastatic disease. -CEA on 04/04/2020 was 12.2.  CEA improved to 1.7 on 05/13/2020. -PET scan on 05/11/2020 shows mild FDG uptake associated with the anastomotic site.  Small pulmonary nodules that do not show elevated FDG activity, remain nonspecific, subcentimeter.  Persistent but improved bladder wall thickening. -Cycle 1 of dose reduced FOLFOX on 05/19/2020, followed by 2 hospitalizations, 1 from acute kidney  injury from diarrhea and second admission for C. difficile colitis. -Cycle 2 of 5-FU and leucovorin dose reduced on 06/30/2020.  Chemotherapy discontinued secondary to intolerance. -Follow-up CT scan on 11/02/2020 with multiple soft tissue lesions in the central and right mesentery measuring up to 3.1 x 1.7 cm.  Mild lymphadenopathy in the hepatic duodenal ligament, retroperitoneal space, right common iliac chain, bilateral external iliac chains.  17 mm soft tissue nodule in the lower anterior abdominal wall.  Bilateral tiny pulmonary nodules not substantially changed.  2.9 x 2.5 cm low-density lesion along the posterior uterus is indeterminate. -Pembrolizumab started on 11/10/2020. -CT CAP from 02/09/2021 showed interval near complete resolution of previously demonstrated nodal mass in the ileocolonic mesentery.  Residual ill-defined nodule medial to the tip of the right hepatic lobe.  Stable small pulmonary nodules bilaterally consistent with benign findings.   2.  Family history: -Paternal uncle had colon cancer.  Maternal aunt had brain cancer and another maternal aunt had gynecological malignancy.  Father had lung cancer and was a smoker. -  Genetic testing did not reveal any notable mutations.   3.  Diffuse erythema and nodularity of the dome of the bladder: -CT scan showed severely thickened bladder wall. -Cystoscopy on 04/08/2020 showed diffuse erythema, nodularity in the posterior wall tracking to the dome.  Ureteral orifices were in normal locations.  Biopsies were benign.   PLAN:  1.  Stage IV right colon adenocarcinoma, MSI-high: - CT CAP (07/26/2022): Stable disease with no evidence of new findings in the chest, abdomen or pelvis.  Small pulmonary nodules are stable. - She does not have any immunotherapy related side effects. - Erythematous macular rash on the right arm is also stable. - Reviewed labs today which showed normal LFTs and mildly elevated creatinine.  TSH is 2.3.  CBC was  normal. - Proceed with Keytruda every 3 weeks.  Will check CEA level in the future.  RTC 12 weeks for follow-up.  Plan to repeat scans in 6 months.   2.  Paroxysmal atrial fibrillation: - Continue Eliquis indefinitely.  No bleeding issues.   3.  Hypertension: - Continue Hyzaar and amlodipine.  Today blood pressure is 190/110.  Repeat blood pressure was 182/106.  We have given clonidine 0.2 mg today.  She was told to check blood pressure at home and bring it back to [place] so we can titrate the blood pressure medication.  4.  Hypokalemia: - Continue potassium 20 mEq 3 times daily.   Orders placed this encounter:  Orders Placed This Encounter  Procedures   CBC with Differential   Comprehensive metabolic panel   CBC with Differential   Comprehensive metabolic panel   T4   TSH   CBC with Differential   Comprehensive metabolic panel   CBC with Differential   Comprehensive metabolic panel   T4   TSH   CBC with Differential   Comprehensive metabolic panel   CBC with Differential   Comprehensive metabolic panel   CBC with Differential   Comprehensive metabolic panel   T4   TSH   CBC with Differential   Comprehensive metabolic panel   CBC with Differential   Comprehensive metabolic panel     Derek Jack, MD [place] [phone]

## 2022-08-03 NOTE — Progress Notes (Signed)
Patient has been examined by Dr. Delton Coombes, and vital signs and labs have been reviewed. ANC, Creatinine, LFTs, hemoglobin, and platelets are within treatment parameters per M.D. - pt may proceed with treatment.  Please give clonidine 0.2 mg p.o. x 1. Primary RN and pharmacy notified.

## 2022-08-03 NOTE — Patient Instructions (Signed)
[place] at [facility] Discharge Instructions   You were seen and examined today by Dr. Delton Coombes.  He reviewed the results of your lab work which are normal/stable.   He reviewed the results of your CT scan which was good - the medication is keeping your cancer stable.    We will proceed with your treatment today.   Return as scheduled.    Thank you for choosing [place] at [facility] to provide your oncology and hematology care.  To afford each patient quality time with our provider, please arrive at least 15 minutes before your scheduled appointment time.   If you have a lab appointment with the [facility] please come in thru the Main Entrance and check in at the main information desk.  You need to re-schedule your appointment should you arrive 10 or more minutes late.  We strive to give you quality time with our providers, and arriving late affects you and other patients whose appointments are after yours.  Also, if you no show three or more times for appointments you may be dismissed from the clinic at the providers discretion.     Again, thank you for choosing [facility].  Our hope is that these requests will decrease the amount of time that you wait before being seen by our physicians.       _____________________________________________________________  Should you have questions after your visit to [facility], please contact our office at [phone] and follow the prompts.  Our office hours are 8:00 a.m. and 4:30 p.m. Monday - Friday.  Please note that voicemails left after 4:00 p.m. may not be returned until the following business day.  We are closed weekends and major holidays.  You do have access to a nurse 24-7, just call the main number to the clinic [phone] and do not press any options, hold on the line and a nurse will answer the phone.    For prescription refill requests, have your  pharmacy contact our office and allow 72 hours.    Due to Covid, you will need to wear a mask upon entering the hospital. If you do not have a mask, a mask will be given to you at the Main Entrance upon arrival. For doctor visits, patients may have 1 support person age 20 or older with them. For treatment visits, patients can not have anyone with them due to social distancing guidelines and our immunocompromised population.

## 2022-08-03 NOTE — Patient Instructions (Signed)
[facility]  Discharge Instructions: Thank you for choosing [facility] to provide your oncology and hematology care.  If you have a lab appointment with the Cancer Center, please come in thru the Main Entrance and check in at the main information desk.  Wear comfortable clothing and clothing appropriate for easy access to any Portacath or PICC line.   We strive to give you quality time with your provider. You may need to reschedule your appointment if you arrive late (15 or more minutes).  Arriving late affects you and other patients whose appointments are after yours.  Also, if you miss three or more appointments without notifying the office, you may be dismissed from the clinic at the provider's discretion.      For prescription refill requests, have your pharmacy contact our office and allow 72 hours for refills to be completed.    Today you received the following chemotherapy and/or immunotherapy agents Keytruda, return as scheduled.   To help prevent nausea and vomiting after your treatment, we encourage you to take your nausea medication as directed.  BELOW ARE SYMPTOMS THAT SHOULD BE REPORTED IMMEDIATELY: *FEVER GREATER THAN 100.4 F (38 C) OR HIGHER *CHILLS OR SWEATING *NAUSEA AND VOMITING THAT IS NOT CONTROLLED WITH YOUR NAUSEA MEDICATION *UNUSUAL SHORTNESS OF BREATH *UNUSUAL BRUISING OR BLEEDING *URINARY PROBLEMS (pain or burning when urinating, or frequent urination) *BOWEL PROBLEMS (unusual diarrhea, constipation, pain near the anus) TENDERNESS IN MOUTH AND THROAT WITH OR WITHOUT PRESENCE OF ULCERS (sore throat, sores in mouth, or a toothache) UNUSUAL RASH, SWELLING OR PAIN  UNUSUAL VAGINAL DISCHARGE OR ITCHING   Items with * indicate a potential emergency and should be followed up as soon as possible or go to the Emergency Department if any problems should occur.  Please show the CHEMOTHERAPY ALERT CARD or IMMUNOTHERAPY ALERT CARD at  check-in to the Emergency Department and triage nurse.  Should you have questions after your visit or need to cancel or reschedule your appointment, please contact [facility] [phone]  and follow the prompts.  Office hours are 8:00 a.m. to 4:30 p.m. Monday - Friday. Please note that voicemails left after 4:00 p.m. may not be returned until the following business day.  We are closed weekends and major holidays. You have access to a nurse at all times for urgent questions. Please call the main number to the clinic [phone] and follow the prompts.  For any non-urgent questions, you may also contact your provider using MyChart. We now offer e-Visits for anyone 18 and older to request care online for non-urgent symptoms. For details visit [email or web address].   Also download the MyChart app! Go to the app store, search "MyChart", open the app, select , and log in with your MyChart username and password.  Masks are optional in the cancer centers. If you would like for your care team to wear a mask while they are taking care of you, please let them know. You may have one support person who is at least 76 years old accompany you for your appointments.  

## 2022-08-03 NOTE — Progress Notes (Signed)
Patient presents today for [facility], patient and labs assessed by Dr. Delton Coombes patient okay to proceed with treatment with BP of 182/106, pharmacy aware. Potassium 3.1, 40 mEq of PO potassium ordered per standing orders. 0.2 of clonidine ordered per provider orders. Patient tolerated therapy with no complaints voiced. Side effects with management reviewed with understanding verbalized. Port site clean and dry with no bruising or swelling noted at site. Good blood return noted before and after administration of therapy. Band aid applied. Patient left in satisfactory condition with VSS and no s/s of distress noted.

## 2022-08-04 LAB — CEA: CEA: 1.8 ng/mL (ref 0.0–4.7)

## 2022-08-04 LAB — T4: T4, Total: 7.8 ug/dL (ref 4.5–12.0)

## 2022-08-05 ENCOUNTER — Encounter (INDEPENDENT_AMBULATORY_CARE_PROVIDER_SITE_OTHER): Payer: Self-pay | Admitting: Ophthalmology

## 2022-08-05 ENCOUNTER — Ambulatory Visit (INDEPENDENT_AMBULATORY_CARE_PROVIDER_SITE_OTHER): Payer: Medicare HMO | Admitting: Ophthalmology

## 2022-08-05 DIAGNOSIS — H353231 Exudative age-related macular degeneration, bilateral, with active choroidal neovascularization: Secondary | ICD-10-CM | POA: Diagnosis not present

## 2022-08-05 DIAGNOSIS — H43813 Vitreous degeneration, bilateral: Secondary | ICD-10-CM

## 2022-08-05 DIAGNOSIS — H40053 Ocular hypertension, bilateral: Secondary | ICD-10-CM

## 2022-08-05 DIAGNOSIS — Z961 Presence of intraocular lens: Secondary | ICD-10-CM | POA: Diagnosis not present

## 2022-08-05 DIAGNOSIS — H35033 Hypertensive retinopathy, bilateral: Secondary | ICD-10-CM

## 2022-08-05 DIAGNOSIS — I10 Essential (primary) hypertension: Secondary | ICD-10-CM

## 2022-08-05 DIAGNOSIS — H04123 Dry eye syndrome of bilateral lacrimal glands: Secondary | ICD-10-CM

## 2022-08-05 MED ORDER — AFLIBERCEPT 2MG/0.05ML IZ SOLN FOR KALEIDOSCOPE
2.0000 mg | INTRAVITREAL | Status: AC | PRN
  Administered 2022-08-05: 2 mg via INTRAVITREAL

## 2022-08-24 ENCOUNTER — Inpatient Hospital Stay: Payer: Medicare HMO | Attending: Hematology

## 2022-08-24 ENCOUNTER — Inpatient Hospital Stay: Payer: Medicare HMO

## 2022-08-24 VITALS — BP 188/90 | HR 64 | Temp 97.9°F | Resp 18 | Wt 173.2 lb

## 2022-08-24 DIAGNOSIS — Z5112 Encounter for antineoplastic immunotherapy: Secondary | ICD-10-CM | POA: Insufficient documentation

## 2022-08-24 DIAGNOSIS — C189 Malignant neoplasm of colon, unspecified: Secondary | ICD-10-CM

## 2022-08-24 DIAGNOSIS — C779 Secondary and unspecified malignant neoplasm of lymph node, unspecified: Secondary | ICD-10-CM | POA: Insufficient documentation

## 2022-08-24 DIAGNOSIS — C182 Malignant neoplasm of ascending colon: Secondary | ICD-10-CM | POA: Diagnosis not present

## 2022-08-24 LAB — COMPREHENSIVE METABOLIC PANEL
ALT: 33 U/L (ref 0–44)
AST: 33 U/L (ref 15–41)
Albumin: 4 g/dL (ref 3.5–5.0)
Alkaline Phosphatase: 77 U/L (ref 38–126)
Anion gap: 6 (ref 5–15)
BUN: 18 mg/dL (ref 8–23)
CO2: 29 mmol/L (ref 22–32)
Calcium: 9.5 mg/dL (ref 8.9–10.3)
Chloride: 101 mmol/L (ref 98–111)
Creatinine, Ser: 1.09 mg/dL — ABNORMAL HIGH (ref 0.44–1.00)
GFR, Estimated: 53 mL/min — ABNORMAL LOW (ref >60)
Glucose, Bld: 116 mg/dL — ABNORMAL HIGH (ref 70–99)
Potassium: 3.3 mmol/L — ABNORMAL LOW (ref 3.5–5.1)
Sodium: 136 mmol/L (ref 135–145)
Total Bilirubin: 0.9 mg/dL (ref 0.3–1.2)
Total Protein: 7.5 g/dL (ref 6.5–8.1)

## 2022-08-24 LAB — CBC WITH DIFFERENTIAL/PLATELET
Abs Immature Granulocytes: 0.01 10*3/uL (ref 0.00–0.07)
Basophils Absolute: 0 10*3/uL (ref 0.0–0.1)
Basophils Relative: 1 %
Eosinophils Absolute: 0.3 10*3/uL (ref 0.0–0.5)
Eosinophils Relative: 5 %
HCT: 39 % (ref 36.0–46.0)
Hemoglobin: 13.1 g/dL (ref 12.0–15.0)
Immature Granulocytes: 0 %
Lymphocytes Relative: 20 %
Lymphs Abs: 1.3 10*3/uL (ref 0.7–4.0)
MCH: 31.3 pg (ref 26.0–34.0)
MCHC: 33.6 g/dL (ref 30.0–36.0)
MCV: 93.1 fL (ref 80.0–100.0)
Monocytes Absolute: 0.7 10*3/uL (ref 0.1–1.0)
Monocytes Relative: 11 %
Neutro Abs: 4.1 10*3/uL (ref 1.7–7.7)
Neutrophils Relative %: 63 %
Platelets: 142 10*3/uL — ABNORMAL LOW (ref 150–400)
RBC: 4.19 MIL/uL (ref 3.87–5.11)
RDW: 13.5 % (ref 11.5–15.5)
WBC: 6.4 10*3/uL (ref 4.0–10.5)
nRBC: 0 % (ref 0.0–0.2)

## 2022-08-24 LAB — MAGNESIUM: Magnesium: 2 mg/dL (ref 1.7–2.4)

## 2022-08-24 MED ORDER — SODIUM CHLORIDE 0.9 % IV SOLN: Freq: Once | INTRAVENOUS | Status: AC

## 2022-08-24 MED ORDER — SODIUM CHLORIDE 0.9% FLUSH
10.0000 mL | INTRAVENOUS | Status: DC | PRN
  Administered 2022-08-24: 10 mL

## 2022-08-24 MED ORDER — HEPARIN SOD (PORK) LOCK FLUSH 100 UNIT/ML IV SOLN
500.0000 [IU] | Freq: Once | INTRAVENOUS | Status: AC | PRN
  Administered 2022-08-24: 500 [IU]

## 2022-08-24 MED ORDER — SODIUM CHLORIDE 0.9 % IV SOLN
200.0000 mg | Freq: Once | INTRAVENOUS | Status: AC
  Administered 2022-08-24: 200 mg via INTRAVENOUS
  Filled 2022-08-24: qty 8

## 2022-08-24 NOTE — Progress Notes (Signed)
Labs meet parameters today for treatment. Ok to treat per Dr. Alen Blew.

## 2022-08-24 NOTE — Progress Notes (Signed)
Patient presents today for Keytruda, BP 177/80 patient asymptomatic, Dr. Alen Blew made aware patient okay for treatment per MD.  Patient tolerated therapy with no complaints voiced. Side effects with management reviewed with understanding verbalized. Port site clean and dry with no bruising or swelling noted at site. BP still elevated after treatment, patient denies chest pain or headaches, patient has upcoming PCP visit to discuss BP, patient advised to take BP at home and go to ED if severe headache, blurred vision, or chest pain occurs, patient verbalizes understanding. Good blood return noted before and after administration of therapy. Band aid applied. Patient left in satisfactory condition with VSS and no s/s of distress noted.

## 2022-08-24 NOTE — Patient Instructions (Signed)
[place]  Discharge Instructions: Thank you for choosing [facility] to provide your oncology and hematology care.  If you have a lab appointment with the [facility], please come in thru the Main Entrance and check in at the main information desk.  Wear comfortable clothing and clothing appropriate for easy access to any Portacath or PICC line.   We strive to give you quality time with your provider. You may need to reschedule your appointment if you arrive late (15 or more minutes).  Arriving late affects you and other patients whose appointments are after yours.  Also, if you miss three or more appointments without notifying the office, you may be dismissed from the clinic at the provider's discretion.      For prescription refill requests, have your pharmacy contact our office and allow 72 hours for refills to be completed.    Today you received the following immunotherapy, check BP at home and follow-up with PCP, return as scheduled.   To help prevent nausea and vomiting after your treatment, we encourage you to take your nausea medication as directed.  BELOW ARE SYMPTOMS THAT SHOULD BE REPORTED IMMEDIATELY: *FEVER GREATER THAN 100.4 F (38 C) OR HIGHER *CHILLS OR SWEATING *NAUSEA AND VOMITING THAT IS NOT CONTROLLED WITH YOUR NAUSEA MEDICATION *UNUSUAL SHORTNESS OF BREATH *UNUSUAL BRUISING OR BLEEDING *URINARY PROBLEMS (pain or burning when urinating, or frequent urination) *BOWEL PROBLEMS (unusual diarrhea, constipation, pain near the anus) TENDERNESS IN MOUTH AND THROAT WITH OR WITHOUT PRESENCE OF ULCERS (sore throat, sores in mouth, or a toothache) UNUSUAL RASH, SWELLING OR PAIN  UNUSUAL VAGINAL DISCHARGE OR ITCHING   Items with * indicate a potential emergency and should be followed up as soon as possible or go to the Emergency Department if any problems should occur.  Please show the CHEMOTHERAPY ALERT CARD or IMMUNOTHERAPY ALERT CARD at  check-in to the Emergency Department and triage nurse.  Should you have questions after your visit or need to cancel or reschedule your appointment, please contact [facility] [phone]  and follow the prompts.  Office hours are 8:00 a.m. to 4:30 p.m. Monday - Friday. Please note that voicemails left after 4:00 p.m. may not be returned until the following business day.  We are closed weekends and major holidays. You have access to a nurse at all times for urgent questions. Please call the main number to the clinic [phone] and follow the prompts.  For any non-urgent questions, you may also contact your provider using MyChart. We now offer e-Visits for anyone 71 and older to request care online for non-urgent symptoms. For details visit [email or web address].   Also download the MyChart app! Go to the app store, search "MyChart", open the app, select [facility], and log in with your MyChart username and password.  Masks are optional in the cancer centers. If you would like for your care team to wear a mask while they are taking care of you, please let them know. You may have one support person who is at least 76 years old accompany you for your appointments.

## 2022-08-29 ENCOUNTER — Emergency Department (HOSPITAL_COMMUNITY): Payer: Medicare HMO

## 2022-08-29 ENCOUNTER — Other Ambulatory Visit: Payer: Self-pay

## 2022-08-29 ENCOUNTER — Observation Stay (HOSPITAL_COMMUNITY)
Admission: EM | Admit: 2022-08-29 | Discharge: 2022-08-30 | Disposition: A | Discharge status: Home / Self Care | Payer: Medicare HMO | Attending: Internal Medicine | Admitting: Internal Medicine

## 2022-08-29 ENCOUNTER — Encounter (HOSPITAL_COMMUNITY): Payer: Self-pay | Admitting: Emergency Medicine

## 2022-08-29 DIAGNOSIS — N1831 Chronic kidney disease, stage 3a: Secondary | ICD-10-CM | POA: Diagnosis not present

## 2022-08-29 DIAGNOSIS — E876 Hypokalemia: Secondary | ICD-10-CM | POA: Diagnosis not present

## 2022-08-29 DIAGNOSIS — Z9049 Acquired absence of other specified parts of digestive tract: Secondary | ICD-10-CM | POA: Insufficient documentation

## 2022-08-29 DIAGNOSIS — Z7901 Long term (current) use of anticoagulants: Secondary | ICD-10-CM | POA: Insufficient documentation

## 2022-08-29 DIAGNOSIS — I480 Paroxysmal atrial fibrillation: Secondary | ICD-10-CM | POA: Insufficient documentation

## 2022-08-29 DIAGNOSIS — R519 Headache, unspecified: Secondary | ICD-10-CM | POA: Diagnosis not present

## 2022-08-29 DIAGNOSIS — Z79899 Other long term (current) drug therapy: Secondary | ICD-10-CM | POA: Insufficient documentation

## 2022-08-29 DIAGNOSIS — D500 Iron deficiency anemia secondary to blood loss (chronic): Secondary | ICD-10-CM | POA: Diagnosis not present

## 2022-08-29 DIAGNOSIS — C189 Malignant neoplasm of colon, unspecified: Secondary | ICD-10-CM | POA: Diagnosis present

## 2022-08-29 DIAGNOSIS — E782 Mixed hyperlipidemia: Secondary | ICD-10-CM

## 2022-08-29 DIAGNOSIS — I4891 Unspecified atrial fibrillation: Secondary | ICD-10-CM | POA: Diagnosis present

## 2022-08-29 DIAGNOSIS — I10 Essential (primary) hypertension: Secondary | ICD-10-CM | POA: Diagnosis not present

## 2022-08-29 DIAGNOSIS — I160 Hypertensive urgency: Secondary | ICD-10-CM | POA: Diagnosis not present

## 2022-08-29 DIAGNOSIS — I129 Hypertensive chronic kidney disease with stage 1 through stage 4 chronic kidney disease, or unspecified chronic kidney disease: Secondary | ICD-10-CM | POA: Diagnosis present

## 2022-08-29 LAB — BASIC METABOLIC PANEL
Anion gap: 9 (ref 5–15)
BUN: 18 mg/dL (ref 8–23)
CO2: 29 mmol/L (ref 22–32)
Calcium: 9.8 mg/dL (ref 8.9–10.3)
Chloride: 99 mmol/L (ref 98–111)
Creatinine, Ser: 1.11 mg/dL — ABNORMAL HIGH (ref 0.44–1.00)
GFR, Estimated: 52 mL/min — ABNORMAL LOW (ref >60)
Glucose, Bld: 116 mg/dL — ABNORMAL HIGH (ref 70–99)
Potassium: 3.1 mmol/L — ABNORMAL LOW (ref 3.5–5.1)
Sodium: 137 mmol/L (ref 135–145)

## 2022-08-29 LAB — CBC
HCT: 40.3 % (ref 36.0–46.0)
Hemoglobin: 13.6 g/dL (ref 12.0–15.0)
MCH: 31.4 pg (ref 26.0–34.0)
MCHC: 33.7 g/dL (ref 30.0–36.0)
MCV: 93.1 fL (ref 80.0–100.0)
Platelets: 159 10*3/uL (ref 150–400)
RBC: 4.33 MIL/uL (ref 3.87–5.11)
RDW: 13.4 % (ref 11.5–15.5)
WBC: 6.8 10*3/uL (ref 4.0–10.5)
nRBC: 0 % (ref 0.0–0.2)

## 2022-08-29 MED ORDER — LABETALOL HCL 5 MG/ML IV SOLN
40.0000 mg | INTRAVENOUS | Status: DC | PRN
  Administered 2022-08-29: 40 mg via INTRAVENOUS
  Filled 2022-08-29: qty 8

## 2022-08-29 MED ORDER — LABETALOL HCL 5 MG/ML IV SOLN: 80.0000 mg | INTRAVENOUS | Status: DC | PRN

## 2022-08-29 MED ORDER — LABETALOL HCL 5 MG/ML IV SOLN
20.0000 mg | Freq: Once | INTRAVENOUS | Status: AC
  Administered 2022-08-29: 20 mg via INTRAVENOUS
  Filled 2022-08-29: qty 4

## 2022-08-29 NOTE — ED Triage Notes (Signed)
Pt with c/o HTN. Friend called MD @ [place] who stated that they needed to bring pt to be evaluated immediately. BP at home was 195/95 and 195/89. Pt with c/o "slight headache and slight blurred vision". Has taken her BP meds today.

## 2022-08-29 NOTE — ED Notes (Signed)
Pt in bed, pt offers no complaints, pt states that she is only concerned because her blood pressure is elevated.

## 2022-08-30 ENCOUNTER — Encounter: Payer: Self-pay | Admitting: *Deleted

## 2022-08-30 DIAGNOSIS — E876 Hypokalemia: Secondary | ICD-10-CM

## 2022-08-30 DIAGNOSIS — I160 Hypertensive urgency: Secondary | ICD-10-CM | POA: Diagnosis not present

## 2022-08-30 DIAGNOSIS — E782 Mixed hyperlipidemia: Secondary | ICD-10-CM

## 2022-08-30 DIAGNOSIS — D500 Iron deficiency anemia secondary to blood loss (chronic): Secondary | ICD-10-CM

## 2022-08-30 DIAGNOSIS — I10 Essential (primary) hypertension: Secondary | ICD-10-CM

## 2022-08-30 DIAGNOSIS — I480 Paroxysmal atrial fibrillation: Secondary | ICD-10-CM

## 2022-08-30 DIAGNOSIS — C189 Malignant neoplasm of colon, unspecified: Secondary | ICD-10-CM | POA: Diagnosis not present

## 2022-08-30 LAB — COMPREHENSIVE METABOLIC PANEL
ALT: 26 U/L (ref 0–44)
AST: 31 U/L (ref 15–41)
Albumin: 4.2 g/dL (ref 3.5–5.0)
Alkaline Phosphatase: 82 U/L (ref 38–126)
Anion gap: 11 (ref 5–15)
BUN: 18 mg/dL (ref 8–23)
CO2: 29 mmol/L (ref 22–32)
Calcium: 9.8 mg/dL (ref 8.9–10.3)
Chloride: 98 mmol/L (ref 98–111)
Creatinine, Ser: 1.08 mg/dL — ABNORMAL HIGH (ref 0.44–1.00)
GFR, Estimated: 54 mL/min — ABNORMAL LOW (ref >60)
Glucose, Bld: 166 mg/dL — ABNORMAL HIGH (ref 70–99)
Potassium: 3.3 mmol/L — ABNORMAL LOW (ref 3.5–5.1)
Sodium: 138 mmol/L (ref 135–145)
Total Bilirubin: 1 mg/dL (ref 0.3–1.2)
Total Protein: 8.1 g/dL (ref 6.5–8.1)

## 2022-08-30 LAB — CBC
HCT: 42.8 % (ref 36.0–46.0)
Hemoglobin: 14.4 g/dL (ref 12.0–15.0)
MCH: 31.4 pg (ref 26.0–34.0)
MCHC: 33.6 g/dL (ref 30.0–36.0)
MCV: 93.4 fL (ref 80.0–100.0)
Platelets: 171 10*3/uL (ref 150–400)
RBC: 4.58 MIL/uL (ref 3.87–5.11)
RDW: 13.6 % (ref 11.5–15.5)
WBC: 6.4 10*3/uL (ref 4.0–10.5)
nRBC: 0 % (ref 0.0–0.2)

## 2022-08-30 LAB — MAGNESIUM: Magnesium: 1.9 mg/dL (ref 1.7–2.4)

## 2022-08-30 LAB — PHOSPHORUS: Phosphorus: 3.4 mg/dL (ref 2.5–4.6)

## 2022-08-30 MED ORDER — NICARDIPINE HCL IN NACL 20-0.86 MG/200ML-% IV SOLN
3.0000 mg/h | INTRAVENOUS | Status: DC
  Administered 2022-08-30: 5 mg/h via INTRAVENOUS
  Filled 2022-08-30: qty 200

## 2022-08-30 MED ORDER — HYDROCHLOROTHIAZIDE 25 MG PO TABS
25.0000 mg | ORAL_TABLET | Freq: Every day | ORAL | Status: DC
  Administered 2022-08-30: 25 mg via ORAL
  Filled 2022-08-30: qty 1

## 2022-08-30 MED ORDER — AMLODIPINE BESYLATE 5 MG PO TABS
10.0000 mg | ORAL_TABLET | Freq: Every day | ORAL | Status: DC
  Administered 2022-08-30: 10 mg via ORAL
  Filled 2022-08-30: qty 2

## 2022-08-30 MED ORDER — HYDRALAZINE HCL 25 MG PO TABS
25.0000 mg | ORAL_TABLET | Freq: Two times a day (BID) | ORAL | Status: DC
  Administered 2022-08-30: 25 mg via ORAL
  Filled 2022-08-30: qty 1

## 2022-08-30 MED ORDER — FERROUS SULFATE 325 (65 FE) MG PO TABS
325.0000 mg | ORAL_TABLET | Freq: Every day | ORAL | Status: DC
  Administered 2022-08-30: 325 mg via ORAL
  Filled 2022-08-30: qty 1

## 2022-08-30 MED ORDER — HYDRALAZINE HCL 25 MG PO TABS: 25.0000 mg | ORAL_TABLET | Freq: Two times a day (BID) | ORAL | 1 refills | Status: DC

## 2022-08-30 MED ORDER — POTASSIUM CHLORIDE CRYS ER 20 MEQ PO TBCR
20.0000 meq | EXTENDED_RELEASE_TABLET | Freq: Once | ORAL | Status: AC
  Administered 2022-08-30: 20 meq via ORAL
  Filled 2022-08-30: qty 1

## 2022-08-30 MED ORDER — SPIRONOLACTONE 25 MG PO TABS: 25.0000 mg | ORAL_TABLET | Freq: Every day | ORAL | 1 refills | Status: DC

## 2022-08-30 MED ORDER — APIXABAN 5 MG PO TABS
5.0000 mg | ORAL_TABLET | Freq: Two times a day (BID) | ORAL | Status: DC
  Administered 2022-08-30: 5 mg via ORAL
  Filled 2022-08-30: qty 1

## 2022-08-30 MED ORDER — SPIRONOLACTONE 25 MG PO TABS
25.0000 mg | ORAL_TABLET | Freq: Every day | ORAL | Status: DC
  Administered 2022-08-30: 25 mg via ORAL
  Filled 2022-08-30: qty 1

## 2022-08-30 MED ORDER — ACETAMINOPHEN 325 MG PO TABS: 650.0000 mg | ORAL_TABLET | Freq: Four times a day (QID) | ORAL | Status: DC | PRN

## 2022-08-30 MED ORDER — POTASSIUM CHLORIDE CRYS ER 20 MEQ PO TBCR
40.0000 meq | EXTENDED_RELEASE_TABLET | Freq: Once | ORAL | Status: AC
  Administered 2022-08-30: 40 meq via ORAL
  Filled 2022-08-30: qty 2

## 2022-08-30 MED ORDER — ATORVASTATIN CALCIUM 10 MG PO TABS
20.0000 mg | ORAL_TABLET | Freq: Every day | ORAL | Status: DC
  Administered 2022-08-30: 20 mg via ORAL
  Filled 2022-08-30: qty 2

## 2022-08-30 MED ORDER — ACETAMINOPHEN 650 MG RE SUPP: 650.0000 mg | Freq: Four times a day (QID) | RECTAL | Status: DC | PRN

## 2022-08-30 MED ORDER — LOSARTAN POTASSIUM 50 MG PO TABS
100.0000 mg | ORAL_TABLET | Freq: Every day | ORAL | Status: DC
  Administered 2022-08-30: 100 mg via ORAL
  Filled 2022-08-30: qty 4

## 2022-08-30 NOTE — Progress Notes (Signed)
  Transition of Care [facility]) Screening Note   Patient Details  Name: Gabrielle [name] Date of Birth: July 29, 1946   Transition of Care [facility]) CM/SW Contact:    Iona Beard, [place] Phone Number: 08/30/2022, 11:14 AM    Transition of Care Department [facility]) has reviewed patient and no TOC needs have been identified at this time. We will continue to monitor patient advancement through interdisciplinary progression rounds. If new patient transition needs arise, please place a TOC consult.

## 2022-08-30 NOTE — Hospital Course (Signed)
76 year old female with a history of hypertension, hyperlipidemia, iron deficiency, adenocarcinoma of the colon, paroxysmal atrial fibrillation, CKD stage III presenting with elevated blood pressure.  The patient had been in her usual state of health when she noted that on a random blood pressure check it was 195/67 on the morning of 08/29/2022.  She rechecked it again in the late afternoon around 4 PM, and it was 195/98.  She called her PCP who told her to go to emergency department for further further evaluation and treatment. The patient herself states that she has had some intermittent frontal headaches the past few days.  She has chronic blurry vision which has not changed.  She denied any focal extremity weakness.  She denies any chest discomfort, shortness of breath, dizziness, syncope.  She has not had any difficulty with exercise endurance.  She endorses compliance with her medications.  Review the medical record shows that the patient has had numerous elevated blood pressures during her previous outpatient appointments dating back to March 2013.  Most recently, the patient has had blood pressures up to 190/110 during her oncology appointment on 08/03/2022.  Once again, during her Keytruda infusion on 08/24/2022, she had was noted to have a blood pressure again of 188/90.  These blood pressures were obtained even prior to the infusions being initiated. In the ED, the patient was afebrile and hemodynamically stable.  Initial blood pressure was 222/95.  Oxygen saturation was 96% room air.  WBC 6.8, hemoglobin 13.6, platelets 1 59,000.  Sodium 137, potassium 3.1, bicarbonate 29, serum creatinine 1.11.  The patient was started on nicardipine drip.  CT of the brain was negative.

## 2022-08-30 NOTE — Progress Notes (Signed)
Pt being discharged, symptoms resolved, BP 160/77. Pt verbalizes understanding of all discharge instructions. Waiting on daughter to pick her up.

## 2022-08-30 NOTE — ED Provider Notes (Signed)
  Provider Note MRN:  [id number]  Arrival date & time: 08/30/22    ED Course and Medical Decision Making  Assumed care from Dr. Kathrynn Humble at shift change.  Hypertension with headache and blurred vision plan for admission, receiving IV labetalol  Procedures  Final Clinical Impressions(s) / ED Diagnoses  No diagnosis found.  ED Discharge Orders     None       Discharge Instructions   None     Barth Kirks. [place] Small, [place] [email or web address]    Maudie Flakes, MD 08/30/22 [phone]

## 2022-08-30 NOTE — Discharge Summary (Signed)
Physician Discharge Summary   Patient: Gabrielle [name] MRN: [id number] DOB: 1946/01/27  Admit date:     08/29/2022  Discharge date: 08/30/22  Discharge Physician: Shanon Brow Carthel Castille   PCP: Janora Norlander, DO   Recommendations at discharge:   Please follow up with primary care provider within 1-2 weeks  Please repeat BMP and CBC in one week    Hospital Course: 76 year old female with a history of hypertension, hyperlipidemia, iron deficiency, adenocarcinoma of the colon, paroxysmal atrial fibrillation, CKD stage III presenting with elevated blood pressure.  The patient had been in her usual state of health when she noted that on a random blood pressure check it was 195/67 on the morning of 08/29/2022.  She rechecked it again in the late afternoon around 4 PM, and it was 195/98.  She called her PCP who told her to go to emergency department for further further evaluation and treatment. The patient herself states that she has had some intermittent frontal headaches the past few days.  She has chronic blurry vision which has not changed.  She denied any focal extremity weakness.  She denies any chest discomfort, shortness of breath, dizziness, syncope.  She has not had any difficulty with exercise endurance.  She endorses compliance with her medications.  Review the medical record shows that the patient has had numerous elevated blood pressures during her previous outpatient appointments dating back to March 2013.  Most recently, the patient has had blood pressures up to 190/110 during her oncology appointment on 08/03/2022.  Once again, during her Keytruda infusion on 08/24/2022, she had was noted to have a blood pressure again of 188/90.  These blood pressures were obtained even prior to the infusions being initiated. In the ED, the patient was afebrile and hemodynamically stable.  Initial blood pressure was 222/95.  Oxygen saturation was 96% room air.  WBC 6.8, hemoglobin 13.6, platelets 1 59,000.   Sodium 137, potassium 3.1, bicarbonate 29, serum creatinine 1.11.  The patient was started on nicardipine drip.  CT of the brain was negative.  Assessment and Plan: Hypertensive urgency -As noted above, the patient has had numerous elevated blood pressures during her numerous previous outpatient appointments and has been given as needed clonidine intermittently during her oncology appointments -initially on nicardipine drip in ED>>weaned off -Continue home dose of amlodipine and Hyzaar -Check renal and aldosterone ratio -08/03/2022 TSH 2.373 -Add spironolactone -Add hydralazine 25 mg twice daily -Patient is borderline bradycardic limiting initiation of beta-blockade -Follow-up with PCP for continued titration -Discussed keeping blood pressure log at home with the patient  Adenocarcinoma of the right colon-stage IV -Currently receiving Keytruda -Follow-up with Dr. Delton Coombes  Paroxysmal atrial fibrillation -Continue apixaban -Rate controlled  Hyperlipidemia -Continue statin  Hypokalemia -Repleted  Iron deficiency -Continue iron supplementation  CKD stage IIIa -Baseline creatinine 1.0-1.2     Consultants: none Procedures performed: none  Disposition: Home Diet recommendation:  Cardiac diet DISCHARGE MEDICATION: Allergies as of 08/30/2022       Reactions   Lisinopril Cough        Medication List     STOP taking these medications    lidocaine-prilocaine cream Commonly known as: EMLA       TAKE these medications    alendronate 70 MG tablet Commonly known as: FOSAMAX TAKE ONE TABLET EVERY 7 DAYS ON AN EMPTY STOMACH WITH A FULL GLASS OF WATER   amLODipine 10 MG tablet Commonly known as: NORVASC Take 1 tablet (10 mg total) by mouth daily. For blood  pressure   atorvastatin 20 MG tablet Commonly known as: LIPITOR Take 1 tablet (20 mg total) by mouth daily. For cholesterol   cholecalciferol 25 MCG (1000 UNIT) tablet Commonly known as: VITAMIN D3 Take  1,000 Units by mouth daily.   CRANBERRY FRUIT PO Take 1 tablet by mouth daily.   diclofenac Sodium 1 % Gel Commonly known as: VOLTAREN APPLY 4 GRAMS TO AFFECTED AREA(S) 4 TIMES A DAY   dorzolamide-timolol 22.3-6.8 MG/ML ophthalmic solution Commonly known as: COSOPT INSTILL ONE DROP IN EACH EYE TWICE DAILY   Eliquis 5 MG Tabs tablet Generic drug: apixaban TAKE ONE TABLET TWICE DAILY   FeroSul 325 (65 FE) MG tablet Generic drug: ferrous sulfate TAKE  (1)  TABLET TWICE A DAY WITH MEALS (BREAKFAST AND SUPPER)   Garlic 073 MG Tabs Take by mouth.   hydrALAZINE 25 MG tablet Commonly known as: APRESOLINE Take 1 tablet (25 mg total) by mouth 2 (two) times daily.   losartan-hydrochlorothiazide 100-25 MG tablet Commonly known as: HYZAAR TAKE 1 TABLET DAILY   potassium chloride SA 20 MEQ tablet Commonly known as: KLOR-CON M Take 1 tablet (20 mEq total) by mouth 3 (three) times daily.   spironolactone 25 MG tablet Commonly known as: ALDACTONE Take 1 tablet (25 mg total) by mouth daily. Start taking on: August 31, 2022        Discharge Exam: Danley Danker Weights   08/29/22 1905 08/29/22 1907  Weight: 78.5 kg 77.1 kg   HEENT:  [place]/AT, No thrush, no icterus CV:  RRR, no rub, no S3, no S4 Lung:  CTA, no wheeze, no rhonchi Abd:  soft/+BS, NT Ext:  No edema, no lymphangitis, no synovitis, no rash   Condition at discharge: stable  The results of significant diagnostics from this hospitalization (including imaging, microbiology, ancillary and laboratory) are listed below for reference.   Imaging Studies: CT Head Wo Contrast  Result Date: 08/29/2022 CLINICAL DATA:  Headache, new or worsening (Age >= 50y) hypertension, headache, blurred vision EXAM: CT HEAD WITHOUT CONTRAST TECHNIQUE: Contiguous axial images were obtained from the base of the skull through the vertex without intravenous contrast. RADIATION DOSE REDUCTION: This exam was performed according to the departmental  dose-optimization program which includes automated exposure control, adjustment of the mA and/or kV according to patient size and/or use of iterative reconstruction technique. COMPARISON:  None Available. FINDINGS: Brain: No acute intracranial abnormality. Specifically, no hemorrhage, hydrocephalus, mass lesion, acute infarction, or significant intracranial injury. Vascular: No hyperdense vessel or unexpected calcification. Skull: No acute calvarial abnormality. Sinuses/Orbits: No acute findings Other: None IMPRESSION: No acute intracranial abnormality. Electronically Signed   By: Rolm Baptise M.D.   On: 08/29/2022 23:22   Intravitreal Injection, Pharmacologic Agent - OS - Left Eye  Result Date: 08/05/2022 Time Out 08/05/2022. 9:36 AM. Confirmed correct patient, procedure, site, and patient consented. Anesthesia Topical anesthesia was used. Anesthetic medications included Lidocaine 2%, Proparacaine 0.5%. Procedure Preparation included 5% betadine to ocular surface, eyelid speculum. A (32g) needle was used. Injection: 2 mg aflibercept 2 MG/0.05ML   Route: Intravitreal, Site: Left Eye   NDC: [id number], Lot: [id number], Expiration date: 10/05/2023, Waste: 0 mL Post-op Post injection exam found visual acuity of at least counting fingers. The patient tolerated the procedure well. There were no complications. The patient received written and verbal post procedure care education. Post injection medications were not given.   OCT, Retina - OU - Both Eyes  Result Date: 08/05/2022 Right Eye Quality was good. Central Foveal Thickness: 553. Progression  has been stable. Findings include no SRF, abnormal foveal contour, retinal drusen , outer retinal tubulation, subretinal hyper-reflective material, disciform scar, epiretinal membrane, intraretinal fluid, pigment epithelial detachment, outer retinal atrophy (Stable sub-retinal scar, mild persistent cystic changes overlying). Left Eye Quality was good. Central Foveal  Thickness: 329. Progression has been stable. Findings include no SRF, abnormal foveal contour, retinal drusen , subretinal hyper-reflective material, intraretinal fluid, pigment epithelial detachment, outer retinal atrophy (Persistent cystic changes overlying PED/SRHM -- slightly improved). Notes *Images captured and stored on drive Diagnosis / Impression: Exudative ARMD OU OD: Stable sub-retinal scar, mild persistent cystic changes overlying OS: Persistent cystic changes overlying PED/SRHM -- slightly improved Clinical management: See below Abbreviations: NFP - Normal foveal profile. CME - cystoid macular edema. PED - pigment epithelial detachment. IRF - intraretinal fluid. SRF - subretinal fluid. EZ - ellipsoid zone. ERM - epiretinal membrane. ORA - outer retinal atrophy. ORT - outer retinal tubulation. SRHM - subretinal hyper-reflective material    Microbiology: Results for orders placed or performed in visit on 12/07/21  Novel Coronavirus, NAA ([facility])     Status: Abnormal   Collection Time: 12/07/21 11:12 AM   Specimen: Nasopharyngeal(NP) swabs in vial transport medium  Result Value Ref Range Status   SARS-CoV-2, NAA Detected (A) Not Detected Final    Comment: Patients who have a positive COVID-19 test result may now have treatment options. Treatment options are available for patients with mild to moderate symptoms and for hospitalized patients. Visit our website at [email or web address] for resources and information. This nucleic acid amplification test was developed and its performance characteristics determined by [employer]. Nucleic acid amplification tests include RT-PCR and TMA. This test has not been FDA cleared or approved. This test has been authorized by FDA under an Emergency Use Authorization (EUA). This test is only authorized for the duration of time the declaration that circumstances exist justifying the authorization of the emergency use of in  vitro diagnostic tests for detection of SARS-CoV-2 virus and/or diagnosis of COVID-19 infection under section 564(b)(1) of the Act, 21 U.S.C. 258NID-7(O) (1), unless the authorization is terminated or revoked sooner. When diagnostic testing is negativ e, the possibility of a false negative result should be considered in the context of a patient's recent exposures and the presence of clinical signs and symptoms consistent with COVID-19. An individual without symptoms of COVID-19 and who is not shedding SARS-CoV-2 virus would expect to have a negative (not detected) result in this assay.   SARS-COV-2, NAA 2 DAY Bettyjean Stefanski     Status: None   Collection Time: 12/07/21 11:12 AM  Result Value Ref Range Status   SARS-CoV-2, NAA 2 DAY Gaylan Fauver Performed  Final    Labs: CBC: Recent Labs  Lab 08/24/22 1139 08/29/22 2025 08/30/22 0914  WBC 6.4 6.8 6.4  NEUTROABS 4.1  --   --   HGB 13.1 13.6 14.4  HCT 39.0 40.3 42.8  MCV 93.1 93.1 93.4  PLT 142* 159 242   Basic Metabolic Panel: Recent Labs  Lab 08/24/22 1139 08/29/22 2025 08/30/22 0914  NA 136 137 138  K 3.3* 3.1* 3.3*  CL 101 99 98  CO2 '29 29 29  '$ GLUCOSE 116* 116* 166*  BUN '18 18 18  '$ CREATININE 1.09* 1.11* 1.08*  CALCIUM 9.5 9.8 9.8  MG 2.0  --  1.9  PHOS  --   --  3.4   Liver Function Tests: Recent Labs  Lab 08/24/22 1139 08/30/22 0914  AST 33 31  ALT 33 26  ALKPHOS 77 82  BILITOT 0.9 1.0  PROT 7.5 8.1  ALBUMIN 4.0 4.2   CBG: No results for input(s): "GLUCAP" in the last 168 hours.  Discharge time spent: greater than 30 minutes.  Signed: Orson Eva, MD [facility] 08/30/2022

## 2022-08-30 NOTE — ED Provider Notes (Signed)
[facility] EMERGENCY DEPARTMENT Provider Note   CSN: [id number] Arrival date & time: 08/29/22  1815     History  Chief Complaint  Patient presents with   Hypertension    Gabrielle [name] is a 76 y.o. female.  HPI    76 year old female comes in with chief complaint of elevated blood pressure.  Patient has history of hypertension for which she is on amlodipine and Hyzaar. Patient has history of paroxysmal A-fib, colon cancer.  While at her oncology visit, her BP was noted to be elevated last week.  She was advised to check her BP frequently.  She got hold of a blood pressure machine today and checked her pressures, they were elevated over 200 consistently and she was advised to come to the ER.  Patient is also complaining of generalized headache and blurry vision.  She has baseline eye issues/cataract issues -but her blurry vision was worse.  Blurry vision has been intermittent.  No vision loss.  She has been taking her medications as prescribed.  No new changes.   Home Medications Prior to Admission medications   Medication Sig Start Date End Date Taking? Authorizing Provider  alendronate (FOSAMAX) 70 MG tablet TAKE ONE TABLET EVERY 7 DAYS ON AN EMPTY STOMACH WITH A FULL GLASS OF WATER 07/04/22  Yes Gottschalk, Ashly M, DO  amLODipine (NORVASC) 10 MG tablet Take 1 tablet (10 mg total) by mouth daily. For blood pressure 06/03/22  Yes Gottschalk, Ashly M, DO  atorvastatin (LIPITOR) 20 MG tablet Take 1 tablet (20 mg total) by mouth daily. For cholesterol 06/03/22  Yes Gottschalk, Ashly M, DO  cholecalciferol (VITAMIN D3) 25 MCG (1000 UT) tablet Take 1,000 Units by mouth daily.   Yes [provider]  CRANBERRY FRUIT PO Take 1 tablet by mouth daily.   Yes [provider]  diclofenac Sodium (VOLTAREN) 1 % GEL APPLY 4 GRAMS TO AFFECTED AREA(S) 4 TIMES A DAY 04/12/21  Yes Gottschalk, Ashly M, DO  ELIQUIS 5 MG TABS tablet TAKE ONE TABLET TWICE DAILY 04/21/22  Yes Derek Jack, MD  FEROSUL 325 (65 Fe) MG tablet TAKE  (1)  TABLET TWICE A DAY WITH MEALS (BREAKFAST AND SUPPER) 04/12/21  Yes Ronnie Doss M, DO  losartan-hydrochlorothiazide (HYZAAR) 100-25 MG tablet TAKE 1 TABLET DAILY 04/13/22  Yes Gottschalk, Ashly M, DO  potassium chloride SA (KLOR-CON M) 20 MEQ tablet Take 1 tablet (20 mEq total) by mouth 3 (three) times daily. 05/04/22  Yes Derek Jack, MD  dorzolamide-timolol (COSOPT) 22.3-6.8 MG/ML ophthalmic solution INSTILL ONE DROP IN [facility] EYE TWICE DAILY Patient not taking: Reported on 08/30/2022 05/16/22   Bernarda Caffey, MD  hydrALAZINE (APRESOLINE) 25 MG tablet Take 1 tablet (25 mg total) by mouth 2 (two) times daily. 08/30/22   Orson Eva, MD  spironolactone (ALDACTONE) 25 MG tablet Take 1 tablet (25 mg total) by mouth daily. 08/31/22   Orson Eva, MD      Allergies    Lisinopril    Review of Systems   Review of Systems  All other systems reviewed and are negative.   Physical Exam Updated Vital Signs BP (!) 160/77 (BP Location: Right Arm)   Pulse 72   Temp (!) 97.5 F (36.4 C) (Oral)   Resp 18   Ht '5\' 2"'$  (1.575 m)   Wt 77.1 kg   SpO2 97%   BMI 31.09 kg/m  Physical Exam Vitals and nursing note reviewed.  Constitutional:      Appearance: She is well-developed.  HENT:     Head: Atraumatic.  Eyes:     Extraocular Movements: Extraocular movements intact.     Pupils: Pupils are equal, round, and reactive to light.     Comments: Peripheral visual field is intact, finger counting is normal at the bedside  Cardiovascular:     Rate and Rhythm: Normal rate.  Pulmonary:     Effort: Pulmonary effort is normal.  Musculoskeletal:     Cervical back: Normal range of motion and neck supple.  Skin:    General: Skin is warm and dry.  Neurological:     Mental Status: She is alert and oriented to person, place, and time.     Cranial Nerves: No cranial nerve deficit.     Sensory: No sensory deficit.     Motor: No weakness.     ED  Results / Procedures / Treatments   Labs (all labs ordered are listed, but only abnormal results are displayed) Labs Reviewed  BASIC METABOLIC PANEL - Abnormal; Notable for the following components:      Result Value   Potassium 3.1 (*)    Glucose, Bld 116 (*)    Creatinine, Ser 1.11 (*)    GFR, Estimated 52 (*)    All other components within normal limits  COMPREHENSIVE METABOLIC PANEL - Abnormal; Notable for the following components:   Potassium 3.3 (*)    Glucose, Bld 166 (*)    Creatinine, Ser 1.08 (*)    GFR, Estimated 54 (*)    All other components within normal limits  CBC  CBC  MAGNESIUM  PHOSPHORUS  ALDOSTERONE + RENIN ACTIVITY W/ RATIO    EKG EKG Interpretation  Date/Time:  Monday August 29 2022 21:31:44 EDT Ventricular Rate:  71 PR Interval:  155 QRS Duration: 93 QT Interval:  367 QTC Calculation: 399 R Axis:   9 Text Interpretation: Sinus rhythm Atrial premature complex Probable LVH with secondary repol abnrm No acute changes No significant change since last tracing Confirmed by Varney Biles [phone]) on 08/29/2022 10:17:38 PM  Radiology CT Head Wo Contrast  Result Date: 08/29/2022 CLINICAL DATA:  Headache, new or worsening (Age >= 50y) hypertension, headache, blurred vision EXAM: CT HEAD WITHOUT CONTRAST TECHNIQUE: Contiguous axial images were obtained from the base of the skull through the vertex without intravenous contrast. RADIATION DOSE REDUCTION: This exam was performed according to the departmental dose-optimization program which includes automated exposure control, adjustment of the mA and/or kV according to patient size and/or use of iterative reconstruction technique. COMPARISON:  None Available. FINDINGS: Brain: No acute intracranial abnormality. Specifically, no hemorrhage, hydrocephalus, mass lesion, acute infarction, or significant intracranial injury. Vascular: No hyperdense vessel or unexpected calcification. Skull: No acute calvarial  abnormality. Sinuses/Orbits: No acute findings Other: None IMPRESSION: No acute intracranial abnormality. Electronically Signed   By: Rolm Baptise M.D.   On: 08/29/2022 23:22    Procedures .Critical Care  Performed by: Varney Biles, MD Authorized by: Varney Biles, MD   Critical care provider statement:    Critical care time (minutes):  36   Critical care was necessary to treat or prevent imminent or life-threatening deterioration of the following conditions:  Circulatory failure   Critical care was time spent personally by me on the following activities:  Development of treatment plan with patient or surrogate, discussions with consultants, evaluation of patient's response to treatment, examination of patient, ordering and review of laboratory studies, ordering and review of radiographic studies, ordering and performing treatments and interventions, pulse oximetry, re-evaluation of  patient's condition and review of old charts     Medications Ordered in ED Medications  labetalol (NORMODYNE) injection 20 mg (20 mg Intravenous Given 08/29/22 2300)    And  labetalol (NORMODYNE) injection 40 mg (40 mg Intravenous Given 08/29/22 2353)    And  labetalol (NORMODYNE) injection 80 mg (has no administration in time range)  acetaminophen (TYLENOL) tablet 650 mg (has no administration in time range)    Or  acetaminophen (TYLENOL) suppository 650 mg (has no administration in time range)  atorvastatin (LIPITOR) tablet 20 mg (20 mg Oral Given 08/30/22 1101)  apixaban (ELIQUIS) tablet 5 mg (5 mg Oral Given 08/30/22 1032)  ferrous sulfate tablet 325 mg (325 mg Oral Given 08/30/22 0756)  amLODipine (NORVASC) tablet 10 mg (10 mg Oral Given 08/30/22 0756)  losartan (COZAAR) tablet 100 mg (100 mg Oral Given 08/30/22 0757)  hydrochlorothiazide (HYDRODIURIL) tablet 25 mg (25 mg Oral Given 08/30/22 0757)  spironolactone (ALDACTONE) tablet 25 mg (25 mg Oral Given 08/30/22 0756)  hydrALAZINE (APRESOLINE) tablet 25  mg (25 mg Oral Given 08/30/22 0756)  potassium chloride SA (KLOR-CON M) CR tablet 40 mEq (40 mEq Oral Given 08/30/22 0557)  potassium chloride SA (KLOR-CON M) CR tablet 20 mEq (20 mEq Oral Given 08/30/22 1101)    ED Course/ Medical Decision Making/ A&P                           Medical Decision Making Amount and/or Complexity of Data Reviewed Labs: ordered. Radiology: ordered.  Risk Prescription drug management. Decision regarding hospitalization.    This patient presents to the ED with chief complaint(s) of elevated blood pressure, headache, intermittent blurry vision with pertinent past medical history of hypertension for which patient is on 2 agents, active cancer on chemo.The complaint involves an extensive differential diagnosis and also carries with it a high risk of complications and morbidity.    The differential diagnosis includes : Hypertensive bleed, hypertensive emergency leading to vision change/headaches.  Also considered stroke, vitreous hemorrhage and retinal injury/displacement for binocular blurry vision, but all of those conditions are highly unlikely in this clinical setting.  The initial plan is to get CT scan of the brain and basic blood work.  EKG also ordered. Patient has no chest pain, troponins not indicated. Neuro exam is nonfocal, no MRI indicated if CT is negative.  For hypertension, goal MAP will be 100 in the ER.  We will start labetalol IV pushes. Once closer to the goal, will start patient on Cleviprex drip.  Additional history obtained: Additional history obtained from  patient's friend, neighbor Records reviewed Primary Care Documents, including patient's BP medications which include Hyzaar and amlodipine.  Independent labs interpretation:  The following labs were independently interpreted: CBC and BMP are normal  Independent visualization and interpretation of imaging: - I independently visualized the following imaging with scope of  interpretation limited to determining acute life threatening conditions related to emergency care: CT scan of the brain, which revealed no evidence of brain bleed.  Treatment and Reassessment: Pt received 2 rounds of labetalol - MAP down to 110. Case discussed with medicine -they recommend nicardipine drip, which will be initiated. - stable for admission.   Final Clinical Impression(s) / ED Diagnoses Final diagnoses:  Malignant hypertension    Rx / DC Orders ED Discharge Orders          Ordered    spironolactone (ALDACTONE) 25 MG tablet  Daily  08/30/22 1003    hydrALAZINE (APRESOLINE) 25 MG tablet  2 times daily        08/30/22 1003              Varney Biles, MD 08/30/22 1702

## 2022-08-30 NOTE — Progress Notes (Signed)
Oncology Discharge Planning Note  [facility] Address: [address], [place] [address] Hours of Operation:  8am - 5pm, Monday - Friday  Clinic Contact Information:  [phone]  Oncology Care Team: Medical Oncologist:  Derek Jack  Patient Details: Name:  Gabrielle [name], Gabrielle [name] MRN:   [id number] DOB:   08-09-1946 Reason for Current Admission: '@PPROB'$ @  Discharge Planning Narrative: Discharge follow-up appointments for oncology are current and available on the AVS and MyChart.   Upon discharge from the hospital, hematology/oncology's post discharge plan of care for the outpatient setting is: 09/14/22 as previously planned.    Outpatient Oncology Specific Care Only: Oncology appointment transportation needs addressed?:  not applicable Oncology medication management for symptom management addressed?:  not applicable Chemo Alert Card reviewed?:  not applicable Immunotherapy Alert Card reviewed?:  not applicable

## 2022-08-30 NOTE — H&P (Signed)
History and Physical    Patient: Gabrielle [name] YKD:[id number] DOB: 1946/10/23 DOA: 08/29/2022 DOS: the patient was seen and examined on 08/30/2022 PCP: Janora Norlander, DO  Patient coming from: Home  Chief Complaint:  Chief Complaint  Patient presents with   Hypertension   HPI: Gabrielle [name] is a 76 y.o. female with medical history significant of hypertension, hyperlipidemia, and colon cancer who presents to the emergency department accompanied by friend due to elevated blood pressure.  Patient had a follow-up with oncologist last week and during the visit, BP was noted to be elevated and she was instructed to monitor her blood pressure and on checking the blood pressure today, SBP was greater than 200 consistently, her physician was contacted and she was asked to go to the ER for further evaluation and management.  She complained of blurry vision and headache (of note, patient has a baseline vision problem/cataracts-but she experienced a worsening blurry vision which was intermittent with a loss of vision.  Patient states that she has been compliant with her medications.  ED Course:  In the emergency department, patient was intermittently bradycardic and BP was 171/86.  Work-up in the ED showed normal CBC and hypokalemia, BUNs/creatinine 18/1.11 (creatinine is within baseline range) CT head without contrast showed no acute intracranial abnormality Patient was initially treated with IV labetalol 20 mg x 1 without much improvement in BP, so patient was started on IV nicardipine drip.  Hospitalist was asked to admit patient for further evaluation and management.   Review of Systems: Review of systems as noted in the HPI. All other systems reviewed and are negative.   Past Medical History:  Diagnosis Date   Arthritis    Cancer [facility])    Cataract    OU   Colon cancer ([place])    Family history of pancreatic cancer 08/21/2020   Family history of uterine cancer 08/21/2020   H/O cesarean  section    Hx of tonsillectomy    Hyperlipidemia    Hypertension    Macular degeneration    Exu ARMD OU   Paroxysmal atrial fibrillation ([place]) 07/05/2020   Past Surgical History:  Procedure Laterality Date   BIOPSY  04/06/2020   Procedure: BIOPSY;  Surgeon: Rogene Houston, MD;  [facility];  Service: Endoscopy;;   CARPAL TUNNEL RELEASE Right    CATARACT EXTRACTION W/PHACO Left 10/11/2019   Procedure: CATARACT EXTRACTION PHACO AND INTRAOCULAR LENS PLACEMENT ([place]);  Surgeon: Baruch Goldmann, MD;  Location: [facility];  Service: Ophthalmology;  Laterality: Left;  CDE: 8.56   CATARACT EXTRACTION W/PHACO Right 10/25/2019   Procedure: CATARACT EXTRACTION PHACO AND INTRAOCULAR LENS PLACEMENT ([facility]);  Surgeon: Baruch Goldmann, MD;  Location: [facility];  Service: Ophthalmology;  Laterality: Right;  CDE: 5.67   CESAREAN SECTION     COLONOSCOPY N/A 04/06/2020   Procedure: COLONOSCOPY;  Surgeon: Rogene Houston, MD;  [facility];  Service: Endoscopy;  Laterality: N/A;   CYSTOSCOPY WITH BIOPSY N/A 04/08/2020   Procedure: CYSTOSCOPY WITH BIOPSY;  Surgeon: Cleon Gustin, MD;  Location: [facility];  Service: Urology;  Laterality: N/A;   ESOPHAGOGASTRODUODENOSCOPY N/A 04/05/2020   Procedure: ESOPHAGOGASTRODUODENOSCOPY (EGD);  Surgeon: Rogene Houston, MD;  [facility];  Service: Endoscopy;  Laterality: N/A;   PARTIAL COLECTOMY N/A 04/08/2020   Procedure: PARTIAL COLECTOMY;  Surgeon: Virl Cagey, MD;  Location: [facility];  Service: General;  Laterality: N/A;   PORTACATH PLACEMENT Left 05/18/2020   Procedure: INSERTION  PORT-A-CATH (ATTACHED CATHETER IN LEFT SUBCLAVIAN);  Surgeon: Virl Cagey, MD;  Location: [facility];  Service: General;  Laterality: Left;   TONSILLECTOMY      Social History:  reports that she has never smoked. She has never used smokeless tobacco. She reports that she does not drink alcohol and does not use drugs.   Allergies  Allergen Reactions    Lisinopril Cough    Family History  Problem Relation Age of Onset   Macular degeneration Mother    Stroke Mother    Hypertension Mother    Dementia Mother    Lung cancer Father        dx late 59s; smoking hx   Diabetes Sister    Hypertension Sister    Leukemia Paternal Uncle        d. 25s   Cancer Maternal Aunt        ovarian or endometrial dx 62s   Pancreatic cancer Paternal Uncle        d. late 11s     Prior to Admission medications   Medication Sig Start Date End Date Taking? Authorizing Provider  alendronate (FOSAMAX) 70 MG tablet TAKE ONE TABLET EVERY 7 DAYS ON AN EMPTY STOMACH WITH A FULL GLASS OF WATER 07/04/22   Ronnie Doss M, DO  amLODipine (NORVASC) 10 MG tablet Take 1 tablet (10 mg total) by mouth daily. For blood pressure 06/03/22   Gottschalk, Ashly M, DO  atorvastatin (LIPITOR) 20 MG tablet Take 1 tablet (20 mg total) by mouth daily. For cholesterol 06/03/22   Ronnie Doss M, DO  cholecalciferol (VITAMIN D3) 25 MCG (1000 UT) tablet Take 1,000 Units by mouth daily.    [provider]  CRANBERRY FRUIT PO Take 1 tablet by mouth daily.    [provider]  diclofenac Sodium (VOLTAREN) 1 % GEL APPLY 4 GRAMS TO AFFECTED AREA(S) 4 TIMES A DAY 04/12/21   Gottschalk, Ashly M, DO  dorzolamide-timolol (COSOPT) 22.3-6.8 MG/ML ophthalmic solution INSTILL ONE DROP IN [facility] EYE TWICE DAILY 05/16/22   Bernarda Caffey, MD  ELIQUIS 5 MG TABS tablet TAKE ONE TABLET TWICE DAILY 04/21/22   Derek Jack, MD  FEROSUL 325 (65 Fe) MG tablet TAKE  (1)  TABLET TWICE A DAY WITH MEALS (BREAKFAST AND SUPPER) 04/12/21   Ronnie Doss M, DO  Garlic 650 MG TABS Take by mouth.    [provider]  lidocaine-prilocaine (EMLA) cream Apply a small amount to port a cath site and cover with plastic wrap 1 hour prior to chemotherapy appointments Patient not taking: Reported on 08/03/2022 05/14/20   Derek Jack, MD  losartan-hydrochlorothiazide [facility]) 100-25 MG  tablet TAKE 1 TABLET DAILY 04/13/22   Ronnie Doss M, DO  potassium chloride SA (KLOR-CON M) 20 MEQ tablet Take 1 tablet (20 mEq total) by mouth 3 (three) times daily. 05/04/22   Derek Jack, MD  prochlorperazine (COMPAZINE) 10 MG tablet Take 1 tablet (10 mg total) by mouth every 6 (six) hours as needed for nausea or vomiting. Patient not taking: Reported on 08/03/2022 06/22/20   Derek Jack, MD    Physical Exam: BP (!) 177/65   Pulse (!) 58   Temp 98 F (36.7 C) (Oral)   Resp 14   Ht '5\' 2"'$  (1.575 m)   Wt 77.1 kg   SpO2 96%   BMI 31.09 kg/m   General: 76 y.o. year-old female well developed well nourished in no acute distress.  Alert and oriented x3. HEENT: NCAT, EOMI Neck: Supple,  trachea medial Cardiovascular: Regular rate and rhythm with no rubs or gallops.  No thyromegaly or JVD noted.  No lower extremity edema. 2/4 pulses in all 4 extremities. Respiratory: Clear to auscultation with no wheezes or rales. Good inspiratory effort. Abdomen: Soft, nontender nondistended with normal bowel sounds x4 quadrants. Muskuloskeletal: No cyanosis, clubbing or edema noted bilaterally Neuro: CN II-XII intact, strength 5/5 x 4, sensation, reflexes intact Skin: No ulcerative lesions noted or rashes Psychiatry: Judgement and insight appear normal. Mood is appropriate for condition and setting          Labs on Admission:  Basic Metabolic Panel: Recent Labs  Lab 08/24/22 1139 08/29/22 2025  NA 136 137  K 3.3* 3.1*  CL 101 99  CO2 29 29  GLUCOSE 116* 116*  BUN 18 18  CREATININE 1.09* 1.11*  CALCIUM 9.5 9.8  MG 2.0  --    Liver Function Tests: Recent Labs  Lab 08/24/22 1139  AST 33  ALT 33  ALKPHOS 77  BILITOT 0.9  PROT 7.5  ALBUMIN 4.0   No results for input(s): "LIPASE", "AMYLASE" in the last 168 hours. No results for input(s): "AMMONIA" in the last 168 hours. CBC: Recent Labs  Lab 08/24/22 1139 08/29/22 2025  WBC 6.4 6.8  NEUTROABS 4.1  --   HGB  13.1 13.6  HCT 39.0 40.3  MCV 93.1 93.1  PLT 142* 159   Cardiac Enzymes: No results for input(s): "CKTOTAL", "CKMB", "CKMBINDEX", "TROPONINI" in the last 168 hours.  BNP (last 3 results) No results for input(s): "BNP" in the last 8760 hours.  ProBNP (last 3 results) No results for input(s): "PROBNP" in the last 8760 hours.  CBG: No results for input(s): "GLUCAP" in the last 168 hours.  Radiological Exams on Admission: CT Head Wo Contrast  Result Date: 08/29/2022 CLINICAL DATA:  Headache, new or worsening (Age >= 50y) hypertension, headache, blurred vision EXAM: CT HEAD WITHOUT CONTRAST TECHNIQUE: Contiguous axial images were obtained from the base of the skull through the vertex without intravenous contrast. RADIATION DOSE REDUCTION: This exam was performed according to the departmental dose-optimization program which includes automated exposure control, adjustment of the mA and/or kV according to patient size and/or use of iterative reconstruction technique. COMPARISON:  None Available. FINDINGS: Brain: No acute intracranial abnormality. Specifically, no hemorrhage, hydrocephalus, mass lesion, acute infarction, or significant intracranial injury. Vascular: No hyperdense vessel or unexpected calcification. Skull: No acute calvarial abnormality. Sinuses/Orbits: No acute findings Other: None IMPRESSION: No acute intracranial abnormality. Electronically Signed   By: Rolm Baptise M.D.   On: 08/29/2022 23:22    EKG: I independently viewed the EKG done and my findings are as followed: Normal sinus rhythm at rate of 71 bpm with APCs  Assessment/Plan Present on Admission:  Hypertensive urgency  Essential hypertension  Hypokalemia  Malignant neoplasm of colon (HCC)  Iron deficiency anemia due to chronic blood loss  Atrial fibrillation (HCC)  Principal Problem:   Hypertensive urgency Active Problems:   Essential hypertension   Mixed hyperlipidemia   Malignant neoplasm of colon (HCC)    Hypokalemia   Iron deficiency anemia due to chronic blood loss   Atrial fibrillation ([place])  Hypertensive urgency-resolved Essential hypertension-uncontrolled Continue IV nicardipine drip and transition to oral meds in the morning  Hypokalemia K+ 3.1, this will be replenished  Mixed hyperlipidemia Continue Lipitor  Colon cancer Stable, patient will continue outpatient follow-up on discharge  Iron deficiency anemia Continue FeroSul  Paroxysmal atrial fibrillation EKG personally reviewed: Patient with normal  sinus rhythm at rate of 71 bpm with APCs Continue Eliquis Patient was not on any rate control medication Continue to monitor   DVT prophylaxis: Eliquis  Code Status: DNR  Consults: None  Family Communication: Friend at bedside (all questions answered to satisfaction)  Severity of Illness: The appropriate patient status for this patient is OBSERVATION. Observation status is judged to be reasonable and necessary in order to provide the required intensity of service to ensure the patient's safety. The patient's presenting symptoms, physical exam findings, and initial radiographic and laboratory data in the context of their medical condition is felt to place them at decreased risk for further clinical deterioration. Furthermore, it is anticipated that the patient will be medically stable for discharge from the hospital within 2 midnights of admission.   Author: Bernadette Hoit, DO 08/30/2022 5:32 AM  For on call review [email or web address].

## 2022-08-30 NOTE — Progress Notes (Addendum)
[facility] Note  09/09/2022    CHIEF COMPLAINT Patient presents for Retina Follow Up  HISTORY OF PRESENT ILLNESS: Gabrielle [name] is a 76 y.o. female who presents to the clinic today for:   HPI     Retina Follow Up   Patient presents with  Wet AMD.  In both eyes.  Severity is moderate.  Duration of 5 weeks.  Since onset it is stable.  I, the attending physician,  performed the HPI with the patient and updated documentation appropriately.        Comments   Pt here for 5 wk ret f/u exu ARMD OU. Pt states VA is the same.       Last edited by Bernarda Caffey, MD on 09/09/2022 12:31 PM.    Pt has been having problems with high blood pressure, she has been to the ED twice for it since she was here last  Referring physician: Janora Norlander, [place],  [place] [id number]  HISTORICAL INFORMATION:   Selected notes from the [place] Referred by Dr. Cristela Blue for concern of ARMD OU   CURRENT MEDICATIONS: Current Outpatient Medications (Ophthalmic Drugs)  Medication Sig   dorzolamide-timolol (COSOPT) 22.3-6.8 MG/ML ophthalmic solution INSTILL ONE DROP IN [facility] EYE TWICE DAILY (Patient not taking: Reported on 08/30/2022)   No current facility-administered medications for this visit. (Ophthalmic Drugs)   Current Outpatient Medications (Other)  Medication Sig   alendronate (FOSAMAX) 70 MG tablet TAKE ONE TABLET EVERY 7 DAYS ON AN EMPTY STOMACH WITH A FULL GLASS OF WATER   atorvastatin (LIPITOR) 20 MG tablet Take 1 tablet (20 mg total) by mouth daily. For cholesterol   cholecalciferol (VITAMIN D3) 25 MCG (1000 UT) tablet Take 1,000 Units by mouth daily.   CRANBERRY FRUIT PO Take 1 tablet by mouth daily.   diclofenac Sodium (VOLTAREN) 1 % GEL APPLY 4 GRAMS TO AFFECTED AREA(S) 4 TIMES A DAY   ELIQUIS 5 MG TABS tablet TAKE ONE TABLET TWICE DAILY   FEROSUL 325 (65 Fe) MG tablet TAKE  (1)  TABLET TWICE A DAY WITH MEALS (BREAKFAST AND  SUPPER)   hydrALAZINE (APRESOLINE) 25 MG tablet Take 1 tablet (25 mg total) by mouth 2 (two) times daily.   losartan-hydrochlorothiazide (HYZAAR) 100-25 MG tablet TAKE 1 TABLET DAILY   potassium chloride SA (KLOR-CON M) 20 MEQ tablet TAKE ONE TABLET THREE TIMES DAILY   spironolactone (ALDACTONE) 25 MG tablet Take 1 tablet (25 mg total) by mouth daily.   amLODipine (NORVASC) 10 MG tablet Take 1 tablet (10 mg total) by mouth daily. For blood pressure (Patient not taking: Reported on 09/06/2022)   No current facility-administered medications for this visit. (Other)   REVIEW OF SYSTEMS: ROS   Positive for: Gastrointestinal, Genitourinary, Musculoskeletal, Cardiovascular, Eyes Negative for: Constitutional, Neurological, Skin, HENT, Endocrine, Respiratory, Psychiatric, Allergic/Imm, Heme/Lymph Last edited by Kingsley Spittle, COT on 09/09/2022  8:16 AM.     ALLERGIES Allergies  Allergen Reactions   Lisinopril Cough   PAST MEDICAL HISTORY Past Medical History:  Diagnosis Date   Arthritis    Cancer [facility])    Cataract    OU   Colon cancer ([place])    Family history of pancreatic cancer 08/21/2020   Family history of uterine cancer 08/21/2020   H/O cesarean section    Hx of tonsillectomy    Hyperlipidemia    Hypertension    Macular degeneration    Exu ARMD  OU   Paroxysmal atrial fibrillation ([place]) 07/05/2020   Past Surgical History:  Procedure Laterality Date   BIOPSY  04/06/2020   Procedure: BIOPSY;  Surgeon: Rogene Houston, MD;  Location: [facility];  Service: Endoscopy;;   CARPAL TUNNEL RELEASE Right    CATARACT EXTRACTION W/PHACO Left 10/11/2019   Procedure: CATARACT EXTRACTION PHACO AND INTRAOCULAR LENS PLACEMENT ([place]);  Surgeon: Baruch Goldmann, MD;  Location: [facility] ORS;  Service: Ophthalmology;  Laterality: Left;  CDE: 8.56   CATARACT EXTRACTION W/PHACO Right 10/25/2019   Procedure: CATARACT EXTRACTION PHACO AND INTRAOCULAR LENS PLACEMENT (IOC);  Surgeon: Baruch Goldmann, MD;   Location: [facility] ORS;  Service: Ophthalmology;  Laterality: Right;  CDE: 5.67   CESAREAN SECTION     COLONOSCOPY N/A 04/06/2020   Procedure: COLONOSCOPY;  Surgeon: Rogene Houston, MD;  Location: [facility];  Service: Endoscopy;  Laterality: N/A;   CYSTOSCOPY WITH BIOPSY N/A 04/08/2020   Procedure: CYSTOSCOPY WITH BIOPSY;  Surgeon: Cleon Gustin, MD;  Location: [facility] ORS;  Service: Urology;  Laterality: N/A;   ESOPHAGOGASTRODUODENOSCOPY N/A 04/05/2020   Procedure: ESOPHAGOGASTRODUODENOSCOPY (EGD);  Surgeon: Rogene Houston, MD;  Location: [facility];  Service: Endoscopy;  Laterality: N/A;   PARTIAL COLECTOMY N/A 04/08/2020   Procedure: PARTIAL COLECTOMY;  Surgeon: Virl Cagey, MD;  Location: [facility] ORS;  Service: General;  Laterality: N/A;   PORTACATH PLACEMENT Left 05/18/2020   Procedure: INSERTION PORT-A-CATH (ATTACHED CATHETER IN LEFT SUBCLAVIAN);  Surgeon: Virl Cagey, MD;  Location: [facility] OR[facility];  Service: General;  Laterality: Left;   TONSILLECTOMY     FAMILY HISTORY Family History  Problem Relation Age of Onset   Macular degeneration Mother    Stroke Mother    Hypertension Mother    Dementia Mother    Lung cancer Father        dx late 8s; smoking hx   Diabetes Sister    Hypertension Sister    Leukemia Paternal Uncle        d. 28s   Cancer Maternal Aunt        ovarian or endometrial dx 39s   Pancreatic cancer Paternal Uncle        d. late 67s   SOCIAL HISTORY Social History   Tobacco Use   Smoking status: Never   Smokeless tobacco: Never  Vaping Use   Vaping Use: Never used  Substance Use Topics   Alcohol use: No   Drug use: No       OPHTHALMIC EXAM: Base Eye Exam     Visual Acuity (Snellen - Linear)       Right Left   Dist cc CF at 3' 20/70 -2   Dist ph cc NI NI    Correction: Glasses         Tonometry (Tonopen, 8:23 AM)       Right Left   Pressure 18 13         Pupils       Dark Light Shape React APD   Right 3 2 Round Brisk None    Left 3 2 Round Brisk None         Visual Fields (Counting fingers)       Left Right    Full Full         Extraocular Movement       Right Left    Full, Ortho Full, Ortho         Neuro/Psych     Oriented x3: Yes  Mood/Affect: Normal         Dilation     Both eyes: 1.0% Mydriacyl, 2.5% Phenylephrine @ 8:24 AM           Slit Lamp and Fundus Exam     Slit Lamp Exam       Right Left   Lids/Lashes Dermatochalasis - upper lid Dermatochalasis - upper lid   Conjunctiva/Sclera 1+ Injection Trace Injection   Cornea Arcus, 3+ fine Punctpate epithelial erosions Arcus, 3+ fine Punctate epithelial erosions, mild tear film debris   Anterior Chamber Deep and quiet Deep and quiet   Iris Round and well dilated Round and well dilated   Lens PCIOL in good position, 1+ Posterior capsular opacification PC IOL in good position with open PC   Anterior Vitreous Vitreous syneresis, Posterior vitreous detachment, vitreous condensations Vitreous syneresis, Posterior vitreous detachment, silicone oil micro drops         Fundus Exam       Right Left   Disc pink and sharp, compact mild pallor, sharp, Compact, vascular loops superiorly   C/D Ratio 0.2 0.2   Macula Blunted foveal reflex, Drusen, Pigment clumping and atrophy, central thickening/pigmented disciform scar, +PED, punctate IRH superior macula within atrophy, persistent, trace cystic changes overlying central scar - stable Blunted foveal reflex, central thickening, RPE clumping and atrophy, Drusen, trace cystic changes overlying PED - persistent / slightly improved, no heme   Vessels Vascular attenuation, Tortuous Vascular attenuation, Tortuous   Periphery Attached, mild Reticular degeneration Attached, mild Reticular degeneration           Refraction     Wearing Rx       Sphere Cylinder Add   Right [place] Sphere +3.50   Left -0.50 Sphere +3.50    Type: PAL           IMAGING AND PROCEDURES  Imaging and  Procedures for 03/21/18  OCT, Retina - OU - Both Eyes       Right Eye Quality was good. Central Foveal Thickness: 513. Progression has been stable. Findings include no SRF, abnormal foveal contour, retinal drusen , outer retinal tubulation, subretinal hyper-reflective material, disciform scar, epiretinal membrane, intraretinal fluid, pigment epithelial detachment, outer retinal atrophy (Stable sub-retinal scar, mild persistent cystic changes overlying -- slightly improved).   Left Eye Quality was good. Central Foveal Thickness: 287. Progression has improved. Findings include no SRF, abnormal foveal contour, retinal drusen , subretinal hyper-reflective material, intraretinal fluid, pigment epithelial detachment, outer retinal atrophy (Persistent cystic changes overlying PED/SRHM -- slightly improved).   Notes *Images captured and stored on drive  Diagnosis / Impression:  Exudative ARMD OU OD: Stable sub-retinal scar, mild persistent cystic changes overlying -- slightly improved OS: Persistent cystic changes overlying PED/SRHM -- slightly improved  Clinical management:  See below  Abbreviations: NFP - Normal foveal profile. CME - cystoid macular edema. PED - pigment epithelial detachment. IRF - intraretinal fluid. SRF - subretinal fluid. EZ - ellipsoid zone. ERM - epiretinal membrane. ORA - outer retinal atrophy. ORT - outer retinal tubulation. SRHM - subretinal hyper-reflective material      Intravitreal Injection, Pharmacologic Agent - OS - Left Eye       Time Out 09/09/2022. 9:04 AM. Confirmed correct patient, procedure, site, and patient consented.   Anesthesia Topical anesthesia was used. Anesthetic medications included Lidocaine 2%, Proparacaine 0.5%.   Procedure Preparation included 5% betadine to ocular surface, eyelid speculum. A (32g) needle was used.   Injection: 2 mg aflibercept 2 MG/0.05ML  Route: Intravitreal, Site: Left Eye   NDC: [id number], Lot: [id number],  Expiration date: 01/05/2024, Waste: 0 mL   Post-op Post injection exam found visual acuity of at least counting fingers. The patient tolerated the procedure well. There were no complications. The patient received written and verbal post procedure care education. Post injection medications were not given.            ASSESSMENT/PLAN:    ICD-10-CM   1. Exudative age-related macular degeneration of both eyes with active choroidal neovascularization (HCC)  H35.3231 OCT, Retina - OU - Both Eyes    Intravitreal Injection, Pharmacologic Agent - OS - Left Eye    aflibercept (EYLEA) SOLN 2 mg    2. Posterior vitreous detachment of both eyes  H43.813     3. Essential hypertension  I10     4. Hypertensive retinopathy of both eyes  H35.033     5. Pseudophakia of both eyes  Z96.1     6. Ocular hypertension, bilateral  H40.053     7. Dry eyes  H04.123      1. Exudative age-related macular degeneration, both eyes  - severe exudative disease with very active CNVM OU at presentation in January 2019  - S/P IVA OD #1 (01.04.19), #2 (02.15.19)  - S/P IVA OS #1 (01.18.19), #2 (02.15.19)  - switched to Eylea 3.18.19 due to severity of disease  - S/P IVE OD #1 (03.18.19), #2 (04.17.19), #3 (05.15.19), #4 (10.02.19),  #5 (01.15.20), #6 (06.04.20), #7 (10.29.20), #8 (01.21.21), #9 (05.27.21), #10 (07.22.21) -- injections held due to stable disciform scar  - S/P IVE OS #1 (03.18.19), #2 (04.17.19), #3 (05.15.19), #4 (06.12.19), #5 (07.10.19), #6 (08.07.19), #7 (09.04.19), #8 (10.02.19), #9 (11.06.19), #10 (12.11.19), #11 (01.15.20), #12 (02.20.20), #13 (03.26.20), #14 (04.30.20), #15 (06.04.20), #16 (07.16.20), # 17 (08.20.20), #18 (09.24.20), #19 (10.29.20), #20 (12.03.20), #21 (01.21.21), #22 (03.18.21), #23 (05.27.21), #24 (07.22.21), #25 (11.11.21), #26 (12.09.21), #27 (01.06.22), #28 (2.7.22), #29 (03.24.22), #30 (4.28.22), #31 (05.31.22), #32 (06.30.22), #33 (08.04.22), #34 (09.15.22), #35 (10.27.22),  #36 (12.08.22), #37 (01.12.23), #38 (02.16.23), #39 (03.30.23), #40 (05.11.23), #41 (06.22.23), #42 (07.27.23), #43 (09.01.23)  - IVE OS held from 7.22.21 to 11.11.21 due to TIA/stroke on 8.1.21  - OCT OD: Stable sub-retinal scar, mild persistent cystic changes overlying -- slightly improved; OS: Persistent cystic changes overlying PED/SRHM -- slightly improved  - exam OS shows faint IRH nasal fovea stably resolved, no heme; OD with focal IRH superior macula within atrophy  - BCVA OD stable at CF 3'; OS 20/70 -- stable  - recommend IVE OS #44 today, 10.06.23 w/ f/u 5 wks - will cont to hold OD  - Eylea informed consent form signed and scanned on 01.21.2021  - Good Days approved 12/05/2020 - 12/04/2022  - f/u in 5 weeks, sooner prn for DFE/OCT/possible injection(s)  2. PVD / vitreous syneresis  - Discussed findings and prognosis   - No RT or RD on 360 exam  - Reviewed s/s of RT/RD  - Strict return precautions for any such RT/RD signs/symptoms   3,4. Hypertensive retinopathy OU - pt reports spike in blood pressure at last [place] visit and 2 ED visits w/ significantly elevated BP and headache - BP 186/96 today in office (10.6.23) - exam shows persistent focal IRH OD within GA - discussed importance of tight BP control - monitor  5. Pseudophakia OU  - s/p CE/IOL (Dr. Marisa Hua, 11.2020)  - beautiful surgeries w/ IOLs in excellent position, doing well  - monitor  6. Ocular Hypertension OU  - IOP 18,13 OU  - cont Cosopt BID OU  7. Dry eyes OU - recommend artificial tears and lubricating ointment as needed  Ophthalmic Meds Ordered this visit:  Meds ordered this encounter  Medications   aflibercept (EYLEA) SOLN 2 mg     Return in about 5 weeks (around 10/14/2022) for f/u exu ARMD OU, DFE, OCT.  There are no Patient Instructions on file for this visit.  This document serves as a record of services personally performed by Gardiner Sleeper, MD, PhD. It was created on their  behalf by San Jetty. Owens Shark, OA an ophthalmic technician. The creation of this record is the provider's dictation and/or activities during the visit.    Electronically signed by: San Jetty. Marguerita Merles 09.26.2023 12:33 PM  Gardiner Sleeper, M.D., Ph.D. Diseases & Surgery of the Retina and Vitreous [facility]  I have reviewed the above documentation for accuracy and completeness, and I agree with the above. Gardiner Sleeper, M.D., Ph.D. 09/09/22 12:33 PM  Abbreviations: M myopia (nearsighted); A astigmatism; H hyperopia (farsighted); P presbyopia; Mrx spectacle prescription;  CTL contact lenses; OD right eye; OS left eye; OU both eyes  XT exotropia; ET esotropia; PEK punctate epithelial keratitis; PEE punctate epithelial erosions; DES dry eye syndrome; MGD meibomian gland dysfunction; ATs artificial tears; PFAT's preservative free artificial tears; [place] nuclear sclerotic cataract; PSC posterior subcapsular cataract; ERM epi-retinal membrane; PVD posterior vitreous detachment; RD retinal detachment; DM diabetes mellitus; DR diabetic retinopathy; NPDR non-proliferative diabetic retinopathy; PDR proliferative diabetic retinopathy; CSME clinically significant macular edema; DME diabetic macular edema; dbh dot blot hemorrhages; CWS cotton wool spot; POAG primary open angle glaucoma; C/D cup-to-disc ratio; HVF humphrey visual field; GVF goldmann visual field; OCT optical coherence tomography; IOP intraocular pressure; BRVO Branch retinal vein occlusion; CRVO central retinal vein occlusion; CRAO central retinal artery occlusion; BRAO branch retinal artery occlusion; RT retinal tear; SB scleral buckle; PPV pars plana vitrectomy; VH Vitreous hemorrhage; PRP panretinal laser photocoagulation; IVK intravitreal kenalog; VMT vitreomacular traction; MH Macular hole;  NVD neovascularization of the disc; NVE neovascularization elsewhere; AREDS age related eye disease study; ARMD age related macular  degeneration; POAG primary open angle glaucoma; EBMD epithelial/anterior basement membrane dystrophy; ACIOL anterior chamber intraocular lens; IOL intraocular lens; PCIOL posterior chamber intraocular lens; Phaco/IOL phacoemulsification with intraocular lens placement; [place] photorefractive keratectomy; LASIK laser assisted in situ keratomileusis; HTN hypertension; DM diabetes mellitus; COPD chronic obstructive pulmonary disease

## 2022-08-31 ENCOUNTER — Other Ambulatory Visit (HOSPITAL_COMMUNITY): Payer: Self-pay | Admitting: Hematology

## 2022-08-31 ENCOUNTER — Encounter: Payer: Self-pay | Admitting: *Deleted

## 2022-08-31 ENCOUNTER — Telehealth: Payer: Self-pay | Admitting: *Deleted

## 2022-08-31 NOTE — Patient Outreach (Signed)
  Care Coordination [facility] Note Transition Care Management Follow-up Telephone Call Date of discharge and from where: 08/30/22 from [facility] How have you been since you were released from the hospital? "I'm feeling a whole lot better today. I started on my new pills." Any questions or concerns? No  Items Reviewed: Did the pt receive and understand the discharge instructions provided? Yes  Medications obtained and verified? Yes  Other? Yes Encouraged to continue checking and recording blood pressure daily and PRN and to bring log to PCP appt. Call PCP with any readings outside of recommended range.  Any new allergies since your discharge? No  Dietary orders reviewed? Yes Do you have support at home? Yes   Home Care and Equipment/Supplies: Were home health services ordered? no If so, what is the name of the agency? N/a  Has the agency set up a time to come to the patient's home? not applicable Were any new equipment or medical supplies ordered?  No What is the name of the medical supply agency? N/a Were you able to get the supplies/equipment? not applicable Do you have any questions related to the use of the equipment or supplies? No  Functional Questionnaire: (I = Independent and D = Dependent) ADLs: I  Bathing/Dressing- I  Meal Prep- I  Eating- I  Maintaining continence- I  Transferring/Ambulation- I  Managing Meds- I  Follow up appointments reviewed:  PCP Hospital f/u appt confirmed? Yes  Scheduled to see Dr Lajuana Ripple on 09/06/22 @ 8:00 for routine 3 month f/u [place] f/u appt confirmed? No  not indicated Are transportation arrangements needed? No  If their condition worsens, is the pt aware to call PCP or go to the Emergency Dept.? Yes Was the patient provided with contact information for the PCP's office or ED? Yes Was to pt encouraged to call back with questions or concerns? Yes  SDOH assessments and interventions completed:   Yes  Care Coordination  Interventions Activated:  Yes   Care Coordination Interventions:  PCP follow up appointment requested. Existing appt on 09/06/22 with PCP. Collaborated with Dr Lajuana Ripple regarding whether that appointment slot has sufficient time for a hosp f/u.   Encounter Outcome:  Pt. Visit Completed    Chong Sicilian, BSN, RN-BC RN Care Coordinator [place] Direct Dial: [phone] Main #: [phone]

## 2022-09-02 ENCOUNTER — Encounter (HOSPITAL_COMMUNITY): Payer: Self-pay

## 2022-09-02 ENCOUNTER — Other Ambulatory Visit: Payer: Self-pay

## 2022-09-02 ENCOUNTER — Emergency Department (HOSPITAL_COMMUNITY): Payer: Medicare HMO

## 2022-09-02 ENCOUNTER — Emergency Department (HOSPITAL_COMMUNITY)
Admission: EM | Admit: 2022-09-02 | Discharge: 2022-09-03 | Disposition: A | Discharge status: Home / Self Care | Payer: Medicare HMO | Attending: Emergency Medicine | Admitting: Emergency Medicine

## 2022-09-02 DIAGNOSIS — I491 Atrial premature depolarization: Secondary | ICD-10-CM | POA: Diagnosis not present

## 2022-09-02 DIAGNOSIS — I10 Essential (primary) hypertension: Secondary | ICD-10-CM | POA: Diagnosis not present

## 2022-09-02 DIAGNOSIS — Z79899 Other long term (current) drug therapy: Secondary | ICD-10-CM | POA: Insufficient documentation

## 2022-09-02 DIAGNOSIS — R0689 Other abnormalities of breathing: Secondary | ICD-10-CM | POA: Diagnosis not present

## 2022-09-02 DIAGNOSIS — R519 Headache, unspecified: Secondary | ICD-10-CM | POA: Diagnosis not present

## 2022-09-02 DIAGNOSIS — Z7901 Long term (current) use of anticoagulants: Secondary | ICD-10-CM | POA: Insufficient documentation

## 2022-09-02 LAB — COMPREHENSIVE METABOLIC PANEL
ALT: 26 U/L (ref 0–44)
AST: 28 U/L (ref 15–41)
Albumin: 4 g/dL (ref 3.5–5.0)
Alkaline Phosphatase: 75 U/L (ref 38–126)
Anion gap: 9 (ref 5–15)
BUN: 22 mg/dL (ref 8–23)
CO2: 27 mmol/L (ref 22–32)
Calcium: 10 mg/dL (ref 8.9–10.3)
Chloride: 100 mmol/L (ref 98–111)
Creatinine, Ser: 1.36 mg/dL — ABNORMAL HIGH (ref 0.44–1.00)
GFR, Estimated: 41 mL/min — ABNORMAL LOW (ref >60)
Glucose, Bld: 134 mg/dL — ABNORMAL HIGH (ref 70–99)
Potassium: 3.8 mmol/L (ref 3.5–5.1)
Sodium: 136 mmol/L (ref 135–145)
Total Bilirubin: 0.8 mg/dL (ref 0.3–1.2)
Total Protein: 7.4 g/dL (ref 6.5–8.1)

## 2022-09-02 LAB — CBC WITH DIFFERENTIAL/PLATELET
Abs Immature Granulocytes: 0.02 10*3/uL (ref 0.00–0.07)
Basophils Absolute: 0 10*3/uL (ref 0.0–0.1)
Basophils Relative: 1 %
Eosinophils Absolute: 0.5 10*3/uL (ref 0.0–0.5)
Eosinophils Relative: 7 %
HCT: 39.8 % (ref 36.0–46.0)
Hemoglobin: 13.3 g/dL (ref 12.0–15.0)
Immature Granulocytes: 0 %
Lymphocytes Relative: 22 %
Lymphs Abs: 1.5 10*3/uL (ref 0.7–4.0)
MCH: 31.4 pg (ref 26.0–34.0)
MCHC: 33.4 g/dL (ref 30.0–36.0)
MCV: 94.1 fL (ref 80.0–100.0)
Monocytes Absolute: 0.6 10*3/uL (ref 0.1–1.0)
Monocytes Relative: 9 %
Neutro Abs: 4.2 10*3/uL (ref 1.7–7.7)
Neutrophils Relative %: 61 %
Platelets: 146 10*3/uL — ABNORMAL LOW (ref 150–400)
RBC: 4.23 MIL/uL (ref 3.87–5.11)
RDW: 13.4 % (ref 11.5–15.5)
WBC: 6.7 10*3/uL (ref 4.0–10.5)
nRBC: 0 % (ref 0.0–0.2)

## 2022-09-02 MED ORDER — HYDRALAZINE HCL 25 MG PO TABS
25.0000 mg | ORAL_TABLET | Freq: Once | ORAL | Status: AC
  Administered 2022-09-02: 25 mg via ORAL
  Filled 2022-09-02: qty 1

## 2022-09-02 NOTE — ED Triage Notes (Signed)
Pt BIB [facility] for hypertension, reports mild headache that just started within the last hour, pt otherwise says she has been feeling fine today, just continued getting high BP readings at home, pt was seen for the same on 9/25 and given Rx for new BP meds, reports compliance with taking these as prescribed.

## 2022-09-02 NOTE — ED Notes (Addendum)
ED Provider at bedside. Informed EDP of past two BP readings as they have decreased.

## 2022-09-02 NOTE — ED Provider Notes (Signed)
[facility] EMERGENCY DEPARTMENT Provider Note   CSN: [id number] Arrival date & time: 09/02/22  2116     History  Chief Complaint  Patient presents with   Hypertension    Gabrielle [name] is a 76 y.o. female.   Hypertension   76 year old female presents emergency department with complaints of hypertension and dull frontal headache.  Patient states that she was recently seen in the emergency department for hypertension on 08/29/2022 where medications were adjusted.  She reports taking her at home blood pressure twice a day of which have been normal up until today.  She states that she has a history of arthritis in her back which flared up after increased physical activity over the past day which she presumes elevated her blood pressure.  She reports medical compliance with her blood pressure medicines as prescribed.  She took her nightly dose of medicines before coming to the emergency department today.  Headache is described as dull and frontal in nature and very similar in nature to prior headaches experienced; headache was also experienced before high blood pressure readings measured today..  She notes gradual in onset.  Denies visual disturbance, gait abnormality, weakness/sensory deficits, facial droop, slurred speech.  Denies fever, chills, night sweats, chest pain, shortness of breath, dizziness, lightheadedness, abdominal pain, nausea, vomiting, urinary/vaginal symptoms, change in bowel habits.  Past medical history significant for paroxysmal atrial fibrillation of which patient is on Eliquis, hyperlipidemia, hypertension  Home Medications Prior to Admission medications   Medication Sig Start Date End Date Taking? Authorizing Provider  alendronate (FOSAMAX) 70 MG tablet TAKE ONE TABLET EVERY 7 DAYS ON AN EMPTY STOMACH WITH A FULL GLASS OF WATER 07/04/22   [name], DO  amLODipine (NORVASC) 10 MG tablet Take 1 tablet (10 mg total) by mouth daily. For blood pressure 06/03/22    Gottschalk, Ashly M, DO  atorvastatin (LIPITOR) 20 MG tablet Take 1 tablet (20 mg total) by mouth daily. For cholesterol 06/03/22   [name], DO  cholecalciferol (VITAMIN D3) 25 MCG (1000 UT) tablet Take 1,000 Units by mouth daily.    [provider]  CRANBERRY FRUIT PO Take 1 tablet by mouth daily.    [provider]  diclofenac Sodium (VOLTAREN) 1 % GEL APPLY 4 GRAMS TO AFFECTED AREA(S) 4 TIMES A DAY 04/12/21   Gottschalk, Ashly M, DO  dorzolamide-timolol (COSOPT) 22.3-6.8 MG/ML ophthalmic solution INSTILL ONE DROP IN [facility] EYE TWICE DAILY Patient not taking: Reported on 08/30/2022 05/16/22   [name], MD  ELIQUIS 5 MG TABS tablet TAKE ONE TABLET TWICE DAILY 04/21/22   Doreatha Massed, MD  FEROSUL 325 (65 Fe) MG tablet TAKE  (1)  TABLET TWICE A DAY WITH MEALS (BREAKFAST AND SUPPER) 04/12/21   [name], DO  hydrALAZINE (APRESOLINE) 25 MG tablet Take 1 tablet (25 mg total) by mouth 2 (two) times daily. 08/30/22   Catarina Hartshorn, MD  losartan-hydrochlorothiazide [facility]) 100-25 MG tablet TAKE 1 TABLET DAILY 04/13/22   [name], DO  potassium chloride SA (KLOR-CON M) 20 MEQ tablet TAKE ONE TABLET THREE TIMES DAILY 08/31/22   Doreatha Massed, MD  spironolactone (ALDACTONE) 25 MG tablet Take 1 tablet (25 mg total) by mouth daily. 08/31/22   Catarina Hartshorn, MD      Allergies    Lisinopril    Review of Systems   Review of Systems  All other systems reviewed and are negative.   Physical Exam Updated Vital Signs BP (!) 178/81  Pulse 70   Temp 98.5 F (36.9 C) (Oral)   Resp (!) 21   Ht 5\' 2"  (1.575 m)   Wt 77.1 kg   SpO2 95%   BMI 31.09 kg/m  Physical Exam Vitals and nursing note reviewed.  Constitutional:      General: She is not in acute distress.    Appearance: Normal appearance. She is well-developed. She is not ill-appearing, toxic-appearing or diaphoretic.  HENT:     Head: Normocephalic and atraumatic.  Eyes:     Extraocular  Movements: Extraocular movements intact.     Conjunctiva/sclera: Conjunctivae normal.     Pupils: Pupils are equal, round, and reactive to light.  Cardiovascular:     Rate and Rhythm: Normal rate and regular rhythm.     Heart sounds: No murmur heard. Pulmonary:     Effort: Pulmonary effort is normal. No respiratory distress.     Breath sounds: Normal breath sounds.  Abdominal:     Palpations: Abdomen is soft.     Tenderness: There is no abdominal tenderness.  Musculoskeletal:        General: No swelling.     Cervical back: Neck supple.  Skin:    General: Skin is warm and dry.     Capillary Refill: Capillary refill takes less than 2 seconds.  Neurological:     Mental Status: She is alert.     Comments: Alert and oriented to self, place, time and event.   Speech is fluent, clear without dysarthria or dysphasia.   Strength symmetric in upper/lower extremities   Sensation intact in upper/lower extremities   Negative Romberg. No pronator drift.  Normal finger-to-nose and feet tapping.  CN I not tested  CN II grossly intact visual fields bilaterally. Did not visualize posterior eye.  CN III, IV, VI PERRLA and EOMs intact bilaterally  CN V Intact sensation to sharp and light touch to the face  CN VII facial movements symmetric  CN VIII not tested  CN IX, X no uvula deviation, symmetric rise of soft palate  CN XI 5/5 SCM and trapezius strength bilaterally  CN XII Midline tongue protrusion, symmetric L/R movements     Psychiatric:        Mood and Affect: Mood normal.     ED Results / Procedures / Treatments   Labs (all labs ordered are listed, but only abnormal results are displayed) Labs Reviewed  COMPREHENSIVE METABOLIC PANEL - Abnormal; Notable for the following components:      Result Value   Glucose, Bld 134 (*)    Creatinine, Ser 1.36 (*)    GFR, Estimated 41 (*)    All other components within normal limits  CBC WITH DIFFERENTIAL/PLATELET - Abnormal; Notable for  the following components:   Platelets 146 (*)    All other components within normal limits    EKG None  Radiology CT Head Wo Contrast  Result Date: 09/02/2022 CLINICAL DATA:  Mild headache. EXAM: CT HEAD WITHOUT CONTRAST TECHNIQUE: Contiguous axial images were obtained from the base of the skull through the vertex without intravenous contrast. RADIATION DOSE REDUCTION: This exam was performed according to the departmental dose-optimization program which includes automated exposure control, adjustment of the mA and/or kV according to patient size and/or use of iterative reconstruction technique. COMPARISON:  August 29, 2022 FINDINGS: Brain: There is mild cerebral atrophy with widening of the extra-axial spaces and ventricular dilatation. There are areas of decreased attenuation within the white matter tracts of the supratentorial brain, consistent with  microvascular disease changes. Vascular: No hyperdense vessel or unexpected calcification. Skull: Normal. Negative for fracture or focal lesion. Sinuses/Orbits: No acute finding. Other: None. IMPRESSION: 1. No acute intracranial abnormality. 2. Generalized cerebral atrophy and microvascular disease changes of the supratentorial brain. Electronically Signed   By: Aram Candela M.D.   On: 09/02/2022 22:34    Procedures Procedures    Medications Ordered in ED Medications  hydrALAZINE (APRESOLINE) tablet 25 mg (25 mg Oral Given 09/02/22 2220)    ED Course/ Medical Decision Making/ A&P                           Medical Decision Making Amount and/or Complexity of Data Reviewed Labs: ordered. Radiology: ordered.  Risk Prescription drug management.   This patient presents to the ED for concern of hypertension, this involves an extensive number of treatment options, and is a complaint that carries with it a high risk of complications and morbidity.  The differential diagnosis includes hypertension, CVA,   Co morbidities that complicate  the patient evaluation  See HPI   Additional history obtained:  Additional history obtained from EMR External records from outside source obtained and reviewed including CT imaging from 08/29/2022   Lab Tests:  I Ordered, and personally interpreted labs.  The pertinent results include: No leukocytosis noted.  No evidence of anemia.  Platelets within range.  No electrolyte abnormalities.  Renal function slightly decreased with creatinine 1.36 and GFR 41 and BUN of 22.   Imaging Studies ordered:  I ordered imaging studies including CT head I independently visualized and interpreted imaging which showed no acute intracranial abnormality.  Generalized cerebral atrophy and microvascular disease changes of the supratentorial brain. I agree with the radiologist interpretation  Cardiac Monitoring: / EKG:  The patient was maintained on a cardiac monitor.  I personally viewed and interpreted the cardiac monitored which showed an underlying rhythm of: Sinus rhythm   Consultations Obtained:  N/a   Problem List / ED Course / Critical interventions / Medication management  Hypertension I ordered medication including hydralazine for antihypertensive Reevaluation of the patient after these medicines showed that the patient improved I have reviewed the patients home medicines and have made adjustments as needed   Social Determinants of Health:  Denies tobacco, illicit drug use   Test / Admission - Considered:  Hypertension Vitals signs significant for initial blood pressure of 198/86.  After administration of antihypertensive in the emergency department, blood pressure decreased to 178/81.  Otherwise within normal range and stable throughout visit. Laboratory/imaging studies significant for: See above Patient has known history of longstanding hypertension.  She has had multiple emergency department as well as office visits outpatient with elevated blood pressure in range measured  today with blood pressure 190/110 at her oncology office on 8/30 and again during her infusion of 188/90 on 9/20.  Patient recommended close follow-up with PCP outpatient for further blood pressure management.  She was recommended administrating at home hydralazine if blood pressure does not respond to at home medicines as prescribed after significant time has elapsed.  Strict return's precautions were discussed at length with patient regarding worrisome signs of elevated blood pressure.  Treatment plan discussed with patient she acknowledged understanding was agreeable to said plan. Worrisome signs and symptoms were discussed with the patient, and the patient acknowledged understanding to return to the ED if noticed. Patient was stable upon discharge.         Final Clinical Impression(s) /  ED Diagnoses Final diagnoses:  Hypertension, unspecified type    Rx / DC Orders ED Discharge Orders     None         Peter Garter, [place] 09/02/22 2309    Rondel Baton, MD 09/03/22 [phone]

## 2022-09-02 NOTE — Discharge Instructions (Addendum)
Note your work-up today was overall reassuring.  Your high blood pressure was treated in the emergency department with the same medicine you have at home COVID hydralazine.  I suggest taking this medicine when your blood pressure is greater than 338 systolic.  Keep your upcoming appointment with your primary care on Tuesday for further blood pressure management.  Please do not hesitate to return to emergency department if the worrisome signs and symptoms we discussed become apparent.

## 2022-09-02 NOTE — ED Notes (Signed)
Pt transported to CT on monitor.

## 2022-09-05 LAB — ALDOSTERONE + RENIN ACTIVITY W/ RATIO
ALDO / PRA Ratio: 71.5 — ABNORMAL HIGH (ref 0.0–30.0)
Aldosterone: 15.8 ng/dL (ref 0.0–30.0)
PRA LC/MS/MS: 0.221 ng/mL/hr (ref 0.167–5.380)

## 2022-09-06 ENCOUNTER — Encounter: Payer: Self-pay | Admitting: Family Medicine

## 2022-09-06 ENCOUNTER — Ambulatory Visit (INDEPENDENT_AMBULATORY_CARE_PROVIDER_SITE_OTHER): Payer: Medicare HMO | Admitting: Family Medicine

## 2022-09-06 VITALS — BP 182/78 | HR 66 | Temp 98.2°F | Ht 62.0 in | Wt 170.6 lb

## 2022-09-06 DIAGNOSIS — N183 Chronic kidney disease, stage 3 unspecified: Secondary | ICD-10-CM | POA: Diagnosis not present

## 2022-09-06 DIAGNOSIS — Z23 Encounter for immunization: Secondary | ICD-10-CM | POA: Diagnosis not present

## 2022-09-06 DIAGNOSIS — I10 Essential (primary) hypertension: Secondary | ICD-10-CM | POA: Diagnosis not present

## 2022-09-06 DIAGNOSIS — I129 Hypertensive chronic kidney disease with stage 1 through stage 4 chronic kidney disease, or unspecified chronic kidney disease: Secondary | ICD-10-CM | POA: Diagnosis not present

## 2022-09-06 NOTE — Progress Notes (Signed)
Subjective: CC: Hypertension PCP: Gabrielle Norlander, DO [id number] Gabrielle [name] is a 76 y.o. female presenting to clinic today for:  1.  Hypertension associated with CKD 3 Patient reports that she has been seen in the ER x2 for elevated blood pressure.  Initially she felt like she was having a mild headache but really has not had any symptoms since that time.  Denies any dizziness, chest pain, shortness of breath, change in urine output or lower extremity edema.  She is compliant with the losartan-hydrochlorothiazide 100-25 mg daily, spironolactone 25 mg daily that was recently added by the ER as well as hydralazine 25 mg twice daily that was added by the ER.  Blood pressures remain above goal.  She denies use of amlodipine but I did contact her pharmacist and this was recently picked up in September.  She does not bring this bottle with her today and does not recall actually taking the medicine.  Denies any salt use.   ROS: Per HPI  Allergies  Allergen Reactions   Lisinopril Cough   Past Medical History:  Diagnosis Date   Arthritis    Cancer ([place])    Cataract    OU   Colon cancer ([place])    Family history of pancreatic cancer 08/21/2020   Family history of uterine cancer 08/21/2020   H/O cesarean section    Hx of tonsillectomy    Hyperlipidemia    Hypertension    Macular degeneration    Exu ARMD OU   Paroxysmal atrial fibrillation ([name]) 07/05/2020    Current Outpatient Medications:    alendronate (FOSAMAX) 70 MG tablet, TAKE ONE TABLET EVERY 7 DAYS ON AN EMPTY STOMACH WITH A FULL GLASS OF WATER, Disp: 12 tablet, Rfl: 0   atorvastatin (LIPITOR) 20 MG tablet, Take 1 tablet (20 mg total) by mouth daily. For cholesterol, Disp: 90 tablet, Rfl: 3   cholecalciferol (VITAMIN D3) 25 MCG (1000 UT) tablet, Take 1,000 Units by mouth daily., Disp: , Rfl:    CRANBERRY FRUIT PO, Take 1 tablet by mouth daily., Disp: , Rfl:    diclofenac Sodium (VOLTAREN) 1 % GEL, APPLY 4 GRAMS TO AFFECTED  AREA(S) 4 TIMES A DAY, Disp: 400 g, Rfl: 2   ELIQUIS 5 MG TABS tablet, TAKE ONE TABLET TWICE DAILY, Disp: 60 tablet, Rfl: 6   FEROSUL 325 (65 Fe) MG tablet, TAKE  (1)  TABLET TWICE A DAY WITH MEALS (BREAKFAST AND SUPPER), Disp: 180 tablet, Rfl: 0   hydrALAZINE (APRESOLINE) 25 MG tablet, Take 1 tablet (25 mg total) by mouth 2 (two) times daily., Disp: 60 tablet, Rfl: 1   losartan-hydrochlorothiazide (HYZAAR) 100-25 MG tablet, TAKE 1 TABLET DAILY, Disp: 90 tablet, Rfl: 3   potassium chloride SA (KLOR-CON M) 20 MEQ tablet, TAKE ONE TABLET THREE TIMES DAILY, Disp: 90 tablet, Rfl: 3   spironolactone (ALDACTONE) 25 MG tablet, Take 1 tablet (25 mg total) by mouth daily., Disp: 30 tablet, Rfl: 1   amLODipine (NORVASC) 10 MG tablet, Take 1 tablet (10 mg total) by mouth daily. For blood pressure (Patient not taking: Reported on 09/06/2022), Disp: 90 tablet, Rfl: 3   dorzolamide-timolol (COSOPT) 22.3-6.8 MG/ML ophthalmic solution, INSTILL ONE DROP IN [facility] EYE TWICE DAILY (Patient not taking: Reported on 08/30/2022), Disp: 10 mL, Rfl: 3 Social History   Socioeconomic History   Marital status: Divorced    Spouse name: Not on file   Number of children: Not on file   Years of education: Not on file  Highest education level: Not on file  Occupational History   Not on file  Tobacco Use   Smoking status: Never   Smokeless tobacco: Never  Vaping Use   Vaping Use: Never used  Substance and Sexual Activity   Alcohol use: No   Drug use: No   Sexual activity: Not Currently  Other Topics Concern   Not on file  Social History Narrative   Not on file   Social Determinants of Health   Financial Resource Strain: Low Risk  (08/31/2022)   Overall Financial Resource Strain (CARDIA)    Difficulty of Paying Living Expenses: Not hard at all  Food Insecurity: No Food Insecurity (02/21/2022)   Hunger Vital Sign    Worried About Running Out of Food in the Last Year: Never true    Ran Out of Food in the Last Year:  Never true  Transportation Needs: No Transportation Needs (08/31/2022)   PRAPARE - Hydrologist (Medical): No    Lack of Transportation (Non-Medical): No  Physical Activity: Sufficiently Active (02/21/2022)   Exercise Vital Sign    Days of Exercise per Week: 5 days    Minutes of Exercise per Session: 30 min  Stress: No Stress Concern Present (02/21/2022)   [place]    Feeling of Stress : Not at all  Social Connections: Moderately Isolated (02/21/2022)   Social Connection and Isolation Panel [NHANES]    Frequency of Communication with Friends and Family: More than three times a week    Frequency of Social Gatherings with Friends and Family: More than three times a week    Attends Religious Services: 1 to 4 times per year    Active Member of [employer] or Organizations: No    Attends Archivist Meetings: Never    Marital Status: Divorced  Human resources officer Violence: Not At Risk (02/21/2022)   Humiliation, Afraid, Rape, and Kick questionnaire    Fear of Current or Ex-Partner: No    Emotionally Abused: No    Physically Abused: No    Sexually Abused: No   Family History  Problem Relation Age of Onset   Macular degeneration Mother    Stroke Mother    Hypertension Mother    Dementia Mother    Lung cancer Father        dx late 25s; smoking hx   Diabetes Sister    Hypertension Sister    Leukemia Paternal Uncle        d. 76s   Cancer Maternal Aunt        ovarian or endometrial dx 79s   Pancreatic cancer Paternal Uncle        d. late 78s    Objective: Office vital signs reviewed. BP (!) 182/78   Pulse 66   Temp 98.2 F (36.8 C)   Ht '5\' 2"'$  (1.575 m)   Wt 170 lb 9.6 oz (77.4 kg)   SpO2 98%   BMI 31.20 kg/m   Physical Examination:  General: Awake, alert, nontoxic female, No acute distress HEENT: Sclera white.  Moist mucous membranes Cardio: regular rate and rhythm, S1S2 heard,  no murmurs appreciated Pulm: clear to auscultation bilaterally, no wheezes, rhonchi or rales; normal work of breathing on room air Extremities: warm, well perfused, No edema, cyanosis or clubbing; +2 pulses bilaterally Neuro: No focal neurologic deficits  Assessment/ Plan: 76 y.o. female   Essential hypertension - Plan: Basic Metabolic Panel  Stage 3 chronic  kidney disease due to benign hypertension ([place]) - Plan: Basic Metabolic Panel  Need for immunization against influenza - Plan: Flu Vaccine QUAD High Dose(Fluad)  Blood pressure is not controlled.  Given uncertainty as to what her blood pressure regimen actually is at home I have asked that she go through all of her prescriptions at home and bring any bottles that she has to the office so that I can review them and/or triage can go through them.  She should be on Norvasc 10 mg daily in addition to the hyzaar.  I certainly think that there is room for the hydralazine and spironolactone assuming that her blood pressure is uncontrolled.  However, I do want to recheck her potassium level and renal function as there does seem to be a slight change in that since initiation of the spironolactone.  May need to consider ultimately advancing the hydralazine pending the blood pressure response.  I also discussed with her pharmacist that she may be a good candidate for pill packs and she is uncertain as to what medicine she is taking and this seems to be primarily due to her visual impairment related to glaucoma.  She seemed amenable to pill pack  No orders of the defined types were placed in this encounter.  No orders of the defined types were placed in this encounter.    Gabrielle Norlander, DO [place] [phone]

## 2022-09-06 NOTE — Patient Instructions (Addendum)
CHECK YOUR MEDS AGAIN You should be taking: Amlodipine Spironolactone Losartan-HCTZ Hydralazine  I called the pharmacy and they said you picked up the Amlodipine last month Ask them about pill pak.

## 2022-09-07 ENCOUNTER — Telehealth: Payer: Self-pay

## 2022-09-07 ENCOUNTER — Encounter: Payer: Self-pay | Admitting: Hematology

## 2022-09-07 LAB — BASIC METABOLIC PANEL
BUN/Creatinine Ratio: 21 (ref 12–28)
BUN: 26 mg/dL (ref 8–27)
CO2: 18 mmol/L — ABNORMAL LOW (ref 20–29)
Calcium: 10.3 mg/dL (ref 8.7–10.3)
Chloride: 103 mmol/L (ref 96–106)
Creatinine, Ser: 1.23 mg/dL — ABNORMAL HIGH (ref 0.57–1.00)
Glucose: 113 mg/dL — ABNORMAL HIGH (ref 70–99)
Potassium: 4.5 mmol/L (ref 3.5–5.2)
Sodium: 140 mmol/L (ref 134–144)
eGFR: 46 mL/min/{1.73_m2} — ABNORMAL LOW (ref >59)

## 2022-09-07 NOTE — Telephone Encounter (Signed)
        Patient  visited [facility] on 9/30    Telephone encounter attempt :  1st  A HIPAA compliant voice message was left requesting a return call.  Instructed patient to call back    Donnavin Vandenbrink [facility] Care Guide, [place], Care Management  [phone] [address], [place], [place] [address] Phone: [phone] Email: [email or web address]       

## 2022-09-09 ENCOUNTER — Ambulatory Visit (INDEPENDENT_AMBULATORY_CARE_PROVIDER_SITE_OTHER): Payer: Medicare HMO | Admitting: Ophthalmology

## 2022-09-09 ENCOUNTER — Encounter (INDEPENDENT_AMBULATORY_CARE_PROVIDER_SITE_OTHER): Payer: Self-pay | Admitting: Ophthalmology

## 2022-09-09 VITALS — BP 186/96 | HR 65

## 2022-09-09 DIAGNOSIS — I10 Essential (primary) hypertension: Secondary | ICD-10-CM

## 2022-09-09 DIAGNOSIS — H35033 Hypertensive retinopathy, bilateral: Secondary | ICD-10-CM

## 2022-09-09 DIAGNOSIS — Z961 Presence of intraocular lens: Secondary | ICD-10-CM

## 2022-09-09 DIAGNOSIS — H43813 Vitreous degeneration, bilateral: Secondary | ICD-10-CM

## 2022-09-09 DIAGNOSIS — H353231 Exudative age-related macular degeneration, bilateral, with active choroidal neovascularization: Secondary | ICD-10-CM

## 2022-09-09 DIAGNOSIS — H04123 Dry eye syndrome of bilateral lacrimal glands: Secondary | ICD-10-CM

## 2022-09-09 DIAGNOSIS — H40053 Ocular hypertension, bilateral: Secondary | ICD-10-CM | POA: Diagnosis not present

## 2022-09-09 MED ORDER — AFLIBERCEPT 2MG/0.05ML IZ SOLN FOR KALEIDOSCOPE
2.0000 mg | INTRAVITREAL | Status: AC | PRN
  Administered 2022-09-09: 2 mg via INTRAVITREAL

## 2022-09-12 ENCOUNTER — Other Ambulatory Visit: Payer: Self-pay

## 2022-09-12 DIAGNOSIS — C189 Malignant neoplasm of colon, unspecified: Secondary | ICD-10-CM

## 2022-09-13 ENCOUNTER — Other Ambulatory Visit: Payer: Self-pay | Admitting: Family Medicine

## 2022-09-13 ENCOUNTER — Inpatient Hospital Stay: Payer: Medicare HMO

## 2022-09-13 ENCOUNTER — Inpatient Hospital Stay: Payer: Medicare HMO | Attending: Hematology

## 2022-09-13 VITALS — BP 194/82 | HR 72 | Temp 97.2°F | Resp 20 | Wt 172.8 lb

## 2022-09-13 DIAGNOSIS — Z79899 Other long term (current) drug therapy: Secondary | ICD-10-CM | POA: Diagnosis not present

## 2022-09-13 DIAGNOSIS — C182 Malignant neoplasm of ascending colon: Secondary | ICD-10-CM | POA: Insufficient documentation

## 2022-09-13 DIAGNOSIS — C189 Malignant neoplasm of colon, unspecified: Secondary | ICD-10-CM

## 2022-09-13 DIAGNOSIS — Z5112 Encounter for antineoplastic immunotherapy: Secondary | ICD-10-CM | POA: Insufficient documentation

## 2022-09-13 LAB — CBC WITH DIFFERENTIAL/PLATELET
Abs Immature Granulocytes: 0.02 10*3/uL (ref 0.00–0.07)
Basophils Absolute: 0 10*3/uL (ref 0.0–0.1)
Basophils Relative: 1 %
Eosinophils Absolute: 0.2 10*3/uL (ref 0.0–0.5)
Eosinophils Relative: 4 %
HCT: 40.8 % (ref 36.0–46.0)
Hemoglobin: 13.6 g/dL (ref 12.0–15.0)
Immature Granulocytes: 0 %
Lymphocytes Relative: 25 %
Lymphs Abs: 1.4 10*3/uL (ref 0.7–4.0)
MCH: 31.6 pg (ref 26.0–34.0)
MCHC: 33.3 g/dL (ref 30.0–36.0)
MCV: 94.9 fL (ref 80.0–100.0)
Monocytes Absolute: 0.5 10*3/uL (ref 0.1–1.0)
Monocytes Relative: 10 %
Neutro Abs: 3.4 10*3/uL (ref 1.7–7.7)
Neutrophils Relative %: 60 %
Platelets: 145 10*3/uL — ABNORMAL LOW (ref 150–400)
RBC: 4.3 MIL/uL (ref 3.87–5.11)
RDW: 13.1 % (ref 11.5–15.5)
WBC: 5.5 10*3/uL (ref 4.0–10.5)
nRBC: 0 % (ref 0.0–0.2)

## 2022-09-13 LAB — COMPREHENSIVE METABOLIC PANEL
ALT: 28 U/L (ref 0–44)
AST: 27 U/L (ref 15–41)
Albumin: 4.3 g/dL (ref 3.5–5.0)
Alkaline Phosphatase: 72 U/L (ref 38–126)
Anion gap: 10 (ref 5–15)
BUN: 28 mg/dL — ABNORMAL HIGH (ref 8–23)
CO2: 23 mmol/L (ref 22–32)
Calcium: 9.8 mg/dL (ref 8.9–10.3)
Chloride: 102 mmol/L (ref 98–111)
Creatinine, Ser: 1.28 mg/dL — ABNORMAL HIGH (ref 0.44–1.00)
GFR, Estimated: 44 mL/min — ABNORMAL LOW (ref >60)
Glucose, Bld: 126 mg/dL — ABNORMAL HIGH (ref 70–99)
Potassium: 4 mmol/L (ref 3.5–5.1)
Sodium: 135 mmol/L (ref 135–145)
Total Bilirubin: 0.7 mg/dL (ref 0.3–1.2)
Total Protein: 7.8 g/dL (ref 6.5–8.1)

## 2022-09-13 LAB — TSH: TSH: 2.302 u[IU]/mL (ref 0.350–4.500)

## 2022-09-13 MED ORDER — SODIUM CHLORIDE 0.9% FLUSH: 10.0000 mL | INTRAVENOUS | Status: DC | PRN

## 2022-09-13 MED ORDER — HEPARIN SOD (PORK) LOCK FLUSH 100 UNIT/ML IV SOLN: 500.0000 [IU] | Freq: Once | INTRAVENOUS | Status: DC | PRN

## 2022-09-13 MED ORDER — SODIUM CHLORIDE 0.9 % IV SOLN: Freq: Once | INTRAVENOUS | Status: AC

## 2022-09-13 MED ORDER — SODIUM CHLORIDE 0.9 % IV SOLN
200.0000 mg | Freq: Once | INTRAVENOUS | Status: AC
  Administered 2022-09-13: 200 mg via INTRAVENOUS
  Filled 2022-09-13: qty 8

## 2022-09-13 NOTE — Progress Notes (Signed)
Per Haskell Flirt CNS patient will need to get a new port placed due to port tip in the innominate vein. MD made aware, patient is aware and referral made to surgeon for consult. Dr. Constance Haw

## 2022-09-13 NOTE — Patient Instructions (Signed)
[facility]  Discharge Instructions: Thank you for choosing [facility] to provide your oncology and hematology care.  If you have a lab appointment with the Cancer Center, please come in thru the Main Entrance and check in at the main information desk.  Wear comfortable clothing and clothing appropriate for easy access to any Portacath or PICC line.   We strive to give you quality time with your provider. You may need to reschedule your appointment if you arrive late (15 or more minutes).  Arriving late affects you and other patients whose appointments are after yours.  Also, if you miss three or more appointments without notifying the office, you may be dismissed from the clinic at the provider's discretion.      For prescription refill requests, have your pharmacy contact our office and allow 72 hours for refills to be completed.    Today you received the following chemotherapy and/or immunotherapy agents Keytruda. Pembrolizumab Injection What is this medication? PEMBROLIZUMAB (PEM broe LIZ ue mab) treats some types of cancer. It works by helping your immune system slow or stop the spread of cancer cells. It is a monoclonal antibody. This medicine may be used for other purposes; ask your health care provider or pharmacist if you have questions. COMMON BRAND NAME(S): Keytruda What should I tell my care team before I take this medication? They need to know if you have any of these conditions: Allogeneic stem cell transplant (uses someone else's stem cells) Autoimmune diseases, such as Crohn disease, ulcerative colitis, lupus History of chest radiation Nervous system problems, such as Guillain-Barre syndrome, myasthenia gravis Organ transplant An unusual or allergic reaction to pembrolizumab, other medications, foods, dyes, or preservatives Pregnant or trying to get pregnant Breast-feeding How should I use this medication? This medication is injected  into a vein. It is given by your care team in a hospital or clinic setting. A special MedGuide will be given to you before each treatment. Be sure to read this information carefully each time. Talk to your care team about the use of this medication in children. While it may be prescribed for children as young as 6 months for selected conditions, precautions do apply. Overdosage: If you think you have taken too much of this medicine contact a poison control center or emergency room at once. NOTE: This medicine is only for you. Do not share this medicine with others. What if I miss a dose? Keep appointments for follow-up doses. It is important not to miss your dose. Call your care team if you are unable to keep an appointment. What may interact with this medication? Interactions have not been studied. This list may not describe all possible interactions. Give your health care provider a list of all the medicines, herbs, non-prescription drugs, or dietary supplements you use. Also tell them if you smoke, drink alcohol, or use illegal drugs. Some items may interact with your medicine. What should I watch for while using this medication? Your condition will be monitored carefully while you are receiving this medication. You may need blood work while taking this medication. This medication may cause serious skin reactions. They can happen weeks to months after starting the medication. Contact your care team right away if you notice fevers or flu-like symptoms with a rash. The rash may be red or purple and then turn into blisters or peeling of the skin. You may also notice a red rash with swelling of the face, lips, or lymph nodes   in your neck or under your arms. Tell your care team right away if you have any change in your eyesight. Talk to your care team if you may be pregnant. Serious birth defects can occur if you take this medication during pregnancy and for 4 months after the last dose. You will need a  negative pregnancy test before starting this medication. Contraception is recommended while taking this medication and for 4 months after the last dose. Your care team can help you find the option that works for you. Do not breastfeed while taking this medication and for 4 months after the last dose. What side effects may I notice from receiving this medication? Side effects that you should report to your care team as soon as possible: Allergic reactions--skin rash, itching, hives, swelling of the face, lips, tongue, or throat Dry cough, shortness of breath or trouble breathing Eye pain, redness, irritation, or discharge with blurry or decreased vision Heart muscle inflammation--unusual weakness or fatigue, shortness of breath, chest pain, fast or irregular heartbeat, dizziness, swelling of the ankles, feet, or hands Hormone gland problems--headache, sensitivity to light, unusual weakness or fatigue, dizziness, fast or irregular heartbeat, increased sensitivity to cold or heat, excessive sweating, constipation, hair loss, increased thirst or amount of urine, tremors or shaking, irritability Infusion reactions--chest pain, shortness of breath or trouble breathing, feeling faint or lightheaded Kidney injury (glomerulonephritis)--decrease in the amount of urine, red or dark brown urine, foamy or bubbly urine, swelling of the ankles, hands, or feet Liver injury--right upper belly pain, loss of appetite, nausea, light-colored stool, dark yellow or brown urine, yellowing skin or eyes, unusual weakness or fatigue Pain, tingling, or numbness in the hands or feet, muscle weakness, change in vision, confusion or trouble speaking, loss of balance or coordination, trouble walking, seizures Rash, fever, and swollen lymph nodes Redness, blistering, peeling, or loosening of the skin, including inside the mouth Sudden or severe stomach pain, bloody diarrhea, fever, nausea, vomiting Side effects that usually do not  require medical attention (report to your care team if they continue or are bothersome): Bone, joint, or muscle pain Diarrhea Fatigue Loss of appetite Nausea Skin rash This list may not describe all possible side effects. Call your doctor for medical advice about side effects. You may report side effects to FDA at 1-800-FDA-1088. Where should I keep my medication? This medication is given in a hospital or clinic. It will not be stored at home. NOTE: This sheet is a summary. It may not cover all possible information. If you have questions about this medicine, talk to your doctor, pharmacist, or health care provider.  2023 Elsevier/Gold Standard (2022-03-14 00:00:00)       To help prevent nausea and vomiting after your treatment, we encourage you to take your nausea medication as directed.  BELOW ARE SYMPTOMS THAT SHOULD BE REPORTED IMMEDIATELY: *FEVER GREATER THAN 100.4 F (38 C) OR HIGHER *CHILLS OR SWEATING *NAUSEA AND VOMITING THAT IS NOT CONTROLLED WITH YOUR NAUSEA MEDICATION *UNUSUAL SHORTNESS OF BREATH *UNUSUAL BRUISING OR BLEEDING *URINARY PROBLEMS (pain or burning when urinating, or frequent urination) *BOWEL PROBLEMS (unusual diarrhea, constipation, pain near the anus) TENDERNESS IN MOUTH AND THROAT WITH OR WITHOUT PRESENCE OF ULCERS (sore throat, sores in mouth, or a toothache) UNUSUAL RASH, SWELLING OR PAIN  UNUSUAL VAGINAL DISCHARGE OR ITCHING   Items with * indicate a potential emergency and should be followed up as soon as possible or go to the Emergency Department if any problems should occur.  Please show   the CHEMOTHERAPY ALERT CARD or IMMUNOTHERAPY ALERT CARD at check-in to the Emergency Department and triage nurse.  Should you have questions after your visit or need to cancel or reschedule your appointment, please contact [facility] [phone]  and follow the prompts.  Office hours are 8:00 a.m. to 4:30 p.m. Monday - Friday. Please note that  voicemails left after 4:00 p.m. may not be returned until the following business day.  We are closed weekends and major holidays. You have access to a nurse at all times for urgent questions. Please call the main number to the clinic [phone] and follow the prompts.  For any non-urgent questions, you may also contact your provider using MyChart. We now offer e-Visits for anyone 18 and older to request care online for non-urgent symptoms. For details visit [email or web address].   Also download the MyChart app! Go to the app store, search "MyChart", open the app, select [facility], and log in with your MyChart username and password.  Masks are optional in the cancer centers. If you would like for your care team to wear a mask while they are taking care of you, please let them know. You may have one support person who is at least 76 years old accompany you for your appointments.  

## 2022-09-13 NOTE — Progress Notes (Signed)
Patient presents today for Keytruda infusion. Labs within parameters for today's treatment. Patient had no complaints of any changes related to treatment since her last visit. Patient has had issues with her blood pressure and presented to the ED and her PCP at [facility] changed her blood pressure medications per patient's words.   Patient' blood pressure on arrival elevated. Recheck at 175/83. Message sent to Dr. Linward Headland. Anderson RN pertaining to blood pressure and dye study/ port placement for treatment.    Message received from Dr. Delton Coombes to proceed with treatment, note placed in chart by Dr. Delton Coombes to use port today.  Per [facility] message sent to charge RN for treatment today use peripheral due to port tip placement from 09/28/21.  Keytruda given peripheral today per MD orders. Tolerated infusion without adverse affects. Vital signs stable. Blood pressure elevated at discharge. Message received from Dr. Delton Coombes may discharge patient home . No complaints at this time. Patient states, she is going by her PCP's office in [place] on her way home to discuss the new blood pressure medications. Discharged from clinic ambulatory in stable condition. Alert and oriented x 3. F/U with [facility] as scheduled.

## 2022-09-13 NOTE — Progress Notes (Signed)
It is reported by infusion nurse that patient's port does not give blood, but flushes w/o difficulty, with no signs of infiltration at site, and no c/o pain from the patient when flushing. It is okay to proceed with using port for venous access.

## 2022-09-14 ENCOUNTER — Inpatient Hospital Stay: Payer: Medicare HMO

## 2022-09-15 LAB — CEA: CEA: 1.4 ng/mL (ref 0.0–4.7)

## 2022-09-20 ENCOUNTER — Encounter: Payer: Self-pay | Admitting: General Surgery

## 2022-09-20 ENCOUNTER — Ambulatory Visit: Payer: Medicare HMO | Admitting: General Surgery

## 2022-09-20 VITALS — BP 167/88 | HR 83 | Temp 97.4°F | Resp 16 | Ht 62.0 in | Wt 171.0 lb

## 2022-09-20 DIAGNOSIS — Z95828 Presence of other vascular implants and grafts: Secondary | ICD-10-CM

## 2022-09-20 NOTE — Progress Notes (Signed)
[facility] History and Physical  Reason for Referral: Port in place  Referring Physician: Oncology Department, Dr. Delton Coombes unaware   Chief Complaint   Replace port     Gabrielle [name] is a 76 y.o. female.  HPI: Gabrielle [name] is a 76 yo who had colon cancer s/p resection in 2021 and port placement by me at that time. She has been getting Keytruda for therapy and doing well with this over the past several years. She has been getting treatment and denies any pain or issues. There has been no infiltration of the port. Her CXR post procedure showed the catheter in good position.   The patient had a CT chest in 09/2021 where there was the report of the catheter being in the left innominate vein and then in 12/2021 she had her port injected with concern for fibrin sheath or thrombus after there was no blood flow back.  She had alteplase done in 12/2020, 05/2021, and 12/2021. She then had her catheter reused 12/2021 without issues.   Again she has never complained of pain or issue with the catheter.    At her last infusion 09/2022 she had her catheter questioned by the RN and the catheter did not draw back blood, but it did flow and there was no pain or infiltration.  This was told to Dr. Delton Coombes and felt that the catheter could be used. Based on the documentation the RN proceeded to place an PIV and did the treatment through the PIV not the port despite Dr. Delton Coombes saying it was ok.  The patient was unaware of exactly what was going on she reports to me.   Past Medical History:  Diagnosis Date   Arthritis    Cancer [facility])    Cataract    OU   Colon cancer ([place])    Family history of pancreatic cancer 08/21/2020   Family history of uterine cancer 08/21/2020   H/O cesarean section    Hx of tonsillectomy    Hyperlipidemia    Hypertension    Macular degeneration    Exu [facility]MD OU   Paroxysmal atrial fibrillation ([place]) 07/05/2020    Past Surgical History:  Procedure  Laterality Date   BIOPSY  04/06/2020   Procedure: BIOPSY;  Surgeon: Rogene Houston, MD;  Location: [facility];  Service: Endoscopy;;   CARPAL TUNNEL RELEASE Right    CATARACT EXTRACTION W/PHACO Left 10/11/2019   Procedure: CATARACT EXTRACTION PHACO AND INTRAOCULAR LENS PLACEMENT ([place]);  Surgeon: Baruch Goldmann, MD;  Location: [facility] ORS;  Service: Ophthalmology;  Laterality: Left;  CDE: 8.56   CATARACT EXTRACTION W/PHACO Right 10/25/2019   Procedure: CATARACT EXTRACTION PHACO AND INTRAOCULAR LENS PLACEMENT ([facility]);  Surgeon: Baruch Goldmann, MD;  Location: [facility] ORS;  Service: Ophthalmology;  Laterality: Right;  CDE: 5.67   CESAREAN SECTION     COLONOSCOPY N/A 04/06/2020   Procedure: COLONOSCOPY;  Surgeon: Rogene Houston, MD;  Location: [facility];  Service: Endoscopy;  Laterality: N/A;   CYSTOSCOPY WITH BIOPSY N/A 04/08/2020   Procedure: CYSTOSCOPY WITH BIOPSY;  Surgeon: Cleon Gustin, MD;  Location: [facility] ORS;  Service: Urology;  Laterality: N/A;   ESOPHAGOGASTRODUODENOSCOPY N/A 04/05/2020   Procedure: ESOPHAGOGASTRODUODENOSCOPY ([facility]);  Surgeon: Rogene Houston, MD;  [facility]: [facility];  Service: Endoscopy;  Laterality: N/A;   PARTIAL COLECTOMY N/A 04/08/2020   Procedure: PARTIAL COLECTOMY;  Surgeon: Virl Cagey, MD;  Location: [facility] OR[facility];  Service: General;  Laterality: N/A;   PORTACATH PLACEMENT Left  05/18/2020   Procedure: INSERTION PORT-A-CATH (ATTACHED CATHETER IN LEFT SUBCLAVIAN);  Surgeon: Virl Cagey, MD;  Location: [facility] OR[facility];  Service: General;  Laterality: Left;   TONSILLECTOMY      Family History  Problem Relation Age of Onset   Macular degeneration Mother    Stroke Mother    Hypertension Mother    Dementia Mother    Lung cancer Father        dx late 16s; smoking hx   Diabetes Sister    Hypertension Sister    Leukemia Paternal Uncle        d. 24s   Cancer Maternal Aunt        ovarian or endometrial dx 23s   Pancreatic cancer Paternal Uncle        d. late 63s     Social History   Tobacco Use   Smoking status: Never   Smokeless tobacco: Never  Vaping Use   Vaping Use: Never used  Substance Use Topics   Alcohol use: No   Drug use: No    Medications: I have reviewed the patient's current medications. Allergies as of 09/20/2022       Reactions   Lisinopril Cough        Medication List        Accurate as of September 20, 2022  9:47 AM. If you have any questions, ask your nurse or doctor.          alendronate 70 MG tablet Commonly known as: FOSAMAX TAKE ONE TABLET EVERY 7 DAYS ON AN EMPTY STOMACH WITH A FULL GLASS OF WATER   amLODipine 10 MG tablet Commonly known as: NORVASC Take 1 tablet (10 mg total) by mouth daily. For blood pressure   atorvastatin 20 MG tablet Commonly known as: LIPITOR Take 1 tablet (20 mg total) by mouth daily. For cholesterol   cholecalciferol 25 MCG (1000 UNIT) tablet Commonly known as: VITAMIN D3 Take 1,000 Units by mouth daily.   CRANBERRY FRUIT PO Take 1 tablet by mouth daily.   diclofenac Sodium 1 % Gel Commonly known as: VOLTAREN APPLY 4 GRAMS TO AFFECTED AREA(S) 4 TIMES A DAY   dorzolamide-timolol 2-0.5 % ophthalmic solution Commonly known as: COSOPT INSTILL ONE DROP IN EACH EYE TWICE DAILY   Eliquis 5 MG Tabs tablet Generic drug: apixaban TAKE ONE TABLET TWICE DAILY   FeroSul 325 (65 FE) MG tablet Generic drug: ferrous sulfate TAKE  (1)  TABLET TWICE A DAY WITH MEALS (BREAKFAST AND SUPPER)   hydrALAZINE 25 MG tablet Commonly known as: APRESOLINE Take 1 tablet (25 mg total) by mouth 2 (two) times daily.   losartan-hydrochlorothiazide 100-25 MG tablet Commonly known as: HYZAAR TAKE 1 TABLET DAILY   potassium chloride SA 20 MEQ tablet Commonly known as: KLOR-CON M TAKE ONE TABLET THREE TIMES DAILY   spironolactone 25 MG tablet Commonly known as: ALDACTONE Take 1 tablet (25 mg total) by mouth daily.         ROS:  A comprehensive review of systems was negative  except for: no issue reported, port in place   Blood pressure (!) 167/88, pulse 83, temperature (!) 97.4 F (36.3 C), temperature source Oral, resp. rate 16, height '5\' 2"'$  (1.575 m), weight 171 lb (77.6 kg), SpO2 97 %. Physical Exam Vitals reviewed.  Constitutional:      Appearance: Normal appearance.  HENT:     Head: Normocephalic.     Mouth/Throat:     Mouth: Mucous membranes are moist.  Eyes:  Extraocular Movements: Extraocular movements intact.  Cardiovascular:     Rate and Rhythm: Normal rate.  Pulmonary:     Effort: Pulmonary effort is normal.  Chest:     Comments: Left chest port in place  Abdominal:     General: There is no distension.     Palpations: Abdomen is soft.     Tenderness: There is no abdominal tenderness.  Musculoskeletal:     Cervical back: Normal range of motion.  Skin:    General: Skin is warm.  Neurological:     General: No focal deficit present.     Mental Status: She is alert.  Psychiatric:        Mood and Affect: Mood normal.        Behavior: Behavior normal.     Results: CLINICAL DATA:  Colorectal cancer.  Restaging.   EXAM: CT CHEST, ABDOMEN, AND PELVIS WITH CONTRAST   TECHNIQUE: Multidetector CT imaging of the chest, abdomen and pelvis was performed following the standard protocol during bolus administration of intravenous contrast.   CONTRAST:  144m OMNIPAQUE IOHEXOL 300 MG/ML  SOLN   COMPARISON:  07/30/2021   FINDINGS: CT CHEST FINDINGS   Cardiovascular: The heart size is normal. No substantial pericardial effusion. Moderate atherosclerotic calcification is noted in the wall of the thoracic aorta. Left Port-A-Cath tip is in the left innominate vein just proximal to the innominate vein confluence.   Mediastinum/Nodes: No mediastinal lymphadenopathy. There is no hilar lymphadenopathy. The esophagus has normal imaging features. There is no axillary lymphadenopathy.   Lungs/Pleura: Stable 4 mm right middle lobe nodule  (83/3) no change 3 mm right parahilar nodule on 88/3. Index 6 mm right lower lobe nodule measured previously is 6 mm again today on 79/3. Index left lower lobe nodule measured previously at 5 mm is 6 mm today on 79/3. Additional scattered tiny nodules are stable. No focal airspace consolidation. There is no evidence of pleural effusion.   Musculoskeletal: No worrisome lytic or sclerotic osseous abnormality.   CT ABDOMEN PELVIS FINDINGS   Hepatobiliary: No suspicious focal abnormality within the liver parenchyma. There is no evidence for gallstones, gallbladder wall thickening, or pericholecystic fluid. No intrahepatic or extrahepatic biliary dilation.   Pancreas: No focal mass lesion. No dilatation of the main duct. No intraparenchymal cyst. No peripancreatic edema.   Spleen: Stable hypodensity in the medial spleen, likely cyst or pseudocyst.   Adrenals/Urinary Tract: No adrenal nodule or mass. Tiny hypoattenuating renal lesions are stable in the interval, likely cyst. No evidence for hydroureter. Circumferential bladder wall thickening again noted.   Stomach/Bowel: Stomach is unremarkable. No gastric wall thickening. No evidence of outlet obstruction. Duodenum is normally positioned as is the ligament of Treitz. No small bowel wall thickening. No small bowel dilatation. Right hemicolectomy. No gross colonic mass. No colonic wall thickening.   Vascular/Lymphatic: There is moderate atherosclerotic calcification of the abdominal aorta without aneurysm. There is no gastrohepatic or hepatoduodenal ligament lymphadenopathy. No retroperitoneal or mesenteric lymphadenopathy. Stable small lymph nodes in the hepatoduodenal ligament. No pelvic sidewall lymphadenopathy.   Reproductive: Unremarkable.   Other: No intraperitoneal free fluid. 2 cm focus of soft tissue adjacent to the ileocolic anastomosis (742/3 is stable in the interval.   Musculoskeletal: No worrisome lytic or  sclerotic osseous abnormality.   IMPRESSION: 1. Stable exam. No new or progressive findings to suggest recurrent or metastatic disease in the chest, abdomen, or pelvis. 2. Soft tissue lesion adjacent to the ileocolic anastomosis is unchanged. 3. Scattered tiny bilateral  pulmonary nodules are stable in the interval. Continued attention on follow-up recommended. 4. Left Port-A-Cath tip is in the left innominate vein just proximal to the innominate vein confluence. 5. Circumferential bladder wall thickening again noted. 6. Aortic Atherosclerosis (ICD10-I70.0).     Electronically Signed   By: Misty Stanley M.D.   On: 09/29/2021 12:46  INDICATION: Nonfunctioning Port-A-Cath, per patient has had Port-A-Cath for about a year, unable to obtain blood return   EXAM: CONTRAST INJECTION OF PORT A CATH UNDER FLUOROSCOPY   CONTRAST:  86m OMNIPAQUE IOHEXOL 300 MG/ML  SOLN   TECHNIQUE: Contrast was administered via the indwelling port after it was accessed. Fluoroscopic spot images were obtained of the catheter during injection   ANESTHESIA/SEDATION: None   FLUOROSCOPY TIME:  Radiation Exposure Index (as provided by the fluoroscopic device): 2.5 mGy   If the device does not provide the exposure index:   Fluoroscopy Time:  0 minutes and 30 seconds   Number of Acquired Images:  Insert spots   COMPLICATIONS: None immediate.   TECHNIQUE: Indwelling LEFT subclavian Port-A-Cath was injected with contrast material under fluoroscopy.   FINDINGS: Reservoir adequately accessed by needle.   Tubing and Port-A-Cath are well connected without contrast extravasation.   Catheter tubing opacifies normally.   High-grade resistance to contrast injection, with only a small amount of contrast injecting into the SVC.   A small amount of contrast refluxes into the LEFT brachiocephalic vein.   Findings are consistent with high-grade obstruction of the outflow of the Port-A-Cath tubing  either by thrombus or fibrin.   Unable to obtain blood return.   IMPRESSION: High-grade resistance to outflow from the Port-A-Cath tubing by either thrombus or fibrin.   I was able however to inject some contrast into the SVC, reflux of contrast noted into the LEFT brachiocephalic vein.     Electronically Signed   By: MLavonia DanaM.D.   On: 12/08/2021 14:16   CLINICAL DATA:  76year old female with history of metastatic colon cancer status post partial colectomy undergoing ongoing chemotherapy. Follow-up study. * Tracking Code: BO *   EXAM: CT CHEST, ABDOMEN, AND PELVIS WITH CONTRAST   TECHNIQUE: Multidetector CT imaging of the chest, abdomen and pelvis was performed following the standard protocol during bolus administration of intravenous contrast.   RADIATION DOSE REDUCTION: This exam was performed according to the departmental dose-optimization program which includes automated exposure control, adjustment of the mA and/or kV according to patient size and/or use of iterative reconstruction technique.   CONTRAST:  1070mOMNIPAQUE IOHEXOL 300 MG/ML  SOLN   COMPARISON:  CT of the chest, abdomen and pelvis 02/08/2022.   FINDINGS: CT CHEST FINDINGS   Cardiovascular: Heart size is normal. There is no significant pericardial fluid, thickening or pericardial calcification. There is aortic atherosclerosis, as well as atherosclerosis of the great vessels of the mediastinum and the coronary arteries, including calcified atherosclerotic plaque in the left main, left anterior descending and right coronary arteries. Calcifications of the mitral annulus.   Mediastinum/Nodes: No pathologically enlarged mediastinal or hilar lymph nodes. Esophagus is unremarkable in appearance. No axillary lymphadenopathy.   Lungs/Pleura: A few scattered small pulmonary nodules are noted in the lungs bilaterally, stable in number and size compared to the prior study, with the largest of these  measuring 6 x 4 mm (mean diameter 5 mm) in the left lower lobe (axial image 82 of series 3). No other larger more suspicious appearing pulmonary nodules or masses are noted. No acute consolidative airspace disease.  No pleural effusions. Scattered areas of mild post infectious or inflammatory scarring are noted throughout the lung bases.   Musculoskeletal: There are no aggressive appearing lytic or blastic lesions noted in the visualized portions of the skeleton.   CT ABDOMEN PELVIS FINDINGS   Hepatobiliary: Diffuse low attenuation throughout the hepatic parenchyma, indicative of a background of severe hepatic steatosis. No suspicious cystic or solid hepatic lesions. No intra or extrahepatic biliary ductal dilatation. Gallbladder is normal in appearance.   Pancreas: No pancreatic mass. No pancreatic ductal dilatation. No pancreatic or peripancreatic fluid collections or inflammatory changes.   Spleen: 1.7 cm low-attenuation lesion in the medial aspect of the spleen, stable compared to prior studies, presumably a benign cyst.   Adrenals/Urinary Tract: Subcentimeter low-attenuation lesions in both kidneys, too small to characterize, but similar to prior studies and statistically likely to represent tiny cysts (no imaging follow-up is recommended). No hydroureteronephrosis. Bilateral adrenal glands are normal in appearance. Severe circumferential thickening of the urinary bladder, similar to the prior study.   Stomach/Bowel: The appearance of the stomach is normal. There is no pathologic dilatation of small bowel or colon. Status post right hemicolectomy.   Vascular/Lymphatic: Aortic atherosclerosis, without evidence of aneurysm or dissection in the abdominal or pelvic vasculature. No lymphadenopathy noted in the abdomen or pelvis.   Reproductive: Uterus and ovaries are atrophic.   Other: Soft tissue nodularity adjacent to the ileocolic anastomosis in the right upper quadrant  (axial image 74 of series 2) measuring 1.2 x 1.7 cm, unchanged to slightly decreased compared to prior studies, likely an area of chronic postoperative scarring. No significant volume of ascites. No pneumoperitoneum.   Musculoskeletal: There are no aggressive appearing lytic or blastic lesions noted in the visualized portions of the skeleton.   IMPRESSION: 1. Stable examination demonstrating no findings to suggest recurrent or metastatic disease in the chest, abdomen or pelvis. 2. Previously noted small pulmonary nodules are stable compared to the prior examination, favored to be benign. Continued attention on follow-up studies is recommended. 3. Small amount of soft tissue nodularity near the ileocolic anastomosis, stable to decreased in size compared to prior examinations, presumably mild chronic postoperative scarring. 4. Chronic circumferential bladder wall thickening, stable. This is of uncertain etiology and significance, but urologic consultation should be considered if not already obtained. 5. Aortic atherosclerosis, in addition to left main and 2 vessel coronary artery disease. Assessment for potential risk factor modification, dietary therapy or pharmacologic therapy may be warranted, if clinically indicated. 6. There are calcifications of the mitral annulus. Echocardiographic correlation for evaluation of potential valvular dysfunction may be warranted if clinically indicated. 7. Additional incidental findings, as above.     Electronically Signed   By: Vinnie Langton M.D.   On: 07/28/2022 09:05   Assessment & Plan:  EMMERSYN KRATZKE is a 76 y.o. female with a functioning port in place in the left subclavian that terminates just proximal to the confluence of the innominates AKA the SVC.    This is where the port is presumed to be so that ALL people involved can understand the location. This is NOT in a small branch like the azygos, this is NOT in vein directed to the  cranium. This is just proximal to the SVC.    It is hard to even note the  catheter on her CT scans because of the contrast and in reality the only time I can see the position is on the scout films and on the injection film.  I have no doubt that she could have a fibrin sheath on the catheter and that the alteplase helped in the past. I also can see how this would prevent blood from being drawn back.  This does not make a catheter a "malfunctioning catheter" as she is able have flow and flush through the catheter and she is able to get treatments without any pain or side effects.    The risk of replacement port include risk of sedation/ anesthesia, risk of bleeding, coming off Eliquis for her A fib and risk of stroke, risk of pneumothorax, risk of injury to the new vessels, risk of new port not being able to draw back blood.  A port is functioning if it can receive flow and deliver therapy. Hers is doing this at this time.    I have discussed the position with the patient. I have discussed the concerns with the Oncology Department and Dr. Delton Coombes. Dr. Delton Coombes was not even aware of the referral for port replacement.   I have heard the concerns from the clinical RN supervisor (CNS) at the Oncology Department and their policy.   I was asked to do the following:   (1) patient has been notified that the tip placement is not in the recommended location- The patient is aware it is not in the optimal location but it is not in a dangerous location either (see drawing)   (2) the patient has been educated on the potential risks/complications of a malpositioned catheter tip and the potential risks/complications of port revision/replacement- I have discussed extensively with the patient and the team the risk of replacement. As far as the risk of the catheter currently, a fibrin sheath could develop as it develops with any longterm catheter, and blood draws may not be possible. There is no thrombus or  concerning feature on the most recent CT chest 07/2022. This is not directed to an intracranial position and this is central in location. This catheter is not the same as malpositioned catheters described in an article CNS sent.   (3) the patient's decision to continue with the current port. - I have discussed the use of the catheter with the patient and her friend and Dr. Delton Coombes.  I believe that the risk of replacement outweighs the risk of continue with her therapy through this catheter. If the catheter stops flowing or allowing delivery of treatment or if the catheter starts to cause the patient pain/ discomfort we should replace it then.  The patient and Dr. Delton Coombes are in agreement.   All questions were answered to the satisfaction of the patient and family.     Virl Cagey 09/20/2022, 9:47 AM

## 2022-09-20 NOTE — Patient Instructions (Signed)
Ok to use your port. If you have any pain or issues with the medication flowing then we can replace it. This is not without risk and I think for now the risk of replacing it outweighs any risk of using it.

## 2022-09-27 ENCOUNTER — Ambulatory Visit: Payer: Medicare HMO | Admitting: *Deleted

## 2022-09-27 VITALS — BP 118/67 | HR 82

## 2022-09-27 DIAGNOSIS — I10 Essential (primary) hypertension: Secondary | ICD-10-CM

## 2022-09-27 NOTE — Progress Notes (Signed)
Patient came in today for a blood pressure check. Patients BP 118/67   Pulse 82   Patient advised that we would send over to Dr. Lajuana Ripple to review and call her if any changes needed to be made.

## 2022-10-05 ENCOUNTER — Inpatient Hospital Stay: Payer: Medicare HMO

## 2022-10-05 ENCOUNTER — Inpatient Hospital Stay: Payer: Medicare HMO | Attending: Hematology

## 2022-10-05 VITALS — BP 163/63 | HR 73 | Temp 97.0°F | Resp 18 | Wt 170.0 lb

## 2022-10-05 DIAGNOSIS — C189 Malignant neoplasm of colon, unspecified: Secondary | ICD-10-CM

## 2022-10-05 DIAGNOSIS — C182 Malignant neoplasm of ascending colon: Secondary | ICD-10-CM | POA: Diagnosis not present

## 2022-10-05 DIAGNOSIS — Z79899 Other long term (current) drug therapy: Secondary | ICD-10-CM | POA: Diagnosis not present

## 2022-10-05 DIAGNOSIS — Z5112 Encounter for antineoplastic immunotherapy: Secondary | ICD-10-CM | POA: Diagnosis not present

## 2022-10-05 LAB — CBC WITH DIFFERENTIAL/PLATELET
Abs Immature Granulocytes: 0.01 10*3/uL (ref 0.00–0.07)
Basophils Absolute: 0 10*3/uL (ref 0.0–0.1)
Basophils Relative: 1 %
Eosinophils Absolute: 0.2 10*3/uL (ref 0.0–0.5)
Eosinophils Relative: 4 %
HCT: 39.4 % (ref 36.0–46.0)
Hemoglobin: 13.1 g/dL (ref 12.0–15.0)
Immature Granulocytes: 0 %
Lymphocytes Relative: 23 %
Lymphs Abs: 1.2 10*3/uL (ref 0.7–4.0)
MCH: 31.7 pg (ref 26.0–34.0)
MCHC: 33.2 g/dL (ref 30.0–36.0)
MCV: 95.4 fL (ref 80.0–100.0)
Monocytes Absolute: 0.4 10*3/uL (ref 0.1–1.0)
Monocytes Relative: 8 %
Neutro Abs: 3.4 10*3/uL (ref 1.7–7.7)
Neutrophils Relative %: 64 %
Platelets: 144 10*3/uL — ABNORMAL LOW (ref 150–400)
RBC: 4.13 MIL/uL (ref 3.87–5.11)
RDW: 12.8 % (ref 11.5–15.5)
WBC: 5.3 10*3/uL (ref 4.0–10.5)
nRBC: 0 % (ref 0.0–0.2)

## 2022-10-05 LAB — COMPREHENSIVE METABOLIC PANEL
ALT: 27 U/L (ref 0–44)
AST: 26 U/L (ref 15–41)
Albumin: 4.4 g/dL (ref 3.5–5.0)
Alkaline Phosphatase: 74 U/L (ref 38–126)
Anion gap: 8 (ref 5–15)
BUN: 22 mg/dL (ref 8–23)
CO2: 25 mmol/L (ref 22–32)
Calcium: 9.8 mg/dL (ref 8.9–10.3)
Chloride: 103 mmol/L (ref 98–111)
Creatinine, Ser: 1.51 mg/dL — ABNORMAL HIGH (ref 0.44–1.00)
GFR, Estimated: 36 mL/min — ABNORMAL LOW (ref >60)
Glucose, Bld: 112 mg/dL — ABNORMAL HIGH (ref 70–99)
Potassium: 4.3 mmol/L (ref 3.5–5.1)
Sodium: 136 mmol/L (ref 135–145)
Total Bilirubin: 0.7 mg/dL (ref 0.3–1.2)
Total Protein: 8.3 g/dL — ABNORMAL HIGH (ref 6.5–8.1)

## 2022-10-05 LAB — TSH: TSH: 2.051 u[IU]/mL (ref 0.350–4.500)

## 2022-10-05 MED ORDER — HEPARIN SOD (PORK) LOCK FLUSH 100 UNIT/ML IV SOLN
500.0000 [IU] | Freq: Once | INTRAVENOUS | Status: AC | PRN
  Administered 2022-10-05: 500 [IU]

## 2022-10-05 MED ORDER — SODIUM CHLORIDE 0.9% FLUSH
10.0000 mL | INTRAVENOUS | Status: DC | PRN
  Administered 2022-10-05: 10 mL

## 2022-10-05 MED ORDER — SODIUM CHLORIDE 0.9 % IV SOLN: Freq: Once | INTRAVENOUS | Status: AC

## 2022-10-05 MED ORDER — SODIUM CHLORIDE 0.9 % IV SOLN: INTRAVENOUS | Status: DC

## 2022-10-05 MED ORDER — SODIUM CHLORIDE 0.9 % IV SOLN
200.0000 mg | Freq: Once | INTRAVENOUS | Status: AC
  Administered 2022-10-05: 200 mg via INTRAVENOUS
  Filled 2022-10-05: qty 8

## 2022-10-05 NOTE — Progress Notes (Signed)
Patient presents to clinic today for treatment. Per Dr. Delton Coombes and Dr. Constance Haw it is ok to use her port a cath that does not give blood return.   Ok to give [facility] today per Dr. Delton Coombes. Creatinine was 1.51, per MD , give 500 ml NS bolus per MD.   Treatment given per orders. Patient tolerated it well without problems. Vitals stable and discharged home from clinic ambulatory. Follow up as scheduled.

## 2022-10-06 LAB — T4: T4, Total: 8.2 ug/dL (ref 4.5–12.0)

## 2022-10-14 ENCOUNTER — Encounter (INDEPENDENT_AMBULATORY_CARE_PROVIDER_SITE_OTHER): Payer: Self-pay | Admitting: Ophthalmology

## 2022-10-14 ENCOUNTER — Ambulatory Visit (INDEPENDENT_AMBULATORY_CARE_PROVIDER_SITE_OTHER): Payer: Medicare HMO | Admitting: Ophthalmology

## 2022-10-14 DIAGNOSIS — H353231 Exudative age-related macular degeneration, bilateral, with active choroidal neovascularization: Secondary | ICD-10-CM

## 2022-10-14 DIAGNOSIS — H43813 Vitreous degeneration, bilateral: Secondary | ICD-10-CM

## 2022-10-14 DIAGNOSIS — H04123 Dry eye syndrome of bilateral lacrimal glands: Secondary | ICD-10-CM

## 2022-10-14 DIAGNOSIS — Z961 Presence of intraocular lens: Secondary | ICD-10-CM | POA: Diagnosis not present

## 2022-10-14 DIAGNOSIS — H40053 Ocular hypertension, bilateral: Secondary | ICD-10-CM

## 2022-10-14 DIAGNOSIS — I10 Essential (primary) hypertension: Secondary | ICD-10-CM

## 2022-10-14 DIAGNOSIS — H35033 Hypertensive retinopathy, bilateral: Secondary | ICD-10-CM | POA: Diagnosis not present

## 2022-10-14 MED ORDER — DORZOLAMIDE HCL-TIMOLOL MAL 2-0.5 % OP SOLN: OPHTHALMIC | 10 refills | Status: DC

## 2022-10-14 MED ORDER — AFLIBERCEPT 2MG/0.05ML IZ SOLN FOR KALEIDOSCOPE
2.0000 mg | INTRAVITREAL | Status: AC | PRN
  Administered 2022-10-14: 2 mg via INTRAVITREAL

## 2022-10-14 NOTE — Progress Notes (Addendum)
[facility] Note  10/14/2022    CHIEF COMPLAINT Patient presents for Retina Follow Up  HISTORY OF PRESENT ILLNESS: Gabrielle [name] is a 76 y.o. female who presents to the clinic today for:   HPI     Retina Follow Up   Patient presents with  Wet AMD.  In both eyes.  This started months ago.  Severity is moderate.  Duration of 5 weeks.  Since onset it is stable.  I, the attending physician,  performed the HPI with the patient and updated documentation appropriately.        Comments   Patient feels that the vision is the same since her last visit. She is using Cosopt OU BID. - She needs refills.       Last edited by Bernarda Caffey, MD on 10/14/2022 12:44 PM.    Pt states her blood pressure is now under control, no change in vision  Referring physician: Janora Norlander, DO [place],  [place] [address]  HISTORICAL INFORMATION:   Selected notes from the MEDICAL RECORD NUMBER Referred by Dr. Cristela Blue for concern of ARMD OU   CURRENT MEDICATIONS: Current Outpatient Medications (Ophthalmic Drugs)  Medication Sig   dorzolamide-timolol (COSOPT) 2-0.5 % ophthalmic solution INSTILL ONE DROP IN [facility] EYE TWICE DAILY   No current facility-administered medications for this visit. (Ophthalmic Drugs)   Current Outpatient Medications (Other)  Medication Sig   alendronate (FOSAMAX) 70 MG tablet TAKE ONE TABLET EVERY 7 DAYS ON AN EMPTY STOMACH WITH A FULL GLASS OF WATER   amLODipine (NORVASC) 10 MG tablet Take 1 tablet (10 mg total) by mouth daily. For blood pressure   atorvastatin (LIPITOR) 20 MG tablet Take 1 tablet (20 mg total) by mouth daily. For cholesterol   cholecalciferol (VITAMIN D3) 25 MCG (1000 UT) tablet Take 1,000 Units by mouth daily.   CRANBERRY FRUIT PO Take 1 tablet by mouth daily.   diclofenac Sodium (VOLTAREN) 1 % GEL APPLY 4 GRAMS TO AFFECTED AREA(S) 4 TIMES A DAY   ELIQUIS 5 MG TABS tablet TAKE ONE TABLET TWICE DAILY    FEROSUL 325 (65 Fe) MG tablet TAKE  (1)  TABLET TWICE A DAY WITH MEALS (BREAKFAST AND SUPPER)   hydrALAZINE (APRESOLINE) 25 MG tablet Take 1 tablet (25 mg total) by mouth 2 (two) times daily.   losartan-hydrochlorothiazide (HYZAAR) 100-25 MG tablet TAKE 1 TABLET DAILY   potassium chloride SA (KLOR-CON M) 20 MEQ tablet TAKE ONE TABLET THREE TIMES DAILY   spironolactone (ALDACTONE) 25 MG tablet Take 1 tablet (25 mg total) by mouth daily.   No current facility-administered medications for this visit. (Other)   REVIEW OF SYSTEMS: ROS   Positive for: Gastrointestinal, Genitourinary, Musculoskeletal, Cardiovascular, Eyes Negative for: Constitutional, Neurological, Skin, HENT, Endocrine, Respiratory, Psychiatric, Allergic/Imm, Heme/Lymph Last edited by Annie Paras, COT on 10/14/2022  8:18 AM.     ALLERGIES Allergies  Allergen Reactions   Lisinopril Cough   PAST MEDICAL HISTORY Past Medical History:  Diagnosis Date   Arthritis    Cancer [facility])    Cataract    OU   Colon cancer ([place])    Family history of pancreatic cancer 08/21/2020   Family history of uterine cancer 08/21/2020   H/O cesarean section    Hx of tonsillectomy    Hyperlipidemia    Hypertension    Macular degeneration    Exu ARMD OU   Paroxysmal atrial fibrillation ([place]) 07/05/2020   Past  Surgical History:  Procedure Laterality Date   BIOPSY  04/06/2020   Procedure: BIOPSY;  Surgeon: Rogene Houston, MD;  Location: [facility] END[facility] SU[facility];  Service: Endoscopy;;   CARPAL TUNNEL RELEASE Right    CATARACT EXTRACTION W/PHACO Left 10/11/2019   Procedure: CATARACT EXTRACTION PHACO AND INTRAOCULAR LENS PLACEMENT ([place]);  Surgeon: Baruch Goldmann, MD;  Location: [facility] ORS;  Service: Ophthalmology;  Laterality: Left;  CDE: 8.56   CATARACT EXTRACTION W/PHACO Right 10/25/2019   Procedure: CATARACT EXTRACTION PHACO AND INTRAOCULAR LENS PLACEMENT (IOC);  Surgeon: Baruch Goldmann, MD;  Location: [facility] ORS;  Service: Ophthalmology;  Laterality:  Right;  CDE: 5.67   CESAREAN SECTION     COLONOSCOPY N/A 04/06/2020   Procedure: COLONOSCOPY;  Surgeon: Rogene Houston, MD;  Location: [facility] ENDO SUITE;  Service: Endoscopy;  Laterality: N/A;   CYSTOSCOPY WITH BIOPSY N/A 04/08/2020   Procedure: CYSTOSCOPY WITH BIOPSY;  Surgeon: Cleon Gustin, MD;  Location: [facility] ORS;  Service: Urology;  Laterality: N/A;   ESOPHAGOGASTRODUODENOSCOPY N/A 04/05/2020   Procedure: ESOPHAGOGASTRODUODENOSCOPY (EGD);  Surgeon: Rogene Houston, MD;  Location: [facility];  Service: Endoscopy;  Laterality: N/A;   PARTIAL COLECTOMY N/A 04/08/2020   Procedure: PARTIAL COLECTOMY;  Surgeon: Virl Cagey, MD;  Location: [facility] ORS;  Service: General;  Laterality: N/A;   PORTACATH PLACEMENT Left 05/18/2020   Procedure: INSERTION PORT-A-CATH (ATTACHED CATHETER IN LEFT SUBCLAVIAN);  Surgeon: Virl Cagey, MD;  Location: [facility] ORS;  Service: General;  Laterality: Left;   TONSILLECTOMY     FAMILY HISTORY Family History  Problem Relation Age of Onset   Macular degeneration Mother    Stroke Mother    Hypertension Mother    Dementia Mother    Lung cancer Father        dx late 50s; smoking hx   Diabetes Sister    Hypertension Sister    Leukemia Paternal Uncle        d. 19s   Cancer Maternal Aunt        ovarian or endometrial dx 77s   Pancreatic cancer Paternal Uncle        d. late 81s   SOCIAL HISTORY Social History   Tobacco Use   Smoking status: Never   Smokeless tobacco: Never  Vaping Use   Vaping Use: Never used  Substance Use Topics   Alcohol use: No   Drug use: No       OPHTHALMIC EXAM: Base Eye Exam     Visual Acuity (Snellen - Linear)       Right Left   Dist cc CF at 3' 20/70   Dist ph cc NI NI    Correction: Glasses         Tonometry (Tonopen, 8:21 AM)       Right Left   Pressure 27 20         Pupils       Dark Light Shape React APD   Right 3 2 Round Brisk None   Left 3 2 Round Brisk None         Visual Fields        Left Right    Full Full         Extraocular Movement       Right Left    Full, Ortho Full, Ortho         Neuro/Psych     Oriented x3: Yes   Mood/Affect: Normal         Dilation  Both eyes: 1.0% Mydriacyl, 2.5% Phenylephrine @ 8:19 AM           Slit Lamp and Fundus Exam     Slit Lamp Exam       Right Left   Lids/Lashes Dermatochalasis - upper lid Dermatochalasis - upper lid   Conjunctiva/Sclera 1+ Injection Trace Injection   Cornea Arcus, 3+ fine Punctpate epithelial erosions Arcus, 3+ fine Punctate epithelial erosions, mild tear film debris   Anterior Chamber Deep and quiet Deep and quiet   Iris Round and well dilated Round and well dilated   Lens PCIOL in good position, 1+ Posterior capsular opacification PC IOL in good position with open PC   Anterior Vitreous Vitreous syneresis, Posterior vitreous detachment, vitreous condensations Vitreous syneresis, Posterior vitreous detachment, silicone oil micro drops         Fundus Exam       Right Left   Disc pink and sharp, compact mild pallor, sharp, Compact, vascular loops superiorly   C/D Ratio 0.2 0.2   Macula Blunted foveal reflex, Drusen, Pigment clumping and atrophy, central thickening/pigmented disciform scar, +PED, punctate IRH superior macula within atrophy, persistent, trace cystic changes overlying central scar - stable Blunted foveal reflex, central thickening, RPE clumping and atrophy, Drusen, trace cystic changes overlying PED - persistent / slightly improved, no heme   Vessels Vascular attenuation, Tortuous Vascular attenuation, Tortuous   Periphery Attached, mild Reticular degeneration Attached, mild Reticular degeneration           Refraction     Wearing Rx       Sphere Cylinder Add   Right [place] Sphere +3.50   Left -0.50 Sphere +3.50    Type: PAL           IMAGING AND PROCEDURES  Imaging and Procedures for 03/21/18  OCT, Retina - OU - Both Eyes       Right Eye Quality  was good. Central Foveal Thickness: 453. Progression has been stable. Findings include no SRF, abnormal foveal contour, retinal drusen , outer retinal tubulation, subretinal hyper-reflective material, disciform scar, epiretinal membrane, intraretinal fluid, pigment epithelial detachment, outer retinal atrophy (Stable sub-retinal scar, mild persistent cystic changes overlying -- slightly improved).   Left Eye Quality was good. Central Foveal Thickness: 303. Progression has worsened. Findings include no SRF, abnormal foveal contour, retinal drusen , subretinal hyper-reflective material, intraretinal fluid, pigment epithelial detachment, outer retinal atrophy (Persistent cystic changes overlying PED/SRHM -- slightly increased).   Notes *Images captured and stored on drive  Diagnosis / Impression:  Exudative ARMD OU OD: Stable sub-retinal scar, mild persistent cystic changes overlying -- slightly improved OS: Persistent cystic changes overlying PED/SRHM -- slightly increased  Clinical management:  See below  Abbreviations: NFP - Normal foveal profile. CME - cystoid macular edema. PED - pigment epithelial detachment. IRF - intraretinal fluid. SRF - subretinal fluid. EZ - ellipsoid zone. ERM - epiretinal membrane. ORA - outer retinal atrophy. ORT - outer retinal tubulation. SRHM - subretinal hyper-reflective material      Intravitreal Injection, Pharmacologic Agent - OS - Left Eye       Time Out 10/14/2022. 9:29 AM. Confirmed correct patient, procedure, site, and patient consented.   Anesthesia Topical anesthesia was used. Anesthetic medications included Lidocaine 2%, Proparacaine 0.5%.   Procedure Preparation included 5% betadine to ocular surface, eyelid speculum. A (32g) needle was used.   Injection: 2 mg aflibercept 2 MG/0.05ML   Route: Intravitreal, Site: Left Eye   NDC: [id number], Lot: [id number], Expiration date: 12/06/2023, Waste:  0 mL   Post-op Post injection exam found  visual acuity of at least counting fingers. The patient tolerated the procedure well. There were no complications. The patient received written and verbal post procedure care education. Post injection medications were not given.            ASSESSMENT/PLAN:    ICD-10-CM   1. Exudative age-related macular degeneration of both eyes with active choroidal neovascularization (HCC)  H35.3231 OCT, Retina - OU - Both Eyes    Intravitreal Injection, Pharmacologic Agent - OS - Left Eye    aflibercept (EYLEA) SOLN 2 mg    2. Posterior vitreous detachment of both eyes  H43.813     3. Essential hypertension  I10     4. Hypertensive retinopathy of both eyes  H35.033     5. Pseudophakia of both eyes  Z96.1     6. Ocular hypertension, bilateral  H40.053     7. Dry eyes  H04.123      1. Exudative age-related macular degeneration, both eyes  - severe exudative disease with very active CNVM OU at presentation in January 2019  - S/P IVA OD #1 (01.04.19), #2 (02.15.19)  - S/P IVA OS #1 (01.18.19), #2 (02.15.19)  - switched to Eylea 3.18.19 due to severity of disease  - S/P IVE OD #1 (03.18.19), #2 (04.17.19), #3 (05.15.19), #4 (10.02.19),  #5 (01.15.20), #6 (06.04.20), #7 (10.29.20), #8 (01.21.21), #9 (05.27.21), #10 (07.22.21) -- injections held due to stable disciform scar  - S/P IVE OS #1 (03.18.19), #2 (04.17.19), #3 (05.15.19), #4 (06.12.19), #5 (07.10.19), #6 (08.07.19), #7 (09.04.19), #8 (10.02.19), #9 (11.06.19), #10 (12.11.19), #11 (01.15.20), #12 (02.20.20), #13 (03.26.20), #14 (04.30.20), #15 (06.04.20), #16 (07.16.20), # 17 (08.20.20), #18 (09.24.20), #19 (10.29.20), #20 (12.03.20), #21 (01.21.21), #22 (03.18.21), #23 (05.27.21), #24 (07.22.21), #25 (11.11.21), #26 (12.09.21), #27 (01.06.22), #28 (2.7.22), #29 (03.24.22), #30 (4.28.22), #31 (05.31.22), #32 (06.30.22), #33 (08.04.22), #34 (09.15.22), #35 (10.27.22), #36 (12.08.22), #37 (01.12.23), #38 (02.16.23), #39 (03.30.23), #40 (05.11.23),  #41 (06.22.23), #42 (07.27.23), #43 (09.01.23), #44 (10.06.23)  - IVE OS held from 7.22.21 to 11.11.21 due to TIA/stroke on 8.1.21  - OCT OD: Stable sub-retinal scar, mild persistent cystic changes overlying -- slightly improved; OS: Persistent cystic changes overlying PED/SRHM -- slightly increased  - exam OS shows faint IRH nasal fovea stably resolved, no heme; OD with focal IRH superior macula within atrophy  - BCVA OD stable at CF 3'; OS 20/70 -- stable  - recommend IVE OS #45 today, 11.10.23 w/ f/u 5 wks - will cont to hold OD  - Eylea informed consent form re-signed and scanned on 07.27.2023  - Good Days approved 12/05/2020 - 12/04/2022  - f/u in 5 weeks, sooner prn for DFE/OCT/possible injection(s)   2. PVD / vitreous syneresis  - Discussed findings and prognosis   - No RT or RD on 360 exam  - Reviewed s/s of RT/RD  - Strict return precautions for any such RT/RD signs/symptoms   3,4. Hypertensive retinopathy OU - pt reports spike in blood pressure at last [place] visit and 2 ED visits w/ significantly elevated BP and headache - BP 186/96 today in office (10.6.23) - exam shows persistent focal IRH OD within GA - discussed importance of tight BP control - monitor  5. Pseudophakia OU  - s/p CE/IOL (Dr. Marisa Hua, 11.2020)  - beautiful surgeries w/ IOLs in excellent position, doing well  - monitor   6. Ocular Hypertension OU  - IOP 27,20 OU  -  cont Cosopt BID OU  7. Dry eyes OU - recommend artificial tears and lubricating ointment as needed  Ophthalmic Meds Ordered this visit:  Meds ordered this encounter  Medications   dorzolamide-timolol (COSOPT) 2-0.5 % ophthalmic solution    Sig: INSTILL ONE DROP IN EACH EYE TWICE DAILY    Dispense:  10 mL    Refill:  10   aflibercept (EYLEA) SOLN 2 mg     Return in about 5 weeks (around 11/18/2022) for f/u exu ARMD OU, DFE, OCT.  There are no Patient Instructions on file for this visit.  This document serves as a record of  services personally performed by Gardiner Sleeper, MD, PhD. It was created on their behalf by San Jetty. Owens Shark, OA an ophthalmic technician. The creation of this record is the provider's dictation and/or activities during the visit.    Electronically signed by: San Jetty. Owens Shark, [place] 11.10.2023 12:44 PM   Gardiner Sleeper, M.D., Ph.D. Diseases & Surgery of the Retina and Vitreous [facility]  I have reviewed the above documentation for accuracy and completeness, and I agree with the above. Gardiner Sleeper, M.D., Ph.D. 10/14/22 12:45 PM   Abbreviations: M myopia (nearsighted); A astigmatism; H hyperopia (farsighted); P presbyopia; Mrx spectacle prescription;  CTL contact lenses; OD right eye; OS left eye; OU both eyes  XT exotropia; ET esotropia; PEK punctate epithelial keratitis; PEE punctate epithelial erosions; DES dry eye syndrome; MGD meibomian gland dysfunction; ATs artificial tears; PFAT's preservative free artificial tears; [place] nuclear sclerotic cataract; PSC posterior subcapsular cataract; ERM epi-retinal membrane; PVD posterior vitreous detachment; RD retinal detachment; DM diabetes mellitus; DR diabetic retinopathy; NPDR non-proliferative diabetic retinopathy; PDR proliferative diabetic retinopathy; CSME clinically significant macular edema; DME diabetic macular edema; dbh dot blot hemorrhages; CWS cotton wool spot; POAG primary open angle glaucoma; C/D cup-to-disc ratio; HVF humphrey visual field; GVF goldmann visual field; OCT optical coherence tomography; IOP intraocular pressure; BRVO Branch retinal vein occlusion; CRVO central retinal vein occlusion; CRAO central retinal artery occlusion; BRAO branch retinal artery occlusion; RT retinal tear; SB scleral buckle; PPV pars plana vitrectomy; VH Vitreous hemorrhage; PRP panretinal laser photocoagulation; IVK intravitreal kenalog; VMT vitreomacular traction; MH Macular hole;  NVD neovascularization of the disc; NVE  neovascularization elsewhere; AREDS age related eye disease study; ARMD age related macular degeneration; POAG primary open angle glaucoma; EBMD epithelial/anterior basement membrane dystrophy; ACIOL anterior chamber intraocular lens; IOL intraocular lens; PCIOL posterior chamber intraocular lens; Phaco/IOL phacoemulsification with intraocular lens placement; [place] photorefractive keratectomy; LASIK laser assisted in situ keratomileusis; HTN hypertension; DM diabetes mellitus; COPD chronic obstructive pulmonary disease

## 2022-10-19 ENCOUNTER — Other Ambulatory Visit: Payer: Self-pay | Admitting: *Deleted

## 2022-10-19 MED ORDER — HYDRALAZINE HCL 25 MG PO TABS: 25.0000 mg | ORAL_TABLET | Freq: Two times a day (BID) | ORAL | 3 refills | Status: DC

## 2022-10-19 MED ORDER — SPIRONOLACTONE 25 MG PO TABS: 25.0000 mg | ORAL_TABLET | Freq: Every day | ORAL | 3 refills | Status: DC

## 2022-10-25 ENCOUNTER — Other Ambulatory Visit: Payer: Self-pay

## 2022-10-25 DIAGNOSIS — C189 Malignant neoplasm of colon, unspecified: Secondary | ICD-10-CM

## 2022-10-26 ENCOUNTER — Inpatient Hospital Stay: Payer: Medicare HMO

## 2022-10-26 ENCOUNTER — Inpatient Hospital Stay (HOSPITAL_BASED_OUTPATIENT_CLINIC_OR_DEPARTMENT_OTHER): Payer: Medicare HMO | Admitting: Hematology

## 2022-10-26 DIAGNOSIS — Z79899 Other long term (current) drug therapy: Secondary | ICD-10-CM | POA: Diagnosis not present

## 2022-10-26 DIAGNOSIS — C182 Malignant neoplasm of ascending colon: Secondary | ICD-10-CM | POA: Diagnosis not present

## 2022-10-26 DIAGNOSIS — C189 Malignant neoplasm of colon, unspecified: Secondary | ICD-10-CM | POA: Diagnosis not present

## 2022-10-26 DIAGNOSIS — Z5112 Encounter for antineoplastic immunotherapy: Secondary | ICD-10-CM | POA: Diagnosis not present

## 2022-10-26 LAB — COMPREHENSIVE METABOLIC PANEL
ALT: 25 U/L (ref 0–44)
AST: 27 U/L (ref 15–41)
Albumin: 4.2 g/dL (ref 3.5–5.0)
Alkaline Phosphatase: 73 U/L (ref 38–126)
Anion gap: 7 (ref 5–15)
BUN: 27 mg/dL — ABNORMAL HIGH (ref 8–23)
CO2: 23 mmol/L (ref 22–32)
Calcium: 9.6 mg/dL (ref 8.9–10.3)
Chloride: 105 mmol/L (ref 98–111)
Creatinine, Ser: 1.47 mg/dL — ABNORMAL HIGH (ref 0.44–1.00)
GFR, Estimated: 37 mL/min — ABNORMAL LOW (ref >60)
Glucose, Bld: 121 mg/dL — ABNORMAL HIGH (ref 70–99)
Potassium: 4.5 mmol/L (ref 3.5–5.1)
Sodium: 135 mmol/L (ref 135–145)
Total Bilirubin: 0.5 mg/dL (ref 0.3–1.2)
Total Protein: 7.8 g/dL (ref 6.5–8.1)

## 2022-10-26 LAB — CBC WITH DIFFERENTIAL/PLATELET
Abs Immature Granulocytes: 0.03 10*3/uL (ref 0.00–0.07)
Basophils Absolute: 0 10*3/uL (ref 0.0–0.1)
Basophils Relative: 0 %
Eosinophils Absolute: 0.2 10*3/uL (ref 0.0–0.5)
Eosinophils Relative: 3 %
HCT: 36.2 % (ref 36.0–46.0)
Hemoglobin: 12 g/dL (ref 12.0–15.0)
Immature Granulocytes: 1 %
Lymphocytes Relative: 20 %
Lymphs Abs: 1.1 10*3/uL (ref 0.7–4.0)
MCH: 31.7 pg (ref 26.0–34.0)
MCHC: 33.1 g/dL (ref 30.0–36.0)
MCV: 95.8 fL (ref 80.0–100.0)
Monocytes Absolute: 0.5 10*3/uL (ref 0.1–1.0)
Monocytes Relative: 9 %
Neutro Abs: 3.8 10*3/uL (ref 1.7–7.7)
Neutrophils Relative %: 67 %
Platelets: 145 10*3/uL — ABNORMAL LOW (ref 150–400)
RBC: 3.78 MIL/uL — ABNORMAL LOW (ref 3.87–5.11)
RDW: 13.1 % (ref 11.5–15.5)
WBC: 5.6 10*3/uL (ref 4.0–10.5)
nRBC: 0 % (ref 0.0–0.2)

## 2022-10-26 LAB — TSH: TSH: 2.292 u[IU]/mL (ref 0.350–4.500)

## 2022-10-26 MED ORDER — SODIUM CHLORIDE 0.9 % IV SOLN
200.0000 mg | Freq: Once | INTRAVENOUS | Status: AC
  Administered 2022-10-26: 200 mg via INTRAVENOUS
  Filled 2022-10-26: qty 8

## 2022-10-26 MED ORDER — SODIUM CHLORIDE 0.9% FLUSH: 10.0000 mL | INTRAVENOUS | Status: DC | PRN

## 2022-10-26 MED ORDER — HEPARIN SOD (PORK) LOCK FLUSH 100 UNIT/ML IV SOLN
500.0000 [IU] | Freq: Once | INTRAVENOUS | Status: AC | PRN
  Administered 2022-10-26: 500 [IU]

## 2022-10-26 MED ORDER — SODIUM CHLORIDE 0.9 % IV SOLN: Freq: Once | INTRAVENOUS | Status: AC

## 2022-10-26 NOTE — Progress Notes (Signed)
Virtual Visit via Video Note  I connected with Gabrielle [name] on 10/26/22 at 10:45 AM EST by a video enabled telemedicine application and verified that I am speaking with the correct person using two identifiers.  Location: Patient: In the office Provider: In office   I discussed the limitations of evaluation and management by telemedicine and the availability of in person appointments. The patient expressed understanding and agreed to proceed.  History of Present Illness: She is followed in our office for her metastatic colon cancer.  She is currently receiving pembrolizumab.  This was started in 11/10/2020.   Observations/Objective: She reports energy levels of 90%.  Dry cough is stable.  Denies any immunotherapy related side effects including diarrhea, skin rashes, dyspnea at rest or exertion.  Assessment and Plan:  1.  Stage IV right colon adenocarcinoma, MSI high: - Last CT CAP on 07/26/2022 showed stable disease. - Reviewed labs which showed normal CBC.  LFTs are normal.  TSH is 2.2.  CEA is 2.6. - Proceed with Keytruda every 3 weeks.  RTC 3 months with repeat CT CAP.  2.  Hypokalemia: - Continue potassium 20 mEq 3 times daily.  Potassium normal today.  3.  Hypertension: - Continue Hyzaar and amlodipine.  Blood pressure is 136/78.  4.  Paroxysmal A-fib: - Continue Eliquis indefinitely.   Follow Up Instructions:    I discussed the assessment and treatment plan with the patient. The patient was provided an opportunity to ask questions and all were answered. The patient agreed with the plan and demonstrated an understanding of the instructions.   The patient was advised to call back or seek an in-person evaluation if the symptoms worsen or if the condition fails to improve as anticipated.  I provided 30 minutes of non-face-to-face time during this encounter.   Sreedhar Katragadda, MD   

## 2022-10-26 NOTE — Progress Notes (Signed)
Patient has been examined by Dr. Katragadda, and vital signs and labs have been reviewed. ANC, Creatinine, LFTs, hemoglobin, and platelets are within treatment parameters per M.D. - pt may proceed with treatment.  Primary RN and pharmacy notified.  

## 2022-10-26 NOTE — Patient Instructions (Signed)
[place] at [facility] Discharge Instructions   You were seen and examined today by Dr. Delton Coombes.  He reviewed the results of your lab work which are normal/stable.   We will proceed with your treatment today.  We will repeat a CT scan prior to your next office visit in 12 weeks.  Return as scheduled.     Thank you for choosing [place] at [facility] to provide your oncology and hematology care.  To afford each patient quality time with our provider, please arrive at least 15 minutes before your scheduled appointment time.   If you have a lab appointment with the [facility] please come in thru the Main Entrance and check in at the main information desk.  You need to re-schedule your appointment should you arrive 10 or more minutes late.  We strive to give you quality time with our providers, and arriving late affects you and other patients whose appointments are after yours.  Also, if you no show three or more times for appointments you may be dismissed from the clinic at the providers discretion.     Again, thank you for choosing [facility].  Our hope is that these requests will decrease the amount of time that you wait before being seen by our physicians.       _____________________________________________________________  Should you have questions after your visit to [facility], please contact our office at [phone] and follow the prompts.  Our office hours are 8:00 a.m. and 4:30 p.m. Monday - Friday.  Please note that voicemails left after 4:00 p.m. may not be returned until the following business day.  We are closed weekends and major holidays.  You do have access to a nurse 24-7, just call the main number to the clinic [phone] and do not press any options, hold on the line and a nurse will answer the phone.    For prescription refill requests, have your pharmacy contact our office and allow  72 hours.    Due to Covid, you will need to wear a mask upon entering the hospital. If you do not have a mask, a mask will be given to you at the Main Entrance upon arrival. For doctor visits, patients may have 1 support person age 57 or older with them. For treatment visits, patients can not have anyone with them due to social distancing guidelines and our immunocompromised population.

## 2022-10-26 NOTE — Progress Notes (Signed)
Patient presents today for [facility] and follow up appointment with Dr. Delton Coombes. Labs within parameters for treatment. MAR reviewed. Message received from A. Ouida Sills RN / Dr. Delton Coombes to proceed with treatment.   Patient tolerated treatment without incidence.  She was discharged ambulatory and in stable condition from clinic.  She is aware of her next appointment date and time.

## 2022-10-28 LAB — T4: T4, Total: 7.3 ug/dL (ref 4.5–12.0)

## 2022-10-28 LAB — CEA: CEA: 2.6 ng/mL (ref 0.0–4.7)

## 2022-11-10 ENCOUNTER — Encounter: Payer: Self-pay | Admitting: Hematology

## 2022-11-16 ENCOUNTER — Ambulatory Visit: Payer: Medicare HMO | Admitting: Hematology

## 2022-11-16 ENCOUNTER — Inpatient Hospital Stay: Payer: Medicare HMO

## 2022-11-16 ENCOUNTER — Inpatient Hospital Stay: Payer: Medicare HMO | Attending: Hematology

## 2022-11-16 VITALS — BP 118/63 | HR 77 | Temp 97.8°F | Resp 18 | Wt 171.8 lb

## 2022-11-16 DIAGNOSIS — Z5112 Encounter for antineoplastic immunotherapy: Secondary | ICD-10-CM | POA: Insufficient documentation

## 2022-11-16 DIAGNOSIS — C182 Malignant neoplasm of ascending colon: Secondary | ICD-10-CM | POA: Diagnosis not present

## 2022-11-16 DIAGNOSIS — C189 Malignant neoplasm of colon, unspecified: Secondary | ICD-10-CM

## 2022-11-16 LAB — COMPREHENSIVE METABOLIC PANEL
ALT: 20 U/L (ref 0–44)
AST: 22 U/L (ref 15–41)
Albumin: 4.5 g/dL (ref 3.5–5.0)
Alkaline Phosphatase: 62 U/L (ref 38–126)
Anion gap: 12 (ref 5–15)
BUN: 30 mg/dL — ABNORMAL HIGH (ref 8–23)
CO2: 24 mmol/L (ref 22–32)
Calcium: 10.3 mg/dL (ref 8.9–10.3)
Chloride: 103 mmol/L (ref 98–111)
Creatinine, Ser: 1.46 mg/dL — ABNORMAL HIGH (ref 0.44–1.00)
GFR, Estimated: 37 mL/min — ABNORMAL LOW (ref >60)
Glucose, Bld: 91 mg/dL (ref 70–99)
Potassium: 4.5 mmol/L (ref 3.5–5.1)
Sodium: 139 mmol/L (ref 135–145)
Total Bilirubin: 0.7 mg/dL (ref 0.3–1.2)
Total Protein: 8 g/dL (ref 6.5–8.1)

## 2022-11-16 LAB — CBC WITH DIFFERENTIAL/PLATELET
Abs Immature Granulocytes: 0.02 10*3/uL (ref 0.00–0.07)
Basophils Absolute: 0 10*3/uL (ref 0.0–0.1)
Basophils Relative: 0 %
Eosinophils Absolute: 0.2 10*3/uL (ref 0.0–0.5)
Eosinophils Relative: 4 %
HCT: 37.4 % (ref 36.0–46.0)
Hemoglobin: 12.3 g/dL (ref 12.0–15.0)
Immature Granulocytes: 0 %
Lymphocytes Relative: 21 %
Lymphs Abs: 1.2 10*3/uL (ref 0.7–4.0)
MCH: 31.7 pg (ref 26.0–34.0)
MCHC: 32.9 g/dL (ref 30.0–36.0)
MCV: 96.4 fL (ref 80.0–100.0)
Monocytes Absolute: 0.6 10*3/uL (ref 0.1–1.0)
Monocytes Relative: 10 %
Neutro Abs: 3.7 10*3/uL (ref 1.7–7.7)
Neutrophils Relative %: 65 %
Platelets: 148 10*3/uL — ABNORMAL LOW (ref 150–400)
RBC: 3.88 MIL/uL (ref 3.87–5.11)
RDW: 13.2 % (ref 11.5–15.5)
WBC: 5.8 10*3/uL (ref 4.0–10.5)
nRBC: 0 % (ref 0.0–0.2)

## 2022-11-16 MED ORDER — SODIUM CHLORIDE 0.9 % IV SOLN: Freq: Once | INTRAVENOUS | Status: AC

## 2022-11-16 MED ORDER — SODIUM CHLORIDE 0.9 % IV SOLN
200.0000 mg | Freq: Once | INTRAVENOUS | Status: AC
  Administered 2022-11-16: 200 mg via INTRAVENOUS
  Filled 2022-11-16: qty 8

## 2022-11-16 MED ORDER — SODIUM CHLORIDE 0.9% FLUSH
10.0000 mL | INTRAVENOUS | Status: DC | PRN
  Administered 2022-11-16: 10 mL

## 2022-11-16 MED ORDER — HEPARIN SOD (PORK) LOCK FLUSH 100 UNIT/ML IV SOLN
500.0000 [IU] | Freq: Once | INTRAVENOUS | Status: AC | PRN
  Administered 2022-11-16: 500 [IU]

## 2022-11-16 NOTE — Progress Notes (Signed)
Patient presents today for Keytruda infusion per providers order.  Vital signs and labs within parameters for treatment.  Patient has no new complaints at this time.  Treatment given today per MD orders.  Stable during infusion without adverse affects.  Vital signs stable.  No complaints at this time.  Discharge from clinic ambulatory in stable condition.  Alert and oriented X 3.  Follow up with [facility] as scheduled.  

## 2022-11-16 NOTE — Patient Instructions (Signed)
[facility]  Discharge Instructions: Thank you for choosing [facility] to provide your oncology and hematology care.  If you have a lab appointment with the Cancer Center, please come in thru the Main Entrance and check in at the main information desk.  Wear comfortable clothing and clothing appropriate for easy access to any Portacath or PICC line.   We strive to give you quality time with your provider. You may need to reschedule your appointment if you arrive late (15 or more minutes).  Arriving late affects you and other patients whose appointments are after yours.  Also, if you miss three or more appointments without notifying the office, you may be dismissed from the clinic at the provider's discretion.      For prescription refill requests, have your pharmacy contact our office and allow 72 hours for refills to be completed.    Today you received the following chemotherapy and/or immunotherapy agents Keytruda      To help prevent nausea and vomiting after your treatment, we encourage you to take your nausea medication as directed.  BELOW ARE SYMPTOMS THAT SHOULD BE REPORTED IMMEDIATELY: *FEVER GREATER THAN 100.4 F (38 C) OR HIGHER *CHILLS OR SWEATING *NAUSEA AND VOMITING THAT IS NOT CONTROLLED WITH YOUR NAUSEA MEDICATION *UNUSUAL SHORTNESS OF BREATH *UNUSUAL BRUISING OR BLEEDING *URINARY PROBLEMS (pain or burning when urinating, or frequent urination) *BOWEL PROBLEMS (unusual diarrhea, constipation, pain near the anus) TENDERNESS IN MOUTH AND THROAT WITH OR WITHOUT PRESENCE OF ULCERS (sore throat, sores in mouth, or a toothache) UNUSUAL RASH, SWELLING OR PAIN  UNUSUAL VAGINAL DISCHARGE OR ITCHING   Items with * indicate a potential emergency and should be followed up as soon as possible or go to the Emergency Department if any problems should occur.  Please show the CHEMOTHERAPY ALERT CARD or IMMUNOTHERAPY ALERT CARD at check-in to the  Emergency Department and triage nurse.  Should you have questions after your visit or need to cancel or reschedule your appointment, please contact [facility] [phone]  and follow the prompts.  Office hours are 8:00 a.m. to 4:30 p.m. Monday - Friday. Please note that voicemails left after 4:00 p.m. may not be returned until the following business day.  We are closed weekends and major holidays. You have access to a nurse at all times for urgent questions. Please call the main number to the clinic [phone] and follow the prompts.  For any non-urgent questions, you may also contact your provider using MyChart. We now offer e-Visits for anyone 18 and older to request care online for non-urgent symptoms. For details visit [email or web address].   Also download the MyChart app! Go to the app store, search "MyChart", open the app, select [facility], and log in with your MyChart username and password.  Masks are optional in the cancer centers. If you would like for your care team to wear a mask while they are taking care of you, please let them know. You may have one support person who is at least 76 years old accompany you for your appointments.  

## 2022-11-16 NOTE — Progress Notes (Signed)
[facility] Note  11/18/2022    CHIEF COMPLAINT Patient presents for Retina Follow Up  HISTORY OF PRESENT ILLNESS: Gabrielle [name] is a 76 y.o. female who presents to the clinic today for:   HPI     Retina Follow Up   Patient presents with  Wet AMD.  In both eyes.  Severity is moderate.  Duration of 5 weeks.  Since onset it is stable.  I, the attending physician,  performed the HPI with the patient and updated documentation appropriately.        Comments   Pt here for 5 wk ret f/u exu ARMD OU. Pt states VA is the same, having some excessive watering in OD for the past few days. Pt does not report taking any otc eye drops.       Last edited by Bernarda Caffey, MD on 11/18/2022  9:32 AM.     Pt states vision is stable, she states blood pressure is doing well  Referring physician: Janora Norlander, DO [place],  [place] [id number]  HISTORICAL INFORMATION:   Selected notes from the MEDICAL RECORD NUMBER Referred by Dr. Cristela Blue for concern of ARMD OU   CURRENT MEDICATIONS: Current Outpatient Medications (Ophthalmic Drugs)  Medication Sig   dorzolamide-timolol (COSOPT) 2-0.5 % ophthalmic solution INSTILL ONE DROP IN [facility] EYE TWICE DAILY   No current facility-administered medications for this visit. (Ophthalmic Drugs)   Current Outpatient Medications (Other)  Medication Sig   alendronate (FOSAMAX) 70 MG tablet TAKE ONE TABLET EVERY 7 DAYS ON AN EMPTY STOMACH WITH A FULL GLASS OF WATER   amLODipine (NORVASC) 10 MG tablet Take 1 tablet (10 mg total) by mouth daily. For blood pressure   atorvastatin (LIPITOR) 20 MG tablet Take 1 tablet (20 mg total) by mouth daily. For cholesterol   cholecalciferol (VITAMIN D3) 25 MCG (1000 UT) tablet Take 1,000 Units by mouth daily.   CRANBERRY FRUIT PO Take 1 tablet by mouth daily.   diclofenac Sodium (VOLTAREN) 1 % GEL APPLY 4 GRAMS TO AFFECTED AREA(S) 4 TIMES A DAY   FEROSUL 325 (65 Fe) MG tablet  TAKE  (1)  TABLET TWICE A DAY WITH MEALS (BREAKFAST AND SUPPER)   hydrALAZINE (APRESOLINE) 25 MG tablet Take 1 tablet (25 mg total) by mouth 2 (two) times daily.   losartan-hydrochlorothiazide (HYZAAR) 100-25 MG tablet TAKE 1 TABLET DAILY   potassium chloride SA (KLOR-CON M) 20 MEQ tablet TAKE ONE TABLET THREE TIMES DAILY   spironolactone (ALDACTONE) 25 MG tablet Take 1 tablet (25 mg total) by mouth daily.   ELIQUIS 5 MG TABS tablet TAKE ONE TABLET TWICE DAILY   No current facility-administered medications for this visit. (Other)   REVIEW OF SYSTEMS: ROS   Positive for: Gastrointestinal, Genitourinary, Musculoskeletal, Cardiovascular, Eyes Negative for: Constitutional, Neurological, Skin, HENT, Endocrine, Respiratory, Psychiatric, Allergic/Imm, Heme/Lymph Last edited by Kingsley Spittle, COT on 11/18/2022  8:29 AM.     ALLERGIES Allergies  Allergen Reactions   Lisinopril Cough   PAST MEDICAL HISTORY Past Medical History:  Diagnosis Date   Arthritis    Cancer [facility])    Cataract    OU   Colon cancer ([place])    Family history of pancreatic cancer 08/21/2020   Family history of uterine cancer 08/21/2020   H/O cesarean section    Hx of tonsillectomy    Hyperlipidemia    Hypertension    Macular degeneration    Exu ARMD OU  Paroxysmal atrial fibrillation ([place]) 07/05/2020   Past Surgical History:  Procedure Laterality Date   BIOPSY  04/06/2020   Procedure: BIOPSY;  Surgeon: Rogene Houston, MD;  Location: [facility];  Service: Endoscopy;;   CARPAL TUNNEL RELEASE Right    CATARACT EXTRACTION W/PHACO Left 10/11/2019   Procedure: CATARACT EXTRACTION PHACO AND INTRAOCULAR LENS PLACEMENT ([place]);  Surgeon: Baruch Goldmann, MD;  Location: [facility] ORS;  Service: Ophthalmology;  Laterality: Left;  CDE: 8.56   CATARACT EXTRACTION W/PHACO Right 10/25/2019   Procedure: CATARACT EXTRACTION PHACO AND INTRAOCULAR LENS PLACEMENT (IOC);  Surgeon: Baruch Goldmann, MD;  Location: [facility] ORS;  Service:  Ophthalmology;  Laterality: Right;  CDE: 5.67   CESAREAN SECTION     COLONOSCOPY N/A 04/06/2020   Procedure: COLONOSCOPY;  Surgeon: Rogene Houston, MD;  Location: [facility] SU[facility];  Service: Endoscopy;  Laterality: N/A;   CYSTOSCOPY WITH BIOPSY N/A 04/08/2020   Procedure: CYSTOSCOPY WITH BIOPSY;  Surgeon: Cleon Gustin, MD;  Location: [facility] ORS;  Service: Urology;  Laterality: N/A;   ESOPHAGOGASTRODUODENOSCOPY N/A 04/05/2020   Procedure: ESOPHAGOGASTRODUODENOSCOPY (EGD);  Surgeon: Rogene Houston, MD;  Location: [facility];  Service: Endoscopy;  Laterality: N/A;   PARTIAL COLECTOMY N/A 04/08/2020   Procedure: PARTIAL COLECTOMY;  Surgeon: Virl Cagey, MD;  Location: [facility] ORS;  Service: General;  Laterality: N/A;   PORTACATH PLACEMENT Left 05/18/2020   Procedure: INSERTION PORT-A-CATH (ATTACHED CATHETER IN LEFT SUBCLAVIAN);  Surgeon: Virl Cagey, MD;  Location: [facility] ORS;  Service: General;  Laterality: Left;   TONSILLECTOMY     FAMILY HISTORY Family History  Problem Relation Age of Onset   Macular degeneration Mother    Stroke Mother    Hypertension Mother    Dementia Mother    Lung cancer Father        dx late 65s; smoking hx   Diabetes Sister    Hypertension Sister    Leukemia Paternal Uncle        d. 48s   Cancer Maternal Aunt        ovarian or endometrial dx 87s   Pancreatic cancer Paternal Uncle        d. late 61s   SOCIAL HISTORY Social History   Tobacco Use   Smoking status: Never   Smokeless tobacco: Never  Vaping Use   Vaping Use: Never used  Substance Use Topics   Alcohol use: No   Drug use: No       OPHTHALMIC EXAM: Base Eye Exam     Visual Acuity (Snellen - Linear)       Right Left   Dist cc CF at 3' 20/150 +2   Dist ph cc NI 20/70 -3    Correction: Glasses         Tonometry (Tonopen, 8:36 AM)       Right Left   Pressure 20 12         Pupils       Pupils Dark Light Shape React APD   Right PERRL 3 2 Round Brisk None   Left PERRL  3 2 Round Brisk None         Visual Fields (Counting fingers)       Left Right    Full Full         Extraocular Movement       Right Left    Full, Ortho Full, Ortho         Neuro/Psych     Oriented x3: Yes  Mood/Affect: Normal         Dilation     Both eyes: 1.0% Mydriacyl, 2.5% Phenylephrine @ 8:37 AM           Slit Lamp and Fundus Exam     Slit Lamp Exam       Right Left   Lids/Lashes Dermatochalasis - upper lid Dermatochalasis - upper lid   Conjunctiva/Sclera 1+ Injection Trace Injection   Cornea Arcus, 3+ fine Punctpate epithelial erosions Arcus, 3+ fine Punctate epithelial erosions, mild tear film debris   Anterior Chamber Deep and quiet Deep and quiet   Iris Round and well dilated Round and well dilated   Lens PCIOL in good position, 1+ Posterior capsular opacification PC IOL in good position with open PC   Anterior Vitreous Vitreous syneresis, Posterior vitreous detachment, vitreous condensations Vitreous syneresis, Posterior vitreous detachment, silicone oil micro drops         Fundus Exam       Right Left   Disc pink and sharp, compact mild pallor, sharp, Compact, vascular loops superiorly   C/D Ratio 0.2 0.2   Macula Blunted foveal reflex, Drusen, Pigment clumping and atrophy, central thickening/pigmented disciform scar, +PED, punctate IRH superior macula within atrophy, persistent, trace cystic changes overlying central scar - stable Blunted foveal reflex, central thickening, RPE clumping and atrophy, Drusen, trace cystic changes overlying PED - persistent / slightly improved, no heme   Vessels Vascular attenuation, Tortuous Vascular attenuation, Tortuous   Periphery Attached, mild Reticular degeneration Attached, mild Reticular degeneration           Refraction     Wearing Rx       Sphere Cylinder Add   Right [place] Sphere +3.50   Left -0.50 Sphere +3.50    Type: PAL           IMAGING AND PROCEDURES  Imaging and  Procedures for 03/21/18  OCT, Retina - OU - Both Eyes       Right Eye Quality was good. Central Foveal Thickness: 505. Progression has been stable. Findings include no SRF, abnormal foveal contour, retinal drusen , outer retinal tubulation, subretinal hyper-reflective material, disciform scar, epiretinal membrane, intraretinal fluid, pigment epithelial detachment, outer retinal atrophy (Stable sub-retinal scar, mild persistent cystic changes overlying ).   Left Eye Quality was good. Central Foveal Thickness: 328. Progression has been stable. Findings include no SRF, abnormal foveal contour, retinal drusen , subretinal hyper-reflective material, intraretinal fluid, pigment epithelial detachment, outer retinal atrophy (Persistent cystic changes overlying PED/SRHM ).   Notes *Images captured and stored on drive  Diagnosis / Impression:  Exudative ARMD OU OD: Stable sub-retinal scar, mild persistent cystic changes overlying  OS: Persistent cystic changes overlying PED/SRHM   Clinical management:  See below  Abbreviations: NFP - Normal foveal profile. CME - cystoid macular edema. PED - pigment epithelial detachment. IRF - intraretinal fluid. SRF - subretinal fluid. EZ - ellipsoid zone. ERM - epiretinal membrane. ORA - outer retinal atrophy. ORT - outer retinal tubulation. SRHM - subretinal hyper-reflective material      Intravitreal Injection, Pharmacologic Agent - OS - Left Eye       Time Out 11/18/2022. 9:17 AM. Confirmed correct patient, procedure, site, and patient consented.   Anesthesia Topical anesthesia was used. Anesthetic medications included Lidocaine 2%, Proparacaine 0.5%.   Procedure Preparation included 5% betadine to ocular surface, eyelid speculum. A (32g) needle was used.   Injection: 2 mg aflibercept 2 MG/0.05ML   Route: Intravitreal, Site: Left Eye  B and E: [id number], Lot: [id number], Expiration date: 02/02/2024, Waste: 0 mL   Post-op Post injection exam  found visual acuity of at least counting fingers. The patient tolerated the procedure well. There were no complications. The patient received written and verbal post procedure care education. Post injection medications were not given.            ASSESSMENT/PLAN:    ICD-10-CM   1. Exudative age-related macular degeneration of both eyes with active choroidal neovascularization (HCC)  H35.3231 OCT, Retina - OU - Both Eyes    Intravitreal Injection, Pharmacologic Agent - OS - Left Eye    aflibercept (EYLEA) SOLN 2 mg    2. Posterior vitreous detachment of both eyes  H43.813     3. Essential hypertension  I10     4. Hypertensive retinopathy of both eyes  H35.033     5. Pseudophakia of both eyes  Z96.1     6. Ocular hypertension, bilateral  H40.053     7. Dry eyes  H04.123      1. Exudative age-related macular degeneration, both eyes  - severe exudative disease with very active CNVM OU at presentation in January 2019  - S/P IVA OD #1 (01.04.19), #2 (02.15.19)  - S/P IVA OS #1 (01.18.19), #2 (02.15.19)  - switched to Eylea 3.18.19 due to severity of disease  - S/P IVE OD #1 (03.18.19), #2 (04.17.19), #3 (05.15.19), #4 (10.02.19),  #5 (01.15.20), #6 (06.04.20), #7 (10.29.20), #8 (01.21.21), #9 (05.27.21), #10 (07.22.21) -- injections held due to stable disciform scar  - S/P IVE OS #1 (03.18.19), #2 (04.17.19), #3 (05.15.19), #4 (06.12.19), #5 (07.10.19), #6 (08.07.19), #7 (09.04.19), #8 (10.02.19), #9 (11.06.19), #10 (12.11.19), #11 (01.15.20), #12 (02.20.20), #13 (03.26.20), #14 (04.30.20), #15 (06.04.20), #16 (07.16.20), # 17 (08.20.20), #18 (09.24.20), #19 (10.29.20), #20 (12.03.20), #21 (01.21.21), #22 (03.18.21), #23 (05.27.21), #24 (07.22.21), #25 (11.11.21), #26 (12.09.21), #27 (01.06.22), #28 (2.7.22), #29 (03.24.22), #30 (4.28.22), #31 (05.31.22), #32 (06.30.22), #33 (08.04.22), #34 (09.15.22), #35 (10.27.22), #36 (12.08.22), #37 (01.12.23), #38 (02.16.23), #39 (03.30.23), #40  (05.11.23), #41 (06.22.23), #42 (07.27.23), #43 (09.01.23), #44 (10.06.23), #45 (11.10.23)  - IVE OS held from 7.22.21 to 11.11.21 due to TIA/stroke on 8.1.21  - OCT OD: Stable sub-retinal scar, mild persistent cystic changes overlying; OS: Persistent cystic changes overlying PED/SRHM  - exam OS shows faint IRH nasal fovea stably resolved, no heme; OD with focal IRH superior macula within atrophy  - BCVA OD stable at CF 3'; OS 20/70 -- stable  - recommend IVE OS #46 today, 12.15.23 w/ f/u at 4 wks - will cont to hold OD  - Eylea informed consent form re-signed and scanned on 07.27.2023  - Good Days approved 12/05/2020 - 12/04/2022  - f/u in 4 weeks, sooner prn for DFE/OCT/possible injection(s)   2. PVD / vitreous syneresis  - Discussed findings and prognosis   - No RT or RD on 360 exam  - Reviewed s/s of RT/RD  - Strict return precautions for any such RT/RD signs/symptoms   3,4. Hypertensive retinopathy OU - exam shows persistent focal IRH OD within GA - discussed importance of tight BP control - monitor  5. Pseudophakia OU  - s/p CE/IOL (Dr. Marisa Hua, 11.2020)  - beautiful surgeries w/ IOLs in excellent position, doing well  - monitor   6. Ocular Hypertension OU  - IOP 20,12 OU  - cont Cosopt BID OU  7. Dry eyes OU - recommend artificial tears and lubricating ointment as needed  Ophthalmic Meds Ordered this visit:  Meds ordered this encounter  Medications   aflibercept (EYLEA) SOLN 2 mg     Return in about 4 weeks (around 12/16/2022) for f/u exu ARMD OU, DFE, OCT.  There are no Patient Instructions on file for this visit.  This document serves as a record of services personally performed by Gardiner Sleeper, MD, PhD. It was created on their behalf by San Jetty. Owens Shark, OA an ophthalmic technician. The creation of this record is the provider's dictation and/or activities during the visit.    Electronically signed by: San Jetty. Owens Shark, [place] 12.13.2023 1:36 AM  Gardiner Sleeper,  M.D., Ph.D. Diseases & Surgery of the Retina and Vitreous [facility]  I have reviewed the above documentation for accuracy and completeness, and I agree with the above. Gardiner Sleeper, M.D., Ph.D. 11/19/22 1:37 AM  Abbreviations: M myopia (nearsighted); A astigmatism; H hyperopia (farsighted); P presbyopia; Mrx spectacle prescription;  CTL contact lenses; OD right eye; OS left eye; OU both eyes  XT exotropia; ET esotropia; PEK punctate epithelial keratitis; PEE punctate epithelial erosions; DES dry eye syndrome; MGD meibomian gland dysfunction; ATs artificial tears; PFAT's preservative free artificial tears; [place] nuclear sclerotic cataract; PSC posterior subcapsular cataract; ERM epi-retinal membrane; PVD posterior vitreous detachment; RD retinal detachment; DM diabetes mellitus; DR diabetic retinopathy; NPDR non-proliferative diabetic retinopathy; PDR proliferative diabetic retinopathy; CSME clinically significant macular edema; DME diabetic macular edema; dbh dot blot hemorrhages; CWS cotton wool spot; POAG primary open angle glaucoma; C/D cup-to-disc ratio; HVF humphrey visual field; GVF goldmann visual field; OCT optical coherence tomography; IOP intraocular pressure; BRVO Branch retinal vein occlusion; CRVO central retinal vein occlusion; CRAO central retinal artery occlusion; BRAO branch retinal artery occlusion; RT retinal tear; SB scleral buckle; PPV pars plana vitrectomy; VH Vitreous hemorrhage; PRP panretinal laser photocoagulation; IVK intravitreal kenalog; VMT vitreomacular traction; MH Macular hole;  NVD neovascularization of the disc; NVE neovascularization elsewhere; AREDS age related eye disease study; ARMD age related macular degeneration; POAG primary open angle glaucoma; EBMD epithelial/anterior basement membrane dystrophy; ACIOL anterior chamber intraocular lens; IOL intraocular lens; PCIOL posterior chamber intraocular lens; Phaco/IOL phacoemulsification with  intraocular lens placement; [place] photorefractive keratectomy; LASIK laser assisted in situ keratomileusis; HTN hypertension; DM diabetes mellitus; COPD chronic obstructive pulmonary disease

## 2022-11-18 ENCOUNTER — Other Ambulatory Visit (HOSPITAL_COMMUNITY): Payer: Self-pay | Admitting: Hematology

## 2022-11-18 ENCOUNTER — Ambulatory Visit (INDEPENDENT_AMBULATORY_CARE_PROVIDER_SITE_OTHER): Payer: Medicare HMO | Admitting: Ophthalmology

## 2022-11-18 ENCOUNTER — Encounter (INDEPENDENT_AMBULATORY_CARE_PROVIDER_SITE_OTHER): Payer: Self-pay | Admitting: Ophthalmology

## 2022-11-18 DIAGNOSIS — I10 Essential (primary) hypertension: Secondary | ICD-10-CM | POA: Diagnosis not present

## 2022-11-18 DIAGNOSIS — H353231 Exudative age-related macular degeneration, bilateral, with active choroidal neovascularization: Secondary | ICD-10-CM

## 2022-11-18 DIAGNOSIS — H35033 Hypertensive retinopathy, bilateral: Secondary | ICD-10-CM | POA: Diagnosis not present

## 2022-11-18 DIAGNOSIS — H40053 Ocular hypertension, bilateral: Secondary | ICD-10-CM

## 2022-11-18 DIAGNOSIS — H04123 Dry eye syndrome of bilateral lacrimal glands: Secondary | ICD-10-CM | POA: Diagnosis not present

## 2022-11-18 DIAGNOSIS — H43813 Vitreous degeneration, bilateral: Secondary | ICD-10-CM | POA: Diagnosis not present

## 2022-11-18 DIAGNOSIS — Z961 Presence of intraocular lens: Secondary | ICD-10-CM

## 2022-11-18 MED ORDER — AFLIBERCEPT 2MG/0.05ML IZ SOLN FOR KALEIDOSCOPE
2.0000 mg | INTRAVITREAL | Status: AC | PRN
  Administered 2022-11-18: 2 mg via INTRAVITREAL

## 2022-12-06 ENCOUNTER — Other Ambulatory Visit: Payer: Self-pay

## 2022-12-06 DIAGNOSIS — C189 Malignant neoplasm of colon, unspecified: Secondary | ICD-10-CM

## 2022-12-07 ENCOUNTER — Other Ambulatory Visit: Payer: Medicare HMO

## 2022-12-07 ENCOUNTER — Ambulatory Visit: Payer: Medicare HMO

## 2022-12-07 ENCOUNTER — Inpatient Hospital Stay: Payer: Medicare HMO

## 2022-12-07 ENCOUNTER — Inpatient Hospital Stay: Payer: Medicare HMO | Attending: Hematology

## 2022-12-07 VITALS — BP 129/54 | HR 64 | Temp 97.3°F | Resp 18 | Wt 175.4 lb

## 2022-12-07 DIAGNOSIS — C189 Malignant neoplasm of colon, unspecified: Secondary | ICD-10-CM

## 2022-12-07 DIAGNOSIS — Z79899 Other long term (current) drug therapy: Secondary | ICD-10-CM | POA: Diagnosis not present

## 2022-12-07 DIAGNOSIS — C182 Malignant neoplasm of ascending colon: Secondary | ICD-10-CM | POA: Insufficient documentation

## 2022-12-07 DIAGNOSIS — Z5112 Encounter for antineoplastic immunotherapy: Secondary | ICD-10-CM | POA: Diagnosis not present

## 2022-12-07 LAB — COMPREHENSIVE METABOLIC PANEL
ALT: 18 U/L (ref 0–44)
AST: 22 U/L (ref 15–41)
Albumin: 3.8 g/dL (ref 3.5–5.0)
Alkaline Phosphatase: 72 U/L (ref 38–126)
Anion gap: 8 (ref 5–15)
BUN: 28 mg/dL — ABNORMAL HIGH (ref 8–23)
CO2: 24 mmol/L (ref 22–32)
Calcium: 9.3 mg/dL (ref 8.9–10.3)
Chloride: 102 mmol/L (ref 98–111)
Creatinine, Ser: 1.51 mg/dL — ABNORMAL HIGH (ref 0.44–1.00)
GFR, Estimated: 36 mL/min — ABNORMAL LOW (ref >60)
Glucose, Bld: 150 mg/dL — ABNORMAL HIGH (ref 70–99)
Potassium: 4.5 mmol/L (ref 3.5–5.1)
Sodium: 134 mmol/L — ABNORMAL LOW (ref 135–145)
Total Bilirubin: 0.6 mg/dL (ref 0.3–1.2)
Total Protein: 7 g/dL (ref 6.5–8.1)

## 2022-12-07 LAB — CBC WITH DIFFERENTIAL/PLATELET
Abs Immature Granulocytes: 0.03 10*3/uL (ref 0.00–0.07)
Basophils Absolute: 0 10*3/uL (ref 0.0–0.1)
Basophils Relative: 0 %
Eosinophils Absolute: 0.3 10*3/uL (ref 0.0–0.5)
Eosinophils Relative: 5 %
HCT: 33.8 % — ABNORMAL LOW (ref 36.0–46.0)
Hemoglobin: 11 g/dL — ABNORMAL LOW (ref 12.0–15.0)
Immature Granulocytes: 1 %
Lymphocytes Relative: 20 %
Lymphs Abs: 1.2 10*3/uL (ref 0.7–4.0)
MCH: 31.5 pg (ref 26.0–34.0)
MCHC: 32.5 g/dL (ref 30.0–36.0)
MCV: 96.8 fL (ref 80.0–100.0)
Monocytes Absolute: 0.5 10*3/uL (ref 0.1–1.0)
Monocytes Relative: 8 %
Neutro Abs: 3.9 10*3/uL (ref 1.7–7.7)
Neutrophils Relative %: 66 %
Platelets: 170 10*3/uL (ref 150–400)
RBC: 3.49 MIL/uL — ABNORMAL LOW (ref 3.87–5.11)
RDW: 13.2 % (ref 11.5–15.5)
WBC: 5.9 10*3/uL (ref 4.0–10.5)
nRBC: 0 % (ref 0.0–0.2)

## 2022-12-07 LAB — MAGNESIUM: Magnesium: 1.9 mg/dL (ref 1.7–2.4)

## 2022-12-07 LAB — TSH: TSH: 2.246 u[IU]/mL (ref 0.350–4.500)

## 2022-12-07 MED ORDER — SODIUM CHLORIDE 0.9% FLUSH
10.0000 mL | INTRAVENOUS | Status: DC | PRN
  Administered 2022-12-07: 10 mL

## 2022-12-07 MED ORDER — HEPARIN SOD (PORK) LOCK FLUSH 100 UNIT/ML IV SOLN
500.0000 [IU] | Freq: Once | INTRAVENOUS | Status: AC | PRN
  Administered 2022-12-07: 500 [IU]

## 2022-12-07 MED ORDER — SODIUM CHLORIDE 0.9 % IV SOLN
200.0000 mg | Freq: Once | INTRAVENOUS | Status: AC
  Administered 2022-12-07: 200 mg via INTRAVENOUS
  Filled 2022-12-07: qty 8

## 2022-12-07 MED ORDER — SODIUM CHLORIDE 0.9 % IV SOLN: Freq: Once | INTRAVENOUS | Status: AC

## 2022-12-07 NOTE — Progress Notes (Signed)
Pt presents today for Keytruda per provider's order. Vital signs and labs WNL for treatment. Okay to proceed with treatment today.  Keytruda given today per MD orders. Tolerated infusion without adverse affects. Vital signs stable. No complaints at this time. Discharged from clinic ambulatory in stable condition. Alert and oriented x 3. F/U with [facility] as scheduled.   

## 2022-12-07 NOTE — Patient Instructions (Signed)
[facility]  Discharge Instructions: Thank you for choosing [facility] to provide your oncology and hematology care.  If you have a lab appointment with the Cancer Center, please come in thru the Main Entrance and check in at the main information desk.  Wear comfortable clothing and clothing appropriate for easy access to any Portacath or PICC line.   We strive to give you quality time with your provider. You may need to reschedule your appointment if you arrive late (15 or more minutes).  Arriving late affects you and other patients whose appointments are after yours.  Also, if you miss three or more appointments without notifying the office, you may be dismissed from the clinic at the provider's discretion.      For prescription refill requests, have your pharmacy contact our office and allow 72 hours for refills to be completed.    Today you received the following chemotherapy and/or immunotherapy agents Keytruda   To help prevent nausea and vomiting after your treatment, we encourage you to take your nausea medication as directed.  Pembrolizumab Injection What is this medication? PEMBROLIZUMAB (PEM broe LIZ ue mab) treats some types of cancer. It works by helping your immune system slow or stop the spread of cancer cells. It is a monoclonal antibody. This medicine may be used for other purposes; ask your health care provider or pharmacist if you have questions. COMMON BRAND NAME(S): Keytruda What should I tell my care team before I take this medication? They need to know if you have any of these conditions: Allogeneic stem cell transplant (uses someone else's stem cells) Autoimmune diseases, such as Crohn disease, ulcerative colitis, lupus History of chest radiation Nervous system problems, such as Guillain-Barre syndrome, myasthenia gravis Organ transplant An unusual or allergic reaction to pembrolizumab, other medications, foods, dyes, or  preservatives Pregnant or trying to get pregnant Breast-feeding How should I use this medication? This medication is injected into a vein. It is given by your care team in a hospital or clinic setting. A special MedGuide will be given to you before each treatment. Be sure to read this information carefully each time. Talk to your care team about the use of this medication in children. While it may be prescribed for children as young as 6 months for selected conditions, precautions do apply. Overdosage: If you think you have taken too much of this medicine contact a poison control center or emergency room at once. NOTE: This medicine is only for you. Do not share this medicine with others. What if I miss a dose? Keep appointments for follow-up doses. It is important not to miss your dose. Call your care team if you are unable to keep an appointment. What may interact with this medication? Interactions have not been studied. This list may not describe all possible interactions. Give your health care provider a list of all the medicines, herbs, non-prescription drugs, or dietary supplements you use. Also tell them if you smoke, drink alcohol, or use illegal drugs. Some items may interact with your medicine. What should I watch for while using this medication? Your condition will be monitored carefully while you are receiving this medication. You may need blood work while taking this medication. This medication may cause serious skin reactions. They can happen weeks to months after starting the medication. Contact your care team right away if you notice fevers or flu-like symptoms with a rash. The rash may be red or purple and then turn into   blisters or peeling of the skin. You may also notice a red rash with swelling of the face, lips, or lymph nodes in your neck or under your arms. Tell your care team right away if you have any change in your eyesight. Talk to your care team if you may be pregnant.  Serious birth defects can occur if you take this medication during pregnancy and for 4 months after the last dose. You will need a negative pregnancy test before starting this medication. Contraception is recommended while taking this medication and for 4 months after the last dose. Your care team can help you find the option that works for you. Do not breastfeed while taking this medication and for 4 months after the last dose. What side effects may I notice from receiving this medication? Side effects that you should report to your care team as soon as possible: Allergic reactions--skin rash, itching, hives, swelling of the face, lips, tongue, or throat Dry cough, shortness of breath or trouble breathing Eye pain, redness, irritation, or discharge with blurry or decreased vision Heart muscle inflammation--unusual weakness or fatigue, shortness of breath, chest pain, fast or irregular heartbeat, dizziness, swelling of the ankles, feet, or hands Hormone gland problems--headache, sensitivity to light, unusual weakness or fatigue, dizziness, fast or irregular heartbeat, increased sensitivity to cold or heat, excessive sweating, constipation, hair loss, increased thirst or amount of urine, tremors or shaking, irritability Infusion reactions--chest pain, shortness of breath or trouble breathing, feeling faint or lightheaded Kidney injury (glomerulonephritis)--decrease in the amount of urine, red or dark brown urine, foamy or bubbly urine, swelling of the ankles, hands, or feet Liver injury--right upper belly pain, loss of appetite, nausea, light-colored stool, dark yellow or brown urine, yellowing skin or eyes, unusual weakness or fatigue Pain, tingling, or numbness in the hands or feet, muscle weakness, change in vision, confusion or trouble speaking, loss of balance or coordination, trouble walking, seizures Rash, fever, and swollen lymph nodes Redness, blistering, peeling, or loosening of the skin,  including inside the mouth Sudden or severe stomach pain, bloody diarrhea, fever, nausea, vomiting Side effects that usually do not require medical attention (report to your care team if they continue or are bothersome): Bone, joint, or muscle pain Diarrhea Fatigue Loss of appetite Nausea Skin rash This list may not describe all possible side effects. Call your doctor for medical advice about side effects. You may report side effects to FDA at 1-800-FDA-1088. Where should I keep my medication? This medication is given in a hospital or clinic. It will not be stored at home. NOTE: This sheet is a summary. It may not cover all possible information. If you have questions about this medicine, talk to your doctor, pharmacist, or health care provider.  2023 Elsevier/Gold Standard (2013-08-12 00:00:00)   BELOW ARE SYMPTOMS THAT SHOULD BE REPORTED IMMEDIATELY: *FEVER GREATER THAN 100.4 F (38 C) OR HIGHER *CHILLS OR SWEATING *NAUSEA AND VOMITING THAT IS NOT CONTROLLED WITH YOUR NAUSEA MEDICATION *UNUSUAL SHORTNESS OF BREATH *UNUSUAL BRUISING OR BLEEDING *URINARY PROBLEMS (pain or burning when urinating, or frequent urination) *BOWEL PROBLEMS (unusual diarrhea, constipation, pain near the anus) TENDERNESS IN MOUTH AND THROAT WITH OR WITHOUT PRESENCE OF ULCERS (sore throat, sores in mouth, or a toothache) UNUSUAL RASH, SWELLING OR PAIN  UNUSUAL VAGINAL DISCHARGE OR ITCHING   Items with * indicate a potential emergency and should be followed up as soon as possible or go to the Emergency Department if any problems should occur.  Please show the CHEMOTHERAPY   ALERT CARD or IMMUNOTHERAPY ALERT CARD at check-in to the Emergency Department and triage nurse.  Should you have questions after your visit or need to cancel or reschedule your appointment, please contact [facility] [phone]  and follow the prompts.  Office hours are 8:00 a.m. to 4:30 p.m. Monday - Friday. Please  note that voicemails left after 4:00 p.m. may not be returned until the following business day.  We are closed weekends and major holidays. You have access to a nurse at all times for urgent questions. Please call the main number to the clinic [phone] and follow the prompts.  For any non-urgent questions, you may also contact your provider using MyChart. We now offer e-Visits for anyone 18 and older to request care online for non-urgent symptoms. For details visit [email or web address].   Also download the MyChart app! Go to the app store, search "MyChart", open the app, select [facility], and log in with your MyChart username and password.   

## 2022-12-08 LAB — CEA: CEA: 2.1 ng/mL (ref 0.0–4.7)

## 2022-12-14 NOTE — Progress Notes (Signed)
[facility] Note  12/16/2022    CHIEF COMPLAINT Patient presents for Retina Follow Up  HISTORY OF PRESENT ILLNESS: Gabrielle [name] is a 77 y.o. female who presents to the clinic today for:   HPI     Retina Follow Up   Patient presents with  Wet AMD.  This started 4 weeks ago.  Duration of 4 weeks.        Comments   4 week retina follow up AMD and IVE OS pt is reporting no vision changes noticed       Last edited by Bernarda Caffey, MD on 12/16/2022  9:02 AM.    Pt states vision seems stable, BP is doing well  Referring physician: Janora Norlander, DO [place],  [place] [address]  HISTORICAL INFORMATION:   Selected notes from the MEDICAL RECORD NUMBER Referred by Dr. Cristela Blue for concern of ARMD OU   CURRENT MEDICATIONS: Current Outpatient Medications (Ophthalmic Drugs)  Medication Sig   dorzolamide-timolol (COSOPT) 2-0.5 % ophthalmic solution INSTILL ONE DROP IN [facility] EYE TWICE DAILY   No current facility-administered medications for this visit. (Ophthalmic Drugs)   Current Outpatient Medications (Other)  Medication Sig   alendronate (FOSAMAX) 70 MG tablet TAKE ONE TABLET EVERY 7 DAYS ON AN EMPTY STOMACH WITH A FULL GLASS OF WATER   amLODipine (NORVASC) 10 MG tablet Take 1 tablet (10 mg total) by mouth daily. For blood pressure   atorvastatin (LIPITOR) 20 MG tablet Take 1 tablet (20 mg total) by mouth daily. For cholesterol   cholecalciferol (VITAMIN D3) 25 MCG (1000 UT) tablet Take 1,000 Units by mouth daily.   CRANBERRY FRUIT PO Take 1 tablet by mouth daily.   diclofenac Sodium (VOLTAREN) 1 % GEL APPLY 4 GRAMS TO AFFECTED AREA(S) 4 TIMES A DAY   ELIQUIS 5 MG TABS tablet TAKE ONE TABLET TWICE DAILY   FEROSUL 325 (65 Fe) MG tablet TAKE  (1)  TABLET TWICE A DAY WITH MEALS (BREAKFAST AND SUPPER)   hydrALAZINE (APRESOLINE) 25 MG tablet Take 1 tablet (25 mg total) by mouth 2 (two) times daily.   losartan-hydrochlorothiazide  (HYZAAR) 100-25 MG tablet TAKE 1 TABLET DAILY   potassium chloride SA (KLOR-CON M) 20 MEQ tablet TAKE ONE TABLET THREE TIMES DAILY   spironolactone (ALDACTONE) 25 MG tablet Take 1 tablet (25 mg total) by mouth daily.   No current facility-administered medications for this visit. (Other)   REVIEW OF SYSTEMS: ROS   Positive for: Eyes Last edited by Bernarda Caffey, MD on 12/16/2022  9:02 AM.     ALLERGIES Allergies  Allergen Reactions   Lisinopril Cough   PAST MEDICAL HISTORY Past Medical History:  Diagnosis Date   Arthritis    Cancer [facility])    Cataract    OU   Colon cancer ([place])    Family history of pancreatic cancer 08/21/2020   Family history of uterine cancer 08/21/2020   H/O cesarean section    Hx of tonsillectomy    Hyperlipidemia    Hypertension    Macular degeneration    Exu ARMD OU   Paroxysmal atrial fibrillation ([place]) 07/05/2020   Past Surgical History:  Procedure Laterality Date   BIOPSY  04/06/2020   Procedure: BIOPSY;  Surgeon: Rogene Houston, MD;  Location: [facility];  Service: Endoscopy;;   CARPAL TUNNEL RELEASE Right    CATARACT EXTRACTION W/PHACO Left 10/11/2019   Procedure: CATARACT EXTRACTION PHACO AND INTRAOCULAR LENS PLACEMENT ([facility]);  Surgeon: Baruch Goldmann, MD;  Location: [facility] OR[facility];  Service: Ophthalmology;  Laterality: Left;  CDE: 8.56   CATARACT EXTRACTION W/PHACO Right 10/25/2019   Procedure: CATARACT EXTRACTION PHACO AND INTRAOCULAR LENS PLACEMENT (IOC);  Surgeon: Baruch Goldmann, MD;  Location: [facility];  Service: Ophthalmology;  Laterality: Right;  CDE: 5.67   CESAREAN SECTION     COLONOSCOPY N/A 04/06/2020   Procedure: COLONOSCOPY;  Surgeon: Rogene Houston, MD;  Location: [facility];  Service: Endoscopy;  Laterality: N/A;   CYSTOSCOPY WITH BIOPSY N/A 04/08/2020   Procedure: CYSTOSCOPY WITH BIOPSY;  Surgeon: Cleon Gustin, MD;  Location: [facility];  Service: Urology;  Laterality: N/A;   ESOPHAGOGASTRODUODENOSCOPY N/A 04/05/2020   Procedure:  ESOPHAGOGASTRODUODENOSCOPY (EGD);  Surgeon: Rogene Houston, MD;  [facility];  Service: Endoscopy;  Laterality: N/A;   PARTIAL COLECTOMY N/A 04/08/2020   Procedure: PARTIAL COLECTOMY;  Surgeon: Virl Cagey, MD;  Location: [facility];  Service: General;  Laterality: N/A;   PORTACATH PLACEMENT Left 05/18/2020   Procedure: INSERTION PORT-A-CATH (ATTACHED CATHETER IN LEFT SUBCLAVIAN);  Surgeon: Virl Cagey, MD;  Location: [facility];  Service: General;  Laterality: Left;   TONSILLECTOMY     FAMILY HISTORY Family History  Problem Relation Age of Onset   Macular degeneration Mother    Stroke Mother    Hypertension Mother    Dementia Mother    Lung cancer Father        dx late 71s; smoking hx   Diabetes Sister    Hypertension Sister    Leukemia Paternal Uncle        d. 48s   Cancer Maternal Aunt        ovarian or endometrial dx 67s   Pancreatic cancer Paternal Uncle        d. late 73s   SOCIAL HISTORY Social History   Tobacco Use   Smoking status: Never   Smokeless tobacco: Never  Vaping Use   Vaping Use: Never used  Substance Use Topics   Alcohol use: No   Drug use: No       OPHTHALMIC EXAM: Base Eye Exam     Visual Acuity (Snellen - Linear)       Right Left   Dist cc CF at 3' 20/80 -2   Dist ph cc NI NI    Correction: Glasses         Tonometry (Tonopen, 7:56 AM)       Right Left   Pressure 18 18         Pupils       Pupils Dark Light Shape React APD   Right PERRL 3 2 Round Brisk None   Left PERRL 3 2 Round Brisk None         Visual Fields       Left Right    Full Full         Extraocular Movement       Right Left    Full, Ortho Full, Ortho         Neuro/Psych     Oriented x3: Yes   Mood/Affect: Normal         Dilation     Both eyes: 2.5% Phenylephrine @ 7:56 AM           Slit Lamp and Fundus Exam     Slit Lamp Exam       Right Left   Lids/Lashes Dermatochalasis - upper lid Dermatochalasis - upper  lid  Conjunctiva/Sclera 1+ Injection Trace Injection   Cornea Arcus, 3+ fine Punctpate epithelial erosions Arcus, 3+ fine Punctate epithelial erosions, mild tear film debris   Anterior Chamber Deep and quiet Deep and quiet   Iris Round and well dilated Round and well dilated   Lens PCIOL in good position, 1+ Posterior capsular opacification PC IOL in good position with open PC   Anterior Vitreous Vitreous syneresis, Posterior vitreous detachment, vitreous condensations Vitreous syneresis, Posterior vitreous detachment, silicone oil micro drops         Fundus Exam       Right Left   Disc pink and sharp, compact mild pallor, sharp, Compact, vascular loops superiorly   C/D Ratio 0.2 0.2   Macula Blunted foveal reflex, Drusen, Pigment clumping and atrophy, central thickening/pigmented disciform scar, +PED, punctate IRH superior macula within atrophy, persistent, trace cystic changes overlying central scar - stable Blunted foveal reflex, central thickening, RPE clumping and atrophy, Drusen, trace cystic changes overlying PED - persistent, no heme   Vessels Vascular attenuation, Tortuous Vascular attenuation, Tortuous   Periphery Attached, mild Reticular degeneration Attached, mild Reticular degeneration           Refraction     Wearing Rx       Sphere Cylinder Add   Right [place] Sphere +3.50   Left -0.50 Sphere +3.50    Type: PAL           IMAGING AND PROCEDURES  Imaging and Procedures for 03/21/18  OCT, Retina - OU - Both Eyes       Right Eye Quality was good. Central Foveal Thickness: 488. Progression has been stable. Findings include no SRF, abnormal foveal contour, retinal drusen , outer retinal tubulation, subretinal hyper-reflective material, disciform scar, epiretinal membrane, intraretinal fluid, pigment epithelial detachment, outer retinal atrophy (Stable sub-retinal scar, mild persistent cystic changes overlying ).   Left Eye Quality was good. Central Foveal  Thickness: 310. Progression has been stable. Findings include no SRF, abnormal foveal contour, retinal drusen , subretinal hyper-reflective material, intraretinal fluid, pigment epithelial detachment, outer retinal atrophy (Persistent cystic changes overlying PED/SRHM -- no significant improvement).   Notes *Images captured and stored on drive  Diagnosis / Impression:  Exudative ARMD OU OD: Stable sub-retinal scar, mild persistent cystic changes overlying  OS: Persistent cystic changes overlying PED/SRHM -- no significant improvement  Clinical management:  See below  Abbreviations: NFP - Normal foveal profile. CME - cystoid macular edema. PED - pigment epithelial detachment. IRF - intraretinal fluid. SRF - subretinal fluid. EZ - ellipsoid zone. ERM - epiretinal membrane. ORA - outer retinal atrophy. ORT - outer retinal tubulation. SRHM - subretinal hyper-reflective material      Intravitreal Injection, Pharmacologic Agent - OS - Left Eye       Time Out 12/16/2022. 8:44 AM. Confirmed correct patient, procedure, site, and patient consented.   Anesthesia Topical anesthesia was used. Anesthetic medications included Lidocaine 2%, Proparacaine 0.5%.   Procedure Preparation included 5% betadine to ocular surface, eyelid speculum. A (32g) needle was used.   Injection: 6 mg faricimab-svoa 6 MG/0.05ML   Route: Intravitreal, Site: Left Eye   NDC: 56213-086-57, Lot: [id number], Expiration date: 06/03/2024, Waste: 0 mL   Post-op Post injection exam found visual acuity of at least counting fingers. The patient tolerated the procedure well. There were no complications. The patient received written and verbal post procedure care education. Post injection medications were not given.   Notes **SAMPLE MEDICATION ADMINISTERED**  ASSESSMENT/PLAN:    ICD-10-CM   1. Exudative age-related macular degeneration of both eyes with active choroidal neovascularization (HCC)  H35.3231 OCT,  Retina - OU - Both Eyes    Intravitreal Injection, Pharmacologic Agent - OS - Left Eye    faricimab-svoa (VABYSMO) '6mg'$ /0.53m intravitreal injection    2. Posterior vitreous detachment of both eyes  H43.813     3. Essential hypertension  I10     4. Hypertensive retinopathy of both eyes  H35.033     5. Pseudophakia of both eyes  Z96.1     6. Ocular hypertension, bilateral  H40.053     7. Dry eyes  H04.123      1. Exudative age-related macular degeneration, both eyes  - severe exudative disease with very active CNVM OU at presentation in January 2019  - S/P IVA OD #1 (01.04.19), #2 (02.15.19)  - S/P IVA OS #1 (01.18.19), #2 (02.15.19)  - switched to Eylea 3.18.19 due to severity of disease  - S/P IVE OD #1 (03.18.19), #2 (04.17.19), #3 (05.15.19), #4 (10.02.19),  #5 (01.15.20), #6 (06.04.20), #7 (10.29.20), #8 (01.21.21), #9 (05.27.21), #10 (07.22.21) -- injections held due to stable disciform scar  - S/P IVE OS #1 (03.18.19), #2 (04.17.19), #3 (05.15.19), #4 (06.12.19), #5 (07.10.19), #6 (08.07.19), #7 (09.04.19), #8 (10.02.19), #9 (11.06.19), #10 (12.11.19), #11 (01.15.20), #12 (02.20.20), #13 (03.26.20), #14 (04.30.20), #15 (06.04.20), #16 (07.16.20), # 17 (08.20.20), #18 (09.24.20), #19 (10.29.20), #20 (12.03.20), #21 (01.21.21), #22 (03.18.21), #23 (05.27.21), #24 (07.22.21), #25 (11.11.21), #26 (12.09.21), #27 (01.06.22), #28 (2.7.22), #29 (03.24.22), #30 (4.28.22), #31 (05.31.22), #32 (06.30.22), #33 (08.04.22), #34 (09.15.22), #35 (10.27.22), #36 (12.08.22), #37 (01.12.23), #38 (02.16.23), #39 (03.30.23), #40 (05.11.23), #41 (06.22.23), #42 (07.27.23), #43 (09.01.23), #44 (10.06.23), #45 (11.10.23), #46 (12.15.23)  - IVE OS held from 7.22.21 to 11.11.21 due to TIA/stroke on 8.1.21  - OCT OD: Stable sub-retinal scar, mild persistent cystic changes overlying; OS: Persistent cystic changes overlying PED/SRHM  - exam OS shows faint IRH nasal fovea stably resolved, no heme; OD with focal  IRH superior macula within atrophy  - BCVA OD stable at CF 3'; OS 20/80 from 20/70  - discussed IVE resistance and possible benefit of switching medication  - recommend switching to IVV OS #1 (sample) today, 01.12.24 w/ f/u at 4 wks - will cont to hold OD  - Eylea informed consent form re-signed and scanned on 07.27.2023  - Good Days approved 12/05/2020 - 12/04/2022  - will get insurance auth for Vabysmo for next visit  - f/u in 4 weeks, sooner prn for DFE/OCT/possible injection(s)   2. PVD / vitreous syneresis  - Discussed findings and prognosis   - No RT or RD on 360 exam  - Reviewed s/s of RT/RD  - strict return precautions for any such RT/RD symptoms  3,4. Hypertensive retinopathy OU - exam shows persistent focal IRH OD within GA - discussed importance of tight BP control - monitor  5. Pseudophakia OU  - s/p CE/IOL (Dr. WMarisa Hua 11.2020)  - beautiful surgeries w/ IOLs in excellent position, doing well  - monitor  6. Ocular Hypertension OU  - IOP 18 OU  - continue cosopt BID OU  7. Dry eyes OU - recommend artificial tears and lubricating ointment as needed  Ophthalmic Meds Ordered this visit:  Meds ordered this encounter  Medications   faricimab-svoa (VABYSMO) '6mg'$ /0.070mintravitreal injection     Return in about 4 weeks (around 01/13/2023) for f/u exu ARMD OU, DFE, OCT.  There are no  Patient Instructions on file for this visit.  This document serves as a record of services personally performed by Gardiner Sleeper, MD, PhD. It was created on their behalf by San Jetty. Owens Shark, OA an ophthalmic technician. The creation of this record is the provider's dictation and/or activities during the visit.    Electronically signed by: San Jetty. Owens Shark, [place] 12.13.2023 12:07 PM  This document serves as a record of services personally performed by Gardiner Sleeper, MD, PhD. It was created on their behalf by San Jetty. Owens Shark, OA an ophthalmic technician. The creation of this record is the  provider's dictation and/or activities during the visit.    Electronically signed by: San Jetty. Marguerita Merles 01.12.2024 12:07 PM  Gardiner Sleeper, M.D., Ph.D. Diseases & Surgery of the Retina and Vitreous [facility]  I have reviewed the above documentation for accuracy and completeness, and I agree with the above. Gardiner Sleeper, M.D., Ph.D. 12/16/22 12:09 PM  Abbreviations: M myopia (nearsighted); A astigmatism; H hyperopia (farsighted); P presbyopia; Mrx spectacle prescription;  CTL contact lenses; OD right eye; OS left eye; OU both eyes  XT exotropia; ET esotropia; PEK punctate epithelial keratitis; PEE punctate epithelial erosions; DES dry eye syndrome; MGD meibomian gland dysfunction; ATs artificial tears; PFAT's preservative free artificial tears; [place] nuclear sclerotic cataract; PSC posterior subcapsular cataract; ERM epi-retinal membrane; PVD posterior vitreous detachment; RD retinal detachment; DM diabetes mellitus; DR diabetic retinopathy; NPDR non-proliferative diabetic retinopathy; PDR proliferative diabetic retinopathy; CSME clinically significant macular edema; DME diabetic macular edema; dbh dot blot hemorrhages; CWS cotton wool spot; POAG primary open angle glaucoma; C/D cup-to-disc ratio; HVF humphrey visual field; GVF goldmann visual field; OCT optical coherence tomography; IOP intraocular pressure; BRVO Branch retinal vein occlusion; CRVO central retinal vein occlusion; CRAO central retinal artery occlusion; BRAO branch retinal artery occlusion; RT retinal tear; SB scleral buckle; PPV pars plana vitrectomy; VH Vitreous hemorrhage; PRP panretinal laser photocoagulation; IVK intravitreal kenalog; VMT vitreomacular traction; MH Macular hole;  NVD neovascularization of the disc; NVE neovascularization elsewhere; AREDS age related eye disease study; ARMD age related macular degeneration; POAG primary open angle glaucoma; EBMD epithelial/anterior basement membrane  dystrophy; ACIOL anterior chamber intraocular lens; IOL intraocular lens; PCIOL posterior chamber intraocular lens; Phaco/IOL phacoemulsification with intraocular lens placement; [place] photorefractive keratectomy; LASIK laser assisted in situ keratomileusis; HTN hypertension; DM diabetes mellitus; COPD chronic obstructive pulmonary disease

## 2022-12-16 ENCOUNTER — Ambulatory Visit (INDEPENDENT_AMBULATORY_CARE_PROVIDER_SITE_OTHER): Payer: Medicare HMO | Admitting: Ophthalmology

## 2022-12-16 ENCOUNTER — Encounter (INDEPENDENT_AMBULATORY_CARE_PROVIDER_SITE_OTHER): Payer: Self-pay | Admitting: Ophthalmology

## 2022-12-16 DIAGNOSIS — Z961 Presence of intraocular lens: Secondary | ICD-10-CM | POA: Diagnosis not present

## 2022-12-16 DIAGNOSIS — H353231 Exudative age-related macular degeneration, bilateral, with active choroidal neovascularization: Secondary | ICD-10-CM

## 2022-12-16 DIAGNOSIS — H35033 Hypertensive retinopathy, bilateral: Secondary | ICD-10-CM

## 2022-12-16 DIAGNOSIS — H04123 Dry eye syndrome of bilateral lacrimal glands: Secondary | ICD-10-CM | POA: Diagnosis not present

## 2022-12-16 DIAGNOSIS — I10 Essential (primary) hypertension: Secondary | ICD-10-CM

## 2022-12-16 DIAGNOSIS — H43813 Vitreous degeneration, bilateral: Secondary | ICD-10-CM | POA: Diagnosis not present

## 2022-12-16 DIAGNOSIS — H40053 Ocular hypertension, bilateral: Secondary | ICD-10-CM

## 2022-12-16 MED ORDER — FARICIMAB-SVOA 6 MG/0.05ML IZ SOLN
6.0000 mg | INTRAVITREAL | Status: AC | PRN
  Administered 2022-12-16: 6 mg via INTRAVITREAL

## 2022-12-27 ENCOUNTER — Other Ambulatory Visit: Payer: Self-pay

## 2022-12-27 DIAGNOSIS — C189 Malignant neoplasm of colon, unspecified: Secondary | ICD-10-CM

## 2022-12-28 ENCOUNTER — Inpatient Hospital Stay: Payer: Medicare HMO

## 2022-12-28 VITALS — BP 127/68 | HR 64 | Temp 97.6°F | Resp 18

## 2022-12-28 DIAGNOSIS — C182 Malignant neoplasm of ascending colon: Secondary | ICD-10-CM | POA: Diagnosis not present

## 2022-12-28 DIAGNOSIS — C189 Malignant neoplasm of colon, unspecified: Secondary | ICD-10-CM

## 2022-12-28 DIAGNOSIS — Z79899 Other long term (current) drug therapy: Secondary | ICD-10-CM | POA: Diagnosis not present

## 2022-12-28 DIAGNOSIS — Z5112 Encounter for antineoplastic immunotherapy: Secondary | ICD-10-CM | POA: Diagnosis not present

## 2022-12-28 LAB — CBC WITH DIFFERENTIAL/PLATELET
Abs Immature Granulocytes: 0.02 10*3/uL (ref 0.00–0.07)
Basophils Absolute: 0 10*3/uL (ref 0.0–0.1)
Basophils Relative: 1 %
Eosinophils Absolute: 0.3 10*3/uL (ref 0.0–0.5)
Eosinophils Relative: 6 %
HCT: 35.1 % — ABNORMAL LOW (ref 36.0–46.0)
Hemoglobin: 11.7 g/dL — ABNORMAL LOW (ref 12.0–15.0)
Immature Granulocytes: 0 %
Lymphocytes Relative: 23 %
Lymphs Abs: 1.2 10*3/uL (ref 0.7–4.0)
MCH: 31.9 pg (ref 26.0–34.0)
MCHC: 33.3 g/dL (ref 30.0–36.0)
MCV: 95.6 fL (ref 80.0–100.0)
Monocytes Absolute: 0.5 10*3/uL (ref 0.1–1.0)
Monocytes Relative: 10 %
Neutro Abs: 3.3 10*3/uL (ref 1.7–7.7)
Neutrophils Relative %: 60 %
Platelets: 143 10*3/uL — ABNORMAL LOW (ref 150–400)
RBC: 3.67 MIL/uL — ABNORMAL LOW (ref 3.87–5.11)
RDW: 13.2 % (ref 11.5–15.5)
WBC: 5.4 10*3/uL (ref 4.0–10.5)
nRBC: 0 % (ref 0.0–0.2)

## 2022-12-28 LAB — COMPREHENSIVE METABOLIC PANEL
ALT: 17 U/L (ref 0–44)
AST: 21 U/L (ref 15–41)
Albumin: 4.1 g/dL (ref 3.5–5.0)
Alkaline Phosphatase: 61 U/L (ref 38–126)
Anion gap: 10 (ref 5–15)
BUN: 35 mg/dL — ABNORMAL HIGH (ref 8–23)
CO2: 24 mmol/L (ref 22–32)
Calcium: 9.7 mg/dL (ref 8.9–10.3)
Chloride: 103 mmol/L (ref 98–111)
Creatinine, Ser: 1.62 mg/dL — ABNORMAL HIGH (ref 0.44–1.00)
GFR, Estimated: 33 mL/min — ABNORMAL LOW (ref >60)
Glucose, Bld: 94 mg/dL (ref 70–99)
Potassium: 4.9 mmol/L (ref 3.5–5.1)
Sodium: 137 mmol/L (ref 135–145)
Total Bilirubin: 0.4 mg/dL (ref 0.3–1.2)
Total Protein: 7.5 g/dL (ref 6.5–8.1)

## 2022-12-28 LAB — MAGNESIUM: Magnesium: 2 mg/dL (ref 1.7–2.4)

## 2022-12-28 MED ORDER — SODIUM CHLORIDE 0.9 % IV SOLN
200.0000 mg | Freq: Once | INTRAVENOUS | Status: AC
  Administered 2022-12-28: 200 mg via INTRAVENOUS
  Filled 2022-12-28: qty 8

## 2022-12-28 MED ORDER — HEPARIN SOD (PORK) LOCK FLUSH 100 UNIT/ML IV SOLN
500.0000 [IU] | Freq: Once | INTRAVENOUS | Status: AC | PRN
  Administered 2022-12-28: 500 [IU]

## 2022-12-28 MED ORDER — SODIUM CHLORIDE 0.9 % IV SOLN: Freq: Once | INTRAVENOUS | Status: AC

## 2022-12-28 MED ORDER — SODIUM CHLORIDE 0.9% FLUSH
10.0000 mL | INTRAVENOUS | Status: DC | PRN
  Administered 2022-12-28: 10 mL

## 2022-12-28 NOTE — Patient Instructions (Signed)
[facility]  Discharge Instructions: Thank you for choosing [facility] to provide your oncology and hematology care.  If you have a lab appointment with the [facility], please come in thru the Main Entrance and check in at the main information desk.  Wear comfortable clothing and clothing appropriate for easy access to any Portacath or PICC line.   We strive to give you quality time with your provider. You may need to reschedule your appointment if you arrive late (15 or more minutes).  Arriving late affects you and other patients whose appointments are after yours.  Also, if you miss three or more appointments without notifying the office, you may be dismissed from the clinic at the provider's discretion.      For prescription refill requests, have your pharmacy contact our office and allow 72 hours for refills to be completed.    Today you received Keytruda   To help prevent nausea and vomiting after your treatment, we encourage you to take your nausea medication as directed.   Pembrolizumab Injection What is this medication? PEMBROLIZUMAB (PEM broe LIZ ue mab) treats some types of cancer. It works by helping your immune system slow or stop the spread of cancer cells. It is a monoclonal antibody. This medicine may be used for other purposes; ask your health care provider or pharmacist if you have questions. COMMON BRAND NAME(S): Keytruda What should I tell my care team before I take this medication? They need to know if you have any of these conditions: Allogeneic stem cell transplant (uses someone else's stem cells) Autoimmune diseases, such as Crohn disease, ulcerative colitis, lupus History of chest radiation Nervous system problems, such as Guillain-Barre syndrome, myasthenia gravis Organ transplant An unusual or allergic reaction to pembrolizumab, other medications, foods, dyes, or preservatives Pregnant or trying to get  pregnant Breast-feeding How should I use this medication? This medication is injected into a vein. It is given by your care team in a hospital or clinic setting. A special MedGuide will be given to you before each treatment. Be sure to read this information carefully each time. Talk to your care team about the use of this medication in children. While it may be prescribed for children as young as 6 months for selected conditions, precautions do apply. Overdosage: If you think you have taken too much of this medicine contact a poison control center or emergency room at once. NOTE: This medicine is only for you. Do not share this medicine with others. What if I miss a dose? Keep appointments for follow-up doses. It is important not to miss your dose. Call your care team if you are unable to keep an appointment. What may interact with this medication? Interactions have not been studied. This list may not describe all possible interactions. Give your health care provider a list of all the medicines, herbs, non-prescription drugs, or dietary supplements you use. Also tell them if you smoke, drink alcohol, or use illegal drugs. Some items may interact with your medicine. What should I watch for while using this medication? Your condition will be monitored carefully while you are receiving this medication. You may need blood work while taking this medication. This medication may cause serious skin reactions. They can happen weeks to months after starting the medication. Contact your care team right away if you notice fevers or flu-like symptoms with a rash. The rash may be red or purple and then turn into blisters or peeling of the  skin. You may also notice a red rash with swelling of the face, lips, or lymph nodes in your neck or under your arms. Tell your care team right away if you have any change in your eyesight. Talk to your care team if you may be pregnant. Serious birth defects can occur if you  take this medication during pregnancy and for 4 months after the last dose. You will need a negative pregnancy test before starting this medication. Contraception is recommended while taking this medication and for 4 months after the last dose. Your care team can help you find the option that works for you. Do not breastfeed while taking this medication and for 4 months after the last dose. What side effects may I notice from receiving this medication? Side effects that you should report to your care team as soon as possible: Allergic reactions--skin rash, itching, hives, swelling of the face, lips, tongue, or throat Dry cough, shortness of breath or trouble breathing Eye pain, redness, irritation, or discharge with blurry or decreased vision Heart muscle inflammation--unusual weakness or fatigue, shortness of breath, chest pain, fast or irregular heartbeat, dizziness, swelling of the ankles, feet, or hands Hormone gland problems--headache, sensitivity to light, unusual weakness or fatigue, dizziness, fast or irregular heartbeat, increased sensitivity to cold or heat, excessive sweating, constipation, hair loss, increased thirst or amount of urine, tremors or shaking, irritability Infusion reactions--chest pain, shortness of breath or trouble breathing, feeling faint or lightheaded Kidney injury (glomerulonephritis)--decrease in the amount of urine, red or dark brown urine, foamy or bubbly urine, swelling of the ankles, hands, or feet Liver injury--right upper belly pain, loss of appetite, nausea, light-colored stool, dark yellow or brown urine, yellowing skin or eyes, unusual weakness or fatigue Pain, tingling, or numbness in the hands or feet, muscle weakness, change in vision, confusion or trouble speaking, loss of balance or coordination, trouble walking, seizures Rash, fever, and swollen lymph nodes Redness, blistering, peeling, or loosening of the skin, including inside the mouth Sudden or  severe stomach pain, bloody diarrhea, fever, nausea, vomiting Side effects that usually do not require medical attention (report to your care team if they continue or are bothersome): Bone, joint, or muscle pain Diarrhea Fatigue Loss of appetite Nausea Skin rash This list may not describe all possible side effects. Call your doctor for medical advice about side effects. You may report side effects to FDA at 1-800-FDA-1088. Where should I keep my medication? This medication is given in a hospital or clinic. It will not be stored at home. NOTE: This sheet is a summary. It may not cover all possible information. If you have questions about this medicine, talk to your doctor, pharmacist, or health care provider.  2023 Elsevier/Gold Standard (2013-08-12 00:00:00)  BELOW ARE SYMPTOMS THAT SHOULD BE REPORTED IMMEDIATELY: *FEVER GREATER THAN 100.4 F (38 C) OR HIGHER *CHILLS OR SWEATING *NAUSEA AND VOMITING THAT IS NOT CONTROLLED WITH YOUR NAUSEA MEDICATION *UNUSUAL SHORTNESS OF BREATH *UNUSUAL BRUISING OR BLEEDING *URINARY PROBLEMS (pain or burning when urinating, or frequent urination) *BOWEL PROBLEMS (unusual diarrhea, constipation, pain near the anus) TENDERNESS IN MOUTH AND THROAT WITH OR WITHOUT PRESENCE OF ULCERS (sore throat, sores in mouth, or a toothache) UNUSUAL RASH, SWELLING OR PAIN  UNUSUAL VAGINAL DISCHARGE OR ITCHING   Items with * indicate a potential emergency and should be followed up as soon as possible or go to the Emergency Department if any problems should occur.  Please show the CHEMOTHERAPY ALERT CARD or IMMUNOTHERAPY ALERT CARD  at check-in to the Emergency Department and triage nurse.  Should you have questions after your visit or need to cancel or reschedule your appointment, please contact [facility] [phone]  and follow the prompts.  Office hours are 8:00 a.m. to 4:30 p.m. Monday - Friday. Please note that voicemails left after 4:00 p.m.  may not be returned until the following business day.  We are closed weekends and major holidays. You have access to a nurse at all times for urgent questions. Please call the main number to the clinic [phone] and follow the prompts.  For any non-urgent questions, you may also contact your provider using MyChart. We now offer e-Visits for anyone 88 and older to request care online for non-urgent symptoms. For details visit [email or web address].   Also download the MyChart app! Go to the app store, search "MyChart", open the app, select [facility], and log in with your MyChart username and password.

## 2022-12-28 NOTE — Progress Notes (Signed)
Pt presents today for Keytruda per provider's order. Vital signs and other labs WNL for treatment. Pt's creatinine is 1.62 today okay to proceed with treatment per Dr.K, pt will receive 580m normal saline over an hour per Dr.K.  KBeryle Flockand 500 mL normal saline over an hour given today per MD orders. Tolerated infusion without adverse affects. Vital signs stable. No complaints at this time. Discharged from clinic ambulatory in stable condition. Alert and oriented x 3. [facility]

## 2023-01-06 ENCOUNTER — Other Ambulatory Visit: Payer: Self-pay | Admitting: Hematology

## 2023-01-06 NOTE — Progress Notes (Deleted)
[facility] Note  01/13/2023    CHIEF COMPLAINT Patient presents for No chief complaint on file.  HISTORY OF PRESENT ILLNESS: Gabrielle [name] is a 77 y.o. female who presents to the clinic today for:    Pt states vision seems stable, BP is doing well  Referring physician: Janora Norlander, DO [place],  [place] [address]  HISTORICAL INFORMATION:   Selected notes from the MEDICAL RECORD NUMBER Referred by Dr. Cristela Blue for concern of ARMD OU   CURRENT MEDICATIONS: Current Outpatient Medications (Ophthalmic Drugs)  Medication Sig   dorzolamide-timolol (COSOPT) 2-0.5 % ophthalmic solution INSTILL ONE DROP IN [facility] EYE TWICE DAILY   No current facility-administered medications for this visit. (Ophthalmic Drugs)   Current Outpatient Medications (Other)  Medication Sig   alendronate (FOSAMAX) 70 MG tablet TAKE ONE TABLET EVERY 7 DAYS ON AN EMPTY STOMACH WITH A FULL GLASS OF WATER   amLODipine (NORVASC) 10 MG tablet Take 1 tablet (10 mg total) by mouth daily. For blood pressure   atorvastatin (LIPITOR) 20 MG tablet Take 1 tablet (20 mg total) by mouth daily. For cholesterol   cholecalciferol (VITAMIN D3) 25 MCG (1000 UT) tablet Take 1,000 Units by mouth daily.   CRANBERRY FRUIT PO Take 1 tablet by mouth daily.   diclofenac Sodium (VOLTAREN) 1 % GEL APPLY 4 GRAMS TO AFFECTED AREA(S) 4 TIMES A DAY   ELIQUIS 5 MG TABS tablet TAKE ONE TABLET TWICE DAILY   FEROSUL 325 (65 Fe) MG tablet TAKE  (1)  TABLET TWICE A DAY WITH MEALS (BREAKFAST AND SUPPER)   hydrALAZINE (APRESOLINE) 25 MG tablet Take 1 tablet (25 mg total) by mouth 2 (two) times daily.   losartan-hydrochlorothiazide (HYZAAR) 100-25 MG tablet TAKE 1 TABLET DAILY   potassium chloride SA (KLOR-CON M) 20 MEQ tablet TAKE ONE TABLET THREE TIMES DAILY   spironolactone (ALDACTONE) 25 MG tablet Take 1 tablet (25 mg total) by mouth daily.   No current facility-administered medications for this  visit. (Other)   REVIEW OF SYSTEMS:   ALLERGIES Allergies  Allergen Reactions   Lisinopril Cough   PAST MEDICAL HISTORY Past Medical History:  Diagnosis Date   Arthritis    Cancer [facility])    Cataract    OU   Colon cancer ([facility])    Family history of pancreatic cancer 08/21/2020   Family history of uterine cancer 08/21/2020   H/O cesarean section    Hx of tonsillectomy    Hyperlipidemia    Hypertension    Macular degeneration    Exu ARMD OU   Paroxysmal atrial fibrillation ([place]) 07/05/2020   Past Surgical History:  Procedure Laterality Date   BIOPSY  04/06/2020   Procedure: BIOPSY;  Surgeon: Rogene Houston, MD;  [facility];  Service: Endoscopy;;   CARPAL TUNNEL RELEASE Right    CATARACT EXTRACTION W/PHACO Left 10/11/2019   Procedure: CATARACT EXTRACTION PHACO AND INTRAOCULAR LENS PLACEMENT ([place]);  Surgeon: Baruch Goldmann, MD;  Location: [facility] ORS;  Service: Ophthalmology;  Laterality: Left;  CDE: 8.56   CATARACT EXTRACTION W/PHACO Right 10/25/2019   Procedure: CATARACT EXTRACTION PHACO AND INTRAOCULAR LENS PLACEMENT (I[facility]);  Surgeon: Baruch Goldmann, MD;  Location: [facility];  Service: Ophthalmology;  Laterality: Right;  CDE: 5.67   CESAREAN SECTION     COLONOSCOPY N/A 04/06/2020   Procedure: COLONOSCOPY;  Surgeon: Rogene Houston, MD;  [facility];  Service: Endoscopy;  Laterality: N/A;   CYSTOSCOPY  WITH BIOPSY N/A 04/08/2020   Procedure: CYSTOSCOPY WITH BIOPSY;  Surgeon: Cleon Gustin, MD;  Location: [facility];  Service: Urology;  Laterality: N/A;   ESOPHAGOGASTRODUODENOSCOPY N/A 04/05/2020   Procedure: ESOPHAGOGASTRODUODENOSCOPY (EGD);  Surgeon: Rogene Houston, MD;  [facility];  Service: Endoscopy;  Laterality: N/A;   PARTIAL COLECTOMY N/A 04/08/2020   Procedure: PARTIAL COLECTOMY;  Surgeon: Virl Cagey, MD;  Location: [facility];  Service: General;  Laterality: N/A;   PORTACATH PLACEMENT Left 05/18/2020   Procedure: INSERTION PORT-A-CATH  (ATTACHED CATHETER IN LEFT SUBCLAVIAN);  Surgeon: Virl Cagey, MD;  Location: [facility];  Service: General;  Laterality: Left;   TONSILLECTOMY     FAMILY HISTORY Family History  Problem Relation Age of Onset   Macular degeneration Mother    Stroke Mother    Hypertension Mother    Dementia Mother    Lung cancer Father        dx late 73s; smoking hx   Diabetes Sister    Hypertension Sister    Leukemia Paternal Uncle        d. 68s   Cancer Maternal Aunt        ovarian or endometrial dx 52s   Pancreatic cancer Paternal Uncle        d. late 47s   SOCIAL HISTORY Social History   Tobacco Use   Smoking status: Never   Smokeless tobacco: Never  Vaping Use   Vaping Use: Never used  Substance Use Topics   Alcohol use: No   Drug use: No       OPHTHALMIC EXAM: Not recorded    IMAGING AND PROCEDURES  Imaging and Procedures for 03/21/18          ASSESSMENT/PLAN:  No diagnosis found.  1. Exudative age-related macular degeneration, both eyes  - severe exudative disease with very active CNVM OU at presentation in January 2019  - S/P IVA OD #1 (01.04.19), #2 (02.15.19)  - S/P IVA OS #1 (01.18.19), #2 (02.15.19)  - switched to Eylea 3.18.19 due to severity of disease  - S/P IVE OD #1 (03.18.19), #2 (04.17.19), #3 (05.15.19), #4 (10.02.19),  #5 (01.15.20), #6 (06.04.20), #7 (10.29.20), #8 (01.21.21), #9 (05.27.21), #10 (07.22.21) -- injections held due to stable disciform scar  - S/P IVE OS #1 (03.18.19), #2 (04.17.19), #3 (05.15.19), #4 (06.12.19), #5 (07.10.19), #6 (08.07.19), #7 (09.04.19), #8 (10.02.19), #9 (11.06.19), #10 (12.11.19), #11 (01.15.20), #12 (02.20.20), #13 (03.26.20), #14 (04.30.20), #15 (06.04.20), #16 (07.16.20), # 17 (08.20.20), #18 (09.24.20), #19 (10.29.20), #20 (12.03.20), #21 (01.21.21), #22 (03.18.21), #23 (05.27.21), #24 (07.22.21), #25 (11.11.21), #26 (12.09.21), #27 (01.06.22), #28 (2.7.22), #29 (03.24.22), #30 (4.28.22), #31 (05.31.22), #32  (06.30.22), #33 (08.04.22), #34 (09.15.22), #35 (10.27.22), #36 (12.08.22), #37 (01.12.23), #38 (02.16.23), #39 (03.30.23), #40 (05.11.23), #41 (06.22.23), #42 (07.27.23), #43 (09.01.23), #44 (10.06.23), #45 (11.10.23), #46 (12.15.23)  -IVV OS #1 (12/16/2022) #2 (01/13/2023)  - IVE OS held from 7.22.21 to 11.11.21 due to TIA/stroke on 8.1.21  - OCT OD: Stable sub-retinal scar, mild persistent cystic changes overlying; OS: Persistent cystic changes overlying PED/SRHM  - exam OS shows faint IRH nasal fovea stably resolved, no heme; OD with focal IRH superior macula within atrophy  - BCVA OD stable at CF 3'; OS 20/80 from 20/70  - discussed IVE resistance and possible benefit of switching medication  - recommend switching to IVV OS #2 ( today, 01/13/2023 w/ f/u at 4 wks - will cont to hold OD  - Eylea informed consent form  re-signed and scanned on 07.27.2023  - Good Days approved 12/05/2020 - 12/04/2022  - will get insurance auth for Vabysmo for next visit  -  f/u in 4 weeks, sooner prn for DFE/OCT/possible injection(s)  2. PVD / vitreous syneresis  - Discussed findings and prognosis   - No RT or RD on 360 exam  - Reviewed s/s of RT/RD  - strict return precautions for any such RT/RD symptoms  3,4. Hypertensive retinopathy OU - exam shows persistent focal IRH OD within GA - discussed importance of tight BP control - monitor  5. Pseudophakia OU  - s/p CE/IOL (Dr. Marisa Hua, 11.2020)  - beautiful surgeries w/ IOLs in excellent position, doing well  - monitor  6. Ocular Hypertension OU  - IOP 18 OU  - continue cosopt BID OU  7. Dry eyes OU - recommend artificial tears and lubricating ointment as needed  Ophthalmic Meds Ordered this visit:  No orders of the defined types were placed in this encounter.    No follow-ups on file.  There are no Patient Instructions on file for this visit.  This document serves as a record of services personally performed by Gardiner Sleeper, MD, PhD. It  was created on their behalf by Joetta Manners COT, an ophthalmic technician. The creation of this record is the provider's dictation and/or activities during the visit.    Electronically signed by: Joetta Manners COT 01/06/23 10:36 AM   Gardiner Sleeper, M.D., Ph.D. Diseases & Surgery of the Retina and Vitreous [facility]: M myopia (nearsighted); A astigmatism; H hyperopia (farsighted); P presbyopia; Mrx spectacle prescription;  CTL contact lenses; OD right eye; OS left eye; OU both eyes  XT exotropia; ET esotropia; PEK punctate epithelial keratitis; PEE punctate epithelial erosions; DES dry eye syndrome; MGD meibomian gland dysfunction; ATs artificial tears; PFAT's preservative free artificial tears; [place] nuclear sclerotic cataract; PSC posterior subcapsular cataract; ERM epi-retinal membrane; PVD posterior vitreous detachment; RD retinal detachment; DM diabetes mellitus; DR diabetic retinopathy; NPDR non-proliferative diabetic retinopathy; PDR proliferative diabetic retinopathy; CSME clinically significant macular edema; DME diabetic macular edema; dbh dot blot hemorrhages; CWS cotton wool spot; POAG primary open angle glaucoma; C/D cup-to-disc ratio; HVF humphrey visual field; GVF goldmann visual field; OCT optical coherence tomography; IOP intraocular pressure; BRVO Branch retinal vein occlusion; CRVO central retinal vein occlusion; CRAO central retinal artery occlusion; BRAO branch retinal artery occlusion; RT retinal tear; SB scleral buckle; PPV pars plana vitrectomy; VH Vitreous hemorrhage; PRP panretinal laser photocoagulation; IVK intravitreal kenalog; VMT vitreomacular traction; MH Macular hole;  NVD neovascularization of the disc; NVE neovascularization elsewhere; AREDS age related eye disease study; ARMD age related macular degeneration; POAG primary open angle glaucoma; EBMD epithelial/anterior basement membrane dystrophy; ACIOL anterior chamber  intraocular lens; IOL intraocular lens; PCIOL posterior chamber intraocular lens; Phaco/IOL phacoemulsification with intraocular lens placement; [place] photorefractive keratectomy; LASIK laser assisted in situ keratomileusis; HTN hypertension; DM diabetes mellitus; COPD chronic obstructive pulmonary disease

## 2023-01-06 NOTE — Progress Notes (Incomplete)
[facility] Note  01/13/2023    CHIEF COMPLAINT Patient presents for No chief complaint on file.  HISTORY OF PRESENT ILLNESS: Gabrielle [name] is a 77 y.o. female who presents to the clinic today for:    Pt states vision seems stable, BP is doing well  Referring physician: Janora Norlander, DO [place],  [place] [address]  HISTORICAL INFORMATION:   Selected notes from the MEDICAL RECORD NUMBER Referred by Dr. Cristela Blue for concern of ARMD OU   CURRENT MEDICATIONS: Current Outpatient Medications (Ophthalmic Drugs)  Medication Sig   dorzolamide-timolol (COSOPT) 2-0.5 % ophthalmic solution INSTILL ONE DROP IN [facility] EYE TWICE DAILY   No current facility-administered medications for this visit. (Ophthalmic Drugs)   Current Outpatient Medications (Other)  Medication Sig   alendronate (FOSAMAX) 70 MG tablet TAKE ONE TABLET EVERY 7 DAYS ON AN EMPTY STOMACH WITH A FULL GLASS OF WATER   amLODipine (NORVASC) 10 MG tablet Take 1 tablet (10 mg total) by mouth daily. For blood pressure   atorvastatin (LIPITOR) 20 MG tablet Take 1 tablet (20 mg total) by mouth daily. For cholesterol   cholecalciferol (VITAMIN D3) 25 MCG (1000 UT) tablet Take 1,000 Units by mouth daily.   CRANBERRY FRUIT PO Take 1 tablet by mouth daily.   diclofenac Sodium (VOLTAREN) 1 % GEL APPLY 4 GRAMS TO AFFECTED AREA(S) 4 TIMES A DAY   ELIQUIS 5 MG TABS tablet TAKE ONE TABLET TWICE DAILY   FEROSUL 325 (65 Fe) MG tablet TAKE  (1)  TABLET TWICE A DAY WITH MEALS (BREAKFAST AND SUPPER)   hydrALAZINE (APRESOLINE) 25 MG tablet Take 1 tablet (25 mg total) by mouth 2 (two) times daily.   losartan-hydrochlorothiazide (HYZAAR) 100-25 MG tablet TAKE 1 TABLET DAILY   potassium chloride SA (KLOR-CON M) 20 MEQ tablet TAKE ONE TABLET THREE TIMES DAILY   spironolactone (ALDACTONE) 25 MG tablet Take 1 tablet (25 mg total) by mouth daily.   No current facility-administered medications for this  visit. (Other)   REVIEW OF SYSTEMS:   ALLERGIES Allergies  Allergen Reactions   Lisinopril Cough   PAST MEDICAL HISTORY Past Medical History:  Diagnosis Date   Arthritis    Cancer [facility])    Cataract    OU   Colon cancer ([facility])    Family history of pancreatic cancer 08/21/2020   Family history of uterine cancer 08/21/2020   H/O cesarean section    Hx of tonsillectomy    Hyperlipidemia    Hypertension    Macular degeneration    Exu ARMD OU   Paroxysmal atrial fibrillation ([place]) 07/05/2020   Past Surgical History:  Procedure Laterality Date   BIOPSY  04/06/2020   Procedure: BIOPSY;  Surgeon: Rogene Houston, MD;  [facility];  Service: Endoscopy;;   CARPAL TUNNEL RELEASE Right    CATARACT EXTRACTION W/PHACO Left 10/11/2019   Procedure: CATARACT EXTRACTION PHACO AND INTRAOCULAR LENS PLACEMENT ([place]);  Surgeon: Baruch Goldmann, MD;  Location: [facility];  Service: Ophthalmology;  Laterality: Left;  CDE: 8.56   CATARACT EXTRACTION W/PHACO Right 10/25/2019   Procedure: CATARACT EXTRACTION PHACO AND INTRAOCULAR LENS PLACEMENT (IOC);  Surgeon: Baruch Goldmann, MD;  Location: [facility];  Service: Ophthalmology;  Laterality: Right;  CDE: 5.67   CESAREAN SECTION     COLONOSCOPY N/A 04/06/2020   Procedure: COLONOSCOPY;  Surgeon: Rogene Houston, MD;  [facility];  Service: Endoscopy;  Laterality: N/A;   CYSTOSCOPY  WITH BIOPSY N/A 04/08/2020   Procedure: CYSTOSCOPY WITH BIOPSY;  Surgeon: Cleon Gustin, MD;  Location: [facility];  Service: Urology;  Laterality: N/A;   ESOPHAGOGASTRODUODENOSCOPY N/A 04/05/2020   Procedure: ESOPHAGOGASTRODUODENOSCOPY (EGD);  Surgeon: Rogene Houston, MD;  [facility];  Service: Endoscopy;  Laterality: N/A;   PARTIAL COLECTOMY N/A 04/08/2020   Procedure: PARTIAL COLECTOMY;  Surgeon: Virl Cagey, MD;  Location: [facility];  Service: General;  Laterality: N/A;   PORTACATH PLACEMENT Left 05/18/2020   Procedure: INSERTION PORT-A-CATH  (ATTACHED CATHETER IN LEFT SUBCLAVIAN);  Surgeon: Virl Cagey, MD;  Location: [facility];  Service: General;  Laterality: Left;   TONSILLECTOMY     FAMILY HISTORY Family History  Problem Relation Age of Onset   Macular degeneration Mother    Stroke Mother    Hypertension Mother    Dementia Mother    Lung cancer Father        dx late 61s; smoking hx   Diabetes Sister    Hypertension Sister    Leukemia Paternal Uncle        d. 65s   Cancer Maternal Aunt        ovarian or endometrial dx 20s   Pancreatic cancer Paternal Uncle        d. late 75s   SOCIAL HISTORY Social History   Tobacco Use   Smoking status: Never   Smokeless tobacco: Never  Vaping Use   Vaping Use: Never used  Substance Use Topics   Alcohol use: No   Drug use: No       OPHTHALMIC EXAM: Not recorded    IMAGING AND PROCEDURES  Imaging and Procedures for 03/21/18          ASSESSMENT/PLAN:    ICD-10-CM   1. Exudative age-related macular degeneration of both eyes with active choroidal neovascularization ([place])  H35.3231     2. Posterior vitreous detachment of both eyes  H43.813     3. Essential hypertension  I10     4. Hypertensive retinopathy of both eyes  H35.033     5. Pseudophakia of both eyes  Z96.1     6. Ocular hypertension, bilateral  H40.053     7. Dry eyes  H04.123     8. Combined form of age-related cataract, both eyes  H25.813      1. Exudative age-related macular degeneration, both eyes  - severe exudative disease with very active CNVM OU at presentation in January 2019  - S/P IVA OD #1 (01.04.19), #2 (02.15.19)  - S/P IVA OS #1 (01.18.19), #2 (02.15.19)  - switched to Eylea 3.18.19 due to severity of disease  - S/P IVE OD #1 (03.18.19), #2 (04.17.19), #3 (05.15.19), #4 (10.02.19),  #5 (01.15.20), #6 (06.04.20), #7 (10.29.20), #8 (01.21.21), #9 (05.27.21), #10 (07.22.21) -- injections held due to stable disciform scar  - S/P IVE OS #1 (03.18.19), #2 (04.17.19), #3  (05.15.19), #4 (06.12.19), #5 (07.10.19), #6 (08.07.19), #7 (09.04.19), #8 (10.02.19), #9 (11.06.19), #10 (12.11.19), #11 (01.15.20), #12 (02.20.20), #13 (03.26.20), #14 (04.30.20), #15 (06.04.20), #16 (07.16.20), # 17 (08.20.20), #18 (09.24.20), #19 (10.29.20), #20 (12.03.20), #21 (01.21.21), #22 (03.18.21), #23 (05.27.21), #24 (07.22.21), #25 (11.11.21), #26 (12.09.21), #27 (01.06.22), #28 (2.7.22), #29 (03.24.22), #30 (4.28.22), #31 (05.31.22), #32 (06.30.22), #33 (08.04.22), #34 (09.15.22), #35 (10.27.22), #36 (12.08.22), #37 (01.12.23), #38 (02.16.23), #39 (03.30.23), #40 (05.11.23), #41 (06.22.23), #42 (07.27.23), #43 (09.01.23), #44 (10.06.23), #45 (11.10.23), #46 (12.15.23)  - IVE OS held from 7.22.21 to 11.11.21 due to TIA/stroke  on 8.1.21  - OCT OD: Stable sub-retinal scar, mild persistent cystic changes overlying; OS: Persistent cystic changes overlying PED/SRHM  - exam OS shows faint IRH nasal fovea stably resolved, no heme; OD with focal IRH superior macula within atrophy  - BCVA OD stable at CF 3'; OS 20/80 from 20/70  - discussed IVE resistance and possible benefit of switching medication  - recommend switching to IVV OS #1 (sample) today, 01.12.24 w/ f/u at 4 wks - will cont to hold OD  - Eylea informed consent form re-signed and scanned on 07.27.2023  - Good Days approved 12/05/2020 - 12/04/2022  - will get insurance auth for Vabysmo for next visit  - f/u in 4 weeks, sooner prn for DFE/OCT/possible injection(s)   2. PVD / vitreous syneresis  - Discussed findings and prognosis   - No RT or RD on 360 exam  - Reviewed s/s of RT/RD  - strict return precautions for any such RT/RD symptoms  3,4. Hypertensive retinopathy OU - exam shows persistent focal IRH OD within GA - discussed importance of tight BP control - monitor  5. Pseudophakia OU  - s/p CE/IOL (Dr. Marisa Hua, 11.2020)  - beautiful surgeries w/ IOLs in excellent position, doing well  - monitor  6. Ocular Hypertension  OU  - IOP 18 OU  - continue cosopt BID OU  7. Dry eyes OU - recommend artificial tears and lubricating ointment as needed  Ophthalmic Meds Ordered this visit:  No orders of the defined types were placed in this encounter.    No follow-ups on file.  There are no Patient Instructions on file for this visit.  This document serves as a record of services personally performed by Gardiner Sleeper, MD, PhD. It was created on their behalf by [place]. Owens Shark, OA an ophthalmic technician. The creation of this record is the provider's dictation and/or activities during the visit.    This document serves as a record of services personally performed by Gardiner Sleeper, MD, PhD. It was created on their behalf by Joetta Manners COT, an ophthalmic technician. The creation of this record is the provider's dictation and/or activities during the visit.    Electronically signed by: Joetta Manners COT 01/06/2409:47 AM   Abbreviations: M myopia (nearsighted); A astigmatism; H hyperopia (farsighted); P presbyopia; Mrx spectacle prescription;  CTL contact lenses; OD right eye; OS left eye; OU both eyes  XT exotropia; ET esotropia; PEK punctate epithelial keratitis; PEE punctate epithelial erosions; DES dry eye syndrome; MGD meibomian gland dysfunction; ATs artificial tears; PFAT's preservative free artificial tears; [place] nuclear sclerotic cataract; PSC posterior subcapsular cataract; ERM epi-retinal membrane; PVD posterior vitreous detachment; RD retinal detachment; DM diabetes mellitus; DR diabetic retinopathy; NPDR non-proliferative diabetic retinopathy; PDR proliferative diabetic retinopathy; CSME clinically significant macular edema; DME diabetic macular edema; dbh dot blot hemorrhages; CWS cotton wool spot; POAG primary open angle glaucoma; C/D cup-to-disc ratio; HVF humphrey visual field; GVF goldmann visual field; OCT optical coherence tomography; IOP intraocular pressure; BRVO Branch retinal vein  occlusion; CRVO central retinal vein occlusion; CRAO central retinal artery occlusion; BRAO branch retinal artery occlusion; RT retinal tear; SB scleral buckle; PPV pars plana vitrectomy; VH Vitreous hemorrhage; PRP panretinal laser photocoagulation; IVK intravitreal kenalog; VMT vitreomacular traction; MH Macular hole;  NVD neovascularization of the disc; NVE neovascularization elsewhere; AREDS age related eye disease study; ARMD age related macular degeneration; POAG primary open angle glaucoma; EBMD epithelial/anterior basement membrane dystrophy; ACIOL anterior chamber intraocular lens; IOL intraocular lens; PCIOL posterior  chamber intraocular lens; Phaco/IOL phacoemulsification with intraocular lens placement; [place] photorefractive keratectomy; LASIK laser assisted in situ keratomileusis; HTN hypertension; DM diabetes mellitus; COPD chronic obstructive pulmonary disease

## 2023-01-11 ENCOUNTER — Ambulatory Visit (HOSPITAL_COMMUNITY)
Admission: RE | Admit: 2023-01-11 | Discharge: 2023-01-11 | Disposition: A | Discharge status: Home / Self Care | Payer: Medicare HMO | Source: Ambulatory Visit | Attending: Hematology | Admitting: Hematology

## 2023-01-11 DIAGNOSIS — R918 Other nonspecific abnormal finding of lung field: Secondary | ICD-10-CM | POA: Diagnosis not present

## 2023-01-11 DIAGNOSIS — C189 Malignant neoplasm of colon, unspecified: Secondary | ICD-10-CM | POA: Insufficient documentation

## 2023-01-11 DIAGNOSIS — C19 Malignant neoplasm of rectosigmoid junction: Secondary | ICD-10-CM | POA: Diagnosis not present

## 2023-01-11 DIAGNOSIS — D734 Cyst of spleen: Secondary | ICD-10-CM | POA: Diagnosis not present

## 2023-01-11 DIAGNOSIS — R188 Other ascites: Secondary | ICD-10-CM | POA: Diagnosis not present

## 2023-01-11 DIAGNOSIS — I700 Atherosclerosis of aorta: Secondary | ICD-10-CM | POA: Diagnosis not present

## 2023-01-11 DIAGNOSIS — D7389 Other diseases of spleen: Secondary | ICD-10-CM | POA: Diagnosis not present

## 2023-01-11 MED ORDER — IOHEXOL 300 MG/ML  SOLN
100.0000 mL | Freq: Once | INTRAMUSCULAR | Status: AC | PRN
  Administered 2023-01-11: 80 mL via INTRAVENOUS

## 2023-01-11 MED ORDER — IOHEXOL 9 MG/ML PO SOLN
ORAL | Status: AC
  Filled 2023-01-11: qty 1000

## 2023-01-13 ENCOUNTER — Encounter (INDEPENDENT_AMBULATORY_CARE_PROVIDER_SITE_OTHER): Payer: Self-pay | Admitting: Ophthalmology

## 2023-01-13 ENCOUNTER — Ambulatory Visit (INDEPENDENT_AMBULATORY_CARE_PROVIDER_SITE_OTHER): Payer: Medicare HMO | Admitting: Ophthalmology

## 2023-01-13 DIAGNOSIS — H04123 Dry eye syndrome of bilateral lacrimal glands: Secondary | ICD-10-CM

## 2023-01-13 DIAGNOSIS — I10 Essential (primary) hypertension: Secondary | ICD-10-CM

## 2023-01-13 DIAGNOSIS — H353231 Exudative age-related macular degeneration, bilateral, with active choroidal neovascularization: Secondary | ICD-10-CM | POA: Diagnosis not present

## 2023-01-13 DIAGNOSIS — H40053 Ocular hypertension, bilateral: Secondary | ICD-10-CM

## 2023-01-13 DIAGNOSIS — Z961 Presence of intraocular lens: Secondary | ICD-10-CM | POA: Diagnosis not present

## 2023-01-13 DIAGNOSIS — H43813 Vitreous degeneration, bilateral: Secondary | ICD-10-CM | POA: Diagnosis not present

## 2023-01-13 DIAGNOSIS — H25813 Combined forms of age-related cataract, bilateral: Secondary | ICD-10-CM

## 2023-01-13 DIAGNOSIS — H35033 Hypertensive retinopathy, bilateral: Secondary | ICD-10-CM | POA: Diagnosis not present

## 2023-01-13 MED ORDER — FARICIMAB-SVOA 6 MG/0.05ML IZ SOLN
6.0000 mg | INTRAVITREAL | Status: AC | PRN
  Administered 2023-01-13: 6 mg via INTRAVITREAL

## 2023-01-13 NOTE — Progress Notes (Signed)
[facility] Note  01/13/2023    CHIEF COMPLAINT Patient presents for Retina Follow Up  HISTORY OF PRESENT ILLNESS: Gabrielle [name] is a 77 y.o. female who presents to the clinic today for:   HPI     Retina Follow Up   Patient presents with  Wet AMD.  In both eyes.  This started 4 weeks ago.  Duration of 4 weeks.  Since onset it is stable.  I, the attending physician,  performed the HPI with the patient and updated documentation appropriately.        Comments   4 week retina follow up ARMD OU and I'VE OS pt is reporting vision seems stable since her last visit       Last edited by Bernarda Caffey, MD on 01/13/2023  8:10 AM.    Pt states vision seems stable  Referring physician: Janora Norlander, [place],  [place] [id number]  HISTORICAL INFORMATION:   Selected notes from the MEDICAL RECORD NUMBER Referred by Dr. Cristela Blue for concern of ARMD OU   CURRENT MEDICATIONS: Current Outpatient Medications (Ophthalmic Drugs)  Medication Sig   dorzolamide-timolol (COSOPT) 2-0.5 % ophthalmic solution INSTILL ONE DROP IN [facility] EYE TWICE DAILY   No current facility-administered medications for this visit. (Ophthalmic Drugs)   Current Outpatient Medications (Other)  Medication Sig   alendronate (FOSAMAX) 70 MG tablet TAKE ONE TABLET EVERY 7 DAYS ON AN EMPTY STOMACH WITH A FULL GLASS OF WATER   amLODipine (NORVASC) 10 MG tablet Take 1 tablet (10 mg total) by mouth daily. For blood pressure   atorvastatin (LIPITOR) 20 MG tablet Take 1 tablet (20 mg total) by mouth daily. For cholesterol   cholecalciferol (VITAMIN D3) 25 MCG (1000 UT) tablet Take 1,000 Units by mouth daily.   CRANBERRY FRUIT PO Take 1 tablet by mouth daily.   diclofenac Sodium (VOLTAREN) 1 % GEL APPLY 4 GRAMS TO AFFECTED AREA(S) 4 TIMES A DAY   ELIQUIS 5 MG TABS tablet TAKE ONE TABLET TWICE DAILY   FEROSUL 325 (65 Fe) MG tablet TAKE  (1)  TABLET TWICE A DAY WITH MEALS (BREAKFAST  AND SUPPER)   hydrALAZINE (APRESOLINE) 25 MG tablet Take 1 tablet (25 mg total) by mouth 2 (two) times daily.   losartan-hydrochlorothiazide (HYZAAR) 100-25 MG tablet TAKE 1 TABLET DAILY   potassium chloride SA (KLOR-CON M) 20 MEQ tablet TAKE ONE TABLET THREE TIMES DAILY   spironolactone (ALDACTONE) 25 MG tablet Take 1 tablet (25 mg total) by mouth daily.   No current facility-administered medications for this visit. (Other)   REVIEW OF SYSTEMS: ROS   Positive for: Eyes Last edited by Parthenia Ames, COT on 01/13/2023  7:58 AM.     ALLERGIES Allergies  Allergen Reactions   Lisinopril Cough   PAST MEDICAL HISTORY Past Medical History:  Diagnosis Date   Arthritis    Cancer [facility])    Cataract    OU   Colon cancer ([place])    Family history of pancreatic cancer 08/21/2020   Family history of uterine cancer 08/21/2020   H/O cesarean section    Hx of tonsillectomy    Hyperlipidemia    Hypertension    Macular degeneration    Exu ARMD OU   Paroxysmal atrial fibrillation ([place]) 07/05/2020   Past Surgical History:  Procedure Laterality Date   BIOPSY  04/06/2020   Procedure: BIOPSY;  Surgeon: Rogene Houston, MD;  Location: [facility];  Service: Endoscopy;;   CARPAL TUNNEL RELEASE Right    CATARACT EXTRACTION W/PHACO Left 10/11/2019   Procedure: CATARACT EXTRACTION PHACO AND INTRAOCULAR LENS PLACEMENT ([facility]);  Surgeon: Baruch Goldmann, MD;  Location: [facility] ORS;  Service: Ophthalmology;  Laterality: Left;  CDE: 8.56   CATARACT EXTRACTION W/PHACO Right 10/25/2019   Procedure: CATARACT EXTRACTION PHACO AND INTRAOCULAR LENS PLACEMENT (I[facility]);  Surgeon: Baruch Goldmann, MD;  Location: [facility] ORS;  Service: Ophthalmology;  Laterality: Right;  CDE: 5.67   CESAREAN SECTION     COLONOSCOPY N/A 04/06/2020   Procedure: COLONOSCOPY;  Surgeon: Rogene Houston, MD;  Location: [facility];  Service: Endoscopy;  Laterality: N/A;   CYSTOSCOPY WITH BIOPSY N/A 04/08/2020   Procedure: CYSTOSCOPY WITH BIOPSY;   Surgeon: Cleon Gustin, MD;  Location: [facility] OR[facility];  Service: Urology;  Laterality: N/A;   ESOPHAGOGASTRODUODENOSCOPY N/A 04/05/2020   Procedure: ESOPHAGOGASTRODUODENOSCOPY (EGD);  Surgeon: Rogene Houston, MD;  [facility];  Service: Endoscopy;  Laterality: N/A;   PARTIAL COLECTOMY N/A 04/08/2020   Procedure: PARTIAL COLECTOMY;  Surgeon: Virl Cagey, MD;  Location: [facility];  Service: General;  Laterality: N/A;   PORTACATH PLACEMENT Left 05/18/2020   Procedure: INSERTION PORT-A-CATH (ATTACHED CATHETER IN LEFT SUBCLAVIAN);  Surgeon: Virl Cagey, MD;  Location: [facility];  Service: General;  Laterality: Left;   TONSILLECTOMY     FAMILY HISTORY Family History  Problem Relation Age of Onset   Macular degeneration Mother    Stroke Mother    Hypertension Mother    Dementia Mother    Lung cancer Father        dx late 47s; smoking hx   Diabetes Sister    Hypertension Sister    Leukemia Paternal Uncle        d. 4s   Cancer Maternal Aunt        ovarian or endometrial dx 75s   Pancreatic cancer Paternal Uncle        d. late 40s   SOCIAL HISTORY Social History   Tobacco Use   Smoking status: Never   Smokeless tobacco: Never  Vaping Use   Vaping Use: Never used  Substance Use Topics   Alcohol use: No   Drug use: No       OPHTHALMIC EXAM: Base Eye Exam     Visual Acuity (Snellen - Linear)       Right Left   Dist cc CF at 3' 20/70 -3   Dist ph cc NI NI    Correction: Glasses         Tonometry (Tonopen, 8:01 AM)       Right Left   Pressure 16 13         Pupils       Pupils Dark Light Shape React APD   Right PERRL 3 2 Round Brisk None   Left PERRL 3 2 Round Brisk None         Visual Fields       Left Right    Full Full         Extraocular Movement       Right Left    Full, Ortho Full, Ortho         Neuro/Psych     Oriented x3: Yes   Mood/Affect: Normal         Dilation     Both eyes: 2.5% Phenylephrine @ 8:01 AM            Slit Lamp and Fundus  Exam     Slit Lamp Exam       Right Left   Lids/Lashes Dermatochalasis - upper lid Dermatochalasis - upper lid   Conjunctiva/Sclera 1+ Injection Trace Injection   Cornea Arcus, 3+ fine Punctpate epithelial erosions Arcus, 3+ fine Punctate epithelial erosions, mild tear film debris   Anterior Chamber Deep and quiet Deep and quiet   Iris Round and well dilated Round and well dilated   Lens PCIOL in good position, 1+ Posterior capsular opacification PC IOL in good position with open PC   Anterior Vitreous Vitreous syneresis, Posterior vitreous detachment, vitreous condensations Vitreous syneresis, Posterior vitreous detachment, silicone oil micro drops         Fundus Exam       Right Left   Disc pink and sharp, compact mild pallor, sharp, Compact, vascular loops superiorly   C/D Ratio 0.2 0.2   Macula Blunted foveal reflex, Drusen, Pigment clumping and atrophy, central thickening/pigmented disciform scar, +PED, punctate IRH superior macula within atrophy, persistent, trace cystic changes overlying central scar - stable Blunted foveal reflex, central thickening, RPE clumping and atrophy, Drusen, trace cystic changes overlying PED - persistent, no heme   Vessels Vascular attenuation, Tortuous Vascular attenuation, Tortuous   Periphery Attached, mild Reticular degeneration Attached, mild Reticular degeneration           Refraction     Wearing Rx       Sphere Cylinder Add   Right [place] Sphere +3.50   Left -0.50 Sphere +3.50    Type: PAL           IMAGING AND PROCEDURES  Imaging and Procedures for 03/21/18  OCT, Retina - OU - Both Eyes       Right Eye Quality was good. Central Foveal Thickness: 532. Progression has been stable. Findings include no SRF, abnormal foveal contour, retinal drusen , outer retinal tubulation, subretinal hyper-reflective material, disciform scar, epiretinal membrane, intraretinal fluid, pigment epithelial  detachment, outer retinal atrophy (Stable sub-retinal scar, mild persistent cystic changes overlying -- slightly improved).   Left Eye Quality was good. Central Foveal Thickness: 441. Progression has been stable. Findings include no SRF, abnormal foveal contour, retinal drusen , subretinal hyper-reflective material, intraretinal fluid, pigment epithelial detachment, outer retinal atrophy (Persistent cystic changes overlying PED/SRHM -- no significant improvement).   Notes *Images captured and stored on drive  Diagnosis / Impression:  Exudative ARMD OU OD: Stable sub-retinal scar, mild persistent cystic changes overlying -- slightly improved OS: Persistent cystic changes overlying PED/SRHM -- no significant improvement  Clinical management:  See below  Abbreviations: NFP - Normal foveal profile. CME - cystoid macular edema. PED - pigment epithelial detachment. IRF - intraretinal fluid. SRF - subretinal fluid. EZ - ellipsoid zone. ERM - epiretinal membrane. ORA - outer retinal atrophy. ORT - outer retinal tubulation. SRHM - subretinal hyper-reflective material      Intravitreal Injection, Pharmacologic Agent - OS - Left Eye       Time Out 01/13/2023. 8:30 AM. Confirmed correct patient, procedure, site, and patient consented.   Anesthesia Topical anesthesia was used. Anesthetic medications included Lidocaine 2%, Proparacaine 0.5%.   Procedure Preparation included 5% betadine to ocular surface, eyelid speculum. A (32g) needle was used.   Injection: 6 mg faricimab-svoa 6 MG/0.05ML   Route: Intravitreal, Site: Left Eye   NDC: [id number], Lot: [id number], Expiration date: 01/04/2025, Waste: 0 mL   Post-op Post injection exam found visual acuity of at least counting fingers. The patient tolerated the procedure well. There  were no complications. The patient received written and verbal post procedure care education. Post injection medications were not given.             ASSESSMENT/PLAN:    ICD-10-CM   1. Exudative age-related macular degeneration of both eyes with active choroidal neovascularization (HCC)  H35.3231 OCT, Retina - OU - Both Eyes    Intravitreal Injection, Pharmacologic Agent - OS - Left Eye    faricimab-svoa (VABYSMO) 43m/0.05mL intravitreal injection    CANCELED: Intravitreal Injection, Pharmacologic Agent - OD - Right Eye    2. Posterior vitreous detachment of both eyes  H43.813     3. Essential hypertension  I10     4. Hypertensive retinopathy of both eyes  H35.033     5. Pseudophakia of both eyes  Z96.1     6. Ocular hypertension, bilateral  H40.053     7. Dry eyes  H04.123      1. Exudative age-related macular degeneration, both eyes  - severe exudative disease with very active CNVM OU at presentation in January 2019  - S/P IVA OD #1 (01.04.19), #2 (02.15.19)  - S/P IVA OS #1 (01.18.19), #2 (02.15.19)  - switched to Eylea 3.18.19 due to severity of disease  - S/P IVE OD #1 (03.18.19), #2 (04.17.19), #3 (05.15.19), #4 (10.02.19),  #5 (01.15.20), #6 (06.04.20), #7 (10.29.20), #8 (01.21.21), #9 (05.27.21), #10 (07.22.21) -- injections held due to stable disciform scar  - S/P IVE OS #1 (03.18.19), #2 (04.17.19), #3 (05.15.19), #4 (06.12.19), #5 (07.10.19), #6 (08.07.19), #7 (09.04.19), #8 (10.02.19), #9 (11.06.19), #10 (12.11.19), #11 (01.15.20), #12 (02.20.20), #13 (03.26.20), #14 (04.30.20), #15 (06.04.20), #16 (07.16.20), # 17 (08.20.20), #18 (09.24.20), #19 (10.29.20), #20 (12.03.20), #21 (01.21.21), #22 (03.18.21), #23 (05.27.21), #24 (07.22.21), #25 (11.11.21), #26 (12.09.21), #27 (01.06.22), #28 (2.7.22), #29 (03.24.22), #30 (4.28.22), #31 (05.31.22), #32 (06.30.22), #33 (08.04.22), #34 (09.15.22), #35 (10.27.22), #36 (12.08.22), #37 (01.12.23), #38 (02.16.23), #39 (03.30.23), #40 (05.11.23), #41 (06.22.23), #42 (07.27.23), #43 (09.01.23), #44 (10.06.23), #45 (11.10.23), #46 (12.15.23)  - s/p IVV OS #1 01.12.24 (sample)  - IVE  OS held from 7.22.21 to 11.11.21 due to TIA/stroke on 8.1.21  - OCT OD: Stable sub-retinal scar, mild persistent cystic changes overlying; OS: Persistent cystic changes overlying PED/SRHM at 4 wks  - exam OD with focal IRH superior macula within atrophy  - BCVA OD stable at CF 3'; OS 20/70 (improved)  - recommend IVV OS #2 today, 02.09.24 w/ f/u at 4-5 wks - will cont to hold OD - IVV authorization obtained - RBA of procedure discussed, questions answered - IVV informed consent obtained and signed on 01.12.24 - see procedure note   - Eylea informed consent form re-signed and scanned on 07.27.2023  - Good Days approved 12/05/2020 - 12/04/2022 - f/u in 4-5 weeks, sooner prn for DFE/OCT/possible injection(s)   2. PVD / vitreous syneresis  - Discussed findings and prognosis   - No RT or RD on 360 exam  - Reviewed s/s of RT/RD  - strict return precautions for any such RT/RD symptoms  3,4. Hypertensive retinopathy OU - exam shows persistent focal IRH OD within GA - discussed importance of tight BP control - monitor  5. Pseudophakia OU  - s/p CE/IOL (Dr. WMarisa Hua 11.2020)  - beautiful surgeries w/ IOLs in excellent position, doing well  - monitor  6. Ocular Hypertension OU  - IOP 16,13  - continue cosopt BID OU  7. Dry eyes OU - recommend artificial tears and lubricating ointment as needed  Ophthalmic Meds Ordered this visit:  Meds ordered this encounter  Medications   faricimab-svoa (VABYSMO) 27m/0.05mL intravitreal injection     Return for 4-5 wks - ex ARMD, Dilated Exam, OCT, Possible Injxn.  There are no Patient Instructions on file for this visit.  This document serves as a record of services personally performed by BGardiner Sleeper MD, PhD. It was created on their behalf by JJoetta MannersCOT, an ophthalmic technician. The creation of this record is the provider's dictation and/or activities during the visit.    Electronically signed by: JJoetta MannersCOT  02/02/242:34 AM  This document serves as a record of services personally performed by BGardiner Sleeper MD, PhD. It was created on their behalf by ASan Jetty BOwens Shark OA an ophthalmic technician. The creation of this record is the provider's dictation and/or activities during the visit.    Electronically signed by: ASan Jetty BOwens Shark O[place]02.09.2024 2:34 AM  BGardiner Sleeper M.D., Ph.D. Diseases & Surgery of the Retina and [place]02/08/2023   I have reviewed the above documentation for accuracy and completeness, and I agree with the above. BGardiner Sleeper M.D., Ph.D. 01/14/23 2:43 AM   Abbreviations: M myopia (nearsighted); A astigmatism; H hyperopia (farsighted); P presbyopia; Mrx spectacle prescription;  CTL contact lenses; OD right eye; OS left eye; OU both eyes  XT exotropia; ET esotropia; PEK punctate epithelial keratitis; PEE punctate epithelial erosions; DES dry eye syndrome; MGD meibomian gland dysfunction; ATs artificial tears; PFAT's preservative free artificial tears; [place]nuclear sclerotic cataract; PSC posterior subcapsular cataract; ERM epi-retinal membrane; PVD posterior vitreous detachment; RD retinal detachment; DM diabetes mellitus; DR diabetic retinopathy; NPDR non-proliferative diabetic retinopathy; PDR proliferative diabetic retinopathy; CSME clinically significant macular edema; DME diabetic macular edema; dbh dot blot hemorrhages; CWS cotton wool spot; POAG primary open angle glaucoma; C/D cup-to-disc ratio; HVF humphrey visual field; GVF goldmann visual field; OCT optical coherence tomography; IOP intraocular pressure; BRVO Branch retinal vein occlusion; CRVO central retinal vein occlusion; CRAO central retinal artery occlusion; BRAO branch retinal artery occlusion; RT retinal tear; SB scleral buckle; PPV pars plana vitrectomy; VH Vitreous hemorrhage; PRP panretinal laser photocoagulation; IVK intravitreal kenalog; VMT vitreomacular traction; MH  Macular hole;  NVD neovascularization of the disc; NVE neovascularization elsewhere; AREDS age related eye disease study; ARMD age related macular degeneration; POAG primary open angle glaucoma; EBMD epithelial/anterior basement membrane dystrophy; ACIOL anterior chamber intraocular lens; IOL intraocular lens; PCIOL posterior chamber intraocular lens; Phaco/IOL phacoemulsification with intraocular lens placement; PLaskerphotorefractive keratectomy; LASIK laser assisted in situ keratomileusis; HTN hypertension; DM diabetes mellitus; COPD chronic obstructive pulmonary disease

## 2023-01-17 ENCOUNTER — Other Ambulatory Visit: Payer: Self-pay

## 2023-01-17 DIAGNOSIS — C189 Malignant neoplasm of colon, unspecified: Secondary | ICD-10-CM

## 2023-01-18 ENCOUNTER — Inpatient Hospital Stay: Payer: Medicare HMO | Attending: Hematology | Admitting: Hematology

## 2023-01-18 ENCOUNTER — Inpatient Hospital Stay: Payer: Medicare HMO

## 2023-01-18 VITALS — BP 137/61 | HR 67 | Temp 97.7°F | Resp 18

## 2023-01-18 VITALS — BP 141/67 | HR 67 | Temp 97.5°F | Ht 62.0 in | Wt 173.8 lb

## 2023-01-18 DIAGNOSIS — C189 Malignant neoplasm of colon, unspecified: Secondary | ICD-10-CM

## 2023-01-18 DIAGNOSIS — Z79899 Other long term (current) drug therapy: Secondary | ICD-10-CM | POA: Diagnosis not present

## 2023-01-18 DIAGNOSIS — C182 Malignant neoplasm of ascending colon: Secondary | ICD-10-CM | POA: Insufficient documentation

## 2023-01-18 DIAGNOSIS — Z5112 Encounter for antineoplastic immunotherapy: Secondary | ICD-10-CM | POA: Diagnosis not present

## 2023-01-18 LAB — CBC WITH DIFFERENTIAL/PLATELET
Abs Immature Granulocytes: 0.03 10*3/uL (ref 0.00–0.07)
Basophils Absolute: 0 10*3/uL (ref 0.0–0.1)
Basophils Relative: 1 %
Eosinophils Absolute: 0.3 10*3/uL (ref 0.0–0.5)
Eosinophils Relative: 4 %
HCT: 35.2 % — ABNORMAL LOW (ref 36.0–46.0)
Hemoglobin: 11.5 g/dL — ABNORMAL LOW (ref 12.0–15.0)
Immature Granulocytes: 1 %
Lymphocytes Relative: 19 %
Lymphs Abs: 1.2 10*3/uL (ref 0.7–4.0)
MCH: 31.7 pg (ref 26.0–34.0)
MCHC: 32.7 g/dL (ref 30.0–36.0)
MCV: 97 fL (ref 80.0–100.0)
Monocytes Absolute: 0.6 10*3/uL (ref 0.1–1.0)
Monocytes Relative: 9 %
Neutro Abs: 4.2 10*3/uL (ref 1.7–7.7)
Neutrophils Relative %: 66 %
Platelets: 148 10*3/uL — ABNORMAL LOW (ref 150–400)
RBC: 3.63 MIL/uL — ABNORMAL LOW (ref 3.87–5.11)
RDW: 13.1 % (ref 11.5–15.5)
WBC: 6.3 10*3/uL (ref 4.0–10.5)
nRBC: 0 % (ref 0.0–0.2)

## 2023-01-18 LAB — COMPREHENSIVE METABOLIC PANEL
ALT: 15 U/L (ref 0–44)
AST: 20 U/L (ref 15–41)
Albumin: 4.5 g/dL (ref 3.5–5.0)
Alkaline Phosphatase: 62 U/L (ref 38–126)
Anion gap: 11 (ref 5–15)
BUN: 30 mg/dL — ABNORMAL HIGH (ref 8–23)
CO2: 25 mmol/L (ref 22–32)
Calcium: 10 mg/dL (ref 8.9–10.3)
Chloride: 99 mmol/L (ref 98–111)
Creatinine, Ser: 1.44 mg/dL — ABNORMAL HIGH (ref 0.44–1.00)
GFR, Estimated: 38 mL/min — ABNORMAL LOW (ref >60)
Glucose, Bld: 94 mg/dL (ref 70–99)
Potassium: 4.4 mmol/L (ref 3.5–5.1)
Sodium: 135 mmol/L (ref 135–145)
Total Bilirubin: 0.7 mg/dL (ref 0.3–1.2)
Total Protein: 7.7 g/dL (ref 6.5–8.1)

## 2023-01-18 LAB — TSH: TSH: 2.968 u[IU]/mL (ref 0.350–4.500)

## 2023-01-18 LAB — MAGNESIUM: Magnesium: 2.1 mg/dL (ref 1.7–2.4)

## 2023-01-18 MED ORDER — SODIUM CHLORIDE 0.9 % IV SOLN
200.0000 mg | Freq: Once | INTRAVENOUS | Status: AC
  Administered 2023-01-18: 200 mg via INTRAVENOUS
  Filled 2023-01-18: qty 8

## 2023-01-18 MED ORDER — SODIUM CHLORIDE 0.9 % IV SOLN: Freq: Once | INTRAVENOUS | Status: AC

## 2023-01-18 MED ORDER — SODIUM CHLORIDE 0.9% FLUSH
10.0000 mL | INTRAVENOUS | Status: DC | PRN
  Administered 2023-01-18: 10 mL

## 2023-01-18 MED ORDER — HEPARIN SOD (PORK) LOCK FLUSH 100 UNIT/ML IV SOLN
500.0000 [IU] | Freq: Once | INTRAVENOUS | Status: AC | PRN
  Administered 2023-01-18: 500 [IU]

## 2023-01-18 NOTE — Progress Notes (Signed)
[facility] [address] [place] [address]    Clinic Day:  01/18/2023  Referring physician: Janora Norlander, DO  Patient Care Team: Gabrielle Norlander, DO as PCP - General (Family Medicine) Gabrielle Caffey, MD as Consulting Physician ([facility]) Gabrielle Goldmann, MD as Consulting Physician (Ophthalmology) Gabrielle Binder, MD (Inactive) as Consulting Physician (Gastroenterology) Gabrielle Jack, MD as Medical Oncologist (Oncology)   ASSESSMENT & PLAN:   Assessment : 1.  Stage IIIb Gabrielle [name]) poorly differentiated right colon adenocarcinoma: -Right hemicolectomy on 04/07/2020 with poorly differentiated adenocarcinoma, pT4a, positive radial margin, 1/15 lymph nodes positive, loss of MLH1 and PMS2, bladder biopsy negative for malignancy. -CTAP on 04/06/2020 showed circumferential ascending colon mass with numerous borderline enlarged pericolonic lymph nodes.  No findings of hepatic metastatic disease. -CEA on 04/04/2020 was 12.2.  CEA improved to 1.7 on 05/13/2020. -PET scan on 05/11/2020 shows mild FDG uptake associated with the anastomotic site.  Small pulmonary nodules that do not show elevated FDG activity, remain nonspecific, subcentimeter.  Persistent but improved bladder wall thickening. -Cycle 1 of dose reduced FOLFOX on 05/19/2020, followed by 2 hospitalizations, 1 from acute kidney injury from diarrhea and second admission for C. difficile colitis. -Cycle 2 of 5-FU and leucovorin dose reduced on 06/30/2020.  Chemotherapy discontinued secondary to intolerance. -Follow-up CT scan on 11/02/2020 with multiple soft tissue lesions in the central and right mesentery measuring up to 3.1 x 1.7 cm.  Mild lymphadenopathy in the hepatic duodenal ligament, retroperitoneal space, right common iliac chain, bilateral external iliac chains.  17 mm soft tissue nodule in the lower anterior abdominal wall.  Bilateral tiny pulmonary nodules not substantially changed.  2.9 x 2.5  cm low-density lesion along the posterior uterus is indeterminate. -Pembrolizumab started on 11/10/2020. -CT CAP from 02/09/2021 showed interval near complete resolution of previously demonstrated nodal mass in the ileocolonic mesentery.  Residual ill-defined nodule medial to the tip of the right hepatic lobe.  Stable small pulmonary nodules bilaterally consistent with benign findings.   2.  Family history: -Paternal uncle had colon cancer.  Maternal aunt had brain cancer and another maternal aunt had gynecological malignancy.  Father had lung cancer and was a smoker. - Genetic testing did not reveal any notable mutations.   3.  Diffuse erythema and nodularity of the dome of the bladder: -CT scan showed severely thickened bladder wall. -Cystoscopy on 04/08/2020 showed diffuse erythema, nodularity in the posterior wall tracking to the dome.  Ureteral orifices were in normal locations.  Biopsies were benign.  Plan:  1.  Stage IV right colon adenocarcinoma, MSI-high: - CT CAP (01/11/2023): Reviewed by me shows stable bilateral lung nodules, favor benign.  No evidence of metastatic disease in the chest, abdomen or pelvis. - She reports fecal leakage once in a while, 1-2 times per day when it happens which started about a month ago.  Denies any constipation. - Does not clear of the cause of fecal leakage.  However I have recommended to hold iron tablet.  She is not on any stool softeners or laxatives. - Reviewed labs today which showed creatinine is stable at 1.4.  LFTs are normal.  CBC was grossly normal.  TSH is 2.9. - Proceed with treatment today.  RTC 3 weeks for follow-up.   2.  Paroxysmal atrial fibrillation: - Continue Eliquis indefinitely.  No bleeding issues.   3.  Hypertension: - Continue Hyzaar and amlodipine.  Blood pressure is 140/67.  4.  Hypokalemia: - Continue potassium 20  mEq 3 times daily.  Potassium is normal.  Orders Placed This Encounter  Procedures   Magnesium    Standing  Status:   Future    Standing Expiration Date:   02/08/2024   CBC with Differential    Standing Status:   Future    Standing Expiration Date:   02/09/2024   Comprehensive metabolic panel    Standing Status:   Future    Standing Expiration Date:   02/09/2024   Magnesium    Standing Status:   Future    Standing Expiration Date:   02/29/2024   CBC with Differential    Standing Status:   Future    Standing Expiration Date:   03/01/2024   Comprehensive metabolic panel    Standing Status:   Future    Standing Expiration Date:   03/01/2024   Magnesium    Standing Status:   Future    Standing Expiration Date:   03/21/2024   CBC with Differential    Standing Status:   Future    Standing Expiration Date:   03/22/2024   Comprehensive metabolic panel    Standing Status:   Future    Standing Expiration Date:   03/22/2024   Magnesium    Standing Status:   Future    Standing Expiration Date:   04/11/2024   CBC with Differential    Standing Status:   Future    Standing Expiration Date:   04/12/2024   Comprehensive metabolic panel    Standing Status:   Future    Standing Expiration Date:   04/12/2024   Magnesium    Standing Status:   Future    Standing Expiration Date:   05/02/2024   CBC with Differential    Standing Status:   Future    Standing Expiration Date:   05/03/2024   Comprehensive metabolic panel    Standing Status:   Future    Standing Expiration Date:   05/03/2024      Gabrielle [name],acting as a scribe for Gabrielle Jack, MD.,have documented all relevant documentation on the behalf of Gabrielle Jack, MD,as directed by  Gabrielle Jack, MD while in the presence of Gabrielle Jack, MD.   I, Gabrielle Jack MD, have reviewed the above documentation for accuracy and completeness, and I agree with the above.   Gabrielle [name]   2/14/20242:41 PM  CHIEF COMPLAINT:   Diagnosis: right colon cancer    Cancer Staging  Malignant neoplasm of colon [facility]) Staging form:  Colon and Rectum, AJCC 8th Edition - Clinical stage from 04/27/2020: Stage IIIB (cT4a, cN1a, cM0) - Unsigned - Pathologic stage from 11/05/2020: Stage IVC (rpTX, pN0, pM1c) - Signed by Gabrielle Jack, MD on 11/05/2020    Prior Therapy: Gabrielle [name] every 3 weeks    Current Therapy:  COLORECTAL Pembrolizumab   HISTORY OF PRESENT ILLNESS:   Oncology History  Malignant neoplasm of colon ([place])  04/07/2020 Initial Diagnosis   Malignant neoplasm of ascending colon ([place])   05/13/2020 Genetic Testing   [facility]     05/19/2020 - 07/02/2020 Chemotherapy   The patient had palonosetron (ALOXI) injection 0.25 mg, 0.25 mg, Intravenous,  Once, 2 of 12 cycles Administration: 0.25 mg (05/19/2020), 0.25 mg (06/30/2020) leucovorin 522 mg in dextrose 5 % 250 mL infusion, 320 mg/m2 = 522 mg (80 % of original dose 400 mg/m2), Intravenous,  Once, 2 of 12 cycles Dose modification: 320 mg/m2 (80 % of original dose 400 mg/m2, Cycle 1, Reason: Provider Judgment), [id number] mg/m2 (  66.7 % of original dose 400 mg/m2, Cycle 2, Reason: Provider Judgment) Administration: 522 mg (05/19/2020), 434 mg (06/30/2020) oxaliplatin (ELOXATIN) 110 mg in dextrose 5 % 500 mL chemo infusion, 68 mg/m2 = 110 mg (80 % of original dose 85 mg/m2), Intravenous,  Once, 1 of 1 cycle Dose modification: 68 mg/m2 (80 % of original dose 85 mg/m2, Cycle 1, Reason: Provider Judgment) Administration: 110 mg (05/19/2020) fluorouracil (ADRUCIL) chemo injection 500 mg, 320 mg/m2 = 500 mg (80 % of original dose 400 mg/m2), Intravenous,  Once, 2 of 12 cycles Dose modification: 320 mg/m2 (80 % of original dose 400 mg/m2, Cycle 1, Reason: Provider Judgment), [phone] mg/m2 (66.7 % of original dose 400 mg/m2, Cycle 2, Reason: Provider Judgment) Administration: 500 mg (05/19/2020), 450 mg (06/30/2020) fluorouracil (ADRUCIL) 3,150 mg in sodium chloride 0.9 % 87 mL chemo infusion, 1,920 mg/m2 = 3,150 mg (80 % of original dose 2,400 mg/m2), Intravenous, 1  Day/Dose, 2 of 12 cycles Dose modification: 1,920 mg/m2 (80 % of original dose 2,400 mg/m2, Cycle 1, Reason: Provider Judgment), 1,600 mg/m2 (66.7 % of original dose 2,400 mg/m2, Cycle 2, Reason: Provider Judgment) Administration: 3,150 mg (05/19/2020), 2,600 mg (06/30/2020)  for chemotherapy treatment.    08/03/2020 Genetic Testing   [facility] Reveal Testing     08/14/2020 Genetic Testing   No pathogenic variants detected in [facility] Common Hereditary Cancers Panel.  Variant of uncertain significance (VUS) detected in HOXB13 at c.634G>A (p.Ala212Thr). The Common Hereditary Cancers Panel offered by [facility] includes sequencing and/or deletion duplication testing of the following 48 genes: APC, ATM, AXIN2, BARD1, BMPR1A, BRCA1, BRCA2, BRIP1, CDH1, CDK4, CDKN2A (p14ARF), CDKN2A (p16INK4a), CHEK2, CTNNA1, DICER1, EPCAM (Deletion/duplication testing only), FLCN, GREM1 (promoter region deletion/duplication testing only), KIT, MEN1, MLH1, MSH2, MSH3, MSH6, MUTYH, NBN, NF1, NHTL1, PALB2, PDGFRA, PMS2, POLD1, POLE, PTEN, RAD50, RAD51C, RAD51D, RNF43, SDHB, SDHC, SDHD, SMAD4, SMARCA4. STK11, TP53, TSC1, TSC2, and VHL.  The following genes were evaluated for sequence changes only: SDHA and HOXB13 c.251G>A variant only. The report date is August 14, 2020.    11/05/2020 Cancer Staging   Staging form: Colon and Rectum, AJCC 8th Edition - Pathologic stage from 11/05/2020: Stage IVC (rpTX, pN0, pM1c) - Signed by Gabrielle Jack, MD on 11/05/2020   11/10/2020 - 07/13/2022 Chemotherapy   Patient is on Treatment Plan : COLORECTAL Pembrolizumab q21d     11/10/2020 -  Chemotherapy   Patient is on Treatment Plan : COLORECTAL Pembrolizumab (200) q21d        INTERVAL HISTORY:   Ices is a 77 y.o. female presenting to clinic today for follow up of right colon cancer. She was last seen by me on 08/03/2022.  Today, she states that she is doing well overall. Her appetite level is at 100%. Her energy level is at  100%.She denies any recent problems. She denies taking fiber pills,any constipation, and taking stool softer. She denies change in dieta and medications. She developed arthritis in back 2wks ago.She states that she has unexpected watery diarrhea that started a month ago This typically happen occasionally x2 a day. She denies taking imodium to help with the diarrhea. There is no blood and the fluids do not come from the vagina. She takes potassium supplements x3.  She still takes iron pills x2 a day.   PAST MEDICAL HISTORY:   Past Medical History: Past Medical History:  Diagnosis Date   Arthritis    Cancer [facility])    Cataract    OU   Colon cancer ([place])  Family history of pancreatic cancer 08/21/2020   Family history of uterine cancer 08/21/2020   H/O cesarean section    Hx of tonsillectomy    Hyperlipidemia    Hypertension    Macular degeneration    Exu ARMD OU   Paroxysmal atrial fibrillation ([place]) 07/05/2020    Surgical History: Past Surgical History:  Procedure Laterality Date   BIOPSY  04/06/2020   Procedure: BIOPSY;  Surgeon: Rogene Houston, MD;  Location[facility];  Service: Endoscopy;;   CARPAL TUNNEL RELEASE Right    CATARACT EXTRACTION W/PHACO Left 10/11/2019   Procedure: CATARACT EXTRACTION PHACO AND INTRAOCULAR LENS PLACEMENT ([place]);  Surgeon: Gabrielle Goldmann, MD;  Location: [facility] ORS;  Service: Ophthalmology;  Laterality: Left;  CDE: 8.56   CATARACT EXTRACTION W/PHACO Right 10/25/2019   Procedure: CATARACT EXTRACTION PHACO AND INTRAOCULAR LENS PLACEMENT ([facility]);  Surgeon: Gabrielle Goldmann, MD;  Location: [facility] ORS;  Service: Ophthalmology;  Laterality: Right;  CDE: 5.67   CESAREAN SECTION     COLONOSCOPY N/A 04/06/2020   Procedure: COLONOSCOPY;  Surgeon: Rogene Houston, MD;  Location: [facility];  Service: Endoscopy;  Laterality: N/A;   CYSTOSCOPY WITH BIOPSY N/A 04/08/2020   Procedure: CYSTOSCOPY WITH BIOPSY;  Surgeon: Cleon Gustin, MD;  Location: [facility] ORS;  Service:  Urology;  Laterality: N/A;   ESOPHAGOGASTRODUODENOSCOPY N/A 04/05/2020   Procedure: ESOPHAGOGASTRODUODENOSCOPY (EGD);  Surgeon: Rogene Houston, MD;  Location: [facility];  Service: Endoscopy;  Laterality: N/A;   PARTIAL COLECTOMY N/A 04/08/2020   Procedure: PARTIAL COLECTOMY;  Surgeon: Virl Cagey, MD;  Location: [facility] ORS;  Service: General;  Laterality: N/A;   PORTACATH PLACEMENT Left 05/18/2020   Procedure: INSERTION PORT-A-CATH (ATTACHED CATHETER IN LEFT SUBCLAVIAN);  Surgeon: Virl Cagey, MD;  Location: [facility] OR[facility];  Service: General;  Laterality: Left;   TONSILLECTOMY      Social History: Social History   Socioeconomic History   Marital status: Divorced    Spouse name: Not on file   Number of children: Not on file   Years of education: Not on file   Highest education level: Not on file  Occupational History   Not on file  Tobacco Use   Smoking status: Never   Smokeless tobacco: Never  Vaping Use   Vaping Use: Never used  Substance and Sexual Activity   Alcohol use: No   Drug use: No   Sexual activity: Not Currently  Other Topics Concern   Not on file  Social History Narrative   Not on file   Social Determinants of Health   Financial Resource Strain: Low Risk  (08/31/2022)   Overall Financial Resource Strain (CARDIA)    Difficulty of Paying Living Expenses: Not hard at all  Food Insecurity: No Food Insecurity (02/21/2022)   Hunger Vital Sign    Worried About Running Out of Food in the Last Year: Never true    Ran Out of Food in the Last Year: Never true  Transportation Needs: No Transportation Needs (08/31/2022)   PRAPARE - Hydrologist (Medical): No    Lack of Transportation (Non-Medical): No  Physical Activity: Sufficiently Active (02/21/2022)   Exercise Vital Sign    Days of Exercise per Week: 5 days    Minutes of Exercise per Session: 30 min  Stress: No Stress Concern Present (02/21/2022)   [place]    Feeling of Stress : Not at all  Social Connections:  Moderately Isolated (02/21/2022)   Social Connection and Isolation Panel [NHANES]    Frequency of Communication with Friends and Family: More than three times a week    Frequency of Social Gatherings with Friends and Family: More than three times a week    Attends Religious Services: 1 to 4 times per year    Active Member of [employer] or Organizations: No    Attends Archivist Meetings: Never    Marital Status: Divorced  Human resources officer Violence: Not At Risk (02/21/2022)   Humiliation, Afraid, Rape, and Kick questionnaire    Fear of Current or Ex-Partner: No    Emotionally Abused: No    Physically Abused: No    Sexually Abused: No    Family History: Family History  Problem Relation Age of Onset   Macular degeneration Mother    Stroke Mother    Hypertension Mother    Dementia Mother    Lung cancer Father        dx late 63s; smoking hx   Diabetes Sister    Hypertension Sister    Leukemia Paternal Uncle        d. 25s   Cancer Maternal Aunt        ovarian or endometrial dx 64s   Pancreatic cancer Paternal Uncle        d. late 21s    Current Medications:  Current Outpatient Medications:    alendronate (FOSAMAX) 70 MG tablet, TAKE ONE TABLET EVERY 7 DAYS ON AN EMPTY STOMACH WITH A FULL GLASS OF WATER, Disp: 12 tablet, Rfl: 1   amLODipine (NORVASC) 10 MG tablet, Take 1 tablet (10 mg total) by mouth daily. For blood pressure, Disp: 90 tablet, Rfl: 3   atorvastatin (LIPITOR) 20 MG tablet, Take 1 tablet (20 mg total) by mouth daily. For cholesterol, Disp: 90 tablet, Rfl: 3   cholecalciferol (VITAMIN D3) 25 MCG (1000 UT) tablet, Take 1,000 Units by mouth daily., Disp: , Rfl:    CRANBERRY FRUIT PO, Take 1 tablet by mouth daily., Disp: , Rfl:    diclofenac Sodium (VOLTAREN) 1 % GEL, APPLY 4 GRAMS TO AFFECTED AREA(S) 4 TIMES A DAY, Disp: 400 g, Rfl: 2   dorzolamide-timolol (COSOPT)  2-0.5 % ophthalmic solution, INSTILL ONE DROP IN EACH EYE TWICE DAILY, Disp: 10 mL, Rfl: 10   ELIQUIS 5 MG TABS tablet, TAKE ONE TABLET TWICE DAILY, Disp: 60 tablet, Rfl: 6   FEROSUL 325 (65 Fe) MG tablet, TAKE  (1)  TABLET TWICE A DAY WITH MEALS (BREAKFAST AND SUPPER), Disp: 180 tablet, Rfl: 0   hydrALAZINE (APRESOLINE) 25 MG tablet, Take 1 tablet (25 mg total) by mouth 2 (two) times daily., Disp: 180 tablet, Rfl: 3   losartan-hydrochlorothiazide (HYZAAR) 100-25 MG tablet, TAKE 1 TABLET DAILY, Disp: 90 tablet, Rfl: 3   potassium chloride SA (KLOR-CON M) 20 MEQ tablet, TAKE ONE TABLET THREE TIMES DAILY, Disp: 90 tablet, Rfl: 3   spironolactone (ALDACTONE) 25 MG tablet, Take 1 tablet (25 mg total) by mouth daily., Disp: 90 tablet, Rfl: 3 No current facility-administered medications for this visit.  Facility-Administered Medications Ordered in Other Visits:    heparin lock flush 100 unit/mL, 500 Units, Intracatheter, Once PRN, Gabrielle Jack, MD   pembrolizumab [facility]) 200 mg in sodium chloride 0.9 % 50 mL chemo infusion, 200 mg, Intravenous, Once, Gabrielle Jack, MD, Last Rate: 116 mL/hr at 01/18/23 1438, 200 mg at 01/18/23 1438   sodium chloride flush (NS) 0.9 % injection 10 mL, 10 mL, Intracatheter,  PRN, Gabrielle Jack, MD   Allergies: Allergies  Allergen Reactions   Lisinopril Cough    REVIEW OF SYSTEMS:   Review of Systems  Constitutional:  Negative for chills, fatigue and fever.  HENT:   Negative for lump/mass, mouth sores, nosebleeds, sore throat and trouble swallowing.   Eyes:  Negative for eye problems.  Respiratory:  Negative for cough and shortness of breath.   Cardiovascular:  Negative for chest pain, leg swelling and palpitations.  Gastrointestinal:  Negative for abdominal pain, constipation, diarrhea, nausea and vomiting.  Genitourinary:  Negative for bladder incontinence, difficulty urinating, dysuria, frequency, hematuria and nocturia.    Musculoskeletal:  Negative for arthralgias, back pain, flank pain, myalgias and neck pain.  Skin:  Negative for itching and rash.  Neurological:  Negative for dizziness, headaches and numbness.  Hematological:  Does not bruise/bleed easily.  Psychiatric/Behavioral:  Negative for depression, sleep disturbance and suicidal ideas. The patient is not nervous/anxious.   All other systems reviewed and are negative.    VITALS:   Blood pressure (!) 141/67, pulse 67, temperature (!) 97.5 F (36.4 C), temperature source Oral, height 5' 2"$  (1.575 m), weight 173 lb 12.8 oz (78.8 kg), SpO2 100 %.  Wt Readings from Last 3 Encounters:  01/18/23 173 lb 12.8 oz (78.8 kg)  12/07/22 175 lb 6.4 oz (79.6 kg)  11/16/22 171 lb 12.8 oz (77.9 kg)    Body mass index is 31.79 kg/m.  Performance status (ECOG): 1 - Symptomatic but completely ambulatory  PHYSICAL EXAM:   Physical Exam Vitals and nursing note reviewed. Exam conducted with a chaperone present.  Constitutional:      Appearance: Normal appearance.  Cardiovascular:     Rate and Rhythm: Normal rate and regular rhythm.     Pulses: Normal pulses.     Heart sounds: Normal heart sounds.  Pulmonary:     Effort: Pulmonary effort is normal.     Breath sounds: Normal breath sounds.  Abdominal:     Palpations: Abdomen is soft. There is no hepatomegaly, splenomegaly or mass.     Tenderness: There is no abdominal tenderness.  Musculoskeletal:     Right lower leg: No edema.     Left lower leg: No edema.  Lymphadenopathy:     Cervical: No cervical adenopathy.     Right cervical: No superficial, deep or posterior cervical adenopathy.    Left cervical: No superficial, deep or posterior cervical adenopathy.     Upper Body:     Right upper body: No supraclavicular or axillary adenopathy.     Left upper body: No supraclavicular or axillary adenopathy.  Neurological:     General: No focal deficit present.     Mental Status: She is alert and oriented to  person, place, and time.  Psychiatric:        Mood and Affect: Mood normal.        Behavior: Behavior normal.     LABS:      Latest Ref Rng & Units 01/18/2023   11:51 AM 12/28/2022   11:50 AM 12/07/2022   12:25 PM  CBC  WBC 4.0 - 10.5 K/uL 6.3  5.4  5.9   Hemoglobin 12.0 - 15.0 g/dL 11.5  11.7  11.0   Hematocrit 36.0 - 46.0 % 35.2  35.1  33.8   Platelets 150 - 400 K/uL 148  143  170       Latest Ref Rng & Units 01/18/2023   11:51 AM 12/28/2022   11:50 AM 12/07/2022  12:25 PM  CMP  Glucose 70 - 99 mg/dL 94  94  150   BUN 8 - 23 mg/dL 30  35  28   Creatinine 0.44 - 1.00 mg/dL 1.44  1.62  1.51   Sodium 135 - 145 mmol/L 135  137  134   Potassium 3.5 - 5.1 mmol/L 4.4  4.9  4.5   Chloride 98 - 111 mmol/L 99  103  102   CO2 22 - 32 mmol/L 25  24  24   $ Calcium 8.9 - 10.3 mg/dL 10.0  9.7  9.3   Total Protein 6.5 - 8.1 g/dL 7.7  7.5  7.0   Total Bilirubin 0.3 - 1.2 mg/dL 0.7  0.4  0.6   Alkaline Phos 38 - 126 U/L 62  61  72   AST 15 - 41 U/L 20  21  22   $ ALT 0 - 44 U/L 15  17  18      $ Lab Results  Component Value Date   CEA1 2.1 12/07/2022   /  CEA  Date Value Ref Range Status  12/07/2022 2.1 0.0 - 4.7 ng/mL Final    Comment:    (NOTE)                             Nonsmokers          <3.9                             Smokers             <5.6 Roche [employer] Electrochemiluminescence Immunoassay (ECLIA) Values obtained with different assay methods or kits cannot be used interchangeably.  Results cannot be interpreted as absolute evidence of the presence or absence of malignant disease. Performed At: [facility] [place], [place] [id number] Rush Farmer MD [id number]    No results found for: "PSA1" No results found for: "CAN199" No results found for: "CAN125"  No results found for: "TOTALPROTELP", "ALBUMINELP", "A1GS", "A2GS", "BETS", "BETA2SER", "GAMS", "MSPIKE", "SPEI" Lab Results  Component Value Date   TIBC 396 05/13/2020   TIBC 395  05/13/2020   TIBC 336 03/25/2020   FERRITIN 65 05/13/2020   FERRITIN 64 05/13/2020   FERRITIN 17 03/25/2020   IRONPCTSAT 23 05/13/2020   IRONPCTSAT 23 05/13/2020   IRONPCTSAT 5 (LL) 03/25/2020   No results found for: "LDH"   STUDIES:   Intravitreal Injection, Pharmacologic Agent - OS - Left Eye  Result Date: 01/13/2023 Time Out 01/13/2023. 8:30 AM. Confirmed correct patient, procedure, site, and patient consented. Anesthesia Topical anesthesia was used. Anesthetic medications included Lidocaine 2%, Proparacaine 0.5%. Procedure Preparation included 5% betadine to ocular surface, eyelid speculum. A (32g) needle was used. Injection: 6 mg faricimab-svoa 6 MG/0.05ML   Route: Intravitreal, Site: Left Eye   NDC: [id number], Lot: [id number], Expiration date: 01/04/2025, Waste: 0 mL Post-op Post injection exam found visual acuity of at least counting fingers. The patient tolerated the procedure well. There were no complications. The patient received written and verbal post procedure care education. Post injection medications were not given.   OCT, Retina - OU - Both Eyes  Result Date: 01/13/2023 Right Eye Quality was good. Central Foveal Thickness: 532. Progression has been stable. Findings include no SRF, abnormal foveal contour, retinal drusen , outer retinal tubulation, subretinal hyper-reflective material, disciform scar, epiretinal membrane, intraretinal fluid, pigment epithelial detachment, outer retinal  atrophy (Stable sub-retinal scar, mild persistent cystic changes overlying -- slightly improved). Left Eye Quality was good. Central Foveal Thickness: 441. Progression has been stable. Findings include no SRF, abnormal foveal contour, retinal drusen , subretinal hyper-reflective material, intraretinal fluid, pigment epithelial detachment, outer retinal atrophy (Persistent cystic changes overlying PED/SRHM -- no significant improvement). Notes *Images captured and stored on drive Diagnosis / Impression:  Exudative ARMD OU OD: Stable sub-retinal scar, mild persistent cystic changes overlying -- slightly improved OS: Persistent cystic changes overlying PED/SRHM -- no significant improvement Clinical management: See below Abbreviations: NFP - Normal foveal profile. CME - cystoid macular edema. PED - pigment epithelial detachment. IRF - intraretinal fluid. SRF - subretinal fluid. EZ - ellipsoid zone. ERM - epiretinal membrane. ORA - outer retinal atrophy. ORT - outer retinal tubulation. SRHM - subretinal hyper-reflective material   CT CHEST ABDOMEN PELVIS W CONTRAST  Result Date: 01/11/2023 CLINICAL DATA:  Colorectal carcinoma. Status post colectomy. Chemotherapy ongoing. Assess treatment response. * Tracking Code: BO * EXAM: CT CHEST, ABDOMEN, AND PELVIS WITH CONTRAST TECHNIQUE: Multidetector CT imaging of the chest, abdomen and pelvis was performed following the standard protocol during bolus administration of intravenous contrast. RADIATION DOSE REDUCTION: This exam was performed according to the departmental dose-optimization program which includes automated exposure control, adjustment of the mA and/or kV according to patient size and/or use of iterative reconstruction technique. CONTRAST:  21m OMNIPAQUE IOHEXOL 300 MG/ML  SOLN COMPARISON:  None Available. FINDINGS: CT CHEST FINDINGS Cardiovascular: Port in the anterior chest wall with tip in distal SVC. Mediastinum/Nodes: No axillary or supraclavicular adenopathy. No mediastinal or hilar adenopathy. No pericardial fluid. Esophagus normal. Lungs/Pleura: Several round pulmonary nodules again demonstrated. 6 mm nodule in the RIGHT lower lobe (image 76/3) unchanged from 6 mm. LEFT lobe pulmonary nodule measuring 7 mm (image 88) unchanged from 6 mm. Much smaller RIGHT upper lobe pulmonary nodule (image 90/3) is also unchanged. No new pulmonary nodules are present. Musculoskeletal: No aggressive osseous lesion. CT ABDOMEN AND PELVIS FINDINGS Hepatobiliary: No focal  hepatic lesion. No biliary ductal dilatation. Gallbladder is normal. Common bile duct is normal. Pancreas: Pancreas is normal. No ductal dilatation. No pancreatic inflammation. Spleen: Simple fluid attenuation cystic lesion the spleen unchanged. Adrenals/urinary tract: Adrenal glands and kidneys are normal. The ureters and bladder normal. No change in thickened bladder wall. Stomach/Bowel: The stomach, duodenum, and small bowel normal. Post partial RIGHT hemicolectomy. NO complication at the RIGHT colon anastomosis. The colon and rectosigmoid colon are normal. No adenopathy within the colon mesentery. No periportal adenopathy. Vascular/Lymphatic: Abdominal aorta is normal caliber with atherosclerotic calcification. There is no retroperitoneal or periportal lymphadenopathy. No pelvic lymphadenopathy. Reproductive: Uterus and adnexa unremarkable. Other: No peritoneal metastasis. Musculoskeletal: No aggressive osseous lesion. IMPRESSION: CHEST IMPRESSION: 1. Stable bilateral pulmonary nodules.  Favor benign nodules. 2. No evidence of thoracic metastasis. PELVIS IMPRESSION: 1. No evidence of colorectal carcinoma recurrence or metastasis in the abdomen pelvis. 2. Post partial RIGHT hemicolectomy. 3.  Aortic Atherosclerosis (ICD10-I70.0). Electronically Signed   By: SSuzy BouchardM.D.   On: 01/11/2023 15:31

## 2023-01-18 NOTE — Progress Notes (Signed)
Patient presents today for Keytruda infusion.  Patient is in satisfactory condition with no complaints voiced.  Vital signs are stable.  Labs reviewed by Dr. Delton Coombes during her office visit.  All labs are within treatment parameters.  We will proceed with treatment per MD orders.   Patient tolerated treatment well with no complaints voiced.  Patient left ambulatory in stable condition.  Vital signs stable at discharge.  Follow up as scheduled.

## 2023-01-18 NOTE — Progress Notes (Signed)
Patient has been examined by Dr. Katragadda, and vital signs and labs have been reviewed. ANC, Creatinine, LFTs, hemoglobin, and platelets are within treatment parameters per M.D. - pt may proceed with treatment.  Primary RN and pharmacy notified.  

## 2023-01-18 NOTE — Patient Instructions (Addendum)
[place] at [facility] Discharge Instructions   You were seen and examined today by Dr. Delton Coombes.  He reviewed the results of your lab work which are normal/stable.   He reviewed the results of your CT scan which was stable.   Stop taking the iron pill daily and see if this help with you occasionally leaking stool.   We will proceed with your treatment today.   Return as scheduled.      Thank you for choosing [place] at [facility] to provide your oncology and hematology care.  To afford each patient quality time with our provider, please arrive at least 15 minutes before your scheduled appointment time.   If you have a lab appointment with the [facility] please come in thru the Main Entrance and check in at the main information desk.  You need to re-schedule your appointment should you arrive 10 or more minutes late.  We strive to give you quality time with our providers, and arriving late affects you and other patients whose appointments are after yours.  Also, if you no show three or more times for appointments you may be dismissed from the clinic at the providers discretion.     Again, thank you for choosing [facility].  Our hope is that these requests will decrease the amount of time that you wait before being seen by our physicians.       _____________________________________________________________  Should you have questions after your visit to [facility], please contact our office at [phone] and follow the prompts.  Our office hours are 8:00 a.m. and 4:30 p.m. Monday - Friday.  Please note that voicemails left after 4:00 p.m. may not be returned until the following business day.  We are closed weekends and major holidays.  You do have access to a nurse 24-7, just call the main number to the clinic [phone] and do not press any options, hold on the line and a nurse will answer the phone.     For prescription refill requests, have your pharmacy contact our office and allow 72 hours.    Due to Covid, you will need to wear a mask upon entering the hospital. If you do not have a mask, a mask will be given to you at the Main Entrance upon arrival. For doctor visits, patients may have 1 support person age 3 or older with them. For treatment visits, patients can not have anyone with them due to social distancing guidelines and our immunocompromised population.

## 2023-01-18 NOTE — Patient Instructions (Signed)
[place]  Discharge Instructions: Thank you for choosing [place] [facility] to provide your oncology and hematology care.  If you have a lab appointment with the [facility], please come in thru the Main Entrance and check in at the main information desk.  Wear comfortable clothing and clothing appropriate for easy access to any Portacath or PICC line.   We strive to give you quality time with your provider. You may need to reschedule your appointment if you arrive late (15 or more minutes).  Arriving late affects you and other patients whose appointments are after yours.  Also, if you miss three or more appointments without notifying the office, you may be dismissed from the clinic at the provider's discretion.      For prescription refill requests, have your pharmacy contact our office and allow 72 hours for refills to be completed.    Today you received the following chemotherapy and/or immunotherapy agents Keytruda.  Pembrolizumab Injection What is this medication? PEMBROLIZUMAB (PEM broe LIZ ue mab) treats some types of cancer. It works by helping your immune system slow or stop the spread of cancer cells. It is a monoclonal antibody. This medicine may be used for other purposes; ask your health care provider or pharmacist if you have questions. COMMON BRAND NAME(S): Keytruda What should I tell my care team before I take this medication? They need to know if you have any of these conditions: Allogeneic stem cell transplant (uses someone else's stem cells) Autoimmune diseases, such as Crohn disease, ulcerative colitis, lupus History of chest radiation Nervous system problems, such as Guillain-Barre syndrome, myasthenia gravis Organ transplant An unusual or allergic reaction to pembrolizumab, other medications, foods, dyes, or preservatives Pregnant or trying to get pregnant Breast-feeding How should I use this medication? This medication is injected  into a vein. It is given by your care team in a hospital or clinic setting. A special MedGuide will be given to you before each treatment. Be sure to read this information carefully each time. Talk to your care team about the use of this medication in children. While it may be prescribed for children as young as 6 months for selected conditions, precautions do apply. Overdosage: If you think you have taken too much of this medicine contact a poison control center or emergency room at once. NOTE: This medicine is only for you. Do not share this medicine with others. What if I miss a dose? Keep appointments for follow-up doses. It is important not to miss your dose. Call your care team if you are unable to keep an appointment. What may interact with this medication? Interactions have not been studied. This list may not describe all possible interactions. Give your health care provider a list of all the medicines, herbs, non-prescription drugs, or dietary supplements you use. Also tell them if you smoke, drink alcohol, or use illegal drugs. Some items may interact with your medicine. What should I watch for while using this medication? Your condition will be monitored carefully while you are receiving this medication. You may need blood work while taking this medication. This medication may cause serious skin reactions. They can happen weeks to months after starting the medication. Contact your care team right away if you notice fevers or flu-like symptoms with a rash. The rash may be red or purple and then turn into blisters or peeling of the skin. You may also notice a red rash with swelling of the face, lips, or lymph  nodes in your neck or under your arms. Tell your care team right away if you have any change in your eyesight. Talk to your care team if you may be pregnant. Serious birth defects can occur if you take this medication during pregnancy and for 4 months after the last dose. You will need a  negative pregnancy test before starting this medication. Contraception is recommended while taking this medication and for 4 months after the last dose. Your care team can help you find the option that works for you. Do not breastfeed while taking this medication and for 4 months after the last dose. What side effects may I notice from receiving this medication? Side effects that you should report to your care team as soon as possible: Allergic reactions--skin rash, itching, hives, swelling of the face, lips, tongue, or throat Dry cough, shortness of breath or trouble breathing Eye pain, redness, irritation, or discharge with blurry or decreased vision Heart muscle inflammation--unusual weakness or fatigue, shortness of breath, chest pain, fast or irregular heartbeat, dizziness, swelling of the ankles, feet, or hands Hormone gland problems--headache, sensitivity to light, unusual weakness or fatigue, dizziness, fast or irregular heartbeat, increased sensitivity to cold or heat, excessive sweating, constipation, hair loss, increased thirst or amount of urine, tremors or shaking, irritability Infusion reactions--chest pain, shortness of breath or trouble breathing, feeling faint or lightheaded Kidney injury (glomerulonephritis)--decrease in the amount of urine, red or dark Smiley Birr urine, foamy or bubbly urine, swelling of the ankles, hands, or feet Liver injury--right upper belly pain, loss of appetite, nausea, light-colored stool, dark yellow or Fransheska Willingham urine, yellowing skin or eyes, unusual weakness or fatigue Pain, tingling, or numbness in the hands or feet, muscle weakness, change in vision, confusion or trouble speaking, loss of balance or coordination, trouble walking, seizures Rash, fever, and swollen lymph nodes Redness, blistering, peeling, or loosening of the skin, including inside the mouth Sudden or severe stomach pain, bloody diarrhea, fever, nausea, vomiting Side effects that usually do not  require medical attention (report to your care team if they continue or are bothersome): Bone, joint, or muscle pain Diarrhea Fatigue Loss of appetite Nausea Skin rash This list may not describe all possible side effects. Call your doctor for medical advice about side effects. You may report side effects to FDA at 1-800-FDA-1088. Where should I keep my medication? This medication is given in a hospital or clinic. It will not be stored at home. NOTE: This sheet is a summary. It may not cover all possible information. If you have questions about this medicine, talk to your doctor, pharmacist, or health care provider.  2023 Elsevier/Gold Standard (2013-08-12 00:00:00)        To help prevent nausea and vomiting after your treatment, we encourage you to take your nausea medication as directed.  BELOW ARE SYMPTOMS THAT SHOULD BE REPORTED IMMEDIATELY: *FEVER GREATER THAN 100.4 F (38 C) OR HIGHER *CHILLS OR SWEATING *NAUSEA AND VOMITING THAT IS NOT CONTROLLED WITH YOUR NAUSEA MEDICATION *UNUSUAL SHORTNESS OF BREATH *UNUSUAL BRUISING OR BLEEDING *URINARY PROBLEMS (pain or burning when urinating, or frequent urination) *BOWEL PROBLEMS (unusual diarrhea, constipation, pain near the anus) TENDERNESS IN MOUTH AND THROAT WITH OR WITHOUT PRESENCE OF ULCERS (sore throat, sores in mouth, or a toothache) UNUSUAL RASH, SWELLING OR PAIN  UNUSUAL VAGINAL DISCHARGE OR ITCHING   Items with * indicate a potential emergency and should be followed up as soon as possible or go to the Emergency Department if any problems should occur.  Please show the CHEMOTHERAPY ALERT CARD or IMMUNOTHERAPY ALERT CARD at check-in to the Emergency Department and triage nurse.  Should you have questions after your visit or need to cancel or reschedule your appointment, please contact [facility] [phone]  and follow the prompts.  Office hours are 8:00 a.m. to 4:30 p.m. Monday - Friday. Please note  that voicemails left after 4:00 p.m. may not be returned until the following business day.  We are closed weekends and major holidays. You have access to a nurse at all times for urgent questions. Please call the main number to the clinic [phone] and follow the prompts.  For any non-urgent questions, you may also contact your provider using MyChart. We now offer e-Visits for anyone 8 and older to request care online for non-urgent symptoms. For details visit [email or web address].   Also download the MyChart app! Go to the app store, search "MyChart", open the app, select [facility], and log in with your MyChart username and password.

## 2023-01-19 ENCOUNTER — Other Ambulatory Visit: Payer: Self-pay

## 2023-01-22 ENCOUNTER — Other Ambulatory Visit: Payer: Self-pay

## 2023-01-23 ENCOUNTER — Other Ambulatory Visit: Payer: Self-pay

## 2023-01-24 ENCOUNTER — Other Ambulatory Visit: Payer: Self-pay

## 2023-02-07 NOTE — Progress Notes (Signed)
Gabrielle [name] [address] [place] [address]    Clinic Day:  02/08/2023  Referring physician: Janora Norlander, DO  Patient Care Team: Janora Norlander, DO as PCP - General (Family Medicine) Bernarda Caffey, MD as Consulting Physician ([facility]) Baruch Goldmann, MD as Consulting Physician (Ophthalmology) Danie Binder, MD (Inactive) as Consulting Physician (Gastroenterology) Derek Jack, MD as Medical Oncologist (Oncology)   ASSESSMENT & PLAN:   Assessment : 1.  Stage IIIb Joen Laura) poorly differentiated right colon adenocarcinoma: -Right hemicolectomy on 04/07/2020 with poorly differentiated adenocarcinoma, pT4a, positive radial margin, 1/15 lymph nodes positive, loss of MLH1 and PMS2, bladder biopsy negative for malignancy. -CTAP on 04/06/2020 showed circumferential ascending colon mass with numerous borderline enlarged pericolonic lymph nodes.  No findings of hepatic metastatic disease. -CEA on 04/04/2020 was 12.2.  CEA improved to 1.7 on 05/13/2020. -PET scan on 05/11/2020 shows mild FDG uptake associated with the anastomotic site.  Small pulmonary nodules that do not show elevated FDG activity, remain nonspecific, subcentimeter.  Persistent but improved bladder wall thickening. -Cycle 1 of dose reduced FOLFOX on 05/19/2020, followed by 2 hospitalizations, 1 from acute kidney injury from diarrhea and second admission for C. difficile colitis. -Cycle 2 of 5-FU and leucovorin dose reduced on 06/30/2020.  Chemotherapy discontinued secondary to intolerance. -Follow-up CT scan on 11/02/2020 with multiple soft tissue lesions in the central and right mesentery measuring up to 3.1 x 1.7 cm.  Mild lymphadenopathy in the hepatic duodenal ligament, retroperitoneal space, right common iliac chain, bilateral external iliac chains.  17 mm soft tissue nodule in the lower anterior abdominal wall.  Bilateral tiny pulmonary nodules not substantially changed.  2.9 x 2.5  cm low-density lesion along the posterior uterus is indeterminate. -Pembrolizumab started on 11/10/2020. -CT CAP from 02/09/2021 showed interval near complete resolution of previously demonstrated nodal mass in the ileocolonic mesentery.  Residual ill-defined nodule medial to the tip of the right hepatic lobe.  Stable small pulmonary nodules bilaterally consistent with benign findings.   2.  Family history: -Paternal uncle had colon cancer.  Maternal aunt had brain cancer and another maternal aunt had gynecological malignancy.  Father had lung cancer and was a smoker. - Genetic testing did not reveal any notable mutations.   3.  Diffuse erythema and nodularity of the dome of the bladder: -CT scan showed severely thickened bladder wall. -Cystoscopy on 04/08/2020 showed diffuse erythema, nodularity in the posterior wall tracking to the dome.  Ureteral orifices were in normal locations.  Biopsies were benign.  Plan:  1.  Stage IV right colon adenocarcinoma, MSI-high: - CT CAP (01/11/2023): Bilateral lung nodules, favor benign.  No evidence of metastatic disease in the chest, abdomen or pelvis. - CEA was 2.1 on 12/07/2022. - Reviewed labs today which showed normal LFTs.  CKD stable with creatinine 1.4.  CBC was grossly normal with mild thrombocytopenia which is stable.  Last TSH is 2.9. - She denies any diarrhea.  Fecal leakage reported at last visit improved after she stopped taking iron tablet.  Will check iron panel at next visit.  If low will consider parenteral iron therapy. - Proceed with Keytruda today and every 3 weeks.  RTC 9 weeks for follow-up.  Will plan to repeat CEA at that time.   2.  Paroxysmal atrial fibrillation: - Continue Eliquis indefinitely.  No bleeding issues.   3.  Hypertension: - Continue Hyzaar and amlodipine.  Blood pressure today is 147/62.  4.  Hypokalemia: - Continue potassium  20 mEq 3 times daily.  Potassium is normal at 4.7.  No orders of the defined types were placed  in this encounter.    I,Alexis Herring,acting as a Education administrator for [employer], MD.,have documented all relevant documentation on the behalf of Derek Jack, MD,as directed by  Derek Jack, MD while in the presence of Derek Jack, MD.  I, Derek Jack MD, have reviewed the above documentation for accuracy and completeness, and I agree with the above.    Derek Jack, MD   3/6/202412:51 PM  CHIEF COMPLAINT:   Diagnosis: right colon cancer    Cancer Staging  Malignant neoplasm of colon [facility]) Staging form: Colon and Rectum, AJCC 8th Edition - Clinical stage from 04/27/2020: Stage IIIB (cT4a, cN1a, cM0) - Unsigned - Pathologic stage from 11/05/2020: Stage IVC (rpTX, pN0, pM1c) - Signed by Derek Jack, MD on 11/05/2020    Prior Therapy: Beryle Flock every 3 weeks    Current Therapy:  COLORECTAL Pembrolizumab   HISTORY OF PRESENT ILLNESS:   Oncology History  Malignant neoplasm of colon ([place])  04/07/2020 Initial Diagnosis   Malignant neoplasm of ascending colon ([place])   05/13/2020 Genetic Testing   [facility]     05/19/2020 - 07/02/2020 Chemotherapy   The patient had palonosetron (ALOXI) injection 0.25 mg, 0.25 mg, Intravenous,  Once, 2 of 12 cycles Administration: 0.25 mg (05/19/2020), 0.25 mg (06/30/2020) leucovorin 522 mg in dextrose 5 % 250 mL infusion, 320 mg/m2 = 522 mg (80 % of original dose 400 mg/m2), Intravenous,  Once, 2 of 12 cycles Dose modification: 320 mg/m2 (80 % of original dose 400 mg/m2, Cycle 1, Reason: Provider Judgment), [id number] mg/m2 (66.7 % of original dose 400 mg/m2, Cycle 2, Reason: Provider Judgment) Administration: 522 mg (05/19/2020), 434 mg (06/30/2020) oxaliplatin (ELOXATIN) 110 mg in dextrose 5 % 500 mL chemo infusion, 68 mg/m2 = 110 mg (80 % of original dose 85 mg/m2), Intravenous,  Once, 1 of 1 cycle Dose modification: 68 mg/m2 (80 % of original dose 85 mg/m2, Cycle 1, Reason: Provider  Judgment) Administration: 110 mg (05/19/2020) fluorouracil (ADRUCIL) chemo injection 500 mg, 320 mg/m2 = 500 mg (80 % of original dose 400 mg/m2), Intravenous,  Once, 2 of 12 cycles Dose modification: 320 mg/m2 (80 % of original dose 400 mg/m2, Cycle 1, Reason: Provider Judgment), [id number] mg/m2 (66.7 % of original dose 400 mg/m2, Cycle 2, Reason: Provider Judgment) Administration: 500 mg (05/19/2020), 450 mg (06/30/2020) fluorouracil (ADRUCIL) 3,150 mg in sodium chloride 0.9 % 87 mL chemo infusion, 1,920 mg/m2 = 3,150 mg (80 % of original dose 2,400 mg/m2), Intravenous, 1 Day/Dose, 2 of 12 cycles Dose modification: 1,920 mg/m2 (80 % of original dose 2,400 mg/m2, Cycle 1, Reason: Provider Judgment), 1,600 mg/m2 (66.7 % of original dose 2,400 mg/m2, Cycle 2, Reason: Provider Judgment) Administration: 3,150 mg (05/19/2020), 2,600 mg (06/30/2020)  for chemotherapy treatment.    08/03/2020 Genetic Testing   [facility] Reveal Testing     08/14/2020 Genetic Testing   No pathogenic variants detected in [facility] Common Hereditary Cancers Panel.  Variant of uncertain significance (VUS) detected in HOXB13 at c.634G>A (p.Ala212Thr). The Common Hereditary Cancers Panel offered by [facility] includes sequencing and/or deletion duplication testing of the following 48 genes: APC, ATM, AXIN2, BARD1, BMPR1A, BRCA1, BRCA2, BRIP1, CDH1, CDK4, CDKN2A (p14ARF), CDKN2A (p16INK4a), CHEK2, CTNNA1, DICER1, EPCAM (Deletion/duplication testing only), FLCN, GREM1 (promoter region deletion/duplication testing only), KIT, MEN1, MLH1, MSH2, MSH3, MSH6, MUTYH, NBN, NF1, NHTL1, PALB2, PDGFRA, PMS2, POLD1, POLE, PTEN, RAD50, RAD51C, RAD51D, RNF43, SDHB,  SDHC, SDHD, SMAD4, SMARCA4. STK11, TP53, TSC1, TSC2, and VHL.  The following genes were evaluated for sequence changes only: SDHA and HOXB13 c.251G>A variant only. The report date is August 14, 2020.    11/05/2020 Cancer Staging   Staging form: Colon and Rectum, AJCC 8th Edition - Pathologic  stage from 11/05/2020: Stage IVC (rpTX, pN0, pM1c) - Signed by Derek Jack, MD on 11/05/2020   11/10/2020 - 07/13/2022 Chemotherapy   Patient is on Treatment Plan : COLORECTAL Pembrolizumab q21d     11/10/2020 -  Chemotherapy   Patient is on Treatment Plan : COLORECTAL Pembrolizumab (200) q21d        INTERVAL HISTORY:   Skyleigh is a 77 y.o. female presenting to clinic today for follow up of right colon cancer. She was last seen by me on 01/18/23.  Today, she states that she is doing well overall. Her appetite level is at 100%. Her energy level is at 100%.  Denies any diarrhea, skin rashes, dry cough or shortness of breath.   PAST MEDICAL HISTORY:   Past Medical History: Past Medical History:  Diagnosis Date   Arthritis    Cancer [facility])    Cataract    OU   Colon cancer ([place])    Family history of pancreatic cancer 08/21/2020   Family history of uterine cancer 08/21/2020   H/O cesarean section    Hx of tonsillectomy    Hyperlipidemia    Hypertension    Macular degeneration    Exu ARMD OU   Paroxysmal atrial fibrillation ([place]) 07/05/2020    Surgical History: Past Surgical History:  Procedure Laterality Date   BIOPSY  04/06/2020   Procedure: BIOPSY;  Surgeon: Rogene Houston, MD;  Location[facility];  Service: Endoscopy;;   CARPAL TUNNEL RELEASE Right    CATARACT EXTRACTION W/PHACO Left 10/11/2019   Procedure: CATARACT EXTRACTION PHACO AND INTRAOCULAR LENS PLACEMENT ([place]);  Surgeon: Baruch Goldmann, MD;  Location: [facility] ORS;  Service: Ophthalmology;  Laterality: Left;  CDE: 8.56   CATARACT EXTRACTION W/PHACO Right 10/25/2019   Procedure: CATARACT EXTRACTION PHACO AND INTRAOCULAR LENS PLACEMENT ([facility]);  Surgeon: Baruch Goldmann, MD;  Location: [facility] ORS;  Service: Ophthalmology;  Laterality: Right;  CDE: 5.67   CESAREAN SECTION     COLONOSCOPY N/A 04/06/2020   Procedure: COLONOSCOPY;  Surgeon: Rogene Houston, MD;  Location: [facility];  Service: Endoscopy;  Laterality: N/A;    CYSTOSCOPY WITH BIOPSY N/A 04/08/2020   Procedure: CYSTOSCOPY WITH BIOPSY;  Surgeon: Cleon Gustin, MD;  Location: [facility] ORS;  Service: Urology;  Laterality: N/A;   ESOPHAGOGASTRODUODENOSCOPY N/A 04/05/2020   Procedure: ESOPHAGOGASTRODUODENOSCOPY (EGD);  Surgeon: Rogene Houston, MD;  Location: [facility];  Service: Endoscopy;  Laterality: N/A;   PARTIAL COLECTOMY N/A 04/08/2020   Procedure: PARTIAL COLECTOMY;  Surgeon: Virl Cagey, MD;  Location: [facility] ORS;  Service: General;  Laterality: N/A;   PORTACATH PLACEMENT Left 05/18/2020   Procedure: INSERTION PORT-A-CATH (ATTACHED CATHETER IN LEFT SUBCLAVIAN);  Surgeon: Virl Cagey, MD;  Location: [facility] ORS;  Service: General;  Laterality: Left;   TONSILLECTOMY      Social History: Social History   Socioeconomic History   Marital status: Divorced    Spouse name: Not on file   Number of children: Not on file   Years of education: Not on file   Highest education level: Not on file  Occupational History   Not on file  Tobacco Use   Smoking status: Never   Smokeless  tobacco: Never  Vaping Use   Vaping Use: Never used  Substance and Sexual Activity   Alcohol use: No   Drug use: No   Sexual activity: Not Currently  Other Topics Concern   Not on file  Social History Narrative   Not on file   Social Determinants of Health   Financial Resource Strain: Low Risk  (08/31/2022)   Overall Financial Resource Strain (CARDIA)    Difficulty of Paying Living Expenses: Not hard at all  Food Insecurity: No Food Insecurity (02/21/2022)   Hunger Vital Sign    Worried About Running Out of Food in the Last Year: Never true    Ran Out of Food in the Last Year: Never true  Transportation Needs: No Transportation Needs (08/31/2022)   PRAPARE - Hydrologist (Medical): No    Lack of Transportation (Non-Medical): No  Physical Activity: Sufficiently Active (02/21/2022)   Exercise Vital Sign    Days of Exercise per Week:  5 days    Minutes of Exercise per Session: 30 min  Stress: No Stress Concern Present (02/21/2022)   [place]    Feeling of Stress : Not at all  Social Connections: Moderately Isolated (02/21/2022)   Social Connection and Isolation Panel [NHANES]    Frequency of Communication with Friends and Family: More than three times a week    Frequency of Social Gatherings with Friends and Family: More than three times a week    Attends Religious Services: 1 to 4 times per year    Active Member of [employer] or Organizations: No    Attends Archivist Meetings: Never    Marital Status: Divorced  Human resources officer Violence: Not At Risk (02/21/2022)   Humiliation, Afraid, Rape, and Kick questionnaire    Fear of Current or Ex-Partner: No    Emotionally Abused: No    Physically Abused: No    Sexually Abused: No    Family History: Family History  Problem Relation Age of Onset   Macular degeneration Mother    Stroke Mother    Hypertension Mother    Dementia Mother    Lung cancer Father        dx late 78s; smoking hx   Diabetes Sister    Hypertension Sister    Leukemia Paternal Uncle        d. 48s   Cancer Maternal Aunt        ovarian or endometrial dx 85s   Pancreatic cancer Paternal Uncle        d. late 80s    Current Medications:  Current Outpatient Medications:    alendronate (FOSAMAX) 70 MG tablet, TAKE ONE TABLET EVERY 7 DAYS ON AN EMPTY STOMACH WITH A FULL GLASS OF WATER, Disp: 12 tablet, Rfl: 1   amLODipine (NORVASC) 10 MG tablet, Take 1 tablet (10 mg total) by mouth daily. For blood pressure, Disp: 90 tablet, Rfl: 3   atorvastatin (LIPITOR) 20 MG tablet, Take 1 tablet (20 mg total) by mouth daily. For cholesterol, Disp: 90 tablet, Rfl: 3   cholecalciferol (VITAMIN D3) 25 MCG (1000 UT) tablet, Take 1,000 Units by mouth daily., Disp: , Rfl:    CRANBERRY FRUIT PO, Take 1 tablet by mouth daily., Disp: , Rfl:     diclofenac Sodium (VOLTAREN) 1 % GEL, APPLY 4 GRAMS TO AFFECTED AREA(S) 4 TIMES A DAY, Disp: 400 g, Rfl: 2   dorzolamide-timolol (COSOPT) 2-0.5 % ophthalmic solution, INSTILL  ONE DROP IN [facility] EYE TWICE DAILY, Disp: 10 mL, Rfl: 10   ELIQUIS 5 MG TABS tablet, TAKE ONE TABLET TWICE DAILY, Disp: 60 tablet, Rfl: 6   hydrALAZINE (APRESOLINE) 25 MG tablet, Take 1 tablet (25 mg total) by mouth 2 (two) times daily., Disp: 180 tablet, Rfl: 3   losartan-hydrochlorothiazide (HYZAAR) 100-25 MG tablet, TAKE 1 TABLET DAILY, Disp: 90 tablet, Rfl: 3   potassium chloride SA (KLOR-CON M) 20 MEQ tablet, TAKE ONE TABLET THREE TIMES DAILY, Disp: 90 tablet, Rfl: 3   spironolactone (ALDACTONE) 25 MG tablet, Take 1 tablet (25 mg total) by mouth daily., Disp: 90 tablet, Rfl: 3   Allergies: Allergies  Allergen Reactions   Lisinopril Cough    REVIEW OF SYSTEMS:   Review of Systems  Constitutional:  Negative for chills, fatigue and fever.  HENT:   Negative for lump/mass, mouth sores, nosebleeds, sore throat and trouble swallowing.   Eyes:  Negative for eye problems.  Respiratory:  Negative for cough and shortness of breath.   Cardiovascular:  Negative for chest pain, leg swelling and palpitations.  Gastrointestinal:  Negative for abdominal pain, constipation, diarrhea, nausea and vomiting.  Genitourinary:  Negative for bladder incontinence, difficulty urinating, dysuria, frequency, hematuria and nocturia.   Musculoskeletal:  Negative for arthralgias, back pain, flank pain, myalgias and neck pain.  Skin:  Negative for itching and rash.  Neurological:  Negative for dizziness, headaches and numbness.  Hematological:  Does not bruise/bleed easily.  Psychiatric/Behavioral:  Negative for depression, sleep disturbance and suicidal ideas. The patient is not nervous/anxious.   All other systems reviewed and are negative.    VITALS:   Blood pressure (!) 147/62, pulse 67, temperature 97.8 F (36.6 C), temperature source  Oral, resp. rate 16, height '5\' 2"'$  (1.575 m), weight 175 lb 4.8 oz (79.5 kg), SpO2 98 %.  Wt Readings from Last 3 Encounters:  02/08/23 175 lb 4.8 oz (79.5 kg)  01/18/23 173 lb 12.8 oz (78.8 kg)  12/07/22 175 lb 6.4 oz (79.6 kg)    Body mass index is 32.06 kg/m.  Performance status (ECOG): 1 - Symptomatic but completely ambulatory  PHYSICAL EXAM:   Physical Exam Vitals and nursing note reviewed. Exam conducted with a chaperone present.  Constitutional:      Appearance: Normal appearance.  Cardiovascular:     Rate and Rhythm: Normal rate and regular rhythm.     Pulses: Normal pulses.     Heart sounds: Normal heart sounds.  Pulmonary:     Effort: Pulmonary effort is normal.     Breath sounds: Normal breath sounds.  Abdominal:     Palpations: Abdomen is soft. There is no hepatomegaly, splenomegaly or mass.     Tenderness: There is no abdominal tenderness.  Musculoskeletal:     Right lower leg: No edema.     Left lower leg: No edema.  Lymphadenopathy:     Cervical: No cervical adenopathy.     Right cervical: No superficial, deep or posterior cervical adenopathy.    Left cervical: No superficial, deep or posterior cervical adenopathy.     Upper Body:     Right upper body: No supraclavicular or axillary adenopathy.     Left upper body: No supraclavicular or axillary adenopathy.  Neurological:     General: No focal deficit present.     Mental Status: She is alert and oriented to person, place, and time.  Psychiatric:        Mood and Affect: Mood normal.  Behavior: Behavior normal.     LABS:      Latest Ref Rng & Units 02/08/2023   11:11 AM 01/18/2023   11:51 AM 12/28/2022   11:50 AM  CBC  WBC 4.0 - 10.5 K/uL 5.1  6.3  5.4   Hemoglobin 12.0 - 15.0 g/dL 11.7  11.5  11.7   Hematocrit 36.0 - 46.0 % 36.1  35.2  35.1   Platelets 150 - 400 K/uL 129  148  143       Latest Ref Rng & Units 02/08/2023   11:11 AM 01/18/2023   11:51 AM 12/28/2022   11:50 AM  CMP  Glucose 70  - 99 mg/dL 95  94  94   BUN 8 - 23 mg/dL 30  30  35   Creatinine 0.44 - 1.00 mg/dL 1.43  1.44  1.62   Sodium 135 - 145 mmol/L 135  135  137   Potassium 3.5 - 5.1 mmol/L 4.7  4.4  4.9   Chloride 98 - 111 mmol/L 101  99  103   CO2 22 - 32 mmol/L '25  25  24   '$ Calcium 8.9 - 10.3 mg/dL 9.6  10.0  9.7   Total Protein 6.5 - 8.1 g/dL 7.2  7.7  7.5   Total Bilirubin 0.3 - 1.2 mg/dL 0.5  0.7  0.4   Alkaline Phos 38 - 126 U/L 63  62  61   AST 15 - 41 U/L '19  20  21   '$ ALT 0 - 44 U/L '15  15  17      '$ Lab Results  Component Value Date   CEA1 2.1 12/07/2022   /  CEA  Date Value Ref Range Status  12/07/2022 2.1 0.0 - 4.7 ng/mL Final    Comment:    (NOTE)                             Nonsmokers          <3.9                             Smokers             <5.6 [employer] Electrochemiluminescence Immunoassay (ECLIA) Values obtained with different assay methods or kits cannot be used interchangeably.  Results cannot be interpreted as absolute evidence of the presence or absence of malignant disease. Performed At: [facility] [place], [place] [id number] Rush Farmer MD [id number]    No results found for: "PSA1" No results found for: "CAN199" No results found for: "CAN125"  No results found for: "TOTALPROTELP", "ALBUMINELP", "A1GS", "A2GS", "BETS", "BETA2SER", "GAMS", "MSPIKE", "SPEI" Lab Results  Component Value Date   TIBC 396 05/13/2020   TIBC 395 05/13/2020   TIBC 336 03/25/2020   FERRITIN 65 05/13/2020   FERRITIN 64 05/13/2020   FERRITIN 17 03/25/2020   IRONPCTSAT 23 05/13/2020   IRONPCTSAT 23 05/13/2020   IRONPCTSAT 5 (LL) 03/25/2020   No results found for: "LDH"   STUDIES:   Intravitreal Injection, Pharmacologic Agent - OS - Left Eye  Result Date: 01/13/2023 Time Out 01/13/2023. 8:30 AM. Confirmed correct patient, procedure, site, and patient consented. Anesthesia Topical anesthesia was used. Anesthetic medications included Lidocaine 2%,  Proparacaine 0.5%. Procedure Preparation included 5% betadine to ocular surface, eyelid speculum. A (32g) needle was used. Injection: 6 mg faricimab-svoa 6 MG/0.05ML   Route:  Intravitreal, Site: Left Eye   NDC: [id number], Lot: [id number], Expiration date: 01/04/2025, Waste: 0 mL Post-op Post injection exam found visual acuity of at least counting fingers. The patient tolerated the procedure well. There were no complications. The patient received written and verbal post procedure care education. Post injection medications were not given.   OCT, Retina - OU - Both Eyes  Result Date: 01/13/2023 Right Eye Quality was good. Central Foveal Thickness: 532. Progression has been stable. Findings include no SRF, abnormal foveal contour, retinal drusen , outer retinal tubulation, subretinal hyper-reflective material, disciform scar, epiretinal membrane, intraretinal fluid, pigment epithelial detachment, outer retinal atrophy (Stable sub-retinal scar, mild persistent cystic changes overlying -- slightly improved). Left Eye Quality was good. Central Foveal Thickness: 441. Progression has been stable. Findings include no SRF, abnormal foveal contour, retinal drusen , subretinal hyper-reflective material, intraretinal fluid, pigment epithelial detachment, outer retinal atrophy (Persistent cystic changes overlying PED/SRHM -- no significant improvement). Notes *Images captured and stored on drive Diagnosis / Impression: Exudative ARMD OU OD: Stable sub-retinal scar, mild persistent cystic changes overlying -- slightly improved OS: Persistent cystic changes overlying PED/SRHM -- no significant improvement Clinical management: See below Abbreviations: NFP - Normal foveal profile. CME - cystoid macular edema. PED - pigment epithelial detachment. IRF - intraretinal fluid. SRF - subretinal fluid. EZ - ellipsoid zone. ERM - epiretinal membrane. ORA - outer retinal atrophy. ORT - outer retinal tubulation. SRHM - subretinal  hyper-reflective material   CT CHEST ABDOMEN PELVIS W CONTRAST  Result Date: 01/11/2023 CLINICAL DATA:  Colorectal carcinoma. Status post colectomy. Chemotherapy ongoing. Assess treatment response. * Tracking Code: BO * EXAM: CT CHEST, ABDOMEN, AND PELVIS WITH CONTRAST TECHNIQUE: Multidetector CT imaging of the chest, abdomen and pelvis was performed following the standard protocol during bolus administration of intravenous contrast. RADIATION DOSE REDUCTION: This exam was performed according to the departmental dose-optimization program which includes automated exposure control, adjustment of the mA and/or kV according to patient size and/or use of iterative reconstruction technique. CONTRAST:  54m OMNIPAQUE IOHEXOL 300 MG/ML  SOLN COMPARISON:  None Available. FINDINGS: CT CHEST FINDINGS Cardiovascular: Port in the anterior chest wall with tip in distal SVC. Mediastinum/Nodes: No axillary or supraclavicular adenopathy. No mediastinal or hilar adenopathy. No pericardial fluid. Esophagus normal. Lungs/Pleura: Several round pulmonary nodules again demonstrated. 6 mm nodule in the RIGHT lower lobe (image 76/3) unchanged from 6 mm. LEFT lobe pulmonary nodule measuring 7 mm (image 88) unchanged from 6 mm. Much smaller RIGHT upper lobe pulmonary nodule (image 90/3) is also unchanged. No new pulmonary nodules are present. Musculoskeletal: No aggressive osseous lesion. CT ABDOMEN AND PELVIS FINDINGS Hepatobiliary: No focal hepatic lesion. No biliary ductal dilatation. Gallbladder is normal. Common bile duct is normal. Pancreas: Pancreas is normal. No ductal dilatation. No pancreatic inflammation. Spleen: Simple fluid attenuation cystic lesion the spleen unchanged. Adrenals/urinary tract: Adrenal glands and kidneys are normal. The ureters and bladder normal. No change in thickened bladder wall. Stomach/Bowel: The stomach, duodenum, and small bowel normal. Post partial RIGHT hemicolectomy. NO complication at the RIGHT  colon anastomosis. The colon and rectosigmoid colon are normal. No adenopathy within the colon mesentery. No periportal adenopathy. Vascular/Lymphatic: Abdominal aorta is normal caliber with atherosclerotic calcification. There is no retroperitoneal or periportal lymphadenopathy. No pelvic lymphadenopathy. Reproductive: Uterus and adnexa unremarkable. Other: No peritoneal metastasis. Musculoskeletal: No aggressive osseous lesion. IMPRESSION: CHEST IMPRESSION: 1. Stable bilateral pulmonary nodules.  Favor benign nodules. 2. No evidence of thoracic metastasis. PELVIS IMPRESSION: 1. No evidence of colorectal carcinoma  recurrence or metastasis in the abdomen pelvis. 2. Post partial RIGHT hemicolectomy. 3.  Aortic Atherosclerosis (ICD10-I70.0). Electronically Signed   By: Suzy Bouchard M.D.   On: 01/11/2023 15:31

## 2023-02-08 ENCOUNTER — Other Ambulatory Visit: Payer: Self-pay | Admitting: *Deleted

## 2023-02-08 ENCOUNTER — Inpatient Hospital Stay: Payer: Medicare HMO | Attending: Hematology

## 2023-02-08 ENCOUNTER — Inpatient Hospital Stay (HOSPITAL_BASED_OUTPATIENT_CLINIC_OR_DEPARTMENT_OTHER): Payer: Medicare HMO | Admitting: Hematology

## 2023-02-08 ENCOUNTER — Inpatient Hospital Stay: Payer: Medicare HMO

## 2023-02-08 VITALS — BP 147/62 | HR 67 | Temp 97.8°F | Resp 16 | Ht 62.0 in | Wt 175.3 lb

## 2023-02-08 DIAGNOSIS — C189 Malignant neoplasm of colon, unspecified: Secondary | ICD-10-CM

## 2023-02-08 DIAGNOSIS — D508 Other iron deficiency anemias: Secondary | ICD-10-CM

## 2023-02-08 DIAGNOSIS — C182 Malignant neoplasm of ascending colon: Secondary | ICD-10-CM | POA: Diagnosis not present

## 2023-02-08 DIAGNOSIS — Z79899 Other long term (current) drug therapy: Secondary | ICD-10-CM

## 2023-02-08 DIAGNOSIS — Z79620 Long term (current) use of immunosuppressive biologic: Secondary | ICD-10-CM | POA: Diagnosis not present

## 2023-02-08 DIAGNOSIS — Z5112 Encounter for antineoplastic immunotherapy: Secondary | ICD-10-CM | POA: Insufficient documentation

## 2023-02-08 LAB — COMPREHENSIVE METABOLIC PANEL
ALT: 15 U/L (ref 0–44)
AST: 19 U/L (ref 15–41)
Albumin: 4 g/dL (ref 3.5–5.0)
Alkaline Phosphatase: 63 U/L (ref 38–126)
Anion gap: 9 (ref 5–15)
BUN: 30 mg/dL — ABNORMAL HIGH (ref 8–23)
CO2: 25 mmol/L (ref 22–32)
Calcium: 9.6 mg/dL (ref 8.9–10.3)
Chloride: 101 mmol/L (ref 98–111)
Creatinine, Ser: 1.43 mg/dL — ABNORMAL HIGH (ref 0.44–1.00)
GFR, Estimated: 38 mL/min — ABNORMAL LOW (ref >60)
Glucose, Bld: 95 mg/dL (ref 70–99)
Potassium: 4.7 mmol/L (ref 3.5–5.1)
Sodium: 135 mmol/L (ref 135–145)
Total Bilirubin: 0.5 mg/dL (ref 0.3–1.2)
Total Protein: 7.2 g/dL (ref 6.5–8.1)

## 2023-02-08 LAB — CBC WITH DIFFERENTIAL/PLATELET
Abs Immature Granulocytes: 0.02 10*3/uL (ref 0.00–0.07)
Basophils Absolute: 0 10*3/uL (ref 0.0–0.1)
Basophils Relative: 1 %
Eosinophils Absolute: 0.2 10*3/uL (ref 0.0–0.5)
Eosinophils Relative: 4 %
HCT: 36.1 % (ref 36.0–46.0)
Hemoglobin: 11.7 g/dL — ABNORMAL LOW (ref 12.0–15.0)
Immature Granulocytes: 0 %
Lymphocytes Relative: 23 %
Lymphs Abs: 1.2 10*3/uL (ref 0.7–4.0)
MCH: 31.9 pg (ref 26.0–34.0)
MCHC: 32.4 g/dL (ref 30.0–36.0)
MCV: 98.4 fL (ref 80.0–100.0)
Monocytes Absolute: 0.6 10*3/uL (ref 0.1–1.0)
Monocytes Relative: 11 %
Neutro Abs: 3.1 10*3/uL (ref 1.7–7.7)
Neutrophils Relative %: 61 %
Platelets: 129 10*3/uL — ABNORMAL LOW (ref 150–400)
RBC: 3.67 MIL/uL — ABNORMAL LOW (ref 3.87–5.11)
RDW: 12.7 % (ref 11.5–15.5)
WBC: 5.1 10*3/uL (ref 4.0–10.5)
nRBC: 0 % (ref 0.0–0.2)

## 2023-02-08 LAB — MAGNESIUM: Magnesium: 2 mg/dL (ref 1.7–2.4)

## 2023-02-08 MED ORDER — SODIUM CHLORIDE 0.9 % IV SOLN
200.0000 mg | Freq: Once | INTRAVENOUS | Status: AC
  Administered 2023-02-08: 200 mg via INTRAVENOUS
  Filled 2023-02-08: qty 8

## 2023-02-08 MED ORDER — SODIUM CHLORIDE 0.9 % IV SOLN: Freq: Once | INTRAVENOUS | Status: AC

## 2023-02-08 NOTE — Patient Instructions (Signed)
[facility] at [facility] Discharge Instructions   You were seen and examined today by Dr. Katragadda.  He reviewed the results of your lab work which are normal/stable.   We will proceed with your treatment today.  Return as scheduled.    Thank you for choosing [facility] at [facility] to provide your oncology and hematology care.  To afford each patient quality time with our provider, please arrive at least 15 minutes before your scheduled appointment time.   If you have a lab appointment with the Cancer Center please come in thru the Main Entrance and check in at the main information desk.  You need to re-schedule your appointment should you arrive 10 or more minutes late.  We strive to give you quality time with our providers, and arriving late affects you and other patients whose appointments are after yours.  Also, if you no show three or more times for appointments you may be dismissed from the clinic at the providers discretion.     Again, thank you for choosing [facility].  Our hope is that these requests will decrease the amount of time that you wait before being seen by our physicians.       _____________________________________________________________  Should you have questions after your visit to [facility], please contact our office at [phone] and follow the prompts.  Our office hours are 8:00 a.m. and 4:30 p.m. Monday - Friday.  Please note that voicemails left after 4:00 p.m. may not be returned until the following business day.  We are closed weekends and major holidays.  You do have access to a nurse 24-7, just call the main number to the clinic [phone] and do not press any options, hold on the line and a nurse will answer the phone.    For prescription refill requests, have your pharmacy contact our office and allow 72 hours.    Due to Covid, you will need to wear a mask upon entering  the hospital. If you do not have a mask, a mask will be given to you at the Main Entrance upon arrival. For doctor visits, patients may have 1 support person age 18 or older with them. For treatment visits, patients can not have anyone with them due to social distancing guidelines and our immunocompromised population.      

## 2023-02-08 NOTE — Patient Instructions (Signed)
[facility]  Discharge Instructions: Thank you for choosing [facility] to provide your oncology and hematology care.  If you have a lab appointment with the [facility], please come in thru the Main Entrance and check in at the main information desk.  Wear comfortable clothing and clothing appropriate for easy access to any Portacath or PICC line.   We strive to give you quality time with your provider. You may need to reschedule your appointment if you arrive late (15 or more minutes).  Arriving late affects you and other patients whose appointments are after yours.  Also, if you miss three or more appointments without notifying the office, you may be dismissed from the clinic at the provider's discretion.      For prescription refill requests, have your pharmacy contact our office and allow 72 hours for refills to be completed.    Today you received the following chemotherapy and/or immunotherapy agents Keytruda.  Pembrolizumab Injection What is this medication? PEMBROLIZUMAB (PEM broe LIZ ue mab) treats some types of cancer. It works by helping your immune system slow or stop the spread of cancer cells. It is a monoclonal antibody. This medicine may be used for other purposes; ask your health care provider or pharmacist if you have questions. COMMON BRAND NAME(S): Keytruda What should I tell my care team before I take this medication? They need to know if you have any of these conditions: Allogeneic stem cell transplant (uses someone else's stem cells) Autoimmune diseases, such as Crohn disease, ulcerative colitis, lupus History of chest radiation Nervous system problems, such as Guillain-Barre syndrome, myasthenia gravis Organ transplant An unusual or allergic reaction to pembrolizumab, other medications, foods, dyes, or preservatives Pregnant or trying to get pregnant Breast-feeding How should I use this medication? This medication is injected  into a vein. It is given by your care team in a hospital or clinic setting. A special MedGuide will be given to you before each treatment. Be sure to read this information carefully each time. Talk to your care team about the use of this medication in children. While it may be prescribed for children as young as 6 months for selected conditions, precautions do apply. Overdosage: If you think you have taken too much of this medicine contact a poison control center or emergency room at once. NOTE: This medicine is only for you. Do not share this medicine with others. What if I miss a dose? Keep appointments for follow-up doses. It is important not to miss your dose. Call your care team if you are unable to keep an appointment. What may interact with this medication? Interactions have not been studied. This list may not describe all possible interactions. Give your health care provider a list of all the medicines, herbs, non-prescription drugs, or dietary supplements you use. Also tell them if you smoke, drink alcohol, or use illegal drugs. Some items may interact with your medicine. What should I watch for while using this medication? Your condition will be monitored carefully while you are receiving this medication. You may need blood work while taking this medication. This medication may cause serious skin reactions. They can happen weeks to months after starting the medication. Contact your care team right away if you notice fevers or flu-like symptoms with a rash. The rash may be red or purple and then turn into blisters or peeling of the skin. You may also notice a red rash with swelling of the face, lips, or lymph  nodes in your neck or under your arms. Tell your care team right away if you have any change in your eyesight. Talk to your care team if you may be pregnant. Serious birth defects can occur if you take this medication during pregnancy and for 4 months after the last dose. You will need a  negative pregnancy test before starting this medication. Contraception is recommended while taking this medication and for 4 months after the last dose. Your care team can help you find the option that works for you. Do not breastfeed while taking this medication and for 4 months after the last dose. What side effects may I notice from receiving this medication? Side effects that you should report to your care team as soon as possible: Allergic reactions--skin rash, itching, hives, swelling of the face, lips, tongue, or throat Dry cough, shortness of breath or trouble breathing Eye pain, redness, irritation, or discharge with blurry or decreased vision Heart muscle inflammation--unusual weakness or fatigue, shortness of breath, chest pain, fast or irregular heartbeat, dizziness, swelling of the ankles, feet, or hands Hormone gland problems--headache, sensitivity to light, unusual weakness or fatigue, dizziness, fast or irregular heartbeat, increased sensitivity to cold or heat, excessive sweating, constipation, hair loss, increased thirst or amount of urine, tremors or shaking, irritability Infusion reactions--chest pain, shortness of breath or trouble breathing, feeling faint or lightheaded Kidney injury (glomerulonephritis)--decrease in the amount of urine, red or dark brown urine, foamy or bubbly urine, swelling of the ankles, hands, or feet Liver injury--right upper belly pain, loss of appetite, nausea, light-colored stool, dark yellow or brown urine, yellowing skin or eyes, unusual weakness or fatigue Pain, tingling, or numbness in the hands or feet, muscle weakness, change in vision, confusion or trouble speaking, loss of balance or coordination, trouble walking, seizures Rash, fever, and swollen lymph nodes Redness, blistering, peeling, or loosening of the skin, including inside the mouth Sudden or severe stomach pain, bloody diarrhea, fever, nausea, vomiting Side effects that usually do not  require medical attention (report to your care team if they continue or are bothersome): Bone, joint, or muscle pain Diarrhea Fatigue Loss of appetite Nausea Skin rash This list may not describe all possible side effects. Call your doctor for medical advice about side effects. You may report side effects to FDA at 1-800-FDA-1088. Where should I keep my medication? This medication is given in a hospital or clinic. It will not be stored at home. NOTE: This sheet is a summary. It may not cover all possible information. If you have questions about this medicine, talk to your doctor, pharmacist, or health care provider.  2023 Elsevier/Gold Standard (2013-08-12 00:00:00)        To help prevent nausea and vomiting after your treatment, we encourage you to take your nausea medication as directed.  BELOW ARE SYMPTOMS THAT SHOULD BE REPORTED IMMEDIATELY: *FEVER GREATER THAN 100.4 F (38 C) OR HIGHER *CHILLS OR SWEATING *NAUSEA AND VOMITING THAT IS NOT CONTROLLED WITH YOUR NAUSEA MEDICATION *UNUSUAL SHORTNESS OF BREATH *UNUSUAL BRUISING OR BLEEDING *URINARY PROBLEMS (pain or burning when urinating, or frequent urination) *BOWEL PROBLEMS (unusual diarrhea, constipation, pain near the anus) TENDERNESS IN MOUTH AND THROAT WITH OR WITHOUT PRESENCE OF ULCERS (sore throat, sores in mouth, or a toothache) UNUSUAL RASH, SWELLING OR PAIN  UNUSUAL VAGINAL DISCHARGE OR ITCHING   Items with * indicate a potential emergency and should be followed up as soon as possible or go to the Emergency Department if any problems should occur.  show the CHEMOTHERAPY ALERT CARD or IMMUNOTHERAPY ALERT CARD at check-in to the Emergency Department and triage nurse.  Should you have questions after your visit or need to cancel or reschedule your appointment, please contact [facility] [phone]  and follow the prompts.  Office hours are 8:00 a.m. to 4:30 p.m. Monday - Friday. Please note that  voicemails left after 4:00 p.m. may not be returned until the following business day.  We are closed weekends and major holidays. You have access to a nurse at all times for urgent questions. Please call the main number to the clinic [phone] and follow the prompts.  For any non-urgent questions, you may also contact your provider using MyChart. We now offer e-Visits for anyone 18 and older to request care online for non-urgent symptoms. For details visit [email or web address].   Also download the MyChart app! Go to the app store, search "MyChart", open the app, select [facility], and log in with your MyChart username and password.   

## 2023-02-08 NOTE — Progress Notes (Signed)
Patient has been examined by Dr. Delton Coombes. Vital signs and labs have been reviewed by MD - ANC, Creatinine, LFTs, hemoglobin, and platelets are within treatment parameters per M.D. - pt may proceed with treatment.  Primary RN and pharmacy notified.

## 2023-02-09 NOTE — Progress Notes (Signed)
[facility] Note  02/17/2023    CHIEF COMPLAINT Patient presents for Retina Follow Up  HISTORY OF PRESENT ILLNESS: Gabrielle [name] is a 77 y.o. female who presents to the clinic today for:   HPI     Retina Follow Up   Patient presents with  Wet AMD.  In both eyes.  Severity is moderate.  Duration of 5 weeks.  Since onset it is gradually improving.  I, the attending physician,  performed the HPI with the patient and updated documentation appropriately.        Comments   Pt here for 5 wk ret f/u for exu ARMD OU. Pt states VA may have improved a bit. She feels she's seeing clearer at a distance.       Last edited by Bernarda Caffey, MD on 02/17/2023 12:58 PM.     Patient feels that the vision may be improving.   Referring physician: Janora Norlander, DO [place],  [place] [id number]  HISTORICAL INFORMATION:   Selected notes from the MEDICAL RECORD NUMBER Referred by Dr. Cristela Blue for concern of ARMD OU   CURRENT MEDICATIONS: Current Outpatient Medications (Ophthalmic Drugs)  Medication Sig   dorzolamide-timolol (COSOPT) 2-0.5 % ophthalmic solution INSTILL ONE DROP IN [facility] TWICE DAILY   dorzolamide-timolol (COSOPT) 2-0.5 % ophthalmic solution Place 1 drop into the right eye 2 (two) times daily.   No current facility-administered medications for this visit. (Ophthalmic Drugs)   Current Outpatient Medications (Other)  Medication Sig   alendronate (FOSAMAX) 70 MG tablet TAKE ONE TABLET EVERY 7 DAYS ON AN EMPTY STOMACH WITH A FULL GLASS OF WATER   amLODipine (NORVASC) 10 MG tablet Take 1 tablet (10 mg total) by mouth daily. For blood pressure   atorvastatin (LIPITOR) 20 MG tablet Take 1 tablet (20 mg total) by mouth daily. For cholesterol   cholecalciferol (VITAMIN D3) 25 MCG (1000 UT) tablet Take 1,000 Units by mouth daily.   CRANBERRY FRUIT PO Take 1 tablet by mouth daily.   diclofenac Sodium (VOLTAREN) 1 % GEL APPLY 4 GRAMS TO  AFFECTED AREA(S) 4 TIMES A DAY   ELIQUIS 5 MG TABS tablet TAKE ONE TABLET TWICE DAILY   hydrALAZINE (APRESOLINE) 25 MG tablet Take 1 tablet (25 mg total) by mouth 2 (two) times daily.   losartan-hydrochlorothiazide (HYZAAR) 100-25 MG tablet TAKE 1 TABLET DAILY   potassium chloride SA (KLOR-CON M) 20 MEQ tablet TAKE ONE TABLET THREE TIMES DAILY   spironolactone (ALDACTONE) 25 MG tablet Take 1 tablet (25 mg total) by mouth daily.   No current facility-administered medications for this visit. (Other)   REVIEW OF SYSTEMS: ROS   Positive for: Gastrointestinal, Genitourinary, Musculoskeletal, Cardiovascular, Eyes Negative for: Constitutional, Neurological, Skin, HENT, Endocrine, Respiratory, Psychiatric, Allergic/Imm, Heme/Lymph Last edited by Kingsley Spittle, COT on 02/17/2023  8:10 AM.      ALLERGIES Allergies  Allergen Reactions   Lisinopril Cough   PAST MEDICAL HISTORY Past Medical History:  Diagnosis Date   Arthritis    Cancer [facility])    Cataract    OU   Colon cancer ([place])    Family history of pancreatic cancer 08/21/2020   Family history of uterine cancer 08/21/2020   H/O cesarean section    Hx of tonsillectomy    Hyperlipidemia    Hypertension    Macular degeneration    Exu ARMD OU   Paroxysmal atrial fibrillation ([place]) 07/05/2020   Past Surgical History:  Procedure Laterality Date   BIOPSY  04/06/2020   Procedure: BIOPSY;  Surgeon: Rogene Houston, MD;  Location: [facility] END[facility];  Service: Endoscopy;;   CARPAL TUNNEL RELEASE Right    CATARACT EXTRACTION W/PHACO Left 10/11/2019   Procedure: CATARACT EXTRACTION PHACO AND INTRAOCULAR LENS PLACEMENT ([place]);  Surgeon: Baruch Goldmann, MD;  Location: [facility] ORS;  Service: Ophthalmology;  Laterality: Left;  CDE: 8.56   CATARACT EXTRACTION W/PHACO Right 10/25/2019   Procedure: CATARACT EXTRACTION PHACO AND INTRAOCULAR LENS PLACEMENT (IOC);  Surgeon: Baruch Goldmann, MD;  Location: [facility] ORS;  Service: Ophthalmology;  Laterality: Right;   CDE: 5.67   CESAREAN SECTION     COLONOSCOPY N/A 04/06/2020   Procedure: COLONOSCOPY;  Surgeon: Rogene Houston, MD;  Location: [facility] ENDO SUITE;  Service: Endoscopy;  Laterality: N/A;   CYSTOSCOPY WITH BIOPSY N/A 04/08/2020   Procedure: CYSTOSCOPY WITH BIOPSY;  Surgeon: Cleon Gustin, MD;  Location: [facility] ORS;  Service: Urology;  Laterality: N/A;   ESOPHAGOGASTRODUODENOSCOPY N/A 04/05/2020   Procedure: ESOPHAGOGASTRODUODENOSCOPY (EGD);  Surgeon: Rogene Houston, MD;  Location: [facility];  Service: Endoscopy;  Laterality: N/A;   PARTIAL COLECTOMY N/A 04/08/2020   Procedure: PARTIAL COLECTOMY;  Surgeon: Virl Cagey, MD;  Location: [facility] ORS;  Service: General;  Laterality: N/A;   PORTACATH PLACEMENT Left 05/18/2020   Procedure: INSERTION PORT-A-CATH (ATTACHED CATHETER IN LEFT SUBCLAVIAN);  Surgeon: Virl Cagey, MD;  Location: [facility] ORS;  Service: General;  Laterality: Left;   TONSILLECTOMY     FAMILY HISTORY Family History  Problem Relation Age of Onset   Macular degeneration Mother    Stroke Mother    Hypertension Mother    Dementia Mother    Lung cancer Father        dx late 67s; smoking hx   Diabetes Sister    Hypertension Sister    Leukemia Paternal Uncle        d. 59s   Cancer Maternal Aunt        ovarian or endometrial dx 23s   Pancreatic cancer Paternal Uncle        d. late 57s   SOCIAL HISTORY Social History   Tobacco Use   Smoking status: Never   Smokeless tobacco: Never  Vaping Use   Vaping Use: Never used  Substance Use Topics   Alcohol use: No   Drug use: No       OPHTHALMIC EXAM: Base Eye Exam     Visual Acuity (Snellen - Linear)       Right Left   Dist cc CF at 3' 20/60 -2   Dist ph cc NI NI    Correction: Glasses         Tonometry (Tonopen, 8:14 AM)       Right Left   Pressure 25,27 19  Put 1 gtt of brim and 1 gtt of cosopt in OD MS        Pupils       Pupils Dark Light Shape React APD   Right PERRL 3 2 Round Brisk None    Left PERRL 3 2 Round Brisk None         Visual Fields (Counting fingers)       Left Right    Full Full         Extraocular Movement       Right Left    Full, Ortho Full, Ortho         Neuro/Psych     Oriented x3:  Yes   Mood/Affect: Normal         Dilation     Both eyes: 1.0% Mydriacyl, 2.5% Phenylephrine @ 8:16 AM           Slit Lamp and Fundus Exam     Slit Lamp Exam       Right Left   Lids/Lashes Dermatochalasis - upper lid Dermatochalasis - upper lid   Conjunctiva/Sclera 1+ Injection Trace Injection   Cornea Arcus, 3+ fine Punctpate epithelial erosions Arcus, 3+ fine Punctate epithelial erosions, mild tear film debris   Anterior Chamber Deep and quiet Deep and quiet   Iris Round and well dilated Round and well dilated   Lens PCIOL in good position, 1+ Posterior capsular opacification PC IOL in good position with open PC   Anterior Vitreous Vitreous syneresis, Posterior vitreous detachment, vitreous condensations Vitreous syneresis, Posterior vitreous detachment, silicone oil micro drops         Fundus Exam       Right Left   Disc pink and sharp, compact mild pallor, sharp, Compact, vascular loops superiorly   C/D Ratio 0.2 0.2   Macula Blunted foveal reflex, Drusen, Pigment clumping and atrophy, central thickening/pigmented disciform scar, +PED, punctate IRH superior macula within atrophy, persistent trace cystic changes overlying central scar - stable Blunted foveal reflex, central thickening, RPE clumping and atrophy, Drusen, trace cystic changes overlying PED - persistent, no heme   Vessels Vascular attenuation, Tortuous Vascular attenuation, Tortuous   Periphery Attached, mild Reticular degeneration Attached, mild Reticular degeneration           Refraction     Wearing Rx       Sphere Cylinder Add   Right [place] Sphere +3.50   Left -0.50 Sphere +3.50    Type: PAL           IMAGING AND PROCEDURES  Imaging and Procedures for  03/21/18  OCT, Retina - OU - Both Eyes       Right Eye Quality was good. Central Foveal Thickness: 505. Progression has improved. Findings include no SRF, abnormal foveal contour, retinal drusen , outer retinal tubulation, subretinal hyper-reflective material, disciform scar, epiretinal membrane, intraretinal fluid, pigment epithelial detachment, outer retinal atrophy (Interval improvement in sub-retinal scar and cystic changes overlying ).   Left Eye Quality was good. Central Foveal Thickness: 294. Progression has improved. Findings include no SRF, abnormal foveal contour, retinal drusen , subretinal hyper-reflective material, intraretinal fluid, pigment epithelial detachment, outer retinal atrophy (Persistent cystic changes overlying PED/SRHM -- slightly improved).   Notes *Images captured and stored on drive  Diagnosis / Impression:  Exudative ARMD OU OD: Interval improvement in sub-retinal scar and  cystic changes overlying  OS: Persistent cystic changes overlying PED/SRHM -- slightly improved  Clinical management:  See below  Abbreviations: NFP - Normal foveal profile. CME - cystoid macular edema. PED - pigment epithelial detachment. IRF - intraretinal fluid. SRF - subretinal fluid. EZ - ellipsoid zone. ERM - epiretinal membrane. ORA - outer retinal atrophy. ORT - outer retinal tubulation. SRHM - subretinal hyper-reflective material      Intravitreal Injection, Pharmacologic Agent - OS - Left Eye       Time Out 02/17/2023. 8:32 AM. Confirmed correct patient, procedure, site, and patient consented.   Anesthesia Topical anesthesia was used. Anesthetic medications included Lidocaine 2%, Proparacaine 0.5%.   Procedure Preparation included 5% betadine to ocular surface, eyelid speculum. A (32g) needle was used.   Injection: 6 mg faricimab-svoa 6 MG/0.05ML   Route: Intravitreal,  Site: Left Eye   [place]: [id number], Lot: [id number], Expiration date: 02/01/2025, Waste: 0 mL    Post-op Post injection exam found visual acuity of at least counting fingers. The patient tolerated the procedure well. There were no complications. The patient received written and verbal post procedure care education. Post injection medications were not given.            ASSESSMENT/PLAN:    ICD-10-CM   1. Exudative age-related macular degeneration of both eyes with active choroidal neovascularization (HCC)  H35.3231 OCT, Retina - OU - Both Eyes    Intravitreal Injection, Pharmacologic Agent - OS - Left Eye    faricimab-svoa (VABYSMO) 6mg /0.38mL intravitreal injection    2. Posterior vitreous detachment of both eyes  H43.813     3. Essential hypertension  I10     4. Hypertensive retinopathy of both eyes  H35.033     5. Pseudophakia of both eyes  Z96.1     6. Ocular hypertension, bilateral  H40.053     7. Dry eyes  H04.123       1. Exudative age-related macular degeneration, both eyes - severe exudative disease with very active CNVM OU at presentation in January 2019  - S/P IVA OD #1 (01.04.19), #2 (02.15.19)  - S/P IVA OS #1 (01.18.19), #2 (02.15.19)  - switched to Eylea 3.18.19 due to severity of disease - S/P IVE OD #1 (03.18.19), #2 (04.17.19), #3 (05.15.19), #4 (10.02.19),  #5 (01.15.20), #6 (06.04.20), #7 (10.29.20), #8 (01.21.21), #9 (05.27.21), #10 (07.22.21) -- injections held due to stable disciform scar - S/P IVE OS #1 (03.18.19), #2 (04.17.19), #3 (05.15.19), #4 (06.12.19), #5 (07.10.19), #6 (08.07.19), #7 (09.04.19), #8 (10.02.19), #9 (11.06.19), #10 (12.11.19), #11 (01.15.20), #12 (02.20.20), #13 (03.26.20), #14 (04.30.20), #15 (06.04.20), #16 (07.16.20), # 17 (08.20.20), #18 (09.24.20), #19 (10.29.20), #20 (12.03.20), #21 (01.21.21), #22 (03.18.21), #23 (05.27.21), #24 (07.22.21), #25 (11.11.21), #26 (12.09.21), #27 (01.06.22), #28 (2.7.22), #29 (03.24.22), #30 (4.28.22), #31 (05.31.22), #32 (06.30.22), #33 (08.04.22), #34 (09.15.22), #35 (10.27.22), #36  (12.08.22), #37 (01.12.23), #38 (02.16.23), #39 (03.30.23), #40 (05.11.23), #41 (06.22.23), #42 (07.27.23), #43 (09.01.23), #44 (10.06.23), #45 (11.10.23), #46 (12.15.23) -- IVE resistance  - s/p IVV OS #1 01.12.24 (sample) #2 (02.09.24)  - IVE OS held from 7.22.21 to 11.11.21 due to TIA/stroke on 8.1.21 - OCT OD: OD: Interval improvement in sub-retinal scar and  cystic changes overlying  OS: Persistent cystic changes overlying PED/SRHM -- slightly improved  - exam OD with focal IRH superior macula within atrophy  - BCVA OD stable at CF 3'; OS 20/70 (improved)  - recommend IVV OS #2 today, 03.15.24 w/ f/u in 5 wks - will cont to hold OD - IVV authorization obtained - RBA of procedure discussed, questions answered - IVV informed consent obtained and signed on 01.12.24 - see procedure note   - Eylea informed consent form re-signed and scanned on 07.27.2023  - Good Days approved 12/05/2020 - 12/04/2022 -  f/u in 5 weeks, sooner prn for DFE/OCT/possible injection(s)  2. PVD / vitreous syneresis  - Discussed findings and prognosis   - No RT or RD on 360 exam  - Reviewed s/s of RT/RD  - strict return precautions for any such RT/RD symptoms  3,4. Hypertensive retinopathy OU - exam shows persistent focal IRH OD within GA - discussed importance of tight BP control - monitor  5. Pseudophakia OU  - s/p CE/IOL (Dr. Marisa Hua, 11.2020)  - beautiful surgeries w/ IOLs in excellent position, doing well  - monitor  6. Ocular Hypertension OU  - IOP 25,19 -- pt ran out of Cosopt drops  - restart cosopt BID OU -- Rx sent to pharmacy  7. Dry eyes OU - recommend artificial tears and lubricating ointment as needed  Ophthalmic Meds Ordered this visit:  Meds ordered this encounter  Medications   dorzolamide-timolol (COSOPT) 2-0.5 % ophthalmic solution    Sig: Place 1 drop into the right eye 2 (two) times daily.    Dispense:  3 mL    Refill:  11   faricimab-svoa (VABYSMO) 6mg /0.14mL intravitreal  injection     Return in about 5 weeks (around 03/24/2023) for f/u Ex. AMD OU , DFE, OCT, Possible, IVV, OS.  There are no Patient Instructions on file for this visit.  This document serves as a record of services personally performed by Gardiner Sleeper, MD, PhD. It was created on their behalf by Joetta Manners COT, an ophthalmic technician. The creation of this record is the provider's dictation and/or activities during the visit.    Electronically signed by: Joetta Manners COT 02/09/2023 3:20 AM  This document serves as a record of services personally performed by Gardiner Sleeper, MD, PhD. It was created on their behalf by Renaldo Reel, [place] an ophthalmic technician. The creation of this record is the provider's dictation and/or activities during the visit.    Electronically signed by:  Renaldo Reel, COT  03.15.24 3:20 AM  Gardiner Sleeper, M.D., Ph.D. Diseases & Surgery of the Retina and [place] 02/17/2023   I have reviewed the above documentation for accuracy and completeness, and I agree with the above. Gardiner Sleeper, M.D., Ph.D. 02/18/23 3:24 AM  Abbreviations: M myopia (nearsighted); A astigmatism; H hyperopia (farsighted); P presbyopia; Mrx spectacle prescription;  CTL contact lenses; OD right eye; OS left eye; OU both eyes  XT exotropia; ET esotropia; PEK punctate epithelial keratitis; PEE punctate epithelial erosions; DES dry eye syndrome; MGD meibomian gland dysfunction; ATs artificial tears; PFAT's preservative free artificial tears; [place] nuclear sclerotic cataract; PSC posterior subcapsular cataract; ERM epi-retinal membrane; PVD posterior vitreous detachment; RD retinal detachment; DM diabetes mellitus; DR diabetic retinopathy; NPDR non-proliferative diabetic retinopathy; PDR proliferative diabetic retinopathy; CSME clinically significant macular edema; DME diabetic macular edema; dbh dot blot hemorrhages; CWS cotton wool  spot; POAG primary open angle glaucoma; C/D cup-to-disc ratio; HVF humphrey visual field; GVF goldmann visual field; OCT optical coherence tomography; IOP intraocular pressure; BRVO Branch retinal vein occlusion; CRVO central retinal vein occlusion; CRAO central retinal artery occlusion; BRAO branch retinal artery occlusion; RT retinal tear; SB scleral buckle; PPV pars plana vitrectomy; VH Vitreous hemorrhage; PRP panretinal laser photocoagulation; IVK intravitreal kenalog; VMT vitreomacular traction; MH Macular hole;  NVD neovascularization of the disc; NVE neovascularization elsewhere; AREDS age related eye disease study; ARMD age related macular degeneration; POAG primary open angle glaucoma; EBMD epithelial/anterior basement membrane dystrophy; ACIOL anterior chamber intraocular lens; IOL intraocular lens; PCIOL posterior chamber intraocular lens; Phaco/IOL phacoemulsification with intraocular lens placement; [place] photorefractive keratectomy; LASIK laser assisted in situ keratomileusis; HTN hypertension; DM diabetes mellitus; COPD chronic obstructive pulmonary disease

## 2023-02-17 ENCOUNTER — Ambulatory Visit (INDEPENDENT_AMBULATORY_CARE_PROVIDER_SITE_OTHER): Payer: Medicare HMO | Admitting: Ophthalmology

## 2023-02-17 ENCOUNTER — Encounter (INDEPENDENT_AMBULATORY_CARE_PROVIDER_SITE_OTHER): Payer: Self-pay | Admitting: Ophthalmology

## 2023-02-17 DIAGNOSIS — Z961 Presence of intraocular lens: Secondary | ICD-10-CM

## 2023-02-17 DIAGNOSIS — H35033 Hypertensive retinopathy, bilateral: Secondary | ICD-10-CM | POA: Diagnosis not present

## 2023-02-17 DIAGNOSIS — H40053 Ocular hypertension, bilateral: Secondary | ICD-10-CM

## 2023-02-17 DIAGNOSIS — H43813 Vitreous degeneration, bilateral: Secondary | ICD-10-CM

## 2023-02-17 DIAGNOSIS — H04123 Dry eye syndrome of bilateral lacrimal glands: Secondary | ICD-10-CM | POA: Diagnosis not present

## 2023-02-17 DIAGNOSIS — H353231 Exudative age-related macular degeneration, bilateral, with active choroidal neovascularization: Secondary | ICD-10-CM

## 2023-02-17 DIAGNOSIS — I10 Essential (primary) hypertension: Secondary | ICD-10-CM | POA: Diagnosis not present

## 2023-02-17 MED ORDER — DORZOLAMIDE HCL-TIMOLOL MAL 2-0.5 % OP SOLN: 1.0000 [drp] | Freq: Two times a day (BID) | OPHTHALMIC | 11 refills | Status: DC

## 2023-02-17 MED ORDER — FARICIMAB-SVOA 6 MG/0.05ML IZ SOLN
6.0000 mg | INTRAVITREAL | Status: AC | PRN
  Administered 2023-02-17: 6 mg via INTRAVITREAL

## 2023-02-28 ENCOUNTER — Other Ambulatory Visit: Payer: Self-pay

## 2023-02-28 DIAGNOSIS — D508 Other iron deficiency anemias: Secondary | ICD-10-CM

## 2023-03-01 ENCOUNTER — Inpatient Hospital Stay: Payer: Medicare HMO

## 2023-03-01 ENCOUNTER — Ambulatory Visit: Payer: Medicare HMO | Admitting: Hematology

## 2023-03-01 ENCOUNTER — Other Ambulatory Visit: Payer: Medicare HMO

## 2023-03-01 ENCOUNTER — Ambulatory Visit: Payer: Medicare HMO

## 2023-03-01 VITALS — BP 134/52 | HR 75 | Temp 98.0°F | Resp 18

## 2023-03-01 DIAGNOSIS — Z79899 Other long term (current) drug therapy: Secondary | ICD-10-CM

## 2023-03-01 DIAGNOSIS — C182 Malignant neoplasm of ascending colon: Secondary | ICD-10-CM | POA: Diagnosis not present

## 2023-03-01 DIAGNOSIS — C189 Malignant neoplasm of colon, unspecified: Secondary | ICD-10-CM

## 2023-03-01 DIAGNOSIS — Z5112 Encounter for antineoplastic immunotherapy: Secondary | ICD-10-CM | POA: Diagnosis not present

## 2023-03-01 DIAGNOSIS — Z79620 Long term (current) use of immunosuppressive biologic: Secondary | ICD-10-CM | POA: Diagnosis not present

## 2023-03-01 DIAGNOSIS — D508 Other iron deficiency anemias: Secondary | ICD-10-CM

## 2023-03-01 LAB — COMPREHENSIVE METABOLIC PANEL
ALT: 16 U/L (ref 0–44)
AST: 18 U/L (ref 15–41)
Albumin: 4.1 g/dL (ref 3.5–5.0)
Alkaline Phosphatase: 64 U/L (ref 38–126)
Anion gap: 7 (ref 5–15)
BUN: 31 mg/dL — ABNORMAL HIGH (ref 8–23)
CO2: 24 mmol/L (ref 22–32)
Calcium: 9.5 mg/dL (ref 8.9–10.3)
Chloride: 101 mmol/L (ref 98–111)
Creatinine, Ser: 1.27 mg/dL — ABNORMAL HIGH (ref 0.44–1.00)
GFR, Estimated: 44 mL/min — ABNORMAL LOW (ref >60)
Glucose, Bld: 116 mg/dL — ABNORMAL HIGH (ref 70–99)
Potassium: 4.2 mmol/L (ref 3.5–5.1)
Sodium: 132 mmol/L — ABNORMAL LOW (ref 135–145)
Total Bilirubin: 0.7 mg/dL (ref 0.3–1.2)
Total Protein: 7.4 g/dL (ref 6.5–8.1)

## 2023-03-01 LAB — CBC WITH DIFFERENTIAL/PLATELET
Abs Immature Granulocytes: 0.04 10*3/uL (ref 0.00–0.07)
Basophils Absolute: 0 10*3/uL (ref 0.0–0.1)
Basophils Relative: 1 %
Eosinophils Absolute: 0.3 10*3/uL (ref 0.0–0.5)
Eosinophils Relative: 4 %
HCT: 35.8 % — ABNORMAL LOW (ref 36.0–46.0)
Hemoglobin: 11.9 g/dL — ABNORMAL LOW (ref 12.0–15.0)
Immature Granulocytes: 1 %
Lymphocytes Relative: 18 %
Lymphs Abs: 1.1 10*3/uL (ref 0.7–4.0)
MCH: 32 pg (ref 26.0–34.0)
MCHC: 33.2 g/dL (ref 30.0–36.0)
MCV: 96.2 fL (ref 80.0–100.0)
Monocytes Absolute: 0.6 10*3/uL (ref 0.1–1.0)
Monocytes Relative: 9 %
Neutro Abs: 4.1 10*3/uL (ref 1.7–7.7)
Neutrophils Relative %: 67 %
Platelets: 144 10*3/uL — ABNORMAL LOW (ref 150–400)
RBC: 3.72 MIL/uL — ABNORMAL LOW (ref 3.87–5.11)
RDW: 12.5 % (ref 11.5–15.5)
WBC: 6.2 10*3/uL (ref 4.0–10.5)
nRBC: 0 % (ref 0.0–0.2)

## 2023-03-01 LAB — MAGNESIUM: Magnesium: 1.8 mg/dL (ref 1.7–2.4)

## 2023-03-01 LAB — IRON AND TIBC
Iron: 65 ug/dL (ref 28–170)
Saturation Ratios: 18 % (ref 10.4–31.8)
TIBC: 371 ug/dL (ref 250–450)
UIBC: 306 ug/dL

## 2023-03-01 LAB — TSH: TSH: 2.202 u[IU]/mL (ref 0.350–4.500)

## 2023-03-01 LAB — FERRITIN: Ferritin: 52 ng/mL (ref 11–307)

## 2023-03-01 MED ORDER — SODIUM CHLORIDE 0.9 % IV SOLN
200.0000 mg | Freq: Once | INTRAVENOUS | Status: AC
  Administered 2023-03-01: 200 mg via INTRAVENOUS
  Filled 2023-03-01: qty 8

## 2023-03-01 MED ORDER — SODIUM CHLORIDE 0.9% FLUSH
10.0000 mL | INTRAVENOUS | Status: DC | PRN
  Administered 2023-03-01: 10 mL

## 2023-03-01 MED ORDER — SODIUM CHLORIDE 0.9 % IV SOLN: Freq: Once | INTRAVENOUS | Status: AC

## 2023-03-01 MED ORDER — HEPARIN SOD (PORK) LOCK FLUSH 100 UNIT/ML IV SOLN
500.0000 [IU] | Freq: Once | INTRAVENOUS | Status: AC | PRN
  Administered 2023-03-01: 500 [IU]

## 2023-03-01 NOTE — Patient Instructions (Signed)
[facility]  Discharge Instructions: Thank you for choosing [facility] to provide your oncology and hematology care.  If you have a lab appointment with the Cancer Center, please come in thru the Main Entrance and check in at the main information desk.  Wear comfortable clothing and clothing appropriate for easy access to any Portacath or PICC line.   We strive to give you quality time with your provider. You may need to reschedule your appointment if you arrive late (15 or more minutes).  Arriving late affects you and other patients whose appointments are after yours.  Also, if you miss three or more appointments without notifying the office, you may be dismissed from the clinic at the provider's discretion.      For prescription refill requests, have your pharmacy contact our office and allow 72 hours for refills to be completed.    Today you received the following chemotherapy and/or immunotherapy agents Keytruda      To help prevent nausea and vomiting after your treatment, we encourage you to take your nausea medication as directed.  BELOW ARE SYMPTOMS THAT SHOULD BE REPORTED IMMEDIATELY: *FEVER GREATER THAN 100.4 F (38 C) OR HIGHER *CHILLS OR SWEATING *NAUSEA AND VOMITING THAT IS NOT CONTROLLED WITH YOUR NAUSEA MEDICATION *UNUSUAL SHORTNESS OF BREATH *UNUSUAL BRUISING OR BLEEDING *URINARY PROBLEMS (pain or burning when urinating, or frequent urination) *BOWEL PROBLEMS (unusual diarrhea, constipation, pain near the anus) TENDERNESS IN MOUTH AND THROAT WITH OR WITHOUT PRESENCE OF ULCERS (sore throat, sores in mouth, or a toothache) UNUSUAL RASH, SWELLING OR PAIN  UNUSUAL VAGINAL DISCHARGE OR ITCHING   Items with * indicate a potential emergency and should be followed up as soon as possible or go to the Emergency Department if any problems should occur.  Please show the CHEMOTHERAPY ALERT CARD or IMMUNOTHERAPY ALERT CARD at check-in to the  Emergency Department and triage nurse.  Should you have questions after your visit or need to cancel or reschedule your appointment, please contact [facility] [phone]  and follow the prompts.  Office hours are 8:00 a.m. to 4:30 p.m. Monday - Friday. Please note that voicemails left after 4:00 p.m. may not be returned until the following business day.  We are closed weekends and major holidays. You have access to a nurse at all times for urgent questions. Please call the main number to the clinic [phone] and follow the prompts.  For any non-urgent questions, you may also contact your provider using MyChart. We now offer e-Visits for anyone 18 and older to request care online for non-urgent symptoms. For details visit [email or web address].   Also download the MyChart app! Go to the app store, search "MyChart", open the app, select [facility], and log in with your MyChart username and password.   

## 2023-03-01 NOTE — Progress Notes (Signed)
Patient presents today for Keytruda infusion per providers order.  Vital signs and labs within parameters for treatment.  Patient has no new complaints at this time.  Treatment given today per MD orders.  Stable during infusion without adverse affects.  Vital signs stable.  No complaints at this time.  Discharge from clinic ambulatory in stable condition.  Alert and oriented X 3.  Follow up with [facility] as scheduled.  

## 2023-03-03 LAB — CEA: CEA: 2.4 ng/mL (ref 0.0–4.7)

## 2023-03-06 ENCOUNTER — Other Ambulatory Visit: Payer: Self-pay | Admitting: *Deleted

## 2023-03-06 MED ORDER — POTASSIUM CHLORIDE CRYS ER 20 MEQ PO TBCR: 20.0000 meq | EXTENDED_RELEASE_TABLET | Freq: Three times a day (TID) | ORAL | 3 refills | Status: DC

## 2023-03-07 ENCOUNTER — Other Ambulatory Visit: Payer: Self-pay

## 2023-03-08 ENCOUNTER — Telehealth: Payer: Self-pay | Admitting: Family Medicine

## 2023-03-08 ENCOUNTER — Other Ambulatory Visit: Payer: Self-pay | Admitting: Family Medicine

## 2023-03-08 NOTE — Telephone Encounter (Signed)
Contacted Gabrielle [name] to schedule their annual wellness visit. Appointment made for 03/15/2023.    Thank you,  Gabrielle [name],  [facility] Direct Dial ??[phone]

## 2023-03-09 ENCOUNTER — Other Ambulatory Visit: Payer: Self-pay

## 2023-03-15 ENCOUNTER — Ambulatory Visit (INDEPENDENT_AMBULATORY_CARE_PROVIDER_SITE_OTHER): Payer: Medicare HMO

## 2023-03-15 VITALS — Ht 63.0 in | Wt 174.0 lb

## 2023-03-15 DIAGNOSIS — Z0000 Encounter for general adult medical examination without abnormal findings: Secondary | ICD-10-CM

## 2023-03-15 NOTE — Progress Notes (Signed)
Subjective:   Gabrielle [name] is a 77 y.o. female who presents for Medicare Annual (Subsequent) preventive examination. I connected with  Al Corpus on 03/15/23 by a audio enabled telemedicine application and verified that I am speaking with the correct person using two identifiers.  Patient Location: Home  Provider Location: Home Office  I discussed the limitations of evaluation and management by telemedicine. The patient expressed understanding and agreed to proceed.  Review of Systems     Cardiac Risk Factors include: advanced age (>21men, >26 women);hypertension;dyslipidemia     Objective:    Today's Vitals   03/15/23 0850  Weight: 174 lb (78.9 kg)  Height: 5\' 3"  (1.6 m)   Body mass index is 30.82 kg/m.     03/15/2023    8:53 AM 03/01/2023    1:47 PM 02/08/2023   12:37 PM 01/18/2023    1:06 PM 12/28/2022    4:09 PM 11/16/2022    1:19 PM 10/26/2022   10:28 AM  Advanced Directives  Does Patient Have a Medical Advance Directive? No Yes Yes Yes Yes Yes Yes  Type of Furniture conservator/restorer;Living will Healthcare Power of [place];Living will Healthcare Power of [place];Living will Living will;Healthcare Power of [employer] Power of [place];Living will Healthcare Power of [place];Living will  Does patient want to make changes to medical advance directive?   No - Patient declined No - Patient declined No - Patient declined  No - Patient declined  Copy of Healthcare Power of Attorney in Chart?  No - copy requested No - copy requested No - copy requested  No - copy requested No - copy requested  Would patient like information on creating a medical advance directive? No - Patient declined No - Patient declined No - Patient declined No - Patient declined No - Patient declined No - Patient declined No - Patient declined    Current Medications (verified) Outpatient Encounter Medications as of 03/15/2023  Medication Sig   alendronate (FOSAMAX)  70 MG tablet TAKE ONE TABLET EVERY 7 DAYS ON AN EMPTY STOMACH WITH A FULL GLASS OF WATER   amLODipine (NORVASC) 10 MG tablet Take 1 tablet (10 mg total) by mouth daily. For blood pressure   atorvastatin (LIPITOR) 20 MG tablet Take 1 tablet (20 mg total) by mouth daily. For cholesterol   cholecalciferol (VITAMIN D3) 25 MCG (1000 UT) tablet Take 1,000 Units by mouth daily.   CRANBERRY FRUIT PO Take 1 tablet by mouth daily.   diclofenac Sodium (VOLTAREN) 1 % GEL APPLY 4 GRAMS TO AFFECTED AREA(S) 4 TIMES A DAY   dorzolamide-timolol (COSOPT) 2-0.5 % ophthalmic solution INSTILL ONE DROP IN EACH EYE TWICE DAILY   dorzolamide-timolol (COSOPT) 2-0.5 % ophthalmic solution Place 1 drop into the right eye 2 (two) times daily.   ELIQUIS 5 MG TABS tablet TAKE ONE TABLET TWICE DAILY   hydrALAZINE (APRESOLINE) 25 MG tablet Take 1 tablet (25 mg total) by mouth 2 (two) times daily.   losartan-hydrochlorothiazide (HYZAAR) 100-25 MG tablet TAKE 1 TABLET DAILY   potassium chloride SA (KLOR-CON M) 20 MEQ tablet Take 1 tablet (20 mEq total) by mouth 3 (three) times daily.   spironolactone (ALDACTONE) 25 MG tablet Take 1 tablet (25 mg total) by mouth daily.   No facility-administered encounter medications on file as of 03/15/2023.    Allergies (verified) Lisinopril   History: Past Medical History:  Diagnosis Date   Arthritis    Cancer    Cataract  OU   Colon cancer    Family history of pancreatic cancer 08/21/2020   Family history of uterine cancer 08/21/2020   H/O cesarean section    Hx of tonsillectomy    Hyperlipidemia    Hypertension    Macular degeneration    Exu ARMD OU   Paroxysmal atrial fibrillation 07/05/2020   Past Surgical History:  Procedure Laterality Date   BIOPSY  04/06/2020   Procedure: BIOPSY;  Surgeon: Malissa Hippo, MD;  [facility];  Service: Endoscopy;;   CARPAL TUNNEL RELEASE Right    CATARACT EXTRACTION W/PHACO Left 10/11/2019   Procedure: CATARACT EXTRACTION  PHACO AND INTRAOCULAR LENS PLACEMENT (IOC);  Surgeon: Fabio Pierce, MD;  Location: [facility];  Service: Ophthalmology;  Laterality: Left;  CDE: 8.56   CATARACT EXTRACTION W/PHACO Right 10/25/2019   Procedure: CATARACT EXTRACTION PHACO AND INTRAOCULAR LENS PLACEMENT (IOC);  Surgeon: Fabio Pierce, MD;  Location: [facility];  Service: Ophthalmology;  Laterality: Right;  CDE: 5.67   CESAREAN SECTION     COLONOSCOPY N/A 04/06/2020   Procedure: COLONOSCOPY;  Surgeon: Malissa Hippo, MD;  [facility];  Service: Endoscopy;  Laterality: N/A;   CYSTOSCOPY WITH BIOPSY N/A 04/08/2020   Procedure: CYSTOSCOPY WITH BIOPSY;  Surgeon: Malen Gauze, MD;  Location: [facility];  Service: Urology;  Laterality: N/A;   ESOPHAGOGASTRODUODENOSCOPY N/A 04/05/2020   Procedure: ESOPHAGOGASTRODUODENOSCOPY (EGD);  Surgeon: Malissa Hippo, MD;  [facility];  Service: Endoscopy;  Laterality: N/A;   PARTIAL COLECTOMY N/A 04/08/2020   Procedure: PARTIAL COLECTOMY;  Surgeon: Lucretia Roers, MD;  Location: [facility];  Service: General;  Laterality: N/A;   PORTACATH PLACEMENT Left 05/18/2020   Procedure: INSERTION PORT-A-CATH (ATTACHED CATHETER IN LEFT SUBCLAVIAN);  Surgeon: Lucretia Roers, MD;  Location: [facility];  Service: General;  Laterality: Left;   TONSILLECTOMY     Family History  Problem Relation Age of Onset   Macular degeneration Mother    Stroke Mother    Hypertension Mother    Dementia Mother    Lung cancer Father        dx late 18s; smoking hx   Diabetes Sister    Hypertension Sister    Leukemia Paternal Uncle        d. 70s   Cancer Maternal Aunt        ovarian or endometrial dx 72s   Pancreatic cancer Paternal Uncle        d. late 61s   Social History   Socioeconomic History   Marital status: Divorced    Spouse name: Not on file   Number of children: Not on file   Years of education: Not on file   Highest education level: Not on file  Occupational History   Not on file   Tobacco Use   Smoking status: Never   Smokeless tobacco: Never  Vaping Use   Vaping Use: Never used  Substance and Sexual Activity   Alcohol use: No   Drug use: No   Sexual activity: Not Currently  Other Topics Concern   Not on file  Social History Narrative   Not on file   Social Determinants of Health   Financial Resource Strain: Low Risk  (03/15/2023)   Overall Financial Resource Strain (CARDIA)    Difficulty of Paying Living Expenses: Not hard at all  Food Insecurity: No Food Insecurity (03/15/2023)   Hunger Vital Sign    Worried About Running Out of Food in the Last  Year: Never true    Ran Out of Food in the Last Year: Never true  Transportation Needs: No Transportation Needs (03/15/2023)   PRAPARE - Administrator, Civil Service (Medical): No    Lack of Transportation (Non-Medical): No  Physical Activity: Sufficiently Active (03/15/2023)   Exercise Vital Sign    Days of Exercise per Week: 5 days    Minutes of Exercise per Session: 30 min  Stress: No Stress Concern Present (03/15/2023)   [employer] of Occupational Health - Occupational Stress Questionnaire    Feeling of Stress : Not at all  Social Connections: Moderately Integrated (03/15/2023)   Social Connection and Isolation Panel [NHANES]    Frequency of Communication with Friends and Family: More than three times a week    Frequency of Social Gatherings with Friends and Family: More than three times a week    Attends Religious Services: More than 4 times per year    Active Member of [employer] or Organizations: Yes    Attends Banker Meetings: More than 4 times per year    Marital Status: Widowed    Tobacco Counseling Counseling given: Not Answered   Clinical Intake:  Pre-visit preparation completed: Yes  Pain : No/denies pain     Nutritional Risks: None Diabetes: No  How often do you need to have someone help you when you read instructions, pamphlets, or other written  materials from your doctor or pharmacy?: 1 - Never  Diabetic?no   Interpreter Needed?: No  Information entered by :: Renie Ora, LPN   Activities of Daily Living    03/15/2023    8:54 AM  In your present state of health, do you have any difficulty performing the following activities:  Hearing? 0  Vision? 0  Difficulty concentrating or making decisions? 0  Walking or climbing stairs? 0  Dressing or bathing? 0  Doing errands, shopping? 0  Preparing Food and eating ? N  Using the Toilet? N  In the past six months, have you accidently leaked urine? N  Do you have problems with loss of bowel control? N  Managing your Medications? N  Managing your Finances? N  Housekeeping or managing your Housekeeping? N    Patient Care Team: [name], DO as PCP - General (Family Medicine) [name], MD as Consulting Physician (Retina Ophthalmology) Fabio Pierce, MD as Consulting Physician (Ophthalmology) West Bali, MD (Inactive) as Consulting Physician (Gastroenterology) Doreatha Massed, MD as Medical Oncologist (Oncology)  Indicate any recent Medical Services you may have received from other than [facility] providers in the past year (date may be approximate).     Assessment:   This is a routine wellness examination for Gabrielle [name].  Hearing/Vision screen Vision Screening - Comments:: Wears rx glasses - up to date with routine eye exams with  Dr.Zamore  Dietary issues and exercise activities discussed: Current Exercise Habits: Home exercise routine, Type of exercise: walking, Time (Minutes): 30, Frequency (Times/Week): 5, Weekly Exercise (Minutes/Week): 150, Intensity: Mild, Exercise limited by: None identified   Goals Addressed             This Visit's Progress    Exercise 150 min/wk Moderate Activity   On track    Continue to exercise and stay healthy.       Depression Screen    03/15/2023    8:52 AM 09/06/2022    9:05 AM 06/03/2022    9:51 AM 02/21/2022    11:17 AM 12/07/2021   10:52 AM  08/26/2020    1:26 PM 07/22/2020    2:41 PM  PHQ 2/9 Scores  PHQ - 2 Score 0 0 0 0 0 0 0  PHQ- 9 Score       0    Fall Risk    03/15/2023    8:51 AM 09/06/2022    9:05 AM 06/03/2022    9:59 AM 06/03/2022    9:51 AM 02/21/2022   11:22 AM  Fall Risk   Falls in the past year? 0 0 0 0 0  Number falls in past yr: 0    0  Injury with Fall? 0    0  Risk for fall due to : No Fall Risks    Impaired vision  Follow up Falls prevention discussed    Falls prevention discussed    FALL RISK PREVENTION PERTAINING TO THE HOME:  Any stairs in or around the home? Yes  If so, are there any without handrails? No  Home free of loose throw rugs in walkways, pet beds, electrical cords, etc? Yes  Adequate lighting in your home to reduce risk of falls? Yes   ASSISTIVE DEVICES UTILIZED TO PREVENT FALLS:  Life alert? No  Use of a cane, walker or w/c? No  Grab bars in the bathroom? Yes  Shower chair or bench in shower? Yes  Elevated toilet seat or a handicapped toilet? Yes          03/15/2023    8:54 AM 02/21/2022   11:26 AM  6CIT Screen  What Year? 0 points 0 points  What month? 0 points 0 points  What time? 0 points 0 points  Count back from 20 0 points 0 points  Months in reverse 0 points 2 points  Repeat phrase 0 points 0 points  Total Score 0 points 2 points    Immunizations Immunization History  Administered Date(s) Administered   Fluad Quad(high Dose 65+) 08/26/2020, 11/16/2021, 09/06/2022   Influenza, High Dose Seasonal PF 11/19/2019   Influenza, Seasonal, Injecte, Preservative Fre 11/19/2019   Influenza,inj,Quad PF,6+ Mos 01/25/2018   Moderna Sars-Covid-2 Vaccination 01/14/2020, 02/11/2020, 10/06/2020   Pneumococcal Conjugate-13 01/25/2018   Pneumococcal Polysaccharide-23 06/24/2019   Td 04/12/2010   Td (Adult), 2 Lf Tetanus Toxid, Preservative Free 04/12/2010   Tdap 04/12/2010   Zoster Recombinat (Shingrix) 11/19/2019, 11/19/2019, 03/20/2020,  03/20/2020    TDAP status: Up to date  Flu Vaccine status: Up to date  Pneumococcal vaccine status: Up to date  Covid-19 vaccine status: Completed vaccines  Qualifies for Shingles Vaccine? Yes   Zostavax completed Yes   Shingrix Completed?: Yes  Screening Tests Health Maintenance  Topic Date Due   DTaP/Tdap/Td (4 - Td or Tdap) 04/12/2020   COVID-19 Vaccine (4 - 2023-24 season) 08/05/2022   INFLUENZA VACCINE  07/06/2023   DEXA SCAN  07/23/2023   Medicare Annual Wellness (AWV)  03/14/2024   Pneumonia Vaccine 58+ Years old  Completed   Hepatitis C Screening  Completed   Zoster Vaccines- Shingrix  Completed   HPV VACCINES  Aged Out   COLONOSCOPY (Pts 45-4yrs Insurance coverage will need to be confirmed)  Discontinued    Health Maintenance  Health Maintenance Due  Topic Date Due   DTaP/Tdap/Td (4 - Td or Tdap) 04/12/2020   COVID-19 Vaccine (4 - 2023-24 season) 08/05/2022    Colorectal cancer screening: No longer required.   Mammogram status: No longer required due to age.  Bone Density status: Completed 07/22/2021. Results reflect: Bone density results: OSTEOPOROSIS. Repeat every  2 years.  Lung Cancer Screening: (Low Dose CT Chest recommended if Age 86-80 years, 30 pack-year currently smoking OR have quit w/in 15years.) does not qualify.   Lung Cancer Screening Referral: n/a  Additional Screening:  Hepatitis C Screening: does not qualify; Completed 04/02/2018  Vision Screening: Recommended annual ophthalmology exams for early detection of glaucoma and other disorders of the eye. Is the patient up to date with their annual eye exam?  Yes  Who is the provider or what is the name of the office in which the patient attends annual eye exams? Dr.Zamora If pt is not established with a provider, would they like to be referred to a provider to establish care? No .   Dental Screening: Recommended annual dental exams for proper oral hygiene  Community Resource Referral /  Chronic Care Management: CRR required this visit?  No   CCM required this visit?  No      Plan:     I have personally reviewed and noted the following in the patient's chart:   Medical and social history Use of alcohol, tobacco or illicit drugs  Current medications and supplements including opioid prescriptions. Patient is not currently taking opioid prescriptions. Functional ability and status Nutritional status Physical activity Advanced directives List of other physicians Hospitalizations, surgeries, and ER visits in previous 12 months Vitals Screenings to include cognitive, depression, and falls Referrals and appointments  In addition, I have reviewed and discussed with patient certain preventive protocols, quality metrics, and best practice recommendations. A written personalized care plan for preventive services as well as general preventive health recommendations were provided to patient.     Lorrene ReidLaura L Wilson, LPN   4/09/81194/09/2023   Nurse Notes: none

## 2023-03-15 NOTE — Patient Instructions (Signed)
Gabrielle [name] , Thank you for taking time to come for your Medicare Wellness Visit. I appreciate your ongoing commitment to your health goals. Please review the following plan we discussed and let me know if I can assist you in the future.   These are the goals we discussed:  Goals      Exercise 150 min/wk Moderate Activity     Continue to exercise and stay healthy.        This is a list of the screening recommended for you and due dates:  Health Maintenance  Topic Date Due   DTaP/Tdap/Td vaccine (4 - Td or Tdap) 04/12/2020   COVID-19 Vaccine (4 - 2023-24 season) 08/05/2022   Flu Shot  07/06/2023   DEXA scan (bone density measurement)  07/23/2023   Medicare Annual Wellness Visit  03/14/2024   Pneumonia Vaccine  Completed   Hepatitis C Screening: USPSTF Recommendation to screen - Ages 18-79 yo.  Completed   Zoster (Shingles) Vaccine  Completed   HPV Vaccine  Aged Out   Colon Cancer Screening  Discontinued    Advanced directives: Advance directive discussed with you today. I have provided a copy for you to complete at home and have notarized. Once this is complete please bring a copy in to our office so we can scan it into your chart.   Conditions/risks identified: Aim for 30 minutes of exercise or brisk walking, 6-8 glasses of water, and 5 servings of fruits and vegetables each day.   Next appointment: Follow up in one year for your annual wellness visit    Preventive Care 65 Years and Older, Female Preventive care refers to lifestyle choices and visits with your health care provider that can promote health and wellness. What does preventive care include? A yearly physical exam. This is also called an annual well check. Dental exams once or twice a year. Routine eye exams. Ask your health care provider how often you should have your eyes checked. Personal lifestyle choices, including: Daily care of your teeth and gums. Regular physical activity. Eating a healthy diet. Avoiding  tobacco and drug use. Limiting alcohol use. Practicing safe sex. Taking low-dose aspirin every day. Taking vitamin and mineral supplements as recommended by your health care provider. What happens during an annual well check? The services and screenings done by your health care provider during your annual well check will depend on your age, overall health, lifestyle risk factors, and family history of disease. Counseling  Your health care provider may ask you questions about your: Alcohol use. Tobacco use. Drug use. Emotional well-being. Home and relationship well-being. Sexual activity. Eating habits. History of falls. Memory and ability to understand (cognition). Work and work Astronomer. Reproductive health. Screening  You may have the following tests or measurements: Height, weight, and BMI. Blood pressure. Lipid and cholesterol levels. These may be checked every 5 years, or more frequently if you are over 57 years old. Skin check. Lung cancer screening. You may have this screening every year starting at age 42 if you have a 30-pack-year history of smoking and currently smoke or have quit within the past 15 years. Fecal occult blood test (FOBT) of the stool. You may have this test every year starting at age 32. Flexible sigmoidoscopy or colonoscopy. You may have a sigmoidoscopy every 5 years or a colonoscopy every 10 years starting at age 23. Hepatitis C blood test. Hepatitis B blood test. Sexually transmitted disease (STD) testing. Diabetes screening. This is done by checking your blood  sugar (glucose) after you have not eaten for a while (fasting). You may have this done every 1-3 years. Bone density scan. This is done to screen for osteoporosis. You may have this done starting at age 60. Mammogram. This may be done every 1-2 years. Talk to your health care provider about how often you should have regular mammograms. Talk with your health care provider about your test  results, treatment options, and if necessary, the need for more tests. Vaccines  Your health care provider may recommend certain vaccines, such as: Influenza vaccine. This is recommended every year. Tetanus, diphtheria, and acellular pertussis (Tdap, Td) vaccine. You may need a Td booster every 10 years. Zoster vaccine. You may need this after age 74. Pneumococcal 13-valent conjugate (PCV13) vaccine. One dose is recommended after age 19. Pneumococcal polysaccharide (PPSV23) vaccine. One dose is recommended after age 72. Talk to your health care provider about which screenings and vaccines you need and how often you need them. This information is not intended to replace advice given to you by your health care provider. Make sure you discuss any questions you have with your health care provider. Document Released: 12/18/2015 Document Revised: 08/10/2016 Document Reviewed: 09/22/2015 Elsevier Interactive Patient Education  2017 [place] Prevention in the Home Falls can cause injuries. They can happen to people of all ages. There are many things you can do to make your home safe and to help prevent falls. What can I do on the outside of my home? Regularly fix the edges of walkways and driveways and fix any cracks. Remove anything that might make you trip as you walk through a door, such as a raised step or threshold. Trim any bushes or trees on the path to your home. Use bright outdoor lighting. Clear any walking paths of anything that might make someone trip, such as rocks or tools. Regularly check to see if handrails are loose or broken. Make sure that both sides of any steps have handrails. Any raised decks and porches should have guardrails on the edges. Have any leaves, snow, or ice cleared regularly. Use sand or salt on walking paths during winter. Clean up any spills in your garage right away. This includes oil or grease spills. What can I do in the bathroom? Use night  lights. Install grab bars by the toilet and in the tub and shower. Do not use towel bars as grab bars. Use non-skid mats or decals in the tub or shower. If you need to sit down in the shower, use a plastic, non-slip stool. Keep the floor dry. Clean up any water that spills on the floor as soon as it happens. Remove soap buildup in the tub or shower regularly. Attach bath mats securely with double-sided non-slip rug tape. Do not have throw rugs and other things on the floor that can make you trip. What can I do in the bedroom? Use night lights. Make sure that you have a light by your bed that is easy to reach. Do not use any sheets or blankets that are too big for your bed. They should not hang down onto the floor. Have a firm chair that has side arms. You can use this for support while you get dressed. Do not have throw rugs and other things on the floor that can make you trip. What can I do in the kitchen? Clean up any spills right away. Avoid walking on wet floors. Keep items that you use a lot in easy-to-reach  places. If you need to reach something above you, use a strong step stool that has a grab bar. Keep electrical cords out of the way. Do not use floor polish or wax that makes floors slippery. If you must use wax, use non-skid floor wax. Do not have throw rugs and other things on the floor that can make you trip. What can I do with my stairs? Do not leave any items on the stairs. Make sure that there are handrails on both sides of the stairs and use them. Fix handrails that are broken or loose. Make sure that handrails are as long as the stairways. Check any carpeting to make sure that it is firmly attached to the stairs. Fix any carpet that is loose or worn. Avoid having throw rugs at the top or bottom of the stairs. If you do have throw rugs, attach them to the floor with carpet tape. Make sure that you have a light switch at the top of the stairs and the bottom of the stairs. If  you do not have them, ask someone to add them for you. What else can I do to help prevent falls? Wear shoes that: Do not have high heels. Have rubber bottoms. Are comfortable and fit you well. Are closed at the toe. Do not wear sandals. If you use a stepladder: Make sure that it is fully opened. Do not climb a closed stepladder. Make sure that both sides of the stepladder are locked into place. Ask someone to hold it for you, if possible. Clearly mark and make sure that you can see: Any grab bars or handrails. First and last steps. Where the edge of each step is. Use tools that help you move around (mobility aids) if they are needed. These include: Canes. Walkers. Scooters. Crutches. Turn on the lights when you go into a dark area. Replace any light bulbs as soon as they burn out. Set up your furniture so you have a clear path. Avoid moving your furniture around. If any of your floors are uneven, fix them. If there are any pets around you, be aware of where they are. Review your medicines with your doctor. Some medicines can make you feel dizzy. This can increase your chance of falling. Ask your doctor what other things that you can do to help prevent falls. This information is not intended to replace advice given to you by your health care provider. Make sure you discuss any questions you have with your health care provider. Document Released: 09/17/2009 Document Revised: 04/28/2016 Document Reviewed: 12/26/2014 Elsevier Interactive Patient Education  2017 [employer].

## 2023-03-17 ENCOUNTER — Other Ambulatory Visit: Payer: Self-pay

## 2023-03-22 ENCOUNTER — Inpatient Hospital Stay: Payer: Medicare HMO | Attending: Hematology

## 2023-03-22 ENCOUNTER — Inpatient Hospital Stay: Payer: Medicare HMO

## 2023-03-22 ENCOUNTER — Ambulatory Visit: Payer: Medicare HMO | Admitting: Hematology

## 2023-03-22 VITALS — BP 155/65 | HR 70 | Temp 97.6°F | Resp 18 | Wt 177.2 lb

## 2023-03-22 DIAGNOSIS — Z5112 Encounter for antineoplastic immunotherapy: Secondary | ICD-10-CM | POA: Insufficient documentation

## 2023-03-22 DIAGNOSIS — C182 Malignant neoplasm of ascending colon: Secondary | ICD-10-CM | POA: Diagnosis not present

## 2023-03-22 DIAGNOSIS — C189 Malignant neoplasm of colon, unspecified: Secondary | ICD-10-CM

## 2023-03-22 DIAGNOSIS — E876 Hypokalemia: Secondary | ICD-10-CM | POA: Insufficient documentation

## 2023-03-22 LAB — CBC WITH DIFFERENTIAL/PLATELET
Abs Immature Granulocytes: 0.02 10*3/uL (ref 0.00–0.07)
Basophils Absolute: 0 10*3/uL (ref 0.0–0.1)
Basophils Relative: 1 %
Eosinophils Absolute: 0.5 10*3/uL (ref 0.0–0.5)
Eosinophils Relative: 7 %
HCT: 38 % (ref 36.0–46.0)
Hemoglobin: 12.3 g/dL (ref 12.0–15.0)
Immature Granulocytes: 0 %
Lymphocytes Relative: 21 %
Lymphs Abs: 1.3 10*3/uL (ref 0.7–4.0)
MCH: 31.1 pg (ref 26.0–34.0)
MCHC: 32.4 g/dL (ref 30.0–36.0)
MCV: 96 fL (ref 80.0–100.0)
Monocytes Absolute: 0.7 10*3/uL (ref 0.1–1.0)
Monocytes Relative: 12 %
Neutro Abs: 3.6 10*3/uL (ref 1.7–7.7)
Neutrophils Relative %: 59 %
Platelets: 146 10*3/uL — ABNORMAL LOW (ref 150–400)
RBC: 3.96 MIL/uL (ref 3.87–5.11)
RDW: 12.5 % (ref 11.5–15.5)
WBC: 6.1 10*3/uL (ref 4.0–10.5)
nRBC: 0 % (ref 0.0–0.2)

## 2023-03-22 LAB — COMPREHENSIVE METABOLIC PANEL
ALT: 18 U/L (ref 0–44)
AST: 18 U/L (ref 15–41)
Albumin: 4 g/dL (ref 3.5–5.0)
Alkaline Phosphatase: 63 U/L (ref 38–126)
Anion gap: 10 (ref 5–15)
BUN: 33 mg/dL — ABNORMAL HIGH (ref 8–23)
CO2: 25 mmol/L (ref 22–32)
Calcium: 10 mg/dL (ref 8.9–10.3)
Chloride: 100 mmol/L (ref 98–111)
Creatinine, Ser: 1.46 mg/dL — ABNORMAL HIGH (ref 0.44–1.00)
GFR, Estimated: 37 mL/min — ABNORMAL LOW (ref >60)
Glucose, Bld: 95 mg/dL (ref 70–99)
Potassium: 4.4 mmol/L (ref 3.5–5.1)
Sodium: 135 mmol/L (ref 135–145)
Total Bilirubin: 0.7 mg/dL (ref 0.3–1.2)
Total Protein: 7.5 g/dL (ref 6.5–8.1)

## 2023-03-22 LAB — MAGNESIUM: Magnesium: 1.9 mg/dL (ref 1.7–2.4)

## 2023-03-22 MED ORDER — SODIUM CHLORIDE 0.9% FLUSH
10.0000 mL | INTRAVENOUS | Status: DC | PRN
  Administered 2023-03-22: 10 mL

## 2023-03-22 MED ORDER — HEPARIN SOD (PORK) LOCK FLUSH 100 UNIT/ML IV SOLN
500.0000 [IU] | Freq: Once | INTRAVENOUS | Status: AC | PRN
  Administered 2023-03-22: 500 [IU]

## 2023-03-22 MED ORDER — SODIUM CHLORIDE 0.9 % IV SOLN: Freq: Once | INTRAVENOUS | Status: AC

## 2023-03-22 MED ORDER — SODIUM CHLORIDE 0.9 % IV SOLN
200.0000 mg | Freq: Once | INTRAVENOUS | Status: AC
  Administered 2023-03-22: 200 mg via INTRAVENOUS
  Filled 2023-03-22: qty 8

## 2023-03-22 NOTE — Progress Notes (Signed)
Patient presents today for Keytruda infusion.  Patient is in satisfactory condition with no new complaints voiced.  Vital signs are stable.  Labs reviewed and all labs are within treatment parameters.  We will proceed with treatment per MD orders.    Patient tolerated Keytruda well with no complaints voiced.  Patient left ambulatory in stable condition.  Vital signs stable at discharge.  Follow up as scheduled.

## 2023-03-22 NOTE — Patient Instructions (Signed)
[facility]  Discharge Instructions: Thank you for choosing [facility] to provide your oncology and hematology care.  If you have a lab appointment with the Cancer Center - please note that after April 8th, 2024, all labs will be drawn in the cancer center.  You do not have to check in or register with the main entrance as you have in the past but will complete your check-in in the cancer center.  Wear comfortable clothing and clothing appropriate for easy access to any Portacath or PICC line.   We strive to give you quality time with your provider. You may need to reschedule your appointment if you arrive late (15 or more minutes).  Arriving late affects you and other patients whose appointments are after yours.  Also, if you miss three or more appointments without notifying the office, you may be dismissed from the clinic at the provider's discretion.      For prescription refill requests, have your pharmacy contact our office and allow 72 hours for refills to be completed.    Today you received the following chemotherapy and/or immunotherapy agents Keytruda.  Pembrolizumab Injection What is this medication? PEMBROLIZUMAB (PEM broe LIZ ue mab) treats some types of cancer. It works by helping your immune system slow or stop the spread of cancer cells. It is a monoclonal antibody. This medicine may be used for other purposes; ask your health care provider or pharmacist if you have questions. COMMON BRAND NAME(S): Keytruda What should I tell my care team before I take this medication? They need to know if you have any of these conditions: Allogeneic stem cell transplant (uses someone else's stem cells) Autoimmune diseases, such as Crohn disease, ulcerative colitis, lupus History of chest radiation Nervous system problems, such as Guillain-Barre syndrome, myasthenia gravis Organ transplant An unusual or allergic reaction to pembrolizumab, other medications,  foods, dyes, or preservatives Pregnant or trying to get pregnant Breast-feeding How should I use this medication? This medication is injected into a vein. It is given by your care team in a hospital or clinic setting. A special MedGuide will be given to you before each treatment. Be sure to read this information carefully each time. Talk to your care team about the use of this medication in children. While it may be prescribed for children as young as 6 months for selected conditions, precautions do apply. Overdosage: If you think you have taken too much of this medicine contact a poison control center or emergency room at once. NOTE: This medicine is only for you. Do not share this medicine with others. What if I miss a dose? Keep appointments for follow-up doses. It is important not to miss your dose. Call your care team if you are unable to keep an appointment. What may interact with this medication? Interactions have not been studied. This list may not describe all possible interactions. Give your health care provider a list of all the medicines, herbs, non-prescription drugs, or dietary supplements you use. Also tell them if you smoke, drink alcohol, or use illegal drugs. Some items may interact with your medicine. What should I watch for while using this medication? Your condition will be monitored carefully while you are receiving this medication. You may need blood work while taking this medication. This medication may cause serious skin reactions. They can happen weeks to months after starting the medication. Contact your care team right away if you notice fevers or flu-like symptoms with a rash. The   rash may be red or purple and then turn into blisters or peeling of the skin. You may also notice a red rash with swelling of the face, lips, or lymph nodes in your neck or under your arms. Tell your care team right away if you have any change in your eyesight. Talk to your care team if you  may be pregnant. Serious birth defects can occur if you take this medication during pregnancy and for 4 months after the last dose. You will need a negative pregnancy test before starting this medication. Contraception is recommended while taking this medication and for 4 months after the last dose. Your care team can help you find the option that works for you. Do not breastfeed while taking this medication and for 4 months after the last dose. What side effects may I notice from receiving this medication? Side effects that you should report to your care team as soon as possible: Allergic reactions--skin rash, itching, hives, swelling of the face, lips, tongue, or throat Dry cough, shortness of breath or trouble breathing Eye pain, redness, irritation, or discharge with blurry or decreased vision Heart muscle inflammation--unusual weakness or fatigue, shortness of breath, chest pain, fast or irregular heartbeat, dizziness, swelling of the ankles, feet, or hands Hormone gland problems--headache, sensitivity to light, unusual weakness or fatigue, dizziness, fast or irregular heartbeat, increased sensitivity to cold or heat, excessive sweating, constipation, hair loss, increased thirst or amount of urine, tremors or shaking, irritability Infusion reactions--chest pain, shortness of breath or trouble breathing, feeling faint or lightheaded Kidney injury (glomerulonephritis)--decrease in the amount of urine, red or dark Gabrielle [name] urine, foamy or bubbly urine, swelling of the ankles, hands, or feet Liver injury--right upper belly pain, loss of appetite, nausea, light-colored stool, dark yellow or Gabrielle [name] urine, yellowing skin or eyes, unusual weakness or fatigue Pain, tingling, or numbness in the hands or feet, muscle weakness, change in vision, confusion or trouble speaking, loss of balance or coordination, trouble walking, seizures Rash, fever, and swollen lymph nodes Redness, blistering, peeling, or loosening  of the skin, including inside the mouth Sudden or severe stomach pain, bloody diarrhea, fever, nausea, vomiting Side effects that usually do not require medical attention (report to your care team if they continue or are bothersome): Bone, joint, or muscle pain Diarrhea Fatigue Loss of appetite Nausea Skin rash This list may not describe all possible side effects. Call your doctor for medical advice about side effects. You may report side effects to FDA at 1-800-FDA-1088. Where should I keep my medication? This medication is given in a hospital or clinic. It will not be stored at home. NOTE: This sheet is a summary. It may not cover all possible information. If you have questions about this medicine, talk to your doctor, pharmacist, or health care provider.  2023 Elsevier/Gold Standard (2022-04-05 00:00:00)       To help prevent nausea and vomiting after your treatment, we encourage you to take your nausea medication as directed.  BELOW ARE SYMPTOMS THAT SHOULD BE REPORTED IMMEDIATELY: *FEVER GREATER THAN 100.4 F (38 C) OR HIGHER *CHILLS OR SWEATING *NAUSEA AND VOMITING THAT IS NOT CONTROLLED WITH YOUR NAUSEA MEDICATION *UNUSUAL SHORTNESS OF BREATH *UNUSUAL BRUISING OR BLEEDING *URINARY PROBLEMS (pain or burning when urinating, or frequent urination) *BOWEL PROBLEMS (unusual diarrhea, constipation, pain near the anus) TENDERNESS IN MOUTH AND THROAT WITH OR WITHOUT PRESENCE OF ULCERS (sore throat, sores in mouth, or a toothache) UNUSUAL RASH, SWELLING OR PAIN  UNUSUAL VAGINAL DISCHARGE OR ITCHING     Items with * indicate a potential emergency and should be followed up as soon as possible or go to the Emergency Department if any problems should occur.  Please show the CHEMOTHERAPY ALERT CARD or IMMUNOTHERAPY ALERT CARD at check-in to the Emergency Department and triage nurse.  Should you have questions after your visit or need to cancel or reschedule your appointment, please contact  [facility] [phone]  and follow the prompts.  Office hours are 8:00 a.m. to 4:30 p.m. Monday - Friday. Please note that voicemails left after 4:00 p.m. may not be returned until the following business day.  We are closed weekends and major holidays. You have access to a nurse at all times for urgent questions. Please call the main number to the clinic [phone] and follow the prompts.  For any non-urgent questions, you may also contact your provider using MyChart. We now offer e-Visits for anyone 18 and older to request care online for non-urgent symptoms. For details visit [email or web address].   Also download the MyChart app! Go to the app store, search "MyChart", open the app, select [facility], and log in with your MyChart username and password.   

## 2023-03-23 NOTE — Progress Notes (Signed)
[facility] - Clinic Note  03/24/2023    CHIEF COMPLAINT Patient presents for Retina Follow Up  HISTORY OF PRESENT ILLNESS: Gabrielle [name] is a 77 y.o. female who presents to the clinic today for:   HPI     Retina Follow Up   Patient presents with  Wet AMD.  In both eyes.  Severity is moderate.  Duration of 5 weeks.  Since onset it is stable.  I, the attending physician,  performed the HPI with the patient and updated documentation appropriately.        Comments   Patient states vision the same OU. Using cosopt bid OU.      Last edited by [name], MD on 03/24/2023  3:39 PM.    Patient states right eye is watering, left eye vision is not as good today  Referring physician: [name], DO [address] [place],  [place] [address]  HISTORICAL INFORMATION:   Selected notes from the MEDICAL RECORD NUMBER Referred by Dr. Mercie Eon for concern of ARMD OU   CURRENT MEDICATIONS: Current Outpatient Medications (Ophthalmic Drugs)  Medication Sig   dorzolamide-timolol (COSOPT) 2-0.5 % ophthalmic solution INSTILL ONE DROP IN [facility] EYE TWICE DAILY   dorzolamide-timolol (COSOPT) 2-0.5 % ophthalmic solution Place 1 drop into the right eye 2 (two) times daily.   No current facility-administered medications for this visit. (Ophthalmic Drugs)   Current Outpatient Medications (Other)  Medication Sig   alendronate (FOSAMAX) 70 MG tablet TAKE ONE TABLET EVERY 7 DAYS ON AN EMPTY STOMACH WITH A FULL GLASS OF WATER   amLODipine (NORVASC) 10 MG tablet Take 1 tablet (10 mg total) by mouth daily. For blood pressure   atorvastatin (LIPITOR) 20 MG tablet Take 1 tablet (20 mg total) by mouth daily. For cholesterol   cholecalciferol (VITAMIN D3) 25 MCG (1000 UT) tablet Take 1,000 Units by mouth daily.   CRANBERRY FRUIT PO Take 1 tablet by mouth daily.   diclofenac Sodium (VOLTAREN) 1 % GEL APPLY 4 GRAMS TO AFFECTED AREA(S) 4 TIMES A DAY   ELIQUIS 5 MG TABS tablet TAKE  ONE TABLET TWICE DAILY   hydrALAZINE (APRESOLINE) 25 MG tablet Take 1 tablet (25 mg total) by mouth 2 (two) times daily.   losartan-hydrochlorothiazide (HYZAAR) 100-25 MG tablet TAKE 1 TABLET DAILY   potassium chloride SA (KLOR-CON M) 20 MEQ tablet Take 1 tablet (20 mEq total) by mouth 3 (three) times daily.   spironolactone (ALDACTONE) 25 MG tablet Take 1 tablet (25 mg total) by mouth daily.   No current facility-administered medications for this visit. (Other)   REVIEW OF SYSTEMS: ROS   Positive for: Gastrointestinal, Genitourinary, Musculoskeletal, Cardiovascular, Eyes Negative for: Constitutional, Neurological, Skin, HENT, Endocrine, Respiratory, Psychiatric, Allergic/Imm, Heme/Lymph Last edited by Annalee Genta D, COT on 03/24/2023  8:08 AM.     ALLERGIES Allergies  Allergen Reactions   Lisinopril Cough   PAST MEDICAL HISTORY Past Medical History:  Diagnosis Date   Arthritis    Cancer    Cataract    OU   Colon cancer    Family history of pancreatic cancer 08/21/2020   Family history of uterine cancer 08/21/2020   H/O cesarean section    Hx of tonsillectomy    Hyperlipidemia    Hypertension    Macular degeneration    Exu ARMD OU   Paroxysmal atrial fibrillation 07/05/2020   Past Surgical History:  Procedure Laterality Date   BIOPSY  04/06/2020   Procedure: BIOPSY;  Surgeon: Malissa Hippo, MD;  [facility]: [facility];  Service: Endoscopy;;   CARPAL TUNNEL RELEASE Right    CATARACT EXTRACTION W/PHACO Left 10/11/2019   Procedure: CATARACT EXTRACTION PHACO AND INTRAOCULAR LENS PLACEMENT (IOC);  Surgeon: Fabio Pierce, MD;  Location: [facility];  Service: Ophthalmology;  Laterality: Left;  CDE: 8.56   CATARACT EXTRACTION W/PHACO Right 10/25/2019   Procedure: CATARACT EXTRACTION PHACO AND INTRAOCULAR LENS PLACEMENT (IOC);  Surgeon: Fabio Pierce, MD;  Location: [facility];  Service: Ophthalmology;  Laterality: Right;  CDE: 5.67   CESAREAN SECTION     COLONOSCOPY N/A 04/06/2020    Procedure: COLONOSCOPY;  Surgeon: Malissa Hippo, MD;  Location: [facility];  Service: Endoscopy;  Laterality: N/A;   CYSTOSCOPY WITH BIOPSY N/A 04/08/2020   Procedure: CYSTOSCOPY WITH BIOPSY;  Surgeon: Malen Gauze, MD;  Location: [facility];  Service: Urology;  Laterality: N/A;   ESOPHAGOGASTRODUODENOSCOPY N/A 04/05/2020   Procedure: ESOPHAGOGASTRODUODENOSCOPY (EGD);  Surgeon: Malissa Hippo, MD;  [facility]: [facility];  Service: Endoscopy;  Laterality: N/A;   PARTIAL COLECTOMY N/A 04/08/2020   Procedure: PARTIAL COLECTOMY;  Surgeon: Lucretia Roers, MD;  Location: [facility];  Service: General;  Laterality: N/A;   PORTACATH PLACEMENT Left 05/18/2020   Procedure: INSERTION PORT-A-CATH (ATTACHED CATHETER IN LEFT SUBCLAVIAN);  Surgeon: Lucretia Roers, MD;  Location: [facility];  Service: General;  Laterality: Left;   TONSILLECTOMY     FAMILY HISTORY Family History  Problem Relation Age of Onset   Macular degeneration Mother    Stroke Mother    Hypertension Mother    Dementia Mother    Lung cancer Father        dx late 30s; smoking hx   Diabetes Sister    Hypertension Sister    Leukemia Paternal Uncle        d. 20s   Cancer Maternal Aunt        ovarian or endometrial dx 63s   Pancreatic cancer Paternal Uncle        d. late 6s   SOCIAL HISTORY Social History   Tobacco Use   Smoking status: Never   Smokeless tobacco: Never  Vaping Use   Vaping Use: Never used  Substance Use Topics   Alcohol use: No   Drug use: No       OPHTHALMIC EXAM: Base Eye Exam     Visual Acuity (Snellen - Linear)       Right Left   Dist cc CF at 3' 20/80 +1   Dist ph cc NI NI    Correction: Glasses         Tonometry (Tonopen, 8:13 AM)       Right Left   Pressure 21 17         Pupils       Dark Light Shape React APD   Right 3 2 Round Brisk None   Left 3 2 Round Brisk None         Visual Fields (Counting fingers)       Left Right    Full Full         Extraocular  Movement       Right Left    Full, Ortho Full, Ortho         Neuro/Psych     Oriented x3: Yes   Mood/Affect: Normal         Dilation     Both eyes: 1.0% Mydriacyl, 2.5% Phenylephrine @ 8:13 AM  Slit Lamp and Fundus Exam     Slit Lamp Exam       Right Left   Lids/Lashes Dermatochalasis - upper lid Dermatochalasis - upper lid   Conjunctiva/Sclera 1+ Injection Trace Injection   Cornea Arcus, 3+ fine Punctpate epithelial erosions Arcus, 3+ fine Punctate epithelial erosions, mild tear film debris   Anterior Chamber Deep and quiet Deep and quiet   Iris Round and well dilated Round and well dilated   Lens PCIOL in good position, 1+ Posterior capsular opacification PC IOL in good position with open PC   Anterior Vitreous Vitreous syneresis, Posterior vitreous detachment, vitreous condensations Vitreous syneresis, Posterior vitreous detachment, silicone oil micro drops         Fundus Exam       Right Left   Disc pink and sharp, compact mild pallor, sharp, Compact, vascular loops superiorly   C/D Ratio 0.2 0.2   Macula Blunted foveal reflex, Drusen, Pigment clumping and atrophy, central thickening/pigmented disciform scar, +PED, punctate IRH superior macula within atrophy, persistent trace cystic changes overlying central scar - stable Blunted foveal reflex, central thickening, RPE clumping and atrophy, Drusen, trace cystic changes overlying PED - persistent, no heme   Vessels Vascular attenuation, Tortuous Vascular attenuation, Tortuous   Periphery Attached, mild Reticular degeneration Attached, mild Reticular degeneration           Refraction     Wearing Rx       Sphere Cylinder Add   Right [place] Sphere +3.50   Left -0.50 Sphere +3.50    Type: PAL           IMAGING AND PROCEDURES  Imaging and Procedures for 03/21/18  OCT, Retina - OU - Both Eyes       Right Eye Quality was good. Central Foveal Thickness: 499. Progression has been stable.  Findings include no SRF, abnormal foveal contour, retinal drusen , outer retinal tubulation, subretinal hyper-reflective material, disciform scar, epiretinal membrane, intraretinal fluid, pigment epithelial detachment, outer retinal atrophy (Stable improvement in sub-retinal scar and cystic changes overlying ).   Left Eye Quality was good. Central Foveal Thickness: 279. Progression has improved. Findings include no SRF, abnormal foveal contour, retinal drusen , subretinal hyper-reflective material, intraretinal fluid, pigment epithelial detachment, outer retinal atrophy (Mild interval improvement in cystic changes overlying PED/SRHM ).   Notes *Images captured and stored on drive  Diagnosis / Impression:  Exudative ARMD OU OD: Stable improvement in sub-retinal scar and  cystic changes overlying  OS: mild interval improvement in cystic changes overlying PED/SRHM  Clinical management:  See below  Abbreviations: NFP - Normal foveal profile. CME - cystoid macular edema. PED - pigment epithelial detachment. IRF - intraretinal fluid. SRF - subretinal fluid. EZ - ellipsoid zone. ERM - epiretinal membrane. ORA - outer retinal atrophy. ORT - outer retinal tubulation. SRHM - subretinal hyper-reflective material      Intravitreal Injection, Pharmacologic Agent - OS - Left Eye       Time Out 03/24/2023. 8:13 AM. Confirmed correct patient, procedure, site, and patient consented.   Anesthesia Topical anesthesia was used. Anesthetic medications included Lidocaine 2%, Proparacaine 0.5%.   Procedure Preparation included 5% betadine to ocular surface, eyelid speculum. A (32g) needle was used.   Injection: 6 mg faricimab-svoa 6 MG/0.05ML   Route: Intravitreal, Site: Left Eye   NDC: O8010301, Lot: [id number], Expiration date: 03/04/2025, Waste: 0 mL   Post-op Post injection exam found visual acuity of at least counting fingers. The patient tolerated the procedure well.  There were no complications.  The patient received written and verbal post procedure care education. Post injection medications were not given.            ASSESSMENT/PLAN:    ICD-10-CM   1. Exudative age-related macular degeneration of both eyes with active choroidal neovascularization  H35.3231 OCT, Retina - OU - Both Eyes    Intravitreal Injection, Pharmacologic Agent - OS - Left Eye    faricimab-svoa (VABYSMO) 6mg /0.39mL intravitreal injection    2. Posterior vitreous detachment of both eyes  H43.813     3. Essential hypertension  I10     4. Hypertensive retinopathy of both eyes  H35.033     5. Pseudophakia of both eyes  Z96.1     6. Ocular hypertension, bilateral  H40.053     7. Dry eyes  H04.123        1. Exudative age-related macular degeneration, both eyes - severe exudative disease with very active CNVM OU at presentation in January 2019  - S/P IVA OD #1 (01.04.19), #2 (02.15.19)  - S/P IVA OS #1 (01.18.19), #2 (02.15.19)  - switched to Eylea 3.18.19 due to severity of disease - S/P IVE OD #1 (03.18.19), #2 (04.17.19), #3 (05.15.19), #4 (10.02.19),  #5 (01.15.20), #6 (06.04.20), #7 (10.29.20), #8 (01.21.21), #9 (05.27.21), #10 (07.22.21) -- injections held due to stable disciform scar - S/P IVE OS #1 (03.18.19), #2 (04.17.19), #3 (05.15.19), #4 (06.12.19), #5 (07.10.19), #6 (08.07.19), #7 (09.04.19), #8 (10.02.19), #9 (11.06.19), #10 (12.11.19), #11 (01.15.20), #12 (02.20.20), #13 (03.26.20), #14 (04.30.20), #15 (06.04.20), #16 (07.16.20), # 17 (08.20.20), #18 (09.24.20), #19 (10.29.20), #20 (12.03.20), #21 (01.21.21), #22 (03.18.21), #23 (05.27.21), #24 (07.22.21), #25 (11.11.21), #26 (12.09.21), #27 (01.06.22), #28 (2.7.22), #29 (03.24.22), #30 (4.28.22), #31 (05.31.22), #32 (06.30.22), #33 (08.04.22), #34 (09.15.22), #35 (10.27.22), #36 (12.08.22), #37 (01.12.23), #38 (02.16.23), #39 (03.30.23), #40 (05.11.23), #41 (06.22.23), #42 (07.27.23), #43 (09.01.23), #44 (10.06.23), #45 (11.10.23), #46  (12.15.23) -- IVE resistance  - s/p IVV OS #1 (01.12.24 -- sample) #2 (02.09.24) #3 (03.15.24)  - IVE OS held from 7.22.21 to 11.11.21 due to TIA/stroke on 8.1.21 - OCT OD: Stable improvement in sub-retinal scar and  cystic changes overlying; OS: mild interval improvement in cystic changes overlying PED/SRHM  - exam OD with focal IRH superior macula within atrophy  - BCVA OD stable at CF 3'; OS 20/80 (decreased)  - recommend IVV OS #4 today, 04.19.24 w/ f/u in 5 wks - will cont to hold OD - IVV authorization obtained - RBA of procedure discussed, questions answered - IVV informed consent obtained and signed on 01.12.24 - see procedure note   - Eylea informed consent form re-signed and scanned on 07.27.2023  - Good Days approved 12/05/2020 - 12/04/2022 - f/u in 5 weeks, sooner prn for DFE/OCT/possible injection(s)  2. PVD / vitreous syneresis  - Discussed findings and prognosis   - No RT or RD on 360 exam  - Reviewed s/s of RT/RD  - strict return precautions for any such RT/RD symptoms  3,4. Hypertensive retinopathy OU - exam shows persistent focal IRH OD within GA - discussed importance of tight BP control - monitor  5. Pseudophakia OU  - s/p CE/IOL (Dr. June Leap, 11.2020)  - beautiful surgeries w/ IOLs in excellent position, doing well  - monitor  6. Ocular Hypertension OU  - IOP 21,17  - cont cosopt BID OU   7. Dry eyes OU - recommend artificial tears and lubricating ointment as needed  Ophthalmic Meds Ordered this visit:  Meds ordered this encounter  Medications   faricimab-svoa (VABYSMO) 6mg /0.61mL intravitreal injection     Return in about 5 weeks (around 04/28/2023) for f/u exu ARMD OS, DFE, OCT.  There are no Patient Instructions on file for this visit.  This document serves as a record of services personally performed by Karie Chimera, MD, PhD. It was created on their behalf by Berlin Hun COT, The creation of this record is the provider's dictation  and/or activities during the visit.    Electronically signed by: Berlin Hun COT 03/23/2023 2:11 AM  This document serves as a record of services personally performed by Karie Chimera, MD, PhD. It was created on their behalf by Glee Arvin. Manson Passey, OA an ophthalmic technician. The creation of this record is the provider's dictation and/or activities during the visit.    Electronically signed by: Glee Arvin. Manson Passey, [place] 04.19.2024 2:11 AM  Karie Chimera, M.D., Ph.D. Diseases & Surgery of the Retina and Vitreous [facility] 03/24/2023   I have reviewed the above documentation for accuracy and completeness, and I agree with the above. Karie Chimera, M.D., Ph.D. 03/27/23 2:12 AM  Abbreviations: M myopia (nearsighted); A astigmatism; H hyperopia (farsighted); P presbyopia; Mrx spectacle prescription;  CTL contact lenses; OD right eye; OS left eye; OU both eyes  XT exotropia; ET esotropia; PEK punctate epithelial keratitis; PEE punctate epithelial erosions; DES dry eye syndrome; MGD meibomian gland dysfunction; ATs artificial tears; PFAT's preservative free artificial tears; NSC nuclear sclerotic cataract; PSC posterior subcapsular cataract; ERM epi-retinal membrane; PVD posterior vitreous detachment; RD retinal detachment; DM diabetes mellitus; DR diabetic retinopathy; NPDR non-proliferative diabetic retinopathy; PDR proliferative diabetic retinopathy; CSME clinically significant macular edema; DME diabetic macular edema; dbh dot blot hemorrhages; CWS cotton wool spot; POAG primary open angle glaucoma; C/D cup-to-disc ratio; HVF humphrey visual field; GVF goldmann visual field; OCT optical coherence tomography; IOP intraocular pressure; BRVO Branch retinal vein occlusion; CRVO central retinal vein occlusion; CRAO central retinal artery occlusion; BRAO branch retinal artery occlusion; RT retinal tear; SB scleral buckle; PPV pars plana vitrectomy; VH Vitreous hemorrhage; PRP  panretinal laser photocoagulation; IVK intravitreal kenalog; VMT vitreomacular traction; MH Macular hole;  NVD neovascularization of the disc; NVE neovascularization elsewhere; AREDS age related eye disease study; ARMD age related macular degeneration; POAG primary open angle glaucoma; EBMD epithelial/anterior basement membrane dystrophy; ACIOL anterior chamber intraocular lens; IOL intraocular lens; PCIOL posterior chamber intraocular lens; Phaco/IOL phacoemulsification with intraocular lens placement; PRK photorefractive keratectomy; LASIK laser assisted in situ keratomileusis; HTN hypertension; DM diabetes mellitus; COPD chronic obstructive pulmonary disease

## 2023-03-24 ENCOUNTER — Ambulatory Visit (INDEPENDENT_AMBULATORY_CARE_PROVIDER_SITE_OTHER): Payer: Medicare HMO | Admitting: Ophthalmology

## 2023-03-24 ENCOUNTER — Encounter (INDEPENDENT_AMBULATORY_CARE_PROVIDER_SITE_OTHER): Payer: Self-pay | Admitting: Ophthalmology

## 2023-03-24 ENCOUNTER — Other Ambulatory Visit: Payer: Self-pay | Admitting: Family Medicine

## 2023-03-24 DIAGNOSIS — H35033 Hypertensive retinopathy, bilateral: Secondary | ICD-10-CM | POA: Diagnosis not present

## 2023-03-24 DIAGNOSIS — H40053 Ocular hypertension, bilateral: Secondary | ICD-10-CM

## 2023-03-24 DIAGNOSIS — Z961 Presence of intraocular lens: Secondary | ICD-10-CM

## 2023-03-24 DIAGNOSIS — H353231 Exudative age-related macular degeneration, bilateral, with active choroidal neovascularization: Secondary | ICD-10-CM | POA: Diagnosis not present

## 2023-03-24 DIAGNOSIS — I10 Essential (primary) hypertension: Secondary | ICD-10-CM

## 2023-03-24 DIAGNOSIS — H43813 Vitreous degeneration, bilateral: Secondary | ICD-10-CM | POA: Diagnosis not present

## 2023-03-24 DIAGNOSIS — H04123 Dry eye syndrome of bilateral lacrimal glands: Secondary | ICD-10-CM

## 2023-03-24 MED ORDER — FARICIMAB-SVOA 6 MG/0.05ML IZ SOLN
6.0000 mg | INTRAVITREAL | Status: AC | PRN
Start: 2023-03-24 — End: 2023-03-24
  Administered 2023-03-24: 6 mg via INTRAVITREAL

## 2023-03-28 ENCOUNTER — Telehealth: Payer: Self-pay | Admitting: Family Medicine

## 2023-03-28 NOTE — Telephone Encounter (Signed)
  Incoming Patient Call  03/28/2023  What symptoms do you have? Blood in urine   How long have you been sick? Started this morning   Have you been seen for this problem? No pt has appt on 03/30/2023, this was first available, pt is not able to do a video visit, wants to know what she should do in the meantime until she is seen  If your provider decides to give you a prescription, which pharmacy would you like for it to be sent to?  [facility]   Patient informed that this information will be sent to the clinical staff for review and that they should receive a follow up call.

## 2023-03-28 NOTE — Telephone Encounter (Signed)
Patient aware and verbalizes understanding. 

## 2023-03-30 ENCOUNTER — Ambulatory Visit (INDEPENDENT_AMBULATORY_CARE_PROVIDER_SITE_OTHER): Payer: Medicare HMO | Admitting: Family Medicine

## 2023-03-30 ENCOUNTER — Encounter: Payer: Self-pay | Admitting: Family Medicine

## 2023-03-30 VITALS — BP 149/66 | HR 77 | Temp 97.2°F | Ht 63.0 in | Wt 179.6 lb

## 2023-03-30 DIAGNOSIS — N3001 Acute cystitis with hematuria: Secondary | ICD-10-CM

## 2023-03-30 DIAGNOSIS — R310 Gross hematuria: Secondary | ICD-10-CM

## 2023-03-30 DIAGNOSIS — R319 Hematuria, unspecified: Secondary | ICD-10-CM | POA: Diagnosis not present

## 2023-03-30 LAB — URINALYSIS, ROUTINE W REFLEX MICROSCOPIC
Bilirubin, UA: NEGATIVE
Glucose, UA: NEGATIVE
Ketones, UA: NEGATIVE
Nitrite, UA: NEGATIVE
Protein,UA: NEGATIVE
Specific Gravity, UA: 1.01 (ref 1.005–1.030)
Urobilinogen, Ur: 0.2 mg/dL (ref 0.2–1.0)
pH, UA: 7 (ref 5.0–7.5)

## 2023-03-30 MED ORDER — SULFAMETHOXAZOLE-TRIMETHOPRIM 800-160 MG PO TABS
1.0000 | ORAL_TABLET | Freq: Two times a day (BID) | ORAL | 0 refills | Status: AC
Start: 2023-03-30 — End: 2023-04-06

## 2023-03-30 NOTE — Progress Notes (Signed)
Subjective:  Patient ID: Gabrielle [name], female    DOB: Sep 05, 1946, 77 y.o.   MRN: [id number]  Patient Care Team: [name], DO as PCP - General (Family Medicine) [name], MD as Consulting Physician (Retina Ophthalmology) Fabio Pierce, MD as Consulting Physician (Ophthalmology) West Bali, MD (Inactive) as Consulting Physician (Gastroenterology) Doreatha Massed, MD as Medical Oncologist (Oncology)   Chief Complaint:  Hematuria (Noticed blood in urine on Tuesday only.)   HPI: Gabrielle [name] is a 77 y.o. female presenting on 03/30/2023 for Hematuria (Noticed blood in urine on Tuesday only.)   Hematuria This is a new problem. The current episode started yesterday. The problem has been resolved since onset. She describes the hematuria as gross hematuria. The hematuria occurs during the initial portion of her urinary stream. She reports no clotting in her urine stream. She is experiencing no pain. Irritative symptoms do not include frequency, nocturia or urgency. Obstructive symptoms do not include dribbling, incomplete emptying, an intermittent stream, a slower stream, straining or a weak stream. Pertinent negatives include no abdominal pain, bladder pain, bone pain, chills, dysuria, facial swelling, fever, flank pain, genital pain, hesitancy, inability to urinate, nausea, urinary retention, vomiting or weight loss. She is not sexually active. Risk factors include anticoagulant.     Relevant past medical, surgical, family, and social history reviewed and updated as indicated.  Allergies and medications reviewed and updated. Data reviewed: Chart in Epic.   Past Medical History:  Diagnosis Date   Arthritis    Cancer    Cataract    OU   Colon cancer    Family history of pancreatic cancer 08/21/2020   Family history of uterine cancer 08/21/2020   H/O cesarean section    Hx of tonsillectomy    Hyperlipidemia    Hypertension    Macular degeneration     Exu ARMD OU   Paroxysmal atrial fibrillation 07/05/2020    Past Surgical History:  Procedure Laterality Date   BIOPSY  04/06/2020   Procedure: BIOPSY;  Surgeon: Malissa Hippo, MD;  Location: [facility];  Service: Endoscopy;;   CARPAL TUNNEL RELEASE Right    CATARACT EXTRACTION W/PHACO Left 10/11/2019   Procedure: CATARACT EXTRACTION PHACO AND INTRAOCULAR LENS PLACEMENT (IOC);  Surgeon: Fabio Pierce, MD;  Location: [facility];  Service: Ophthalmology;  Laterality: Left;  CDE: 8.56   CATARACT EXTRACTION W/PHACO Right 10/25/2019   Procedure: CATARACT EXTRACTION PHACO AND INTRAOCULAR LENS PLACEMENT (IOC);  Surgeon: Fabio Pierce, MD;  Location: [facility];  Service: Ophthalmology;  Laterality: Right;  CDE: 5.67   CESAREAN SECTION     COLONOSCOPY N/A 04/06/2020   Procedure: COLONOSCOPY;  Surgeon: Malissa Hippo, MD;  Location: [facility];  Service: Endoscopy;  Laterality: N/A;   CYSTOSCOPY WITH BIOPSY N/A 04/08/2020   Procedure: CYSTOSCOPY WITH BIOPSY;  Surgeon: Malen Gauze, MD;  Location: [facility];  Service: Urology;  Laterality: N/A;   ESOPHAGOGASTRODUODENOSCOPY N/A 04/05/2020   Procedure: ESOPHAGOGASTRODUODENOSCOPY (EGD);  Surgeon: Malissa Hippo, MD;  Location: [facility];  Service: Endoscopy;  Laterality: N/A;   PARTIAL COLECTOMY N/A 04/08/2020   Procedure: PARTIAL COLECTOMY;  Surgeon: Lucretia Roers, MD;  Location: [facility];  Service: General;  Laterality: N/A;   PORTACATH PLACEMENT Left 05/18/2020   Procedure: INSERTION PORT-A-CATH (ATTACHED CATHETER IN LEFT SUBCLAVIAN);  Surgeon: Lucretia Roers, MD;  Location: [facility];  Service: General;  Laterality: Left;   TONSILLECTOMY      Social History  Socioeconomic History   Marital status: Divorced    Spouse name: Not on file   Number of children: Not on file   Years of education: Not on file   Highest education level: Not on file  Occupational History   Not on file  Tobacco Use   Smoking status: Never   Smokeless  tobacco: Never  Vaping Use   Vaping Use: Never used  Substance and Sexual Activity   Alcohol use: No   Drug use: No   Sexual activity: Not Currently  Other Topics Concern   Not on file  Social History Narrative   Not on file   Social Determinants of Health   Financial Resource Strain: Low Risk  (03/15/2023)   Overall Financial Resource Strain (CARDIA)    Difficulty of Paying Living Expenses: Not hard at all  Food Insecurity: No Food Insecurity (03/15/2023)   Hunger Vital Sign    Worried About Running Out of Food in the Last Year: Never true    Ran Out of Food in the Last Year: Never true  Transportation Needs: No Transportation Needs (03/15/2023)   PRAPARE - Administrator, Civil Service (Medical): No    Lack of Transportation (Non-Medical): No  Physical Activity: Sufficiently Active (03/15/2023)   Exercise Vital Sign    Days of Exercise per Week: 5 days    Minutes of Exercise per Session: 30 min  Stress: No Stress Concern Present (03/15/2023)   [employer] of Occupational Health - Occupational Stress Questionnaire    Feeling of Stress : Not at all  Social Connections: Moderately Integrated (03/15/2023)   Social Connection and Isolation Panel [NHANES]    Frequency of Communication with Friends and Family: More than three times a week    Frequency of Social Gatherings with Friends and Family: More than three times a week    Attends Religious Services: More than 4 times per year    Active Member of [employer] or Organizations: Yes    Attends Banker Meetings: More than 4 times per year    Marital Status: Widowed  Intimate Partner Violence: Not At Risk (03/15/2023)   Humiliation, Afraid, Rape, and Kick questionnaire    Fear of Current or Ex-Partner: No    Emotionally Abused: No    Physically Abused: No    Sexually Abused: No    Outpatient Encounter Medications as of 03/30/2023  Medication Sig   alendronate (FOSAMAX) 70 MG tablet TAKE ONE TABLET EVERY 7  DAYS ON AN EMPTY STOMACH WITH A FULL GLASS OF WATER   amLODipine (NORVASC) 10 MG tablet Take 1 tablet (10 mg total) by mouth daily. For blood pressure   atorvastatin (LIPITOR) 20 MG tablet Take 1 tablet (20 mg total) by mouth daily. For cholesterol   cholecalciferol (VITAMIN D3) 25 MCG (1000 UT) tablet Take 1,000 Units by mouth daily.   CRANBERRY FRUIT PO Take 1 tablet by mouth daily.   diclofenac Sodium (VOLTAREN) 1 % GEL APPLY 4 GRAMS TO AFFECTED AREA(S) 4 TIMES A DAY   dorzolamide-timolol (COSOPT) 2-0.5 % ophthalmic solution INSTILL ONE DROP IN EACH EYE TWICE DAILY   dorzolamide-timolol (COSOPT) 2-0.5 % ophthalmic solution Place 1 drop into the right eye 2 (two) times daily.   ELIQUIS 5 MG TABS tablet TAKE ONE TABLET TWICE DAILY   hydrALAZINE (APRESOLINE) 25 MG tablet Take 1 tablet (25 mg total) by mouth 2 (two) times daily.   losartan-hydrochlorothiazide (HYZAAR) 100-25 MG tablet Take 1 tablet by mouth daily. (NEEDS  TO BE SEEN BEFORE NEXT REFILL)   potassium chloride SA (KLOR-CON M) 20 MEQ tablet Take 1 tablet (20 mEq total) by mouth 3 (three) times daily.   spironolactone (ALDACTONE) 25 MG tablet Take 1 tablet (25 mg total) by mouth daily.   sulfamethoxazole-trimethoprim (BACTRIM DS) 800-160 MG tablet Take 1 tablet by mouth 2 (two) times daily for 7 days.   No facility-administered encounter medications on file as of 03/30/2023.    Allergies  Allergen Reactions   Lisinopril Cough    Review of Systems  Constitutional:  Negative for activity change, appetite change, chills, fatigue, fever and weight loss.  HENT: Negative.  Negative for facial swelling.   Eyes: Negative.   Respiratory:  Negative for cough, chest tightness and shortness of breath.   Cardiovascular:  Negative for chest pain, palpitations and leg swelling.  Gastrointestinal:  Negative for abdominal pain, blood in stool, constipation, diarrhea, nausea and vomiting.  Endocrine: Negative.   Genitourinary:  Positive for  hematuria. Negative for decreased urine volume, difficulty urinating, dysuria, enuresis, flank pain, frequency, genital sores, hesitancy, incomplete emptying, nocturia, pelvic pain, urgency, vaginal bleeding, vaginal discharge and vaginal pain.  Musculoskeletal:  Negative for arthralgias, back pain and myalgias.  Skin: Negative.   Allergic/Immunologic: Negative.   Neurological:  Negative for dizziness and headaches.  Hematological: Negative.   Psychiatric/Behavioral:  Negative for confusion, hallucinations, sleep disturbance and suicidal ideas.   All other systems reviewed and are negative.       Objective:  BP (!) 149/66   Pulse 77   Temp (!) 97.2 F (36.2 C) (Temporal)   Ht 5\' 3"  (1.6 m)   Wt 179 lb 9.6 oz (81.5 kg)   SpO2 97%   BMI 31.81 kg/m    Wt Readings from Last 3 Encounters:  03/30/23 179 lb 9.6 oz (81.5 kg)  03/22/23 177 lb 3.2 oz (80.4 kg)  03/15/23 174 lb (78.9 kg)    Physical Exam Vitals and nursing note reviewed.  Constitutional:      General: She is not in acute distress.    Appearance: Normal appearance. She is well-developed and well-groomed. She is obese. She is not ill-appearing, toxic-appearing or diaphoretic.  HENT:     Head: Normocephalic and atraumatic.     Jaw: There is normal jaw occlusion.     Right Ear: Hearing normal.     Left Ear: Hearing normal.     Nose: Nose normal.     Mouth/Throat:     Lips: Pink.     Mouth: Mucous membranes are moist.     Pharynx: Oropharynx is clear. Uvula midline.  Eyes:     General: Lids are normal.     Extraocular Movements: Extraocular movements intact.     Conjunctiva/sclera: Conjunctivae normal.     Pupils: Pupils are equal, round, and reactive to light.  Neck:     Thyroid: No thyroid mass, thyromegaly or thyroid tenderness.     Vascular: No carotid bruit or JVD.     Trachea: Trachea and phonation normal.  Cardiovascular:     Rate and Rhythm: Normal rate and regular rhythm.     Chest Wall: PMI is not  displaced.     Pulses: Normal pulses.     Heart sounds: Normal heart sounds. No murmur heard.    No friction rub. No gallop.  Pulmonary:     Effort: Pulmonary effort is normal. No respiratory distress.     Breath sounds: Normal breath sounds. No wheezing.  Abdominal:  General: Bowel sounds are normal. There is no distension or abdominal bruit.     Palpations: Abdomen is soft. There is no hepatomegaly, splenomegaly or mass.     Tenderness: There is no abdominal tenderness. There is no right CVA tenderness, left CVA tenderness, guarding or rebound.     Hernia: No hernia is present.  Musculoskeletal:        General: Normal range of motion.     Cervical back: Normal range of motion and neck supple.     Right lower leg: No edema.     Left lower leg: No edema.  Lymphadenopathy:     Cervical: No cervical adenopathy.  Skin:    General: Skin is warm and dry.     Capillary Refill: Capillary refill takes less than 2 seconds.     Coloration: Skin is not cyanotic, jaundiced or pale.     Findings: No rash.  Neurological:     General: No focal deficit present.     Mental Status: She is alert and oriented to person, place, and time.     Sensory: Sensation is intact.     Motor: Motor function is intact.     Coordination: Coordination is intact.     Gait: Gait is intact.     Deep Tendon Reflexes: Reflexes are normal and symmetric.  Psychiatric:        Attention and Perception: Attention and perception normal.        Mood and Affect: Mood and affect normal.        Speech: Speech normal.        Behavior: Behavior normal. Behavior is cooperative.        Thought Content: Thought content normal.        Cognition and Memory: Cognition and memory normal.        Judgment: Judgment normal.     Results for orders placed or performed in visit on 03/22/23  Comprehensive metabolic panel  Result Value Ref Range   Sodium 135 135 - 145 mmol/L   Potassium 4.4 3.5 - 5.1 mmol/L   Chloride 100 98 - 111  mmol/L   CO2 25 22 - 32 mmol/L   Glucose, Bld 95 70 - 99 mg/dL   BUN 33 (H) 8 - 23 mg/dL   Creatinine, Ser 8.29 (H) 0.44 - 1.00 mg/dL   Calcium 56.2 8.9 - 13.0 mg/dL   Total Protein 7.5 6.5 - 8.1 g/dL   Albumin 4.0 3.5 - 5.0 g/dL   AST 18 15 - 41 U/L   ALT 18 0 - 44 U/L   Alkaline Phosphatase 63 38 - 126 U/L   Total Bilirubin 0.7 0.3 - 1.2 mg/dL   GFR, Estimated 37 (L) >60 mL/min   Anion gap 10 5 - 15  CBC with Differential  Result Value Ref Range   WBC 6.1 4.0 - 10.5 K/uL   RBC 3.96 3.87 - 5.11 MIL/uL   Hemoglobin 12.3 12.0 - 15.0 g/dL   HCT 86.5 78.4 - 69.6 %   MCV 96.0 80.0 - 100.0 fL   MCH 31.1 26.0 - 34.0 pg   MCHC 32.4 30.0 - 36.0 g/dL   RDW 29.5 28.4 - 13.2 %   Platelets 146 (L) 150 - 400 K/uL   nRBC 0.0 0.0 - 0.2 %   Neutrophils Relative % 59 %   Neutro Abs 3.6 1.7 - 7.7 K/uL   Lymphocytes Relative 21 %   Lymphs Abs 1.3 0.7 - 4.0 K/uL   Monocytes  Relative 12 %   Monocytes Absolute 0.7 0.1 - 1.0 K/uL   Eosinophils Relative 7 %   Eosinophils Absolute 0.5 0.0 - 0.5 K/uL   Basophils Relative 1 %   Basophils Absolute 0.0 0.0 - 0.1 K/uL   Immature Granulocytes 0 %   Abs Immature Granulocytes 0.02 0.00 - 0.07 K/uL  Magnesium  Result Value Ref Range   Magnesium 1.9 1.7 - 2.4 mg/dL       Pertinent labs & imaging results that were available during my care of the patient were reviewed by me and considered in my medical decision making.  Assessment & Plan:  Elwanda was seen today for hematuria.  Diagnoses and all orders for this visit:  Hematuria Urinalysis in office with 3+ blood and 3+ leukocytes. Culture pending.  -     Urinalysis, Routine w reflex microscopic  Acute cystitis with hematuria Will treat with below. Repeat urinalysis in 2 weeks, if infection has resolved and hematuria is still present, will consider imaging due to history of cancer.  -     sulfamethoxazole-trimethoprim (BACTRIM DS) 800-160 MG tablet; Take 1 tablet by mouth 2 (two) times daily for 7  days. -     Urine Culture     Continue all other maintenance medications.  Follow up plan: Return in about 2 weeks (around 04/13/2023), or if symptoms worsen or fail to improve, for urine recheck.   Continue healthy lifestyle choices, including diet (rich in fruits, vegetables, and lean proteins, and low in salt and simple carbohydrates) and exercise (at least 30 minutes of moderate physical activity daily).  Educational handout given for cystitis  The above assessment and management plan was discussed with the patient. The patient verbalized understanding of and has agreed to the management plan. Patient is aware to call the clinic if they develop any new symptoms or if symptoms persist or worsen. Patient is aware when to return to the clinic for a follow-up visit. Patient educated on when it is appropriate to go to the emergency department.   Kari Baars, FNP-C [facility] [phone]

## 2023-04-01 LAB — URINE CULTURE

## 2023-04-11 NOTE — Progress Notes (Signed)
Gabrielle [name] [address] [place] [address]    Clinic Day:  04/12/2023  Referring physician: [name], DO  Patient Care Team: [name], DO as PCP - General (Family Medicine) [name], MD as Consulting Physician (Retina Ophthalmology) Fabio Pierce, MD as Consulting Physician (Ophthalmology) [place] Bali, MD (Inactive) as Consulting Physician (Gastroenterology) Doreatha Massed, MD as Medical Oncologist (Oncology)   ASSESSMENT & PLAN:   Assessment: 1.  Stage IIIb (T4AN1A) poorly differentiated right colon adenocarcinoma: -Right hemicolectomy on 04/07/2020 with poorly differentiated adenocarcinoma, pT4a, positive radial margin, 1/15 lymph nodes positive, loss of MLH1 and PMS2, bladder biopsy negative for malignancy. -CTAP on 04/06/2020 showed circumferential ascending colon mass with numerous borderline enlarged pericolonic lymph nodes.  No findings of hepatic metastatic disease. -CEA on 04/04/2020 was 12.2.  CEA improved to 1.7 on 05/13/2020. -PET scan on 05/11/2020 shows mild FDG uptake associated with the anastomotic site.  Small pulmonary nodules that do not show elevated FDG activity, remain nonspecific, subcentimeter.  Persistent but improved bladder wall thickening. -Cycle 1 of dose reduced FOLFOX on 05/19/2020, followed by 2 hospitalizations, 1 from acute kidney injury from diarrhea and second admission for C. difficile colitis. -Cycle 2 of 5-FU and leucovorin dose reduced on 06/30/2020.  Chemotherapy discontinued secondary to intolerance. -Follow-up CT scan on 11/02/2020 with multiple soft tissue lesions in the central and right mesentery measuring up to 3.1 x 1.7 cm.  Mild lymphadenopathy in the hepatic duodenal ligament, retroperitoneal space, right common iliac chain, bilateral external iliac chains.  17 mm soft tissue nodule in the lower anterior abdominal wall.  Bilateral tiny pulmonary nodules not substantially changed.  2.9 x 2.5 cm  low-density lesion along the posterior uterus is indeterminate. -Pembrolizumab started on 11/10/2020. -CT CAP from 02/09/2021 showed interval near complete resolution of previously demonstrated nodal mass in the ileocolonic mesentery.  Residual ill-defined nodule medial to the tip of the right hepatic lobe.  Stable small pulmonary nodules bilaterally consistent with benign findings.   2.  Family history: -Paternal uncle had colon cancer.  Maternal aunt had brain cancer and another maternal aunt had gynecological malignancy.  Father had lung cancer and was a smoker. - Genetic testing did not reveal any notable mutations.   3.  Diffuse erythema and nodularity of the dome of the bladder: -CT scan showed severely thickened bladder wall. -Cystoscopy on 04/08/2020 showed diffuse erythema, nodularity in the posterior wall tracking to the dome.  Ureteral orifices were in normal locations.  Biopsies were benign.    Plan: 1.  Stage IV right colon adenocarcinoma, MSI-high: - CT CAP on 01/11/2023: Bilateral lung nodules favor benign.  No evidence of metastatic disease. - She does not report any diarrhea.  She has a dry cough for the last 1 week with nasal discharge.  Most likely allergies.  If it gets worse, she will let us know.  No skin rashes. - Labs today: Normal LFTs.  Creatinine 1.48 stable.  CBC was grossly normal.  TSH is 2.02.  Last CEA was 2.4. - She was reportedly treated for UTI and completed antibiotics last Friday. - She will proceed with treatment today and every 3 weeks.  RTC 9 weeks.  Will repeat CT CAP with contrast and CEA.   2.  Paroxysmal atrial fibrillation: - Continue Eliquis indefinitely.  No bleeding issues.   3.  Hypertension: - Continue Hyzaar and amlodipine.  Blood pressure today is 160/68.  4.  Hypokalemia: - Continue potassium 20 mEq  3 times daily.  Potassium is 4.9.    Orders Placed This Encounter  Procedures   CT CHEST ABDOMEN PELVIS W CONTRAST    Standing Status:    Future    Standing Expiration Date:   04/11/2024    Order Specific Question:   If indicated for the ordered procedure, I authorize the administration of contrast media per Radiology protocol    Answer:   Yes    Order Specific Question:   Does the patient have a contrast media/X-ray dye allergy?    Answer:   No    Order Specific Question:   Preferred imaging location?    Answer:   [facility]    Order Specific Question:   If indicated for the ordered procedure, I authorize the administration of oral contrast media per Radiology protocol    Answer:   Yes   Magnesium    Standing Status:   Future    Standing Expiration Date:   05/23/2024   CBC with Differential    Standing Status:   Future    Standing Expiration Date:   05/24/2024   Comprehensive metabolic panel    Standing Status:   Future    Standing Expiration Date:   05/24/2024   Magnesium    Standing Status:   Future    Standing Expiration Date:   06/13/2024   CBC with Differential    Standing Status:   Future    Standing Expiration Date:   06/14/2024   Comprehensive metabolic panel    Standing Status:   Future    Standing Expiration Date:   06/14/2024   Magnesium    Standing Status:   Future    Standing Expiration Date:   07/04/2024   CBC with Differential    Standing Status:   Future    Standing Expiration Date:   07/05/2024   Comprehensive metabolic panel    Standing Status:   Future    Standing Expiration Date:   07/05/2024   Magnesium    Standing Status:   Future    Standing Expiration Date:   07/25/2024   CBC with Differential    Standing Status:   Future    Standing Expiration Date:   07/26/2024   Comprehensive metabolic panel    Standing Status:   Future    Standing Expiration Date:   07/26/2024      I,Katie Daubenspeck,acting as a scribe for Doreatha Massed, MD.,have documented all relevant documentation on the behalf of Doreatha Massed, MD,as directed by  Doreatha Massed, MD while in the presence of  Doreatha Massed, MD.   I, Doreatha Massed MD, have reviewed the above documentation for accuracy and completeness, and I agree with the above.   Doreatha Massed, MD   5/8/20246:09 PM  CHIEF COMPLAINT:   Diagnosis: right colon cancer    Cancer Staging  Malignant neoplasm of colon [name]) Staging form: Colon and Rectum, AJCC 8th Edition - Clinical stage from 04/27/2020: Stage IIIB (cT4a, cN1a, cM0) - Unsigned - Pathologic stage from 11/05/2020: Stage IVC (rpTX, pN0, pM1c) - Signed by Doreatha Massed, MD on 11/05/2020    Prior Therapy: Rande Lawman every 3 weeks   Current Therapy:  COLORECTAL Pembrolizumab    HISTORY OF PRESENT ILLNESS:   Oncology History  Malignant neoplasm of colon (HCC)  04/07/2020 Initial Diagnosis   Malignant neoplasm of ascending colon (HCC)   05/13/2020 Genetic Testing   [facility]     05/19/2020 - 07/02/2020 Chemotherapy   The patient had  palonosetron (ALOXI) injection 0.25 mg, 0.25 mg, Intravenous,  Once, 2 of 12 cycles Administration: 0.25 mg (05/19/2020), 0.25 mg (06/30/2020) leucovorin 522 mg in dextrose 5 % 250 mL infusion, 320 mg/m2 = 522 mg (80 % of original dose 400 mg/m2), Intravenous,  Once, 2 of 12 cycles Dose modification: 320 mg/m2 (80 % of original dose 400 mg/m2, Cycle 1, Reason: Provider Judgment), [phone] mg/m2 (66.7 % of original dose 400 mg/m2, Cycle 2, Reason: Provider Judgment) Administration: 522 mg (05/19/2020), 434 mg (06/30/2020) oxaliplatin (ELOXATIN) 110 mg in dextrose 5 % 500 mL chemo infusion, 68 mg/m2 = 110 mg (80 % of original dose 85 mg/m2), Intravenous,  Once, 1 of 1 cycle Dose modification: 68 mg/m2 (80 % of original dose 85 mg/m2, Cycle 1, Reason: Provider Judgment) Administration: 110 mg (05/19/2020) fluorouracil (ADRUCIL) chemo injection 500 mg, 320 mg/m2 = 500 mg (80 % of original dose 400 mg/m2), Intravenous,  Once, 2 of 12 cycles Dose modification: 320 mg/m2 (80 % of original dose 400 mg/m2, Cycle 1,  Reason: Provider Judgment), [phone] mg/m2 (66.7 % of original dose 400 mg/m2, Cycle 2, Reason: Provider Judgment) Administration: 500 mg (05/19/2020), 450 mg (06/30/2020) fluorouracil (ADRUCIL) 3,150 mg in sodium chloride 0.9 % 87 mL chemo infusion, 1,920 mg/m2 = 3,150 mg (80 % of original dose 2,400 mg/m2), Intravenous, 1 Day/Dose, 2 of 12 cycles Dose modification: 1,920 mg/m2 (80 % of original dose 2,400 mg/m2, Cycle 1, Reason: Provider Judgment), 1,600 mg/m2 (66.7 % of original dose 2,400 mg/m2, Cycle 2, Reason: Provider Judgment) Administration: 3,150 mg (05/19/2020), 2,600 mg (06/30/2020)  for chemotherapy treatment.    08/03/2020 Genetic Testing   [facility] Reveal Testing     08/14/2020 Genetic Testing   No pathogenic variants detected in [facility] Common Hereditary Cancers Panel.  Variant of uncertain significance (VUS) detected in HOXB13 at c.634G>A (p.Ala212Thr). The Common Hereditary Cancers Panel offered by [facility] includes sequencing and/or deletion duplication testing of the following 48 genes: APC, ATM, AXIN2, BARD1, BMPR1A, BRCA1, BRCA2, BRIP1, CDH1, CDK4, CDKN2A (p14ARF), CDKN2A (p16INK4a), CHEK2, CTNNA1, DICER1, EPCAM (Deletion/duplication testing only), FLCN, GREM1 (promoter region deletion/duplication testing only), KIT, MEN1, MLH1, MSH2, MSH3, MSH6, MUTYH, NBN, NF1, NHTL1, PALB2, PDGFRA, PMS2, POLD1, POLE, PTEN, RAD50, RAD51C, RAD51D, RNF43, SDHB, SDHC, SDHD, SMAD4, SMARCA4. STK11, TP53, TSC1, TSC2, and VHL.  The following genes were evaluated for sequence changes only: SDHA and HOXB13 c.251G>A variant only. The report date is August 14, 2020.    11/05/2020 Cancer Staging   Staging form: Colon and Rectum, AJCC 8th Edition - Pathologic stage from 11/05/2020: Stage IVC (rpTX, pN0, pM1c) - Signed by Doreatha Massed, MD on 11/05/2020   11/10/2020 - 07/13/2022 Chemotherapy   Patient is on Treatment Plan : COLORECTAL Pembrolizumab q21d     11/10/2020 -  Chemotherapy   Patient is on  Treatment Plan : COLORECTAL Pembrolizumab (200) q21d        INTERVAL HISTORY:   Gabrielle [name] is a 77 y.o. female presenting to clinic today for follow up of right colon cancer. She was last seen by me on 02/08/23.  Today, she states that she is doing well overall. Her appetite level is at 100%. Her energy level is at 100%.  PAST MEDICAL HISTORY:   Past Medical History: Past Medical History:  Diagnosis Date   Arthritis    Cancer [name])    Cataract    OU   Colon cancer (HCC)    Family history of pancreatic cancer 08/21/2020   Family history of uterine cancer 08/21/2020  H/O cesarean section    Hx of tonsillectomy    Hyperlipidemia    Hypertension    Macular degeneration    Exu ARMD OU   Paroxysmal atrial fibrillation (HCC) 07/05/2020    Surgical History: Past Surgical History:  Procedure Laterality Date   BIOPSY  04/06/2020   Procedure: BIOPSY;  Surgeon: Malissa Hippo, MD;  [facility];  Service: Endoscopy;;   CARPAL TUNNEL RELEASE Right    CATARACT EXTRACTION W/PHACO Left 10/11/2019   Procedure: CATARACT EXTRACTION PHACO AND INTRAOCULAR LENS PLACEMENT (IOC);  Surgeon: Fabio Pierce, MD;  Location: [facility];  Service: Ophthalmology;  Laterality: Left;  CDE: 8.56   CATARACT EXTRACTION W/PHACO Right 10/25/2019   Procedure: CATARACT EXTRACTION PHACO AND INTRAOCULAR LENS PLACEMENT (IOC);  Surgeon: Fabio Pierce, MD;  Location: [facility];  Service: Ophthalmology;  Laterality: Right;  CDE: 5.67   CESAREAN SECTION     COLONOSCOPY N/A 04/06/2020   Procedure: COLONOSCOPY;  Surgeon: Malissa Hippo, MD;  Location: [facility];  Service: Endoscopy;  Laterality: N/A;   CYSTOSCOPY WITH BIOPSY N/A 04/08/2020   Procedure: CYSTOSCOPY WITH BIOPSY;  Surgeon: Malen Gauze, MD;  Location: [facility];  Service: Urology;  Laterality: N/A;   ESOPHAGOGASTRODUODENOSCOPY N/A 04/05/2020   Procedure: ESOPHAGOGASTRODUODENOSCOPY (EGD);  Surgeon: Malissa Hippo, MD;  Location: [facility];  Service:  Endoscopy;  Laterality: N/A;   PARTIAL COLECTOMY N/A 04/08/2020   Procedure: PARTIAL COLECTOMY;  Surgeon: Lucretia Roers, MD;  Location: [facility];  Service: General;  Laterality: N/A;   PORTACATH PLACEMENT Left 05/18/2020   Procedure: INSERTION PORT-A-CATH (ATTACHED CATHETER IN LEFT SUBCLAVIAN);  Surgeon: Lucretia Roers, MD;  Location: [facility];  Service: General;  Laterality: Left;   TONSILLECTOMY      Social History: Social History   Socioeconomic History   Marital status: Divorced    Spouse name: Not on file   Number of children: Not on file   Years of education: Not on file   Highest education level: Not on file  Occupational History   Not on file  Tobacco Use   Smoking status: Never   Smokeless tobacco: Never  Vaping Use   Vaping Use: Never used  Substance and Sexual Activity   Alcohol use: No   Drug use: No   Sexual activity: Not Currently  Other Topics Concern   Not on file  Social History Narrative   Not on file   Social Determinants of Health   Financial Resource Strain: Low Risk  (03/15/2023)   Overall Financial Resource Strain (CARDIA)    Difficulty of Paying Living Expenses: Not hard at all  Food Insecurity: No Food Insecurity (03/15/2023)   Hunger Vital Sign    Worried About Running Out of Food in the Last Year: Never true    Ran Out of Food in the Last Year: Never true  Transportation Needs: No Transportation Needs (03/15/2023)   PRAPARE - Administrator, Civil Service (Medical): No    Lack of Transportation (Non-Medical): No  Physical Activity: Sufficiently Active (03/15/2023)   Exercise Vital Sign    Days of Exercise per Week: 5 days    Minutes of Exercise per Session: 30 min  Stress: No Stress Concern Present (03/15/2023)   [employer] of Occupational Health - Occupational Stress Questionnaire    Feeling of Stress : Not at all  Social Connections: Moderately Integrated (03/15/2023)   Social Connection and Isolation Panel [NHANES]     Frequency of  Communication with Friends and Family: More than three times a week    Frequency of Social Gatherings with Friends and Family: More than three times a week    Attends Religious Services: More than 4 times per year    Active Member of [employer] or Organizations: Yes    Attends Banker Meetings: More than 4 times per year    Marital Status: Widowed  Intimate Partner Violence: Not At Risk (03/15/2023)   Humiliation, Afraid, Rape, and Kick questionnaire    Fear of Current or Ex-Partner: No    Emotionally Abused: No    Physically Abused: No    Sexually Abused: No    Family History: Family History  Problem Relation Age of Onset   Macular degeneration Mother    Stroke Mother    Hypertension Mother    Dementia Mother    Lung cancer Father        dx late 44s; smoking hx   Diabetes Sister    Hypertension Sister    Leukemia Paternal Uncle        d. 93s   Cancer Maternal Aunt        ovarian or endometrial dx 25s   Pancreatic cancer Paternal Uncle        d. late 32s    Current Medications:  Current Outpatient Medications:    alendronate (FOSAMAX) 70 MG tablet, TAKE ONE TABLET EVERY 7 DAYS ON AN EMPTY STOMACH WITH A FULL GLASS OF WATER, Disp: 12 tablet, Rfl: 3   amLODipine (NORVASC) 10 MG tablet, Take 1 tablet (10 mg total) by mouth daily. For blood pressure, Disp: 90 tablet, Rfl: 3   atorvastatin (LIPITOR) 20 MG tablet, Take 1 tablet (20 mg total) by mouth daily. For cholesterol, Disp: 90 tablet, Rfl: 3   cholecalciferol (VITAMIN D3) 25 MCG (1000 UT) tablet, Take 1,000 Units by mouth daily., Disp: , Rfl:    CRANBERRY FRUIT PO, Take 1 tablet by mouth daily., Disp: , Rfl:    diclofenac Sodium (VOLTAREN) 1 % GEL, APPLY 4 GRAMS TO AFFECTED AREA(S) 4 TIMES A DAY, Disp: 400 g, Rfl: 2   dorzolamide-timolol (COSOPT) 2-0.5 % ophthalmic solution, INSTILL ONE DROP IN EACH EYE TWICE DAILY, Disp: 10 mL, Rfl: 10   dorzolamide-timolol (COSOPT) 2-0.5 % ophthalmic solution, Place 1  drop into the right eye 2 (two) times daily., Disp: 3 mL, Rfl: 11   ELIQUIS 5 MG TABS tablet, TAKE ONE TABLET TWICE DAILY, Disp: 60 tablet, Rfl: 6   hydrALAZINE (APRESOLINE) 25 MG tablet, Take 1 tablet (25 mg total) by mouth 2 (two) times daily., Disp: 180 tablet, Rfl: 3   losartan-hydrochlorothiazide (HYZAAR) 100-25 MG tablet, Take 1 tablet by mouth daily. (NEEDS TO BE SEEN BEFORE NEXT REFILL), Disp: 30 tablet, Rfl: 0   potassium chloride SA (KLOR-CON M) 20 MEQ tablet, Take 1 tablet (20 mEq total) by mouth 3 (three) times daily., Disp: 90 tablet, Rfl: 3   spironolactone (ALDACTONE) 25 MG tablet, Take 1 tablet (25 mg total) by mouth daily., Disp: 90 tablet, Rfl: 3 No current facility-administered medications for this visit.  Facility-Administered Medications Ordered in Other Visits:    sodium chloride flush (NS) 0.9 % injection 10 mL, 10 mL, Intracatheter, PRN, Doreatha Massed, MD, 10 mL at 04/12/23 1511   Allergies: Allergies  Allergen Reactions   Lisinopril Cough    REVIEW OF SYSTEMS:   Review of Systems  Constitutional:  Negative for chills, fatigue and fever.  HENT:   Negative for lump/mass,  mouth sores, nosebleeds, sore throat and trouble swallowing.   Eyes:  Negative for eye problems.  Respiratory:  Positive for cough. Negative for shortness of breath.   Cardiovascular:  Negative for chest pain, leg swelling and palpitations.  Gastrointestinal:  Negative for abdominal pain, constipation, diarrhea, nausea and vomiting.  Genitourinary:  Negative for bladder incontinence, difficulty urinating, dysuria, frequency, hematuria and nocturia.   Musculoskeletal:  Negative for arthralgias, back pain, flank pain, myalgias and neck pain.  Skin:  Negative for itching and rash.  Neurological:  Negative for dizziness, headaches and numbness.  Hematological:  Does not bruise/bleed easily.  Psychiatric/Behavioral:  Negative for depression, sleep disturbance and suicidal ideas. The patient is  not nervous/anxious.   All other systems reviewed and are negative.    VITALS:   Blood pressure (!) 164/78, pulse 70, temperature 97.6 F (36.4 C), temperature source Oral, resp. rate 16, weight 179 lb (81.2 kg), SpO2 100 %.  Wt Readings from Last 3 Encounters:  04/12/23 179 lb (81.2 kg)  03/30/23 179 lb 9.6 oz (81.5 kg)  03/22/23 177 lb 3.2 oz (80.4 kg)    Body mass index is 31.71 kg/m.  Performance status (ECOG): 1 - Symptomatic but completely ambulatory  PHYSICAL EXAM:   Physical Exam Vitals and nursing note reviewed. Exam conducted with a chaperone present.  Constitutional:      Appearance: Normal appearance.  Cardiovascular:     Rate and Rhythm: Normal rate and regular rhythm.     Pulses: Normal pulses.     Heart sounds: Normal heart sounds.  Pulmonary:     Effort: Pulmonary effort is normal.     Breath sounds: Normal breath sounds.  Abdominal:     Palpations: Abdomen is soft. There is no hepatomegaly, splenomegaly or mass.     Tenderness: There is no abdominal tenderness.  Musculoskeletal:     Right lower leg: No edema.     Left lower leg: No edema.  Lymphadenopathy:     Cervical: No cervical adenopathy.     Right cervical: No superficial, deep or posterior cervical adenopathy.    Left cervical: No superficial, deep or posterior cervical adenopathy.     Upper Body:     Right upper body: No supraclavicular or axillary adenopathy.     Left upper body: No supraclavicular or axillary adenopathy.  Neurological:     General: No focal deficit present.     Mental Status: She is alert and oriented to person, place, and time.  Psychiatric:        Mood and Affect: Mood normal.        Behavior: Behavior normal.     LABS:      Latest Ref Rng & Units 04/12/2023   12:28 PM 03/22/2023    9:33 AM 03/01/2023   10:41 AM  CBC  WBC 4.0 - 10.5 K/uL 5.7  6.1  6.2   Hemoglobin 12.0 - 15.0 g/dL 16.1  09.6  04.5   Hematocrit 36.0 - 46.0 % 35.2  38.0  35.8   Platelets 150 - 400  K/uL 154  146  144       Latest Ref Rng & Units 04/12/2023   12:28 PM 03/22/2023    9:33 AM 03/01/2023   10:41 AM  CMP  Glucose 70 - 99 mg/dL 90  95  409   BUN 8 - 23 mg/dL 29  33  31   Creatinine 0.44 - 1.00 mg/dL 8.11  9.14  7.82   Sodium 135 -  145 mmol/L 129  135  132   Potassium 3.5 - 5.1 mmol/L 4.9  4.4  4.2   Chloride 98 - 111 mmol/L 98  100  101   CO2 22 - 32 mmol/L 23  25  24    Calcium 8.9 - 10.3 mg/dL 9.8  11.9  9.5   Total Protein 6.5 - 8.1 g/dL 7.4  7.5  7.4   Total Bilirubin 0.3 - 1.2 mg/dL 0.5  0.7  0.7   Alkaline Phos 38 - 126 U/L 69  63  64   AST 15 - 41 U/L 20  18  18    ALT 0 - 44 U/L 15  18  16       Lab Results  Component Value Date   CEA1 2.4 03/01/2023   /  CEA  Date Value Ref Range Status  03/01/2023 2.4 0.0 - 4.7 ng/mL Final    Comment:    (NOTE)                             Nonsmokers          <3.9                             Smokers             <5.6 Roche [employer] Electrochemiluminescence Immunoassay (ECLIA) Values obtained with different assay methods or kits cannot be used interchangeably.  Results cannot be interpreted as absolute evidence of the presence or absence of malignant disease. Performed At: [facility] [address] [place], [place] [id number] Jolene Schimke MD ZH:[id number]    No results found for: "PSA1" No results found for: "CAN199" No results found for: "CAN125"  No results found for: "TOTALPROTELP", "ALBUMINELP", "A1GS", "A2GS", "BETS", "BETA2SER", "GAMS", "MSPIKE", "SPEI" Lab Results  Component Value Date   TIBC 371 03/01/2023   TIBC 396 05/13/2020   TIBC 395 05/13/2020   FERRITIN 52 03/01/2023   FERRITIN 65 05/13/2020   FERRITIN 64 05/13/2020   IRONPCTSAT 18 03/01/2023   IRONPCTSAT 23 05/13/2020   IRONPCTSAT 23 05/13/2020   No results found for: "LDH"   STUDIES:   Intravitreal Injection, Pharmacologic Agent - OS - Left Eye  Result Date: 03/24/2023 Time Out 03/24/2023. 8:13 AM. Confirmed correct  patient, procedure, site, and patient consented. Anesthesia Topical anesthesia was used. Anesthetic medications included Lidocaine 2%, Proparacaine 0.5%. Procedure Preparation included 5% betadine to ocular surface, eyelid speculum. A (32g) needle was used. Injection: 6 mg faricimab-svoa 6 MG/0.05ML   Route: Intravitreal, Site: Left Eye   NDC: [id number], Lot: [id number], Expiration date: 03/04/2025, Waste: 0 mL Post-op Post injection exam found visual acuity of at least counting fingers. The patient tolerated the procedure well. There were no complications. The patient received written and verbal post procedure care education. Post injection medications were not given.   OCT, Retina - OU - Both Eyes  Result Date: 03/24/2023 Right Eye Quality was good. Central Foveal Thickness: 499. Progression has been stable. Findings include no SRF, abnormal foveal contour, retinal drusen , outer retinal tubulation, subretinal hyper-reflective material, disciform scar, epiretinal membrane, intraretinal fluid, pigment epithelial detachment, outer retinal atrophy (Stable improvement in sub-retinal scar and cystic changes overlying ). Left Eye Quality was good. Central Foveal Thickness: 279. Progression has improved. Findings include no SRF, abnormal foveal contour, retinal drusen , subretinal hyper-reflective material, intraretinal fluid, pigment epithelial detachment, outer retinal atrophy (Mild  interval improvement in cystic changes overlying PED/SRHM ). Notes *Images captured and stored on drive Diagnosis / Impression: Exudative ARMD OU OD: Stable improvement in sub-retinal scar and  cystic changes overlying OS: mild interval improvement in cystic changes overlying PED/SRHM Clinical management: See below Abbreviations: NFP - Normal foveal profile. CME - cystoid macular edema. PED - pigment epithelial detachment. IRF - intraretinal fluid. SRF - subretinal fluid. EZ - ellipsoid zone. ERM - epiretinal membrane. ORA - outer  retinal atrophy. ORT - outer retinal tubulation. SRHM - subretinal hyper-reflective material

## 2023-04-12 ENCOUNTER — Inpatient Hospital Stay (HOSPITAL_BASED_OUTPATIENT_CLINIC_OR_DEPARTMENT_OTHER): Payer: Medicare HMO | Admitting: Hematology

## 2023-04-12 ENCOUNTER — Encounter: Payer: Self-pay | Admitting: Hematology

## 2023-04-12 ENCOUNTER — Inpatient Hospital Stay: Payer: Medicare HMO | Attending: Hematology

## 2023-04-12 ENCOUNTER — Inpatient Hospital Stay: Payer: Medicare HMO

## 2023-04-12 VITALS — BP 168/74 | HR 70 | Temp 98.2°F | Resp 17

## 2023-04-12 VITALS — BP 164/78 | HR 70 | Temp 97.6°F | Resp 16 | Wt 179.0 lb

## 2023-04-12 DIAGNOSIS — C189 Malignant neoplasm of colon, unspecified: Secondary | ICD-10-CM

## 2023-04-12 DIAGNOSIS — Z5112 Encounter for antineoplastic immunotherapy: Secondary | ICD-10-CM | POA: Insufficient documentation

## 2023-04-12 DIAGNOSIS — Z79620 Long term (current) use of immunosuppressive biologic: Secondary | ICD-10-CM | POA: Insufficient documentation

## 2023-04-12 DIAGNOSIS — C182 Malignant neoplasm of ascending colon: Secondary | ICD-10-CM | POA: Insufficient documentation

## 2023-04-12 DIAGNOSIS — Z79899 Other long term (current) drug therapy: Secondary | ICD-10-CM

## 2023-04-12 LAB — CBC WITH DIFFERENTIAL/PLATELET
Abs Immature Granulocytes: 0.03 10*3/uL (ref 0.00–0.07)
Basophils Absolute: 0 10*3/uL (ref 0.0–0.1)
Basophils Relative: 1 %
Eosinophils Absolute: 0.3 10*3/uL (ref 0.0–0.5)
Eosinophils Relative: 5 %
HCT: 35.2 % — ABNORMAL LOW (ref 36.0–46.0)
Hemoglobin: 11.6 g/dL — ABNORMAL LOW (ref 12.0–15.0)
Immature Granulocytes: 1 %
Lymphocytes Relative: 22 %
Lymphs Abs: 1.2 10*3/uL (ref 0.7–4.0)
MCH: 30.9 pg (ref 26.0–34.0)
MCHC: 33 g/dL (ref 30.0–36.0)
MCV: 93.6 fL (ref 80.0–100.0)
Monocytes Absolute: 0.5 10*3/uL (ref 0.1–1.0)
Monocytes Relative: 9 %
Neutro Abs: 3.6 10*3/uL (ref 1.7–7.7)
Neutrophils Relative %: 62 %
Platelets: 154 10*3/uL (ref 150–400)
RBC: 3.76 MIL/uL — ABNORMAL LOW (ref 3.87–5.11)
RDW: 12.5 % (ref 11.5–15.5)
WBC: 5.7 10*3/uL (ref 4.0–10.5)
nRBC: 0 % (ref 0.0–0.2)

## 2023-04-12 LAB — COMPREHENSIVE METABOLIC PANEL
ALT: 15 U/L (ref 0–44)
AST: 20 U/L (ref 15–41)
Albumin: 4.1 g/dL (ref 3.5–5.0)
Alkaline Phosphatase: 69 U/L (ref 38–126)
Anion gap: 8 (ref 5–15)
BUN: 29 mg/dL — ABNORMAL HIGH (ref 8–23)
CO2: 23 mmol/L (ref 22–32)
Calcium: 9.8 mg/dL (ref 8.9–10.3)
Chloride: 98 mmol/L (ref 98–111)
Creatinine, Ser: 1.48 mg/dL — ABNORMAL HIGH (ref 0.44–1.00)
GFR, Estimated: 36 mL/min — ABNORMAL LOW (ref >60)
Glucose, Bld: 90 mg/dL (ref 70–99)
Potassium: 4.9 mmol/L (ref 3.5–5.1)
Sodium: 129 mmol/L — ABNORMAL LOW (ref 135–145)
Total Bilirubin: 0.5 mg/dL (ref 0.3–1.2)
Total Protein: 7.4 g/dL (ref 6.5–8.1)

## 2023-04-12 LAB — MAGNESIUM: Magnesium: 1.8 mg/dL (ref 1.7–2.4)

## 2023-04-12 LAB — TSH: TSH: 2.027 u[IU]/mL (ref 0.350–4.500)

## 2023-04-12 MED ORDER — SODIUM CHLORIDE 0.9 % IV SOLN: Freq: Once | INTRAVENOUS | Status: AC

## 2023-04-12 MED ORDER — SODIUM CHLORIDE 0.9 % IV SOLN
200.0000 mg | Freq: Once | INTRAVENOUS | Status: AC
  Administered 2023-04-12: 200 mg via INTRAVENOUS
  Filled 2023-04-12: qty 8

## 2023-04-12 MED ORDER — SODIUM CHLORIDE 0.9% FLUSH
10.0000 mL | INTRAVENOUS | Status: DC | PRN
  Administered 2023-04-12: 10 mL

## 2023-04-12 MED ORDER — HEPARIN SOD (PORK) LOCK FLUSH 100 UNIT/ML IV SOLN
500.0000 [IU] | Freq: Once | INTRAVENOUS | Status: AC | PRN
  Administered 2023-04-12: 500 [IU]

## 2023-04-12 NOTE — Patient Instructions (Signed)
[facility] at [facility] Discharge Instructions   You were seen and examined today by Dr. Katragadda.  He reviewed the results of your lab work which are normal/stable.   We will proceed with your treatment today.  Return as scheduled.    Thank you for choosing [facility] at [facility] to provide your oncology and hematology care.  To afford each patient quality time with our provider, please arrive at least 15 minutes before your scheduled appointment time.   If you have a lab appointment with the Cancer Center please come in thru the Main Entrance and check in at the main information desk.  You need to re-schedule your appointment should you arrive 10 or more minutes late.  We strive to give you quality time with our providers, and arriving late affects you and other patients whose appointments are after yours.  Also, if you no show three or more times for appointments you may be dismissed from the clinic at the providers discretion.     Again, thank you for choosing [facility].  Our hope is that these requests will decrease the amount of time that you wait before being seen by our physicians.       _____________________________________________________________  Should you have questions after your visit to [facility], please contact our office at [phone] and follow the prompts.  Our office hours are 8:00 a.m. and 4:30 p.m. Monday - Friday.  Please note that voicemails left after 4:00 p.m. may not be returned until the following business day.  We are closed weekends and major holidays.  You do have access to a nurse 24-7, just call the main number to the clinic [phone] and do not press any options, hold on the line and a nurse will answer the phone.    For prescription refill requests, have your pharmacy contact our office and allow 72 hours.    Due to Covid, you will need to wear a mask upon entering  the hospital. If you do not have a mask, a mask will be given to you at the Main Entrance upon arrival. For doctor visits, patients may have 1 support person age 18 or older with them. For treatment visits, patients can not have anyone with them due to social distancing guidelines and our immunocompromised population.      

## 2023-04-12 NOTE — Progress Notes (Signed)
Patient presents today for Keytruda infusion per providers order.  Vital signs and labs reviewed by MD.  Message received from Chapman Moss RN/Dr. [name] patient okay for treatment.  Stable during infusion without adverse affects.  Vital signs stable.  No complaints at this time.  Discharge from clinic ambulatory in stable condition.  Alert and oriented X 3.  Follow up with [facility] as scheduled.

## 2023-04-12 NOTE — Patient Instructions (Signed)
[facility] [place]  Discharge Instructions: Thank you for choosing [facility] to provide your oncology and hematology care.  If you have a lab appointment with the Cancer Center - please note that after April 8th, 2024, all labs will be drawn in the cancer center.  You do not have to check in or register with the main entrance as you have in the past but will complete your check-in in the cancer center.  Wear comfortable clothing and clothing appropriate for easy access to any Portacath or PICC line.   We strive to give you quality time with your provider. You may need to reschedule your appointment if you arrive late (15 or more minutes).  Arriving late affects you and other patients whose appointments are after yours.  Also, if you miss three or more appointments without notifying the office, you may be dismissed from the clinic at the provider's discretion.      For prescription refill requests, have your pharmacy contact our office and allow 72 hours for refills to be completed.    Today you received the following chemotherapy and/or immunotherapy agents Keytruda      To help prevent nausea and vomiting after your treatment, we encourage you to take your nausea medication as directed.  BELOW ARE SYMPTOMS THAT SHOULD BE REPORTED IMMEDIATELY: *FEVER GREATER THAN 100.4 F (38 C) OR HIGHER *CHILLS OR SWEATING *NAUSEA AND VOMITING THAT IS NOT CONTROLLED WITH YOUR NAUSEA MEDICATION *UNUSUAL SHORTNESS OF BREATH *UNUSUAL BRUISING OR BLEEDING *URINARY PROBLEMS (pain or burning when urinating, or frequent urination) *BOWEL PROBLEMS (unusual diarrhea, constipation, pain near the anus) TENDERNESS IN MOUTH AND THROAT WITH OR WITHOUT PRESENCE OF ULCERS (sore throat, sores in mouth, or a toothache) UNUSUAL RASH, SWELLING OR PAIN  UNUSUAL VAGINAL DISCHARGE OR ITCHING   Items with * indicate a potential emergency and should be followed up as soon as possible or go to the  Emergency Department if any problems should occur.  Please show the CHEMOTHERAPY ALERT CARD or IMMUNOTHERAPY ALERT CARD at check-in to the Emergency Department and triage nurse.  Should you have questions after your visit or need to cancel or reschedule your appointment, please contact [facility] [place] [phone]  and follow the prompts.  Office hours are 8:00 a.m. to 4:30 p.m. Monday - Friday. Please note that voicemails left after 4:00 p.m. may not be returned until the following business day.  We are closed weekends and major holidays. You have access to a nurse at all times for urgent questions. Please call the main number to the clinic [phone] and follow the prompts.  For any non-urgent questions, you may also contact your provider using MyChart. We now offer e-Visits for anyone 18 and older to request care online for non-urgent symptoms. For details visit [email or web address].   Also download the MyChart app! Go to the app store, search "MyChart", open the app, select , and log in with your MyChart username and password.   

## 2023-04-18 ENCOUNTER — Other Ambulatory Visit: Payer: Self-pay | Admitting: Family Medicine

## 2023-04-18 ENCOUNTER — Ambulatory Visit (INDEPENDENT_AMBULATORY_CARE_PROVIDER_SITE_OTHER): Payer: Medicare HMO | Admitting: Nurse Practitioner

## 2023-04-18 ENCOUNTER — Encounter: Payer: Self-pay | Admitting: Family Medicine

## 2023-04-18 ENCOUNTER — Encounter: Payer: Self-pay | Admitting: Nurse Practitioner

## 2023-04-18 VITALS — BP 165/78 | HR 72 | Temp 97.4°F | Resp 20 | Ht 63.0 in | Wt 180.0 lb

## 2023-04-18 DIAGNOSIS — J069 Acute upper respiratory infection, unspecified: Secondary | ICD-10-CM | POA: Diagnosis not present

## 2023-04-18 MED ORDER — BENZONATATE 100 MG PO CAPS
100.0000 mg | ORAL_CAPSULE | Freq: Two times a day (BID) | ORAL | 0 refills | Status: DC | PRN
Start: 2023-04-18 — End: 2023-04-24

## 2023-04-18 MED ORDER — FLUTICASONE PROPIONATE 50 MCG/ACT NA SUSP
2.0000 | Freq: Every day | NASAL | 6 refills | Status: DC
Start: 2023-04-18 — End: 2023-11-30

## 2023-04-18 NOTE — Progress Notes (Signed)
Subjective:    Patient ID: NONNA KUTNEY, female    DOB: Aug 07, 1946, 77 y.o.   MRN: [id number]  Sinus Problem This is a new problem. The current episode started in the past 7 days. The problem has been gradually worsening since onset. There has been no fever. Her pain is at a severity of 4/10. Associated symptoms include congestion, coughing and sinus pressure. Pertinent negatives include no chills, headaches or sneezing. Past treatments include oral decongestants. The treatment provided mild relief.    BP Readings from Last 3 Encounters:  04/18/23 (!) 165/78  04/12/23 (!) 168/74  04/12/23 (!) 164/78       Review of Systems  Constitutional:  Negative for chills and fever.  HENT:  Positive for congestion and sinus pressure. Negative for sneezing.   Respiratory:  Positive for cough.   Neurological:  Negative for headaches.       Objective:   Physical Exam Vitals reviewed.  Constitutional:      Appearance: Normal appearance.  HENT:     Right Ear: Tympanic membrane normal.     Left Ear: Tympanic membrane normal.     Nose: Congestion and rhinorrhea present.     Mouth/Throat:     Pharynx: Oropharyngeal exudate present. No posterior oropharyngeal erythema.  Cardiovascular:     Rate and Rhythm: Normal rate and regular rhythm.     Heart sounds: Normal heart sounds.  Pulmonary:     Breath sounds: Normal breath sounds.  Skin:    General: Skin is warm.  Neurological:     General: No focal deficit present.     Mental Status: She is alert and oriented to person, place, and time.  Psychiatric:        Mood and Affect: Mood normal.        Behavior: Behavior normal.     BP (!) 165/78   Pulse 72   Temp (!) 97.4 F (36.3 C) (Temporal)   Resp 20   Ht 5\' 3"  (1.6 m)   Wt 180 lb (81.6 kg)   SpO2 97%   BMI 31.89 kg/m        Assessment & Plan:   Al Corpus in today with chief complaint of Sinus Problem   1. URI with cough and congestion 1. Take meds as  prescribed 2. Use a cool mist humidifier especially during the winter months and when heat has been humid. 3. Use saline nose sprays frequently 4. Saline irrigations of the nose can be very helpful if done frequently.  * 4X daily for 1 week*  * Use of a nettie pot can be helpful with this. Follow directions with this* 5. Drink plenty of fluids 6. Keep thermostat turn down low 7.For any cough or congestion- tesssalon perles 8. For fever or aces or pains- take tylenol or ibuprofen appropriate for age and weight.  * for fevers greater than 101 orally you may alternate ibuprofen and tylenol every  3 hours.    - fluticasone (FLONASE) 50 MCG/ACT nasal spray; Place 2 sprays into both nostrils daily.  Dispense: 16 g; Refill: 6 - benzonatate (TESSALON) 100 MG capsule; Take 1 capsule (100 mg total) by mouth 2 (two) times daily as needed for cough.  Dispense: 20 capsule; Refill: 0    The above assessment and management plan was discussed with the patient. The patient verbalized understanding of and has agreed to the management plan. Patient is aware to call the clinic if symptoms persist or worsen. Patient is aware  when to return to the clinic for a follow-up visit. Patient educated on when it is appropriate to go to the emergency department.   Mary-Margaret Hassell Done, FNP

## 2023-04-18 NOTE — Telephone Encounter (Signed)
Attempted to call and schedule. Letter sent.    Patient has appt for 5/14, added notes to follow up with PCP for medications.

## 2023-04-18 NOTE — Telephone Encounter (Signed)
Refill denied, 30 day supply was given 03/27/23. Please schedule with PCP for chronic follow up.

## 2023-04-18 NOTE — Patient Instructions (Signed)

## 2023-04-24 ENCOUNTER — Encounter: Payer: Self-pay | Admitting: Family Medicine

## 2023-04-24 ENCOUNTER — Ambulatory Visit (INDEPENDENT_AMBULATORY_CARE_PROVIDER_SITE_OTHER): Payer: Medicare HMO | Admitting: Family Medicine

## 2023-04-24 VITALS — BP 128/59 | HR 87 | Temp 97.9°F | Ht 63.0 in | Wt 179.2 lb

## 2023-04-24 DIAGNOSIS — M8580 Other specified disorders of bone density and structure, unspecified site: Secondary | ICD-10-CM

## 2023-04-24 DIAGNOSIS — I480 Paroxysmal atrial fibrillation: Secondary | ICD-10-CM

## 2023-04-24 DIAGNOSIS — Z23 Encounter for immunization: Secondary | ICD-10-CM

## 2023-04-24 DIAGNOSIS — E782 Mixed hyperlipidemia: Secondary | ICD-10-CM

## 2023-04-24 DIAGNOSIS — J069 Acute upper respiratory infection, unspecified: Secondary | ICD-10-CM | POA: Diagnosis not present

## 2023-04-24 DIAGNOSIS — N183 Chronic kidney disease, stage 3 unspecified: Secondary | ICD-10-CM | POA: Diagnosis not present

## 2023-04-24 DIAGNOSIS — I129 Hypertensive chronic kidney disease with stage 1 through stage 4 chronic kidney disease, or unspecified chronic kidney disease: Secondary | ICD-10-CM

## 2023-04-24 DIAGNOSIS — I10 Essential (primary) hypertension: Secondary | ICD-10-CM

## 2023-04-24 MED ORDER — SPIRONOLACTONE 25 MG PO TABS: 25.0000 mg | ORAL_TABLET | Freq: Every day | ORAL | 3 refills | Status: DC

## 2023-04-24 MED ORDER — ATORVASTATIN CALCIUM 20 MG PO TABS: 20.0000 mg | ORAL_TABLET | Freq: Every day | ORAL | 3 refills | Status: DC

## 2023-04-24 MED ORDER — BENZONATATE 100 MG PO CAPS
100.0000 mg | ORAL_CAPSULE | Freq: Two times a day (BID) | ORAL | 0 refills | Status: DC | PRN
Start: 2023-04-24 — End: 2023-05-17

## 2023-04-24 MED ORDER — HYDRALAZINE HCL 25 MG PO TABS: 25.0000 mg | ORAL_TABLET | Freq: Two times a day (BID) | ORAL | 3 refills | Status: DC

## 2023-04-24 MED ORDER — LOSARTAN POTASSIUM-HCTZ 100-25 MG PO TABS
1.0000 | ORAL_TABLET | Freq: Every day | ORAL | 3 refills | Status: DC
Start: 2023-04-24 — End: 2023-07-31

## 2023-04-24 MED ORDER — ELIQUIS 5 MG PO TABS: 5.0000 mg | ORAL_TABLET | Freq: Two times a day (BID) | ORAL | 3 refills | Status: DC

## 2023-04-24 MED ORDER — AMLODIPINE BESYLATE 10 MG PO TABS: 10.0000 mg | ORAL_TABLET | Freq: Every day | ORAL | 3 refills | Status: DC

## 2023-04-24 NOTE — Progress Notes (Signed)
Subjective: CC: Chronic follow-up PCP: [name], DO MV[facility]:QIONGE K Mueth is a 77 y.o. female presenting to clinic today for:  1.  Hypertension associated with hyperlipidemia, CKD 3/ afib/ colon cancer Patient is compliant with her amlodipine, Hyzaar, Aldactone and hydralazine.  She was prescribed potassium by her oncologist.  She does not see nephrology.  She denies any chest pain, shortness of breath, visual disturbance or edema.  No use of NSAIDs given chronic anticoagulation.  She is being treated for malignant neoplasm of the colon She denies any rectal bleeding, epistaxis or hematuria   ROS: Per HPI  Allergies  Allergen Reactions   Lisinopril Cough   Past Medical History:  Diagnosis Date   Arthritis    Cancer (HCC)    Cataract    OU   Colon cancer (HCC)    Family history of pancreatic cancer 08/21/2020   Family history of uterine cancer 08/21/2020   H/O cesarean section    Hx of tonsillectomy    Hyperlipidemia    Hypertension    Macular degeneration    Exu ARMD OU   Paroxysmal atrial fibrillation (HCC) 07/05/2020    Current Outpatient Medications:    alendronate (FOSAMAX) 70 MG tablet, TAKE ONE TABLET EVERY 7 DAYS ON AN EMPTY STOMACH WITH A FULL GLASS OF WATER, Disp: 12 tablet, Rfl: 3   amLODipine (NORVASC) 10 MG tablet, Take 1 tablet (10 mg total) by mouth daily. For blood pressure, Disp: 90 tablet, Rfl: 3   atorvastatin (LIPITOR) 20 MG tablet, Take 1 tablet (20 mg total) by mouth daily. For cholesterol, Disp: 90 tablet, Rfl: 3   benzonatate (TESSALON) 100 MG capsule, Take 1 capsule (100 mg total) by mouth 2 (two) times daily as needed for cough., Disp: 20 capsule, Rfl: 0   cholecalciferol (VITAMIN D3) 25 MCG (1000 UT) tablet, Take 1,000 Units by mouth daily., Disp: , Rfl:    CRANBERRY FRUIT PO, Take 1 tablet by mouth daily., Disp: , Rfl:    diclofenac Sodium (VOLTAREN) 1 % GEL, APPLY 4 GRAMS TO AFFECTED AREA(S) 4 TIMES A DAY, Disp: 400 g, Rfl: 2    dorzolamide-timolol (COSOPT) 2-0.5 % ophthalmic solution, INSTILL ONE DROP IN EACH EYE TWICE DAILY, Disp: 10 mL, Rfl: 10   dorzolamide-timolol (COSOPT) 2-0.5 % ophthalmic solution, Place 1 drop into the right eye 2 (two) times daily., Disp: 3 mL, Rfl: 11   ELIQUIS 5 MG TABS tablet, TAKE ONE TABLET TWICE DAILY, Disp: 60 tablet, Rfl: 6   fluticasone (FLONASE) 50 MCG/ACT nasal spray, Place 2 sprays into both nostrils daily., Disp: 16 g, Rfl: 6   hydrALAZINE (APRESOLINE) 25 MG tablet, Take 1 tablet (25 mg total) by mouth 2 (two) times daily., Disp: 180 tablet, Rfl: 3   losartan-hydrochlorothiazide (HYZAAR) 100-25 MG tablet, Take 1 tablet by mouth daily. (NEEDS TO BE SEEN BEFORE NEXT REFILL), Disp: 30 tablet, Rfl: 0   potassium chloride SA (KLOR-CON M) 20 MEQ tablet, Take 1 tablet (20 mEq total) by mouth 3 (three) times daily., Disp: 90 tablet, Rfl: 3   spironolactone (ALDACTONE) 25 MG tablet, Take 1 tablet (25 mg total) by mouth daily., Disp: 90 tablet, Rfl: 3 Social History   Socioeconomic History   Marital status: Divorced    Spouse name: Not on file   Number of children: Not on file   Years of education: Not on file   Highest education level: Not on file  Occupational History   Not on file  Tobacco Use  Smoking status: Never   Smokeless tobacco: Never  Vaping Use   Vaping Use: Never used  Substance and Sexual Activity   Alcohol use: No   Drug use: No   Sexual activity: Not Currently  Other Topics Concern   Not on file  Social History Narrative   Not on file   Social Determinants of Health   Financial Resource Strain: Low Risk  (03/15/2023)   Overall Financial Resource Strain (CARDIA)    Difficulty of Paying Living Expenses: Not hard at all  Food Insecurity: No Food Insecurity (03/15/2023)   Hunger Vital Sign    Worried About Running Out of Food in the Last Year: Never true    Ran Out of Food in the Last Year: Never true  Transportation Needs: No Transportation Needs (03/15/2023)    PRAPARE - Administrator, Civil Service (Medical): No    Lack of Transportation (Non-Medical): No  Physical Activity: Sufficiently Active (03/15/2023)   Exercise Vital Sign    Days of Exercise per Week: 5 days    Minutes of Exercise per Session: 30 min  Stress: No Stress Concern Present (03/15/2023)   [employer] of Occupational Health - Occupational Stress Questionnaire    Feeling of Stress : Not at all  Social Connections: Moderately Integrated (03/15/2023)   Social Connection and Isolation Panel [NHANES]    Frequency of Communication with Friends and Family: More than three times a week    Frequency of Social Gatherings with Friends and Family: More than three times a week    Attends Religious Services: More than 4 times per year    Active Member of [employer] or Organizations: Yes    Attends Banker Meetings: More than 4 times per year    Marital Status: Widowed  Intimate Partner Violence: Not At Risk (03/15/2023)   Humiliation, Afraid, Rape, and Kick questionnaire    Fear of Current or Ex-Partner: No    Emotionally Abused: No    Physically Abused: No    Sexually Abused: No   Family History  Problem Relation Age of Onset   Macular degeneration Mother    Stroke Mother    Hypertension Mother    Dementia Mother    Lung cancer Father        dx late 14s; smoking hx   Diabetes Sister    Hypertension Sister    Leukemia Paternal Uncle        d. 29s   Cancer Maternal Aunt        ovarian or endometrial dx 53s   Pancreatic cancer Paternal Uncle        d. late 6s    Objective: Office vital signs reviewed. BP (!) 128/59   Pulse 87   Temp 97.9 F (36.6 C)   Ht 5\' 3"  (1.6 m)   Wt 179 lb 3.2 oz (81.3 kg)   SpO2 96%   BMI 31.74 kg/m   Physical Examination:  General: Awake, alert, well nourished, No acute distress HEENT: sclera white, MMM.  TMs intact bilaterally with normal light reflex. Cardio: regular rate and rhythm, S1S2 heard, no murmurs  appreciated Pulm: clear to auscultation bilaterally, no wheezes, rhonchi or rales; normal work of breathing on room air Extremities: No edema  Assessment/ Plan: 77 y.o. female   Stage 3 chronic kidney disease due to benign hypertension (HCC) - Plan: VITAMIN D 25 Hydroxy (Vit-D Deficiency, Fractures), Ambulatory referral to Nephrology, losartan-hydrochlorothiazide (HYZAAR) 100-25 MG tablet  Essential hypertension - Plan: amLODipine (  NORVASC) 10 MG tablet, hydrALAZINE (APRESOLINE) 25 MG tablet, losartan-hydrochlorothiazide (HYZAAR) 100-25 MG tablet, spironolactone (ALDACTONE) 25 MG tablet  Paroxysmal atrial fibrillation (HCC) - Plan: ELIQUIS 5 MG TABS tablet  Osteopenia with high risk of fracture - Plan: VITAMIN D 25 Hydroxy (Vit-D Deficiency, Fractures)  Mixed hyperlipidemia - Plan: Lipid Panel, atorvastatin (LIPITOR) 20 MG tablet  URI with cough and congestion - Plan: benzonatate (TESSALON) 100 MG capsule  Check vitamin D.  Renal function is regularly checked with oncology so I did not repeat this today  Blood pressure was controlled upon recheck.  Her potassium level has been normal the last several checks with oncology.  She is prescribed potassium supplement.  A-fib seems to be both rate and rhythm controlled today.  Eliquis renewed  Continue Fosamax as prescribed.  Check vitamin D level  Check nonfasting lipid.  Atorvastatin renewed  Tessalon Perles renewed for as needed use given subacute URI   Orders Placed This Encounter  Procedures   Tdap vaccine greater than or equal to 7yo IM   Lipid Panel   VITAMIN D 25 Hydroxy (Vit-D Deficiency, Fractures)   Ambulatory referral to Nephrology    Referral Priority:   Routine    Referral Type:   Consultation    Referral Reason:   Specialty Services Required    Requested Specialty:   Nephrology    Number of Visits Requested:   1   No orders of the defined types were placed in this encounter.    [name], DO [facility] [phone]

## 2023-04-25 ENCOUNTER — Other Ambulatory Visit: Payer: Self-pay

## 2023-04-25 LAB — LIPID PANEL
Chol/HDL Ratio: 3.4 ratio (ref 0.0–4.4)
Cholesterol, Total: 168 mg/dL (ref 100–199)
HDL: 50 mg/dL (ref >39)
LDL Chol Calc (NIH): 76 mg/dL (ref 0–99)
Triglycerides: 256 mg/dL — ABNORMAL HIGH (ref 0–149)
VLDL Cholesterol Cal: 42 mg/dL — ABNORMAL HIGH (ref 5–40)

## 2023-04-25 LAB — VITAMIN D 25 HYDROXY (VIT D DEFICIENCY, FRACTURES): Vit D, 25-Hydroxy: 51.4 ng/mL (ref 30.0–100.0)

## 2023-04-27 NOTE — Progress Notes (Signed)
[facility] - Clinic Note  04/28/2023    CHIEF COMPLAINT Patient presents for Retina Follow Up  HISTORY OF PRESENT ILLNESS: Gabrielle [name] is a 77 y.o. female who presents to the clinic today for:   HPI     Retina Follow Up   Patient presents with  Wet AMD.  In left eye.  This started 5 weeks ago.  I, the attending physician,  performed the HPI with the patient and updated documentation appropriately.        Comments   Patient here for 5 weeks retina follow up for exu ARMD OS. Patient states vision about the same. No eye pain. Has allergies and cough. Been to the doctor.       Last edited by [name], MD on 04/28/2023  2:33 PM.     Patient states she's been having a hard time with allergies, she has been to the dr twice about them, vision seems the same  Referring physician: [name], DO [address],  [place] [address]  HISTORICAL INFORMATION:   Selected notes from the MEDICAL RECORD NUMBER Referred by Dr. Mercie Eon for concern of ARMD OU   CURRENT MEDICATIONS: Current Outpatient Medications (Ophthalmic Drugs)  Medication Sig   dorzolamide-timolol (COSOPT) 2-0.5 % ophthalmic solution INSTILL ONE DROP IN [facility] EYE TWICE DAILY   dorzolamide-timolol (COSOPT) 2-0.5 % ophthalmic solution Place 1 drop into the right eye 2 (two) times daily.   No current facility-administered medications for this visit. (Ophthalmic Drugs)   Current Outpatient Medications (Other)  Medication Sig   alendronate (FOSAMAX) 70 MG tablet TAKE ONE TABLET EVERY 7 DAYS ON AN EMPTY STOMACH WITH A FULL GLASS OF WATER   amLODipine (NORVASC) 10 MG tablet Take 1 tablet (10 mg total) by mouth daily. For blood pressure   atorvastatin (LIPITOR) 20 MG tablet Take 1 tablet (20 mg total) by mouth daily. For cholesterol   benzonatate (TESSALON) 100 MG capsule Take 1 capsule (100 mg total) by mouth 2 (two) times daily as needed for cough.   cholecalciferol (VITAMIN D3) 25  MCG (1000 UT) tablet Take 1,000 Units by mouth daily.   CRANBERRY FRUIT PO Take 1 tablet by mouth daily.   diclofenac Sodium (VOLTAREN) 1 % GEL APPLY 4 GRAMS TO AFFECTED AREA(S) 4 TIMES A DAY   ELIQUIS 5 MG TABS tablet Take 1 tablet (5 mg total) by mouth 2 (two) times daily.   fluticasone (FLONASE) 50 MCG/ACT nasal spray Place 2 sprays into both nostrils daily.   hydrALAZINE (APRESOLINE) 25 MG tablet Take 1 tablet (25 mg total) by mouth 2 (two) times daily.   losartan-hydrochlorothiazide (HYZAAR) 100-25 MG tablet Take 1 tablet by mouth daily.   potassium chloride SA (KLOR-CON M) 20 MEQ tablet Take 1 tablet (20 mEq total) by mouth 3 (three) times daily.   spironolactone (ALDACTONE) 25 MG tablet Take 1 tablet (25 mg total) by mouth daily.   No current facility-administered medications for this visit. (Other)   REVIEW OF SYSTEMS: ROS   Positive for: Gastrointestinal, Genitourinary, Musculoskeletal, Cardiovascular, Eyes Negative for: Constitutional, Neurological, Skin, HENT, Endocrine, Respiratory, Psychiatric, Allergic/Imm, Heme/Lymph Last edited by Laddie Aquas, COA on 04/28/2023  8:01 AM.     ALLERGIES Allergies  Allergen Reactions   Lisinopril Cough   PAST MEDICAL HISTORY Past Medical History:  Diagnosis Date   Arthritis    Cancer [facility])    Cataract    OU   Colon cancer (HCC)  Family history of pancreatic cancer 08/21/2020   Family history of uterine cancer 08/21/2020   H/O cesarean section    Hx of tonsillectomy    Hyperlipidemia    Hypertension    Macular degeneration    Exu ARMD OU   Paroxysmal atrial fibrillation (HCC) 07/05/2020   Past Surgical History:  Procedure Laterality Date   BIOPSY  04/06/2020   Procedure: BIOPSY;  Surgeon: Malissa Hippo, MD;  Location: [facility];  Service: Endoscopy;;   CARPAL TUNNEL RELEASE Right    CATARACT EXTRACTION W/PHACO Left 10/11/2019   Procedure: CATARACT EXTRACTION PHACO AND INTRAOCULAR LENS PLACEMENT (IOC);  Surgeon:  Fabio Pierce, MD;  Location: [facility] ORS;  Service: Ophthalmology;  Laterality: Left;  CDE: 8.56   CATARACT EXTRACTION W/PHACO Right 10/25/2019   Procedure: CATARACT EXTRACTION PHACO AND INTRAOCULAR LENS PLACEMENT (IOC);  Surgeon: Fabio Pierce, MD;  Location: [facility] OR[facility];  Service: Ophthalmology;  Laterality: Right;  CDE: 5.67   CESAREAN SECTION     COLONOSCOPY N/A 04/06/2020   Procedure: COLONOSCOPY;  Surgeon: Malissa Hippo, MD;  Location: [facility];  Service: Endoscopy;  Laterality: N/A;   CYSTOSCOPY WITH BIOPSY N/A 04/08/2020   Procedure: CYSTOSCOPY WITH BIOPSY;  Surgeon: Malen Gauze, MD;  Location: [facility];  Service: Urology;  Laterality: N/A;   ESOPHAGOGASTRODUODENOSCOPY N/A 04/05/2020   Procedure: ESOPHAGOGASTRODUODENOSCOPY (EGD);  Surgeon: Malissa Hippo, MD;  [facility]: [facility];  Service: Endoscopy;  Laterality: N/A;   PARTIAL COLECTOMY N/A 04/08/2020   Procedure: PARTIAL COLECTOMY;  Surgeon: Lucretia Roers, MD;  Location: [facility];  Service: General;  Laterality: N/A;   PORTACATH PLACEMENT Left 05/18/2020   Procedure: INSERTION PORT-A-CATH (ATTACHED CATHETER IN LEFT SUBCLAVIAN);  Surgeon: Lucretia Roers, MD;  Location: [facility];  Service: General;  Laterality: Left;   TONSILLECTOMY     FAMILY HISTORY Family History  Problem Relation Age of Onset   Macular degeneration Mother    Stroke Mother    Hypertension Mother    Dementia Mother    Lung cancer Father        dx late 23s; smoking hx   Diabetes Sister    Hypertension Sister    Leukemia Paternal Uncle        d. 40s   Cancer Maternal Aunt        ovarian or endometrial dx 32s   Pancreatic cancer Paternal Uncle        d. late 47s   SOCIAL HISTORY Social History   Tobacco Use   Smoking status: Never   Smokeless tobacco: Never  Vaping Use   Vaping Use: Never used  Substance Use Topics   Alcohol use: No   Drug use: No       OPHTHALMIC EXAM: Base Eye Exam     Visual Acuity (Snellen - Linear)        Right Left   Dist cc CF at 3' 20/70 -1   Dist ph cc NI NI    Correction: Glasses         Tonometry (Tonopen, 7:58 AM)       Right Left   Pressure 17 14         Pupils       Dark Light Shape React APD   Right 3 2 Round Brisk None   Left 3 2 Round Brisk None         Visual Fields (Counting fingers)       Left Right    Full  Full         Extraocular Movement       Right Left    Full, Ortho Full, Ortho         Neuro/Psych     Oriented x3: Yes   Mood/Affect: Normal         Dilation     Both eyes: 1.0% Mydriacyl, 2.5% Phenylephrine @ 7:58 AM           Slit Lamp and Fundus Exam     Slit Lamp Exam       Right Left   Lids/Lashes Dermatochalasis - upper lid Dermatochalasis - upper lid   Conjunctiva/Sclera 1+ Injection Trace Injection   Cornea Arcus, 3+ fine Punctpate epithelial erosions Arcus, 3+ fine Punctate epithelial erosions, mild tear film debris   Anterior Chamber Deep and quiet Deep and quiet   Iris Round and well dilated Round and well dilated   Lens PCIOL in good position, 1+ Posterior capsular opacification PC IOL in good position with open PC   Anterior Vitreous Vitreous syneresis, Posterior vitreous detachment, vitreous condensations Vitreous syneresis, Posterior vitreous detachment, silicone oil micro drops         Fundus Exam       Right Left   Disc pink and sharp, compact mild pallor, sharp, Compact, vascular loops superiorly   C/D Ratio 0.2 0.2   Macula Blunted foveal reflex, Drusen, Pigment clumping and atrophy, central thickening/pigmented disciform scar, +PED, punctate IRH superior macula within atrophy -- fading, persistent trace cystic changes overlying central scar - stable Blunted foveal reflex, central thickening, RPE clumping and atrophy, Drusen, trace cystic changes overlying PED - persistent, no heme   Vessels Vascular attenuation, Tortuous Vascular attenuation, Tortuous   Periphery Attached, mild Reticular  degeneration Attached, mild Reticular degeneration           Refraction     Wearing Rx       Sphere Cylinder Add   Right [place] Sphere +3.50   Left -0.50 Sphere +3.50    Type: PAL           IMAGING AND PROCEDURES  Imaging and Procedures for 03/21/18  OCT, Retina - OU - Both Eyes       Right Eye Quality was good. Central Foveal Thickness: 514. Progression has been stable. Findings include no SRF, abnormal foveal contour, retinal drusen , outer retinal tubulation, subretinal hyper-reflective material, disciform scar, epiretinal membrane, intraretinal fluid, pigment epithelial detachment, outer retinal atrophy (Stable improvement in sub-retinal scar and cystic changes overlying ).   Left Eye Quality was good. Central Foveal Thickness: 282. Progression has improved. Findings include no SRF, abnormal foveal contour, retinal drusen , subretinal hyper-reflective material, intraretinal fluid, pigment epithelial detachment, outer retinal atrophy (Mild interval improvement in cystic changes nasal fovea and macula overlying PED/SRHM ).   Notes *Images captured and stored on drive  Diagnosis / Impression:  Exudative ARMD OU OD: Stable improvement in sub-retinal scar and cystic changes overlying  OS: mild interval improvement in cystic changes nasal fovea and macula overlying PED/SRHM  Clinical management:  See below  Abbreviations: NFP - Normal foveal profile. CME - cystoid macular edema. PED - pigment epithelial detachment. IRF - intraretinal fluid. SRF - subretinal fluid. EZ - ellipsoid zone. ERM - epiretinal membrane. ORA - outer retinal atrophy. ORT - outer retinal tubulation. SRHM - subretinal hyper-reflective material      Intravitreal Injection, Pharmacologic Agent - OS - Left Eye       Time Out 04/28/2023. 8:45  AM. Confirmed correct patient, procedure, site, and patient consented.   Anesthesia Topical anesthesia was used. Anesthetic medications included Lidocaine  2%, Proparacaine 0.5%.   Procedure Preparation included 5% betadine to ocular surface, eyelid speculum. A (32g) needle was used.   Injection: 6 mg faricimab-svoa 6 MG/0.05ML   Route: Intravitreal, Site: Left Eye   NDC: O8010301, Lot: [id number], Expiration date: 03/04/2025, Waste: 0 mL   Post-op Post injection exam found visual acuity of at least counting fingers. The patient tolerated the procedure well. There were no complications. The patient received written and verbal post procedure care education. Post injection medications were not given.            ASSESSMENT/PLAN:    ICD-10-CM   1. Exudative age-related macular degeneration of both eyes with active choroidal neovascularization (HCC)  H35.3231 OCT, Retina - OU - Both Eyes    Intravitreal Injection, Pharmacologic Agent - OS - Left Eye    faricimab-svoa (VABYSMO) 6mg /0.47mL intravitreal injection    2. Posterior vitreous detachment of both eyes  H43.813     3. Essential hypertension  I10     4. Hypertensive retinopathy of both eyes  H35.033     5. Pseudophakia of both eyes  Z96.1     6. Ocular hypertension, bilateral  H40.053     7. Dry eyes  H04.123      1. Exudative age-related macular degeneration, both eyes - severe exudative disease with very active CNVM OU at presentation in January 2019  - S/P IVA OD #1 (01.04.19), #2 (02.15.19)  - S/P IVA OS #1 (01.18.19), #2 (02.15.19)  - switched to Eylea 3.18.19 due to severity of disease - S/P IVE OD #1 (03.18.19), #2 (04.17.19), #3 (05.15.19), #4 (10.02.19),  #5 (01.15.20), #6 (06.04.20), #7 (10.29.20), #8 (01.21.21), #9 (05.27.21), #10 (07.22.21) -- injections held due to stable disciform scar - S/P IVE OS #1 (03.18.19), #2 (04.17.19), #3 (05.15.19), #4 (06.12.19), #5 (07.10.19), #6 (08.07.19), #7 (09.04.19), #8 (10.02.19), #9 (11.06.19), #10 (12.11.19), #11 (01.15.20), #12 (02.20.20), #13 (03.26.20), #14 (04.30.20), #15 (06.04.20), #16 (07.16.20), # 17 (08.20.20), #18  (09.24.20), #19 (10.29.20), #20 (12.03.20), #21 (01.21.21), #22 (03.18.21), #23 (05.27.21), #24 (07.22.21), #25 (11.11.21), #26 (12.09.21), #27 (01.06.22), #28 (2.7.22), #29 (03.24.22), #30 (4.28.22), #31 (05.31.22), #32 (06.30.22), #33 (08.04.22), #34 (09.15.22), #35 (10.27.22), #36 (12.08.22), #37 (01.12.23), #38 (02.16.23), #39 (03.30.23), #40 (05.11.23), #41 (06.22.23), #42 (07.27.23), #43 (09.01.23), #44 (10.06.23), #45 (11.10.23), #46 (12.15.23) -- IVE resistance  - s/p IVV OS #1 (01.12.24 -- sample), #2 (02.09.24), #3 (03.15.24), #4 (04.19.24)  - IVE OS held from 7.22.21 to 11.11.21 due to TIA/stroke on 8.1.21 - OCT OD: Stable improvement in sub-retinal scar and  cystic changes overlying; OS: mild interval improvement in cystic changes nasal fovea and macula overlying PED/SRHM  - exam OD with focal IRH superior macula within atrophy  - BCVA OD stable at CF 3'; OS 20/70 from 20/80  - recommend IVV OS #5 today, 05.24.24 w/ f/u in 5 wks - will cont to hold OD - IVV authorization obtained - RBA of procedure discussed, questions answered - IVV informed consent obtained and signed on 01.12.24 - see procedure note   - Eylea informed consent form re-signed and scanned on 07.27.2023  - Good Days approved 12/05/2020 - 12/04/2022 - f/u in 5 weeks, sooner prn for DFE/OCT/possible injection(s)  2. PVD / vitreous syneresis  - Discussed findings and prognosis   - No RT or RD on 360 exam  - Reviewed s/s of RT/RD  -  strict return precautions for any such RT/RD symptoms  3,4. Hypertensive retinopathy OU - exam shows persistent focal IRH OD within GA - discussed importance of tight BP control - monitor  5. Pseudophakia OU  - s/p CE/IOL (Dr. June Leap, 11.2020)  - beautiful surgeries w/ IOLs in excellent position, doing well  - monitor  6. Ocular Hypertension OU  - IOP 17,14  - cont cosopt BID OU   7. Dry eyes OU - recommend artificial tears and lubricating ointment as needed  Ophthalmic Meds  Ordered this visit:  Meds ordered this encounter  Medications   faricimab-svoa (VABYSMO) 6mg /0.63mL intravitreal injection     Return in about 5 weeks (around 06/02/2023) for f/u exu ARMD OU, DFE, OCT.  There are no Patient Instructions on file for this visit.  This document serves as a record of services personally performed by Karie Chimera, MD, PhD. It was created on their behalf by Glee Arvin. Manson Passey, OA an ophthalmic technician. The creation of this record is the provider's dictation and/or activities during the visit.    Electronically signed by: Glee Arvin. Manson Passey, [place] 05.24.2024 1:36 AM  Karie Chimera, M.D., Ph.D. Diseases & Surgery of the Retina and Vitreous [facility] 04/28/2023   I have reviewed the above documentation for accuracy and completeness, and I agree with the above. Karie Chimera, M.D., Ph.D. 05/01/23 1:39 AM   Abbreviations: M myopia (nearsighted); A astigmatism; H hyperopia (farsighted); P presbyopia; Mrx spectacle prescription;  CTL contact lenses; OD right eye; OS left eye; OU both eyes  XT exotropia; ET esotropia; PEK punctate epithelial keratitis; PEE punctate epithelial erosions; DES dry eye syndrome; MGD meibomian gland dysfunction; ATs artificial tears; PFAT's preservative free artificial tears; NSC nuclear sclerotic cataract; PSC posterior subcapsular cataract; ERM epi-retinal membrane; PVD posterior vitreous detachment; RD retinal detachment; DM diabetes mellitus; DR diabetic retinopathy; NPDR non-proliferative diabetic retinopathy; PDR proliferative diabetic retinopathy; CSME clinically significant macular edema; DME diabetic macular edema; dbh dot blot hemorrhages; CWS cotton wool spot; POAG primary open angle glaucoma; C/D cup-to-disc ratio; HVF humphrey visual field; GVF goldmann visual field; OCT optical coherence tomography; IOP intraocular pressure; BRVO Branch retinal vein occlusion; CRVO central retinal vein occlusion; CRAO  central retinal artery occlusion; BRAO branch retinal artery occlusion; RT retinal tear; SB scleral buckle; PPV pars plana vitrectomy; VH Vitreous hemorrhage; PRP panretinal laser photocoagulation; IVK intravitreal kenalog; VMT vitreomacular traction; MH Macular hole;  NVD neovascularization of the disc; NVE neovascularization elsewhere; AREDS age related eye disease study; ARMD age related macular degeneration; POAG primary open angle glaucoma; EBMD epithelial/anterior basement membrane dystrophy; ACIOL anterior chamber intraocular lens; IOL intraocular lens; PCIOL posterior chamber intraocular lens; Phaco/IOL phacoemulsification with intraocular lens placement; PRK photorefractive keratectomy; LASIK laser assisted in situ keratomileusis; HTN hypertension; DM diabetes mellitus; COPD chronic obstructive pulmonary disease

## 2023-04-28 ENCOUNTER — Ambulatory Visit (INDEPENDENT_AMBULATORY_CARE_PROVIDER_SITE_OTHER): Payer: Medicare HMO | Admitting: Ophthalmology

## 2023-04-28 ENCOUNTER — Encounter (INDEPENDENT_AMBULATORY_CARE_PROVIDER_SITE_OTHER): Payer: Self-pay | Admitting: Ophthalmology

## 2023-04-28 DIAGNOSIS — I10 Essential (primary) hypertension: Secondary | ICD-10-CM

## 2023-04-28 DIAGNOSIS — H353231 Exudative age-related macular degeneration, bilateral, with active choroidal neovascularization: Secondary | ICD-10-CM | POA: Diagnosis not present

## 2023-04-28 DIAGNOSIS — H40053 Ocular hypertension, bilateral: Secondary | ICD-10-CM

## 2023-04-28 DIAGNOSIS — H43813 Vitreous degeneration, bilateral: Secondary | ICD-10-CM

## 2023-04-28 DIAGNOSIS — H04123 Dry eye syndrome of bilateral lacrimal glands: Secondary | ICD-10-CM | POA: Diagnosis not present

## 2023-04-28 DIAGNOSIS — Z961 Presence of intraocular lens: Secondary | ICD-10-CM

## 2023-04-28 DIAGNOSIS — H35033 Hypertensive retinopathy, bilateral: Secondary | ICD-10-CM

## 2023-04-28 MED ORDER — FARICIMAB-SVOA 6 MG/0.05ML IZ SOLN
6.0000 mg | INTRAVITREAL | Status: AC | PRN
Start: 2023-04-28 — End: 2023-04-28
  Administered 2023-04-28: 6 mg via INTRAVITREAL

## 2023-05-03 ENCOUNTER — Inpatient Hospital Stay: Payer: Medicare HMO

## 2023-05-03 ENCOUNTER — Ambulatory Visit: Payer: Medicare HMO | Admitting: Hematology

## 2023-05-03 VITALS — BP 155/76 | HR 70 | Temp 97.1°F | Resp 18 | Wt 179.9 lb

## 2023-05-03 DIAGNOSIS — C182 Malignant neoplasm of ascending colon: Secondary | ICD-10-CM | POA: Diagnosis not present

## 2023-05-03 DIAGNOSIS — Z79620 Long term (current) use of immunosuppressive biologic: Secondary | ICD-10-CM | POA: Diagnosis not present

## 2023-05-03 DIAGNOSIS — C189 Malignant neoplasm of colon, unspecified: Secondary | ICD-10-CM

## 2023-05-03 DIAGNOSIS — Z5112 Encounter for antineoplastic immunotherapy: Secondary | ICD-10-CM | POA: Diagnosis not present

## 2023-05-03 LAB — CBC WITH DIFFERENTIAL/PLATELET
Abs Immature Granulocytes: 0.03 10*3/uL (ref 0.00–0.07)
Basophils Absolute: 0 10*3/uL (ref 0.0–0.1)
Basophils Relative: 1 %
Eosinophils Absolute: 0.3 10*3/uL (ref 0.0–0.5)
Eosinophils Relative: 4 %
HCT: 36.8 % (ref 36.0–46.0)
Hemoglobin: 12.1 g/dL (ref 12.0–15.0)
Immature Granulocytes: 1 %
Lymphocytes Relative: 20 %
Lymphs Abs: 1.4 10*3/uL (ref 0.7–4.0)
MCH: 31.1 pg (ref 26.0–34.0)
MCHC: 32.9 g/dL (ref 30.0–36.0)
MCV: 94.6 fL (ref 80.0–100.0)
Monocytes Absolute: 0.7 10*3/uL (ref 0.1–1.0)
Monocytes Relative: 11 %
Neutro Abs: 4.2 10*3/uL (ref 1.7–7.7)
Neutrophils Relative %: 63 %
Platelets: 161 10*3/uL (ref 150–400)
RBC: 3.89 MIL/uL (ref 3.87–5.11)
RDW: 12.7 % (ref 11.5–15.5)
WBC: 6.7 10*3/uL (ref 4.0–10.5)
nRBC: 0 % (ref 0.0–0.2)

## 2023-05-03 LAB — COMPREHENSIVE METABOLIC PANEL
ALT: 15 U/L (ref 0–44)
AST: 19 U/L (ref 15–41)
Albumin: 4.1 g/dL (ref 3.5–5.0)
Alkaline Phosphatase: 70 U/L (ref 38–126)
Anion gap: 12 (ref 5–15)
BUN: 21 mg/dL (ref 8–23)
CO2: 23 mmol/L (ref 22–32)
Calcium: 10.1 mg/dL (ref 8.9–10.3)
Chloride: 93 mmol/L — ABNORMAL LOW (ref 98–111)
Creatinine, Ser: 1.24 mg/dL — ABNORMAL HIGH (ref 0.44–1.00)
GFR, Estimated: 45 mL/min — ABNORMAL LOW (ref >60)
Glucose, Bld: 111 mg/dL — ABNORMAL HIGH (ref 70–99)
Potassium: 4.6 mmol/L (ref 3.5–5.1)
Sodium: 128 mmol/L — ABNORMAL LOW (ref 135–145)
Total Bilirubin: 0.5 mg/dL (ref 0.3–1.2)
Total Protein: 7.4 g/dL (ref 6.5–8.1)

## 2023-05-03 LAB — MAGNESIUM: Magnesium: 1.8 mg/dL (ref 1.7–2.4)

## 2023-05-03 MED ORDER — HEPARIN SOD (PORK) LOCK FLUSH 100 UNIT/ML IV SOLN
500.0000 [IU] | Freq: Once | INTRAVENOUS | Status: AC | PRN
  Administered 2023-05-03: 500 [IU]

## 2023-05-03 MED ORDER — SODIUM CHLORIDE 0.9 % IV SOLN: Freq: Once | INTRAVENOUS | Status: AC

## 2023-05-03 MED ORDER — SODIUM CHLORIDE 0.9% FLUSH
10.0000 mL | INTRAVENOUS | Status: DC | PRN
  Administered 2023-05-03: 10 mL

## 2023-05-03 MED ORDER — SODIUM CHLORIDE 0.9 % IV SOLN
200.0000 mg | Freq: Once | INTRAVENOUS | Status: AC
  Administered 2023-05-03: 200 mg via INTRAVENOUS
  Filled 2023-05-03: qty 8

## 2023-05-03 NOTE — Progress Notes (Signed)
Patient presents today for Keytruda infusion.  Patient is in satisfactory condition with no new complaints voiced.  Vital signs are stable.  Labs reviewed and all labs are within treatment parameters.  We will proceed with treatment per MD orders.    Treatment given today per MD orders. Tolerated infusion without adverse affects. Vital signs stable. No complaints at this time. Discharged from clinic ambulatory in stable condition. Alert and oriented x 3. F/U with [facility] as scheduled.

## 2023-05-03 NOTE — Patient Instructions (Signed)
[facility]  Discharge Instructions: Thank you for choosing [facility] to provide your oncology and hematology care.  If you have a lab appointment with the Cancer Center - please note that after April 8th, 2024, all labs will be drawn in the cancer center.  You do not have to check in or register with the main entrance as you have in the past but will complete your check-in in the cancer center.  Wear comfortable clothing and clothing appropriate for easy access to any Portacath or PICC line.   We strive to give you quality time with your provider. You may need to reschedule your appointment if you arrive late (15 or more minutes).  Arriving late affects you and other patients whose appointments are after yours.  Also, if you miss three or more appointments without notifying the office, you may be dismissed from the clinic at the provider's discretion.      For prescription refill requests, have your pharmacy contact our office and allow 72 hours for refills to be completed.    Today you received the following chemotherapy and/or immunotherapy agents Keytruda   To help prevent nausea and vomiting after your treatment, we encourage you to take your nausea medication as directed.  Pembrolizumab Injection What is this medication? PEMBROLIZUMAB (PEM broe LIZ ue mab) treats some types of cancer. It works by helping your immune system slow or stop the spread of cancer cells. It is a monoclonal antibody. This medicine may be used for other purposes; ask your health care provider or pharmacist if you have questions. COMMON BRAND NAME(S): Keytruda What should I tell my care team before I take this medication? They need to know if you have any of these conditions: Allogeneic stem cell transplant (uses someone else's stem cells) Autoimmune diseases, such as Crohn disease, ulcerative colitis, lupus History of chest radiation Nervous system problems, such as  Guillain-Barre syndrome, myasthenia gravis Organ transplant An unusual or allergic reaction to pembrolizumab, other medications, foods, dyes, or preservatives Pregnant or trying to get pregnant Breast-feeding How should I use this medication? This medication is injected into a vein. It is given by your care team in a hospital or clinic setting. A special MedGuide will be given to you before each treatment. Be sure to read this information carefully each time. Talk to your care team about the use of this medication in children. While it may be prescribed for children as young as 6 months for selected conditions, precautions do apply. Overdosage: If you think you have taken too much of this medicine contact a poison control center or emergency room at once. NOTE: This medicine is only for you. Do not share this medicine with others. What if I miss a dose? Keep appointments for follow-up doses. It is important not to miss your dose. Call your care team if you are unable to keep an appointment. What may interact with this medication? Interactions have not been studied. This list may not describe all possible interactions. Give your health care provider a list of all the medicines, herbs, non-prescription drugs, or dietary supplements you use. Also tell them if you smoke, drink alcohol, or use illegal drugs. Some items may interact with your medicine. What should I watch for while using this medication? Your condition will be monitored carefully while you are receiving this medication. You may need blood work while taking this medication. This medication may cause serious skin reactions. They can happen weeks to months   after starting the medication. Contact your care team right away if you notice fevers or flu-like symptoms with a rash. The rash may be red or purple and then turn into blisters or peeling of the skin. You may also notice a red rash with swelling of the face, lips, or lymph nodes in your  neck or under your arms. Tell your care team right away if you have any change in your eyesight. Talk to your care team if you may be pregnant. Serious birth defects can occur if you take this medication during pregnancy and for 4 months after the last dose. You will need a negative pregnancy test before starting this medication. Contraception is recommended while taking this medication and for 4 months after the last dose. Your care team can help you find the option that works for you. Do not breastfeed while taking this medication and for 4 months after the last dose. What side effects may I notice from receiving this medication? Side effects that you should report to your care team as soon as possible: Allergic reactions--skin rash, itching, hives, swelling of the face, lips, tongue, or throat Dry cough, shortness of breath or trouble breathing Eye pain, redness, irritation, or discharge with blurry or decreased vision Heart muscle inflammation--unusual weakness or fatigue, shortness of breath, chest pain, fast or irregular heartbeat, dizziness, swelling of the ankles, feet, or hands Hormone gland problems--headache, sensitivity to light, unusual weakness or fatigue, dizziness, fast or irregular heartbeat, increased sensitivity to cold or heat, excessive sweating, constipation, hair loss, increased thirst or amount of urine, tremors or shaking, irritability Infusion reactions--chest pain, shortness of breath or trouble breathing, feeling faint or lightheaded Kidney injury (glomerulonephritis)--decrease in the amount of urine, red or dark brown urine, foamy or bubbly urine, swelling of the ankles, hands, or feet Liver injury--right upper belly pain, loss of appetite, nausea, light-colored stool, dark yellow or brown urine, yellowing skin or eyes, unusual weakness or fatigue Pain, tingling, or numbness in the hands or feet, muscle weakness, change in vision, confusion or trouble speaking, loss of  balance or coordination, trouble walking, seizures Rash, fever, and swollen lymph nodes Redness, blistering, peeling, or loosening of the skin, including inside the mouth Sudden or severe stomach pain, bloody diarrhea, fever, nausea, vomiting Side effects that usually do not require medical attention (report to your care team if they continue or are bothersome): Bone, joint, or muscle pain Diarrhea Fatigue Loss of appetite Nausea Skin rash This list may not describe all possible side effects. Call your doctor for medical advice about side effects. You may report side effects to FDA at 1-800-FDA-1088. Where should I keep my medication? This medication is given in a hospital or clinic. It will not be stored at home. NOTE: This sheet is a summary. It may not cover all possible information. If you have questions about this medicine, talk to your doctor, pharmacist, or health care provider.  2024 Elsevier/Gold Standard (2022-04-05 00:00:00)   BELOW ARE SYMPTOMS THAT SHOULD BE REPORTED IMMEDIATELY: *FEVER GREATER THAN 100.4 F (38 C) OR HIGHER *CHILLS OR SWEATING *NAUSEA AND VOMITING THAT IS NOT CONTROLLED WITH YOUR NAUSEA MEDICATION *UNUSUAL SHORTNESS OF BREATH *UNUSUAL BRUISING OR BLEEDING *URINARY PROBLEMS (pain or burning when urinating, or frequent urination) *BOWEL PROBLEMS (unusual diarrhea, constipation, pain near the anus) TENDERNESS IN MOUTH AND THROAT WITH OR WITHOUT PRESENCE OF ULCERS (sore throat, sores in mouth, or a toothache) UNUSUAL RASH, SWELLING OR PAIN  UNUSUAL VAGINAL DISCHARGE OR ITCHING   Items   with * indicate a potential emergency and should be followed up as soon as possible or go to the Emergency Department if any problems should occur.  Please show the CHEMOTHERAPY ALERT CARD or IMMUNOTHERAPY ALERT CARD at check-in to the Emergency Department and triage nurse.  Should you have questions after your visit or need to cancel or reschedule your appointment, please  contact [facility] [phone]  and follow the prompts.  Office hours are 8:00 a.m. to 4:30 p.m. Monday - Friday. Please note that voicemails left after 4:00 p.m. may not be returned until the following business day.  We are closed weekends and major holidays. You have access to a nurse at all times for urgent questions. Please call the main number to the clinic [phone] and follow the prompts.  For any non-urgent questions, you may also contact your provider using MyChart. We now offer e-Visits for anyone 18 and older to request care online for non-urgent symptoms. For details visit [email or web address].   Also download the MyChart app! Go to the app store, search "MyChart", open the app, select [facility], and log in with your MyChart username and password.   

## 2023-05-11 ENCOUNTER — Other Ambulatory Visit (HOSPITAL_COMMUNITY): Payer: Medicare HMO

## 2023-05-17 ENCOUNTER — Other Ambulatory Visit: Payer: Self-pay | Admitting: Family Medicine

## 2023-05-17 DIAGNOSIS — J069 Acute upper respiratory infection, unspecified: Secondary | ICD-10-CM

## 2023-05-17 NOTE — Telephone Encounter (Signed)
Last filled 04/24/2023 #20 no refills - lov 04/24/2023 next ov 06/26/2023

## 2023-05-25 ENCOUNTER — Inpatient Hospital Stay: Payer: Medicare HMO | Attending: Hematology

## 2023-05-25 ENCOUNTER — Ambulatory Visit: Payer: Medicare HMO | Admitting: Hematology

## 2023-05-25 ENCOUNTER — Inpatient Hospital Stay: Payer: Medicare HMO

## 2023-05-25 VITALS — BP 148/60 | HR 68 | Temp 96.8°F | Resp 20 | Wt 181.2 lb

## 2023-05-25 DIAGNOSIS — C182 Malignant neoplasm of ascending colon: Secondary | ICD-10-CM | POA: Insufficient documentation

## 2023-05-25 DIAGNOSIS — C189 Malignant neoplasm of colon, unspecified: Secondary | ICD-10-CM

## 2023-05-25 DIAGNOSIS — Z5112 Encounter for antineoplastic immunotherapy: Secondary | ICD-10-CM | POA: Diagnosis not present

## 2023-05-25 DIAGNOSIS — E876 Hypokalemia: Secondary | ICD-10-CM | POA: Insufficient documentation

## 2023-05-25 LAB — CBC WITH DIFFERENTIAL/PLATELET
Abs Immature Granulocytes: 0.03 10*3/uL (ref 0.00–0.07)
Basophils Absolute: 0 10*3/uL (ref 0.0–0.1)
Basophils Relative: 0 %
Eosinophils Absolute: 0.2 10*3/uL (ref 0.0–0.5)
Eosinophils Relative: 4 %
HCT: 36.5 % (ref 36.0–46.0)
Hemoglobin: 12.3 g/dL (ref 12.0–15.0)
Immature Granulocytes: 1 %
Lymphocytes Relative: 21 %
Lymphs Abs: 1.2 10*3/uL (ref 0.7–4.0)
MCH: 31 pg (ref 26.0–34.0)
MCHC: 33.7 g/dL (ref 30.0–36.0)
MCV: 91.9 fL (ref 80.0–100.0)
Monocytes Absolute: 0.6 10*3/uL (ref 0.1–1.0)
Monocytes Relative: 11 %
Neutro Abs: 3.6 10*3/uL (ref 1.7–7.7)
Neutrophils Relative %: 63 %
Platelets: 175 10*3/uL (ref 150–400)
RBC: 3.97 MIL/uL (ref 3.87–5.11)
RDW: 12.5 % (ref 11.5–15.5)
WBC: 5.6 10*3/uL (ref 4.0–10.5)
nRBC: 0 % (ref 0.0–0.2)

## 2023-05-25 LAB — COMPREHENSIVE METABOLIC PANEL
ALT: 16 U/L (ref 0–44)
AST: 17 U/L (ref 15–41)
Albumin: 4.4 g/dL (ref 3.5–5.0)
Alkaline Phosphatase: 71 U/L (ref 38–126)
Anion gap: 9 (ref 5–15)
BUN: 24 mg/dL — ABNORMAL HIGH (ref 8–23)
CO2: 25 mmol/L (ref 22–32)
Calcium: 9.9 mg/dL (ref 8.9–10.3)
Chloride: 93 mmol/L — ABNORMAL LOW (ref 98–111)
Creatinine, Ser: 1.22 mg/dL — ABNORMAL HIGH (ref 0.44–1.00)
GFR, Estimated: 46 mL/min — ABNORMAL LOW (ref >60)
Glucose, Bld: 104 mg/dL — ABNORMAL HIGH (ref 70–99)
Potassium: 4.4 mmol/L (ref 3.5–5.1)
Sodium: 127 mmol/L — ABNORMAL LOW (ref 135–145)
Total Bilirubin: 0.5 mg/dL (ref 0.3–1.2)
Total Protein: 7.9 g/dL (ref 6.5–8.1)

## 2023-05-25 LAB — MAGNESIUM: Magnesium: 1.9 mg/dL (ref 1.7–2.4)

## 2023-05-25 MED ORDER — SODIUM CHLORIDE 0.9% FLUSH
10.0000 mL | INTRAVENOUS | Status: DC | PRN
  Administered 2023-05-25: 10 mL

## 2023-05-25 MED ORDER — HEPARIN SOD (PORK) LOCK FLUSH 100 UNIT/ML IV SOLN
500.0000 [IU] | Freq: Once | INTRAVENOUS | Status: AC | PRN
  Administered 2023-05-25: 500 [IU]

## 2023-05-25 MED ORDER — SODIUM CHLORIDE 0.9 % IV SOLN
200.0000 mg | Freq: Once | INTRAVENOUS | Status: AC
  Administered 2023-05-25: 200 mg via INTRAVENOUS
  Filled 2023-05-25: qty 8

## 2023-05-25 MED ORDER — SODIUM CHLORIDE 0.9 % IV SOLN: Freq: Once | INTRAVENOUS | Status: AC

## 2023-05-25 NOTE — Progress Notes (Signed)
Treatment given per orders. Patient tolerated it well without problems. Vitals stable and discharged home from clinic ambulatory. Follow up as scheduled.  

## 2023-05-25 NOTE — Patient Instructions (Signed)
[facility]  Discharge Instructions: Thank you for choosing [facility] to provide your oncology and hematology care.  If you have a lab appointment with the Cancer Center - please note that after April 8th, 2024, all labs will be drawn in the cancer center.  You do not have to check in or register with the main entrance as you have in the past but will complete your check-in in the cancer center.  Wear comfortable clothing and clothing appropriate for easy access to any Portacath or PICC line.   We strive to give you quality time with your provider. You may need to reschedule your appointment if you arrive late (15 or more minutes).  Arriving late affects you and other patients whose appointments are after yours.  Also, if you miss three or more appointments without notifying the office, you may be dismissed from the clinic at the provider's discretion.      For prescription refill requests, have your pharmacy contact our office and allow 72 hours for refills to be completed.    Today you received the following chemotherapy and/or immunotherapy agents Keytruda      To help prevent nausea and vomiting after your treatment, we encourage you to take your nausea medication as directed.  BELOW ARE SYMPTOMS THAT SHOULD BE REPORTED IMMEDIATELY: *FEVER GREATER THAN 100.4 F (38 C) OR HIGHER *CHILLS OR SWEATING *NAUSEA AND VOMITING THAT IS NOT CONTROLLED WITH YOUR NAUSEA MEDICATION *UNUSUAL SHORTNESS OF BREATH *UNUSUAL BRUISING OR BLEEDING *URINARY PROBLEMS (pain or burning when urinating, or frequent urination) *BOWEL PROBLEMS (unusual diarrhea, constipation, pain near the anus) TENDERNESS IN MOUTH AND THROAT WITH OR WITHOUT PRESENCE OF ULCERS (sore throat, sores in mouth, or a toothache) UNUSUAL RASH, SWELLING OR PAIN  UNUSUAL VAGINAL DISCHARGE OR ITCHING   Items with * indicate a potential emergency and should be followed up as soon as possible or go to the  Emergency Department if any problems should occur.  Please show the CHEMOTHERAPY ALERT CARD or IMMUNOTHERAPY ALERT CARD at check-in to the Emergency Department and triage nurse.  Should you have questions after your visit or need to cancel or reschedule your appointment, please contact [facility] [phone]  and follow the prompts.  Office hours are 8:00 a.m. to 4:30 p.m. Monday - Friday. Please note that voicemails left after 4:00 p.m. may not be returned until the following business day.  We are closed weekends and major holidays. You have access to a nurse at all times for urgent questions. Please call the main number to the clinic [phone] and follow the prompts.  For any non-urgent questions, you may also contact your provider using MyChart. We now offer e-Visits for anyone 18 and older to request care online for non-urgent symptoms. For details visit [email or web address].   Also download the MyChart app! Go to the app store, search "MyChart", open the app, select [facility], and log in with your MyChart username and password.   

## 2023-05-29 NOTE — Progress Notes (Signed)
[facility] - Clinic Note  06/05/2023    CHIEF COMPLAINT Patient presents for Retina Follow Up  HISTORY OF PRESENT ILLNESS: Gabrielle [name] is a 77 y.o. female who presents to the clinic today for:   HPI     Retina Follow Up   Patient presents with  Wet AMD.  In both eyes.  This started 5 weeks ago.  Duration of 5 weeks.  Since onset it is stable.  I, the attending physician,  performed the HPI with the patient and updated documentation appropriately.        Comments   5 week retina follow up ARMD OU and IVV OS pt is reporting no vision changes noticed she denies any flashes or floaters       Last edited by [name], MD on 06/05/2023 12:33 PM.      Patient states   Referring physician: [name], DO [address] [place],  [place] [address]  HISTORICAL INFORMATION:   Selected notes from the MEDICAL RECORD NUMBER Referred by Dr. Mercie Eon for concern of ARMD OU   CURRENT MEDICATIONS: Current Outpatient Medications (Ophthalmic Drugs)  Medication Sig   dorzolamide-timolol (COSOPT) 2-0.5 % ophthalmic solution INSTILL ONE DROP IN [facility] EYE TWICE DAILY   dorzolamide-timolol (COSOPT) 2-0.5 % ophthalmic solution Place 1 drop into the right eye 2 (two) times daily.   No current facility-administered medications for this visit. (Ophthalmic Drugs)   Current Outpatient Medications (Other)  Medication Sig   alendronate (FOSAMAX) 70 MG tablet TAKE ONE TABLET EVERY 7 DAYS ON AN EMPTY STOMACH WITH A FULL GLASS OF WATER   amLODipine (NORVASC) 10 MG tablet Take 1 tablet (10 mg total) by mouth daily. For blood pressure   atorvastatin (LIPITOR) 20 MG tablet Take 1 tablet (20 mg total) by mouth daily. For cholesterol   benzonatate (TESSALON) 100 MG capsule TAKE ONE CAPSULE TWICE DAILY AS NEEDED FOR COUGH   cholecalciferol (VITAMIN D3) 25 MCG (1000 UT) tablet Take 1,000 Units by mouth daily.   CRANBERRY FRUIT PO Take 1 tablet by mouth daily.   diclofenac  Sodium (VOLTAREN) 1 % GEL APPLY 4 GRAMS TO AFFECTED AREA(S) 4 TIMES A DAY   ELIQUIS 5 MG TABS tablet Take 1 tablet (5 mg total) by mouth 2 (two) times daily.   fluticasone (FLONASE) 50 MCG/ACT nasal spray Place 2 sprays into both nostrils daily.   hydrALAZINE (APRESOLINE) 25 MG tablet Take 1 tablet (25 mg total) by mouth 2 (two) times daily.   losartan-hydrochlorothiazide (HYZAAR) 100-25 MG tablet Take 1 tablet by mouth daily.   potassium chloride SA (KLOR-CON M) 20 MEQ tablet Take 1 tablet (20 mEq total) by mouth 3 (three) times daily.   spironolactone (ALDACTONE) 25 MG tablet Take 1 tablet (25 mg total) by mouth daily.   No current facility-administered medications for this visit. (Other)   REVIEW OF SYSTEMS: ROS   Positive for: Gastrointestinal, Genitourinary, Musculoskeletal, Cardiovascular, Eyes Negative for: Constitutional, Neurological, Skin, HENT, Endocrine, Respiratory, Psychiatric, Allergic/Imm, Heme/Lymph Last edited by Etheleen Mayhew, COT on 06/05/2023  7:42 AM.      ALLERGIES Allergies  Allergen Reactions   Lisinopril Cough   PAST MEDICAL HISTORY Past Medical History:  Diagnosis Date   Arthritis    Cancer [facility])    Cataract    OU   Colon cancer (HCC)    Family history of pancreatic cancer 08/21/2020   Family history of uterine cancer 08/21/2020   H/O cesarean section  Hx of tonsillectomy    Hyperlipidemia    Hypertension    Macular degeneration    Exu ARMD OU   Paroxysmal atrial fibrillation (HCC) 07/05/2020   Past Surgical History:  Procedure Laterality Date   BIOPSY  04/06/2020   Procedure: BIOPSY;  Surgeon: Malissa Hippo, MD;  Location: [facility];  Service: Endoscopy;;   CARPAL TUNNEL RELEASE Right    CATARACT EXTRACTION W/PHACO Left 10/11/2019   Procedure: CATARACT EXTRACTION PHACO AND INTRAOCULAR LENS PLACEMENT (IOC);  Surgeon: Fabio Pierce, MD;  Location: [facility];  Service: Ophthalmology;  Laterality: Left;  CDE: 8.56   CATARACT EXTRACTION  W/PHACO Right 10/25/2019   Procedure: CATARACT EXTRACTION PHACO AND INTRAOCULAR LENS PLACEMENT (IOC);  Surgeon: Fabio Pierce, MD;  Location: [facility];  Service: Ophthalmology;  Laterality: Right;  CDE: 5.67   CESAREAN SECTION     COLONOSCOPY N/A 04/06/2020   Procedure: COLONOSCOPY;  Surgeon: Malissa Hippo, MD;  Location: [facility];  Service: Endoscopy;  Laterality: N/A;   CYSTOSCOPY WITH BIOPSY N/A 04/08/2020   Procedure: CYSTOSCOPY WITH BIOPSY;  Surgeon: Malen Gauze, MD;  Location: [facility];  Service: Urology;  Laterality: N/A;   ESOPHAGOGASTRODUODENOSCOPY N/A 04/05/2020   Procedure: ESOPHAGOGASTRODUODENOSCOPY (EGD);  Surgeon: Malissa Hippo, MD;  [facility]: [facility];  Service: Endoscopy;  Laterality: N/A;   PARTIAL COLECTOMY N/A 04/08/2020   Procedure: PARTIAL COLECTOMY;  Surgeon: Lucretia Roers, MD;  Location: [facility];  Service: General;  Laterality: N/A;   PORTACATH PLACEMENT Left 05/18/2020   Procedure: INSERTION PORT-A-CATH (ATTACHED CATHETER IN LEFT SUBCLAVIAN);  Surgeon: Lucretia Roers, MD;  Location: [facility];  Service: General;  Laterality: Left;   TONSILLECTOMY     FAMILY HISTORY Family History  Problem Relation Age of Onset   Macular degeneration Mother    Stroke Mother    Hypertension Mother    Dementia Mother    Lung cancer Father        dx late 90s; smoking hx   Diabetes Sister    Hypertension Sister    Leukemia Paternal Uncle        d. 29s   Cancer Maternal Aunt        ovarian or endometrial dx 31s   Pancreatic cancer Paternal Uncle        d. late 38s   SOCIAL HISTORY Social History   Tobacco Use   Smoking status: Never   Smokeless tobacco: Never  Vaping Use   Vaping Use: Never used  Substance Use Topics   Alcohol use: No   Drug use: No       OPHTHALMIC EXAM: Base Eye Exam     Visual Acuity (Snellen - Linear)       Right Left   Dist cc CF at 3' 20/70 -2   Dist ph cc  NI    Correction: Glasses         Tonometry (Tonopen, 7:45  AM)       Right Left   Pressure 15 13         Pupils       Pupils Dark Light Shape React APD   Right PERRL 3 2 Round Brisk None   Left PERRL 3 2 Round Brisk None         Visual Fields       Left Right    Full Full         Extraocular Movement       Right Left  Full, Ortho Full, Ortho         Neuro/Psych     Oriented x3: Yes   Mood/Affect: Normal         Dilation     Both eyes: 2.5% Phenylephrine @ 7:45 AM           Slit Lamp and Fundus Exam     Slit Lamp Exam       Right Left   Lids/Lashes Dermatochalasis - upper lid Dermatochalasis - upper lid   Conjunctiva/Sclera 1+ Injection Trace Injection   Cornea Arcus, 3+ fine Punctpate epithelial erosions Arcus, 3+ fine Punctate epithelial erosions, mild tear film debris   Anterior Chamber Deep and quiet Deep and quiet   Iris Round and well dilated Round and well dilated   Lens PCIOL in good position, 1+ Posterior capsular opacification PC IOL in good position with open PC   Anterior Vitreous Vitreous syneresis, Posterior vitreous detachment, vitreous condensations Vitreous syneresis, Posterior vitreous detachment, silicone oil micro drops         Fundus Exam       Right Left   Disc pink and sharp, compact mild pallor, sharp, Compact, vascular loops superiorly   C/D Ratio 0.2 0.2   Macula Blunted foveal reflex, large central GA, Drusen, Pigment clumping and atrophy, central thickening/pigmented disciform scar, +PED, punctate IRH superior macula within atrophy -- fading, persistent trace cystic changes overlying central scar - stable Blunted foveal reflex, central thickening, RPE clumping and atrophy, Drusen, trace cystic changes overlying PED - stably improved, no heme   Vessels Vascular attenuation, Tortuous Vascular attenuation, Tortuous   Periphery Attached, mild Reticular degeneration Attached, mild Reticular degeneration           Refraction     Wearing Rx       Sphere Cylinder Add    Right [place] Sphere +3.50   Left -0.50 Sphere +3.50    Type: PAL           IMAGING AND PROCEDURES  Imaging and Procedures for 03/21/18  OCT, Retina - OU - Both Eyes       Right Eye Quality was good. Central Foveal Thickness: 481. Progression has been stable. Findings include no SRF, abnormal foveal contour, retinal drusen , outer retinal tubulation, subretinal hyper-reflective material, disciform scar, epiretinal membrane, intraretinal fluid, pigment epithelial detachment, outer retinal atrophy (Stable improvement in sub-retinal scar and cystic changes overlying ).   Left Eye Quality was good. Central Foveal Thickness: 269. Progression has improved. Findings include no SRF, abnormal foveal contour, retinal drusen , subretinal hyper-reflective material, intraretinal fluid, pigment epithelial detachment, outer retinal atrophy (Mild interval improvement in cystic changes nasal fovea and macula overlying PED/SRHM ).   Notes *Images captured and stored on drive  Diagnosis / Impression:  Exudative ARMD OU OD: Stable improvement in sub-retinal scar and cystic changes overlying  OS: mild interval improvement in cystic changes nasal fovea and macula overlying PED/SRHM  Clinical management:  See below  Abbreviations: NFP - Normal foveal profile. CME - cystoid macular edema. PED - pigment epithelial detachment. IRF - intraretinal fluid. SRF - subretinal fluid. EZ - ellipsoid zone. ERM - epiretinal membrane. ORA - outer retinal atrophy. ORT - outer retinal tubulation. SRHM - subretinal hyper-reflective material      Intravitreal Injection, Pharmacologic Agent - OS - Left Eye       Time Out 06/05/2023. 8:24 AM. Confirmed correct patient, procedure, site, and patient consented.   Anesthesia Topical anesthesia was used. Anesthetic medications included Lidocaine  2%, Proparacaine 0.5%.   Procedure Preparation included 5% betadine to ocular surface, eyelid speculum. A (32g) needle was  used.   Injection: 6 mg faricimab-svoa 6 MG/0.05ML   Route: Intravitreal, Site: Left Eye   NDC: 09811-914-78, Lot: [id number], Expiration date: 04/03/2025, Waste: 0 mL   Post-op Post injection exam found visual acuity of at least counting fingers. The patient tolerated the procedure well. There were no complications. The patient received written and verbal post procedure care education. Post injection medications were not given.            ASSESSMENT/PLAN:    ICD-10-CM   1. Exudative age-related macular degeneration of both eyes with active choroidal neovascularization (HCC)  H35.3231 OCT, Retina - OU - Both Eyes    Intravitreal Injection, Pharmacologic Agent - OS - Left Eye    faricimab-svoa (VABYSMO) 6mg /0.52mL intravitreal injection    2. Posterior vitreous detachment of both eyes  H43.813     3. Essential hypertension  I10     4. Hypertensive retinopathy of both eyes  H35.033     5. Pseudophakia of both eyes  Z96.1     6. Ocular hypertension, bilateral  H40.053     7. Dry eyes  H04.123      1. Exudative age-related macular degeneration, both eyes - severe exudative disease with very active CNVM OU at presentation in January 2019  - S/P IVA OD #1 (01.04.19), #2 (02.15.19)  - S/P IVA OS #1 (01.18.19), #2 (02.15.19)  - switched to Eylea 3.18.19 due to severity of disease - S/P IVE OD #1 (03.18.19), #2 (04.17.19), #3 (05.15.19), #4 (10.02.19),  #5 (01.15.20), #6 (06.04.20), #7 (10.29.20), #8 (01.21.21), #9 (05.27.21), #10 (07.22.21) -- injections held due to stable disciform scar - S/P IVE OS #1 (03.18.19), #2 (04.17.19), #3 (05.15.19), #4 (06.12.19), #5 (07.10.19), #6 (08.07.19), #7 (09.04.19), #8 (10.02.19), #9 (11.06.19), #10 (12.11.19), #11 (01.15.20), #12 (02.20.20), #13 (03.26.20), #14 (04.30.20), #15 (06.04.20), #16 (07.16.20), # 17 (08.20.20), #18 (09.24.20), #19 (10.29.20), #20 (12.03.20), #21 (01.21.21), #22 (03.18.21), #23 (05.27.21), #24 (07.22.21), #25 (11.11.21),  #26 (12.09.21), #27 (01.06.22), #28 (2.7.22), #29 (03.24.22), #30 (4.28.22), #31 (05.31.22), #32 (06.30.22), #33 (08.04.22), #34 (09.15.22), #35 (10.27.22), #36 (12.08.22), #37 (01.12.23), #38 (02.16.23), #39 (03.30.23), #40 (05.11.23), #41 (06.22.23), #42 (07.27.23), #43 (09.01.23), #44 (10.06.23), #45 (11.10.23), #46 (12.15.23) -- IVE resistance  - s/p IVV OS #1 (01.12.24 -- sample), #2 (02.09.24), #3 (03.15.24), #4 (04.19.24), #5 (05.24.24)  - IVE OS held from 7.22.21 to 11.11.21 due to TIA/stroke on 8.1.21 - OCT OD: Stable improvement in sub-retinal scar and  cystic changes overlying; OS: mild interval improvement in cystic changes nasal fovea and macula overlying PED/SRHM at 5 weeks  - exam OD with focal IRH superior macula within atrophy  - BCVA OD stable at CF 3'; OS stable at 20/70   - recommend IVV OS #6 today, 07.01.24 w/ f/u extended to 6 wks - will cont to hold OD - IVV authorization obtained - RBA of procedure discussed, questions answered - IVV informed consent obtained and signed on 01.12.24 - see procedure note   - Good Days approved for 2024 - f/u in 6 weeks, sooner prn for DFE/OCT/possible injection(s)  2. PVD / vitreous syneresis  - Discussed findings and prognosis   - No RT or RD on 360 exam  - Reviewed s/s of RT/RD  - strict return precautions for any such RT/RD symptoms  3,4. Hypertensive retinopathy OU - exam shows persistent focal IRH OD within GA - discussed  importance of tight BP control - monitor  5. Pseudophakia OU  - s/p CE/IOL (Dr. June Leap, 11.2020)  - beautiful surgeries w/ IOLs in excellent position, doing well  - monitor  6. Ocular Hypertension OU  - IOP 15,13  - cont cosopt BID OU   7. Dry eyes OU - recommend artificial tears and lubricating ointment as needed  Ophthalmic Meds Ordered this visit:  Meds ordered this encounter  Medications   faricimab-svoa (VABYSMO) 6mg /0.21mL intravitreal injection     Return in about 6 weeks (around  07/17/2023) for f/u exu ARMD OU, DFE, OCT.  There are no Patient Instructions on file for this visit.  This document serves as a record of services personally performed by Karie Chimera, MD, PhD. It was created on their behalf by Glee Arvin. Manson Passey, OA an ophthalmic technician. The creation of this record is the provider's dictation and/or activities during the visit.    Electronically signed by: Glee Arvin. Kristopher Oppenheim 06.24.2024 12:35 PM  Karie Chimera, M.D., Ph.D. Diseases & Surgery of the Retina and Vitreous [facility] 06/05/2023   I have reviewed the above documentation for accuracy and completeness, and I agree with the above. Karie Chimera, M.D., Ph.D. 06/05/23 12:36 PM  Abbreviations: M myopia (nearsighted); A astigmatism; H hyperopia (farsighted); P presbyopia; Mrx spectacle prescription;  CTL contact lenses; OD right eye; OS left eye; OU both eyes  XT exotropia; ET esotropia; PEK punctate epithelial keratitis; PEE punctate epithelial erosions; DES dry eye syndrome; MGD meibomian gland dysfunction; ATs artificial tears; PFAT's preservative free artificial tears; NSC nuclear sclerotic cataract; PSC posterior subcapsular cataract; ERM epi-retinal membrane; PVD posterior vitreous detachment; RD retinal detachment; DM diabetes mellitus; DR diabetic retinopathy; NPDR non-proliferative diabetic retinopathy; PDR proliferative diabetic retinopathy; CSME clinically significant macular edema; DME diabetic macular edema; dbh dot blot hemorrhages; CWS cotton wool spot; POAG primary open angle glaucoma; C/D cup-to-disc ratio; HVF humphrey visual field; GVF goldmann visual field; OCT optical coherence tomography; IOP intraocular pressure; BRVO Branch retinal vein occlusion; CRVO central retinal vein occlusion; CRAO central retinal artery occlusion; BRAO branch retinal artery occlusion; RT retinal tear; SB scleral buckle; PPV pars plana vitrectomy; VH Vitreous hemorrhage; PRP  panretinal laser photocoagulation; IVK intravitreal kenalog; VMT vitreomacular traction; MH Macular hole;  NVD neovascularization of the disc; NVE neovascularization elsewhere; AREDS age related eye disease study; ARMD age related macular degeneration; POAG primary open angle glaucoma; EBMD epithelial/anterior basement membrane dystrophy; ACIOL anterior chamber intraocular lens; IOL intraocular lens; PCIOL posterior chamber intraocular lens; Phaco/IOL phacoemulsification with intraocular lens placement; PRK photorefractive keratectomy; LASIK laser assisted in situ keratomileusis; HTN hypertension; DM diabetes mellitus; COPD chronic obstructive pulmonary disease

## 2023-06-01 DIAGNOSIS — N1832 Chronic kidney disease, stage 3b: Secondary | ICD-10-CM | POA: Diagnosis not present

## 2023-06-01 DIAGNOSIS — I129 Hypertensive chronic kidney disease with stage 1 through stage 4 chronic kidney disease, or unspecified chronic kidney disease: Secondary | ICD-10-CM | POA: Diagnosis not present

## 2023-06-01 DIAGNOSIS — I5032 Chronic diastolic (congestive) heart failure: Secondary | ICD-10-CM | POA: Diagnosis not present

## 2023-06-01 DIAGNOSIS — E871 Hypo-osmolality and hyponatremia: Secondary | ICD-10-CM | POA: Diagnosis not present

## 2023-06-02 ENCOUNTER — Encounter (INDEPENDENT_AMBULATORY_CARE_PROVIDER_SITE_OTHER): Payer: Medicare HMO | Admitting: Ophthalmology

## 2023-06-05 ENCOUNTER — Encounter (INDEPENDENT_AMBULATORY_CARE_PROVIDER_SITE_OTHER): Payer: Self-pay | Admitting: Ophthalmology

## 2023-06-05 ENCOUNTER — Ambulatory Visit (INDEPENDENT_AMBULATORY_CARE_PROVIDER_SITE_OTHER): Payer: Medicare HMO | Admitting: Ophthalmology

## 2023-06-05 DIAGNOSIS — H35033 Hypertensive retinopathy, bilateral: Secondary | ICD-10-CM

## 2023-06-05 DIAGNOSIS — I10 Essential (primary) hypertension: Secondary | ICD-10-CM | POA: Diagnosis not present

## 2023-06-05 DIAGNOSIS — H353231 Exudative age-related macular degeneration, bilateral, with active choroidal neovascularization: Secondary | ICD-10-CM

## 2023-06-05 DIAGNOSIS — Z961 Presence of intraocular lens: Secondary | ICD-10-CM

## 2023-06-05 DIAGNOSIS — H43813 Vitreous degeneration, bilateral: Secondary | ICD-10-CM | POA: Diagnosis not present

## 2023-06-05 DIAGNOSIS — H04123 Dry eye syndrome of bilateral lacrimal glands: Secondary | ICD-10-CM

## 2023-06-05 DIAGNOSIS — H40053 Ocular hypertension, bilateral: Secondary | ICD-10-CM

## 2023-06-05 MED ORDER — FARICIMAB-SVOA 6 MG/0.05ML IZ SOLN
6.0000 mg | INTRAVITREAL | Status: AC | PRN
Start: 2023-06-05 — End: 2023-06-05
  Administered 2023-06-05: 6 mg via INTRAVITREAL

## 2023-06-12 ENCOUNTER — Ambulatory Visit (HOSPITAL_COMMUNITY)
Admission: RE | Admit: 2023-06-12 | Discharge: 2023-06-12 | Disposition: A | Discharge status: Home / Self Care | Payer: Medicare HMO | Source: Ambulatory Visit | Attending: Hematology | Admitting: Hematology

## 2023-06-12 ENCOUNTER — Encounter (HOSPITAL_COMMUNITY): Payer: Self-pay | Admitting: Radiology

## 2023-06-12 DIAGNOSIS — C189 Malignant neoplasm of colon, unspecified: Secondary | ICD-10-CM | POA: Insufficient documentation

## 2023-06-12 DIAGNOSIS — N281 Cyst of kidney, acquired: Secondary | ICD-10-CM | POA: Diagnosis not present

## 2023-06-12 DIAGNOSIS — I700 Atherosclerosis of aorta: Secondary | ICD-10-CM | POA: Diagnosis not present

## 2023-06-12 MED ORDER — IOHEXOL 9 MG/ML PO SOLN
ORAL | Status: AC
  Filled 2023-06-12: qty 500

## 2023-06-12 MED ORDER — IOHEXOL 300 MG/ML  SOLN
80.0000 mL | Freq: Once | INTRAMUSCULAR | Status: AC | PRN
  Administered 2023-06-12: 80 mL via INTRAVENOUS

## 2023-06-13 ENCOUNTER — Other Ambulatory Visit: Payer: Self-pay | Admitting: Family Medicine

## 2023-06-13 DIAGNOSIS — J069 Acute upper respiratory infection, unspecified: Secondary | ICD-10-CM

## 2023-06-13 NOTE — Progress Notes (Signed)
[facility] [address], [place] [address]    Clinic Day:  06/13/2023  Referring physician: [name], DO  Patient Care Team: [name], DO as PCP - General (Family Medicine) [name], MD as Consulting Physician (Retina Ophthalmology) Fabio Pierce, MD as Consulting Physician (Ophthalmology) West Bali, MD (Inactive) as Consulting Physician (Gastroenterology) Doreatha Massed, MD as Medical Oncologist (Oncology)   ASSESSMENT & PLAN:   Assessment: 1.  Stage IIIb (T4AN1A) poorly differentiated right colon adenocarcinoma: -Right hemicolectomy on 04/07/2020 with poorly differentiated adenocarcinoma, pT4a, positive radial margin, 1/15 lymph nodes positive, loss of MLH1 and PMS2, bladder biopsy negative for malignancy. -CTAP on 04/06/2020 showed circumferential ascending colon mass with numerous borderline enlarged pericolonic lymph nodes.  No findings of hepatic metastatic disease. -CEA on 04/04/2020 was 12.2.  CEA improved to 1.7 on 05/13/2020. -PET scan on 05/11/2020 shows mild FDG uptake associated with the anastomotic site.  Small pulmonary nodules that do not show elevated FDG activity, remain nonspecific, subcentimeter.  Persistent but improved bladder wall thickening. -Cycle 1 of dose reduced FOLFOX on 05/19/2020, followed by 2 hospitalizations, 1 from acute kidney injury from diarrhea and second admission for C. difficile colitis. -Cycle 2 of 5-FU and leucovorin dose reduced on 06/30/2020.  Chemotherapy discontinued secondary to intolerance. -Follow-up CT scan on 11/02/2020 with multiple soft tissue lesions in the central and right mesentery measuring up to 3.1 x 1.7 cm.  Mild lymphadenopathy in the hepatic duodenal ligament, retroperitoneal space, right common iliac chain, bilateral external iliac chains.  17 mm soft tissue nodule in the lower anterior abdominal wall.  Bilateral tiny pulmonary nodules not substantially changed.  2.9 x 2.5 cm  low-density lesion along the posterior uterus is indeterminate. -Pembrolizumab started on 11/10/2020. -CT CAP from 02/09/2021 showed interval near complete resolution of previously demonstrated nodal mass in the ileocolonic mesentery.  Residual ill-defined nodule medial to the tip of the right hepatic lobe.  Stable small pulmonary nodules bilaterally consistent with benign findings.   2.  Family history: -Paternal uncle had colon cancer.  Maternal aunt had brain cancer and another maternal aunt had gynecological malignancy.  Father had lung cancer and was a smoker. - Genetic testing did not reveal any notable mutations.   3.  Diffuse erythema and nodularity of the dome of the bladder: -CT scan showed severely thickened bladder wall. -Cystoscopy on 04/08/2020 showed diffuse erythema, nodularity in the posterior wall tracking to the dome.  Ureteral orifices were in normal locations.  Biopsies were benign.    Plan: 1.  Stage IV right colon adenocarcinoma, MSI-high: - CT CAP on 01/11/2023: Bilateral lung nodules favor benign.  No evidence of metastatic disease. - She does not report any diarrhea.  She has a dry cough for the last 1 week with nasal discharge.  Most likely allergies.  If it gets worse, she will let us know.  No skin rashes. - Labs today: Normal LFTs.  Creatinine 1.48 stable.  CBC was grossly normal.  TSH is 2.02.  Last CEA was 2.4. - She was reportedly treated for UTI and completed antibiotics last Friday. - She will proceed with treatment today and every 3 weeks.  RTC 9 weeks.  Will repeat CT CAP with contrast and CEA.   2.  Paroxysmal atrial fibrillation: - Continue Eliquis indefinitely.  No bleeding issues.   3.  Hypertension: - Continue Hyzaar and amlodipine.  Blood pressure today is 160/68.  4.  Hypokalemia: - Continue potassium 20 mEq  3 times daily.  Potassium is 4.9.    No orders of the defined types were placed in this encounter.      Alben Deeds Teague,acting as a  Neurosurgeon for Doreatha Massed, MD.,have documented all relevant documentation on the behalf of Doreatha Massed, MD,as directed by  Doreatha Massed, MD while in the presence of Doreatha Massed, MD.  ***   Blasdell R Teague   7/9/20249:21 PM  CHIEF COMPLAINT:   Diagnosis: right colon cancer    Cancer Staging  Malignant neoplasm of colon [facility]) Staging form: Colon and Rectum, AJCC 8th Edition - Clinical stage from 04/27/2020: Stage IIIB (cT4a, cN1a, cM0) - Unsigned - Pathologic stage from 11/05/2020: Stage IVC (rpTX, pN0, pM1c) - Signed by Doreatha Massed, MD on 11/05/2020    Prior Therapy: Rande Lawman every 3 weeks   Current Therapy:  COLORECTAL Pembrolizumab    HISTORY OF PRESENT ILLNESS:   Oncology History  Malignant neoplasm of colon (HCC)  04/07/2020 Initial Diagnosis   Malignant neoplasm of ascending colon (HCC)   05/13/2020 Genetic Testing   [facility]     05/19/2020 - 07/02/2020 Chemotherapy   The patient had palonosetron (ALOXI) injection 0.25 mg, 0.25 mg, Intravenous,  Once, 2 of 12 cycles Administration: 0.25 mg (05/19/2020), 0.25 mg (06/30/2020) leucovorin 522 mg in dextrose 5 % 250 mL infusion, 320 mg/m2 = 522 mg (80 % of original dose 400 mg/m2), Intravenous,  Once, 2 of 12 cycles Dose modification: 320 mg/m2 (80 % of original dose 400 mg/m2, Cycle 1, Reason: Provider Judgment), 829.5621 mg/m2 (66.7 % of original dose 400 mg/m2, Cycle 2, Reason: Provider Judgment) Administration: 522 mg (05/19/2020), 434 mg (06/30/2020) oxaliplatin (ELOXATIN) 110 mg in dextrose 5 % 500 mL chemo infusion, 68 mg/m2 = 110 mg (80 % of original dose 85 mg/m2), Intravenous,  Once, 1 of 1 cycle Dose modification: 68 mg/m2 (80 % of original dose 85 mg/m2, Cycle 1, Reason: Provider Judgment) Administration: 110 mg (05/19/2020) fluorouracil (ADRUCIL) chemo injection 500 mg, 320 mg/m2 = 500 mg (80 % of original dose 400 mg/m2), Intravenous,  Once, 2 of 12 cycles Dose modification: 320  mg/m2 (80 % of original dose 400 mg/m2, Cycle 1, Reason: Provider Judgment), 308.6578 mg/m2 (66.7 % of original dose 400 mg/m2, Cycle 2, Reason: Provider Judgment) Administration: 500 mg (05/19/2020), 450 mg (06/30/2020) fluorouracil (ADRUCIL) 3,150 mg in sodium chloride 0.9 % 87 mL chemo infusion, 1,920 mg/m2 = 3,150 mg (80 % of original dose 2,400 mg/m2), Intravenous, 1 Day/Dose, 2 of 12 cycles Dose modification: 1,920 mg/m2 (80 % of original dose 2,400 mg/m2, Cycle 1, Reason: Provider Judgment), 1,600 mg/m2 (66.7 % of original dose 2,400 mg/m2, Cycle 2, Reason: Provider Judgment) Administration: 3,150 mg (05/19/2020), 2,600 mg (06/30/2020)  for chemotherapy treatment.    08/03/2020 Genetic Testing   [facility] Reveal Testing     08/14/2020 Genetic Testing   No pathogenic variants detected in [facility] Common Hereditary Cancers Panel.  Variant of uncertain significance (VUS) detected in HOXB13 at c.634G>A (p.Ala212Thr). The Common Hereditary Cancers Panel offered by [facility] includes sequencing and/or deletion duplication testing of the following 48 genes: APC, ATM, AXIN2, BARD1, BMPR1A, BRCA1, BRCA2, BRIP1, CDH1, CDK4, CDKN2A (p14ARF), CDKN2A (p16INK4a), CHEK2, CTNNA1, DICER1, EPCAM (Deletion/duplication testing only), FLCN, GREM1 (promoter region deletion/duplication testing only), KIT, MEN1, MLH1, MSH2, MSH3, MSH6, MUTYH, NBN, NF1, NHTL1, PALB2, PDGFRA, PMS2, POLD1, POLE, PTEN, RAD50, RAD51C, RAD51D, RNF43, SDHB, SDHC, SDHD, SMAD4, SMARCA4. STK11, TP53, TSC1, TSC2, and VHL.  The following genes were evaluated for sequence  changes only: SDHA and HOXB13 c.251G>A variant only. The report date is August 14, 2020.    11/05/2020 Cancer Staging   Staging form: Colon and Rectum, AJCC 8th Edition - Pathologic stage from 11/05/2020: Stage IVC (rpTX, pN0, pM1c) - Signed by Doreatha Massed, MD on 11/05/2020   11/10/2020 - 07/13/2022 Chemotherapy   Patient is on Treatment Plan : COLORECTAL Pembrolizumab q21d      11/10/2020 -  Chemotherapy   Patient is on Treatment Plan : COLORECTAL Pembrolizumab (200) q21d        INTERVAL HISTORY:   Gabrielle [name] is a 77 y.o. female presenting to clinic today for follow up of right colon cancer. She was last seen by me on 04/12/23.  Since her last visit, she had a CT of chest, abdomen, and pelvis w contrast on 7/8 with results pending.   Today, she states that she is doing well overall. Her appetite level is at ***%. Her energy level is at ***%.  PAST MEDICAL HISTORY:   Past Medical History: Past Medical History:  Diagnosis Date   Arthritis    Cancer [facility])    Cataract    OU   Colon cancer (HCC)    Family history of pancreatic cancer 08/21/2020   Family history of uterine cancer 08/21/2020   H/O cesarean section    Hx of tonsillectomy    Hyperlipidemia    Hypertension    Macular degeneration    Exu ARMD OU   Paroxysmal atrial fibrillation (HCC) 07/05/2020    Surgical History: Past Surgical History:  Procedure Laterality Date   BIOPSY  04/06/2020   Procedure: BIOPSY;  Surgeon: Malissa Hippo, MD;  [facility];  Service: Endoscopy;;   CARPAL TUNNEL RELEASE Right    CATARACT EXTRACTION W/PHACO Left 10/11/2019   Procedure: CATARACT EXTRACTION PHACO AND INTRAOCULAR LENS PLACEMENT (IOC);  Surgeon: Fabio Pierce, MD;  Location: [facility];  Service: Ophthalmology;  Laterality: Left;  CDE: 8.56   CATARACT EXTRACTION W/PHACO Right 10/25/2019   Procedure: CATARACT EXTRACTION PHACO AND INTRAOCULAR LENS PLACEMENT (IOC);  Surgeon: Fabio Pierce, MD;  Location: [facility];  Service: Ophthalmology;  Laterality: Right;  CDE: 5.67   CESAREAN SECTION     COLONOSCOPY N/A 04/06/2020   Procedure: COLONOSCOPY;  Surgeon: Malissa Hippo, MD;  Location: [facility];  Service: Endoscopy;  Laterality: N/A;   CYSTOSCOPY WITH BIOPSY N/A 04/08/2020   Procedure: CYSTOSCOPY WITH BIOPSY;  Surgeon: Malen Gauze, MD;  Location: [facility];  Service: Urology;  Laterality: N/A;    ESOPHAGOGASTRODUODENOSCOPY N/A 04/05/2020   Procedure: ESOPHAGOGASTRODUODENOSCOPY (EGD);  Surgeon: Malissa Hippo, MD;  [facility]: [facility];  Service: Endoscopy;  Laterality: N/A;   PARTIAL COLECTOMY N/A 04/08/2020   Procedure: PARTIAL COLECTOMY;  Surgeon: Lucretia Roers, MD;  Location: [facility];  Service: General;  Laterality: N/A;   PORTACATH PLACEMENT Left 05/18/2020   Procedure: INSERTION PORT-A-CATH (ATTACHED CATHETER IN LEFT SUBCLAVIAN);  Surgeon: Lucretia Roers, MD;  Location: [facility];  Service: General;  Laterality: Left;   TONSILLECTOMY      Social History: Social History   Socioeconomic History   Marital status: Divorced    Spouse name: Not on file   Number of children: Not on file   Years of education: Not on file   Highest education level: Not on file  Occupational History   Not on file  Tobacco Use   Smoking status: Never   Smokeless tobacco: Never  Vaping Use   Vaping Use:  Never used  Substance and Sexual Activity   Alcohol use: No   Drug use: No   Sexual activity: Not Currently  Other Topics Concern   Not on file  Social History Narrative   Not on file   Social Determinants of Health   Financial Resource Strain: Low Risk  (03/15/2023)   Overall Financial Resource Strain (CARDIA)    Difficulty of Paying Living Expenses: Not hard at all  Food Insecurity: No Food Insecurity (03/15/2023)   Hunger Vital Sign    Worried About Running Out of Food in the Last Year: Never true    Ran Out of Food in the Last Year: Never true  Transportation Needs: No Transportation Needs (03/15/2023)   PRAPARE - Administrator, Civil Service (Medical): No    Lack of Transportation (Non-Medical): No  Physical Activity: Sufficiently Active (03/15/2023)   Exercise Vital Sign    Days of Exercise per Week: 5 days    Minutes of Exercise per Session: 30 min  Stress: No Stress Concern Present (03/15/2023)   [employer] of Occupational Health - Occupational Stress  Questionnaire    Feeling of Stress : Not at all  Social Connections: Moderately Integrated (03/15/2023)   Social Connection and Isolation Panel [NHANES]    Frequency of Communication with Friends and Family: More than three times a week    Frequency of Social Gatherings with Friends and Family: More than three times a week    Attends Religious Services: More than 4 times per year    Active Member of [employer] or Organizations: Yes    Attends Banker Meetings: More than 4 times per year    Marital Status: Widowed  Intimate Partner Violence: Not At Risk (03/15/2023)   Humiliation, Afraid, Rape, and Kick questionnaire    Fear of Current or Ex-Partner: No    Emotionally Abused: No    Physically Abused: No    Sexually Abused: No    Family History: Family History  Problem Relation Age of Onset   Macular degeneration Mother    Stroke Mother    Hypertension Mother    Dementia Mother    Lung cancer Father        dx late 63s; smoking hx   Diabetes Sister    Hypertension Sister    Leukemia Paternal Uncle        d. 58s   Cancer Maternal Aunt        ovarian or endometrial dx 74s   Pancreatic cancer Paternal Uncle        d. late 61s    Current Medications:  Current Outpatient Medications:    alendronate (FOSAMAX) 70 MG tablet, TAKE ONE TABLET EVERY 7 DAYS ON AN EMPTY STOMACH WITH A FULL GLASS OF WATER, Disp: 12 tablet, Rfl: 3   amLODipine (NORVASC) 10 MG tablet, Take 1 tablet (10 mg total) by mouth daily. For blood pressure, Disp: 90 tablet, Rfl: 3   atorvastatin (LIPITOR) 20 MG tablet, Take 1 tablet (20 mg total) by mouth daily. For cholesterol, Disp: 90 tablet, Rfl: 3   benzonatate (TESSALON) 100 MG capsule, TAKE ONE CAPSULE TWICE DAILY AS NEEDED FOR COUGH, Disp: 20 capsule, Rfl: 0   cholecalciferol (VITAMIN D3) 25 MCG (1000 UT) tablet, Take 1,000 Units by mouth daily., Disp: , Rfl:    CRANBERRY FRUIT PO, Take 1 tablet by mouth daily., Disp: , Rfl:    diclofenac Sodium  (VOLTAREN) 1 % GEL, APPLY 4 GRAMS TO AFFECTED AREA(S) 4  TIMES A DAY, Disp: 400 g, Rfl: 2   dorzolamide-timolol (COSOPT) 2-0.5 % ophthalmic solution, INSTILL ONE DROP IN EACH EYE TWICE DAILY, Disp: 10 mL, Rfl: 10   dorzolamide-timolol (COSOPT) 2-0.5 % ophthalmic solution, Place 1 drop into the right eye 2 (two) times daily., Disp: 3 mL, Rfl: 11   ELIQUIS 5 MG TABS tablet, Take 1 tablet (5 mg total) by mouth 2 (two) times daily., Disp: 180 tablet, Rfl: 3   fluticasone (FLONASE) 50 MCG/ACT nasal spray, Place 2 sprays into both nostrils daily., Disp: 16 g, Rfl: 6   hydrALAZINE (APRESOLINE) 25 MG tablet, Take 1 tablet (25 mg total) by mouth 2 (two) times daily., Disp: 180 tablet, Rfl: 3   losartan-hydrochlorothiazide (HYZAAR) 100-25 MG tablet, Take 1 tablet by mouth daily., Disp: 90 tablet, Rfl: 3   potassium chloride SA (KLOR-CON M) 20 MEQ tablet, Take 1 tablet (20 mEq total) by mouth 3 (three) times daily., Disp: 90 tablet, Rfl: 3   spironolactone (ALDACTONE) 25 MG tablet, Take 1 tablet (25 mg total) by mouth daily., Disp: 90 tablet, Rfl: 3   Allergies: Allergies  Allergen Reactions   Lisinopril Cough    REVIEW OF SYSTEMS:   Review of Systems  Constitutional:  Negative for chills, fatigue and fever.  HENT:   Negative for lump/mass, mouth sores, nosebleeds, sore throat and trouble swallowing.   Eyes:  Negative for eye problems.  Respiratory:  Negative for cough and shortness of breath.   Cardiovascular:  Negative for chest pain, leg swelling and palpitations.  Gastrointestinal:  Negative for abdominal pain, constipation, diarrhea, nausea and vomiting.  Genitourinary:  Negative for bladder incontinence, difficulty urinating, dysuria, frequency, hematuria and nocturia.   Musculoskeletal:  Negative for arthralgias, back pain, flank pain, myalgias and neck pain.  Skin:  Negative for itching and rash.  Neurological:  Negative for dizziness, headaches and numbness.  Hematological:  Does not  bruise/bleed easily.  Psychiatric/Behavioral:  Negative for depression, sleep disturbance and suicidal ideas. The patient is not nervous/anxious.   All other systems reviewed and are negative.    VITALS:   There were no vitals taken for this visit.  Wt Readings from Last 3 Encounters:  05/25/23 181 lb 3.2 oz (82.2 kg)  05/03/23 179 lb 14.3 oz (81.6 kg)  04/24/23 179 lb 3.2 oz (81.3 kg)    There is no height or weight on file to calculate BMI.  Performance status (ECOG): 1 - Symptomatic but completely ambulatory  PHYSICAL EXAM:   Physical Exam Vitals and nursing note reviewed. Exam conducted with a chaperone present.  Constitutional:      Appearance: Normal appearance.  Cardiovascular:     Rate and Rhythm: Normal rate and regular rhythm.     Pulses: Normal pulses.     Heart sounds: Normal heart sounds.  Pulmonary:     Effort: Pulmonary effort is normal.     Breath sounds: Normal breath sounds.  Abdominal:     Palpations: Abdomen is soft. There is no hepatomegaly, splenomegaly or mass.     Tenderness: There is no abdominal tenderness.  Musculoskeletal:     Right lower leg: No edema.     Left lower leg: No edema.  Lymphadenopathy:     Cervical: No cervical adenopathy.     Right cervical: No superficial, deep or posterior cervical adenopathy.    Left cervical: No superficial, deep or posterior cervical adenopathy.     Upper Body:     Right upper body: No supraclavicular or axillary  adenopathy.     Left upper body: No supraclavicular or axillary adenopathy.  Neurological:     General: No focal deficit present.     Mental Status: She is alert and oriented to person, place, and time.  Psychiatric:        Mood and Affect: Mood normal.        Behavior: Behavior normal.     LABS:      Latest Ref Rng & Units 05/25/2023   12:13 PM 05/03/2023   12:17 PM 04/12/2023   12:28 PM  CBC  WBC 4.0 - 10.5 K/uL 5.6  6.7  5.7   Hemoglobin 12.0 - 15.0 g/dL 16.1  09.6  04.5    Hematocrit 36.0 - 46.0 % 36.5  36.8  35.2   Platelets 150 - 400 K/uL 175  161  154       Latest Ref Rng & Units 05/25/2023   12:13 PM 05/03/2023   12:17 PM 04/12/2023   12:28 PM  CMP  Glucose 70 - 99 mg/dL 409  811  90   BUN 8 - 23 mg/dL 24  21  29    Creatinine 0.44 - 1.00 mg/dL 9.14  7.82  9.56   Sodium 135 - 145 mmol/L 127  128  129   Potassium 3.5 - 5.1 mmol/L 4.4  4.6  4.9   Chloride 98 - 111 mmol/L 93  93  98   CO2 22 - 32 mmol/L 25  23  23    Calcium 8.9 - 10.3 mg/dL 9.9  21.3  9.8   Total Protein 6.5 - 8.1 g/dL 7.9  7.4  7.4   Total Bilirubin 0.3 - 1.2 mg/dL 0.5  0.5  0.5   Alkaline Phos 38 - 126 U/L 71  70  69   AST 15 - 41 U/L 17  19  20    ALT 0 - 44 U/L 16  15  15       Lab Results  Component Value Date   CEA1 2.4 03/01/2023   /  CEA  Date Value Ref Range Status  03/01/2023 2.4 0.0 - 4.7 ng/mL Final    Comment:    (NOTE)                             Nonsmokers          <3.9                             Smokers             <5.6 Roche [employer] Electrochemiluminescence Immunoassay (ECLIA) Values obtained with different assay methods or kits cannot be used interchangeably.  Results cannot be interpreted as absolute evidence of the presence or absence of malignant disease. Performed At: [facility] [address] [place], [place] [id number] Jolene Schimke MD [id number]    No results found for: "PSA1" No results found for: "CAN199" No results found for: "CAN125"  No results found for: "TOTALPROTELP", "ALBUMINELP", "A1GS", "A2GS", "BETS", "BETA2SER", "GAMS", "MSPIKE", "SPEI" Lab Results  Component Value Date   TIBC 371 03/01/2023   TIBC 396 05/13/2020   TIBC 395 05/13/2020   FERRITIN 52 03/01/2023   FERRITIN 65 05/13/2020   FERRITIN 64 05/13/2020   IRONPCTSAT 18 03/01/2023   IRONPCTSAT 23 05/13/2020   IRONPCTSAT 23 05/13/2020   No results found for: "LDH"   STUDIES:   Intravitreal Injection, Pharmacologic  Agent - OS - Left Eye  Result  Date: 06/05/2023 Time Out 06/05/2023. 8:24 AM. Confirmed correct patient, procedure, site, and patient consented. Anesthesia Topical anesthesia was used. Anesthetic medications included Lidocaine 2%, Proparacaine 0.5%. Procedure Preparation included 5% betadine to ocular surface, eyelid speculum. A (32g) needle was used. Injection: 6 mg faricimab-svoa 6 MG/0.05ML   Route: Intravitreal, Site: Left Eye   NDC: 82956-213-08, Lot: [id number], Expiration date: 04/03/2025, Waste: 0 mL Post-op Post injection exam found visual acuity of at least counting fingers. The patient tolerated the procedure well. There were no complications. The patient received written and verbal post procedure care education. Post injection medications were not given.   OCT, Retina - OU - Both Eyes  Result Date: 06/05/2023 Right Eye Quality was good. Central Foveal Thickness: 481. Progression has been stable. Findings include no SRF, abnormal foveal contour, retinal drusen , outer retinal tubulation, subretinal hyper-reflective material, disciform scar, epiretinal membrane, intraretinal fluid, pigment epithelial detachment, outer retinal atrophy (Stable improvement in sub-retinal scar and cystic changes overlying ). Left Eye Quality was good. Central Foveal Thickness: 269. Progression has improved. Findings include no SRF, abnormal foveal contour, retinal drusen , subretinal hyper-reflective material, intraretinal fluid, pigment epithelial detachment, outer retinal atrophy (Mild interval improvement in cystic changes nasal fovea and macula overlying PED/SRHM ). Notes *Images captured and stored on drive Diagnosis / Impression: Exudative ARMD OU OD: Stable improvement in sub-retinal scar and cystic changes overlying OS: mild interval improvement in cystic changes nasal fovea and macula overlying PED/SRHM Clinical management: See below Abbreviations: NFP - Normal foveal profile. CME - cystoid macular edema. PED - pigment epithelial detachment. IRF -  intraretinal fluid. SRF - subretinal fluid. EZ - ellipsoid zone. ERM - epiretinal membrane. ORA - outer retinal atrophy. ORT - outer retinal tubulation. SRHM - subretinal hyper-reflective material

## 2023-06-14 ENCOUNTER — Inpatient Hospital Stay: Payer: Medicare HMO

## 2023-06-14 ENCOUNTER — Inpatient Hospital Stay: Payer: Medicare HMO | Attending: Hematology

## 2023-06-14 ENCOUNTER — Inpatient Hospital Stay (HOSPITAL_BASED_OUTPATIENT_CLINIC_OR_DEPARTMENT_OTHER): Payer: Medicare HMO | Admitting: Hematology

## 2023-06-14 VITALS — BP 139/69 | HR 65 | Temp 97.7°F | Resp 18 | Ht 63.0 in | Wt 174.5 lb

## 2023-06-14 VITALS — BP 144/82 | HR 65 | Temp 97.5°F | Resp 18

## 2023-06-14 DIAGNOSIS — C189 Malignant neoplasm of colon, unspecified: Secondary | ICD-10-CM | POA: Diagnosis not present

## 2023-06-14 DIAGNOSIS — Z5112 Encounter for antineoplastic immunotherapy: Secondary | ICD-10-CM | POA: Insufficient documentation

## 2023-06-14 DIAGNOSIS — Z79899 Other long term (current) drug therapy: Secondary | ICD-10-CM

## 2023-06-14 DIAGNOSIS — E876 Hypokalemia: Secondary | ICD-10-CM | POA: Insufficient documentation

## 2023-06-14 DIAGNOSIS — Z79620 Long term (current) use of immunosuppressive biologic: Secondary | ICD-10-CM | POA: Insufficient documentation

## 2023-06-14 DIAGNOSIS — C182 Malignant neoplasm of ascending colon: Secondary | ICD-10-CM | POA: Insufficient documentation

## 2023-06-14 DIAGNOSIS — E871 Hypo-osmolality and hyponatremia: Secondary | ICD-10-CM

## 2023-06-14 LAB — CBC WITH DIFFERENTIAL/PLATELET
Abs Immature Granulocytes: 0.03 10*3/uL (ref 0.00–0.07)
Basophils Absolute: 0 10*3/uL (ref 0.0–0.1)
Basophils Relative: 0 %
Eosinophils Absolute: 0.3 10*3/uL (ref 0.0–0.5)
Eosinophils Relative: 4 %
HCT: 34.9 % — ABNORMAL LOW (ref 36.0–46.0)
Hemoglobin: 11.6 g/dL — ABNORMAL LOW (ref 12.0–15.0)
Immature Granulocytes: 0 %
Lymphocytes Relative: 18 %
Lymphs Abs: 1.3 10*3/uL (ref 0.7–4.0)
MCH: 30.7 pg (ref 26.0–34.0)
MCHC: 33.2 g/dL (ref 30.0–36.0)
MCV: 92.3 fL (ref 80.0–100.0)
Monocytes Absolute: 0.8 10*3/uL (ref 0.1–1.0)
Monocytes Relative: 11 %
Neutro Abs: 4.9 10*3/uL (ref 1.7–7.7)
Neutrophils Relative %: 67 %
Platelets: 181 10*3/uL (ref 150–400)
RBC: 3.78 MIL/uL — ABNORMAL LOW (ref 3.87–5.11)
RDW: 12.8 % (ref 11.5–15.5)
WBC: 7.4 10*3/uL (ref 4.0–10.5)
nRBC: 0 % (ref 0.0–0.2)

## 2023-06-14 LAB — COMPREHENSIVE METABOLIC PANEL
ALT: 14 U/L (ref 0–44)
AST: 18 U/L (ref 15–41)
Albumin: 4 g/dL (ref 3.5–5.0)
Alkaline Phosphatase: 71 U/L (ref 38–126)
Anion gap: 8 (ref 5–15)
BUN: 23 mg/dL (ref 8–23)
CO2: 23 mmol/L (ref 22–32)
Calcium: 9.6 mg/dL (ref 8.9–10.3)
Chloride: 94 mmol/L — ABNORMAL LOW (ref 98–111)
Creatinine, Ser: 1.38 mg/dL — ABNORMAL HIGH (ref 0.44–1.00)
GFR, Estimated: 40 mL/min — ABNORMAL LOW (ref >60)
Glucose, Bld: 73 mg/dL (ref 70–99)
Potassium: 5.2 mmol/L — ABNORMAL HIGH (ref 3.5–5.1)
Sodium: 125 mmol/L — ABNORMAL LOW (ref 135–145)
Total Bilirubin: 0.5 mg/dL (ref 0.3–1.2)
Total Protein: 7.5 g/dL (ref 6.5–8.1)

## 2023-06-14 LAB — TSH: TSH: 2.008 u[IU]/mL (ref 0.350–4.500)

## 2023-06-14 LAB — MAGNESIUM: Magnesium: 1.9 mg/dL (ref 1.7–2.4)

## 2023-06-14 MED ORDER — SODIUM CHLORIDE 0.9% FLUSH
10.0000 mL | INTRAVENOUS | Status: DC | PRN
  Administered 2023-06-14: 10 mL

## 2023-06-14 MED ORDER — SODIUM CHLORIDE 0.9 % IV SOLN: Freq: Once | INTRAVENOUS | Status: AC

## 2023-06-14 MED ORDER — HEPARIN SOD (PORK) LOCK FLUSH 100 UNIT/ML IV SOLN
500.0000 [IU] | Freq: Once | INTRAVENOUS | Status: AC | PRN
  Administered 2023-06-14: 500 [IU]

## 2023-06-14 MED ORDER — SODIUM CHLORIDE 0.9 % IV SOLN
200.0000 mg | Freq: Once | INTRAVENOUS | Status: AC
  Administered 2023-06-14: 200 mg via INTRAVENOUS
  Filled 2023-06-14: qty 8

## 2023-06-14 NOTE — Patient Instructions (Signed)
[facility]  Discharge Instructions: Thank you for choosing [facility] to provide your oncology and hematology care.  If you have a lab appointment with the Cancer Center - please note that after April 8th, 2024, all labs will be drawn in the cancer center.  You do not have to check in or register with the main entrance as you have in the past but will complete your check-in in the cancer center.  Wear comfortable clothing and clothing appropriate for easy access to any Portacath or PICC line.   We strive to give you quality time with your provider. You may need to reschedule your appointment if you arrive late (15 or more minutes).  Arriving late affects you and other patients whose appointments are after yours.  Also, if you miss three or more appointments without notifying the office, you may be dismissed from the clinic at the provider's discretion.      For prescription refill requests, have your pharmacy contact our office and allow 72 hours for refills to be completed.    Today you received the following chemotherapy and/or immunotherapy agents Keytruda. Pembrolizumab Injection What is this medication? PEMBROLIZUMAB (PEM broe LIZ ue mab) treats some types of cancer. It works by helping your immune system slow or stop the spread of cancer cells. It is a monoclonal antibody. This medicine may be used for other purposes; ask your health care provider or pharmacist if you have questions. COMMON BRAND NAME(S): Keytruda What should I tell my care team before I take this medication? They need to know if you have any of these conditions: Allogeneic stem cell transplant (uses someone else's stem cells) Autoimmune diseases, such as Crohn disease, ulcerative colitis, lupus History of chest radiation Nervous system problems, such as Guillain-Barre syndrome, myasthenia gravis Organ transplant An unusual or allergic reaction to pembrolizumab, other medications,  foods, dyes, or preservatives Pregnant or trying to get pregnant Breast-feeding How should I use this medication? This medication is injected into a vein. It is given by your care team in a hospital or clinic setting. A special MedGuide will be given to you before each treatment. Be sure to read this information carefully each time. Talk to your care team about the use of this medication in children. While it may be prescribed for children as young as 6 months for selected conditions, precautions do apply. Overdosage: If you think you have taken too much of this medicine contact a poison control center or emergency room at once. NOTE: This medicine is only for you. Do not share this medicine with others. What if I miss a dose? Keep appointments for follow-up doses. It is important not to miss your dose. Call your care team if you are unable to keep an appointment. What may interact with this medication? Interactions have not been studied. This list may not describe all possible interactions. Give your health care provider a list of all the medicines, herbs, non-prescription drugs, or dietary supplements you use. Also tell them if you smoke, drink alcohol, or use illegal drugs. Some items may interact with your medicine. What should I watch for while using this medication? Your condition will be monitored carefully while you are receiving this medication. You may need blood work while taking this medication. This medication may cause serious skin reactions. They can happen weeks to months after starting the medication. Contact your care team right away if you notice fevers or flu-like symptoms with a rash. The rash   may be red or purple and then turn into blisters or peeling of the skin. You may also notice a red rash with swelling of the face, lips, or lymph nodes in your neck or under your arms. Tell your care team right away if you have any change in your eyesight. Talk to your care team if you  may be pregnant. Serious birth defects can occur if you take this medication during pregnancy and for 4 months after the last dose. You will need a negative pregnancy test before starting this medication. Contraception is recommended while taking this medication and for 4 months after the last dose. Your care team can help you find the option that works for you. Do not breastfeed while taking this medication and for 4 months after the last dose. What side effects may I notice from receiving this medication? Side effects that you should report to your care team as soon as possible: Allergic reactions--skin rash, itching, hives, swelling of the face, lips, tongue, or throat Dry cough, shortness of breath or trouble breathing Eye pain, redness, irritation, or discharge with blurry or decreased vision Heart muscle inflammation--unusual weakness or fatigue, shortness of breath, chest pain, fast or irregular heartbeat, dizziness, swelling of the ankles, feet, or hands Hormone gland problems--headache, sensitivity to light, unusual weakness or fatigue, dizziness, fast or irregular heartbeat, increased sensitivity to cold or heat, excessive sweating, constipation, hair loss, increased thirst or amount of urine, tremors or shaking, irritability Infusion reactions--chest pain, shortness of breath or trouble breathing, feeling faint or lightheaded Kidney injury (glomerulonephritis)--decrease in the amount of urine, red or dark Boomer Winders urine, foamy or bubbly urine, swelling of the ankles, hands, or feet Liver injury--right upper belly pain, loss of appetite, nausea, light-colored stool, dark yellow or Allene Furuya urine, yellowing skin or eyes, unusual weakness or fatigue Pain, tingling, or numbness in the hands or feet, muscle weakness, change in vision, confusion or trouble speaking, loss of balance or coordination, trouble walking, seizures Rash, fever, and swollen lymph nodes Redness, blistering, peeling, or loosening  of the skin, including inside the mouth Sudden or severe stomach pain, bloody diarrhea, fever, nausea, vomiting Side effects that usually do not require medical attention (report to your care team if they continue or are bothersome): Bone, joint, or muscle pain Diarrhea Fatigue Loss of appetite Nausea Skin rash This list may not describe all possible side effects. Call your doctor for medical advice about side effects. You may report side effects to FDA at 1-800-FDA-1088. Where should I keep my medication? This medication is given in a hospital or clinic. It will not be stored at home. NOTE: This sheet is a summary. It may not cover all possible information. If you have questions about this medicine, talk to your doctor, pharmacist, or health care provider.  2024 Elsevier/Gold Standard (2022-04-05 00:00:00)       To help prevent nausea and vomiting after your treatment, we encourage you to take your nausea medication as directed.  BELOW ARE SYMPTOMS THAT SHOULD BE REPORTED IMMEDIATELY: *FEVER GREATER THAN 100.4 F (38 C) OR HIGHER *CHILLS OR SWEATING *NAUSEA AND VOMITING THAT IS NOT CONTROLLED WITH YOUR NAUSEA MEDICATION *UNUSUAL SHORTNESS OF BREATH *UNUSUAL BRUISING OR BLEEDING *URINARY PROBLEMS (pain or burning when urinating, or frequent urination) *BOWEL PROBLEMS (unusual diarrhea, constipation, pain near the anus) TENDERNESS IN MOUTH AND THROAT WITH OR WITHOUT PRESENCE OF ULCERS (sore throat, sores in mouth, or a toothache) UNUSUAL RASH, SWELLING OR PAIN  UNUSUAL VAGINAL DISCHARGE OR ITCHING     Items with * indicate a potential emergency and should be followed up as soon as possible or go to the Emergency Department if any problems should occur.  Please show the CHEMOTHERAPY ALERT CARD or IMMUNOTHERAPY ALERT CARD at check-in to the Emergency Department and triage nurse.  Should you have questions after your visit or need to cancel or reschedule your appointment, please contact  [facility] [phone]  and follow the prompts.  Office hours are 8:00 a.m. to 4:30 p.m. Monday - Friday. Please note that voicemails left after 4:00 p.m. may not be returned until the following business day.  We are closed weekends and major holidays. You have access to a nurse at all times for urgent questions. Please call the main number to the clinic [phone] and follow the prompts.  For any non-urgent questions, you may also contact your provider using MyChart. We now offer e-Visits for anyone 18 and older to request care online for non-urgent symptoms. For details visit [email or web address].   Also download the MyChart app! Go to the app store, search "MyChart", open the app, select [facility], and log in with your MyChart username and password.   

## 2023-06-14 NOTE — Patient Instructions (Signed)
[facility] Discharge Instructions   You were seen and examined today by Dr. [name].  He reviewed the results of your lab work which are normal/stable. Your potassium was high today. Cut back on the potassium you take at home to one pill a day.   He reviewed the results of your CT scan which were normal/stable.   We will proceed with your treatment today.   Return as scheduled.    Thank you for choosing [facility] to provide your oncology and hematology care.  To afford each patient quality time with our provider, please arrive at least 15 minutes before your scheduled appointment time.   If you have a lab appointment with the Cancer Center please come in thru the Main Entrance and check in at the main information desk.  You need to re-schedule your appointment should you arrive 10 or more minutes late.  We strive to give you quality time with our providers, and arriving late affects you and other patients whose appointments are after yours.  Also, if you no show three or more times for appointments you may be dismissed from the clinic at the providers discretion.     Again, thank you for choosing [facility].  Our hope is that these requests will decrease the amount of time that you wait before being seen by our physicians.       _____________________________________________________________  Should you have questions after your visit to [facility], please contact our office at [phone] and follow the prompts.  Our office hours are 8:00 a.m. and 4:30 p.m. Monday - Friday.  Please note that voicemails left after 4:00 p.m. may not be returned until the following business day.  We are closed weekends and major holidays.  You do have access to a nurse 24-7, just call the main number to the clinic [phone] and do not press any options, hold on the line and a nurse will answer the  phone.    For prescription refill requests, have your pharmacy contact our office and allow 72 hours.    Due to Covid, you will need to wear a mask upon entering the hospital. If you do not have a mask, a mask will be given to you at the Main Entrance upon arrival. For doctor visits, patients may have 1 support person age 77 or older with them. For treatment visits, patients can not have anyone with them due to social distancing guidelines and our immunocompromised population.

## 2023-06-14 NOTE — Progress Notes (Signed)
Patient has been examined by Dr. Katragadda. Vital signs and labs have been reviewed by MD - ANC, Creatinine, LFTs, hemoglobin, and platelets are within treatment parameters per M.D. - pt may proceed with treatment.  Primary RN and pharmacy notified.  

## 2023-06-14 NOTE — Progress Notes (Signed)
Patient presents today for chemotherapy infusion. Patient is in satisfactory condition with no new complaints voiced.  Vital signs are stable.  Labs reviewed by Dr. [name] during the office visit and all labs are within treatment parameters.  Sodium today is 125 so we will give 500 mL of NS over 30 minutes per Dr. Dionicia Abler for hyponatremia.  We will proceed with treatment per MD orders.   Patient tolerated treatment well with no complaints voiced.  Patient left ambulatory in stable condition.  Vital signs stable at discharge.  Follow up as scheduled.

## 2023-06-15 DIAGNOSIS — I5032 Chronic diastolic (congestive) heart failure: Secondary | ICD-10-CM | POA: Diagnosis not present

## 2023-06-15 DIAGNOSIS — N1832 Chronic kidney disease, stage 3b: Secondary | ICD-10-CM | POA: Diagnosis not present

## 2023-06-15 DIAGNOSIS — I129 Hypertensive chronic kidney disease with stage 1 through stage 4 chronic kidney disease, or unspecified chronic kidney disease: Secondary | ICD-10-CM | POA: Diagnosis not present

## 2023-06-15 DIAGNOSIS — N189 Chronic kidney disease, unspecified: Secondary | ICD-10-CM | POA: Diagnosis not present

## 2023-06-15 DIAGNOSIS — I480 Paroxysmal atrial fibrillation: Secondary | ICD-10-CM | POA: Diagnosis not present

## 2023-06-15 DIAGNOSIS — C189 Malignant neoplasm of colon, unspecified: Secondary | ICD-10-CM | POA: Diagnosis not present

## 2023-06-15 DIAGNOSIS — E871 Hypo-osmolality and hyponatremia: Secondary | ICD-10-CM | POA: Diagnosis not present

## 2023-06-26 ENCOUNTER — Encounter: Payer: Self-pay | Admitting: Family Medicine

## 2023-06-26 ENCOUNTER — Ambulatory Visit: Payer: Medicare HMO | Admitting: Family Medicine

## 2023-06-26 VITALS — BP 128/58 | HR 71 | Temp 96.3°F | Ht 63.0 in | Wt 179.0 lb

## 2023-06-26 DIAGNOSIS — J3089 Other allergic rhinitis: Secondary | ICD-10-CM

## 2023-06-26 DIAGNOSIS — R052 Subacute cough: Secondary | ICD-10-CM | POA: Diagnosis not present

## 2023-06-26 MED ORDER — BENZONATATE 100 MG PO CAPS
100.0000 mg | ORAL_CAPSULE | Freq: Three times a day (TID) | ORAL | 0 refills | Status: DC | PRN
Start: 2023-06-26 — End: 2023-07-24

## 2023-06-26 MED ORDER — DESLORATADINE 5 MG PO TABS
5.0000 mg | ORAL_TABLET | Freq: Every day | ORAL | 3 refills | Status: DC
Start: 2023-06-26 — End: 2023-07-31

## 2023-06-26 NOTE — Progress Notes (Signed)
Subjective: CC: cough PCP: [name], DO Gabrielle [name] is a 77 y.o. female presenting to clinic today for:  1. Cough Patient reports that she has intermittent cough that seems to affect the left side of her throat.  Sometimes she has left ear fullness.  This has been ongoing for several months.  No hemoptysis, unplanned weight loss or night sweats reported.  She is under the care of hematology/oncology for malignant neoplasm of the colon and is closely followed with labs and checkups.  She has taken [employer] which has been helpful in the past and utilizes Flonase.  She uses no allergy medicine.  After further questioning she admits that there may be mold in her home and has an inspection coming up soon to further evaluate this.  She reports some postnasal drainage where she feels like she needs to clear her throat.  She denies any wheezing, shortness of breath, reflux symptoms.  She drinks plenty of water   ROS: Per HPI  Allergies  Allergen Reactions   Lisinopril Cough   Past Medical History:  Diagnosis Date   Arthritis    Cancer (HCC)    Cataract    OU   Colon cancer (HCC)    Family history of pancreatic cancer 08/21/2020   Family history of uterine cancer 08/21/2020   H/O cesarean section    Hx of tonsillectomy    Hyperlipidemia    Hypertension    Macular degeneration    Exu ARMD OU   Paroxysmal atrial fibrillation (HCC) 07/05/2020   Stroke (cerebrum) (HCC) 07/05/2020   Urinary retention 04/15/2020    Current Outpatient Medications:    alendronate (FOSAMAX) 70 MG tablet, TAKE ONE TABLET EVERY 7 DAYS ON AN EMPTY STOMACH WITH A FULL GLASS OF WATER, Disp: 12 tablet, Rfl: 3   amLODipine (NORVASC) 10 MG tablet, Take 1 tablet (10 mg total) by mouth daily. For blood pressure, Disp: 90 tablet, Rfl: 3   atorvastatin (LIPITOR) 20 MG tablet, Take 1 tablet (20 mg total) by mouth daily. For cholesterol, Disp: 90 tablet, Rfl: 3   benzonatate (TESSALON) 100 MG  capsule, TAKE ONE CAPSULE TWICE DAILY AS NEEDED FOR COUGH, Disp: 20 capsule, Rfl: 0   cholecalciferol (VITAMIN D3) 25 MCG (1000 UT) tablet, Take 1,000 Units by mouth daily., Disp: , Rfl:    CRANBERRY FRUIT PO, Take 1 tablet by mouth daily., Disp: , Rfl:    diclofenac Sodium (VOLTAREN) 1 % GEL, APPLY 4 GRAMS TO AFFECTED AREA(S) 4 TIMES A DAY, Disp: 400 g, Rfl: 2   dorzolamide-timolol (COSOPT) 2-0.5 % ophthalmic solution, INSTILL ONE DROP IN EACH EYE TWICE DAILY, Disp: 10 mL, Rfl: 10   dorzolamide-timolol (COSOPT) 2-0.5 % ophthalmic solution, Place 1 drop into the right eye 2 (two) times daily., Disp: 3 mL, Rfl: 11   ELIQUIS 5 MG TABS tablet, Take 1 tablet (5 mg total) by mouth 2 (two) times daily., Disp: 180 tablet, Rfl: 3   fluticasone (FLONASE) 50 MCG/ACT nasal spray, Place 2 sprays into both nostrils daily., Disp: 16 g, Rfl: 6   hydrALAZINE (APRESOLINE) 25 MG tablet, Take 1 tablet (25 mg total) by mouth 2 (two) times daily., Disp: 180 tablet, Rfl: 3   losartan-hydrochlorothiazide (HYZAAR) 100-25 MG tablet, Take 1 tablet by mouth daily., Disp: 90 tablet, Rfl: 3   potassium chloride SA (KLOR-CON M) 20 MEQ tablet, Take 1 tablet (20 mEq total) by mouth 3 (three) times daily., Disp: 90 tablet, Rfl: 3   spironolactone (ALDACTONE)  25 MG tablet, Take 1 tablet (25 mg total) by mouth daily., Disp: 90 tablet, Rfl: 3 Social History   Socioeconomic History   Marital status: Divorced    Spouse name: Not on file   Number of children: Not on file   Years of education: Not on file   Highest education level: Not on file  Occupational History   Not on file  Tobacco Use   Smoking status: Never   Smokeless tobacco: Never  Vaping Use   Vaping status: Never Used  Substance and Sexual Activity   Alcohol use: No   Drug use: No   Sexual activity: Not Currently  Other Topics Concern   Not on file  Social History Narrative   Not on file   Social Determinants of Health   Financial Resource Strain: Low  Risk  (03/15/2023)   Overall Financial Resource Strain (CARDIA)    Difficulty of Paying Living Expenses: Not hard at all  Food Insecurity: No Food Insecurity (03/15/2023)   Hunger Vital Sign    Worried About Running Out of Food in the Last Year: Never true    Ran Out of Food in the Last Year: Never true  Transportation Needs: No Transportation Needs (03/15/2023)   PRAPARE - Administrator, Civil Service (Medical): No    Lack of Transportation (Non-Medical): No  Physical Activity: Sufficiently Active (03/15/2023)   Exercise Vital Sign    Days of Exercise per Week: 5 days    Minutes of Exercise per Session: 30 min  Stress: No Stress Concern Present (03/15/2023)   [employer] of Occupational Health - Occupational Stress Questionnaire    Feeling of Stress : Not at all  Social Connections: Moderately Integrated (03/15/2023)   Social Connection and Isolation Panel [NHANES]    Frequency of Communication with Friends and Family: More than three times a week    Frequency of Social Gatherings with Friends and Family: More than three times a week    Attends Religious Services: More than 4 times per year    Active Member of [employer] or Organizations: Yes    Attends Banker Meetings: More than 4 times per year    Marital Status: Widowed  Intimate Partner Violence: Not At Risk (03/15/2023)   Humiliation, Afraid, Rape, and Kick questionnaire    Fear of Current or Ex-Partner: No    Emotionally Abused: No    Physically Abused: No    Sexually Abused: No   Family History  Problem Relation Age of Onset   Macular degeneration Mother    Stroke Mother    Hypertension Mother    Dementia Mother    Lung cancer Father        dx late 74s; smoking hx   Diabetes Sister    Hypertension Sister    Leukemia Paternal Uncle        d. 9s   Cancer Maternal Aunt        ovarian or endometrial dx 30s   Pancreatic cancer Paternal Uncle        d. late 9s    Objective: Office vital  signs reviewed. BP (!) 128/58   Pulse 71   Temp (!) 96.3 F (35.7 C) (Temporal)   Ht 5\' 3"  (1.6 m)   Wt 179 lb (81.2 kg)   SpO2 98%   BMI 31.71 kg/m   Physical Examination:  General: Awake, alert, well nourished, No acute distress HEENT: TMs dull in appearance with slight bulging of the left  TM.  She wears hearing aids.  Oropharynx without masses or erosion she has mild cobblestone appearance, particularly on the left compared to the right.  Mucous membranes are moist.  No significant erythema of the nasal turbinates but mild edema particularly on the left compared to the right.  No lymphadenopathy appreciated in the cervical region Cardio: regular rate and rhythm, S1S2 heard, no murmurs appreciated Pulm: clear to auscultation bilaterally, no wheezes, rhonchi or rales; normal work of breathing on room air   Assessment/ Plan: 77 y.o. female   Subacute cough - Plan: desloratadine (CLARINEX) 5 MG tablet, benzonatate (TESSALON) 100 MG capsule  Non-seasonal allergic rhinitis due to fungal spores - Plan: desloratadine (CLARINEX) 5 MG tablet  ?  Allergic in nature.  Possible mold exposures.  Clarinex ordered.  Okay to continue Flonase for now.  Tessalon Perles prescribed.  I am certainly glad to trial off of one of her diuretics if she feels like she is becoming too dry.  I would favor discontinuing the hydrochlorothiazide though we would need to see her closely back for blood pressure recheck if this were the case.  She will contact me in the next couple of weeks to let me know how things are going  No orders of the defined types were placed in this encounter.  No orders of the defined types were placed in this encounter.    [name], DO [facility] [phone]

## 2023-06-26 NOTE — Patient Instructions (Signed)
Clarinex sent for allergies.  If you have drainage, this should help. If it doesn't, let me know in 2-4 weeks.

## 2023-06-30 ENCOUNTER — Other Ambulatory Visit: Payer: Self-pay | Admitting: Hematology

## 2023-07-03 ENCOUNTER — Encounter: Payer: Self-pay | Admitting: Hematology

## 2023-07-05 ENCOUNTER — Inpatient Hospital Stay: Payer: Medicare HMO | Admitting: Hematology

## 2023-07-05 ENCOUNTER — Inpatient Hospital Stay: Payer: Medicare HMO

## 2023-07-05 VITALS — BP 136/57 | HR 72 | Temp 97.8°F | Resp 18

## 2023-07-05 DIAGNOSIS — Z79620 Long term (current) use of immunosuppressive biologic: Secondary | ICD-10-CM | POA: Diagnosis not present

## 2023-07-05 DIAGNOSIS — Z5112 Encounter for antineoplastic immunotherapy: Secondary | ICD-10-CM | POA: Diagnosis not present

## 2023-07-05 DIAGNOSIS — E875 Hyperkalemia: Secondary | ICD-10-CM

## 2023-07-05 DIAGNOSIS — C189 Malignant neoplasm of colon, unspecified: Secondary | ICD-10-CM

## 2023-07-05 DIAGNOSIS — C182 Malignant neoplasm of ascending colon: Secondary | ICD-10-CM | POA: Diagnosis not present

## 2023-07-05 DIAGNOSIS — E876 Hypokalemia: Secondary | ICD-10-CM | POA: Diagnosis not present

## 2023-07-05 DIAGNOSIS — Z79899 Other long term (current) drug therapy: Secondary | ICD-10-CM | POA: Diagnosis not present

## 2023-07-05 LAB — COMPREHENSIVE METABOLIC PANEL
ALT: 13 U/L (ref 0–44)
AST: 18 U/L (ref 15–41)
Albumin: 4.5 g/dL (ref 3.5–5.0)
Alkaline Phosphatase: 81 U/L (ref 38–126)
Anion gap: 8 (ref 5–15)
BUN: 27 mg/dL — ABNORMAL HIGH (ref 8–23)
CO2: 26 mmol/L (ref 22–32)
Calcium: 10.2 mg/dL (ref 8.9–10.3)
Chloride: 90 mmol/L — ABNORMAL LOW (ref 98–111)
Creatinine, Ser: 1.26 mg/dL — ABNORMAL HIGH (ref 0.44–1.00)
GFR, Estimated: 44 mL/min — ABNORMAL LOW (ref >60)
Glucose, Bld: 89 mg/dL (ref 70–99)
Potassium: 5.2 mmol/L — ABNORMAL HIGH (ref 3.5–5.1)
Sodium: 124 mmol/L — ABNORMAL LOW (ref 135–145)
Total Bilirubin: 0.4 mg/dL (ref 0.3–1.2)
Total Protein: 8 g/dL (ref 6.5–8.1)

## 2023-07-05 LAB — CBC WITH DIFFERENTIAL/PLATELET
Abs Immature Granulocytes: 0.04 10*3/uL (ref 0.00–0.07)
Basophils Absolute: 0 10*3/uL (ref 0.0–0.1)
Basophils Relative: 0 %
Eosinophils Absolute: 0.4 10*3/uL (ref 0.0–0.5)
Eosinophils Relative: 5 %
HCT: 36 % (ref 36.0–46.0)
Hemoglobin: 12.1 g/dL (ref 12.0–15.0)
Immature Granulocytes: 1 %
Lymphocytes Relative: 19 %
Lymphs Abs: 1.4 10*3/uL (ref 0.7–4.0)
MCH: 30.8 pg (ref 26.0–34.0)
MCHC: 33.6 g/dL (ref 30.0–36.0)
MCV: 91.6 fL (ref 80.0–100.0)
Monocytes Absolute: 0.9 10*3/uL (ref 0.1–1.0)
Monocytes Relative: 12 %
Neutro Abs: 4.7 10*3/uL (ref 1.7–7.7)
Neutrophils Relative %: 63 %
Platelets: 181 10*3/uL (ref 150–400)
RBC: 3.93 MIL/uL (ref 3.87–5.11)
RDW: 12.8 % (ref 11.5–15.5)
WBC: 7.4 10*3/uL (ref 4.0–10.5)
nRBC: 0 % (ref 0.0–0.2)

## 2023-07-05 LAB — MAGNESIUM: Magnesium: 1.7 mg/dL (ref 1.7–2.4)

## 2023-07-05 MED ORDER — SODIUM CHLORIDE 0.9 % IV SOLN
200.0000 mg | Freq: Once | INTRAVENOUS | Status: AC
  Administered 2023-07-05: 200 mg via INTRAVENOUS
  Filled 2023-07-05: qty 8

## 2023-07-05 MED ORDER — HEPARIN SOD (PORK) LOCK FLUSH 100 UNIT/ML IV SOLN
500.0000 [IU] | Freq: Once | INTRAVENOUS | Status: AC | PRN
  Administered 2023-07-05: 500 [IU]

## 2023-07-05 MED ORDER — INSULIN ASPART 100 UNIT/ML IJ SOLN
10.0000 [IU] | Freq: Once | INTRAMUSCULAR | Status: AC
  Administered 2023-07-05: 10 [IU] via INTRAVENOUS
  Filled 2023-07-05: qty 0.1

## 2023-07-05 MED ORDER — SODIUM CHLORIDE 0.9 % IV SOLN: Freq: Once | INTRAVENOUS | Status: AC

## 2023-07-05 MED ORDER — SODIUM CHLORIDE 0.9% FLUSH
10.0000 mL | INTRAVENOUS | Status: DC | PRN
  Administered 2023-07-05: 10 mL

## 2023-07-05 MED ORDER — DEXTROSE 50 % IV SOLN
50.0000 mL | Freq: Once | INTRAVENOUS | Status: AC
  Administered 2023-07-05: 50 mL via INTRAVENOUS
  Filled 2023-07-05: qty 50

## 2023-07-05 NOTE — Patient Instructions (Signed)

## 2023-07-05 NOTE — Progress Notes (Signed)
Reviewed Potassium 5.2 today with provider with verbal order Insulin and D50 IV.  See MAR for details.   Patient tolerated therapy with no complaints voiced.  Side effects with management reviewed with understanding verbalized.  Port site clean and dry with no bruising or swelling noted at site.  Good blood return noted before and after administration of therapy.  Band aid applied.  Patient left in satisfactory condition with VSS and no s/s of distress noted.

## 2023-07-05 NOTE — Progress Notes (Signed)
       Pryor Ochoa, PharmD

## 2023-07-10 ENCOUNTER — Other Ambulatory Visit: Payer: Self-pay | Admitting: Family Medicine

## 2023-07-10 DIAGNOSIS — R052 Subacute cough: Secondary | ICD-10-CM

## 2023-07-13 NOTE — Progress Notes (Signed)
[facility] - Clinic Note  07/17/2023    CHIEF COMPLAINT Patient presents for Retina Follow Up  HISTORY OF PRESENT ILLNESS: Gabrielle [name] is a 77 y.o. female who presents to the clinic today for:   HPI     Retina Follow Up   Patient presents with  Wet AMD.  In both eyes.  This started 6 weeks ago.  Duration of 6 weeks.  Since onset it is stable.  I, the attending physician,  performed the HPI with the patient and updated documentation appropriately.        Comments   6 week retina follow up ARMD and IVV OS pt is reporting no vision changes noticed she denies any flashes or floaters       Last edited by [name], MD on 07/17/2023  9:05 AM.    Patient states   Referring physician: [name], DO [address] [place],  [place] [address]  HISTORICAL INFORMATION:   Selected notes from the MEDICAL RECORD NUMBER Referred by Dr. Mercie Eon for concern of ARMD OU   CURRENT MEDICATIONS: Current Outpatient Medications (Ophthalmic Drugs)  Medication Sig   dorzolamide-timolol (COSOPT) 2-0.5 % ophthalmic solution INSTILL ONE DROP IN [facility] TWICE DAILY   dorzolamide-timolol (COSOPT) 2-0.5 % ophthalmic solution Place 1 drop into the right eye 2 (two) times daily.   No current facility-administered medications for this visit. (Ophthalmic Drugs)   Current Outpatient Medications (Other)  Medication Sig   alendronate (FOSAMAX) 70 MG tablet TAKE ONE TABLET EVERY 7 DAYS ON AN EMPTY STOMACH WITH A FULL GLASS OF WATER   amLODipine (NORVASC) 10 MG tablet Take 1 tablet (10 mg total) by mouth daily. For blood pressure   atorvastatin (LIPITOR) 20 MG tablet Take 1 tablet (20 mg total) by mouth daily. For cholesterol   benzonatate (TESSALON) 100 MG capsule Take 1 capsule (100 mg total) by mouth 3 (three) times daily as needed for cough.   cholecalciferol (VITAMIN D3) 25 MCG (1000 UT) tablet Take 1,000 Units by mouth daily.   CRANBERRY FRUIT PO Take 1 tablet by  mouth daily.   desloratadine (CLARINEX) 5 MG tablet Take 1 tablet (5 mg total) by mouth daily. For allergies/ drainage   diclofenac Sodium (VOLTAREN) 1 % GEL APPLY 4 GRAMS TO AFFECTED AREA(S) 4 TIMES A DAY   ELIQUIS 5 MG TABS tablet Take 1 tablet (5 mg total) by mouth 2 (two) times daily.   fluticasone (FLONASE) 50 MCG/ACT nasal spray Place 2 sprays into both nostrils daily.   hydrALAZINE (APRESOLINE) 25 MG tablet Take 1 tablet (25 mg total) by mouth 2 (two) times daily.   losartan-hydrochlorothiazide (HYZAAR) 100-25 MG tablet Take 1 tablet by mouth daily.   potassium chloride SA (KLOR-CON M) 20 MEQ tablet TAKE 1 TABLET 3 TIMES A DAY   spironolactone (ALDACTONE) 25 MG tablet Take 1 tablet (25 mg total) by mouth daily.   No current facility-administered medications for this visit. (Other)   REVIEW OF SYSTEMS: ROS   Positive for: Gastrointestinal, Genitourinary, Musculoskeletal, Cardiovascular, Eyes Negative for: Constitutional, Neurological, Skin, HENT, Endocrine, Respiratory, Psychiatric, Allergic/Imm, Heme/Lymph Last edited by Etheleen Mayhew, COT on 07/17/2023  7:45 AM.       ALLERGIES Allergies  Allergen Reactions   Lisinopril Cough   PAST MEDICAL HISTORY Past Medical History:  Diagnosis Date   Arthritis    Cancer [facility])    Cataract    OU   Colon cancer (HCC)  Family history of pancreatic cancer 08/21/2020   Family history of uterine cancer 08/21/2020   H/O cesarean section    Hx of tonsillectomy    Hyperlipidemia    Hypertension    Macular degeneration    Exu ARMD OU   Paroxysmal atrial fibrillation (HCC) 07/05/2020   Stroke (cerebrum) (HCC) 07/05/2020   Urinary retention 04/15/2020   Past Surgical History:  Procedure Laterality Date   BIOPSY  04/06/2020   Procedure: BIOPSY;  Surgeon: Malissa Hippo, MD;  Location: [facility];  Service: Endoscopy;;   CARPAL TUNNEL RELEASE Right    CATARACT EXTRACTION W/PHACO Left 10/11/2019   Procedure: CATARACT  EXTRACTION PHACO AND INTRAOCULAR LENS PLACEMENT (IOC);  Surgeon: Fabio Pierce, MD;  Location: [facility] ORS;  Service: Ophthalmology;  Laterality: Left;  CDE: 8.56   CATARACT EXTRACTION W/PHACO Right 10/25/2019   Procedure: CATARACT EXTRACTION PHACO AND INTRAOCULAR LENS PLACEMENT (IOC);  Surgeon: Fabio Pierce, MD;  Location: [facility];  Service: Ophthalmology;  Laterality: Right;  CDE: 5.67   CESAREAN SECTION     COLONOSCOPY N/A 04/06/2020   Procedure: COLONOSCOPY;  Surgeon: Malissa Hippo, MD;  Location: [facility];  Service: Endoscopy;  Laterality: N/A;   CYSTOSCOPY WITH BIOPSY N/A 04/08/2020   Procedure: CYSTOSCOPY WITH BIOPSY;  Surgeon: Malen Gauze, MD;  Location: [facility];  Service: Urology;  Laterality: N/A;   ESOPHAGOGASTRODUODENOSCOPY N/A 04/05/2020   Procedure: ESOPHAGOGASTRODUODENOSCOPY (EGD);  Surgeon: Malissa Hippo, MD;  Location: [facility];  Service: Endoscopy;  Laterality: N/A;   PARTIAL COLECTOMY N/A 04/08/2020   Procedure: PARTIAL COLECTOMY;  Surgeon: Lucretia Roers, MD;  Location: [facility];  Service: General;  Laterality: N/A;   PORTACATH PLACEMENT Left 05/18/2020   Procedure: INSERTION PORT-A-CATH (ATTACHED CATHETER IN LEFT SUBCLAVIAN);  Surgeon: Lucretia Roers, MD;  Location: [facility];  Service: General;  Laterality: Left;   TONSILLECTOMY     FAMILY HISTORY Family History  Problem Relation Age of Onset   Macular degeneration Mother    Stroke Mother    Hypertension Mother    Dementia Mother    Lung cancer Father        dx late 30s; smoking hx   Diabetes Sister    Hypertension Sister    Leukemia Paternal Uncle        d. 75s   Cancer Maternal Aunt        ovarian or endometrial dx 79s   Pancreatic cancer Paternal Uncle        d. late 64s   SOCIAL HISTORY Social History   Tobacco Use   Smoking status: Never   Smokeless tobacco: Never  Vaping Use   Vaping status: Never Used  Substance Use Topics   Alcohol use: No   Drug use: No       OPHTHALMIC  EXAM: Base Eye Exam     Visual Acuity (Snellen - Linear)       Right Left   Dist cc CF at 3' 20/70 -1   Dist ph cc NI NI         Tonometry (Tonopen, 7:48 AM)       Right Left   Pressure 10 13         Pupils       Pupils Dark Light Shape React APD   Right PERRL 3 2 Round Brisk None   Left PERRL 3 2 Round Brisk None         Visual Fields  Left Right    Full Full         Extraocular Movement       Right Left    Full, Ortho Full, Ortho         Neuro/Psych     Oriented x3: Yes   Mood/Affect: Normal         Dilation     Both eyes: 2.5% Phenylephrine @ 7:48 AM           Slit Lamp and Fundus Exam     Slit Lamp Exam       Right Left   Lids/Lashes Dermatochalasis - upper lid Dermatochalasis - upper lid   Conjunctiva/Sclera 1+ Injection Trace Injection   Cornea Arcus, 3+ fine Punctpate epithelial erosions Arcus, 3+ fine Punctate epithelial erosions, mild tear film debris   Anterior Chamber Deep and quiet Deep and quiet   Iris Round and well dilated Round and well dilated   Lens PCIOL in good position, 1+ Posterior capsular opacification PC IOL in good position with open PC   Anterior Vitreous Vitreous syneresis, Posterior vitreous detachment, vitreous condensations Vitreous syneresis, Posterior vitreous detachment, silicone oil micro drops         Fundus Exam       Right Left   Disc pink and sharp, compact mild pallor, sharp, Compact, vascular loops superiorly   C/D Ratio 0.2 0.2   Macula Blunted foveal reflex, large central GA, Drusen, Pigment clumping and atrophy, central thickening/pigmented disciform scar, +PED, persistent punctate IRH superior macula within atrophy--slightly increased, persistent trace cystic changes overlying central scar - stable Blunted foveal reflex, central thickening, RPE clumping and atrophy, Drusen, trace cystic changes overlying PED - stably improved, no heme   Vessels Vascular attenuation, Tortuous Vascular  attenuation, Tortuous   Periphery Attached, mild Reticular degeneration Attached, mild Reticular degeneration           Refraction     Wearing Rx       Sphere Cylinder Add   Right [place] Sphere +3.50   Left -0.50 Sphere +3.50    Type: PAL           IMAGING AND PROCEDURES  Imaging and Procedures for 03/21/18  OCT, Retina - OU - Both Eyes       Right Eye Quality was good. Central Foveal Thickness: 497. Progression has been stable. Findings include no SRF, abnormal foveal contour, retinal drusen , outer retinal tubulation, subretinal hyper-reflective material, disciform scar, epiretinal membrane, intraretinal fluid, pigment epithelial detachment, outer retinal atrophy (Stable improvement in sub-retinal scar and central cystic changes within large area of central GA).   Left Eye Quality was good. Central Foveal Thickness: 283. Progression has been stable. Findings include no SRF, abnormal foveal contour, retinal drusen , subretinal hyper-reflective material, intraretinal fluid, pigment epithelial detachment, outer retinal atrophy (Stable improvement in cystic changes nasal fovea and macula overlying PED/SRHM ).   Notes *Images captured and stored on drive  Diagnosis / Impression:  Exudative ARMD OU OD: Stable improvement in sub-retinal scar and central cystic changes within large area of central GA OS: stable improvement in cystic changes nasal fovea and macula overlying PED/SRHM  Clinical management:  See below  Abbreviations: NFP - Normal foveal profile. CME - cystoid macular edema. PED - pigment epithelial detachment. IRF - intraretinal fluid. SRF - subretinal fluid. EZ - ellipsoid zone. ERM - epiretinal membrane. ORA - outer retinal atrophy. ORT - outer retinal tubulation. SRHM - subretinal hyper-reflective material      Intravitreal Injection,  Pharmacologic Agent - OS - Left Eye       Time Out 07/17/2023. 7:55 AM. Confirmed correct patient, procedure, site, and  patient consented.   Anesthesia Topical anesthesia was used. Anesthetic medications included Lidocaine 2%, Proparacaine 0.5%.   Procedure Preparation included 5% betadine to ocular surface, eyelid speculum. A (32g) needle was used.   Injection: 6 mg faricimab-svoa 6 MG/0.05ML   Route: Intravitreal, Site: Left Eye   NDC: O8010301, Lot: [id number], Expiration date: 08/04/2025, Waste: 0 mL   Post-op Post injection exam found visual acuity of at least counting fingers. The patient tolerated the procedure well. There were no complications. The patient received written and verbal post procedure care education. Post injection medications were not given.      Intravitreal Injection, Pharmacologic Agent - OD - Right Eye       Time Out 07/17/2023. 8:56 AM. Confirmed correct patient, procedure, site, and patient consented.   Anesthesia Topical anesthesia was used. Anesthetic medications included Lidocaine 2%, Proparacaine 0.5%.   Procedure Preparation included 5% betadine to ocular surface, eyelid speculum. A (33g) needle was used.   Injection: 2 mg aflibercept 2 MG/0.05ML   Route: Intravitreal, Site: Right Eye   NDC: L6038910, Lot: [id number], Expiration date: 03/04/2024, Waste: 0 mL   Post-op Post injection exam found visual acuity of at least counting fingers. The patient tolerated the procedure well. There were no complications. The patient received written and verbal post procedure care education.             ASSESSMENT/PLAN:    ICD-10-CM   1. Exudative age-related macular degeneration of both eyes with active choroidal neovascularization (HCC)  H35.3231 OCT, Retina - OU - Both Eyes    Intravitreal Injection, Pharmacologic Agent - OS - Left Eye    Intravitreal Injection, Pharmacologic Agent - OD - Right Eye    faricimab-svoa (VABYSMO) 6mg /0.76mL intravitreal injection    aflibercept (EYLEA) SOLN 2 mg    2. Posterior vitreous detachment of both eyes  H43.813     3.  Essential hypertension  I10     4. Hypertensive retinopathy of both eyes  H35.033     5. Pseudophakia of both eyes  Z96.1     6. Ocular hypertension, bilateral  H40.053     7. Dry eyes  H04.123      1. Exudative age-related macular degeneration, both eyes - severe exudative disease with very active CNVM OU at presentation in January 2019  - S/P IVA OD #1 (01.04.19), #2 (02.15.19)  - S/P IVA OS #1 (01.18.19), #2 (02.15.19)  - switched to Eylea 3.18.19 due to severity of disease - S/P IVE OD #1 (03.18.19), #2 (04.17.19), #3 (05.15.19), #4 (10.02.19),  #5 (01.15.20), #6 (06.04.20), #7 (10.29.20), #8 (01.21.21), #9 (05.27.21), #10 (07.22.21) -- injections held due to stable disciform scar - S/P IVE OS #1 (03.18.19), #2 (04.17.19), #3 (05.15.19), #4 (06.12.19), #5 (07.10.19), #6 (08.07.19), #7 (09.04.19), #8 (10.02.19), #9 (11.06.19), #10 (12.11.19), #11 (01.15.20), #12 (02.20.20), #13 (03.26.20), #14 (04.30.20), #15 (06.04.20), #16 (07.16.20), # 17 (08.20.20), #18 (09.24.20), #19 (10.29.20), #20 (12.03.20), #21 (01.21.21), #22 (03.18.21), #23 (05.27.21), #24 (07.22.21), #25 (11.11.21), #26 (12.09.21), #27 (01.06.22), #28 (2.7.22), #29 (03.24.22), #30 (4.28.22), #31 (05.31.22), #32 (06.30.22), #33 (08.04.22), #34 (09.15.22), #35 (10.27.22), #36 (12.08.22), #37 (01.12.23), #38 (02.16.23), #39 (03.30.23), #40 (05.11.23), #41 (06.22.23), #42 (07.27.23), #43 (09.01.23), #44 (10.06.23), #45 (11.10.23), #46 (12.15.23) -- IVE resistance  - s/p IVV OS #1 (01.12.24 -- sample), #2 (02.09.24), #3 (03.15.24), #4 (04.19.24), #5 (05.24.24), #  6 (07.01.24)  - IVE OS held from 7.22.21 to 11.11.21 due to TIA/stroke on 8.1.21 - OCT OD: Stable improvement in sub-retinal scar and cystic changes overlying within large area of central GA; OS: stable improvement in cystic changes nasal fovea and macula overlying PED/SRHM at 6 weeks  - exam OD with focal IRH superior macula within atrophy -- slightly increased  - BCVA OD  stable at CF 3'; OS stable at 20/70   - recommend IVE OD #11 (for persistent  IRH) and IVV OS #7 today, 08.12.24 w/ f/u at 6 wks again - IVV authorization obtained - RBA of procedure discussed, questions answered - IVV informed consent obtained and signed on 01.12.24 - IVE informed consent obtained and signed on 08.12.24 - see procedure note   - Good Days approved for 2024 - f/u in 6 weeks, sooner prn for DFE/OCT/possible injection(s)  2. PVD / vitreous syneresis  - Discussed findings and prognosis   - No RT or RD on 360 exam  - Reviewed s/s of RT/RD  - strict return precautions for any such RT/RD symptoms  3,4. Hypertensive retinopathy OU - exam shows persistent focal IRH OD within GA - discussed importance of tight BP control - monitor  5. Pseudophakia OU  - s/p CE/IOL (Dr. June Leap, 11.2020)  - beautiful surgeries w/ IOLs in excellent position, doing well  - monitor  6. Ocular Hypertension OU  - IOP 10,13  - continue cosopt bid OU  7. Dry eyes OU - recommend artificial tears and lubricating ointment as needed  Ophthalmic Meds Ordered this visit:  Meds ordered this encounter  Medications   faricimab-svoa (VABYSMO) 6mg /0.39mL intravitreal injection   aflibercept (EYLEA) SOLN 2 mg     Return in about 6 weeks (around 08/28/2023) for f/u exu ARMD OU, DFE, OCT.  There are no Patient Instructions on file for this visit.  This document serves as a record of services personally performed by Karie Chimera, MD, PhD. It was created on their behalf by Annalee Genta, COMT. The creation of this record is the provider's dictation and/or activities during the visit.  Electronically signed by: Annalee Genta, COMT 07/17/23 9:07 AM  This document serves as a record of services personally performed by Karie Chimera, MD, PhD. It was created on their behalf by Glee Arvin. Manson Passey, OA an ophthalmic technician. The creation of this record is the provider's dictation and/or activities during the  visit.    Electronically signed by: Glee Arvin. Manson Passey, OA 07/17/23 9:07 AM  Karie Chimera, M.D., Ph.D. Diseases & Surgery of the Retina and Vitreous [facility] 07/17/2023   I have reviewed the above documentation for accuracy and completeness, and I agree with the above. Karie Chimera, M.D., Ph.D. 07/17/23 9:09 AM   Abbreviations: M myopia (nearsighted); A astigmatism; H hyperopia (farsighted); P presbyopia; Mrx spectacle prescription;  CTL contact lenses; OD right eye; OS left eye; OU both eyes  XT exotropia; ET esotropia; PEK punctate epithelial keratitis; PEE punctate epithelial erosions; DES dry eye syndrome; MGD meibomian gland dysfunction; ATs artificial tears; PFAT's preservative free artificial tears; NSC nuclear sclerotic cataract; PSC posterior subcapsular cataract; ERM epi-retinal membrane; PVD posterior vitreous detachment; RD retinal detachment; DM diabetes mellitus; DR diabetic retinopathy; NPDR non-proliferative diabetic retinopathy; PDR proliferative diabetic retinopathy; CSME clinically significant macular edema; DME diabetic macular edema; dbh dot blot hemorrhages; CWS cotton wool spot; POAG primary open angle glaucoma; C/D cup-to-disc ratio; HVF humphrey visual field; GVF goldmann visual  field; OCT optical coherence tomography; IOP intraocular pressure; BRVO Branch retinal vein occlusion; CRVO central retinal vein occlusion; CRAO central retinal artery occlusion; BRAO branch retinal artery occlusion; RT retinal tear; SB scleral buckle; PPV pars plana vitrectomy; VH Vitreous hemorrhage; PRP panretinal laser photocoagulation; IVK intravitreal kenalog; VMT vitreomacular traction; MH Macular hole;  NVD neovascularization of the disc; NVE neovascularization elsewhere; AREDS age related eye disease study; ARMD age related macular degeneration; POAG primary open angle glaucoma; EBMD epithelial/anterior basement membrane dystrophy; ACIOL anterior chamber intraocular  lens; IOL intraocular lens; PCIOL posterior chamber intraocular lens; Phaco/IOL phacoemulsification with intraocular lens placement; PRK photorefractive keratectomy; LASIK laser assisted in situ keratomileusis; HTN hypertension; DM diabetes mellitus; COPD chronic obstructive pulmonary disease

## 2023-07-17 ENCOUNTER — Encounter (INDEPENDENT_AMBULATORY_CARE_PROVIDER_SITE_OTHER): Payer: Self-pay | Admitting: Ophthalmology

## 2023-07-17 ENCOUNTER — Ambulatory Visit (INDEPENDENT_AMBULATORY_CARE_PROVIDER_SITE_OTHER): Payer: Medicare HMO | Admitting: Ophthalmology

## 2023-07-17 DIAGNOSIS — Z961 Presence of intraocular lens: Secondary | ICD-10-CM

## 2023-07-17 DIAGNOSIS — H353231 Exudative age-related macular degeneration, bilateral, with active choroidal neovascularization: Secondary | ICD-10-CM

## 2023-07-17 DIAGNOSIS — H43813 Vitreous degeneration, bilateral: Secondary | ICD-10-CM | POA: Diagnosis not present

## 2023-07-17 DIAGNOSIS — H40053 Ocular hypertension, bilateral: Secondary | ICD-10-CM | POA: Diagnosis not present

## 2023-07-17 DIAGNOSIS — H35033 Hypertensive retinopathy, bilateral: Secondary | ICD-10-CM

## 2023-07-17 DIAGNOSIS — I10 Essential (primary) hypertension: Secondary | ICD-10-CM

## 2023-07-17 DIAGNOSIS — H04123 Dry eye syndrome of bilateral lacrimal glands: Secondary | ICD-10-CM

## 2023-07-17 MED ORDER — AFLIBERCEPT 2MG/0.05ML IZ SOLN FOR KALEIDOSCOPE
2.0000 mg | INTRAVITREAL | Status: AC | PRN
Start: 2023-07-17 — End: 2023-07-17
  Administered 2023-07-17: 2 mg via INTRAVITREAL

## 2023-07-17 MED ORDER — FARICIMAB-SVOA 6 MG/0.05ML IZ SOLN
6.0000 mg | INTRAVITREAL | Status: AC | PRN
Start: 2023-07-17 — End: 2023-07-17
  Administered 2023-07-17: 6 mg via INTRAVITREAL

## 2023-07-24 ENCOUNTER — Ambulatory Visit (INDEPENDENT_AMBULATORY_CARE_PROVIDER_SITE_OTHER): Payer: Medicare HMO | Admitting: Family Medicine

## 2023-07-24 ENCOUNTER — Encounter: Payer: Self-pay | Admitting: Family Medicine

## 2023-07-24 VITALS — BP 139/68 | HR 71 | Temp 98.9°F | Ht 63.0 in | Wt 180.2 lb

## 2023-07-24 DIAGNOSIS — R059 Cough, unspecified: Secondary | ICD-10-CM | POA: Diagnosis not present

## 2023-07-24 DIAGNOSIS — R0981 Nasal congestion: Secondary | ICD-10-CM

## 2023-07-24 MED ORDER — GUAIFENESIN-CODEINE 100-10 MG/5ML PO SOLN
5.0000 mL | Freq: Three times a day (TID) | ORAL | 0 refills | Status: DC | PRN
Start: 2023-07-24 — End: 2023-07-26

## 2023-07-24 MED ORDER — BENZONATATE 100 MG PO CAPS
100.0000 mg | ORAL_CAPSULE | Freq: Three times a day (TID) | ORAL | 0 refills | Status: DC | PRN
Start: 2023-07-24 — End: 2023-07-26

## 2023-07-24 NOTE — Progress Notes (Signed)
Subjective: CC: URI PCP: [name], DO NWG:NFAOZH K Tibbits is a 77 y.o. female presenting to clinic today for:  1.  Cough Patient reports that she had onset of cough that is dry, runny nose and just feeling generalized fatigue on Saturday.  She has been utilizing her Flonase, Clarinex.  No known sick contacts but did go out to late supper on Thursday at a [employer].  She reports no shortness of breath.  No hemoptysis.   ROS: Per HPI  Allergies  Allergen Reactions   Lisinopril Cough   Past Medical History:  Diagnosis Date   Arthritis    Cancer (HCC)    Cataract    OU   Colon cancer (HCC)    Family history of pancreatic cancer 08/21/2020   Family history of uterine cancer 08/21/2020   H/O cesarean section    Hx of tonsillectomy    Hyperlipidemia    Hypertension    Macular degeneration    Exu ARMD OU   Paroxysmal atrial fibrillation (HCC) 07/05/2020   Stroke (cerebrum) (HCC) 07/05/2020   Urinary retention 04/15/2020    Current Outpatient Medications:    alendronate (FOSAMAX) 70 MG tablet, TAKE ONE TABLET EVERY 7 DAYS ON AN EMPTY STOMACH WITH A FULL GLASS OF WATER, Disp: 12 tablet, Rfl: 3   amLODipine (NORVASC) 10 MG tablet, Take 1 tablet (10 mg total) by mouth daily. For blood pressure, Disp: 90 tablet, Rfl: 3   atorvastatin (LIPITOR) 20 MG tablet, Take 1 tablet (20 mg total) by mouth daily. For cholesterol, Disp: 90 tablet, Rfl: 3   benzonatate (TESSALON) 100 MG capsule, Take 1 capsule (100 mg total) by mouth 3 (three) times daily as needed for cough., Disp: 20 capsule, Rfl: 0   cholecalciferol (VITAMIN D3) 25 MCG (1000 UT) tablet, Take 1,000 Units by mouth daily., Disp: , Rfl:    CRANBERRY FRUIT PO, Take 1 tablet by mouth daily., Disp: , Rfl:    desloratadine (CLARINEX) 5 MG tablet, Take 1 tablet (5 mg total) by mouth daily. For allergies/ drainage, Disp: 90 tablet, Rfl: 3   diclofenac Sodium (VOLTAREN) 1 % GEL, APPLY 4 GRAMS TO AFFECTED AREA(S) 4  TIMES A DAY, Disp: 400 g, Rfl: 2   dorzolamide-timolol (COSOPT) 2-0.5 % ophthalmic solution, INSTILL ONE DROP IN EACH EYE TWICE DAILY, Disp: 10 mL, Rfl: 10   dorzolamide-timolol (COSOPT) 2-0.5 % ophthalmic solution, Place 1 drop into the right eye 2 (two) times daily., Disp: 3 mL, Rfl: 11   ELIQUIS 5 MG TABS tablet, Take 1 tablet (5 mg total) by mouth 2 (two) times daily., Disp: 180 tablet, Rfl: 3   fluticasone (FLONASE) 50 MCG/ACT nasal spray, Place 2 sprays into both nostrils daily., Disp: 16 g, Rfl: 6   hydrALAZINE (APRESOLINE) 25 MG tablet, Take 1 tablet (25 mg total) by mouth 2 (two) times daily., Disp: 180 tablet, Rfl: 3   losartan-hydrochlorothiazide (HYZAAR) 100-25 MG tablet, Take 1 tablet by mouth daily., Disp: 90 tablet, Rfl: 3   potassium chloride SA (KLOR-CON M) 20 MEQ tablet, TAKE 1 TABLET 3 TIMES A DAY, Disp: 90 tablet, Rfl: 3   spironolactone (ALDACTONE) 25 MG tablet, Take 1 tablet (25 mg total) by mouth daily., Disp: 90 tablet, Rfl: 3 Social History   Socioeconomic History   Marital status: Divorced    Spouse name: Not on file   Number of children: Not on file   Years of education: Not on file   Highest education level: Not on file  Occupational History   Not on file  Tobacco Use   Smoking status: Never   Smokeless tobacco: Never  Vaping Use   Vaping status: Never Used  Substance and Sexual Activity   Alcohol use: No   Drug use: No   Sexual activity: Not Currently  Other Topics Concern   Not on file  Social History Narrative   Not on file   Social Determinants of Health   Financial Resource Strain: Low Risk  (03/15/2023)   Overall Financial Resource Strain (CARDIA)    Difficulty of Paying Living Expenses: Not hard at all  Food Insecurity: No Food Insecurity (03/15/2023)   Hunger Vital Sign    Worried About Running Out of Food in the Last Year: Never true    Ran Out of Food in the Last Year: Never true  Transportation Needs: No Transportation Needs (03/15/2023)    PRAPARE - Administrator, Civil Service (Medical): No    Lack of Transportation (Non-Medical): No  Physical Activity: Sufficiently Active (03/15/2023)   Exercise Vital Sign    Days of Exercise per Week: 5 days    Minutes of Exercise per Session: 30 min  Stress: No Stress Concern Present (03/15/2023)   [employer] of Occupational Health - Occupational Stress Questionnaire    Feeling of Stress : Not at all  Social Connections: Moderately Integrated (03/15/2023)   Social Connection and Isolation Panel [NHANES]    Frequency of Communication with Friends and Family: More than three times a week    Frequency of Social Gatherings with Friends and Family: More than three times a week    Attends Religious Services: More than 4 times per year    Active Member of [employer] or Organizations: Yes    Attends Banker Meetings: More than 4 times per year    Marital Status: Widowed  Intimate Partner Violence: Not At Risk (03/15/2023)   Humiliation, Afraid, Rape, and Kick questionnaire    Fear of Current or Ex-Partner: No    Emotionally Abused: No    Physically Abused: No    Sexually Abused: No   Family History  Problem Relation Age of Onset   Macular degeneration Mother    Stroke Mother    Hypertension Mother    Dementia Mother    Lung cancer Father        dx late 70s; smoking hx   Diabetes Sister    Hypertension Sister    Leukemia Paternal Uncle        d. 43s   Cancer Maternal Aunt        ovarian or endometrial dx 57s   Pancreatic cancer Paternal Uncle        d. late 10s    Objective: Office vital signs reviewed. BP 139/68   Pulse 71   Temp 98.9 F (37.2 C)   Ht 5\' 3"  (1.6 m)   Wt 180 lb 3.2 oz (81.7 kg)   SpO2 98%   BMI 31.92 kg/m   Physical Examination:  General: Awake, alert, well nourished, No acute distress HEENT: sclera white, mmm, oropharynx with no significant erythema or exudates Cardio: regular rate and rhythm, S1S2 heard, no murmurs  appreciated Pulm: clear to auscultation bilaterally, no wheezes, rhonchi or rales; normal work of breathing on room air  Assessment/ Plan: 77 y.o. female   Cough in adult - Plan: COVID-19, Flu A+B and RSV, guaiFENesin-codeine 100-10 MG/5ML syrup, benzonatate (TESSALON) 100 MG capsule  Nasal congestion - Plan: COVID-19, Flu A+B  and RSV   Suspicious for COVID but 19.  Tessalon Perles, guaifenesin with codeine provided for as needed use.  Caution sedation.  National narcotic database reviewed and there were no red flags.  COVID testing sent.  Will plan for initiation of antiviral pending results.   [name], DO [facility] [phone]

## 2023-07-24 NOTE — Patient Instructions (Signed)
Cough, Adult A cough helps to clear your throat and lungs. It may be a sign of an illness or another condition. A short-term (acute) cough may last 2-3 weeks. A long-term (chronic) cough may last 8 or more weeks. Many things can cause a cough. They include: Illnesses such as: An infection in your throat or lungs. Asthma or other heart or lung problems. Gastroesophageal reflux. This is when acid comes back up from your stomach. Breathing in things that bother (irritate) your lungs. Allergies. Postnasal drip. This is when mucus runs down the back of your throat. Smoking. Some medicines. Follow these instructions at home: Medicines Take over-the-counter and prescription medicines only as told by your doctor. Talk with your doctor before you take cough medicine (cough suppressants). Eating and drinking Do not drink alcohol. Do not drink caffeine. Drink enough fluid to keep your pee (urine) pale yellow. Lifestyle Stay away from cigarette smoke. Do not smoke or use any products that contain nicotine or tobacco. If you need help quitting, ask your doctor. Stay away from things that make you cough. These may include perfume, candles, cleaning products, or campfire smoke. General instructions  Watch for any changes to your cough. Tell your doctor about them. Always cover your mouth when you cough. If the air is dry in your home, use a cool mist vaporizer or humidifier. If your cough is worse at night, try using extra pillows to raise your head up higher while you sleep. Rest as needed. Contact a doctor if: You have new symptoms. Your symptoms get worse. You cough up pus. You have a fever that does not go away. Your cough does not get better after 2-3 weeks. Cough medicine does not help, and you are not sleeping well. You have pain that gets worse or is not helped with medicine. You are losing weight and do not know why. You have night sweats. Get help right away if: You cough up  blood. You have trouble breathing. Your heart is beating very fast. These symptoms may be an emergency. Get help right away. Call 911. Do not wait to see if the symptoms will go away. Do not drive yourself to the hospital. This information is not intended to replace advice given to you by your health care provider. Make sure you discuss any questions you have with your health care provider. Document Revised: 07/22/2022 Document Reviewed: 07/22/2022 Elsevier Patient Education  2024 [employer]  

## 2023-07-25 ENCOUNTER — Telehealth: Payer: Self-pay | Admitting: Family Medicine

## 2023-07-25 DIAGNOSIS — R059 Cough, unspecified: Secondary | ICD-10-CM

## 2023-07-25 LAB — COVID-19, FLU A+B AND RSV
Influenza A, NAA: NOT DETECTED
Influenza B, NAA: NOT DETECTED
RSV, NAA: NOT DETECTED
SARS-CoV-2, NAA: NOT DETECTED

## 2023-07-25 NOTE — Telephone Encounter (Signed)
Patient calling to get results for COVID test, aware that we do not have them back at this time.

## 2023-07-26 ENCOUNTER — Inpatient Hospital Stay: Payer: Medicare HMO

## 2023-07-26 ENCOUNTER — Inpatient Hospital Stay: Payer: Medicare HMO | Admitting: Hematology

## 2023-07-26 MED ORDER — GUAIFENESIN-CODEINE 100-10 MG/5ML PO SOLN
5.0000 mL | Freq: Three times a day (TID) | ORAL | 0 refills | Status: DC | PRN
Start: 2023-07-26 — End: 2024-10-29

## 2023-07-26 MED ORDER — BENZONATATE 100 MG PO CAPS
100.0000 mg | ORAL_CAPSULE | Freq: Three times a day (TID) | ORAL | 0 refills | Status: DC | PRN
Start: 2023-07-26 — End: 2023-07-31

## 2023-07-26 NOTE — Telephone Encounter (Signed)
Pt called in asking about results - results not reviewed yet but did advise her she was negative for all 3  - pt states she is still very congested and has a cough still wanted to see if she could ger a refill n her cough medication

## 2023-07-28 ENCOUNTER — Emergency Department (HOSPITAL_COMMUNITY): Payer: Medicare HMO

## 2023-07-28 ENCOUNTER — Encounter (HOSPITAL_COMMUNITY): Payer: Self-pay | Admitting: *Deleted

## 2023-07-28 ENCOUNTER — Telehealth: Payer: Self-pay | Admitting: Family Medicine

## 2023-07-28 ENCOUNTER — Other Ambulatory Visit: Payer: Self-pay

## 2023-07-28 ENCOUNTER — Inpatient Hospital Stay (HOSPITAL_COMMUNITY)
Admission: EM | Admit: 2023-07-28 | Discharge: 2023-07-31 | DRG: 641 | Disposition: A | Discharge status: Home Health | Payer: Medicare HMO | Attending: Family Medicine | Admitting: Family Medicine

## 2023-07-28 DIAGNOSIS — N189 Chronic kidney disease, unspecified: Secondary | ICD-10-CM | POA: Diagnosis not present

## 2023-07-28 DIAGNOSIS — I129 Hypertensive chronic kidney disease with stage 1 through stage 4 chronic kidney disease, or unspecified chronic kidney disease: Secondary | ICD-10-CM | POA: Diagnosis not present

## 2023-07-28 DIAGNOSIS — E782 Mixed hyperlipidemia: Secondary | ICD-10-CM | POA: Diagnosis not present

## 2023-07-28 DIAGNOSIS — N1832 Chronic kidney disease, stage 3b: Secondary | ICD-10-CM | POA: Diagnosis present

## 2023-07-28 DIAGNOSIS — N1831 Chronic kidney disease, stage 3a: Secondary | ICD-10-CM | POA: Diagnosis not present

## 2023-07-28 DIAGNOSIS — W19XXXA Unspecified fall, initial encounter: Secondary | ICD-10-CM | POA: Diagnosis not present

## 2023-07-28 DIAGNOSIS — I4891 Unspecified atrial fibrillation: Secondary | ICD-10-CM | POA: Diagnosis present

## 2023-07-28 DIAGNOSIS — E860 Dehydration: Secondary | ICD-10-CM | POA: Diagnosis not present

## 2023-07-28 DIAGNOSIS — Z800 Family history of malignant neoplasm of digestive organs: Secondary | ICD-10-CM | POA: Diagnosis not present

## 2023-07-28 DIAGNOSIS — Z8049 Family history of malignant neoplasm of other genital organs: Secondary | ICD-10-CM | POA: Diagnosis not present

## 2023-07-28 DIAGNOSIS — I480 Paroxysmal atrial fibrillation: Secondary | ICD-10-CM | POA: Diagnosis present

## 2023-07-28 DIAGNOSIS — Z9841 Cataract extraction status, right eye: Secondary | ICD-10-CM

## 2023-07-28 DIAGNOSIS — Z66 Do not resuscitate: Secondary | ICD-10-CM | POA: Diagnosis present

## 2023-07-28 DIAGNOSIS — I959 Hypotension, unspecified: Secondary | ICD-10-CM | POA: Diagnosis not present

## 2023-07-28 DIAGNOSIS — E871 Hypo-osmolality and hyponatremia: Secondary | ICD-10-CM | POA: Diagnosis not present

## 2023-07-28 DIAGNOSIS — Z7983 Long term (current) use of bisphosphonates: Secondary | ICD-10-CM

## 2023-07-28 DIAGNOSIS — I10 Essential (primary) hypertension: Secondary | ICD-10-CM | POA: Diagnosis present

## 2023-07-28 DIAGNOSIS — Z888 Allergy status to other drugs, medicaments and biological substances status: Secondary | ICD-10-CM

## 2023-07-28 DIAGNOSIS — Z85038 Personal history of other malignant neoplasm of large intestine: Secondary | ICD-10-CM | POA: Diagnosis not present

## 2023-07-28 DIAGNOSIS — Z9842 Cataract extraction status, left eye: Secondary | ICD-10-CM

## 2023-07-28 DIAGNOSIS — E878 Other disorders of electrolyte and fluid balance, not elsewhere classified: Secondary | ICD-10-CM | POA: Diagnosis present

## 2023-07-28 DIAGNOSIS — R531 Weakness: Secondary | ICD-10-CM

## 2023-07-28 DIAGNOSIS — N179 Acute kidney failure, unspecified: Secondary | ICD-10-CM | POA: Diagnosis not present

## 2023-07-28 DIAGNOSIS — Z8673 Personal history of transient ischemic attack (TIA), and cerebral infarction without residual deficits: Secondary | ICD-10-CM

## 2023-07-28 DIAGNOSIS — R059 Cough, unspecified: Secondary | ICD-10-CM | POA: Diagnosis not present

## 2023-07-28 DIAGNOSIS — Z79899 Other long term (current) drug therapy: Secondary | ICD-10-CM

## 2023-07-28 DIAGNOSIS — Z823 Family history of stroke: Secondary | ICD-10-CM

## 2023-07-28 DIAGNOSIS — R5381 Other malaise: Secondary | ICD-10-CM | POA: Diagnosis present

## 2023-07-28 DIAGNOSIS — Z9049 Acquired absence of other specified parts of digestive tract: Secondary | ICD-10-CM | POA: Diagnosis not present

## 2023-07-28 DIAGNOSIS — Z806 Family history of leukemia: Secondary | ICD-10-CM

## 2023-07-28 DIAGNOSIS — Z833 Family history of diabetes mellitus: Secondary | ICD-10-CM

## 2023-07-28 DIAGNOSIS — N183 Chronic kidney disease, stage 3 unspecified: Secondary | ICD-10-CM | POA: Diagnosis not present

## 2023-07-28 DIAGNOSIS — Z8249 Family history of ischemic heart disease and other diseases of the circulatory system: Secondary | ICD-10-CM

## 2023-07-28 DIAGNOSIS — Z801 Family history of malignant neoplasm of trachea, bronchus and lung: Secondary | ICD-10-CM | POA: Diagnosis not present

## 2023-07-28 DIAGNOSIS — Z7901 Long term (current) use of anticoagulants: Secondary | ICD-10-CM

## 2023-07-28 DIAGNOSIS — Z961 Presence of intraocular lens: Secondary | ICD-10-CM | POA: Diagnosis present

## 2023-07-28 DIAGNOSIS — R8290 Unspecified abnormal findings in urine: Secondary | ICD-10-CM | POA: Diagnosis present

## 2023-07-28 DIAGNOSIS — Z1152 Encounter for screening for COVID-19: Secondary | ICD-10-CM

## 2023-07-28 HISTORY — DX: Disorder of kidney and ureter, unspecified: N28.9

## 2023-07-28 LAB — BASIC METABOLIC PANEL
Anion gap: 12 (ref 5–15)
Anion gap: 10 (ref 5–15)
Anion gap: 9 (ref 5–15)
BUN: 34 mg/dL — ABNORMAL HIGH (ref 8–23)
BUN: 30 mg/dL — ABNORMAL HIGH (ref 8–23)
BUN: 26 mg/dL — ABNORMAL HIGH (ref 8–23)
CO2: 22 mmol/L (ref 22–32)
CO2: 24 mmol/L (ref 22–32)
CO2: 25 mmol/L (ref 22–32)
Calcium: 9.5 mg/dL (ref 8.9–10.3)
Calcium: 9.1 mg/dL (ref 8.9–10.3)
Calcium: 9.1 mg/dL (ref 8.9–10.3)
Chloride: 80 mmol/L — ABNORMAL LOW (ref 98–111)
Chloride: 83 mmol/L — ABNORMAL LOW (ref 98–111)
Chloride: 87 mmol/L — ABNORMAL LOW (ref 98–111)
Creatinine, Ser: 1.72 mg/dL — ABNORMAL HIGH (ref 0.44–1.00)
Creatinine, Ser: 1.56 mg/dL — ABNORMAL HIGH (ref 0.44–1.00)
Creatinine, Ser: 1.38 mg/dL — ABNORMAL HIGH (ref 0.44–1.00)
GFR, Estimated: 30 mL/min — ABNORMAL LOW (ref >60)
GFR, Estimated: 34 mL/min — ABNORMAL LOW (ref >60)
GFR, Estimated: 40 mL/min — ABNORMAL LOW (ref >60)
Glucose, Bld: 149 mg/dL — ABNORMAL HIGH (ref 70–99)
Glucose, Bld: 143 mg/dL — ABNORMAL HIGH (ref 70–99)
Glucose, Bld: 119 mg/dL — ABNORMAL HIGH (ref 70–99)
Potassium: 4.9 mmol/L (ref 3.5–5.1)
Potassium: 5.1 mmol/L (ref 3.5–5.1)
Potassium: 5.1 mmol/L (ref 3.5–5.1)
Sodium: 114 mmol/L — CL (ref 135–145)
Sodium: 117 mmol/L — CL (ref 135–145)
Sodium: 121 mmol/L — ABNORMAL LOW (ref 135–145)

## 2023-07-28 LAB — URINALYSIS, ROUTINE W REFLEX MICROSCOPIC
Bilirubin Urine: NEGATIVE
Glucose, UA: NEGATIVE mg/dL
Ketones, ur: NEGATIVE mg/dL
Nitrite: NEGATIVE
Protein, ur: 30 mg/dL — AB
RBC / HPF: >50 RBC/hpf (ref 0–5)
Specific Gravity, Urine: 1.006 (ref 1.005–1.030)
WBC, UA: >50 WBC/hpf (ref 0–5)
pH: 7 (ref 5.0–8.0)

## 2023-07-28 LAB — CBC
HCT: 31.9 % — ABNORMAL LOW (ref 36.0–46.0)
Hemoglobin: 11.2 g/dL — ABNORMAL LOW (ref 12.0–15.0)
MCH: 30.9 pg (ref 26.0–34.0)
MCHC: 35.1 g/dL (ref 30.0–36.0)
MCV: 87.9 fL (ref 80.0–100.0)
Platelets: 179 10*3/uL (ref 150–400)
RBC: 3.63 MIL/uL — ABNORMAL LOW (ref 3.87–5.11)
RDW: 12.5 % (ref 11.5–15.5)
WBC: 8 10*3/uL (ref 4.0–10.5)
nRBC: 0 % (ref 0.0–0.2)

## 2023-07-28 LAB — SARS CORONAVIRUS 2 BY RT PCR: SARS Coronavirus 2 by RT PCR: NEGATIVE

## 2023-07-28 LAB — LACTIC ACID, PLASMA
Lactic Acid, Venous: 1.8 mmol/L (ref 0.5–1.9)
Lactic Acid, Venous: 1.5 mmol/L (ref 0.5–1.9)

## 2023-07-28 LAB — MRSA NEXT GEN BY PCR, NASAL: MRSA by PCR Next Gen: DETECTED — AB

## 2023-07-28 MED ORDER — ACETAMINOPHEN 650 MG RE SUPP: 650.0000 mg | Freq: Four times a day (QID) | RECTAL | Status: DC | PRN

## 2023-07-28 MED ORDER — SODIUM CHLORIDE 0.9 % IV BOLUS
500.0000 mL | Freq: Once | INTRAVENOUS | Status: AC
  Administered 2023-07-28: 500 mL via INTRAVENOUS

## 2023-07-28 MED ORDER — ONDANSETRON HCL 4 MG/2ML IJ SOLN: 4.0000 mg | Freq: Four times a day (QID) | INTRAMUSCULAR | Status: DC | PRN

## 2023-07-28 MED ORDER — MUPIROCIN 2 % EX OINT
1.0000 | TOPICAL_OINTMENT | Freq: Two times a day (BID) | CUTANEOUS | Status: DC
  Administered 2023-07-28 – 2023-07-31 (×6): 1 via NASAL
  Filled 2023-07-28 (×2): qty 22

## 2023-07-28 MED ORDER — HEPARIN SODIUM (PORCINE) 5000 UNIT/ML IJ SOLN: 5000.0000 [IU] | Freq: Three times a day (TID) | INTRAMUSCULAR | Status: DC

## 2023-07-28 MED ORDER — LORATADINE 10 MG PO TABS
10.0000 mg | ORAL_TABLET | Freq: Every day | ORAL | Status: DC
  Administered 2023-07-29 – 2023-07-31 (×3): 10 mg via ORAL
  Filled 2023-07-28 (×3): qty 1

## 2023-07-28 MED ORDER — ONDANSETRON HCL 4 MG PO TABS: 4.0000 mg | ORAL_TABLET | Freq: Four times a day (QID) | ORAL | Status: DC | PRN

## 2023-07-28 MED ORDER — ATORVASTATIN CALCIUM 20 MG PO TABS
20.0000 mg | ORAL_TABLET | Freq: Every day | ORAL | Status: DC
  Administered 2023-07-29 – 2023-07-31 (×3): 20 mg via ORAL
  Filled 2023-07-28: qty 1
  Filled 2023-07-28: qty 2
  Filled 2023-07-28: qty 1

## 2023-07-28 MED ORDER — DORZOLAMIDE HCL-TIMOLOL MAL 2-0.5 % OP SOLN
1.0000 [drp] | Freq: Two times a day (BID) | OPHTHALMIC | Status: DC
  Administered 2023-07-28 – 2023-07-31 (×6): 1 [drp] via OPHTHALMIC
  Filled 2023-07-28 (×2): qty 10

## 2023-07-28 MED ORDER — APIXABAN 5 MG PO TABS
5.0000 mg | ORAL_TABLET | Freq: Two times a day (BID) | ORAL | Status: DC
  Administered 2023-07-28 – 2023-07-31 (×6): 5 mg via ORAL
  Filled 2023-07-28 (×6): qty 1

## 2023-07-28 MED ORDER — SODIUM CHLORIDE 0.9 % IV SOLN: INTRAVENOUS | Status: DC

## 2023-07-28 MED ORDER — BENZONATATE 100 MG PO CAPS: 100.0000 mg | ORAL_CAPSULE | Freq: Three times a day (TID) | ORAL | Status: DC | PRN

## 2023-07-28 MED ORDER — ACETAMINOPHEN 325 MG PO TABS
650.0000 mg | ORAL_TABLET | Freq: Four times a day (QID) | ORAL | Status: DC | PRN
  Administered 2023-07-29: 650 mg via ORAL
  Filled 2023-07-28: qty 2

## 2023-07-28 MED ORDER — AMLODIPINE BESYLATE 5 MG PO TABS
10.0000 mg | ORAL_TABLET | Freq: Every day | ORAL | Status: DC
  Administered 2023-07-28 – 2023-07-31 (×4): 10 mg via ORAL
  Filled 2023-07-28 (×4): qty 2

## 2023-07-28 MED ORDER — CHLORHEXIDINE GLUCONATE CLOTH 2 % EX PADS
6.0000 | MEDICATED_PAD | Freq: Every day | CUTANEOUS | Status: DC
  Administered 2023-07-29: 6 via TOPICAL

## 2023-07-28 NOTE — Assessment & Plan Note (Signed)
Continue statin. 

## 2023-07-28 NOTE — Assessment & Plan Note (Signed)
-  Patient with a stage IIIb at baseline -Presented with dehydration with hyponatremia and acute kidney injury -Minimize nephrotoxic agents, avoid hypotension, avoid the use of contrast and follow renal function trend -Will provide fluid resuscitation.

## 2023-07-28 NOTE — ED Notes (Addendum)
Per EMS , pt fell yesterday and has been getting weak since. Pt arrived a/o. Nad. No obvious weakness noted. Color wnl. Cbg in route 150.pt states " I have been weak all week with a cough Monday. I seen my doctor and she gave me codeine and my cough went away but I'm still weak, that's why I fell last night". Pt denies pain from fall. Larey Seat out of chair getting up to go to bathroom. No obvious deformities noted.

## 2023-07-28 NOTE — Assessment & Plan Note (Signed)
-  Rate controlled -Holding Eliquis in the setting of acute kidney injury -Follow clinical response for resumption -Continue telemetry monitoring -Follow electrolytes to maintain potassium above 4 and magnesium above 2 -Continue Norvasc.

## 2023-07-28 NOTE — ED Triage Notes (Signed)
Pt brought in by [facility] from home with c/o cough, weakness, decreased appetite x 1 week. Denies fever.

## 2023-07-28 NOTE — Telephone Encounter (Signed)
Pt has been notified - she is currently at hospital

## 2023-07-28 NOTE — H&P (Signed)
History and Physical    Patient: Gabrielle [name] GMW:[id number] DOB: 1946/01/19 DOA: 07/28/2023 DOS: the patient was seen and examined on 07/28/2023 PCP: [name], DO  Patient coming from: Home  Chief Complaint:  Chief Complaint  Patient presents with   Weakness   HPI: Gabrielle [name] is a 77 y.o. female with medical history significant of hypertension, hyperlipidemia, paroxysmal atrial fibrillation and chronic kidney disease stage IIIb; presented to the hospital secondary to weakness and not feeling well.  Patient reports approximately 4 5 days she had had some episodes of URI symptoms for which she received codeine medicine and antihistamines.  Symptoms improved/stabilized with those medications but experienced decreased oral intake and on the day of admission severe weak to even get out of bed.  Patient reports some nausea and pulse to see if emesis earlier in the week; improving/resolving now.   Patient denies fever, dysuria, abdominal pain, chest pain, seek contacts, focal weaknesses or any other complaints.  In the ED workup demonstrated negative COVID test, chest x-ray without acute cardiopulmonary process.  And blood work demonstrating acute kidney injury on chronic renal failure with significant hyponatremia.  TRH has been called to place patient in the hospital for further evaluation and management.   Review of Systems: As mentioned in the history of present illness. All other systems reviewed and are negative. Past Medical History:  Diagnosis Date   Arthritis    Cancer [facility])    Cataract    OU   Colon cancer (HCC)    Family history of pancreatic cancer 08/21/2020   Family history of uterine cancer 08/21/2020   H/O cesarean section    Hx of tonsillectomy    Hyperlipidemia    Hypertension    Kidney disease    Macular degeneration    Exu [facility] OU   Paroxysmal atrial fibrillation (HCC) 07/05/2020   Stroke (cerebrum) (HCC) 07/05/2020   Urinary retention  04/15/2020   Past Surgical History:  Procedure Laterality Date   BIOPSY  04/06/2020   Procedure: BIOPSY;  Surgeon: Malissa Hippo, MD;  [facility];  Service: Endoscopy;;   CARPAL TUNNEL RELEASE Right    CATARACT EXTRACTION W/PHACO Left 10/11/2019   Procedure: CATARACT EXTRACTION PHACO AND INTRAOCULAR LENS PLACEMENT (IOC);  Surgeon: Fabio Pierce, MD;  Location: [facility];  Service: Ophthalmology;  Laterality: Left;  CDE: 8.56   CATARACT EXTRACTION W/PHACO Right 10/25/2019   Procedure: CATARACT EXTRACTION PHACO AND INTRAOCULAR LENS PLACEMENT (IOC);  Surgeon: Fabio Pierce, MD;  Location: [facility];  Service: Ophthalmology;  Laterality: Right;  CDE: 5.67   CESAREAN SECTION     COLONOSCOPY N/A 04/06/2020   Procedure: COLONOSCOPY;  Surgeon: Malissa Hippo, MD;  [facility]: [facility];  Service: Endoscopy;  Laterality: N/A;   CYSTOSCOPY WITH BIOPSY N/A 04/08/2020   Procedure: CYSTOSCOPY WITH BIOPSY;  Surgeon: Malen Gauze, MD;  Location: [facility];  Service: Urology;  Laterality: N/A;   ESOPHAGOGASTRODUODENOSCOPY N/A 04/05/2020   Procedure: ESOPHAGOGASTRODUODENOSCOPY (EGD);  Surgeon: Malissa Hippo, MD;  [facility]: [facility];  Service: Endoscopy;  Laterality: N/A;   PARTIAL COLECTOMY N/A 04/08/2020   Procedure: PARTIAL COLECTOMY;  Surgeon: Lucretia Roers, MD;  Location: [facility];  Service: General;  Laterality: N/A;   PORTACATH PLACEMENT Left 05/18/2020   Procedure: INSERTION PORT-A-CATH (ATTACHED CATHETER IN LEFT SUBCLAVIAN);  Surgeon: Lucretia Roers, MD;  Location: [facility];  Service: General;  Laterality: Left;   TONSILLECTOMY     Social History:  reports that she has never smoked. She has never used smokeless tobacco. She reports that she does not drink alcohol and does not use drugs.  Allergies  Allergen Reactions   Lisinopril Cough    Family History  Problem Relation Age of Onset   Macular degeneration Mother    Stroke Mother    Hypertension Mother    Dementia  Mother    Lung cancer Father        dx late 2s; smoking hx   Diabetes Sister    Hypertension Sister    Leukemia Paternal Uncle        d. 84s   Cancer Maternal Aunt        ovarian or endometrial dx 54s   Pancreatic cancer Paternal Uncle        d. late 27s    Prior to Admission medications   Medication Sig Start Date End Date Taking? Authorizing Provider  alendronate (FOSAMAX) 70 MG tablet TAKE ONE TABLET EVERY 7 DAYS ON AN EMPTY STOMACH WITH A FULL GLASS OF WATER 03/08/23   [name], DO  amLODipine (NORVASC) 10 MG tablet Take 1 tablet (10 mg total) by mouth daily. For blood pressure 04/24/23   [name], DO  atorvastatin (LIPITOR) 20 MG tablet Take 1 tablet (20 mg total) by mouth daily. For cholesterol 04/24/23   [name], DO  benzonatate (TESSALON) 100 MG capsule Take 1 capsule (100 mg total) by mouth 3 (three) times daily as needed for cough. 07/26/23   [name], DO  cholecalciferol (VITAMIN D3) 25 MCG (1000 UT) tablet Take 1,000 Units by mouth daily.    [provider]  CRANBERRY FRUIT PO Take 1 tablet by mouth daily.    [provider]  desloratadine (CLARINEX) 5 MG tablet Take 1 tablet (5 mg total) by mouth daily. For allergies/ drainage 06/26/23   [name], DO  diclofenac Sodium (VOLTAREN) 1 % GEL APPLY 4 GRAMS TO AFFECTED AREA(S) 4 TIMES A DAY 04/12/21   Gottschalk, Ashly M, DO  dorzolamide-timolol (COSOPT) 2-0.5 % ophthalmic solution INSTILL ONE DROP IN [facility] EYE TWICE DAILY 10/14/22   [name], MD  dorzolamide-timolol (COSOPT) 2-0.5 % ophthalmic solution Place 1 drop into the right eye 2 (two) times daily. 02/17/23 02/17/24  [name], MD  ELIQUIS 5 MG TABS tablet Take 1 tablet (5 mg total) by mouth 2 (two) times daily. 04/24/23   [name], DO  Ferrous Sulfate (IRON PO) Take by mouth.    [provider]  fluticasone (FLONASE) 50 MCG/ACT nasal spray Place 2 sprays into both nostrils daily.  04/18/23   Daphine Deutscher, Mary-Margaret, FNP  guaiFENesin-codeine 100-10 MG/5ML syrup Take 5 mLs by mouth 3 (three) times daily as needed for cough. 07/26/23   [name], DO  hydrALAZINE (APRESOLINE) 25 MG tablet Take 1 tablet (25 mg total) by mouth 2 (two) times daily. 04/24/23   [name], DO  losartan (COZAAR) 100 MG tablet Take 100 mg by mouth daily. 06/01/23   [provider]  losartan-hydrochlorothiazide (HYZAAR) 100-25 MG tablet Take 1 tablet by mouth daily. 04/24/23   [name], DO  potassium chloride SA (KLOR-CON M) 20 MEQ tablet TAKE 1 TABLET 3 TIMES A DAY Patient not taking: Reported on 07/28/2023 07/03/23   Doreatha Massed, MD  spironolactone (ALDACTONE) 25 MG tablet Take 1 tablet (25 mg total) by mouth daily. 04/24/23   [name], DO    Physical Exam: Vitals:  07/28/23 1400 07/28/23 1431 07/28/23 1500 07/28/23 1600  BP: (!) 135/51 (!) 146/47 (!) 143/44 (!) 127/45  Pulse: 83 83 76 75  Resp: 18 (!) 26 13 14   Temp:  99.2 F (37.3 C)    TempSrc:  Oral    SpO2: 95% 100% 100% 98%  Weight:  82.5 kg    Height:  5\' 2"  (1.575 m)     General exam: Alert, awake, oriented x 3; afebrile, no chest pain, generally weak and deconditioned.  Chronically ill in appearance.  No shortness of breath. Respiratory system: Positive rhonchi; no wheezing, no crackles, no using accessory muscles. Cardiovascular system: Rate controlled, no rubs, no gallops, no JVD. Gastrointestinal system: Abdomen is nondistended, soft and nontender. No organomegaly or masses felt. Normal bowel sounds heard. Central nervous system: No focal neurological deficits. Extremities: No cyanosis, clubbing or edema. Skin: No petechiae. Psychiatry: Judgement and insight appear normal. Mood & affect appropriate.   Data Reviewed: MRSA screening: Positive CBC: WBCs 8.0, hemoglobin 11.2, platelet count 179K COVID PCR: Negative Lactic acid: 1.8 UA: Hazy appearance, specific gravity 1.006,  large amount of hemoglobin urine dipstick, negative ketones, negative nitrite, large leukocyte esterase.   Assessment and Plan: * Hyponatremia -In the setting of decreased oral intake along with continued use of diuretics. -Diuretics has been whole -Will provide fluid resuscitation with close monitoring of patient's sodium level to minimize overcorrection -Encouraged to increase oral intake and will follow clinical response.  Atrial fibrillation (HCC) -Rate controlled -Holding Eliquis in the setting of acute kidney injury -Follow clinical response for resumption -Continue telemetry monitoring -Follow electrolytes to maintain potassium above 4 and magnesium above 2 -Continue Norvasc.  Acute kidney injury superimposed on chronic kidney disease (HCC) -Patient with a stage IIIb at baseline -Presented with dehydration with hyponatremia and acute kidney injury -Minimize nephrotoxic agents, avoid hypotension, avoid the use of contrast and follow renal function trend -Will provide fluid resuscitation.  Mixed hyperlipidemia -Continue statin.  Essential hypertension -Stable -Will continue Norvasc; holding hydralazine, Hyzaar and Aldactone -Maintain adequate hydration and follow vital signs -Heart healthy diet has been ordered.  MRSA PCR screening positive -Decolonization protocol initiated  Abnormal urinalysis -Patient denying dysuria -Urine culture has been ordered -Provide fluid resuscitation and supportive care; depending on culture results we will decide the need for antibiotic therapy.    Advance Care Planning:   Code Status: DNR   Consults: None  Family Communication: No family at bedside.  Severity of Illness: The appropriate patient status for this patient is INPATIENT. Inpatient status is judged to be reasonable and necessary in order to provide the required intensity of service to ensure the patient's safety. The patient's presenting symptoms, physical exam findings,  and initial radiographic and laboratory data in the context of their chronic comorbidities is felt to place them at high risk for further clinical deterioration. Furthermore, it is not anticipated that the patient will be medically stable for discharge from the hospital within 2 midnights of admission.   * I certify that at the point of admission it is my clinical judgment that the patient will require inpatient hospital care spanning beyond 2 midnights from the point of admission due to high intensity of service, high risk for further deterioration and high frequency of surveillance required.*  Author: Vassie Loll, MD 07/28/2023 5:22 PM  For on call review [email or web address].

## 2023-07-28 NOTE — ED Provider Notes (Signed)
[place] EMERGENCY DEPARTMENT AT [facility] Provider Note   CSN: [id number] Arrival date & time: 07/28/23  1102     History  Chief Complaint  Patient presents with   Weakness    Gabrielle [name] is a 77 y.o. female.  Patient is here for general weakness.  She has been sick for over a week with a cough.  She saw her primary care doctor on Monday and was given a codeine cough medicine.  She said her cough is improved but her general weakness has not.  She fell last night trying to get to the bathroom and could not get up.  She was unable to get up out of bed again today and the ambulance was called.  She denies any injury from the fall.  She has had not had much of an appetite.  She denies any chest pain abdominal pain.  She had some dry heaves earlier.  No urinary symptoms  The history is provided by the patient and the EMS personnel.  Weakness Severity:  Severe Onset quality:  Gradual Timing:  Constant Progression:  Worsening Chronicity:  New Context: recent infection   Relieved by:  Nothing Worsened by:  Activity Ineffective treatments:  Rest Associated symptoms: cough, difficulty walking, falls, nausea and vomiting   Associated symptoms: no abdominal pain and no chest pain        Home Medications Prior to Admission medications   Medication Sig Start Date End Date Taking? Authorizing Provider  alendronate (FOSAMAX) 70 MG tablet TAKE ONE TABLET EVERY 7 DAYS ON AN EMPTY STOMACH WITH A FULL GLASS OF WATER 03/08/23   [name], DO  amLODipine (NORVASC) 10 MG tablet Take 1 tablet (10 mg total) by mouth daily. For blood pressure 04/24/23   [name], DO  atorvastatin (LIPITOR) 20 MG tablet Take 1 tablet (20 mg total) by mouth daily. For cholesterol 04/24/23   [name], DO  benzonatate (TESSALON) 100 MG capsule Take 1 capsule (100 mg total) by mouth 3 (three) times daily as needed for cough. 07/26/23   [name], DO  cholecalciferol  (VITAMIN D3) 25 MCG (1000 UT) tablet Take 1,000 Units by mouth daily.    [provider]  CRANBERRY FRUIT PO Take 1 tablet by mouth daily.    [provider]  desloratadine (CLARINEX) 5 MG tablet Take 1 tablet (5 mg total) by mouth daily. For allergies/ drainage 06/26/23   [name], DO  diclofenac Sodium (VOLTAREN) 1 % GEL APPLY 4 GRAMS TO AFFECTED AREA(S) 4 TIMES A DAY 04/12/21   Gottschalk, Ashly M, DO  dorzolamide-timolol (COSOPT) 2-0.5 % ophthalmic solution INSTILL ONE DROP IN [facility] EYE TWICE DAILY 10/14/22   [name], MD  dorzolamide-timolol (COSOPT) 2-0.5 % ophthalmic solution Place 1 drop into the right eye 2 (two) times daily. 02/17/23 02/17/24  [name], MD  ELIQUIS 5 MG TABS tablet Take 1 tablet (5 mg total) by mouth 2 (two) times daily. 04/24/23   [name], DO  fluticasone (FLONASE) 50 MCG/ACT nasal spray Place 2 sprays into both nostrils daily. 04/18/23   Daphine Deutscher, Mary-Margaret, FNP  guaiFENesin-codeine 100-10 MG/5ML syrup Take 5 mLs by mouth 3 (three) times daily as needed for cough. 07/26/23   [name], DO  hydrALAZINE (APRESOLINE) 25 MG tablet Take 1 tablet (25 mg total) by mouth 2 (two) times daily. 04/24/23   [name], DO  losartan-hydrochlorothiazide (HYZAAR) 100-25 MG tablet Take 1 tablet by  mouth daily. 04/24/23   [name], DO  potassium chloride SA (KLOR-CON M) 20 MEQ tablet TAKE 1 TABLET 3 TIMES A DAY 07/03/23   Doreatha Massed, MD  spironolactone (ALDACTONE) 25 MG tablet Take 1 tablet (25 mg total) by mouth daily. 04/24/23   [name], DO      Allergies    Lisinopril    Review of Systems   Review of Systems  Respiratory:  Positive for cough.   Cardiovascular:  Negative for chest pain.  Gastrointestinal:  Positive for nausea and vomiting. Negative for abdominal pain.  Musculoskeletal:  Positive for falls and gait problem.  Neurological:  Positive for weakness.    Physical Exam Updated  Vital Signs BP (!) 146/49 (BP Location: Right Arm)   Pulse 78   Temp 97.7 F (36.5 C) (Oral)   Resp 16   Ht 5\' 2"  (1.575 m)   Wt 79.4 kg   SpO2 96%   BMI 32.01 kg/m  Physical Exam Vitals and nursing note reviewed.  Constitutional:      General: She is not in acute distress.    Appearance: Normal appearance. She is well-developed.  HENT:     Head: Normocephalic and atraumatic.  Eyes:     Conjunctiva/sclera: Conjunctivae normal.  Cardiovascular:     Rate and Rhythm: Normal rate and regular rhythm.     Heart sounds: No murmur heard. Pulmonary:     Effort: Pulmonary effort is normal. No respiratory distress.     Breath sounds: Normal breath sounds.  Abdominal:     Palpations: Abdomen is soft.     Tenderness: There is no abdominal tenderness. There is no guarding or rebound.  Musculoskeletal:        General: No swelling.     Cervical back: Neck supple.  Skin:    General: Skin is warm and dry.     Capillary Refill: Capillary refill takes less than 2 seconds.  Neurological:     General: No focal deficit present.     Mental Status: She is alert and oriented to person, place, and time.     Sensory: No sensory deficit.     Motor: No weakness.     ED Results / Procedures / Treatments   Labs (all labs ordered are listed, but only abnormal results are displayed) Labs Reviewed  MRSA NEXT GEN BY PCR, NASAL - Abnormal; Notable for the following components:      Result Value   MRSA by PCR Next Gen DETECTED (*)    All other components within normal limits  BASIC METABOLIC PANEL - Abnormal; Notable for the following components:   Sodium 114 (*)    Chloride 80 (*)    Glucose, Bld 149 (*)    BUN 34 (*)    Creatinine, Ser 1.72 (*)    GFR, Estimated 30 (*)    All other components within normal limits  CBC - Abnormal; Notable for the following components:   RBC 3.63 (*)    Hemoglobin 11.2 (*)    HCT 31.9 (*)    All other components within normal limits  URINALYSIS, ROUTINE W  REFLEX MICROSCOPIC - Abnormal; Notable for the following components:   APPearance HAZY (*)    Hgb urine dipstick LARGE (*)    Protein, ur 30 (*)    Leukocytes,Ua LARGE (*)    Bacteria, UA RARE (*)    All other components within normal limits  CULTURE, BLOOD (ROUTINE X 2)  CULTURE, BLOOD (ROUTINE X  2)  SARS CORONAVIRUS 2 BY RT PCR  URINE CULTURE  LACTIC ACID, PLASMA  LACTIC ACID, PLASMA  BASIC METABOLIC PANEL  BASIC METABOLIC PANEL  BASIC METABOLIC PANEL    EKG EKG Interpretation Date/Time:  Friday July 28 2023 11:39:05 EDT Ventricular Rate:  75 PR Interval:  196 QRS Duration:  99 QT Interval:  358 QTC Calculation: 400 R Axis:   -8  Text Interpretation: Sinus rhythm Left ventricular hypertrophy No significant change since prior 9/23 Confirmed by Meridee Score [phone]) on 07/28/2023 12:02:48 PM  Radiology DG Chest 2 View  Result Date: 07/28/2023 CLINICAL DATA:  Weakness EXAM: CHEST - 2 VIEW COMPARISON:  07/04/2020 FINDINGS: Left-sided Port-A-Cath with the tip unchanged in position. No focal consolidation. No pleural effusion or pneumothorax. Heart and mediastinal contours are unremarkable. No acute osseous abnormality. IMPRESSION: No active cardiopulmonary disease. Electronically Signed   By: Elige Ko M.D.   On: 07/28/2023 12:43    Procedures Procedures    Medications Ordered in ED Medications  0.9 %  sodium chloride infusion ( Intravenous New Bag/Given 07/28/23 1417)  atorvastatin (LIPITOR) tablet 20 mg (has no administration in time range)  amLODipine (NORVASC) tablet 10 mg (10 mg Oral Given 07/28/23 1624)  benzonatate (TESSALON) capsule 100 mg (has no administration in time range)  dorzolamide-timolol (COSOPT) 2-0.5 % ophthalmic solution 1 drop (has no administration in time range)  loratadine (CLARITIN) tablet 10 mg (has no administration in time range)  apixaban (ELIQUIS) tablet 5 mg (5 mg Oral Given 07/28/23 1624)  acetaminophen (TYLENOL) tablet 650 mg (has no  administration in time range)    Or  acetaminophen (TYLENOL) suppository 650 mg (has no administration in time range)  ondansetron (ZOFRAN) tablet 4 mg (has no administration in time range)    Or  ondansetron (ZOFRAN) injection 4 mg (has no administration in time range)  mupirocin ointment (BACTROBAN) 2 % 1 Application (has no administration in time range)  Chlorhexidine Gluconate Cloth 2 % PADS 6 each (has no administration in time range)  sodium chloride 0.9 % bolus 500 mL (0 mLs Intravenous Stopped 07/28/23 1338)    ED Course/ Medical Decision Making/ A&P Clinical Course as of 07/28/23 1727  Fri Jul 28, 2023  1214 Chest x-ray interpreted by me as no clear infiltrate.  Awaiting radiology reading. [MB]  1240 Patient with critically low sodium of 114.  Her baseline looks about 127 so chronically low.  Creatinine also elevated. [MB]  1331 I updated the patient on her results and the need for admission.  She is in agreement with plan.  Discussed with [facility] hospitalist Dr. Gwenlyn Perking who will evaluate her for admission. [MB]    Clinical Course User Index [MB] Terrilee Files, MD                                 Medical Decision Making Amount and/or Complexity of Data Reviewed Labs: ordered. Radiology: ordered.  Risk Decision regarding hospitalization.   This patient complains of general weakness recent cough; this involves an extensive number of treatment Options and is a complaint that carries with it a high risk of complications and morbidity. The differential includes, metabolic derangement, dehydration, sepsis, Sirs  I ordered, reviewed and interpreted labs, which included CBC with normal white count slightly lower hemoglobin than baseline, chemistries with critically low sodium elevated BUN and creatinine, urinalysis possible signs of infection sent for culture, lactate normal, blood culture sent, COVID-negative  I ordered medication IV fluids and reviewed PMP when indicated. I  ordered imaging studies which included chest x-ray and I independently    visualized and interpreted imaging which showed no acute findings Additional history obtained from EMS Previous records obtained and reviewed in epic including recent oncology notes I consulted Dr. Gwenlyn Perking [facility] hospital[facility] and discussed lab and imaging findings and discussed disposition.  Cardiac monitoring reviewed, sinus rhythm Social determinants considered, no significant barriers Critical Interventions: None  After the interventions stated above, I reevaluated the patient and found patient to be well-appearing with clear sensorium Admission and further testing considered, she would benefit from mission to the hospital for continued IV hydration and monitoring of her sodium level.  Patient in agreement with plan for admission.         Final Clinical Impression(s) / ED Diagnoses Final diagnoses:  Hyponatremia  Generalized weakness    Rx / DC Orders ED Discharge Orders     None         Terrilee Files, MD 07/28/23 1730

## 2023-07-28 NOTE — Assessment & Plan Note (Signed)
-  In the setting of decreased oral intake along with continued use of diuretics. -Diuretics has been whole -Will provide fluid resuscitation with close monitoring of patient's sodium level to minimize overcorrection -Encouraged to increase oral intake and will follow clinical response.

## 2023-07-28 NOTE — Telephone Encounter (Signed)
Stop taking codeine cough med and just use the nondrowsy pills.  She should only be using the syrup at bedtime and the pills during the day but if fell don't want her using syrup at all.

## 2023-07-28 NOTE — Assessment & Plan Note (Signed)
-  Stable -Will continue Norvasc; holding hydralazine, Hyzaar and Aldactone -Maintain adequate hydration and follow vital signs -Heart healthy diet has been ordered.

## 2023-07-29 DIAGNOSIS — E871 Hypo-osmolality and hyponatremia: Secondary | ICD-10-CM

## 2023-07-29 LAB — URINE CULTURE: Culture: NO GROWTH

## 2023-07-29 LAB — BASIC METABOLIC PANEL
Anion gap: 9 (ref 5–15)
BUN: 25 mg/dL — ABNORMAL HIGH (ref 8–23)
CO2: 24 mmol/L (ref 22–32)
Calcium: 9 mg/dL (ref 8.9–10.3)
Chloride: 89 mmol/L — ABNORMAL LOW (ref 98–111)
Creatinine, Ser: 1.3 mg/dL — ABNORMAL HIGH (ref 0.44–1.00)
GFR, Estimated: 43 mL/min — ABNORMAL LOW (ref >60)
Glucose, Bld: 130 mg/dL — ABNORMAL HIGH (ref 70–99)
Potassium: 4.7 mmol/L (ref 3.5–5.1)
Sodium: 122 mmol/L — ABNORMAL LOW (ref 135–145)

## 2023-07-29 MED ORDER — SODIUM CHLORIDE 0.9 % IV SOLN: INTRAVENOUS | Status: DC

## 2023-07-29 NOTE — Progress Notes (Signed)
PROGRESS NOTE     Gabrielle [name], is a 77 y.o. female, DOB - 1946/03/10, RUE:[id number]  Admit date - 07/28/2023   Admitting Physician Vassie Loll, MD  Outpatient Primary MD for the patient is [name], DO  LOS - 1  Chief Complaint  Patient presents with   Weakness        Brief Narrative:   77 y.o. female with medical history significant of hypertension, hyperlipidemia, paroxysmal atrial fibrillation and chronic kidney disease stage IIIb--- admitted on 07/28/2023 with generalized weakness fatigue and malaise and found to have a sodium of 114    -Assessment and Plan: Severe symptomatic hyponatremia -In the setting of decreased oral intake along with continued use of diuretics. -Hold diuretics -Encourage increased oral intake --sodium is up to 122 from 114 -Monitor closely and continue to maintain adequate hydration  Atrial fibrillation (HCC) -Rate controlled -Continue Eliquis for stroke prophylaxis  Acute kidney injury superimposed on chronic kidney disease 3B  creatinine on admission=1.72  ,  baseline creatinine = 1.3    -Hold Aldactone and Hyzaar -- Creatinine is trending down with hydration,  -- renally adjust medications, avoid nephrotoxic agents / dehydration  / hypotension   Mixed hyperlipidemia -Continue statin.  Essential hypertension -Stable -continue Norvasc; holding hydralazine, Hyzaar and Aldactone -Maintain adequate hydration and follow vital signs  Status is: Inpatient   Disposition: The patient is from: Home              Anticipated d/c is to: Home              Anticipated d/c date is: 2 days              Patient currently is not medically stable to d/c. Barriers: Not Clinically Stable-   Code Status :  -  Code Status: DNR   Family Communication:    NA (patient is alert, awake and coherent)   DVT Prophylaxis  :   - SCDs    apixaban (ELIQUIS) tablet 5 mg   Lab Results  Component Value Date   PLT 179 07/28/2023    Inpatient  Medications  Scheduled Meds:  amLODipine  10 mg Oral Daily   apixaban  5 mg Oral BID   atorvastatin  20 mg Oral Daily   Chlorhexidine Gluconate Cloth  6 each Topical Q0600   dorzolamide-timolol  1 drop Right Eye BID   loratadine  10 mg Oral Daily   mupirocin ointment  1 Application Nasal BID   Continuous Infusions:  sodium chloride 100 mL/hr at 07/29/23 1943   PRN Meds:.acetaminophen **OR** acetaminophen, benzonatate, ondansetron **OR** ondansetron (ZOFRAN) IV   Anti-infectives (From admission, onward)    None         Subjective: Gabrielle [name] today has no fevers, no emesis,  No chest pain,   - Fatigue and malaise persist -Appetite is not great continue to encourage adequate oral intake   Objective: Vitals:   07/29/23 1400 07/29/23 1522 07/29/23 1603 07/29/23 1951  BP: (!) 106/42  (!) 126/95 (!) 125/59  Pulse: 69  72 70  Resp: 12  16 17   Temp:  98.4 F (36.9 C) 97.6 F (36.4 C) 97.8 F (36.6 C)  TempSrc:  Oral Oral Oral  SpO2: 96%  100% 100%  Weight:   80.6 kg   Height:   5\' 2"  (1.575 m)     Intake/Output Summary (Last 24 hours) at 07/29/2023 2007 Last data filed at 07/29/2023 1916 Gross per 24 hour  Intake 2254.52  ml  Output 200 ml  Net 2054.52 ml   Filed Weights   07/28/23 1431 07/29/23 0507 07/29/23 1603  Weight: 82.5 kg 81 kg 80.6 kg    Physical Exam  Gen:- Awake Alert,  in no apparent distress  HEENT:- [place].AT, No sclera icterus Neck-Supple Neck,No JVD,.  Lungs-  CTAB , fair symmetrical air movement CV- S1, S2 normal, regular  Abd-  +ve B.Sounds, Abd Soft, No tenderness,    Extremity/Skin:- No  edema, pedal pulses present  Psych-affect is appropriate, oriented x3 Neuro-no new focal deficits, no tremors  Data Reviewed: I have personally reviewed following labs and imaging studies  CBC: Recent Labs  Lab 07/28/23 1147  WBC 8.0  HGB 11.2*  HCT 31.9*  MCV 87.9  PLT 179   Basic Metabolic Panel: Recent Labs  Lab 07/28/23 1147  07/28/23 1711 07/28/23 2232 07/29/23 0428  NA 114* 117* 121* 122*  K 4.9 5.1 5.1 4.7  CL 80* 83* 87* 89*  CO2 22 24 25 24   GLUCOSE 149* 143* 119* 130*  BUN 34* 30* 26* 25*  CREATININE 1.72* 1.56* 1.38* 1.30*  CALCIUM 9.5 9.1 9.1 9.0   GFR: Estimated Creatinine Clearance: 36.2 mL/min (A) (by C-G formula based on SCr of 1.3 mg/dL (H)).  Recent Results (from the past 240 hour(s))  COVID-19, Flu A+B and RSV     Status: None   Collection Time: 07/24/23  4:32 PM   Specimen: Nasal Swab  Result Value Ref Range Status   SARS-CoV-2, NAA Not Detected Not Detected Final   Influenza A, NAA Not Detected Not Detected Final   Influenza B, NAA Not Detected Not Detected Final   RSV, NAA Not Detected Not Detected Final   Test Information: Comment  Final    Comment: This nucleic acid amplification test was developed and its performance characteristics determined by [employer]. Nucleic acid amplification tests include RT-PCR and TMA. This test has not been FDA cleared or approved. This test has been authorized by FDA under an Emergency Use Authorization (EUA). This test is only authorized for the duration of time the declaration that circumstances exist justifying the authorization of the emergency use of in vitro diagnostic tests for detection of SARS-CoV-2 virus and/or diagnosis of COVID-19 infection under section 564(b)(1) of the Act, 21 U.S.C. 161WRU-0(A) (1), unless the authorization is terminated or revoked sooner. When diagnostic testing is negative, the possibility of a false negative result should be considered in the context of a patient's recent exposures and the presence of clinical signs and symptoms consistent with COVID-19. An individual without symptoms of COVID-19 and who is not shedding SARS-CoV-2 virus wo uld expect to have a negative (not detected) result in this assay.   SARS Coronavirus 2 by RT PCR (hospital order, performed in [facility] hospital lab) *cepheid  single result test* Anterior Nasal Swab     Status: None   Collection Time: 07/28/23 12:15 PM   Specimen: Anterior Nasal Swab  Result Value Ref Range Status   SARS Coronavirus 2 by RT PCR NEGATIVE NEGATIVE Final    Comment: (NOTE) SARS-CoV-2 target nucleic acids are NOT DETECTED.  The SARS-CoV-2 RNA is generally detectable in upper and lower respiratory specimens during the acute phase of infection. The lowest concentration of SARS-CoV-2 viral copies this assay can detect is 250 copies / mL. A negative result does not preclude SARS-CoV-2 infection and should not be used as the sole basis for treatment or other patient management decisions.  A negative result  may occur with improper specimen collection / handling, submission of specimen other than nasopharyngeal swab, presence of viral mutation(s) within the areas targeted by this assay, and inadequate number of viral copies (<250 copies / mL). A negative result must be combined with clinical observations, patient history, and epidemiological information.  Fact Sheet for Patients:   RoadLapTop.co.za  Fact Sheet for Healthcare Providers: http://kim-miller.com/  This test is not yet approved or  cleared by the [place] FDA and has been authorized for detection and/or diagnosis of SARS-CoV-2 by FDA under an Emergency Use Authorization (EUA).  This EUA will remain in effect (meaning this test can be used) for the duration of the COVID-19 declaration under Section 564(b)(1) of the Act, 21 U.S.C. section 360bbb-3(b)(1), unless the authorization is terminated or revoked sooner.  Performed at [facility], [address] [place], [place] [address]   Urine Culture     Status: None   Collection Time: 07/28/23 12:36 PM   Specimen: Urine, Clean Catch  Result Value Ref Range Status   Specimen Description   Final    URINE, CLEAN CATCH Performed at [facility], [address],  [place], [place] [address]    Special Requests   Final    NONE Performed at [facility], [address], [place], [place] [address]    Culture   Final    NO GROWTH Performed at [facility], [address], [place], [place] [address]    Report Status 07/29/2023 FINAL  Final  Culture, blood (routine x 2)     Status: None (Preliminary result)   Collection Time: 07/28/23 12:45 PM   Specimen: BLOOD  Result Value Ref Range Status   Specimen Description BLOOD RIGHT ANTECUBITAL  Final   Special Requests   Final    BOTTLES DRAWN AEROBIC AND ANAEROBIC Blood Culture results may not be optimal due to an excessive volume of blood received in culture bottles   Culture   Final    NO GROWTH < 24 HOURS Performed at [facility], [address], [place], [place] [address]    Report Status PENDING  Incomplete  Culture, blood (routine x 2)     Status: None (Preliminary result)   Collection Time: 07/28/23 12:45 PM   Specimen: BLOOD  Result Value Ref Range Status   Specimen Description BLOOD BLOOD LEFT WRIST  Final   Special Requests   Final    BOTTLES DRAWN AEROBIC AND ANAEROBIC Blood Culture adequate volume   Culture   Final    NO GROWTH < 24 HOURS Performed at [facility], [address] [place], [place] [address]    Report Status PENDING  Incomplete  MRSA Next Gen by PCR, Nasal     Status: Abnormal   Collection Time: 07/28/23  2:25 PM   Specimen: Nasal Mucosa; Nasal Swab  Result Value Ref Range Status   MRSA by PCR Next Gen DETECTED (A) NOT DETECTED Final    Comment: RESULT CALLED TO, READ BACK BY AND VERIFIED WITH:  EDGAR TINAJERO 07/28/23 1629 NN (NOTE) The GeneXpert MRSA Assay (FDA approved for NASAL specimens only), is one component of a comprehensive MRSA colonization surveillance program. It is not intended to diagnose MRSA infection nor to guide or monitor treatment for MRSA infections. Test performance is not FDA approved in patients less than 9 years old. Performed at [facility], [address] [place], [place] [address]     Radiology Studies: DG Chest 2 View  Result Date: 07/28/2023  CLINICAL DATA:  Weakness EXAM: CHEST - 2 VIEW COMPARISON:  07/04/2020 FINDINGS: Left-sided Port-A-Cath with the tip unchanged in position. No focal consolidation. No pleural effusion or pneumothorax. Heart and mediastinal contours are unremarkable. No acute osseous abnormality. IMPRESSION: No active cardiopulmonary disease. Electronically Signed   By: Elige Ko M.D.   On: 07/28/2023 12:43     Scheduled Meds:  amLODipine  10 mg Oral Daily   apixaban  5 mg Oral BID   atorvastatin  20 mg Oral Daily   Chlorhexidine Gluconate Cloth  6 each Topical Q0600   dorzolamide-timolol  1 drop Right Eye BID   loratadine  10 mg Oral Daily   mupirocin ointment  1 Application Nasal BID   Continuous Infusions:  sodium chloride 100 mL/hr at 07/29/23 1943     LOS: 1 day    [name] M.D on 07/29/2023 at 8:07 PM  Go to [email or web address] - for contact info  [facility] - Office  [phone]  If 7PM-7AM, please contact night-coverage [email or web address] 07/29/2023, 8:07 PM

## 2023-07-29 NOTE — Progress Notes (Signed)
   07/29/23 0849  TOC Brief Assessment  Insurance and Status Reviewed  Patient has primary care physician Yes  Home environment has been reviewed From home  Prior level of function: Independent  Prior/Current Home Services No current home services  Social Determinants of Health Reivew SDOH reviewed no interventions necessary  Readmission risk has been reviewed Yes  Transition of care needs no transition of care needs at this time

## 2023-07-30 DIAGNOSIS — E871 Hypo-osmolality and hyponatremia: Secondary | ICD-10-CM | POA: Diagnosis not present

## 2023-07-30 LAB — RENAL FUNCTION PANEL
Albumin: 3.5 g/dL (ref 3.5–5.0)
Anion gap: 6 (ref 5–15)
BUN: 19 mg/dL (ref 8–23)
CO2: 25 mmol/L (ref 22–32)
Calcium: 8.8 mg/dL — ABNORMAL LOW (ref 8.9–10.3)
Chloride: 96 mmol/L — ABNORMAL LOW (ref 98–111)
Creatinine, Ser: 1.14 mg/dL — ABNORMAL HIGH (ref 0.44–1.00)
GFR, Estimated: 50 mL/min — ABNORMAL LOW (ref >60)
Glucose, Bld: 96 mg/dL (ref 70–99)
Phosphorus: 2 mg/dL — ABNORMAL LOW (ref 2.5–4.6)
Potassium: 4.7 mmol/L (ref 3.5–5.1)
Sodium: 127 mmol/L — ABNORMAL LOW (ref 135–145)

## 2023-07-30 MED ORDER — SODIUM CHLORIDE 0.9 % IV SOLN: INTRAVENOUS | Status: AC

## 2023-07-30 NOTE — Progress Notes (Signed)
PROGRESS NOTE   Gabrielle [name], is a 77 y.o. female, DOB - 04/07/1946, ZOX:[id number]  Admit date - 07/28/2023   Admitting Physician Vassie Loll, MD  Outpatient Primary MD for the patient is [name], DO  LOS - 2  Chief Complaint  Patient presents with   Weakness       Brief Narrative:   77 y.o. female with medical history significant of hypertension, hyperlipidemia, paroxysmal atrial fibrillation and chronic kidney disease stage IIIb--- admitted on 07/28/2023 with generalized weakness fatigue and malaise and found to have a sodium of 114 and chloride of 80    -Assessment and Plan: Severe symptomatic hyponatremia and hypochloremia -In the setting of decreased oral intake along with continued use of diuretics. -Hold diuretics -Encourage increased oral intake 07/30/23 --sodium is up to 127 from 114 -Chloride is up to 96 from 80 -Monitor closely and continue to maintain adequate hydration  Atrial fibrillation (HCC) -Rate controlled -Continue Eliquis for stroke prophylaxis  Acute kidney injury superimposed on chronic kidney disease 3B  creatinine on admission=1.72  ,  baseline creatinine = 1.3    -Hold Aldactone and Hyzaar -- Creatinine is trending down with hydration,  -- renally adjust medications, avoid nephrotoxic agents / dehydration  / hypotension  Mixed hyperlipidemia -Continue statin.  Essential hypertension -Stable -continue Norvasc; holding hydralazine, Hyzaar and Aldactone -Maintain adequate hydration and follow vital signs  Status is: Inpatient   Disposition: The patient is from: Home              Anticipated d/c is to: Home              Anticipated d/c date is: 1 day --anticipate discharge home on 07/31/2023              Patient currently is not medically stable to d/c. Barriers: Not Clinically Stable-   Code Status :  -  Code Status: DNR   Family Communication:    NA (patient is alert, awake and coherent)   DVT Prophylaxis  :   - SCDs     apixaban (ELIQUIS) tablet 5 mg   Lab Results  Component Value Date   PLT 179 07/28/2023    Inpatient Medications  Scheduled Meds:  amLODipine  10 mg Oral Daily   apixaban  5 mg Oral BID   atorvastatin  20 mg Oral Daily   dorzolamide-timolol  1 drop Right Eye BID   loratadine  10 mg Oral Daily   mupirocin ointment  1 Application Nasal BID   Continuous Infusions:  sodium chloride 80 mL/hr at 07/30/23 0842   PRN Meds:.acetaminophen **OR** acetaminophen, benzonatate, ondansetron **OR** ondansetron (ZOFRAN) IV   Anti-infectives (From admission, onward)    None      Subjective: Gabrielle [name] today has no fevers, no emesis,  No chest pain,   - -Appetite improving somewhat -Fatigue and generalized weakness is not worse Objective: Vitals:   07/29/23 2337 07/30/23 0403 07/30/23 0412 07/30/23 1329  BP: 122/61 (!) 127/58  (!) 119/54  Pulse: 61 67  69  Resp: 16 18    Temp: 98 F (36.7 C) 97.8 F (36.6 C)  98.4 F (36.9 C)  TempSrc: Oral   Oral  SpO2: 97% 99%  100%  Weight:   80.3 kg   Height:        Intake/Output Summary (Last 24 hours) at 07/30/2023 1559 Last data filed at 07/30/2023 1500 Gross per 24 hour  Intake 1390.57 ml  Output --  Net 1390.57 ml  Filed Weights   07/29/23 0507 07/29/23 1603 07/30/23 0412  Weight: 81 kg 80.6 kg 80.3 kg   Physical Exam Gen:- Awake Alert,  in no apparent distrss  HEENT:- [place].AT, No sclera icterus Neck-Supple Neck,No JVD,.  Lungs-  CTAB , fair symmetrical air movement CV- S1, S2 normal, regular  Abd-  +ve B.Sounds, Abd Soft, No tenderness,    Extremity/Skin:- No  edema, pedal pulses present  Psych-affect is appropriate, oriented x3 Neuro-generalized weakness, no new focal deficits, no tremors  Data Reviewed: I have personally reviewed following labs and imaging studies  CBC: Recent Labs  Lab 07/28/23 1147  WBC 8.0  HGB 11.2*  HCT 31.9*  MCV 87.9  PLT 179   Basic Metabolic Panel: Recent Labs  Lab  07/28/23 1147 07/28/23 1711 07/28/23 2232 07/29/23 0428 07/30/23 0412  NA 114* 117* 121* 122* 127*  K 4.9 5.1 5.1 4.7 4.7  CL 80* 83* 87* 89* 96*  CO2 22 24 25 24 25   GLUCOSE 149* 143* 119* 130* 96  BUN 34* 30* 26* 25* 19  CREATININE 1.72* 1.56* 1.38* 1.30* 1.14*  CALCIUM 9.5 9.1 9.1 9.0 8.8*  PHOS  --   --   --   --  2.0*   GFR: Estimated Creatinine Clearance: 41.2 mL/min (A) (by C-G formula based on SCr of 1.14 mg/dL (H)).  Recent Results (from the past 240 hour(s))  COVID-19, Flu A+B and RSV     Status: None   Collection Time: 07/24/23  4:32 PM   Specimen: Nasal Swab  Result Value Ref Range Status   SARS-CoV-2, NAA Not Detected Not Detected Final   Influenza A, NAA Not Detected Not Detected Final   Influenza B, NAA Not Detected Not Detected Final   RSV, NAA Not Detected Not Detected Final   Test Information: Comment  Final    Comment: This nucleic acid amplification test was developed and its performance characteristics determined by [employer]. Nucleic acid amplification tests include RT-PCR and TMA. This test has not been FDA cleared or approved. This test has been authorized by FDA under an Emergency Use Authorization (EUA). This test is only authorized for the duration of time the declaration that circumstances exist justifying the authorization of the emergency use of in vitro diagnostic tests for detection of SARS-CoV-2 virus and/or diagnosis of COVID-19 infection under section 564(b)(1) of the Act, 21 U.S.C. 629BMW-4(X) (1), unless the authorization is terminated or revoked sooner. When diagnostic testing is negative, the possibility of a false negative result should be considered in the context of a patient's recent exposures and the presence of clinical signs and symptoms consistent with COVID-19. An individual without symptoms of COVID-19 and who is not shedding SARS-CoV-2 virus wo uld expect to have a negative (not detected) result in this assay.    SARS Coronavirus 2 by RT PCR (hospital order, performed in [facility] hospital lab) *cepheid single result test* Anterior Nasal Swab     Status: None   Collection Time: 07/28/23 12:15 PM   Specimen: Anterior Nasal Swab  Result Value Ref Range Status   SARS Coronavirus 2 by RT PCR NEGATIVE NEGATIVE Final    Comment: (NOTE) SARS-CoV-2 target nucleic acids are NOT DETECTED.  The SARS-CoV-2 RNA is generally detectable in upper and lower respiratory specimens during the acute phase of infection. The lowest concentration of SARS-CoV-2 viral copies this assay can detect is 250 copies / mL. A negative result does not preclude SARS-CoV-2 infection and should not be used as  the sole basis for treatment or other patient management decisions.  A negative result may occur with improper specimen collection / handling, submission of specimen other than nasopharyngeal swab, presence of viral mutation(s) within the areas targeted by this assay, and inadequate number of viral copies (<250 copies / mL). A negative result must be combined with clinical observations, patient history, and epidemiological information.  Fact Sheet for Patients:   RoadLapTop.co.za  Fact Sheet for Healthcare Providers: http://kim-miller.com/  This test is not yet approved or  cleared by the [place] FDA and has been authorized for detection and/or diagnosis of SARS-CoV-2 by FDA under an Emergency Use Authorization (EUA).  This EUA will remain in effect (meaning this test can be used) for the duration of the COVID-19 declaration under Section 564(b)(1) of the Act, 21 U.S.C. section 360bbb-3(b)(1), unless the authorization is terminated or revoked sooner.  Performed at [facility], [address], [place], [place] [address]   Urine Culture     Status: None   Collection Time: 07/28/23 12:36 PM   Specimen: Urine, Clean Catch  Result Value Ref Range Status   Specimen  Description   Final    URINE, CLEAN CATCH Performed at [facility], [address], [place], [place] [address]    Special Requests   Final    NONE Performed at [facility], [address], [place], [place] [address]    Culture   Final    NO GROWTH Performed at [facility], [address] [place], [place] [address]    Report Status 07/29/2023 FINAL  Final  Culture, blood (routine x 2)     Status: None (Preliminary result)   Collection Time: 07/28/23 12:45 PM   Specimen: BLOOD  Result Value Ref Range Status   Specimen Description BLOOD RIGHT ANTECUBITAL  Final   Special Requests   Final    BOTTLES DRAWN AEROBIC AND ANAEROBIC Blood Culture results may not be optimal due to an excessive volume of blood received in culture bottles   Culture   Final    NO GROWTH 2 DAYS Performed at [facility], [address], [place], [place] [address]    Report Status PENDING  Incomplete  Culture, blood (routine x 2)     Status: None (Preliminary result)   Collection Time: 07/28/23 12:45 PM   Specimen: BLOOD  Result Value Ref Range Status   Specimen Description BLOOD BLOOD LEFT WRIST  Final   Special Requests   Final    BOTTLES DRAWN AEROBIC AND ANAEROBIC Blood Culture adequate volume   Culture   Final    NO GROWTH 2 DAYS Performed at [facility], [address] [place], [place] [address]    Report Status PENDING  Incomplete  MRSA Next Gen by PCR, Nasal     Status: Abnormal   Collection Time: 07/28/23  2:25 PM   Specimen: Nasal Mucosa; Nasal Swab  Result Value Ref Range Status   MRSA by PCR Next Gen DETECTED (A) NOT DETECTED Final    Comment: RESULT CALLED TO, READ BACK BY AND VERIFIED WITH:  EDGAR TINAJERO 07/28/23 1629 NN (NOTE) The GeneXpert MRSA Assay (FDA approved for NASAL specimens only), is one component of a comprehensive MRSA colonization surveillance program. It is not intended to diagnose MRSA infection nor to guide or monitor treatment for MRSA infections. Test  performance is not FDA approved in patients less than 75 years old. Performed at [facility], [address] [place], [place] [address]  Radiology Studies:  Scheduled Meds:  amLODipine  10 mg Oral Daily   apixaban  5 mg Oral BID   atorvastatin  20 mg Oral Daily   dorzolamide-timolol  1 drop Right Eye BID   loratadine  10 mg Oral Daily   mupirocin ointment  1 Application Nasal BID   Continuous Infusions:  sodium chloride 80 mL/hr at 07/30/23 0842     LOS: 2 days    [name] M.D on 07/30/2023 at 3:59 PM  Go to [email or web address] - for contact info  [facility] - Office  [phone]  If 7PM-7AM, please contact night-coverage [email or web address] 07/30/2023, 3:59 PM

## 2023-07-30 NOTE — Plan of Care (Signed)

## 2023-07-31 DIAGNOSIS — E871 Hypo-osmolality and hyponatremia: Secondary | ICD-10-CM | POA: Diagnosis not present

## 2023-07-31 LAB — BASIC METABOLIC PANEL
Anion gap: 5 (ref 5–15)
BUN: 17 mg/dL (ref 8–23)
CO2: 24 mmol/L (ref 22–32)
Calcium: 8.5 mg/dL — ABNORMAL LOW (ref 8.9–10.3)
Chloride: 100 mmol/L (ref 98–111)
Creatinine, Ser: 1.06 mg/dL — ABNORMAL HIGH (ref 0.44–1.00)
GFR, Estimated: 54 mL/min — ABNORMAL LOW (ref >60)
Glucose, Bld: 95 mg/dL (ref 70–99)
Potassium: 4.3 mmol/L (ref 3.5–5.1)
Sodium: 129 mmol/L — ABNORMAL LOW (ref 135–145)

## 2023-07-31 MED ORDER — IRON 325 (65 FE) MG PO TABS: 1.0000 | ORAL_TABLET | Freq: Every day | ORAL | 3 refills | Status: AC

## 2023-07-31 MED ORDER — LOSARTAN POTASSIUM-HCTZ 100-25 MG PO TABS
0.5000 | ORAL_TABLET | Freq: Every day | ORAL | 3 refills | Status: DC
Start: 2023-07-31 — End: 2023-08-03

## 2023-07-31 MED ORDER — POTASSIUM CHLORIDE ER 20 MEQ PO TBCR
20.0000 meq | EXTENDED_RELEASE_TABLET | Freq: Every day | ORAL | 1 refills | Status: DC
Start: 2023-07-31 — End: 2024-01-24

## 2023-07-31 NOTE — Consult Note (Signed)
[facility] Customer service manager [facility]) Accountable Care Organization (ACO) [facility] Liaison Note  07/31/2023  Gabrielle [name] 05/02/1946 [id number]  Location: [facility] RN Hospital Liaison screened the patient remotely at [facility]  Insurance: [facility] HMO   Gabrielle [name] is a 77 y.o. female who is a Primary Care Patient of [name], DO. The patient was screened for readmission hospitalization with noted medium risk score for unplanned readmission risk with 1 IP in 6 months.  The patient was assessed for potential [facility]) Care Management service needs for post hospital transition for care coordination. Review of patient's electronic medical record reveals patient was admitted for hyponatremia.  Pt set up for [facility] services for ongoing therapy. No additional needs presented at that time.   Plan: [facility] Liaison will continue to follow progress and disposition to asess for post hospital community care coordination/management needs.  Referral request for community care coordination: anticipate [facility] Transitions of Care Team follow up.   [facility] Care Management/Population Health does not replace or interfere with any arrangements made by the Inpatient Transition of Care team.   For questions contact:   Gabrielle Cousin, RN, [facility] Liaison [place]   Population Health Office Hours MTWF  8:00 am-6:00 pm [phone] mobile [phone] [Office toll free line] Office Hours are M-F 8:30 - 5 pm [name]

## 2023-07-31 NOTE — TOC Transition Note (Signed)
Transition of Care [facility]) - CM/SW Discharge Note   Patient Details  Name: Gabrielle [name] MRN: [id number] Date of Birth: July 08, 1946  Transition of Care [facility]) CM/SW Contact:  Leitha Bleak, RN Phone Number: 07/31/2023, 12:05 PM   Clinical Narrative:   Patient discharging home today. CM spoke with her daughter, she lives in an apartment for seniors. She has friends there to call for transport. She is weak and does want HHPT. MD aware to order. Referral sent to  [facility] with Frances Furbish, She has used them in the past and wishes to have them come again.    Final next level of care: Home w Home Health Services Barriers to Discharge: Barriers Resolved  Patient Goals and CMS Choice CMS Medicare.gov Compare Post Acute Care list provided to:: Patient Represenative (must comment) Choice offered to / list presented to : Adult Children  Discharge Placement              Patient to be transferred to facility by: Neighbor/ friend Name of family member notified: Daughter Patient and family notified of of transfer: 07/31/23  Discharge Plan and Services Additional resources added to the After Visit Summary for    [facility] Arranged: PT HH Agency: [facility] Date [facility] Agency Contacted: 07/31/23 Time HH Agency Contacted: 1204 Representative spoke with at [facility] Agency: Kandee Keen  Social Determinants of Health (SDOH) Interventions SDOH Screenings   Food Insecurity: No Food Insecurity (07/28/2023)  Housing: Low Risk  (07/28/2023)  Transportation Needs: No Transportation Needs (07/28/2023)  Utilities: Not At Risk (07/28/2023)  Alcohol Screen: Low Risk  (03/15/2023)  Depression (PHQ2-9): Low Risk  (07/24/2023)  Financial Resource Strain: Low Risk  (03/15/2023)  Physical Activity: Sufficiently Active (03/15/2023)  Social Connections: Moderately Integrated (03/15/2023)  Stress: No Stress Concern Present (03/15/2023)  Tobacco Use: Low Risk  (07/28/2023)   Readmission Risk Interventions     No data to display

## 2023-07-31 NOTE — Progress Notes (Signed)
Patient discharged with instructions given on medications and follow up visits ,patient verbalize understanding . Prescriptions sent to Pharmacy of choice documented on AVS . IV discontinued, catheter intact. Accompanied by staff to an awaiting vehicle.

## 2023-07-31 NOTE — Discharge Summary (Signed)
Gabrielle [name], is a 77 y.o. female  DOB 1946-03-06  MRN [id number].  Admission date:  07/28/2023  Admitting Physician  Vassie Loll, MD  Discharge Date:  07/31/2023   Primary MD  [name], DO  Recommendations for primary care physician for things to follow:  1)Stop Aldactone/spironolactone due to low sodium 2)Decrease losartan HCTZ to half a tablet daily due to low sodium 3)Avoid excessive plain water intake, okay to drink other liquids besides plain water 4)Repeat BMP Blood Test on Friday 08/04/23  Admission Diagnosis  Hyponatremia [E87.1]   Discharge Diagnosis  Hyponatremia [E87.1]    Principal Problem:   Hyponatremia Active Problems:   Essential hypertension   Mixed hyperlipidemia   Stage 3 chronic kidney disease (HCC)   Acute kidney injury superimposed on chronic kidney disease (HCC)   Atrial fibrillation (HCC)      Past Medical History:  Diagnosis Date   Arthritis    Cancer (HCC)    Cataract    OU   Colon cancer (HCC)    Family history of pancreatic cancer 08/21/2020   Family history of uterine cancer 08/21/2020   H/O cesarean section    Hx of tonsillectomy    Hyperlipidemia    Hypertension    Kidney disease    Macular degeneration    Exu ARMD OU   Paroxysmal atrial fibrillation (HCC) 07/05/2020   Stroke (cerebrum) (HCC) 07/05/2020   Urinary retention 04/15/2020    Past Surgical History:  Procedure Laterality Date   BIOPSY  04/06/2020   Procedure: BIOPSY;  Surgeon: Malissa Hippo, MD;  [facility];  Service: Endoscopy;;   CARPAL TUNNEL RELEASE Right    CATARACT EXTRACTION W/PHACO Left 10/11/2019   Procedure: CATARACT EXTRACTION PHACO AND INTRAOCULAR LENS PLACEMENT (IOC);  Surgeon: Fabio Pierce, MD;  Location: [facility];  Service: Ophthalmology;  Laterality: Left;  CDE: 8.56   CATARACT EXTRACTION W/PHACO Right 10/25/2019   Procedure: CATARACT EXTRACTION  PHACO AND INTRAOCULAR LENS PLACEMENT (IOC);  Surgeon: Fabio Pierce, MD;  Location: [facility];  Service: Ophthalmology;  Laterality: Right;  CDE: 5.67   CESAREAN SECTION     COLONOSCOPY N/A 04/06/2020   Procedure: COLONOSCOPY;  Surgeon: Malissa Hippo, MD;  [facility];  Service: Endoscopy;  Laterality: N/A;   CYSTOSCOPY WITH BIOPSY N/A 04/08/2020   Procedure: CYSTOSCOPY WITH BIOPSY;  Surgeon: Malen Gauze, MD;  Location: [facility];  Service: Urology;  Laterality: N/A;   ESOPHAGOGASTRODUODENOSCOPY N/A 04/05/2020   Procedure: ESOPHAGOGASTRODUODENOSCOPY (EGD);  Surgeon: Malissa Hippo, MD;  [facility];  Service: Endoscopy;  Laterality: N/A;   PARTIAL COLECTOMY N/A 04/08/2020   Procedure: PARTIAL COLECTOMY;  Surgeon: Lucretia Roers, MD;  Location: [facility];  Service: General;  Laterality: N/A;   PORTACATH PLACEMENT Left 05/18/2020   Procedure: INSERTION PORT-A-CATH (ATTACHED CATHETER IN LEFT SUBCLAVIAN);  Surgeon: Lucretia Roers, MD;  Location: [facility];  Service: General;  Laterality: Left;   TONSILLECTOMY         HPI  from  the history and physical done on the day of admission:     HPI: Gabrielle [name] is a 77 y.o. female with medical history significant of hypertension, hyperlipidemia, paroxysmal atrial fibrillation and chronic kidney disease stage IIIb; presented to the hospital secondary to weakness and not feeling well.  Patient reports approximately 4 5 days she had had some episodes of URI symptoms for which she received codeine medicine and antihistamines.  Symptoms improved/stabilized with those medications but experienced decreased oral intake and on the day of admission severe weak to even get out of bed.  Patient reports some nausea and pulse to see if emesis earlier in the week; improving/resolving now.    Patient denies fever, dysuria, abdominal pain, chest pain, seek contacts, focal weaknesses or any other complaints.   In the ED workup demonstrated  negative COVID test, chest x-ray without acute cardiopulmonary process.  And blood work demonstrating acute kidney injury on chronic renal failure with significant hyponatremia.  TRH has been called to place patient in the hospital for further evaluation and management.    Review of Systems: As mentioned in the history of present illness. All other systems reviewed and are negative.   Hospital Course:   Brief Narrative:   77 y.o. female with medical history significant of hypertension, hyperlipidemia, paroxysmal atrial fibrillation and chronic kidney disease stage IIIb--- admitted on 07/28/2023 with generalized weakness fatigue and malaise and found to have a sodium of 114 and chloride of 80     -Assessment and Plan: Severe symptomatic hyponatremia and hypochloremia -In the setting of decreased oral intake along with continued use of diuretics. -Encourage increased oral intake --sodium is up to 129 from 114 -Chloride is up to 100 from 80 -Avoid excessive free water intake - decrease HCTZ to 12.5 mg daily from 25 mg -Stop Aldactone -Repeat BMP on Friday, 08/04/2023   Atrial fibrillation (HCC) -Rate controlled without AV nodal blocking agents -Continue Eliquis for stroke prophylaxis   Acute kidney injury superimposed on chronic kidney disease 3B  creatinine on admission=1.72  ,  baseline creatinine = 1.3    Creatinine is down to 1.0 with hydration -Repeat BMP on Friday, 08/04/2023   Mixed hyperlipidemia -Continue statin.   Essential hypertension -Stable -continue Norvasc;  -HCTZ and Aldactone adjusted as above in #1  Generalized weakness and deconditioning--- Home health physical therapy requested   Disposition: The patient is from: [facility]              Anticipated d/c is to: Home with home health physical therapy  Code Status :  -  Code Status: DNR    Discharge Condition: stable  Follow UP   Follow-up Information     [facility], [facility]  Health Follow up.   Specialty: Home Health Services Why: PT will call to schedule your first home visit. Contact information: [address] [place] [address] [phone]         [name], DO. Schedule an appointment as soon as possible for a visit on 08/04/2023.   Specialty: Family Medicine Why: for Repeat BMP Contact information: [address] [place] [address] [phone]                 Diet and Activity recommendation:  As advised  Discharge Instructions    Discharge Instructions     Call MD for:  difficulty breathing, headache or visual disturbances   Complete by: As directed    Call MD for:  persistant dizziness or light-headedness   Complete by: As directed    Call MD for:  persistant nausea and vomiting   Complete by: As directed    Call MD for:  temperature >100.4   Complete by: As directed    Diet general   Complete by: As directed    Discharge instructions   Complete by: As directed    1)Stop Aldactone/spironolactone due to low sodium 2)Decrease losartan HCTZ to half a tablet daily due to low sodium 3)Avoid excessive plain water intake, okay to drink other liquids besides plain water 4)Repeat BMP Blood Test on Friday 08/04/23   Increase activity slowly   Complete by: As directed        Discharge Medications     Allergies as of 07/31/2023       Reactions   Lisinopril Cough        Medication List     STOP taking these medications    benzonatate 100 MG capsule Commonly known as: TESSALON   desloratadine 5 MG tablet Commonly known as: Clarinex   potassium chloride SA 20 MEQ tablet Commonly known as: KLOR-CON M   spironolactone 25 MG tablet Commonly known as: ALDACTONE       TAKE these medications    alendronate 70 MG tablet Commonly known as: FOSAMAX TAKE ONE TABLET EVERY 7 DAYS ON AN EMPTY STOMACH WITH A FULL GLASS OF WATER   amLODipine 10 MG tablet Commonly known as: NORVASC Take 1  tablet (10 mg total) by mouth daily. For blood pressure   atorvastatin 20 MG tablet Commonly known as: LIPITOR Take 1 tablet (20 mg total) by mouth daily. For cholesterol   CENTRUM ADULT PO Take 1 tablet by mouth daily.   cholecalciferol 25 MCG (1000 UNIT) tablet Commonly known as: VITAMIN D3 Take 1,000 Units by mouth daily.   CRANBERRY FRUIT PO Take 1 tablet by mouth daily.   diclofenac Sodium 1 % Gel Commonly known as: VOLTAREN APPLY 4 GRAMS TO AFFECTED AREA(S) 4 TIMES A DAY What changed: See the new instructions.   dorzolamide-timolol 2-0.5 % ophthalmic solution Commonly known as: COSOPT INSTILL ONE DROP IN EACH EYE TWICE DAILY   Eliquis 5 MG Tabs tablet Generic drug: apixaban Take 1 tablet (5 mg total) by mouth 2 (two) times daily.   fluticasone 50 MCG/ACT nasal spray Commonly known as: FLONASE Place 2 sprays into both nostrils daily.   guaiFENesin-codeine 100-10 MG/5ML syrup Take 5 mLs by mouth 3 (three) times daily as needed for cough.   hydrALAZINE 25 MG tablet Commonly known as: APRESOLINE Take 1 tablet (25 mg total) by mouth 2 (two) times daily.   Iron 325 (65 Fe) MG Tabs Take 1 tablet (325 mg total) by mouth daily. What changed:  medication strength how much to take when to take this   levocetirizine 5 MG tablet Commonly known as: XYZAL Take 5 mg by mouth daily as needed for allergies.   losartan-hydrochlorothiazide 100-25 MG tablet Commonly known as: HYZAAR Take 0.5 tablets by mouth daily. What changed: how much to take   Potassium Chloride ER 20 MEQ Tbcr Take 1 tablet (20 mEq total) by mouth daily. Take while taking hydrochlorothiazide/HCTZ        Major procedures and Radiology Reports - PLEASE review detailed and final reports for all details, in brief -   DG Chest 2 View  Result Date: 07/28/2023 CLINICAL DATA:  Weakness EXAM: CHEST - 2 VIEW COMPARISON:  07/04/2020 FINDINGS: Left-sided Port-A-Cath with the tip unchanged  in position. No  focal consolidation. No pleural effusion or pneumothorax. Heart and mediastinal contours are unremarkable. No acute osseous abnormality. IMPRESSION: No active cardiopulmonary disease. Electronically Signed   By: Elige Ko M.D.   On: 07/28/2023 12:43   Intravitreal Injection, Pharmacologic Agent - OD - Right Eye  Result Date: 07/17/2023 Time Out 07/17/2023. 8:56 AM. Confirmed correct patient, procedure, site, and patient consented. Anesthesia Topical anesthesia was used. Anesthetic medications included Lidocaine 2%, Proparacaine 0.5%. Procedure Preparation included 5% betadine to ocular surface, eyelid speculum. A (33g) needle was used. Injection: 2 mg aflibercept 2 MG/0.05ML   Route: Intravitreal, Site: Right Eye   NDC: L6038910, Lot: [id number], Expiration date: 03/04/2024, Waste: 0 mL Post-op Post injection exam found visual acuity of at least counting fingers. The patient tolerated the procedure well. There were no complications. The patient received written and verbal post procedure care education.   Intravitreal Injection, Pharmacologic Agent - OS - Left Eye  Result Date: 07/17/2023 Time Out 07/17/2023. 7:55 AM. Confirmed correct patient, procedure, site, and patient consented. Anesthesia Topical anesthesia was used. Anesthetic medications included Lidocaine 2%, Proparacaine 0.5%. Procedure Preparation included 5% betadine to ocular surface, eyelid speculum. A (32g) needle was used. Injection: 6 mg faricimab-svoa 6 MG/0.05ML   Route: Intravitreal, Site: Left Eye   NDC: O8010301, Lot: [id number], Expiration date: 08/04/2025, Waste: 0 mL Post-op Post injection exam found visual acuity of at least counting fingers. The patient tolerated the procedure well. There were no complications. The patient received written and verbal post procedure care education. Post injection medications were not given.   OCT, Retina - OU - Both Eyes  Result Date: 07/17/2023 Right Eye Quality was good. Central Foveal  Thickness: 497. Progression has been stable. Findings include no SRF, abnormal foveal contour, retinal drusen , outer retinal tubulation, subretinal hyper-reflective material, disciform scar, epiretinal membrane, intraretinal fluid, pigment epithelial detachment, outer retinal atrophy (Stable improvement in sub-retinal scar and central cystic changes within large area of central GA). Left Eye Quality was good. Central Foveal Thickness: 283. Progression has been stable. Findings include no SRF, abnormal foveal contour, retinal drusen , subretinal hyper-reflective material, intraretinal fluid, pigment epithelial detachment, outer retinal atrophy (Stable improvement in cystic changes nasal fovea and macula overlying PED/SRHM ). Notes *Images captured and stored on drive Diagnosis / Impression: Exudative ARMD OU OD: Stable improvement in sub-retinal scar and central cystic changes within large area of central GA OS: stable improvement in cystic changes nasal fovea and macula overlying PED/SRHM Clinical management: See below Abbreviations: NFP - Normal foveal profile. CME - cystoid macular edema. PED - pigment epithelial detachment. IRF - intraretinal fluid. SRF - subretinal fluid. EZ - ellipsoid zone. ERM - epiretinal membrane. ORA - outer retinal atrophy. ORT - outer retinal tubulation. SRHM - subretinal hyper-reflective material    Micro Results   Recent Results (from the past 240 hour(s))  COVID-19, Flu A+B and RSV     Status: None   Collection Time: 07/24/23  4:32 PM   Specimen: Nasal Swab  Result Value Ref Range Status   SARS-CoV-2, NAA Not Detected Not Detected Final   Influenza A, NAA Not Detected Not Detected Final   Influenza B, NAA Not Detected Not Detected Final   RSV, NAA Not Detected Not Detected Final   Test Information: Comment  Final    Comment: This nucleic acid amplification test was developed and its performance characteristics determined by [employer]. Nucleic  acid amplification tests include RT-PCR and TMA. This test has not  been FDA cleared or approved. This test has been authorized by FDA under an Emergency Use Authorization (EUA). This test is only authorized for the duration of time the declaration that circumstances exist justifying the authorization of the emergency use of in vitro diagnostic tests for detection of SARS-CoV-2 virus and/or diagnosis of COVID-19 infection under section 564(b)(1) of the Act, 21 U.S.C. 161WRU-0(A) (1), unless the authorization is terminated or revoked sooner. When diagnostic testing is negative, the possibility of a false negative result should be considered in the context of a patient's recent exposures and the presence of clinical signs and symptoms consistent with COVID-19. An individual without symptoms of COVID-19 and who is not shedding SARS-CoV-2 virus wo uld expect to have a negative (not detected) result in this assay.   SARS Coronavirus 2 by RT PCR (hospital order, performed in [facility] hospital lab) *cepheid single result test* Anterior Nasal Swab     Status: None   Collection Time: 07/28/23 12:15 PM   Specimen: Anterior Nasal Swab  Result Value Ref Range Status   SARS Coronavirus 2 by RT PCR NEGATIVE NEGATIVE Final    Comment: (NOTE) SARS-CoV-2 target nucleic acids are NOT DETECTED.  The SARS-CoV-2 RNA is generally detectable in upper and lower respiratory specimens during the acute phase of infection. The lowest concentration of SARS-CoV-2 viral copies this assay can detect is 250 copies / mL. A negative result does not preclude SARS-CoV-2 infection and should not be used as the sole basis for treatment or other patient management decisions.  A negative result may occur with improper specimen collection / handling, submission of specimen other than nasopharyngeal swab, presence of viral mutation(s) within the areas targeted by this assay, and inadequate number of viral copies (<250  copies / mL). A negative result must be combined with clinical observations, patient history, and epidemiological information.  Fact Sheet for Patients:   RoadLapTop.co.za  Fact Sheet for Healthcare Providers: http://kim-miller.com/  This test is not yet approved or  cleared by the [place] FDA and has been authorized for detection and/or diagnosis of SARS-CoV-2 by FDA under an Emergency Use Authorization (EUA).  This EUA will remain in effect (meaning this test can be used) for the duration of the COVID-19 declaration under Section 564(b)(1) of the Act, 21 U.S.C. section 360bbb-3(b)(1), unless the authorization is terminated or revoked sooner.  Performed at [facility], [address] [place], [place] [address]   Urine Culture     Status: None   Collection Time: 07/28/23 12:36 PM   Specimen: Urine, Clean Catch  Result Value Ref Range Status   Specimen Description   Final    URINE, CLEAN CATCH Performed at [facility], [address] [place], [place] [address]    Special Requests   Final    NONE Performed at [facility], [address] [place], [place] [address]    Culture   Final    NO GROWTH Performed at [facility], [address], [place], [place] [address]    Report Status 07/29/2023 FINAL  Final  Culture, blood (routine x 2)     Status: None (Preliminary result)   Collection Time: 07/28/23 12:45 PM   Specimen: BLOOD  Result Value Ref Range Status   Specimen Description BLOOD RIGHT ANTECUBITAL  Final   Special Requests   Final    BOTTLES DRAWN AEROBIC AND ANAEROBIC Blood Culture results may not be optimal due to an excessive volume of blood received in culture bottles  Culture   Final    NO GROWTH 3 DAYS Performed at [facility], [address] [place], [place] [address]    Report Status PENDING  Incomplete  Culture, blood (routine x 2)     Status: None (Preliminary result)   Collection Time: 07/28/23  12:45 PM   Specimen: BLOOD  Result Value Ref Range Status   Specimen Description BLOOD BLOOD LEFT WRIST  Final   Special Requests   Final    BOTTLES DRAWN AEROBIC AND ANAEROBIC Blood Culture adequate volume   Culture   Final    NO GROWTH 3 DAYS Performed at [facility], [address], [place], [place] [address]    Report Status PENDING  Incomplete  MRSA Next Gen by PCR, Nasal     Status: Abnormal   Collection Time: 07/28/23  2:25 PM   Specimen: Nasal Mucosa; Nasal Swab  Result Value Ref Range Status   MRSA by PCR Next Gen DETECTED (A) NOT DETECTED Final    Comment: RESULT CALLED TO, READ BACK BY AND VERIFIED WITH:  EDGAR TINAJERO 07/28/23 1629 NN (NOTE) The GeneXpert MRSA Assay (FDA approved for NASAL specimens only), is one component of a comprehensive MRSA colonization surveillance program. It is not intended to diagnose MRSA infection nor to guide or monitor treatment for MRSA infections. Test performance is not FDA approved in patients less than 58 years old. Performed at [facility], [address] [place], [place] [address]     Today   Subjective    Gabrielle [name] today has no new complaints  No fever  Or chills   No Nausea, Vomiting or Diarrhea -Eating and drinking well         Patient has been seen and examined prior to discharge   Objective   Blood pressure 126/71, pulse 72, temperature 98.2 F (36.8 C), temperature source Oral, resp. rate 18, height 5\' 2"  (1.575 m), weight 80.4 kg, SpO2 97%.   Intake/Output Summary (Last 24 hours) at 07/31/2023 1354 Last data filed at 07/31/2023 1200 Gross per 24 hour  Intake 1463.9 ml  Output --  Net 1463.9 ml    Exam Gen:- Awake Alert, no acute distress  HEENT:- .AT, No sclera icterus Neck-Supple Neck,No JVD,.  Lungs-  CTAB , good air movement bilaterally CV- S1, S2 normal, regular Abd-  +ve B.Sounds, Abd Soft, No tenderness,    Extremity/Skin:- No  edema,   good pulses Psych-affect is appropriate, oriented  x3 Neuro-generalized Weakness, no new focal deficits, no tremors    Data Review   CBC w Diff:  Lab Results  Component Value Date   WBC 8.0 07/28/2023   HGB 11.2 (L) 07/28/2023   HGB 11.0 (L) 07/15/2020   HCT 31.9 (L) 07/28/2023   HCT 34.2 07/15/2020   PLT 179 07/28/2023   PLT 335 07/15/2020   LYMPHOPCT 19 07/05/2023   MONOPCT 12 07/05/2023   EOSPCT 5 07/05/2023   BASOPCT 0 07/05/2023    CMP:  Lab Results  Component Value Date   NA 129 (L) 07/31/2023   NA 140 09/06/2022   K 4.3 07/31/2023   CL 100 07/31/2023   CO2 24 07/31/2023   BUN 17 07/31/2023   BUN 26 09/06/2022   CREATININE 1.06 (H) 07/31/2023   PROT 8.0 07/05/2023   PROT 7.2 06/03/2022   ALBUMIN 3.5 07/30/2023   ALBUMIN 4.4 06/03/2022   BILITOT 0.4 07/05/2023   BILITOT 0.3 06/03/2022   ALKPHOS 81 07/05/2023   AST 18 07/05/2023  ALT 13 07/05/2023  .  Total Discharge time is about 33 minutes  [name] M.D on 07/31/2023 at 1:54 PM  Go to [email or web address] -  for contact info  [facility] - Office  [phone]

## 2023-07-31 NOTE — Progress Notes (Signed)
SATURATION QUALIFICATIONS: (This note is used to comply with regulatory documentation for home oxygen)  Patient Saturations on Room Air at Rest = 93%  Patient Saturations on Room Air while Ambulating = 96%  Patient Saturations on 0 Liters of oxygen while Ambulation  was a high 100 %  Please briefly explain why patient needs home oxygen:

## 2023-07-31 NOTE — Discharge Instructions (Signed)
1)Stop Aldactone/spironolactone due to low sodium 2)Decrease losartan HCTZ to half a tablet daily due to low sodium 3)Avoid excessive plain water intake, okay to drink other liquids besides plain water 4)Repeat BMP Blood Test on Friday 08/04/23

## 2023-07-31 NOTE — Progress Notes (Signed)
Patient ambulated in hallway without difficulty, oxygen  at room air saturation was 93 percent at rest,while ambulating oxygen saturation was between 96-100 percent room air. No c/o pain or discomfort noted. Plan of care on going.

## 2023-07-31 NOTE — Care Management Important Message (Signed)
Important Message  Patient Details  Name: Gabrielle [name] MRN: [id number] Date of Birth: 1946-02-27   Medicare Important Message Given:  Yes     Corey Harold 07/31/2023, 10:23 AM

## 2023-08-01 ENCOUNTER — Telehealth: Payer: Self-pay

## 2023-08-01 NOTE — Transitions of Care (Post Inpatient/ED Visit) (Signed)
08/01/2023  Name: Gabrielle [name] MRN: [id number] DOB: [birth year]  Today's TOC FU Call Status: Today's TOC FU Call Status:: Successful TOC FU Call Completed TOC FU Call Complete Date: 08/01/23 Patient's Name and Date of Birth confirmed.  Transition Care Management Follow-up Telephone Call Date of Discharge: 07/31/23 [facility] Type of Discharge: Inpatient Admission Primary Inpatient Discharge Diagnosis:: Hyponatremia How have you been since you were released from the hospital?: Better Any questions or concerns?: No  Items Reviewed: Did you receive and understand the discharge instructions provided?: Yes Medications obtained,verified, and reconciled?: Yes (Medications Reviewed) Any new allergies since your discharge?: No Dietary orders reviewed?: Yes Type of Diet Ordered:: general Do you have support at home?: Yes People in Home: child(ren), adult Name of Support/Comfort Primary Source: [name]  Medications Reviewed Today: Medications Reviewed Today     Reviewed by Jodelle Gross, RN (Case Manager) on 08/01/23 at 1537  Med List Status: <None>   Medication Order Taking? Sig Documenting Provider Last Dose Status Informant  alendronate (FOSAMAX) 70 MG tablet [id number] Yes TAKE ONE TABLET EVERY 7 DAYS ON AN EMPTY STOMACH WITH A FULL GLASS OF WATER [name], DO Taking Active Self, Pharmacy Records  amLODipine (NORVASC) 10 MG tablet [id number] Yes Take 1 tablet (10 mg total) by mouth daily. For blood pressure [name], DO Taking Active Self, Pharmacy Records  atorvastatin (LIPITOR) 20 MG tablet [id number] Yes Take 1 tablet (20 mg total) by mouth daily. For cholesterol [name], DO Taking Active Self, Pharmacy Records  cholecalciferol (VITAMIN D3) 25 MCG (1000 UT) tablet [id number] Yes Take 1,000 Units by mouth daily. [provider] Taking Active Self, Pharmacy Records  CRANBERRY FRUIT PO [id number] Yes Take 1 tablet by  mouth daily. [provider] Taking Active Self, Pharmacy Records  diclofenac Sodium (VOLTAREN) 1 % GEL [id number] Yes APPLY 4 GRAMS TO AFFECTED AREA(S) 4 TIMES A DAY  Patient taking differently: Apply 4 g topically 4 (four) times daily as needed (Pain).   [name], DO Taking Active Self, Pharmacy Records  dorzolamide-timolol (COSOPT) 2-0.5 % ophthalmic solution [id number] Yes INSTILL ONE DROP IN [name] EYE TWICE DAILY [name], MD Taking Active Self, Pharmacy Records  ELIQUIS 5 MG TABS tablet [id number] Yes Take 1 tablet (5 mg total) by mouth 2 (two) times daily. [name], DO Taking Active Self, Pharmacy Records  Ferrous Sulfate (IRON) 325 (65 Fe) MG TABS [id number] Yes Take 1 tablet (325 mg total) by mouth daily. [name], MD Taking Active   fluticasone (FLONASE) 50 MCG/ACT nasal spray [id number] Yes Place 2 sprays into both nostrils daily. [name], FNP Taking Active Self, Pharmacy Records  guaiFENesin-codeine 100-10 MG/5ML syrup [id number] Yes Take 5 mLs by mouth 3 (three) times daily as needed for cough. [name], DO Taking Active Self, Pharmacy Records  hydrALAZINE (APRESOLINE) 25 MG tablet [id number] Yes Take 1 tablet (25 mg total) by mouth 2 (two) times daily. [name], DO Taking Active Self, Pharmacy Records  levocetirizine (XYZAL) 5 MG tablet [id number] Yes Take 5 mg by mouth daily as needed for allergies. [provider] Taking Active Self, Pharmacy Records  losartan-hydrochlorothiazide [facility]) 100-25 MG tablet [id number] Yes Take 0.5 tablets by mouth daily. [name], MD Taking Active   Multiple Vitamins-Minerals (CENTRUM ADULT PO) [id number] Yes Take 1 tablet by mouth daily. [provider] Taking Active Self, Pharmacy Records  Potassium Chloride ER 20 MEQ TBCR [id number] Yes Take 1 tablet (20  mEq total) by mouth daily. Take while taking hydrochlorothiazide/HCTZ [name], MD Taking Active              Home Care and Equipment/Supplies: Were Home Health Services Ordered?: Yes Name of Home Health Agency:: [facility] Has Agency set up a time to come to your home?: No EMR reviewed for Home Health Orders:  (Called patient and will set up date with call back) Any new equipment or medical supplies ordered?: No  Functional Questionnaire: Do you need assistance with bathing/showering or dressing?: No Do you need assistance with meal preparation?: No Do you need assistance with eating?: No Do you have difficulty maintaining continence: No Do you need assistance with getting out of bed/getting out of a chair/moving?: No Do you have difficulty managing or taking your medications?: No  Follow up appointments reviewed: PCP Follow-up appointment confirmed?: Yes Date of PCP follow-up appointment?: 08/02/23 Follow-up Provider: Dr. [name] Specialist [facility] Follow-up appointment confirmed?: Yes Date of Specialist follow-up appointment?: 08/17/23 Follow-Up Specialty Provider:: Dr. [name] Do you need transportation to your follow-up appointment?: No Do you understand care options if your condition(s) worsen?: Yes-patient verbalized understanding  SDOH Interventions Today    Flowsheet Row Most Recent Value  SDOH Interventions   Food Insecurity Interventions Intervention Not Indicated  Housing Interventions Intervention Not Indicated  Transportation Interventions Intervention Not Indicated  Utilities Interventions Intervention Not Indicated      Jodelle Gross RN, BSN, CCM [facility] RN Care Coordinator/ Transitions of Care Direct Dial: [phone]  Fax: [phone]

## 2023-08-02 ENCOUNTER — Ambulatory Visit (INDEPENDENT_AMBULATORY_CARE_PROVIDER_SITE_OTHER): Payer: Medicare HMO | Admitting: Family Medicine

## 2023-08-02 ENCOUNTER — Encounter: Payer: Self-pay | Admitting: Family Medicine

## 2023-08-02 VITALS — BP 115/60 | HR 72 | Temp 98.1°F | Ht 62.0 in | Wt 173.1 lb

## 2023-08-02 DIAGNOSIS — Z09 Encounter for follow-up examination after completed treatment for conditions other than malignant neoplasm: Secondary | ICD-10-CM

## 2023-08-02 DIAGNOSIS — N179 Acute kidney failure, unspecified: Secondary | ICD-10-CM

## 2023-08-02 DIAGNOSIS — E871 Hypo-osmolality and hyponatremia: Secondary | ICD-10-CM

## 2023-08-02 DIAGNOSIS — I10 Essential (primary) hypertension: Secondary | ICD-10-CM

## 2023-08-02 DIAGNOSIS — N1832 Chronic kidney disease, stage 3b: Secondary | ICD-10-CM

## 2023-08-02 LAB — BMP8+EGFR
BUN/Creatinine Ratio: 13 (ref 12–28)
BUN: 19 mg/dL (ref 8–27)
CO2: 23 mmol/L (ref 20–29)
Calcium: 10 mg/dL (ref 8.7–10.3)
Chloride: 97 mmol/L (ref 96–106)
Creatinine, Ser: 1.42 mg/dL — ABNORMAL HIGH (ref 0.57–1.00)
Glucose: 98 mg/dL (ref 70–99)
Potassium: 4.8 mmol/L (ref 3.5–5.2)
Sodium: 134 mmol/L (ref 134–144)
eGFR: 38 mL/min/{1.73_m2} — ABNORMAL LOW (ref >59)

## 2023-08-02 LAB — CULTURE, BLOOD (ROUTINE X 2)
Culture: NO GROWTH
Culture: NO GROWTH
Special Requests: ADEQUATE

## 2023-08-02 NOTE — Progress Notes (Signed)
Established Patient Office Visit  Subjective   Patient ID: Gabrielle [name], female    DOB: 1946/10/23  Age: 77 y.o. MRN: [id number]  Chief Complaint  Patient presents with   Transitions Of Care    HPI  Today's visit was for Transitional Care Management.  The patient was discharged from [facility] on 07/31/23 with a primary diagnosis of  hyponatremia.   Contact with the patient and/or caregiver, by a clinical staff member, was made on 08/01/23 and was documented as a telephone encounter within the EMR.  Through chart review and discussion with the patient I have determined that management of their condition is of high complexity.   She was feeling very weak to the point that she couldn't walk well and fell. Her daughter called 911 and Gabrielle [name] was transported to the ER. She was admitted for hyponatremia and AKI on CKD. At discharged her sodium was at 129 at discharge, up at 114. At discharge, losartan-hydrochlorothiazide was cut in half and spironolactone was discontinued. She was instructed to increased hydration, but limit plain water intake. She is established by nephrology. She has been drinking Gatorade, powerade and orange juice. Denies fatigue, weakness, confusion, HA, nausea, vomiting.    Past Medical History:  Diagnosis Date   Arthritis    Cancer [facility])    Cataract    OU   Colon cancer (HCC)    Family history of pancreatic cancer 08/21/2020   Family history of uterine cancer 08/21/2020   H/O cesarean section    Hx of tonsillectomy    Hyperlipidemia    Hypertension    Kidney disease    Macular degeneration    Exu ARMD OU   Paroxysmal atrial fibrillation (HCC) 07/05/2020   Stroke (cerebrum) (HCC) 07/05/2020   Urinary retention 04/15/2020      ROS As per HPI.    Objective:     BP 115/60   Pulse 72   Temp 98.1 F (36.7 C) (Temporal)   Ht 5\' 2"  (1.575 m)   Wt 173 lb 2 oz (78.5 kg)   SpO2 98%   BMI 31.66 kg/m    Physical Exam Vitals and nursing note  reviewed.  Constitutional:      General: She is not in acute distress.    Appearance: She is not ill-appearing, toxic-appearing or diaphoretic.  HENT:     Mouth/Throat:     Mouth: Mucous membranes are moist.     Pharynx: Oropharynx is clear.  Cardiovascular:     Rate and Rhythm: Normal rate and regular rhythm.     Heart sounds: Normal heart sounds. No murmur heard. Pulmonary:     Effort: Pulmonary effort is normal. No respiratory distress.     Breath sounds: Normal breath sounds. No wheezing.  Abdominal:     General: Bowel sounds are normal. There is no distension.     Palpations: Abdomen is soft.     Tenderness: There is no abdominal tenderness. There is no guarding or rebound.  Musculoskeletal:     Cervical back: Neck supple. No rigidity.     Right lower leg: No edema.     Left lower leg: No edema.  Skin:    General: Skin is warm and dry.  Neurological:     Mental Status: She is alert and oriented to person, place, and time. Mental status is at baseline.      No results found for any visits on 08/02/23.    The ASCVD Risk score (Arnett DK, et al.,  2019) failed to calculate for the following reasons:   The patient has a prior MI or stroke diagnosis    Assessment & Plan:   Gabrielle [name] was seen today for transitions of care.  Diagnoses and all orders for this visit:  Hyponatremia Sodium at 129 at discharge. Denies symptoms. She has been avoiding plain water. Spironolactone has been discontinued and losartan-hydrochlorothiazide cut in half. Will recheck BMP today.  -     BMP8+EGFR  AKI (acute kidney injury) (HCC) BMP pending.  -     BMP8+EGFR  Stage 3b chronic kidney disease (HCC) Established with nephrology. BMP pending.  -     BMP8+EGFR  Primary hypertension BP at goal today. Continue current regimen.  [facility] discharge follow-up Reviewed hospital discharge summary, labs.    Will determine follow up pending labs.   The patient indicates  understanding of these issues and agrees with the plan.  Gabriel Earing, FNP

## 2023-08-03 ENCOUNTER — Other Ambulatory Visit: Payer: Self-pay | Admitting: Family Medicine

## 2023-08-03 ENCOUNTER — Other Ambulatory Visit: Payer: Self-pay

## 2023-08-03 DIAGNOSIS — E871 Hypo-osmolality and hyponatremia: Secondary | ICD-10-CM

## 2023-08-03 DIAGNOSIS — I10 Essential (primary) hypertension: Secondary | ICD-10-CM

## 2023-08-03 MED ORDER — LOSARTAN POTASSIUM 25 MG PO TABS: 25.0000 mg | ORAL_TABLET | Freq: Every day | ORAL | 0 refills | Status: DC

## 2023-08-04 DIAGNOSIS — I129 Hypertensive chronic kidney disease with stage 1 through stage 4 chronic kidney disease, or unspecified chronic kidney disease: Secondary | ICD-10-CM | POA: Diagnosis not present

## 2023-08-04 DIAGNOSIS — E878 Other disorders of electrolyte and fluid balance, not elsewhere classified: Secondary | ICD-10-CM | POA: Diagnosis not present

## 2023-08-04 DIAGNOSIS — I480 Paroxysmal atrial fibrillation: Secondary | ICD-10-CM | POA: Diagnosis not present

## 2023-08-04 DIAGNOSIS — E782 Mixed hyperlipidemia: Secondary | ICD-10-CM | POA: Diagnosis not present

## 2023-08-04 DIAGNOSIS — N179 Acute kidney failure, unspecified: Secondary | ICD-10-CM | POA: Diagnosis not present

## 2023-08-04 DIAGNOSIS — M1990 Unspecified osteoarthritis, unspecified site: Secondary | ICD-10-CM | POA: Diagnosis not present

## 2023-08-04 DIAGNOSIS — E871 Hypo-osmolality and hyponatremia: Secondary | ICD-10-CM | POA: Diagnosis not present

## 2023-08-04 DIAGNOSIS — H353230 Exudative age-related macular degeneration, bilateral, stage unspecified: Secondary | ICD-10-CM | POA: Diagnosis not present

## 2023-08-04 DIAGNOSIS — N1832 Chronic kidney disease, stage 3b: Secondary | ICD-10-CM | POA: Diagnosis not present

## 2023-08-08 ENCOUNTER — Other Ambulatory Visit: Payer: Medicare HMO

## 2023-08-08 DIAGNOSIS — E871 Hypo-osmolality and hyponatremia: Secondary | ICD-10-CM | POA: Diagnosis not present

## 2023-08-08 LAB — BMP8+EGFR
BUN/Creatinine Ratio: 13 (ref 12–28)
BUN: 16 mg/dL (ref 8–27)
CO2: 24 mmol/L (ref 20–29)
Calcium: 10 mg/dL (ref 8.7–10.3)
Chloride: 101 mmol/L (ref 96–106)
Creatinine, Ser: 1.28 mg/dL — ABNORMAL HIGH (ref 0.57–1.00)
Glucose: 99 mg/dL (ref 70–99)
Potassium: 4.5 mmol/L (ref 3.5–5.2)
Sodium: 140 mmol/L (ref 134–144)
eGFR: 43 mL/min/{1.73_m2} — ABNORMAL LOW (ref >59)

## 2023-08-09 DIAGNOSIS — B159 Hepatitis A without hepatic coma: Secondary | ICD-10-CM | POA: Diagnosis not present

## 2023-08-09 DIAGNOSIS — E871 Hypo-osmolality and hyponatremia: Secondary | ICD-10-CM | POA: Diagnosis not present

## 2023-08-09 DIAGNOSIS — R319 Hematuria, unspecified: Secondary | ICD-10-CM | POA: Diagnosis not present

## 2023-08-09 DIAGNOSIS — R809 Proteinuria, unspecified: Secondary | ICD-10-CM | POA: Diagnosis not present

## 2023-08-10 DIAGNOSIS — M1990 Unspecified osteoarthritis, unspecified site: Secondary | ICD-10-CM | POA: Diagnosis not present

## 2023-08-10 DIAGNOSIS — N179 Acute kidney failure, unspecified: Secondary | ICD-10-CM | POA: Diagnosis not present

## 2023-08-10 DIAGNOSIS — E782 Mixed hyperlipidemia: Secondary | ICD-10-CM | POA: Diagnosis not present

## 2023-08-10 DIAGNOSIS — I129 Hypertensive chronic kidney disease with stage 1 through stage 4 chronic kidney disease, or unspecified chronic kidney disease: Secondary | ICD-10-CM | POA: Diagnosis not present

## 2023-08-10 DIAGNOSIS — I480 Paroxysmal atrial fibrillation: Secondary | ICD-10-CM | POA: Diagnosis not present

## 2023-08-10 DIAGNOSIS — E878 Other disorders of electrolyte and fluid balance, not elsewhere classified: Secondary | ICD-10-CM | POA: Diagnosis not present

## 2023-08-10 DIAGNOSIS — N1832 Chronic kidney disease, stage 3b: Secondary | ICD-10-CM | POA: Diagnosis not present

## 2023-08-10 DIAGNOSIS — E871 Hypo-osmolality and hyponatremia: Secondary | ICD-10-CM | POA: Diagnosis not present

## 2023-08-10 DIAGNOSIS — H353230 Exudative age-related macular degeneration, bilateral, stage unspecified: Secondary | ICD-10-CM | POA: Diagnosis not present

## 2023-08-11 ENCOUNTER — Ambulatory Visit (INDEPENDENT_AMBULATORY_CARE_PROVIDER_SITE_OTHER): Payer: Medicare HMO

## 2023-08-11 DIAGNOSIS — N179 Acute kidney failure, unspecified: Secondary | ICD-10-CM

## 2023-08-11 DIAGNOSIS — E878 Other disorders of electrolyte and fluid balance, not elsewhere classified: Secondary | ICD-10-CM

## 2023-08-11 DIAGNOSIS — M1990 Unspecified osteoarthritis, unspecified site: Secondary | ICD-10-CM

## 2023-08-11 DIAGNOSIS — I480 Paroxysmal atrial fibrillation: Secondary | ICD-10-CM

## 2023-08-11 DIAGNOSIS — E782 Mixed hyperlipidemia: Secondary | ICD-10-CM | POA: Diagnosis not present

## 2023-08-11 DIAGNOSIS — E871 Hypo-osmolality and hyponatremia: Secondary | ICD-10-CM

## 2023-08-11 DIAGNOSIS — N1832 Chronic kidney disease, stage 3b: Secondary | ICD-10-CM

## 2023-08-11 DIAGNOSIS — I129 Hypertensive chronic kidney disease with stage 1 through stage 4 chronic kidney disease, or unspecified chronic kidney disease: Secondary | ICD-10-CM | POA: Diagnosis not present

## 2023-08-14 DIAGNOSIS — E878 Other disorders of electrolyte and fluid balance, not elsewhere classified: Secondary | ICD-10-CM | POA: Diagnosis not present

## 2023-08-14 DIAGNOSIS — M1990 Unspecified osteoarthritis, unspecified site: Secondary | ICD-10-CM | POA: Diagnosis not present

## 2023-08-14 DIAGNOSIS — N1832 Chronic kidney disease, stage 3b: Secondary | ICD-10-CM | POA: Diagnosis not present

## 2023-08-14 DIAGNOSIS — H353230 Exudative age-related macular degeneration, bilateral, stage unspecified: Secondary | ICD-10-CM | POA: Diagnosis not present

## 2023-08-14 DIAGNOSIS — E782 Mixed hyperlipidemia: Secondary | ICD-10-CM | POA: Diagnosis not present

## 2023-08-14 DIAGNOSIS — I480 Paroxysmal atrial fibrillation: Secondary | ICD-10-CM | POA: Diagnosis not present

## 2023-08-14 DIAGNOSIS — E871 Hypo-osmolality and hyponatremia: Secondary | ICD-10-CM | POA: Diagnosis not present

## 2023-08-14 DIAGNOSIS — N179 Acute kidney failure, unspecified: Secondary | ICD-10-CM | POA: Diagnosis not present

## 2023-08-14 DIAGNOSIS — I129 Hypertensive chronic kidney disease with stage 1 through stage 4 chronic kidney disease, or unspecified chronic kidney disease: Secondary | ICD-10-CM | POA: Diagnosis not present

## 2023-08-16 NOTE — Progress Notes (Signed)
[facility] [address], [place] [address]    Clinic Day:  08/17/23    Referring physician: [name], DO  Patient Care Team: [name], DO as PCP - General (Family Medicine) [name], MD as Consulting Physician (Retina Ophthalmology) Fabio Pierce, MD as Consulting Physician (Ophthalmology) West Bali, MD (Inactive) as Consulting Physician (Gastroenterology) Doreatha Massed, MD as Medical Oncologist (Oncology)   ASSESSMENT & PLAN:   Assessment: 1.  Stage IIIb (T4AN1A) poorly differentiated right colon adenocarcinoma: -Right hemicolectomy on 04/07/2020 with poorly differentiated adenocarcinoma, pT4a, positive radial margin, 1/15 lymph nodes positive, loss of MLH1 and PMS2, bladder biopsy negative for malignancy. -CTAP on 04/06/2020 showed circumferential ascending colon mass with numerous borderline enlarged pericolonic lymph nodes.  No findings of hepatic metastatic disease. -CEA on 04/04/2020 was 12.2.  CEA improved to 1.7 on 05/13/2020. -PET scan on 05/11/2020 shows mild FDG uptake associated with the anastomotic site.  Small pulmonary nodules that do not show elevated FDG activity, remain nonspecific, subcentimeter.  Persistent but improved bladder wall thickening. -Cycle 1 of dose reduced FOLFOX on 05/19/2020, followed by 2 hospitalizations, 1 from acute kidney injury from diarrhea and second admission for C. difficile colitis. -Cycle 2 of 5-FU and leucovorin dose reduced on 06/30/2020.  Chemotherapy discontinued secondary to intolerance. -Follow-up CT scan on 11/02/2020 with multiple soft tissue lesions in the central and right mesentery measuring up to 3.1 x 1.7 cm.  Mild lymphadenopathy in the hepatic duodenal ligament, retroperitoneal space, right common iliac chain, bilateral external iliac chains.  17 mm soft tissue nodule in the lower anterior abdominal wall.  Bilateral tiny pulmonary nodules not substantially changed.  2.9 x 2.5  cm low-density lesion along the posterior uterus is indeterminate. -Pembrolizumab started on 11/10/2020. -CT CAP from 02/09/2021 showed interval near complete resolution of previously demonstrated nodal mass in the ileocolonic mesentery.  Residual ill-defined nodule medial to the tip of the right hepatic lobe.  Stable small pulmonary nodules bilaterally consistent with benign findings.   2.  Family history: -Paternal uncle had colon cancer.  Maternal aunt had brain cancer and another maternal aunt had gynecological malignancy.  Father had lung cancer and was a smoker. - Genetic testing did not reveal any notable mutations.   3.  Diffuse erythema and nodularity of the dome of the bladder: -CT scan showed severely thickened bladder wall. -Cystoscopy on 04/08/2020 showed diffuse erythema, nodularity in the posterior wall tracking to the dome.  Ureteral orifices were in normal locations.  Biopsies were benign.    Plan: 1.  Stage IV right colon adenocarcinoma, MSI-high: - CT CAP on 06/12/2023: Stable multiple bilateral lung nodules with no new metastatic disease. - She does not report any immunotherapy related side effects. - Reviewed labs: Creatinine 1.36.  LFTs are stable.  CBC grossly normal. - Continue Keytruda every 3 weeks.  RTC 6 weeks for follow-up.  Will plan on repeating CT CAP and CEA level prior to next visit.   2.  Paroxysmal atrial fibrillation: - Continue Eliquis indefinitely.  No bleeding issues.   3.  Hypertension: - Continue Hyzaar and amlodipine.  Blood pressure is 140/63.  4.  Hypokalemia: - Continue potassium 20 mill equivalents daily.  Potassium today is 3.6.    Orders Placed This Encounter  Procedures   CT CHEST ABDOMEN PELVIS W CONTRAST    Standing Status:   Future    Standing Expiration Date:   08/16/2024    Order Specific Question:  If indicated for the ordered procedure, I authorize the administration of contrast media per Radiology protocol    Answer:   Yes     Order Specific Question:   Does the patient have a contrast media/X-ray dye allergy?    Answer:   No    Order Specific Question:   Preferred imaging location?    Answer:   [facility]    Order Specific Question:   If indicated for the ordered procedure, I authorize the administration of oral contrast media per Radiology protocol    Answer:   Yes   CEA    Standing Status:   Future    Number of Occurrences:   1    Standing Expiration Date:   08/16/2024   CEA    Standing Status:   Standing    Number of Occurrences:   10    Standing Expiration Date:   08/16/2024       Alben Deeds Teague,acting as a scribe for Doreatha Massed, MD.,have documented all relevant documentation on the behalf of Doreatha Massed, MD,as directed by  Doreatha Massed, MD while in the presence of Doreatha Massed, MD.  I, Doreatha Massed MD, have reviewed the above documentation for accuracy and completeness, and I agree with the above.     Doreatha Massed, MD   9/12/20245:35 PM  CHIEF COMPLAINT:   Diagnosis: right colon cancer    Cancer Staging  Malignant neoplasm of colon [facility]) Staging form: Colon and Rectum, AJCC 8th Edition - Clinical stage from 04/27/2020: Stage IIIB (cT4a, cN1a, cM0) - Unsigned - Pathologic stage from 11/05/2020: Stage IVC (rpTX, pN0, pM1c) - Signed by Doreatha Massed, MD on 11/05/2020    Prior Therapy: Rande Lawman every 3 weeks   Current Therapy:  COLORECTAL Pembrolizumab    HISTORY OF PRESENT ILLNESS:   Oncology History  Malignant neoplasm of colon (HCC)  04/07/2020 Initial Diagnosis   Malignant neoplasm of ascending colon (HCC)   05/13/2020 Genetic Testing   [facility]     05/19/2020 - 07/02/2020 Chemotherapy   The patient had palonosetron (ALOXI) injection 0.25 mg, 0.25 mg, Intravenous,  Once, 2 of 12 cycles Administration: 0.25 mg (05/19/2020), 0.25 mg (06/30/2020) leucovorin 522 mg in dextrose 5 % 250 mL infusion, 320 mg/m2 = 522 mg (80 %  of original dose 400 mg/m2), Intravenous,  Once, 2 of 12 cycles Dose modification: 320 mg/m2 (80 % of original dose 400 mg/m2, Cycle 1, Reason: Provider Judgment), 161.0960 mg/m2 (66.7 % of original dose 400 mg/m2, Cycle 2, Reason: Provider Judgment) Administration: 522 mg (05/19/2020), 434 mg (06/30/2020) oxaliplatin (ELOXATIN) 110 mg in dextrose 5 % 500 mL chemo infusion, 68 mg/m2 = 110 mg (80 % of original dose 85 mg/m2), Intravenous,  Once, 1 of 1 cycle Dose modification: 68 mg/m2 (80 % of original dose 85 mg/m2, Cycle 1, Reason: Provider Judgment) Administration: 110 mg (05/19/2020) fluorouracil (ADRUCIL) chemo injection 500 mg, 320 mg/m2 = 500 mg (80 % of original dose 400 mg/m2), Intravenous,  Once, 2 of 12 cycles Dose modification: 320 mg/m2 (80 % of original dose 400 mg/m2, Cycle 1, Reason: Provider Judgment), 454.0981 mg/m2 (66.7 % of original dose 400 mg/m2, Cycle 2, Reason: Provider Judgment) Administration: 500 mg (05/19/2020), 450 mg (06/30/2020) fluorouracil (ADRUCIL) 3,150 mg in sodium chloride 0.9 % 87 mL chemo infusion, 1,920 mg/m2 = 3,150 mg (80 % of original dose 2,400 mg/m2), Intravenous, 1 Day/Dose, 2 of 12 cycles Dose modification: 1,920 mg/m2 (80 % of original dose 2,400 mg/m2, Cycle  1, Reason: Provider Judgment), 1,600 mg/m2 (66.7 % of original dose 2,400 mg/m2, Cycle 2, Reason: Provider Judgment) Administration: 3,150 mg (05/19/2020), 2,600 mg (06/30/2020)  for chemotherapy treatment.    08/03/2020 Genetic Testing   [facility] Reveal Testing     08/14/2020 Genetic Testing   No pathogenic variants detected in [facility] Common Hereditary Cancers Panel.  Variant of uncertain significance (VUS) detected in HOXB13 at c.634G>A (p.Ala212Thr). The Common Hereditary Cancers Panel offered by [facility] includes sequencing and/or deletion duplication testing of the following 48 genes: APC, ATM, AXIN2, BARD1, BMPR1A, BRCA1, BRCA2, BRIP1, CDH1, CDK4, CDKN2A (p14ARF), CDKN2A (p16INK4a), CHEK2,  CTNNA1, DICER1, EPCAM (Deletion/duplication testing only), FLCN, GREM1 (promoter region deletion/duplication testing only), KIT, MEN1, MLH1, MSH2, MSH3, MSH6, MUTYH, NBN, NF1, NHTL1, PALB2, PDGFRA, PMS2, POLD1, POLE, PTEN, RAD50, RAD51C, RAD51D, RNF43, SDHB, SDHC, SDHD, SMAD4, SMARCA4. STK11, TP53, TSC1, TSC2, and VHL.  The following genes were evaluated for sequence changes only: SDHA and HOXB13 c.251G>A variant only. The report date is August 14, 2020.    11/05/2020 Cancer Staging   Staging form: Colon and Rectum, AJCC 8th Edition - Pathologic stage from 11/05/2020: Stage IVC (rpTX, pN0, pM1c) - Signed by Doreatha Massed, MD on 11/05/2020   11/10/2020 - 07/13/2022 Chemotherapy   Patient is on Treatment Plan : COLORECTAL Pembrolizumab q21d     11/10/2020 -  Chemotherapy   Patient is on Treatment Plan : COLORECTAL Pembrolizumab (200) q21d        INTERVAL HISTORY:   Gabrielle [name] is a 77 y.o. female presenting to clinic today for follow up of right colon cancer. She was last seen by me on 06/14/23.  Since her last visit, she was admitted to the ED on 07/28/23 for hyponatremia and a fall. She was told to stop aldactone and decrease hydrochlorothiazide from 25 mg to 12.5 mg. She was recommended to decrease p.o. intake of plain water.   Today, she states that she is doing well overall. Her appetite level is at 100%. Her energy level is at 100%. Patient is accompanied by a family member.  She notes a normal appetite. She denies any diarrhea or abdominal pain. She reports an itchy skin rash on the forearms and chest.   She has an appointment with a hepatologist soon.   PAST MEDICAL HISTORY:   Past Medical History: Past Medical History:  Diagnosis Date   Arthritis    Cancer (HCC)    Cataract    OU   Colon cancer (HCC)    Family history of pancreatic cancer 08/21/2020   Family history of uterine cancer 08/21/2020   H/O cesarean section    Hx of tonsillectomy    Hyperlipidemia    Hypertension     Kidney disease    Macular degeneration    Exu ARMD OU   Paroxysmal atrial fibrillation (HCC) 07/05/2020   Stroke (cerebrum) (HCC) 07/05/2020   Urinary retention 04/15/2020    Surgical History: Past Surgical History:  Procedure Laterality Date   BIOPSY  04/06/2020   Procedure: BIOPSY;  Surgeon: Malissa Hippo, MD;  Location: [facility];  Service: Endoscopy;;   CARPAL TUNNEL RELEASE Right    CATARACT EXTRACTION W/PHACO Left 10/11/2019   Procedure: CATARACT EXTRACTION PHACO AND INTRAOCULAR LENS PLACEMENT (IOC);  Surgeon: Fabio Pierce, MD;  Location: [facility];  Service: Ophthalmology;  Laterality: Left;  CDE: 8.56   CATARACT EXTRACTION W/PHACO Right 10/25/2019   Procedure: CATARACT EXTRACTION PHACO AND INTRAOCULAR LENS PLACEMENT (IOC);  Surgeon: Fabio Pierce, MD;  Location: [facility];  Service: Ophthalmology;  Laterality: Right;  CDE: 5.67   CESAREAN SECTION     COLONOSCOPY N/A 04/06/2020   Procedure: COLONOSCOPY;  Surgeon: Malissa Hippo, MD;  Location: [facility];  Service: Endoscopy;  Laterality: N/A;   CYSTOSCOPY WITH BIOPSY N/A 04/08/2020   Procedure: CYSTOSCOPY WITH BIOPSY;  Surgeon: Malen Gauze, MD;  Location: [facility];  Service: Urology;  Laterality: N/A;   ESOPHAGOGASTRODUODENOSCOPY N/A 04/05/2020   Procedure: ESOPHAGOGASTRODUODENOSCOPY (EGD);  Surgeon: Malissa Hippo, MD;  Location: [facility];  Service: Endoscopy;  Laterality: N/A;   PARTIAL COLECTOMY N/A 04/08/2020   Procedure: PARTIAL COLECTOMY;  Surgeon: Lucretia Roers, MD;  Location: [facility];  Service: General;  Laterality: N/A;   PORTACATH PLACEMENT Left 05/18/2020   Procedure: INSERTION PORT-A-CATH (ATTACHED CATHETER IN LEFT SUBCLAVIAN);  Surgeon: Lucretia Roers, MD;  Location: [facility];  Service: General;  Laterality: Left;   TONSILLECTOMY      Social History: Social History   Socioeconomic History   Marital status: Divorced    Spouse name: Not on file   Number of children: Not on file   Years of  education: Not on file   Highest education level: Not on file  Occupational History   Not on file  Tobacco Use   Smoking status: Never   Smokeless tobacco: Never  Vaping Use   Vaping status: Never Used  Substance and Sexual Activity   Alcohol use: No   Drug use: No   Sexual activity: Not Currently  Other Topics Concern   Not on file  Social History Narrative   Not on file   Social Determinants of Health   Financial Resource Strain: Low Risk  (03/15/2023)   Overall Financial Resource Strain (CARDIA)    Difficulty of Paying Living Expenses: Not hard at all  Food Insecurity: No Food Insecurity (08/01/2023)   Hunger Vital Sign    Worried About Running Out of Food in the Last Year: Never true    Ran Out of Food in the Last Year: Never true  Transportation Needs: No Transportation Needs (08/01/2023)   PRAPARE - Administrator, Civil Service (Medical): No    Lack of Transportation (Non-Medical): No  Physical Activity: Sufficiently Active (03/15/2023)   Exercise Vital Sign    Days of Exercise per Week: 5 days    Minutes of Exercise per Session: 30 min  Stress: No Stress Concern Present (03/15/2023)   [employer] of Occupational Health - Occupational Stress Questionnaire    Feeling of Stress : Not at all  Social Connections: Moderately Integrated (03/15/2023)   Social Connection and Isolation Panel [NHANES]    Frequency of Communication with Friends and Family: More than three times a week    Frequency of Social Gatherings with Friends and Family: More than three times a week    Attends Religious Services: More than 4 times per year    Active Member of [employer] or Organizations: Yes    Attends Banker Meetings: More than 4 times per year    Marital Status: Widowed  Intimate Partner Violence: Not At Risk (07/28/2023)   Humiliation, Afraid, Rape, and Kick questionnaire    Fear of Current or Ex-Partner: No    Emotionally Abused: No    Physically Abused: No     Sexually Abused: No    Family History: Family History  Problem Relation Age of Onset   Macular degeneration Mother    Stroke Mother    Hypertension Mother  Dementia Mother    Lung cancer Father        dx late 32s; smoking hx   Diabetes Sister    Hypertension Sister    Leukemia Paternal Uncle        d. 11s   Cancer Maternal Aunt        ovarian or endometrial dx 33s   Pancreatic cancer Paternal Uncle        d. late 63s    Current Medications:  Current Outpatient Medications:    alendronate (FOSAMAX) 70 MG tablet, TAKE ONE TABLET EVERY 7 DAYS ON AN EMPTY STOMACH WITH A FULL GLASS OF WATER, Disp: 12 tablet, Rfl: 3   amLODipine (NORVASC) 10 MG tablet, Take 1 tablet (10 mg total) by mouth daily. For blood pressure, Disp: 90 tablet, Rfl: 3   atorvastatin (LIPITOR) 20 MG tablet, Take 1 tablet (20 mg total) by mouth daily. For cholesterol, Disp: 90 tablet, Rfl: 3   cholecalciferol (VITAMIN D3) 25 MCG (1000 UT) tablet, Take 1,000 Units by mouth daily., Disp: , Rfl:    CRANBERRY FRUIT PO, Take 1 tablet by mouth daily., Disp: , Rfl:    diclofenac Sodium (VOLTAREN) 1 % GEL, APPLY 4 GRAMS TO AFFECTED AREA(S) 4 TIMES A DAY (Patient taking differently: Apply 4 g topically 4 (four) times daily as needed (Pain).), Disp: 400 g, Rfl: 2   dorzolamide-timolol (COSOPT) 2-0.5 % ophthalmic solution, INSTILL ONE DROP IN EACH EYE TWICE DAILY, Disp: 10 mL, Rfl: 10   ELIQUIS 5 MG TABS tablet, Take 1 tablet (5 mg total) by mouth 2 (two) times daily., Disp: 180 tablet, Rfl: 3   Ferrous Sulfate (IRON) 325 (65 Fe) MG TABS, Take 1 tablet (325 mg total) by mouth daily., Disp: 90 tablet, Rfl: 3   fluticasone (FLONASE) 50 MCG/ACT nasal spray, Place 2 sprays into both nostrils daily., Disp: 16 g, Rfl: 6   hydrALAZINE (APRESOLINE) 25 MG tablet, Take 1 tablet (25 mg total) by mouth 2 (two) times daily., Disp: 180 tablet, Rfl: 3   Multiple Vitamins-Minerals (CENTRUM ADULT PO), Take 1 tablet by mouth daily., Disp: ,  Rfl:    Potassium Chloride ER 20 MEQ TBCR, Take 1 tablet (20 mEq total) by mouth daily. Take while taking hydrochlorothiazide/HCTZ, Disp: 30 tablet, Rfl: 1   guaiFENesin-codeine 100-10 MG/5ML syrup, Take 5 mLs by mouth 3 (three) times daily as needed for cough. (Patient not taking: Reported on 08/17/2023), Disp: 120 mL, Rfl: 0 No current facility-administered medications for this visit.  Facility-Administered Medications Ordered in Other Visits:    sodium chloride flush (NS) 0.9 % injection 10 mL, 10 mL, Intracatheter, PRN, Doreatha Massed, MD, 10 mL at 08/17/23 1222   Allergies: Allergies  Allergen Reactions   Lisinopril Cough    REVIEW OF SYSTEMS:   Review of Systems  Constitutional:  Negative for chills, fatigue and fever.  HENT:   Negative for lump/mass, mouth sores, nosebleeds, sore throat and trouble swallowing.   Eyes:  Negative for eye problems.  Respiratory:  Positive for cough. Negative for shortness of breath.   Cardiovascular:  Negative for chest pain, leg swelling and palpitations.  Gastrointestinal:  Negative for abdominal pain, constipation, diarrhea, nausea and vomiting.  Genitourinary:  Negative for bladder incontinence, difficulty urinating, dysuria, frequency, hematuria and nocturia.   Musculoskeletal:  Negative for arthralgias, back pain, flank pain, myalgias and neck pain.  Skin:  Positive for rash (forearms and chest). Negative for itching.  Neurological:  Positive for headaches. Negative for dizziness and numbness.  Hematological:  Does not bruise/bleed easily.  Psychiatric/Behavioral:  Negative for depression, sleep disturbance and suicidal ideas. The patient is not nervous/anxious.   All other systems reviewed and are negative.    VITALS:   Blood pressure (!) 148/63, pulse 69, temperature 98.1 F (36.7 C), temperature source Oral, resp. rate 16, weight 171 lb 9.6 oz (77.8 kg), SpO2 100%.  Wt Readings from Last 3 Encounters:  08/17/23 171 lb 9.6 oz  (77.8 kg)  08/02/23 173 lb 2 oz (78.5 kg)  07/31/23 177 lb 4.8 oz (80.4 kg)    Body mass index is 31.39 kg/m.  Performance status (ECOG): 1 - Symptomatic but completely ambulatory  PHYSICAL EXAM:   Physical Exam Vitals and nursing note reviewed. Exam conducted with a chaperone present.  Constitutional:      Appearance: Normal appearance.  Cardiovascular:     Rate and Rhythm: Normal rate and regular rhythm.     Pulses: Normal pulses.     Heart sounds: Normal heart sounds.  Pulmonary:     Effort: Pulmonary effort is normal.     Breath sounds: Normal breath sounds.  Abdominal:     Palpations: Abdomen is soft. There is no hepatomegaly, splenomegaly or mass.     Tenderness: There is no abdominal tenderness.  Musculoskeletal:     Right lower leg: No edema.     Left lower leg: No edema.  Lymphadenopathy:     Cervical: No cervical adenopathy.     Right cervical: No superficial, deep or posterior cervical adenopathy.    Left cervical: No superficial, deep or posterior cervical adenopathy.     Upper Body:     Right upper body: No supraclavicular or axillary adenopathy.     Left upper body: No supraclavicular or axillary adenopathy.  Neurological:     General: No focal deficit present.     Mental Status: She is alert and oriented to person, place, and time.  Psychiatric:        Mood and Affect: Mood normal.        Behavior: Behavior normal.     LABS:      Latest Ref Rng & Units 08/17/2023    9:05 AM 07/28/2023   11:47 AM 07/05/2023   12:46 PM  CBC  WBC 4.0 - 10.5 K/uL 4.8  8.0  7.4   Hemoglobin 12.0 - 15.0 g/dL 16.1  09.6  04.5   Hematocrit 36.0 - 46.0 % 37.5  31.9  36.0   Platelets 150 - 400 K/uL 218  179  181       Latest Ref Rng & Units 08/17/2023    9:05 AM 08/08/2023   10:28 AM 08/02/2023    9:00 AM  CMP  Glucose 70 - 99 mg/dL 409  99  98   BUN 8 - 23 mg/dL 15  16  19    Creatinine 0.44 - 1.00 mg/dL 8.11  9.14  7.82   Sodium 135 - 145 mmol/L 135  140  134    Potassium 3.5 - 5.1 mmol/L 3.6  4.5  4.8   Chloride 98 - 111 mmol/L 98  101  97   CO2 22 - 32 mmol/L 25  24  23    Calcium 8.9 - 10.3 mg/dL 9.8  95.6  21.3   Total Protein 6.5 - 8.1 g/dL 8.2     Total Bilirubin 0.3 - 1.2 mg/dL 0.5     Alkaline Phos 38 - 126 U/L 81     AST 15 - 41  U/L 18     ALT 0 - 44 U/L 13        Lab Results  Component Value Date   CEA1 2.4 03/01/2023   /  CEA  Date Value Ref Range Status  03/01/2023 2.4 0.0 - 4.7 ng/mL Final    Comment:    (NOTE)                             Nonsmokers          <3.9                             Smokers             <5.6 Roche [employer] Electrochemiluminescence Immunoassay (ECLIA) Values obtained with different assay methods or kits cannot be used interchangeably.  Results cannot be interpreted as absolute evidence of the presence or absence of malignant disease. Performed At: [facility] [address] [place], [place] [id number] Jolene Schimke MD WU:[id number]    No results found for: "PSA1" No results found for: "CAN199" No results found for: "CAN125"  No results found for: "TOTALPROTELP", "ALBUMINELP", "A1GS", "A2GS", "BETS", "BETA2SER", "GAMS", "MSPIKE", "SPEI" Lab Results  Component Value Date   TIBC 371 03/01/2023   TIBC 396 05/13/2020   TIBC 395 05/13/2020   FERRITIN 52 03/01/2023   FERRITIN 65 05/13/2020   FERRITIN 64 05/13/2020   IRONPCTSAT 18 03/01/2023   IRONPCTSAT 23 05/13/2020   IRONPCTSAT 23 05/13/2020   No results found for: "LDH"   STUDIES:   DG Chest 2 View  Result Date: 07/28/2023 CLINICAL DATA:  Weakness EXAM: CHEST - 2 VIEW COMPARISON:  07/04/2020 FINDINGS: Left-sided Port-A-Cath with the tip unchanged in position. No focal consolidation. No pleural effusion or pneumothorax. Heart and mediastinal contours are unremarkable. No acute osseous abnormality. IMPRESSION: No active cardiopulmonary disease. Electronically Signed   By: Elige Ko M.D.   On: 07/28/2023 12:43

## 2023-08-17 ENCOUNTER — Inpatient Hospital Stay: Payer: Medicare HMO

## 2023-08-17 ENCOUNTER — Inpatient Hospital Stay: Payer: Medicare HMO | Admitting: Hematology

## 2023-08-17 ENCOUNTER — Inpatient Hospital Stay: Payer: Medicare HMO | Attending: Hematology

## 2023-08-17 VITALS — BP 145/65 | HR 69 | Temp 96.9°F | Resp 18

## 2023-08-17 VITALS — BP 148/63 | HR 69 | Temp 98.1°F | Resp 16 | Wt 171.6 lb

## 2023-08-17 DIAGNOSIS — C189 Malignant neoplasm of colon, unspecified: Secondary | ICD-10-CM | POA: Diagnosis not present

## 2023-08-17 DIAGNOSIS — Z5112 Encounter for antineoplastic immunotherapy: Secondary | ICD-10-CM | POA: Insufficient documentation

## 2023-08-17 DIAGNOSIS — Z79899 Other long term (current) drug therapy: Secondary | ICD-10-CM | POA: Insufficient documentation

## 2023-08-17 DIAGNOSIS — C182 Malignant neoplasm of ascending colon: Secondary | ICD-10-CM | POA: Insufficient documentation

## 2023-08-17 LAB — COMPREHENSIVE METABOLIC PANEL
ALT: 13 U/L (ref 0–44)
AST: 18 U/L (ref 15–41)
Albumin: 4.3 g/dL (ref 3.5–5.0)
Alkaline Phosphatase: 81 U/L (ref 38–126)
Anion gap: 12 (ref 5–15)
BUN: 15 mg/dL (ref 8–23)
CO2: 25 mmol/L (ref 22–32)
Calcium: 9.8 mg/dL (ref 8.9–10.3)
Chloride: 98 mmol/L (ref 98–111)
Creatinine, Ser: 1.36 mg/dL — ABNORMAL HIGH (ref 0.44–1.00)
GFR, Estimated: 40 mL/min — ABNORMAL LOW (ref >60)
Glucose, Bld: 100 mg/dL — ABNORMAL HIGH (ref 70–99)
Potassium: 3.6 mmol/L (ref 3.5–5.1)
Sodium: 135 mmol/L (ref 135–145)
Total Bilirubin: 0.5 mg/dL (ref 0.3–1.2)
Total Protein: 8.2 g/dL — ABNORMAL HIGH (ref 6.5–8.1)

## 2023-08-17 LAB — CBC WITH DIFFERENTIAL/PLATELET
Abs Immature Granulocytes: 0.02 10*3/uL (ref 0.00–0.07)
Basophils Absolute: 0 10*3/uL (ref 0.0–0.1)
Basophils Relative: 1 %
Eosinophils Absolute: 0.2 10*3/uL (ref 0.0–0.5)
Eosinophils Relative: 4 %
HCT: 37.5 % (ref 36.0–46.0)
Hemoglobin: 11.9 g/dL — ABNORMAL LOW (ref 12.0–15.0)
Immature Granulocytes: 0 %
Lymphocytes Relative: 17 %
Lymphs Abs: 0.8 10*3/uL (ref 0.7–4.0)
MCH: 30.8 pg (ref 26.0–34.0)
MCHC: 31.7 g/dL (ref 30.0–36.0)
MCV: 97.2 fL (ref 80.0–100.0)
Monocytes Absolute: 0.4 10*3/uL (ref 0.1–1.0)
Monocytes Relative: 8 %
Neutro Abs: 3.3 10*3/uL (ref 1.7–7.7)
Neutrophils Relative %: 70 %
Platelets: 218 10*3/uL (ref 150–400)
RBC: 3.86 MIL/uL — ABNORMAL LOW (ref 3.87–5.11)
RDW: 14.1 % (ref 11.5–15.5)
WBC: 4.8 10*3/uL (ref 4.0–10.5)
nRBC: 0 % (ref 0.0–0.2)

## 2023-08-17 LAB — MAGNESIUM: Magnesium: 2.2 mg/dL (ref 1.7–2.4)

## 2023-08-17 MED ORDER — SODIUM CHLORIDE 0.9 % IV SOLN
200.0000 mg | Freq: Once | INTRAVENOUS | Status: AC
  Administered 2023-08-17: 200 mg via INTRAVENOUS
  Filled 2023-08-17: qty 8

## 2023-08-17 MED ORDER — SODIUM CHLORIDE 0.9% FLUSH
10.0000 mL | INTRAVENOUS | Status: DC | PRN
  Administered 2023-08-17: 10 mL

## 2023-08-17 MED ORDER — SODIUM CHLORIDE 0.9 % IV SOLN: Freq: Once | INTRAVENOUS | Status: AC

## 2023-08-17 MED ORDER — HEPARIN SOD (PORK) LOCK FLUSH 100 UNIT/ML IV SOLN
500.0000 [IU] | Freq: Once | INTRAVENOUS | Status: AC | PRN
  Administered 2023-08-17: 500 [IU]

## 2023-08-17 NOTE — Progress Notes (Signed)
Patient has been examined by Dr. Katragadda. Vital signs and labs have been reviewed by MD - ANC, Creatinine, LFTs, hemoglobin, and platelets are within treatment parameters per M.D. - pt may proceed with treatment.  Primary RN and pharmacy notified.  

## 2023-08-17 NOTE — Patient Instructions (Signed)
[facility] [place] [facility]  Discharge Instructions: Thank you for choosing [facility] to provide your oncology and hematology care.  If you have a lab appointment with the Cancer Center - please note that after April 8th, 2024, all labs will be drawn in the cancer center.  You do not have to check in or register with the main entrance as you have in the past but will complete your check-in in the cancer center.  Wear comfortable clothing and clothing appropriate for easy access to any Portacath or PICC line.   We strive to give you quality time with your provider. You may need to reschedule your appointment if you arrive late (15 or more minutes).  Arriving late affects you and other patients whose appointments are after yours.  Also, if you miss three or more appointments without notifying the office, you may be dismissed from the clinic at the provider's discretion.      For prescription refill requests, have your pharmacy contact our office and allow 72 hours for refills to be completed.    Today you received the following chemotherapy and/or immunotherapy agents Keytruda      To help prevent nausea and vomiting after your treatment, we encourage you to take your nausea medication as directed.  BELOW ARE SYMPTOMS THAT SHOULD BE REPORTED IMMEDIATELY: *FEVER GREATER THAN 100.4 F (38 C) OR HIGHER *CHILLS OR SWEATING *NAUSEA AND VOMITING THAT IS NOT CONTROLLED WITH YOUR NAUSEA MEDICATION *UNUSUAL SHORTNESS OF BREATH *UNUSUAL BRUISING OR BLEEDING *URINARY PROBLEMS (pain or burning when urinating, or frequent urination) *BOWEL PROBLEMS (unusual diarrhea, constipation, pain near the anus) TENDERNESS IN MOUTH AND THROAT WITH OR WITHOUT PRESENCE OF ULCERS (sore throat, sores in mouth, or a toothache) UNUSUAL RASH, SWELLING OR PAIN  UNUSUAL VAGINAL DISCHARGE OR ITCHING   Items with * indicate a potential emergency and should be followed up as soon as possible or go to the  Emergency Department if any problems should occur.  Please show the CHEMOTHERAPY ALERT CARD or IMMUNOTHERAPY ALERT CARD at check-in to the Emergency Department and triage nurse.  Should you have questions after your visit or need to cancel or reschedule your appointment, please contact [facility] [phone]  and follow the prompts.  Office hours are 8:00 a.m. to 4:30 p.m. Monday - Friday. Please note that voicemails left after 4:00 p.m. may not be returned until the following business day.  We are closed weekends and major holidays. You have access to a nurse at all times for urgent questions. Please call the main number to the clinic [phone] and follow the prompts.  For any non-urgent questions, you may also contact your provider using MyChart. We now offer e-Visits for anyone 18 and older to request care online for non-urgent symptoms. For details visit [email or web address].   Also download the MyChart app! Go to the app store, search "MyChart", open the app, select [place], and log in with your MyChart username and password.   

## 2023-08-17 NOTE — Progress Notes (Signed)
Patient presents today for Keytruda infusion per providers order.  Vital signs and labs reviewed by MD.    Stable during infusion without adverse affects.  Vital signs stable.  No complaints at this time.  Discharge from clinic ambulatory in stable condition.  Alert and oriented X 3.  Follow up with [facility] as scheduled.

## 2023-08-18 LAB — CEA: CEA: 2.1 ng/mL (ref 0.0–4.7)

## 2023-08-21 DIAGNOSIS — N1832 Chronic kidney disease, stage 3b: Secondary | ICD-10-CM | POA: Diagnosis not present

## 2023-08-21 DIAGNOSIS — N179 Acute kidney failure, unspecified: Secondary | ICD-10-CM | POA: Diagnosis not present

## 2023-08-21 DIAGNOSIS — I480 Paroxysmal atrial fibrillation: Secondary | ICD-10-CM | POA: Diagnosis not present

## 2023-08-21 DIAGNOSIS — E871 Hypo-osmolality and hyponatremia: Secondary | ICD-10-CM | POA: Diagnosis not present

## 2023-08-21 DIAGNOSIS — I129 Hypertensive chronic kidney disease with stage 1 through stage 4 chronic kidney disease, or unspecified chronic kidney disease: Secondary | ICD-10-CM | POA: Diagnosis not present

## 2023-08-21 DIAGNOSIS — H353230 Exudative age-related macular degeneration, bilateral, stage unspecified: Secondary | ICD-10-CM | POA: Diagnosis not present

## 2023-08-21 DIAGNOSIS — E878 Other disorders of electrolyte and fluid balance, not elsewhere classified: Secondary | ICD-10-CM | POA: Diagnosis not present

## 2023-08-21 DIAGNOSIS — E782 Mixed hyperlipidemia: Secondary | ICD-10-CM | POA: Diagnosis not present

## 2023-08-21 DIAGNOSIS — M1990 Unspecified osteoarthritis, unspecified site: Secondary | ICD-10-CM | POA: Diagnosis not present

## 2023-08-23 NOTE — Progress Notes (Signed)
[facility] - Clinic Note  08/30/2023    CHIEF COMPLAINT Patient presents for Retina Follow Up  HISTORY OF PRESENT ILLNESS: Gabrielle [name] is a 77 y.o. female who presents to the clinic today for:   HPI     Retina Follow Up   Patient presents with  Wet AMD.  In both eyes.  Severity is moderate.  Duration of 6 weeks.  Since onset it is stable.  I, the attending physician,  performed the HPI with the patient and updated documentation appropriately.        Comments   Pt here for 6 wk ret f/u exu ARMD OU. Pt states VA might be slightly worse.      Last edited by [name], MD on 08/30/2023 10:18 AM.    Patient states   Referring physician: [name], DO [address] [place],  [place] [address]  HISTORICAL INFORMATION:   Selected notes from the MEDICAL RECORD NUMBER Referred by Dr. Mercie Eon for concern of ARMD OU   CURRENT MEDICATIONS: Current Outpatient Medications (Ophthalmic Drugs)  Medication Sig   dorzolamide-timolol (COSOPT) 2-0.5 % ophthalmic solution INSTILL ONE DROP IN [facility] EYE TWICE DAILY   No current facility-administered medications for this visit. (Ophthalmic Drugs)   Current Outpatient Medications (Other)  Medication Sig   alendronate (FOSAMAX) 70 MG tablet TAKE ONE TABLET EVERY 7 DAYS ON AN EMPTY STOMACH WITH A FULL GLASS OF WATER   amLODipine (NORVASC) 10 MG tablet Take 1 tablet (10 mg total) by mouth daily. For blood pressure   atorvastatin (LIPITOR) 20 MG tablet Take 1 tablet (20 mg total) by mouth daily. For cholesterol   cholecalciferol (VITAMIN D3) 25 MCG (1000 UT) tablet Take 1,000 Units by mouth daily.   CRANBERRY FRUIT PO Take 1 tablet by mouth daily.   diclofenac Sodium (VOLTAREN) 1 % GEL APPLY 4 GRAMS TO AFFECTED AREA(S) 4 TIMES A DAY (Patient taking differently: Apply 4 g topically 4 (four) times daily as needed (Pain).)   ELIQUIS 5 MG TABS tablet Take 1 tablet (5 mg total) by mouth 2 (two) times daily.   Ferrous  Sulfate (IRON) 325 (65 Fe) MG TABS Take 1 tablet (325 mg total) by mouth daily.   fluticasone (FLONASE) 50 MCG/ACT nasal spray Place 2 sprays into both nostrils daily.   guaiFENesin-codeine 100-10 MG/5ML syrup Take 5 mLs by mouth 3 (three) times daily as needed for cough.   hydrALAZINE (APRESOLINE) 25 MG tablet Take 1 tablet (25 mg total) by mouth 2 (two) times daily.   Multiple Vitamins-Minerals (CENTRUM ADULT PO) Take 1 tablet by mouth daily.   Potassium Chloride ER 20 MEQ TBCR Take 1 tablet (20 mEq total) by mouth daily. Take while taking hydrochlorothiazide/HCTZ   No current facility-administered medications for this visit. (Other)   REVIEW OF SYSTEMS: ROS   Positive for: Gastrointestinal, Genitourinary, Musculoskeletal, Cardiovascular, Eyes Negative for: Constitutional, Neurological, Skin, HENT, Endocrine, Respiratory, Psychiatric, Allergic/Imm, Heme/Lymph Last edited by Thompson Grayer, COT on 08/30/2023  8:08 AM.     ALLERGIES Allergies  Allergen Reactions   Lisinopril Cough   PAST MEDICAL HISTORY Past Medical History:  Diagnosis Date   Arthritis    Cancer [facility])    Cataract    OU   Colon cancer (HCC)    Family history of pancreatic cancer 08/21/2020   Family history of uterine cancer 08/21/2020   H/O cesarean section    Hx of tonsillectomy    Hyperlipidemia  Hypertension    Kidney disease    Macular degeneration    Exu ARMD OU   Paroxysmal atrial fibrillation (HCC) 07/05/2020   Stroke (cerebrum) (HCC) 07/05/2020   Urinary retention 04/15/2020   Past Surgical History:  Procedure Laterality Date   BIOPSY  04/06/2020   Procedure: BIOPSY;  Surgeon: Malissa Hippo, MD;  Location: [facility];  Service: Endoscopy;;   CARPAL TUNNEL RELEASE Right    CATARACT EXTRACTION W/PHACO Left 10/11/2019   Procedure: CATARACT EXTRACTION PHACO AND INTRAOCULAR LENS PLACEMENT (IOC);  Surgeon: Fabio Pierce, MD;  Location: [facility] ORS;  Service: Ophthalmology;  Laterality: Left;   CDE: 8.56   CATARACT EXTRACTION W/PHACO Right 10/25/2019   Procedure: CATARACT EXTRACTION PHACO AND INTRAOCULAR LENS PLACEMENT (IOC);  Surgeon: Fabio Pierce, MD;  Location: [facility] OR[facility];  Service: Ophthalmology;  Laterality: Right;  CDE: 5.67   CESAREAN SECTION     COLONOSCOPY N/A 04/06/2020   Procedure: COLONOSCOPY;  Surgeon: Malissa Hippo, MD;  Location: [facility];  Service: Endoscopy;  Laterality: N/A;   CYSTOSCOPY WITH BIOPSY N/A 04/08/2020   Procedure: CYSTOSCOPY WITH BIOPSY;  Surgeon: Malen Gauze, MD;  Location: [facility];  Service: Urology;  Laterality: N/A;   ESOPHAGOGASTRODUODENOSCOPY N/A 04/05/2020   Procedure: ESOPHAGOGASTRODUODENOSCOPY (EGD);  Surgeon: Malissa Hippo, MD;  Location: [facility];  Service: Endoscopy;  Laterality: N/A;   PARTIAL COLECTOMY N/A 04/08/2020   Procedure: PARTIAL COLECTOMY;  Surgeon: Lucretia Roers, MD;  Location: [facility] OR[facility];  Service: General;  Laterality: N/A;   PORTACATH PLACEMENT Left 05/18/2020   Procedure: INSERTION PORT-A-CATH (ATTACHED CATHETER IN LEFT SUBCLAVIAN);  Surgeon: Lucretia Roers, MD;  Location: [facility];  Service: General;  Laterality: Left;   TONSILLECTOMY     FAMILY HISTORY Family History  Problem Relation Age of Onset   Macular degeneration Mother    Stroke Mother    Hypertension Mother    Dementia Mother    Lung cancer Father        dx late 18s; smoking hx   Diabetes Sister    Hypertension Sister    Leukemia Paternal Uncle        d. 36s   Cancer Maternal Aunt        ovarian or endometrial dx 23s   Pancreatic cancer Paternal Uncle        d. late 84s   SOCIAL HISTORY Social History   Tobacco Use   Smoking status: Never   Smokeless tobacco: Never  Vaping Use   Vaping status: Never Used  Substance Use Topics   Alcohol use: No   Drug use: No       OPHTHALMIC EXAM: Base Eye Exam     Visual Acuity (Snellen - Linear)       Right Left   Dist cc CF at 3' 20/70 -2   Dist ph cc NI NI    Correction: Glasses          Tonometry (Tonopen, 8:12 AM)       Right Left   Pressure 17 15         Pupils       Pupils Dark Light Shape React APD   Right PERRL 3 2 Round Brisk None   Left PERRL 3 2 Round Brisk None         Visual Fields       Left Right    Full Full         Extraocular Movement  Right Left    Full, Ortho Full, Ortho         Neuro/Psych     Oriented x3: Yes   Mood/Affect: Normal         Dilation     Both eyes: 1.0% Mydriacyl, 2.5% Phenylephrine @ 8:13 AM           Slit Lamp and Fundus Exam     Slit Lamp Exam       Right Left   Lids/Lashes Dermatochalasis - upper lid Dermatochalasis - upper lid   Conjunctiva/Sclera 1+ Injection Trace Injection   Cornea Arcus, 3+ fine Punctpate epithelial erosions Arcus, 3+ fine Punctate epithelial erosions, mild tear film debris   Anterior Chamber Deep and quiet Deep and quiet   Iris Round and well dilated Round and well dilated   Lens PCIOL in good position, 1+ Posterior capsular opacification PC IOL in good position with open PC   Anterior Vitreous Vitreous syneresis, Posterior vitreous detachment, vitreous condensations Vitreous syneresis, Posterior vitreous detachment, silicone oil micro drops         Fundus Exam       Right Left   Disc pink and sharp, compact mild pallor, sharp rim, Compact, vascular loops superiorly   C/D Ratio 0.2 0.2   Macula Blunted foveal reflex, large central GA, Drusen, Pigment clumping and atrophy, central thickening/pigmented disciform scar, +PED, punctate IRH superior macula within atrophy -- improved, persistent trace cystic changes overlying central scar Blunted foveal reflex, central thickening, RPE clumping and atrophy, Drusen, trace cystic changes overlying PED, no heme   Vessels Vascular attenuation, Tortuous Vascular attenuation, Tortuous   Periphery Attached, mild Reticular degeneration Attached, mild Reticular degeneration           Refraction     Wearing  Rx       Sphere Cylinder Add   Right [place] Sphere +3.50   Left -0.50 Sphere +3.50    Type: PAL           IMAGING AND PROCEDURES  Imaging and Procedures for 03/21/18  OCT, Retina - OU - Both Eyes       Right Eye Quality was good. Central Foveal Thickness: 429. Progression has worsened. Findings include no SRF, abnormal foveal contour, retinal drusen , outer retinal tubulation, subretinal hyper-reflective material, disciform scar, epiretinal membrane, intraretinal fluid, pigment epithelial detachment, outer retinal atrophy (Mild interval increase in cystic changes overlying stable central SRHM / disciform scar).   Left Eye Quality was good. Central Foveal Thickness: 274. Progression has been stable. Findings include no SRF, abnormal foveal contour, retinal drusen , subretinal hyper-reflective material, intraretinal fluid, pigment epithelial detachment, outer retinal atrophy (Trace cystic changes nasal fovea and macula overlying PED/SRHM ).   Notes *Images captured and stored on drive  Diagnosis / Impression:  Exudative ARMD OU OD: Mild interval increase in cystic changes overlying stable central SRHM / disciform scar OS: trace cystic changes nasal fovea and macula overlying PED/SRHM  Clinical management:  See below  Abbreviations: NFP - Normal foveal profile. CME - cystoid macular edema. PED - pigment epithelial detachment. IRF - intraretinal fluid. SRF - subretinal fluid. EZ - ellipsoid zone. ERM - epiretinal membrane. ORA - outer retinal atrophy. ORT - outer retinal tubulation. SRHM - subretinal hyper-reflective material      Intravitreal Injection, Pharmacologic Agent - OD - Right Eye       Time Out 08/30/2023. 8:33 AM. Confirmed correct patient, procedure, site, and patient consented.   Anesthesia Topical anesthesia was used. Anesthetic  medications included Lidocaine 2%, Proparacaine 0.5%.   Procedure Preparation included 5% betadine to ocular surface, eyelid  speculum. A (33g) needle was used.   Injection: 2 mg aflibercept 2 MG/0.05ML   Route: Intravitreal, Site: Right Eye   NDC: L6038910, Lot: [id number], Expiration date: 11/03/2024, Waste: 0 mL   Post-op Post injection exam found visual acuity of at least counting fingers. The patient tolerated the procedure well. There were no complications. The patient received written and verbal post procedure care education.      Intravitreal Injection, Pharmacologic Agent - OS - Left Eye       Time Out 08/30/2023. 8:33 AM. Confirmed correct patient, procedure, site, and patient consented.   Anesthesia Topical anesthesia was used. Anesthetic medications included Lidocaine 2%, Proparacaine 0.5%.   Procedure Preparation included 5% betadine to ocular surface, eyelid speculum. A (32g) needle was used.   Injection: 6 mg faricimab-svoa 6 MG/0.05ML   Route: Intravitreal, Site: Left Eye   NDC: O8010301, Lot: [id number], Expiration date: 08/04/2025, Waste: 0 mL   Post-op Post injection exam found visual acuity of at least counting fingers. The patient tolerated the procedure well. There were no complications. The patient received written and verbal post procedure care education. Post injection medications were not given.            ASSESSMENT/PLAN:    ICD-10-CM   1. Exudative age-related macular degeneration of both eyes with active choroidal neovascularization (HCC)  H35.3231 OCT, Retina - OU - Both Eyes    Intravitreal Injection, Pharmacologic Agent - OD - Right Eye    Intravitreal Injection, Pharmacologic Agent - OS - Left Eye    faricimab-svoa (VABYSMO) 6mg /0.59mL intravitreal injection    aflibercept (EYLEA) SOLN 2 mg    2. Posterior vitreous detachment of both eyes  H43.813     3. Essential hypertension  I10     4. Hypertensive retinopathy of both eyes  H35.033     5. Pseudophakia of both eyes  Z96.1     6. Ocular hypertension, bilateral  H40.053     7. Dry eyes  H04.123       1. Exudative age-related macular degeneration, both eyes - severe exudative disease with very active CNVM OU at presentation in January 2019  - S/P IVA OD #1 (01.04.19), #2 (02.15.19)  - S/P IVA OS #1 (01.18.19), #2 (02.15.19)  - switched to Eylea 3.18.19 due to severity of disease - S/P IVE OD #1 (03.18.19), #2 (04.17.19), #3 (05.15.19), #4 (10.02.19),  #5 (01.15.20), #6 (06.04.20), #7 (10.29.20), #8 (01.21.21), #9 (05.27.21), #10 (07.22.21) -- injections held due to stable disciform scar, #11 (08.12.24) for new IRH - S/P IVE OS #1 (03.18.19), #2 (04.17.19), #3 (05.15.19), #4 (06.12.19), #5 (07.10.19), #6 (08.07.19), #7 (09.04.19), #8 (10.02.19), #9 (11.06.19), #10 (12.11.19), #11 (01.15.20), #12 (02.20.20), #13 (03.26.20), #14 (04.30.20), #15 (06.04.20), #16 (07.16.20), # 17 (08.20.20), #18 (09.24.20), #19 (10.29.20), #20 (12.03.20), #21 (01.21.21), #22 (03.18.21), #23 (05.27.21), #24 (07.22.21), #25 (11.11.21), #26 (12.09.21), #27 (01.06.22), #28 (2.7.22), #29 (03.24.22), #30 (4.28.22), #31 (05.31.22), #32 (06.30.22), #33 (08.04.22), #34 (09.15.22), #35 (10.27.22), #36 (12.08.22), #37 (01.12.23), #38 (02.16.23), #39 (03.30.23), #40 (05.11.23), #41 (06.22.23), #42 (07.27.23), #43 (09.01.23), #44 (10.06.23), #45 (11.10.23), #46 (12.15.23) -- IVE resistance  - s/p IVV OS #1 (01.12.24 -- sample), #2 (02.09.24), #3 (03.15.24), #4 (04.19.24), #5 (05.24.24), #6 (07.01.24), #7 (08.12.24)  - IVE OS held from 7.22.21 to 11.11.21 due to TIA/stroke on 8.1.21 - OCT OD: OD: Mild interval increase in cystic changes overlying stable  central SRHM / disciform scar; OS: trace cystic changes nasal fovea and macula overlying PED/SRHM at 6 weeks  - exam OD with focal IRH superior macula within atrophy -- slightly increased  - BCVA OD stable at CF 3'; OS stable at 20/70   - recommend IVE OD #12 and IVV OS #8 today, 09.25.24 w/ f/u at 6 wks again - IVV authorization obtained - RBA of procedure discussed, questions  answered - IVV informed consent obtained and signed on 01.12.24 - IVE informed consent obtained and signed on 08.12.24 - see procedure note   - Good Days approved for 2024 - f/u in 6 weeks, sooner prn for DFE/OCT/possible injection(s)  2. PVD / vitreous syneresis  - Discussed findings and prognosis   - No RT or RD on 360 exam  - Reviewed s/s of RT/RD  - strict return precautions for any such RT/RD symptoms  3,4. Hypertensive retinopathy OU - exam shows persistent focal IRH OD within GA - discussed importance of tight BP control - monitor  5. Pseudophakia OU  - s/p CE/IOL (Dr. June Leap, 11.2020)  - beautiful surgeries w/ IOLs in excellent position, doing well  - monitor  6. Ocular Hypertension OU  - IOP 17,15  - continue cosopt bid OU  7. Dry eyes OU - recommend artificial tears and lubricating ointment as needed  Ophthalmic Meds Ordered this visit:  Meds ordered this encounter  Medications   faricimab-svoa (VABYSMO) 6mg /0.59mL intravitreal injection   aflibercept (EYLEA) SOLN 2 mg     Return in about 6 weeks (around 10/11/2023) for f/u exu ARMD OU, DFE, OCT.  There are no Patient Instructions on file for this visit.  This document serves as a record of services personally performed by Karie Chimera, MD, PhD. It was created on their behalf by De Blanch, an ophthalmic technician. The creation of this record is the provider's dictation and/or activities during the visit.    Electronically signed by: De Blanch, OA, 08/30/23  12:35 PM  This document serves as a record of services personally performed by Karie Chimera, MD, PhD. It was created on their behalf by Glee Arvin. Manson Passey, OA an ophthalmic technician. The creation of this record is the provider's dictation and/or activities during the visit.    Electronically signed by: Glee Arvin. Kristopher Oppenheim 08/30/23 12:35 PM  Karie Chimera, M.D., Ph.D. Diseases & Surgery of the Retina and Vitreous [facility] 08/30/2023   I have reviewed the above documentation for accuracy and completeness, and I agree with the above. Karie Chimera, M.D., Ph.D. 08/30/23 12:53 PM  Abbreviations: M myopia (nearsighted); A astigmatism; H hyperopia (farsighted); P presbyopia; Mrx spectacle prescription;  CTL contact lenses; OD right eye; OS left eye; OU both eyes  XT exotropia; ET esotropia; PEK punctate epithelial keratitis; PEE punctate epithelial erosions; DES dry eye syndrome; MGD meibomian gland dysfunction; ATs artificial tears; PFAT's preservative free artificial tears; NSC nuclear sclerotic cataract; PSC posterior subcapsular cataract; ERM epi-retinal membrane; PVD posterior vitreous detachment; RD retinal detachment; DM diabetes mellitus; DR diabetic retinopathy; NPDR non-proliferative diabetic retinopathy; PDR proliferative diabetic retinopathy; CSME clinically significant macular edema; DME diabetic macular edema; dbh dot blot hemorrhages; CWS cotton wool spot; POAG primary open angle glaucoma; C/D cup-to-disc ratio; HVF humphrey visual field; GVF goldmann visual field; OCT optical coherence tomography; IOP intraocular pressure; BRVO Branch retinal vein occlusion; CRVO central retinal vein occlusion; CRAO central retinal artery occlusion; BRAO branch retinal artery occlusion; RT retinal tear;  SB scleral buckle; PPV pars plana vitrectomy; VH Vitreous hemorrhage; PRP panretinal laser photocoagulation; IVK intravitreal kenalog; VMT vitreomacular traction; MH Macular hole;  NVD neovascularization of the disc; NVE neovascularization elsewhere; AREDS age related eye disease study; ARMD age related macular degeneration; POAG primary open angle glaucoma; EBMD epithelial/anterior basement membrane dystrophy; ACIOL anterior chamber intraocular lens; IOL intraocular lens; PCIOL posterior chamber intraocular lens; Phaco/IOL phacoemulsification with intraocular lens placement; PRK photorefractive keratectomy; LASIK  laser assisted in situ keratomileusis; HTN hypertension; DM diabetes mellitus; COPD chronic obstructive pulmonary disease

## 2023-08-28 DIAGNOSIS — E871 Hypo-osmolality and hyponatremia: Secondary | ICD-10-CM | POA: Diagnosis not present

## 2023-08-28 DIAGNOSIS — E878 Other disorders of electrolyte and fluid balance, not elsewhere classified: Secondary | ICD-10-CM | POA: Diagnosis not present

## 2023-08-28 DIAGNOSIS — I480 Paroxysmal atrial fibrillation: Secondary | ICD-10-CM | POA: Diagnosis not present

## 2023-08-28 DIAGNOSIS — E782 Mixed hyperlipidemia: Secondary | ICD-10-CM | POA: Diagnosis not present

## 2023-08-28 DIAGNOSIS — N1832 Chronic kidney disease, stage 3b: Secondary | ICD-10-CM | POA: Diagnosis not present

## 2023-08-28 DIAGNOSIS — I129 Hypertensive chronic kidney disease with stage 1 through stage 4 chronic kidney disease, or unspecified chronic kidney disease: Secondary | ICD-10-CM | POA: Diagnosis not present

## 2023-08-28 DIAGNOSIS — H353230 Exudative age-related macular degeneration, bilateral, stage unspecified: Secondary | ICD-10-CM | POA: Diagnosis not present

## 2023-08-28 DIAGNOSIS — M1990 Unspecified osteoarthritis, unspecified site: Secondary | ICD-10-CM | POA: Diagnosis not present

## 2023-08-28 DIAGNOSIS — N179 Acute kidney failure, unspecified: Secondary | ICD-10-CM | POA: Diagnosis not present

## 2023-08-30 ENCOUNTER — Encounter (INDEPENDENT_AMBULATORY_CARE_PROVIDER_SITE_OTHER): Payer: Self-pay | Admitting: Ophthalmology

## 2023-08-30 ENCOUNTER — Ambulatory Visit (INDEPENDENT_AMBULATORY_CARE_PROVIDER_SITE_OTHER): Payer: Medicare HMO | Admitting: Ophthalmology

## 2023-08-30 DIAGNOSIS — H353231 Exudative age-related macular degeneration, bilateral, with active choroidal neovascularization: Secondary | ICD-10-CM

## 2023-08-30 DIAGNOSIS — H43813 Vitreous degeneration, bilateral: Secondary | ICD-10-CM | POA: Diagnosis not present

## 2023-08-30 DIAGNOSIS — H04123 Dry eye syndrome of bilateral lacrimal glands: Secondary | ICD-10-CM | POA: Diagnosis not present

## 2023-08-30 DIAGNOSIS — I10 Essential (primary) hypertension: Secondary | ICD-10-CM | POA: Diagnosis not present

## 2023-08-30 DIAGNOSIS — Z961 Presence of intraocular lens: Secondary | ICD-10-CM

## 2023-08-30 DIAGNOSIS — H40053 Ocular hypertension, bilateral: Secondary | ICD-10-CM | POA: Diagnosis not present

## 2023-08-30 DIAGNOSIS — H35033 Hypertensive retinopathy, bilateral: Secondary | ICD-10-CM

## 2023-08-30 MED ORDER — FARICIMAB-SVOA 6 MG/0.05ML IZ SOLN
6.0000 mg | INTRAVITREAL | Status: AC | PRN
Start: 2023-08-30 — End: 2023-08-30
  Administered 2023-08-30: 6 mg via INTRAVITREAL

## 2023-08-30 MED ORDER — AFLIBERCEPT 2MG/0.05ML IZ SOLN FOR KALEIDOSCOPE
2.0000 mg | INTRAVITREAL | Status: AC | PRN
Start: 2023-08-30 — End: 2023-08-30
  Administered 2023-08-30: 2 mg via INTRAVITREAL

## 2023-09-04 DIAGNOSIS — H353230 Exudative age-related macular degeneration, bilateral, stage unspecified: Secondary | ICD-10-CM | POA: Diagnosis not present

## 2023-09-04 DIAGNOSIS — E878 Other disorders of electrolyte and fluid balance, not elsewhere classified: Secondary | ICD-10-CM | POA: Diagnosis not present

## 2023-09-04 DIAGNOSIS — I129 Hypertensive chronic kidney disease with stage 1 through stage 4 chronic kidney disease, or unspecified chronic kidney disease: Secondary | ICD-10-CM | POA: Diagnosis not present

## 2023-09-04 DIAGNOSIS — E782 Mixed hyperlipidemia: Secondary | ICD-10-CM | POA: Diagnosis not present

## 2023-09-04 DIAGNOSIS — N179 Acute kidney failure, unspecified: Secondary | ICD-10-CM | POA: Diagnosis not present

## 2023-09-04 DIAGNOSIS — I480 Paroxysmal atrial fibrillation: Secondary | ICD-10-CM | POA: Diagnosis not present

## 2023-09-04 DIAGNOSIS — E871 Hypo-osmolality and hyponatremia: Secondary | ICD-10-CM | POA: Diagnosis not present

## 2023-09-04 DIAGNOSIS — N1832 Chronic kidney disease, stage 3b: Secondary | ICD-10-CM | POA: Diagnosis not present

## 2023-09-04 DIAGNOSIS — M1990 Unspecified osteoarthritis, unspecified site: Secondary | ICD-10-CM | POA: Diagnosis not present

## 2023-09-06 ENCOUNTER — Encounter: Payer: Self-pay | Admitting: Hematology

## 2023-09-07 ENCOUNTER — Inpatient Hospital Stay: Payer: Medicare HMO

## 2023-09-07 ENCOUNTER — Inpatient Hospital Stay: Payer: Medicare HMO | Admitting: Hematology

## 2023-09-07 ENCOUNTER — Inpatient Hospital Stay: Payer: Medicare HMO | Attending: Hematology

## 2023-09-07 VITALS — BP 143/61 | HR 78 | Temp 97.8°F | Resp 18 | Ht 63.0 in | Wt 171.4 lb

## 2023-09-07 DIAGNOSIS — C182 Malignant neoplasm of ascending colon: Secondary | ICD-10-CM | POA: Diagnosis not present

## 2023-09-07 DIAGNOSIS — Z79620 Long term (current) use of immunosuppressive biologic: Secondary | ICD-10-CM | POA: Insufficient documentation

## 2023-09-07 DIAGNOSIS — C189 Malignant neoplasm of colon, unspecified: Secondary | ICD-10-CM

## 2023-09-07 DIAGNOSIS — Z79899 Other long term (current) drug therapy: Secondary | ICD-10-CM

## 2023-09-07 DIAGNOSIS — Z5112 Encounter for antineoplastic immunotherapy: Secondary | ICD-10-CM | POA: Insufficient documentation

## 2023-09-07 LAB — CBC WITH DIFFERENTIAL/PLATELET
Abs Immature Granulocytes: 0.02 10*3/uL (ref 0.00–0.07)
Basophils Absolute: 0 10*3/uL (ref 0.0–0.1)
Basophils Relative: 0 %
Eosinophils Absolute: 0.2 10*3/uL (ref 0.0–0.5)
Eosinophils Relative: 4 %
HCT: 35.2 % — ABNORMAL LOW (ref 36.0–46.0)
Hemoglobin: 11.5 g/dL — ABNORMAL LOW (ref 12.0–15.0)
Immature Granulocytes: 0 %
Lymphocytes Relative: 19 %
Lymphs Abs: 1.2 10*3/uL (ref 0.7–4.0)
MCH: 30.7 pg (ref 26.0–34.0)
MCHC: 32.7 g/dL (ref 30.0–36.0)
MCV: 94.1 fL (ref 80.0–100.0)
Monocytes Absolute: 0.5 10*3/uL (ref 0.1–1.0)
Monocytes Relative: 7 %
Neutro Abs: 4.3 10*3/uL (ref 1.7–7.7)
Neutrophils Relative %: 70 %
Platelets: 181 10*3/uL (ref 150–400)
RBC: 3.74 MIL/uL — ABNORMAL LOW (ref 3.87–5.11)
RDW: 14.1 % (ref 11.5–15.5)
WBC: 6.2 10*3/uL (ref 4.0–10.5)
nRBC: 0 % (ref 0.0–0.2)

## 2023-09-07 LAB — COMPREHENSIVE METABOLIC PANEL
ALT: 14 U/L (ref 0–44)
AST: 16 U/L (ref 15–41)
Albumin: 4 g/dL (ref 3.5–5.0)
Alkaline Phosphatase: 74 U/L (ref 38–126)
Anion gap: 12 (ref 5–15)
BUN: 13 mg/dL (ref 8–23)
CO2: 24 mmol/L (ref 22–32)
Calcium: 9.3 mg/dL (ref 8.9–10.3)
Chloride: 100 mmol/L (ref 98–111)
Creatinine, Ser: 1.19 mg/dL — ABNORMAL HIGH (ref 0.44–1.00)
GFR, Estimated: 47 mL/min — ABNORMAL LOW (ref >60)
Glucose, Bld: 110 mg/dL — ABNORMAL HIGH (ref 70–99)
Potassium: 3.5 mmol/L (ref 3.5–5.1)
Sodium: 136 mmol/L (ref 135–145)
Total Bilirubin: 0.4 mg/dL (ref 0.3–1.2)
Total Protein: 7.5 g/dL (ref 6.5–8.1)

## 2023-09-07 LAB — MAGNESIUM: Magnesium: 2 mg/dL (ref 1.7–2.4)

## 2023-09-07 LAB — TSH: TSH: 1.357 u[IU]/mL (ref 0.350–4.500)

## 2023-09-07 MED ORDER — SODIUM CHLORIDE 0.9 % IV SOLN: Freq: Once | INTRAVENOUS | Status: AC

## 2023-09-07 MED ORDER — SODIUM CHLORIDE 0.9% FLUSH
10.0000 mL | INTRAVENOUS | Status: DC | PRN
  Administered 2023-09-07: 10 mL

## 2023-09-07 MED ORDER — HEPARIN SOD (PORK) LOCK FLUSH 100 UNIT/ML IV SOLN
500.0000 [IU] | Freq: Once | INTRAVENOUS | Status: AC | PRN
  Administered 2023-09-07: 500 [IU]

## 2023-09-07 MED ORDER — SODIUM CHLORIDE 0.9 % IV SOLN
200.0000 mg | Freq: Once | INTRAVENOUS | Status: AC
  Administered 2023-09-07: 200 mg via INTRAVENOUS
  Filled 2023-09-07: qty 8

## 2023-09-07 NOTE — Patient Instructions (Signed)

## 2023-09-07 NOTE — Progress Notes (Signed)
Patient presents today for Keytruda infusion.  Patient is in satisfactory condition with no new complaints voiced.  Vital signs are stable.  Labs reviewed and all labs are within treatment parameters.  We will proceed with treatment per MD orders.

## 2023-09-07 NOTE — Progress Notes (Signed)
Patient tolerated therapy with no complaints voiced.  Side effects with management reviewed with understanding verbalized.  Port site clean and dry with no bruising or swelling noted at site.  Good blood return noted before and after administration of therapy.  Band aid applied.  Patient left in satisfactory condition with VSS and no s/s of distress noted.  

## 2023-09-22 ENCOUNTER — Ambulatory Visit (HOSPITAL_COMMUNITY)
Admission: RE | Admit: 2023-09-22 | Discharge: 2023-09-22 | Disposition: A | Discharge status: Home / Self Care | Payer: Medicare HMO | Source: Ambulatory Visit | Attending: Hematology | Admitting: Hematology

## 2023-09-22 DIAGNOSIS — C189 Malignant neoplasm of colon, unspecified: Secondary | ICD-10-CM | POA: Diagnosis not present

## 2023-09-22 DIAGNOSIS — C19 Malignant neoplasm of rectosigmoid junction: Secondary | ICD-10-CM | POA: Diagnosis not present

## 2023-09-22 DIAGNOSIS — N3289 Other specified disorders of bladder: Secondary | ICD-10-CM | POA: Diagnosis not present

## 2023-09-22 MED ORDER — IOHEXOL 9 MG/ML PO SOLN
ORAL | Status: AC
  Filled 2023-09-22: qty 1000

## 2023-09-22 MED ORDER — IOHEXOL 300 MG/ML  SOLN
80.0000 mL | Freq: Once | INTRAMUSCULAR | Status: AC | PRN
  Administered 2023-09-22: 80 mL via INTRAVENOUS

## 2023-09-27 NOTE — Progress Notes (Signed)
[facility] [address] [place] [address]    Clinic Day:  09/28/23    Referring physician: [name], DO  Patient Care Team: [name], DO as PCP - General (Family Medicine) [name], MD as Consulting Physician (Retina Ophthalmology) Fabio Pierce, MD as Consulting Physician (Ophthalmology) [place] Bali, MD (Inactive) as Consulting Physician (Gastroenterology) Doreatha Massed, MD as Medical Oncologist (Oncology)   ASSESSMENT & PLAN:   Assessment: 1.  Stage IIIb (T4AN1A) poorly differentiated right colon adenocarcinoma: -Right hemicolectomy on 04/07/2020 with poorly differentiated adenocarcinoma, pT4a, positive radial margin, 1/15 lymph nodes positive, loss of MLH1 and PMS2, bladder biopsy negative for malignancy. -CTAP on 04/06/2020 showed circumferential ascending colon mass with numerous borderline enlarged pericolonic lymph nodes.  No findings of hepatic metastatic disease. -CEA on 04/04/2020 was 12.2.  CEA improved to 1.7 on 05/13/2020. -PET scan on 05/11/2020 shows mild FDG uptake associated with the anastomotic site.  Small pulmonary nodules that do not show elevated FDG activity, remain nonspecific, subcentimeter.  Persistent but improved bladder wall thickening. -Cycle 1 of dose reduced FOLFOX on 05/19/2020, followed by 2 hospitalizations, 1 from acute kidney injury from diarrhea and second admission for C. difficile colitis. -Cycle 2 of 5-FU and leucovorin dose reduced on 06/30/2020.  Chemotherapy discontinued secondary to intolerance. -Follow-up CT scan on 11/02/2020 with multiple soft tissue lesions in the central and right mesentery measuring up to 3.1 x 1.7 cm.  Mild lymphadenopathy in the hepatic duodenal ligament, retroperitoneal space, right common iliac chain, bilateral external iliac chains.  17 mm soft tissue nodule in the lower anterior abdominal wall.  Bilateral tiny pulmonary nodules not substantially changed.  2.9 x 2.5  cm low-density lesion along the posterior uterus is indeterminate. -Pembrolizumab started on 11/10/2020. -CT CAP from 02/09/2021 showed interval near complete resolution of previously demonstrated nodal mass in the ileocolonic mesentery.  Residual ill-defined nodule medial to the tip of the right hepatic lobe.  Stable small pulmonary nodules bilaterally consistent with benign findings.   2.  Family history: -Paternal uncle had colon cancer.  Maternal aunt had brain cancer and another maternal aunt had gynecological malignancy.  Father had lung cancer and was a smoker. - Genetic testing did not reveal any notable mutations.   3.  Diffuse erythema and nodularity of the dome of the bladder: -CT scan showed severely thickened bladder wall. -Cystoscopy on 04/08/2020 showed diffuse erythema, nodularity in the posterior wall tracking to the dome.  Ureteral orifices were in normal locations.  Biopsies were benign.    Plan: 1.  Stage IV right colon adenocarcinoma, MSI-high: - She is tolerating Keytruda without any immunotherapy related side effects. - CT CAP (09/22/2023): Stable small lung nodules with no evidence of thoracic or abdominal metastatic disease. - Reviewed labs today: Normal LFTs.  Creatinine mildly elevated at 1.11 and stable.  CBC grossly normal. - We talked about continuation of Keytruda indefinitely versus stopping after 3 years which she will complete in December of this year.  She is favoring the latter option. - She will proceed with Keytruda every 3 weeks and come back in 9 weeks.  Hopefully we can stop treatment at that time.   2.  Paroxysmal atrial fibrillation: - Continue Eliquis indefinitely.  No bleeding issues.   3.  Hypertension: - Continue Hyzaar and amlodipine.  Blood pressure is 140/63.  4.  Hypokalemia: - Continue potassium 20 mill equivalents daily.  Potassium is normal.    Orders Placed This Encounter  Procedures   Magnesium    Standing Status:   Future     Standing Expiration Date:   11/29/2024   CBC with Differential    Standing Status:   Future    Standing Expiration Date:   11/30/2024   Comprehensive metabolic panel    Standing Status:   Future    Standing Expiration Date:   11/30/2024       Mikeal Hawthorne R Teague,acting as a scribe for Doreatha Massed, MD.,have documented all relevant documentation on the behalf of Doreatha Massed, MD,as directed by  Doreatha Massed, MD while in the presence of Doreatha Massed, MD.  I, Doreatha Massed MD, have reviewed the above documentation for accuracy and completeness, and I agree with the above.      Doreatha Massed, MD   10/24/20246:31 PM  CHIEF COMPLAINT:   Diagnosis: right colon cancer    Cancer Staging  Malignant neoplasm of colon [facility]) Staging form: Colon and Rectum, AJCC 8th Edition - Clinical stage from 04/27/2020: Stage IIIB (cT4a, cN1a, cM0) - Unsigned - Pathologic stage from 11/05/2020: Stage IVC (rpTX, pN0, pM1c) - Signed by Doreatha Massed, MD on 11/05/2020    Prior Therapy: Rande Lawman every 3 weeks   Current Therapy:  COLORECTAL Pembrolizumab    HISTORY OF PRESENT ILLNESS:   Oncology History  Malignant neoplasm of colon (HCC)  04/07/2020 Initial Diagnosis   Malignant neoplasm of ascending colon (HCC)   05/13/2020 Genetic Testing   [facility]     05/19/2020 - 07/02/2020 Chemotherapy   The patient had palonosetron (ALOXI) injection 0.25 mg, 0.25 mg, Intravenous,  Once, 2 of 12 cycles Administration: 0.25 mg (05/19/2020), 0.25 mg (06/30/2020) leucovorin 522 mg in dextrose 5 % 250 mL infusion, 320 mg/m2 = 522 mg (80 % of original dose 400 mg/m2), Intravenous,  Once, 2 of 12 cycles Dose modification: 320 mg/m2 (80 % of original dose 400 mg/m2, Cycle 1, Reason: Provider Judgment), 161.0960 mg/m2 (66.7 % of original dose 400 mg/m2, Cycle 2, Reason: Provider Judgment) Administration: 522 mg (05/19/2020), 434 mg (06/30/2020) oxaliplatin (ELOXATIN) 110  mg in dextrose 5 % 500 mL chemo infusion, 68 mg/m2 = 110 mg (80 % of original dose 85 mg/m2), Intravenous,  Once, 1 of 1 cycle Dose modification: 68 mg/m2 (80 % of original dose 85 mg/m2, Cycle 1, Reason: Provider Judgment) Administration: 110 mg (05/19/2020) fluorouracil (ADRUCIL) chemo injection 500 mg, 320 mg/m2 = 500 mg (80 % of original dose 400 mg/m2), Intravenous,  Once, 2 of 12 cycles Dose modification: 320 mg/m2 (80 % of original dose 400 mg/m2, Cycle 1, Reason: Provider Judgment), 454.0981 mg/m2 (66.7 % of original dose 400 mg/m2, Cycle 2, Reason: Provider Judgment) Administration: 500 mg (05/19/2020), 450 mg (06/30/2020) fluorouracil (ADRUCIL) 3,150 mg in sodium chloride 0.9 % 87 mL chemo infusion, 1,920 mg/m2 = 3,150 mg (80 % of original dose 2,400 mg/m2), Intravenous, 1 Day/Dose, 2 of 12 cycles Dose modification: 1,920 mg/m2 (80 % of original dose 2,400 mg/m2, Cycle 1, Reason: Provider Judgment), 1,600 mg/m2 (66.7 % of original dose 2,400 mg/m2, Cycle 2, Reason: Provider Judgment) Administration: 3,150 mg (05/19/2020), 2,600 mg (06/30/2020)  for chemotherapy treatment.    08/03/2020 Genetic Testing   [facility] Reveal Testing     08/14/2020 Genetic Testing   No pathogenic variants detected in [facility] Common Hereditary Cancers Panel.  Variant of uncertain significance (VUS) detected in HOXB13 at c.634G>A (p.Ala212Thr). The Common Hereditary Cancers Panel offered by [facility] includes sequencing and/or deletion duplication testing of the following 48 genes: APC,  ATM, AXIN2, BARD1, BMPR1A, BRCA1, BRCA2, BRIP1, CDH1, CDK4, CDKN2A (p14ARF), CDKN2A (p16INK4a), CHEK2, CTNNA1, DICER1, EPCAM (Deletion/duplication testing only), FLCN, GREM1 (promoter region deletion/duplication testing only), KIT, MEN1, MLH1, MSH2, MSH3, MSH6, MUTYH, NBN, NF1, NHTL1, PALB2, PDGFRA, PMS2, POLD1, POLE, PTEN, RAD50, RAD51C, RAD51D, RNF43, SDHB, SDHC, SDHD, SMAD4, SMARCA4. STK11, TP53, TSC1, TSC2, and VHL.  The following genes  were evaluated for sequence changes only: SDHA and HOXB13 c.251G>A variant only. The report date is August 14, 2020.    11/05/2020 Cancer Staging   Staging form: Colon and Rectum, AJCC 8th Edition - Pathologic stage from 11/05/2020: Stage IVC (rpTX, pN0, pM1c) - Signed by Doreatha Massed, MD on 11/05/2020   11/10/2020 - 07/13/2022 Chemotherapy   Patient is on Treatment Plan : COLORECTAL Pembrolizumab q21d     11/10/2020 -  Chemotherapy   Patient is on Treatment Plan : COLORECTAL Pembrolizumab (200) q21d        INTERVAL HISTORY:   Delanny is a 77 y.o. female presenting to clinic today for follow up of right colon cancer. She was last seen by me on 08/17/23.  Since her last visit, she underwent CT C/A/P on 09/22/23 that found: no evidence of thoracic metastasis or metastatic colon cancer in the abdomen/pelvis and stable small pulmonary nodules.   Today, she states that she is doing well overall. Her appetite level is at 100%. Her energy level is at 100%. Patient is accompanied by a family member.  She had a recent fall. She denies any side effects from [place], including skin rashes. She has diarrhea occasionally. She walks more than 2 miles everyday.  PAST MEDICAL HISTORY:   Past Medical History: Past Medical History:  Diagnosis Date   Arthritis    Cancer (HCC)    Cataract    OU   Colon cancer (HCC)    Family history of pancreatic cancer 08/21/2020   Family history of uterine cancer 08/21/2020   H/O cesarean section    Hx of tonsillectomy    Hyperlipidemia    Hypertension    Kidney disease    Macular degeneration    Exu ARMD OU   Paroxysmal atrial fibrillation (HCC) 07/05/2020   Stroke (cerebrum) (HCC) 07/05/2020   Urinary retention 04/15/2020    Surgical History: Past Surgical History:  Procedure Laterality Date   BIOPSY  04/06/2020   Procedure: BIOPSY;  Surgeon: Malissa Hippo, MD;  [facility];  Service: Endoscopy;;   CARPAL TUNNEL RELEASE Right     CATARACT EXTRACTION W/PHACO Left 10/11/2019   Procedure: CATARACT EXTRACTION PHACO AND INTRAOCULAR LENS PLACEMENT (IOC);  Surgeon: Fabio Pierce, MD;  Location: [facility];  Service: Ophthalmology;  Laterality: Left;  CDE: 8.56   CATARACT EXTRACTION W/PHACO Right 10/25/2019   Procedure: CATARACT EXTRACTION PHACO AND INTRAOCULAR LENS PLACEMENT (IOC);  Surgeon: Fabio Pierce, MD;  Location: [facility];  Service: Ophthalmology;  Laterality: Right;  CDE: 5.67   CESAREAN SECTION     COLONOSCOPY N/A 04/06/2020   Procedure: COLONOSCOPY;  Surgeon: Malissa Hippo, MD;  [facility]: [facility];  Service: Endoscopy;  Laterality: N/A;   CYSTOSCOPY WITH BIOPSY N/A 04/08/2020   Procedure: CYSTOSCOPY WITH BIOPSY;  Surgeon: Malen Gauze, MD;  Location: [facility];  Service: Urology;  Laterality: N/A;   ESOPHAGOGASTRODUODENOSCOPY N/A 04/05/2020   Procedure: ESOPHAGOGASTRODUODENOSCOPY (EGD);  Surgeon: Malissa Hippo, MD;  [facility];  Service: Endoscopy;  Laterality: N/A;   PARTIAL COLECTOMY N/A 04/08/2020   Procedure: PARTIAL COLECTOMY;  Surgeon: Algis Greenhouse  C, MD;  Location: [facility];  Service: General;  Laterality: N/A;   PORTACATH PLACEMENT Left 05/18/2020   Procedure: INSERTION PORT-A-CATH (ATTACHED CATHETER IN LEFT SUBCLAVIAN);  Surgeon: Lucretia Roers, MD;  Location: [facility];  Service: General;  Laterality: Left;   TONSILLECTOMY      Social History: Social History   Socioeconomic History   Marital status: Divorced    Spouse name: Not on file   Number of children: Not on file   Years of education: Not on file   Highest education level: Not on file  Occupational History   Not on file  Tobacco Use   Smoking status: Never   Smokeless tobacco: Never  Vaping Use   Vaping status: Never Used  Substance and Sexual Activity   Alcohol use: No   Drug use: No   Sexual activity: Not Currently  Other Topics Concern   Not on file  Social History Narrative   Not on file   Social Determinants  of Health   Financial Resource Strain: Low Risk  (03/15/2023)   Overall Financial Resource Strain (CARDIA)    Difficulty of Paying Living Expenses: Not hard at all  Food Insecurity: No Food Insecurity (08/01/2023)   Hunger Vital Sign    Worried About Running Out of Food in the Last Year: Never true    Ran Out of Food in the Last Year: Never true  Transportation Needs: No Transportation Needs (08/01/2023)   PRAPARE - Administrator, Civil Service (Medical): No    Lack of Transportation (Non-Medical): No  Physical Activity: Sufficiently Active (03/15/2023)   Exercise Vital Sign    Days of Exercise per Week: 5 days    Minutes of Exercise per Session: 30 min  Stress: No Stress Concern Present (03/15/2023)   [employer] of Occupational Health - Occupational Stress Questionnaire    Feeling of Stress : Not at all  Social Connections: Moderately Integrated (03/15/2023)   Social Connection and Isolation Panel [NHANES]    Frequency of Communication with Friends and Family: More than three times a week    Frequency of Social Gatherings with Friends and Family: More than three times a week    Attends Religious Services: More than 4 times per year    Active Member of [employer] or Organizations: Yes    Attends Banker Meetings: More than 4 times per year    Marital Status: Widowed  Intimate Partner Violence: Not At Risk (07/28/2023)   Humiliation, Afraid, Rape, and Kick questionnaire    Fear of Current or Ex-Partner: No    Emotionally Abused: No    Physically Abused: No    Sexually Abused: No    Family History: Family History  Problem Relation Age of Onset   Macular degeneration Mother    Stroke Mother    Hypertension Mother    Dementia Mother    Lung cancer Father        dx late 90s; smoking hx   Diabetes Sister    Hypertension Sister    Leukemia Paternal Uncle        d. 24s   Cancer Maternal Aunt        ovarian or endometrial dx 32s   Pancreatic cancer  Paternal Uncle        d. late 27s    Current Medications:  Current Outpatient Medications:    alendronate (FOSAMAX) 70 MG tablet, TAKE ONE TABLET EVERY 7 DAYS ON AN EMPTY STOMACH WITH A FULL GLASS  OF WATER, Disp: 12 tablet, Rfl: 3   amLODipine (NORVASC) 10 MG tablet, Take 1 tablet (10 mg total) by mouth daily. For blood pressure, Disp: 90 tablet, Rfl: 3   atorvastatin (LIPITOR) 20 MG tablet, Take 1 tablet (20 mg total) by mouth daily. For cholesterol, Disp: 90 tablet, Rfl: 3   cholecalciferol (VITAMIN D3) 25 MCG (1000 UT) tablet, Take 1,000 Units by mouth daily., Disp: , Rfl:    CRANBERRY FRUIT PO, Take 1 tablet by mouth daily., Disp: , Rfl:    diclofenac Sodium (VOLTAREN) 1 % GEL, APPLY 4 GRAMS TO AFFECTED AREA(S) 4 TIMES A DAY (Patient taking differently: Apply 4 g topically 4 (four) times daily as needed (Pain).), Disp: 400 g, Rfl: 2   dorzolamide-timolol (COSOPT) 2-0.5 % ophthalmic solution, INSTILL ONE DROP IN EACH EYE TWICE DAILY, Disp: 10 mL, Rfl: 10   ELIQUIS 5 MG TABS tablet, Take 1 tablet (5 mg total) by mouth 2 (two) times daily., Disp: 180 tablet, Rfl: 3   Ferrous Sulfate (IRON) 325 (65 Fe) MG TABS, Take 1 tablet (325 mg total) by mouth daily., Disp: 90 tablet, Rfl: 3   fluticasone (FLONASE) 50 MCG/ACT nasal spray, Place 2 sprays into both nostrils daily., Disp: 16 g, Rfl: 6   guaiFENesin-codeine 100-10 MG/5ML syrup, Take 5 mLs by mouth 3 (three) times daily as needed for cough., Disp: 120 mL, Rfl: 0   hydrALAZINE (APRESOLINE) 25 MG tablet, Take 1 tablet (25 mg total) by mouth 2 (two) times daily., Disp: 180 tablet, Rfl: 3   Multiple Vitamins-Minerals (CENTRUM ADULT PO), Take 1 tablet by mouth daily., Disp: , Rfl:    Potassium Chloride ER 20 MEQ TBCR, Take 1 tablet (20 mEq total) by mouth daily. Take while taking hydrochlorothiazide/HCTZ, Disp: 30 tablet, Rfl: 1   Allergies: Allergies  Allergen Reactions   Lisinopril Cough    REVIEW OF SYSTEMS:   Review of Systems   Constitutional:  Negative for chills, fatigue and fever.  HENT:   Negative for lump/mass, mouth sores, nosebleeds, sore throat and trouble swallowing.   Eyes:  Negative for eye problems.  Respiratory:  Negative for cough and shortness of breath.   Cardiovascular:  Negative for chest pain, leg swelling and palpitations.  Gastrointestinal:  Positive for diarrhea. Negative for abdominal pain, constipation, nausea and vomiting.  Genitourinary:  Negative for bladder incontinence, difficulty urinating, dysuria, frequency, hematuria and nocturia.   Musculoskeletal:  Negative for arthralgias, back pain, flank pain, myalgias and neck pain.  Skin:  Negative for itching and rash.  Neurological:  Positive for headaches. Negative for dizziness and numbness.  Hematological:  Does not bruise/bleed easily.  Psychiatric/Behavioral:  Negative for depression, sleep disturbance and suicidal ideas. The patient is not nervous/anxious.   All other systems reviewed and are negative.    VITALS:   Blood pressure (!) 147/63, pulse 76, temperature 97.6 F (36.4 C), temperature source Oral, resp. rate 20, weight 170 lb 6.4 oz (77.3 kg), SpO2 100%.  Wt Readings from Last 3 Encounters:  09/28/23 170 lb 6.4 oz (77.3 kg)  09/07/23 171 lb 6.4 oz (77.7 kg)  08/17/23 171 lb 9.6 oz (77.8 kg)    Body mass index is 30.19 kg/m.  Performance status (ECOG): 1 - Symptomatic but completely ambulatory  PHYSICAL EXAM:   Physical Exam Vitals and nursing note reviewed. Exam conducted with a chaperone present.  Constitutional:      Appearance: Normal appearance.  Cardiovascular:     Rate and Rhythm: Normal rate and regular  rhythm.     Pulses: Normal pulses.     Heart sounds: Normal heart sounds.  Pulmonary:     Effort: Pulmonary effort is normal.     Breath sounds: Normal breath sounds.  Abdominal:     Palpations: Abdomen is soft. There is no hepatomegaly, splenomegaly or mass.     Tenderness: There is no abdominal  tenderness.  Musculoskeletal:     Right lower leg: No edema.     Left lower leg: No edema.  Lymphadenopathy:     Cervical: No cervical adenopathy.     Right cervical: No superficial, deep or posterior cervical adenopathy.    Left cervical: No superficial, deep or posterior cervical adenopathy.     Upper Body:     Right upper body: No supraclavicular or axillary adenopathy.     Left upper body: No supraclavicular or axillary adenopathy.  Neurological:     General: No focal deficit present.     Mental Status: She is alert and oriented to person, place, and time.  Psychiatric:        Mood and Affect: Mood normal.        Behavior: Behavior normal.     LABS:      Latest Ref Rng & Units 09/28/2023    8:47 AM 09/07/2023   11:34 AM 08/17/2023    9:05 AM  CBC  WBC 4.0 - 10.5 K/uL 6.1  6.2  4.8   Hemoglobin 12.0 - 15.0 g/dL 16.1  09.6  04.5   Hematocrit 36.0 - 46.0 % 36.0  35.2  37.5   Platelets 150 - 400 K/uL 150  181  218       Latest Ref Rng & Units 09/28/2023    8:47 AM 09/07/2023   11:34 AM 08/17/2023    9:05 AM  CMP  Glucose 70 - 99 mg/dL 409  811  914   BUN 8 - 23 mg/dL 19  13  15    Creatinine 0.44 - 1.00 mg/dL 7.82  9.56  2.13   Sodium 135 - 145 mmol/L 136  136  135   Potassium 3.5 - 5.1 mmol/L 3.6  3.5  3.6   Chloride 98 - 111 mmol/L 99  100  98   CO2 22 - 32 mmol/L 27  24  25    Calcium 8.9 - 10.3 mg/dL 9.3  9.3  9.8   Total Protein 6.5 - 8.1 g/dL 7.5  7.5  8.2   Total Bilirubin 0.3 - 1.2 mg/dL 0.4  0.4  0.5   Alkaline Phos 38 - 126 U/L 75  74  81   AST 15 - 41 U/L 17  16  18    ALT 0 - 44 U/L 13  14  13       Lab Results  Component Value Date   CEA1 2.1 08/17/2023   /  CEA  Date Value Ref Range Status  08/17/2023 2.1 0.0 - 4.7 ng/mL Final    Comment:    (NOTE)                             Nonsmokers          <3.9                             Smokers             <5.6 Roche [employer] Electrochemiluminescence Immunoassay (ECLIA) Values  obtained with  different assay methods or kits cannot be used interchangeably.  Results cannot be interpreted as absolute evidence of the presence or absence of malignant disease. Performed At: [facility] [address] [place], [place] [id number] Jolene Schimke MD ZH:[id number]    No results found for: "PSA1" No results found for: "CAN199" No results found for: "CAN125"  No results found for: "TOTALPROTELP", "ALBUMINELP", "A1GS", "A2GS", "BETS", "BETA2SER", "GAMS", "MSPIKE", "SPEI" Lab Results  Component Value Date   TIBC 371 03/01/2023   TIBC 396 05/13/2020   TIBC 395 05/13/2020   FERRITIN 52 03/01/2023   FERRITIN 65 05/13/2020   FERRITIN 64 05/13/2020   IRONPCTSAT 18 03/01/2023   IRONPCTSAT 23 05/13/2020   IRONPCTSAT 23 05/13/2020   No results found for: "LDH"   STUDIES:   CT CHEST ABDOMEN PELVIS W CONTRAST  Result Date: 09/27/2023 CLINICAL DATA:  Poorly differentiated colorectal carcinoma. RIGHT hemicolectomy. Ongoing chemotherapy. Stage IIIB. * Tracking Code: BO * EXAM: CT CHEST, ABDOMEN, AND PELVIS WITH CONTRAST TECHNIQUE: Multidetector CT imaging of the chest, abdomen and pelvis was performed following the standard protocol during bolus administration of intravenous contrast. RADIATION DOSE REDUCTION: This exam was performed according to the departmental dose-optimization program which includes automated exposure control, adjustment of the mA and/or kV according to patient size and/or use of iterative reconstruction technique. CONTRAST:  80mL OMNIPAQUE IOHEXOL 300 MG/ML  SOLN COMPARISON:  None Available. FINDINGS: CT CHEST FINDINGS Cardiovascular: Port in the anterior chest wall with tip in distal SVC. No significant vascular findings. Normal heart size. No pericardial effusion. Mediastinum/Nodes: No axillary or supraclavicular adenopathy. No mediastinal or hilar adenopathy. No pericardial fluid. Esophagus normal. Lungs/Pleura: 5 mm nodule along the inferior aspect of the RIGHT  lower lobe on image 103. No change. 5 mm nodule in the RIGHT middle lobe on image 91. No change. LEFT lower lobe 6 mm nodule (image 83) is unchanged. No new pulmonary nodules. Musculoskeletal: No aggressive osseous lesion. CT ABDOMEN AND PELVIS FINDINGS Hepatobiliary: No focal hepatic lesion. No biliary ductal dilatation. Gallbladder is normal. Common bile duct is normal. Pancreas: Pancreas is normal. No ductal dilatation. No pancreatic inflammation. Spleen: Normal spleen. Small benign-appearing cyst in the spleen unchanged Adrenals/urinary tract: Adrenal glands and kidneys are normal. Ureters normal. Bladder is moderately distended and mildly thick-walled. No change from prior. Stomach/Bowel: Stomach, duodenum and small-bowel normal. Post RIGHT hemicolectomy. Transverse colon and LEFT colon are normal. Moderate volume stool throughout the colon. Vascular/Lymphatic: Abdominal aorta is normal caliber with atherosclerotic calcification. There is no retroperitoneal or periportal lymphadenopathy. No pelvic lymphadenopathy. Reproductive: Uterus and adnexa unremarkable. Other: No free fluid. Musculoskeletal: No aggressive osseous lesion. IMPRESSION: CHEST: 1. Stable small pulmonary nodules. 2. No evidence of thoracic metastasis. PELVIS: 1. No evidence of metastatic colon cancer in the abdomen pelvis. 2. Post RIGHT hemicolectomy. 3. Chronic distension of the bladder. Electronically Signed   By: Genevive Bi M.D.   On: 09/27/2023 16:05   Intravitreal Injection, Pharmacologic Agent - OD - Right Eye  Result Date: 08/30/2023 Time Out 08/30/2023. 8:33 AM. Confirmed correct patient, procedure, site, and patient consented. Anesthesia Topical anesthesia was used. Anesthetic medications included Lidocaine 2%, Proparacaine 0.5%. Procedure Preparation included 5% betadine to ocular surface, eyelid speculum. A (33g) needle was used. Injection: 2 mg aflibercept 2 MG/0.05ML   Route: Intravitreal, Site: Right Eye   NDC:  [id number], Lot: [id number], Expiration date: 11/03/2024, Waste: 0 mL Post-op Post injection exam found visual acuity of at least counting fingers. The patient tolerated the  procedure well. There were no complications. The patient received written and verbal post procedure care education.   Intravitreal Injection, Pharmacologic Agent - OS - Left Eye  Result Date: 08/30/2023 Time Out 08/30/2023. 8:33 AM. Confirmed correct patient, procedure, site, and patient consented. Anesthesia Topical anesthesia was used. Anesthetic medications included Lidocaine 2%, Proparacaine 0.5%. Procedure Preparation included 5% betadine to ocular surface, eyelid speculum. A (32g) needle was used. Injection: 6 mg faricimab-svoa 6 MG/0.05ML   Route: Intravitreal, Site: Left Eye   NDC: [id number], Lot: [id number], Expiration date: 08/04/2025, Waste: 0 mL Post-op Post injection exam found visual acuity of at least counting fingers. The patient tolerated the procedure well. There were no complications. The patient received written and verbal post procedure care education. Post injection medications were not given.   OCT, Retina - OU - Both Eyes  Result Date: 08/30/2023 Right Eye Quality was good. Central Foveal Thickness: 429. Progression has worsened. Findings include no SRF, abnormal foveal contour, retinal drusen , outer retinal tubulation, subretinal hyper-reflective material, disciform scar, epiretinal membrane, intraretinal fluid, pigment epithelial detachment, outer retinal atrophy (Mild interval increase in cystic changes overlying stable central SRHM / disciform scar). Left Eye Quality was good. Central Foveal Thickness: 274. Progression has been stable. Findings include no SRF, abnormal foveal contour, retinal drusen , subretinal hyper-reflective material, intraretinal fluid, pigment epithelial detachment, outer retinal atrophy (Trace cystic changes nasal fovea and macula overlying PED/SRHM ). Notes *Images captured and  stored on drive Diagnosis / Impression: Exudative ARMD OU OD: Mild interval increase in cystic changes overlying stable central SRHM / disciform scar OS: trace cystic changes nasal fovea and macula overlying PED/SRHM Clinical management: See below Abbreviations: NFP - Normal foveal profile. CME - cystoid macular edema. PED - pigment epithelial detachment. IRF - intraretinal fluid. SRF - subretinal fluid. EZ - ellipsoid zone. ERM - epiretinal membrane. ORA - outer retinal atrophy. ORT - outer retinal tubulation. SRHM - subretinal hyper-reflective material

## 2023-09-28 ENCOUNTER — Encounter: Payer: Self-pay | Admitting: Hematology

## 2023-09-28 ENCOUNTER — Inpatient Hospital Stay: Payer: Medicare HMO

## 2023-09-28 ENCOUNTER — Inpatient Hospital Stay: Payer: Medicare HMO | Admitting: Hematology

## 2023-09-28 VITALS — BP 141/86 | HR 74 | Temp 97.7°F | Resp 18

## 2023-09-28 VITALS — BP 147/63 | HR 76 | Temp 97.6°F | Resp 20 | Wt 170.4 lb

## 2023-09-28 DIAGNOSIS — C189 Malignant neoplasm of colon, unspecified: Secondary | ICD-10-CM

## 2023-09-28 DIAGNOSIS — C182 Malignant neoplasm of ascending colon: Secondary | ICD-10-CM | POA: Diagnosis not present

## 2023-09-28 DIAGNOSIS — Z79620 Long term (current) use of immunosuppressive biologic: Secondary | ICD-10-CM | POA: Diagnosis not present

## 2023-09-28 DIAGNOSIS — Z5112 Encounter for antineoplastic immunotherapy: Secondary | ICD-10-CM | POA: Diagnosis not present

## 2023-09-28 LAB — CBC WITH DIFFERENTIAL/PLATELET
Abs Immature Granulocytes: 0.04 10*3/uL (ref 0.00–0.07)
Basophils Absolute: 0 10*3/uL (ref 0.0–0.1)
Basophils Relative: 1 %
Eosinophils Absolute: 0.3 10*3/uL (ref 0.0–0.5)
Eosinophils Relative: 5 %
HCT: 36 % (ref 36.0–46.0)
Hemoglobin: 11.6 g/dL — ABNORMAL LOW (ref 12.0–15.0)
Immature Granulocytes: 1 %
Lymphocytes Relative: 20 %
Lymphs Abs: 1.2 10*3/uL (ref 0.7–4.0)
MCH: 30.4 pg (ref 26.0–34.0)
MCHC: 32.2 g/dL (ref 30.0–36.0)
MCV: 94.5 fL (ref 80.0–100.0)
Monocytes Absolute: 0.5 10*3/uL (ref 0.1–1.0)
Monocytes Relative: 9 %
Neutro Abs: 4 10*3/uL (ref 1.7–7.7)
Neutrophils Relative %: 64 %
Platelets: 150 10*3/uL (ref 150–400)
RBC: 3.81 MIL/uL — ABNORMAL LOW (ref 3.87–5.11)
RDW: 13.8 % (ref 11.5–15.5)
WBC: 6.1 10*3/uL (ref 4.0–10.5)
nRBC: 0 % (ref 0.0–0.2)

## 2023-09-28 LAB — COMPREHENSIVE METABOLIC PANEL
ALT: 13 U/L (ref 0–44)
AST: 17 U/L (ref 15–41)
Albumin: 3.9 g/dL (ref 3.5–5.0)
Alkaline Phosphatase: 75 U/L (ref 38–126)
Anion gap: 10 (ref 5–15)
BUN: 19 mg/dL (ref 8–23)
CO2: 27 mmol/L (ref 22–32)
Calcium: 9.3 mg/dL (ref 8.9–10.3)
Chloride: 99 mmol/L (ref 98–111)
Creatinine, Ser: 1.11 mg/dL — ABNORMAL HIGH (ref 0.44–1.00)
GFR, Estimated: 52 mL/min — ABNORMAL LOW (ref >60)
Glucose, Bld: 106 mg/dL — ABNORMAL HIGH (ref 70–99)
Potassium: 3.6 mmol/L (ref 3.5–5.1)
Sodium: 136 mmol/L (ref 135–145)
Total Bilirubin: 0.4 mg/dL (ref 0.3–1.2)
Total Protein: 7.5 g/dL (ref 6.5–8.1)

## 2023-09-28 LAB — MAGNESIUM: Magnesium: 2.1 mg/dL (ref 1.7–2.4)

## 2023-09-28 MED ORDER — SODIUM CHLORIDE 0.9% FLUSH
10.0000 mL | INTRAVENOUS | Status: DC | PRN
Start: 2023-09-28 — End: 2023-09-28
  Administered 2023-09-28: 10 mL

## 2023-09-28 MED ORDER — HEPARIN SOD (PORK) LOCK FLUSH 100 UNIT/ML IV SOLN
500.0000 [IU] | Freq: Once | INTRAVENOUS | Status: AC | PRN
  Administered 2023-09-28: 500 [IU]

## 2023-09-28 MED ORDER — SODIUM CHLORIDE 0.9 % IV SOLN: Freq: Once | INTRAVENOUS | Status: AC

## 2023-09-28 MED ORDER — PEMBROLIZUMAB CHEMO INJECTION 100 MG/4ML
200.0000 mg | Freq: Once | INTRAVENOUS | Status: AC
  Administered 2023-09-28: 200 mg via INTRAVENOUS
  Filled 2023-09-28: qty 8

## 2023-09-28 NOTE — Patient Instructions (Signed)
[facility] at [facility] Discharge Instructions   You were seen and examined today by Dr. [name].  He reviewed the results of your lab work which are normal/stable.   He reviewed the results of your CT scan which is normal. There is no evidence of cancer.   We will proceed with your treatment today.   Return as scheduled.    Thank you for choosing [facility] at [facility] to provide your oncology and hematology care.  To afford each patient quality time with our provider, please arrive at least 15 minutes before your scheduled appointment time.   If you have a lab appointment with the Cancer Center please come in thru the Main Entrance and check in at the main information desk.  You need to re-schedule your appointment should you arrive 10 or more minutes late.  We strive to give you quality time with our providers, and arriving late affects you and other patients whose appointments are after yours.  Also, if you no show three or more times for appointments you may be dismissed from the clinic at the providers discretion.     Again, thank you for choosing [facility].  Our hope is that these requests will decrease the amount of time that you wait before being seen by our physicians.       _____________________________________________________________  Should you have questions after your visit to [facility], please contact our office at [phone] and follow the prompts.  Our office hours are 8:00 a.m. and 4:30 p.m. Monday - Friday.  Please note that voicemails left after 4:00 p.m. may not be returned until the following business day.  We are closed weekends and major holidays.  You do have access to a nurse 24-7, just call the main number to the clinic [phone] and do not press any options, hold on the line and a nurse will answer the phone.    For prescription refill requests, have your pharmacy contact  our office and allow 72 hours.    Due to Covid, you will need to wear a mask upon entering the hospital. If you do not have a mask, a mask will be given to you at the Main Entrance upon arrival. For doctor visits, patients may have 1 support person age 59 or older with them. For treatment visits, patients can not have anyone with them due to social distancing guidelines and our immunocompromised population.

## 2023-09-28 NOTE — Progress Notes (Signed)
Patient has been examined by Dr. Katragadda. Vital signs and labs have been reviewed by MD - ANC, Creatinine, LFTs, hemoglobin, and platelets are within treatment parameters per M.D. - pt may proceed with treatment.  Primary RN and pharmacy notified.  

## 2023-09-28 NOTE — Patient Instructions (Signed)
[facility] [place] [facility]  Discharge Instructions: Thank you for choosing [facility] to provide your oncology and hematology care.  If you have a lab appointment with the Cancer Center - please note that after April 8th, 2024, all labs will be drawn in the cancer center.  You do not have to check in or register with the main entrance as you have in the past but will complete your check-in in the cancer center.  Wear comfortable clothing and clothing appropriate for easy access to any Portacath or PICC line.   We strive to give you quality time with your provider. You may need to reschedule your appointment if you arrive late (15 or more minutes).  Arriving late affects you and other patients whose appointments are after yours.  Also, if you miss three or more appointments without notifying the office, you may be dismissed from the clinic at the provider's discretion.      For prescription refill requests, have your pharmacy contact our office and allow 72 hours for refills to be completed.    Today you received the following chemotherapy and/or immunotherapy agents Keytruda      To help prevent nausea and vomiting after your treatment, we encourage you to take your nausea medication as directed.  BELOW ARE SYMPTOMS THAT SHOULD BE REPORTED IMMEDIATELY: *FEVER GREATER THAN 100.4 F (38 C) OR HIGHER *CHILLS OR SWEATING *NAUSEA AND VOMITING THAT IS NOT CONTROLLED WITH YOUR NAUSEA MEDICATION *UNUSUAL SHORTNESS OF BREATH *UNUSUAL BRUISING OR BLEEDING *URINARY PROBLEMS (pain or burning when urinating, or frequent urination) *BOWEL PROBLEMS (unusual diarrhea, constipation, pain near the anus) TENDERNESS IN MOUTH AND THROAT WITH OR WITHOUT PRESENCE OF ULCERS (sore throat, sores in mouth, or a toothache) UNUSUAL RASH, SWELLING OR PAIN  UNUSUAL VAGINAL DISCHARGE OR ITCHING   Items with * indicate a potential emergency and should be followed up as soon as possible or go to the  Emergency Department if any problems should occur.  Please show the CHEMOTHERAPY ALERT CARD or IMMUNOTHERAPY ALERT CARD at check-in to the Emergency Department and triage nurse.  Should you have questions after your visit or need to cancel or reschedule your appointment, please contact [facility] [phone]  and follow the prompts.  Office hours are 8:00 a.m. to 4:30 p.m. Monday - Friday. Please note that voicemails left after 4:00 p.m. may not be returned until the following business day.  We are closed weekends and major holidays. You have access to a nurse at all times for urgent questions. Please call the main number to the clinic [phone] and follow the prompts.  For any non-urgent questions, you may also contact your provider using MyChart. We now offer e-Visits for anyone 18 and older to request care online for non-urgent symptoms. For details visit [email or web address].   Also download the MyChart app! Go to the app store, search "MyChart", open the app, select [place], and log in with your MyChart username and password.   

## 2023-09-28 NOTE — Progress Notes (Signed)
Labs reviewed with MD today. Labs reviewed and ok to treat per MD.   Treatment given per orders. Patient tolerated it well without problems. Vitals stable and discharged home from clinic ambulatory. Follow up as scheduled.

## 2023-09-29 LAB — CEA: CEA: 1.4 ng/mL (ref 0.0–4.7)

## 2023-09-29 NOTE — Progress Notes (Signed)
[facility] - Clinic Note  10/11/2023    CHIEF COMPLAINT Patient presents for Retina Follow Up  HISTORY OF PRESENT ILLNESS: Gabrielle [name] is a 77 y.o. female who presents to the clinic today for:   HPI     Retina Follow Up   Patient presents with  Wet AMD.  In both eyes.  This started 6 weeks ago.  I, the attending physician,  performed the HPI with the patient and updated documentation appropriately.        Comments   Patient here for 6 weeks retina follow up for exu ARMD OU. Patient states vision about the same. No eye pain. Still using drops.       Last edited by [name], MD on 10/11/2023  9:21 AM.    Patient states   Referring physician: [name], DO [address] [place],  [place] [address]  HISTORICAL INFORMATION:   Selected notes from the MEDICAL RECORD NUMBER Referred by Dr. Mercie Eon for concern of ARMD OU   CURRENT MEDICATIONS: Current Outpatient Medications (Ophthalmic Drugs)  Medication Sig   dorzolamide-timolol (COSOPT) 2-0.5 % ophthalmic solution INSTILL ONE DROP IN [facility] EYE TWICE DAILY   No current facility-administered medications for this visit. (Ophthalmic Drugs)   Current Outpatient Medications (Other)  Medication Sig   alendronate (FOSAMAX) 70 MG tablet TAKE ONE TABLET EVERY 7 DAYS ON AN EMPTY STOMACH WITH A FULL GLASS OF WATER   amLODipine (NORVASC) 10 MG tablet Take 1 tablet (10 mg total) by mouth daily. For blood pressure   atorvastatin (LIPITOR) 20 MG tablet Take 1 tablet (20 mg total) by mouth daily. For cholesterol   cholecalciferol (VITAMIN D3) 25 MCG (1000 UT) tablet Take 1,000 Units by mouth daily.   CRANBERRY FRUIT PO Take 1 tablet by mouth daily.   diclofenac Sodium (VOLTAREN) 1 % GEL APPLY 4 GRAMS TO AFFECTED AREA(S) 4 TIMES A DAY (Patient taking differently: Apply 4 g topically 4 (four) times daily as needed (Pain).)   ELIQUIS 5 MG TABS tablet Take 1 tablet (5 mg total) by mouth 2 (two) times daily.    Ferrous Sulfate (IRON) 325 (65 Fe) MG TABS Take 1 tablet (325 mg total) by mouth daily.   fluticasone (FLONASE) 50 MCG/ACT nasal spray Place 2 sprays into both nostrils daily.   guaiFENesin-codeine 100-10 MG/5ML syrup Take 5 mLs by mouth 3 (three) times daily as needed for cough.   hydrALAZINE (APRESOLINE) 25 MG tablet Take 1 tablet (25 mg total) by mouth 2 (two) times daily.   Multiple Vitamins-Minerals (CENTRUM ADULT PO) Take 1 tablet by mouth daily.   Potassium Chloride ER 20 MEQ TBCR Take 1 tablet (20 mEq total) by mouth daily. Take while taking hydrochlorothiazide/HCTZ   No current facility-administered medications for this visit. (Other)   REVIEW OF SYSTEMS: ROS   Positive for: Gastrointestinal, Genitourinary, Musculoskeletal, Cardiovascular, Eyes Negative for: Constitutional, Neurological, Skin, HENT, Endocrine, Respiratory, Psychiatric, Allergic/Imm, Heme/Lymph Last edited by Laddie Aquas, COA on 10/11/2023  8:05 AM.     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   Urinary retention 04/15/2020   Past Surgical History:  Procedure Laterality Date   BIOPSY  04/06/2020   Procedure: BIOPSY;  Surgeon: Malissa Hippo, MD;  Location: [facility];  Service: Endoscopy;;   CARPAL TUNNEL RELEASE Right    CATARACT EXTRACTION W/PHACO Left 10/11/2019   Procedure: CATARACT EXTRACTION PHACO AND INTRAOCULAR LENS PLACEMENT (IOC);  Surgeon: Fabio Pierce, MD;  Location: [facility] ORS;  Service: Ophthalmology;  Laterality:  Left;  CDE: 8.56   CATARACT EXTRACTION W/PHACO Right 10/25/2019   Procedure: CATARACT EXTRACTION PHACO AND INTRAOCULAR LENS PLACEMENT (IOC);  Surgeon: Fabio Pierce, MD;  Location: [facility] ORS;  Service: Ophthalmology;  Laterality: Right;  CDE: 5.67   CESAREAN SECTION     COLONOSCOPY N/A 04/06/2020   Procedure: COLONOSCOPY;  Surgeon: Malissa Hippo, MD;  Location: [facility];  Service: Endoscopy;  Laterality: N/A;   CYSTOSCOPY WITH BIOPSY N/A 04/08/2020   Procedure: CYSTOSCOPY WITH BIOPSY;  Surgeon: Malen Gauze, MD;  Location: [facility] ORS;  Service: Urology;  Laterality: N/A;   ESOPHAGOGASTRODUODENOSCOPY N/A 04/05/2020   Procedure: ESOPHAGOGASTRODUODENOSCOPY (EGD);  Surgeon: Malissa Hippo, MD;  Location: [facility];  Service: Endoscopy;  Laterality: N/A;   PARTIAL COLECTOMY N/A 04/08/2020   Procedure: PARTIAL COLECTOMY;  Surgeon: Lucretia Roers, MD;  Location: [facility] ORS;  Service: General;  Laterality: N/A;   PORTACATH PLACEMENT Left 05/18/2020   Procedure: INSERTION PORT-A-CATH (ATTACHED CATHETER IN LEFT SUBCLAVIAN);  Surgeon: Lucretia Roers, MD;  Location: [facility] ORS;  Service: General;  Laterality: Left;   TONSILLECTOMY     FAMILY HISTORY Family History  Problem Relation Age of Onset   Macular degeneration Mother    Stroke Mother    Hypertension Mother    Dementia Mother    Lung cancer Father        dx late 20s; smoking hx   Diabetes Sister    Hypertension Sister    Leukemia Paternal Uncle        d. 43s   Cancer Maternal Aunt        ovarian or endometrial dx 26s   Pancreatic cancer Paternal Uncle        d. late 49s   SOCIAL HISTORY Social History   Tobacco Use   Smoking status: Never   Smokeless tobacco: Never  Vaping Use   Vaping status: Never Used  Substance Use Topics   Alcohol use: No   Drug use: No       OPHTHALMIC EXAM: Base Eye Exam     Visual Acuity (Snellen - Linear)       Right Left   Dist cc CF at 3' 20/60 -2   Dist ph cc NI NI    Correction:  Glasses         Tonometry (Tonopen, 7:54 AM)       Right Left   Pressure 16 14         Pupils       Dark Light Shape React APD   Right 3 2 Round Brisk None   Left 3 2 Round Brisk None         Visual Fields (Counting fingers)       Left Right    Full Full         Extraocular Movement  Right Left    Full, Ortho Full, Ortho         Neuro/Psych     Oriented x3: Yes   Mood/Affect: Normal         Dilation     Both eyes: 1.0% Mydriacyl, 2.5% Phenylephrine @ 7:53 AM           Slit Lamp and Fundus Exam     Slit Lamp Exam       Right Left   Lids/Lashes Dermatochalasis - upper lid Dermatochalasis - upper lid   Conjunctiva/Sclera 1+ Injection Trace Injection   Cornea Arcus, 3+ fine Punctpate epithelial erosions Arcus, 3+ fine Punctate epithelial erosions, mild tear film debris   Anterior Chamber Deep and quiet Deep and quiet   Iris Round and well dilated Round and well dilated   Lens PCIOL in good position, 1+ Posterior capsular opacification PC IOL in good position with open PC   Anterior Vitreous Vitreous syneresis, Posterior vitreous detachment, vitreous condensations Vitreous syneresis, Posterior vitreous detachment, silicone oil micro drops         Fundus Exam       Right Left   Disc pink and sharp, compact mild pallor, sharp rim, Compact, vascular loops superiorly   C/D Ratio 0.2 0.2   Macula Blunted foveal reflex, large central GA, Drusen, Pigment clumping and atrophy, central thickening/pigmented disciform scar, +PED, punctate IRH superior macula within atrophy -- stably improved, persistent trace cystic changes overlying central scar Blunted foveal reflex, central thickening, RPE clumping and atrophy, Drusen, trace cystic changes overlying PED -- slightly improved, no heme   Vessels Vascular attenuation, Tortuous Vascular attenuation, Tortuous   Periphery Attached, mild Reticular degeneration Attached, mild Reticular degeneration            Refraction     Wearing Rx       Sphere Cylinder Add   Right [place] Sphere +3.50   Left -0.50 Sphere +3.50    Type: PAL           IMAGING AND PROCEDURES  Imaging and Procedures for 03/21/18  OCT, Retina - OU - Both Eyes       Right Eye Quality was good. Central Foveal Thickness: 506. Progression has been stable. Findings include no SRF, abnormal foveal contour, retinal drusen , outer retinal tubulation, subretinal hyper-reflective material, disciform scar, epiretinal membrane, intraretinal fluid, pigment epithelial detachment, outer retinal atrophy (Persistent cystic changes overlying stable central SRHM / disciform scar).   Left Eye Quality was good. Central Foveal Thickness: 283. Progression has been stable. Findings include no SRF, abnormal foveal contour, retinal drusen , subretinal hyper-reflective material, intraretinal fluid, pigment epithelial detachment, outer retinal atrophy (Trace persistent cystic changes nasal fovea and macula overlying PED/SRHM ).   Notes *Images captured and stored on drive  Diagnosis / Impression:  Exudative ARMD OU OD: persistent cystic changes overlying stable central SRHM / disciform scar OS: trace persistent cystic changes nasal fovea and macula overlying PED/SRHM  Clinical management:  See below  Abbreviations: NFP - Normal foveal profile. CME - cystoid macular edema. PED - pigment epithelial detachment. IRF - intraretinal fluid. SRF - subretinal fluid. EZ - ellipsoid zone. ERM - epiretinal membrane. ORA - outer retinal atrophy. ORT - outer retinal tubulation. SRHM - subretinal hyper-reflective material      Intravitreal Injection, Pharmacologic Agent - OD - Right Eye       Time Out 10/11/2023. 8:38 AM. Confirmed correct patient, procedure, site, and patient consented.   Anesthesia Topical anesthesia was used.  Anesthetic medications included Lidocaine 2%, Proparacaine 0.5%.   Procedure Preparation included 5% betadine to  ocular surface, eyelid speculum. A (33g) needle was used.   Injection: 2 mg aflibercept 2 MG/0.05ML   Route: Intravitreal, Site: Right Eye   NDC: L6038910, Lot: [id number], Expiration date: 08/04/2024, Waste: 0 mL   Post-op Post injection exam found visual acuity of at least counting fingers. The patient tolerated the procedure well. There were no complications. The patient received written and verbal post procedure care education.      Intravitreal Injection, Pharmacologic Agent - OS - Left Eye       Time Out 10/11/2023. 8:39 AM. Confirmed correct patient, procedure, site, and patient consented.   Anesthesia Topical anesthesia was used. Anesthetic medications included Lidocaine 2%, Proparacaine 0.5%.   Procedure Preparation included 5% betadine to ocular surface, eyelid speculum. A supplied (32g) needle was used.   Injection: 6 mg faricimab-svoa 6 MG/0.05ML   Route: Intravitreal, Site: Left Eye   NDC: 09811-914-78, Lot: [id number], Expiration date: 03/04/2025, Waste: 0 mL   Post-op Post injection exam found visual acuity of at least counting fingers. The patient tolerated the procedure well. There were no complications. The patient received written and verbal post procedure care education. Post injection medications were not given.            ASSESSMENT/PLAN:    ICD-10-CM   1. Exudative age-related macular degeneration of both eyes with active choroidal neovascularization (HCC)  H35.3231 OCT, Retina - OU - Both Eyes    Intravitreal Injection, Pharmacologic Agent - OD - Right Eye    Intravitreal Injection, Pharmacologic Agent - OS - Left Eye    faricimab-svoa (VABYSMO) 6mg /0.38mL intravitreal injection    aflibercept (EYLEA) SOLN 2 mg    2. Posterior vitreous detachment of both eyes  H43.813     3. Essential hypertension  I10     4. Hypertensive retinopathy of both eyes  H35.033     5. Pseudophakia of both eyes  Z96.1     6. Ocular hypertension, bilateral  H40.053      7. Dry eyes  H04.123       1. Exudative age-related macular degeneration, both eyes - severe exudative disease with very active CNVM OU at presentation in January 2019  - S/P IVA OD #1 (01.04.19), #2 (02.15.19)  - S/P IVA OS #1 (01.18.19), #2 (02.15.19)  - switched to Eylea 3.18.19 due to severity of disease - S/P IVE OD #1 (03.18.19), #2 (04.17.19), #3 (05.15.19), #4 (10.02.19),  #5 (01.15.20), #6 (06.04.20), #7 (10.29.20), #8 (01.21.21), #9 (05.27.21), #10 (07.22.21) -- injections held due to stable disciform scar, #11 (08.12.24) for new IRH, #12 (09.25.24) - S/P IVE OS #1 (03.18.19), #2 (04.17.19), #3 (05.15.19), #4 (06.12.19), #5 (07.10.19), #6 (08.07.19), #7 (09.04.19), #8 (10.02.19), #9 (11.06.19), #10 (12.11.19), #11 (01.15.20), #12 (02.20.20), #13 (03.26.20), #14 (04.30.20), #15 (06.04.20), #16 (07.16.20), # 17 (08.20.20), #18 (09.24.20), #19 (10.29.20), #20 (12.03.20), #21 (01.21.21), #22 (03.18.21), #23 (05.27.21), #24 (07.22.21), #25 (11.11.21), #26 (12.09.21), #27 (01.06.22), #28 (2.7.22), #29 (03.24.22), #30 (4.28.22), #31 (05.31.22), #32 (06.30.22), #33 (08.04.22), #34 (09.15.22), #35 (10.27.22), #36 (12.08.22), #37 (01.12.23), #38 (02.16.23), #39 (03.30.23), #40 (05.11.23), #41 (06.22.23), #42 (07.27.23), #43 (09.01.23), #44 (10.06.23), #45 (11.10.23), #46 (12.15.23) -- IVE resistance  - s/p IVV OS #1 (01.12.24 -- sample), #2 (02.09.24), #3 (03.15.24), #4 (04.19.24), #5 (05.24.24), #6 (07.01.24), #7 (08.12.24), #8 (09.25.24)  - IVE OS held from 7.22.21 to 11.11.21 due to TIA/stroke on 8.1.21 - OCT OD: persistent cystic  changes overlying stable central SRHM / disciform scar; OS: trace persistent cystic changes nasal fovea and macula overlying PED/SRHM at 6 weeks  - exam OD with focal IRH superior macula within atrophy -- improved  - BCVA OD stable at CF 3'; OS improved to 20/60 from 20/70  - recommend IVE OD #13 and IVV OS #9 today, 11.06.24 w/ f/u at 6 wks again - IVV  authorization obtained - RBA of procedure discussed, questions answered - IVV informed consent obtained and signed on 01.12.24 - IVE informed consent obtained and signed on 08.12.24 - see procedure note   - Good Days approved for 2024 - f/u in 6 weeks, sooner prn for DFE/OCT/possible injection(s)  2. PVD / vitreous syneresis  - Discussed findings and prognosis   - No RT or RD on 360 exam  - Reviewed s/s of RT/RD  - strict return precautions for any such RT/RD symptoms  3,4. Hypertensive retinopathy OU - exam shows persistent focal IRH OD within GA - discussed importance of tight BP control - monitor  5. Pseudophakia OU  - s/p CE/IOL (Dr. June Leap, 11.2020)  - beautiful surgeries w/ IOLs in excellent position, doing well  - monitor  6. Ocular Hypertension OU  - IOP 16,14  - continue cosopt bid OU  7. Dry eyes OU - recommend artificial tears and lubricating ointment as needed  Ophthalmic Meds Ordered this visit:  Meds ordered this encounter  Medications   faricimab-svoa (VABYSMO) 6mg /0.13mL intravitreal injection   aflibercept (EYLEA) SOLN 2 mg     Return in about 6 weeks (around 11/22/2023) for f/u exu ARMD OU, DFE, OCT.  There are no Patient Instructions on file for this visit.  This document serves as a record of services personally performed by Karie Chimera, MD, PhD. It was created on their behalf by De Blanch, an ophthalmic technician. The creation of this record is the provider's dictation and/or activities during the visit.    Electronically signed by: De Blanch, OA, 10/12/23  3:07 PM  This document serves as a record of services personally performed by Karie Chimera, MD, PhD. It was created on their behalf by Glee Arvin. Manson Passey, OA an ophthalmic technician. The creation of this record is the provider's dictation and/or activities during the visit.    Electronically signed by: Glee Arvin. Manson Passey, OA 10/12/23 3:07 PM  Karie Chimera, M.D.,  Ph.D. Diseases & Surgery of the Retina and Vitreous [facility] 10/11/2023   I have reviewed the above documentation for accuracy and completeness, and I agree with the above. Karie Chimera, M.D., Ph.D. 10/12/23 3:07 PM  Abbreviations: M myopia (nearsighted); A astigmatism; H hyperopia (farsighted); P presbyopia; Mrx spectacle prescription;  CTL contact lenses; OD right eye; OS left eye; OU both eyes  XT exotropia; ET esotropia; PEK punctate epithelial keratitis; PEE punctate epithelial erosions; DES dry eye syndrome; MGD meibomian gland dysfunction; ATs artificial tears; PFAT's preservative free artificial tears; NSC nuclear sclerotic cataract; PSC posterior subcapsular cataract; ERM epi-retinal membrane; PVD posterior vitreous detachment; RD retinal detachment; DM diabetes mellitus; DR diabetic retinopathy; NPDR non-proliferative diabetic retinopathy; PDR proliferative diabetic retinopathy; CSME clinically significant macular edema; DME diabetic macular edema; dbh dot blot hemorrhages; CWS cotton wool spot; POAG primary open angle glaucoma; C/D cup-to-disc ratio; HVF humphrey visual field; GVF goldmann visual field; OCT optical coherence tomography; IOP intraocular pressure; BRVO Branch retinal vein occlusion; CRVO central retinal vein occlusion; CRAO central retinal artery occlusion; BRAO branch retinal artery  occlusion; RT retinal tear; SB scleral buckle; PPV pars plana vitrectomy; VH Vitreous hemorrhage; PRP panretinal laser photocoagulation; IVK intravitreal kenalog; VMT vitreomacular traction; MH Macular hole;  NVD neovascularization of the disc; NVE neovascularization elsewhere; AREDS age related eye disease study; ARMD age related macular degeneration; POAG primary open angle glaucoma; EBMD epithelial/anterior basement membrane dystrophy; ACIOL anterior chamber intraocular lens; IOL intraocular lens; PCIOL posterior chamber intraocular lens; Phaco/IOL phacoemulsification  with intraocular lens placement; PRK photorefractive keratectomy; LASIK laser assisted in situ keratomileusis; HTN hypertension; DM diabetes mellitus; COPD chronic obstructive pulmonary disease

## 2023-10-11 ENCOUNTER — Ambulatory Visit (INDEPENDENT_AMBULATORY_CARE_PROVIDER_SITE_OTHER): Payer: Medicare HMO | Admitting: Ophthalmology

## 2023-10-11 ENCOUNTER — Encounter (INDEPENDENT_AMBULATORY_CARE_PROVIDER_SITE_OTHER): Payer: Self-pay | Admitting: Ophthalmology

## 2023-10-11 DIAGNOSIS — H04123 Dry eye syndrome of bilateral lacrimal glands: Secondary | ICD-10-CM | POA: Diagnosis not present

## 2023-10-11 DIAGNOSIS — Z961 Presence of intraocular lens: Secondary | ICD-10-CM

## 2023-10-11 DIAGNOSIS — H43813 Vitreous degeneration, bilateral: Secondary | ICD-10-CM

## 2023-10-11 DIAGNOSIS — H35033 Hypertensive retinopathy, bilateral: Secondary | ICD-10-CM | POA: Diagnosis not present

## 2023-10-11 DIAGNOSIS — I10 Essential (primary) hypertension: Secondary | ICD-10-CM

## 2023-10-11 DIAGNOSIS — H353231 Exudative age-related macular degeneration, bilateral, with active choroidal neovascularization: Secondary | ICD-10-CM

## 2023-10-11 DIAGNOSIS — H40053 Ocular hypertension, bilateral: Secondary | ICD-10-CM | POA: Diagnosis not present

## 2023-10-11 MED ORDER — AFLIBERCEPT 2MG/0.05ML IZ SOLN FOR KALEIDOSCOPE
2.0000 mg | INTRAVITREAL | Status: AC | PRN
  Administered 2023-10-11: 2 mg via INTRAVITREAL

## 2023-10-11 MED ORDER — FARICIMAB-SVOA 6 MG/0.05ML IZ SOSY
6.0000 mg | PREFILLED_SYRINGE | INTRAVITREAL | Status: AC | PRN
  Administered 2023-10-11: 6 mg via INTRAVITREAL

## 2023-10-13 ENCOUNTER — Other Ambulatory Visit: Payer: Self-pay | Admitting: Family Medicine

## 2023-10-13 DIAGNOSIS — I10 Essential (primary) hypertension: Secondary | ICD-10-CM

## 2023-10-19 ENCOUNTER — Inpatient Hospital Stay: Payer: Medicare HMO

## 2023-10-19 ENCOUNTER — Inpatient Hospital Stay: Payer: Medicare HMO | Attending: Hematology

## 2023-10-19 VITALS — BP 123/62 | HR 69 | Temp 98.1°F | Resp 16 | Wt 170.2 lb

## 2023-10-19 DIAGNOSIS — C182 Malignant neoplasm of ascending colon: Secondary | ICD-10-CM | POA: Insufficient documentation

## 2023-10-19 DIAGNOSIS — C189 Malignant neoplasm of colon, unspecified: Secondary | ICD-10-CM

## 2023-10-19 DIAGNOSIS — Z5112 Encounter for antineoplastic immunotherapy: Secondary | ICD-10-CM | POA: Insufficient documentation

## 2023-10-19 DIAGNOSIS — Z79620 Long term (current) use of immunosuppressive biologic: Secondary | ICD-10-CM | POA: Diagnosis not present

## 2023-10-19 LAB — CBC WITH DIFFERENTIAL/PLATELET
Abs Immature Granulocytes: 0.03 10*3/uL (ref 0.00–0.07)
Basophils Absolute: 0 10*3/uL (ref 0.0–0.1)
Basophils Relative: 0 %
Eosinophils Absolute: 0.3 10*3/uL (ref 0.0–0.5)
Eosinophils Relative: 5 %
HCT: 37.2 % (ref 36.0–46.0)
Hemoglobin: 11.8 g/dL — ABNORMAL LOW (ref 12.0–15.0)
Immature Granulocytes: 0 %
Lymphocytes Relative: 19 %
Lymphs Abs: 1.3 10*3/uL (ref 0.7–4.0)
MCH: 29.4 pg (ref 26.0–34.0)
MCHC: 31.7 g/dL (ref 30.0–36.0)
MCV: 92.8 fL (ref 80.0–100.0)
Monocytes Absolute: 0.6 10*3/uL (ref 0.1–1.0)
Monocytes Relative: 9 %
Neutro Abs: 4.5 10*3/uL (ref 1.7–7.7)
Neutrophils Relative %: 67 %
Platelets: 156 10*3/uL (ref 150–400)
RBC: 4.01 MIL/uL (ref 3.87–5.11)
RDW: 13.4 % (ref 11.5–15.5)
WBC: 6.8 10*3/uL (ref 4.0–10.5)
nRBC: 0 % (ref 0.0–0.2)

## 2023-10-19 LAB — COMPREHENSIVE METABOLIC PANEL
ALT: 12 U/L (ref 0–44)
AST: 17 U/L (ref 15–41)
Albumin: 4.1 g/dL (ref 3.5–5.0)
Alkaline Phosphatase: 77 U/L (ref 38–126)
Anion gap: 11 (ref 5–15)
BUN: 18 mg/dL (ref 8–23)
CO2: 27 mmol/L (ref 22–32)
Calcium: 9.6 mg/dL (ref 8.9–10.3)
Chloride: 100 mmol/L (ref 98–111)
Creatinine, Ser: 1.08 mg/dL — ABNORMAL HIGH (ref 0.44–1.00)
GFR, Estimated: 53 mL/min — ABNORMAL LOW (ref >60)
Glucose, Bld: 94 mg/dL (ref 70–99)
Potassium: 3.8 mmol/L (ref 3.5–5.1)
Sodium: 138 mmol/L (ref 135–145)
Total Bilirubin: 0.5 mg/dL (ref <1.2)
Total Protein: 7.6 g/dL (ref 6.5–8.1)

## 2023-10-19 LAB — MAGNESIUM: Magnesium: 2.2 mg/dL (ref 1.7–2.4)

## 2023-10-19 MED ORDER — SODIUM CHLORIDE 0.9% FLUSH
10.0000 mL | INTRAVENOUS | Status: DC | PRN
  Administered 2023-10-19 (×2): 10 mL

## 2023-10-19 MED ORDER — HEPARIN SOD (PORK) LOCK FLUSH 100 UNIT/ML IV SOLN
500.0000 [IU] | Freq: Once | INTRAVENOUS | Status: AC | PRN
  Administered 2023-10-19: 500 [IU]

## 2023-10-19 MED ORDER — SODIUM CHLORIDE 0.9 % IV SOLN
200.0000 mg | Freq: Once | INTRAVENOUS | Status: AC
  Administered 2023-10-19: 200 mg via INTRAVENOUS
  Filled 2023-10-19: qty 8

## 2023-10-19 MED ORDER — SODIUM CHLORIDE 0.9 % IV SOLN: Freq: Once | INTRAVENOUS | Status: AC

## 2023-10-19 NOTE — Patient Instructions (Signed)
[facility] - [facility]  Discharge Instructions: Thank you for choosing [facility] to provide your oncology and hematology care.  If you have a lab appointment with the Cancer Center - please note that after April 8th, 2024, all labs will be drawn in the cancer center.  You do not have to check in or register with the main entrance as you have in the past but will complete your check-in in the cancer center.  Wear comfortable clothing and clothing appropriate for easy access to any Portacath or PICC line.   We strive to give you quality time with your provider. You may need to reschedule your appointment if you arrive late (15 or more minutes).  Arriving late affects you and other patients whose appointments are after yours.  Also, if you miss three or more appointments without notifying the office, you may be dismissed from the clinic at the provider's discretion.      For prescription refill requests, have your pharmacy contact our office and allow 72 hours for refills to be completed.    Today you received the following chemotherapy and/or immunotherapy agents Keytruda   To help prevent nausea and vomiting after your treatment, we encourage you to take your nausea medication as directed.  Pembrolizumab Injection What is this medication? PEMBROLIZUMAB (PEM broe LIZ ue mab) treats some types of cancer. It works by helping your immune system slow or stop the spread of cancer cells. It is a monoclonal antibody. This medicine may be used for other purposes; ask your health care provider or pharmacist if you have questions. COMMON BRAND NAME(S): Keytruda What should I tell my care team before I take this medication? They need to know if you have any of these conditions: Allogeneic stem cell transplant (uses someone else's stem cells) Autoimmune diseases, such as Crohn disease, ulcerative colitis, lupus History of chest  radiation Nervous system problems, such as Guillain-Barre syndrome, myasthenia gravis Organ transplant An unusual or allergic reaction to pembrolizumab, other medications, foods, dyes, or preservatives Pregnant or trying to get pregnant Breast-feeding How should I use this medication? This medication is injected into a vein. It is given by your care team in a hospital or clinic setting. A special MedGuide will be given to you before each treatment. Be sure to read this information carefully each time. Talk to your care team about the use of this medication in children. While it may be prescribed for children as young as 6 months for selected conditions, precautions do apply. Overdosage: If you think you have taken too much of this medicine contact a poison control center or emergency room at once. NOTE: This medicine is only for you. Do not share this medicine with others. What if I miss a dose? Keep appointments for follow-up doses. It is important not to miss your dose. Call your care team if you are unable to keep an appointment. What may interact with this medication? Interactions have not been studied. This list may not describe all possible interactions. Give your health care provider a list of all the medicines, herbs, non-prescription drugs, or dietary supplements you use. Also tell them if you smoke, drink alcohol, or use illegal drugs. Some items may interact with your medicine. What should I watch for while using this medication? Your condition will be monitored carefully while you are receiving this medication. You may need blood work while taking this medication. This medication may cause serious  skin reactions. They can happen weeks to months after starting the medication. Contact your care team right away if you notice fevers or flu-like symptoms with a rash. The rash may be red or purple and then turn into blisters or peeling of the skin. You may also notice a red rash with  swelling of the face, lips, or lymph nodes in your neck or under your arms. Tell your care team right away if you have any change in your eyesight. Talk to your care team if you may be pregnant. Serious birth defects can occur if you take this medication during pregnancy and for 4 months after the last dose. You will need a negative pregnancy test before starting this medication. Contraception is recommended while taking this medication and for 4 months after the last dose. Your care team can help you find the option that works for you. Do not breastfeed while taking this medication and for 4 months after the last dose. What side effects may I notice from receiving this medication? Side effects that you should report to your care team as soon as possible: Allergic reactions--skin rash, itching, hives, swelling of the face, lips, tongue, or throat Dry cough, shortness of breath or trouble breathing Eye pain, redness, irritation, or discharge with blurry or decreased vision Heart muscle inflammation--unusual weakness or fatigue, shortness of breath, chest pain, fast or irregular heartbeat, dizziness, swelling of the ankles, feet, or hands Hormone gland problems--headache, sensitivity to light, unusual weakness or fatigue, dizziness, fast or irregular heartbeat, increased sensitivity to cold or heat, excessive sweating, constipation, hair loss, increased thirst or amount of urine, tremors or shaking, irritability Infusion reactions--chest pain, shortness of breath or trouble breathing, feeling faint or lightheaded Kidney injury (glomerulonephritis)--decrease in the amount of urine, red or dark brown urine, foamy or bubbly urine, swelling of the ankles, hands, or feet Liver injury--right upper belly pain, loss of appetite, nausea, light-colored stool, dark yellow or brown urine, yellowing skin or eyes, unusual weakness or fatigue Pain, tingling, or numbness in the hands or feet, muscle weakness, change in  vision, confusion or trouble speaking, loss of balance or coordination, trouble walking, seizures Rash, fever, and swollen lymph nodes Redness, blistering, peeling, or loosening of the skin, including inside the mouth Sudden or severe stomach pain, bloody diarrhea, fever, nausea, vomiting Side effects that usually do not require medical attention (report to your care team if they continue or are bothersome): Bone, joint, or muscle pain Diarrhea Fatigue Loss of appetite Nausea Skin rash This list may not describe all possible side effects. Call your doctor for medical advice about side effects. You may report side effects to FDA at 1-800-FDA-1088. Where should I keep my medication? This medication is given in a hospital or clinic. It will not be stored at home. NOTE: This sheet is a summary. It may not cover all possible information. If you have questions about this medicine, talk to your doctor, pharmacist, or health care provider.  2024 Elsevier/Gold Standard (2022-04-05 00:00:00)   BELOW ARE SYMPTOMS THAT SHOULD BE REPORTED IMMEDIATELY: *FEVER GREATER THAN 100.4 F (38 C) OR HIGHER *CHILLS OR SWEATING *NAUSEA AND VOMITING THAT IS NOT CONTROLLED WITH YOUR NAUSEA MEDICATION *UNUSUAL SHORTNESS OF BREATH *UNUSUAL BRUISING OR BLEEDING *URINARY PROBLEMS (pain or burning when urinating, or frequent urination) *BOWEL PROBLEMS (unusual diarrhea, constipation, pain near the anus) TENDERNESS IN MOUTH AND THROAT WITH OR WITHOUT PRESENCE OF ULCERS (sore throat, sores in mouth, or a toothache) UNUSUAL RASH, SWELLING OR PAIN  UNUSUAL VAGINAL DISCHARGE OR ITCHING   Items with * indicate a potential emergency and should be followed up as soon as possible or go to the Emergency Department if any problems should occur.  Please show the CHEMOTHERAPY ALERT CARD or IMMUNOTHERAPY ALERT CARD at check-in to the Emergency Department and triage nurse.  Should you have questions after your visit or need to  cancel or reschedule your appointment, please contact [facility] - A DEPT OF [facility] Bridegroom [facility] [phone]  and follow the prompts.  Office hours are 8:00 a.m. to 4:30 p.m. Monday - Friday. Please note that voicemails left after 4:00 p.m. may not be returned until the following business day.  We are closed weekends and major holidays. You have access to a nurse at all times for urgent questions. Please call the main number to the clinic [phone] and follow the prompts.  For any non-urgent questions, you may also contact your provider using MyChart. We now offer e-Visits for anyone 25 and older to request care online for non-urgent symptoms. For details visit [email or web address].   Also download the MyChart app! Go to the app store, search "MyChart", open the app, select [place], and log in with your MyChart username and password.

## 2023-10-19 NOTE — Progress Notes (Signed)
Patient presents today for Keytruda infusion.  Patient is in satisfactory condition with no new complaints voiced.  Vital signs are stable.  Labs reviewed and all labs are within treatment parameters.  We will proceed with treatment per MD orders.    Treatment given today per MD orders. Tolerated infusion without adverse affects. Vital signs stable. No complaints at this time. Discharged from clinic ambulatory in stable condition. Alert and oriented x 3. F/U with [facility] as scheduled.

## 2023-11-09 ENCOUNTER — Inpatient Hospital Stay: Payer: Medicare HMO | Attending: Hematology

## 2023-11-09 ENCOUNTER — Inpatient Hospital Stay: Payer: Medicare HMO

## 2023-11-09 VITALS — BP 144/67 | HR 73 | Temp 98.0°F | Resp 18 | Wt 171.8 lb

## 2023-11-09 DIAGNOSIS — C182 Malignant neoplasm of ascending colon: Secondary | ICD-10-CM | POA: Diagnosis not present

## 2023-11-09 DIAGNOSIS — Z79620 Long term (current) use of immunosuppressive biologic: Secondary | ICD-10-CM | POA: Diagnosis not present

## 2023-11-09 DIAGNOSIS — Z5112 Encounter for antineoplastic immunotherapy: Secondary | ICD-10-CM | POA: Insufficient documentation

## 2023-11-09 DIAGNOSIS — Z79899 Other long term (current) drug therapy: Secondary | ICD-10-CM

## 2023-11-09 DIAGNOSIS — C189 Malignant neoplasm of colon, unspecified: Secondary | ICD-10-CM

## 2023-11-09 LAB — CBC WITH DIFFERENTIAL/PLATELET
Abs Immature Granulocytes: 0.03 10*3/uL (ref 0.00–0.07)
Basophils Absolute: 0 10*3/uL (ref 0.0–0.1)
Basophils Relative: 0 %
Eosinophils Absolute: 0.3 10*3/uL (ref 0.0–0.5)
Eosinophils Relative: 3 %
HCT: 35.7 % — ABNORMAL LOW (ref 36.0–46.0)
Hemoglobin: 11.6 g/dL — ABNORMAL LOW (ref 12.0–15.0)
Immature Granulocytes: 0 %
Lymphocytes Relative: 18 %
Lymphs Abs: 1.4 10*3/uL (ref 0.7–4.0)
MCH: 30.1 pg (ref 26.0–34.0)
MCHC: 32.5 g/dL (ref 30.0–36.0)
MCV: 92.7 fL (ref 80.0–100.0)
Monocytes Absolute: 0.7 10*3/uL (ref 0.1–1.0)
Monocytes Relative: 9 %
Neutro Abs: 5.4 10*3/uL (ref 1.7–7.7)
Neutrophils Relative %: 70 %
Platelets: 162 10*3/uL (ref 150–400)
RBC: 3.85 MIL/uL — ABNORMAL LOW (ref 3.87–5.11)
RDW: 13.8 % (ref 11.5–15.5)
WBC: 7.8 10*3/uL (ref 4.0–10.5)
nRBC: 0 % (ref 0.0–0.2)

## 2023-11-09 LAB — COMPREHENSIVE METABOLIC PANEL
ALT: 13 U/L (ref 0–44)
AST: 17 U/L (ref 15–41)
Albumin: 3.8 g/dL (ref 3.5–5.0)
Alkaline Phosphatase: 82 U/L (ref 38–126)
Anion gap: 9 (ref 5–15)
BUN: 16 mg/dL (ref 8–23)
CO2: 27 mmol/L (ref 22–32)
Calcium: 9.8 mg/dL (ref 8.9–10.3)
Chloride: 103 mmol/L (ref 98–111)
Creatinine, Ser: 1.16 mg/dL — ABNORMAL HIGH (ref 0.44–1.00)
GFR, Estimated: 49 mL/min — ABNORMAL LOW (ref >60)
Glucose, Bld: 106 mg/dL — ABNORMAL HIGH (ref 70–99)
Potassium: 3.7 mmol/L (ref 3.5–5.1)
Sodium: 139 mmol/L (ref 135–145)
Total Bilirubin: 0.3 mg/dL (ref <1.2)
Total Protein: 7.5 g/dL (ref 6.5–8.1)

## 2023-11-09 LAB — TSH: TSH: 1.609 u[IU]/mL (ref 0.350–4.500)

## 2023-11-09 LAB — MAGNESIUM: Magnesium: 2.1 mg/dL (ref 1.7–2.4)

## 2023-11-09 MED ORDER — HEPARIN SOD (PORK) LOCK FLUSH 100 UNIT/ML IV SOLN
500.0000 [IU] | Freq: Once | INTRAVENOUS | Status: AC | PRN
  Administered 2023-11-09: 500 [IU]

## 2023-11-09 MED ORDER — SODIUM CHLORIDE 0.9 % IV SOLN: Freq: Once | INTRAVENOUS | Status: AC

## 2023-11-09 MED ORDER — PEMBROLIZUMAB CHEMO INJECTION 100 MG/4ML
200.0000 mg | Freq: Once | INTRAVENOUS | Status: AC
  Administered 2023-11-09: 200 mg via INTRAVENOUS
  Filled 2023-11-09: qty 200

## 2023-11-09 MED ORDER — SODIUM CHLORIDE 0.9% FLUSH
10.0000 mL | INTRAVENOUS | Status: DC | PRN
Start: 2023-11-09 — End: 2023-11-09
  Administered 2023-11-09: 10 mL

## 2023-11-09 NOTE — Patient Instructions (Signed)
[facility] [place] [facility]  Discharge Instructions: Thank you for choosing [facility] to provide your oncology and hematology care.  If you have a lab appointment with the Cancer Center - please note that after April 8th, 2024, all labs will be drawn in the cancer center.  You do not have to check in or register with the main entrance as you have in the past but will complete your check-in in the cancer center.  Wear comfortable clothing and clothing appropriate for easy access to any Portacath or PICC line.   We strive to give you quality time with your provider. You may need to reschedule your appointment if you arrive late (15 or more minutes).  Arriving late affects you and other patients whose appointments are after yours.  Also, if you miss three or more appointments without notifying the office, you may be dismissed from the clinic at the provider's discretion.      For prescription refill requests, have your pharmacy contact our office and allow 72 hours for refills to be completed.    Today you received the following chemotherapy and/or immunotherapy agents Keytruda   To help prevent nausea and vomiting after your treatment, we encourage you to take your nausea medication as directed.  BELOW ARE SYMPTOMS THAT SHOULD BE REPORTED IMMEDIATELY: *FEVER GREATER THAN 100.4 F (38 C) OR HIGHER *CHILLS OR SWEATING *NAUSEA AND VOMITING THAT IS NOT CONTROLLED WITH YOUR NAUSEA MEDICATION *UNUSUAL SHORTNESS OF BREATH *UNUSUAL BRUISING OR BLEEDING *URINARY PROBLEMS (pain or burning when urinating, or frequent urination) *BOWEL PROBLEMS (unusual diarrhea, constipation, pain near the anus) TENDERNESS IN MOUTH AND THROAT WITH OR WITHOUT PRESENCE OF ULCERS (sore throat, sores in mouth, or a toothache) UNUSUAL RASH, SWELLING OR PAIN  UNUSUAL VAGINAL DISCHARGE OR ITCHING   Items with * indicate a potential emergency and should be followed up as  soon as possible or go to the Emergency Department if any problems should occur.  Please show the CHEMOTHERAPY ALERT CARD or IMMUNOTHERAPY ALERT CARD at check-in to the Emergency Department and triage nurse.  Should you have questions after your visit or need to cancel or reschedule your appointment, please contact [facility] [place] - A DEPT OF [facility] [phone]  and follow the prompts.  Office hours are 8:00 a.m. to 4:30 p.m. Monday - Friday. Please note that voicemails left after 4:00 p.m. may not be returned until the following business day.  We are closed weekends and major holidays. You have access to a nurse at all times for urgent questions. Please call the main number to the clinic [phone] and follow the prompts.  For any non-urgent questions, you may also contact your provider using MyChart. We now offer e-Visits for anyone 5 and older to request care online for non-urgent symptoms. For details visit [email or web address].   Also download the MyChart app! Go to the app store, search "MyChart", open the app, select , and log in with your MyChart username and password.

## 2023-11-09 NOTE — Progress Notes (Signed)
Patient presents today for Keytruda infusion.  Patient is in satisfactory condition with no new complaints voiced.  Vital signs are stable.  Labs reviewed and all labs are within treatment parameters.  We will proceed with treatment per MD orders.    Treatment given today per MD orders. Tolerated infusion without adverse affects. Vital signs stable. No complaints at this time. Discharged from clinic ambulatory in stable condition. Alert and oriented x 3. F/U with [facility] as scheduled.

## 2023-11-10 LAB — CEA: CEA: 1.9 ng/mL (ref 0.0–4.7)

## 2023-11-16 NOTE — Progress Notes (Signed)
[facility] - Clinic Note  11/22/2023    CHIEF COMPLAINT Patient presents for Retina Follow Up  HISTORY OF PRESENT ILLNESS: Gabrielle [name] is a 77 y.o. female who presents to the clinic today for:   HPI     Retina Follow Up   Patient presents with  Wet AMD.  In both eyes.  Severity is moderate.  Duration of 6 weeks.  Since onset it is stable.  I, the attending physician,  performed the HPI with the patient and updated documentation appropriately.        Comments   Pt here for 6 wk ret f/u exu ARMD OU. Pt states VA is stable.       Last edited by [name], MD on 11/22/2023  9:28 AM.     Patient states she has been seeing small floaters in her vision, she thinks she's getting a cold  Referring physician: [name], DO [address] [place],  [place] [address]  HISTORICAL INFORMATION:   Selected notes from the MEDICAL RECORD NUMBER Referred by Dr. Mercie Eon for concern of ARMD OU   CURRENT MEDICATIONS: Current Outpatient Medications (Ophthalmic Drugs)  Medication Sig   dorzolamide-timolol (COSOPT) 2-0.5 % ophthalmic solution INSTILL ONE DROP IN [facility] EYE TWICE DAILY   No current facility-administered medications for this visit. (Ophthalmic Drugs)   Current Outpatient Medications (Other)  Medication Sig   alendronate (FOSAMAX) 70 MG tablet TAKE ONE TABLET EVERY 7 DAYS ON AN EMPTY STOMACH WITH A FULL GLASS OF WATER   amLODipine (NORVASC) 10 MG tablet Take 1 tablet (10 mg total) by mouth daily. For blood pressure   atorvastatin (LIPITOR) 20 MG tablet Take 1 tablet (20 mg total) by mouth daily. For cholesterol   cholecalciferol (VITAMIN D3) 25 MCG (1000 UT) tablet Take 1,000 Units by mouth daily.   CRANBERRY FRUIT PO Take 1 tablet by mouth daily.   diclofenac Sodium (VOLTAREN) 1 % GEL APPLY 4 GRAMS TO AFFECTED AREA(S) 4 TIMES A DAY (Patient taking differently: Apply 4 g topically 4 (four) times daily as needed (Pain).)   ELIQUIS 5 MG TABS  tablet Take 1 tablet (5 mg total) by mouth 2 (two) times daily.   Ferrous Sulfate (IRON) 325 (65 Fe) MG TABS Take 1 tablet (325 mg total) by mouth daily.   fluticasone (FLONASE) 50 MCG/ACT nasal spray Place 2 sprays into both nostrils daily.   guaiFENesin-codeine 100-10 MG/5ML syrup Take 5 mLs by mouth 3 (three) times daily as needed for cough.   hydrALAZINE (APRESOLINE) 25 MG tablet Take 1 tablet (25 mg total) by mouth 2 (two) times daily.   Multiple Vitamins-Minerals (CENTRUM ADULT PO) Take 1 tablet by mouth daily.   Potassium Chloride ER 20 MEQ TBCR Take 1 tablet (20 mEq total) by mouth daily. Take while taking hydrochlorothiazide/HCTZ   No current facility-administered medications for this visit. (Other)   REVIEW OF SYSTEMS: ROS   Positive for: Gastrointestinal, Genitourinary, Musculoskeletal, Cardiovascular, Eyes Negative for: Constitutional, Neurological, Skin, HENT, Endocrine, Respiratory, Psychiatric, Allergic/Imm, Heme/Lymph Last edited by Thompson Grayer, COT on 11/22/2023  7:44 AM.      ALLERGIES Allergies  Allergen Reactions   Lisinopril Cough   PAST MEDICAL HISTORY Past Medical History:  Diagnosis Date   Arthritis    Cancer [facility])    Cataract    OU   Colon cancer (HCC)    Family history of pancreatic cancer 08/21/2020   Family history of uterine cancer 08/21/2020  H/O cesarean section    Hx of tonsillectomy    Hyperlipidemia    Hypertension    Kidney disease    Macular degeneration    Exu ARMD OU   Paroxysmal atrial fibrillation (HCC) 07/05/2020   Stroke (cerebrum) (HCC) 07/05/2020   Urinary retention 04/15/2020   Past Surgical History:  Procedure Laterality Date   BIOPSY  04/06/2020   Procedure: BIOPSY;  Surgeon: Malissa Hippo, MD;  Location: [facility];  Service: Endoscopy;;   CARPAL TUNNEL RELEASE Right    CATARACT EXTRACTION W/PHACO Left 10/11/2019   Procedure: CATARACT EXTRACTION PHACO AND INTRAOCULAR LENS PLACEMENT (IOC);  Surgeon: Fabio Pierce, MD;  Location: [facility] OR[facility];  Service: Ophthalmology;  Laterality: Left;  CDE: 8.56   CATARACT EXTRACTION W/PHACO Right 10/25/2019   Procedure: CATARACT EXTRACTION PHACO AND INTRAOCULAR LENS PLACEMENT (IOC);  Surgeon: Fabio Pierce, MD;  Location: [facility];  Service: Ophthalmology;  Laterality: Right;  CDE: 5.67   CESAREAN SECTION     COLONOSCOPY N/A 04/06/2020   Procedure: COLONOSCOPY;  Surgeon: Malissa Hippo, MD;  Location: [facility];  Service: Endoscopy;  Laterality: N/A;   CYSTOSCOPY WITH BIOPSY N/A 04/08/2020   Procedure: CYSTOSCOPY WITH BIOPSY;  Surgeon: Malen Gauze, MD;  Location: [facility];  Service: Urology;  Laterality: N/A;   ESOPHAGOGASTRODUODENOSCOPY N/A 04/05/2020   Procedure: ESOPHAGOGASTRODUODENOSCOPY (EGD);  Surgeon: Malissa Hippo, MD;  [facility]: [facility];  Service: Endoscopy;  Laterality: N/A;   PARTIAL COLECTOMY N/A 04/08/2020   Procedure: PARTIAL COLECTOMY;  Surgeon: Lucretia Roers, MD;  Location: [facility];  Service: General;  Laterality: N/A;   PORTACATH PLACEMENT Left 05/18/2020   Procedure: INSERTION PORT-A-CATH (ATTACHED CATHETER IN LEFT SUBCLAVIAN);  Surgeon: Lucretia Roers, MD;  Location: [facility];  Service: General;  Laterality: Left;   TONSILLECTOMY     FAMILY HISTORY Family History  Problem Relation Age of Onset   Macular degeneration Mother    Stroke Mother    Hypertension Mother    Dementia Mother    Lung cancer Father        dx late 62s; smoking hx   Diabetes Sister    Hypertension Sister    Leukemia Paternal Uncle        d. 3s   Cancer Maternal Aunt        ovarian or endometrial dx 60s   Pancreatic cancer Paternal Uncle        d. late 79s   SOCIAL HISTORY Social History   Tobacco Use   Smoking status: Never   Smokeless tobacco: Never  Vaping Use   Vaping status: Never Used  Substance Use Topics   Alcohol use: No   Drug use: No       OPHTHALMIC EXAM: Base Eye Exam     Visual Acuity (Snellen - Linear)       Right Left    Dist cc CF at 3' 20/60 -2   Dist ph cc NI NI    Correction: Glasses         Tonometry (Tonopen, 7:47 AM)       Right Left   Pressure 13 12         Pupils       Pupils Dark Light Shape React APD   Right PERRL 3 2 Round Brisk None   Left PERRL 3 2 Round Brisk None         Visual Fields (Counting fingers)       Left Right  Full Full         Extraocular Movement       Right Left    Full, Ortho Full, Ortho         Neuro/Psych     Oriented x3: Yes   Mood/Affect: Normal         Dilation     Both eyes: 1.0% Mydriacyl, 2.5% Phenylephrine @ 7:48 AM           Slit Lamp and Fundus Exam     Slit Lamp Exam       Right Left   Lids/Lashes Dermatochalasis - upper lid Dermatochalasis - upper lid   Conjunctiva/Sclera 1+ Injection Trace Injection   Cornea Arcus, 3+ fine Punctpate epithelial erosions Arcus, 3+ fine Punctate epithelial erosions, mild tear film debris   Anterior Chamber Deep and quiet Deep and quiet   Iris Round and well dilated Round and well dilated   Lens PCIOL in good position, 1+ Posterior capsular opacification PC IOL in good position with open PC   Anterior Vitreous Vitreous syneresis, Posterior vitreous detachment, vitreous condensations Vitreous syneresis, Posterior vitreous detachment, silicone oil micro drops         Fundus Exam       Right Left   Disc pink and sharp, compact mild pallor, sharp rim, Compact, vascular loops superiorly   C/D Ratio 0.2 0.2   Macula Blunted foveal reflex, large central GA, Drusen, Pigment clumping and atrophy, central thickening/pigmented disciform scar, +PED, punctate IRH superior macula within atrophy -- stably resolved, persistent trace cystic changes overlying central scar Blunted foveal reflex, central thickening, RPE clumping and atrophy, Drusen, trace cystic changes overlying PED -- persistent, no heme   Vessels Vascular attenuation, Tortuous Vascular attenuation, Tortuous   Periphery  Attached, mild Reticular degeneration Attached, mild Reticular degeneration           Refraction     Wearing Rx       Sphere Cylinder Add   Right [place] Sphere +3.50   Left -0.50 Sphere +3.50    Type: PAL           IMAGING AND PROCEDURES  Imaging and Procedures for 03/21/18  OCT, Retina - OU - Both Eyes       Right Eye Quality was good. Central Foveal Thickness: 494. Progression has been stable. Findings include no SRF, abnormal foveal contour, retinal drusen , outer retinal tubulation, subretinal hyper-reflective material, disciform scar, epiretinal membrane, intraretinal fluid, pigment epithelial detachment, outer retinal atrophy (Persistent cystic changes overlying stable central SRHM / disciform scar).   Left Eye Quality was good. Central Foveal Thickness: 281. Progression has been stable. Findings include no SRF, abnormal foveal contour, retinal drusen , subretinal hyper-reflective material, intraretinal fluid, pigment epithelial detachment, outer retinal atrophy (Trace persistent cystic changes nasal fovea and macula overlying PED/SRHM ).   Notes *Images captured and stored on drive  Diagnosis / Impression:  Exudative ARMD OU OD: persistent cystic changes overlying stable central SRHM / disciform scar OS: trace persistent cystic changes nasal fovea and macula overlying PED/SRHM  Clinical management:  See below  Abbreviations: NFP - Normal foveal profile. CME - cystoid macular edema. PED - pigment epithelial detachment. IRF - intraretinal fluid. SRF - subretinal fluid. EZ - ellipsoid zone. ERM - epiretinal membrane. ORA - outer retinal atrophy. ORT - outer retinal tubulation. SRHM - subretinal hyper-reflective material      Intravitreal Injection, Pharmacologic Agent - OS - Left Eye       Time Out 11/22/2023.  8:13 AM. Confirmed correct patient, procedure, site, and patient consented.   Anesthesia Topical anesthesia was used. Anesthetic medications included  Lidocaine 2%, Proparacaine 0.5%.   Procedure Preparation included 5% betadine to ocular surface, eyelid speculum. A supplied (32g) needle was used.   Injection: 6 mg faricimab-svoa 6 MG/0.05ML   Route: Intravitreal, Site: Left Eye   NDC: 02725-366-44, Lot: [id number], Expiration date: 04/03/2025, Waste: 0 mL   Post-op Post injection exam found visual acuity of at least counting fingers. The patient tolerated the procedure well. There were no complications. The patient received written and verbal post procedure care education. Post injection medications were not given.            ASSESSMENT/PLAN:    ICD-10-CM   1. Exudative age-related macular degeneration of both eyes with active choroidal neovascularization (HCC)  H35.3231 OCT, Retina - OU - Both Eyes    Intravitreal Injection, Pharmacologic Agent - OS - Left Eye    faricimab-svoa (VABYSMO) 6mg /0.49mL intravitreal injection    2. Posterior vitreous detachment of both eyes  H43.813     3. Essential hypertension  I10     4. Hypertensive retinopathy of both eyes  H35.033     5. Pseudophakia of both eyes  Z96.1     6. Ocular hypertension, bilateral  H40.053     7. Dry eyes  H04.123      1. Exudative age-related macular degeneration, both eyes - severe exudative disease with very active CNVM OU at presentation in January 2019  - S/P IVA OD #1 (01.04.19), #2 (02.15.19)  - S/P IVA OS #1 (01.18.19), #2 (02.15.19)  - switched to Eylea 3.18.19 due to severity of disease - S/P IVE OD #1 (03.18.19), #2 (04.17.19), #3 (05.15.19), #4 (10.02.19),  #5 (01.15.20), #6 (06.04.20), #7 (10.29.20), #8 (01.21.21), #9 (05.27.21), #10 (07.22.21) -- injections held due to stable disciform scar, #11 (08.12.24) for new IRH, #12 (09.25.24), #13 (11.06.24) - S/P IVE OS #1 (03.18.19), #2 (04.17.19), #3 (05.15.19), #4 (06.12.19), #5 (07.10.19), #6 (08.07.19), #7 (09.04.19), #8 (10.02.19), #9 (11.06.19), #10 (12.11.19), #11 (01.15.20), #12 (02.20.20), #13  (03.26.20), #14 (04.30.20), #15 (06.04.20), #16 (07.16.20), # 17 (08.20.20), #18 (09.24.20), #19 (10.29.20), #20 (12.03.20), #21 (01.21.21), #22 (03.18.21), #23 (05.27.21), #24 (07.22.21), #25 (11.11.21), #26 (12.09.21), #27 (01.06.22), #28 (2.7.22), #29 (03.24.22), #30 (4.28.22), #31 (05.31.22), #32 (06.30.22), #33 (08.04.22), #34 (09.15.22), #35 (10.27.22), #36 (12.08.22), #37 (01.12.23), #38 (02.16.23), #39 (03.30.23), #40 (05.11.23), #41 (06.22.23), #42 (07.27.23), #43 (09.01.23), #44 (10.06.23), #45 (11.10.23), #46 (12.15.23) -- IVE resistance  - s/p IVV OS #1 (01.12.24 -- sample), #2 (02.09.24), #3 (03.15.24), #4 (04.19.24), #5 (05.24.24), #6 (07.01.24), #7 (08.12.24), #8 (09.25.24), #9 (11.06.24)  - IVE OS held from 7.22.21 to 11.11.21 due to TIA/stroke on 8.1.21 - OCT OD: persistent cystic changes overlying stable central SRHM / disciform scar; OS: trace persistent cystic changes nasal fovea and macula overlying PED/SRHM at 6 weeks  - exam OD with focal IRH superior macula within atrophy -- improved  - BCVA OD stable at CF 3'; OS stable at 20/60  - recommend IVV OS #10 today, 12.18.24 w/ f/u at 6 wks again - IVV authorization obtained - will hold IVE OD today -- pt in agreement - RBA of procedure discussed, questions answered - IVV informed consent obtained and signed on 01.12.24 - IVE informed consent obtained and signed on 09.25.24 - see procedure note   - Good Days approved for 2024 - f/u in 6 weeks, sooner prn for DFE/OCT/possible injection(s)  2. PVD /  vitreous syneresis  - Discussed findings and prognosis   - No RT or RD on 360 exam  - Reviewed s/s of RT/RD  - strict return precautions for any such RT/RD symptoms  3,4. Hypertensive retinopathy OU - exam shows persistent focal IRH OD within GA - discussed importance of tight BP control - monitor  5. Pseudophakia OU  - s/p CE/IOL (Dr. June Leap, 11.2020)  - beautiful surgeries w/ IOLs in excellent position, doing well  -  monitor  6. Ocular Hypertension OU  - IOP 13,12  - continue cosopt bid OU  7. Dry eyes OU - recommend artificial tears and lubricating ointment as needed  Ophthalmic Meds Ordered this visit:  Meds ordered this encounter  Medications   faricimab-svoa (VABYSMO) 6mg /0.80mL intravitreal injection     Return in about 6 weeks (around 01/03/2024) for f/u exu ARMD OU, DFE, OCT.  There are no Patient Instructions on file for this visit.  This document serves as a record of services personally performed by Karie Chimera, MD, PhD. It was created on their behalf by De Blanch, an ophthalmic technician. The creation of this record is the provider's dictation and/or activities during the visit.    Electronically signed by: De Blanch, OA, 11/24/23  12:42 AM  This document serves as a record of services personally performed by Karie Chimera, MD, PhD. It was created on their behalf by Glee Arvin. Manson Passey, OA an ophthalmic technician. The creation of this record is the provider's dictation and/or activities during the visit.    Electronically signed by: Glee Arvin. Manson Passey, OA 11/24/23 12:42 AM   Karie Chimera, M.D., Ph.D. Diseases & Surgery of the Retina and Vitreous [facility] 11/22/2023   I have reviewed the above documentation for accuracy and completeness, and I agree with the above. Karie Chimera, M.D., Ph.D. 11/24/23 12:43 AM   Abbreviations: M myopia (nearsighted); A astigmatism; H hyperopia (farsighted); P presbyopia; Mrx spectacle prescription;  CTL contact lenses; OD right eye; OS left eye; OU both eyes  XT exotropia; ET esotropia; PEK punctate epithelial keratitis; PEE punctate epithelial erosions; DES dry eye syndrome; MGD meibomian gland dysfunction; ATs artificial tears; PFAT's preservative free artificial tears; NSC nuclear sclerotic cataract; PSC posterior subcapsular cataract; ERM epi-retinal membrane; PVD posterior vitreous detachment; RD  retinal detachment; DM diabetes mellitus; DR diabetic retinopathy; NPDR non-proliferative diabetic retinopathy; PDR proliferative diabetic retinopathy; CSME clinically significant macular edema; DME diabetic macular edema; dbh dot blot hemorrhages; CWS cotton wool spot; POAG primary open angle glaucoma; C/D cup-to-disc ratio; HVF humphrey visual field; GVF goldmann visual field; OCT optical coherence tomography; IOP intraocular pressure; BRVO Branch retinal vein occlusion; CRVO central retinal vein occlusion; CRAO central retinal artery occlusion; BRAO branch retinal artery occlusion; RT retinal tear; SB scleral buckle; PPV pars plana vitrectomy; VH Vitreous hemorrhage; PRP panretinal laser photocoagulation; IVK intravitreal kenalog; VMT vitreomacular traction; MH Macular hole;  NVD neovascularization of the disc; NVE neovascularization elsewhere; AREDS age related eye disease study; ARMD age related macular degeneration; POAG primary open angle glaucoma; EBMD epithelial/anterior basement membrane dystrophy; ACIOL anterior chamber intraocular lens; IOL intraocular lens; PCIOL posterior chamber intraocular lens; Phaco/IOL phacoemulsification with intraocular lens placement; PRK photorefractive keratectomy; LASIK laser assisted in situ keratomileusis; HTN hypertension; DM diabetes mellitus; COPD chronic obstructive pulmonary disease

## 2023-11-17 DIAGNOSIS — R809 Proteinuria, unspecified: Secondary | ICD-10-CM | POA: Diagnosis not present

## 2023-11-17 DIAGNOSIS — C189 Malignant neoplasm of colon, unspecified: Secondary | ICD-10-CM | POA: Diagnosis not present

## 2023-11-17 DIAGNOSIS — E871 Hypo-osmolality and hyponatremia: Secondary | ICD-10-CM | POA: Diagnosis not present

## 2023-11-17 DIAGNOSIS — I5032 Chronic diastolic (congestive) heart failure: Secondary | ICD-10-CM | POA: Diagnosis not present

## 2023-11-17 DIAGNOSIS — N2581 Secondary hyperparathyroidism of renal origin: Secondary | ICD-10-CM | POA: Diagnosis not present

## 2023-11-22 ENCOUNTER — Encounter (INDEPENDENT_AMBULATORY_CARE_PROVIDER_SITE_OTHER): Payer: Self-pay | Admitting: Ophthalmology

## 2023-11-22 ENCOUNTER — Ambulatory Visit (INDEPENDENT_AMBULATORY_CARE_PROVIDER_SITE_OTHER): Payer: Medicare HMO | Admitting: Ophthalmology

## 2023-11-22 DIAGNOSIS — I10 Essential (primary) hypertension: Secondary | ICD-10-CM | POA: Diagnosis not present

## 2023-11-22 DIAGNOSIS — H43813 Vitreous degeneration, bilateral: Secondary | ICD-10-CM

## 2023-11-22 DIAGNOSIS — H353231 Exudative age-related macular degeneration, bilateral, with active choroidal neovascularization: Secondary | ICD-10-CM

## 2023-11-22 DIAGNOSIS — H35033 Hypertensive retinopathy, bilateral: Secondary | ICD-10-CM

## 2023-11-22 DIAGNOSIS — Z961 Presence of intraocular lens: Secondary | ICD-10-CM

## 2023-11-22 DIAGNOSIS — H04123 Dry eye syndrome of bilateral lacrimal glands: Secondary | ICD-10-CM

## 2023-11-22 DIAGNOSIS — H40053 Ocular hypertension, bilateral: Secondary | ICD-10-CM

## 2023-11-22 DIAGNOSIS — E119 Type 2 diabetes mellitus without complications: Secondary | ICD-10-CM | POA: Diagnosis not present

## 2023-11-22 MED ORDER — FARICIMAB-SVOA 6 MG/0.05ML IZ SOSY
6.0000 mg | PREFILLED_SYRINGE | INTRAVITREAL | Status: AC | PRN
  Administered 2023-11-22: 6 mg via INTRAVITREAL

## 2023-11-23 NOTE — Progress Notes (Deleted)
Name: Gabrielle [name] DOB: [birth year] MRN: [id number]  History of Present Illness: Gabrielle [name] is a 77 y.o. female who presents today as a new patient / to re-establish care at [facility] [place]. Previously seen by Dr. Ronne Binning in 2021. ***She is accompanied by ***. - GU History: 1. Bladder mass. - 04/08/2020: Underwent cystoscopy with bladder biopsy; pathology was benign. Found to have "Diffuse erythema and nodularity worse at the dome".  2. Prior episode of urinary retention.  She reports chief complaint of ***microscopic hematuria.  Recent history:  > 03/30/2023:  - UA: 3+ leukocytes and 3+ blood; nitrite negative.  - Negative urine culture.  > 07/28/2023:  - Urine microscopy with >50 WBC/hpf, >50 RBC/hpf, bacteria (rare).  - Negative urine culture.  > 09/22/2023: CT chest/abdomen/pelvis w/ contrast showed moderately distended and mildly thick bladder wall - no change from prior. No GU stones, masses, or hydronephrosis.  ***schedule for cystoscopy  Today: She {Actions; denies-reports:120008} increased urinary urgency, frequency, nocturia, dysuria, gross hematuria, hesitancy, straining to void, or sensations of incomplete emptying. She {Actions; denies-reports:120008} flank pain or abdominal pain. She {Actions; denies-reports:120008} fevers, nausea, or vomiting.  Her past medical history includes uterine cancer, colon cancer, CKD stage 3. She {Actions; denies-reports:120008} history of smoking (quit ***; has smoked *** ppd x*** years). She {Actions; denies-reports:120008} taking anticoagulants (Eliquis).  Fall Screening: Do you usually have a device to assist in your mobility? {yes/no:20286} ***cane / ***walker / ***wheelchair  Medications: Current Outpatient Medications  Medication Sig Dispense Refill   alendronate (FOSAMAX) 70 MG tablet TAKE ONE TABLET EVERY 7 DAYS ON AN EMPTY STOMACH WITH A FULL GLASS OF WATER 12 tablet 3   amLODipine (NORVASC) 10 MG tablet  Take 1 tablet (10 mg total) by mouth daily. For blood pressure 90 tablet 3   atorvastatin (LIPITOR) 20 MG tablet Take 1 tablet (20 mg total) by mouth daily. For cholesterol 90 tablet 3   cholecalciferol (VITAMIN D3) 25 MCG (1000 UT) tablet Take 1,000 Units by mouth daily.     CRANBERRY FRUIT PO Take 1 tablet by mouth daily.     diclofenac Sodium (VOLTAREN) 1 % GEL APPLY 4 GRAMS TO AFFECTED AREA(S) 4 TIMES A DAY (Patient taking differently: Apply 4 g topically 4 (four) times daily as needed (Pain).) 400 g 2   dorzolamide-timolol (COSOPT) 2-0.5 % ophthalmic solution INSTILL ONE DROP IN EACH EYE TWICE DAILY 10 mL 10   ELIQUIS 5 MG TABS tablet Take 1 tablet (5 mg total) by mouth 2 (two) times daily. 180 tablet 3   Ferrous Sulfate (IRON) 325 (65 Fe) MG TABS Take 1 tablet (325 mg total) by mouth daily. 90 tablet 3   fluticasone (FLONASE) 50 MCG/ACT nasal spray Place 2 sprays into both nostrils daily. 16 g 6   guaiFENesin-codeine 100-10 MG/5ML syrup Take 5 mLs by mouth 3 (three) times daily as needed for cough. 120 mL 0   hydrALAZINE (APRESOLINE) 25 MG tablet Take 1 tablet (25 mg total) by mouth 2 (two) times daily. 180 tablet 3   Multiple Vitamins-Minerals (CENTRUM ADULT PO) Take 1 tablet by mouth daily.     Potassium Chloride ER 20 MEQ TBCR Take 1 tablet (20 mEq total) by mouth daily. Take while taking hydrochlorothiazide/HCTZ 30 tablet 1   No current facility-administered medications for this visit.    Allergies: Allergies  Allergen Reactions   Lisinopril Cough    Past Medical History:  Diagnosis Date   Arthritis    Cancer (HCC)  Cataract    OU   Colon cancer [facility])    Family history of pancreatic cancer 08/21/2020   Family history of uterine cancer 08/21/2020   H/O cesarean section    Hx of tonsillectomy    Hyperlipidemia    Hypertension    Kidney disease    Macular degeneration    Exu ARMD OU   Paroxysmal atrial fibrillation (HCC) 07/05/2020   Stroke (cerebrum) (HCC) 07/05/2020    Urinary retention 04/15/2020   Past Surgical History:  Procedure Laterality Date   BIOPSY  04/06/2020   Procedure: BIOPSY;  Surgeon: Malissa Hippo, MD;  [facility];  Service: Endoscopy;;   CARPAL TUNNEL RELEASE Right    CATARACT EXTRACTION W/PHACO Left 10/11/2019   Procedure: CATARACT EXTRACTION PHACO AND INTRAOCULAR LENS PLACEMENT (IOC);  Surgeon: Fabio Pierce, MD;  Location: [facility];  Service: Ophthalmology;  Laterality: Left;  CDE: 8.56   CATARACT EXTRACTION W/PHACO Right 10/25/2019   Procedure: CATARACT EXTRACTION PHACO AND INTRAOCULAR LENS PLACEMENT (IOC);  Surgeon: Fabio Pierce, MD;  Location: [facility];  Service: Ophthalmology;  Laterality: Right;  CDE: 5.67   CESAREAN SECTION     COLONOSCOPY N/A 04/06/2020   Procedure: COLONOSCOPY;  Surgeon: Malissa Hippo, MD;  [facility]: [facility];  Service: Endoscopy;  Laterality: N/A;   CYSTOSCOPY WITH BIOPSY N/A 04/08/2020   Procedure: CYSTOSCOPY WITH BIOPSY;  Surgeon: Malen Gauze, MD;  Location: [facility];  Service: Urology;  Laterality: N/A;   ESOPHAGOGASTRODUODENOSCOPY N/A 04/05/2020   Procedure: ESOPHAGOGASTRODUODENOSCOPY (EGD);  Surgeon: Malissa Hippo, MD;  [facility];  Service: Endoscopy;  Laterality: N/A;   PARTIAL COLECTOMY N/A 04/08/2020   Procedure: PARTIAL COLECTOMY;  Surgeon: Lucretia Roers, MD;  Location: [facility];  Service: General;  Laterality: N/A;   PORTACATH PLACEMENT Left 05/18/2020   Procedure: INSERTION PORT-A-CATH (ATTACHED CATHETER IN LEFT SUBCLAVIAN);  Surgeon: Lucretia Roers, MD;  Location: [facility];  Service: General;  Laterality: Left;   TONSILLECTOMY     Family History  Problem Relation Age of Onset   Macular degeneration Mother    Stroke Mother    Hypertension Mother    Dementia Mother    Lung cancer Father        dx late 88s; smoking hx   Diabetes Sister    Hypertension Sister    Leukemia Paternal Uncle        d. 66s   Cancer Maternal Aunt        ovarian or  endometrial dx 44s   Pancreatic cancer Paternal Uncle        d. late 52s   Social History   Socioeconomic History   Marital status: Divorced    Spouse name: Not on file   Number of children: Not on file   Years of education: Not on file   Highest education level: Not on file  Occupational History   Not on file  Tobacco Use   Smoking status: Never   Smokeless tobacco: Never  Vaping Use   Vaping status: Never Used  Substance and Sexual Activity   Alcohol use: No   Drug use: No   Sexual activity: Not Currently  Other Topics Concern   Not on file  Social History Narrative   Not on file   Social Drivers of Health   Financial Resource Strain: Low Risk  (03/15/2023)   Overall Financial Resource Strain (CARDIA)    Difficulty of Paying Living Expenses: Not hard at all  Food  Insecurity: No Food Insecurity (08/01/2023)   Hunger Vital Sign    Worried About Running Out of Food in the Last Year: Never true    Ran Out of Food in the Last Year: Never true  Transportation Needs: No Transportation Needs (08/01/2023)   PRAPARE - Administrator, Civil Service (Medical): No    Lack of Transportation (Non-Medical): No  Physical Activity: Sufficiently Active (03/15/2023)   Exercise Vital Sign    Days of Exercise per Week: 5 days    Minutes of Exercise per Session: 30 min  Stress: No Stress Concern Present (03/15/2023)   [employer] of Occupational Health - Occupational Stress Questionnaire    Feeling of Stress : Not at all  Social Connections: Moderately Integrated (03/15/2023)   Social Connection and Isolation Panel [NHANES]    Frequency of Communication with Friends and Family: More than three times a week    Frequency of Social Gatherings with Friends and Family: More than three times a week    Attends Religious Services: More than 4 times per year    Active Member of [employer] or Organizations: Yes    Attends Banker Meetings: More than 4 times per year     Marital Status: Widowed  Intimate Partner Violence: Not At Risk (07/28/2023)   Humiliation, Afraid, Rape, and Kick questionnaire    Fear of Current or Ex-Partner: No    Emotionally Abused: No    Physically Abused: No    Sexually Abused: No    SUBJECTIVE  Review of Systems*** Constitutional: Patient denies any unintentional weight loss or change in strength lntegumentary: Patient denies any rashes or pruritus Cardiovascular: Patient denies chest pain or syncope Respiratory: Patient denies shortness of breath Gastrointestinal: Patient ***denies nausea, vomiting, constipation, or diarrhea Musculoskeletal: Patient denies muscle cramps or weakness Neurologic: Patient denies convulsions or seizures Allergic/Immunologic: Patient denies recent allergic reaction(s) Hematologic/Lymphatic: Patient denies bleeding tendencies Endocrine: Patient denies heat/cold intolerance  GU: As per HPI.  OBJECTIVE There were no vitals filed for this visit. There is no height or weight on file to calculate BMI.  Physical Examination*** Constitutional: No obvious distress; patient is non-toxic appearing  Cardiovascular: No visible lower extremity edema.  Respiratory: The patient does not have audible wheezing/stridor; respirations do not appear labored  Gastrointestinal: Abdomen non-distended Musculoskeletal: Normal ROM of UEs  Skin: No obvious rashes/open sores  Neurologic: CN 2-12 grossly intact Psychiatric: Answered questions appropriately with normal affect  Hematologic/Lymphatic/Immunologic: No obvious bruises or sites of spontaneous bleeding  Urine microscopy: ***negative *** WBC/hpf, *** RBC/hpf, *** bacteria UA: ***negative *** WBC/hpf, *** RBC/hpf, *** bacteria ***with no evidence of UTI ***with no evidence of microscopic hematuria ***otherwise unremarkable  PVR: *** ml  ASSESSMENT No diagnosis found.  For asymptomatic microscopic hematuria we discussed possible etiologies including but  not limited to: vigorous exercise, sexual activity, stone, trauma, blood thinner use, urinary tract infection, urethral irritation secondary to ***vaginal atrophy, chronic kidney disease, glomerulonephropathy, ***BPH, ***radiation cystitis, malignancy. ***We discussed pt's smoking as a risk factor for GU cancer and encouraged ***continued smoking cessation.***  We reviewed the AUA 2020 [facility] guideline and risk stratification for this patient. Based on individual risk factors, pt was advised that the recommended workup includes ***repeat UA in 6 months ***RUS ***CT urogram ***cystoscopy. Pt decided to pursue this work-up and follow-up afterward to discuss the results and formulate a treatment plan based on the findings. All questions were answered.  *** AUA 2020 [facility]H guideline     PLAN  Advised the following: ***RUS ***CT ordered. 2. ***No follow-ups on file.  No orders of the defined types were placed in this encounter.   It has been explained that the patient is to follow regularly with their PCP in addition to all other providers involved in their care and to follow instructions provided by these respective offices. Patient advised to contact urology clinic if any urologic-pertaining questions, concerns, new symptoms or problems arise in the interim period.  There are no Patient Instructions on file for this visit.  Electronically signed by:  Donnita Falls, MSN, FNP-C, CUNP 11/23/2023 1:16 PM

## 2023-11-30 ENCOUNTER — Inpatient Hospital Stay: Payer: Medicare HMO

## 2023-11-30 ENCOUNTER — Other Ambulatory Visit: Payer: Self-pay | Admitting: Nurse Practitioner

## 2023-11-30 ENCOUNTER — Inpatient Hospital Stay: Payer: Medicare HMO | Admitting: Hematology

## 2023-11-30 VITALS — BP 151/71 | HR 68 | Temp 97.6°F | Resp 18

## 2023-11-30 VITALS — BP 152/75 | HR 73 | Temp 97.7°F | Resp 18 | Wt 168.2 lb

## 2023-11-30 DIAGNOSIS — C189 Malignant neoplasm of colon, unspecified: Secondary | ICD-10-CM

## 2023-11-30 DIAGNOSIS — C182 Malignant neoplasm of ascending colon: Secondary | ICD-10-CM | POA: Diagnosis not present

## 2023-11-30 DIAGNOSIS — Z5112 Encounter for antineoplastic immunotherapy: Secondary | ICD-10-CM | POA: Diagnosis not present

## 2023-11-30 DIAGNOSIS — Z79620 Long term (current) use of immunosuppressive biologic: Secondary | ICD-10-CM | POA: Diagnosis not present

## 2023-11-30 DIAGNOSIS — J069 Acute upper respiratory infection, unspecified: Secondary | ICD-10-CM

## 2023-11-30 LAB — CBC WITH DIFFERENTIAL/PLATELET
Abs Immature Granulocytes: 0.02 10*3/uL (ref 0.00–0.07)
Basophils Absolute: 0 10*3/uL (ref 0.0–0.1)
Basophils Relative: 1 %
Eosinophils Absolute: 0.3 10*3/uL (ref 0.0–0.5)
Eosinophils Relative: 5 %
HCT: 38.9 % (ref 36.0–46.0)
Hemoglobin: 12.6 g/dL (ref 12.0–15.0)
Immature Granulocytes: 0 %
Lymphocytes Relative: 26 %
Lymphs Abs: 1.6 10*3/uL (ref 0.7–4.0)
MCH: 29.9 pg (ref 26.0–34.0)
MCHC: 32.4 g/dL (ref 30.0–36.0)
MCV: 92.2 fL (ref 80.0–100.0)
Monocytes Absolute: 0.6 10*3/uL (ref 0.1–1.0)
Monocytes Relative: 9 %
Neutro Abs: 3.6 10*3/uL (ref 1.7–7.7)
Neutrophils Relative %: 59 %
Platelets: 158 10*3/uL (ref 150–400)
RBC: 4.22 MIL/uL (ref 3.87–5.11)
RDW: 14 % (ref 11.5–15.5)
WBC: 6.1 10*3/uL (ref 4.0–10.5)
nRBC: 0 % (ref 0.0–0.2)

## 2023-11-30 LAB — COMPREHENSIVE METABOLIC PANEL
ALT: 13 U/L (ref 0–44)
AST: 17 U/L (ref 15–41)
Albumin: 4.1 g/dL (ref 3.5–5.0)
Alkaline Phosphatase: 73 U/L (ref 38–126)
Anion gap: 8 (ref 5–15)
BUN: 16 mg/dL (ref 8–23)
CO2: 28 mmol/L (ref 22–32)
Calcium: 9.8 mg/dL (ref 8.9–10.3)
Chloride: 101 mmol/L (ref 98–111)
Creatinine, Ser: 1.14 mg/dL — ABNORMAL HIGH (ref 0.44–1.00)
GFR, Estimated: 50 mL/min — ABNORMAL LOW (ref >60)
Glucose, Bld: 113 mg/dL — ABNORMAL HIGH (ref 70–99)
Potassium: 3.5 mmol/L (ref 3.5–5.1)
Sodium: 137 mmol/L (ref 135–145)
Total Bilirubin: 0.5 mg/dL (ref <1.2)
Total Protein: 7.7 g/dL (ref 6.5–8.1)

## 2023-11-30 LAB — MAGNESIUM: Magnesium: 2.2 mg/dL (ref 1.7–2.4)

## 2023-11-30 MED ORDER — SODIUM CHLORIDE 0.9% FLUSH
10.0000 mL | INTRAVENOUS | Status: DC | PRN
  Administered 2023-11-30: 10 mL

## 2023-11-30 MED ORDER — HEPARIN SOD (PORK) LOCK FLUSH 100 UNIT/ML IV SOLN
500.0000 [IU] | Freq: Once | INTRAVENOUS | Status: AC | PRN
  Administered 2023-11-30: 500 [IU]

## 2023-11-30 MED ORDER — SODIUM CHLORIDE 0.9 % IV SOLN
200.0000 mg | Freq: Once | INTRAVENOUS | Status: AC
  Administered 2023-11-30: 200 mg via INTRAVENOUS
  Filled 2023-11-30: qty 200

## 2023-11-30 MED ORDER — SODIUM CHLORIDE 0.9 % IV SOLN: Freq: Once | INTRAVENOUS | Status: AC

## 2023-11-30 NOTE — Progress Notes (Signed)
Patient presents today for Keytruda infusion per providers order.  Vital signs and labs reviewed by MD.  Message received from Anastasio Champion RN/Dr. Delton Coombes, patient okay for treatment.  Treatment given today per MD orders.  Stable during infusion without adverse affects.  Vital signs stable.  No complaints at this time.  Discharge from clinic ambulatory in stable condition.  Alert and oriented X 3.  Follow up with [facility] as scheduled.

## 2023-11-30 NOTE — Progress Notes (Signed)
[facility] [address], [place] [address]    Clinic Day:  11/30/23    Referring physician: [name], DO  Patient Care Team: [name], DO as PCP - General (Family Medicine) [name], MD as Consulting Physician (Retina Ophthalmology) Fabio Pierce, MD as Consulting Physician (Ophthalmology) [place] Bali, MD (Inactive) as Consulting Physician (Gastroenterology) Doreatha Massed, MD as Medical Oncologist (Oncology)   ASSESSMENT & PLAN:   Assessment: 1.  Stage IIIb (T4AN1A) poorly differentiated right colon adenocarcinoma: -Right hemicolectomy on 04/07/2020 with poorly differentiated adenocarcinoma, pT4a, positive radial margin, 1/15 lymph nodes positive, loss of MLH1 and PMS2, bladder biopsy negative for malignancy. -CTAP on 04/06/2020 showed circumferential ascending colon mass with numerous borderline enlarged pericolonic lymph nodes.  No findings of hepatic metastatic disease. -CEA on 04/04/2020 was 12.2.  CEA improved to 1.7 on 05/13/2020. -PET scan on 05/11/2020 shows mild FDG uptake associated with the anastomotic site.  Small pulmonary nodules that do not show elevated FDG activity, remain nonspecific, subcentimeter.  Persistent but improved bladder wall thickening. -Cycle 1 of dose reduced FOLFOX on 05/19/2020, followed by 2 hospitalizations, 1 from acute kidney injury from diarrhea and second admission for C. difficile colitis. -Cycle 2 of 5-FU and leucovorin dose reduced on 06/30/2020.  Chemotherapy discontinued secondary to intolerance. -Follow-up CT scan on 11/02/2020 with multiple soft tissue lesions in the central and right mesentery measuring up to 3.1 x 1.7 cm.  Mild lymphadenopathy in the hepatic duodenal ligament, retroperitoneal space, right common iliac chain, bilateral external iliac chains.  17 mm soft tissue nodule in the lower anterior abdominal wall.  Bilateral tiny pulmonary nodules not substantially changed.  2.9 x 2.5  cm low-density lesion along the posterior uterus is indeterminate. -Pembrolizumab started on 11/10/2020. -CT CAP from 02/09/2021 showed interval near complete resolution of previously demonstrated nodal mass in the ileocolonic mesentery.  Residual ill-defined nodule medial to the tip of the right hepatic lobe.  Stable small pulmonary nodules bilaterally consistent with benign findings.   2.  Family history: -Paternal uncle had colon cancer.  Maternal aunt had brain cancer and another maternal aunt had gynecological malignancy.  Father had lung cancer and was a smoker. - Genetic testing did not reveal any notable mutations.   3.  Diffuse erythema and nodularity of the dome of the bladder: -CT scan showed severely thickened bladder wall. -Cystoscopy on 04/08/2020 showed diffuse erythema, nodularity in the posterior wall tracking to the dome.  Ureteral orifices were in normal locations.  Biopsies were benign.    Plan: 1.  Stage IV right colon adenocarcinoma, MSI-high: - CT CAP (09/22/2023): Stable small lung nodules with no evidence of thoracic or abdominal metastatic disease. - He is tolerating Keytruda without any immunotherapy related side effects. - Reviewed labs today: Normal LFTs.  Creatinine 1.14 and stable.  CBC was normal.  TSH is 1.6.  CEA is 1.9. - She may proceed with her treatment today.  I will arrange for a scan in 3 weeks. - If her CT scan did not show any evidence of recurrence, we will discontinue immunotherapy as she has received it for 3 years.  We will continue close monitoring with CT every 3 months initially and spread it out every 6 months with CEA level.  She is agreeable.   2.  Paroxysmal atrial fibrillation: - Continue Eliquis indefinitely.  No bleeding issues.   3.  Hypertension: - Continue Hyzaar and amlodipine.  Blood pressure is 150/75.  4.  Hypokalemia: - Continue potassium 20 mill equivalents daily.  Potassium is 3.5.    Orders Placed This Encounter   Procedures   CT CHEST ABDOMEN PELVIS W CONTRAST    Standing Status:   Future    Expected Date:   12/14/2023    Expiration Date:   11/29/2024    If indicated for the ordered procedure, I authorize the administration of contrast media per Radiology protocol:   Yes    Does the patient have a contrast media/X-ray dye allergy?:   No    Preferred imaging location?:   [facility]    Release to patient:   Immediate    If indicated for the ordered procedure, I authorize the administration of oral contrast media per Radiology protocol:   Yes   Magnesium    Standing Status:   Future    Expected Date:   12/21/2023    Expiration Date:   12/20/2024   CBC with Differential    Standing Status:   Future    Expected Date:   12/21/2023    Expiration Date:   12/21/2024   Comprehensive metabolic panel    Standing Status:   Future    Expected Date:   12/21/2023    Expiration Date:   12/21/2024       Mikeal Hawthorne R Teague,acting as a scribe for Doreatha Massed, MD.,have documented all relevant documentation on the behalf of Doreatha Massed, MD,as directed by  Doreatha Massed, MD while in the presence of Doreatha Massed, MD.  I, Doreatha Massed MD, have reviewed the above documentation for accuracy and completeness, and I agree with the above.       Doreatha Massed, MD   12/26/20242:05 PM  CHIEF COMPLAINT:   Diagnosis: right colon cancer    Cancer Staging  Malignant neoplasm of colon [facility]) Staging form: Colon and Rectum, AJCC 8th Edition - Clinical stage from 04/27/2020: Stage IIIB (cT4a, cN1a, cM0) - Unsigned - Pathologic stage from 11/05/2020: Stage IVC (rpTX, pN0, pM1c) - Signed by Doreatha Massed, MD on 11/05/2020    Prior Therapy: Rande Lawman every 3 weeks   Current Therapy:  COLORECTAL Pembrolizumab    HISTORY OF PRESENT ILLNESS:   Oncology History  Malignant neoplasm of colon (HCC)  04/07/2020 Initial Diagnosis   Malignant neoplasm of ascending colon  (HCC)   05/13/2020 Genetic Testing   [facility]     05/19/2020 - 07/02/2020 Chemotherapy   The patient had palonosetron (ALOXI) injection 0.25 mg, 0.25 mg, Intravenous,  Once, 2 of 12 cycles Administration: 0.25 mg (05/19/2020), 0.25 mg (06/30/2020) leucovorin 522 mg in dextrose 5 % 250 mL infusion, 320 mg/m2 = 522 mg (80 % of original dose 400 mg/m2), Intravenous,  Once, 2 of 12 cycles Dose modification: 320 mg/m2 (80 % of original dose 400 mg/m2, Cycle 1, Reason: Provider Judgment), 161.0960 mg/m2 (66.7 % of original dose 400 mg/m2, Cycle 2, Reason: Provider Judgment) Administration: 522 mg (05/19/2020), 434 mg (06/30/2020) oxaliplatin (ELOXATIN) 110 mg in dextrose 5 % 500 mL chemo infusion, 68 mg/m2 = 110 mg (80 % of original dose 85 mg/m2), Intravenous,  Once, 1 of 1 cycle Dose modification: 68 mg/m2 (80 % of original dose 85 mg/m2, Cycle 1, Reason: Provider Judgment) Administration: 110 mg (05/19/2020) fluorouracil (ADRUCIL) chemo injection 500 mg, 320 mg/m2 = 500 mg (80 % of original dose 400 mg/m2), Intravenous,  Once, 2 of 12 cycles Dose modification: 320 mg/m2 (80 % of original dose 400 mg/m2, Cycle 1, Reason: Provider Judgment), 454.0981 mg/m2 (  66.7 % of original dose 400 mg/m2, Cycle 2, Reason: Provider Judgment) Administration: 500 mg (05/19/2020), 450 mg (06/30/2020) fluorouracil (ADRUCIL) 3,150 mg in sodium chloride 0.9 % 87 mL chemo infusion, 1,920 mg/m2 = 3,150 mg (80 % of original dose 2,400 mg/m2), Intravenous, 1 Day/Dose, 2 of 12 cycles Dose modification: 1,920 mg/m2 (80 % of original dose 2,400 mg/m2, Cycle 1, Reason: Provider Judgment), 1,600 mg/m2 (66.7 % of original dose 2,400 mg/m2, Cycle 2, Reason: Provider Judgment) Administration: 3,150 mg (05/19/2020), 2,600 mg (06/30/2020)  for chemotherapy treatment.    08/03/2020 Genetic Testing   [facility] Reveal Testing     08/14/2020 Genetic Testing   No pathogenic variants detected in [facility] Common Hereditary Cancers Panel.   Variant of uncertain significance (VUS) detected in HOXB13 at c.634G>A (p.Ala212Thr). The Common Hereditary Cancers Panel offered by [facility] includes sequencing and/or deletion duplication testing of the following 48 genes: APC, ATM, AXIN2, BARD1, BMPR1A, BRCA1, BRCA2, BRIP1, CDH1, CDK4, CDKN2A (p14ARF), CDKN2A (p16INK4a), CHEK2, CTNNA1, DICER1, EPCAM (Deletion/duplication testing only), FLCN, GREM1 (promoter region deletion/duplication testing only), KIT, MEN1, MLH1, MSH2, MSH3, MSH6, MUTYH, NBN, NF1, NHTL1, PALB2, PDGFRA, PMS2, POLD1, POLE, PTEN, RAD50, RAD51C, RAD51D, RNF43, SDHB, SDHC, SDHD, SMAD4, SMARCA4. STK11, TP53, TSC1, TSC2, and VHL.  The following genes were evaluated for sequence changes only: SDHA and HOXB13 c.251G>A variant only. The report date is August 14, 2020.    11/05/2020 Cancer Staging   Staging form: Colon and Rectum, AJCC 8th Edition - Pathologic stage from 11/05/2020: Stage IVC (rpTX, pN0, pM1c) - Signed by Doreatha Massed, MD on 11/05/2020   11/10/2020 - 07/13/2022 Chemotherapy   Patient is on Treatment Plan : COLORECTAL Pembrolizumab q21d     11/10/2020 -  Chemotherapy   Patient is on Treatment Plan : COLORECTAL Pembrolizumab (200) q21d        INTERVAL HISTORY:   Shaylynne is a 77 y.o. female presenting to clinic today for follow up of right colon cancer. She was last seen by me on 09/28/23.  Today, she states that she is doing well overall. Her appetite level is at 100%. Her energy level is at 90%.   PAST MEDICAL HISTORY:   Past Medical History: Past Medical History:  Diagnosis Date   Arthritis    Cancer (HCC)    Cataract    OU   Colon cancer (HCC)    Family history of pancreatic cancer 08/21/2020   Family history of uterine cancer 08/21/2020   H/O cesarean section    Hx of tonsillectomy    Hyperlipidemia    Hypertension    Kidney disease    Macular degeneration    Exu ARMD OU   Paroxysmal atrial fibrillation (HCC) 07/05/2020   Stroke (cerebrum)  (HCC) 07/05/2020   Urinary retention 04/15/2020    Surgical History: Past Surgical History:  Procedure Laterality Date   BIOPSY  04/06/2020   Procedure: BIOPSY;  Surgeon: Malissa Hippo, MD;  Location: [facility];  Service: Endoscopy;;   CARPAL TUNNEL RELEASE Right    CATARACT EXTRACTION W/PHACO Left 10/11/2019   Procedure: CATARACT EXTRACTION PHACO AND INTRAOCULAR LENS PLACEMENT (IOC);  Surgeon: Fabio Pierce, MD;  Location: [facility];  Service: Ophthalmology;  Laterality: Left;  CDE: 8.56   CATARACT EXTRACTION W/PHACO Right 10/25/2019   Procedure: CATARACT EXTRACTION PHACO AND INTRAOCULAR LENS PLACEMENT (IOC);  Surgeon: Fabio Pierce, MD;  Location: [facility];  Service: Ophthalmology;  Laterality: Right;  CDE: 5.67   CESAREAN SECTION     COLONOSCOPY N/A 04/06/2020  Procedure: COLONOSCOPY;  Surgeon: Malissa Hippo, MD;  Location: [facility];  Service: Endoscopy;  Laterality: N/A;   CYSTOSCOPY WITH BIOPSY N/A 04/08/2020   Procedure: CYSTOSCOPY WITH BIOPSY;  Surgeon: Malen Gauze, MD;  Location: [facility];  Service: Urology;  Laterality: N/A;   ESOPHAGOGASTRODUODENOSCOPY N/A 04/05/2020   Procedure: ESOPHAGOGASTRODUODENOSCOPY (EGD);  Surgeon: Malissa Hippo, MD;  Location: [facility];  Service: Endoscopy;  Laterality: N/A;   PARTIAL COLECTOMY N/A 04/08/2020   Procedure: PARTIAL COLECTOMY;  Surgeon: Lucretia Roers, MD;  Location: [facility];  Service: General;  Laterality: N/A;   PORTACATH PLACEMENT Left 05/18/2020   Procedure: INSERTION PORT-A-CATH (ATTACHED CATHETER IN LEFT SUBCLAVIAN);  Surgeon: Lucretia Roers, MD;  Location: [facility];  Service: General;  Laterality: Left;   TONSILLECTOMY      Social History: Social History   Socioeconomic History   Marital status: Divorced    Spouse name: Not on file   Number of children: Not on file   Years of education: Not on file   Highest education level: Not on file  Occupational History   Not on file  Tobacco Use   Smoking  status: Never   Smokeless tobacco: Never  Vaping Use   Vaping status: Never Used  Substance and Sexual Activity   Alcohol use: No   Drug use: No   Sexual activity: Not Currently  Other Topics Concern   Not on file  Social History Narrative   Not on file   Social Drivers of Health   Financial Resource Strain: Low Risk  (03/15/2023)   Overall Financial Resource Strain (CARDIA)    Difficulty of Paying Living Expenses: Not hard at all  Food Insecurity: No Food Insecurity (08/01/2023)   Hunger Vital Sign    Worried About Running Out of Food in the Last Year: Never true    Ran Out of Food in the Last Year: Never true  Transportation Needs: No Transportation Needs (08/01/2023)   PRAPARE - Administrator, Civil Service (Medical): No    Lack of Transportation (Non-Medical): No  Physical Activity: Sufficiently Active (03/15/2023)   Exercise Vital Sign    Days of Exercise per Week: 5 days    Minutes of Exercise per Session: 30 min  Stress: No Stress Concern Present (03/15/2023)   [employer] of Occupational Health - Occupational Stress Questionnaire    Feeling of Stress : Not at all  Social Connections: Moderately Integrated (03/15/2023)   Social Connection and Isolation Panel [NHANES]    Frequency of Communication with Friends and Family: More than three times a week    Frequency of Social Gatherings with Friends and Family: More than three times a week    Attends Religious Services: More than 4 times per year    Active Member of [employer] or Organizations: Yes    Attends Banker Meetings: More than 4 times per year    Marital Status: Widowed  Intimate Partner Violence: Not At Risk (07/28/2023)   Humiliation, Afraid, Rape, and Kick questionnaire    Fear of Current or Ex-Partner: No    Emotionally Abused: No    Physically Abused: No    Sexually Abused: No    Family History: Family History  Problem Relation Age of Onset   Macular degeneration Mother     Stroke Mother    Hypertension Mother    Dementia Mother    Lung cancer Father        dx late 40s;  smoking hx   Diabetes Sister    Hypertension Sister    Leukemia Paternal Uncle        d. 41s   Cancer Maternal Aunt        ovarian or endometrial dx 26s   Pancreatic cancer Paternal Uncle        d. late 34s    Current Medications:  Current Outpatient Medications:    alendronate (FOSAMAX) 70 MG tablet, TAKE ONE TABLET EVERY 7 DAYS ON AN EMPTY STOMACH WITH A FULL GLASS OF WATER, Disp: 12 tablet, Rfl: 3   amLODipine (NORVASC) 10 MG tablet, Take 1 tablet (10 mg total) by mouth daily. For blood pressure, Disp: 90 tablet, Rfl: 3   atorvastatin (LIPITOR) 20 MG tablet, Take 1 tablet (20 mg total) by mouth daily. For cholesterol, Disp: 90 tablet, Rfl: 3   cholecalciferol (VITAMIN D3) 25 MCG (1000 UT) tablet, Take 1,000 Units by mouth daily., Disp: , Rfl:    CRANBERRY FRUIT PO, Take 1 tablet by mouth daily., Disp: , Rfl:    diclofenac Sodium (VOLTAREN) 1 % GEL, APPLY 4 GRAMS TO AFFECTED AREA(S) 4 TIMES A DAY (Patient taking differently: Apply 4 g topically 4 (four) times daily as needed (Pain).), Disp: 400 g, Rfl: 2   dorzolamide-timolol (COSOPT) 2-0.5 % ophthalmic solution, INSTILL ONE DROP IN EACH EYE TWICE DAILY, Disp: 10 mL, Rfl: 10   ELIQUIS 5 MG TABS tablet, Take 1 tablet (5 mg total) by mouth 2 (two) times daily., Disp: 180 tablet, Rfl: 3   Ferrous Sulfate (IRON) 325 (65 Fe) MG TABS, Take 1 tablet (325 mg total) by mouth daily., Disp: 90 tablet, Rfl: 3   fluticasone (FLONASE) 50 MCG/ACT nasal spray, PLACE 2 SPRAYS INTO EACH NOSTRIL ONCE A DAY, Disp: 16 g, Rfl: 6   guaiFENesin-codeine 100-10 MG/5ML syrup, Take 5 mLs by mouth 3 (three) times daily as needed for cough., Disp: 120 mL, Rfl: 0   hydrALAZINE (APRESOLINE) 25 MG tablet, Take 1 tablet (25 mg total) by mouth 2 (two) times daily., Disp: 180 tablet, Rfl: 3   Multiple Vitamins-Minerals (CENTRUM ADULT PO), Take 1 tablet by mouth daily.,  Disp: , Rfl:    Potassium Chloride ER 20 MEQ TBCR, Take 1 tablet (20 mEq total) by mouth daily. Take while taking hydrochlorothiazide/HCTZ, Disp: 30 tablet, Rfl: 1 No current facility-administered medications for this visit.  Facility-Administered Medications Ordered in Other Visits:    0.9 %  sodium chloride infusion, , Intravenous, Once, Doreatha Massed, MD   heparin lock flush 100 unit/mL, 500 Units, Intracatheter, Once PRN, Doreatha Massed, MD   pembrolizumab [facility]) 200 mg in sodium chloride 0.9 % 50 mL chemo infusion, 200 mg, Intravenous, Once, Doreatha Massed, MD   sodium chloride flush (NS) 0.9 % injection 10 mL, 10 mL, Intracatheter, PRN, Doreatha Massed, MD   Allergies: Allergies  Allergen Reactions   Lisinopril Cough    REVIEW OF SYSTEMS:   Review of Systems  Constitutional:  Negative for chills, fatigue and fever.  HENT:   Negative for lump/mass, mouth sores, nosebleeds, sore throat and trouble swallowing.   Eyes:  Negative for eye problems.  Respiratory:  Negative for cough and shortness of breath.   Cardiovascular:  Negative for chest pain, leg swelling and palpitations.  Gastrointestinal:  Negative for abdominal pain, constipation, diarrhea, nausea and vomiting.  Genitourinary:  Negative for bladder incontinence, difficulty urinating, dysuria, frequency, hematuria and nocturia.   Musculoskeletal:  Negative for arthralgias, back pain, flank pain, myalgias and neck pain.  Skin:  Negative for itching and rash.  Neurological:  Negative for dizziness, headaches and numbness.  Hematological:  Does not bruise/bleed easily.  Psychiatric/Behavioral:  Negative for depression, sleep disturbance and suicidal ideas. The patient is not nervous/anxious.   All other systems reviewed and are negative.    VITALS:   Blood pressure (!) 152/75, pulse 73, temperature 97.7 F (36.5 C), temperature source Oral, resp. rate 18, weight 168 lb 3.4 oz (76.3 kg), SpO2  97%.  Wt Readings from Last 3 Encounters:  11/30/23 168 lb 3.4 oz (76.3 kg)  11/09/23 171 lb 12.8 oz (77.9 kg)  10/19/23 170 lb 3.2 oz (77.2 kg)    Body mass index is 29.8 kg/m.  Performance status (ECOG): 1 - Symptomatic but completely ambulatory  PHYSICAL EXAM:   Physical Exam Vitals and nursing note reviewed. Exam conducted with a chaperone present.  Constitutional:      Appearance: Normal appearance.  Cardiovascular:     Rate and Rhythm: Normal rate and regular rhythm.     Pulses: Normal pulses.     Heart sounds: Normal heart sounds.  Pulmonary:     Effort: Pulmonary effort is normal.     Breath sounds: Normal breath sounds.  Abdominal:     Palpations: Abdomen is soft. There is no hepatomegaly, splenomegaly or mass.     Tenderness: There is no abdominal tenderness.  Musculoskeletal:     Right lower leg: No edema.     Left lower leg: No edema.  Lymphadenopathy:     Cervical: No cervical adenopathy.     Right cervical: No superficial, deep or posterior cervical adenopathy.    Left cervical: No superficial, deep or posterior cervical adenopathy.     Upper Body:     Right upper body: No supraclavicular or axillary adenopathy.     Left upper body: No supraclavicular or axillary adenopathy.  Neurological:     General: No focal deficit present.     Mental Status: She is alert and oriented to person, place, and time.  Psychiatric:        Mood and Affect: Mood normal.        Behavior: Behavior normal.     LABS:      Latest Ref Rng & Units 11/30/2023   12:01 PM 11/09/2023    1:30 PM 10/19/2023   11:54 AM  CBC  WBC 4.0 - 10.5 K/uL 6.1  7.8  6.8   Hemoglobin 12.0 - 15.0 g/dL 40.9  81.1  91.4   Hematocrit 36.0 - 46.0 % 38.9  35.7  37.2   Platelets 150 - 400 K/uL 158  162  156       Latest Ref Rng & Units 11/30/2023   12:01 PM 11/09/2023    1:30 PM 10/19/2023   11:54 AM  CMP  Glucose 70 - 99 mg/dL 782  956  94   BUN 8 - 23 mg/dL 16  16  18    Creatinine 0.44 -  1.00 mg/dL 2.13  0.86  5.78   Sodium 135 - 145 mmol/L 137  139  138   Potassium 3.5 - 5.1 mmol/L 3.5  3.7  3.8   Chloride 98 - 111 mmol/L 101  103  100   CO2 22 - 32 mmol/L 28  27  27    Calcium 8.9 - 10.3 mg/dL 9.8  9.8  9.6   Total Protein 6.5 - 8.1 g/dL 7.7  7.5  7.6   Total Bilirubin <1.2 mg/dL 0.5  0.3  0.5   Alkaline Phos 38 - 126 U/L 73  82  77   AST 15 - 41 U/L 17  17  17    ALT 0 - 44 U/L 13  13  12       Lab Results  Component Value Date   CEA1 1.9 11/09/2023   /  CEA  Date Value Ref Range Status  11/09/2023 1.9 0.0 - 4.7 ng/mL Final    Comment:    (NOTE)                             Nonsmokers          <3.9                             Smokers             <5.6 [employer] Electrochemiluminescence Immunoassay (ECLIA) Values obtained with different assay methods or kits cannot be used interchangeably.  Results cannot be interpreted as absolute evidence of the presence or absence of malignant disease. Performed At: [facility] [address] [place], [place] [id number] Jolene Schimke MD WU:[id number]    No results found for: "PSA1" No results found for: "CAN199" No results found for: "CAN125"  No results found for: "TOTALPROTELP", "ALBUMINELP", "A1GS", "A2GS", "BETS", "BETA2SER", "GAMS", "MSPIKE", "SPEI" Lab Results  Component Value Date   TIBC 371 03/01/2023   TIBC 396 05/13/2020   TIBC 395 05/13/2020   FERRITIN 52 03/01/2023   FERRITIN 65 05/13/2020   FERRITIN 64 05/13/2020   IRONPCTSAT 18 03/01/2023   IRONPCTSAT 23 05/13/2020   IRONPCTSAT 23 05/13/2020   No results found for: "LDH"   STUDIES:   Intravitreal Injection, Pharmacologic Agent - OS - Left Eye Result Date: 11/22/2023 Time Out 11/22/2023. 8:13 AM. Confirmed correct patient, procedure, site, and patient consented. Anesthesia Topical anesthesia was used. Anesthetic medications included Lidocaine 2%, Proparacaine 0.5%. Procedure Preparation included 5% betadine to ocular surface,  eyelid speculum. A supplied (32g) needle was used. Injection: 6 mg faricimab-svoa 6 MG/0.05ML   Route: Intravitreal, Site: Left Eye   NDC: 95621-308-65, Lot: [id number], Expiration date: 04/03/2025, Waste: 0 mL Post-op Post injection exam found visual acuity of at least counting fingers. The patient tolerated the procedure well. There were no complications. The patient received written and verbal post procedure care education. Post injection medications were not given.   OCT, Retina - OU - Both Eyes Result Date: 11/22/2023 Right Eye Quality was good. Central Foveal Thickness: 494. Progression has been stable. Findings include no SRF, abnormal foveal contour, retinal drusen , outer retinal tubulation, subretinal hyper-reflective material, disciform scar, epiretinal membrane, intraretinal fluid, pigment epithelial detachment, outer retinal atrophy (Persistent cystic changes overlying stable central SRHM / disciform scar). Left Eye Quality was good. Central Foveal Thickness: 281. Progression has been stable. Findings include no SRF, abnormal foveal contour, retinal drusen , subretinal hyper-reflective material, intraretinal fluid, pigment epithelial detachment, outer retinal atrophy (Trace persistent cystic changes nasal fovea and macula overlying PED/SRHM ). Notes *Images captured and stored on drive Diagnosis / Impression: Exudative ARMD OU OD: persistent cystic changes overlying stable central SRHM / disciform scar OS: trace persistent cystic changes nasal fovea and macula overlying PED/SRHM Clinical management: See below Abbreviations: NFP - Normal foveal profile. CME - cystoid macular edema. PED - pigment epithelial detachment. IRF - intraretinal fluid. SRF - subretinal fluid. EZ - ellipsoid zone. ERM - epiretinal  membrane. ORA - outer retinal atrophy. ORT - outer retinal tubulation. SRHM - subretinal hyper-reflective material

## 2023-11-30 NOTE — Patient Instructions (Signed)

## 2023-11-30 NOTE — Patient Instructions (Signed)
 [facility] [place] - A DEPT OF [facility]  Discharge Instructions: Thank you for choosing [facility] to provide your oncology and hematology care.  If you have a lab appointment with the Cancer Center - please note that after April 8th, 2024, all labs will be drawn in the cancer center.  You do not have to check in or register with the main entrance as you have in the past but will complete your check-in in the cancer center.  Wear comfortable clothing and clothing appropriate for easy access to any Portacath or PICC line.   We strive to give you quality time with your provider. You may need to reschedule your appointment if you arrive late (15 or more minutes).  Arriving late affects you and other patients whose appointments are after yours.  Also, if you miss three or more appointments without notifying the office, you may be dismissed from the clinic at the provider's discretion.      For prescription refill requests, have your pharmacy contact our office and allow 72 hours for refills to be completed.    Today you received the following chemotherapy and/or immunotherapy agents Keytruda   To help prevent nausea and vomiting after your treatment, we encourage you to take your nausea medication as directed.  BELOW ARE SYMPTOMS THAT SHOULD BE REPORTED IMMEDIATELY: *FEVER GREATER THAN 100.4 F (38 C) OR HIGHER *CHILLS OR SWEATING *NAUSEA AND VOMITING THAT IS NOT CONTROLLED WITH YOUR NAUSEA MEDICATION *UNUSUAL SHORTNESS OF BREATH *UNUSUAL BRUISING OR BLEEDING *URINARY PROBLEMS (pain or burning when urinating, or frequent urination) *BOWEL PROBLEMS (unusual diarrhea, constipation, pain near the anus) TENDERNESS IN MOUTH AND THROAT WITH OR WITHOUT PRESENCE OF ULCERS (sore throat, sores in mouth, or a toothache) UNUSUAL RASH, SWELLING OR PAIN  UNUSUAL VAGINAL DISCHARGE OR ITCHING   Items with * indicate a potential emergency and should be followed up as  soon as possible or go to the Emergency Department if any problems should occur.  Please show the CHEMOTHERAPY ALERT CARD or IMMUNOTHERAPY ALERT CARD at check-in to the Emergency Department and triage nurse.  Should you have questions after your visit or need to cancel or reschedule your appointment, please contact [facility] [place] - A DEPT OF Eligha Bridegroom [facility] [phone]  and follow the prompts.  Office hours are 8:00 a.m. to 4:30 p.m. Monday - Friday. Please note that voicemails left after 4:00 p.m. may not be returned until the following business day.  We are closed weekends and major holidays. You have access to a nurse at all times for urgent questions. Please call the main number to the clinic [phone] and follow the prompts.  For any non-urgent questions, you may also contact your provider using MyChart. We now offer e-Visits for anyone 5 and older to request care online for non-urgent symptoms. For details visit [email or web address].   Also download the MyChart app! Go to the app store, search "MyChart", open the app, select , and log in with your MyChart username and password.

## 2023-12-04 ENCOUNTER — Ambulatory Visit: Payer: Medicare HMO | Admitting: Urology

## 2023-12-04 DIAGNOSIS — N3289 Other specified disorders of bladder: Secondary | ICD-10-CM

## 2023-12-04 DIAGNOSIS — R3129 Other microscopic hematuria: Secondary | ICD-10-CM

## 2023-12-18 ENCOUNTER — Ambulatory Visit (HOSPITAL_COMMUNITY): Admission: RE | Admit: 2023-12-18 | Payer: Medicare HMO | Source: Ambulatory Visit

## 2023-12-21 ENCOUNTER — Ambulatory Visit (HOSPITAL_COMMUNITY)
Admission: RE | Admit: 2023-12-21 | Discharge: 2023-12-21 | Disposition: A | Discharge status: Home / Self Care | Payer: Medicare HMO | Source: Ambulatory Visit | Attending: Hematology | Admitting: Hematology

## 2023-12-21 DIAGNOSIS — C189 Malignant neoplasm of colon, unspecified: Secondary | ICD-10-CM | POA: Insufficient documentation

## 2023-12-21 DIAGNOSIS — I700 Atherosclerosis of aorta: Secondary | ICD-10-CM | POA: Diagnosis not present

## 2023-12-21 MED ORDER — IOHEXOL 300 MG/ML  SOLN
100.0000 mL | Freq: Once | INTRAMUSCULAR | Status: AC | PRN
  Administered 2023-12-21: 100 mL via INTRAVENOUS

## 2023-12-25 ENCOUNTER — Inpatient Hospital Stay: Payer: Medicare HMO | Admitting: Hematology

## 2023-12-25 ENCOUNTER — Inpatient Hospital Stay: Payer: Medicare HMO

## 2023-12-25 ENCOUNTER — Inpatient Hospital Stay: Payer: Medicare HMO | Attending: Hematology

## 2023-12-25 VITALS — BP 155/68 | HR 81 | Temp 97.5°F | Resp 18 | Wt 172.0 lb

## 2023-12-25 DIAGNOSIS — I480 Paroxysmal atrial fibrillation: Secondary | ICD-10-CM | POA: Insufficient documentation

## 2023-12-25 DIAGNOSIS — C189 Malignant neoplasm of colon, unspecified: Secondary | ICD-10-CM

## 2023-12-25 DIAGNOSIS — Z79620 Long term (current) use of immunosuppressive biologic: Secondary | ICD-10-CM | POA: Diagnosis not present

## 2023-12-25 DIAGNOSIS — Z801 Family history of malignant neoplasm of trachea, bronchus and lung: Secondary | ICD-10-CM | POA: Insufficient documentation

## 2023-12-25 DIAGNOSIS — Z79899 Other long term (current) drug therapy: Secondary | ICD-10-CM | POA: Insufficient documentation

## 2023-12-25 DIAGNOSIS — I10 Essential (primary) hypertension: Secondary | ICD-10-CM | POA: Insufficient documentation

## 2023-12-25 DIAGNOSIS — C182 Malignant neoplasm of ascending colon: Secondary | ICD-10-CM | POA: Diagnosis not present

## 2023-12-25 DIAGNOSIS — E876 Hypokalemia: Secondary | ICD-10-CM | POA: Insufficient documentation

## 2023-12-25 DIAGNOSIS — Z7901 Long term (current) use of anticoagulants: Secondary | ICD-10-CM | POA: Diagnosis not present

## 2023-12-25 DIAGNOSIS — Z800 Family history of malignant neoplasm of digestive organs: Secondary | ICD-10-CM | POA: Diagnosis not present

## 2023-12-25 LAB — COMPREHENSIVE METABOLIC PANEL
ALT: 14 U/L (ref 0–44)
AST: 20 U/L (ref 15–41)
Albumin: 4.2 g/dL (ref 3.5–5.0)
Alkaline Phosphatase: 80 U/L (ref 38–126)
Anion gap: 11 (ref 5–15)
BUN: 15 mg/dL (ref 8–23)
CO2: 28 mmol/L (ref 22–32)
Calcium: 9.6 mg/dL (ref 8.9–10.3)
Chloride: 100 mmol/L (ref 98–111)
Creatinine, Ser: 1.15 mg/dL — ABNORMAL HIGH (ref 0.44–1.00)
GFR, Estimated: 49 mL/min — ABNORMAL LOW (ref >60)
Glucose, Bld: 108 mg/dL — ABNORMAL HIGH (ref 70–99)
Potassium: 3.4 mmol/L — ABNORMAL LOW (ref 3.5–5.1)
Sodium: 139 mmol/L (ref 135–145)
Total Bilirubin: 0.5 mg/dL (ref 0.0–1.2)
Total Protein: 7.9 g/dL (ref 6.5–8.1)

## 2023-12-25 LAB — CBC WITH DIFFERENTIAL/PLATELET
Abs Immature Granulocytes: 0.03 10*3/uL (ref 0.00–0.07)
Basophils Absolute: 0 10*3/uL (ref 0.0–0.1)
Basophils Relative: 0 %
Eosinophils Absolute: 0.3 10*3/uL (ref 0.0–0.5)
Eosinophils Relative: 5 %
HCT: 37.5 % (ref 36.0–46.0)
Hemoglobin: 12.3 g/dL (ref 12.0–15.0)
Immature Granulocytes: 1 %
Lymphocytes Relative: 21 %
Lymphs Abs: 1.3 10*3/uL (ref 0.7–4.0)
MCH: 30.1 pg (ref 26.0–34.0)
MCHC: 32.8 g/dL (ref 30.0–36.0)
MCV: 91.9 fL (ref 80.0–100.0)
Monocytes Absolute: 0.5 10*3/uL (ref 0.1–1.0)
Monocytes Relative: 8 %
Neutro Abs: 4.2 10*3/uL (ref 1.7–7.7)
Neutrophils Relative %: 65 %
Platelets: 148 10*3/uL — ABNORMAL LOW (ref 150–400)
RBC: 4.08 MIL/uL (ref 3.87–5.11)
RDW: 14.3 % (ref 11.5–15.5)
WBC: 6.3 10*3/uL (ref 4.0–10.5)
nRBC: 0 % (ref 0.0–0.2)

## 2023-12-25 LAB — TSH: TSH: 2.217 u[IU]/mL (ref 0.350–4.500)

## 2023-12-25 LAB — MAGNESIUM: Magnesium: 2.3 mg/dL (ref 1.7–2.4)

## 2023-12-25 NOTE — Progress Notes (Signed)
Discontinuing Keytruda per Dr. Elbert Ewings.  Patient left ambulatory in stable condition.

## 2023-12-25 NOTE — Patient Instructions (Signed)
[facility] at [facility] Discharge Instructions   You were seen and examined today by Dr. [name].  He reviewed the results of your lab work which are normal/stable.   We will discontinue your treatment at this time. Your scan was good, so Dr. Kirtland Bouchard feels like it is okay to just monitor you with scans every few months.   Return as scheduled.    Thank you for choosing [facility] at [facility] to provide your oncology and hematology care.  To afford each patient quality time with our provider, please arrive at least 15 minutes before your scheduled appointment time.   If you have a lab appointment with the Cancer Center please come in thru the Main Entrance and check in at the main information desk.  You need to re-schedule your appointment should you arrive 10 or more minutes late.  We strive to give you quality time with our providers, and arriving late affects you and other patients whose appointments are after yours.  Also, if you no show three or more times for appointments you may be dismissed from the clinic at the providers discretion.     Again, thank you for choosing [facility].  Our hope is that these requests will decrease the amount of time that you wait before being seen by our physicians.       _____________________________________________________________  Should you have questions after your visit to [facility], please contact our office at [phone] and follow the prompts.  Our office hours are 8:00 a.m. and 4:30 p.m. Monday - Friday.  Please note that voicemails left after 4:00 p.m. may not be returned until the following business day.  We are closed weekends and major holidays.  You do have access to a nurse 24-7, just call the main number to the clinic [phone] and do not press any options, hold on the line and a nurse will answer the phone.    For prescription refill requests, have your  pharmacy contact our office and allow 72 hours.    Due to Covid, you will need to wear a mask upon entering the hospital. If you do not have a mask, a mask will be given to you at the Main Entrance upon arrival. For doctor visits, patients may have 1 support person age 12 or older with them. For treatment visits, patients can not have anyone with them due to social distancing guidelines and our immunocompromised population.

## 2023-12-25 NOTE — Progress Notes (Signed)
[facility] [address] [place] [address]    Clinic Day:  12/25/23    Referring physician: [name], DO  Patient Care Team: [name], DO as PCP - General (Family Medicine) [name], MD as Consulting Physician (Retina Ophthalmology) Fabio Pierce, MD as Consulting Physician (Ophthalmology) West Bali, MD (Inactive) as Consulting Physician (Gastroenterology) Doreatha Massed, MD as Medical Oncologist (Oncology)   ASSESSMENT & PLAN:   Assessment: 1.  Stage IIIb (T4AN1A) poorly differentiated right colon adenocarcinoma: -Right hemicolectomy on 04/07/2020 with poorly differentiated adenocarcinoma, pT4a, positive radial margin, 1/15 lymph nodes positive, loss of MLH1 and PMS2, bladder biopsy negative for malignancy. -CTAP on 04/06/2020 showed circumferential ascending colon mass with numerous borderline enlarged pericolonic lymph nodes.  No findings of hepatic metastatic disease. -CEA on 04/04/2020 was 12.2.  CEA improved to 1.7 on 05/13/2020. -PET scan on 05/11/2020 shows mild FDG uptake associated with the anastomotic site.  Small pulmonary nodules that do not show elevated FDG activity, remain nonspecific, subcentimeter.  Persistent but improved bladder wall thickening. -Cycle 1 of dose reduced FOLFOX on 05/19/2020, followed by 2 hospitalizations, 1 from acute kidney injury from diarrhea and second admission for C. difficile colitis. -Cycle 2 of 5-FU and leucovorin dose reduced on 06/30/2020.  Chemotherapy discontinued secondary to intolerance. -Follow-up CT scan on 11/02/2020 with multiple soft tissue lesions in the central and right mesentery measuring up to 3.1 x 1.7 cm.  Mild lymphadenopathy in the hepatic duodenal ligament, retroperitoneal space, right common iliac chain, bilateral external iliac chains.  17 mm soft tissue nodule in the lower anterior abdominal wall.  Bilateral tiny pulmonary nodules not substantially changed.  2.9 x 2.5  cm low-density lesion along the posterior uterus is indeterminate. -Pembrolizumab started on 11/10/2020. -CT CAP from 02/09/2021 showed interval near complete resolution of previously demonstrated nodal mass in the ileocolonic mesentery.  Residual ill-defined nodule medial to the tip of the right hepatic lobe.  Stable small pulmonary nodules bilaterally consistent with benign findings.   2.  Family history: -Paternal uncle had colon cancer.  Maternal aunt had brain cancer and another maternal aunt had gynecological malignancy.  Father had lung cancer and was a smoker. - Genetic testing did not reveal any notable mutations.   3.  Diffuse erythema and nodularity of the dome of the bladder: -CT scan showed severely thickened bladder wall. -Cystoscopy on 04/08/2020 showed diffuse erythema, nodularity in the posterior wall tracking to the dome.  Ureteral orifices were in normal locations.  Biopsies were benign.    Plan: 1.  Stage IV right colon adenocarcinoma, MSI-high: - She is tolerating Keytruda without any immunotherapy related side effects. - Reviewed labs from 12/25/2023: Normal LFTs and mildly elevated creatinine stable.  CBC was normal.  TSH is 2.2. - Reviewed CT CAP from 12/21/2023: Multiple small bilateral lung nodules largest measuring 0.7 cm stable.  No new nodules.  No evidence of lymphadenopathy or metastatic disease in the abdomen or pelvis. - We had a prolonged discussion about stopping Keytruda at this time and monitoring closely.  We have discussed possibility that the cancer may come back.  She is agreeable for close monitoring.  We will set her up for 28-month follow-up with repeat CT CAP with contrast and CEA level.   2.  Paroxysmal atrial fibrillation: - Continue Eliquis indefinitely.  No bleeding issues.   3.  Hypertension: - Continue amlodipine and Hyzaar.  Blood pressure today is 155/68.  4.  Hypokalemia: - Continue  potassium 20 mill equivalents daily.  Potassium is 3.4.     Orders Placed This Encounter  Procedures   CT CHEST ABDOMEN PELVIS W CONTRAST    Standing Status:   Future    Expected Date:   03/24/2024    Expiration Date:   12/24/2024    If indicated for the ordered procedure, I authorize the administration of contrast media per Radiology protocol:   Yes    Does the patient have a contrast media/X-ray dye allergy?:   No    Preferred imaging location?:   [facility]    Release to patient:   Immediate    If indicated for the ordered procedure, I authorize the administration of oral contrast media per Radiology protocol:   Yes       I,Helena R Teague,acting as a scribe for Doreatha Massed, MD.,have documented all relevant documentation on the behalf of Doreatha Massed, MD,as directed by  Doreatha Massed, MD while in the presence of Doreatha Massed, MD.  I, Doreatha Massed MD, have reviewed the above documentation for accuracy and completeness, and I agree with the above.        Doreatha Massed, MD   1/20/20253:59 PM  CHIEF COMPLAINT:   Diagnosis: right colon cancer    Cancer Staging  Malignant neoplasm of colon [facility]) Staging form: Colon and Rectum, AJCC 8th Edition - Clinical stage from 04/27/2020: Stage IIIB (cT4a, cN1a, cM0) - Unsigned - Pathologic stage from 11/05/2020: Stage IVC (rpTX, pN0, pM1c) - Signed by Doreatha Massed, MD on 11/05/2020    Prior Therapy: Rande Lawman every 3 weeks   Current Therapy:  COLORECTAL Pembrolizumab    HISTORY OF PRESENT ILLNESS:   Oncology History  Malignant neoplasm of colon (HCC)  04/07/2020 Initial Diagnosis   Malignant neoplasm of ascending colon (HCC)   05/13/2020 Genetic Testing   [employer] [facility]     05/19/2020 - 07/02/2020 Chemotherapy   The patient had palonosetron (ALOXI) injection 0.25 mg, 0.25 mg, Intravenous,  Once, 2 of 12 cycles Administration: 0.25 mg (05/19/2020), 0.25 mg (06/30/2020) leucovorin 522 mg in dextrose 5 % 250 mL infusion, 320 mg/m2 =  522 mg (80 % of original dose 400 mg/m2), Intravenous,  Once, 2 of 12 cycles Dose modification: 320 mg/m2 (80 % of original dose 400 mg/m2, Cycle 1, Reason: Provider Judgment), 010.9323 mg/m2 (66.7 % of original dose 400 mg/m2, Cycle 2, Reason: Provider Judgment) Administration: 522 mg (05/19/2020), 434 mg (06/30/2020) oxaliplatin (ELOXATIN) 110 mg in dextrose 5 % 500 mL chemo infusion, 68 mg/m2 = 110 mg (80 % of original dose 85 mg/m2), Intravenous,  Once, 1 of 1 cycle Dose modification: 68 mg/m2 (80 % of original dose 85 mg/m2, Cycle 1, Reason: Provider Judgment) Administration: 110 mg (05/19/2020) fluorouracil (ADRUCIL) chemo injection 500 mg, 320 mg/m2 = 500 mg (80 % of original dose 400 mg/m2), Intravenous,  Once, 2 of 12 cycles Dose modification: 320 mg/m2 (80 % of original dose 400 mg/m2, Cycle 1, Reason: Provider Judgment), 557.3220 mg/m2 (66.7 % of original dose 400 mg/m2, Cycle 2, Reason: Provider Judgment) Administration: 500 mg (05/19/2020), 450 mg (06/30/2020) fluorouracil (ADRUCIL) 3,150 mg in sodium chloride 0.9 % 87 mL chemo infusion, 1,920 mg/m2 = 3,150 mg (80 % of original dose 2,400 mg/m2), Intravenous, 1 Day/Dose, 2 of 12 cycles Dose modification: 1,920 mg/m2 (80 % of original dose 2,400 mg/m2, Cycle 1, Reason: Provider Judgment), 1,600 mg/m2 (66.7 % of original dose 2,400 mg/m2, Cycle 2, Reason: Provider Judgment) Administration: 3,150 mg (05/19/2020), 2,600 mg (  06/30/2020)  for chemotherapy treatment.    08/03/2020 Genetic Testing   [employer] Reveal Testing     08/14/2020 Genetic Testing   No pathogenic variants detected in [facility] Common Hereditary Cancers Panel.  Variant of uncertain significance (VUS) detected in HOXB13 at c.634G>A (p.Ala212Thr). The Common Hereditary Cancers Panel offered by [facility] includes sequencing and/or deletion duplication testing of the following 48 genes: APC, ATM, AXIN2, BARD1, BMPR1A, BRCA1, BRCA2, BRIP1, CDH1, CDK4, CDKN2A (p14ARF), CDKN2A (p16INK4a),  CHEK2, CTNNA1, DICER1, EPCAM (Deletion/duplication testing only), FLCN, GREM1 (promoter region deletion/duplication testing only), KIT, MEN1, MLH1, MSH2, MSH3, MSH6, MUTYH, NBN, NF1, NHTL1, PALB2, PDGFRA, PMS2, POLD1, POLE, PTEN, RAD50, RAD51C, RAD51D, RNF43, SDHB, SDHC, SDHD, SMAD4, SMARCA4. STK11, TP53, TSC1, TSC2, and VHL.  The following genes were evaluated for sequence changes only: SDHA and HOXB13 c.251G>A variant only. The report date is August 14, 2020.    11/05/2020 Cancer Staging   Staging form: Colon and Rectum, AJCC 8th Edition - Pathologic stage from 11/05/2020: Stage IVC (rpTX, pN0, pM1c) - Signed by Doreatha Massed, MD on 11/05/2020   11/10/2020 - 07/13/2022 Chemotherapy   Patient is on Treatment Plan : COLORECTAL Pembrolizumab q21d     11/10/2020 -  Chemotherapy   Patient is on Treatment Plan : COLORECTAL Pembrolizumab (200) q21d        INTERVAL HISTORY:   Gabrielle [name] is a 78 y.o. female presenting to clinic today for follow up of right colon cancer. She was last seen by me on 11/30/23.  Since her last visit, she underwent CT C/A/P on 12/21/23 that found: multiple unchanged small bilateral pulmonary nodules, largest in the central right lower lobe measuring 0.7 cm; no new nodules; no evidence of lymphadenopathy or metastatic disease in the abdomen or pelvis; and unchanged mild distention of the urinary bladder with diffuse urinary bladder wall thickening.  Today, she states that she is doing well overall. Her appetite level is at 100%. Her energy level is at 90%.   PAST MEDICAL HISTORY:   Past Medical History: Past Medical History:  Diagnosis Date   Arthritis    Cancer (HCC)    Cataract    OU   Colon cancer (HCC)    Family history of pancreatic cancer 08/21/2020   Family history of uterine cancer 08/21/2020   H/O cesarean section    Hx of tonsillectomy    Hyperlipidemia    Hypertension    Kidney disease    Macular degeneration    Exu ARMD OU   Paroxysmal atrial  fibrillation (HCC) 07/05/2020   Stroke (cerebrum) (HCC) 07/05/2020   Urinary retention 04/15/2020    Surgical History: Past Surgical History:  Procedure Laterality Date   BIOPSY  04/06/2020   Procedure: BIOPSY;  Surgeon: Malissa Hippo, MD;  [facility];  Service: Endoscopy;;   CARPAL TUNNEL RELEASE Right    CATARACT EXTRACTION W/PHACO Left 10/11/2019   Procedure: CATARACT EXTRACTION PHACO AND INTRAOCULAR LENS PLACEMENT (IOC);  Surgeon: Fabio Pierce, MD;  Location: [facility];  Service: Ophthalmology;  Laterality: Left;  CDE: 8.56   CATARACT EXTRACTION W/PHACO Right 10/25/2019   Procedure: CATARACT EXTRACTION PHACO AND INTRAOCULAR LENS PLACEMENT (IOC);  Surgeon: Fabio Pierce, MD;  Location: [facility];  Service: Ophthalmology;  Laterality: Right;  CDE: 5.67   CESAREAN SECTION     COLONOSCOPY N/A 04/06/2020   Procedure: COLONOSCOPY;  Surgeon: Malissa Hippo, MD;  Location: [facility];  Service: Endoscopy;  Laterality: N/A;   CYSTOSCOPY WITH BIOPSY N/A 04/08/2020   Procedure:  CYSTOSCOPY WITH BIOPSY;  Surgeon: Malen Gauze, MD;  Location: [facility];  Service: Urology;  Laterality: N/A;   ESOPHAGOGASTRODUODENOSCOPY N/A 04/05/2020   Procedure: ESOPHAGOGASTRODUODENOSCOPY (EGD);  Surgeon: Malissa Hippo, MD;  [facility]: [facility];  Service: Endoscopy;  Laterality: N/A;   PARTIAL COLECTOMY N/A 04/08/2020   Procedure: PARTIAL COLECTOMY;  Surgeon: Lucretia Roers, MD;  Location: [facility];  Service: General;  Laterality: N/A;   PORTACATH PLACEMENT Left 05/18/2020   Procedure: INSERTION PORT-A-CATH (ATTACHED CATHETER IN LEFT SUBCLAVIAN);  Surgeon: Lucretia Roers, MD;  Location: [facility];  Service: General;  Laterality: Left;   TONSILLECTOMY      Social History: Social History   Socioeconomic History   Marital status: Divorced    Spouse name: Not on file   Number of children: Not on file   Years of education: Not on file   Highest education level: Not on file  Occupational  History   Not on file  Tobacco Use   Smoking status: Never   Smokeless tobacco: Never  Vaping Use   Vaping status: Never Used  Substance and Sexual Activity   Alcohol use: No   Drug use: No   Sexual activity: Not Currently  Other Topics Concern   Not on file  Social History Narrative   Not on file   Social Drivers of Health   Financial Resource Strain: Low Risk  (03/15/2023)   Overall Financial Resource Strain (CARDIA)    Difficulty of Paying Living Expenses: Not hard at all  Food Insecurity: No Food Insecurity (08/01/2023)   Hunger Vital Sign    Worried About Running Out of Food in the Last Year: Never true    Ran Out of Food in the Last Year: Never true  Transportation Needs: No Transportation Needs (08/01/2023)   PRAPARE - Administrator, Civil Service (Medical): No    Lack of Transportation (Non-Medical): No  Physical Activity: Sufficiently Active (03/15/2023)   Exercise Vital Sign    Days of Exercise per Week: 5 days    Minutes of Exercise per Session: 30 min  Stress: No Stress Concern Present (03/15/2023)   [employer] of Occupational Health - Occupational Stress Questionnaire    Feeling of Stress : Not at all  Social Connections: Moderately Integrated (03/15/2023)   Social Connection and Isolation Panel [NHANES]    Frequency of Communication with Friends and Family: More than three times a week    Frequency of Social Gatherings with Friends and Family: More than three times a week    Attends Religious Services: More than 4 times per year    Active Member of [employer] or Organizations: Yes    Attends Banker Meetings: More than 4 times per year    Marital Status: Widowed  Intimate Partner Violence: Not At Risk (07/28/2023)   Humiliation, Afraid, Rape, and Kick questionnaire    Fear of Current or Ex-Partner: No    Emotionally Abused: No    Physically Abused: No    Sexually Abused: No    Family History: Family History  Problem Relation Age  of Onset   Macular degeneration Mother    Stroke Mother    Hypertension Mother    Dementia Mother    Lung cancer Father        dx late 48s; smoking hx   Diabetes Sister    Hypertension Sister    Leukemia Paternal Uncle        d. 54s   Cancer  Maternal Aunt        ovarian or endometrial dx 16s   Pancreatic cancer Paternal Uncle        d. late 79s    Current Medications:  Current Outpatient Medications:    alendronate (FOSAMAX) 70 MG tablet, TAKE ONE TABLET EVERY 7 DAYS ON AN EMPTY STOMACH WITH A FULL GLASS OF WATER, Disp: 12 tablet, Rfl: 3   amLODipine (NORVASC) 10 MG tablet, Take 1 tablet (10 mg total) by mouth daily. For blood pressure, Disp: 90 tablet, Rfl: 3   atorvastatin (LIPITOR) 20 MG tablet, Take 1 tablet (20 mg total) by mouth daily. For cholesterol, Disp: 90 tablet, Rfl: 3   cholecalciferol (VITAMIN D3) 25 MCG (1000 UT) tablet, Take 1,000 Units by mouth daily., Disp: , Rfl:    CRANBERRY FRUIT PO, Take 1 tablet by mouth daily., Disp: , Rfl:    diclofenac Sodium (VOLTAREN) 1 % GEL, APPLY 4 GRAMS TO AFFECTED AREA(S) 4 TIMES A DAY (Patient taking differently: Apply 4 g topically 4 (four) times daily as needed (Pain).), Disp: 400 g, Rfl: 2   dorzolamide-timolol (COSOPT) 2-0.5 % ophthalmic solution, INSTILL ONE DROP IN EACH EYE TWICE DAILY, Disp: 10 mL, Rfl: 10   ELIQUIS 5 MG TABS tablet, Take 1 tablet (5 mg total) by mouth 2 (two) times daily., Disp: 180 tablet, Rfl: 3   Ferrous Sulfate (IRON) 325 (65 Fe) MG TABS, Take 1 tablet (325 mg total) by mouth daily., Disp: 90 tablet, Rfl: 3   fluticasone (FLONASE) 50 MCG/ACT nasal spray, PLACE 2 SPRAYS INTO EACH NOSTRIL ONCE A DAY, Disp: 16 g, Rfl: 6   guaiFENesin-codeine 100-10 MG/5ML syrup, Take 5 mLs by mouth 3 (three) times daily as needed for cough., Disp: 120 mL, Rfl: 0   hydrALAZINE (APRESOLINE) 25 MG tablet, Take 1 tablet (25 mg total) by mouth 2 (two) times daily., Disp: 180 tablet, Rfl: 3   Multiple Vitamins-Minerals (CENTRUM  ADULT PO), Take 1 tablet by mouth daily., Disp: , Rfl:    Potassium Chloride ER 20 MEQ TBCR, Take 1 tablet (20 mEq total) by mouth daily. Take while taking hydrochlorothiazide/HCTZ, Disp: 30 tablet, Rfl: 1   Allergies: Allergies  Allergen Reactions   Lisinopril Cough    REVIEW OF SYSTEMS:   Review of Systems  Constitutional:  Negative for chills, fatigue and fever.  HENT:   Negative for lump/mass, mouth sores, nosebleeds, sore throat and trouble swallowing.   Eyes:  Negative for eye problems.  Respiratory:  Negative for cough and shortness of breath.   Cardiovascular:  Negative for chest pain, leg swelling and palpitations.  Gastrointestinal:  Negative for abdominal pain, constipation, diarrhea, nausea and vomiting.  Genitourinary:  Negative for bladder incontinence, difficulty urinating, dysuria, frequency, hematuria and nocturia.   Musculoskeletal:  Negative for arthralgias, back pain, flank pain, myalgias and neck pain.  Skin:  Negative for itching and rash.  Neurological:  Negative for dizziness, headaches and numbness.  Hematological:  Does not bruise/bleed easily.  Psychiatric/Behavioral:  Negative for depression, sleep disturbance and suicidal ideas. The patient is not nervous/anxious.   All other systems reviewed and are negative.    VITALS:   Blood pressure (!) 155/68, pulse 81, temperature (!) 97.5 F (36.4 C), temperature source Oral, resp. rate 18, weight 172 lb (78 kg), SpO2 99%.  Wt Readings from Last 3 Encounters:  12/25/23 172 lb (78 kg)  11/30/23 168 lb 3.4 oz (76.3 kg)  11/09/23 171 lb 12.8 oz (77.9 kg)    Body  mass index is 30.47 kg/m.  Performance status (ECOG): 1 - Symptomatic but completely ambulatory  PHYSICAL EXAM:   Physical Exam Vitals and nursing note reviewed. Exam conducted with a chaperone present.  Constitutional:      Appearance: Normal appearance.  Cardiovascular:     Rate and Rhythm: Normal rate and regular rhythm.     Pulses: Normal  pulses.     Heart sounds: Normal heart sounds.  Pulmonary:     Effort: Pulmonary effort is normal.     Breath sounds: Normal breath sounds.  Abdominal:     Palpations: Abdomen is soft. There is no hepatomegaly, splenomegaly or mass.     Tenderness: There is no abdominal tenderness.  Musculoskeletal:     Right lower leg: No edema.     Left lower leg: No edema.  Lymphadenopathy:     Cervical: No cervical adenopathy.     Right cervical: No superficial, deep or posterior cervical adenopathy.    Left cervical: No superficial, deep or posterior cervical adenopathy.     Upper Body:     Right upper body: No supraclavicular or axillary adenopathy.     Left upper body: No supraclavicular or axillary adenopathy.  Neurological:     General: No focal deficit present.     Mental Status: She is alert and oriented to person, place, and time.  Psychiatric:        Mood and Affect: Mood normal.        Behavior: Behavior normal.     LABS:      Latest Ref Rng & Units 12/25/2023   11:49 AM 11/30/2023   12:01 PM 11/09/2023    1:30 PM  CBC  WBC 4.0 - 10.5 K/uL 6.3  6.1  7.8   Hemoglobin 12.0 - 15.0 g/dL 93.7  16.9  67.8   Hematocrit 36.0 - 46.0 % 37.5  38.9  35.7   Platelets 150 - 400 K/uL 148  158  162       Latest Ref Rng & Units 12/25/2023   11:49 AM 11/30/2023   12:01 PM 11/09/2023    1:30 PM  CMP  Glucose 70 - 99 mg/dL 938  101  751   BUN 8 - 23 mg/dL 15  16  16    Creatinine 0.44 - 1.00 mg/dL 0.25  8.52  7.78   Sodium 135 - 145 mmol/L 139  137  139   Potassium 3.5 - 5.1 mmol/L 3.4  3.5  3.7   Chloride 98 - 111 mmol/L 100  101  103   CO2 22 - 32 mmol/L 28  28  27    Calcium 8.9 - 10.3 mg/dL 9.6  9.8  9.8   Total Protein 6.5 - 8.1 g/dL 7.9  7.7  7.5   Total Bilirubin 0.0 - 1.2 mg/dL 0.5  0.5  0.3   Alkaline Phos 38 - 126 U/L 80  73  82   AST 15 - 41 U/L 20  17  17    ALT 0 - 44 U/L 14  13  13       Lab Results  Component Value Date   CEA1 1.9 11/09/2023   /  CEA  Date Value Ref  Range Status  11/09/2023 1.9 0.0 - 4.7 ng/mL Final    Comment:    (NOTE)                             Nonsmokers          <  3.9                             Smokers             <5.6 Roche [employer] Electrochemiluminescence Immunoassay (ECLIA) Values obtained with different assay methods or kits cannot be used interchangeably.  Results cannot be interpreted as absolute evidence of the presence or absence of malignant disease. Performed At: [facility] [address] [place], [place] [id number] Jolene Schimke MD WU:[id number]    No results found for: "PSA1" No results found for: [phone]" No results found for: "CAN125"  No results found for: "TOTALPROTELP", "ALBUMINELP", "A1GS", "A2GS", "BETS", "BETA2SER", "GAMS", "MSPIKE", "SPEI" Lab Results  Component Value Date   TIBC 371 03/01/2023   TIBC 396 05/13/2020   TIBC 395 05/13/2020   FERRITIN 52 03/01/2023   FERRITIN 65 05/13/2020   FERRITIN 64 05/13/2020   IRONPCTSAT 18 03/01/2023   IRONPCTSAT 23 05/13/2020   IRONPCTSAT 23 05/13/2020   No results found for: "LDH"   STUDIES:   CT CHEST ABDOMEN PELVIS W CONTRAST Result Date: 12/23/2023 CLINICAL DATA:  Colon cancer, metastatic disease evaluation, status post right hemicolectomy * Tracking Code: BO * EXAM: CT CHEST, ABDOMEN, AND PELVIS WITH CONTRAST TECHNIQUE: Multidetector CT imaging of the chest, abdomen and pelvis was performed following the standard protocol during bolus administration of intravenous contrast. RADIATION DOSE REDUCTION: This exam was performed according to the departmental dose-optimization program which includes automated exposure control, adjustment of the mA and/or kV according to patient size and/or use of iterative reconstruction technique. CONTRAST:  OMNIPAQUE IOHEXOL 300 MG/ML SOLN additional oral enteric contrast COMPARISON:  09/22/2023 FINDINGS: CT CHEST FINDINGS Cardiovascular: Aortic atherosclerosis. Left chest port catheter. Normal heart  size. Left coronary artery calcifications. No pericardial effusion. Mediastinum/Nodes: No enlarged mediastinal, hilar, or axillary lymph nodes. Thyroid gland, trachea, and esophagus demonstrate no significant findings. Lungs/Pleura: Multiple unchanged small bilateral pulmonary nodules, largest in the central right lower lobe measuring 0.7 cm (series 4, image 79), additional index nodule of the dependent left lower lobe measuring 0.6 cm (series 4, image 90). No new nodules. Bandlike bibasilar scarring. No pleural effusion or pneumothorax. Musculoskeletal: No chest wall abnormality. No acute osseous findings. CT ABDOMEN PELVIS FINDINGS Hepatobiliary: No solid liver abnormality is seen. No gallstones, gallbladder wall thickening, or biliary dilatation. Pancreas: Unremarkable. No pancreatic ductal dilatation or surrounding inflammatory changes. Spleen: Normal in size without significant abnormality. Unchanged cyst of the medial spleen, benign, requiring no further follow-up or characterization (series 3, image 44). Adrenals/Urinary Tract: Adrenal glands are unremarkable. Kidneys are normal, without renal calculi, solid lesion, or hydronephrosis. Unchanged mild distention of the urinary bladder with diffuse urinary bladder wall thickening (series 3, image 69). Stomach/Bowel: Stomach is within normal limits. Status post partial right hemicolectomy and ileocolic reanastomosis. No evidence of bowel wall thickening, distention, or inflammatory changes. Vascular/Lymphatic: Aortic atherosclerosis. No enlarged abdominal or pelvic lymph nodes. Reproductive: No mass or other abnormality. Other: Small broad-based midline fat containing ventral hernia. No ascites. Musculoskeletal: No acute osseous findings. IMPRESSION: 1. Status post partial right hemicolectomy and ileocolic reanastomosis. 2. Multiple unchanged small bilateral pulmonary nodules, largest in the central right lower lobe measuring 0.7 cm. No new nodules. Attention on  follow-up. 3. No evidence of lymphadenopathy or metastatic disease in the abdomen or pelvis. 4. Unchanged mild distention of the urinary bladder with diffuse urinary bladder wall thickening. Correlate for urinary retention or bladder outlet obstruction. 5. Coronary  artery disease. Aortic Atherosclerosis (ICD10-I70.0). Electronically Signed   By: Jearld Lesch M.D.   On: 12/23/2023 14:23

## 2023-12-26 LAB — CEA: CEA: 2.4 ng/mL (ref 0.0–4.7)

## 2023-12-26 NOTE — Progress Notes (Signed)
[facility] - Clinic Note  01/03/2024    CHIEF COMPLAINT Patient presents for Retina Follow Up  HISTORY OF PRESENT ILLNESS: Gabrielle [name] is a 78 y.o. female who presents to the clinic today for:   HPI     Retina Follow Up   Patient presents with  Wet AMD.  In both eyes.  Severity is moderate.  Duration of 6 weeks.  Since onset it is stable.  I, the attending physician,  performed the HPI with the patient and updated documentation appropriately.        Comments   Patient states the vision is the same. She is using Cosopt OU BID. She does not check her blood sugar.       Last edited by [name], MD on 01/03/2024  8:42 AM.    Patient states she had a CT scan and all her cancer cells are gone, she feels like her vision is the same  Referring physician: [name], DO [address] [place],  [place] [address]  HISTORICAL INFORMATION:   Selected notes from the MEDICAL RECORD NUMBER Referred by Dr. Mercie Eon for concern of ARMD OU   CURRENT MEDICATIONS: Current Outpatient Medications (Ophthalmic Drugs)  Medication Sig   dorzolamide-timolol (COSOPT) 2-0.5 % ophthalmic solution INSTILL ONE DROP IN [facility] EYE TWICE DAILY   No current facility-administered medications for this visit. (Ophthalmic Drugs)   Current Outpatient Medications (Other)  Medication Sig   alendronate (FOSAMAX) 70 MG tablet TAKE ONE TABLET EVERY 7 DAYS ON AN EMPTY STOMACH WITH A FULL GLASS OF WATER   amLODipine (NORVASC) 10 MG tablet Take 1 tablet (10 mg total) by mouth daily. For blood pressure   atorvastatin (LIPITOR) 20 MG tablet Take 1 tablet (20 mg total) by mouth daily. For cholesterol   cholecalciferol (VITAMIN D3) 25 MCG (1000 UT) tablet Take 1,000 Units by mouth daily.   CRANBERRY FRUIT PO Take 1 tablet by mouth daily.   diclofenac Sodium (VOLTAREN) 1 % GEL APPLY 4 GRAMS TO AFFECTED AREA(S) 4 TIMES A DAY (Patient taking differently: Apply 4 g topically 4 (four) times  daily as needed (Pain).)   ELIQUIS 5 MG TABS tablet Take 1 tablet (5 mg total) by mouth 2 (two) times daily.   Ferrous Sulfate (IRON) 325 (65 Fe) MG TABS Take 1 tablet (325 mg total) by mouth daily.   fluticasone (FLONASE) 50 MCG/ACT nasal spray PLACE 2 SPRAYS INTO EACH NOSTRIL ONCE A DAY   guaiFENesin-codeine 100-10 MG/5ML syrup Take 5 mLs by mouth 3 (three) times daily as needed for cough.   hydrALAZINE (APRESOLINE) 25 MG tablet Take 1 tablet (25 mg total) by mouth 2 (two) times daily.   Multiple Vitamins-Minerals (CENTRUM ADULT PO) Take 1 tablet by mouth daily.   Potassium Chloride ER 20 MEQ TBCR Take 1 tablet (20 mEq total) by mouth daily. Take while taking hydrochlorothiazide/HCTZ   No current facility-administered medications for this visit. (Other)   REVIEW OF SYSTEMS: ROS   Positive for: Gastrointestinal, Genitourinary, Musculoskeletal, Cardiovascular, Eyes Negative for: Constitutional, Neurological, Skin, HENT, Endocrine, Respiratory, Psychiatric, Allergic/Imm, Heme/Lymph Last edited by Charlette Caffey, COT on 01/03/2024  7:51 AM.       ALLERGIES Allergies  Allergen Reactions   Lisinopril Cough   PAST MEDICAL HISTORY Past Medical History:  Diagnosis Date   Arthritis    Cancer [facility])    Cataract    OU   Colon cancer (HCC)    Family history of  pancreatic cancer 08/21/2020   Family history of uterine cancer 08/21/2020   H/O cesarean section    Hx of tonsillectomy    Hyperlipidemia    Hypertension    Kidney disease    Macular degeneration    Exu ARMD OU   Paroxysmal atrial fibrillation (HCC) 07/05/2020   Stroke (cerebrum) (HCC) 07/05/2020   Urinary retention 04/15/2020   Past Surgical History:  Procedure Laterality Date   BIOPSY  04/06/2020   Procedure: BIOPSY;  Surgeon: Malissa Hippo, MD;  Location: [facility];  Service: Endoscopy;;   CARPAL TUNNEL RELEASE Right    CATARACT EXTRACTION W/PHACO Left 10/11/2019   Procedure: CATARACT EXTRACTION PHACO AND  INTRAOCULAR LENS PLACEMENT (IOC);  Surgeon: Fabio Pierce, MD;  Location: [facility] OR[facility];  Service: Ophthalmology;  Laterality: Left;  CDE: 8.56   CATARACT EXTRACTION W/PHACO Right 10/25/2019   Procedure: CATARACT EXTRACTION PHACO AND INTRAOCULAR LENS PLACEMENT (IOC);  Surgeon: Fabio Pierce, MD;  Location: [facility];  Service: Ophthalmology;  Laterality: Right;  CDE: 5.67   CESAREAN SECTION     COLONOSCOPY N/A 04/06/2020   Procedure: COLONOSCOPY;  Surgeon: Malissa Hippo, MD;  Location: [facility];  Service: Endoscopy;  Laterality: N/A;   CYSTOSCOPY WITH BIOPSY N/A 04/08/2020   Procedure: CYSTOSCOPY WITH BIOPSY;  Surgeon: Malen Gauze, MD;  Location: [facility];  Service: Urology;  Laterality: N/A;   ESOPHAGOGASTRODUODENOSCOPY N/A 04/05/2020   Procedure: ESOPHAGOGASTRODUODENOSCOPY (EGD);  Surgeon: Malissa Hippo, MD;  [facility]: [facility];  Service: Endoscopy;  Laterality: N/A;   PARTIAL COLECTOMY N/A 04/08/2020   Procedure: PARTIAL COLECTOMY;  Surgeon: Lucretia Roers, MD;  Location: [facility];  Service: General;  Laterality: N/A;   PORTACATH PLACEMENT Left 05/18/2020   Procedure: INSERTION PORT-A-CATH (ATTACHED CATHETER IN LEFT SUBCLAVIAN);  Surgeon: Lucretia Roers, MD;  Location: [facility];  Service: General;  Laterality: Left;   TONSILLECTOMY     FAMILY HISTORY Family History  Problem Relation Age of Onset   Macular degeneration Mother    Stroke Mother    Hypertension Mother    Dementia Mother    Lung cancer Father        dx late 63s; smoking hx   Diabetes Sister    Hypertension Sister    Leukemia Paternal Uncle        d. 84s   Cancer Maternal Aunt        ovarian or endometrial dx 42s   Pancreatic cancer Paternal Uncle        d. late 54s   SOCIAL HISTORY Social History   Tobacco Use   Smoking status: Never   Smokeless tobacco: Never  Vaping Use   Vaping status: Never Used  Substance Use Topics   Alcohol use: No   Drug use: No       OPHTHALMIC EXAM: Base Eye Exam      Visual Acuity (Snellen - Linear)       Right Left   Dist cc CF at 3' 20/60   Dist ph cc NI NI    Correction: Glasses         Tonometry (Tonopen, 7:54 AM)       Right Left   Pressure 17 14         Pupils       Dark Light Shape React APD   Right 3 2 Round Brisk None   Left 3 2 Round Brisk None         Visual Fields  Left Right    Full Full         Extraocular Movement       Right Left    Full, Ortho Full, Ortho         Neuro/Psych     Oriented x3: Yes   Mood/Affect: Normal         Dilation     Both eyes: 1.0% Mydriacyl, 2.5% Phenylephrine @ 7:51 AM           Slit Lamp and Fundus Exam     Slit Lamp Exam       Right Left   Lids/Lashes Dermatochalasis - upper lid Dermatochalasis - upper lid   Conjunctiva/Sclera 1+ Injection Trace Injection   Cornea Arcus, 3+ fine Punctpate epithelial erosions Arcus, 3+ fine Punctate epithelial erosions, mild tear film debris   Anterior Chamber Deep and quiet Deep and quiet   Iris Round and well dilated Round and well dilated   Lens PCIOL in good position, 1+ Posterior capsular opacification PC IOL in good position with open PC   Anterior Vitreous Vitreous syneresis, Posterior vitreous detachment, vitreous condensations Vitreous syneresis, Posterior vitreous detachment, silicone oil micro drops         Fundus Exam       Right Left   Disc pink and sharp, compact mild pallor, sharp rim, Compact, vascular loops superiorly   C/D Ratio 0.2 0.2   Macula Blunted foveal reflex, large central GA, Drusen, Pigment clumping and atrophy, central thickening/pigmented disciform scar, +PED, punctate IRH superior macula within atrophy -- stably resolved, persistent trace cystic changes overlying central scar Blunted foveal reflex, central thickening, RPE clumping and atrophy, Drusen, trace cystic changes overlying PED -- persistent, no heme   Vessels Vascular attenuation, Tortuous Vascular attenuation, Tortuous    Periphery Attached, mild Reticular degeneration Attached, mild Reticular degeneration           Refraction     Wearing Rx       Sphere Cylinder Add   Right [place] Sphere +3.50   Left -0.50 Sphere +3.50    Type: PAL           IMAGING AND PROCEDURES  Imaging and Procedures for 03/21/18  OCT, Retina - OU - Both Eyes       Right Eye Quality was good. Central Foveal Thickness: 503. Progression has improved. Findings include no SRF, abnormal foveal contour, retinal drusen , outer retinal tubulation, subretinal hyper-reflective material, disciform scar, epiretinal membrane, intraretinal fluid, pigment epithelial detachment, outer retinal atrophy (Persistent cystic changes overlying stable central SRHM / disciform scar -- slightly improved).   Left Eye Quality was good. Central Foveal Thickness: 320. Progression has improved. Findings include no SRF, abnormal foveal contour, retinal drusen , subretinal hyper-reflective material, intraretinal fluid, pigment epithelial detachment, outer retinal atrophy (Trace persistent cystic changes nasal fovea and macula overlying PED/SRHM -- slightly improved).   Notes *Images captured and stored on drive  Diagnosis / Impression:  Exudative ARMD OU OD: persistent cystic changes overlying stable central SRHM / disciform scar -- slightly improved OS: trace persistent cystic changes nasal fovea and macula overlying PED/SRHM -- slightly improved  Clinical management:  See below  Abbreviations: NFP - Normal foveal profile. CME - cystoid macular edema. PED - pigment epithelial detachment. IRF - intraretinal fluid. SRF - subretinal fluid. EZ - ellipsoid zone. ERM - epiretinal membrane. ORA - outer retinal atrophy. ORT - outer retinal tubulation. SRHM - subretinal hyper-reflective material      Intravitreal Injection, Pharmacologic Agent -  OS - Left Eye       Time Out 01/03/2024. 7:58 AM. Confirmed correct patient, procedure, site, and patient  consented.   Anesthesia Topical anesthesia was used. Anesthetic medications included Lidocaine 2%, Proparacaine 0.5%.   Procedure Preparation included 5% betadine to ocular surface, eyelid speculum. A supplied (32g) needle was used.   Injection: 6 mg faricimab-svoa 6 MG/0.05ML   Route: Intravitreal, Site: Left Eye   NDC: 09811-914-78, Lot: [id number], Expiration date: 04/03/2025, Waste: 0 mL   Post-op Post injection exam found visual acuity of at least counting fingers. The patient tolerated the procedure well. There were no complications. The patient received written and verbal post procedure care education. Post injection medications were not given.            ASSESSMENT/PLAN:    ICD-10-CM   1. Exudative age-related macular degeneration of both eyes with active choroidal neovascularization (HCC)  H35.3231 OCT, Retina - OU - Both Eyes    Intravitreal Injection, Pharmacologic Agent - OS - Left Eye    faricimab-svoa (VABYSMO) 6mg /0.68mL intravitreal injection    2. Posterior vitreous detachment of both eyes  H43.813     3. Essential hypertension  I10     4. Hypertensive retinopathy of both eyes  H35.033     5. Pseudophakia of both eyes  Z96.1     6. Ocular hypertension, bilateral  H40.053     7. Dry eyes  H04.123      1. Exudative age-related macular degeneration, both eyes - severe exudative disease with very active CNVM OU at presentation in January 2019  - S/P IVA OD #1 (01.04.19), #2 (02.15.19)  - S/P IVA OS #1 (01.18.19), #2 (02.15.19)  - switched to Eylea 3.18.19 due to severity of disease - S/P IVE OD #1 (03.18.19), #2 (04.17.19), #3 (05.15.19), #4 (10.02.19),  #5 (01.15.20), #6 (06.04.20), #7 (10.29.20), #8 (01.21.21), #9 (05.27.21), #10 (07.22.21) -- injections held due to stable disciform scar, #11 (08.12.24) for new IRH, #12 (09.25.24), #13 (11.06.24) - S/P IVE OS #1 (03.18.19), #2 (04.17.19), #3 (05.15.19), #4 (06.12.19), #5 (07.10.19), #6 (08.07.19), #7  (09.04.19), #8 (10.02.19), #9 (11.06.19), #10 (12.11.19), #11 (01.15.20), #12 (02.20.20), #13 (03.26.20), #14 (04.30.20), #15 (06.04.20), #16 (07.16.20), # 17 (08.20.20), #18 (09.24.20), #19 (10.29.20), #20 (12.03.20), #21 (01.21.21), #22 (03.18.21), #23 (05.27.21), #24 (07.22.21), #25 (11.11.21), #26 (12.09.21), #27 (01.06.22), #28 (2.7.22), #29 (03.24.22), #30 (4.28.22), #31 (05.31.22), #32 (06.30.22), #33 (08.04.22), #34 (09.15.22), #35 (10.27.22), #36 (12.08.22), #37 (01.12.23), #38 (02.16.23), #39 (03.30.23), #40 (05.11.23), #41 (06.22.23), #42 (07.27.23), #43 (09.01.23), #44 (10.06.23), #45 (11.10.23), #46 (12.15.23) -- IVE resistance  - s/p IVV OS #1 (01.12.24 -- sample), #2 (02.09.24), #3 (03.15.24), #4 (04.19.24), #5 (05.24.24), #6 (07.01.24), #7 (08.12.24), #8 (09.25.24), #9 (11.06.24), #10 (12.18.24)  - IVE OS held from 7.22.21 to 11.11.21 due to TIA/stroke on 8.1.21 - OCT OD: persistent cystic changes overlying stable central SRHM / disciform scar -- slightly improved; OS: trace persistent cystic changes nasal fovea and macula overlying PED/SRHM -- slightly improved at 6 weeks  - exam OD with focal IRH superior macula within atrophy -- improved  - BCVA OD stable at CF 3'; OS stable at 20/60  - recommend IVV OS #11 today, 01.29.2 w/ f/u at 6 wks again - IVV authorization obtained - will hold IVE OD today -- pt in agreement - RBA of procedure discussed, questions answered - IVV informed consent obtained and signed on 01.29.25 - see procedure note   - [employer] funding unavailable -- pt  covering 20% coinsurance - f/u in 6 weeks, sooner prn for DFE/OCT/possible injection(s)  2. PVD / vitreous syneresis  - Discussed findings and prognosis   - No RT or RD on 360 exam  - Reviewed s/s of RT/RD  - strict return precautions for any such RT/RD symptoms  3,4. Hypertensive retinopathy OU - exam shows persistent focal IRH OD within GA - discussed importance of tight BP control - monitor  5.  Pseudophakia OU  - s/p CE/IOL (Dr. June Leap, 11.2020)  - beautiful surgeries w/ IOLs in excellent position, doing well  - monitor  6. Ocular Hypertension OU  - IOP 17,14  - continue cosopt bid OU  7. Dry eyes OU - recommend artificial tears and lubricating ointment as needed  Ophthalmic Meds Ordered this visit:  Meds ordered this encounter  Medications   faricimab-svoa (VABYSMO) 6mg /0.58mL intravitreal injection     Return in about 6 weeks (around 02/14/2024) for f/u exu ARMD OU, DFE, OCT.  There are no Patient Instructions on file for this visit. This document serves as a record of services personally performed by Karie Chimera, MD, PhD. It was created on their behalf by Glee Arvin. Manson Passey, OA an ophthalmic technician. The creation of this record is the provider's dictation and/or activities during the visit.    Electronically signed by: Glee Arvin. Kristopher Oppenheim 01/03/24 12:33 PM  Karie Chimera, M.D., Ph.D. Diseases & Surgery of the Retina and Vitreous [facility] 01/03/2024   I have reviewed the above documentation for accuracy and completeness, and I agree with the above. Karie Chimera, M.D., Ph.D. 01/03/24 12:40 PM   Abbreviations: M myopia (nearsighted); A astigmatism; H hyperopia (farsighted); P presbyopia; Mrx spectacle prescription;  CTL contact lenses; OD right eye; OS left eye; OU both eyes  XT exotropia; ET esotropia; PEK punctate epithelial keratitis; PEE punctate epithelial erosions; DES dry eye syndrome; MGD meibomian gland dysfunction; ATs artificial tears; PFAT's preservative free artificial tears; NSC nuclear sclerotic cataract; PSC posterior subcapsular cataract; ERM epi-retinal membrane; PVD posterior vitreous detachment; RD retinal detachment; DM diabetes mellitus; DR diabetic retinopathy; NPDR non-proliferative diabetic retinopathy; PDR proliferative diabetic retinopathy; CSME clinically significant macular edema; DME diabetic macular edema;  dbh dot blot hemorrhages; CWS cotton wool spot; POAG primary open angle glaucoma; C/D cup-to-disc ratio; HVF humphrey visual field; GVF goldmann visual field; OCT optical coherence tomography; IOP intraocular pressure; BRVO Branch retinal vein occlusion; CRVO central retinal vein occlusion; CRAO central retinal artery occlusion; BRAO branch retinal artery occlusion; RT retinal tear; SB scleral buckle; PPV pars plana vitrectomy; VH Vitreous hemorrhage; PRP panretinal laser photocoagulation; IVK intravitreal kenalog; VMT vitreomacular traction; MH Macular hole;  NVD neovascularization of the disc; NVE neovascularization elsewhere; AREDS age related eye disease study; ARMD age related macular degeneration; POAG primary open angle glaucoma; EBMD epithelial/anterior basement membrane dystrophy; ACIOL anterior chamber intraocular lens; IOL intraocular lens; PCIOL posterior chamber intraocular lens; Phaco/IOL phacoemulsification with intraocular lens placement; PRK photorefractive keratectomy; LASIK laser assisted in situ keratomileusis; HTN hypertension; DM diabetes mellitus; COPD chronic obstructive pulmonary disease

## 2024-01-03 ENCOUNTER — Ambulatory Visit (INDEPENDENT_AMBULATORY_CARE_PROVIDER_SITE_OTHER): Payer: Medicare HMO | Admitting: Ophthalmology

## 2024-01-03 ENCOUNTER — Encounter (INDEPENDENT_AMBULATORY_CARE_PROVIDER_SITE_OTHER): Payer: Self-pay | Admitting: Ophthalmology

## 2024-01-03 DIAGNOSIS — H43813 Vitreous degeneration, bilateral: Secondary | ICD-10-CM

## 2024-01-03 DIAGNOSIS — H35033 Hypertensive retinopathy, bilateral: Secondary | ICD-10-CM | POA: Diagnosis not present

## 2024-01-03 DIAGNOSIS — H353231 Exudative age-related macular degeneration, bilateral, with active choroidal neovascularization: Secondary | ICD-10-CM

## 2024-01-03 DIAGNOSIS — I10 Essential (primary) hypertension: Secondary | ICD-10-CM | POA: Diagnosis not present

## 2024-01-03 DIAGNOSIS — H40053 Ocular hypertension, bilateral: Secondary | ICD-10-CM

## 2024-01-03 DIAGNOSIS — H04123 Dry eye syndrome of bilateral lacrimal glands: Secondary | ICD-10-CM

## 2024-01-03 DIAGNOSIS — Z961 Presence of intraocular lens: Secondary | ICD-10-CM | POA: Diagnosis not present

## 2024-01-03 MED ORDER — FARICIMAB-SVOA 6 MG/0.05ML IZ SOSY
6.0000 mg | PREFILLED_SYRINGE | INTRAVITREAL | Status: AC | PRN
  Administered 2024-01-03: 6 mg via INTRAVITREAL

## 2024-01-24 ENCOUNTER — Other Ambulatory Visit: Payer: Self-pay | Admitting: *Deleted

## 2024-01-24 MED ORDER — POTASSIUM CHLORIDE ER 20 MEQ PO TBCR: 20.0000 meq | EXTENDED_RELEASE_TABLET | Freq: Every day | ORAL | 3 refills | Status: DC

## 2024-02-06 NOTE — Progress Notes (Signed)
 [facility] - Clinic Note  02/14/2024    CHIEF COMPLAINT Patient presents for Retina Follow Up  HISTORY OF PRESENT ILLNESS: Gabrielle [name] is a 78 y.o. female who presents to the clinic today for:   HPI     Retina Follow Up   Patient presents with  Wet AMD.  In both eyes.  This started 6 weeks ago.  Duration of 6 weeks.  Since onset it is stable.  I, the attending physician,  performed the HPI with the patient and updated documentation appropriately.        Comments   6 week retina follow up ARMD and IVV OS pt is reporting no vision changes noticed she denies any flashes or floaters       Last edited by [name], MD on 02/14/2024  8:56 AM.    Patient states vision is about the same  Referring physician: [name], DO [address] [place],  [place] [address]  HISTORICAL INFORMATION:   Selected notes from the MEDICAL RECORD NUMBER Referred by Dr. Mercie Eon for concern of ARMD OU   CURRENT MEDICATIONS: Current Outpatient Medications (Ophthalmic Drugs)  Medication Sig   dorzolamide-timolol (COSOPT) 2-0.5 % ophthalmic solution INSTILL ONE DROP IN [facility] EYE TWICE DAILY   No current facility-administered medications for this visit. (Ophthalmic Drugs)   Current Outpatient Medications (Other)  Medication Sig   alendronate (FOSAMAX) 70 MG tablet Take 1 tablet (70 mg total) by mouth once a week. On an empty stomach with a full glass of water **NEEDS TO BE SEEN BEFORE NEXT REFILL**   amLODipine (NORVASC) 10 MG tablet Take 1 tablet (10 mg total) by mouth daily. For blood pressure   atorvastatin (LIPITOR) 20 MG tablet Take 1 tablet (20 mg total) by mouth daily. For cholesterol   cholecalciferol (VITAMIN D3) 25 MCG (1000 UT) tablet Take 1,000 Units by mouth daily.   CRANBERRY FRUIT PO Take 1 tablet by mouth daily.   diclofenac Sodium (VOLTAREN) 1 % GEL APPLY 4 GRAMS TO AFFECTED AREA(S) 4 TIMES A DAY (Patient taking differently: Apply 4 g topically 4  (four) times daily as needed (Pain).)   ELIQUIS 5 MG TABS tablet Take 1 tablet (5 mg total) by mouth 2 (two) times daily.   Ferrous Sulfate (IRON) 325 (65 Fe) MG TABS Take 1 tablet (325 mg total) by mouth daily.   fluticasone (FLONASE) 50 MCG/ACT nasal spray PLACE 2 SPRAYS INTO EACH NOSTRIL ONCE A DAY   guaiFENesin-codeine 100-10 MG/5ML syrup Take 5 mLs by mouth 3 (three) times daily as needed for cough.   hydrALAZINE (APRESOLINE) 25 MG tablet Take 1 tablet (25 mg total) by mouth 2 (two) times daily.   Multiple Vitamins-Minerals (CENTRUM ADULT PO) Take 1 tablet by mouth daily.   Potassium Chloride ER 20 MEQ TBCR Take 1 tablet (20 mEq total) by mouth daily. Take while taking hydrochlorothiazide/HCTZ   No current facility-administered medications for this visit. (Other)   REVIEW OF SYSTEMS: ROS   Positive for: Gastrointestinal, Genitourinary, Musculoskeletal, Cardiovascular, Eyes Negative for: Constitutional, Neurological, Skin, HENT, Endocrine, Respiratory, Psychiatric, Allergic/Imm, Heme/Lymph Last edited by Etheleen Mayhew, COT on 02/14/2024  7:58 AM.     ALLERGIES Allergies  Allergen Reactions   Lisinopril Cough   PAST MEDICAL HISTORY Past Medical History:  Diagnosis Date   Arthritis    Cancer [facility])    Cataract    OU   Colon cancer (HCC)    Family history of pancreatic  cancer 08/21/2020   Family history of uterine cancer 08/21/2020   H/O cesarean section    Hx of tonsillectomy    Hyperlipidemia    Hypertension    Kidney disease    Macular degeneration    Exu ARMD OU   Paroxysmal atrial fibrillation (HCC) 07/05/2020   Stroke (cerebrum) (HCC) 07/05/2020   Urinary retention 04/15/2020   Past Surgical History:  Procedure Laterality Date   BIOPSY  04/06/2020   Procedure: BIOPSY;  Surgeon: Malissa Hippo, MD;  Location: [facility];  Service: Endoscopy;;   CARPAL TUNNEL RELEASE Right    CATARACT EXTRACTION W/PHACO Left 10/11/2019   Procedure: CATARACT EXTRACTION  PHACO AND INTRAOCULAR LENS PLACEMENT (IOC);  Surgeon: Fabio Pierce, MD;  Location: [facility];  Service: Ophthalmology;  Laterality: Left;  CDE: 8.56   CATARACT EXTRACTION W/PHACO Right 10/25/2019   Procedure: CATARACT EXTRACTION PHACO AND INTRAOCULAR LENS PLACEMENT (IOC);  Surgeon: Fabio Pierce, MD;  Location: [facility];  Service: Ophthalmology;  Laterality: Right;  CDE: 5.67   CESAREAN SECTION     COLONOSCOPY N/A 04/06/2020   Procedure: COLONOSCOPY;  Surgeon: Malissa Hippo, MD;  Location: [facility];  Service: Endoscopy;  Laterality: N/A;   CYSTOSCOPY WITH BIOPSY N/A 04/08/2020   Procedure: CYSTOSCOPY WITH BIOPSY;  Surgeon: Malen Gauze, MD;  Location: [facility];  Service: Urology;  Laterality: N/A;   ESOPHAGOGASTRODUODENOSCOPY N/A 04/05/2020   Procedure: ESOPHAGOGASTRODUODENOSCOPY (EGD);  Surgeon: Malissa Hippo, MD;  [facility]: [facility];  Service: Endoscopy;  Laterality: N/A;   PARTIAL COLECTOMY N/A 04/08/2020   Procedure: PARTIAL COLECTOMY;  Surgeon: Lucretia Roers, MD;  Location: [facility];  Service: General;  Laterality: N/A;   PORTACATH PLACEMENT Left 05/18/2020   Procedure: INSERTION PORT-A-CATH (ATTACHED CATHETER IN LEFT SUBCLAVIAN);  Surgeon: Lucretia Roers, MD;  Location: [facility];  Service: General;  Laterality: Left;   TONSILLECTOMY     FAMILY HISTORY Family History  Problem Relation Age of Onset   Macular degeneration Mother    Stroke Mother    Hypertension Mother    Dementia Mother    Lung cancer Father        dx late 85s; smoking hx   Diabetes Sister    Hypertension Sister    Leukemia Paternal Uncle        d. 45s   Cancer Maternal Aunt        ovarian or endometrial dx 41s   Pancreatic cancer Paternal Uncle        d. late 2s   SOCIAL HISTORY Social History   Tobacco Use   Smoking status: Never   Smokeless tobacco: Never  Vaping Use   Vaping status: Never Used  Substance Use Topics   Alcohol use: No   Drug use: No       OPHTHALMIC EXAM: Base Eye  Exam     Visual Acuity (Snellen - Linear)       Right Left   Dist cc CF at 3' 20/60 -3   Dist ph cc NI NI    Correction: Glasses         Tonometry (Tonopen, 8:01 AM)       Right Left   Pressure 13 16         Pupils       Pupils Dark Light Shape React APD   Right PERRL 3 2 Round Brisk None   Left PERRL 3 2 Round Brisk None         Visual  Fields       Left Right    Full Full         Extraocular Movement       Right Left    Full, Ortho Full, Ortho         Neuro/Psych     Oriented x3: Yes   Mood/Affect: Normal         Dilation     Both eyes: 2.5% Phenylephrine @ 8:01 AM           Slit Lamp and Fundus Exam     Slit Lamp Exam       Right Left   Lids/Lashes Dermatochalasis - upper lid Dermatochalasis - upper lid   Conjunctiva/Sclera 1+ Injection Trace Injection   Cornea Arcus, 3+ fine Punctpate epithelial erosions Arcus, 3+ fine Punctate epithelial erosions, mild tear film debris   Anterior Chamber Deep and quiet Deep and quiet   Iris Round and well dilated Round and well dilated   Lens PCIOL in good position, 1+ Posterior capsular opacification PC IOL in good position with open PC   Anterior Vitreous Vitreous syneresis, Posterior vitreous detachment, vitreous condensations Vitreous syneresis, Posterior vitreous detachment, silicone oil micro drops         Fundus Exam       Right Left   Disc pink and sharp, compact mild pallor, sharp rim, Compact, vascular loops superiorly   C/D Ratio 0.2 0.2   Macula Blunted foveal reflex, large central GA, Drusen, Pigment clumping and atrophy, central thickening/pigmented disciform scar, +PED, punctate IRH superior macula within atrophy -- stably resolved, persistent trace cystic changes overlying central scar Blunted foveal reflex, central thickening, RPE clumping and atrophy, Drusen, trace cystic changes overlying PED -- persistent, no heme   Vessels Vascular attenuation, Tortuous Vascular attenuation,  Tortuous   Periphery Attached, mild Reticular degeneration Attached, mild Reticular degeneration           Refraction     Wearing Rx       Sphere Cylinder Add   Right [place] Sphere +3.50   Left -0.50 Sphere +3.50    Type: PAL           IMAGING AND PROCEDURES  Imaging and Procedures for 03/21/18  OCT, Retina - OU - Both Eyes       Right Eye Quality was good. Central Foveal Thickness: 505. Progression has been stable. Findings include no SRF, abnormal foveal contour, retinal drusen , outer retinal tubulation, subretinal hyper-reflective material, disciform scar, epiretinal membrane, intraretinal fluid, pigment epithelial detachment, outer retinal atrophy (Persistent cystic changes overlying stable central SRHM / disciform scar).   Left Eye Quality was good. Central Foveal Thickness: 272. Progression has been stable. Findings include no SRF, abnormal foveal contour, retinal drusen , subretinal hyper-reflective material, intraretinal fluid, pigment epithelial detachment, outer retinal atrophy (Trace persistent cystic changes nasal fovea and macula overlying PED/SRHM).   Notes *Images captured and stored on drive  Diagnosis / Impression:  Exudative ARMD OU OD: persistent cystic changes overlying stable central SRHM / disciform scar  OS: trace persistent cystic changes nasal fovea and macula overlying PED/SRHM  Clinical management:  See below  Abbreviations: NFP - Normal foveal profile. CME - cystoid macular edema. PED - pigment epithelial detachment. IRF - intraretinal fluid. SRF - subretinal fluid. EZ - ellipsoid zone. ERM - epiretinal membrane. ORA - outer retinal atrophy. ORT - outer retinal tubulation. SRHM - subretinal hyper-reflective material      Intravitreal Injection, Pharmacologic Agent - OS - Left  Eye       Time Out 02/14/2024. 8:34 AM. Confirmed correct patient, procedure, site, and patient consented.   Anesthesia Topical anesthesia was used. Anesthetic  medications included Lidocaine 2%, Proparacaine 0.5%.   Procedure Preparation included 5% betadine to ocular surface, eyelid speculum. A supplied (32g) needle was used.   Injection: 6 mg faricimab-svoa 6 MG/0.05ML   Route: Intravitreal, Site: Left Eye   NDC: 16109-604-54, Lot: [id number], Expiration date: 04/03/2025, Waste: 0 mL   Post-op Post injection exam found visual acuity of at least counting fingers. The patient tolerated the procedure well. There were no complications. The patient received written and verbal post procedure care education. Post injection medications were not given.            ASSESSMENT/PLAN:    ICD-10-CM   1. Exudative age-related macular degeneration of both eyes with active choroidal neovascularization (HCC)  H35.3231 OCT, Retina - OU - Both Eyes    Intravitreal Injection, Pharmacologic Agent - OS - Left Eye    faricimab-svoa (VABYSMO) 6mg /0.23mL intravitreal injection    2. Posterior vitreous detachment of both eyes  H43.813     3. Essential hypertension  I10     4. Hypertensive retinopathy of both eyes  H35.033     5. Pseudophakia of both eyes  Z96.1     6. Ocular hypertension, bilateral  H40.053     7. Dry eyes  H04.123      1. Exudative age-related macular degeneration, both eyes - severe exudative disease with very active CNVM OU at presentation in January 2019  - S/P IVA OD #1 (01.04.19), #2 (02.15.19)  - S/P IVA OS #1 (01.18.19), #2 (02.15.19)  - switched to Eylea 3.18.19 due to severity of disease - S/P IVE OD #1 (03.18.19), #2 (04.17.19), #3 (05.15.19), #4 (10.02.19),  #5 (01.15.20), #6 (06.04.20), #7 (10.29.20), #8 (01.21.21), #9 (05.27.21), #10 (07.22.21) -- injections held due to stable disciform scar, #11 (08.12.24) for new IRH, #12 (09.25.24), #13 (11.06.24) - S/P IVE OS #1 (03.18.19), #2 (04.17.19), #3 (05.15.19), #4 (06.12.19), #5 (07.10.19), #6 (08.07.19), #7 (09.04.19), #8 (10.02.19), #9 (11.06.19), #10 (12.11.19), #11 (01.15.20),  #12 (02.20.20), #13 (03.26.20), #14 (04.30.20), #15 (06.04.20), #16 (07.16.20), # 17 (08.20.20), #18 (09.24.20), #19 (10.29.20), #20 (12.03.20), #21 (01.21.21), #22 (03.18.21), #23 (05.27.21), #24 (07.22.21), #25 (11.11.21), #26 (12.09.21), #27 (01.06.22), #28 (2.7.22), #29 (03.24.22), #30 (4.28.22), #31 (05.31.22), #32 (06.30.22), #33 (08.04.22), #34 (09.15.22), #35 (10.27.22), #36 (12.08.22), #37 (01.12.23), #38 (02.16.23), #39 (03.30.23), #40 (05.11.23), #41 (06.22.23), #42 (07.27.23), #43 (09.01.23), #44 (10.06.23), #45 (11.10.23), #46 (12.15.23) -- IVE resistance  - s/p IVV OS #1 (01.12.24 -- sample), #2 (02.09.24), #3 (03.15.24), #4 (04.19.24), #5 (05.24.24), #6 (07.01.24), #7 (08.12.24), #8 (09.25.24), #9 (11.06.24), #10 (12.18.24), #11 (01.29.25)  - IVE OS held from 7.22.21 to 11.11.21 due to TIA/stroke on 8.1.21 - OCT OD: persistent cystic changes overlying stable central SRHM / disciform scar -- slightly improved; OS: trace persistent cystic changes nasal fovea and macula overlying PED/SRHM at 6 weeks  - exam OD with focal IRH superior macula within atrophy -- improved  - BCVA OD stable at CF 3'; OS stable at 20/60  - recommend IVV OS #12 today, 03.12.25 w/ f/u at 6 wks again - IVV authorization obtained and Good Days reinstated for her - will hold IVE OD today -- pt in agreement - RBA of procedure discussed, questions answered - IVV informed consent obtained and signed on 01.29.25 - see procedure note   - Good Days funding reinstated  for Gabrielle [name] as of 03.12.25 visit - f/u in 6 weeks, sooner prn for DFE/OCT/possible injection(s)  2. PVD / vitreous syneresis  - Discussed findings and prognosis   - No RT or RD on 360 exam  - Reviewed s/s of RT/RD  - strict return precautions for any such RT/RD symptoms  3,4. Hypertensive retinopathy OU - exam shows persistent focal IRH OD within GA - discussed importance of tight BP control - monitor  5. Pseudophakia OU  - s/p CE/IOL (Dr.  June Leap, 11.2020)  - beautiful surgeries w/ IOLs in excellent position, doing well  - monitor  6. Ocular Hypertension OU  - IOP 13,16  - continue cosopt bid OU  7. Dry eyes OU - recommend artificial tears and lubricating ointment as needed  Ophthalmic Meds Ordered this visit:  Meds ordered this encounter  Medications   faricimab-svoa (VABYSMO) 6mg /0.9mL intravitreal injection     Return in about 6 weeks (around 03/27/2024) for f/u exu ARMD OU, DFE, OCT, Possible Injxn.  There are no Patient Instructions on file for this visit.  This document serves as a record of services personally performed by Karie Chimera, MD, PhD. It was created on their behalf by Glee Arvin. Manson Passey, OA an ophthalmic technician. The creation of this record is the provider's dictation and/or activities during the visit.    Electronically signed by: Glee Arvin. Manson Passey, OA 02/15/24 2:39 PM   Karie Chimera, M.D., Ph.D. Diseases & Surgery of the Retina and Vitreous [facility] 02/14/2024   I have reviewed the above documentation for accuracy and completeness, and I agree with the above. Karie Chimera, M.D., Ph.D. 02/15/24 2:40 PM   Abbreviations: M myopia (nearsighted); A astigmatism; H hyperopia (farsighted); P presbyopia; Mrx spectacle prescription;  CTL contact lenses; OD right eye; OS left eye; OU both eyes  XT exotropia; ET esotropia; PEK punctate epithelial keratitis; PEE punctate epithelial erosions; DES dry eye syndrome; MGD meibomian gland dysfunction; ATs artificial tears; PFAT's preservative free artificial tears; NSC nuclear sclerotic cataract; PSC posterior subcapsular cataract; ERM epi-retinal membrane; PVD posterior vitreous detachment; RD retinal detachment; DM diabetes mellitus; DR diabetic retinopathy; NPDR non-proliferative diabetic retinopathy; PDR proliferative diabetic retinopathy; CSME clinically significant macular edema; DME diabetic macular edema; dbh dot blot  hemorrhages; CWS cotton wool spot; POAG primary open angle glaucoma; C/D cup-to-disc ratio; HVF humphrey visual field; GVF goldmann visual field; OCT optical coherence tomography; IOP intraocular pressure; BRVO Branch retinal vein occlusion; CRVO central retinal vein occlusion; CRAO central retinal artery occlusion; BRAO branch retinal artery occlusion; RT retinal tear; SB scleral buckle; PPV pars plana vitrectomy; VH Vitreous hemorrhage; PRP panretinal laser photocoagulation; IVK intravitreal kenalog; VMT vitreomacular traction; MH Macular hole;  NVD neovascularization of the disc; NVE neovascularization elsewhere; AREDS age related eye disease study; ARMD age related macular degeneration; POAG primary open angle glaucoma; EBMD epithelial/anterior basement membrane dystrophy; ACIOL anterior chamber intraocular lens; IOL intraocular lens; PCIOL posterior chamber intraocular lens; Phaco/IOL phacoemulsification with intraocular lens placement; PRK photorefractive keratectomy; LASIK laser assisted in situ keratomileusis; HTN hypertension; DM diabetes mellitus; COPD chronic obstructive pulmonary disease

## 2024-02-09 ENCOUNTER — Other Ambulatory Visit: Payer: Self-pay | Admitting: Family Medicine

## 2024-02-14 ENCOUNTER — Ambulatory Visit (INDEPENDENT_AMBULATORY_CARE_PROVIDER_SITE_OTHER): Payer: Medicare HMO | Admitting: Ophthalmology

## 2024-02-14 ENCOUNTER — Encounter (INDEPENDENT_AMBULATORY_CARE_PROVIDER_SITE_OTHER): Payer: Self-pay | Admitting: Ophthalmology

## 2024-02-14 DIAGNOSIS — H35033 Hypertensive retinopathy, bilateral: Secondary | ICD-10-CM | POA: Diagnosis not present

## 2024-02-14 DIAGNOSIS — Z961 Presence of intraocular lens: Secondary | ICD-10-CM | POA: Diagnosis not present

## 2024-02-14 DIAGNOSIS — H40053 Ocular hypertension, bilateral: Secondary | ICD-10-CM

## 2024-02-14 DIAGNOSIS — H43813 Vitreous degeneration, bilateral: Secondary | ICD-10-CM

## 2024-02-14 DIAGNOSIS — H353231 Exudative age-related macular degeneration, bilateral, with active choroidal neovascularization: Secondary | ICD-10-CM

## 2024-02-14 DIAGNOSIS — I10 Essential (primary) hypertension: Secondary | ICD-10-CM | POA: Diagnosis not present

## 2024-02-14 DIAGNOSIS — H04123 Dry eye syndrome of bilateral lacrimal glands: Secondary | ICD-10-CM

## 2024-02-14 MED ORDER — FARICIMAB-SVOA 6 MG/0.05ML IZ SOSY
6.0000 mg | PREFILLED_SYRINGE | INTRAVITREAL | Status: AC | PRN
  Administered 2024-02-14: 6 mg via INTRAVITREAL

## 2024-03-11 ENCOUNTER — Other Ambulatory Visit: Payer: Self-pay | Admitting: Family Medicine

## 2024-03-15 ENCOUNTER — Other Ambulatory Visit (INDEPENDENT_AMBULATORY_CARE_PROVIDER_SITE_OTHER): Payer: Self-pay | Admitting: Ophthalmology

## 2024-03-15 ENCOUNTER — Ambulatory Visit: Payer: Medicare HMO | Admitting: *Deleted

## 2024-03-15 VITALS — Ht 62.0 in | Wt 170.0 lb

## 2024-03-15 DIAGNOSIS — Z0000 Encounter for general adult medical examination without abnormal findings: Secondary | ICD-10-CM | POA: Diagnosis not present

## 2024-03-15 NOTE — Patient Instructions (Signed)
 Ms. Hamme , Thank you for taking time to come for your Medicare Wellness Visit. I appreciate your ongoing commitment to your health goals. Please review the following plan we discussed and let me know if I can assist you in the future.   Referrals/Orders/Follow-Ups/Clinician Recommendations: Remember to update your flu shot annually.  This is a list of the screening recommended for you and due dates:  Health Maintenance  Topic Date Due   Complete foot exam   Never done   Eye exam for diabetics  Never done   Yearly kidney health urinalysis for diabetes  Never done   Hemoglobin A1C  01/05/2021   DEXA scan (bone density measurement)  07/23/2023   COVID-19 Vaccine (4 - 2024-25 season) 08/06/2023   Flu Shot  07/05/2024   Yearly kidney function blood test for diabetes  12/24/2024   Medicare Annual Wellness Visit  03/15/2025   DTaP/Tdap/Td vaccine (6 - Td or Tdap) 04/23/2033   Pneumonia Vaccine  Completed   Hepatitis C Screening  Completed   Zoster (Shingles) Vaccine  Completed   HPV Vaccine  Aged Out   Meningitis B Vaccine  Aged Out   Colon Cancer Screening  Discontinued    Advanced directives: (Declined) Advance directive discussed with you today. Even though you declined this today, please call our office should you change your mind, and we can give you the proper paperwork for you to fill out.  Next Medicare Annual Wellness Visit scheduled for next year: Yes 03/17/25 @ 11:20

## 2024-03-15 NOTE — Progress Notes (Signed)
 Subjective:   Gabrielle [name] is a 78 y.o. who presents for a Medicare Wellness preventive visit.  Visit Complete: Virtual I connected with  Al Corpus on 03/15/24 by a audio enabled telemedicine application and verified that I am speaking with the correct person using two identifiers.  Patient Location: Home  Provider Location: Home Office  I discussed the limitations of evaluation and management by telemedicine. The patient expressed understanding and agreed to proceed.  Vital Signs: Because this visit was a virtual/telehealth visit, some criteria may be missing or patient reported. Any vitals not documented were not able to be obtained and vitals that have been documented are patient reported.  VideoDeclined- This patient declined Librarian, academic. Therefore the visit was completed with audio only.  Persons Participating in Visit: Patient.  AWV Questionnaire: No: Patient Medicare AWV questionnaire was not completed prior to this visit.  Cardiac Risk Factors include: advanced age (>40men, >70 women);dyslipidemia;hypertension;obesity (BMI >30kg/m2);Other (see comment), Risk factor comments: AFib     Objective:    Today's Vitals   03/15/24 1139  Weight: 170 lb (77.1 kg)  Height: 5\' 2"  (1.575 m)   Body mass index is 31.09 kg/m.     03/15/2024   11:53 AM 12/25/2023   12:56 PM 11/30/2023    1:45 PM 11/09/2023    2:31 PM 10/19/2023    1:43 PM 09/28/2023    9:26 AM 09/07/2023    1:49 PM  Advanced Directives  Does Patient Have a Medical Advance Directive? No Yes Yes Yes Yes Yes Yes  Type of Furniture conservator/restorer;Living will Healthcare Power of [place];Living will Living will;Healthcare Power of Attorney Living will;Healthcare Power of Attorney Living will Living will  Does patient want to make changes to medical advance directive?   No - Patient declined No - Patient declined No - Patient declined No - Patient  declined No - Patient declined  Copy of Healthcare Power of Attorney in Chart?  No - copy requested No - copy requested No - copy requested  No - copy requested   Would patient like information on creating a medical advance directive? No - Patient declined No - Patient declined No - Patient declined No - Patient declined No - Patient declined No - Patient declined No - Patient declined    Current Medications (verified) Outpatient Encounter Medications as of 03/15/2024  Medication Sig   alendronate (FOSAMAX) 70 MG tablet TAKE ONE TABLET EVERY 7 DAYS ON AN EMPTY STOMACH WITH A FULL GLASS OF WATER   amLODipine (NORVASC) 10 MG tablet Take 1 tablet (10 mg total) by mouth daily. For blood pressure   atorvastatin (LIPITOR) 20 MG tablet Take 1 tablet (20 mg total) by mouth daily. For cholesterol   cholecalciferol (VITAMIN D3) 25 MCG (1000 UT) tablet Take 1,000 Units by mouth daily.   CRANBERRY FRUIT PO Take 1 tablet by mouth daily.   diclofenac Sodium (VOLTAREN) 1 % GEL APPLY 4 GRAMS TO AFFECTED AREA(S) 4 TIMES A DAY (Patient taking differently: Apply 4 g topically 4 (four) times daily as needed (Pain).)   dorzolamide-timolol (COSOPT) 2-0.5 % ophthalmic solution INSTILL ONE DROP IN EACH EYE TWICE DAILY   ELIQUIS 5 MG TABS tablet Take 1 tablet (5 mg total) by mouth 2 (two) times daily.   Ferrous Sulfate (IRON) 325 (65 Fe) MG TABS Take 1 tablet (325 mg total) by mouth daily.   fluticasone (FLONASE) 50 MCG/ACT nasal spray PLACE 2 SPRAYS INTO  EACH NOSTRIL ONCE A DAY   guaiFENesin-codeine 100-10 MG/5ML syrup Take 5 mLs by mouth 3 (three) times daily as needed for cough.   hydrALAZINE (APRESOLINE) 25 MG tablet Take 1 tablet (25 mg total) by mouth 2 (two) times daily.   Multiple Vitamins-Minerals (CENTRUM ADULT PO) Take 1 tablet by mouth daily.   Potassium Chloride ER 20 MEQ TBCR Take 1 tablet (20 mEq total) by mouth daily. Take while taking hydrochlorothiazide/HCTZ   No facility-administered encounter  medications on file as of 03/15/2024.    Allergies (verified) Lisinopril   History: Past Medical History:  Diagnosis Date   Arthritis    Cancer (HCC)    Cataract    OU   Colon cancer (HCC)    Family history of pancreatic cancer 08/21/2020   Family history of uterine cancer 08/21/2020   H/O cesarean section    Hx of tonsillectomy    Hyperlipidemia    Hypertension    Kidney disease    Macular degeneration    Exu ARMD OU   Paroxysmal atrial fibrillation (HCC) 07/05/2020   Stroke (cerebrum) (HCC) 07/05/2020   Urinary retention 04/15/2020   Past Surgical History:  Procedure Laterality Date   BIOPSY  04/06/2020   Procedure: BIOPSY;  Surgeon: Malissa Hippo, MD;  [facility];  Service: Endoscopy;;   CARPAL TUNNEL RELEASE Right    CATARACT EXTRACTION W/PHACO Left 10/11/2019   Procedure: CATARACT EXTRACTION PHACO AND INTRAOCULAR LENS PLACEMENT (IOC);  Surgeon: Fabio Pierce, MD;  Location: [facility];  Service: Ophthalmology;  Laterality: Left;  CDE: 8.56   CATARACT EXTRACTION W/PHACO Right 10/25/2019   Procedure: CATARACT EXTRACTION PHACO AND INTRAOCULAR LENS PLACEMENT (IOC);  Surgeon: Fabio Pierce, MD;  Location: [facility];  Service: Ophthalmology;  Laterality: Right;  CDE: 5.67   CESAREAN SECTION     COLONOSCOPY N/A 04/06/2020   Procedure: COLONOSCOPY;  Surgeon: Malissa Hippo, MD;  [facility];  Service: Endoscopy;  Laterality: N/A;   CYSTOSCOPY WITH BIOPSY N/A 04/08/2020   Procedure: CYSTOSCOPY WITH BIOPSY;  Surgeon: Malen Gauze, MD;  Location: [facility];  Service: Urology;  Laterality: N/A;   ESOPHAGOGASTRODUODENOSCOPY N/A 04/05/2020   Procedure: ESOPHAGOGASTRODUODENOSCOPY (EGD);  Surgeon: Malissa Hippo, MD;  [facility];  Service: Endoscopy;  Laterality: N/A;   PARTIAL COLECTOMY N/A 04/08/2020   Procedure: PARTIAL COLECTOMY;  Surgeon: Lucretia Roers, MD;  Location: [facility];  Service: General;  Laterality: N/A;   PORTACATH PLACEMENT Left  05/18/2020   Procedure: INSERTION PORT-A-CATH (ATTACHED CATHETER IN LEFT SUBCLAVIAN);  Surgeon: Lucretia Roers, MD;  Location: [facility];  Service: General;  Laterality: Left;   TONSILLECTOMY     Family History  Problem Relation Age of Onset   Macular degeneration Mother    Stroke Mother    Hypertension Mother    Dementia Mother    Lung cancer Father        dx late 6s; smoking hx   Diabetes Sister    Hypertension Sister    Leukemia Paternal Uncle        d. 88s   Cancer Maternal Aunt        ovarian or endometrial dx 22s   Pancreatic cancer Paternal Uncle        d. late 19s   Social History   Socioeconomic History   Marital status: Divorced    Spouse name: Not on file   Number of children: Not on file   Years of education: Not on  file   Highest education level: Not on file  Occupational History   Not on file  Tobacco Use   Smoking status: Never   Smokeless tobacco: Never  Vaping Use   Vaping status: Never Used  Substance and Sexual Activity   Alcohol use: No   Drug use: No   Sexual activity: Not Currently  Other Topics Concern   Not on file  Social History Narrative   Not on file   Social Drivers of Health   Financial Resource Strain: Low Risk  (03/15/2024)   Overall Financial Resource Strain (CARDIA)    Difficulty of Paying Living Expenses: Not hard at all  Food Insecurity: No Food Insecurity (03/15/2024)   Hunger Vital Sign    Worried About Running Out of Food in the Last Year: Never true    Ran Out of Food in the Last Year: Never true  Transportation Needs: No Transportation Needs (03/15/2024)   PRAPARE - Administrator, Civil Service (Medical): No    Lack of Transportation (Non-Medical): No  Physical Activity: Sufficiently Active (03/15/2024)   Exercise Vital Sign    Days of Exercise per Week: 7 days    Minutes of Exercise per Session: 30 min  Stress: No Stress Concern Present (03/15/2024)   [employer] of Occupational Health -  Occupational Stress Questionnaire    Feeling of Stress : Not at all  Social Connections: Moderately Isolated (03/15/2024)   Social Connection and Isolation Panel [NHANES]    Frequency of Communication with Friends and Family: More than three times a week    Frequency of Social Gatherings with Friends and Family: More than three times a week    Attends Religious Services: More than 4 times per year    Active Member of [employer] or Organizations: No    Attends Banker Meetings: Never    Marital Status: Widowed    Tobacco Counseling Counseling given: Not Answered    Clinical Intake:  Pre-visit preparation completed: Yes  Pain : No/denies pain     BMI - recorded: 31.09 Nutritional Status: BMI > 30  Obese Nutritional Risks: None Diabetes: No  Lab Results  Component Value Date   HGBA1C 5.0 07/05/2020   HGBA1C 6.0 01/03/2018     How often do you need to have someone help you when you read instructions, pamphlets, or other written materials from your doctor or pharmacy?: 1 - Never  Interpreter Needed?: No  Information entered by :: R. Chrysten Woulfe LPN   Activities of Daily Living     03/15/2024   11:41 AM 07/28/2023    2:00 PM  In your present state of health, do you have any difficulty performing the following activities:  Hearing? 1 0  Comment wears aids   Vision? 0 0  Difficulty concentrating or making decisions? 0 0  Walking or climbing stairs? 0 0  Dressing or bathing? 0 0  Doing errands, shopping? 1 0  Preparing Food and eating ? N   Using the Toilet? N   In the past six months, have you accidently leaked urine? N   Do you have problems with loss of bowel control? N   Managing your Medications? N   Managing your Finances? N   Housekeeping or managing your Housekeeping? N     Patient Care Team: [name], DO as PCP - General (Family Medicine) [name], MD as Consulting Physician (Retina Ophthalmology) Fabio Pierce, MD as Consulting  Physician (Ophthalmology) West Bali, MD (  Inactive) as Consulting Physician (Gastroenterology) Doreatha Massed, MD as Medical Oncologist (Oncology)  Indicate any recent Medical Services you may have received from other than [facility] providers in the past year (date may be approximate).     Assessment:   This is a routine wellness examination for Ora.  Hearing/Vision screen Hearing Screening - Comments:: Wears aids Vision Screening - Comments:: glasses   Goals Addressed             This Visit's Progress    Patient Stated       Continue to go for her appointment and walk daily       Depression Screen     03/15/2024   11:49 AM 08/02/2023    8:41 AM 07/24/2023    3:14 PM 06/26/2023   10:01 AM 04/24/2023   10:09 AM 04/18/2023   11:34 AM 03/30/2023    3:14 PM  PHQ 2/9 Scores  PHQ - 2 Score 0  0 0 0 0 0  PHQ- 9 Score 0  0 0 0 0 0  Exception Documentation  Patient refusal         Fall Risk     03/15/2024   11:44 AM 07/24/2023    3:14 PM 06/26/2023   10:01 AM 04/24/2023   10:09 AM 04/18/2023   11:34 AM  Fall Risk   Falls in the past year? 1 0 0 0 0  Number falls in past yr: 0 0  0   Injury with Fall? 0 0  0   Risk for fall due to : History of fall(s);Impaired balance/gait No Fall Risks  No Fall Risks   Follow up Falls evaluation completed;Falls prevention discussed Education provided  Education provided     MEDICARE RISK AT HOME:  Medicare Risk at Home Any stairs in or around the home?: Yes If so, are there any without handrails?: No Home free of loose throw rugs in walkways, pet beds, electrical cords, etc?: Yes Adequate lighting in your home to reduce risk of falls?: Yes Life alert?: No Use of a cane, walker or w/c?: No Grab bars in the bathroom?: Yes Shower chair or bench in shower?: No Elevated toilet seat or a handicapped toilet?: No  TIMED UP AND GO:  Was the test performed?  No  Cognitive Function: 6CIT completed        03/15/2024   11:54 AM  03/15/2023    8:54 AM 02/21/2022   11:26 AM  6CIT Screen  What Year? 0 points 0 points 0 points  What month? 0 points 0 points 0 points  What time? 0 points 0 points 0 points  Count back from 20 0 points 0 points 0 points  Months in reverse 0 points 0 points 2 points  Repeat phrase 0 points 0 points 0 points  Total Score 0 points 0 points 2 points    Immunizations Immunization History  Administered Date(s) Administered   Fluad Quad(high Dose 65+) 08/26/2020, 11/16/2021, 09/06/2022   Influenza Split 01/25/2018   Influenza, High Dose Seasonal PF 11/19/2019   Influenza, Seasonal, Injecte, Preservative Fre 11/19/2019   Influenza,inj,Quad PF,6+ Mos 01/25/2018   Moderna Sars-Covid-2 Vaccination 01/14/2020, 02/11/2020, 10/06/2020   Pneumococcal Conjugate-13 01/25/2018   Pneumococcal Polysaccharide-23 06/24/2019   Td 04/12/2010   Td (Adult), 2 Lf Tetanus Toxid, Preservative Free 04/12/2010   Td (Adult),5 Lf Tetanus Toxid, Preservative Free 04/12/2010   Tdap 04/12/2010, 04/24/2023   Zoster Recombinant(Shingrix) 11/19/2019, 11/19/2019, 03/20/2020, 03/20/2020    Screening Tests Health Maintenance  Topic  Date Due   FOOT EXAM  Never done   OPHTHALMOLOGY EXAM  Never done   Diabetic kidney evaluation - Urine ACR  Never done   HEMOGLOBIN A1C  01/05/2021   DEXA SCAN  07/23/2023   COVID-19 Vaccine (4 - 2024-25 season) 08/06/2023   INFLUENZA VACCINE  07/05/2024   Diabetic kidney evaluation - eGFR measurement  12/24/2024   Medicare Annual Wellness (AWV)  03/15/2025   DTaP/Tdap/Td (6 - Td or Tdap) 04/23/2033   Pneumonia Vaccine 37+ Years old  Completed   Hepatitis C Screening  Completed   Zoster Vaccines- Shingrix  Completed   HPV VACCINES  Aged Out   Meningococcal B Vaccine  Aged Out   Colonoscopy  Discontinued    Health Maintenance  Health Maintenance Due  Topic Date Due   FOOT EXAM  Never done   OPHTHALMOLOGY EXAM  Never done   Diabetic kidney evaluation - Urine ACR  Never done    HEMOGLOBIN A1C  01/05/2021   DEXA SCAN  07/23/2023   COVID-19 Vaccine (4 - 2024-25 season) 08/06/2023   Health Maintenance Items Addressed: Patient advised that she needs to update her flu shot annually. Patient declines mammogram and Dexa.  Additional Screening:  Vision Screening: Recommended annual ophthalmology exams for early detection of glaucoma and other disorders of the eye. Up to date [facility]  Dental Screening: Recommended annual dental exams for proper oral hygiene  Community Resource Referral / Chronic Care Management: CRR required this visit?  No   CCM required this visit?  No     Plan:     I have personally reviewed and noted the following in the patient's chart:   Medical and social history Use of alcohol, tobacco or illicit drugs  Current medications and supplements including opioid prescriptions. Patient is not currently taking opioid prescriptions. Functional ability and status Nutritional status Physical activity Advanced directives List of other physicians Hospitalizations, surgeries, and ER visits in previous 12 months Vitals Screenings to include cognitive, depression, and falls Referrals and appointments  In addition, I have reviewed and discussed with patient certain preventive protocols, quality metrics, and best practice recommendations. A written personalized care plan for preventive services as well as general preventive health recommendations were provided to patient.     Sydell Axon, LPN   1/61/0960   After Visit Summary: (MyChart) Due to this being a telephonic visit, the after visit summary with patients personalized plan was offered to patient via MyChart   Notes: Please refer to Routing Comments.

## 2024-03-16 ENCOUNTER — Other Ambulatory Visit: Payer: Self-pay

## 2024-03-21 ENCOUNTER — Other Ambulatory Visit: Payer: Self-pay

## 2024-03-21 DIAGNOSIS — C189 Malignant neoplasm of colon, unspecified: Secondary | ICD-10-CM

## 2024-03-21 DIAGNOSIS — E875 Hyperkalemia: Secondary | ICD-10-CM

## 2024-03-21 DIAGNOSIS — D508 Other iron deficiency anemias: Secondary | ICD-10-CM

## 2024-03-21 DIAGNOSIS — Z79899 Other long term (current) drug therapy: Secondary | ICD-10-CM

## 2024-03-25 ENCOUNTER — Ambulatory Visit (HOSPITAL_COMMUNITY)
Admission: RE | Admit: 2024-03-25 | Discharge: 2024-03-25 | Disposition: A | Discharge status: Home / Self Care | Payer: Medicare HMO | Source: Ambulatory Visit | Attending: Hematology | Admitting: Hematology

## 2024-03-25 ENCOUNTER — Inpatient Hospital Stay: Payer: Medicare HMO | Attending: Hematology

## 2024-03-25 DIAGNOSIS — R161 Splenomegaly, not elsewhere classified: Secondary | ICD-10-CM | POA: Insufficient documentation

## 2024-03-25 DIAGNOSIS — Z79899 Other long term (current) drug therapy: Secondary | ICD-10-CM

## 2024-03-25 DIAGNOSIS — I700 Atherosclerosis of aorta: Secondary | ICD-10-CM | POA: Diagnosis not present

## 2024-03-25 DIAGNOSIS — C189 Malignant neoplasm of colon, unspecified: Secondary | ICD-10-CM

## 2024-03-25 DIAGNOSIS — D7389 Other diseases of spleen: Secondary | ICD-10-CM | POA: Diagnosis not present

## 2024-03-25 DIAGNOSIS — Z7901 Long term (current) use of anticoagulants: Secondary | ICD-10-CM | POA: Insufficient documentation

## 2024-03-25 DIAGNOSIS — D508 Other iron deficiency anemias: Secondary | ICD-10-CM | POA: Insufficient documentation

## 2024-03-25 DIAGNOSIS — E875 Hyperkalemia: Secondary | ICD-10-CM | POA: Insufficient documentation

## 2024-03-25 DIAGNOSIS — I480 Paroxysmal atrial fibrillation: Secondary | ICD-10-CM | POA: Insufficient documentation

## 2024-03-25 DIAGNOSIS — C19 Malignant neoplasm of rectosigmoid junction: Secondary | ICD-10-CM | POA: Diagnosis not present

## 2024-03-25 DIAGNOSIS — D696 Thrombocytopenia, unspecified: Secondary | ICD-10-CM | POA: Insufficient documentation

## 2024-03-25 DIAGNOSIS — C182 Malignant neoplasm of ascending colon: Secondary | ICD-10-CM | POA: Insufficient documentation

## 2024-03-25 DIAGNOSIS — Z800 Family history of malignant neoplasm of digestive organs: Secondary | ICD-10-CM | POA: Insufficient documentation

## 2024-03-25 DIAGNOSIS — I10 Essential (primary) hypertension: Secondary | ICD-10-CM | POA: Insufficient documentation

## 2024-03-25 DIAGNOSIS — R918 Other nonspecific abnormal finding of lung field: Secondary | ICD-10-CM | POA: Insufficient documentation

## 2024-03-25 DIAGNOSIS — Z808 Family history of malignant neoplasm of other organs or systems: Secondary | ICD-10-CM | POA: Insufficient documentation

## 2024-03-25 DIAGNOSIS — Z801 Family history of malignant neoplasm of trachea, bronchus and lung: Secondary | ICD-10-CM | POA: Insufficient documentation

## 2024-03-25 LAB — CBC WITH DIFFERENTIAL/PLATELET
Abs Immature Granulocytes: 0.02 10*3/uL (ref 0.00–0.07)
Basophils Absolute: 0 10*3/uL (ref 0.0–0.1)
Basophils Relative: 0 %
Eosinophils Absolute: 0.2 10*3/uL (ref 0.0–0.5)
Eosinophils Relative: 3 %
HCT: 37.2 % (ref 36.0–46.0)
Hemoglobin: 12.3 g/dL (ref 12.0–15.0)
Immature Granulocytes: 0 %
Lymphocytes Relative: 15 %
Lymphs Abs: 1.1 10*3/uL (ref 0.7–4.0)
MCH: 30.4 pg (ref 26.0–34.0)
MCHC: 33.1 g/dL (ref 30.0–36.0)
MCV: 91.9 fL (ref 80.0–100.0)
Monocytes Absolute: 0.5 10*3/uL (ref 0.1–1.0)
Monocytes Relative: 8 %
Neutro Abs: 5.3 10*3/uL (ref 1.7–7.7)
Neutrophils Relative %: 74 %
Platelets: 143 10*3/uL — ABNORMAL LOW (ref 150–400)
RBC: 4.05 MIL/uL (ref 3.87–5.11)
RDW: 14 % (ref 11.5–15.5)
WBC: 7.1 10*3/uL (ref 4.0–10.5)
nRBC: 0 % (ref 0.0–0.2)

## 2024-03-25 LAB — COMPREHENSIVE METABOLIC PANEL WITH GFR
ALT: 16 U/L (ref 0–44)
AST: 22 U/L (ref 15–41)
Albumin: 4 g/dL (ref 3.5–5.0)
Alkaline Phosphatase: 88 U/L (ref 38–126)
Anion gap: 10 (ref 5–15)
BUN: 20 mg/dL (ref 8–23)
CO2: 28 mmol/L (ref 22–32)
Calcium: 9.3 mg/dL (ref 8.9–10.3)
Chloride: 101 mmol/L (ref 98–111)
Creatinine, Ser: 1.05 mg/dL — ABNORMAL HIGH (ref 0.44–1.00)
GFR, Estimated: 55 mL/min — ABNORMAL LOW (ref >60)
Glucose, Bld: 115 mg/dL — ABNORMAL HIGH (ref 70–99)
Potassium: 3.5 mmol/L (ref 3.5–5.1)
Sodium: 139 mmol/L (ref 135–145)
Total Bilirubin: 0.7 mg/dL (ref 0.0–1.2)
Total Protein: 7.7 g/dL (ref 6.5–8.1)

## 2024-03-25 LAB — TSH: TSH: 1.83 u[IU]/mL (ref 0.350–4.500)

## 2024-03-25 MED ORDER — IOHEXOL 300 MG/ML  SOLN
100.0000 mL | Freq: Once | INTRAMUSCULAR | Status: AC | PRN
  Administered 2024-03-25: 100 mL via INTRAVENOUS

## 2024-03-26 LAB — CEA: CEA: 3.6 ng/mL (ref 0.0–4.7)

## 2024-03-26 NOTE — Progress Notes (Signed)
 [facility] - Clinic Note  03/27/2024    CHIEF COMPLAINT Patient presents for Retina Follow Up  HISTORY OF PRESENT ILLNESS: Gabrielle [name] is a 78 y.o. female who presents to the clinic today for:   HPI     Retina Follow Up   Patient presents with  Wet AMD.  In both eyes.  Severity is moderate.  Since onset it is gradually worsening.  I, the attending physician,  performed the HPI with the patient and updated documentation appropriately.        Comments   Pt feels like vision has gotten worse over the past couple of days, eyes have been watering, drops seem to help      Last edited by Ronelle Coffee, MD on 03/27/2024 12:49 PM.     Patient states her vision has been blurry, her eyes have been watering and itchy  Referring physician: Eliodoro Guerin, DO [address] [place],  [place] [address]  HISTORICAL INFORMATION:   Selected notes from the MEDICAL RECORD NUMBER Referred by Dr. Patriciaann Boone for concern of ARMD OU   CURRENT MEDICATIONS: Current Outpatient Medications (Ophthalmic Drugs)  Medication Sig   dorzolamide -timolol  (COSOPT ) 2-0.5 % ophthalmic solution INSTILL ONE DROP IN THE RIGHT EYE TWICE DAILY   No current facility-administered medications for this visit. (Ophthalmic Drugs)   Current Outpatient Medications (Other)  Medication Sig   alendronate  (FOSAMAX ) 70 MG tablet TAKE ONE TABLET EVERY 7 DAYS ON AN EMPTY STOMACH WITH A FULL GLASS OF WATER    amLODipine  (NORVASC ) 10 MG tablet Take 1 tablet (10 mg total) by mouth daily. For blood pressure   atorvastatin  (LIPITOR) 20 MG tablet Take 1 tablet (20 mg total) by mouth daily. For cholesterol   cholecalciferol  (VITAMIN D3) 25 MCG (1000 UT) tablet Take 1,000 Units by mouth daily.   CRANBERRY FRUIT PO Take 1 tablet by mouth daily.   diclofenac  Sodium (VOLTAREN ) 1 % GEL APPLY 4 GRAMS TO AFFECTED AREA(S) 4 TIMES A DAY (Patient taking differently: Apply 4 g topically 4 (four) times daily as needed  (Pain).)   ELIQUIS  5 MG TABS tablet Take 1 tablet (5 mg total) by mouth 2 (two) times daily.   Ferrous Sulfate  (IRON ) 325 (65 Fe) MG TABS Take 1 tablet (325 mg total) by mouth daily.   fluticasone  (FLONASE ) 50 MCG/ACT nasal spray PLACE 2 SPRAYS INTO EACH NOSTRIL ONCE A DAY   guaiFENesin -codeine  100-10 MG/5ML syrup Take 5 mLs by mouth 3 (three) times daily as needed for cough.   hydrALAZINE  (APRESOLINE ) 25 MG tablet Take 1 tablet (25 mg total) by mouth 2 (two) times daily.   Multiple Vitamins-Minerals (CENTRUM ADULT PO) Take 1 tablet by mouth daily.   Potassium Chloride  ER 20 MEQ TBCR Take 1 tablet (20 mEq total) by mouth daily. Take while taking hydrochlorothiazide /HCTZ   No current facility-administered medications for this visit. (Other)   REVIEW OF SYSTEMS: ROS   Negative for: Cardiovascular, Eyes Last edited by Jocelyn Mule, COT on 03/27/2024  8:06 AM.      ALLERGIES Allergies  Allergen Reactions   Lisinopril  Cough   PAST MEDICAL HISTORY Past Medical History:  Diagnosis Date   Arthritis    Cancer [facility])    Cataract    OU   Colon cancer (HCC)    Family history of pancreatic cancer 08/21/2020   Family history of uterine cancer 08/21/2020   H/O cesarean section    Hx of tonsillectomy    Hyperlipidemia  Hypertension    Kidney disease    Macular degeneration    Exu ARMD OU   Paroxysmal atrial fibrillation (HCC) 07/05/2020   Stroke (cerebrum) (HCC) 07/05/2020   Urinary retention 04/15/2020   Past Surgical History:  Procedure Laterality Date   BIOPSY  04/06/2020   Procedure: BIOPSY;  Surgeon: Ruby Corporal, MD;  Location: [facility];  Service: Endoscopy;;   CARPAL TUNNEL RELEASE Right    CATARACT EXTRACTION W/PHACO Left 10/11/2019   Procedure: CATARACT EXTRACTION PHACO AND INTRAOCULAR LENS PLACEMENT (IOC);  Surgeon: Tarri Farm, MD;  Location: [facility] OR[facility];  Service: Ophthalmology;  Laterality: Left;  CDE: 8.56   CATARACT EXTRACTION W/PHACO Right 10/25/2019    Procedure: CATARACT EXTRACTION PHACO AND INTRAOCULAR LENS PLACEMENT (IOC);  Surgeon: Tarri Farm, MD;  Location: [facility];  Service: Ophthalmology;  Laterality: Right;  CDE: 5.67   CESAREAN SECTION     COLONOSCOPY N/A 04/06/2020   Procedure: COLONOSCOPY;  Surgeon: Ruby Corporal, MD;  Location: [facility];  Service: Endoscopy;  Laterality: N/A;   CYSTOSCOPY WITH BIOPSY N/A 04/08/2020   Procedure: CYSTOSCOPY WITH BIOPSY;  Surgeon: Marco Severs, MD;  Location: [facility];  Service: Urology;  Laterality: N/A;   ESOPHAGOGASTRODUODENOSCOPY N/A 04/05/2020   Procedure: ESOPHAGOGASTRODUODENOSCOPY (EGD);  Surgeon: Ruby Corporal, MD;  [facility];  Service: Endoscopy;  Laterality: N/A;   PARTIAL COLECTOMY N/A 04/08/2020   Procedure: PARTIAL COLECTOMY;  Surgeon: Awilda Bogus, MD;  Location: [facility];  Service: General;  Laterality: N/A;   PORTACATH PLACEMENT Left 05/18/2020   Procedure: INSERTION PORT-A-CATH (ATTACHED CATHETER IN LEFT SUBCLAVIAN);  Surgeon: Awilda Bogus, MD;  Location: [facility];  Service: General;  Laterality: Left;   TONSILLECTOMY     FAMILY HISTORY Family History  Problem Relation Age of Onset   Macular degeneration Mother    Stroke Mother    Hypertension Mother    Dementia Mother    Lung cancer Father        dx late 11s; smoking hx   Diabetes Sister    Hypertension Sister    Leukemia Paternal Uncle        d. 42s   Cancer Maternal Aunt        ovarian or endometrial dx 3s   Pancreatic cancer Paternal Uncle        d. late 73s   SOCIAL HISTORY Social History   Tobacco Use   Smoking status: Never   Smokeless tobacco: Never  Vaping Use   Vaping status: Never Used  Substance Use Topics   Alcohol use: No   Drug use: No       OPHTHALMIC EXAM: Base Eye Exam     Visual Acuity (Snellen - Linear)       Right Left   Dist cc CF at 3' 20/70 -2   Dist ph cc  20/70 +1    Correction: Glasses         Tonometry (Tonopen, 8:11 AM)       Right  Left   Pressure 20 16         Visual Fields (Counting fingers)       Left Right    Full Full         Extraocular Movement       Right Left    Full, Ortho Full, Ortho         Neuro/Psych     Oriented x3: Yes   Mood/Affect: Normal  Dilation     Both eyes: 1.0% Mydriacyl @ 8:12 AM           Slit Lamp and Fundus Exam     Slit Lamp Exam       Right Left   Lids/Lashes Dermatochalasis - upper lid Dermatochalasis - upper lid   Conjunctiva/Sclera 1+ Injection Trace Injection, +mucus   Cornea Arcus, 3+ fine Punctpate epithelial erosions Arcus, trace Punctate epithelial erosions, mild tear film debris, trace endo pigment, high tear lake   Anterior Chamber Deep and quiet Deep and quiet   Iris Round and well dilated Round and well dilated   Lens PCIOL in good position, 1+ Posterior capsular opacification PC IOL in good position, trace PCO nasally   Anterior Vitreous Vitreous syneresis, Posterior vitreous detachment, vitreous condensations Vitreous syneresis, Posterior vitreous detachment, silicone oil micro drops         Fundus Exam       Right Left   Disc pink and sharp, compact mild pallor, sharp rim, Compact, vascular loops superiorly   C/D Ratio 0.2 0.2   Macula Blunted foveal reflex, large central GA, Drusen, Pigment clumping and atrophy, central thickening/pigmented disciform scar, +PED, punctate IRH superior macula within atrophy -- stably resolved, persistent trace cystic changes overlying central scar Blunted foveal reflex, central thickening, RPE clumping and atrophy, Drusen, trace cystic changes overlying PED -- persistent, no heme   Vessels Vascular attenuation, Tortuous Vascular attenuation, Tortuous   Periphery Attached, mild Reticular degeneration Attached, mild Reticular degeneration           IMAGING AND PROCEDURES  Imaging and Procedures for 03/21/18  OCT, Retina - OU - Both Eyes       Right Eye Quality was good. Central Foveal  Thickness: 507. Progression has been stable. Findings include no SRF, abnormal foveal contour, retinal drusen , outer retinal tubulation, subretinal hyper-reflective material, disciform scar, epiretinal membrane, intraretinal fluid, pigment epithelial detachment, outer retinal atrophy (Persistent cystic changes overlying stable central SRHM / disciform scar).   Left Eye Quality was good. Central Foveal Thickness: 277. Progression has improved. Findings include no SRF, abnormal foveal contour, retinal drusen , subretinal hyper-reflective material, intraretinal fluid, pigment epithelial detachment, outer retinal atrophy (Trace persistent cystic changes nasal fovea and macula overlying PED/SRHM -- slightly improved).   Notes *Images captured and stored on drive  Diagnosis / Impression:  Exudative ARMD OU OD: persistent cystic changes overlying stable central SRHM / disciform scar  OS: trace persistent cystic changes nasal fovea and macula overlying PED/SRHM -- slightly improved  Clinical management:  See below  Abbreviations: NFP - Normal foveal profile. CME - cystoid macular edema. PED - pigment epithelial detachment. IRF - intraretinal fluid. SRF - subretinal fluid. EZ - ellipsoid zone. ERM - epiretinal membrane. ORA - outer retinal atrophy. ORT - outer retinal tubulation. SRHM - subretinal hyper-reflective material      Intravitreal Injection, Pharmacologic Agent - OS - Left Eye       Time Out 03/27/2024. 9:01 AM. Confirmed correct patient, procedure, site, and patient consented.   Anesthesia Topical anesthesia was used. Anesthetic medications included Lidocaine  2%, Proparacaine 0.5%.   Procedure Preparation included 5% betadine  to ocular surface, eyelid speculum. A supplied (32g) needle was used.   Injection: 6 mg faricimab -svoa 6 MG/0.05ML   Route: Intravitreal, Site: Left Eye   NDC: 16109-604-54, Lot: [id number], Expiration date: 04/02/2025, Waste: 0 mL   Post-op Post injection  exam found visual acuity of at least counting fingers. The patient tolerated the procedure  well. There were no complications. The patient received written and verbal post procedure care education. Post injection medications were not given.            ASSESSMENT/PLAN:    ICD-10-CM   1. Exudative age-related macular degeneration of both eyes with active choroidal neovascularization (HCC)  H35.3231 OCT, Retina - OU - Both Eyes    Intravitreal Injection, Pharmacologic Agent - OS - Left Eye    faricimab -svoa (VABYSMO ) 6mg /0.51mL intravitreal injection    2. Posterior vitreous detachment of both eyes  H43.813     3. Essential hypertension  I10     4. Hypertensive retinopathy of both eyes  H35.033     5. Pseudophakia of both eyes  Z96.1     6. Ocular hypertension, bilateral  H40.053     7. Dry eyes  H04.123      1. Exudative age-related macular degeneration, both eyes - severe exudative disease with very active CNVM OU at presentation in January 2019  - S/P IVA OD #1 (01.04.19), #2 (02.15.19)  - S/P IVA OS #1 (01.18.19), #2 (02.15.19)  - switched to Eylea  3.18.19 due to severity of disease  =============================================== - S/P IVE OD #1 (03.18.19), #2 (04.17.19), #3 (05.15.19), #4 (10.02.19),  #5 (01.15.20), #6 (06.04.20), #7 (10.29.20), #8 (01.21.21), #9 (05.27.21), #10 (07.22.21) -- injections held due to stable disciform scar, #11 (08.12.24) for new IRH, #12 (09.25.24), #13 (11.06.24) - S/P IVE OS #1 (03.18.19), #2 (04.17.19), #3 (05.15.19), #4 (06.12.19), #5 (07.10.19), #6 (08.07.19), #7 (09.04.19), #8 (10.02.19), #9 (11.06.19), #10 (12.11.19), #11 (01.15.20), #12 (02.20.20), #13 (03.26.20), #14 (04.30.20), #15 (06.04.20), #16 (07.16.20), # 17 (08.20.20), #18 (09.24.20), #19 (10.29.20), #20 (12.03.20), #21 (01.21.21), #22 (03.18.21), #23 (05.27.21), #24 (07.22.21), #25 (11.11.21), #26 (12.09.21), #27 (01.06.22), #28 (2.7.22), #29 (03.24.22), #30 (4.28.22), #31  (05.31.22), #32 (06.30.22), #33 (08.04.22), #34 (09.15.22), #35 (10.27.22), #36 (12.08.22), #37 (01.12.23), #38 (02.16.23), #39 (03.30.23), #40 (05.11.23), #41 (06.22.23), #42 (07.27.23), #43 (09.01.23), #44 (10.06.23), #45 (11.10.23), #46 (12.15.23) -- IVE resistance ========================================================================================================================================================  - s/p IVV OS #1 (01.12.24 -- sample), #2 (02.09.24), #3 (03.15.24), #4 (04.19.24), #5 (05.24.24), #6 (07.01.24), #7 (08.12.24), #8 (09.25.24), #9 (11.06.24), #10 (12.18.24), #11 (01.29.25), #12 (03.12.25)  - IVE OS held from 7.22.21 to 11.11.21 due to TIA/stroke on 8.1.21 - OCT OD: persistent cystic changes overlying stable central SRHM / disciform scar; OS: trace persistent cystic changes nasal fovea and macula overlying PED/SRHM -- slightly improved at 6 weeks  - exam OD with focal IRH superior macula within atrophy -- improved  - BCVA OD stable at CF 3'; OS 20/70 from 20/60 (dry eyes)  - recommend IVV OS #13 today, 04.23.25 w/ f/u at 6 wks again - IVV authorization obtained and Good Days reinstated - will hold IVE OD today -- pt in agreement - RBA of procedure discussed, questions answered - IVV informed consent obtained and signed on 01.29.25 - see procedure note   - [employer] funding reinstated for Ms. Gorter as of 03.12.25 visit - f/u in 6 weeks, sooner prn for DFE/OCT/possible injection(s)  2. PVD / vitreous syneresis  - Discussed findings and prognosis   - No RT or RD on 360 exam  - Reviewed s/s of RT/RD  - strict return precautions for any such RT/RD symptoms  3,4. Hypertensive retinopathy OU - exam shows persistent focal IRH OD within GA - discussed importance of tight BP control - monitor  5. Pseudophakia OU  - s/p CE/IOL (Dr. Adora Hoover, 11.2020)  - beautiful surgeries w/  IOLs in excellent position, doing well  - monitor  6. Ocular Hypertension OU  - IOP  13,16  - continue cosopt  bid OU  7. Dry eyes OU - recommend artificial tears and lubricating ointment as needed  Ophthalmic Meds Ordered this visit:  Meds ordered this encounter  Medications   faricimab -svoa (VABYSMO ) 6mg /0.7mL intravitreal injection     Return in about 6 weeks (around 05/08/2024) for f/u exu ARMD OU, DFE, OCT, Possible Injxn.  There are no Patient Instructions on file for this visit.  This document serves as a record of services personally performed by Jeanice Millard, MD, PhD. It was created on their behalf by Morley Arabia. Bevin Bucks, OA an ophthalmic technician. The creation of this record is the provider's dictation and/or activities during the visit.    Electronically signed by: Morley Arabia. Bevin Bucks, OA 03/27/24 12:50 PM  Jeanice Millard, M.D., Ph.D. Diseases & Surgery of the Retina and Vitreous [facility] 03/27/2024   I have reviewed the above documentation for accuracy and completeness, and I agree with the above. Jeanice Millard, M.D., Ph.D. 03/27/24 12:52 PM   Abbreviations: M myopia (nearsighted); A astigmatism; H hyperopia (farsighted); P presbyopia; Mrx spectacle prescription;  CTL contact lenses; OD right eye; OS left eye; OU both eyes  XT exotropia; ET esotropia; PEK punctate epithelial keratitis; PEE punctate epithelial erosions; DES dry eye syndrome; MGD meibomian gland dysfunction; ATs artificial tears; PFAT's preservative free artificial tears; NSC nuclear sclerotic cataract; PSC posterior subcapsular cataract; ERM epi-retinal membrane; PVD posterior vitreous detachment; RD retinal detachment; DM diabetes mellitus; DR diabetic retinopathy; NPDR non-proliferative diabetic retinopathy; PDR proliferative diabetic retinopathy; CSME clinically significant macular edema; DME diabetic macular edema; dbh dot blot hemorrhages; CWS cotton wool spot; POAG primary open angle glaucoma; C/D cup-to-disc ratio; HVF humphrey visual field; GVF goldmann visual  field; OCT optical coherence tomography; IOP intraocular pressure; BRVO Branch retinal vein occlusion; CRVO central retinal vein occlusion; CRAO central retinal artery occlusion; BRAO branch retinal artery occlusion; RT retinal tear; SB scleral buckle; PPV pars plana vitrectomy; VH Vitreous hemorrhage; PRP panretinal laser photocoagulation; IVK intravitreal kenalog; VMT vitreomacular traction; MH Macular hole;  NVD neovascularization of the disc; NVE neovascularization elsewhere; AREDS age related eye disease study; ARMD age related macular degeneration; POAG primary open angle glaucoma; EBMD epithelial/anterior basement membrane dystrophy; ACIOL anterior chamber intraocular lens; IOL intraocular lens; PCIOL posterior chamber intraocular lens; Phaco/IOL phacoemulsification with intraocular lens placement; PRK photorefractive keratectomy; LASIK laser assisted in situ keratomileusis; HTN hypertension; DM diabetes mellitus; COPD chronic obstructive pulmonary disease

## 2024-03-27 ENCOUNTER — Ambulatory Visit (INDEPENDENT_AMBULATORY_CARE_PROVIDER_SITE_OTHER): Admitting: Ophthalmology

## 2024-03-27 ENCOUNTER — Encounter (INDEPENDENT_AMBULATORY_CARE_PROVIDER_SITE_OTHER): Payer: Self-pay | Admitting: Ophthalmology

## 2024-03-27 DIAGNOSIS — I10 Essential (primary) hypertension: Secondary | ICD-10-CM | POA: Diagnosis not present

## 2024-03-27 DIAGNOSIS — H43813 Vitreous degeneration, bilateral: Secondary | ICD-10-CM | POA: Diagnosis not present

## 2024-03-27 DIAGNOSIS — H353231 Exudative age-related macular degeneration, bilateral, with active choroidal neovascularization: Secondary | ICD-10-CM

## 2024-03-27 DIAGNOSIS — H35033 Hypertensive retinopathy, bilateral: Secondary | ICD-10-CM

## 2024-03-27 DIAGNOSIS — Z961 Presence of intraocular lens: Secondary | ICD-10-CM | POA: Diagnosis not present

## 2024-03-27 DIAGNOSIS — H40053 Ocular hypertension, bilateral: Secondary | ICD-10-CM | POA: Diagnosis not present

## 2024-03-27 DIAGNOSIS — H04123 Dry eye syndrome of bilateral lacrimal glands: Secondary | ICD-10-CM

## 2024-03-27 MED ORDER — FARICIMAB-SVOA 6 MG/0.05ML IZ SOSY
6.0000 mg | PREFILLED_SYRINGE | INTRAVITREAL | Status: AC | PRN
  Administered 2024-03-27: 6 mg via INTRAVITREAL

## 2024-04-02 ENCOUNTER — Inpatient Hospital Stay: Payer: Medicare HMO | Admitting: Hematology

## 2024-04-02 VITALS — BP 147/66 | HR 81 | Temp 98.2°F | Resp 18 | Wt 176.6 lb

## 2024-04-02 DIAGNOSIS — C182 Malignant neoplasm of ascending colon: Secondary | ICD-10-CM | POA: Diagnosis not present

## 2024-04-02 DIAGNOSIS — Z79899 Other long term (current) drug therapy: Secondary | ICD-10-CM | POA: Diagnosis not present

## 2024-04-02 DIAGNOSIS — C189 Malignant neoplasm of colon, unspecified: Secondary | ICD-10-CM

## 2024-04-02 DIAGNOSIS — Z800 Family history of malignant neoplasm of digestive organs: Secondary | ICD-10-CM | POA: Diagnosis not present

## 2024-04-02 DIAGNOSIS — I480 Paroxysmal atrial fibrillation: Secondary | ICD-10-CM | POA: Diagnosis not present

## 2024-04-02 DIAGNOSIS — D696 Thrombocytopenia, unspecified: Secondary | ICD-10-CM | POA: Diagnosis not present

## 2024-04-02 DIAGNOSIS — Z7901 Long term (current) use of anticoagulants: Secondary | ICD-10-CM | POA: Diagnosis not present

## 2024-04-02 DIAGNOSIS — Z808 Family history of malignant neoplasm of other organs or systems: Secondary | ICD-10-CM | POA: Diagnosis not present

## 2024-04-02 DIAGNOSIS — I10 Essential (primary) hypertension: Secondary | ICD-10-CM | POA: Diagnosis not present

## 2024-04-02 DIAGNOSIS — Z801 Family history of malignant neoplasm of trachea, bronchus and lung: Secondary | ICD-10-CM | POA: Diagnosis not present

## 2024-04-02 DIAGNOSIS — R918 Other nonspecific abnormal finding of lung field: Secondary | ICD-10-CM | POA: Diagnosis not present

## 2024-04-02 NOTE — Progress Notes (Signed)
 [facility] [address], [place] [address]    Clinic Day:  04/02/24    Referring physician: Eliodoro Guerin, DO  Patient Care Team: Eliodoro Guerin, DO as PCP - General (Family Medicine) Ronelle Coffee, MD as Consulting Physician (Retina Ophthalmology) Tarri Farm, MD as Consulting Physician (Ophthalmology) Alyce Jubilee, MD (Inactive) as Consulting Physician (Gastroenterology) Paulett Boros, MD as Medical Oncologist (Oncology)   ASSESSMENT & PLAN:   Assessment: 1.  Stage IIIb (T4AN1A) poorly differentiated right colon adenocarcinoma: -Right hemicolectomy on 04/07/2020 with poorly differentiated adenocarcinoma, pT4a, positive radial margin, 1/15 lymph nodes positive, loss of MLH1 and PMS2, bladder biopsy negative for malignancy. -CTAP on 04/06/2020 showed circumferential ascending colon mass with numerous borderline enlarged pericolonic lymph nodes.  No findings of hepatic metastatic disease. -CEA on 04/04/2020 was 12.2.  CEA improved to 1.7 on 05/13/2020. -PET scan on 05/11/2020 shows mild FDG uptake associated with the anastomotic site.  Small pulmonary nodules that do not show elevated FDG activity, remain nonspecific, subcentimeter.  Persistent but improved bladder wall thickening. -Cycle 1 of dose reduced FOLFOX on 05/19/2020, followed by 2 hospitalizations, 1 from acute kidney injury from diarrhea and second admission for C. difficile colitis. -Cycle 2 of 5-FU and leucovorin  dose reduced on 06/30/2020.  Chemotherapy discontinued secondary to intolerance. -Follow-up CT scan on 11/02/2020 with multiple soft tissue lesions in the central and right mesentery measuring up to 3.1 x 1.7 cm.  Mild lymphadenopathy in the hepatic duodenal ligament, retroperitoneal space, right common iliac chain, bilateral external iliac chains.  17 mm soft tissue nodule in the lower anterior abdominal wall.  Bilateral tiny pulmonary nodules not substantially changed.  2.9 x 2.5  cm low-density lesion along the posterior uterus is indeterminate. -Pembrolizumab  started on 11/10/2020.  Last dose on 11/30/2023. -CT CAP from 02/09/2021 showed interval near complete resolution of previously demonstrated nodal mass in the ileocolonic mesentery.  Residual ill-defined nodule medial to the tip of the right hepatic lobe.  Stable small pulmonary nodules bilaterally consistent with benign findings.   2.  Family history: -Paternal uncle had colon cancer.  Maternal aunt had brain cancer and another maternal aunt had gynecological malignancy.  Father had lung cancer and was a smoker. - Genetic testing did not reveal any notable mutations.   3.  Diffuse erythema and nodularity of the dome of the bladder: -CT scan showed severely thickened bladder wall. -Cystoscopy on 04/08/2020 showed diffuse erythema, nodularity in the posterior wall tracking to the dome.  Ureteral orifices were in normal locations.  Biopsies were benign.    Plan: 1.  Stage IV right colon adenocarcinoma, MSI-high: - Last Keytruda  dose on 11/30/2023.  We have stopped Keytruda  after discussion with the patient and monitoring closely. - She denies any abdominal pains.  Denies any new onset pains or change in bowel habits.  She is being active and is walking twice daily. - Reviewed labs from 03/25/2024: Normal LFTs and creatinine.  CBC is normal with mild thrombocytopenia.  CEA is 3.6.  TSH is normal. - CT CAP (03/25/2024): Reviewed by me with the patient showed bilateral stable lung nodules with no adenopathy.  No evidence of recurrence of carcinoma in the abdomen.  Other benign findings were discussed. - Recommend follow-up in 3 months with repeat imaging and labs.   2.  Paroxysmal atrial fibrillation: - Continue Eliquis  indefinitely.  No bleeding issues reported.   3.  Hypertension: - Continue amlodipine  and Hyzaar.  Blood pressure is 140/66.  4.  Hypokalemia: - Continue potassium 20 mill equivalents daily.  Potassium  is 3.5.    Orders Placed This Encounter  Procedures   CT CHEST ABDOMEN PELVIS W CONTRAST    Standing Status:   Future    Expected Date:   07/02/2024    Expiration Date:   04/02/2025    If indicated for the ordered procedure, I authorize the administration of contrast media per Radiology protocol:   Yes    Does the patient have a contrast media/X-ray dye allergy?:   No    Preferred imaging location?:   [facility]    If indicated for the ordered procedure, I authorize the administration of oral contrast media per Radiology protocol:   Yes   CBC with Differential    Standing Status:   Future    Expected Date:   07/01/2024    Expiration Date:   04/02/2025   Comprehensive metabolic panel    Standing Status:   Future    Expected Date:   07/01/2024    Expiration Date:   04/02/2025   CEA    Standing Status:   Future    Expected Date:   07/01/2024    Expiration Date:   04/02/2025   TSH    Standing Status:   Future    Expected Date:   07/01/2024    Expiration Date:   04/02/2025      [name],acting as a scribe for Paulett Boros, MD.,have documented all relevant documentation on the behalf of Paulett Boros, MD,as directed by  Paulett Boros, MD while in the presence of Paulett Boros, MD.  I, Paulett Boros MD, have reviewed the above documentation for accuracy and completeness, and I agree with the above.    Paulett Boros, MD   4/29/20253:20 PM  CHIEF COMPLAINT:   Diagnosis: right colon cancer    Cancer Staging  Malignant neoplasm of colon [facility]) Staging form: Colon and Rectum, AJCC 8th Edition - Clinical stage from 04/27/2020: Stage IIIB (cT4a, cN1a, cM0) - Unsigned - Pathologic stage from 11/05/2020: Stage IVC (rpTX, pN0, pM1c) - Signed by Paulett Boros, MD on 11/05/2020    Prior Therapy: Keytruda  every 3 weeks   Current Therapy:  COLORECTAL Pembrolizumab     HISTORY OF PRESENT ILLNESS:   Oncology History  Malignant  neoplasm of colon (HCC)  04/07/2020 Initial Diagnosis   Malignant neoplasm of ascending colon (HCC)   05/13/2020 Genetic Testing   [facility]     05/19/2020 - 07/02/2020 Chemotherapy   The patient had palonosetron  (ALOXI ) injection 0.25 mg, 0.25 mg, Intravenous,  Once, 2 of 12 cycles Administration: 0.25 mg (05/19/2020), 0.25 mg (06/30/2020) leucovorin  522 mg in dextrose  5 % 250 mL infusion, 320 mg/m2 = 522 mg (80 % of original dose 400 mg/m2), Intravenous,  Once, 2 of 12 cycles Dose modification: 320 mg/m2 (80 % of original dose 400 mg/m2, Cycle 1, Reason: Provider Judgment), 161.0960 mg/m2 (66.7 % of original dose 400 mg/m2, Cycle 2, Reason: Provider Judgment) Administration: 522 mg (05/19/2020), 434 mg (06/30/2020) oxaliplatin  (ELOXATIN ) 110 mg in dextrose  5 % 500 mL chemo infusion, 68 mg/m2 = 110 mg (80 % of original dose 85 mg/m2), Intravenous,  Once, 1 of 1 cycle Dose modification: 68 mg/m2 (80 % of original dose 85 mg/m2, Cycle 1, Reason: Provider Judgment) Administration: 110 mg (05/19/2020) fluorouracil  (ADRUCIL ) chemo injection 500 mg, 320 mg/m2 = 500 mg (80 % of original dose 400 mg/m2), Intravenous,  Once, 2 of 12 cycles Dose modification:  320 mg/m2 (80 % of original dose 400 mg/m2, Cycle 1, Reason: Provider Judgment), [id number] mg/m2 (66.7 % of original dose 400 mg/m2, Cycle 2, Reason: Provider Judgment) Administration: 500 mg (05/19/2020), 450 mg (06/30/2020) fluorouracil  (ADRUCIL ) 3,150 mg in sodium chloride  0.9 % 87 mL chemo infusion, 1,920 mg/m2 = 3,150 mg (80 % of original dose 2,400 mg/m2), Intravenous, 1 Day/Dose, 2 of 12 cycles Dose modification: 1,920 mg/m2 (80 % of original dose 2,400 mg/m2, Cycle 1, Reason: Provider Judgment), 1,600 mg/m2 (66.7 % of original dose 2,400 mg/m2, Cycle 2, Reason: Provider Judgment) Administration: 3,150 mg (05/19/2020), 2,600 mg (06/30/2020)  for chemotherapy treatment.    08/03/2020 Genetic Testing   [facility] Reveal Testing     08/14/2020  Genetic Testing   No pathogenic variants detected in [facility] Common Hereditary Cancers Panel.  Variant of uncertain significance (VUS) detected in HOXB13 at c.634G>A (p.Ala212Thr). The Common Hereditary Cancers Panel offered by [facility] includes sequencing and/or deletion duplication testing of the following 48 genes: APC, ATM, AXIN2, BARD1, BMPR1A, BRCA1, BRCA2, BRIP1, CDH1, CDK4, CDKN2A (p14ARF), CDKN2A (p16INK4a), CHEK2, CTNNA1, DICER1, EPCAM (Deletion/duplication testing only), FLCN, GREM1 (promoter region deletion/duplication testing only), KIT, MEN1, MLH1, MSH2, MSH3, MSH6, MUTYH, NBN, NF1, NHTL1, PALB2, PDGFRA, PMS2, POLD1, POLE, PTEN, RAD50, RAD51C, RAD51D, RNF43, SDHB, SDHC, SDHD, SMAD4, SMARCA4. STK11, TP53, TSC1, TSC2, and VHL.  The following genes were evaluated for sequence changes only: SDHA and HOXB13 c.251G>A variant only. The report date is August 14, 2020.    11/05/2020 Cancer Staging   Staging form: Colon and Rectum, AJCC 8th Edition - Pathologic stage from 11/05/2020: Stage IVC (rpTX, pN0, pM1c) - Signed by Paulett Boros, MD on 11/05/2020   11/10/2020 - 07/13/2022 Chemotherapy   Patient is on Treatment Plan : COLORECTAL Pembrolizumab  q21d     11/10/2020 -  Chemotherapy   Patient is on Treatment Plan : COLORECTAL Pembrolizumab  (200) q21d        INTERVAL HISTORY:   Gabrielle [name] is a 78 y.o. female presenting to clinic today for follow up of right colon cancer. She was last seen by me on 12/25/23.  Since her last visit, she underwent CT C/A/P on 03/25/24 that found: Stable bilateral pulmonary nodules. No thoracic adenopathy. No evidence of colorectal carcinoma recurrence or metastasis in the abdomen pelvis. Stable low-density lesion in the spleen. Favor benign splenic cyst.  Aortic Atherosclerosis.   Today, she states that she is doing well overall. Her appetite level is at 100%. Her energy level is at 100%. Gabrielle [name] is accompanied by a family member. She denies any recent falls,  abdominal pain or changes in BM's. She is taking potassium, Eliquis , amlodipine  and Hyzaar as prescribed.   PAST MEDICAL HISTORY:   Past Medical History: Past Medical History:  Diagnosis Date   Arthritis    Cancer (HCC)    Cataract    OU   Colon cancer (HCC)    Family history of pancreatic cancer 08/21/2020   Family history of uterine cancer 08/21/2020   H/O cesarean section    Hx of tonsillectomy    Hyperlipidemia    Hypertension    Kidney disease    Macular degeneration    Exu ARMD OU   Paroxysmal atrial fibrillation (HCC) 07/05/2020   Stroke (cerebrum) (HCC) 07/05/2020   Urinary retention 04/15/2020    Surgical History: Past Surgical History:  Procedure Laterality Date   BIOPSY  04/06/2020   Procedure: BIOPSY;  Surgeon: Ruby Corporal, MD;  Location: [facility];  Service: Endoscopy;;  CARPAL TUNNEL RELEASE Right    CATARACT EXTRACTION W/PHACO Left 10/11/2019   Procedure: CATARACT EXTRACTION PHACO AND INTRAOCULAR LENS PLACEMENT (IOC);  Surgeon: Tarri Farm, MD;  Location: [facility];  Service: Ophthalmology;  Laterality: Left;  CDE: 8.56   CATARACT EXTRACTION W/PHACO Right 10/25/2019   Procedure: CATARACT EXTRACTION PHACO AND INTRAOCULAR LENS PLACEMENT (IOC);  Surgeon: Tarri Farm, MD;  Location: [facility];  Service: Ophthalmology;  Laterality: Right;  CDE: 5.67   CESAREAN SECTION     COLONOSCOPY N/A 04/06/2020   Procedure: COLONOSCOPY;  Surgeon: Ruby Corporal, MD;  Location: [facility];  Service: Endoscopy;  Laterality: N/A;   CYSTOSCOPY WITH BIOPSY N/A 04/08/2020   Procedure: CYSTOSCOPY WITH BIOPSY;  Surgeon: Marco Severs, MD;  Location: [facility];  Service: Urology;  Laterality: N/A;   ESOPHAGOGASTRODUODENOSCOPY N/A 04/05/2020   Procedure: ESOPHAGOGASTRODUODENOSCOPY (EGD);  Surgeon: Ruby Corporal, MD;  Location: [facility];  Service: Endoscopy;  Laterality: N/A;   PARTIAL COLECTOMY N/A 04/08/2020   Procedure: PARTIAL COLECTOMY;  Surgeon: Awilda Bogus,  MD;  Location: [facility];  Service: General;  Laterality: N/A;   PORTACATH PLACEMENT Left 05/18/2020   Procedure: INSERTION PORT-A-CATH (ATTACHED CATHETER IN LEFT SUBCLAVIAN);  Surgeon: Awilda Bogus, MD;  Location: [facility];  Service: General;  Laterality: Left;   TONSILLECTOMY      Social History: Social History   Socioeconomic History   Marital status: Divorced    Spouse name: Not on file   Number of children: Not on file   Years of education: Not on file   Highest education level: Not on file  Occupational History   Not on file  Tobacco Use   Smoking status: Never   Smokeless tobacco: Never  Vaping Use   Vaping status: Never Used  Substance and Sexual Activity   Alcohol use: No   Drug use: No   Sexual activity: Not Currently  Other Topics Concern   Not on file  Social History Narrative   Not on file   Social Drivers of Health   Financial Resource Strain: Low Risk  (03/15/2024)   Overall Financial Resource Strain (CARDIA)    Difficulty of Paying Living Expenses: Not hard at all  Food Insecurity: No Food Insecurity (03/15/2024)   Hunger Vital Sign    Worried About Running Out of Food in the Last Year: Never true    Ran Out of Food in the Last Year: Never true  Transportation Needs: No Transportation Needs (03/15/2024)   PRAPARE - Administrator, Civil Service (Medical): No    Lack of Transportation (Non-Medical): No  Physical Activity: Sufficiently Active (03/15/2024)   Exercise Vital Sign    Days of Exercise per Week: 7 days    Minutes of Exercise per Session: 30 min  Stress: No Stress Concern Present (03/15/2024)   [employer] of Occupational Health - Occupational Stress Questionnaire    Feeling of Stress : Not at all  Social Connections: Moderately Isolated (03/15/2024)   Social Connection and Isolation Panel [NHANES]    Frequency of Communication with Friends and Family: More than three times a week    Frequency of Social Gatherings with Friends  and Family: More than three times a week    Attends Religious Services: More than 4 times per year    Active Member of [employer] or Organizations: No    Attends Banker Meetings: Never    Marital Status: Widowed  Intimate Partner Violence: Not At Risk (  03/15/2024)   Humiliation, Afraid, Rape, and Kick questionnaire    Fear of Current or Ex-Partner: No    Emotionally Abused: No    Physically Abused: No    Sexually Abused: No    Family History: Family History  Problem Relation Age of Onset   Macular degeneration Mother    Stroke Mother    Hypertension Mother    Dementia Mother    Lung cancer Father        dx late 46s; smoking hx   Diabetes Sister    Hypertension Sister    Leukemia Paternal Uncle        d. 67s   Cancer Maternal Aunt        ovarian or endometrial dx 22s   Pancreatic cancer Paternal Uncle        d. late 57s    Current Medications:  Current Outpatient Medications:    alendronate  (FOSAMAX ) 70 MG tablet, TAKE ONE TABLET EVERY 7 DAYS ON AN EMPTY STOMACH WITH A FULL GLASS OF WATER , Disp: 4 tablet, Rfl: 1   amLODipine  (NORVASC ) 10 MG tablet, Take 1 tablet (10 mg total) by mouth daily. For blood pressure, Disp: 90 tablet, Rfl: 3   atorvastatin  (LIPITOR) 20 MG tablet, Take 1 tablet (20 mg total) by mouth daily. For cholesterol, Disp: 90 tablet, Rfl: 3   cholecalciferol  (VITAMIN D3) 25 MCG (1000 UT) tablet, Take 1,000 Units by mouth daily., Disp: , Rfl:    CRANBERRY FRUIT PO, Take 1 tablet by mouth daily., Disp: , Rfl:    diclofenac  Sodium (VOLTAREN ) 1 % GEL, APPLY 4 GRAMS TO AFFECTED AREA(S) 4 TIMES A DAY (Patient taking differently: Apply 4 g topically 4 (four) times daily as needed (Pain).), Disp: 400 g, Rfl: 2   dorzolamide -timolol  (COSOPT ) 2-0.5 % ophthalmic solution, INSTILL ONE DROP IN THE RIGHT EYE TWICE DAILY, Disp: 30 mL, Rfl: 11   ELIQUIS  5 MG TABS tablet, Take 1 tablet (5 mg total) by mouth 2 (two) times daily., Disp: 180 tablet, Rfl: 3   Ferrous  Sulfate (IRON ) 325 (65 Fe) MG TABS, Take 1 tablet (325 mg total) by mouth daily., Disp: 90 tablet, Rfl: 3   fluticasone  (FLONASE ) 50 MCG/ACT nasal spray, PLACE 2 SPRAYS INTO EACH NOSTRIL ONCE A DAY, Disp: 16 g, Rfl: 6   guaiFENesin -codeine  100-10 MG/5ML syrup, Take 5 mLs by mouth 3 (three) times daily as needed for cough., Disp: 120 mL, Rfl: 0   hydrALAZINE  (APRESOLINE ) 25 MG tablet, Take 1 tablet (25 mg total) by mouth 2 (two) times daily., Disp: 180 tablet, Rfl: 3   Multiple Vitamins-Minerals (CENTRUM ADULT PO), Take 1 tablet by mouth daily., Disp: , Rfl:    Potassium Chloride  ER 20 MEQ TBCR, Take 1 tablet (20 mEq total) by mouth daily. Take while taking hydrochlorothiazide /HCTZ, Disp: 90 tablet, Rfl: 3   Allergies: Allergies  Allergen Reactions   Lisinopril  Cough    REVIEW OF SYSTEMS:   Review of Systems  Constitutional:  Negative for chills, fatigue and fever.  HENT:   Negative for lump/mass, mouth sores, nosebleeds, sore throat and trouble swallowing.   Eyes:  Negative for eye problems.  Respiratory:  Positive for cough. Negative for shortness of breath.   Cardiovascular:  Negative for chest pain, leg swelling and palpitations.  Gastrointestinal:  Negative for abdominal pain, constipation, diarrhea, nausea and vomiting.  Genitourinary:  Negative for bladder incontinence, difficulty urinating, dysuria, frequency, hematuria and nocturia.   Musculoskeletal:  Negative for arthralgias, back pain, flank pain, myalgias  and neck pain.  Skin:  Negative for itching and rash.  Neurological:  Positive for headaches. Negative for dizziness and numbness.  Hematological:  Does not bruise/bleed easily.  Psychiatric/Behavioral:  Negative for depression, sleep disturbance and suicidal ideas. The patient is not nervous/anxious.   All other systems reviewed and are negative.    VITALS:   Blood pressure (!) 147/66, pulse 81, temperature 98.2 F (36.8 C), temperature source Oral, resp. rate 18,  weight 176 lb 9.4 oz (80.1 kg), SpO2 96%.  Wt Readings from Last 3 Encounters:  04/02/24 176 lb 9.4 oz (80.1 kg)  03/15/24 170 lb (77.1 kg)  12/25/23 172 lb (78 kg)    Body mass index is 32.3 kg/m.  Performance status (ECOG): 1 - Symptomatic but completely ambulatory  PHYSICAL EXAM:   Physical Exam Vitals and nursing note reviewed. Exam conducted with a chaperone present.  Constitutional:      Appearance: Normal appearance.  Cardiovascular:     Rate and Rhythm: Normal rate and regular rhythm.     Pulses: Normal pulses.     Heart sounds: Normal heart sounds.  Pulmonary:     Effort: Pulmonary effort is normal.     Breath sounds: Normal breath sounds.  Abdominal:     Palpations: Abdomen is soft. There is no hepatomegaly, splenomegaly or mass.     Tenderness: There is no abdominal tenderness.  Musculoskeletal:     Right lower leg: No edema.     Left lower leg: No edema.  Lymphadenopathy:     Cervical: No cervical adenopathy.     Right cervical: No superficial, deep or posterior cervical adenopathy.    Left cervical: No superficial, deep or posterior cervical adenopathy.     Upper Body:     Right upper body: No supraclavicular or axillary adenopathy.     Left upper body: No supraclavicular or axillary adenopathy.  Neurological:     General: No focal deficit present.     Mental Status: She is alert and oriented to person, place, and time.  Psychiatric:        Mood and Affect: Mood normal.        Behavior: Behavior normal.     LABS:      Latest Ref Rng & Units 03/25/2024   10:55 AM 12/25/2023   11:49 AM 11/30/2023   12:01 PM  CBC  WBC 4.0 - 10.5 K/uL 7.1  6.3  6.1   Hemoglobin 12.0 - 15.0 g/dL 16.1  09.6  04.5   Hematocrit 36.0 - 46.0 % 37.2  37.5  38.9   Platelets 150 - 400 K/uL 143  148  158       Latest Ref Rng & Units 03/25/2024   10:55 AM 12/25/2023   11:49 AM 11/30/2023   12:01 PM  CMP  Glucose 70 - 99 mg/dL 409  811  914   BUN 8 - 23 mg/dL 20  15  16     Creatinine 0.44 - 1.00 mg/dL 7.82  9.56  2.13   Sodium 135 - 145 mmol/L 139  139  137   Potassium 3.5 - 5.1 mmol/L 3.5  3.4  3.5   Chloride 98 - 111 mmol/L 101  100  101   CO2 22 - 32 mmol/L 28  28  28    Calcium  8.9 - 10.3 mg/dL 9.3  9.6  9.8   Total Protein 6.5 - 8.1 g/dL 7.7  7.9  7.7   Total Bilirubin 0.0 - 1.2 mg/dL 0.7  0.5  0.5   Alkaline Phos 38 - 126 U/L 88  80  73   AST 15 - 41 U/L 22  20  17    ALT 0 - 44 U/L 16  14  13       Lab Results  Component Value Date   CEA1 3.6 03/25/2024   /  CEA  Date Value Ref Range Status  03/25/2024 3.6 0.0 - 4.7 ng/mL Final    Comment:    (NOTE)                             Nonsmokers          <3.9                             Smokers             <5.6 Roche [employer] Electrochemiluminescence Immunoassay (ECLIA) Values obtained with different assay methods or kits cannot be used interchangeably.  Results cannot be interpreted as absolute evidence of the presence or absence of malignant disease. Performed At: [facility] [address] [place], [place] [id number] Pearlean Botts MD WU:[id number]    No results found for: "PSA1" No results found for: "CAN199" No results found for: "CAN125"  No results found for: "TOTALPROTELP", "ALBUMINELP", "A1GS", "A2GS", "BETS", "BETA2SER", "GAMS", "MSPIKE", "SPEI" Lab Results  Component Value Date   TIBC 371 03/01/2023   TIBC 396 05/13/2020   TIBC 395 05/13/2020   FERRITIN 52 03/01/2023   FERRITIN 65 05/13/2020   FERRITIN 64 05/13/2020   IRONPCTSAT 18 03/01/2023   IRONPCTSAT 23 05/13/2020   IRONPCTSAT 23 05/13/2020   No results found for: "LDH"   STUDIES:   CT CHEST ABDOMEN PELVIS W CONTRAST Result Date: 04/01/2024 CLINICAL DATA:  Colorectal carcinoma. RIGHT hemicolectomy. Evaluate metastatic disease. Surveillance exam. * Tracking Code: BO * EXAM: CT CHEST, ABDOMEN, AND PELVIS WITH CONTRAST TECHNIQUE: Multidetector CT imaging of the chest, abdomen and pelvis was performed  following the standard protocol during bolus administration of intravenous contrast. RADIATION DOSE REDUCTION: This exam was performed according to the departmental dose-optimization program which includes automated exposure control, adjustment of the mA and/or kV according to patient size and/or use of iterative reconstruction technique. CONTRAST:  OMNIPAQUE  IOHEXOL  300 MG/ML  SOLN COMPARISON:  CT 12/21/2023 FINDINGS: CT CHEST FINDINGS Cardiovascular: No significant vascular findings. Normal heart size. No pericardial effusion. Port in the anterior chest wall with tip in distal SVC. Mediastinum/Nodes: No axillary or supraclavicular adenopathy. No mediastinal or hilar adenopathy. No pericardial fluid. Esophagus normal. Lungs/Pleura: RIGHT lobe rounded pulmonary nodule measuring 8 mm (76/3) is unchanged from 7 mm. LEFT lobe pulmonary nodule measuring 7 mm unchanged from comparison exam remeasured. (Image 90/3). No new pulmonary nodules. Musculoskeletal: No aggressive osseous lesion. CT ABDOMEN AND PELVIS FINDINGS Hepatobiliary: No focal hepatic lesion. No biliary ductal dilatation. Gallbladder is normal. Common bile duct is normal. Pancreas: Pancreas is normal. No ductal dilatation. No pancreatic inflammation. Spleen: 19 mm lesion in the spleen is low-density and unchanged from comparison exam. Adrenals/urinary tract: Adrenal glands and kidneys are normal. The ureters and bladder normal. Stomach/Bowel: Post RIGHT hemicolectomy. No nodularity obstruction anastomosis. Stomach duodenum small-bowel normal. The LEFT colon and rectosigmoid colon are normal. No mesenteric adenopathy. Vascular/Lymphatic: Abdominal aorta is normal caliber with atherosclerotic calcification. There is no retroperitoneal or periportal lymphadenopathy. No pelvic lymphadenopathy. Reproductive: Uterus and adnexa unremarkable. Other: No peritoneal  nodularity. Musculoskeletal: No aggressive osseous lesion. IMPRESSION: CHEST: 1. Stable bilateral  pulmonary nodules. 2. No thoracic adenopathy. PELVIS: 1. No evidence of colorectal carcinoma recurrence or metastasis in the abdomen pelvis. 2. Stable low-density lesion in the spleen. Favor benign splenic cyst. 3.  Aortic Atherosclerosis (ICD10-I70.0). Electronically Signed   By: Deboraha Fallow M.D.   On: 04/01/2024 11:49   Intravitreal Injection, Pharmacologic Agent - OS - Left Eye Result Date: 03/27/2024 Time Out 03/27/2024. 9:01 AM. Confirmed correct patient, procedure, site, and patient consented. Anesthesia Topical anesthesia was used. Anesthetic medications included Lidocaine  2%, Proparacaine 0.5%. Procedure Preparation included 5% betadine  to ocular surface, eyelid speculum. A supplied (32g) needle was used. Injection: 6 mg faricimab -svoa 6 MG/0.05ML   Route: Intravitreal, Site: Left Eye   NDC: 60454-098-11, Lot: [id number], Expiration date: 04/02/2025, Waste: 0 mL Post-op Post injection exam found visual acuity of at least counting fingers. The patient tolerated the procedure well. There were no complications. The patient received written and verbal post procedure care education. Post injection medications were not given.   OCT, Retina - OU - Both Eyes Result Date: 03/27/2024 Right Eye Quality was good. Central Foveal Thickness: 507. Progression has been stable. Findings include no SRF, abnormal foveal contour, retinal drusen , outer retinal tubulation, subretinal hyper-reflective material, disciform scar, epiretinal membrane, intraretinal fluid, pigment epithelial detachment, outer retinal atrophy (Persistent cystic changes overlying stable central SRHM / disciform scar). Left Eye Quality was good. Central Foveal Thickness: 277. Progression has improved. Findings include no SRF, abnormal foveal contour, retinal drusen , subretinal hyper-reflective material, intraretinal fluid, pigment epithelial detachment, outer retinal atrophy (Trace persistent cystic changes nasal fovea and macula overlying PED/SRHM  -- slightly improved). Notes *Images captured and stored on drive Diagnosis / Impression: Exudative ARMD OU OD: persistent cystic changes overlying stable central SRHM / disciform scar OS: trace persistent cystic changes nasal fovea and macula overlying PED/SRHM -- slightly improved Clinical management: See below Abbreviations: NFP - Normal foveal profile. CME - cystoid macular edema. PED - pigment epithelial detachment. IRF - intraretinal fluid. SRF - subretinal fluid. EZ - ellipsoid zone. ERM - epiretinal membrane. ORA - outer retinal atrophy. ORT - outer retinal tubulation. SRHM - subretinal hyper-reflective material

## 2024-04-02 NOTE — Patient Instructions (Signed)
 [facility] at [facility] Discharge Instructions   You were seen and examined today by Dr. Cheree Cords.  He reviewed the results of your lab work which are normal/stable.   He reviewed the results of your CT scan. The small spots in the lungs are stable. There is no evidence of cancer in the abdomen or pelvis.  We will see you back in 3 months. We will repeat lab work and a CT scan prior to this visit.    Return as scheduled.    Thank you for choosing [facility] at [facility] to provide your oncology and hematology care.  To afford each patient quality time with our provider, please arrive at least 15 minutes before your scheduled appointment time.   If you have a lab appointment with the Cancer Center please come in thru the Main Entrance and check in at the main information desk.  You need to re-schedule your appointment should you arrive 10 or more minutes late.  We strive to give you quality time with our providers, and arriving late affects you and other patients whose appointments are after yours.  Also, if you no show three or more times for appointments you may be dismissed from the clinic at the providers discretion.     Again, thank you for choosing [facility].  Our hope is that these requests will decrease the amount of time that you wait before being seen by our physicians.       _____________________________________________________________  Should you have questions after your visit to [facility], please contact our office at [phone] and follow the prompts.  Our office hours are 8:00 a.m. and 4:30 p.m. Monday - Friday.  Please note that voicemails left after 4:00 p.m. may not be returned until the following business day.  We are closed weekends and major holidays.  You do have access to a nurse 24-7, just call the main number to the clinic [phone] and do not press any options, hold on the line  and a nurse will answer the phone.    For prescription refill requests, have your pharmacy contact our office and allow 72 hours.    Due to Covid, you will need to wear a mask upon entering the hospital. If you do not have a mask, a mask will be given to you at the Main Entrance upon arrival. For doctor visits, patients may have 1 support person age 35 or older with them. For treatment visits, patients can not have anyone with them due to social distancing guidelines and our immunocompromised population.

## 2024-04-26 ENCOUNTER — Encounter: Payer: Medicare HMO | Admitting: Family Medicine

## 2024-04-26 NOTE — Progress Notes (Deleted)
 Gabrielle [name] is a 78 y.o. female presents to office today for annual physical exam examination.    Concerns today include: 1. ***  Occupation: ***, Marital status: ***, Substance use: *** Health Maintenance Due  Topic Date Due   DEXA SCAN  07/23/2023   COVID-19 Vaccine (4 - 2024-25 season) 08/06/2023   Refills needed today: ***  Immunization History  Administered Date(s) Administered   Fluad Quad(high Dose 65+) 08/26/2020, 11/16/2021, 09/06/2022   Influenza Split 01/25/2018   Influenza, High Dose Seasonal PF 11/19/2019   Influenza, Seasonal, Injecte, Preservative Fre 11/19/2019   Influenza,inj,Quad PF,6+ Mos 01/25/2018   Moderna Sars-Covid-2 Vaccination 01/14/2020, 02/11/2020, 10/06/2020   Pneumococcal Conjugate-13 01/25/2018   Pneumococcal Polysaccharide-23 06/24/2019   Td 04/12/2010   Td (Adult), 2 Lf Tetanus Toxid, Preservative Free 04/12/2010   Td (Adult),5 Lf Tetanus Toxid, Preservative Free 04/12/2010   Tdap 04/12/2010, 04/24/2023   Zoster Recombinant(Shingrix) 11/19/2019, 11/19/2019, 03/20/2020, 03/20/2020   Past Medical History:  Diagnosis Date   Arthritis    Cancer (HCC)    Cataract    OU   Colon cancer (HCC)    Family history of pancreatic cancer 08/21/2020   Family history of uterine cancer 08/21/2020   H/O cesarean section    Hx of tonsillectomy    Hyperlipidemia    Hypertension    Kidney disease    Macular degeneration    Exu ARMD OU   Paroxysmal atrial fibrillation (HCC) 07/05/2020   Stroke (cerebrum) (HCC) 07/05/2020   Urinary retention 04/15/2020   Social History   Socioeconomic History   Marital status: Divorced    Spouse name: Not on file   Number of children: Not on file   Years of education: Not on file   Highest education level: Not on file  Occupational History   Not on file  Tobacco Use   Smoking status: Never   Smokeless tobacco: Never  Vaping Use   Vaping status: Never Used  Substance and Sexual Activity   Alcohol use:  No   Drug use: No   Sexual activity: Not Currently  Other Topics Concern   Not on file  Social History Narrative   Not on file   Social Drivers of Health   Financial Resource Strain: Low Risk  (03/15/2024)   Overall Financial Resource Strain (CARDIA)    Difficulty of Paying Living Expenses: Not hard at all  Food Insecurity: No Food Insecurity (03/15/2024)   Hunger Vital Sign    Worried About Running Out of Food in the Last Year: Never true    Ran Out of Food in the Last Year: Never true  Transportation Needs: No Transportation Needs (03/15/2024)   PRAPARE - Administrator, Civil Service (Medical): No    Lack of Transportation (Non-Medical): No  Physical Activity: Sufficiently Active (03/15/2024)   Exercise Vital Sign    Days of Exercise per Week: 7 days    Minutes of Exercise per Session: 30 min  Stress: No Stress Concern Present (03/15/2024)   [employer] of Occupational Health - Occupational Stress Questionnaire    Feeling of Stress : Not at all  Social Connections: Moderately Isolated (03/15/2024)   Social Connection and Isolation Panel [NHANES]    Frequency of Communication with Friends and Family: More than three times a week    Frequency of Social Gatherings with Friends and Family: More than three times a week    Attends Religious Services: More than 4 times per year    Active  Member of Clubs or Organizations: No    Attends Banker Meetings: Never    Marital Status: Widowed  Intimate Partner Violence: Not At Risk (03/15/2024)   Humiliation, Afraid, Rape, and Kick questionnaire    Fear of Current or Ex-Partner: No    Emotionally Abused: No    Physically Abused: No    Sexually Abused: No   Past Surgical History:  Procedure Laterality Date   BIOPSY  04/06/2020   Procedure: BIOPSY;  Surgeon: Ruby Corporal, MD;  [facility]: [facility];  Service: Endoscopy;;   CARPAL TUNNEL RELEASE Right    CATARACT EXTRACTION W/PHACO Left 10/11/2019    Procedure: CATARACT EXTRACTION PHACO AND INTRAOCULAR LENS PLACEMENT (IOC);  Surgeon: Tarri Farm, MD;  Location: [facility];  Service: Ophthalmology;  Laterality: Left;  CDE: 8.56   CATARACT EXTRACTION W/PHACO Right 10/25/2019   Procedure: CATARACT EXTRACTION PHACO AND INTRAOCULAR LENS PLACEMENT (IOC);  Surgeon: Tarri Farm, MD;  Location: [facility];  Service: Ophthalmology;  Laterality: Right;  CDE: 5.67   CESAREAN SECTION     COLONOSCOPY N/A 04/06/2020   Procedure: COLONOSCOPY;  Surgeon: Ruby Corporal, MD;  [facility]: [facility];  Service: Endoscopy;  Laterality: N/A;   CYSTOSCOPY WITH BIOPSY N/A 04/08/2020   Procedure: CYSTOSCOPY WITH BIOPSY;  Surgeon: Marco Severs, MD;  Location: [facility];  Service: Urology;  Laterality: N/A;   ESOPHAGOGASTRODUODENOSCOPY N/A 04/05/2020   Procedure: ESOPHAGOGASTRODUODENOSCOPY (EGD);  Surgeon: Ruby Corporal, MD;  [facility];  Service: Endoscopy;  Laterality: N/A;   PARTIAL COLECTOMY N/A 04/08/2020   Procedure: PARTIAL COLECTOMY;  Surgeon: Awilda Bogus, MD;  Location: [facility];  Service: General;  Laterality: N/A;   PORTACATH PLACEMENT Left 05/18/2020   Procedure: INSERTION PORT-A-CATH (ATTACHED CATHETER IN LEFT SUBCLAVIAN);  Surgeon: Awilda Bogus, MD;  Location: [facility];  Service: General;  Laterality: Left;   TONSILLECTOMY     Family History  Problem Relation Age of Onset   Macular degeneration Mother    Stroke Mother    Hypertension Mother    Dementia Mother    Lung cancer Father        dx late 23s; smoking hx   Diabetes Sister    Hypertension Sister    Leukemia Paternal Uncle        d. 32s   Cancer Maternal Aunt        ovarian or endometrial dx 30s   Pancreatic cancer Paternal Uncle        d. late 51s    Current Outpatient Medications:    alendronate  (FOSAMAX ) 70 MG tablet, TAKE ONE TABLET EVERY 7 DAYS ON AN EMPTY STOMACH WITH A FULL GLASS OF WATER , Disp: 4 tablet, Rfl: 1   amLODipine  (NORVASC ) 10 MG tablet, Take 1  tablet (10 mg total) by mouth daily. For blood pressure, Disp: 90 tablet, Rfl: 3   atorvastatin  (LIPITOR) 20 MG tablet, Take 1 tablet (20 mg total) by mouth daily. For cholesterol, Disp: 90 tablet, Rfl: 3   cholecalciferol  (VITAMIN D3) 25 MCG (1000 UT) tablet, Take 1,000 Units by mouth daily., Disp: , Rfl:    CRANBERRY FRUIT PO, Take 1 tablet by mouth daily., Disp: , Rfl:    diclofenac  Sodium (VOLTAREN ) 1 % GEL, APPLY 4 GRAMS TO AFFECTED AREA(S) 4 TIMES A DAY (Patient taking differently: Apply 4 g topically 4 (four) times daily as needed (Pain).), Disp: 400 g, Rfl: 2   dorzolamide -timolol  (COSOPT ) 2-0.5 % ophthalmic solution, INSTILL ONE DROP IN  THE RIGHT EYE TWICE DAILY, Disp: 30 mL, Rfl: 11   ELIQUIS  5 MG TABS tablet, Take 1 tablet (5 mg total) by mouth 2 (two) times daily., Disp: 180 tablet, Rfl: 3   Ferrous Sulfate  (IRON ) 325 (65 Fe) MG TABS, Take 1 tablet (325 mg total) by mouth daily., Disp: 90 tablet, Rfl: 3   fluticasone  (FLONASE ) 50 MCG/ACT nasal spray, PLACE 2 SPRAYS INTO EACH NOSTRIL ONCE A DAY, Disp: 16 g, Rfl: 6   guaiFENesin -codeine  100-10 MG/5ML syrup, Take 5 mLs by mouth 3 (three) times daily as needed for cough., Disp: 120 mL, Rfl: 0   hydrALAZINE  (APRESOLINE ) 25 MG tablet, Take 1 tablet (25 mg total) by mouth 2 (two) times daily., Disp: 180 tablet, Rfl: 3   Multiple Vitamins-Minerals (CENTRUM ADULT PO), Take 1 tablet by mouth daily., Disp: , Rfl:    Potassium Chloride  ER 20 MEQ TBCR, Take 1 tablet (20 mEq total) by mouth daily. Take while taking hydrochlorothiazide /HCTZ, Disp: 90 tablet, Rfl: 3  Allergies  Allergen Reactions   Lisinopril  Cough     ROS: Review of Systems {ros; complete:30496}    Physical exam {Exam, [phone]}      03/15/2024   11:49 AM 07/24/2023    3:14 PM 06/26/2023   10:01 AM  Depression screen PHQ 2/9  Decreased Interest 0 0 0  Down, Depressed, Hopeless 0 0 0  PHQ - 2 Score 0 0 0  Altered sleeping 0 0 0  Tired, decreased energy 0 0 0   Change in appetite 0 0 0  Feeling bad or failure about yourself  0 0 0  Trouble concentrating 0 0 0  Moving slowly or fidgety/restless 0 0 0  Suicidal thoughts 0 0 0  PHQ-9 Score 0 0 0  Difficult doing work/chores Not difficult at all Not difficult at all Not difficult at all      07/24/2023    3:15 PM 06/26/2023   10:01 AM 04/24/2023   10:09 AM 04/18/2023   11:35 AM  GAD 7 : Generalized Anxiety Score  Nervous, Anxious, on Edge 0 0 0 0  Control/stop worrying 0 0 0 0  Worry too much - different things 0 0 0 0  Trouble relaxing 0 0 0 0  Restless 0 0 0 0  Easily annoyed or irritable 0 0 0 0  Afraid - awful might happen 0 0 0 0  Total GAD 7 Score 0 0 0 0  Anxiety Difficulty Not difficult at all Not difficult at all Not difficult at all Not difficult at all     Assessment/ Plan: Gabrielle [name] here for annual physical exam.   Annual physical exam  Stage 3b chronic kidney disease (HCC)  Essential hypertension  Mixed hyperlipidemia  Osteopenia with high risk of fracture  Paroxysmal atrial fibrillation (HCC)  ***  Counseled on healthy lifestyle choices, including diet (rich in fruits, vegetables and lean meats and low in salt and simple carbohydrates) and exercise (at least 30 minutes of moderate physical activity daily).  Patient to follow up ***  Lennette Fader M. Bonnell Butcher, DO

## 2024-04-27 ENCOUNTER — Other Ambulatory Visit: Payer: Self-pay | Admitting: Family Medicine

## 2024-04-27 DIAGNOSIS — I480 Paroxysmal atrial fibrillation: Secondary | ICD-10-CM

## 2024-04-30 NOTE — Telephone Encounter (Signed)
 Last OV 08/02/2023. Last RF 04/24/2023. Next OV not scheduled-no showed 04/26/24

## 2024-05-01 NOTE — Progress Notes (Signed)
 [facility] - Clinic Note  05/08/2024    CHIEF COMPLAINT Patient presents for Retina Follow Up  HISTORY OF PRESENT ILLNESS: Gabrielle [name] is a 78 y.o. female who presents to the clinic today for:   HPI     Retina Follow Up   Patient presents with  Wet AMD.  In both eyes.  This started 6 weeks ago.  Duration of 6 weeks.  Since onset it is stable.  I, the attending physician,  performed the HPI with the patient and updated documentation appropriately.        Comments   6 week retina follow up ARMD OU and IVV OS pt is reporting no vision changes noticed she denies any flashes or floaters       Last edited by Ronelle Coffee, MD on 05/08/2024  1:14 PM.       Referring physician: Eliodoro Guerin, DO [address] [place],  [place] [address]  HISTORICAL INFORMATION:   Selected notes from the MEDICAL RECORD NUMBER Referred by Dr. Patriciaann Boone for concern of ARMD OU   CURRENT MEDICATIONS: Current Outpatient Medications (Ophthalmic Drugs)  Medication Sig   dorzolamide -timolol  (COSOPT ) 2-0.5 % ophthalmic solution INSTILL ONE DROP IN THE RIGHT EYE TWICE DAILY   No current facility-administered medications for this visit. (Ophthalmic Drugs)   Current Outpatient Medications (Other)  Medication Sig   alendronate  (FOSAMAX ) 70 MG tablet Take 1 tablet (70 mg total) by mouth every 7 (seven) days. On an empty stomach with a full glass of water  **NEEDS TO BE SEEN BEFORE NEXT REFILL**   amLODipine  (NORVASC ) 10 MG tablet Take 1 tablet (10 mg total) by mouth daily. For blood pressure   atorvastatin  (LIPITOR) 20 MG tablet Take 1 tablet (20 mg total) by mouth daily. For cholesterol   cholecalciferol  (VITAMIN D3) 25 MCG (1000 UT) tablet Take 1,000 Units by mouth daily.   CRANBERRY FRUIT PO Take 1 tablet by mouth daily.   diclofenac  Sodium (VOLTAREN ) 1 % GEL APPLY 4 GRAMS TO AFFECTED AREA(S) 4 TIMES A DAY (Patient taking differently: Apply 4 g topically 4 (four) times daily as  needed (Pain).)   ELIQUIS  5 MG TABS tablet Take 1 tablet (5 mg total) by mouth 2 (two) times daily. NEEDS TO SCHEDULE OV   Ferrous Sulfate  (IRON ) 325 (65 Fe) MG TABS Take 1 tablet (325 mg total) by mouth daily.   fluticasone  (FLONASE ) 50 MCG/ACT nasal spray PLACE 2 SPRAYS INTO EACH NOSTRIL ONCE A DAY   guaiFENesin -codeine  100-10 MG/5ML syrup Take 5 mLs by mouth 3 (three) times daily as needed for cough.   hydrALAZINE  (APRESOLINE ) 25 MG tablet Take 1 tablet (25 mg total) by mouth 2 (two) times daily.   Multiple Vitamins-Minerals (CENTRUM ADULT PO) Take 1 tablet by mouth daily.   Potassium Chloride  ER 20 MEQ TBCR Take 1 tablet (20 mEq total) by mouth daily. Take while taking hydrochlorothiazide /HCTZ   No current facility-administered medications for this visit. (Other)   REVIEW OF SYSTEMS: ROS   Negative for: Cardiovascular, Eyes Last edited by Alise Appl, COT on 05/08/2024  7:53 AM.       ALLERGIES Allergies  Allergen Reactions   Lisinopril  Cough   PAST MEDICAL HISTORY Past Medical History:  Diagnosis Date   Arthritis    Cancer [facility])    Cataract    OU   Colon cancer (HCC)    Family history of pancreatic cancer 08/21/2020   Family history of uterine cancer 08/21/2020  H/O cesarean section    Hx of tonsillectomy    Hyperlipidemia    Hypertension    Kidney disease    Macular degeneration    Exu ARMD OU   Paroxysmal atrial fibrillation (HCC) 07/05/2020   Stroke (cerebrum) (HCC) 07/05/2020   Urinary retention 04/15/2020   Past Surgical History:  Procedure Laterality Date   BIOPSY  04/06/2020   Procedure: BIOPSY;  Surgeon: Ruby Corporal, MD;  [facility]: [facility];  Service: Endoscopy;;   CARPAL TUNNEL RELEASE Right    CATARACT EXTRACTION W/PHACO Left 10/11/2019   Procedure: CATARACT EXTRACTION PHACO AND INTRAOCULAR LENS PLACEMENT (IOC);  Surgeon: Tarri Farm, MD;  Location: [facility];  Service: Ophthalmology;  Laterality: Left;  CDE: 8.56   CATARACT  EXTRACTION W/PHACO Right 10/25/2019   Procedure: CATARACT EXTRACTION PHACO AND INTRAOCULAR LENS PLACEMENT (IOC);  Surgeon: Tarri Farm, MD;  Location: [facility];  Service: Ophthalmology;  Laterality: Right;  CDE: 5.67   CESAREAN SECTION     COLONOSCOPY N/A 04/06/2020   Procedure: COLONOSCOPY;  Surgeon: Ruby Corporal, MD;  [facility];  Service: Endoscopy;  Laterality: N/A;   CYSTOSCOPY WITH BIOPSY N/A 04/08/2020   Procedure: CYSTOSCOPY WITH BIOPSY;  Surgeon: Marco Severs, MD;  Location: [facility];  Service: Urology;  Laterality: N/A;   ESOPHAGOGASTRODUODENOSCOPY N/A 04/05/2020   Procedure: ESOPHAGOGASTRODUODENOSCOPY (EGD);  Surgeon: Ruby Corporal, MD;  [facility];  Service: Endoscopy;  Laterality: N/A;   PARTIAL COLECTOMY N/A 04/08/2020   Procedure: PARTIAL COLECTOMY;  Surgeon: Awilda Bogus, MD;  Location: [facility];  Service: General;  Laterality: N/A;   PORTACATH PLACEMENT Left 05/18/2020   Procedure: INSERTION PORT-A-CATH (ATTACHED CATHETER IN LEFT SUBCLAVIAN);  Surgeon: Awilda Bogus, MD;  Location: [facility];  Service: General;  Laterality: Left;   TONSILLECTOMY     FAMILY HISTORY Family History  Problem Relation Age of Onset   Macular degeneration Mother    Stroke Mother    Hypertension Mother    Dementia Mother    Lung cancer Father        dx late 56s; smoking hx   Diabetes Sister    Hypertension Sister    Leukemia Paternal Uncle        d. 6s   Cancer Maternal Aunt        ovarian or endometrial dx 50s   Pancreatic cancer Paternal Uncle        d. late 44s   SOCIAL HISTORY Social History   Tobacco Use   Smoking status: Never   Smokeless tobacco: Never  Vaping Use   Vaping status: Never Used  Substance Use Topics   Alcohol use: No   Drug use: No       OPHTHALMIC EXAM: Base Eye Exam     Visual Acuity (Snellen - Linear)       Right Left   Dist cc CF at 3' 20/60 -2   Dist ph cc NI NI    Correction: Glasses         Tonometry  (Tonopen, 7:56 AM)       Right Left   Pressure 16 16         Pupils       Pupils Dark Light Shape React APD   Right PERRL 3 2 Round Brisk None   Left PERRL 3 2 Round Brisk None         Visual Fields       Left Right  Full Full         Extraocular Movement       Right Left    Full, Ortho Full, Ortho         Neuro/Psych     Oriented x3: Yes   Mood/Affect: Normal         Dilation     Both eyes: 2.5% Phenylephrine  @ 7:56 AM           Slit Lamp and Fundus Exam     Slit Lamp Exam       Right Left   Lids/Lashes Dermatochalasis - upper lid Dermatochalasis - upper lid   Conjunctiva/Sclera 1+ Injection Trace Injection, +mucus   Cornea Arcus, 3+ fine Punctpate epithelial erosions Arcus, trace Punctate epithelial erosions, mild tear film debris, trace endo pigment, high tear lake   Anterior Chamber Deep and quiet Deep and quiet   Iris Round and well dilated Round and well dilated   Lens PCIOL in good position, 1+ Posterior capsular opacification PC IOL in good position, trace PCO nasally   Anterior Vitreous Vitreous syneresis, Posterior vitreous detachment, vitreous condensations Vitreous syneresis, Posterior vitreous detachment, silicone oil micro drops         Fundus Exam       Right Left   Disc pink and sharp, compact mild pallor, sharp rim, Compact, vascular loops superiorly   C/D Ratio 0.2 0.2   Macula Blunted foveal reflex, large central GA, Drusen, Pigment clumping and atrophy, central thickening/pigmented disciform scar, +PED, punctate IRH superior macula within atrophy -- stably resolved, persistent trace cystic changes overlying central scar Blunted foveal reflex, central thickening, RPE clumping and atrophy, Drusen, trace cystic changes overlying PED -- persistent, no frank heme   Vessels Vascular attenuation, Tortuous Vascular attenuation, Tortuous   Periphery Attached, mild Reticular degeneration Attached, mild Reticular degeneration            Refraction     Wearing Rx       Sphere Cylinder Add   Right [place] Sphere +3.50   Left -0.50 Sphere +3.50    Type: PAL           IMAGING AND PROCEDURES  Imaging and Procedures for 03/21/18  OCT, Retina - OU - Both Eyes       Right Eye Quality was good. Central Foveal Thickness: 507. Progression has been stable. Findings include no SRF, abnormal foveal contour, retinal drusen , outer retinal tubulation, subretinal hyper-reflective material, disciform scar, epiretinal membrane, intraretinal fluid, pigment epithelial detachment, outer retinal atrophy (Persistent cystic changes overlying stable central SRHM / disciform scar).   Left Eye Quality was good. Central Foveal Thickness: 280. Progression has been stable. Findings include no SRF, abnormal foveal contour, retinal drusen , subretinal hyper-reflective material, intraretinal fluid, pigment epithelial detachment, outer retinal atrophy (Trace persistent cystic changes nasal fovea and macula overlying PED/SRHM).   Notes *Images captured and stored on drive  Diagnosis / Impression:  Exudative ARMD OU OD: persistent cystic changes overlying stable central SRHM / disciform scar  OS: trace persistent cystic changes nasal fovea and macula overlying PED/SRHM   Clinical management:  See below  Abbreviations: NFP - Normal foveal profile. CME - cystoid macular edema. PED - pigment epithelial detachment. IRF - intraretinal fluid. SRF - subretinal fluid. EZ - ellipsoid zone. ERM - epiretinal membrane. ORA - outer retinal atrophy. ORT - outer retinal tubulation. SRHM - subretinal hyper-reflective material      Intravitreal Injection, Pharmacologic Agent - OS - Left Eye  Time Out 05/08/2024. 8:47 AM. Confirmed correct patient, procedure, site, and patient consented.   Anesthesia Topical anesthesia was used. Anesthetic medications included Lidocaine  2%, Proparacaine 0.5%.   Procedure Preparation included 5% betadine  to  ocular surface, eyelid speculum. A supplied (32g) needle was used.   Injection: 6 mg faricimab -svoa 6 MG/0.05ML   Route: Intravitreal, Site: Left Eye   NDC: 84696-295-28, Lot: [id number], Expiration date: 04/03/2025, Waste: 0 mL   Post-op Post injection exam found visual acuity of at least counting fingers. The patient tolerated the procedure well. There were no complications. The patient received written and verbal post procedure care education. Post injection medications were not given.             ASSESSMENT/PLAN:    ICD-10-CM   1. Exudative age-related macular degeneration of both eyes with active choroidal neovascularization (HCC)  H35.3231 OCT, Retina - OU - Both Eyes    Intravitreal Injection, Pharmacologic Agent - OS - Left Eye    faricimab -svoa (VABYSMO ) 6mg /0.67mL intravitreal injection    CANCELED: Intravitreal Injection, Pharmacologic Agent - OS - Left Eye    2. Posterior vitreous detachment of both eyes  H43.813     3. Essential hypertension  I10     4. Hypertensive retinopathy of both eyes  H35.033     5. Pseudophakia of both eyes  Z96.1     6. Ocular hypertension, bilateral  H40.053     7. Dry eyes  H04.123       1. Exudative age-related macular degeneration, both eyes - severe exudative disease with very active CNVM OU at presentation in January 2019  - S/P IVA OD #1 (01.04.19), #2 (02.15.19)  - S/P IVA OS #1 (01.18.19), #2 (02.15.19)  - switched to Eylea  3.18.19 due to severity of disease  =============================================== - S/P IVE OD #1 (03.18.19), #2 (04.17.19), #3 (05.15.19), #4 (10.02.19),  #5 (01.15.20), #6 (06.04.20), #7 (10.29.20), #8 (01.21.21), #9 (05.27.21), #10 (07.22.21) -- injections held due to stable disciform scar, #11 (08.12.24) for new IRH, #12 (09.25.24), #13 (11.06.24) - S/P IVE OS #1 (03.18.19), #2 (04.17.19), #3 (05.15.19), #4 (06.12.19), #5 (07.10.19), #6 (08.07.19), #7 (09.04.19), #8 (10.02.19), #9 (11.06.19), #10  (12.11.19), #11 (01.15.20), #12 (02.20.20), #13 (03.26.20), #14 (04.30.20), #15 (06.04.20), #16 (07.16.20), # 17 (08.20.20), #18 (09.24.20), #19 (10.29.20), #20 (12.03.20), #21 (01.21.21), #22 (03.18.21), #23 (05.27.21), #24 (07.22.21), #25 (11.11.21), #26 (12.09.21), #27 (01.06.22), #28 (2.7.22), #29 (03.24.22), #30 (4.28.22), #31 (05.31.22), #32 (06.30.22), #33 (08.04.22), #34 (09.15.22), #35 (10.27.22), #36 (12.08.22), #37 (01.12.23), #38 (02.16.23), #39 (03.30.23), #40 (05.11.23), #41 (06.22.23), #42 (07.27.23), #43 (09.01.23), #44 (10.06.23), #45 (11.10.23), #46 (12.15.23) -- IVE resistance ===================================================  - s/p IVV OS #1 (01.12.24 -- sample), #2 (02.09.24), #3 (03.15.24), #4 (04.19.24), #5 (05.24.24), #6 (07.01.24), #7 (08.12.24), #8 (09.25.24), #9 (11.06.24), #10 (12.18.24), #11 (01.29.25), #12 (03.12.25), #13 (04.23.25)  - IVE OS held from 7.22.21 to 11.11.21 due to TIA/stroke on 8.1.21 - OCT OD: persistent cystic changes overlying stable central SRHM / disciform scar; OS: trace persistent cystic changes nasal fovea and macula overlying PED/SRHM at 6 weeks  - exam OD with focal IRH superior macula within atrophy -- improved  - BCVA OD stable at CF 3'; OS 20/60 from 20/70  - recommend IVV OS #14 today, 06.04.25 w/ f/u at 6 wks again - IVV authorization obtained and Good Days reinstated - will hold IVE OD today -- pt in agreement - RBA of procedure discussed, questions answered - IVV informed consent obtained and signed on 01.29.25 -  see procedure note   - [employer] funding reinstated for Ms. Stouffer as of 03.12.25 visit - f/u in 6 weeks, sooner prn for DFE/OCT/possible injection(s)  2. PVD / vitreous syneresis  - Discussed findings and prognosis   - No RT or RD on 360 exam  - Reviewed s/s of RT/RD  - strict return precautions for any such RT/RD symptoms  3,4. Hypertensive retinopathy OU - exam shows persistent focal IRH OD within GA - discussed  importance of tight BP control - monitor  5. Pseudophakia OU  - s/p CE/IOL (Dr. Adora Hoover, 11.2020)  - beautiful surgeries w/ IOLs in excellent position, doing well  - monitor  6. Ocular Hypertension OU  - IOP 16 OU  - continue cosopt  bid OU  7. Dry eyes OU - recommend artificial tears and lubricating ointment as needed  Ophthalmic Meds Ordered this visit:  Meds ordered this encounter  Medications   faricimab -svoa (VABYSMO ) 6mg /0.101mL intravitreal injection     Return in about 6 weeks (around 06/19/2024) for f/u exu ARMD OU, DFE, OCT, Possible Injxn.  There are no Patient Instructions on file for this visit.  This document serves as a record of services personally performed by Jeanice Millard, MD, PhD. It was created on their behalf by Angelia Kelp, an ophthalmic technician. The creation of this record is the provider's dictation and/or activities during the visit.    Electronically signed by: Angelia Kelp, OA, 05/16/24  3:43 AM  This document serves as a record of services personally performed by Jeanice Millard, MD, PhD. It was created on their behalf by Morley Arabia. Bevin Bucks, OA an ophthalmic technician. The creation of this record is the provider's dictation and/or activities during the visit.    Electronically signed by: Morley Arabia. Bevin Bucks, OA 05/16/24 3:43 AM  Jeanice Millard, M.D., Ph.D. Diseases & Surgery of the Retina and Vitreous [facility] 05/08/2024   I have reviewed the above documentation for accuracy and completeness, and I agree with the above. Jeanice Millard, M.D., Ph.D. 05/16/24 3:44 AM   Abbreviations: M myopia (nearsighted); A astigmatism; H hyperopia (farsighted); P presbyopia; Mrx spectacle prescription;  CTL contact lenses; OD right eye; OS left eye; OU both eyes  XT exotropia; ET esotropia; PEK punctate epithelial keratitis; PEE punctate epithelial erosions; DES dry eye syndrome; MGD meibomian gland dysfunction; ATs artificial  tears; PFAT's preservative free artificial tears; NSC nuclear sclerotic cataract; PSC posterior subcapsular cataract; ERM epi-retinal membrane; PVD posterior vitreous detachment; RD retinal detachment; DM diabetes mellitus; DR diabetic retinopathy; NPDR non-proliferative diabetic retinopathy; PDR proliferative diabetic retinopathy; CSME clinically significant macular edema; DME diabetic macular edema; dbh dot blot hemorrhages; CWS cotton wool spot; POAG primary open angle glaucoma; C/D cup-to-disc ratio; HVF humphrey visual field; GVF goldmann visual field; OCT optical coherence tomography; IOP intraocular pressure; BRVO Branch retinal vein occlusion; CRVO central retinal vein occlusion; CRAO central retinal artery occlusion; BRAO branch retinal artery occlusion; RT retinal tear; SB scleral buckle; PPV pars plana vitrectomy; VH Vitreous hemorrhage; PRP panretinal laser photocoagulation; IVK intravitreal kenalog; VMT vitreomacular traction; MH Macular hole;  NVD neovascularization of the disc; NVE neovascularization elsewhere; AREDS age related eye disease study; ARMD age related macular degeneration; POAG primary open angle glaucoma; EBMD epithelial/anterior basement membrane dystrophy; ACIOL anterior chamber intraocular lens; IOL intraocular lens; PCIOL posterior chamber intraocular lens; Phaco/IOL phacoemulsification with intraocular lens placement; PRK photorefractive keratectomy; LASIK laser assisted in situ keratomileusis; HTN hypertension; DM diabetes mellitus; COPD chronic obstructive pulmonary  disease

## 2024-05-02 ENCOUNTER — Encounter: Payer: Self-pay | Admitting: Family Medicine

## 2024-05-06 ENCOUNTER — Other Ambulatory Visit: Payer: Self-pay | Admitting: Family Medicine

## 2024-05-08 ENCOUNTER — Encounter (INDEPENDENT_AMBULATORY_CARE_PROVIDER_SITE_OTHER): Payer: Self-pay | Admitting: Ophthalmology

## 2024-05-08 ENCOUNTER — Ambulatory Visit (INDEPENDENT_AMBULATORY_CARE_PROVIDER_SITE_OTHER): Admitting: Ophthalmology

## 2024-05-08 DIAGNOSIS — H353231 Exudative age-related macular degeneration, bilateral, with active choroidal neovascularization: Secondary | ICD-10-CM | POA: Diagnosis not present

## 2024-05-08 DIAGNOSIS — H04123 Dry eye syndrome of bilateral lacrimal glands: Secondary | ICD-10-CM | POA: Diagnosis not present

## 2024-05-08 DIAGNOSIS — Z961 Presence of intraocular lens: Secondary | ICD-10-CM

## 2024-05-08 DIAGNOSIS — H40053 Ocular hypertension, bilateral: Secondary | ICD-10-CM | POA: Diagnosis not present

## 2024-05-08 DIAGNOSIS — I10 Essential (primary) hypertension: Secondary | ICD-10-CM | POA: Diagnosis not present

## 2024-05-08 DIAGNOSIS — H43813 Vitreous degeneration, bilateral: Secondary | ICD-10-CM | POA: Diagnosis not present

## 2024-05-08 DIAGNOSIS — H35033 Hypertensive retinopathy, bilateral: Secondary | ICD-10-CM

## 2024-05-08 MED ORDER — FARICIMAB-SVOA 6 MG/0.05ML IZ SOSY
6.0000 mg | PREFILLED_SYRINGE | INTRAVITREAL | Status: AC | PRN
  Administered 2024-05-08: 6 mg via INTRAVITREAL

## 2024-05-09 DIAGNOSIS — N189 Chronic kidney disease, unspecified: Secondary | ICD-10-CM | POA: Diagnosis not present

## 2024-05-09 DIAGNOSIS — D631 Anemia in chronic kidney disease: Secondary | ICD-10-CM | POA: Diagnosis not present

## 2024-05-09 DIAGNOSIS — R809 Proteinuria, unspecified: Secondary | ICD-10-CM | POA: Diagnosis not present

## 2024-05-09 DIAGNOSIS — E211 Secondary hyperparathyroidism, not elsewhere classified: Secondary | ICD-10-CM | POA: Diagnosis not present

## 2024-05-16 DIAGNOSIS — N1831 Chronic kidney disease, stage 3a: Secondary | ICD-10-CM | POA: Diagnosis not present

## 2024-05-16 DIAGNOSIS — I5032 Chronic diastolic (congestive) heart failure: Secondary | ICD-10-CM | POA: Diagnosis not present

## 2024-05-16 DIAGNOSIS — I129 Hypertensive chronic kidney disease with stage 1 through stage 4 chronic kidney disease, or unspecified chronic kidney disease: Secondary | ICD-10-CM | POA: Diagnosis not present

## 2024-05-16 DIAGNOSIS — R809 Proteinuria, unspecified: Secondary | ICD-10-CM | POA: Diagnosis not present

## 2024-05-28 DIAGNOSIS — N189 Chronic kidney disease, unspecified: Secondary | ICD-10-CM | POA: Diagnosis not present

## 2024-05-30 ENCOUNTER — Other Ambulatory Visit: Payer: Self-pay | Admitting: Family Medicine

## 2024-05-30 DIAGNOSIS — I10 Essential (primary) hypertension: Secondary | ICD-10-CM

## 2024-06-04 ENCOUNTER — Other Ambulatory Visit (INDEPENDENT_AMBULATORY_CARE_PROVIDER_SITE_OTHER): Payer: Self-pay

## 2024-06-04 DIAGNOSIS — Z961 Presence of intraocular lens: Secondary | ICD-10-CM

## 2024-06-04 DIAGNOSIS — H04123 Dry eye syndrome of bilateral lacrimal glands: Secondary | ICD-10-CM

## 2024-06-04 DIAGNOSIS — H43813 Vitreous degeneration, bilateral: Secondary | ICD-10-CM

## 2024-06-04 DIAGNOSIS — I10 Essential (primary) hypertension: Secondary | ICD-10-CM

## 2024-06-04 DIAGNOSIS — H40053 Ocular hypertension, bilateral: Secondary | ICD-10-CM

## 2024-06-04 DIAGNOSIS — H35033 Hypertensive retinopathy, bilateral: Secondary | ICD-10-CM

## 2024-06-04 DIAGNOSIS — H353231 Exudative age-related macular degeneration, bilateral, with active choroidal neovascularization: Secondary | ICD-10-CM

## 2024-06-04 NOTE — Progress Notes (Signed)
 [facility] - Clinic Note  06/04/2024    CHIEF COMPLAINT Patient presents for No chief complaint on file.  HISTORY OF PRESENT ILLNESS: Gabrielle [name] is a 78 y.o. female who presents to the clinic today for:       Referring physician: No referring provider defined for this encounter.  HISTORICAL INFORMATION:   Selected notes from the MEDICAL RECORD NUMBER Referred by Dr. MICAEL Andreas for concern of ARMD OU   CURRENT MEDICATIONS: Current Outpatient Medications (Ophthalmic Drugs)  Medication Sig   dorzolamide -timolol  (COSOPT ) 2-0.5 % ophthalmic solution INSTILL ONE DROP IN THE RIGHT EYE TWICE DAILY   No current facility-administered medications for this visit. (Ophthalmic Drugs)   Current Outpatient Medications (Other)  Medication Sig   alendronate  (FOSAMAX ) 70 MG tablet Take 1 tablet (70 mg total) by mouth every 7 (seven) days. On an empty stomach with a full glass of water  **NEEDS TO BE SEEN BEFORE NEXT REFILL**   amLODipine  (NORVASC ) 10 MG tablet Take 1 tablet (10 mg total) by mouth daily. For blood pressure **NEEDS TO BE SEEN BEFORE NEXT REFILL**   atorvastatin  (LIPITOR) 20 MG tablet Take 1 tablet (20 mg total) by mouth daily. For cholesterol   cholecalciferol  (VITAMIN D3) 25 MCG (1000 UT) tablet Take 1,000 Units by mouth daily.   CRANBERRY FRUIT PO Take 1 tablet by mouth daily.   diclofenac  Sodium (VOLTAREN ) 1 % GEL APPLY 4 GRAMS TO AFFECTED AREA(S) 4 TIMES A DAY (Patient taking differently: Apply 4 g topically 4 (four) times daily as needed (Pain).)   ELIQUIS  5 MG TABS tablet Take 1 tablet (5 mg total) by mouth 2 (two) times daily. NEEDS TO SCHEDULE OV   Ferrous Sulfate  (IRON ) 325 (65 Fe) MG TABS Take 1 tablet (325 mg total) by mouth daily.   fluticasone  (FLONASE ) 50 MCG/ACT nasal spray PLACE 2 SPRAYS INTO EACH NOSTRIL ONCE A DAY   guaiFENesin -codeine  100-10 MG/5ML syrup Take 5 mLs by mouth 3 (three) times daily as needed for cough.   hydrALAZINE   (APRESOLINE ) 25 MG tablet Take 1 tablet (25 mg total) by mouth 2 (two) times daily.   Multiple Vitamins-Minerals (CENTRUM ADULT PO) Take 1 tablet by mouth daily.   Potassium Chloride  ER 20 MEQ TBCR Take 1 tablet (20 mEq total) by mouth daily. Take while taking hydrochlorothiazide /HCTZ   No current facility-administered medications for this visit. (Other)   REVIEW OF SYSTEMS:     ALLERGIES Allergies  Allergen Reactions   Lisinopril  Cough   PAST MEDICAL HISTORY Past Medical History:  Diagnosis Date   Arthritis    Cancer [facility])    Cataract    OU   Colon cancer (HCC)    Family history of pancreatic cancer 08/21/2020   Family history of uterine cancer 08/21/2020   H/O cesarean section    Hx of tonsillectomy    Hyperlipidemia    Hypertension    Kidney disease    Macular degeneration    Exu ARMD OU   Paroxysmal atrial fibrillation (HCC) 07/05/2020   Stroke (cerebrum) (HCC) 07/05/2020   Urinary retention 04/15/2020   Past Surgical History:  Procedure Laterality Date   BIOPSY  04/06/2020   Procedure: BIOPSY;  Surgeon: Golda Claudis PENNER, MD;  Location: [facility];  Service: Endoscopy;;   CARPAL TUNNEL RELEASE Right    CATARACT EXTRACTION W/PHACO Left 10/11/2019   Procedure: CATARACT EXTRACTION PHACO AND INTRAOCULAR LENS PLACEMENT (IOC);  Surgeon: Harrie Agent, MD;  Location: [facility];  Service: Ophthalmology;  Laterality: Left;  CDE: 8.56   CATARACT EXTRACTION W/PHACO Right 10/25/2019   Procedure: CATARACT EXTRACTION PHACO AND INTRAOCULAR LENS PLACEMENT (IOC);  Surgeon: Harrie Agent, MD;  Location: [facility];  Service: Ophthalmology;  Laterality: Right;  CDE: 5.67   CESAREAN SECTION     COLONOSCOPY N/A 04/06/2020   Procedure: COLONOSCOPY;  Surgeon: Golda Claudis PENNER, MD;  Location: [facility];  Service: Endoscopy;  Laterality: N/A;   CYSTOSCOPY WITH BIOPSY N/A 04/08/2020   Procedure: CYSTOSCOPY WITH BIOPSY;  Surgeon: Sherrilee Belvie CROME, MD;  Location: [facility];  Service: Urology;   Laterality: N/A;   ESOPHAGOGASTRODUODENOSCOPY N/A 04/05/2020   Procedure: ESOPHAGOGASTRODUODENOSCOPY (EGD);  Surgeon: Golda Claudis PENNER, MD;  [facility]: [facility];  Service: Endoscopy;  Laterality: N/A;   PARTIAL COLECTOMY N/A 04/08/2020   Procedure: PARTIAL COLECTOMY;  Surgeon: Kallie Manuelita BROCKS, MD;  Location: [facility];  Service: General;  Laterality: N/A;   PORTACATH PLACEMENT Left 05/18/2020   Procedure: INSERTION PORT-A-CATH (ATTACHED CATHETER IN LEFT SUBCLAVIAN);  Surgeon: Kallie Manuelita BROCKS, MD;  Location: [facility];  Service: General;  Laterality: Left;   TONSILLECTOMY     FAMILY HISTORY Family History  Problem Relation Age of Onset   Macular degeneration Mother    Stroke Mother    Hypertension Mother    Dementia Mother    Lung cancer Father        dx late 80s; smoking hx   Diabetes Sister    Hypertension Sister    Leukemia Paternal Uncle        d. 66s   Cancer Maternal Aunt        ovarian or endometrial dx 81s   Pancreatic cancer Paternal Uncle        d. late 36s   SOCIAL HISTORY Social History   Tobacco Use   Smoking status: Never   Smokeless tobacco: Never  Vaping Use   Vaping status: Never Used  Substance Use Topics   Alcohol use: No   Drug use: No       OPHTHALMIC EXAM: Not recorded    IMAGING AND PROCEDURES  Imaging and Procedures for 03/21/18           ASSESSMENT/PLAN:  No diagnosis found.   1. Exudative age-related macular degeneration, both eyes - severe exudative disease with very active CNVM OU at presentation in January 2019  - S/P IVA OD #1 (01.04.19), #2 (02.15.19)  - S/P IVA OS #1 (01.18.19), #2 (02.15.19)  - switched to Eylea  3.18.19 due to severity of disease  =============================================== - S/P IVE OD #1 (03.18.19), #2 (04.17.19), #3 (05.15.19), #4 (10.02.19),  #5 (01.15.20), #6 (06.04.20), #7 (10.29.20), #8 (01.21.21), #9 (05.27.21), #10 (07.22.21) -- injections held due to stable disciform scar, #11 (08.12.24) for  new IRH, #12 (09.25.24), #13 (11.06.24) - S/P IVE OS #1 (03.18.19), #2 (04.17.19), #3 (05.15.19), #4 (06.12.19), #5 (07.10.19), #6 (08.07.19), #7 (09.04.19), #8 (10.02.19), #9 (11.06.19), #10 (12.11.19), #11 (01.15.20), #12 (02.20.20), #13 (03.26.20), #14 (04.30.20), #15 (06.04.20), #16 (07.16.20), # 17 (08.20.20), #18 (09.24.20), #19 (10.29.20), #20 (12.03.20), #21 (01.21.21), #22 (03.18.21), #23 (05.27.21), #24 (07.22.21), #25 (11.11.21), #26 (12.09.21), #27 (01.06.22), #28 (2.7.22), #29 (03.24.22), #30 (4.28.22), #31 (05.31.22), #32 (06.30.22), #33 (08.04.22), #34 (09.15.22), #35 (10.27.22), #36 (12.08.22), #37 (01.12.23), #38 (02.16.23), #39 (03.30.23), #40 (05.11.23), #41 (06.22.23), #42 (07.27.23), #43 (09.01.23), #44 (10.06.23), #45 (11.10.23), #46 (12.15.23) -- IVE resistance ===================================================  - s/p IVV OS #1 (01.12.24 -- sample), #2 (02.09.24), #3 (03.15.24), #4 (04.19.24), #5 (05.24.24), #6 (07.01.24), #7 (08.12.24), #  8 (09.25.24), #9 (11.06.24), #10 (12.18.24), #11 (01.29.25), #12 (03.12.25), #13 (04.23.25) #14 (06.04.25)  - IVE OS held from 7.22.21 to 11.11.21 due to TIA/stroke on 8.1.21 - OCT OD: persistent cystic changes overlying stable central SRHM / disciform scar; OS: trace persistent cystic changes nasal fovea and macula overlying PED/SRHM at 6 weeks  - exam OD with focal IRH superior macula within atrophy -- improved  - BCVA OD stable at CF 3'; OS 20/60 from 20/70  - recommend IVV OS #15 today, 07.16.25 w/ f/u at 6 wks again - IVV authorization obtained and Good Days reinstated - will hold IVE OD today -- pt in agreement - RBA of procedure discussed, questions answered - IVV informed consent obtained and signed on 01.29.25 - see procedure note   - [employer] funding reinstated for Ms. Remick as of 03.12.25 visit - f/u in 6 weeks, sooner prn for DFE/OCT/possible injection(s)  2. PVD / vitreous syneresis  - Discussed findings and prognosis   - No  RT or RD on 360 exam  - Reviewed s/s of RT/RD  - strict return precautions for any such RT/RD symptoms  3,4. Hypertensive retinopathy OU - exam shows persistent focal IRH OD within GA - discussed importance of tight BP control - monitor  5. Pseudophakia OU  - s/p CE/IOL (Dr. Harrie, 11.2020)  - beautiful surgeries w/ IOLs in excellent position, doing well  - monitor  6. Ocular Hypertension OU  - IOP 16 OU  - continue cosopt  bid OU  7. Dry eyes OU - recommend artificial tears and lubricating ointment as needed  Ophthalmic Meds Ordered this visit:  No orders of the defined types were placed in this encounter.    No follow-ups on file.  There are no Patient Instructions on file for this visit.  This document serves as a record of services personally performed by Redell JUDITHANN Hans, MD, PhD. It was created on their behalf by Almetta Pesa, an ophthalmic technician. The creation of this record is the provider's dictation and/or activities during the visit.    Electronically signed by: Almetta Pesa, OA, 06/04/24  10:03 AM    Redell JUDITHANN Hans, M.D., Ph.D. Diseases & Surgery of the Retina and Vitreous [facility] 05/08/2024     Abbreviations: M myopia (nearsighted); A astigmatism; H hyperopia (farsighted); P presbyopia; Mrx spectacle prescription;  CTL contact lenses; OD right eye; OS left eye; OU both eyes  XT exotropia; ET esotropia; PEK punctate epithelial keratitis; PEE punctate epithelial erosions; DES dry eye syndrome; MGD meibomian gland dysfunction; ATs artificial tears; PFAT's preservative free artificial tears; NSC nuclear sclerotic cataract; PSC posterior subcapsular cataract; ERM epi-retinal membrane; PVD posterior vitreous detachment; RD retinal detachment; DM diabetes mellitus; DR diabetic retinopathy; NPDR non-proliferative diabetic retinopathy; PDR proliferative diabetic retinopathy; CSME clinically significant macular edema; DME diabetic  macular edema; dbh dot blot hemorrhages; CWS cotton wool spot; POAG primary open angle glaucoma; C/D cup-to-disc ratio; HVF humphrey visual field; GVF goldmann visual field; OCT optical coherence tomography; IOP intraocular pressure; BRVO Branch retinal vein occlusion; CRVO central retinal vein occlusion; CRAO central retinal artery occlusion; BRAO branch retinal artery occlusion; RT retinal tear; SB scleral buckle; PPV pars plana vitrectomy; VH Vitreous hemorrhage; PRP panretinal laser photocoagulation; IVK intravitreal kenalog; VMT vitreomacular traction; MH Macular hole;  NVD neovascularization of the disc; NVE neovascularization elsewhere; AREDS age related eye disease study; ARMD age related macular degeneration; POAG primary open angle glaucoma; EBMD epithelial/anterior basement membrane dystrophy; ACIOL anterior chamber  intraocular lens; IOL intraocular lens; PCIOL posterior chamber intraocular lens; Phaco/IOL phacoemulsification with intraocular lens placement; PRK photorefractive keratectomy; LASIK laser assisted in situ keratomileusis; HTN hypertension; DM diabetes mellitus; COPD chronic obstructive pulmonary disease

## 2024-06-13 NOTE — Progress Notes (Signed)
 [facility] - Clinic Note  06/19/2024    CHIEF COMPLAINT Patient presents for Retina Follow Up  HISTORY OF PRESENT ILLNESS: Gabrielle [name] is a 78 y.o. female who presents to the clinic today for:   HPI     Retina Follow Up   Patient presents with  Wet AMD.  In both eyes.  This started 6 weeks ago.  I, the attending physician,  performed the HPI with the patient and updated documentation appropriately.        Comments   Patient here for 6 weeks retina follow up for exu ARMD OU. Patient states vision about the same. No eye pain.      Last edited by Valdemar Rogue, MD on 06/19/2024  1:22 PM.    Pt states vision is about the same  Referring physician: Jolinda Norene HERO, DO [address] [place],  [place] [address]  HISTORICAL INFORMATION:   Selected notes from the MEDICAL RECORD NUMBER Referred by Dr. MICAEL Andreas for concern of ARMD OU   CURRENT MEDICATIONS: Current Outpatient Medications (Ophthalmic Drugs)  Medication Sig   dorzolamide -timolol  (COSOPT ) 2-0.5 % ophthalmic solution INSTILL ONE DROP IN THE RIGHT EYE TWICE DAILY   No current facility-administered medications for this visit. (Ophthalmic Drugs)   Current Outpatient Medications (Other)  Medication Sig   alendronate  (FOSAMAX ) 70 MG tablet Take 1 tablet (70 mg total) by mouth every 7 (seven) days. On an empty stomach with a full glass of water  **NEEDS TO BE SEEN BEFORE NEXT REFILL**   amLODipine  (NORVASC ) 10 MG tablet Take 1 tablet (10 mg total) by mouth daily. For blood pressure **NEEDS TO BE SEEN BEFORE NEXT REFILL**   atorvastatin  (LIPITOR) 20 MG tablet Take 1 tablet (20 mg total) by mouth daily. For cholesterol **NEEDS TO BE SEEN BEFORE NEXT REFILL**   cholecalciferol  (VITAMIN D3) 25 MCG (1000 UT) tablet Take 1,000 Units by mouth daily.   CRANBERRY FRUIT PO Take 1 tablet by mouth daily.   diclofenac  Sodium (VOLTAREN ) 1 % GEL APPLY 4 GRAMS TO AFFECTED AREA(S) 4 TIMES A DAY (Patient taking  differently: Apply 4 g topically 4 (four) times daily as needed (Pain).)   ELIQUIS  5 MG TABS tablet Take 1 tablet (5 mg total) by mouth 2 (two) times daily. NEEDS TO SCHEDULE OV   Ferrous Sulfate  (IRON ) 325 (65 Fe) MG TABS Take 1 tablet (325 mg total) by mouth daily.   fluticasone  (FLONASE ) 50 MCG/ACT nasal spray PLACE 2 SPRAYS INTO EACH NOSTRIL ONCE A DAY   guaiFENesin -codeine  100-10 MG/5ML syrup Take 5 mLs by mouth 3 (three) times daily as needed for cough.   hydrALAZINE  (APRESOLINE ) 25 MG tablet Take 1 tablet (25 mg total) by mouth 2 (two) times daily.   Multiple Vitamins-Minerals (CENTRUM ADULT PO) Take 1 tablet by mouth daily.   Potassium Chloride  ER 20 MEQ TBCR Take 1 tablet (20 mEq total) by mouth daily. Take while taking hydrochlorothiazide /HCTZ   No current facility-administered medications for this visit. (Other)   REVIEW OF SYSTEMS: ROS   Positive for: Gastrointestinal, Genitourinary, Musculoskeletal, Cardiovascular, Eyes Negative for: Constitutional, Neurological, Skin, HENT, Endocrine, Respiratory, Psychiatric, Allergic/Imm, Heme/Lymph Last edited by Orval Asberry RAMAN, COA on 06/19/2024  8:14 AM.     ALLERGIES Allergies  Allergen Reactions   Lisinopril  Cough   PAST MEDICAL HISTORY Past Medical History:  Diagnosis Date   Arthritis    Cancer [facility])    Cataract    OU   Colon cancer (HCC)  Family history of pancreatic cancer 08/21/2020   Family history of uterine cancer 08/21/2020   H/O cesarean section    Hx of tonsillectomy    Hyperlipidemia    Hypertension    Kidney disease    Macular degeneration    Exu ARMD OU   Paroxysmal atrial fibrillation (HCC) 07/05/2020   Stroke (cerebrum) (HCC) 07/05/2020   Urinary retention 04/15/2020   Past Surgical History:  Procedure Laterality Date   BIOPSY  04/06/2020   Procedure: BIOPSY;  Surgeon: Golda Claudis PENNER, MD;  [facility]: [facility];  Service: Endoscopy;;   CARPAL TUNNEL RELEASE Right    CATARACT EXTRACTION W/PHACO  Left 10/11/2019   Procedure: CATARACT EXTRACTION PHACO AND INTRAOCULAR LENS PLACEMENT (IOC);  Surgeon: Harrie Agent, MD;  Location: [facility];  Service: Ophthalmology;  Laterality: Left;  CDE: 8.56   CATARACT EXTRACTION W/PHACO Right 10/25/2019   Procedure: CATARACT EXTRACTION PHACO AND INTRAOCULAR LENS PLACEMENT (IOC);  Surgeon: Harrie Agent, MD;  Location: [facility];  Service: Ophthalmology;  Laterality: Right;  CDE: 5.67   CESAREAN SECTION     COLONOSCOPY N/A 04/06/2020   Procedure: COLONOSCOPY;  Surgeon: Golda Claudis PENNER, MD;  Location: [facility];  Service: Endoscopy;  Laterality: N/A;   CYSTOSCOPY WITH BIOPSY N/A 04/08/2020   Procedure: CYSTOSCOPY WITH BIOPSY;  Surgeon: Sherrilee Belvie CROME, MD;  Location: [facility];  Service: Urology;  Laterality: N/A;   ESOPHAGOGASTRODUODENOSCOPY N/A 04/05/2020   Procedure: ESOPHAGOGASTRODUODENOSCOPY (EGD);  Surgeon: Golda Claudis PENNER, MD;  [facility];  Service: Endoscopy;  Laterality: N/A;   PARTIAL COLECTOMY N/A 04/08/2020   Procedure: PARTIAL COLECTOMY;  Surgeon: Kallie Manuelita BROCKS, MD;  Location: [facility];  Service: General;  Laterality: N/A;   PORTACATH PLACEMENT Left 05/18/2020   Procedure: INSERTION PORT-A-CATH (ATTACHED CATHETER IN LEFT SUBCLAVIAN);  Surgeon: Kallie Manuelita BROCKS, MD;  Location: [facility];  Service: General;  Laterality: Left;   TONSILLECTOMY     FAMILY HISTORY Family History  Problem Relation Age of Onset   Macular degeneration Mother    Stroke Mother    Hypertension Mother    Dementia Mother    Lung cancer Father        dx late 29s; smoking hx   Diabetes Sister    Hypertension Sister    Leukemia Paternal Uncle        d. 67s   Cancer Maternal Aunt        ovarian or endometrial dx 80s   Pancreatic cancer Paternal Uncle        d. late 22s   SOCIAL HISTORY Social History   Tobacco Use   Smoking status: Never   Smokeless tobacco: Never  Vaping Use   Vaping status: Never Used  Substance Use Topics   Alcohol use: No    Drug use: No       OPHTHALMIC EXAM: Base Eye Exam     Visual Acuity (Snellen - Linear)       Right Left   Dist [place] CF at 3' 20/60         Tonometry (Tonopen, 8:03 AM)       Right Left   Pressure 17 14         Pupils       Dark Light Shape React APD   Right 3 2 Round Brisk None   Left 3 2 Round Brisk None         Visual Fields (Counting fingers)       Left Right  Full Full         Extraocular Movement       Right Left    Full, Ortho Full, Ortho         Neuro/Psych     Oriented x3: Yes   Mood/Affect: Normal         Dilation     Both eyes: 1.0% Mydriacyl, 2.5% Phenylephrine  @ 8:03 AM           Slit Lamp and Fundus Exam     Slit Lamp Exam       Right Left   Lids/Lashes Dermatochalasis - upper lid Dermatochalasis - upper lid   Conjunctiva/Sclera 1+ Injection Trace Injection, +mucus   Cornea Arcus, 3+ fine Punctpate epithelial erosions Arcus, trace Punctate epithelial erosions, mild tear film debris, trace endo pigment, high tear lake   Anterior Chamber Deep and quiet Deep and quiet   Iris Round and well dilated Round and well dilated   Lens PCIOL in good position, 1+ Posterior capsular opacification PC IOL in good position, trace PCO nasally   Anterior Vitreous Vitreous syneresis, Posterior vitreous detachment, vitreous condensations Vitreous syneresis, Posterior vitreous detachment, silicone oil micro drops         Fundus Exam       Right Left   Disc pink and sharp, compact mild pallor, sharp rim, Compact, vascular loops superiorly   C/D Ratio 0.2 0.2   Macula Blunted foveal reflex, large central GA, Drusen, Pigment clumping and atrophy, central thickening/pigmented disciform scar, +PED, persistent trace cystic changes overlying central scar, no heme Blunted foveal reflex, central thickening, RPE clumping and atrophy, Drusen, trace cystic changes overlying PED -- persistent, no frank heme   Vessels Vascular attenuation, Tortuous  Vascular attenuation, Tortuous   Periphery Attached, mild Reticular degeneration Attached, mild Reticular degeneration           Refraction     Wearing Rx       Sphere Cylinder Add   Right [place] Sphere +3.50   Left -0.50 Sphere +3.50    Type: PAL           IMAGING AND PROCEDURES  Imaging and Procedures for 03/21/18  OCT, Retina - OU - Both Eyes       Right Eye Quality was good. Central Foveal Thickness: 471. Progression has been stable. Findings include no SRF, abnormal foveal contour, retinal drusen , outer retinal tubulation, subretinal hyper-reflective material, disciform scar, epiretinal membrane, intraretinal fluid, pigment epithelial detachment, outer retinal atrophy (Persistent cystic changes overlying stable central SRHM / disciform scar).   Left Eye Quality was good. Central Foveal Thickness: 282. Progression has been stable. Findings include no SRF, abnormal foveal contour, retinal drusen , subretinal hyper-reflective material, intraretinal fluid, pigment epithelial detachment, outer retinal atrophy (Trace persistent cystic changes nasal fovea and macula overlying stable PED/SRHM).   Notes *Images captured and stored on drive  Diagnosis / Impression:  Exudative ARMD OU OD: persistent cystic changes overlying stable stable central SRHM / disciform scar  OS: trace persistent cystic changes nasal fovea and macula overlying PED/SRHM   Clinical management:  See below  Abbreviations: NFP - Normal foveal profile. CME - cystoid macular edema. PED - pigment epithelial detachment. IRF - intraretinal fluid. SRF - subretinal fluid. EZ - ellipsoid zone. ERM - epiretinal membrane. ORA - outer retinal atrophy. ORT - outer retinal tubulation. SRHM - subretinal hyper-reflective material      Intravitreal Injection, Pharmacologic Agent - OS - Left Eye  Time Out 06/19/2024. 8:51 AM. Confirmed correct patient, procedure, site, and patient consented.    Anesthesia Topical anesthesia was used. Anesthetic medications included Lidocaine  2%, Proparacaine 0.5%.   Procedure Preparation included 5% betadine  to ocular surface, eyelid speculum. A supplied (32g) needle was used.   Injection: 6 mg faricimab -svoa 6 MG/0.05ML   Route: Intravitreal, Site: Left Eye   NDC: 49757-903-93, Lot: [id number], Expiration date: 07/04/2025, Waste: 0 mL   Post-op Post injection exam found visual acuity of at least counting fingers. The patient tolerated the procedure well. There were no complications. The patient received written and verbal post procedure care education. Post injection medications were not given.            ASSESSMENT/PLAN:    ICD-10-CM   1. Exudative age-related macular degeneration of both eyes with active choroidal neovascularization (HCC)  H35.3231 OCT, Retina - OU - Both Eyes    Intravitreal Injection, Pharmacologic Agent - OS - Left Eye    faricimab -svoa (VABYSMO ) 6mg /0.70mL intravitreal injection    2. Posterior vitreous detachment of both eyes  H43.813     3. Essential hypertension  I10     4. Hypertensive retinopathy of both eyes  H35.033     5. Pseudophakia of both eyes  Z96.1     6. Ocular hypertension, bilateral  H40.053     7. Dry eyes  H04.123      1. Exudative age-related macular degeneration, both eyes - severe exudative disease with very active CNVM OU at presentation in January 2019  - S/P IVA OD #1 (01.04.19), #2 (02.15.19)  - S/P IVA OS #1 (01.18.19), #2 (02.15.19)  - switched to Eylea  3.18.19 due to severity of disease  =============================================== - S/P IVE OD #1 (03.18.19), #2 (04.17.19), #3 (05.15.19), #4 (10.02.19),  #5 (01.15.20), #6 (06.04.20), #7 (10.29.20), #8 (01.21.21), #9 (05.27.21), #10 (07.22.21) -- injections held due to stable disciform scar, #11 (08.12.24) for new IRH, #12 (09.25.24), #13 (11.06.24) - S/P IVE OS #1 (03.18.19), #2 (04.17.19), #3 (05.15.19), #4 (06.12.19),  #5 (07.10.19), #6 (08.07.19), #7 (09.04.19), #8 (10.02.19), #9 (11.06.19), #10 (12.11.19), #11 (01.15.20), #12 (02.20.20), #13 (03.26.20), #14 (04.30.20), #15 (06.04.20), #16 (07.16.20), # 17 (08.20.20), #18 (09.24.20), #19 (10.29.20), #20 (12.03.20), #21 (01.21.21), #22 (03.18.21), #23 (05.27.21), #24 (07.22.21), #25 (11.11.21), #26 (12.09.21), #27 (01.06.22), #28 (2.7.22), #29 (03.24.22), #30 (4.28.22), #31 (05.31.22), #32 (06.30.22), #33 (08.04.22), #34 (09.15.22), #35 (10.27.22), #36 (12.08.22), #37 (01.12.23), #38 (02.16.23), #39 (03.30.23), #40 (05.11.23), #41 (06.22.23), #42 (07.27.23), #43 (09.01.23), #44 (10.06.23), #45 (11.10.23), #46 (12.15.23) -- IVE resistance ===================================================  - s/p IVV OS #1 (01.12.24 -- sample), #2 (02.09.24), #3 (03.15.24), #4 (04.19.24), #5 (05.24.24), #6 (07.01.24), #7 (08.12.24), #8 (09.25.24), #9 (11.06.24), #10 (12.18.24), #11 (01.29.25), #12 (03.12.25), #13 (04.23.25) #14 (06.04.25)  - IVE OS held from 7.22.21 to 11.11.21 due to TIA/stroke on 8.1.21 - OCT OD: persistent cystic changes overlying stable central SRHM / disciform scar; OS: trace persistent cystic changes nasal fovea and macula overlying stable PED/SRHM at 6 weeks  - exam OD with focal IRH superior macula within atrophy -- improved  - BCVA OD stable at CF 3'; OS stable at 20/60   - recommend IVV OS #15 today, 07.16.25 w/ f/u extended to 6-7 wks - IVV authorization obtained and Good Days reinstated - will hold IVE OD today -- pt in agreement - RBA of procedure discussed, questions answered - IVV informed consent obtained and signed on 01.29.25 - see procedure note   - [employer] Days funding reinstated for  Ms. Overley as of 03.12.25 visit - f/u in 6-7 weeks, sooner prn for DFE/OCT/possible injection(s)  2. PVD / vitreous syneresis  - Discussed findings and prognosis   - No RT or RD on 360 exam  - Reviewed s/s of RT/RD  - strict return precautions for any such RT/RD  symptoms  3,4. Hypertensive retinopathy OU - exam shows persistent focal IRH OD within GA - discussed importance of tight BP control - monitor  5. Pseudophakia OU  - s/p CE/IOL (Dr. Harrie, 11.2020)  - beautiful surgeries w/ IOLs in excellent position, doing well  - monitor  6. Ocular Hypertension OU  - IOP 17,14  - continue cosopt  bid OU  7. Dry eyes OU - recommend artificial tears and lubricating ointment as needed  Ophthalmic Meds Ordered this visit:  Meds ordered this encounter  Medications   faricimab -svoa (VABYSMO ) 6mg /0.76mL intravitreal injection     Return for f/u 6-7 weeks, exu ARMD OU, DFE, OCT, Possible Injxn.  There are no Patient Instructions on file for this visit.  This document serves as a record of services personally performed by Redell JUDITHANN Hans, MD, PhD. It was created on their behalf by Almetta Pesa, an ophthalmic technician. The creation of this record is the provider's dictation and/or activities during the visit.    Electronically signed by: Almetta Pesa, OA, 06/19/24  1:22 PM  This document serves as a record of services personally performed by Redell JUDITHANN Hans, MD, PhD. It was created on their behalf by Alan PARAS. Delores, OA an ophthalmic technician. The creation of this record is the provider's dictation and/or activities during the visit.    Electronically signed by: Alan PARAS. Delores, OA 06/19/24 1:22 PM  Redell JUDITHANN Hans, M.D., Ph.D. Diseases & Surgery of the Retina and Vitreous [facility] 06/19/2024   I have reviewed the above documentation for accuracy and completeness, and I agree with the above. Redell JUDITHANN Hans, M.D., Ph.D. 06/19/24 1:24 PM   Abbreviations: M myopia (nearsighted); A astigmatism; H hyperopia (farsighted); P presbyopia; Mrx spectacle prescription;  CTL contact lenses; OD right eye; OS left eye; OU both eyes  XT exotropia; ET esotropia; PEK punctate epithelial keratitis; PEE punctate epithelial  erosions; DES dry eye syndrome; MGD meibomian gland dysfunction; ATs artificial tears; PFAT's preservative free artificial tears; NSC nuclear sclerotic cataract; PSC posterior subcapsular cataract; ERM epi-retinal membrane; PVD posterior vitreous detachment; RD retinal detachment; DM diabetes mellitus; DR diabetic retinopathy; NPDR non-proliferative diabetic retinopathy; PDR proliferative diabetic retinopathy; CSME clinically significant macular edema; DME diabetic macular edema; dbh dot blot hemorrhages; CWS cotton wool spot; POAG primary open angle glaucoma; C/D cup-to-disc ratio; HVF humphrey visual field; GVF goldmann visual field; OCT optical coherence tomography; IOP intraocular pressure; BRVO Branch retinal vein occlusion; CRVO central retinal vein occlusion; CRAO central retinal artery occlusion; BRAO branch retinal artery occlusion; RT retinal tear; SB scleral buckle; PPV pars plana vitrectomy; VH Vitreous hemorrhage; PRP panretinal laser photocoagulation; IVK intravitreal kenalog; VMT vitreomacular traction; MH Macular hole;  NVD neovascularization of the disc; NVE neovascularization elsewhere; AREDS age related eye disease study; ARMD age related macular degeneration; POAG primary open angle glaucoma; EBMD epithelial/anterior basement membrane dystrophy; ACIOL anterior chamber intraocular lens; IOL intraocular lens; PCIOL posterior chamber intraocular lens; Phaco/IOL phacoemulsification with intraocular lens placement; PRK photorefractive keratectomy; LASIK laser assisted in situ keratomileusis; HTN hypertension; DM diabetes mellitus; COPD chronic obstructive pulmonary disease

## 2024-06-14 ENCOUNTER — Other Ambulatory Visit: Payer: Self-pay | Admitting: Family Medicine

## 2024-06-14 DIAGNOSIS — I10 Essential (primary) hypertension: Secondary | ICD-10-CM

## 2024-06-14 DIAGNOSIS — E782 Mixed hyperlipidemia: Secondary | ICD-10-CM

## 2024-06-19 ENCOUNTER — Ambulatory Visit (INDEPENDENT_AMBULATORY_CARE_PROVIDER_SITE_OTHER): Admitting: Ophthalmology

## 2024-06-19 ENCOUNTER — Encounter (INDEPENDENT_AMBULATORY_CARE_PROVIDER_SITE_OTHER): Payer: Self-pay | Admitting: Ophthalmology

## 2024-06-19 DIAGNOSIS — H35033 Hypertensive retinopathy, bilateral: Secondary | ICD-10-CM | POA: Diagnosis not present

## 2024-06-19 DIAGNOSIS — H43813 Vitreous degeneration, bilateral: Secondary | ICD-10-CM

## 2024-06-19 DIAGNOSIS — H353231 Exudative age-related macular degeneration, bilateral, with active choroidal neovascularization: Secondary | ICD-10-CM

## 2024-06-19 DIAGNOSIS — I10 Essential (primary) hypertension: Secondary | ICD-10-CM | POA: Diagnosis not present

## 2024-06-19 DIAGNOSIS — H40053 Ocular hypertension, bilateral: Secondary | ICD-10-CM | POA: Diagnosis not present

## 2024-06-19 DIAGNOSIS — H04123 Dry eye syndrome of bilateral lacrimal glands: Secondary | ICD-10-CM

## 2024-06-19 DIAGNOSIS — Z961 Presence of intraocular lens: Secondary | ICD-10-CM

## 2024-06-19 MED ORDER — FARICIMAB-SVOA 6 MG/0.05ML IZ SOSY
6.0000 mg | PREFILLED_SYRINGE | INTRAVITREAL | Status: AC | PRN
  Administered 2024-06-19: 6 mg via INTRAVITREAL

## 2024-07-02 ENCOUNTER — Ambulatory Visit (HOSPITAL_COMMUNITY)
Admission: RE | Admit: 2024-07-02 | Discharge: 2024-07-02 | Disposition: A | Discharge status: Home / Self Care | Source: Ambulatory Visit | Attending: Hematology | Admitting: Hematology

## 2024-07-02 ENCOUNTER — Other Ambulatory Visit: Payer: Self-pay | Admitting: Family Medicine

## 2024-07-02 ENCOUNTER — Inpatient Hospital Stay: Attending: Hematology

## 2024-07-02 DIAGNOSIS — I700 Atherosclerosis of aorta: Secondary | ICD-10-CM | POA: Diagnosis not present

## 2024-07-02 DIAGNOSIS — Z79620 Long term (current) use of immunosuppressive biologic: Secondary | ICD-10-CM | POA: Diagnosis not present

## 2024-07-02 DIAGNOSIS — C182 Malignant neoplasm of ascending colon: Secondary | ICD-10-CM | POA: Insufficient documentation

## 2024-07-02 DIAGNOSIS — C189 Malignant neoplasm of colon, unspecified: Secondary | ICD-10-CM | POA: Insufficient documentation

## 2024-07-02 DIAGNOSIS — N3289 Other specified disorders of bladder: Secondary | ICD-10-CM | POA: Diagnosis not present

## 2024-07-02 DIAGNOSIS — K760 Fatty (change of) liver, not elsewhere classified: Secondary | ICD-10-CM | POA: Diagnosis not present

## 2024-07-02 LAB — COMPREHENSIVE METABOLIC PANEL WITH GFR
ALT: 20 U/L (ref 0–44)
AST: 23 U/L (ref 15–41)
Albumin: 4.4 g/dL (ref 3.5–5.0)
Alkaline Phosphatase: 88 U/L (ref 38–126)
Anion gap: 12 (ref 5–15)
BUN: 17 mg/dL (ref 8–23)
CO2: 27 mmol/L (ref 22–32)
Calcium: 9.6 mg/dL (ref 8.9–10.3)
Chloride: 97 mmol/L — ABNORMAL LOW (ref 98–111)
Creatinine, Ser: 1.06 mg/dL — ABNORMAL HIGH (ref 0.44–1.00)
GFR, Estimated: 54 mL/min — ABNORMAL LOW (ref >60)
Glucose, Bld: 105 mg/dL — ABNORMAL HIGH (ref 70–99)
Potassium: 3.3 mmol/L — ABNORMAL LOW (ref 3.5–5.1)
Sodium: 136 mmol/L (ref 135–145)
Total Bilirubin: 0.9 mg/dL (ref 0.0–1.2)
Total Protein: 8.1 g/dL (ref 6.5–8.1)

## 2024-07-02 LAB — CBC WITH DIFFERENTIAL/PLATELET
Abs Immature Granulocytes: 0.02 K/uL (ref 0.00–0.07)
Basophils Absolute: 0 K/uL (ref 0.0–0.1)
Basophils Relative: 1 %
Eosinophils Absolute: 0.2 K/uL (ref 0.0–0.5)
Eosinophils Relative: 3 %
HCT: 41.2 % (ref 36.0–46.0)
Hemoglobin: 13.7 g/dL (ref 12.0–15.0)
Immature Granulocytes: 0 %
Lymphocytes Relative: 24 %
Lymphs Abs: 1.5 K/uL (ref 0.7–4.0)
MCH: 30.7 pg (ref 26.0–34.0)
MCHC: 33.3 g/dL (ref 30.0–36.0)
MCV: 92.4 fL (ref 80.0–100.0)
Monocytes Absolute: 0.4 K/uL (ref 0.1–1.0)
Monocytes Relative: 7 %
Neutro Abs: 3.9 K/uL (ref 1.7–7.7)
Neutrophils Relative %: 65 %
Platelets: 153 K/uL (ref 150–400)
RBC: 4.46 MIL/uL (ref 3.87–5.11)
RDW: 13.2 % (ref 11.5–15.5)
WBC: 6 K/uL (ref 4.0–10.5)
nRBC: 0 % (ref 0.0–0.2)

## 2024-07-02 LAB — TSH: TSH: 1.593 u[IU]/mL (ref 0.350–4.500)

## 2024-07-02 MED ORDER — IOHEXOL 9 MG/ML PO SOLN
ORAL | Status: AC
  Filled 2024-07-02: qty 1000

## 2024-07-02 MED ORDER — SODIUM CHLORIDE 0.9% FLUSH
10.0000 mL | Freq: Once | INTRAVENOUS | Status: AC
  Administered 2024-07-02: 10 mL via INTRAVENOUS

## 2024-07-02 MED ORDER — HEPARIN SOD (PORK) LOCK FLUSH 100 UNIT/ML IV SOLN
500.0000 [IU] | Freq: Once | INTRAVENOUS | Status: AC
  Administered 2024-07-02: 500 [IU] via INTRAVENOUS

## 2024-07-02 MED ORDER — IOHEXOL 300 MG/ML  SOLN
100.0000 mL | Freq: Once | INTRAMUSCULAR | Status: AC | PRN
  Administered 2024-07-02: 100 mL via INTRAVENOUS

## 2024-07-02 NOTE — Progress Notes (Signed)
 Patients port flushed without difficulty.  No blood return noted with no bruising, swelling, redness, pain, or discomfort noted at site.  Per pt her port has not been flushed in 6 months and did not give blood return when she was getting treatment and that the MD is aware. Band aid applied.  Labs drawn per peripheral.  VSS with discharge and left in satisfactory condition with no s/s of distress noted. All follow ups as scheduled.   Gabrielle [name]

## 2024-07-03 LAB — CEA: CEA: 2.7 ng/mL (ref 0.0–4.7)

## 2024-07-09 ENCOUNTER — Inpatient Hospital Stay: Attending: Hematology | Admitting: Hematology

## 2024-07-09 VITALS — BP 158/72 | HR 74 | Temp 98.3°F | Resp 18 | Wt 174.6 lb

## 2024-07-09 DIAGNOSIS — R918 Other nonspecific abnormal finding of lung field: Secondary | ICD-10-CM | POA: Diagnosis not present

## 2024-07-09 DIAGNOSIS — Z806 Family history of leukemia: Secondary | ICD-10-CM | POA: Diagnosis not present

## 2024-07-09 DIAGNOSIS — C189 Malignant neoplasm of colon, unspecified: Secondary | ICD-10-CM | POA: Diagnosis not present

## 2024-07-09 DIAGNOSIS — E876 Hypokalemia: Secondary | ICD-10-CM | POA: Insufficient documentation

## 2024-07-09 DIAGNOSIS — C182 Malignant neoplasm of ascending colon: Secondary | ICD-10-CM | POA: Insufficient documentation

## 2024-07-09 DIAGNOSIS — N2889 Other specified disorders of kidney and ureter: Secondary | ICD-10-CM | POA: Diagnosis not present

## 2024-07-09 DIAGNOSIS — Z800 Family history of malignant neoplasm of digestive organs: Secondary | ICD-10-CM | POA: Diagnosis not present

## 2024-07-09 DIAGNOSIS — Z7901 Long term (current) use of anticoagulants: Secondary | ICD-10-CM | POA: Insufficient documentation

## 2024-07-09 DIAGNOSIS — Z808 Family history of malignant neoplasm of other organs or systems: Secondary | ICD-10-CM | POA: Diagnosis not present

## 2024-07-09 DIAGNOSIS — I480 Paroxysmal atrial fibrillation: Secondary | ICD-10-CM | POA: Insufficient documentation

## 2024-07-09 DIAGNOSIS — Z801 Family history of malignant neoplasm of trachea, bronchus and lung: Secondary | ICD-10-CM | POA: Diagnosis not present

## 2024-07-09 DIAGNOSIS — Z79899 Other long term (current) drug therapy: Secondary | ICD-10-CM | POA: Insufficient documentation

## 2024-07-09 DIAGNOSIS — N189 Chronic kidney disease, unspecified: Secondary | ICD-10-CM | POA: Diagnosis not present

## 2024-07-09 DIAGNOSIS — I129 Hypertensive chronic kidney disease with stage 1 through stage 4 chronic kidney disease, or unspecified chronic kidney disease: Secondary | ICD-10-CM | POA: Insufficient documentation

## 2024-07-09 DIAGNOSIS — Z809 Family history of malignant neoplasm, unspecified: Secondary | ICD-10-CM | POA: Diagnosis not present

## 2024-07-09 NOTE — Progress Notes (Signed)
 [facility] [address] [place] [address]    Clinic Day:  07/10/24    Referring physician: Jolinda Norene HERO, DO  Patient Care Team: Jolinda Norene HERO, DO as PCP - General (Family Medicine) Valdemar Rogue, MD as Consulting Physician (Retina Ophthalmology) Harrie Agent, MD as Consulting Physician (Ophthalmology) Harvey Margo CROME, MD (Inactive) as Consulting Physician (Gastroenterology) Rogers Hai, MD as Medical Oncologist (Oncology)   ASSESSMENT & PLAN:   Assessment: 1.  Stage IIIb (T4AN1A) poorly differentiated right colon adenocarcinoma: -Right hemicolectomy on 04/07/2020 with poorly differentiated adenocarcinoma, pT4a, positive radial margin, 1/15 lymph nodes positive, loss of MLH1 and PMS2, bladder biopsy negative for malignancy. -CTAP on 04/06/2020 showed circumferential ascending colon mass with numerous borderline enlarged pericolonic lymph nodes.  No findings of hepatic metastatic disease. -CEA on 04/04/2020 was 12.2.  CEA improved to 1.7 on 05/13/2020. -PET scan on 05/11/2020 shows mild FDG uptake associated with the anastomotic site.  Small pulmonary nodules that do not show elevated FDG activity, remain nonspecific, subcentimeter.  Persistent but improved bladder wall thickening. -Cycle 1 of dose reduced FOLFOX on 05/19/2020, followed by 2 hospitalizations, 1 from acute kidney injury from diarrhea and second admission for C. difficile colitis. -Cycle 2 of 5-FU and leucovorin  dose reduced on 06/30/2020.  Chemotherapy discontinued secondary to intolerance. -Follow-up CT scan on 11/02/2020 with multiple soft tissue lesions in the central and right mesentery measuring up to 3.1 x 1.7 cm.  Mild lymphadenopathy in the hepatic duodenal ligament, retroperitoneal space, right common iliac chain, bilateral external iliac chains.  17 mm soft tissue nodule in the lower anterior abdominal wall.  Bilateral tiny pulmonary nodules not substantially changed.  2.9 x 2.5  cm low-density lesion along the posterior uterus is indeterminate. -Pembrolizumab  started on 11/10/2020.  Last dose on 11/30/2023. -CT CAP from 02/09/2021 showed interval near complete resolution of previously demonstrated nodal mass in the ileocolonic mesentery.  Residual ill-defined nodule medial to the tip of the right hepatic lobe.  Stable small pulmonary nodules bilaterally consistent with benign findings.   2.  Family history: -Paternal uncle had colon cancer.  Maternal aunt had brain cancer and another maternal aunt had gynecological malignancy.  Father had lung cancer and was a smoker. - Genetic testing did not reveal any notable mutations.   3.  Diffuse erythema and nodularity of the dome of the bladder: -CT scan showed severely thickened bladder wall. -Cystoscopy on 04/08/2020 showed diffuse erythema, nodularity in the posterior wall tracking to the dome.  Ureteral orifices were in normal locations.  Biopsies were benign.    Plan: 1.  Stage IV right colon adenocarcinoma, MSI-high: - Last Keytruda  dose was on 11/30/2023.  We have stopped Keytruda  after discussion with the patient who opted for close monitoring. - Denies any abdominal pains or change in bowel habits.  She continues to be active and walks twice daily. - Reviewed labs from 07/02/2024: Mild CKD with creatinine 1.06 stable.  LFTs are normal.  CBC was normal.  CEA was 2.7.  TSH is 1.5. - Reviewed CT CAP from 07/02/2024: No evidence of metastatic disease in the chest, abdomen or pelvis.  Stable bilateral lung nodules.  10 mm exophytic lesion in the lower pole of the left kidney is mildly progressed to from 5 mm previously. - She does not have any evidence of recurrence.  If she does, we will consider restarting pembrolizumab . - We will closely follow the left kidney mass on subsequent scans.  RTC 6 months for  follow-up with repeat CT CAP with contrast and other labs including tumor marker.   2.  Paroxysmal atrial fibrillation: -  Continue Eliquis  indefinitely.  No bleeding issues reported.   3.  Hypertension: - Continue amlodipine  and Hyzaar.  Blood pressure is 160/76.  4.  Hypokalemia: - She stopped taking potassium.  Potassium is 3.3.  I have recommended that she start taking half tablet of 20 mEq potassium daily.    Orders Placed This Encounter  Procedures   CT CHEST ABDOMEN PELVIS W CONTRAST    Standing Status:   Future    Expected Date:   01/09/2025    Expiration Date:   04/09/2025    If indicated for the ordered procedure, I authorize the administration of contrast media per Radiology protocol:   Yes    Does the patient have a contrast media/X-ray dye allergy?:   No    Preferred imaging location?:   [facility]    Release to patient:   Immediate    If indicated for the ordered procedure, I authorize the administration of oral contrast media per Radiology protocol:   Yes   CBC with Differential/Platelet    Standing Status:   Future    Expected Date:   01/09/2025    Expiration Date:   04/09/2025    Release to patient:   Immediate   Comprehensive metabolic panel with GFR    Standing Status:   Future    Expected Date:   01/09/2025    Expiration Date:   04/09/2025    Release to patient:   Immediate   CEA    Standing Status:   Future    Expected Date:   01/09/2025    Expiration Date:   04/09/2025      LILLETTE Hummingbird R Teague,acting as a scribe for Alean Stands, MD.,have documented all relevant documentation on the behalf of Alean Stands, MD,as directed by  Alean Stands, MD while in the presence of Alean Stands, MD.  I, Alean Stands MD, have reviewed the above documentation for accuracy and completeness, and I agree with the above.     Alean Stands, MD   8/6/20258:52 AM  CHIEF COMPLAINT:   Diagnosis: right colon cancer    Cancer Staging  Malignant neoplasm of colon [facility]) Staging form: Colon and Rectum, AJCC 8th Edition - Clinical stage from 04/27/2020: Stage  IIIB (cT4a, cN1a, cM0) - Unsigned - Pathologic stage from 11/05/2020: Stage IVC (rpTX, rpN0, rpM1c) - Signed by Stands Alean, MD on 11/05/2020    Prior Therapy: Keytruda  every 3 weeks   Current Therapy: Observation   HISTORY OF PRESENT ILLNESS:   Oncology History  Malignant neoplasm of colon (HCC)  04/07/2020 Initial Diagnosis   Malignant neoplasm of ascending colon (HCC)   05/13/2020 Genetic Testing   [facility]     05/19/2020 - 07/02/2020 Chemotherapy   The patient had palonosetron  (ALOXI ) injection 0.25 mg, 0.25 mg, Intravenous,  Once, 2 of 12 cycles Administration: 0.25 mg (05/19/2020), 0.25 mg (06/30/2020) leucovorin  522 mg in dextrose  5 % 250 mL infusion, 320 mg/m2 = 522 mg (80 % of original dose 400 mg/m2), Intravenous,  Once, 2 of 12 cycles Dose modification: 320 mg/m2 (80 % of original dose 400 mg/m2, Cycle 1, Reason: Provider Judgment), 733.3332 mg/m2 (66.7 % of original dose 400 mg/m2, Cycle 2, Reason: Provider Judgment) Administration: 522 mg (05/19/2020), 434 mg (06/30/2020) oxaliplatin  (ELOXATIN ) 110 mg in dextrose  5 % 500 mL chemo infusion, 68 mg/m2 = 110 mg (80 % of  original dose 85 mg/m2), Intravenous,  Once, 1 of 1 cycle Dose modification: 68 mg/m2 (80 % of original dose 85 mg/m2, Cycle 1, Reason: Provider Judgment) Administration: 110 mg (05/19/2020) fluorouracil  (ADRUCIL ) chemo injection 500 mg, 320 mg/m2 = 500 mg (80 % of original dose 400 mg/m2), Intravenous,  Once, 2 of 12 cycles Dose modification: 320 mg/m2 (80 % of original dose 400 mg/m2, Cycle 1, Reason: Provider Judgment), [id number] mg/m2 (66.7 % of original dose 400 mg/m2, Cycle 2, Reason: Provider Judgment) Administration: 500 mg (05/19/2020), 450 mg (06/30/2020) fluorouracil  (ADRUCIL ) 3,150 mg in sodium chloride  0.9 % 87 mL chemo infusion, 1,920 mg/m2 = 3,150 mg (80 % of original dose 2,400 mg/m2), Intravenous, 1 Day/Dose, 2 of 12 cycles Dose modification: 1,920 mg/m2 (80 % of original dose 2,400 mg/m2,  Cycle 1, Reason: Provider Judgment), 1,600 mg/m2 (66.7 % of original dose 2,400 mg/m2, Cycle 2, Reason: Provider Judgment) Administration: 3,150 mg (05/19/2020), 2,600 mg (06/30/2020)  for chemotherapy treatment.    08/03/2020 Genetic Testing   [facility] Reveal Testing     08/14/2020 Genetic Testing   No pathogenic variants detected in [facility] Common Hereditary Cancers Panel.  Variant of uncertain significance (VUS) detected in HOXB13 at c.634G>A (p.Ala212Thr). The Common Hereditary Cancers Panel offered by [facility] includes sequencing and/or deletion duplication testing of the following 48 genes: APC, ATM, AXIN2, BARD1, BMPR1A, BRCA1, BRCA2, BRIP1, CDH1, CDK4, CDKN2A (p14ARF), CDKN2A (p16INK4a), CHEK2, CTNNA1, DICER1, EPCAM (Deletion/duplication testing only), FLCN, GREM1 (promoter region deletion/duplication testing only), KIT, MEN1, MLH1, MSH2, MSH3, MSH6, MUTYH, NBN, NF1, NHTL1, PALB2, PDGFRA, PMS2, POLD1, POLE, PTEN, RAD50, RAD51C, RAD51D, RNF43, SDHB, SDHC, SDHD, SMAD4, SMARCA4. STK11, TP53, TSC1, TSC2, and VHL.  The following genes were evaluated for sequence changes only: SDHA and HOXB13 c.251G>A variant only. The report date is August 14, 2020.    11/05/2020 Cancer Staging   Staging form: Colon and Rectum, AJCC 8th Edition - Pathologic stage from 11/05/2020: Stage IVC (rpTX, pN0, pM1c) - Signed by Rogers Hai, MD on 11/05/2020   11/10/2020 - 07/13/2022 Chemotherapy   Patient is on Treatment Plan : COLORECTAL Pembrolizumab  q21d     11/10/2020 -  Chemotherapy   Patient is on Treatment Plan : COLORECTAL Pembrolizumab  (200) q21d        INTERVAL HISTORY:   Gabrielle [name] is a 78 y.o. female presenting to clinic today for follow up of right colon cancer. She was last seen by me on 04/02/2024.  Since her last visit, she underwent CT CAP on 07/02/2024 that found: Stable exam. No new or progressive findings to suggest recurrent or metastatic disease in the chest, abdomen, or pelvis. Stable bilateral  pulmonary nodules. 10 mm exophytic low-density lesion lower pole left kidney is progressive in the interval from 4-5 mm previously. Circumferential bladder wall thickening is stable to mildly progressive in the interval. Hepatic steatosis. Aortic Atherosclerosis.   Today, she states that she is doing well overall. Her appetite level is at 100%. Her energy level is at 100%. Gabrielle [name] is accompanied by a family member. Her energy levels are great, which she partly contributes to taking daily walks.   She is no longer taking potassium supplements as her nephrologist reportedly recommended she discontinue the pill. Gabrielle [name] is taking Eliquis  and her antihypertensives as prescribed.   PAST MEDICAL HISTORY:   Past Medical History: Past Medical History:  Diagnosis Date   Arthritis    Cancer [facility])    Cataract    OU   Colon cancer (HCC)    Family history  of pancreatic cancer 08/21/2020   Family history of uterine cancer 08/21/2020   H/O cesarean section    Hx of tonsillectomy    Hyperlipidemia    Hypertension    Kidney disease    Macular degeneration    Exu ARMD OU   Paroxysmal atrial fibrillation (HCC) 07/05/2020   Stroke (cerebrum) (HCC) 07/05/2020   Urinary retention 04/15/2020    Surgical History: Past Surgical History:  Procedure Laterality Date   BIOPSY  04/06/2020   Procedure: BIOPSY;  Surgeon: Golda Claudis PENNER, MD;  Location: [facility];  Service: Endoscopy;;   CARPAL TUNNEL RELEASE Right    CATARACT EXTRACTION W/PHACO Left 10/11/2019   Procedure: CATARACT EXTRACTION PHACO AND INTRAOCULAR LENS PLACEMENT (IOC);  Surgeon: Harrie Agent, MD;  Location: [facility] ORS;  Service: Ophthalmology;  Laterality: Left;  CDE: 8.56   CATARACT EXTRACTION W/PHACO Right 10/25/2019   Procedure: CATARACT EXTRACTION PHACO AND INTRAOCULAR LENS PLACEMENT (IOC);  Surgeon: Harrie Agent, MD;  Location: [facility] ORS;  Service: Ophthalmology;  Laterality: Right;  CDE: 5.67   CESAREAN SECTION     COLONOSCOPY N/A  04/06/2020   Procedure: COLONOSCOPY;  Surgeon: Golda Claudis PENNER, MD;  Location: [facility];  Service: Endoscopy;  Laterality: N/A;   CYSTOSCOPY WITH BIOPSY N/A 04/08/2020   Procedure: CYSTOSCOPY WITH BIOPSY;  Surgeon: Sherrilee Belvie CROME, MD;  Location: [facility] ORS;  Service: Urology;  Laterality: N/A;   ESOPHAGOGASTRODUODENOSCOPY N/A 04/05/2020   Procedure: ESOPHAGOGASTRODUODENOSCOPY (EGD);  Surgeon: Golda Claudis PENNER, MD;  Location: [facility];  Service: Endoscopy;  Laterality: N/A;   PARTIAL COLECTOMY N/A 04/08/2020   Procedure: PARTIAL COLECTOMY;  Surgeon: Kallie Manuelita BROCKS, MD;  Location: [facility];  Service: General;  Laterality: N/A;   PORTACATH PLACEMENT Left 05/18/2020   Procedure: INSERTION PORT-A-CATH (ATTACHED CATHETER IN LEFT SUBCLAVIAN);  Surgeon: Kallie Manuelita BROCKS, MD;  Location: [facility];  Service: General;  Laterality: Left;   TONSILLECTOMY      Social History: Social History   Socioeconomic History   Marital status: Divorced    Spouse name: Not on file   Number of children: Not on file   Years of education: Not on file   Highest education level: Not on file  Occupational History   Not on file  Tobacco Use   Smoking status: Never   Smokeless tobacco: Never  Vaping Use   Vaping status: Never Used  Substance and Sexual Activity   Alcohol use: No   Drug use: No   Sexual activity: Not Currently  Other Topics Concern   Not on file  Social History Narrative   Not on file   Social Drivers of Health   Financial Resource Strain: Low Risk  (03/15/2024)   Overall Financial Resource Strain (CARDIA)    Difficulty of Paying Living Expenses: Not hard at all  Food Insecurity: No Food Insecurity (03/15/2024)   Hunger Vital Sign    Worried About Running Out of Food in the Last Year: Never true    Ran Out of Food in the Last Year: Never true  Transportation Needs: No Transportation Needs (03/15/2024)   PRAPARE - Administrator, Civil Service (Medical): No    Lack of  Transportation (Non-Medical): No  Physical Activity: Sufficiently Active (03/15/2024)   Exercise Vital Sign    Days of Exercise per Week: 7 days    Minutes of Exercise per Session: 30 min  Stress: No Stress Concern Present (03/15/2024)   [employer] of Occupational Health - Occupational Stress  Questionnaire    Feeling of Stress : Not at all  Social Connections: Moderately Isolated (03/15/2024)   Social Connection and Isolation Panel    Frequency of Communication with Friends and Family: More than three times a week    Frequency of Social Gatherings with Friends and Family: More than three times a week    Attends Religious Services: More than 4 times per year    Active Member of [employer] or Organizations: No    Attends Banker Meetings: Never    Marital Status: Widowed  Intimate Partner Violence: Not At Risk (03/15/2024)   Humiliation, Afraid, Rape, and Kick questionnaire    Fear of Current or Ex-Partner: No    Emotionally Abused: No    Physically Abused: No    Sexually Abused: No    Family History: Family History  Problem Relation Age of Onset   Macular degeneration Mother    Stroke Mother    Hypertension Mother    Dementia Mother    Lung cancer Father        dx late 79s; smoking hx   Diabetes Sister    Hypertension Sister    Leukemia Paternal Uncle        d. 91s   Cancer Maternal Aunt        ovarian or endometrial dx 32s   Pancreatic cancer Paternal Uncle        d. late 25s    Current Medications:  Current Outpatient Medications:    alendronate  (FOSAMAX ) 70 MG tablet, TAKE ONE TABLET EVERY 7 DAYS ON AN EMPTY STOMACH WITH A FULL GLASS OF WATER , Disp: 4 tablet, Rfl: 3   amLODipine  (NORVASC ) 10 MG tablet, Take 1 tablet (10 mg total) by mouth daily. For blood pressure **NEEDS TO BE SEEN BEFORE NEXT REFILL**, Disp: 30 tablet, Rfl: 0   atorvastatin  (LIPITOR) 20 MG tablet, Take 1 tablet (20 mg total) by mouth daily. For cholesterol **NEEDS TO BE SEEN BEFORE NEXT  REFILL**, Disp: 30 tablet, Rfl: 0   cholecalciferol  (VITAMIN D3) 25 MCG (1000 UT) tablet, Take 1,000 Units by mouth daily., Disp: , Rfl:    CRANBERRY FRUIT PO, Take 1 tablet by mouth daily., Disp: , Rfl:    diclofenac  Sodium (VOLTAREN ) 1 % GEL, APPLY 4 GRAMS TO AFFECTED AREA(S) 4 TIMES A DAY (Patient taking differently: Apply 4 g topically 4 (four) times daily as needed (Pain).), Disp: 400 g, Rfl: 2   dorzolamide -timolol  (COSOPT ) 2-0.5 % ophthalmic solution, INSTILL ONE DROP IN THE RIGHT EYE TWICE DAILY, Disp: 30 mL, Rfl: 11   ELIQUIS  5 MG TABS tablet, Take 1 tablet (5 mg total) by mouth 2 (two) times daily. NEEDS TO SCHEDULE OV, Disp: 180 tablet, Rfl: 0   Ferrous Sulfate  (IRON ) 325 (65 Fe) MG TABS, Take 1 tablet (325 mg total) by mouth daily., Disp: 90 tablet, Rfl: 3   fluticasone  (FLONASE ) 50 MCG/ACT nasal spray, PLACE 2 SPRAYS INTO EACH NOSTRIL ONCE A DAY, Disp: 16 g, Rfl: 6   guaiFENesin -codeine  100-10 MG/5ML syrup, Take 5 mLs by mouth 3 (three) times daily as needed for cough., Disp: 120 mL, Rfl: 0   hydrALAZINE  (APRESOLINE ) 25 MG tablet, Take 1 tablet (25 mg total) by mouth 2 (two) times daily., Disp: 180 tablet, Rfl: 3   Multiple Vitamins-Minerals (CENTRUM ADULT PO), Take 1 tablet by mouth daily., Disp: , Rfl:    Potassium Chloride  ER 20 MEQ TBCR, Take 1 tablet (20 mEq total) by mouth daily. Take while taking hydrochlorothiazide /HCTZ, Disp:  90 tablet, Rfl: 3   Allergies: Allergies  Allergen Reactions   Lisinopril  Cough    REVIEW OF SYSTEMS:   Review of Systems  Constitutional:  Negative for chills, fatigue and fever.  HENT:   Negative for lump/mass, mouth sores, nosebleeds, sore throat and trouble swallowing.   Eyes:  Negative for eye problems.  Respiratory:  Positive for cough. Negative for shortness of breath.   Cardiovascular:  Negative for chest pain, leg swelling and palpitations.  Gastrointestinal:  Negative for abdominal pain, constipation, diarrhea, nausea and vomiting.   Genitourinary:  Negative for bladder incontinence, difficulty urinating, dysuria, frequency, hematuria and nocturia.   Musculoskeletal:  Negative for arthralgias, back pain, flank pain, myalgias and neck pain.  Skin:  Negative for itching and rash.  Neurological:  Negative for dizziness, headaches and numbness.  Hematological:  Does not bruise/bleed easily.  Psychiatric/Behavioral:  Negative for depression, sleep disturbance and suicidal ideas. The patient is not nervous/anxious.   All other systems reviewed and are negative.    VITALS:   Blood pressure (!) 158/72, pulse 74, temperature 98.3 F (36.8 C), temperature source Oral, resp. rate 18, weight 174 lb 9.7 oz (79.2 kg), SpO2 94%.  Wt Readings from Last 3 Encounters:  07/09/24 174 lb 9.7 oz (79.2 kg)  07/02/24 176 lb (79.8 kg)  04/02/24 176 lb 9.4 oz (80.1 kg)    Body mass index is 31.94 kg/m.  Performance status (ECOG): 1 - Symptomatic but completely ambulatory  PHYSICAL EXAM:   Physical Exam Vitals and nursing note reviewed. Exam conducted with a chaperone present.  Constitutional:      Appearance: Normal appearance.  Cardiovascular:     Rate and Rhythm: Normal rate and regular rhythm.     Pulses: Normal pulses.     Heart sounds: Normal heart sounds.  Pulmonary:     Effort: Pulmonary effort is normal.     Breath sounds: Normal breath sounds.  Abdominal:     Palpations: Abdomen is soft. There is no hepatomegaly, splenomegaly or mass.     Tenderness: There is no abdominal tenderness.  Musculoskeletal:     Right lower leg: No edema.     Left lower leg: No edema.  Lymphadenopathy:     Cervical: No cervical adenopathy.     Right cervical: No superficial, deep or posterior cervical adenopathy.    Left cervical: No superficial, deep or posterior cervical adenopathy.     Upper Body:     Right upper body: No supraclavicular or axillary adenopathy.     Left upper body: No supraclavicular or axillary adenopathy.   Neurological:     General: No focal deficit present.     Mental Status: She is alert and oriented to person, place, and time.  Psychiatric:        Mood and Affect: Mood normal.        Behavior: Behavior normal.     LABS:      Latest Ref Rng & Units 07/02/2024   12:20 PM 03/25/2024   10:55 AM 12/25/2023   11:49 AM  CBC  WBC 4.0 - 10.5 K/uL 6.0  7.1  6.3   Hemoglobin 12.0 - 15.0 g/dL 86.2  87.6  87.6   Hematocrit 36.0 - 46.0 % 41.2  37.2  37.5   Platelets 150 - 400 K/uL 153  143  148       Latest Ref Rng & Units 07/02/2024   12:20 PM 03/25/2024   10:55 AM 12/25/2023   11:49 AM  CMP  Glucose 70 - 99 mg/dL 894  884  891   BUN 8 - 23 mg/dL 17  20  15    Creatinine 0.44 - 1.00 mg/dL 8.93  8.94  8.84   Sodium 135 - 145 mmol/L 136  139  139   Potassium 3.5 - 5.1 mmol/L 3.3  3.5  3.4   Chloride 98 - 111 mmol/L 97  101  100   CO2 22 - 32 mmol/L 27  28  28    Calcium  8.9 - 10.3 mg/dL 9.6  9.3  9.6   Total Protein 6.5 - 8.1 g/dL 8.1  7.7  7.9   Total Bilirubin 0.0 - 1.2 mg/dL 0.9  0.7  0.5   Alkaline Phos 38 - 126 U/L 88  88  80   AST 15 - 41 U/L 23  22  20    ALT 0 - 44 U/L 20  16  14       Lab Results  Component Value Date   CEA1 2.7 07/02/2024   /  CEA  Date Value Ref Range Status  07/02/2024 2.7 0.0 - 4.7 ng/mL Final    Comment:    (NOTE)                             Nonsmokers          <3.9                             Smokers             <5.6 Roche [employer]ostics Electrochemiluminescence Immunoassay (ECLIA) Values obtained with different assay methods or kits cannot be used interchangeably.  Results cannot be interpreted as absolute evidence of the presence or absence of malignant disease. Performed At: [facility] [address] [place], [place] [id number] Jennette Shorter MD Ey:[phone]    No results found for: PSA1 No results found for: CAN199 No results found for: CAN125  No results found for: STEPHANY CARLOTA BENSON MARKEL EARLA JOANNIE DOC, MSPIKE, SPEI Lab Results  Component Value Date   TIBC 371 03/01/2023   TIBC 396 05/13/2020   TIBC 395 05/13/2020   FERRITIN 52 03/01/2023   FERRITIN 65 05/13/2020   FERRITIN 64 05/13/2020   IRONPCTSAT 18 03/01/2023   IRONPCTSAT 23 05/13/2020   IRONPCTSAT 23 05/13/2020   No results found for: LDH   STUDIES:   CT CHEST ABDOMEN PELVIS W CONTRAST Result Date: 07/08/2024 CLINICAL DATA:  Colon cancer restaging.  SIRT body on EXAM: CT CHEST, ABDOMEN, AND PELVIS WITH CONTRAST TECHNIQUE: Multidetector CT imaging of the chest, abdomen and pelvis was performed following the standard protocol during bolus administration of intravenous contrast. RADIATION DOSE REDUCTION: This exam was performed according to the departmental dose-optimization program which includes automated exposure control, adjustment of the mA and/or kV according to patient size and/or use of iterative reconstruction technique. CONTRAST:  OMNIPAQUE  IOHEXOL  300 MG/ML  SOLN COMPARISON:  03/25/2024 FINDINGS: CT CHEST FINDINGS Cardiovascular: The heart size is normal. No substantial pericardial effusion. Coronary artery calcification is evident. Moderate atherosclerotic calcification is noted in the wall of the thoracic aorta. Left-sided Port-A-Cath tip is positioned in the distal left innominate vein near the confluence to the SVC. Mediastinum/Nodes: No mediastinal lymphadenopathy. There is no hilar lymphadenopathy. The esophagus has normal imaging features. There is no axillary lymphadenopathy. Lungs/Pleura: Scattered bilateral pulmonary nodules are identified. Index 8 mm  nodule in the right lower lobe identified previously is stable on 81/4 today. Index 7 mm left lower lobe pulmonary nodule identified on the previous study is 7 mm again today on 92/4. No new suspicious pulmonary nodule or mass on the current study. No focal airspace consolidation. No pleural effusion. Musculoskeletal: No worrisome lytic or  sclerotic osseous abnormality. CT ABDOMEN PELVIS FINDINGS Hepatobiliary: The liver shows diffusely decreased attenuation suggesting fat deposition. No suspicious focal abnormality within the liver parenchyma. There is no evidence for gallstones, gallbladder wall thickening, or pericholecystic fluid. No intrahepatic or extrahepatic biliary dilation. Pancreas: No focal mass lesion. No dilatation of the main duct. No intraparenchymal cyst. No peripancreatic edema. Spleen: Stable well-defined homogeneous low-density subcapsular lesion in the medial spleen, most compatible with benign etiology such as cyst or pseudocyst. Adrenals/Urinary Tract: No adrenal nodule or mass. Tiny well-defined homogeneous low-density lesions in the right kidney are too small to characterize but statistically most likely benign and probably cysts. No followup imaging is recommended. 10 mm exophytic lesion lower pole left kidney is progressive in the interval from 4-5 mm previously. No evidence for hydroureter. Circumferential bladder wall thickening is stable to mildly progressive in the interval. Stomach/Bowel: Stomach is unremarkable. No gastric wall thickening. No evidence of outlet obstruction. Duodenum is normally positioned as is the ligament of Treitz. Duodenal diverticulum noted. No small bowel wall thickening. No small bowel dilatation. Right hemicolectomy. No gross colonic mass. No colonic wall thickening. Vascular/Lymphatic: There is advanced atherosclerotic calcification of the abdominal aorta without aneurysm. 11 mm short axis portal caval node on 60/2 is similar to prior. No other abdominal lymphadenopathy evident. No pelvic sidewall lymphadenopathy. Reproductive: There is no adnexal mass. Other: No intraperitoneal free fluid. Musculoskeletal: No worrisome lytic or sclerotic osseous abnormality. IMPRESSION: 1. Stable exam. No new or progressive findings to suggest recurrent or metastatic disease in the chest, abdomen, or pelvis.  2. Stable bilateral pulmonary nodules. 3. 10 mm exophytic low-density lesion lower pole left kidney is progressive in the interval from 4-5 mm previously. Continued follow-up on restaging studies recommended. 4. Circumferential bladder wall thickening is stable to mildly progressive in the interval. 5. Hepatic steatosis. 6.  Aortic Atherosclerosis (ICD10-I70.0). Electronically Signed   By: Camellia Candle M.D.   On: 07/08/2024 11:59   Intravitreal Injection, Pharmacologic Agent - OS - Left Eye Result Date: 06/19/2024 Time Out 06/19/2024. 8:51 AM. Confirmed correct patient, procedure, site, and patient consented. Anesthesia Topical anesthesia was used. Anesthetic medications included Lidocaine  2%, Proparacaine 0.5%. Procedure Preparation included 5% betadine  to ocular surface, eyelid speculum. A supplied (32g) needle was used. Injection: 6 mg faricimab -svoa 6 MG/0.05ML   Route: Intravitreal, Site: Left Eye   NDC: 49757-903-93, Lot: [id number], Expiration date: 07/04/2025, Waste: 0 mL Post-op Post injection exam found visual acuity of at least counting fingers. The patient tolerated the procedure well. There were no complications. The patient received written and verbal post procedure care education. Post injection medications were not given.   OCT, Retina - OU - Both Eyes Result Date: 06/19/2024 Right Eye Quality was good. Central Foveal Thickness: 471. Progression has been stable. Findings include no SRF, abnormal foveal contour, retinal drusen , outer retinal tubulation, subretinal hyper-reflective material, disciform scar, epiretinal membrane, intraretinal fluid, pigment epithelial detachment, outer retinal atrophy (Persistent cystic changes overlying stable central SRHM / disciform scar). Left Eye Quality was good. Central Foveal Thickness: 282. Progression has been stable. Findings include no SRF, abnormal foveal contour, retinal drusen , subretinal hyper-reflective material, intraretinal fluid, pigment  epithelial detachment, outer retinal atrophy (Trace persistent cystic changes nasal fovea and macula overlying stable PED/SRHM). Notes *Images captured and stored on drive Diagnosis / Impression: Exudative ARMD OU OD: persistent cystic changes overlying stable stable central SRHM / disciform scar OS: trace persistent cystic changes nasal fovea and macula overlying PED/SRHM Clinical management: See below Abbreviations: NFP - Normal foveal profile. CME - cystoid macular edema. PED - pigment epithelial detachment. IRF - intraretinal fluid. SRF - subretinal fluid. EZ - ellipsoid zone. ERM - epiretinal membrane. ORA - outer retinal atrophy. ORT - outer retinal tubulation. SRHM - subretinal hyper-reflective material

## 2024-07-09 NOTE — Patient Instructions (Addendum)
 [facility]  Discharge Instructions  You were seen and examined today by Dr. Rogers.  Dr. Rogers discussed your most recent lab work and CT scan which revealed that everything looks good and stable. Small spot on kidney has grown slightly but everything else is stable.   Start taking a 1/2 tablet of potassium since your potassium was slightly low. Dr. Katragadda will continue to monitor you with scans.  Follow-up as scheduled.    Thank you for choosing [facility] to provide your oncology and hematology care.   To afford each patient quality time with our provider, please arrive at least 15 minutes before your scheduled appointment time. You may need to reschedule your appointment if you arrive late (10 or more minutes). Arriving late affects you and other patients whose appointments are after yours.  Also, if you miss three or more appointments without notifying the office, you may be dismissed from the clinic at the provider's discretion.    Again, thank you for choosing [facility].  Our hope is that these requests will decrease the amount of time that you wait before being seen by our physicians.   If you have a lab appointment with the Cancer Center - please note that after April 8th, all labs will be drawn in the cancer center.  You do not have to check in or register with the main entrance as you have in the past but will complete your check-in at the cancer center.            _____________________________________________________________  Should you have questions after your visit to [facility], please contact our office at [phone] and follow the prompts.  Our office hours are 8:00 a.m. to 4:30 p.m. Monday - Thursday and 8:00 a.m. to 2:30 p.m. Friday.  Please note that voicemails left after 4:00 p.m. may not be returned until the following business day.  We are closed weekends and all major  holidays.  You do have access to a nurse 24-7, just call the main number to the clinic [phone] and do not press any options, hold on the line and a nurse will answer the phone.    For prescription refill requests, have your pharmacy contact our office and allow 72 hours.    Masks are no longer required in the cancer centers. If you would like for your care team to wear a mask while they are taking care of you, please let them know. You may have one support person who is at least 78 years old accompany you for your appointments.

## 2024-07-10 ENCOUNTER — Encounter: Payer: Self-pay | Admitting: Hematology

## 2024-07-12 ENCOUNTER — Other Ambulatory Visit: Payer: Self-pay | Admitting: Family Medicine

## 2024-07-12 DIAGNOSIS — I10 Essential (primary) hypertension: Secondary | ICD-10-CM

## 2024-07-20 ENCOUNTER — Other Ambulatory Visit: Payer: Self-pay | Admitting: Family Medicine

## 2024-07-20 DIAGNOSIS — I480 Paroxysmal atrial fibrillation: Secondary | ICD-10-CM

## 2024-07-20 DIAGNOSIS — I10 Essential (primary) hypertension: Secondary | ICD-10-CM

## 2024-07-25 NOTE — Progress Notes (Signed)
 [facility] - Clinic Note  07/31/2024    CHIEF COMPLAINT Patient presents for Retina Follow Up  HISTORY OF PRESENT ILLNESS: Gabrielle [name] is a 78 y.o. female who presents to the clinic today for:   HPI     Retina Follow Up   Patient presents with  Wet AMD.  In both eyes.  Severity is moderate.  Duration of 6 weeks.  Since onset it is stable.  I, the attending physician,  performed the HPI with the patient and updated documentation appropriately.        Comments   Pt here for 6 wk ret f/u exu ARMD OU. Pt states VA is unchanged. Pt started a potassium pill per her cancer doctor.       Last edited by Valdemar Rogue, MD on 07/31/2024 12:19 PM.     Pt states everything is doing well. She had a good report from the cancer doctor.   Referring physician: Jolinda Norene HERO, DO [address] [place],  [place] [address]  HISTORICAL INFORMATION:   Selected notes from the MEDICAL RECORD NUMBER Referred by Dr. MICAEL Andreas for concern of ARMD OU   CURRENT MEDICATIONS: Current Outpatient Medications (Ophthalmic Drugs)  Medication Sig   dorzolamide -timolol  (COSOPT ) 2-0.5 % ophthalmic solution INSTILL ONE DROP IN THE RIGHT EYE TWICE DAILY   No current facility-administered medications for this visit. (Ophthalmic Drugs)   Current Outpatient Medications (Other)  Medication Sig   alendronate  (FOSAMAX ) 70 MG tablet TAKE ONE TABLET EVERY 7 DAYS ON AN EMPTY STOMACH WITH A FULL GLASS OF WATER    amLODipine  (NORVASC ) 10 MG tablet TAKE ONE TABLET DAILY FOR BLOOD PRESSURE   atorvastatin  (LIPITOR) 20 MG tablet Take 1 tablet (20 mg total) by mouth daily.   cholecalciferol  (VITAMIN D3) 25 MCG (1000 UT) tablet Take 1,000 Units by mouth daily.   CRANBERRY FRUIT PO Take 1 tablet by mouth daily.   diclofenac  Sodium (VOLTAREN ) 1 % GEL APPLY 4 GRAMS TO AFFECTED AREA(S) 4 TIMES A DAY   ELIQUIS  5 MG TABS tablet TAKE ONE TABLET TWICE DAILY   Ferrous Sulfate  (IRON ) 325 (65 Fe) MG TABS Take 1  tablet (325 mg total) by mouth daily.   fluticasone  (FLONASE ) 50 MCG/ACT nasal spray PLACE 2 SPRAYS INTO EACH NOSTRIL ONCE A DAY   guaiFENesin -codeine  100-10 MG/5ML syrup Take 5 mLs by mouth 3 (three) times daily as needed for cough.   hydrALAZINE  (APRESOLINE ) 25 MG tablet TAKE ONE TABLET TWICE DAILY   Multiple Vitamins-Minerals (CENTRUM ADULT PO) Take 1 tablet by mouth daily.   Potassium Chloride  ER 20 MEQ TBCR Take 1 tablet (20 mEq total) by mouth daily. Take while taking hydrochlorothiazide /HCTZ   No current facility-administered medications for this visit. (Other)   REVIEW OF SYSTEMS: ROS   Positive for: Gastrointestinal, Genitourinary, Musculoskeletal, Cardiovascular, Eyes Negative for: Constitutional, Neurological, Skin, HENT, Endocrine, Respiratory, Psychiatric, Allergic/Imm, Heme/Lymph Last edited by Antonetta Almetta BRAVO, COT on 07/31/2024  7:54 AM.      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/2020   Urinary retention 04/15/2020   Past Surgical History:  Procedure Laterality Date   BIOPSY  04/06/2020   Procedure: BIOPSY;  Surgeon: Golda Claudis PENNER, MD;  [facility];  Service: Endoscopy;;   CARPAL TUNNEL RELEASE Right    CATARACT EXTRACTION W/PHACO Left 10/11/2019   Procedure: CATARACT EXTRACTION PHACO AND INTRAOCULAR LENS PLACEMENT (IOC);  Surgeon: Harrie Agent, MD;  Location: [facility] ORS;  Service: Ophthalmology;  Laterality: Left;  CDE: 8.56   CATARACT EXTRACTION W/PHACO Right 10/25/2019    Procedure: CATARACT EXTRACTION PHACO AND INTRAOCULAR LENS PLACEMENT (IOC);  Surgeon: Harrie Agent, MD;  Location: [facility] OR[facility];  Service: Ophthalmology;  Laterality: Right;  CDE: 5.67   CESAREAN SECTION     COLONOSCOPY N/A 04/06/2020   Procedure: COLONOSCOPY;  Surgeon: Golda Claudis PENNER, MD;  Location: [facility];  Service: Endoscopy;  Laterality: N/A;   CYSTOSCOPY WITH BIOPSY N/A 04/08/2020   Procedure: CYSTOSCOPY WITH BIOPSY;  Surgeon: Sherrilee Belvie CROME, MD;  Location: [facility];  Service: Urology;  Laterality: N/A;   ESOPHAGOGASTRODUODENOSCOPY N/A 04/05/2020   Procedure: ESOPHAGOGASTRODUODENOSCOPY (EGD);  Surgeon: Golda Claudis PENNER, MD;  [facility];  Service: Endoscopy;  Laterality: N/A;   PARTIAL COLECTOMY N/A 04/08/2020   Procedure: PARTIAL COLECTOMY;  Surgeon: Kallie Manuelita BROCKS, MD;  Location: [facility];  Service: General;  Laterality: N/A;   PORTACATH PLACEMENT Left 05/18/2020   Procedure: INSERTION PORT-A-CATH (ATTACHED CATHETER IN LEFT SUBCLAVIAN);  Surgeon: Kallie Manuelita BROCKS, MD;  Location: [facility];  Service: General;  Laterality: Left;   TONSILLECTOMY     FAMILY HISTORY Family History  Problem Relation Age of Onset   Macular degeneration Mother    Stroke Mother    Hypertension Mother    Dementia Mother    Lung cancer Father        dx late 68s; smoking hx   Diabetes Sister    Hypertension Sister    Leukemia Paternal Uncle        d. 84s   Cancer Maternal Aunt        ovarian or endometrial dx 93s   Pancreatic cancer Paternal Uncle        d. late 31s   SOCIAL HISTORY Social History   Tobacco Use   Smoking status: Never   Smokeless tobacco: Never  Vaping Use   Vaping status: Never Used  Substance Use Topics   Alcohol use: No   Drug use: No       OPHTHALMIC EXAM: Base Eye Exam     Visual Acuity (Snellen - Linear)       Right Left   Dist cc CF at 3' 20/60   Dist ph cc NI NI    Correction: Glasses         Tonometry (Tonopen, 7:58 AM)       Right Left    Pressure 16 13         Pupils       Pupils Dark Light Shape React APD   Right PERRL 3 2 Round Brisk None   Left PERRL 3 2 Round Brisk None         Visual Fields (Counting fingers)       Left Right    Full Full         Extraocular Movement       Right Left    Full,  Ortho Full, Ortho         Neuro/Psych     Oriented x3: Yes   Mood/Affect: Normal         Dilation     Both eyes: 1.0% Mydriacyl, 2.5% Phenylephrine  @ 7:58 AM           Slit Lamp and Fundus Exam     Slit Lamp Exam       Right Left   Lids/Lashes Dermatochalasis - upper lid Dermatochalasis - upper lid   Conjunctiva/Sclera 1+ Injection Trace Injection, +mucus   Cornea Arcus, 3+ fine Punctpate epithelial erosions Arcus, trace Punctate epithelial erosions, mild tear film debris, trace endo pigment, high tear lake   Anterior Chamber Deep and quiet Deep and quiet   Iris Round and well dilated Round and well dilated   Lens PCIOL in good position, 1+ Posterior capsular opacification PC IOL in good position, trace PCO nasally   Anterior Vitreous Vitreous syneresis, Posterior vitreous detachment, vitreous condensations Vitreous syneresis, Posterior vitreous detachment, silicone oil micro drops         Fundus Exam       Right Left   Disc pink and sharp, compact mild pallor, sharp rim, Compact, vascular loops superiorly   C/D Ratio 0.2 0.2   Macula Blunted foveal reflex, large central GA, Drusen, Pigment clumping and atrophy, central thickening/pigmented disciform scar, +PED, persistent trace cystic changes overlying central scar, no heme Blunted foveal reflex, central thickening, RPE clumping and atrophy, Drusen, trace cystic changes overlying PED -- persistent, no frank heme   Vessels Vascular attenuation, Tortuous Vascular attenuation, Tortuous   Periphery Attached, mild Reticular degeneration Attached, mild Reticular degeneration           Refraction     Wearing Rx       Sphere Cylinder  Add   Right [place] Sphere +3.50   Left -0.50 Sphere +3.50    Type: PAL           IMAGING AND PROCEDURES  Imaging and Procedures for 03/21/18  OCT, Retina - OU - Both Eyes       Right Eye Quality was good. Central Foveal Thickness: 450. Progression has been stable. Findings include no SRF, abnormal foveal contour, retinal drusen , outer retinal tubulation, subretinal hyper-reflective material, disciform scar, epiretinal membrane, intraretinal fluid, pigment epithelial detachment, outer retinal atrophy (Persistent cystic changes overlying stable central SRHM / disciform scar).   Left Eye Quality was good. Central Foveal Thickness: 325. Progression has been stable. Findings include no SRF, abnormal foveal contour, retinal drusen , subretinal hyper-reflective material, intraretinal fluid, pigment epithelial detachment, outer retinal atrophy (Trace persistent cystic changes nasal fovea and macula overlying stable PED/SRHM).   Notes *Images captured and stored on drive  Diagnosis / Impression:  Exudative ARMD OU OD: persistent cystic changes overlying stable central SRHM / disciform scar  OS: trace persistent cystic changes nasal fovea and macula overlying PED/SRHM   Clinical management:  See below  Abbreviations: NFP - Normal foveal profile. CME - cystoid macular edema. PED - pigment epithelial detachment. IRF - intraretinal fluid. SRF - subretinal fluid. EZ - ellipsoid zone. ERM - epiretinal membrane. ORA - outer retinal atrophy. ORT - outer retinal tubulation. SRHM - subretinal hyper-reflective material      Intravitreal Injection, Pharmacologic Agent - OS - Left Eye       Time Out 07/31/2024. 8:18 AM. Confirmed correct patient, procedure, site, and patient consented.   Anesthesia Topical anesthesia was used. Anesthetic medications included Lidocaine  2%,  Proparacaine 0.5%.   Procedure Preparation included 5% betadine  to ocular surface, eyelid speculum. A supplied (32g)  needle was used.   Injection: 6 mg faricimab -svoa 6 MG/0.05ML   Route: Intravitreal, Site: Left Eye   NDC: 49757-903-93, Lot: [id number], Expiration date: 07/04/2025, Waste: 0 mL   Post-op Post injection exam found visual acuity of at least counting fingers. The patient tolerated the procedure well. There were no complications. The patient received written and verbal post procedure care education. Post injection medications were not given.            ASSESSMENT/PLAN:    ICD-10-CM   1. Exudative age-related macular degeneration of both eyes with active choroidal neovascularization (HCC)  H35.3231 OCT, Retina - OU - Both Eyes    Intravitreal Injection, Pharmacologic Agent - OS - Left Eye    faricimab -svoa (VABYSMO ) 6mg /0.20mL intravitreal injection    2. Posterior vitreous detachment of both eyes  H43.813     3. Essential hypertension  I10     4. Hypertensive retinopathy of both eyes  H35.033     5. Pseudophakia of both eyes  Z96.1     6. Ocular hypertension, bilateral  H40.053     7. Dry eyes  H04.123      1. Exudative age-related macular degeneration, both eyes - severe exudative disease with very active CNVM OU at presentation in January 2019  - S/P IVA OD #1 (01.04.19), #2 (02.15.19)  - S/P IVA OS #1 (01.18.19), #2 (02.15.19)  - switched to Eylea  3.18.19 due to severity of disease  =============================================== - S/P IVE OD #1 (03.18.19), #2 (04.17.19), #3 (05.15.19), #4 (10.02.19),  #5 (01.15.20), #6 (06.04.20), #7 (10.29.20), #8 (01.21.21), #9 (05.27.21), #10 (07.22.21) -- injections held due to stable disciform scar, #11 (08.12.24) for new IRH, #12 (09.25.24), #13 (11.06.24) - S/P IVE OS #1 (03.18.19), #2 (04.17.19), #3 (05.15.19), #4 (06.12.19), #5 (07.10.19), #6 (08.07.19), #7 (09.04.19), #8 (10.02.19), #9 (11.06.19), #10 (12.11.19), #11 (01.15.20), #12 (02.20.20), #13 (03.26.20), #14 (04.30.20), #15 (06.04.20), #16 (07.16.20), # 17 (08.20.20), #18  (09.24.20), #19 (10.29.20), #20 (12.03.20), #21 (01.21.21), #22 (03.18.21), #23 (05.27.21), #24 (07.22.21), #25 (11.11.21), #26 (12.09.21), #27 (01.06.22), #28 (2.7.22), #29 (03.24.22), #30 (4.28.22), #31 (05.31.22), #32 (06.30.22), #33 (08.04.22), #34 (09.15.22), #35 (10.27.22), #36 (12.08.22), #37 (01.12.23), #38 (02.16.23), #39 (03.30.23), #40 (05.11.23), #41 (06.22.23), #42 (07.27.23), #43 (09.01.23), #44 (10.06.23), #45 (11.10.23), #46 (12.15.23) -- IVE resistance ================================================ - s/p IVV OS #1 (01.12.24 -- sample), #2 (02.09.24), #3 (03.15.24), #4 (04.19.24), #5 (05.24.24), #6 (07.01.24), #7 (08.12.24), #8 (09.25.24), #9 (11.06.24), #10 (12.18.24), #11 (01.29.25), #12 (03.12.25), #13 (04.23.25) #14 (06.04.25), #15 (07.16.25)  - IVE OS held from 7.22.21 to 11.11.21 due to TIA/stroke on 8.1.21 - OCT OD: persistent cystic changes overlying stable central SRHM / disciform scar; OS: trace persistent cystic changes nasal fovea and macula overlying stable PED/SRHM at 6 weeks - exam OD with focal IRH superior macula within atrophy -- improved  - BCVA OD stable at CF 3'; OS stable at 20/60  - recommend IVV OS #16 today, 08.27.25 w/ f/u extended to 6-7 wks - IVV authorization obtained and Good Days reinstated - will hold IVE OD today -- pt in agreement - RBA of procedure discussed, questions answered - IVV informed consent obtained and signed on 01.29.25 - see procedure note   - [employer] funding reinstated for Ms. Winch as of 03.12.25 visit - f/u in 6-7 weeks, sooner prn for DFE/OCT/possible injection(s)  2. PVD / vitreous syneresis  - Discussed findings  and prognosis   - No RT or RD on 360 exam  - Reviewed s/s of RT/RD  - strict return precautions for any such RT/RD symptoms  3,4. Hypertensive retinopathy OU - exam shows persistent focal IRH OD within GA - discussed importance of tight BP control - monitor  5. Pseudophakia OU  - s/p CE/IOL (Dr. Harrie,  11.2020)  - beautiful surgeries w/ IOLs in excellent position, doing well  - monitor  6. Ocular Hypertension OU  - IOP 17,14  - continue cosopt  bid OU  7. Dry eyes OU - recommend artificial tears and lubricating ointment as needed  Ophthalmic Meds Ordered this visit:  Meds ordered this encounter  Medications   faricimab -svoa (VABYSMO ) 6mg /0.55mL intravitreal injection     Return in about 7 weeks (around 09/18/2024) for f/u Ex. AMD OU , DFE, OCT, Possible, IVV, OS.  There are no Patient Instructions on file for this visit.  This document serves as a record of services personally performed by Redell JUDITHANN Hans, MD, PhD. It was created on their behalf by Almetta Pesa, an ophthalmic technician. The creation of this record is the provider's dictation and/or activities during the visit.    Electronically signed by: Almetta Pesa, OA, 07/31/24  6:43 PM  This document serves as a record of services personally performed by Redell JUDITHANN Hans, MD, PhD. It was created on their behalf by Wanda GEANNIE Keens, COT an ophthalmic technician. The creation of this record is the provider's dictation and/or activities during the visit.    Electronically signed by:  Wanda GEANNIE Keens, COT  07/31/24 6:43 PM  Redell JUDITHANN Hans, M.D., Ph.D. Diseases & Surgery of the Retina and Vitreous [facility]  I have reviewed the above documentation for accuracy and completeness, and I agree with the above. Redell JUDITHANN Hans, M.D., Ph.D. 07/31/24 6:45 PM    Abbreviations: M myopia (nearsighted); A astigmatism; H hyperopia (farsighted); P presbyopia; Mrx spectacle prescription;  CTL contact lenses; OD right eye; OS left eye; OU both eyes  XT exotropia; ET esotropia; PEK punctate epithelial keratitis; PEE punctate epithelial erosions; DES dry eye syndrome; MGD meibomian gland dysfunction; ATs artificial tears; PFAT's preservative free artificial tears; NSC nuclear sclerotic cataract; PSC  posterior subcapsular cataract; ERM epi-retinal membrane; PVD posterior vitreous detachment; RD retinal detachment; DM diabetes mellitus; DR diabetic retinopathy; NPDR non-proliferative diabetic retinopathy; PDR proliferative diabetic retinopathy; CSME clinically significant macular edema; DME diabetic macular edema; dbh dot blot hemorrhages; CWS cotton wool spot; POAG primary open angle glaucoma; C/D cup-to-disc ratio; HVF humphrey visual field; GVF goldmann visual field; OCT optical coherence tomography; IOP intraocular pressure; BRVO Branch retinal vein occlusion; CRVO central retinal vein occlusion; CRAO central retinal artery occlusion; BRAO branch retinal artery occlusion; RT retinal tear; SB scleral buckle; PPV pars plana vitrectomy; VH Vitreous hemorrhage; PRP panretinal laser photocoagulation; IVK intravitreal kenalog; VMT vitreomacular traction; MH Macular hole;  NVD neovascularization of the disc; NVE neovascularization elsewhere; AREDS age related eye disease study; ARMD age related macular degeneration; POAG primary open angle glaucoma; EBMD epithelial/anterior basement membrane dystrophy; ACIOL anterior chamber intraocular lens; IOL intraocular lens; PCIOL posterior chamber intraocular lens; Phaco/IOL phacoemulsification with intraocular lens placement; PRK photorefractive keratectomy; LASIK laser assisted in situ keratomileusis; HTN hypertension; DM diabetes mellitus; COPD chronic obstructive pulmonary disease

## 2024-07-30 ENCOUNTER — Other Ambulatory Visit: Payer: Self-pay | Admitting: Family Medicine

## 2024-07-30 DIAGNOSIS — E782 Mixed hyperlipidemia: Secondary | ICD-10-CM

## 2024-07-31 ENCOUNTER — Encounter (INDEPENDENT_AMBULATORY_CARE_PROVIDER_SITE_OTHER): Payer: Self-pay | Admitting: Ophthalmology

## 2024-07-31 ENCOUNTER — Ambulatory Visit (INDEPENDENT_AMBULATORY_CARE_PROVIDER_SITE_OTHER): Admitting: Ophthalmology

## 2024-07-31 DIAGNOSIS — H353231 Exudative age-related macular degeneration, bilateral, with active choroidal neovascularization: Secondary | ICD-10-CM | POA: Diagnosis not present

## 2024-07-31 DIAGNOSIS — H40053 Ocular hypertension, bilateral: Secondary | ICD-10-CM

## 2024-07-31 DIAGNOSIS — H35033 Hypertensive retinopathy, bilateral: Secondary | ICD-10-CM

## 2024-07-31 DIAGNOSIS — H04123 Dry eye syndrome of bilateral lacrimal glands: Secondary | ICD-10-CM | POA: Diagnosis not present

## 2024-07-31 DIAGNOSIS — I10 Essential (primary) hypertension: Secondary | ICD-10-CM

## 2024-07-31 DIAGNOSIS — H43813 Vitreous degeneration, bilateral: Secondary | ICD-10-CM | POA: Diagnosis not present

## 2024-07-31 DIAGNOSIS — Z961 Presence of intraocular lens: Secondary | ICD-10-CM | POA: Diagnosis not present

## 2024-07-31 MED ORDER — FARICIMAB-SVOA 6 MG/0.05ML IZ SOSY
6.0000 mg | PREFILLED_SYRINGE | INTRAVITREAL | Status: AC | PRN
  Administered 2024-07-31: 6 mg via INTRAVITREAL

## 2024-09-10 NOTE — Progress Notes (Signed)
 [facility] - Clinic Note  09/18/2024    CHIEF COMPLAINT Patient presents for Retina Follow Up  HISTORY OF PRESENT ILLNESS: Gabrielle [name] is a 78 y.o. female who presents to the clinic today for:   HPI     Retina Follow Up   Patient presents with  Wet AMD.  In both eyes.  This started 7 weeks ago.        Comments   Patient here for 7 weeks retina follow up for  exu ARMD OU. Patient states vision about the same. Can see. No eye pain.       Last edited by Orval Asberry RAMAN, COA on 09/18/2024  8:11 AM.      Pt states no vision changes, stable.    Referring physician: Jolinda Norene HERO, DO [address] [place],  [place] [address]  HISTORICAL INFORMATION:   Selected notes from the MEDICAL RECORD NUMBER Referred by Dr. MICAEL Andreas for concern of ARMD OU   CURRENT MEDICATIONS: Current Outpatient Medications (Ophthalmic Drugs)  Medication Sig   dorzolamide -timolol  (COSOPT ) 2-0.5 % ophthalmic solution INSTILL ONE DROP IN THE RIGHT EYE TWICE DAILY   No current facility-administered medications for this visit. (Ophthalmic Drugs)   Current Outpatient Medications (Other)  Medication Sig   alendronate  (FOSAMAX ) 70 MG tablet TAKE ONE TABLET EVERY 7 DAYS ON AN EMPTY STOMACH WITH A FULL GLASS OF WATER    amLODipine  (NORVASC ) 10 MG tablet TAKE ONE TABLET DAILY FOR BLOOD PRESSURE   atorvastatin  (LIPITOR) 20 MG tablet Take 1 tablet (20 mg total) by mouth daily.   cholecalciferol  (VITAMIN D3) 25 MCG (1000 UT) tablet Take 1,000 Units by mouth daily.   CRANBERRY FRUIT PO Take 1 tablet by mouth daily.   diclofenac  Sodium (VOLTAREN ) 1 % GEL APPLY 4 GRAMS TO AFFECTED AREA(S) 4 TIMES A DAY   ELIQUIS  5 MG TABS tablet TAKE ONE TABLET TWICE DAILY   Ferrous Sulfate  (IRON ) 325 (65 Fe) MG TABS Take 1 tablet (325 mg total) by mouth daily.   fluticasone  (FLONASE ) 50 MCG/ACT nasal spray PLACE 2 SPRAYS INTO EACH NOSTRIL ONCE A DAY   guaiFENesin -codeine  100-10 MG/5ML syrup Take 5  mLs by mouth 3 (three) times daily as needed for cough.   hydrALAZINE  (APRESOLINE ) 25 MG tablet TAKE ONE TABLET TWICE DAILY   Multiple Vitamins-Minerals (CENTRUM ADULT PO) Take 1 tablet by mouth daily.   Potassium Chloride  ER 20 MEQ TBCR Take 1 tablet (20 mEq total) by mouth daily. Take while taking hydrochlorothiazide /HCTZ   No current facility-administered medications for this visit. (Other)   REVIEW OF SYSTEMS: ROS   Positive for: Gastrointestinal, Genitourinary, Musculoskeletal, Cardiovascular, Eyes Negative for: Constitutional, Neurological, Skin, HENT, Endocrine, Respiratory, Psychiatric, Allergic/Imm, Heme/Lymph Last edited by Orval Asberry RAMAN, COA on 09/18/2024  8:11 AM.       ALLERGIES Allergies  Allergen Reactions   Lisinopril  Cough   PAST MEDICAL HISTORY Past Medical History:  Diagnosis Date   Arthritis    Cancer [facility])    Cataract    OU   Colon cancer (HCC)    Family history of pancreatic cancer 08/21/2020   Family history of uterine cancer 08/21/2020   H/O cesarean section    Hx of tonsillectomy    Hyperlipidemia    Hypertension    Kidney disease    Macular degeneration    Exu ARMD OU   Paroxysmal atrial fibrillation (HCC) 07/05/2020   Stroke (cerebrum) (HCC) 07/05/2020   Urinary retention 04/15/2020  Past Surgical History:  Procedure Laterality Date   BIOPSY  04/06/2020   Procedure: BIOPSY;  Surgeon: Golda Claudis PENNER, MD;  Location: [facility];  Service: Endoscopy;;   CARPAL TUNNEL RELEASE Right    CATARACT EXTRACTION W/PHACO Left 10/11/2019   Procedure: CATARACT EXTRACTION PHACO AND INTRAOCULAR LENS PLACEMENT (IOC);  Surgeon: Harrie Agent, MD;  Location: [facility];  Service: Ophthalmology;  Laterality: Left;  CDE: 8.56   CATARACT EXTRACTION W/PHACO Right 10/25/2019   Procedure: CATARACT EXTRACTION PHACO AND INTRAOCULAR LENS PLACEMENT (IOC);  Surgeon: Harrie Agent, MD;  Location: [facility];  Service: Ophthalmology;  Laterality: Right;  CDE: 5.67    CESAREAN SECTION     COLONOSCOPY N/A 04/06/2020   Procedure: COLONOSCOPY;  Surgeon: Golda Claudis PENNER, MD;  Location: [facility];  Service: Endoscopy;  Laterality: N/A;   CYSTOSCOPY WITH BIOPSY N/A 04/08/2020   Procedure: CYSTOSCOPY WITH BIOPSY;  Surgeon: Sherrilee Belvie CROME, MD;  Location: [facility];  Service: Urology;  Laterality: N/A;   ESOPHAGOGASTRODUODENOSCOPY N/A 04/05/2020   Procedure: ESOPHAGOGASTRODUODENOSCOPY (EGD);  Surgeon: Golda Claudis PENNER, MD;  [facility]: [facility];  Service: Endoscopy;  Laterality: N/A;   PARTIAL COLECTOMY N/A 04/08/2020   Procedure: PARTIAL COLECTOMY;  Surgeon: Kallie Manuelita BROCKS, MD;  Location: [facility];  Service: General;  Laterality: N/A;   PORTACATH PLACEMENT Left 05/18/2020   Procedure: INSERTION PORT-A-CATH (ATTACHED CATHETER IN LEFT SUBCLAVIAN);  Surgeon: Kallie Manuelita BROCKS, MD;  Location: [facility];  Service: General;  Laterality: Left;   TONSILLECTOMY     FAMILY HISTORY Family History  Problem Relation Age of Onset   Macular degeneration Mother    Stroke Mother    Hypertension Mother    Dementia Mother    Lung cancer Father        dx late 22s; smoking hx   Diabetes Sister    Hypertension Sister    Leukemia Paternal Uncle        d. 58s   Cancer Maternal Aunt        ovarian or endometrial dx 39s   Pancreatic cancer Paternal Uncle        d. late 64s   SOCIAL HISTORY Social History   Tobacco Use   Smoking status: Never   Smokeless tobacco: Never  Vaping Use   Vaping status: Never Used  Substance Use Topics   Alcohol use: No   Drug use: No       OPHTHALMIC EXAM: Base Eye Exam     Visual Acuity (Snellen - Linear)       Right Left   Dist cc CF at 3' 20/60 -1    Correction: Glasses         Tonometry (Tonopen, 8:00 AM)       Right Left   Pressure 16 15         Pupils       Dark Light Shape React APD   Right 3 2 Round Brisk None   Left 3 2 Round Brisk None         Visual Fields (Counting fingers)       Left Right     Full Full         Extraocular Movement       Right Left    Full, Ortho Full, Ortho         Neuro/Psych     Oriented x3: Yes   Mood/Affect: Normal         Dilation     Both  eyes: 1.0% Mydriacyl, 2.5% Phenylephrine  @ 8:00 AM           Slit Lamp and Fundus Exam     Slit Lamp Exam       Right Left   Lids/Lashes Dermatochalasis - upper lid Dermatochalasis - upper lid   Conjunctiva/Sclera 1+ Injection Trace Injection, +mucus   Cornea Arcus, 3+ fine Punctpate epithelial erosions Arcus, trace Punctate epithelial erosions, mild tear film debris, trace endo pigment, high tear lake   Anterior Chamber Deep and quiet Deep and quiet   Iris Round and well dilated Round and well dilated   Lens PCIOL in good position, 1+ Posterior capsular opacification PC IOL in good position, trace PCO nasally   Anterior Vitreous Vitreous syneresis, Posterior vitreous detachment, vitreous condensations Vitreous syneresis, Posterior vitreous detachment, silicone oil micro drops         Fundus Exam       Right Left   Disc pink and sharp, compact mild pallor, sharp rim, Compact, vascular loops superiorly   C/D Ratio 0.2 0.2   Macula Blunted foveal reflex, large central GA, Drusen, Pigment clumping and atrophy, central thickening/pigmented disciform scar, +PED, persistent trace cystic changes overlying central scar, no heme Blunted foveal reflex, central thickening, RPE clumping and atrophy, Drusen, trace cystic changes overlying PED -- persistent, no frank heme   Vessels Vascular attenuation, Tortuous Vascular attenuation, Tortuous   Periphery Attached, mild Reticular degeneration Attached, mild Reticular degeneration           Refraction     Wearing Rx       Sphere Cylinder Add   Right [place] Sphere +3.50   Left -0.50 Sphere +3.50    Type: PAL           IMAGING AND PROCEDURES  Imaging and Procedures for 03/21/18  OCT, Retina - OU - Both Eyes       Right Eye Quality was  good. Central Foveal Thickness: 450. Progression has been stable. Findings include no SRF, abnormal foveal contour, retinal drusen , outer retinal tubulation, subretinal hyper-reflective material, disciform scar, epiretinal membrane, intraretinal fluid, pigment epithelial detachment, outer retinal atrophy (Persistent cystic changes overlying stable central SRHM / disciform scar).   Left Eye Quality was good. Central Foveal Thickness: 325. Progression has been stable. Findings include no SRF, abnormal foveal contour, retinal drusen , subretinal hyper-reflective material, intraretinal fluid, pigment epithelial detachment, outer retinal atrophy (Trace persistent cystic changes nasal fovea and macula overlying stable PED/SRHM).   Notes *Images captured and stored on drive  Diagnosis / Impression:  Exudative ARMD OU OD: persistent cystic changes overlying stable central SRHM / disciform scar  OS: trace persistent cystic changes nasal fovea and macula overlying PED/SRHM   Clinical management:  See below  Abbreviations: NFP - Normal foveal profile. CME - cystoid macular edema. PED - pigment epithelial detachment. IRF - intraretinal fluid. SRF - subretinal fluid. EZ - ellipsoid zone. ERM - epiretinal membrane. ORA - outer retinal atrophy. ORT - outer retinal tubulation. SRHM - subretinal hyper-reflective material      Intravitreal Injection, Pharmacologic Agent - OS - Left Eye       Time Out 09/18/2024. 7:52 AM. Confirmed correct patient, procedure, site, and patient consented.   Anesthesia Topical anesthesia was used. Anesthetic medications included Lidocaine  2%, Proparacaine 0.5%.   Procedure Preparation included 5% betadine  to ocular surface, eyelid speculum. A supplied (32g) needle was used.   Injection: 6 mg faricimab -svoa 6 MG/0.05ML   Route: Intravitreal, Site: Left Eye  NDC: 49757-903-93, Waste: 0 mL   Post-op Post injection exam found visual acuity of at least counting  fingers. The patient tolerated the procedure well. There were no complications. The patient received written and verbal post procedure care education. Post injection medications were not given.             ASSESSMENT/PLAN:    ICD-10-CM   1. Exudative age-related macular degeneration of both eyes with active choroidal neovascularization (HCC)  H35.3231 OCT, Retina - OU - Both Eyes    Intravitreal Injection, Pharmacologic Agent - OS - Left Eye    2. Posterior vitreous detachment of both eyes  H43.813     3. Essential hypertension  I10     4. Hypertensive retinopathy of both eyes  H35.033     5. Pseudophakia of both eyes  Z96.1     6. Ocular hypertension, bilateral  H40.053     7. Dry eyes  H04.123       1. Exudative age-related macular degeneration, both eyes - severe exudative disease with very active CNVM OU at presentation in January 2019  - S/P IVA OD #1 (01.04.19), #2 (02.15.19)  - S/P IVA OS #1 (01.18.19), #2 (02.15.19)  - switched to Eylea  3.18.19 due to severity of disease  =============================================== - S/P IVE OD #1 (03.18.19), #2 (04.17.19), #3 (05.15.19), #4 (10.02.19),  #5 (01.15.20), #6 (06.04.20), #7 (10.29.20), #8 (01.21.21), #9 (05.27.21), #10 (07.22.21) -- injections held due to stable disciform scar, #11 (08.12.24) for new IRH, #12 (09.25.24), #13 (11.06.24) - S/P IVE OS #1 (03.18.19), #2 (04.17.19), #3 (05.15.19), #4 (06.12.19), #5 (07.10.19), #6 (08.07.19), #7 (09.04.19), #8 (10.02.19), #9 (11.06.19), #10 (12.11.19), #11 (01.15.20), #12 (02.20.20), #13 (03.26.20), #14 (04.30.20), #15 (06.04.20), #16 (07.16.20), # 17 (08.20.20), #18 (09.24.20), #19 (10.29.20), #20 (12.03.20), #21 (01.21.21), #22 (03.18.21), #23 (05.27.21), #24 (07.22.21), #25 (11.11.21), #26 (12.09.21), #27 (01.06.22), #28 (2.7.22), #29 (03.24.22), #30 (4.28.22), #31 (05.31.22), #32 (06.30.22), #33 (08.04.22), #34 (09.15.22), #35 (10.27.22), #36 (12.08.22), #37 (01.12.23), #38  (02.16.23), #39 (03.30.23), #40 (05.11.23), #41 (06.22.23), #42 (07.27.23), #43 (09.01.23), #44 (10.06.23), #45 (11.10.23), #46 (12.15.23) -- IVE resistance ================================================ - s/p IVV OS #1 (01.12.24 -- sample), #2 (02.09.24), #3 (03.15.24), #4 (04.19.24), #5 (05.24.24), #6 (07.01.24), #7 (08.12.24), #8 (09.25.24), #9 (11.06.24), #10 (12.18.24), #11 (01.29.25), #12 (03.12.25), #13 (04.23.25) #14 (06.04.25), #15 (07.16.25), #16 (08.27.25)  - IVE OS held from 7.22.21 to 11.11.21 due to TIA/stroke on 8.1.21 - OCT OD: persistent cystic changes overlying stable central SRHM / disciform scar; OS: trace persistent cystic changes nasal fovea and macula overlying stable PED/SRHM at 7 weeks - exam OD with focal IRH superior macula within atrophy -- improved  - BCVA OD stable at CF 3'; OS stable at 20/60  - recommend IVV OS #17 today, 10.15.25 w/ f/u extended to 7 wks - IVV authorization obtained and Good Days reinstated - will hold IVE OD today -- pt in agreement - RBA of procedure discussed, questions answered - IVV informed consent obtained and signed on 01.29.25 - see procedure note   - [employer] funding reinstated for Ms. Cadman as of 03.12.25 visit - f/u in 7 weeks, sooner prn for DFE/OCT/possible injection(s)  2. PVD / vitreous syneresis  - Discussed findings and prognosis   - No RT or RD on 360 exam  - Reviewed s/s of RT/RD  - strict return precautions for any such RT/RD symptoms  3,4. Hypertensive retinopathy OU - exam shows persistent focal IRH OD within GA - discussed importance of  tight BP control - monitor  5. Pseudophakia OU  - s/p CE/IOL (Dr. Harrie, 11.2020)  - beautiful surgeries w/ IOLs in excellent position, doing well  - monitor  6. Ocular Hypertension OU  - IOP 16,15  - continue cosopt  bid OU  7. Dry eyes OU - recommend artificial tears and lubricating ointment as needed  Ophthalmic Meds Ordered this visit:  No orders of the defined  types were placed in this encounter.    No follow-ups on file.  There are no Patient Instructions on file for this visit.  This document serves as a record of services personally performed by Redell JUDITHANN Hans, MD, PhD. It was created on their behalf by Almetta Pesa, an ophthalmic technician. The creation of this record is the provider's dictation and/or activities during the visit.    Electronically signed by: Almetta Pesa, OA, 09/18/24  8:47 AM  Redell JUDITHANN Hans, M.D., Ph.D. Diseases & Surgery of the Retina and Vitreous [facility]    Abbreviations: M myopia (nearsighted); A astigmatism; H hyperopia (farsighted); P presbyopia; Mrx spectacle prescription;  CTL contact lenses; OD right eye; OS left eye; OU both eyes  XT exotropia; ET esotropia; PEK punctate epithelial keratitis; PEE punctate epithelial erosions; DES dry eye syndrome; MGD meibomian gland dysfunction; ATs artificial tears; PFAT's preservative free artificial tears; NSC nuclear sclerotic cataract; PSC posterior subcapsular cataract; ERM epi-retinal membrane; PVD posterior vitreous detachment; RD retinal detachment; DM diabetes mellitus; DR diabetic retinopathy; NPDR non-proliferative diabetic retinopathy; PDR proliferative diabetic retinopathy; CSME clinically significant macular edema; DME diabetic macular edema; dbh dot blot hemorrhages; CWS cotton wool spot; POAG primary open angle glaucoma; C/D cup-to-disc ratio; HVF humphrey visual field; GVF goldmann visual field; OCT optical coherence tomography; IOP intraocular pressure; BRVO Branch retinal vein occlusion; CRVO central retinal vein occlusion; CRAO central retinal artery occlusion; BRAO branch retinal artery occlusion; RT retinal tear; SB scleral buckle; PPV pars plana vitrectomy; VH Vitreous hemorrhage; PRP panretinal laser photocoagulation; IVK intravitreal kenalog; VMT vitreomacular traction; MH Macular hole;  NVD neovascularization of the disc;  NVE neovascularization elsewhere; AREDS age related eye disease study; ARMD age related macular degeneration; POAG primary open angle glaucoma; EBMD epithelial/anterior basement membrane dystrophy; ACIOL anterior chamber intraocular lens; IOL intraocular lens; PCIOL posterior chamber intraocular lens; Phaco/IOL phacoemulsification with intraocular lens placement; PRK photorefractive keratectomy; LASIK laser assisted in situ keratomileusis; HTN hypertension; DM diabetes mellitus; COPD chronic obstructive pulmonary disease

## 2024-09-18 ENCOUNTER — Encounter (INDEPENDENT_AMBULATORY_CARE_PROVIDER_SITE_OTHER): Payer: Self-pay | Admitting: Ophthalmology

## 2024-09-18 ENCOUNTER — Ambulatory Visit (INDEPENDENT_AMBULATORY_CARE_PROVIDER_SITE_OTHER): Admitting: Ophthalmology

## 2024-09-18 DIAGNOSIS — H04123 Dry eye syndrome of bilateral lacrimal glands: Secondary | ICD-10-CM

## 2024-09-18 DIAGNOSIS — Z961 Presence of intraocular lens: Secondary | ICD-10-CM | POA: Diagnosis not present

## 2024-09-18 DIAGNOSIS — I10 Essential (primary) hypertension: Secondary | ICD-10-CM | POA: Diagnosis not present

## 2024-09-18 DIAGNOSIS — H40053 Ocular hypertension, bilateral: Secondary | ICD-10-CM

## 2024-09-18 DIAGNOSIS — H353231 Exudative age-related macular degeneration, bilateral, with active choroidal neovascularization: Secondary | ICD-10-CM | POA: Diagnosis not present

## 2024-09-18 DIAGNOSIS — H43813 Vitreous degeneration, bilateral: Secondary | ICD-10-CM

## 2024-09-18 DIAGNOSIS — H35033 Hypertensive retinopathy, bilateral: Secondary | ICD-10-CM | POA: Diagnosis not present

## 2024-09-19 ENCOUNTER — Encounter (INDEPENDENT_AMBULATORY_CARE_PROVIDER_SITE_OTHER): Payer: Self-pay | Admitting: Ophthalmology

## 2024-09-19 MED ORDER — FARICIMAB-SVOA 6 MG/0.05ML IZ SOSY
6.0000 mg | PREFILLED_SYRINGE | INTRAVITREAL | Status: AC | PRN
  Administered 2024-09-19: 6 mg via INTRAVITREAL

## 2024-09-23 DIAGNOSIS — N189 Chronic kidney disease, unspecified: Secondary | ICD-10-CM | POA: Diagnosis not present

## 2024-09-23 DIAGNOSIS — R809 Proteinuria, unspecified: Secondary | ICD-10-CM | POA: Diagnosis not present

## 2024-09-23 DIAGNOSIS — D631 Anemia in chronic kidney disease: Secondary | ICD-10-CM | POA: Diagnosis not present

## 2024-10-02 DIAGNOSIS — I129 Hypertensive chronic kidney disease with stage 1 through stage 4 chronic kidney disease, or unspecified chronic kidney disease: Secondary | ICD-10-CM | POA: Diagnosis not present

## 2024-10-02 DIAGNOSIS — N1831 Chronic kidney disease, stage 3a: Secondary | ICD-10-CM | POA: Diagnosis not present

## 2024-10-02 DIAGNOSIS — I5032 Chronic diastolic (congestive) heart failure: Secondary | ICD-10-CM | POA: Diagnosis not present

## 2024-10-02 DIAGNOSIS — C189 Malignant neoplasm of colon, unspecified: Secondary | ICD-10-CM | POA: Diagnosis not present

## 2024-10-15 ENCOUNTER — Other Ambulatory Visit: Payer: Self-pay | Admitting: Family Medicine

## 2024-10-18 DIAGNOSIS — C189 Malignant neoplasm of colon, unspecified: Secondary | ICD-10-CM | POA: Diagnosis not present

## 2024-10-18 DIAGNOSIS — Z6831 Body mass index (BMI) 31.0-31.9, adult: Secondary | ICD-10-CM | POA: Diagnosis not present

## 2024-10-18 DIAGNOSIS — I2782 Chronic pulmonary embolism: Secondary | ICD-10-CM | POA: Diagnosis not present

## 2024-10-18 DIAGNOSIS — D6869 Other thrombophilia: Secondary | ICD-10-CM | POA: Diagnosis not present

## 2024-10-18 DIAGNOSIS — N2581 Secondary hyperparathyroidism of renal origin: Secondary | ICD-10-CM | POA: Diagnosis not present

## 2024-10-18 DIAGNOSIS — I10 Essential (primary) hypertension: Secondary | ICD-10-CM | POA: Diagnosis not present

## 2024-10-18 DIAGNOSIS — I82729 Chronic embolism and thrombosis of deep veins of unspecified upper extremity: Secondary | ICD-10-CM | POA: Diagnosis not present

## 2024-10-18 DIAGNOSIS — Z7983 Long term (current) use of bisphosphonates: Secondary | ICD-10-CM | POA: Diagnosis not present

## 2024-10-18 DIAGNOSIS — E785 Hyperlipidemia, unspecified: Secondary | ICD-10-CM | POA: Diagnosis not present

## 2024-10-18 DIAGNOSIS — H353290 Exudative age-related macular degeneration, unspecified eye, stage unspecified: Secondary | ICD-10-CM | POA: Diagnosis not present

## 2024-10-18 DIAGNOSIS — I69328 Other speech and language deficits following cerebral infarction: Secondary | ICD-10-CM | POA: Diagnosis not present

## 2024-10-18 DIAGNOSIS — Z8589 Personal history of malignant neoplasm of other organs and systems: Secondary | ICD-10-CM | POA: Diagnosis not present

## 2024-10-18 DIAGNOSIS — E669 Obesity, unspecified: Secondary | ICD-10-CM | POA: Diagnosis not present

## 2024-10-18 DIAGNOSIS — Z9181 History of falling: Secondary | ICD-10-CM | POA: Diagnosis not present

## 2024-10-18 DIAGNOSIS — R269 Unspecified abnormalities of gait and mobility: Secondary | ICD-10-CM | POA: Diagnosis not present

## 2024-10-18 DIAGNOSIS — I82509 Chronic embolism and thrombosis of unspecified deep veins of unspecified lower extremity: Secondary | ICD-10-CM | POA: Diagnosis not present

## 2024-10-21 ENCOUNTER — Other Ambulatory Visit: Payer: Self-pay | Admitting: *Deleted

## 2024-10-21 DIAGNOSIS — E782 Mixed hyperlipidemia: Secondary | ICD-10-CM

## 2024-10-24 NOTE — Progress Notes (Shared)
 [facility] - Clinic Note  11/06/2024    CHIEF COMPLAINT Patient presents for No chief complaint on file.  HISTORY OF PRESENT ILLNESS: Gabrielle [name] is a 78 y.o. female who presents to the clinic today for:      Pt states no vision changes, stable.    Referring physician: Jolinda Norene HERO, DO [address] [place],  [place] [address]  HISTORICAL INFORMATION:   Selected notes from the MEDICAL RECORD NUMBER Referred by Dr. MICAEL Andreas for concern of ARMD OU   CURRENT MEDICATIONS: Current Outpatient Medications (Ophthalmic Drugs)  Medication Sig   dorzolamide -timolol  (COSOPT ) 2-0.5 % ophthalmic solution INSTILL ONE DROP IN THE RIGHT EYE TWICE DAILY   No current facility-administered medications for this visit. (Ophthalmic Drugs)   Current Outpatient Medications (Other)  Medication Sig   alendronate  (FOSAMAX ) 70 MG tablet TAKE ONE TABLET EVERY 7 DAYS ON AN EMPTY STOMACH WITH A FULL GLASS OF WATER    amLODipine  (NORVASC ) 10 MG tablet TAKE ONE TABLET DAILY FOR BLOOD PRESSURE   atorvastatin  (LIPITOR) 20 MG tablet TAKE ONE TABLET DAILY FOR CHOLESTEROL   cholecalciferol  (VITAMIN D3) 25 MCG (1000 UT) tablet Take 1,000 Units by mouth daily.   CRANBERRY FRUIT PO Take 1 tablet by mouth daily.   diclofenac  Sodium (VOLTAREN ) 1 % GEL APPLY 4 GRAMS TO AFFECTED AREA(S) 4 TIMES A DAY   ELIQUIS  5 MG TABS tablet TAKE ONE TABLET TWICE DAILY   Ferrous Sulfate  (IRON ) 325 (65 Fe) MG TABS Take 1 tablet (325 mg total) by mouth daily.   fluticasone  (FLONASE ) 50 MCG/ACT nasal spray PLACE 2 SPRAYS INTO EACH NOSTRIL ONCE A DAY   guaiFENesin -codeine  100-10 MG/5ML syrup Take 5 mLs by mouth 3 (three) times daily as needed for cough.   hydrALAZINE  (APRESOLINE ) 25 MG tablet TAKE ONE TABLET TWICE DAILY   Multiple Vitamins-Minerals (CENTRUM ADULT PO) Take 1 tablet by mouth daily.   Potassium Chloride  ER 20 MEQ TBCR Take 1 tablet (20 mEq total) by mouth daily. Take while taking  hydrochlorothiazide /HCTZ   No current facility-administered medications for this visit. (Other)   REVIEW OF SYSTEMS:     ALLERGIES Allergies  Allergen Reactions   Lisinopril  Cough   PAST MEDICAL HISTORY Past Medical History:  Diagnosis Date   Arthritis    Cancer [facility])    Cataract    OU   Colon cancer (HCC)    Family history of pancreatic cancer 08/21/2020   Family history of uterine cancer 08/21/2020   H/O cesarean section    Hx of tonsillectomy    Hyperlipidemia    Hypertension    Kidney disease    Macular degeneration    Exu ARMD OU   Paroxysmal atrial fibrillation (HCC) 07/05/2020   Stroke (cerebrum) (HCC) 07/05/2020   Urinary retention 04/15/2020   Past Surgical History:  Procedure Laterality Date   BIOPSY  04/06/2020   Procedure: BIOPSY;  Surgeon: Golda Claudis PENNER, MD;  Location: [facility];  Service: Endoscopy;;   CARPAL TUNNEL RELEASE Right    CATARACT EXTRACTION W/PHACO Left 10/11/2019   Procedure: CATARACT EXTRACTION PHACO AND INTRAOCULAR LENS PLACEMENT (IOC);  Surgeon: Harrie Agent, MD;  Location: [facility];  Service: Ophthalmology;  Laterality: Left;  CDE: 8.56   CATARACT EXTRACTION W/PHACO Right 10/25/2019   Procedure: CATARACT EXTRACTION PHACO AND INTRAOCULAR LENS PLACEMENT (IOC);  Surgeon: Harrie Agent, MD;  Location: [facility];  Service: Ophthalmology;  Laterality: Right;  CDE: 5.67   CESAREAN SECTION  COLONOSCOPY N/A 04/06/2020   Procedure: COLONOSCOPY;  Surgeon: Golda Claudis PENNER, MD;  Location: [facility];  Service: Endoscopy;  Laterality: N/A;   CYSTOSCOPY WITH BIOPSY N/A 04/08/2020   Procedure: CYSTOSCOPY WITH BIOPSY;  Surgeon: Sherrilee Belvie CROME, MD;  Location: [facility];  Service: Urology;  Laterality: N/A;   ESOPHAGOGASTRODUODENOSCOPY N/A 04/05/2020   Procedure: ESOPHAGOGASTRODUODENOSCOPY (EGD);  Surgeon: Golda Claudis PENNER, MD;  [facility]: [facility];  Service: Endoscopy;  Laterality: N/A;   PARTIAL COLECTOMY N/A 04/08/2020   Procedure: PARTIAL  COLECTOMY;  Surgeon: Kallie Manuelita BROCKS, MD;  Location: [facility];  Service: General;  Laterality: N/A;   PORTACATH PLACEMENT Left 05/18/2020   Procedure: INSERTION PORT-A-CATH (ATTACHED CATHETER IN LEFT SUBCLAVIAN);  Surgeon: Kallie Manuelita BROCKS, MD;  Location: [facility];  Service: General;  Laterality: Left;   TONSILLECTOMY     FAMILY HISTORY Family History  Problem Relation Age of Onset   Macular degeneration Mother    Stroke Mother    Hypertension Mother    Dementia Mother    Lung cancer Father        dx late 21s; smoking hx   Diabetes Sister    Hypertension Sister    Leukemia Paternal Uncle        d. 61s   Cancer Maternal Aunt        ovarian or endometrial dx 15s   Pancreatic cancer Paternal Uncle        d. late 23s   SOCIAL HISTORY Social History   Tobacco Use   Smoking status: Never   Smokeless tobacco: Never  Vaping Use   Vaping status: Never Used  Substance Use Topics   Alcohol use: No   Drug use: No       OPHTHALMIC EXAM: Not recorded    IMAGING AND PROCEDURES  Imaging and Procedures for 03/21/18           ASSESSMENT/PLAN:  No diagnosis found.   1. Exudative age-related macular degeneration, both eyes - severe exudative disease with very active CNVM OU at presentation in January 2019  - S/P IVA OD #1 (01.04.19), #2 (02.15.19)  - S/P IVA OS #1 (01.18.19), #2 (02.15.19)  - switched to Eylea  3.18.19 due to severity of disease  =============================================== - S/P IVE OD #1 (03.18.19), #2 (04.17.19), #3 (05.15.19), #4 (10.02.19),  #5 (01.15.20), #6 (06.04.20), #7 (10.29.20), #8 (01.21.21), #9 (05.27.21), #10 (07.22.21) -- injections held due to stable disciform scar, #11 (08.12.24) for new IRH, #12 (09.25.24), #13 (11.06.24) - S/P IVE OS #1 (03.18.19), #2 (04.17.19), #3 (05.15.19), #4 (06.12.19), #5 (07.10.19), #6 (08.07.19), #7 (09.04.19), #8 (10.02.19), #9 (11.06.19), #10 (12.11.19), #11 (01.15.20), #12 (02.20.20), #13 (03.26.20), #14  (04.30.20), #15 (06.04.20), #16 (07.16.20), # 17 (08.20.20), #18 (09.24.20), #19 (10.29.20), #20 (12.03.20), #21 (01.21.21), #22 (03.18.21), #23 (05.27.21), #24 (07.22.21), #25 (11.11.21), #26 (12.09.21), #27 (01.06.22), #28 (2.7.22), #29 (03.24.22), #30 (4.28.22), #31 (05.31.22), #32 (06.30.22), #33 (08.04.22), #34 (09.15.22), #35 (10.27.22), #36 (12.08.22), #37 (01.12.23), #38 (02.16.23), #39 (03.30.23), #40 (05.11.23), #41 (06.22.23), #42 (07.27.23), #43 (09.01.23), #44 (10.06.23), #45 (11.10.23), #46 (12.15.23) -- IVE resistance ================================================ - s/p IVV OS #1 (01.12.24 -- sample), #2 (02.09.24), #3 (03.15.24), #4 (04.19.24), #5 (05.24.24), #6 (07.01.24), #7 (08.12.24), #8 (09.25.24), #9 (11.06.24), #10 (12.18.24), #11 (01.29.25), #12 (03.12.25), #13 (04.23.25) #14 (06.04.25), #15 (07.16.25), #16 (08.27.25), #17 (10.15.25)  - IVE OS held from 7.22.21 to 11.11.21 due to TIA/stroke on 8.1.21 - OCT OD: persistent cystic changes overlying stable central SRHM / disciform scar; OS: trace persistent  cystic changes nasal fovea and macula overlying stable PED/SRHM at 7 weeks - exam OD with focal IRH superior macula within atrophy -- improved  - BCVA OD stable at CF 3'; OS stable at 20/60  - recommend IVV OS #18 today, 12.03.25 w/ f/u in 7 wks - IVV authorization obtained and Good Days reinstated - will hold IVE OD today -- pt in agreement - RBA of procedure discussed, questions answered - IVV informed consent obtained and signed on 01.29.25 - see procedure note   - [employer] Days funding reinstated for Ms. Stolz as of 03.12.25 visit - f/u in 7 weeks, sooner prn for DFE/OCT/possible injection(s)  2. PVD / vitreous syneresis  - Discussed findings and prognosis   - No RT or RD on 360 exam  - Reviewed s/s of RT/RD  - strict return precautions for any such RT/RD symptoms  3,4. Hypertensive retinopathy OU - exam shows persistent focal IRH OD within GA - discussed importance  of tight BP control - monitor  5. Pseudophakia OU  - s/p CE/IOL (Dr. Harrie, 11.2020)  - beautiful surgeries w/ IOLs in excellent position, doing well  - monitor  6. Ocular Hypertension OU  - IOP 16,15  - continue cosopt  bid OU  7. Dry eyes OU - recommend artificial tears and lubricating ointment as needed  Ophthalmic Meds Ordered this visit:  No orders of the defined types were placed in this encounter.    No follow-ups on file.  There are no Patient Instructions on file for this visit.  This document serves as a record of services personally performed by Redell JUDITHANN Hans, MD, PhD. It was created on their behalf by Almetta Pesa, an ophthalmic technician. The creation of this record is the provider's dictation and/or activities during the visit.    Electronically signed by: Almetta Pesa, OA, 10/24/24  11:53 AM  Redell JUDITHANN Hans, M.D., Ph.D. Diseases & Surgery of the Retina and Vitreous [facility]   Abbreviations: M myopia (nearsighted); A astigmatism; H hyperopia (farsighted); P presbyopia; Mrx spectacle prescription;  CTL contact lenses; OD right eye; OS left eye; OU both eyes  XT exotropia; ET esotropia; PEK punctate epithelial keratitis; PEE punctate epithelial erosions; DES dry eye syndrome; MGD meibomian gland dysfunction; ATs artificial tears; PFAT's preservative free artificial tears; NSC nuclear sclerotic cataract; PSC posterior subcapsular cataract; ERM epi-retinal membrane; PVD posterior vitreous detachment; RD retinal detachment; DM diabetes mellitus; DR diabetic retinopathy; NPDR non-proliferative diabetic retinopathy; PDR proliferative diabetic retinopathy; CSME clinically significant macular edema; DME diabetic macular edema; dbh dot blot hemorrhages; CWS cotton wool spot; POAG primary open angle glaucoma; C/D cup-to-disc ratio; HVF humphrey visual field; GVF goldmann visual field; OCT optical coherence tomography; IOP intraocular  pressure; BRVO Branch retinal vein occlusion; CRVO central retinal vein occlusion; CRAO central retinal artery occlusion; BRAO branch retinal artery occlusion; RT retinal tear; SB scleral buckle; PPV pars plana vitrectomy; VH Vitreous hemorrhage; PRP panretinal laser photocoagulation; IVK intravitreal kenalog; VMT vitreomacular traction; MH Macular hole;  NVD neovascularization of the disc; NVE neovascularization elsewhere; AREDS age related eye disease study; ARMD age related macular degeneration; POAG primary open angle glaucoma; EBMD epithelial/anterior basement membrane dystrophy; ACIOL anterior chamber intraocular lens; IOL intraocular lens; PCIOL posterior chamber intraocular lens; Phaco/IOL phacoemulsification with intraocular lens placement; PRK photorefractive keratectomy; LASIK laser assisted in situ keratomileusis; HTN hypertension; DM diabetes mellitus; COPD chronic obstructive pulmonary disease

## 2024-10-29 ENCOUNTER — Encounter: Payer: Self-pay | Admitting: Family Medicine

## 2024-10-29 ENCOUNTER — Telehealth: Payer: Self-pay | Admitting: Family Medicine

## 2024-10-29 ENCOUNTER — Ambulatory Visit (INDEPENDENT_AMBULATORY_CARE_PROVIDER_SITE_OTHER): Payer: Self-pay | Admitting: Family Medicine

## 2024-10-29 VITALS — BP 163/73 | HR 73 | Temp 97.2°F | Ht 62.0 in | Wt 174.5 lb

## 2024-10-29 DIAGNOSIS — M8580 Other specified disorders of bone density and structure, unspecified site: Secondary | ICD-10-CM | POA: Diagnosis not present

## 2024-10-29 DIAGNOSIS — I10 Essential (primary) hypertension: Secondary | ICD-10-CM | POA: Diagnosis not present

## 2024-10-29 DIAGNOSIS — Z23 Encounter for immunization: Secondary | ICD-10-CM | POA: Diagnosis not present

## 2024-10-29 DIAGNOSIS — E782 Mixed hyperlipidemia: Secondary | ICD-10-CM

## 2024-10-29 DIAGNOSIS — N1831 Chronic kidney disease, stage 3a: Secondary | ICD-10-CM | POA: Diagnosis not present

## 2024-10-29 DIAGNOSIS — I480 Paroxysmal atrial fibrillation: Secondary | ICD-10-CM

## 2024-10-29 DIAGNOSIS — Z0000 Encounter for general adult medical examination without abnormal findings: Secondary | ICD-10-CM

## 2024-10-29 DIAGNOSIS — Z0001 Encounter for general adult medical examination with abnormal findings: Secondary | ICD-10-CM

## 2024-10-29 DIAGNOSIS — C182 Malignant neoplasm of ascending colon: Secondary | ICD-10-CM

## 2024-10-29 DIAGNOSIS — E66811 Obesity, class 1: Secondary | ICD-10-CM | POA: Insufficient documentation

## 2024-10-29 LAB — BAYER DCA HB A1C WAIVED: HB A1C (BAYER DCA - WAIVED): 6 % — ABNORMAL HIGH (ref 4.8–5.6)

## 2024-10-29 MED ORDER — ELIQUIS 5 MG PO TABS: 5.0000 mg | ORAL_TABLET | Freq: Two times a day (BID) | ORAL | 3 refills | Status: AC

## 2024-10-29 MED ORDER — HYDRALAZINE HCL 25 MG PO TABS: 25.0000 mg | ORAL_TABLET | Freq: Two times a day (BID) | ORAL | 3 refills | Status: AC

## 2024-10-29 MED ORDER — AMLODIPINE BESYLATE 10 MG PO TABS: 10.0000 mg | ORAL_TABLET | Freq: Every day | ORAL | 3 refills | Status: AC

## 2024-10-29 MED ORDER — LOSARTAN POTASSIUM 100 MG PO TABS
100.0000 mg | ORAL_TABLET | Freq: Every day | ORAL | 3 refills | Status: AC
Start: 2024-10-29 — End: ?

## 2024-10-29 MED ORDER — ATORVASTATIN CALCIUM 20 MG PO TABS: 20.0000 mg | ORAL_TABLET | Freq: Every day | ORAL | 3 refills | Status: AC

## 2024-10-29 MED ORDER — POTASSIUM CHLORIDE ER 20 MEQ PO TBCR: 20.0000 meq | EXTENDED_RELEASE_TABLET | Freq: Every day | ORAL | 3 refills | Status: AC

## 2024-10-29 NOTE — Patient Instructions (Signed)
 I have increased the Losartan  to 100mg  daily for your blood pressure. When you come back in for a blood pressure check, you will need to have blood work repeated for your kidneys.

## 2024-10-29 NOTE — Telephone Encounter (Signed)
 Pt needs appt for DEXA

## 2024-10-29 NOTE — Progress Notes (Signed)
 Gabrielle [name] is a 78 y.o. female presents to office today for annual physical exam examination.    She reports that she has been doing okay.  She will not be having to do any infusions until February.  She does report some concern about her port, she was told that she needed to have it flushed every 3 months but was not given an interval appointment for that and was not sure what to do.  She was graduated to 46-month follow-up by Dr. Rogers recently and will be starting to see Dr. Keren at that time.  She reports compliance with her antihypertensives but did not take anything prior to today's visit.  She reports her blood pressures have been fluctuating in the higher ranges over the last several blood pressure checks however.  She is compliant with losartan  50 mg, amlodipine  10 mg and hydralazine  25 mg twice daily.  She is compliant with her anticoagulant and does not report any abnormal bleeding.  She continues to see Dr. Zamora for macular degeneration injections every few weeks.  She reports a balanced diet and stays physically active walking regularly.  She reports no abdominal pain, nausea, vomiting or other concerns related to treatment for her cancer.  Occupation: retired, Substance use: none Health Maintenance Due  Topic Date Due   Bone Density Scan  07/23/2023   COVID-19 Vaccine (4 - 2025-26 season) 08/05/2024    Immunization History  Administered Date(s) Administered   Fluad Quad(high Dose 65+) 08/26/2020, 11/16/2021, 09/06/2022   INFLUENZA, HIGH DOSE SEASONAL PF 11/19/2019, 10/29/2024   Influenza Split 01/25/2018   Influenza, Seasonal, Injecte, Preservative Fre 11/19/2019   Influenza,inj,Quad PF,6+ Mos 01/25/2018   Moderna Sars-Covid-2 Vaccination 01/14/2020, 02/11/2020, 10/06/2020   Pneumococcal Conjugate-13 01/25/2018   Pneumococcal Polysaccharide-23 06/24/2019   Td 04/12/2010   Td (Adult), 2 Lf Tetanus Toxid, Preservative Free 04/12/2010   Td (Adult),5 Lf Tetanus  Toxid, Preservative Free 04/12/2010   Tdap 04/12/2010, 04/24/2023   Zoster Recombinant(Shingrix) 11/19/2019, 11/19/2019, 03/20/2020, 03/20/2020   Past Medical History:  Diagnosis Date   Arthritis    Cancer (HCC)    Cataract    OU   Colon cancer (HCC)    Family history of pancreatic cancer 08/21/2020   Family history of uterine cancer 08/21/2020   Gastrointestinal hemorrhage    H/O cesarean section    Hx of tonsillectomy    Hyperlipidemia    Hypertension    Kidney disease    Macular degeneration    Exu ARMD OU   Paroxysmal atrial fibrillation (HCC) 07/05/2020   Stroke (cerebrum) (HCC) 07/05/2020   Urinary retention 04/15/2020   Social History   Socioeconomic History   Marital status: Divorced    Spouse name: Not on file   Number of children: Not on file   Years of education: Not on file   Highest education level: Not on file  Occupational History   Not on file  Tobacco Use   Smoking status: Never   Smokeless tobacco: Never  Vaping Use   Vaping status: Never Used  Substance and Sexual Activity   Alcohol use: No   Drug use: No   Sexual activity: Not Currently  Other Topics Concern   Not on file  Social History Narrative   She no longer drives due to macular degeneration.  Either her sister or daughter brings her to appointments.  She attends church regularly at [employer], which she has attended since childhood.   Wears hearing aids and glasses.  Social Drivers of Corporate Investment Banker Strain: Low Risk  (03/15/2024)   Overall Financial Resource Strain (CARDIA)    Difficulty of Paying Living Expenses: Not hard at all  Food Insecurity: No Food Insecurity (03/15/2024)   Hunger Vital Sign    Worried About Running Out of Food in the Last Year: Never true    Ran Out of Food in the Last Year: Never true  Transportation Needs: No Transportation Needs (03/15/2024)   PRAPARE - Administrator, Civil Service (Medical): No    Lack of Transportation  (Non-Medical): No  Physical Activity: Sufficiently Active (03/15/2024)   Exercise Vital Sign    Days of Exercise per Week: 7 days    Minutes of Exercise per Session: 30 min  Stress: No Stress Concern Present (03/15/2024)   [employer] of Occupational Health - Occupational Stress Questionnaire    Feeling of Stress : Not at all  Social Connections: Moderately Isolated (03/15/2024)   Social Connection and Isolation Panel    Frequency of Communication with Friends and Family: More than three times a week    Frequency of Social Gatherings with Friends and Family: More than three times a week    Attends Religious Services: More than 4 times per year    Active Member of [employer] or Organizations: No    Attends Banker Meetings: Never    Marital Status: Widowed  Intimate Partner Violence: Not At Risk (03/15/2024)   Humiliation, Afraid, Rape, and Kick questionnaire    Fear of Current or Ex-Partner: No    Emotionally Abused: No    Physically Abused: No    Sexually Abused: No   Past Surgical History:  Procedure Laterality Date   BIOPSY  04/06/2020   Procedure: BIOPSY;  Surgeon: Golda Claudis PENNER, MD;  Location: [facility];  Service: Endoscopy;;   CARPAL TUNNEL RELEASE Right    CATARACT EXTRACTION W/PHACO Left 10/11/2019   Procedure: CATARACT EXTRACTION PHACO AND INTRAOCULAR LENS PLACEMENT (IOC);  Surgeon: Harrie Agent, MD;  Location: [facility];  Service: Ophthalmology;  Laterality: Left;  CDE: 8.56   CATARACT EXTRACTION W/PHACO Right 10/25/2019   Procedure: CATARACT EXTRACTION PHACO AND INTRAOCULAR LENS PLACEMENT (IOC);  Surgeon: Harrie Agent, MD;  Location: [facility];  Service: Ophthalmology;  Laterality: Right;  CDE: 5.67   CESAREAN SECTION     COLONOSCOPY N/A 04/06/2020   Procedure: COLONOSCOPY;  Surgeon: Golda Claudis PENNER, MD;  Location: [facility];  Service: Endoscopy;  Laterality: N/A;   CYSTOSCOPY WITH BIOPSY N/A 04/08/2020   Procedure: CYSTOSCOPY WITH BIOPSY;  Surgeon:  Sherrilee Belvie CROME, MD;  Location: [facility];  Service: Urology;  Laterality: N/A;   ESOPHAGOGASTRODUODENOSCOPY N/A 04/05/2020   Procedure: ESOPHAGOGASTRODUODENOSCOPY (EGD);  Surgeon: Golda Claudis PENNER, MD;  Location: [facility];  Service: Endoscopy;  Laterality: N/A;   PARTIAL COLECTOMY N/A 04/08/2020   Procedure: PARTIAL COLECTOMY;  Surgeon: Kallie Manuelita BROCKS, MD;  Location: [facility];  Service: General;  Laterality: N/A;   PORTACATH PLACEMENT Left 05/18/2020   Procedure: INSERTION PORT-A-CATH (ATTACHED CATHETER IN LEFT SUBCLAVIAN);  Surgeon: Kallie Manuelita BROCKS, MD;  Location: [facility];  Service: General;  Laterality: Left;   TONSILLECTOMY     Family History  Problem Relation Age of Onset   Macular degeneration Mother    Stroke Mother    Hypertension Mother    Dementia Mother    Lung cancer Father        dx late 53s; smoking hx  Diabetes Sister    Hypertension Sister    Heart attack Brother    Cancer Maternal Aunt        ovarian or endometrial dx 48s   Leukemia Paternal Uncle        d. 58s   Pancreatic cancer Paternal Uncle        d. late 59s    Current Outpatient Medications:    alendronate  (FOSAMAX ) 70 MG tablet, TAKE ONE TABLET EVERY 7 DAYS ON AN EMPTY STOMACH WITH A FULL GLASS OF WATER , Disp: 4 tablet, Rfl: 3   cholecalciferol  (VITAMIN D3) 25 MCG (1000 UT) tablet, Take 1,000 Units by mouth daily., Disp: , Rfl:    CRANBERRY FRUIT PO, Take 1 tablet by mouth daily., Disp: , Rfl:    diclofenac  Sodium (VOLTAREN ) 1 % GEL, APPLY 4 GRAMS TO AFFECTED AREA(S) 4 TIMES A DAY, Disp: 400 g, Rfl: 2   dorzolamide -timolol  (COSOPT ) 2-0.5 % ophthalmic solution, INSTILL ONE DROP IN THE RIGHT EYE TWICE DAILY, Disp: 30 mL, Rfl: 11   Ferrous Sulfate  (IRON ) 325 (65 Fe) MG TABS, Take 1 tablet (325 mg total) by mouth daily., Disp: 90 tablet, Rfl: 3   fluticasone  (FLONASE ) 50 MCG/ACT nasal spray, PLACE 2 SPRAYS INTO EACH NOSTRIL ONCE A DAY, Disp: 16 g, Rfl: 6   losartan  (COZAAR ) 100 MG tablet, Take 1 tablet  (100 mg total) by mouth daily., Disp: 100 tablet, Rfl: 3   Multiple Vitamins-Minerals (CENTRUM ADULT PO), Take 1 tablet by mouth daily., Disp: , Rfl:    amLODipine  (NORVASC ) 10 MG tablet, Take 1 tablet (10 mg total) by mouth daily., Disp: 100 tablet, Rfl: 3   atorvastatin  (LIPITOR) 20 MG tablet, Take 1 tablet (20 mg total) by mouth daily., Disp: 100 tablet, Rfl: 3   ELIQUIS  5 MG TABS tablet, Take 1 tablet (5 mg total) by mouth 2 (two) times daily., Disp: 200 tablet, Rfl: 3   hydrALAZINE  (APRESOLINE ) 25 MG tablet, Take 1 tablet (25 mg total) by mouth 2 (two) times daily., Disp: 200 tablet, Rfl: 3   Potassium Chloride  ER 20 MEQ TBCR, Take 1 tablet (20 mEq total) by mouth daily. Take while taking hydrochlorothiazide /HCTZ, Disp: 100 tablet, Rfl: 3  Allergies  Allergen Reactions   Lisinopril  Cough     ROS: Review of Systems Pertinent items noted in HPI and remainder of comprehensive ROS otherwise negative.    Physical exam BP (!) 163/73   Pulse 73   Temp (!) 97.2 F (36.2 C)   Ht 5' 2 (1.575 m)   Wt 174 lb 8 oz (79.2 kg)   SpO2 97%   BMI 31.92 kg/m  General appearance: alert, cooperative, appears stated age, no distress, and mildly obese Head: Normocephalic, without obvious abnormality, atraumatic Eyes: negative findings: lids and lashes normal, conjunctivae and sclerae normal, corneas clear, and pupils equal, round, reactive to light and accomodation Ears: Wears hearing aids.  TMs intact bilaterally with normal light reflex.  Scant dried skin and cerumen in the external auditory canals but this is nonobstructive Nose: Nares normal. Septum midline. Mucosa normal. No drainage or sinus tenderness. Throat: lips, mucosa, and tongue normal; teeth and gums normal Neck: no adenopathy, no carotid bruit, supple, symmetrical, trachea midline, and thyroid  not enlarged, symmetric, no tenderness/mass/nodules Back: Slight increased kyphosis of thoracic spine but able to lie flat.  Ambulates  independently Lungs: clear to auscultation bilaterally Heart: regular rate and rhythm, S1, S2 normal, no murmur, click, rub or gallop Abdomen: soft, non-tender; bowel sounds normal; no  masses,  no organomegaly Extremities: extremities normal, atraumatic, no cyanosis or edema Pulses: 2+ and symmetric Skin: Skin color, texture, turgor normal. No rashes or lesions Lymph nodes: No enlargement of the anterior cervical or supraclavicular lymph nodes Neurologic: Decreased hearing requiring hearing aids, otherwise appears to be neurologically intact with normal and symmetric patellar reflex     10/29/2024   11:09 AM 07/09/2024    3:05 PM 03/15/2024   11:49 AM  Depression screen PHQ 2/9  Decreased Interest 0 0 0  Down, Depressed, Hopeless 0 0 0  PHQ - 2 Score 0 0 0  Altered sleeping 0  0  Tired, decreased energy 0  0  Change in appetite 0  0  Feeling bad or failure about yourself  0  0  Trouble concentrating 0  0  Moving slowly or fidgety/restless 0  0  Suicidal thoughts 0  0  PHQ-9 Score 0  0   Difficult doing work/chores Not difficult at all  Not difficult at all     Data saved with a previous flowsheet row definition      10/29/2024   11:09 AM 07/24/2023    3:15 PM 06/26/2023   10:01 AM 04/24/2023   10:09 AM  GAD 7 : Generalized Anxiety Score  Nervous, Anxious, on Edge 0 0 0 0  Control/stop worrying 0 0 0 0  Worry too much - different things 0 0 0 0  Trouble relaxing 0 0 0 0  Restless 0 0 0 0  Easily annoyed or irritable 0 0 0 0  Afraid - awful might happen 0 0 0 0  Total GAD 7 Score 0 0 0 0  Anxiety Difficulty Not difficult at all Not difficult at all Not difficult at all Not difficult at all    No results found for this or any previous visit (from the past 2160 hours).   Assessment/ Plan: Erminio MARLA Bologna here for annual physical exam.   Annual physical exam  Osteopenia with high risk of fracture - Plan: CMP14+EGFR, VITAMIN D  25 Hydroxy (Vit-D Deficiency, Fractures), DG  [facility] DEXA  Paroxysmal atrial fibrillation (HCC) - Plan: CMP14+EGFR, Magnesium , ELIQUIS  5 MG TABS tablet  Essential hypertension - Plan: CMP14+EGFR, amLODipine  (NORVASC ) 10 MG tablet, hydrALAZINE  (APRESOLINE ) 25 MG tablet, Potassium Chloride  ER 20 MEQ TBCR  Chronic kidney disease, stage 3a (HCC) - Plan: CMP14+EGFR, VITAMIN D  25 Hydroxy (Vit-D Deficiency, Fractures)  Mixed hyperlipidemia - Plan: CMP14+EGFR, TSH, Lipid Panel, atorvastatin  (LIPITOR) 20 MG tablet  Malignant neoplasm of ascending colon (HCC) - Plan: CMP14+EGFR  Obesity (BMI 30.0-34.9) - Plan: Bayer DCA Hb A1c Waived  Encounter for immunization - Plan: Flu vaccine HIGH DOSE PF(Fluzone Trivalent)   Influenza vaccination administered.  Will get her set up for DEXA scan.  Fasting labs collected today.  Both rate and rhythm controlled on exam today.  Continue blood thinner as directed.  Check magnesium  level and electrolytes  Blood pressure not at goal and given CKD 3A, will advance ARB in efforts to achieve better control.  I reviewed Dr. Charmain last note he was under the impression that she was on Hyzaar but I called the pharmacy personally and they noted that she was only on losartan  50 mg.  Will advance this to 100 mg in efforts to achieve better blood pressure control and she will follow-up with nursing next Friday for blood pressure recheck and repeat BMP given adjustment in the ARB.  Check vitamin D  level.  Will defer CBC to oncology since  this is checked frequently  She will continue statin as directed.  Reinforced balanced lifestyle and regular physical activity  Counseled on healthy lifestyle choices, including diet (rich in fruits, vegetables and lean meats and low in salt and simple carbohydrates) and exercise (at least 30 minutes of moderate physical activity daily).  Patient to follow up 36m for BP check with me.  Naoma Boxell M. Jolinda, DO

## 2024-10-30 ENCOUNTER — Other Ambulatory Visit: Payer: Self-pay

## 2024-10-30 ENCOUNTER — Encounter: Payer: Self-pay | Admitting: Oncology

## 2024-10-30 ENCOUNTER — Ambulatory Visit: Payer: Self-pay | Admitting: Family Medicine

## 2024-10-30 LAB — CMP14+EGFR
ALT: 26 IU/L (ref 0–32)
AST: 32 IU/L (ref 0–40)
Albumin: 4.6 g/dL (ref 3.8–4.8)
Alkaline Phosphatase: 112 IU/L (ref 49–135)
BUN/Creatinine Ratio: 14 (ref 12–28)
BUN: 15 mg/dL (ref 8–27)
Bilirubin Total: 0.4 mg/dL (ref 0.0–1.2)
CO2: 24 mmol/L (ref 20–29)
Calcium: 10 mg/dL (ref 8.7–10.3)
Chloride: 100 mmol/L (ref 96–106)
Creatinine, Ser: 1.08 mg/dL — ABNORMAL HIGH (ref 0.57–1.00)
Globulin, Total: 3 g/dL (ref 1.5–4.5)
Glucose: 112 mg/dL — ABNORMAL HIGH (ref 70–99)
Potassium: 3.6 mmol/L (ref 3.5–5.2)
Sodium: 143 mmol/L (ref 134–144)
Total Protein: 7.6 g/dL (ref 6.0–8.5)
eGFR: 53 mL/min/1.73 — ABNORMAL LOW (ref >59)

## 2024-10-30 LAB — LIPID PANEL
Chol/HDL Ratio: 3.1 ratio (ref 0.0–4.4)
Cholesterol, Total: 172 mg/dL (ref 100–199)
HDL: 56 mg/dL (ref >39)
LDL Chol Calc (NIH): 78 mg/dL (ref 0–99)
Triglycerides: 232 mg/dL — ABNORMAL HIGH (ref 0–149)
VLDL Cholesterol Cal: 38 mg/dL (ref 5–40)

## 2024-10-30 LAB — MAGNESIUM: Magnesium: 2.3 mg/dL (ref 1.6–2.3)

## 2024-10-30 LAB — VITAMIN D 25 HYDROXY (VIT D DEFICIENCY, FRACTURES): Vit D, 25-Hydroxy: 59.2 ng/mL (ref 30.0–100.0)

## 2024-10-30 LAB — TSH: TSH: 1.5 u[IU]/mL (ref 0.450–4.500)

## 2024-10-30 NOTE — Telephone Encounter (Signed)
 Called and scheduled appt

## 2024-11-05 ENCOUNTER — Ambulatory Visit

## 2024-11-05 DIAGNOSIS — M8580 Other specified disorders of bone density and structure, unspecified site: Secondary | ICD-10-CM | POA: Diagnosis not present

## 2024-11-06 ENCOUNTER — Encounter (INDEPENDENT_AMBULATORY_CARE_PROVIDER_SITE_OTHER): Admitting: Ophthalmology

## 2024-11-06 DIAGNOSIS — H353231 Exudative age-related macular degeneration, bilateral, with active choroidal neovascularization: Secondary | ICD-10-CM

## 2024-11-06 DIAGNOSIS — Z961 Presence of intraocular lens: Secondary | ICD-10-CM

## 2024-11-06 DIAGNOSIS — H04123 Dry eye syndrome of bilateral lacrimal glands: Secondary | ICD-10-CM

## 2024-11-06 DIAGNOSIS — H40053 Ocular hypertension, bilateral: Secondary | ICD-10-CM

## 2024-11-06 DIAGNOSIS — I10 Essential (primary) hypertension: Secondary | ICD-10-CM

## 2024-11-06 DIAGNOSIS — H43813 Vitreous degeneration, bilateral: Secondary | ICD-10-CM

## 2024-11-06 DIAGNOSIS — H35033 Hypertensive retinopathy, bilateral: Secondary | ICD-10-CM

## 2024-11-06 NOTE — Progress Notes (Signed)
 [facility] - Clinic Note  11/13/2024    CHIEF COMPLAINT Patient presents for Retina Follow Up  HISTORY OF PRESENT ILLNESS: Gabrielle [name] is a 78 y.o. female who presents to the clinic today for:   HPI     Retina Follow Up   Patient presents with  Wet AMD.  In both eyes.  This started 8 weeks ago.  I, the attending physician,  performed the HPI with the patient and updated documentation appropriately.        Comments   Patient here for 8 weeks retina follow up for exu ARMD OU. Patient states vision about the same. No eye pain.       Last edited by Valdemar Rogue, MD on 11/13/2024  9:58 PM.     Pt states no vision changes, feels the vision is stable.    Referring physician: Jolinda Norene HERO, DO [address] [place],  [place] [address]  HISTORICAL INFORMATION:   Selected notes from the MEDICAL RECORD NUMBER Referred by Dr. MICAEL Andreas for concern of ARMD OU   CURRENT MEDICATIONS: Current Outpatient Medications (Ophthalmic Drugs)  Medication Sig   dorzolamide -timolol  (COSOPT ) 2-0.5 % ophthalmic solution INSTILL ONE DROP IN THE RIGHT EYE TWICE DAILY   No current facility-administered medications for this visit. (Ophthalmic Drugs)   Current Outpatient Medications (Other)  Medication Sig   alendronate  (FOSAMAX ) 70 MG tablet TAKE ONE TABLET EVERY 7 DAYS ON AN EMPTY STOMACH WITH A FULL GLASS OF WATER    amLODipine  (NORVASC ) 10 MG tablet Take 1 tablet (10 mg total) by mouth daily.   atorvastatin  (LIPITOR) 20 MG tablet Take 1 tablet (20 mg total) by mouth daily.   cholecalciferol  (VITAMIN D3) 25 MCG (1000 UT) tablet Take 1,000 Units by mouth daily.   CRANBERRY FRUIT PO Take 1 tablet by mouth daily.   diclofenac  Sodium (VOLTAREN ) 1 % GEL APPLY 4 GRAMS TO AFFECTED AREA(S) 4 TIMES A DAY   ELIQUIS  5 MG TABS tablet Take 1 tablet (5 mg total) by mouth 2 (two) times daily.   Ferrous Sulfate  (IRON ) 325 (65 Fe) MG TABS Take 1 tablet (325 mg total) by mouth daily.    fluticasone  (FLONASE ) 50 MCG/ACT nasal spray PLACE 2 SPRAYS INTO EACH NOSTRIL ONCE A DAY   hydrALAZINE  (APRESOLINE ) 25 MG tablet Take 1 tablet (25 mg total) by mouth 2 (two) times daily.   losartan  (COZAAR ) 100 MG tablet Take 1 tablet (100 mg total) by mouth daily.   Multiple Vitamins-Minerals (CENTRUM ADULT PO) Take 1 tablet by mouth daily.   Potassium Chloride  ER 20 MEQ TBCR Take 1 tablet (20 mEq total) by mouth daily. Take while taking hydrochlorothiazide /HCTZ   No current facility-administered medications for this visit. (Other)   REVIEW OF SYSTEMS: ROS   Positive for: Gastrointestinal, Genitourinary, Musculoskeletal, Cardiovascular, Eyes Negative for: Constitutional, Neurological, Skin, HENT, Endocrine, Respiratory, Psychiatric, Allergic/Imm, Heme/Lymph Last edited by Orval Asberry RAMAN, COA on 11/13/2024  8:11 AM.     ALLERGIES Allergies  Allergen Reactions   Lisinopril  Cough   PAST MEDICAL HISTORY Past Medical History:  Diagnosis Date   Arthritis    Cancer [facility])    Cataract    OU   Colon cancer (HCC)    Family history of pancreatic cancer 08/21/2020   Family history of uterine cancer 08/21/2020   Gastrointestinal hemorrhage    H/O cesarean section    Hx of tonsillectomy    Hyperlipidemia    Hypertension    Kidney disease  Macular degeneration    Exu ARMD OU   Paroxysmal atrial fibrillation (HCC) 07/05/2020   Stroke (cerebrum) (HCC) 07/05/2020   Urinary retention 04/15/2020   Past Surgical History:  Procedure Laterality Date   BIOPSY  04/06/2020   Procedure: BIOPSY;  Surgeon: Golda Claudis PENNER, MD;  Location: [facility];  Service: Endoscopy;;   CARPAL TUNNEL RELEASE Right    CATARACT EXTRACTION W/PHACO Left 10/11/2019   Procedure: CATARACT EXTRACTION PHACO AND INTRAOCULAR LENS PLACEMENT (IOC);  Surgeon: Harrie Agent, MD;  Location: [facility];  Service: Ophthalmology;  Laterality: Left;  CDE: 8.56   CATARACT EXTRACTION W/PHACO Right 10/25/2019   Procedure:  CATARACT EXTRACTION PHACO AND INTRAOCULAR LENS PLACEMENT (IOC);  Surgeon: Harrie Agent, MD;  Location: [facility];  Service: Ophthalmology;  Laterality: Right;  CDE: 5.67   CESAREAN SECTION     COLONOSCOPY N/A 04/06/2020   Procedure: COLONOSCOPY;  Surgeon: Golda Claudis PENNER, MD;  Location: [facility];  Service: Endoscopy;  Laterality: N/A;   CYSTOSCOPY WITH BIOPSY N/A 04/08/2020   Procedure: CYSTOSCOPY WITH BIOPSY;  Surgeon: Sherrilee Belvie CROME, MD;  Location: [facility];  Service: Urology;  Laterality: N/A;   ESOPHAGOGASTRODUODENOSCOPY N/A 04/05/2020   Procedure: ESOPHAGOGASTRODUODENOSCOPY (EGD);  Surgeon: Golda Claudis PENNER, MD;  [facility];  Service: Endoscopy;  Laterality: N/A;   PARTIAL COLECTOMY N/A 04/08/2020   Procedure: PARTIAL COLECTOMY;  Surgeon: Kallie Manuelita BROCKS, MD;  Location: [facility];  Service: General;  Laterality: N/A;   PORTACATH PLACEMENT Left 05/18/2020   Procedure: INSERTION PORT-A-CATH (ATTACHED CATHETER IN LEFT SUBCLAVIAN);  Surgeon: Kallie Manuelita BROCKS, MD;  Location: [facility];  Service: General;  Laterality: Left;   TONSILLECTOMY     FAMILY HISTORY Family History  Problem Relation Age of Onset   Macular degeneration Mother    Stroke Mother    Hypertension Mother    Dementia Mother    Lung cancer Father        dx late 94s; smoking hx   Diabetes Sister    Hypertension Sister    Heart attack Brother    Cancer Maternal Aunt        ovarian or endometrial dx 26s   Leukemia Paternal Uncle        d. 4s   Pancreatic cancer Paternal Uncle        d. late 71s   SOCIAL HISTORY Social History   Tobacco Use   Smoking status: Never   Smokeless tobacco: Never  Vaping Use   Vaping status: Never Used  Substance Use Topics   Alcohol use: No   Drug use: No       OPHTHALMIC EXAM: Base Eye Exam     Visual Acuity (Snellen - Linear)       Right Left   Dist cc CF at 3' 20/70 -2   Dist ph cc  20/70 +1         Tonometry (Tonopen, 8:00 AM)       Right Left    Pressure 17 14         Pupils       Dark Light Shape React APD   Right 3 2 Round Brisk None   Left 3 2 Round Brisk None         Visual Fields (Counting fingers)       Left Right    Full Full         Extraocular Movement       Right Left    Full,  Ortho Full, Ortho         Neuro/Psych     Oriented x3: Yes   Mood/Affect: Normal         Dilation     Both eyes: 1.0% Mydriacyl, 2.5% Phenylephrine  @ 8:00 AM           Slit Lamp and Fundus Exam     Slit Lamp Exam       Right Left   Lids/Lashes Dermatochalasis - upper lid Dermatochalasis - upper lid   Conjunctiva/Sclera 1+ Injection Trace Injection, +mucus   Cornea Arcus, 1-2+ fine Punctate epithelial erosions Arcus, 1-2+ Punctate epithelial erosions, mild tear film debris, trace endo pigment, high tear lake   Anterior Chamber Deep and quiet Deep and quiet   Iris Round and well dilated Round and well dilated   Lens PCIOL in good position, 1+ Posterior capsular opacification PC IOL in good postion with open PC   Anterior Vitreous Vitreous syneresis, Posterior vitreous detachment, vitreous condensations Vitreous syneresis, Posterior vitreous detachment, silicone oil micro drops         Fundus Exam       Right Left   Disc pink and sharp, compact mild pallor, sharp rim, Compact, vascular loops superiorly   C/D Ratio 0.2 0.2   Macula Blunted foveal reflex, large central GA, Drusen, Pigment clumping and atrophy, central thickening/pigmented disciform scar, +PED, persistent trace cystic changes overlying central scar, no heme Blunted foveal reflex, central thickening, RPE clumping and atrophy, Drusen, trace cystic changes overlying PED -- persistent, no frank heme   Vessels Vascular attenuation, Tortuous Vascular attenuation, Tortuous   Periphery Attached, mild Reticular degeneration Attached, mild Reticular degeneration           Refraction     Wearing Rx       Sphere Cylinder Add   Right [place] Sphere  +3.50   Left -0.50 Sphere +3.50    Type: PAL           IMAGING AND PROCEDURES  Imaging and Procedures for 03/21/18  OCT, Retina - OU - Both Eyes       Right Eye Quality was good. Central Foveal Thickness: 496. Progression has been stable. Findings include no SRF, abnormal foveal contour, retinal drusen , outer retinal tubulation, subretinal hyper-reflective material, disciform scar, epiretinal membrane, intraretinal fluid, pigment epithelial detachment, outer retinal atrophy (Persistent cystic changes overlying stable central SRHM / disciform scar).   Left Eye Quality was good. Central Foveal Thickness: 283. Progression has been stable. Findings include no SRF, abnormal foveal contour, retinal drusen , subretinal hyper-reflective material, intraretinal fluid, pigment epithelial detachment, outer retinal atrophy (Trace persistent cystic changes nasal fovea and macula overlying stable PED/SRHM).   Notes *Images captured and stored on drive  Diagnosis / Impression:  Exudative ARMD OU OD: persistent cystic changes overlying stable central SRHM / disciform scar  OS: trace persistent cystic changes nasal fovea and macula overlying stable PED/SRHM   Clinical management:  See below  Abbreviations: NFP - Normal foveal profile. CME - cystoid macular edema. PED - pigment epithelial detachment. IRF - intraretinal fluid. SRF - subretinal fluid. EZ - ellipsoid zone. ERM - epiretinal membrane. ORA - outer retinal atrophy. ORT - outer retinal tubulation. SRHM - subretinal hyper-reflective material      Intravitreal Injection, Pharmacologic Agent - OS - Left Eye       Time Out 11/13/2024. 8:42 AM. Confirmed correct patient, procedure, site, and patient consented.   Anesthesia Topical anesthesia was used. Anesthetic medications included Lidocaine   2%, Proparacaine 0.5%.   Procedure Preparation included 5% betadine  to ocular surface, eyelid speculum. A supplied (32g) needle was used.    Injection: 6 mg faricimab -svoa 6 MG/0.05ML   Route: Intravitreal, Site: Left Eye   NDC: 49757-903-93, Lot: [id number], Expiration date: 01/04/2026, Waste: 0 mL   Post-op Post injection exam found visual acuity of at least counting fingers. The patient tolerated the procedure well. There were no complications. The patient received written and verbal post procedure care education. Post injection medications were not given.            ASSESSMENT/PLAN:    ICD-10-CM   1. Exudative age-related macular degeneration of both eyes with active choroidal neovascularization (HCC)  H35.3231 OCT, Retina - OU - Both Eyes    Intravitreal Injection, Pharmacologic Agent - OS - Left Eye    faricimab -svoa (VABYSMO ) 6mg /0.65mL intravitreal injection    2. Posterior vitreous detachment of both eyes  H43.813     3. Essential hypertension  I10     4. Hypertensive retinopathy of both eyes  H35.033     5. Pseudophakia of both eyes  Z96.1     6. Ocular hypertension, bilateral  H40.053     7. Dry eyes  H04.123      1. Exudative age-related macular degeneration, both eyes - severe exudative disease with very active CNVM OU at presentation in January 2019  - S/P IVA OD #1 (01.04.19), #2 (02.15.19)  - S/P IVA OS #1 (01.18.19), #2 (02.15.19)  - switched to Eylea  3.18.19 due to severity of disease  =================== - S/P IVE OD #1 (03.18.19), #2 (04.17.19), #3 (05.15.19), #4 (10.02.19),  #5 (01.15.20), #6 (06.04.20), #7 (10.29.20), #8 (01.21.21), #9 (05.27.21), #10 (07.22.21) -- injections held due to stable disciform scar, #11 (08.12.24) for new IRH, #12 (09.25.24), #13 (11.06.24) - S/P IVE OS #1 (03.18.19), #2 (04.17.19), #3 (05.15.19), #4 (06.12.19), #5 (07.10.19), #6 (08.07.19), #7 (09.04.19), #8 (10.02.19), #9 (11.06.19), #10 (12.11.19), #11 (01.15.20), #12 (02.20.20), #13 (03.26.20), #14 (04.30.20), #15 (06.04.20), #16 (07.16.20), # 17 (08.20.20), #18 (09.24.20), #19 (10.29.20), #20 (12.03.20), #21  (01.21.21), #22 (03.18.21), #23 (05.27.21), #24 (07.22.21), #25 (11.11.21), #26 (12.09.21), #27 (01.06.22), #28 (2.7.22), #29 (03.24.22), #30 (4.28.22), #31 (05.31.22), #32 (06.30.22), #33 (08.04.22), #34 (09.15.22), #35 (10.27.22), #36 (12.08.22), #37 (01.12.23), #38 (02.16.23), #39 (03.30.23), #40 (05.11.23), #41 (06.22.23), #42 (07.27.23), #43 (09.01.23), #44 (10.06.23), #45 (11.10.23), #46 (12.15.23)  -- IVE resistance ================= - s/p IVV OS #1 (01.12.24 -- sample), #2 (02.09.24), #3 (03.15.24), #4 (04.19.24), #5 (05.24.24), #6 (07.01.24), #7 (08.12.24), #8 (09.25.24), #9 (11.06.24), #10 (12.18.24), #11 (01.29.25), #12 (03.12.25), #13 (04.23.25) #14 (06.04.25), #15 (07.16.25), #16 (08.27.25), #17 (10.15.25) - IVE OS held from 7.22.21 to 11.11.21 due to TIA/stroke on 8.1.21 - OCT OD: persistent cystic changes overlying stable central SRHM / disciform scar; OS: trace persistent cystic changes nasal fovea and macula overlying stable PED/SRHM at 8 weeks - exam OD with focal IRH superior macula within atrophy -- improved  - BCVA OD stable at CF 3'; OS 20/70 from 20/60  - recommend IVV OS #18 today, 12.10.25 w/ f/u in 8 wks - IVV authorization obtained and Good Days reinstated - will hold IVE OD today -- pt in agreement - RBA of procedure discussed, questions answered - IVV informed consent obtained and signed on 01.29.25 - see procedure note  - [employer] funding reinstated for Ms. Tseng as of 03.12.25 visit - f/u in 8 weeks, sooner prn for DFE/OCT/possible injection(s)  2. PVD / vitreous syneresis  -  Discussed findings and prognosis   - No RT or RD on 360 exam  - Reviewed s/s of RT/RD  - strict return precautions for any such RT/RD symptoms  3,4. Hypertensive retinopathy OU - exam shows persistent focal IRH OD within GA - discussed importance of tight BP control - monitor  5. Pseudophakia OU  - s/p CE/IOL (Dr. Harrie, 11.2020) - beautiful surgeries w/ IOLs in excellent  position, doing well  - monitor  6. Ocular Hypertension OU  - IOP 17, 14  - continue cosopt  bid OU  7. Dry eyes OU - recommend artificial tears and lubricating ointment as needed  Ophthalmic Meds Ordered this visit:  Meds ordered this encounter  Medications   faricimab -svoa (VABYSMO ) 6mg /0.59mL intravitreal injection     Return in about 8 weeks (around 01/08/2025) for f/u, Ex. AMD, DFE, OCT, Possible, IVV, OS.  There are no Patient Instructions on file for this visit.  This document serves as a record of services personally performed by Redell JUDITHANN Hans, MD, PhD. It was created on their behalf by Almetta Pesa, an ophthalmic technician. The creation of this record is the provider's dictation and/or activities during the visit.    Electronically signed by: Almetta Pesa, OA, 11/13/24  9:59 PM  This document serves as a record of services personally performed by Redell JUDITHANN Hans, MD, PhD. It was created on their behalf by Wanda GEANNIE Keens, COT an ophthalmic technician. The creation of this record is the provider's dictation and/or activities during the visit.    Electronically signed by:  Wanda GEANNIE Keens, COT  11/13/24 9:59 PM  Redell JUDITHANN Hans, M.D., Ph.D. Diseases & Surgery of the Retina and Vitreous [facility]  I have reviewed the above documentation for accuracy and completeness, and I agree with the above. Redell JUDITHANN Hans, M.D., Ph.D. 11/13/24 10:09 PM   Abbreviations: M myopia (nearsighted); A astigmatism; H hyperopia (farsighted); P presbyopia; Mrx spectacle prescription;  CTL contact lenses; OD right eye; OS left eye; OU both eyes  XT exotropia; ET esotropia; PEK punctate epithelial keratitis; PEE punctate epithelial erosions; DES dry eye syndrome; MGD meibomian gland dysfunction; ATs artificial tears; PFAT's preservative free artificial tears; NSC nuclear sclerotic cataract; PSC posterior subcapsular cataract; ERM epi-retinal membrane; PVD  posterior vitreous detachment; RD retinal detachment; DM diabetes mellitus; DR diabetic retinopathy; NPDR non-proliferative diabetic retinopathy; PDR proliferative diabetic retinopathy; CSME clinically significant macular edema; DME diabetic macular edema; dbh dot blot hemorrhages; CWS cotton wool spot; POAG primary open angle glaucoma; C/D cup-to-disc ratio; HVF humphrey visual field; GVF goldmann visual field; OCT optical coherence tomography; IOP intraocular pressure; BRVO Branch retinal vein occlusion; CRVO central retinal vein occlusion; CRAO central retinal artery occlusion; BRAO branch retinal artery occlusion; RT retinal tear; SB scleral buckle; PPV pars plana vitrectomy; VH Vitreous hemorrhage; PRP panretinal laser photocoagulation; IVK intravitreal kenalog; VMT vitreomacular traction; MH Macular hole;  NVD neovascularization of the disc; NVE neovascularization elsewhere; AREDS age related eye disease study; ARMD age related macular degeneration; POAG primary open angle glaucoma; EBMD epithelial/anterior basement membrane dystrophy; ACIOL anterior chamber intraocular lens; IOL intraocular lens; PCIOL posterior chamber intraocular lens; Phaco/IOL phacoemulsification with intraocular lens placement; PRK photorefractive keratectomy; LASIK laser assisted in situ keratomileusis; HTN hypertension; DM diabetes mellitus; COPD chronic obstructive pulmonary disease

## 2024-11-08 ENCOUNTER — Ambulatory Visit

## 2024-11-08 DIAGNOSIS — M8589 Other specified disorders of bone density and structure, multiple sites: Secondary | ICD-10-CM | POA: Diagnosis not present

## 2024-11-08 DIAGNOSIS — Z780 Asymptomatic menopausal state: Secondary | ICD-10-CM | POA: Diagnosis not present

## 2024-11-13 ENCOUNTER — Encounter (INDEPENDENT_AMBULATORY_CARE_PROVIDER_SITE_OTHER): Payer: Self-pay | Admitting: Ophthalmology

## 2024-11-13 ENCOUNTER — Ambulatory Visit (INDEPENDENT_AMBULATORY_CARE_PROVIDER_SITE_OTHER): Admitting: Ophthalmology

## 2024-11-13 DIAGNOSIS — H35033 Hypertensive retinopathy, bilateral: Secondary | ICD-10-CM | POA: Diagnosis not present

## 2024-11-13 DIAGNOSIS — H353231 Exudative age-related macular degeneration, bilateral, with active choroidal neovascularization: Secondary | ICD-10-CM

## 2024-11-13 DIAGNOSIS — H04123 Dry eye syndrome of bilateral lacrimal glands: Secondary | ICD-10-CM | POA: Diagnosis not present

## 2024-11-13 DIAGNOSIS — Z961 Presence of intraocular lens: Secondary | ICD-10-CM

## 2024-11-13 DIAGNOSIS — I10 Essential (primary) hypertension: Secondary | ICD-10-CM | POA: Diagnosis not present

## 2024-11-13 DIAGNOSIS — H40053 Ocular hypertension, bilateral: Secondary | ICD-10-CM

## 2024-11-13 DIAGNOSIS — H43813 Vitreous degeneration, bilateral: Secondary | ICD-10-CM

## 2024-11-13 MED ORDER — FARICIMAB-SVOA 6 MG/0.05ML IZ SOSY
6.0000 mg | PREFILLED_SYRINGE | INTRAVITREAL | Status: AC | PRN
  Administered 2024-11-13: 6 mg via INTRAVITREAL

## 2024-12-02 ENCOUNTER — Encounter: Payer: Self-pay | Admitting: *Deleted

## 2024-12-27 NOTE — Progress Notes (Signed)
 " [facility] - Clinic Note  01/08/2025    CHIEF COMPLAINT Patient presents for Retina Follow Up  HISTORY OF PRESENT ILLNESS: Gabrielle [name] is a 79 y.o. female who presents to the clinic today for:   HPI     Retina Follow Up   Patient presents with  Wet AMD.  In both eyes.  This started 8 weeks ago.  I, the attending physician,  performed the HPI with the patient and updated documentation appropriately.        Comments   Patient here for 8 weeks retina follow up for exu ARMD OU. Patient states vision about the same. No eye pain. Pt denies Fol/floaters.       Last edited by Valdemar Rogue, MD on 01/08/2025 12:58 PM.     Pt states the vision is the same. She is not having any health issues.   Referring physician: Jolinda Norene HERO, DO [address] [place],  [place] [address]  HISTORICAL INFORMATION:   Selected notes from the MEDICAL RECORD NUMBER Referred by Dr. MICAEL Andreas for concern of ARMD OU   CURRENT MEDICATIONS: Current Outpatient Medications (Ophthalmic Drugs)  Medication Sig   dorzolamide -timolol  (COSOPT ) 2-0.5 % ophthalmic solution INSTILL ONE DROP IN THE RIGHT EYE TWICE DAILY   No current facility-administered medications for this visit. (Ophthalmic Drugs)   Current Outpatient Medications (Other)  Medication Sig   alendronate  (FOSAMAX ) 70 MG tablet TAKE ONE TABLET EVERY 7 DAYS ON AN EMPTY STOMACH WITH A FULL GLASS OF WATER    amLODipine  (NORVASC ) 10 MG tablet Take 1 tablet (10 mg total) by mouth daily.   atorvastatin  (LIPITOR) 20 MG tablet Take 1 tablet (20 mg total) by mouth daily.   cholecalciferol  (VITAMIN D3) 25 MCG (1000 UT) tablet Take 1,000 Units by mouth daily.   CRANBERRY FRUIT PO Take 1 tablet by mouth daily.   diclofenac  Sodium (VOLTAREN ) 1 % GEL APPLY 4 GRAMS TO AFFECTED AREA(S) 4 TIMES A DAY   ELIQUIS  5 MG TABS tablet Take 1 tablet (5 mg total) by mouth 2 (two) times daily.   Ferrous Sulfate  (IRON ) 325 (65 Fe) MG TABS Take 1 tablet  (325 mg total) by mouth daily.   fluticasone  (FLONASE ) 50 MCG/ACT nasal spray PLACE 2 SPRAYS INTO EACH NOSTRIL ONCE A DAY   hydrALAZINE  (APRESOLINE ) 25 MG tablet Take 1 tablet (25 mg total) by mouth 2 (two) times daily.   losartan  (COZAAR ) 100 MG tablet Take 1 tablet (100 mg total) by mouth daily.   Multiple Vitamins-Minerals (CENTRUM ADULT PO) Take 1 tablet by mouth daily.   Potassium Chloride  ER 20 MEQ TBCR Take 1 tablet (20 mEq total) by mouth daily. Take while taking hydrochlorothiazide /HCTZ   No current facility-administered medications for this visit. (Other)   REVIEW OF SYSTEMS:   ALLERGIES Allergies  Allergen Reactions   Lisinopril  Cough   PAST MEDICAL HISTORY Past Medical History:  Diagnosis Date   Arthritis    Cancer [facility])    Cataract    OU   Colon cancer (HCC)    Family history of pancreatic cancer 08/21/2020   Family history of uterine cancer 08/21/2020   Gastrointestinal hemorrhage    H/O cesarean section    Hx of tonsillectomy    Hyperlipidemia    Hypertension    Kidney disease    Macular degeneration    Exu ARMD OU   Paroxysmal atrial fibrillation (HCC) 07/05/2020   Stroke (cerebrum) (HCC) 07/05/2020   Urinary retention 04/15/2020  Past Surgical History:  Procedure Laterality Date   BIOPSY  04/06/2020   Procedure: BIOPSY;  Surgeon: Golda Claudis PENNER, MD;  Location: [facility];  Service: Endoscopy;;   CARPAL TUNNEL RELEASE Right    CATARACT EXTRACTION W/PHACO Left 10/11/2019   Procedure: CATARACT EXTRACTION PHACO AND INTRAOCULAR LENS PLACEMENT (IOC);  Surgeon: Harrie Agent, MD;  Location: [facility];  Service: Ophthalmology;  Laterality: Left;  CDE: 8.56   CATARACT EXTRACTION W/PHACO Right 10/25/2019   Procedure: CATARACT EXTRACTION PHACO AND INTRAOCULAR LENS PLACEMENT (IOC);  Surgeon: Harrie Agent, MD;  Location: [facility];  Service: Ophthalmology;  Laterality: Right;  CDE: 5.67   CESAREAN SECTION     COLONOSCOPY N/A 04/06/2020   Procedure: COLONOSCOPY;   Surgeon: Golda Claudis PENNER, MD;  Location: [facility];  Service: Endoscopy;  Laterality: N/A;   CYSTOSCOPY WITH BIOPSY N/A 04/08/2020   Procedure: CYSTOSCOPY WITH BIOPSY;  Surgeon: Sherrilee Belvie CROME, MD;  Location: [facility];  Service: Urology;  Laterality: N/A;   ESOPHAGOGASTRODUODENOSCOPY N/A 04/05/2020   Procedure: ESOPHAGOGASTRODUODENOSCOPY (EGD);  Surgeon: Golda Claudis PENNER, MD;  [facility];  Service: Endoscopy;  Laterality: N/A;   PARTIAL COLECTOMY N/A 04/08/2020   Procedure: PARTIAL COLECTOMY;  Surgeon: Kallie Manuelita BROCKS, MD;  Location: [facility];  Service: General;  Laterality: N/A;   PORTACATH PLACEMENT Left 05/18/2020   Procedure: INSERTION PORT-A-CATH (ATTACHED CATHETER IN LEFT SUBCLAVIAN);  Surgeon: Kallie Manuelita BROCKS, MD;  Location: [facility];  Service: General;  Laterality: Left;   TONSILLECTOMY     FAMILY HISTORY Family History  Problem Relation Age of Onset   Macular degeneration Mother    Stroke Mother    Hypertension Mother    Dementia Mother    Lung cancer Father        dx late 27s; smoking hx   Diabetes Sister    Hypertension Sister    Heart attack Brother    Cancer Maternal Aunt        ovarian or endometrial dx 58s   Leukemia Paternal Uncle        d. 4s   Pancreatic cancer Paternal Uncle        d. late 44s   SOCIAL HISTORY Social History   Tobacco Use   Smoking status: Never   Smokeless tobacco: Never  Vaping Use   Vaping status: Never Used  Substance Use Topics   Alcohol use: No   Drug use: No       OPHTHALMIC EXAM: Base Eye Exam     Visual Acuity (Snellen - Linear)       Right Left   Dist cc CF @ 3' 20/70   Dist ph cc  NI    Correction: Glasses         Tonometry (Tonopen, 8:08 AM)       Right Left   Pressure 21 18         Pupils       Pupils Dark Light Shape React APD   Right PERRL 3 2 Round Brisk None   Left PERRL 3 2 Round Brisk None         Visual Fields       Left Right    Full Full         Extraocular  Movement       Right Left    Full, Ortho Full, Ortho         Neuro/Psych     Oriented x3: Yes   Mood/Affect: Normal  Dilation     Both eyes: 1.0% Mydriacyl, 2.5% Phenylephrine  @ 8:09 AM           Slit Lamp and Fundus Exam     Slit Lamp Exam       Right Left   Lids/Lashes Dermatochalasis - upper lid Dermatochalasis - upper lid   Conjunctiva/Sclera 1+ Injection Trace Injection, +mucus   Cornea Arcus, 1-2+ fine Punctate epithelial erosions Arcus, 1-2+ Punctate epithelial erosions, mild tear film debris, trace endo pigment, high tear lake   Anterior Chamber Deep and quiet Deep and quiet   Iris Round and well dilated Round and well dilated   Lens PCIOL in good position, 1+ Posterior capsular opacification PC IOL in good postion with open PC   Anterior Vitreous Vitreous syneresis, Posterior vitreous detachment, vitreous condensations Vitreous syneresis, Posterior vitreous detachment, silicone oil micro drops         Fundus Exam       Right Left   Disc pink and sharp, compact mild pallor, sharp rim, Compact, vascular loops superiorly   C/D Ratio 0.2 0.2   Macula Blunted foveal reflex, large central GA, Drusen, Pigment clumping and atrophy, central thickening/pigmented disciform scar, +PED, persistent trace cystic changes overlying central scar, no heme Blunted foveal reflex, central thickening, RPE clumping and atrophy, Drusen, trace cystic changes overlying PED -- persistent, no frank heme   Vessels Vascular attenuation, Tortuous Vascular attenuation, Tortuous   Periphery Attached, mild Reticular degeneration Attached, mild Reticular degeneration           Refraction     Wearing Rx       Sphere Cylinder Add   Right [place] Sphere +3.50   Left -0.50 Sphere +3.50    Type: PAL           IMAGING AND PROCEDURES  Imaging and Procedures for 03/21/18  OCT, Retina - OU - Both Eyes       Right Eye Quality was good. Central Foveal Thickness: 446.  Progression has been stable. Findings include no SRF, abnormal foveal contour, retinal drusen , outer retinal tubulation, subretinal hyper-reflective material, disciform scar, epiretinal membrane, intraretinal fluid, pigment epithelial detachment, outer retinal atrophy (Persistent cystic changes overlying stable central SRHM / disciform scar).   Left Eye Quality was good. Central Foveal Thickness: 297. Progression has been stable. Findings include no SRF, abnormal foveal contour, retinal drusen , subretinal hyper-reflective material, intraretinal fluid, pigment epithelial detachment, outer retinal atrophy (Trace persistent cystic changes nasal fovea and macula overlying stable PED/SRHM).   Notes *Images captured and stored on drive  Diagnosis / Impression:  Exudative ARMD OU OD: persistent cystic changes overlying stable central SRHM / disciform scar  OS: trace persistent cystic changes nasal fovea and macula overlying stable PED/SRHM   Clinical management:  See below  Abbreviations: NFP - Normal foveal profile. CME - cystoid macular edema. PED - pigment epithelial detachment. IRF - intraretinal fluid. SRF - subretinal fluid. EZ - ellipsoid zone. ERM - epiretinal membrane. ORA - outer retinal atrophy. ORT - outer retinal tubulation. SRHM - subretinal hyper-reflective material      Intravitreal Injection, Pharmacologic Agent - OS - Left Eye       Time Out 01/08/2025. 8:21 AM. Confirmed correct patient, procedure, site, and patient consented.   Anesthesia Topical anesthesia was used. Anesthetic medications included Lidocaine  2%, Proparacaine 0.5%.   Procedure Preparation included 5% betadine  to ocular surface, eyelid speculum. A supplied (32g) needle was used.   Injection: 6 mg faricimab -svoa 6 MG/0.05ML  Route: Intravitreal, Site: Left Eye   NDC: [id number], Lot: [id number], Expiration date: 09/03/2025, Waste: 0 mL   Post-op Post injection exam found visual acuity of at least  counting fingers. The patient tolerated the procedure well. There were no complications. The patient received written and verbal post procedure care education. Post injection medications were not given.             ASSESSMENT/PLAN:    ICD-10-CM   1. Exudative age-related macular degeneration of both eyes with active choroidal neovascularization (HCC)  H35.3231 OCT, Retina - OU - Both Eyes    Intravitreal Injection, Pharmacologic Agent - OS - Left Eye    faricimab -svoa (VABYSMO ) 6mg /0.52mL intravitreal injection    2. Posterior vitreous detachment of both eyes  H43.813     3. Essential hypertension  I10     4. Hypertensive retinopathy of both eyes  H35.033     5. Pseudophakia of both eyes  Z96.1     6. Ocular hypertension, bilateral  H40.053     7. Dry eyes  H04.123      1. Exudative age-related macular degeneration, both eyes - severe exudative disease with very active CNVM OU at presentation in January 2019  - S/P IVA OD #1 (01.04.19), #2 (02.15.19)  - S/P IVA OS #1 (01.18.19), #2 (02.15.19)  - switched to Eylea  3.18.19 due to severity of disease  =================== - S/P IVE OD #1 (03.18.19), #2 (04.17.19), #3 (05.15.19), #4 (10.02.19), #5 (01.15.20), #6 (06.04.20), #7 (10.29.20), #8 (01.21.21), #9 (05.27.21), #10 (07.22.21) -- injections held due to stable disciform scar, #11 (08.12.24) for new IRH, #12 (09.25.24), #13 (11.06.24) -------------------------------- - S/P IVE OS #1 (03.18.19), #2 (04.17.19), #3 (05.15.19), #4 (06.12.19), #5 (07.10.19), #6 (08.07.19), #7 (09.04.19), #8 (10.02.19), #9 (11.06.19), #10 (12.11.19), #11 (01.15.20), #12 (02.20.20), #13 (03.26.20), #14 (04.30.20), #15 (06.04.20), #16 (07.16.20), # 17 (08.20.20), #18 (09.24.20), #19 (10.29.20), #20 (12.03.20), #21 (01.21.21), #22 (03.18.21), #23 (05.27.21), #24 (07.22.21), #25 (11.11.21), #26 (12.09.21), #27 (01.06.22), #28 (02.7.22), #29 (03.24.22), #30 (04.28.22), #31 (05.31.22), #32 (06.30.22), #33  (08.04.22), #34 (09.15.22), #35 (10.27.22), #36 (12.08.22), #37 (01.12.23), #38 (02.16.23), #39 (03.30.23), #40 (05.11.23), #41 (06.22.23), #42 (07.27.23), #43 (09.01.23), #44 (10.06.23), #45 (11.10.23), #46 (12.15.23)  -- IVE resistance ================= - s/p IVV OS #1 (01.12.24 sample), #2 (02.09.24), #3 (03.15.24), #4 (04.19.24), #5 (05.24.24), #6 (07.01.24), #7 (08.12.24), #8 (09.25.24), #9 (11.06.24), #10 (12.18.24), #11 (01.29.25), #12 (03.12.25), #13 (04.23.25) #14 (06.04.25), #15 (07.16.25), #16 (08.27.25), #17 (10.15.25), #18 (12.10.25) - IVE OS held from 7.22.21 to 11.11.21 due to TIA/stroke on 8.1.21 - OCT OD: persistent cystic changes overlying stable central SRHM / disciform scar; OS: trace persistent cystic changes nasal fovea and macula overlying stable PED/SRHM at 8 weeks - exam OD with focal IRH superior macula within atrophy -- improved  - BCVA OD stable at CF 3'; OS 20/70 stable - recommend IVV OS #19 today, 02.04.26 w/ f/u in 8 wks - IVV authorization obtained and Good Days reinstated - will hold IVE OD today -- pt in agreement - RBA of procedure discussed, questions answered - IVV informed consent obtained and signed on 02.04.26 - see procedure note  - [employer] Days funding reinstated 02.04.26 visit - f/u in 8 weeks, sooner prn for DFE/OCT/possible injection(s)  2. PVD / vitreous syneresis  - Discussed findings and prognosis   - No RT or RD on 360 exam  - Reviewed s/s of RT/RD  - strict return precautions for any such RT/RD symptoms  3,4. Hypertensive retinopathy OU -  exam shows persistent focal IRH OD within GA - discussed importance of tight BP control - monitor  5. Pseudophakia OU  - s/p CE/IOL (Dr. Harrie, 11.2020) - beautiful surgeries w/ IOLs in excellent position, doing well  - monitor  6. Ocular Hypertension OU  - IOP 17, 14  - continue cosopt  bid OU  7. Dry eyes OU - recommend artificial tears and lubricating ointment as needed  Ophthalmic Meds  Ordered this visit:  Meds ordered this encounter  Medications   faricimab -svoa (VABYSMO ) 6mg /0.52mL intravitreal injection     Return in about 8 weeks (around 03/05/2025) for f/u, Ex. AMD, DFE, OCT, Possible, IVV, OS.  There are no Patient Instructions on file for this visit.  This document serves as a record of services personally performed by Redell JUDITHANN Hans, MD, PhD. It was created on their behalf by Almetta Pesa, an ophthalmic technician. The creation of this record is the provider's dictation and/or activities during the visit.    Electronically signed by: Almetta Pesa, OA, 01/09/25  4:13 PM  This document serves as a record of services personally performed by Redell JUDITHANN Hans, MD, PhD. It was created on their behalf by Wanda GEANNIE Keens, COT an ophthalmic technician. The creation of this record is the provider's dictation and/or activities during the visit.    Electronically signed by:  Wanda GEANNIE Keens, COT  01/09/25 4:13 PM  Redell JUDITHANN Hans, M.D., Ph.D. Diseases & Surgery of the Retina and Vitreous [facility]  I have reviewed the above documentation for accuracy and completeness, and I agree with the above. Redell JUDITHANN Hans, M.D., Ph.D. 01/09/25 4:14 PM   Abbreviations: M myopia (nearsighted); A astigmatism; H hyperopia (farsighted); P presbyopia; Mrx spectacle prescription;  CTL contact lenses; OD right eye; OS left eye; OU both eyes  XT exotropia; ET esotropia; PEK punctate epithelial keratitis; PEE punctate epithelial erosions; DES dry eye syndrome; MGD meibomian gland dysfunction; ATs artificial tears; PFAT's preservative free artificial tears; NSC nuclear sclerotic cataract; PSC posterior subcapsular cataract; ERM epi-retinal membrane; PVD posterior vitreous detachment; RD retinal detachment; DM diabetes mellitus; DR diabetic retinopathy; NPDR non-proliferative diabetic retinopathy; PDR proliferative diabetic retinopathy; CSME clinically  significant macular edema; DME diabetic macular edema; dbh dot blot hemorrhages; CWS cotton wool spot; POAG primary open angle glaucoma; C/D cup-to-disc ratio; HVF humphrey visual field; GVF goldmann visual field; OCT optical coherence tomography; IOP intraocular pressure; BRVO Branch retinal vein occlusion; CRVO central retinal vein occlusion; CRAO central retinal artery occlusion; BRAO branch retinal artery occlusion; RT retinal tear; SB scleral buckle; PPV pars plana vitrectomy; VH Vitreous hemorrhage; PRP panretinal laser photocoagulation; IVK intravitreal kenalog; VMT vitreomacular traction; MH Macular hole;  NVD neovascularization of the disc; NVE neovascularization elsewhere; AREDS age related eye disease study; ARMD age related macular degeneration; POAG primary open angle glaucoma; EBMD epithelial/anterior basement membrane dystrophy; ACIOL anterior chamber intraocular lens; IOL intraocular lens; PCIOL posterior chamber intraocular lens; Phaco/IOL phacoemulsification with intraocular lens placement; PRK photorefractive keratectomy; LASIK laser assisted in situ keratomileusis; HTN hypertension; DM diabetes mellitus; COPD chronic obstructive pulmonary disease "

## 2025-01-07 ENCOUNTER — Inpatient Hospital Stay

## 2025-01-07 ENCOUNTER — Ambulatory Visit (HOSPITAL_COMMUNITY)
Admission: RE | Admit: 2025-01-07 | Discharge: 2025-01-07 | Disposition: A | Source: Ambulatory Visit | Attending: Hematology | Admitting: Hematology

## 2025-01-07 DIAGNOSIS — C189 Malignant neoplasm of colon, unspecified: Secondary | ICD-10-CM

## 2025-01-07 DIAGNOSIS — N1831 Chronic kidney disease, stage 3a: Secondary | ICD-10-CM

## 2025-01-07 LAB — CBC WITH DIFFERENTIAL/PLATELET
Abs Immature Granulocytes: 0.04 10*3/uL (ref 0.00–0.07)
Basophils Absolute: 0 10*3/uL (ref 0.0–0.1)
Basophils Relative: 0 %
Eosinophils Absolute: 0.2 10*3/uL (ref 0.0–0.5)
Eosinophils Relative: 2 %
HCT: 41.2 % (ref 36.0–46.0)
Hemoglobin: 13.6 g/dL (ref 12.0–15.0)
Immature Granulocytes: 1 %
Lymphocytes Relative: 23 %
Lymphs Abs: 1.6 10*3/uL (ref 0.7–4.0)
MCH: 30.6 pg (ref 26.0–34.0)
MCHC: 33 g/dL (ref 30.0–36.0)
MCV: 92.8 fL (ref 80.0–100.0)
Monocytes Absolute: 0.6 10*3/uL (ref 0.1–1.0)
Monocytes Relative: 8 %
Neutro Abs: 4.5 10*3/uL (ref 1.7–7.7)
Neutrophils Relative %: 66 %
Platelets: 156 10*3/uL (ref 150–400)
RBC: 4.44 MIL/uL (ref 3.87–5.11)
RDW: 13.3 % (ref 11.5–15.5)
WBC: 6.9 10*3/uL (ref 4.0–10.5)
nRBC: 0 % (ref 0.0–0.2)

## 2025-01-07 LAB — COMPREHENSIVE METABOLIC PANEL WITH GFR
ALT: 17 U/L (ref 0–44)
AST: 24 U/L (ref 15–41)
Albumin: 4.4 g/dL (ref 3.5–5.0)
Alkaline Phosphatase: 94 U/L (ref 38–126)
Anion gap: 11 (ref 5–15)
BUN: 13 mg/dL (ref 8–23)
CO2: 28 mmol/L (ref 22–32)
Calcium: 9.5 mg/dL (ref 8.9–10.3)
Chloride: 99 mmol/L (ref 98–111)
Creatinine, Ser: 0.98 mg/dL (ref 0.44–1.00)
GFR, Estimated: 59 mL/min — ABNORMAL LOW
Glucose, Bld: 87 mg/dL (ref 70–99)
Potassium: 3.3 mmol/L — ABNORMAL LOW (ref 3.5–5.1)
Sodium: 138 mmol/L (ref 135–145)
Total Bilirubin: 0.6 mg/dL (ref 0.0–1.2)
Total Protein: 7.7 g/dL (ref 6.5–8.1)

## 2025-01-07 LAB — POCT I-STAT CREATININE: Creatinine, Ser: 1.2 mg/dL — ABNORMAL HIGH (ref 0.44–1.00)

## 2025-01-07 MED ORDER — IOHEXOL 300 MG/ML  SOLN
100.0000 mL | Freq: Once | INTRAMUSCULAR | Status: AC | PRN
  Administered 2025-01-07: 100 mL via INTRAVENOUS

## 2025-01-07 MED ORDER — IOHEXOL 9 MG/ML PO SOLN
500.0000 mL | ORAL | Status: AC
  Administered 2025-01-07: 500 mL via ORAL

## 2025-01-07 MED ORDER — IOHEXOL 9 MG/ML PO SOLN
ORAL | Status: AC
  Filled 2025-01-07: qty 1000

## 2025-01-08 ENCOUNTER — Encounter (INDEPENDENT_AMBULATORY_CARE_PROVIDER_SITE_OTHER): Payer: Self-pay | Admitting: Ophthalmology

## 2025-01-08 ENCOUNTER — Ambulatory Visit (INDEPENDENT_AMBULATORY_CARE_PROVIDER_SITE_OTHER): Admitting: Ophthalmology

## 2025-01-08 DIAGNOSIS — H43813 Vitreous degeneration, bilateral: Secondary | ICD-10-CM

## 2025-01-08 DIAGNOSIS — H353231 Exudative age-related macular degeneration, bilateral, with active choroidal neovascularization: Secondary | ICD-10-CM

## 2025-01-08 DIAGNOSIS — H04123 Dry eye syndrome of bilateral lacrimal glands: Secondary | ICD-10-CM

## 2025-01-08 DIAGNOSIS — H40053 Ocular hypertension, bilateral: Secondary | ICD-10-CM | POA: Diagnosis not present

## 2025-01-08 DIAGNOSIS — I10 Essential (primary) hypertension: Secondary | ICD-10-CM

## 2025-01-08 DIAGNOSIS — Z961 Presence of intraocular lens: Secondary | ICD-10-CM

## 2025-01-08 DIAGNOSIS — H35033 Hypertensive retinopathy, bilateral: Secondary | ICD-10-CM

## 2025-01-08 LAB — CEA: CEA: 1.9 ng/mL (ref 0.0–4.7)

## 2025-01-08 MED ORDER — FARICIMAB-SVOA 6 MG/0.05ML IZ SOSY
6.0000 mg | PREFILLED_SYRINGE | INTRAVITREAL | Status: AC | PRN
  Administered 2025-01-08: 6 mg via INTRAVITREAL

## 2025-01-14 ENCOUNTER — Inpatient Hospital Stay: Admitting: Physician Assistant

## 2025-01-14 ENCOUNTER — Inpatient Hospital Stay

## 2025-02-03 ENCOUNTER — Ambulatory Visit: Admitting: Family Medicine

## 2025-03-05 ENCOUNTER — Encounter (INDEPENDENT_AMBULATORY_CARE_PROVIDER_SITE_OTHER): Admitting: Ophthalmology

## 2025-03-17 ENCOUNTER — Ambulatory Visit: Payer: Self-pay

## 2025-11-03 ENCOUNTER — Encounter: Admitting: Family Medicine
